# Patient Record
Sex: Male | Born: 1970 | Race: White | Hispanic: No | Smoking: Current every day smoker
Health system: Southern US, Community
[De-identification: ages and names within clinical notes are randomized; demographics above are authoritative.]

## PROBLEM LIST (undated history)

## (undated) DIAGNOSIS — F329 Major depressive disorder, single episode, unspecified: Secondary | ICD-10-CM

## (undated) DIAGNOSIS — I2510 Atherosclerotic heart disease of native coronary artery without angina pectoris: Secondary | ICD-10-CM

## (undated) DIAGNOSIS — I509 Heart failure, unspecified: Secondary | ICD-10-CM

## (undated) DIAGNOSIS — R0600 Dyspnea, unspecified: Secondary | ICD-10-CM

## (undated) DIAGNOSIS — R079 Chest pain, unspecified: Secondary | ICD-10-CM

## (undated) DIAGNOSIS — R519 Headache, unspecified: Secondary | ICD-10-CM

## (undated) DIAGNOSIS — E039 Hypothyroidism, unspecified: Secondary | ICD-10-CM

## (undated) DIAGNOSIS — I639 Cerebral infarction, unspecified: Secondary | ICD-10-CM

## (undated) DIAGNOSIS — G4730 Sleep apnea, unspecified: Secondary | ICD-10-CM

## (undated) DIAGNOSIS — I255 Ischemic cardiomyopathy: Secondary | ICD-10-CM

## (undated) DIAGNOSIS — Z720 Tobacco use: Secondary | ICD-10-CM

## (undated) DIAGNOSIS — R112 Nausea with vomiting, unspecified: Secondary | ICD-10-CM

## (undated) DIAGNOSIS — I3139 Other pericardial effusion (noninflammatory): Secondary | ICD-10-CM

## (undated) DIAGNOSIS — I10 Essential (primary) hypertension: Secondary | ICD-10-CM

## (undated) DIAGNOSIS — Z9889 Other specified postprocedural states: Secondary | ICD-10-CM

## (undated) DIAGNOSIS — K219 Gastro-esophageal reflux disease without esophagitis: Secondary | ICD-10-CM

## (undated) DIAGNOSIS — F32A Depression, unspecified: Secondary | ICD-10-CM

## (undated) DIAGNOSIS — E785 Hyperlipidemia, unspecified: Secondary | ICD-10-CM

## (undated) DIAGNOSIS — E559 Vitamin D deficiency, unspecified: Secondary | ICD-10-CM

## (undated) DIAGNOSIS — J45909 Unspecified asthma, uncomplicated: Secondary | ICD-10-CM

## (undated) DIAGNOSIS — I313 Pericardial effusion (noninflammatory): Secondary | ICD-10-CM

## (undated) DIAGNOSIS — Z8739 Personal history of other diseases of the musculoskeletal system and connective tissue: Secondary | ICD-10-CM

## (undated) DIAGNOSIS — Z87442 Personal history of urinary calculi: Secondary | ICD-10-CM

## (undated) DIAGNOSIS — E119 Type 2 diabetes mellitus without complications: Secondary | ICD-10-CM

## (undated) DIAGNOSIS — N200 Calculus of kidney: Secondary | ICD-10-CM

## (undated) DIAGNOSIS — R51 Headache: Secondary | ICD-10-CM

## (undated) HISTORY — DX: Vitamin D deficiency, unspecified: E55.9

## (undated) HISTORY — DX: Personal history of urinary calculi: Z87.442

## (undated) HISTORY — PX: KIDNEY STONE SURGERY: SHX686

## (undated) HISTORY — DX: Hyperlipidemia, unspecified: E78.5

## (undated) HISTORY — DX: Chest pain, unspecified: R07.9

## (undated) HISTORY — DX: Pericardial effusion (noninflammatory): I31.3

## (undated) HISTORY — PX: HERNIA REPAIR: SHX51

## (undated) HISTORY — DX: Gastro-esophageal reflux disease without esophagitis: K21.9

## (undated) HISTORY — PX: PERCUTANEOUS NEPHROLITHOTRIPSY: SHX2206

## (undated) HISTORY — DX: Hypothyroidism, unspecified: E03.9

## (undated) HISTORY — PX: APPENDECTOMY: SHX54

## (undated) HISTORY — DX: Personal history of other diseases of the musculoskeletal system and connective tissue: Z87.39

## (undated) HISTORY — DX: Other pericardial effusion (noninflammatory): I31.39

## (undated) HISTORY — DX: Cerebral infarction, unspecified: I63.9

## (undated) HISTORY — DX: Essential (primary) hypertension: I10

## (undated) HISTORY — PX: VARICOCELECTOMY: SHX1084

## (undated) HISTORY — DX: Ischemic cardiomyopathy: I25.5

## (undated) HISTORY — DX: Tobacco use: Z72.0

---

## 2000-06-20 ENCOUNTER — Encounter: Payer: Self-pay | Admitting: Internal Medicine

## 2000-06-20 ENCOUNTER — Ambulatory Visit (HOSPITAL_COMMUNITY): Admission: RE | Admit: 2000-06-20 | Discharge: 2000-06-20 | Payer: Self-pay | Admitting: Internal Medicine

## 2000-07-18 ENCOUNTER — Emergency Department (HOSPITAL_COMMUNITY): Admission: EM | Admit: 2000-07-18 | Discharge: 2000-07-18 | Payer: Self-pay | Admitting: Emergency Medicine

## 2006-05-11 ENCOUNTER — Ambulatory Visit: Payer: Self-pay | Admitting: Cardiology

## 2006-05-14 ENCOUNTER — Ambulatory Visit: Payer: Self-pay | Admitting: Cardiology

## 2012-01-15 DIAGNOSIS — K219 Gastro-esophageal reflux disease without esophagitis: Secondary | ICD-10-CM | POA: Insufficient documentation

## 2012-01-15 DIAGNOSIS — N179 Acute kidney failure, unspecified: Secondary | ICD-10-CM | POA: Diagnosis present

## 2012-01-15 DIAGNOSIS — M6282 Rhabdomyolysis: Secondary | ICD-10-CM | POA: Insufficient documentation

## 2012-01-15 DIAGNOSIS — E039 Hypothyroidism, unspecified: Secondary | ICD-10-CM | POA: Insufficient documentation

## 2012-03-13 DIAGNOSIS — N200 Calculus of kidney: Secondary | ICD-10-CM

## 2012-03-13 HISTORY — DX: Calculus of kidney: N20.0

## 2012-08-18 ENCOUNTER — Encounter: Payer: Self-pay | Admitting: Physician Assistant

## 2012-08-18 DIAGNOSIS — R079 Chest pain, unspecified: Secondary | ICD-10-CM

## 2012-08-19 ENCOUNTER — Encounter: Payer: Self-pay | Admitting: Physician Assistant

## 2012-08-19 ENCOUNTER — Encounter: Payer: Self-pay | Admitting: Cardiology

## 2012-08-19 DIAGNOSIS — R079 Chest pain, unspecified: Secondary | ICD-10-CM

## 2012-08-23 ENCOUNTER — Encounter: Payer: Self-pay | Admitting: Physician Assistant

## 2012-08-23 DIAGNOSIS — R079 Chest pain, unspecified: Secondary | ICD-10-CM

## 2012-08-27 ENCOUNTER — Telehealth: Payer: Self-pay | Admitting: Physician Assistant

## 2012-08-27 NOTE — Telephone Encounter (Signed)
Needs release to go to back to work.  Was told by [facility] nurse that he can not return to work until we release him

## 2012-08-28 ENCOUNTER — Encounter: Payer: Self-pay | Admitting: *Deleted

## 2012-08-28 NOTE — Telephone Encounter (Signed)
Notes Recorded by Lesle Chris, LPN on 1/61/0960 at 10:00 AM Patient notified. Rescheduled eph from 6/27 to 6/19 as patient is anxious to get back to work. Cancellation on PA schedule so able to move appt up. ------  Notes Recorded by Lesle Chris, LPN on 4/54/0981 at 2:17 PM Attempted to notify patient, unable to leave message. Has follow up OV scheduled for 6/27 with Gene Serpe, PA. ------  Notes Recorded by Prescott Parma, PA-C on 08/26/2012 at 12:06 PM Stress test intermediate risk. Needs to be reviewed at post hospital f/u OV

## 2012-08-29 ENCOUNTER — Encounter: Payer: Self-pay | Admitting: *Deleted

## 2012-08-29 ENCOUNTER — Encounter: Payer: Self-pay | Admitting: Physician Assistant

## 2012-08-29 ENCOUNTER — Ambulatory Visit (INDEPENDENT_AMBULATORY_CARE_PROVIDER_SITE_OTHER): Payer: BC Managed Care – PPO | Admitting: Physician Assistant

## 2012-08-29 VITALS — BP 121/81 | HR 72 | Ht 78.0 in | Wt 291.0 lb

## 2012-08-29 DIAGNOSIS — Z9189 Other specified personal risk factors, not elsewhere classified: Secondary | ICD-10-CM

## 2012-08-29 DIAGNOSIS — R9439 Abnormal result of other cardiovascular function study: Secondary | ICD-10-CM

## 2012-08-29 DIAGNOSIS — E782 Mixed hyperlipidemia: Secondary | ICD-10-CM | POA: Insufficient documentation

## 2012-08-29 DIAGNOSIS — I10 Essential (primary) hypertension: Secondary | ICD-10-CM | POA: Insufficient documentation

## 2012-08-29 DIAGNOSIS — F17210 Nicotine dependence, cigarettes, uncomplicated: Secondary | ICD-10-CM | POA: Insufficient documentation

## 2012-08-29 DIAGNOSIS — Z720 Tobacco use: Secondary | ICD-10-CM

## 2012-08-29 DIAGNOSIS — I3139 Other pericardial effusion (noninflammatory): Secondary | ICD-10-CM

## 2012-08-29 DIAGNOSIS — E785 Hyperlipidemia, unspecified: Secondary | ICD-10-CM

## 2012-08-29 DIAGNOSIS — I313 Pericardial effusion (noninflammatory): Secondary | ICD-10-CM | POA: Insufficient documentation

## 2012-08-29 DIAGNOSIS — R931 Abnormal findings on diagnostic imaging of heart and coronary circulation: Secondary | ICD-10-CM

## 2012-08-29 DIAGNOSIS — F17200 Nicotine dependence, unspecified, uncomplicated: Secondary | ICD-10-CM

## 2012-08-29 DIAGNOSIS — R079 Chest pain, unspecified: Secondary | ICD-10-CM

## 2012-08-29 DIAGNOSIS — Z8739 Personal history of other diseases of the musculoskeletal system and connective tissue: Secondary | ICD-10-CM | POA: Insufficient documentation

## 2012-08-29 DIAGNOSIS — I319 Disease of pericardium, unspecified: Secondary | ICD-10-CM

## 2012-08-29 NOTE — Assessment & Plan Note (Signed)
Following review of the recent, equivocal pharmacologic nuclear imaging result with Dr. Shirlee Latch, recommendation is as follows: patient will be referred for a cardiac CT angiogram with calcium score, at [facility]. We feel that this would be a better followup study to definitively exclude significant CAD, rather than subject the patient to a cardiac catheterization. This is in light of his atypical CP symptoms, but in the context of multiple CRFs, including ongoing tobacco smoking. If this study is negative, then no further workup is indicated and patient can return to our clinic on an as-needed basis. If, however, there is suggestion of significant CAD, then we will have him return to discuss proceeding with a cardiac catheterization. In the meanwhile, patient is cleared to return to work full-time, without restriction.

## 2012-08-29 NOTE — Patient Instructions (Signed)
   Coronary CTA - to be scheduled at [facility] will contact with results via phone or letter.   Continue all current medications. Patient may return to work.   Follow up as needed

## 2012-08-29 NOTE — Assessment & Plan Note (Signed)
Resolved, by recent echocardiogram

## 2012-08-29 NOTE — Progress Notes (Signed)
Primary Cardiologist: Rollene Rotunda, MD (new)   HPI: Post hospital followup from [facility], following recent presentation with atypical CP. Troponins NL. He presented with no known CAD, but with multiple CRFs, and had a negative stress echocardiogram in 2008.    - Echocardiogram, June 9: EF 50-55%; question inferobasal/inferoseptal HK; mild MR  Following this study, recommendation was to proceed with an outpatient stress test.   Marlane Hatcher, June 13: Intermediate risk study, namely based on calculated EF (EF 37%); physiologically inconsistent reversible basal anterior defect, also fixed inferoseptal defect at apex   He presents today reporting no recurrent severe CP, which led to his recent hospitalization. He has had, however, one singular episode of CP, but this was while riding on his tractor mower. It was minimally intense and persisted for about one hour. He did not use NTG. Of note, although he is on ranitidine, he states that he has heartburn "all the time". And as recently documented, he continues to deny any history of exertional CP.  Allergies  Allergen Reactions  . Crestor (Rosuvastatin) Other (See Comments)    rhabdomyolosis    Current Outpatient Prescriptions  Medication Sig Dispense Refill  . aspirin EC 81 MG tablet Take 81 mg by mouth daily.      . cholecalciferol (VITAMIN D) 1000 UNITS tablet Take 1,000 Units by mouth daily.      Marland Kitchen levothyroxine (SYNTHROID, LEVOTHROID) 200 MCG tablet Take 200 mcg by mouth daily before breakfast.      . ranitidine (ZANTAC) 75 MG tablet Take 150 mg by mouth at bedtime.       No current facility-administered medications for this visit.    Past Medical History  Diagnosis Date  . Hypertension   . Hyperlipidemia   . GERD (gastroesophageal reflux disease)   . Hypothyroidism   . History of rhabdomyolysis   . History of kidney stones   . Vitamin D deficiency   . Chest pain   . Tobacco abuse   . Pericardial effusion     Small, by  dobutamine echocardiogram, 04/2006    Past Surgical History  Procedure Laterality Date  . Varicocelectomy    . Percutaneous nephrolithotripsy      History   Social History  . Marital Status: Divorced    Spouse Name: N/A    Number of Children: N/A  . Years of Education: N/A   Occupational History  . MAINTENANCE TECHNICIAN     [employer] [place]   Social History Main Topics  . Smoking status: Current Every Day Smoker -- 0.50 packs/day for 26 years    Types: Cigarettes    Start date: 03/13/1985  . Smokeless tobacco: Former Neurosurgeon    Types: Snuff, Dorna Bloom    Quit date: 03/14/1987     Comment: started smoking at age 66. chewed and dipped for 2 years  . Alcohol Use: Not on file  . Drug Use: Not on file  . Sexually Active: Not on file   Other Topics Concern  . Not on file   Social History Narrative   Eats fast food daily   No exercise outside of work   Social History Narrative   Eats fast food daily   No exercise outside of work    Problem Relation Age of Onset  . Coronary artery disease Father   . Coronary artery disease Mother   . Coronary artery disease Maternal Grandfather     both grandfathers and several uncles  . Coronary artery disease    .  Hypertension Father   . Hyperlipidemia Father   . Diabetes Father   . Diabetes Mother   . Diabetes Sister     both sisters  . Hypertension Mother   . Hyperlipidemia Mother     ROS: no nausea, vomiting; no fever, chills; no melena, hematochezia; no claudication  PHYSICAL EXAM: BP 121/81  Pulse 72  Ht 6\' 6"  (1.981 m)  Wt 291 lb (131.997 kg)  BMI 33.64 kg/m2 GENERAL: 42 year-old male, obese; NAD HEENT: NCAT, PERRLA, EOMI; sclera clear; no xanthelasma NECK: palpable bilateral carotid pulses, no bruits; no JVD; no TM LUNGS: CTA bilaterally CARDIAC: RRR (S1, S2); no significant murmurs; no rubs or gallops ABDOMEN: soft, protuberant EXTREMETIES: no significant peripheral edema SKIN: warm/dry; no obvious  rash/lesions MUSCULOSKELETAL: no joint deformity NEURO: no focal deficit; NL affect   EKG:    ASSESSMENT & PLAN:  Chest pain Following review of the recent, equivocal pharmacologic nuclear imaging result with Dr. Shirlee Latch, recommendation is as follows: patient will be referred for a cardiac CT angiogram with calcium score, at [facility]. We feel that this would be a better followup study to definitively exclude significant CAD, rather than subject the patient to a cardiac catheterization. This is in light of his atypical CP symptoms, but in the context of multiple CRFs, including ongoing tobacco smoking. If this study is negative, then no further workup is indicated and patient can return to our clinic on an as-needed basis. If, however, there is suggestion of significant CAD, then we will have him return to discuss proceeding with a cardiac catheterization. In the meanwhile, patient is cleared to return to work full-time, without restriction.  Pericardial effusion Resolved, by recent echocardiogram    Gene Roza Creamer, PAC

## 2012-09-02 ENCOUNTER — Telehealth: Payer: Self-pay | Admitting: *Deleted

## 2012-09-02 NOTE — Telephone Encounter (Signed)
Left message for patient to call and schedule Cardiac CT.

## 2012-09-03 ENCOUNTER — Encounter: Payer: Self-pay | Admitting: *Deleted

## 2012-09-03 NOTE — Telephone Encounter (Signed)
Cardiac CT scheduled for 09/27/12 at 1 pm.

## 2012-09-06 ENCOUNTER — Ambulatory Visit: Payer: Self-pay | Admitting: Physician Assistant

## 2012-09-10 ENCOUNTER — Emergency Department (HOSPITAL_COMMUNITY): Payer: Worker's Compensation

## 2012-09-10 ENCOUNTER — Encounter (HOSPITAL_COMMUNITY): Payer: Self-pay | Admitting: *Deleted

## 2012-09-10 ENCOUNTER — Emergency Department (HOSPITAL_COMMUNITY)
Admission: EM | Admit: 2012-09-10 | Discharge: 2012-09-11 | Disposition: A | Discharge status: Home / Self Care | Payer: Worker's Compensation | Attending: Emergency Medicine | Admitting: Emergency Medicine

## 2012-09-10 DIAGNOSIS — Z8639 Personal history of other endocrine, nutritional and metabolic disease: Secondary | ICD-10-CM | POA: Insufficient documentation

## 2012-09-10 DIAGNOSIS — Z9189 Other specified personal risk factors, not elsewhere classified: Secondary | ICD-10-CM | POA: Insufficient documentation

## 2012-09-10 DIAGNOSIS — Z862 Personal history of diseases of the blood and blood-forming organs and certain disorders involving the immune mechanism: Secondary | ICD-10-CM | POA: Insufficient documentation

## 2012-09-10 DIAGNOSIS — E039 Hypothyroidism, unspecified: Secondary | ICD-10-CM | POA: Insufficient documentation

## 2012-09-10 DIAGNOSIS — K429 Umbilical hernia without obstruction or gangrene: Secondary | ICD-10-CM | POA: Insufficient documentation

## 2012-09-10 DIAGNOSIS — K219 Gastro-esophageal reflux disease without esophagitis: Secondary | ICD-10-CM | POA: Insufficient documentation

## 2012-09-10 DIAGNOSIS — Z79899 Other long term (current) drug therapy: Secondary | ICD-10-CM | POA: Insufficient documentation

## 2012-09-10 DIAGNOSIS — Z8679 Personal history of other diseases of the circulatory system: Secondary | ICD-10-CM | POA: Insufficient documentation

## 2012-09-10 DIAGNOSIS — Z87448 Personal history of other diseases of urinary system: Secondary | ICD-10-CM | POA: Insufficient documentation

## 2012-09-10 DIAGNOSIS — Z7982 Long term (current) use of aspirin: Secondary | ICD-10-CM | POA: Insufficient documentation

## 2012-09-10 DIAGNOSIS — I10 Essential (primary) hypertension: Secondary | ICD-10-CM | POA: Insufficient documentation

## 2012-09-10 DIAGNOSIS — F17200 Nicotine dependence, unspecified, uncomplicated: Secondary | ICD-10-CM | POA: Insufficient documentation

## 2012-09-10 HISTORY — PX: CARDIAC CATHETERIZATION: SHX172

## 2012-09-10 LAB — CBC WITH DIFFERENTIAL/PLATELET
Basophils Relative: 0 % (ref 0–1)
Eosinophils Absolute: 0.2 10*3/uL (ref 0.0–0.7)
Eosinophils Relative: 2 % (ref 0–5)
HCT: 45.3 % (ref 39.0–52.0)
Lymphocytes Relative: 28 % (ref 12–46)
Lymphs Abs: 3 10*3/uL (ref 0.7–4.0)
MCV: 88.3 fL (ref 78.0–100.0)
Monocytes Relative: 6 % (ref 3–12)
Neutro Abs: 6.6 10*3/uL (ref 1.7–7.7)
Platelets: 258 10*3/uL (ref 150–400)
RBC: 5.13 MIL/uL (ref 4.22–5.81)
RDW: 15.3 % (ref 11.5–15.5)
WBC: 10.5 10*3/uL (ref 4.0–10.5)

## 2012-09-10 LAB — COMPREHENSIVE METABOLIC PANEL
Albumin: 4 g/dL (ref 3.5–5.2)
Alkaline Phosphatase: 103 U/L (ref 39–117)
BUN: 16 mg/dL (ref 6–23)
Calcium: 10.1 mg/dL (ref 8.4–10.5)
Creatinine, Ser: 1.26 mg/dL (ref 0.50–1.35)
Potassium: 3.6 mEq/L (ref 3.5–5.1)
Total Protein: 7.7 g/dL (ref 6.0–8.3)

## 2012-09-10 MED ORDER — IOHEXOL 300 MG/ML  SOLN
100.0000 mL | Freq: Once | INTRAMUSCULAR | Status: AC | PRN
  Administered 2012-09-10: 100 mL via INTRAVENOUS

## 2012-09-10 MED ORDER — HYDROCODONE-ACETAMINOPHEN 5-325 MG PO TABS: 1.0000 | ORAL_TABLET | Freq: Four times a day (QID) | ORAL | Status: DC | PRN

## 2012-09-10 MED ORDER — HYDROMORPHONE HCL PF 1 MG/ML IJ SOLN
1.0000 mg | Freq: Once | INTRAMUSCULAR | Status: AC
  Administered 2012-09-10: 1 mg via INTRAVENOUS
  Filled 2012-09-10: qty 1

## 2012-09-10 MED ORDER — IOHEXOL 300 MG/ML  SOLN
50.0000 mL | Freq: Once | INTRAMUSCULAR | Status: AC | PRN
  Administered 2012-09-10: 50 mL via ORAL

## 2012-09-10 MED ORDER — ONDANSETRON HCL 4 MG/2ML IJ SOLN
4.0000 mg | Freq: Once | INTRAMUSCULAR | Status: AC
  Administered 2012-09-10: 4 mg via INTRAVENOUS
  Filled 2012-09-10: qty 2

## 2012-09-10 NOTE — ED Notes (Signed)
Pt reports lifting and feeling a pull in abdomen.  Reporting burning pain around umbilicus, also reporting a knot has developed.

## 2012-09-10 NOTE — ED Provider Notes (Signed)
History     This chart was scribed for Devin Lennert, MD, MD by Smitty Pluck, ED Scribe. The patient was seen in room APA10/APA10 and the patient's care was started at 8:17 PM.  CSN: [id number] Arrival date & time 09/10/12  1914    Chief Complaint  Patient presents with  . Abdominal Pain   Patient is a 42 y.o. male presenting with abdominal pain. The history is provided by the patient and medical records. No language interpreter was used.  Abdominal Pain This is a new problem. The current episode started 1 to 2 hours ago. The problem occurs constantly. The problem has not changed since onset.Associated symptoms include abdominal pain. Pertinent negatives include no chest pain and no headaches. The symptoms are aggravated by bending and twisting. Nothing relieves the symptoms. He has tried nothing for the symptoms.   HPI Comments: [name]. is a 42 y.o. male who presents to the Emergency Department complaining of constant, moderate, burning, lower abdominal pain onset today. Pt reports that he was working and lifted a heavy object and felt pulling sensation in abdomen. He states he feels there is a a knot on his abdomen. Pt denies fever, chills, nausea, vomiting, diarrhea, weakness, cough, SOB and any other pain.   Past Medical History  Diagnosis Date  . Hypertension   . Hyperlipidemia   . GERD (gastroesophageal reflux disease)   . Hypothyroidism   . History of rhabdomyolysis   . History of kidney stones   . Vitamin D deficiency   . Chest pain   . Tobacco abuse   . Pericardial effusion     Small, by dobutamine echocardiogram, 04/2006   Past Surgical History  Procedure Laterality Date  . Varicocelectomy    . Percutaneous nephrolithotripsy     Family History  Problem Relation Age of Onset  . Coronary artery disease Father   . Coronary artery disease Mother   . Coronary artery disease Maternal Grandfather     both grandfathers and several uncles  . Coronary artery  disease    . Hypertension Father   . Hyperlipidemia Father   . Diabetes Father   . Diabetes Mother   . Diabetes Sister     both sisters  . Hypertension Mother   . Hyperlipidemia Mother    History  Substance Use Topics  . Smoking status: Current Every Day Smoker -- 0.50 packs/day for 26 years    Types: Cigarettes    Start date: 03/13/1985  . Smokeless tobacco: Former Neurosurgeon    Types: Snuff, Dorna Bloom    Quit date: 03/14/1987     Comment: started smoking at age 41. chewed and dipped for 2 years  . Alcohol Use: No    Review of Systems  Constitutional: Negative for appetite change and fatigue.  HENT: Negative for congestion, sinus pressure and ear discharge.   Eyes: Negative for discharge.  Respiratory: Negative for cough.   Cardiovascular: Negative for chest pain.  Gastrointestinal: Positive for abdominal pain. Negative for diarrhea.  Genitourinary: Negative for frequency and hematuria.  Musculoskeletal: Negative for back pain.  Skin: Negative for rash.  Neurological: Negative for seizures and headaches.  Psychiatric/Behavioral: Negative for hallucinations.    Allergies  Crestor  Home Medications   Current Outpatient Rx  Name  Route  Sig  Dispense  Refill  . aspirin EC 81 MG tablet   Oral   Take 81 mg by mouth daily.         . cholecalciferol (VITAMIN  D) 1000 UNITS tablet   Oral   Take 1,000 Units by mouth daily.         Marland Kitchen levothyroxine (SYNTHROID, LEVOTHROID) 200 MCG tablet   Oral   Take 200 mcg by mouth daily before breakfast.         . ranitidine (ZANTAC) 75 MG tablet   Oral   Take 150 mg by mouth at bedtime.          BP 154/109  Pulse 91  Temp(Src) 98.7 F (37.1 C) (Oral)  Resp 20  Ht 6\' 6"  (1.981 m)  Wt 300 lb (136.079 kg)  BMI 34.68 kg/m2  SpO2 98% Physical Exam  Nursing note and vitals reviewed. Constitutional: He is oriented to person, place, and time. He appears well-developed.  HENT:  Head: Normocephalic.  Eyes: Conjunctivae and EOM  are normal. No scleral icterus.  Neck: Neck supple. No thyromegaly present.  Cardiovascular: Normal rate and regular rhythm.  Exam reveals no gallop and no friction rub.   No murmur heard. Pulmonary/Chest: No stridor. He has no wheezes. He has no rales. He exhibits no tenderness.  Abdominal: He exhibits no distension. There is tenderness. There is no rebound.  Musculoskeletal: Normal range of motion. He exhibits no edema.  Lymphadenopathy:    He has no cervical adenopathy.  Neurological: He is oriented to person, place, and time. Coordination normal.  Skin: No rash noted. No erythema.  Psychiatric: He has a normal mood and affect. His behavior is normal.    ED Course  Procedures (including critical care time) DIAGNOSTIC STUDIES: Oxygen Saturation is 98% on room air, normal by my interpretation.    COORDINATION OF CARE: 8:20 PM Discussed ED treatment with pt and pt agrees.  Medications  HYDROmorphone (DILAUDID) injection 1 mg (1 mg Intravenous Given 09/10/12 2142)  ondansetron (ZOFRAN) injection 4 mg (4 mg Intravenous Given 09/10/12 2142)  iohexol (OMNIPAQUE) 300 MG/ML solution 50 mL (50 mLs Oral Contrast Given 09/10/12 2053)  iohexol (OMNIPAQUE) 300 MG/ML solution 100 mL (100 mLs Intravenous Contrast Given 09/10/12 2153)    10:45 PM Recheck: Discussed lab results and treatment course with pt. Pt is ready for discharge.    Labs Reviewed  COMPREHENSIVE METABOLIC PANEL - Abnormal; Notable for the following:    Glucose, Bld 113 (*)    GFR calc non Af Amer 70 (*)    GFR calc Af Amer 81 (*)    All other components within normal limits  CBC WITH DIFFERENTIAL   Ct Abdomen Pelvis W Contrast  09/10/2012   *RADIOLOGY REPORT*  Clinical Data: Periumbilical pain after lifting.  CT ABDOMEN AND PELVIS WITH CONTRAST  Technique:  Multidetector CT imaging of the abdomen and pelvis was performed following the standard protocol during bolus administration of intravenous contrast.  Contrast: 50mL OMNIPAQUE  IOHEXOL 300 MG/ML  SOLN, OMNIPAQUE IOHEXOL 300 MG/ML  SOLN  Comparison: None.  Findings: Bibasilar atelectasis is noted.  There is no pleural or pericardial effusion.  The liver is diffusely low attenuating consistent with fatty infiltration.  No focal liver lesion is identified.  Stones and sludge are seen within the gallbladder but there is no evidence of acute cholecystitis by CT scan.  The liver, adrenal glands, pancreas and kidneys appear normal.  The patient has a very small fat containing umbilical hernia.  The stomach, small and large bowel and appendix all appear normal.  No focal bony abnormality is identified.  IMPRESSION:  1.  No acute finding. 2.  Fatty infiltration of  the liver. 3.  Gallstones and gallbladder sludge. 4.  Very small fat containing umbilical hernia.   Original Report Authenticated By: Holley Dexter, M.D.   No diagnosis found.  MDM  The chart was scribed for me under my direct supervision.  I personally performed the history, physical, and medical decision making and all procedures in the evaluation of this patient.Devin Lennert, MD 09/10/12 2249

## 2012-09-19 ENCOUNTER — Encounter (HOSPITAL_COMMUNITY): Payer: Self-pay | Admitting: Emergency Medicine

## 2012-09-19 ENCOUNTER — Emergency Department (HOSPITAL_COMMUNITY)
Admission: EM | Admit: 2012-09-19 | Discharge: 2012-09-19 | Disposition: A | Discharge status: Home / Self Care | Payer: BC Managed Care – PPO | Attending: Emergency Medicine | Admitting: Emergency Medicine

## 2012-09-19 DIAGNOSIS — Z87891 Personal history of nicotine dependence: Secondary | ICD-10-CM | POA: Insufficient documentation

## 2012-09-19 DIAGNOSIS — Z8679 Personal history of other diseases of the circulatory system: Secondary | ICD-10-CM | POA: Insufficient documentation

## 2012-09-19 DIAGNOSIS — IMO0001 Reserved for inherently not codable concepts without codable children: Secondary | ICD-10-CM | POA: Insufficient documentation

## 2012-09-19 DIAGNOSIS — K429 Umbilical hernia without obstruction or gangrene: Secondary | ICD-10-CM

## 2012-09-19 DIAGNOSIS — Z79899 Other long term (current) drug therapy: Secondary | ICD-10-CM | POA: Insufficient documentation

## 2012-09-19 DIAGNOSIS — R110 Nausea: Secondary | ICD-10-CM | POA: Insufficient documentation

## 2012-09-19 DIAGNOSIS — I10 Essential (primary) hypertension: Secondary | ICD-10-CM | POA: Insufficient documentation

## 2012-09-19 DIAGNOSIS — K219 Gastro-esophageal reflux disease without esophagitis: Secondary | ICD-10-CM | POA: Insufficient documentation

## 2012-09-19 DIAGNOSIS — E039 Hypothyroidism, unspecified: Secondary | ICD-10-CM | POA: Insufficient documentation

## 2012-09-19 DIAGNOSIS — E559 Vitamin D deficiency, unspecified: Secondary | ICD-10-CM | POA: Insufficient documentation

## 2012-09-19 DIAGNOSIS — E785 Hyperlipidemia, unspecified: Secondary | ICD-10-CM | POA: Insufficient documentation

## 2012-09-19 DIAGNOSIS — R109 Unspecified abdominal pain: Secondary | ICD-10-CM | POA: Insufficient documentation

## 2012-09-19 DIAGNOSIS — Z87442 Personal history of urinary calculi: Secondary | ICD-10-CM | POA: Insufficient documentation

## 2012-09-19 MED ORDER — ONDANSETRON HCL 4 MG PO TABS: 4.0000 mg | ORAL_TABLET | Freq: Four times a day (QID) | ORAL | Status: DC

## 2012-09-19 MED ORDER — METHOCARBAMOL 500 MG PO TABS: 500.0000 mg | ORAL_TABLET | Freq: Three times a day (TID) | ORAL | Status: DC

## 2012-09-19 MED ORDER — HYDROCODONE-ACETAMINOPHEN 7.5-325 MG PO TABS: 1.0000 | ORAL_TABLET | ORAL | Status: DC | PRN

## 2012-09-19 NOTE — ED Notes (Signed)
Pt is alert, and NAD, has already been examined by Loney Laurence, PA.  Given gingerale to drink.

## 2012-09-19 NOTE — ED Provider Notes (Signed)
History    CSN: [id number] Arrival date & time 09/19/12  4098  First MD Initiated Contact with Patient 09/19/12 1951     Chief Complaint  Patient presents with  . Hernia   (Consider location/radiation/quality/duration/timing/severity/associated sxs/prior Treatment) HPI Comments: Devin [name] is a 42 year old patient who presents to the emergency department with complaint of abdominal hernia. The patient states he does a lot of heavy lifting, and on or about July 1 he felt a pulling sensation followed by a knot in his abdomen and nausea. The patient was evaluated with a CT scan in the emergency department there were no acute findings appreciated. The patient was referred to surgery and has an appointment to see the surgeon on July 15. The patient states he presents tonight because he is having pain particularly with standing and with working. He states that he is nauseated at times. There's been no fever. There's been no passing of blood in the stool or vomiting blood. The patient is able to even drink, but states that it" is rough going down".  The history is provided by the patient.   Past Medical History  Diagnosis Date  . Hypertension   . Hyperlipidemia   . GERD (gastroesophageal reflux disease)   . Hypothyroidism   . History of rhabdomyolysis   . History of kidney stones   . Vitamin D deficiency   . Chest pain   . Tobacco abuse   . Pericardial effusion     Small, by dobutamine echocardiogram, 04/2006   Past Surgical History  Procedure Laterality Date  . Varicocelectomy    . Percutaneous nephrolithotripsy     Family History  Problem Relation Age of Onset  . Coronary artery disease Father   . Coronary artery disease Mother   . Coronary artery disease Maternal Grandfather     both grandfathers and several uncles  . Coronary artery disease    . Hypertension Father   . Hyperlipidemia Father   . Diabetes Father   . Diabetes Mother   . Diabetes Sister     both sisters  .  Hypertension Mother   . Hyperlipidemia Mother    History  Substance Use Topics  . Smoking status: Current Every Day Smoker -- 0.50 packs/day for 26 years    Types: Cigarettes    Start date: 03/13/1985  . Smokeless tobacco: Former Neurosurgeon    Types: Snuff, Dorna Bloom    Quit date: 03/14/1987     Comment: started smoking at age 75. chewed and dipped for 2 years  . Alcohol Use: No    Review of Systems  Constitutional: Negative for activity change.       All ROS Neg except as noted in HPI  HENT: Negative for nosebleeds and neck pain.   Eyes: Negative for photophobia and discharge.  Respiratory: Negative for cough, shortness of breath and wheezing.   Cardiovascular: Negative for chest pain and palpitations.  Gastrointestinal: Positive for abdominal pain. Negative for blood in stool.  Genitourinary: Negative for dysuria, frequency and hematuria.  Musculoskeletal: Positive for myalgias. Negative for back pain and arthralgias.  Skin: Negative.   Neurological: Negative for dizziness, seizures and speech difficulty.  Psychiatric/Behavioral: Negative for hallucinations and confusion.    Allergies  Crestor  Home Medications   Current Outpatient Rx  Name  Route  Sig  Dispense  Refill  . Cholecalciferol (VITAMIN D) 2000 UNITS CAPS   Oral   Take 1 capsule by mouth every morning.         Marland Kitchen  levothyroxine (SYNTHROID, LEVOTHROID) 200 MCG tablet   Oral   Take 200 mcg by mouth daily before breakfast.         . ranitidine (ZANTAC) 150 MG tablet   Oral   Take 150-300 mg by mouth daily.         Marland Kitchen HYDROcodone-acetaminophen (NORCO) 7.5-325 MG per tablet   Oral   Take 1 tablet by mouth every 4 (four) hours as needed for pain.   20 tablet   0   . methocarbamol (ROBAXIN) 500 MG tablet   Oral   Take 1 tablet (500 mg total) by mouth 3 (three) times daily.   21 tablet   0   . ondansetron (ZOFRAN) 4 MG tablet   Oral   Take 1 tablet (4 mg total) by mouth every 6 (six) hours.   12 tablet    0    BP 145/80  Pulse 89  Temp(Src) 98.7 F (37.1 C) (Oral)  Resp 20  Ht 6\' 6"  (1.981 m)  Wt 300 lb (136.079 kg)  BMI 34.68 kg/m2  SpO2 98% Physical Exam  Nursing note and vitals reviewed. Constitutional: He is oriented to person, place, and time. He appears well-developed and well-nourished.  Non-toxic appearance.  HENT:  Head: Normocephalic.  Right Ear: Tympanic membrane and external ear normal.  Left Ear: Tympanic membrane and external ear normal.  Eyes: EOM and lids are normal. Pupils are equal, round, and reactive to light.  Neck: Normal range of motion. Neck supple. Carotid bruit is not present.  Cardiovascular: Normal rate, regular rhythm, normal heart sounds, intact distal pulses and normal pulses.   Pulmonary/Chest: Breath sounds normal. No respiratory distress.  Abdominal: Soft. Bowel sounds are normal. There is no tenderness. There is no guarding.  There is a small palpable umbilical hernia with the patient standing or when he is straining. The hernia easily falls back into place the patient is lying supine. Bowel sounds are present and active. The abdomen is soft and flat on.  Musculoskeletal: Normal range of motion.  Lymphadenopathy:       Head (right side): No submandibular adenopathy present.       Head (left side): No submandibular adenopathy present.    He has no cervical adenopathy.  Neurological: He is alert and oriented to person, place, and time. He has normal strength. No cranial nerve deficit or sensory deficit.  Skin: Skin is warm and dry.  Psychiatric: He has a normal mood and affect. His speech is normal.    ED Course  Procedures (including critical care time) Labs Reviewed - No data to display No results found. 1. Umbilical hernia     MDM  I have reviewed nursing notes, vital signs, and all appropriate lab and imaging results for this patient. Patient was given a fluid challenge orally here in the emergency department and did not have any  vomiting. The hernia falls back in place with the patient is supine.  The patient has been advised to refrain from doing any heavy lifting or straining. He is encouraged to keep his surgical appointment on July 15. He is given a prescription for Zofran, Robaxin, and Norco 7.5 mg #20 tablets. Patient given instructions on knee to return for any emergent changes in his status or condition.  Kathie Dike, PA-C 09/19/12 2035

## 2012-09-19 NOTE — ED Notes (Signed)
Patient states he was injured at work about 2 weeks ago and was seen here and diagnosed with umbilical hernia.  States has appointment scheduled with surgeon for 09/24/12.  Patient states pain has gotten worse today and he has been nauseated.

## 2012-09-20 NOTE — ED Provider Notes (Signed)
Medical screening examination/treatment/procedure(s) were performed by non-physician practitioner and as supervising physician I was immediately available for consultation/collaboration.   Hava Massingale M Latysha Thackston, DO 09/20/12 1403 

## 2012-09-27 ENCOUNTER — Ambulatory Visit (HOSPITAL_COMMUNITY): Admission: RE | Admit: 2012-09-27 | Payer: BC Managed Care – PPO | Source: Ambulatory Visit

## 2012-10-03 ENCOUNTER — Encounter (HOSPITAL_COMMUNITY): Payer: Self-pay

## 2012-10-03 ENCOUNTER — Ambulatory Visit (HOSPITAL_COMMUNITY)
Admission: RE | Admit: 2012-10-03 | Discharge: 2012-10-03 | Disposition: A | Discharge status: Home / Self Care | Payer: BC Managed Care – PPO | Source: Ambulatory Visit | Attending: Physician Assistant | Admitting: Physician Assistant

## 2012-10-03 DIAGNOSIS — R931 Abnormal findings on diagnostic imaging of heart and coronary circulation: Secondary | ICD-10-CM

## 2012-10-03 DIAGNOSIS — I2510 Atherosclerotic heart disease of native coronary artery without angina pectoris: Secondary | ICD-10-CM | POA: Insufficient documentation

## 2012-10-03 DIAGNOSIS — R079 Chest pain, unspecified: Secondary | ICD-10-CM

## 2012-10-03 DIAGNOSIS — R0789 Other chest pain: Secondary | ICD-10-CM | POA: Insufficient documentation

## 2012-10-03 HISTORY — DX: Unspecified asthma, uncomplicated: J45.909

## 2012-10-03 MED ORDER — NITROGLYCERIN 0.4 MG SL SUBL
0.4000 mg | SUBLINGUAL_TABLET | SUBLINGUAL | Status: DC | PRN
  Administered 2012-10-03: 0.4 mg via SUBLINGUAL
  Filled 2012-10-03: qty 25

## 2012-10-03 MED ORDER — NITROGLYCERIN 0.4 MG SL SUBL
SUBLINGUAL_TABLET | SUBLINGUAL | Status: AC
  Filled 2012-10-03: qty 25

## 2012-10-03 MED ORDER — METOPROLOL TARTRATE 1 MG/ML IV SOLN
5.0000 mg | INTRAVENOUS | Status: DC | PRN
  Filled 2012-10-03: qty 5

## 2012-10-03 MED ORDER — DEXTROSE 5 % IV SOLN: INTRAVENOUS | Status: DC

## 2012-10-03 MED ORDER — METOPROLOL TARTRATE 1 MG/ML IV SOLN
INTRAVENOUS | Status: AC
  Administered 2012-10-03: 5 mg
  Filled 2012-10-03: qty 10

## 2012-10-03 MED ORDER — SODIUM BICARBONATE BOLUS VIA INFUSION: INTRAVENOUS | Status: DC

## 2012-10-03 MED ORDER — IOHEXOL 350 MG/ML SOLN
80.0000 mL | Freq: Once | INTRAVENOUS | Status: AC | PRN
  Administered 2012-10-03: 80 mL via INTRAVENOUS

## 2012-10-07 ENCOUNTER — Encounter: Payer: Self-pay | Admitting: *Deleted

## 2012-10-07 ENCOUNTER — Telehealth: Payer: Self-pay | Admitting: *Deleted

## 2012-10-07 NOTE — Telephone Encounter (Signed)
Order faxed to [facility] @ [phone] for labs to be drawn. Spoke with pt, he will go by there today. Dr Eden Emms made aware so orders can be completed.

## 2012-10-07 NOTE — Telephone Encounter (Signed)
Message copied by Freddi Starr on Mon Oct 07, 2012 10:02 AM ------      Message from: Wendall Stade      Created: Sun Oct 06, 2012  5:05 PM       Needs to be set up for cath this week hopefully with me if possible            ----- Message -----         From: Rollene Rotunda, MD         Sent: 10/04/2012   4:37 PM           To: Wendall Stade, MD            Cindee Lame,  I spoke to this patient.  Will you be setting up the cath?  Thanks.         ------

## 2012-10-07 NOTE — Telephone Encounter (Signed)
Left message for pt to call to discuss cath.

## 2012-10-07 NOTE — Telephone Encounter (Signed)
Spoke with pt, he is scheduled for cardiac cath Wednesday 10-09-12 with dr Eden Emms. Instruction sheet placed in medical records to be faxed to him at his job, [phone].

## 2012-10-07 NOTE — Telephone Encounter (Signed)
Follow up  Pt returning your call, pt said when you call back, his job will have to page him to come off the floor it could take about 5  Minutes.

## 2012-10-08 ENCOUNTER — Other Ambulatory Visit: Payer: Self-pay | Admitting: Cardiovascular Disease

## 2012-10-08 ENCOUNTER — Other Ambulatory Visit: Payer: Self-pay | Admitting: *Deleted

## 2012-10-08 ENCOUNTER — Telehealth: Payer: Self-pay | Admitting: Cardiovascular Disease

## 2012-10-08 DIAGNOSIS — I3139 Other pericardial effusion (noninflammatory): Secondary | ICD-10-CM

## 2012-10-08 DIAGNOSIS — R079 Chest pain, unspecified: Secondary | ICD-10-CM

## 2012-10-08 DIAGNOSIS — I313 Pericardial effusion (noninflammatory): Secondary | ICD-10-CM

## 2012-10-08 DIAGNOSIS — Z01810 Encounter for preprocedural cardiovascular examination: Secondary | ICD-10-CM

## 2012-10-08 NOTE — Telephone Encounter (Signed)
No answer at work phone. Left message on private cell phone that medical records refaxed the order to [facility] at [phone] and [phone] and that he needs to go back today for lab work since his heart cath is scheduled for tomorrow.

## 2012-10-08 NOTE — Telephone Encounter (Signed)
New problem     Pt was informed to go more head on yesterday per Victorio Palm. To have lab drawn.  Patient went was told that the order was not there.

## 2012-10-09 ENCOUNTER — Encounter (HOSPITAL_BASED_OUTPATIENT_CLINIC_OR_DEPARTMENT_OTHER): Payer: Self-pay

## 2012-10-09 ENCOUNTER — Encounter (HOSPITAL_BASED_OUTPATIENT_CLINIC_OR_DEPARTMENT_OTHER)
Admission: RE | Disposition: A | Discharge status: Home / Self Care | Payer: Self-pay | Source: Ambulatory Visit | Attending: Cardiovascular Disease

## 2012-10-09 ENCOUNTER — Inpatient Hospital Stay (HOSPITAL_BASED_OUTPATIENT_CLINIC_OR_DEPARTMENT_OTHER)
Admission: RE | Admit: 2012-10-09 | Discharge: 2012-10-09 | Disposition: A | Discharge status: Home / Self Care | Payer: BC Managed Care – PPO | Source: Ambulatory Visit | Attending: Cardiovascular Disease | Admitting: Cardiovascular Disease

## 2012-10-09 DIAGNOSIS — I2510 Atherosclerotic heart disease of native coronary artery without angina pectoris: Secondary | ICD-10-CM

## 2012-10-09 DIAGNOSIS — R9439 Abnormal result of other cardiovascular function study: Secondary | ICD-10-CM | POA: Insufficient documentation

## 2012-10-09 DIAGNOSIS — R079 Chest pain, unspecified: Secondary | ICD-10-CM | POA: Insufficient documentation

## 2012-10-09 DIAGNOSIS — I10 Essential (primary) hypertension: Secondary | ICD-10-CM | POA: Insufficient documentation

## 2012-10-09 SURGERY — JV LEFT HEART CATHETERIZATION WITH CORONARY ANGIOGRAM

## 2012-10-09 MED ORDER — SODIUM CHLORIDE 0.45 % IV SOLN: INTRAVENOUS | Status: DC

## 2012-10-09 MED ORDER — ACETAMINOPHEN 325 MG PO TABS: 650.0000 mg | ORAL_TABLET | ORAL | Status: DC | PRN

## 2012-10-09 MED ORDER — SODIUM CHLORIDE 0.9 % IV SOLN: 250.0000 mL | INTRAVENOUS | Status: DC | PRN

## 2012-10-09 MED ORDER — SODIUM CHLORIDE 0.9 % IJ SOLN: 3.0000 mL | Freq: Two times a day (BID) | INTRAMUSCULAR | Status: DC

## 2012-10-09 MED ORDER — SODIUM CHLORIDE 0.9 % IJ SOLN: 3.0000 mL | INTRAMUSCULAR | Status: DC | PRN

## 2012-10-09 MED ORDER — DIAZEPAM 5 MG PO TABS: 5.0000 mg | ORAL_TABLET | ORAL | Status: DC

## 2012-10-09 MED ORDER — ASPIRIN EC 325 MG PO TBEC: 325.0000 mg | DELAYED_RELEASE_TABLET | Freq: Every day | ORAL | Status: DC

## 2012-10-09 MED ORDER — ONDANSETRON HCL 4 MG/2ML IJ SOLN: 4.0000 mg | Freq: Four times a day (QID) | INTRAMUSCULAR | Status: DC | PRN

## 2012-10-09 MED ORDER — OXYCODONE-ACETAMINOPHEN 5-325 MG PO TABS: 1.0000 | ORAL_TABLET | ORAL | Status: DC | PRN

## 2012-10-09 MED ORDER — ASPIRIN 81 MG PO CHEW: 324.0000 mg | CHEWABLE_TABLET | ORAL | Status: DC

## 2012-10-09 NOTE — OR Nursing (Signed)
Dr Nishan at bedside to discuss results and treatment plan with pt and family 

## 2012-10-09 NOTE — OR Nursing (Signed)
Discharge instructions reviewed and signed, pt stated understanding, ambulated in hall without difficulty, site level 0, transported to sister's car via wheelchair

## 2012-10-09 NOTE — OR Nursing (Signed)
+  Allen's test right hand 

## 2012-10-09 NOTE — H&P (Signed)
Physician History and Physical  Patient ID: Devin [name]. MRN: [id number] DOB/AGE: 1970/08/15 41 y.o. Admit date: 10/09/2012  Primary Care Physician: Ernestine Conrad, MD Primary Cardiologist:  Hochrein  Active Problems:   * No active hospital problems. *   HPI:   42 yo with multiple CRF; s including smoking  Had SSCP and evaluated by Dr Antoine Poche.    Marlane Hatcher, June 13: Intermediate risk study, namely based on calculated EF (EF 37%); physiologically inconsistent reversible basal anterior defect, also fixed inferoseptal defect at apex  Subsequently referred by [facility] for cardiac CT  This showed extensive LAD calcification with 50% or greater proximal and mid vessel disease.    Given pain , ER visit and abnormal studies with risk factors cath recommened  . Of note, although he is on ranitidine, he states that he has heartburn "all the time". And as recently documented, he continues to deny any history of exertional CP.   Review of systems complete and found to be negative unless listed above   Past Medical History  Diagnosis Date  . Hypertension   . Hyperlipidemia   . GERD (gastroesophageal reflux disease)   . Hypothyroidism   . History of rhabdomyolysis   . History of kidney stones   . Vitamin D deficiency   . Chest pain   . Tobacco abuse   . Pericardial effusion     Small, by dobutamine echocardiogram, 04/2006  . Asthma     Family History  Problem Relation Age of Onset  . Coronary artery disease Father   . Coronary artery disease Mother   . Coronary artery disease Maternal Grandfather     both grandfathers and several uncles  . Coronary artery disease    . Hypertension Father   . Hyperlipidemia Father   . Diabetes Father   . Diabetes Mother   . Diabetes Sister     both sisters  . Hypertension Mother   . Hyperlipidemia Mother     History   Social History  . Marital Status: Divorced    Spouse Name: N/A    Number of Children: N/A  . Years of  Education: N/A   Occupational History  . 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 Smoking status: Current Every Day Smoker -- 0.50 packs/day for 26 years    Types: Cigarettes    Start date: 03/13/1985  . Smokeless tobacco: Former Neurosurgeon    Types: Snuff, Dorna Bloom    Quit date: 03/14/1987     Comment: started smoking at age 57. chewed and dipped for 2 years  . Alcohol Use: No  . Drug Use: No  . Sexually Active: Not on file   Other Topics Concern  . Not on file   Social History Narrative   Eats fast food daily   No exercise outside of work    Past Surgical History  Procedure Laterality Date  . Varicocelectomy    . Percutaneous nephrolithotripsy       Prescriptions prior to admission  Medication Sig Dispense Refill  . Cholecalciferol (VITAMIN D) 2000 UNITS CAPS Take 1 capsule by mouth every morning.      Marland Kitchen levothyroxine (SYNTHROID, LEVOTHROID) 200 MCG tablet Take 200 mcg by mouth daily before breakfast.      . HYDROcodone-acetaminophen (NORCO) 7.5-325 MG per tablet Take 1 tablet by mouth every 4 (four) hours as needed for pain.  20 tablet  0  . methocarbamol (  ROBAXIN) 500 MG tablet Take 1 tablet (500 mg total) by mouth 3 (three) times daily.  21 tablet  0  . ondansetron (ZOFRAN) 4 MG tablet Take 1 tablet (4 mg total) by mouth every 6 (six) hours.  12 tablet  0  . ranitidine (ZANTAC) 150 MG tablet Take 150-300 mg by mouth daily.        Physical Exam: Blood pressure 154/96, height 6\' 6"  (1.981 m), weight 300 lb (136.079 kg), SpO2 91.00%. Affect appropriate Healthy:  appears stated age HEENT: normal Neck supple with no adenopathy JVP normal no bruits no thyromegaly Lungs clear with no wheezing and good diaphragmatic motion Heart:  S1/S2 no murmur, no rub, gallop or click PMI normal Abdomen: benighn, BS positve, no tenderness, no AAA no bruit.  No HSM or HJR Distal pulses intact with no bruits No edema Neuro non-focal Skin  warm and dry No muscular weakness   Labs:   Lab Results  Component Value Date   WBC 10.5 09/10/2012   HGB 15.3 09/10/2012   HCT 45.3 09/10/2012   MCV 88.3 09/10/2012   PLT 258 09/10/2012   No results found for this basename: NA, K, CL, CO2, BUN, CREATININE, CALCIUM, LABALBU, PROT, BILITOT, ALKPHOS, ALT, AST, GLUCOSE,  in the last 168 hours No results found for this basename: CKTOTAL, CKMB, CKMBINDEX, TROPONINI    No results found for this basename: CHOL   No results found for this basename: HDL   No results found for this basename: LDLCALC   No results found for this basename: TRIG   No results found for this basename: CHOLHDL   No results found for this basename: LDLDIRECT      Radiology: Ct Abdomen Pelvis W Contrast  09/10/2012   *RADIOLOGY REPORT*  Clinical Data: Periumbilical pain after lifting.  CT ABDOMEN AND PELVIS WITH CONTRAST  Technique:  Multidetector CT imaging of the abdomen and pelvis was performed following the standard protocol during bolus administration of intravenous contrast.  Contrast: 50mL OMNIPAQUE IOHEXOL 300 MG/ML  SOLN, OMNIPAQUE IOHEXOL 300 MG/ML  SOLN  Comparison: None.  Findings: Bibasilar atelectasis is noted.  There is no pleural or pericardial effusion.  The liver is diffusely low attenuating consistent with fatty infiltration.  No focal liver lesion is identified.  Stones and sludge are seen within the gallbladder but there is no evidence of acute cholecystitis by CT scan.  The liver, adrenal glands, pancreas and kidneys appear normal.  The patient has a very small fat containing umbilical hernia.  The stomach, small and large bowel and appendix all appear normal.  No focal bony abnormality is identified.  IMPRESSION:  1.  No acute finding. 2.  Fatty infiltration of the liver. 3.  Gallstones and gallbladder sludge. 4.  Very small fat containing umbilical hernia.   Original Report Authenticated By: Holley Dexter, M.D.   Ct Coronary Morp W/cta Cor W/score  W/ca W/cm &/or Wo/cm  10/03/2012   OVER-READ INTERPRETATION - CT CHEST  The following report is an over-read performed by radiologist Dr. Florencia Reasons, M.D. of [facility] on 10/03/2012 14:57:34.  This over-read does not include interpretation of cardiac or coronary anatomy or pathology.  The CTA interpretation by the cardiologist is attached.  Comparison:  No priors.  Findings: There are some patchy linear opacities throughout the lungs bilaterally, which may reflect areas of mild subsegmental atelectasis and/or scarring.  Within the visualized thorax there is no acute consolidative airspace disease, no suspicious-appearing pulmonary nodules or masses,  and no pleural effusions.  No pneumothorax.  No mediastinal or hilar lymphadenopathy within the visualized thorax.  Visualized esophagus is appearance.  Visualized portions of the upper abdomen are unremarkable. There are no aggressive appearing lytic or blastic lesions noted in the visualized portions of the skeleton.  IMPRESSION: 1.  No significant incidental noncardiac findings noted.  Cardiac CT:  Indication: Atypical chest pain with abnormal myovue  Protocol: The patient was scanned on a Philips 256 scanner. Gantry rotation speed was 270 msec.  Collimation was .9mm  the patient received 5 mg of iv lopressor and SL nitro Average HR during the scan was 57 bpm.  A 120 kV prospective scan with 5% padding was triggered in the descending thoracic aorta at 111 HU's  A 3D data set at 73,78, and 83 % of the R-R interval were analyzed on a dedicated [employer] work station using MIP, MPR and VRT modes. The patient received 80cc of contrast  Findings:  No significant findings on lung and soft tissue windows  Calcium Score: 65 with multiple foci in the proximal and mid LAD  Coronary Arteries: Left dominant with no anomalies  LM- normal  LAD- Less than 50% soft plaque in the ostial segment. 50% or less mixed plaque in the proximal and mid LAD        D1:  small without significant disease  Circumflex: Large and left dominan no significant disease        OM1: small and normal        OM2: large and normal        PDA: normal        PLA: two large branches and normal  RCA: small non dominant and normal  Impression:     1)    Calcium score 65 all in the LAD 92nd percentile for age and       sex matched controls        2)    Possibly hemodynamically significant soft and mixed plaque in the proximal and mid LAD. Initiation of statins and smoking cessation important Will discuss the possible need for heart cath with Dr Antoine Poche given abnormal myovue study already  Charlton Haws MD [facility]   Original Report Authenticated By: Trudie Reed, M.D.    EKG: NSR no acute ST/ Twave changes   ASSESSMENT AND PLAN:   Chest pain: with abnormal myovue and CT suggesting moderate or more LAD disease. CT interpretation limited by dense calcification.  Cath to assess luminal diameter and r/o hemodynamically significant lesion  Signed: Theron Arista Nishan7/30/2014, 10:34 AM

## 2012-10-09 NOTE — CV Procedure (Signed)
   Cardiac Catheterization Procedure Note  Name: Devin [name]. MRN: [id number] DOB: 12-Jul-1970  Procedure: Left Heart Cath, Selective Coronary Angiography, LV angiography  Indication:  Chest pain abnormal cardiac CT   Procedural Details: The right wrist was prepped, draped, and anesthetized with 1% lidocaine. Using the modified Seldinger technique, a 5 French sheath was introduced into the right radial artery. 3 mg of verapamil was administered through the sheath, weight-based unfractionated heparin was administered intravenously. Standard Judkins catheters were used for selective coronary angiography and left ventriculography. Catheter exchanges were performed over an exchange length guidewire. There were no immediate procedural complications. A TR band was used for radial hemostasis at the completion of the procedure.  The patient was transferred to the post catheterization recovery area for further monitoring.  Procedural Findings: Hemodynamics: AO  128 63 LV  128 10  Coronary angiography: Coronary dominance: right  Left mainstem: normal  Left anterior descending (LAD): Calcified in proximal and mid section.  30% proximal stenosis 40% tubular mid stenosis after first septal  D1: small and diffusely diseased  D2: small and normal  D3: small and normal  Left circumflex (LCx): Large left dominant and normal  OM1:  30% proximal  OM2: normal  PDA/PLA normal  Right coronary artery (RCA):  Small non dominant and diffusely diseased   Left ventriculography: Left ventricular systolic function is normal, LVEF is estimated at 55-65%, there is no significant mitral regurgitation   Final Conclusions:   LAD disease is not flow limiting by angiogram  Recommendations:  CAD  Risk factor modification ASA statin and stop smoking  F/U Dr Antoine Poche in [place] 10/09/2012, 11:53 AM

## 2012-10-30 ENCOUNTER — Ambulatory Visit (INDEPENDENT_AMBULATORY_CARE_PROVIDER_SITE_OTHER): Payer: BC Managed Care – PPO | Admitting: Physician Assistant

## 2012-10-30 ENCOUNTER — Encounter: Payer: Self-pay | Admitting: Physician Assistant

## 2012-10-30 VITALS — BP 131/84 | HR 74 | Ht 78.0 in | Wt 292.0 lb

## 2012-10-30 DIAGNOSIS — R079 Chest pain, unspecified: Secondary | ICD-10-CM

## 2012-10-30 NOTE — Patient Instructions (Signed)
Continue all current medications. Follow up as needed  

## 2012-10-30 NOTE — Progress Notes (Signed)
Primary Cardiologist: Rollene Rotunda, MD   HPI: Scheduled followup, following recent elective cardiac catheterization.   Patient was recently referred for evaluation of atypical CP, and had an intermediate risk Lexiscan Cardiolite on June 13. An echocardiogram indicated preserved LVF (EF 50-55%), but with question of inferobasal/inferoseptal HK. This was followed by an abnormal cardiac CT scan, indicating possibly hemodynamically significant proximal and mid LAD disease.   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 He describes several recent episodes of musculoskeletal CP, which occurred upon standing and after having been supine for an extended period of time.  Allergies  Allergen Reactions  . Crestor [Rosuvastatin] Other (See Comments)    rhabdomyolosis    Current Outpatient Prescriptions  Medication Sig Dispense Refill  . Cholecalciferol (VITAMIN D) 2000 UNITS CAPS Take 1 capsule by mouth every morning.      Marland Kitchen levothyroxine (SYNTHROID, LEVOTHROID) 200 MCG tablet Take 200 mcg by mouth daily before breakfast.      . ondansetron (ZOFRAN) 4 MG tablet Take 4 mg by mouth as needed.      . ranitidine (ZANTAC) 150 MG tablet Take 150-300 mg by mouth daily.      Marland Kitchen aspirin 81 MG tablet Take 81 mg by mouth daily.      . nitroGLYCERIN (NITROSTAT) 0.4 MG SL tablet Place 0.4 mg under the tongue every 5 (five) minutes as needed for chest pain.       No current facility-administered medications for this visit.    Past Medical History  Diagnosis Date  . Hypertension   . Hyperlipidemia   . GERD (gastroesophageal reflux disease)   . Hypothyroidism   . History of rhabdomyolysis   . History of kidney stones   . Vitamin D deficiency   . Chest pain   . Tobacco abuse   . Pericardial effusion     Small, by dobutamine echocardiogram, 04/2006  . Asthma      Past Surgical History  Procedure Laterality Date  . Varicocelectomy    . Percutaneous nephrolithotripsy      History   Social History  . Marital Status: Divorced    Spouse Name: N/A    Number of Children: N/A  . Years of Education: N/A   Occupational History  . 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 Smoking status: Current Every Day Smoker -- 0.50 packs/day for 26 years    Types: Cigarettes    Start date: 03/13/1985  . Smokeless tobacco: Former Neurosurgeon    Types: Snuff, Dorna Bloom    Quit date: 03/14/1987     Comment: started smoking at age 48. chewed and dipped for 2 years  . Alcohol Use: No  . Drug Use: No  . Sexual Activity: Not on file   Other Topics Concern  . Not on file   Social History Narrative   Eats fast food daily   No exercise outside of work   Social History Narrative   Eats fast food daily   No exercise outside of work    Problem Relation Age of Onset  . Coronary artery disease Father   . Coronary artery disease Mother   . Coronary artery disease Maternal Grandfather     both grandfathers and several uncles  . Coronary artery disease    . Hypertension Father   . Hyperlipidemia  Father   . Diabetes Father   . Diabetes Mother   . Diabetes Sister     both sisters  . Hypertension Mother   . Hyperlipidemia Mother     ROS: no nausea, vomiting; no fever, chills; no melena, hematochezia; no claudication  PHYSICAL EXAM: BP 131/84  Pulse 74  Ht 6\' 6"  (1.981 m)  Wt 292 lb (132.45 kg)  BMI 33.75 kg/m2  SpO2 97% GENERAL: 42 year-old male, obese; NAD  HEENT: NCAT, PERRLA, EOMI; sclera clear; no xanthelasma  NECK: palpable bilateral carotid pulses, no bruits; no JVD; no TM  LUNGS: CTA bilaterally  CARDIAC: RRR (S1, S2); no significant murmurs; no rubs or gallops  ABDOMEN: soft, protuberant  EXTREMETIES: R wrist incision site stable, no hematoma; no significant peripheral edema SKIN: warm/dry; no  obvious rash/lesions  MUSCULOSKELETAL: no joint deformity  NEURO: no focal deficit; NL affect    EKG:    ASSESSMENT & PLAN:  Chest pain No further workup indicated. Patient has undergone extensive recent evaluation, starting with an abnormal pharmacologic nuclear imaging study. This was then followed by cardiac CT scan, reviewed by Dr. Eden Emms, which indicated possibly hemodynamically significant proximal and mid LAD disease. Subsequent coronary angiography, however, also performed by Dr. Eden Emms, indicated non-flow-limiting LAD disease with NL LVF. Dr. Eden Emms recommended risk factor modification, ASA, smoking cessation, and a statin. Regarding the latter, however, patient has documented history of rhabdomyolysis resulting from Crestor, on 2 successive occasions, per his report. He had not been previously treated with a statin. Given this, I advised him to never be placed on any type of statin therapy, but rather to try to maintain a low saturated fat diet. He was also strongly advised to stop smoking altogether, and he appeared motivated to do so. We will otherwise plan on having him return to Dr. Antoine Poche on an as-needed basis. Of note, patient is also cleared to proceed with recommended hiatal hernia surgery in the next several weeks, at [facility], with Dr. Franky Macho.    Gene Fiza Nation, PAC

## 2012-10-30 NOTE — Assessment & Plan Note (Addendum)
No further workup indicated. Patient has undergone extensive recent evaluation, starting with an abnormal pharmacologic nuclear imaging study. This was then followed by cardiac CT scan, reviewed by Dr. Eden Emms, which indicated possibly hemodynamically significant proximal and mid LAD disease. Subsequent coronary angiography, however, also performed by Dr. Eden Emms, indicated non-flow-limiting LAD disease with NL LVF. Dr. Eden Emms recommended risk factor modification, ASA, smoking cessation, and a statin. Regarding the latter, however, patient has documented history of rhabdomyolysis resulting from Crestor, on 2 successive occasions per his report. He had not been previously treated with a statin. Given this, I advised him to never be placed on any type of statin therapy, but rather to try to maintain a low saturated fat diet. He was also strongly advised to stop smoking altogether, and he appeared motivated to do so. We will otherwise plan on having him return to Dr. Antoine Poche on an as-needed basis. Of note, patient is also cleared to proceed with recommended hiatal hernia surgery in the next several weeks, at [facility], with Dr. Franky Macho.

## 2012-11-07 ENCOUNTER — Encounter (HOSPITAL_COMMUNITY): Payer: Self-pay | Admitting: Pharmacy Technician

## 2012-11-07 NOTE — H&P (Signed)
  NTS SOAP Note  Vital Signs:  Vitals as of: 11/07/2012: Systolic 159: Diastolic 101: Heart Rate 85: Temp 98.66F: Height 82ft 6in: Weight 295Lbs 0 Ounces: Pain Level 5: BMI 34.09  BMI : 34.09 kg/m2  Subjective: This 42 Years old Male presents for an umbilical hernia.  Is a workman's comp case.  States he started to have swelling recently.  Occ. abdominal discomfort noted.  Made worse with straining.   Review of Symptoms:  Constitutional:  fatigue Head:unremarkable    Eyes:unremarkable   sinus problems     chest pain Respiratory:  dyspnea,cough Gastrointestin    abdominal pain,nausea,heartburn Genitourinary:unremarkable     Musculoskeletal:unremarkable   Skin:unremarkable Hematolgic/Lymphatic:unremarkable       hay fever   Past Medical History:    Reviewed   Past Medical History  Surgical History: kidney stone removal Medical Problems: hypothyroidism, h/o rhabdomyolysis from crestor Allergies: crestor Medications: levothyroixine, ranitidine    Social History:Reviewed  Social History  Preferred Language: English Race:  White Ethnicity: Not Hispanic / Latino Age: 42 Years 7 Months Marital Status:  S Alcohol:  No Recreational drug(s):  No   Smoking Status: Current every day smoker reviewed on 09/24/2012 Started Date: 03/13/1989 Packs per day: 0.50 Functional Status reviewed on mm/dd/yyyy ------------------------------------------------ Bathing: Normal Cooking: Normal Dressing: Normal Driving: Normal Eating: Normal Managing Meds: Normal Oral Care: Normal Shopping: Normal Toileting: Normal Transferring: Normal Walking: Normal Cognitive Status reviewed on mm/dd/yyyy ------------------------------------------------ Attention: Normal Decision Making: Normal Language: Normal Memory: Normal Motor: Normal Perception: Normal Problem Solving: Normal Visual and Spatial: Normal   Family History:  Reviewed  Family  Health History Mother, Deceased; Heart disease;  Father, Deceased; History Unknown    Objective Information: General:  Well appearing, well nourished in no distress. Heart:  RRR, no murmur Lungs:    CTA bilaterally, no wheezes, rhonchi, rales.  Breathing unlabored. Abdomen:Soft, NT/ND, no HSM, no masses.  Reducible umbilical hernia present.  Assessment:Umbilical hernia  Diagnosis &amp; Procedure Smart Code   Plan:Umbilical herniorrhaphy with mesh.  Patient has been cleared by Cardiology for surgery. Patient Education:Alternative treatments to surgery were discussed with patient (and family).  Risks and benefits  of procedure including bleeding, infection, and recurrence of the hernia were fully explained to the patient (and family) who gave informed consent. Patient/family questions were addressed.  Follow-up:Pending Surgery

## 2012-11-08 ENCOUNTER — Encounter (HOSPITAL_COMMUNITY)
Admission: RE | Admit: 2012-11-08 | Discharge: 2012-11-08 | Disposition: A | Discharge status: Home / Self Care | Payer: Worker's Compensation | Source: Ambulatory Visit | Attending: General Surgery | Admitting: General Surgery

## 2012-11-08 ENCOUNTER — Encounter (HOSPITAL_COMMUNITY): Payer: Self-pay

## 2012-11-08 DIAGNOSIS — Z01812 Encounter for preprocedural laboratory examination: Secondary | ICD-10-CM | POA: Insufficient documentation

## 2012-11-08 HISTORY — DX: Calculus of kidney: N20.0

## 2012-11-08 LAB — BASIC METABOLIC PANEL
CO2: 27 mEq/L (ref 19–32)
Chloride: 101 mEq/L (ref 96–112)
Potassium: 4.2 mEq/L (ref 3.5–5.1)
Sodium: 139 mEq/L (ref 135–145)

## 2012-11-08 LAB — CBC WITH DIFFERENTIAL/PLATELET
Eosinophils Relative: 3 % (ref 0–5)
HCT: 45.9 % (ref 39.0–52.0)
Lymphocytes Relative: 39 % (ref 12–46)
Lymphs Abs: 3.4 10*3/uL (ref 0.7–4.0)
MCV: 88.8 fL (ref 78.0–100.0)
Monocytes Absolute: 0.6 10*3/uL (ref 0.1–1.0)
Neutro Abs: 4.4 10*3/uL (ref 1.7–7.7)
RBC: 5.17 MIL/uL (ref 4.22–5.81)
WBC: 8.7 10*3/uL (ref 4.0–10.5)

## 2012-11-08 NOTE — Patient Instructions (Addendum)
Devin [name].  11/08/2012   Your procedure is scheduled on: Wednesday, September 3  Report to [facility] at [place] AM.  Call this number if you have problems the morning of surgery: [phone]   Remember:   Do not eat food or drink liquids after midnight.   Take these medicines the morning of surgery with A SIP OF WATER: Synthroid, Ranitidine, Zofran   Do not wear jewelry, make-up or nail polish.  Do not wear lotions, powders, or perfumes. You may wear deodorant.  Do not shave 48 hours prior to surgery. Men may shave face and neck.  Do not bring valuables to the hospital.  [facility] is not responsible                   for any belongings or valuables.  Contacts, dentures or bridgework may not be worn into surgery.  Leave suitcase in the car. After surgery it may be brought to your room.  For patients admitted to the hospital, checkout time is 11:00 AM the day of  discharge.   Patients discharged the day of surgery will not be allowed to drive  home.  Name and phone number of your driver: Family or friends  Special Instructions: Shower using CHG 2 nights before surgery and the night before surgery.  If you shower the day of surgery use CHG.  Use special wash - you have one bottle of CHG for all showers.  You should use approximately 1/3 of the bottle for each shower.   Please read over the following fact sheets that you were given: Pain Booklet, Coughing and Deep Breathing, MRSA Information, Surgical Site Infection Prevention, Anesthesia Post-op Instructions and Care and Recovery After Surgery  PATIENT INSTRUCTIONS POST-ANESTHESIA  IMMEDIATELY FOLLOWING SURGERY:  Do not drive or operate machinery for the first twenty four hours after surgery.  Do not make any important decisions for twenty four hours after surgery or while taking narcotic pain medications or sedatives.  If you develop intractable nausea and vomiting or a severe headache please notify your doctor  immediately.  FOLLOW-UP:  Please make an appointment with your surgeon as instructed. You do not need to follow up with anesthesia unless specifically instructed to do so.  WOUND CARE INSTRUCTIONS (if applicable):  Keep a dry clean dressing on the anesthesia/puncture wound site if there is drainage.  Once the wound has quit draining you may leave it open to air.  Generally you should leave the bandage intact for twenty four hours unless there is drainage.  If the epidural site drains for more than 36-48 hours please call the anesthesia department.  QUESTIONS?:  Please feel free to call your physician or the hospital operator if you have any questions, and they will be happy to assist you.     Hernia A hernia occurs when an internal organ pushes out through a weak spot in the abdominal wall. Hernias most commonly occur in the groin and around the navel. Hernias often can be pushed back into place (reduced). Most hernias tend to get worse over time. Some abdominal hernias can get stuck in the opening (irreducible or incarcerated hernia) and cannot be reduced. An irreducible abdominal hernia which is tightly squeezed into the opening is at risk for impaired blood supply (strangulated hernia). A strangulated hernia is a medical emergency. Because of the risk for an irreducible or strangulated hernia, surgery may be recommended to repair a hernia. CAUSES   Heavy lifting.  Prolonged coughing.  Straining to have a bowel movement.  A cut (incision) made during an abdominal surgery. HOME CARE INSTRUCTIONS   Bed rest is not required. You may continue your normal activities.  Avoid lifting more than 10 pounds (4.5 kg) or straining.  Cough gently. If you are a smoker it is best to stop. Even the best hernia repair can break down with the continual strain of coughing. Even if you do not have your hernia repaired, a cough will continue to aggravate the problem.  Do not wear anything tight over your  hernia. Do not try to keep it in with an outside bandage or truss. These can damage abdominal contents if they are trapped within the hernia sac.  Eat a normal diet.  Avoid constipation. Straining over long periods of time will increase hernia size and encourage breakdown of repairs. If you cannot do this with diet alone, stool softeners may be used. SEEK IMMEDIATE MEDICAL CARE IF:   You have a fever.  You develop increasing abdominal pain.  You feel nauseous or vomit.  Your hernia is stuck outside the abdomen, looks discolored, feels hard, or is tender.  You have any changes in your bowel habits or in the hernia that are unusual for you.  You have increased pain or swelling around the hernia.  You cannot push the hernia back in place by applying gentle pressure while lying down. MAKE SURE YOU:   Understand these instructions.  Will watch your condition.  Will get help right away if you are not doing well or get worse. Document Released: 02/27/2005 Document Revised: 05/22/2011 Document Reviewed: 10/17/2007 [facility] Patient Information 2014 [place], [place].

## 2012-11-13 ENCOUNTER — Encounter (HOSPITAL_COMMUNITY)
Admission: RE | Disposition: A | Discharge status: Home / Self Care | Payer: Self-pay | Source: Ambulatory Visit | Attending: General Surgery

## 2012-11-13 ENCOUNTER — Encounter (HOSPITAL_COMMUNITY): Payer: Self-pay | Admitting: Anesthesiology

## 2012-11-13 ENCOUNTER — Encounter (HOSPITAL_COMMUNITY): Payer: Self-pay

## 2012-11-13 ENCOUNTER — Ambulatory Visit (HOSPITAL_COMMUNITY)
Admission: RE | Admit: 2012-11-13 | Discharge: 2012-11-13 | Disposition: A | Discharge status: Home / Self Care | Payer: Worker's Compensation | Source: Ambulatory Visit | Attending: General Surgery | Admitting: General Surgery

## 2012-11-13 ENCOUNTER — Ambulatory Visit (HOSPITAL_COMMUNITY): Payer: Worker's Compensation | Admitting: Anesthesiology

## 2012-11-13 DIAGNOSIS — K429 Umbilical hernia without obstruction or gangrene: Secondary | ICD-10-CM | POA: Insufficient documentation

## 2012-11-13 HISTORY — PX: UMBILICAL HERNIA REPAIR: SHX196

## 2012-11-13 HISTORY — PX: INSERTION OF MESH: SHX5868

## 2012-11-13 SURGERY — REPAIR, HERNIA, UMBILICAL, ADULT
Anesthesia: General | Site: Abdomen | Wound class: Clean

## 2012-11-13 MED ORDER — NEOSTIGMINE METHYLSULFATE 1 MG/ML IJ SOLN
INTRAMUSCULAR | Status: AC
  Filled 2012-11-13: qty 1

## 2012-11-13 MED ORDER — CEFAZOLIN SODIUM 1-5 GM-% IV SOLN: 1.0000 g | Freq: Once | INTRAVENOUS | Status: DC

## 2012-11-13 MED ORDER — POVIDONE-IODINE 10 % EX OINT
TOPICAL_OINTMENT | CUTANEOUS | Status: AC
  Filled 2012-11-13: qty 1

## 2012-11-13 MED ORDER — PROPOFOL 10 MG/ML IV BOLUS
INTRAVENOUS | Status: DC | PRN
  Administered 2012-11-13: 50 mg via INTRAVENOUS
  Administered 2012-11-13: 150 mg via INTRAVENOUS

## 2012-11-13 MED ORDER — BUPIVACAINE HCL (PF) 0.5 % IJ SOLN
INTRAMUSCULAR | Status: AC
  Filled 2012-11-13: qty 30

## 2012-11-13 MED ORDER — ROCURONIUM BROMIDE 50 MG/5ML IV SOLN
INTRAVENOUS | Status: AC
  Filled 2012-11-13: qty 1

## 2012-11-13 MED ORDER — FENTANYL CITRATE 0.05 MG/ML IJ SOLN
INTRAMUSCULAR | Status: AC
  Filled 2012-11-13: qty 2

## 2012-11-13 MED ORDER — LIDOCAINE HCL (PF) 1 % IJ SOLN
INTRAMUSCULAR | Status: AC
  Filled 2012-11-13: qty 5

## 2012-11-13 MED ORDER — SUCCINYLCHOLINE CHLORIDE 20 MG/ML IJ SOLN
INTRAMUSCULAR | Status: DC | PRN
  Administered 2012-11-13: 140 mg via INTRAVENOUS

## 2012-11-13 MED ORDER — CEFAZOLIN SODIUM-DEXTROSE 2-3 GM-% IV SOLR
2.0000 g | INTRAVENOUS | Status: DC
  Administered 2012-11-13: 2 g via INTRAVENOUS

## 2012-11-13 MED ORDER — BUPIVACAINE HCL (PF) 0.5 % IJ SOLN
INTRAMUSCULAR | Status: DC | PRN
  Administered 2012-11-13: 10 mL

## 2012-11-13 MED ORDER — LACTATED RINGERS IV SOLN
INTRAVENOUS | Status: DC
  Administered 2012-11-13: 07:00:00 via INTRAVENOUS

## 2012-11-13 MED ORDER — ROCURONIUM BROMIDE 100 MG/10ML IV SOLN
INTRAVENOUS | Status: DC | PRN
  Administered 2012-11-13: 5 mg via INTRAVENOUS
  Administered 2012-11-13: 25 mg via INTRAVENOUS

## 2012-11-13 MED ORDER — PROPOFOL 10 MG/ML IV EMUL
INTRAVENOUS | Status: AC
  Filled 2012-11-13: qty 20

## 2012-11-13 MED ORDER — KETOROLAC TROMETHAMINE 30 MG/ML IJ SOLN
INTRAMUSCULAR | Status: AC
  Filled 2012-11-13: qty 1

## 2012-11-13 MED ORDER — MIDAZOLAM HCL 2 MG/2ML IJ SOLN
INTRAMUSCULAR | Status: AC
  Filled 2012-11-13: qty 2

## 2012-11-13 MED ORDER — ONDANSETRON HCL 4 MG/2ML IJ SOLN
4.0000 mg | Freq: Once | INTRAMUSCULAR | Status: AC
  Administered 2012-11-13: 4 mg via INTRAVENOUS

## 2012-11-13 MED ORDER — SUCCINYLCHOLINE CHLORIDE 20 MG/ML IJ SOLN
INTRAMUSCULAR | Status: AC
  Filled 2012-11-13: qty 1

## 2012-11-13 MED ORDER — FENTANYL CITRATE 0.05 MG/ML IJ SOLN
25.0000 ug | INTRAMUSCULAR | Status: AC
  Administered 2012-11-13: 25 ug via INTRAVENOUS

## 2012-11-13 MED ORDER — FENTANYL CITRATE 0.05 MG/ML IJ SOLN
25.0000 ug | INTRAMUSCULAR | Status: DC | PRN
  Administered 2012-11-13: 25 ug via INTRAVENOUS

## 2012-11-13 MED ORDER — FENTANYL CITRATE 0.05 MG/ML IJ SOLN
INTRAMUSCULAR | Status: DC | PRN
  Administered 2012-11-13: 50 ug via INTRAVENOUS
  Administered 2012-11-13: 25 ug via INTRAVENOUS

## 2012-11-13 MED ORDER — CEFAZOLIN SODIUM-DEXTROSE 2-3 GM-% IV SOLR
INTRAVENOUS | Status: AC
  Filled 2012-11-13: qty 50

## 2012-11-13 MED ORDER — GLYCOPYRROLATE 0.2 MG/ML IJ SOLN
INTRAMUSCULAR | Status: DC | PRN
  Administered 2012-11-13: 0.4 mg via INTRAVENOUS
  Administered 2012-11-13: 0.2 mg via INTRAVENOUS

## 2012-11-13 MED ORDER — POVIDONE-IODINE 10 % OINT PACKET
TOPICAL_OINTMENT | CUTANEOUS | Status: DC | PRN
  Administered 2012-11-13: 1 via TOPICAL

## 2012-11-13 MED ORDER — ENOXAPARIN SODIUM 40 MG/0.4ML ~~LOC~~ SOLN
40.0000 mg | Freq: Once | SUBCUTANEOUS | Status: AC
  Administered 2012-11-13: 40 mg via SUBCUTANEOUS

## 2012-11-13 MED ORDER — NEOSTIGMINE METHYLSULFATE 1 MG/ML IJ SOLN
INTRAMUSCULAR | Status: DC | PRN
  Administered 2012-11-13: 2 mg via INTRAVENOUS

## 2012-11-13 MED ORDER — ENOXAPARIN SODIUM 40 MG/0.4ML ~~LOC~~ SOLN
SUBCUTANEOUS | Status: AC
Start: 2012-11-13 — End: 2012-11-13
  Filled 2012-11-13: qty 0.4

## 2012-11-13 MED ORDER — CHLORHEXIDINE GLUCONATE 4 % EX LIQD: 1.0000 "application " | Freq: Once | CUTANEOUS | Status: DC

## 2012-11-13 MED ORDER — MIDAZOLAM HCL 2 MG/2ML IJ SOLN
1.0000 mg | INTRAMUSCULAR | Status: DC | PRN
  Administered 2012-11-13 (×2): 2 mg via INTRAVENOUS

## 2012-11-13 MED ORDER — ONDANSETRON HCL 4 MG/2ML IJ SOLN
INTRAMUSCULAR | Status: AC
  Filled 2012-11-13: qty 2

## 2012-11-13 MED ORDER — ONDANSETRON HCL 4 MG/2ML IJ SOLN: 4.0000 mg | Freq: Once | INTRAMUSCULAR | Status: DC | PRN

## 2012-11-13 MED ORDER — KETOROLAC TROMETHAMINE 30 MG/ML IJ SOLN
30.0000 mg | Freq: Once | INTRAMUSCULAR | Status: AC
  Administered 2012-11-13: 30 mg via INTRAVENOUS

## 2012-11-13 MED ORDER — OXYCODONE-ACETAMINOPHEN 7.5-325 MG PO TABS: 1.0000 | ORAL_TABLET | ORAL | Status: DC | PRN

## 2012-11-13 MED ORDER — LIDOCAINE HCL (CARDIAC) 10 MG/ML IV SOLN
INTRAVENOUS | Status: DC | PRN
  Administered 2012-11-13: 20 mg via INTRAVENOUS

## 2012-11-13 MED ORDER — CEFAZOLIN SODIUM-DEXTROSE 2-3 GM-% IV SOLR: 2.0000 g | Freq: Once | INTRAVENOUS | Status: DC

## 2012-11-13 MED ORDER — GLYCOPYRROLATE 0.2 MG/ML IJ SOLN
INTRAMUSCULAR | Status: AC
  Filled 2012-11-13: qty 2

## 2012-11-13 MED ORDER — FENTANYL CITRATE 0.05 MG/ML IJ SOLN
25.0000 ug | INTRAMUSCULAR | Status: DC | PRN
  Administered 2012-11-13 (×4): 50 ug via INTRAVENOUS

## 2012-11-13 MED ORDER — 0.9 % SODIUM CHLORIDE (POUR BTL) OPTIME
TOPICAL | Status: DC | PRN
  Administered 2012-11-13: 1000 mL

## 2012-11-13 SURGICAL SUPPLY — 34 items
BAG HAMPER (MISCELLANEOUS) ×2 IMPLANT
BLADE SURG SZ11 CARB STEEL (BLADE) ×2 IMPLANT
CLOTH BEACON ORANGE TIMEOUT ST (SAFETY) ×2 IMPLANT
COVER LIGHT HANDLE STERIS (MISCELLANEOUS) ×4 IMPLANT
DECANTER SPIKE VIAL GLASS SM (MISCELLANEOUS) ×2 IMPLANT
DURAPREP 26ML APPLICATOR (WOUND CARE) ×2 IMPLANT
ELECT REM PT RETURN 9FT ADLT (ELECTROSURGICAL) ×2
ELECTRODE REM PT RTRN 9FT ADLT (ELECTROSURGICAL) ×1 IMPLANT
GLOVE BIO SURGEON STRL SZ7.5 (GLOVE) ×2 IMPLANT
GLOVE BIOGEL PI IND STRL 7.0 (GLOVE) ×2 IMPLANT
GLOVE BIOGEL PI INDICATOR 7.0 (GLOVE) ×2
GLOVE ECLIPSE 6.5 STRL STRAW (GLOVE) ×2 IMPLANT
GLOVE EXAM NITRILE MD LF STRL (GLOVE) ×2 IMPLANT
GLOVE SS BIOGEL STRL SZ 6.5 (GLOVE) ×1 IMPLANT
GLOVE SUPERSENSE BIOGEL SZ 6.5 (GLOVE) ×1
GOWN STRL REIN XL XLG (GOWN DISPOSABLE) ×6 IMPLANT
INST SET MINOR GENERAL (KITS) ×2 IMPLANT
KIT ROOM TURNOVER APOR (KITS) ×2 IMPLANT
MANIFOLD NEPTUNE II (INSTRUMENTS) ×2 IMPLANT
NEEDLE HYPO 25X1 1.5 SAFETY (NEEDLE) ×2 IMPLANT
NS IRRIG 1000ML POUR BTL (IV SOLUTION) ×2 IMPLANT
PACK MINOR (CUSTOM PROCEDURE TRAY) ×2 IMPLANT
PAD ARMBOARD 7.5X6 YLW CONV (MISCELLANEOUS) ×2 IMPLANT
PATCH VENTRAL SMALL 4.3 (Mesh Specialty) ×2 IMPLANT
SET BASIN LINEN APH (SET/KITS/TRAYS/PACK) ×2 IMPLANT
SPONGE GAUZE 2X2 8PLY STRL LF (GAUZE/BANDAGES/DRESSINGS) ×2 IMPLANT
STAPLER VISISTAT (STAPLE) ×2 IMPLANT
SUT ETHIBOND NAB MO 7 #0 18IN (SUTURE) ×2 IMPLANT
SUT VIC AB 2-0 CT2 27 (SUTURE) ×2 IMPLANT
SUT VIC AB 3-0 SH 27 (SUTURE) ×2
SUT VIC AB 3-0 SH 27X BRD (SUTURE) ×1 IMPLANT
SUT VICRYL AB 3 0 TIES (SUTURE) IMPLANT
SYR CONTROL 10ML LL (SYRINGE) ×2 IMPLANT
TAPE CLOTH SURG 4X10 WHT LF (GAUZE/BANDAGES/DRESSINGS) ×2 IMPLANT

## 2012-11-13 NOTE — Anesthesia Postprocedure Evaluation (Signed)
Anesthesia Post Note  Patient: Devin [name].  Procedure(s) Performed: Procedure(s) (LRB): UMBILICAL HERNIORRHAPHY (N/A) INSERTION OF MESH (N/A)  Anesthesia type: General  Patient location: PACU  Post pain: Pain level controlled  Post assessment: Post-op Vital signs reviewed, Patient's Cardiovascular Status Stable, Respiratory Function Stable, Patent Airway, No signs of Nausea or vomiting and Pain level controlled  Last Vitals:  Filed Vitals:   11/13/12 0832  BP: 160/96  Pulse: 84  Temp: 36.5 C  Resp: 24    Post vital signs: Reviewed and stable  Level of consciousness: awake and alert   Complications: No apparent anesthesia complications

## 2012-11-13 NOTE — Anesthesia Procedure Notes (Signed)
Procedure Name: Intubation Date/Time: 11/13/2012 7:45 AM Performed by: Franco Nones Pre-anesthesia Checklist: Patient identified, Patient being monitored, Timeout performed, Emergency Drugs available and Suction available Patient Re-evaluated:Patient Re-evaluated prior to inductionOxygen Delivery Method: Circle System Utilized Preoxygenation: Pre-oxygenation with 100% oxygen Intubation Type: IV induction, Rapid sequence and Cricoid Pressure applied Ventilation: Mask ventilation without difficulty Laryngoscope Size: Miller and 3 Grade View: Grade I Tube type: Oral Tube size: 8.0 mm Number of attempts: 1 Airway Equipment and Method: stylet Placement Confirmation: ETT inserted through vocal cords under direct vision,  positive ETCO2 and breath sounds checked- equal and bilateral Secured at: 22 cm Tube secured with: Tape Dental Injury: Teeth and Oropharynx as per pre-operative assessment

## 2012-11-13 NOTE — Op Note (Signed)
Patient:  Devin [name].  DOB:  May 11, 1970  MRN:  [id number]   Preop Diagnosis:  Umbilical hernia  Postop Diagnosis:  Same  Procedure:  Umbilical herniorrhaphy with mesh  Surgeon:  Franky Macho, M.D.  Anes:  General endotracheal  Indications:  Patient is a 42 year old white male who presents with a symptomatic umbilical hernia. The risks and benefits of the procedure including bleeding, infection, and recurrence of the hernia were fully explained to the patient, who gave informed consent.  Procedure note:  The patient is placed the supine position. After induction of general endotracheal anesthesia, the abdomen was prepped and draped using usual sterile technique with DuraPrep. Surgical site confirmation was performed.  An infraumbilical incision was made down to the fascia. The umbilicus was freed away from the underlying hernia sac. The hernia sac was excised. A small amount of omentum was present and was reduced into the abdominal cavity. The abdominal wall was then expected with a finger and no other adhesions were noted. The defect measured approximately 1.5 cm in its greatest diameter. A proceed ventral patch, 4.2 cm, was then inserted and secured to the fascia with the tabs using an 0 Ethibond interrupted suture. The fascia overlying the patch was then reapproximated transversely using 0 Ethibond interrupted sutures. The umbilicus was secured back to the fascia using a 2-0 Vicryl interrupted suture. The subcutaneous layer was reapproximated using 3-0 Vicryl interrupted suture. The skin was closed using staples. 0.5% Sensorcaine was instilled the surrounding wound. Betadine ointment and dressed a dressings were applied.  All tape and needle counts were correct the end of the procedure. Patient was extubated in the operating room and transferred to PACU in stable condition.  Complications:  None  EBL:  Minimal  Specimen:  None

## 2012-11-13 NOTE — Transfer of Care (Signed)
Immediate Anesthesia Transfer of Care Note  Patient: Devin [name].  Procedure(s) Performed: Procedure(s) (LRB): UMBILICAL HERNIORRHAPHY (N/A) INSERTION OF MESH (N/A)  Patient Location: PACU  Anesthesia Type: General  Level of Consciousness: awake  Airway & Oxygen Therapy: Patient Spontanous Breathing and non-rebreather face mask  Post-op Assessment: Report given to PACU RN, Post -op Vital signs reviewed and stable and Patient moving all extremities  Post vital signs: Reviewed and stable  Complications: No apparent anesthesia complications

## 2012-11-13 NOTE — Anesthesia Preprocedure Evaluation (Signed)
Anesthesia Evaluation  Patient identified by MRN, date of birth, ID band Patient awake    Reviewed: Allergy & Precautions, H&P , NPO status , Patient's Chart, lab work & pertinent test results  Airway Mallampati: III TM Distance: >3 FB Neck ROM: Full    Dental  (+) Partial Upper   Pulmonary asthma , Current Smoker,  breath sounds clear to auscultation        Cardiovascular hypertension, Pt. on medications Rhythm:Regular Rate:Normal     Neuro/Psych    GI/Hepatic GERD-  Medicated,  Endo/Other  Hypothyroidism   Renal/GU Renal disease     Musculoskeletal   Abdominal   Peds  Hematology   Anesthesia Other Findings   Reproductive/Obstetrics                           Anesthesia Physical Anesthesia Plan  ASA: III  Anesthesia Plan: General   Post-op Pain Management:    Induction: Intravenous, Rapid sequence and Cricoid pressure planned  Airway Management Planned: Oral ETT  Additional Equipment:   Intra-op Plan:   Post-operative Plan: Extubation in OR  Informed Consent: I have reviewed the patients History and Physical, chart, labs and discussed the procedure including the risks, benefits and alternatives for the proposed anesthesia with the patient or authorized representative who has indicated his/her understanding and acceptance.     Plan Discussed with:   Anesthesia Plan Comments:         Anesthesia Quick Evaluation

## 2012-11-13 NOTE — Interval H&P Note (Signed)
History and Physical Interval Note:  11/13/2012 7:31 AM  Devin [name].  has presented today for surgery, with the diagnosis of umbilical hernia   The various methods of treatment have been discussed with the patient and family. After consideration of risks, benefits and other options for treatment, the patient has consented to  Procedure(s): HERNIA REPAIR UMBILICAL ADULT WITH MESH (N/A) as a surgical intervention .  The patient's history has been reviewed, patient examined, no change in status, stable for surgery.  I have reviewed the patient's chart and labs.  Questions were answered to the patient's satisfaction.     Franky Macho A

## 2012-11-14 ENCOUNTER — Encounter (HOSPITAL_COMMUNITY): Payer: Self-pay | Admitting: General Surgery

## 2013-01-25 ENCOUNTER — Emergency Department (HOSPITAL_COMMUNITY): Payer: BC Managed Care – PPO

## 2013-01-25 ENCOUNTER — Observation Stay (HOSPITAL_COMMUNITY)
Admission: EM | Admit: 2013-01-25 | Discharge: 2013-01-27 | Disposition: A | Discharge status: Home / Self Care | Payer: BC Managed Care – PPO | Attending: General Surgery | Admitting: General Surgery

## 2013-01-25 ENCOUNTER — Encounter (HOSPITAL_COMMUNITY): Payer: Self-pay | Admitting: Emergency Medicine

## 2013-01-25 DIAGNOSIS — Z01812 Encounter for preprocedural laboratory examination: Secondary | ICD-10-CM | POA: Insufficient documentation

## 2013-01-25 DIAGNOSIS — K8000 Calculus of gallbladder with acute cholecystitis without obstruction: Principal | ICD-10-CM | POA: Insufficient documentation

## 2013-01-25 DIAGNOSIS — K8020 Calculus of gallbladder without cholecystitis without obstruction: Secondary | ICD-10-CM

## 2013-01-25 LAB — CBC WITH DIFFERENTIAL/PLATELET
Eosinophils Absolute: 0.1 10*3/uL (ref 0.0–0.7)
Eosinophils Relative: 1 % (ref 0–5)
HCT: 44.9 % (ref 39.0–52.0)
Hemoglobin: 15.1 g/dL (ref 13.0–17.0)
Lymphs Abs: 2.5 10*3/uL (ref 0.7–4.0)
MCH: 30 pg (ref 26.0–34.0)
MCV: 89.3 fL (ref 78.0–100.0)
Monocytes Relative: 6 % (ref 3–12)
Neutrophils Relative %: 71 % (ref 43–77)
RBC: 5.03 MIL/uL (ref 4.22–5.81)

## 2013-01-25 LAB — COMPREHENSIVE METABOLIC PANEL
Alkaline Phosphatase: 107 U/L (ref 39–117)
BUN: 18 mg/dL (ref 6–23)
GFR calc Af Amer: >90 mL/min (ref >90)
Glucose, Bld: 155 mg/dL — ABNORMAL HIGH (ref 70–99)
Potassium: 3.9 mEq/L (ref 3.5–5.1)
Total Protein: 7.9 g/dL (ref 6.0–8.3)

## 2013-01-25 LAB — LIPASE, BLOOD: Lipase: 34 U/L (ref 11–59)

## 2013-01-25 MED ORDER — IOHEXOL 300 MG/ML  SOLN
100.0000 mL | Freq: Once | INTRAMUSCULAR | Status: AC | PRN
  Administered 2013-01-25: 100 mL via INTRAVENOUS

## 2013-01-25 MED ORDER — SODIUM CHLORIDE 0.9 % IV SOLN
INTRAVENOUS | Status: DC
  Administered 2013-01-25 (×2): via INTRAVENOUS

## 2013-01-25 MED ORDER — MORPHINE SULFATE 4 MG/ML IJ SOLN
4.0000 mg | INTRAMUSCULAR | Status: AC | PRN
  Administered 2013-01-25 – 2013-01-26 (×2): 4 mg via INTRAVENOUS
  Filled 2013-01-25 (×2): qty 1

## 2013-01-25 MED ORDER — IOHEXOL 300 MG/ML  SOLN
50.0000 mL | Freq: Once | INTRAMUSCULAR | Status: AC | PRN
  Administered 2013-01-25: 50 mL via ORAL

## 2013-01-25 MED ORDER — ONDANSETRON HCL 4 MG/2ML IJ SOLN
4.0000 mg | INTRAMUSCULAR | Status: AC | PRN
  Administered 2013-01-25 – 2013-01-26 (×2): 4 mg via INTRAVENOUS
  Filled 2013-01-25 (×2): qty 2

## 2013-01-25 NOTE — ED Notes (Addendum)
Pt states he had a hernia operation by Dr.Jenkins about a 2.5 months ago and the last 3-4 days pt states for a couple of days, pt says there was like a tennis ball size knot near his belly button. He complains of that area hurting after he eats and feels nauseated.

## 2013-01-25 NOTE — ED Provider Notes (Signed)
CSN: [id number]     Arrival date & time 01/25/13  1948 History   First MD Initiated Contact with Patient 01/25/13 2058     Chief Complaint  Patient presents with  . Abdominal Pain    HPI Pt was seen at 2120.  Per pt, c/o gradual onset and persistence of constant periumbilical abd "pain" for the past 3 to 4 days.  Has been associated with nausea.  Describes the abd pain as "it feels like someone kicked me in the gut." Symptoms worsen after he eats.  Denies vomiting/diarrhea, no fevers, no back pain, no rash, no CP/SOB, no black or blood in stools, no injury.      Past Medical History  Diagnosis Date  . Hypertension   . Hyperlipidemia   . GERD (gastroesophageal reflux disease)   . Hypothyroidism   . History of rhabdomyolysis   . History of kidney stones   . Vitamin D deficiency   . Chest pain   . Tobacco abuse   . Pericardial effusion     Small, by dobutamine echocardiogram, 04/2006  . Asthma   . Kidney calculus 2014   Past Surgical History  Procedure Laterality Date  . Varicocelectomy    . Percutaneous nephrolithotripsy    . Hernia repair      As a child  . Kidney stone surgery    . Umbilical hernia repair N/A 11/13/2012    Procedure: UMBILICAL HERNIORRHAPHY;  Surgeon: Dalia Heading, MD;  Location: [facility];  Service: General;  Laterality: N/A;  . Insertion of mesh N/A 11/13/2012    Procedure: INSERTION OF MESH;  Surgeon: Dalia Heading, MD;  Location: [facility];  Service: General;  Laterality: N/A;   Family History  Problem Relation Age of Onset  . Coronary artery disease Father   . Coronary artery disease Mother   . Coronary artery disease Maternal Grandfather     both grandfathers and several uncles  . Coronary artery disease    . Hypertension Father   . Hyperlipidemia Father   . Diabetes Father   . Diabetes Mother   . Diabetes Sister     both sisters  . Hypertension Mother   . Hyperlipidemia Mother    History  Substance Use Topics  . Smoking status: Current Every  Day Smoker -- 0.50 packs/day for 26 years    Types: Cigarettes    Start date: 03/13/1985  . Smokeless tobacco: Former Neurosurgeon    Types: Snuff, Dorna Bloom    Quit date: 03/14/1987     Comment: started smoking at age 48. chewed and dipped for 2 years  . Alcohol Use: No    Review of Systems ROS: Statement: All systems negative except as marked or noted in the HPI; Constitutional: Negative for fever and chills. ; ; Eyes: Negative for eye pain, redness and discharge. ; ; ENMT: Negative for ear pain, hoarseness, nasal congestion, sinus pressure and sore throat. ; ; Cardiovascular: Negative for chest pain, palpitations, diaphoresis, dyspnea and peripheral edema. ; ; Respiratory: Negative for cough, wheezing and stridor. ; ; Gastrointestinal: +abd pain, nausea. Negative for vomiting, diarrhea, blood in stool, hematemesis, jaundice and rectal bleeding. . ; ; Genitourinary: Negative for dysuria, flank pain and hematuria. ; ; Musculoskeletal: Negative for back pain and neck pain. Negative for swelling and trauma.; ; Skin: Negative for pruritus, rash, abrasions, blisters, bruising and skin lesion.; ; Neuro: Negative for headache, lightheadedness and neck stiffness. Negative for weakness, altered level of consciousness , altered mental status, extremity  weakness, paresthesias, involuntary movement, seizure and syncope.       Allergies  Bee venom and Crestor  Home Medications   Current Outpatient Rx  Name  Route  Sig  Dispense  Refill  . levothyroxine (SYNTHROID, LEVOTHROID) 200 MCG tablet   Oral   Take 200 mcg by mouth daily before breakfast.         . ranitidine (ZANTAC) 150 MG tablet   Oral   Take 150-300 mg by mouth daily.         . nitroGLYCERIN (NITROSTAT) 0.4 MG SL tablet   Sublingual   Place 0.4 mg under the tongue every 5 (five) minutes as needed for chest pain.          BP 169/103  Pulse 90  Temp(Src) 97.5 F (36.4 C)  Resp 20  Ht 6\' 6"  (1.981 m)  Wt 300 lb (136.079 kg)  BMI 34.68  kg/m2  SpO2 97% Physical Exam 2025: Physical examination:  Nursing notes reviewed; Vital signs and O2 SAT reviewed;  Constitutional: Well developed, Well nourished, Well hydrated, Uncomfortable appearing.; Head:  Normocephalic, atraumatic; Eyes: EOMI, PERRL, No scleral icterus; ENMT: Mouth and pharynx normal, Mucous membranes moist; Neck: Supple, Full range of motion, No lymphadenopathy; Cardiovascular: Regular rate and rhythm, No gallop; Respiratory: Breath sounds clear & equal bilaterally, No wheezes.  Speaking full sentences with ease, Normal respiratory effort/excursion; Chest: Nontender, Movement normal; Abdomen: Soft, +periumbilical tenderness to palp. No rebound or guarding. No palp hernia. Well healed surgical scar distal to umbilicus. Nondistended, Normal bowel sounds; Genitourinary: No CVA tenderness; Extremities: Pulses normal, No tenderness, No edema, No calf edema or asymmetry.; Neuro: AA&Ox3, Major CN grossly intact.  Speech clear. No gross focal motor or sensory deficits in extremities.; Skin: Color normal, Warm, Dry.   ED Course  Procedures    EKG Interpretation   None       MDM  MDM Reviewed: previous chart, nursing note and vitals Reviewed previous: labs and CT scan Interpretation: CT scan, x-ray and labs   Results for orders placed during the hospital encounter of 01/25/13  CBC WITH DIFFERENTIAL      Result Value Range   WBC 11.2 (*) 4.0 - 10.5 K/uL   RBC 5.03  4.22 - 5.81 MIL/uL   Hemoglobin 15.1  13.0 - 17.0 g/dL   HCT 16.1  09.6 - 04.5 %   MCV 89.3  78.0 - 100.0 fL   MCH 30.0  26.0 - 34.0 pg   MCHC 33.6  30.0 - 36.0 g/dL   RDW 40.9  81.1 - 91.4 %   Platelets 245  150 - 400 K/uL   Neutrophils Relative % 71  43 - 77 %   Neutro Abs 7.9 (*) 1.7 - 7.7 K/uL   Lymphocytes Relative 22  12 - 46 %   Lymphs Abs 2.5  0.7 - 4.0 K/uL   Monocytes Relative 6  3 - 12 %   Monocytes Absolute 0.7  0.1 - 1.0 K/uL   Eosinophils Relative 1  0 - 5 %   Eosinophils Absolute 0.1   0.0 - 0.7 K/uL   Basophils Relative 0  0 - 1 %   Basophils Absolute 0.0  0.0 - 0.1 K/uL  COMPREHENSIVE METABOLIC PANEL      Result Value Range   Sodium 137  135 - 145 mEq/L   Potassium 3.9  3.5 - 5.1 mEq/L   Chloride 100  96 - 112 mEq/L   CO2 28  19 - 32  mEq/L   Glucose, Bld 155 (*) 70 - 99 mg/dL   BUN 18  6 - 23 mg/dL   Creatinine, Ser 7.82  0.50 - 1.35 mg/dL   Calcium 95.6  8.4 - 21.3 mg/dL   Total Protein 7.9  6.0 - 8.3 g/dL   Albumin 4.1  3.5 - 5.2 g/dL   AST 31  0 - 37 U/L   ALT 69 (*) 0 - 53 U/L   Alkaline Phosphatase 107  39 - 117 U/L   Total Bilirubin 0.3  0.3 - 1.2 mg/dL   GFR calc non Af Amer >90  >90 mL/min   GFR calc Af Amer >90  >90 mL/min  LIPASE, BLOOD      Result Value Range   Lipase 34  11 - 59 U/L   Dg Chest 2 View 01/25/2013   CLINICAL DATA:  Abdominal pain; status post hernia repair.  EXAM: CHEST  2 VIEW  COMPARISON:  CTA of the chest performed 10/03/2012  FINDINGS: The lungs are well-aerated. Pulmonary vascularity is at the upper limits of normal. Mild bibasilar opacities likely reflect atelectasis. No pleural effusion or pneumothorax is seen.  The heart is normal in size; the mediastinal contour is within normal limits. No acute osseous abnormalities are seen.  IMPRESSION: Mild bibasilar airspace opacities likely reflect atelectasis; lungs otherwise clear.   Electronically Signed   By: Roanna Raider M.D.   On: 01/25/2013 23:32   Ct Abdomen Pelvis W Contrast 01/25/2013   CLINICAL DATA:  Abdominal pain.  EXAM: CT ABDOMEN AND PELVIS WITH CONTRAST  TECHNIQUE: Multidetector CT imaging of the abdomen and pelvis was performed using the standard protocol following bolus administration of intravenous contrast.  CONTRAST:  50mL OMNIPAQUE IOHEXOL 300 MG/ML SOLN, OMNIPAQUE IOHEXOL 300 MG/ML SOLN  COMPARISON:  CT of the abdomen and pelvis performed 09/10/2012  FINDINGS: Minimal bibasilar atelectasis is noted.  There is diffuse fatty infiltration within the liver, with  mild sparing about the gallbladder fossa. The spleen is unremarkable in appearance. A 2.4 cm stone is noted lodged at the neck of the gallbladder. Additional tiny stones are noted at the fundus of the gallbladder. There is question of trace pericholecystic fluid, but the gallbladder is otherwise unremarkable, without definite evidence of cholecystitis. The pancreas and adrenal glands are within normal limits.  The kidneys are unremarkable in appearance. There is no evidence of hydronephrosis. No renal or ureteral stones are seen. No perinephric stranding is appreciated.  No free fluid is identified. The small bowel is unremarkable in appearance. The stomach is within normal limits. No acute vascular abnormalities are seen. Minimal calcification is noted along the abdominal aorta.  The appendix is normal in caliber, without evidence for appendicitis. The colon is unremarkable in appearance.  The bladder is mildly distended and grossly unremarkable. The prostate is mildly enlarged, measuring 5.1 cm in transverse dimension. No inguinal lymphadenopathy is seen.  No acute osseous abnormalities are identified.  IMPRESSION: 1. No acute abnormality seen within the abdomen or pelvis. 2. 2.4 cm stone lodged at the neck of the gallbladder, with additional tiny stones at the fundus of the gallbladder. Question of trace pericholecystic fluid, but the gallbladder is otherwise unremarkable, without definite evidence for cholecystitis. 3. Diffuse fatty infiltration within the liver. 4. Mildly enlarged prostate.   Electronically Signed   By: Roanna Raider M.D.   On: 01/25/2013 23:55    0005:  ALT mildly elevated. Pt continues to c/o pain and nausea despite multiple doses of  IV meds. Dx and testing d/w pt and family.  Questions answered.  Verb understanding, agreeable to admit. T/C to General Surgery Dr. Lovell Sheehan, case discussed, including:  HPI, pertinent PM/SHx, VS/PE, dx testing, ED course and treatment:  Agreeable to admit,  requests to write temporary orders, OK for pt to eat, no need to start IV abx at this time.    Laray Anger, DO 01/26/13 1410

## 2013-01-26 ENCOUNTER — Encounter (HOSPITAL_COMMUNITY): Payer: Self-pay | Admitting: *Deleted

## 2013-01-26 MED ORDER — SODIUM CHLORIDE 0.9 % IV SOLN: INTRAVENOUS | Status: DC

## 2013-01-26 MED ORDER — ACETAMINOPHEN 325 MG PO TABS: 650.0000 mg | ORAL_TABLET | Freq: Four times a day (QID) | ORAL | Status: DC | PRN

## 2013-01-26 MED ORDER — ONDANSETRON HCL 4 MG/2ML IJ SOLN: 4.0000 mg | Freq: Three times a day (TID) | INTRAMUSCULAR | Status: DC | PRN

## 2013-01-26 MED ORDER — CHLORHEXIDINE GLUCONATE 4 % EX LIQD
1.0000 "application " | Freq: Once | CUTANEOUS | Status: AC
  Administered 2013-01-27: 1 via TOPICAL
  Filled 2013-01-26: qty 15

## 2013-01-26 MED ORDER — ENOXAPARIN SODIUM 40 MG/0.4ML ~~LOC~~ SOLN
40.0000 mg | SUBCUTANEOUS | Status: DC
  Administered 2013-01-26 – 2013-01-27 (×2): 40 mg via SUBCUTANEOUS
  Filled 2013-01-26: qty 0.4

## 2013-01-26 MED ORDER — DIPHENHYDRAMINE HCL 50 MG/ML IJ SOLN: 12.5000 mg | Freq: Four times a day (QID) | INTRAMUSCULAR | Status: DC | PRN

## 2013-01-26 MED ORDER — LEVOTHYROXINE SODIUM 100 MCG PO TABS: 200.0000 ug | ORAL_TABLET | Freq: Every day | ORAL | Status: DC

## 2013-01-26 MED ORDER — HYDROMORPHONE HCL PF 1 MG/ML IJ SOLN: 1.0000 mg | INTRAMUSCULAR | Status: DC | PRN

## 2013-01-26 MED ORDER — NICOTINE 21 MG/24HR TD PT24
21.0000 mg | MEDICATED_PATCH | Freq: Every day | TRANSDERMAL | Status: DC
  Administered 2013-01-26: 21 mg via TRANSDERMAL
  Filled 2013-01-26: qty 1

## 2013-01-26 MED ORDER — PANTOPRAZOLE SODIUM 40 MG IV SOLR
40.0000 mg | Freq: Every day | INTRAVENOUS | Status: DC
  Administered 2013-01-26: 40 mg via INTRAVENOUS
  Filled 2013-01-26: qty 40

## 2013-01-26 MED ORDER — DIPHENHYDRAMINE HCL 12.5 MG/5ML PO ELIX
12.5000 mg | ORAL_SOLUTION | Freq: Four times a day (QID) | ORAL | Status: DC | PRN
Start: 2013-01-26 — End: 2013-01-27

## 2013-01-26 MED ORDER — OXYCODONE HCL 5 MG PO TABS: 5.0000 mg | ORAL_TABLET | ORAL | Status: DC | PRN

## 2013-01-26 MED ORDER — KCL IN DEXTROSE-NACL 20-5-0.45 MEQ/L-%-% IV SOLN
INTRAVENOUS | Status: DC
  Administered 2013-01-26 (×2): via INTRAVENOUS

## 2013-01-26 MED ORDER — ONDANSETRON HCL 4 MG/2ML IJ SOLN: 4.0000 mg | Freq: Four times a day (QID) | INTRAMUSCULAR | Status: DC | PRN

## 2013-01-26 MED ORDER — SODIUM CHLORIDE 0.9 % IV SOLN
3.0000 g | Freq: Four times a day (QID) | INTRAVENOUS | Status: DC
  Administered 2013-01-26 – 2013-01-27 (×4): 3 g via INTRAVENOUS
  Filled 2013-01-26 (×9): qty 3

## 2013-01-26 MED ORDER — ACETAMINOPHEN 650 MG RE SUPP: 650.0000 mg | Freq: Four times a day (QID) | RECTAL | Status: DC | PRN

## 2013-01-26 MED ORDER — MORPHINE SULFATE 2 MG/ML IJ SOLN
1.0000 mg | INTRAMUSCULAR | Status: DC | PRN
  Administered 2013-01-26 (×2): 1 mg via INTRAVENOUS
  Filled 2013-01-26 (×2): qty 1

## 2013-01-26 NOTE — Plan of Care (Signed)
Medical Staff: Please disregard BS of 261 taken 11/16 1127 by Owens Loffler, NT.  BS was taken for room 325, scanned and confirmed, but when machine was docked - it uploaded to wrong pt?  BS for this pt was not checked at all!

## 2013-01-26 NOTE — H&P (Signed)
Devin [name]. is an 42 y.o. male.   Chief Complaint: Right upper quadrant abdominal pain HPI: Patient is a 42 year old white male status post umbilical herniorrhaphy with mesh in September of this year who presents with a several day history of worsening right upper quadrant abdominal pain. He states he has had this pain on and off over the past year. He has not had any emesis. He denies any fever, chills, or jaundice. CT scan of the abdomen revealed cholelithiasis with a 2.5 cm gallstone at the neck of the gallbladder. No biliary dilatation noted.  Past Medical History  Diagnosis Date  . Hypertension   . Hyperlipidemia   . GERD (gastroesophageal reflux disease)   . Hypothyroidism   . History of rhabdomyolysis   . History of kidney stones   . Vitamin D deficiency   . Chest pain   . Tobacco abuse   . Pericardial effusion     Small, by dobutamine echocardiogram, 04/2006  . Asthma   . Kidney calculus 2014    Past Surgical History  Procedure Laterality Date  . Varicocelectomy    . Percutaneous nephrolithotripsy    . Hernia repair      As a child  . Kidney stone surgery    . Umbilical hernia repair N/A 11/13/2012    Procedure: UMBILICAL HERNIORRHAPHY;  Surgeon: Dalia Heading, MD;  Location: [facility];  Service: General;  Laterality: N/A;  . Insertion of mesh N/A 11/13/2012    Procedure: INSERTION OF MESH;  Surgeon: Dalia Heading, MD;  Location: [facility];  Service: General;  Laterality: N/A;    Family History  Problem Relation Age of Onset  . Coronary artery disease Father   . Coronary artery disease Mother   . Coronary artery disease Maternal Grandfather     both grandfathers and several uncles  . Coronary artery disease    . Hypertension Father   . Hyperlipidemia Father   . Diabetes Father   . Diabetes Mother   . Diabetes Sister     both sisters  . Hypertension Mother   . Hyperlipidemia Mother    Social History:  reports that he has been smoking Cigarettes.  He started  smoking about 27 years ago. He has a 13 pack-year smoking history. He quit smokeless tobacco use about 25 years ago. His smokeless tobacco use included Snuff and Chew. He reports that he does not drink alcohol or use illicit drugs.  Allergies:  Allergies  Allergen Reactions  . Bee Venom Swelling  . Crestor [Rosuvastatin] Other (See Comments)    rhabdomyolosis    Medications Prior to Admission  Medication Sig Dispense Refill  . levothyroxine (SYNTHROID, LEVOTHROID) 200 MCG tablet Take 200 mcg by mouth daily before breakfast.      . ranitidine (ZANTAC) 150 MG tablet Take 150-300 mg by mouth daily.      . nitroGLYCERIN (NITROSTAT) 0.4 MG SL tablet Place 0.4 mg under the tongue every 5 (five) minutes as needed for chest pain.        Results for orders placed during the hospital encounter of 01/25/13 (from the past 48 hour(s))  CBC WITH DIFFERENTIAL     Status: Abnormal   Collection Time    01/25/13 10:06 PM      Result Value Range   WBC 11.2 (*) 4.0 - 10.5 K/uL   RBC 5.03  4.22 - 5.81 MIL/uL   Hemoglobin 15.1  13.0 - 17.0 g/dL   HCT 19.1  47.8 - 29.5 %  MCV 89.3  78.0 - 100.0 fL   MCH 30.0  26.0 - 34.0 pg   MCHC 33.6  30.0 - 36.0 g/dL   RDW 78.2  95.6 - 21.3 %   Platelets 245  150 - 400 K/uL   Neutrophils Relative % 71  43 - 77 %   Neutro Abs 7.9 (*) 1.7 - 7.7 K/uL   Lymphocytes Relative 22  12 - 46 %   Lymphs Abs 2.5  0.7 - 4.0 K/uL   Monocytes Relative 6  3 - 12 %   Monocytes Absolute 0.7  0.1 - 1.0 K/uL   Eosinophils Relative 1  0 - 5 %   Eosinophils Absolute 0.1  0.0 - 0.7 K/uL   Basophils Relative 0  0 - 1 %   Basophils Absolute 0.0  0.0 - 0.1 K/uL  COMPREHENSIVE METABOLIC PANEL     Status: Abnormal   Collection Time    01/25/13 10:06 PM      Result Value Range   Sodium 137  135 - 145 mEq/L   Potassium 3.9  3.5 - 5.1 mEq/L   Chloride 100  96 - 112 mEq/L   CO2 28  19 - 32 mEq/L   Glucose, Bld 155 (*) 70 - 99 mg/dL   BUN 18  6 - 23 mg/dL   Creatinine, Ser 0.86  0.50  - 1.35 mg/dL   Calcium 57.8  8.4 - 46.9 mg/dL   Total Protein 7.9  6.0 - 8.3 g/dL   Albumin 4.1  3.5 - 5.2 g/dL   AST 31  0 - 37 U/L   ALT 69 (*) 0 - 53 U/L   Alkaline Phosphatase 107  39 - 117 U/L   Total Bilirubin 0.3  0.3 - 1.2 mg/dL   GFR calc non Af Amer >90  >90 mL/min   GFR calc Af Amer >90  >90 mL/min   Comment: (NOTE)     The eGFR has been calculated using the CKD EPI equation.     This calculation has not been validated in all clinical situations.     eGFR's persistently <90 mL/min signify possible Chronic Kidney     Disease.  LIPASE, BLOOD     Status: None   Collection Time    01/25/13 10:06 PM      Result Value Range   Lipase 34  11 - 59 U/L   Dg Chest 2 View  01/25/2013   CLINICAL DATA:  Abdominal pain; status post hernia repair.  EXAM: CHEST  2 VIEW  COMPARISON:  CTA of the chest performed 10/03/2012  FINDINGS: The lungs are well-aerated. Pulmonary vascularity is at the upper limits of normal. Mild bibasilar opacities likely reflect atelectasis. No pleural effusion or pneumothorax is seen.  The heart is normal in size; the mediastinal contour is within normal limits. No acute osseous abnormalities are seen.  IMPRESSION: Mild bibasilar airspace opacities likely reflect atelectasis; lungs otherwise clear.   Electronically Signed   By: Roanna Raider M.D.   On: 01/25/2013 23:32   Ct Abdomen Pelvis W Contrast  01/25/2013   CLINICAL DATA:  Abdominal pain.  EXAM: CT ABDOMEN AND PELVIS WITH CONTRAST  TECHNIQUE: Multidetector CT imaging of the abdomen and pelvis was performed using the standard protocol following bolus administration of intravenous contrast.  CONTRAST:  50mL OMNIPAQUE IOHEXOL 300 MG/ML SOLN, OMNIPAQUE IOHEXOL 300 MG/ML SOLN  COMPARISON:  CT of the abdomen and pelvis performed 09/10/2012  FINDINGS: Minimal bibasilar atelectasis is noted.  There is diffuse fatty infiltration within the liver, with mild sparing about the gallbladder fossa. The spleen is  unremarkable in appearance. A 2.4 cm stone is noted lodged at the neck of the gallbladder. Additional tiny stones are noted at the fundus of the gallbladder. There is question of trace pericholecystic fluid, but the gallbladder is otherwise unremarkable, without definite evidence of cholecystitis. The pancreas and adrenal glands are within normal limits.  The kidneys are unremarkable in appearance. There is no evidence of hydronephrosis. No renal or ureteral stones are seen. No perinephric stranding is appreciated.  No free fluid is identified. The small bowel is unremarkable in appearance. The stomach is within normal limits. No acute vascular abnormalities are seen. Minimal calcification is noted along the abdominal aorta.  The appendix is normal in caliber, without evidence for appendicitis. The colon is unremarkable in appearance.  The bladder is mildly distended and grossly unremarkable. The prostate is mildly enlarged, measuring 5.1 cm in transverse dimension. No inguinal lymphadenopathy is seen.  No acute osseous abnormalities are identified.  IMPRESSION: 1. No acute abnormality seen within the abdomen or pelvis. 2. 2.4 cm stone lodged at the neck of the gallbladder, with additional tiny stones at the fundus of the gallbladder. Question of trace pericholecystic fluid, but the gallbladder is otherwise unremarkable, without definite evidence for cholecystitis. 3. Diffuse fatty infiltration within the liver. 4. Mildly enlarged prostate.   Electronically Signed   By: Roanna Raider M.D.   On: 01/25/2013 23:55    Review of Systems  Constitutional: Positive for malaise/fatigue.  HENT: Negative.   Eyes: Negative.   Respiratory: Negative.   Cardiovascular: Negative.   Gastrointestinal: Positive for heartburn, nausea and abdominal pain.  Genitourinary: Negative.   Musculoskeletal: Negative.   Skin: Negative.   Neurological: Negative.   Endo/Heme/Allergies: Negative.     Blood pressure 154/84, pulse  72, temperature 98.1 F (36.7 C), temperature source Oral, resp. rate 18, height 6\' 6"  (1.981 m), weight 132.632 kg (292 lb 6.4 oz), SpO2 98.00%. Physical Exam  Constitutional: He is oriented to person, place, and time. He appears well-developed and well-nourished.  HENT:  Head: Normocephalic and atraumatic.  Eyes: No scleral icterus.  Neck: Normal range of motion. Neck supple.  Cardiovascular: Normal rate, regular rhythm and normal heart sounds.   Respiratory: Effort normal and breath sounds normal.  GI: Soft. He exhibits no distension. There is tenderness. There is no rebound.  Tender in the right upper quadrant to deep palpation. No rigidity noted. Previous umbilical hernia repair intact.  Neurological: He is alert and oriented to person, place, and time.  Skin: Skin is warm and dry.     Assessment/Plan Impression: Cholecystitis, cholelithiasis Plan: Patient be brought in to the hospital for IV antibiotics, pain control, and nausea control. He was up to undergo a laparoscopic cholecystectomy. The risks and benefits of the procedure including bleeding, infection, hepatobiliary, and the possibility of an open procedure were fully explained to the patient, who gave informed consent.  Yana Schorr A 01/26/2013, 8:48 AM

## 2013-01-27 ENCOUNTER — Encounter (HOSPITAL_COMMUNITY): Payer: BC Managed Care – PPO | Admitting: Anesthesiology

## 2013-01-27 ENCOUNTER — Encounter (HOSPITAL_COMMUNITY): Payer: Self-pay | Admitting: Anesthesiology

## 2013-01-27 ENCOUNTER — Encounter (HOSPITAL_COMMUNITY)
Admission: EM | Disposition: A | Discharge status: Home / Self Care | Payer: Self-pay | Source: Home / Self Care | Attending: General Surgery

## 2013-01-27 ENCOUNTER — Observation Stay (HOSPITAL_COMMUNITY): Payer: BC Managed Care – PPO | Admitting: Anesthesiology

## 2013-01-27 HISTORY — PX: CHOLECYSTECTOMY: SHX55

## 2013-01-27 LAB — GLUCOSE, CAPILLARY: Glucose-Capillary: 125 mg/dL — ABNORMAL HIGH (ref 70–99)

## 2013-01-27 SURGERY — LAPAROSCOPIC CHOLECYSTECTOMY
Anesthesia: General | Site: Abdomen | Wound class: Contaminated

## 2013-01-27 MED ORDER — MUPIROCIN 2 % EX OINT
1.0000 "application " | TOPICAL_OINTMENT | Freq: Two times a day (BID) | CUTANEOUS | Status: DC
  Administered 2013-01-27: 1 via NASAL
  Filled 2013-01-27: qty 22

## 2013-01-27 MED ORDER — ONDANSETRON HCL 4 MG PO TABS: 4.0000 mg | ORAL_TABLET | Freq: Four times a day (QID) | ORAL | Status: DC | PRN

## 2013-01-27 MED ORDER — BUPIVACAINE HCL (PF) 0.5 % IJ SOLN
INTRAMUSCULAR | Status: AC
  Filled 2013-01-27: qty 30

## 2013-01-27 MED ORDER — MORPHINE SULFATE 2 MG/ML IJ SOLN: 2.0000 mg | INTRAMUSCULAR | Status: DC | PRN

## 2013-01-27 MED ORDER — DEXAMETHASONE SODIUM PHOSPHATE 4 MG/ML IJ SOLN
INTRAMUSCULAR | Status: AC
  Filled 2013-01-27: qty 1

## 2013-01-27 MED ORDER — ROCURONIUM BROMIDE 50 MG/5ML IV SOLN
INTRAVENOUS | Status: AC
  Filled 2013-01-27: qty 1

## 2013-01-27 MED ORDER — MIDAZOLAM HCL 2 MG/2ML IJ SOLN
INTRAMUSCULAR | Status: AC
  Filled 2013-01-27: qty 2

## 2013-01-27 MED ORDER — PROPOFOL 10 MG/ML IV BOLUS
INTRAVENOUS | Status: AC
  Filled 2013-01-27: qty 20

## 2013-01-27 MED ORDER — KETOROLAC TROMETHAMINE 30 MG/ML IJ SOLN
30.0000 mg | Freq: Once | INTRAMUSCULAR | Status: AC
  Administered 2013-01-27: 30 mg via INTRAVENOUS
  Filled 2013-01-27: qty 1

## 2013-01-27 MED ORDER — SUCCINYLCHOLINE CHLORIDE 20 MG/ML IJ SOLN
INTRAMUSCULAR | Status: AC
  Filled 2013-01-27: qty 1

## 2013-01-27 MED ORDER — PROPOFOL 10 MG/ML IV BOLUS
INTRAVENOUS | Status: DC | PRN
  Administered 2013-01-27: 160 mg via INTRAVENOUS

## 2013-01-27 MED ORDER — ONDANSETRON HCL 4 MG/2ML IJ SOLN
4.0000 mg | Freq: Once | INTRAMUSCULAR | Status: AC
  Administered 2013-01-27: 4 mg via INTRAVENOUS

## 2013-01-27 MED ORDER — MIDAZOLAM HCL 2 MG/2ML IJ SOLN
1.0000 mg | INTRAMUSCULAR | Status: DC | PRN
  Administered 2013-01-27: 2 mg via INTRAVENOUS

## 2013-01-27 MED ORDER — NEOSTIGMINE METHYLSULFATE 1 MG/ML IJ SOLN
INTRAMUSCULAR | Status: AC
  Filled 2013-01-27: qty 1

## 2013-01-27 MED ORDER — BUPIVACAINE HCL (PF) 0.5 % IJ SOLN
INTRAMUSCULAR | Status: DC | PRN
  Administered 2013-01-27: 10 mL

## 2013-01-27 MED ORDER — LACTATED RINGERS IV SOLN: INTRAVENOUS | Status: DC

## 2013-01-27 MED ORDER — GLYCOPYRROLATE 0.2 MG/ML IJ SOLN
INTRAMUSCULAR | Status: AC
  Filled 2013-01-27: qty 1

## 2013-01-27 MED ORDER — 0.9 % SODIUM CHLORIDE (POUR BTL) OPTIME
TOPICAL | Status: DC | PRN
  Administered 2013-01-27: 1000 mL

## 2013-01-27 MED ORDER — ONDANSETRON HCL 4 MG/2ML IJ SOLN: 4.0000 mg | Freq: Four times a day (QID) | INTRAMUSCULAR | Status: DC | PRN

## 2013-01-27 MED ORDER — GLYCOPYRROLATE 0.2 MG/ML IJ SOLN
INTRAMUSCULAR | Status: AC
  Filled 2013-01-27: qty 3

## 2013-01-27 MED ORDER — OXYCODONE-ACETAMINOPHEN 7.5-325 MG PO TABS: 1.0000 | ORAL_TABLET | ORAL | Status: DC | PRN

## 2013-01-27 MED ORDER — ROCURONIUM BROMIDE 100 MG/10ML IV SOLN
INTRAVENOUS | Status: DC | PRN
  Administered 2013-01-27: 10 mg via INTRAVENOUS
  Administered 2013-01-27: 20 mg via INTRAVENOUS
  Administered 2013-01-27: 30 mg via INTRAVENOUS

## 2013-01-27 MED ORDER — FENTANYL CITRATE 0.05 MG/ML IJ SOLN
INTRAMUSCULAR | Status: DC | PRN
  Administered 2013-01-27: 50 ug via INTRAVENOUS
  Administered 2013-01-27: 100 ug via INTRAVENOUS
  Administered 2013-01-27 (×7): 50 ug via INTRAVENOUS

## 2013-01-27 MED ORDER — FENTANYL CITRATE 0.05 MG/ML IJ SOLN
INTRAMUSCULAR | Status: AC
  Filled 2013-01-27: qty 5

## 2013-01-27 MED ORDER — LACTATED RINGERS IV SOLN
INTRAVENOUS | Status: DC
  Administered 2013-01-27: 09:00:00 via INTRAVENOUS

## 2013-01-27 MED ORDER — SODIUM CHLORIDE 0.9 % IR SOLN
Status: DC | PRN
  Administered 2013-01-27: 3000 mL

## 2013-01-27 MED ORDER — GLYCOPYRROLATE 0.2 MG/ML IJ SOLN
INTRAMUSCULAR | Status: DC | PRN
  Administered 2013-01-27: 0.6 mg via INTRAVENOUS

## 2013-01-27 MED ORDER — GLYCOPYRROLATE 0.2 MG/ML IJ SOLN
0.2000 mg | Freq: Once | INTRAMUSCULAR | Status: AC
  Administered 2013-01-27: 0.2 mg via INTRAVENOUS

## 2013-01-27 MED ORDER — LIDOCAINE HCL (PF) 1 % IJ SOLN
INTRAMUSCULAR | Status: AC
  Filled 2013-01-27: qty 5

## 2013-01-27 MED ORDER — ENOXAPARIN SODIUM 40 MG/0.4ML ~~LOC~~ SOLN: 40.0000 mg | SUBCUTANEOUS | Status: DC

## 2013-01-27 MED ORDER — LIDOCAINE HCL (CARDIAC) 20 MG/ML IV SOLN
INTRAVENOUS | Status: DC | PRN
  Administered 2013-01-27: 50 mg via INTRAVENOUS

## 2013-01-27 MED ORDER — SUCCINYLCHOLINE CHLORIDE 20 MG/ML IJ SOLN
INTRAMUSCULAR | Status: DC | PRN
  Administered 2013-01-27: 140 mg via INTRAVENOUS

## 2013-01-27 MED ORDER — ARTIFICIAL TEARS OP OINT
TOPICAL_OINTMENT | OPHTHALMIC | Status: AC
  Filled 2013-01-27: qty 3.5

## 2013-01-27 MED ORDER — ONDANSETRON HCL 4 MG/2ML IJ SOLN: 4.0000 mg | Freq: Once | INTRAMUSCULAR | Status: DC | PRN

## 2013-01-27 MED ORDER — DEXAMETHASONE SODIUM PHOSPHATE 4 MG/ML IJ SOLN
4.0000 mg | Freq: Once | INTRAMUSCULAR | Status: AC
  Administered 2013-01-27: 4 mg via INTRAVENOUS

## 2013-01-27 MED ORDER — HEMOSTATIC AGENTS (NO CHARGE) OPTIME
TOPICAL | Status: DC | PRN
  Administered 2013-01-27: 1 via TOPICAL

## 2013-01-27 MED ORDER — FENTANYL CITRATE 0.05 MG/ML IJ SOLN
25.0000 ug | INTRAMUSCULAR | Status: DC | PRN
  Administered 2013-01-27: 50 ug via INTRAVENOUS
  Filled 2013-01-27: qty 2

## 2013-01-27 MED ORDER — CHLORHEXIDINE GLUCONATE CLOTH 2 % EX PADS: 6.0000 | MEDICATED_PAD | Freq: Every day | CUTANEOUS | Status: DC

## 2013-01-27 MED ORDER — ONDANSETRON HCL 4 MG/2ML IJ SOLN
INTRAMUSCULAR | Status: AC
  Filled 2013-01-27: qty 2

## 2013-01-27 MED ORDER — LACTATED RINGERS IV SOLN
INTRAVENOUS | Status: DC | PRN
  Administered 2013-01-27 (×2): via INTRAVENOUS

## 2013-01-27 MED ORDER — NEOSTIGMINE METHYLSULFATE 1 MG/ML IJ SOLN
INTRAMUSCULAR | Status: DC | PRN
  Administered 2013-01-27: 4 mg via INTRAVENOUS

## 2013-01-27 MED ORDER — ENOXAPARIN SODIUM 40 MG/0.4ML ~~LOC~~ SOLN
SUBCUTANEOUS | Status: AC
  Filled 2013-01-27: qty 0.4

## 2013-01-27 SURGICAL SUPPLY — 41 items
APPLIER CLIP LAPSCP 10X32 DD (CLIP) ×2 IMPLANT
BAG HAMPER (MISCELLANEOUS) ×2 IMPLANT
CLOTH BEACON ORANGE TIMEOUT ST (SAFETY) ×2 IMPLANT
COVER LIGHT HANDLE STERIS (MISCELLANEOUS) ×4 IMPLANT
DECANTER SPIKE VIAL GLASS SM (MISCELLANEOUS) ×2 IMPLANT
DURAPREP 26ML APPLICATOR (WOUND CARE) ×2 IMPLANT
ELECT REM PT RETURN 9FT ADLT (ELECTROSURGICAL) ×2
ELECTRODE REM PT RTRN 9FT ADLT (ELECTROSURGICAL) ×1 IMPLANT
FILTER SMOKE EVAC LAPAROSHD (FILTER) ×2 IMPLANT
FORMALIN 10 PREFIL 120ML (MISCELLANEOUS) ×2 IMPLANT
GLOVE BIOGEL PI IND STRL 7.0 (GLOVE) ×2 IMPLANT
GLOVE BIOGEL PI IND STRL 8 (GLOVE) ×1 IMPLANT
GLOVE BIOGEL PI INDICATOR 7.0 (GLOVE) ×2
GLOVE BIOGEL PI INDICATOR 8 (GLOVE) ×1
GLOVE ECLIPSE 6.5 STRL STRAW (GLOVE) ×2 IMPLANT
GLOVE ECLIPSE 7.0 STRL STRAW (GLOVE) ×2 IMPLANT
GLOVE ECLIPSE 7.5 STRL STRAW (GLOVE) ×2 IMPLANT
GOWN STRL REIN XL XLG (GOWN DISPOSABLE) ×6 IMPLANT
HEMOSTAT SNOW SURGICEL 2X4 (HEMOSTASIS) ×2 IMPLANT
INST SET LAPROSCOPIC AP (KITS) ×2 IMPLANT
IV NS IRRIG 3000ML ARTHROMATIC (IV SOLUTION) ×2 IMPLANT
KIT ROOM TURNOVER APOR (KITS) ×2 IMPLANT
MANIFOLD NEPTUNE II (INSTRUMENTS) ×2 IMPLANT
NEEDLE INSUFFLATION 14GA 120MM (NEEDLE) ×2 IMPLANT
NS IRRIG 1000ML POUR BTL (IV SOLUTION) ×2 IMPLANT
PACK LAP CHOLE LZT030E (CUSTOM PROCEDURE TRAY) ×2 IMPLANT
PAD ARMBOARD 7.5X6 YLW CONV (MISCELLANEOUS) ×2 IMPLANT
POUCH SPECIMEN RETRIEVAL 10MM (ENDOMECHANICALS) ×2 IMPLANT
SET BASIN LINEN APH (SET/KITS/TRAYS/PACK) ×2 IMPLANT
SET TUBE IRRIG SUCTION NO TIP (IRRIGATION / IRRIGATOR) ×2 IMPLANT
SLEEVE ENDOPATH XCEL 5M (ENDOMECHANICALS) ×2 IMPLANT
SPONGE GAUZE 2X2 8PLY STRL LF (GAUZE/BANDAGES/DRESSINGS) ×8 IMPLANT
STAPLER VISISTAT (STAPLE) ×2 IMPLANT
SUT VICRYL 0 UR6 27IN ABS (SUTURE) ×2 IMPLANT
TAPE CLOTH SURG 4X10 WHT LF (GAUZE/BANDAGES/DRESSINGS) ×2 IMPLANT
TROCAR ENDO BLADELESS 11MM (ENDOMECHANICALS) ×2 IMPLANT
TROCAR XCEL NON-BLD 5MMX100MML (ENDOMECHANICALS) ×2 IMPLANT
TROCAR XCEL UNIV SLVE 11M 100M (ENDOMECHANICALS) ×2 IMPLANT
TUBING INSUFFLATION (TUBING) ×2 IMPLANT
WARMER LAPAROSCOPE (MISCELLANEOUS) ×2 IMPLANT
YANKAUER SUCT 12FT TUBE ARGYLE (SUCTIONS) ×2 IMPLANT

## 2013-01-27 NOTE — Anesthesia Postprocedure Evaluation (Signed)
  Anesthesia Post-op Note  Patient: Devin [name].  Procedure(s) Performed: Procedure(s): LAPAROSCOPIC CHOLECYSTECTOMY (N/A)  Patient Location: PACU  Anesthesia Type:General  Level of Consciousness: sedated and patient cooperative  Airway and Oxygen Therapy: Patient Spontanous Breathing and Patient connected to face mask oxygen  Post-op Pain: mild  Post-op Assessment: Post-op Vital signs reviewed, Patient's Cardiovascular Status Stable, Respiratory Function Stable, Patent Airway, No signs of Nausea or vomiting and Pain level controlled  Post-op Vital Signs: Reviewed and stable  Complications: No apparent anesthesia complications

## 2013-01-27 NOTE — Op Note (Signed)
Patient:  Devin [name].  DOB:  1970-12-05  MRN:  [id number]   Preop Diagnosis:  Acute cholecystitis, cholelithiasis  Postop Diagnosis:  Same  Procedure:  Laparoscopic cholecystectomy  Surgeon:  Franky Macho, M.D.  Anes:  General endotracheal  Indications:  Patient is a 42 year old white male who presents with a several day history of worsening right upper quadrant abdominal pain. CT scan of the abdomen reveals acute cholecystitis with cholelithiasis. The risks and benefits of the procedure including bleeding, infection, hepatobiliary, and the possibility of an open procedure were fully explained to the patient, who gave informed consent.  Procedure note:  The patient is placed the supine position. After induction of general endotracheal anesthesia, the abdomen was prepped and draped using usual sterile technique with DuraPrep. Surgical site confirmation was performed.  A supraumbilical incision was made down the fascia. A Veress needle was introduced into the abdominal cavity and confirmation of placement was done using the saline drop test. The abdomen was then insufflated to 16 mm mercury pressure. An 11 mm trocar was introduced into the abdominal cavity under direct visualization without difficulty. The patient was placed in reverse Trendelenburg position and additional 11 mm trocar was placed the epigastric region and 5 mm trochars were placed the right upper quadrant and right flank regions. Liver was inspected and noted to be within normal limits. The gallbladder was noted to be tense with a thickened gallbladder wall. The gallbladder wall was inflamed. An incision was made into the fundus of the gallbladder in order to decompress it. This allowed adequate retraction in a dynamic fashion in order to expose the triangle of Calot. The cystic duct was first identified. Its juncture to the infundibulum was fully identified. Endoclips were placed proximally and distally on the cystic duct,  and the cystic duct was divided. This was likewise done cystic artery. The gallbladder was then freed away from the gallbladder fossa using Bovie electrocautery. The gallbladder was delivered through the epigastric trocar site using an Endo Catch bag. The gallbladder fossa was inspected and no abnormal bleeding or bile leakage was noted. Surgicel is placed the gallbladder fossa. All fluid and air were then evacuated from the abdominal cavity prior to removal of the trochars.  All wounds were irrigated with normal saline. All wounds were checked with 0.5% Sensorcaine. The epigastric fascia were reapproximated using 0 Vicryl interrupted sutures. All skin incisions were closed using staples. Betadine ointment and dressed a dressings were applied.  All tape and needle counts were correct at the end of the procedure. Patient was extubated in the operating room and transferred to PACU in stable condition.  Complications:  None  EBL:  Minimal  Specimen:  Gallbladder

## 2013-01-27 NOTE — Anesthesia Procedure Notes (Signed)
Procedure Name: Intubation Date/Time: 01/27/2013 8:55 AM Performed by: Pernell Dupre, AMY A Pre-anesthesia Checklist: Patient identified, Patient being monitored, Timeout performed, Emergency Drugs available and Suction available Patient Re-evaluated:Patient Re-evaluated prior to inductionOxygen Delivery Method: Circle System Utilized Preoxygenation: Pre-oxygenation with 100% oxygen Intubation Type: IV induction, Rapid sequence and Cricoid Pressure applied Grade View: Grade I Tube type: Oral Tube size: 8.0 mm Number of attempts: 1 Airway Equipment and Method: stylet and Video-laryngoscopy Placement Confirmation: ETT inserted through vocal cords under direct vision,  positive ETCO2 and breath sounds checked- equal and bilateral Secured at: 21 cm Tube secured with: Tape Dental Injury: Teeth and Oropharynx as per pre-operative assessment  Difficulty Due To: Difficulty was anticipated

## 2013-01-27 NOTE — Transfer of Care (Signed)
Immediate Anesthesia Transfer of Care Note  Patient: Devin [name].  Procedure(s) Performed: Procedure(s): LAPAROSCOPIC CHOLECYSTECTOMY (N/A)  Patient Location: PACU  Anesthesia Type:General  Level of Consciousness: sedated and patient cooperative  Airway & Oxygen Therapy: Patient Spontanous Breathing and Patient connected to face mask oxygen  Post-op Assessment: Report given to PACU RN and Post -op Vital signs reviewed and stable  Post vital signs: Reviewed and stable  Complications: No apparent anesthesia complications

## 2013-01-27 NOTE — Preoperative (Signed)
Beta Blockers   Reason not to administer Beta Blockers:Not Applicable 

## 2013-01-27 NOTE — Anesthesia Preprocedure Evaluation (Addendum)
Anesthesia Evaluation  Patient identified by MRN, date of birth, ID band Patient awake    Reviewed: Allergy & Precautions, H&P , NPO status , Patient's Chart, lab work & pertinent test results  Airway Mallampati: III TM Distance: >3 FB Neck ROM: Full    Dental  (+) Partial Upper   Pulmonary asthma , Current Smoker,  breath sounds clear to auscultation        Cardiovascular hypertension, Pt. on medications Rhythm:Regular Rate:Normal     Neuro/Psych    GI/Hepatic GERD-  Medicated,  Endo/Other  diabetes (new onset), Type 2Hypothyroidism   Renal/GU Renal disease     Musculoskeletal   Abdominal   Peds  Hematology   Anesthesia Other Findings   Reproductive/Obstetrics                           Anesthesia Physical Anesthesia Plan  ASA: III  Anesthesia Plan: General   Post-op Pain Management:    Induction: Intravenous, Rapid sequence and Cricoid pressure planned  Airway Management Planned: Oral ETT and Video Laryngoscope Planned  Additional Equipment:   Intra-op Plan:   Post-operative Plan: Extubation in OR  Informed Consent: I have reviewed the patients History and Physical, chart, labs and discussed the procedure including the risks, benefits and alternatives for the proposed anesthesia with the patient or authorized representative who has indicated his/her understanding and acceptance.     Plan Discussed with:   Anesthesia Plan Comments:        Anesthesia Quick Evaluation

## 2013-01-29 ENCOUNTER — Encounter (HOSPITAL_COMMUNITY): Payer: Self-pay | Admitting: General Surgery

## 2013-01-31 NOTE — Discharge Summary (Signed)
Physician Discharge Summary  Patient ID: Devin [name]. MRN: [id number] DOB/AGE: [birth year] 41 y.o.  Admit date: 01/25/2013 Discharge date: 01/27/2013 Admission Diagnoses: Cholecystitis, cholelithiasis  Discharge Diagnoses: Same Active Problems:   * No active hospital problems. *   Discharged Condition: good  Hospital Course: Patient is a 42 year old white male who is recently undergone an umbilical herniorrhaphy with mesh who presented emergency room with worsening right upper quadrant abdominal pain. CT scan of the abdomen revealed acute cholecystitis with cholelithiasis. The patient was admitted to the hospital for IV antibiotics and control of his pain and nausea. He subsequently underwent laparoscopic cholecystectomy on 01/27/2013. Tolerated the procedure well. His diet was advanced without difficulty. He was discharged home later that day in good improving condition.  Treatments: surgery: Laparoscopic cholecystectomy on 01/27/2013  Discharge Exam: Blood pressure 111/56, pulse 63, temperature 97.7 F (36.5 C), temperature source Oral, resp. rate 17, height 6\' 6"  (1.981 m), weight 136.079 kg (300 lb), SpO2 92.00%. General appearance: alert and cooperative Resp: clear to auscultation bilaterally Cardio: regular rate and rhythm, S1, S2 normal, no murmur, click, rub or gallop GI: Soft. Dressings dry and intact.  Disposition: 01-Home or Self Care     Medication List         levothyroxine 200 MCG tablet  Commonly known as:  SYNTHROID, LEVOTHROID  Take 200 mcg by mouth daily before breakfast.     nitroGLYCERIN 0.4 MG SL tablet  Commonly known as:  NITROSTAT  Place 0.4 mg under the tongue every 5 (five) minutes as needed for chest pain.     oxyCODONE-acetaminophen 7.5-325 MG per tablet  Commonly known as:  PERCOCET  Take 1-2 tablets by mouth every 4 (four) hours as needed.     ranitidine 150 MG tablet  Commonly known as:  ZANTAC  Take 150-300 mg by mouth daily.           Follow-up Information   Follow up with Dalia Heading, MD On 02/04/2013. (at 11:30 am)    Specialty:  General Surgery   Contact information:   [address] [place] [address] [phone]       Signed: Franky Macho A 01/31/2013, 9:45 AM

## 2013-10-06 ENCOUNTER — Inpatient Hospital Stay (HOSPITAL_COMMUNITY)
Admission: EM | Admit: 2013-10-06 | Discharge: 2013-10-08 | DRG: 683 | Disposition: A | Discharge status: Home / Self Care | Payer: Self-pay | Attending: Internal Medicine | Admitting: Internal Medicine

## 2013-10-06 ENCOUNTER — Encounter (HOSPITAL_COMMUNITY): Payer: Self-pay | Admitting: Emergency Medicine

## 2013-10-06 ENCOUNTER — Emergency Department (HOSPITAL_COMMUNITY): Payer: Self-pay

## 2013-10-06 DIAGNOSIS — T380X5A Adverse effect of glucocorticoids and synthetic analogues, initial encounter: Secondary | ICD-10-CM | POA: Diagnosis present

## 2013-10-06 DIAGNOSIS — Z833 Family history of diabetes mellitus: Secondary | ICD-10-CM

## 2013-10-06 DIAGNOSIS — R7401 Elevation of levels of liver transaminase levels: Secondary | ICD-10-CM | POA: Diagnosis present

## 2013-10-06 DIAGNOSIS — Z87442 Personal history of urinary calculi: Secondary | ICD-10-CM

## 2013-10-06 DIAGNOSIS — Z720 Tobacco use: Secondary | ICD-10-CM | POA: Diagnosis present

## 2013-10-06 DIAGNOSIS — E1159 Type 2 diabetes mellitus with other circulatory complications: Secondary | ICD-10-CM | POA: Diagnosis present

## 2013-10-06 DIAGNOSIS — Z91199 Patient's noncompliance with other medical treatment and regimen due to unspecified reason: Secondary | ICD-10-CM

## 2013-10-06 DIAGNOSIS — Z8739 Personal history of other diseases of the musculoskeletal system and connective tissue: Secondary | ICD-10-CM

## 2013-10-06 DIAGNOSIS — E871 Hypo-osmolality and hyponatremia: Secondary | ICD-10-CM | POA: Diagnosis not present

## 2013-10-06 DIAGNOSIS — M6282 Rhabdomyolysis: Secondary | ICD-10-CM

## 2013-10-06 DIAGNOSIS — Z9119 Patient's noncompliance with other medical treatment and regimen: Secondary | ICD-10-CM

## 2013-10-06 DIAGNOSIS — I10 Essential (primary) hypertension: Secondary | ICD-10-CM | POA: Diagnosis present

## 2013-10-06 DIAGNOSIS — R748 Abnormal levels of other serum enzymes: Secondary | ICD-10-CM

## 2013-10-06 DIAGNOSIS — E119 Type 2 diabetes mellitus without complications: Secondary | ICD-10-CM

## 2013-10-06 DIAGNOSIS — E782 Mixed hyperlipidemia: Secondary | ICD-10-CM | POA: Diagnosis present

## 2013-10-06 DIAGNOSIS — E039 Hypothyroidism, unspecified: Secondary | ICD-10-CM

## 2013-10-06 DIAGNOSIS — D72829 Elevated white blood cell count, unspecified: Secondary | ICD-10-CM | POA: Diagnosis present

## 2013-10-06 DIAGNOSIS — E785 Hyperlipidemia, unspecified: Secondary | ICD-10-CM

## 2013-10-06 DIAGNOSIS — E860 Dehydration: Secondary | ICD-10-CM

## 2013-10-06 DIAGNOSIS — F17200 Nicotine dependence, unspecified, uncomplicated: Secondary | ICD-10-CM | POA: Diagnosis present

## 2013-10-06 DIAGNOSIS — K219 Gastro-esophageal reflux disease without esophagitis: Secondary | ICD-10-CM | POA: Diagnosis present

## 2013-10-06 DIAGNOSIS — N179 Acute kidney failure, unspecified: Principal | ICD-10-CM

## 2013-10-06 DIAGNOSIS — Z8249 Family history of ischemic heart disease and other diseases of the circulatory system: Secondary | ICD-10-CM

## 2013-10-06 DIAGNOSIS — J45909 Unspecified asthma, uncomplicated: Secondary | ICD-10-CM | POA: Diagnosis present

## 2013-10-06 DIAGNOSIS — R740 Nonspecific elevation of levels of transaminase and lactic acid dehydrogenase [LDH]: Secondary | ICD-10-CM

## 2013-10-06 DIAGNOSIS — F17210 Nicotine dependence, cigarettes, uncomplicated: Secondary | ICD-10-CM | POA: Diagnosis present

## 2013-10-06 DIAGNOSIS — IMO0001 Reserved for inherently not codable concepts without codable children: Secondary | ICD-10-CM

## 2013-10-06 HISTORY — DX: Type 2 diabetes mellitus without complications: E11.9

## 2013-10-06 LAB — URINALYSIS, ROUTINE W REFLEX MICROSCOPIC
Bilirubin Urine: NEGATIVE
Glucose, UA: NEGATIVE mg/dL
Ketones, ur: NEGATIVE mg/dL
LEUKOCYTES UA: NEGATIVE
Nitrite: NEGATIVE
PROTEIN: 30 mg/dL — AB
Specific Gravity, Urine: 1.02 (ref 1.005–1.030)
UROBILINOGEN UA: 0.2 mg/dL (ref 0.0–1.0)
pH: 6.5 (ref 5.0–8.0)

## 2013-10-06 LAB — CBC
HCT: 43.7 % (ref 39.0–52.0)
Hemoglobin: 14.8 g/dL (ref 13.0–17.0)
MCH: 31.2 pg (ref 26.0–34.0)
MCHC: 33.9 g/dL (ref 30.0–36.0)
MCV: 92 fL (ref 78.0–100.0)
Platelets: 200 10*3/uL (ref 150–400)
RBC: 4.75 MIL/uL (ref 4.22–5.81)
RDW: 16.6 % — ABNORMAL HIGH (ref 11.5–15.5)
WBC: 12.2 10*3/uL — ABNORMAL HIGH (ref 4.0–10.5)

## 2013-10-06 LAB — BASIC METABOLIC PANEL
ANION GAP: 16 — AB (ref 5–15)
BUN: 25 mg/dL — ABNORMAL HIGH (ref 6–23)
CHLORIDE: 96 meq/L (ref 96–112)
CO2: 26 meq/L (ref 19–32)
CREATININE: 1.77 mg/dL — AB (ref 0.50–1.35)
Calcium: 10.5 mg/dL (ref 8.4–10.5)
GFR calc Af Amer: 53 mL/min — ABNORMAL LOW (ref >90)
GFR calc non Af Amer: 46 mL/min — ABNORMAL LOW (ref >90)
Glucose, Bld: 113 mg/dL — ABNORMAL HIGH (ref 70–99)
Potassium: 4.2 mEq/L (ref 3.7–5.3)
Sodium: 138 mEq/L (ref 137–147)

## 2013-10-06 LAB — T4, FREE: Free T4: 0.24 ng/dL — ABNORMAL LOW (ref 0.80–1.80)

## 2013-10-06 LAB — URINE MICROSCOPIC-ADD ON

## 2013-10-06 LAB — CK
CK TOTAL: 9196 U/L — AB (ref 7–232)
Total CK: 9831 U/L — ABNORMAL HIGH (ref 7–232)

## 2013-10-06 LAB — CBC WITH DIFFERENTIAL/PLATELET
Basophils Absolute: 0.1 10*3/uL (ref 0.0–0.1)
Basophils Relative: 0 % (ref 0–1)
Eosinophils Absolute: 0.3 10*3/uL (ref 0.0–0.7)
Eosinophils Relative: 2 % (ref 0–5)
HEMATOCRIT: 43.1 % (ref 39.0–52.0)
Hemoglobin: 14.8 g/dL (ref 13.0–17.0)
LYMPHS PCT: 37 % (ref 12–46)
Lymphs Abs: 4.3 10*3/uL — ABNORMAL HIGH (ref 0.7–4.0)
MCH: 31.6 pg (ref 26.0–34.0)
MCHC: 34.3 g/dL (ref 30.0–36.0)
MCV: 92.1 fL (ref 78.0–100.0)
MONOS PCT: 4 % (ref 3–12)
Monocytes Absolute: 0.5 10*3/uL (ref 0.1–1.0)
NEUTROS ABS: 6.5 10*3/uL (ref 1.7–7.7)
Neutrophils Relative %: 57 % (ref 43–77)
Platelets: 201 10*3/uL (ref 150–400)
RBC: 4.68 MIL/uL (ref 4.22–5.81)
RDW: 16.6 % — ABNORMAL HIGH (ref 11.5–15.5)
WBC: 11.6 10*3/uL — AB (ref 4.0–10.5)

## 2013-10-06 LAB — CREATININE, SERUM
Creatinine, Ser: 1.63 mg/dL — ABNORMAL HIGH (ref 0.50–1.35)
GFR calc Af Amer: 59 mL/min — ABNORMAL LOW (ref >90)
GFR calc non Af Amer: 51 mL/min — ABNORMAL LOW (ref >90)

## 2013-10-06 LAB — HEPATIC FUNCTION PANEL
ALBUMIN: 4.7 g/dL (ref 3.5–5.2)
ALT: 72 U/L — ABNORMAL HIGH (ref 0–53)
AST: 112 U/L — AB (ref 0–37)
Alkaline Phosphatase: 85 U/L (ref 39–117)
Bilirubin, Direct: <0.2 mg/dL (ref 0.0–0.3)
Total Bilirubin: 0.3 mg/dL (ref 0.3–1.2)
Total Protein: 8.3 g/dL (ref 6.0–8.3)

## 2013-10-06 LAB — RAPID URINE DRUG SCREEN, HOSP PERFORMED
AMPHETAMINES: NOT DETECTED
BENZODIAZEPINES: NOT DETECTED
Barbiturates: NOT DETECTED
Cocaine: NOT DETECTED
Opiates: NOT DETECTED
Tetrahydrocannabinol: NOT DETECTED

## 2013-10-06 LAB — HEMOGLOBIN A1C
HEMOGLOBIN A1C: 7 % — AB (ref <5.7)
Mean Plasma Glucose: 154 mg/dL — ABNORMAL HIGH (ref <117)

## 2013-10-06 LAB — TROPONIN I: Troponin I: <0.3 ng/mL (ref <0.30)

## 2013-10-06 LAB — TSH

## 2013-10-06 LAB — I-STAT CG4 LACTIC ACID, ED: Lactic Acid, Venous: 1.31 mmol/L (ref 0.5–2.2)

## 2013-10-06 LAB — PRO B NATRIURETIC PEPTIDE: Pro B Natriuretic peptide (BNP): 13.2 pg/mL (ref 0–125)

## 2013-10-06 LAB — SEDIMENTATION RATE: Sed Rate: 12 mm/hr (ref 0–16)

## 2013-10-06 LAB — RHEUMATOID FACTOR: Rhuematoid fact SerPl-aCnc: <10 IU/mL (ref <=14)

## 2013-10-06 MED ORDER — ENOXAPARIN SODIUM 80 MG/0.8ML ~~LOC~~ SOLN
80.0000 mg | SUBCUTANEOUS | Status: DC
  Administered 2013-10-07 – 2013-10-08 (×2): 80 mg via SUBCUTANEOUS
  Filled 2013-10-06 (×2): qty 0.8

## 2013-10-06 MED ORDER — ENOXAPARIN SODIUM 80 MG/0.8ML ~~LOC~~ SOLN
70.0000 mg | SUBCUTANEOUS | Status: DC
  Administered 2013-10-06: 70 mg via SUBCUTANEOUS
  Filled 2013-10-06: qty 0.8

## 2013-10-06 MED ORDER — METHYLPREDNISOLONE SODIUM SUCC 125 MG IJ SOLR
60.0000 mg | Freq: Once | INTRAMUSCULAR | Status: AC
  Administered 2013-10-06: 60 mg via INTRAVENOUS
  Filled 2013-10-06: qty 2

## 2013-10-06 MED ORDER — SODIUM CHLORIDE 0.9 % IV BOLUS (SEPSIS)
500.0000 mL | Freq: Once | INTRAVENOUS | Status: AC
  Administered 2013-10-06: 500 mL via INTRAVENOUS

## 2013-10-06 MED ORDER — SODIUM CHLORIDE 0.9 % IV SOLN
INTRAVENOUS | Status: AC
  Administered 2013-10-06: 07:00:00 via INTRAVENOUS
  Administered 2013-10-06: 175 mL via INTRAVENOUS

## 2013-10-06 MED ORDER — SODIUM CHLORIDE 0.9 % IV BOLUS (SEPSIS)
1000.0000 mL | Freq: Once | INTRAVENOUS | Status: AC
Start: 2013-10-06 — End: 2013-10-06
  Administered 2013-10-06: 1000 mL via INTRAVENOUS

## 2013-10-06 MED ORDER — ONDANSETRON HCL 4 MG/2ML IJ SOLN: 4.0000 mg | Freq: Three times a day (TID) | INTRAMUSCULAR | Status: DC | PRN

## 2013-10-06 NOTE — Clinical Social Work Note (Signed)
CSW received consult for advance directives. Chaplain notified and will try to meet with pt to complete. CSW signing off, but can be reconsulted if needed.  Navy Belay, LCSW [phone] 

## 2013-10-06 NOTE — ED Provider Notes (Signed)
CSN: [id number]     Arrival date & time 10/06/13  0230 History   First MD Initiated Contact with Patient 10/06/13 0248     Chief Complaint  Patient presents with  . Shortness of Breath    all over body pain     (Consider location/radiation/quality/duration/timing/severity/associated sxs/prior Treatment) HPI Comments: And 43 year old male with tobacco abuse, lipids, hypertension, rhabdomyolysis secondary to cholesterol medication presents with diffuse body pain and aches and mild swelling sensation diffuse gradually worsening for 3 weeks. Feels similar to his rhabdomyolysis history. Patient denies any new medications, new foods or other changes. Patient is not on cholesterol medication currently. Nothing is improved his symptoms. Patient has had mild gradual onset shortness of breath for 3 weeks as well worse with exertion. Patient had a recent cardiac cath for which she was told it was normal. No known heart problems or blood clot history. No recent surgery, long travel, active cancer history, unilateral leg pain or swelling or hemoptysis. No heart failure history known.  Patient is a 43 y.o. male presenting with shortness of breath. The history is provided by the patient.  Shortness of Breath Associated symptoms: no abdominal pain, no chest pain, no fever, no headaches, no neck pain, no rash and no vomiting     Past Medical History  Diagnosis Date  . Hypertension   . Hyperlipidemia   . GERD (gastroesophageal reflux disease)   . Hypothyroidism   . History of rhabdomyolysis   . History of kidney stones   . Vitamin D deficiency   . Chest pain   . Tobacco abuse   . Pericardial effusion     Small, by dobutamine echocardiogram, 04/2006  . Asthma   . Kidney calculus 2014   Past Surgical History  Procedure Laterality Date  . Varicocelectomy    . Percutaneous nephrolithotripsy    . Hernia repair      As a child  . Kidney stone surgery    . Umbilical hernia repair N/A 11/13/2012   Procedure: UMBILICAL HERNIORRHAPHY;  Surgeon: Dalia HeadingMark A Jenkins, MD;  Location: [facility];  Service: General;  Laterality: N/A;  . Insertion of mesh N/A 11/13/2012    Procedure: INSERTION OF MESH;  Surgeon: Dalia HeadingMark A Jenkins, MD;  Location: [facility];  Service: General;  Laterality: N/A;  . Cholecystectomy N/A 01/27/2013    Procedure: LAPAROSCOPIC CHOLECYSTECTOMY;  Surgeon: Dalia HeadingMark A Jenkins, MD;  Location: [facility];  Service: General;  Laterality: N/A;   Family History  Problem Relation Age of Onset  . Coronary artery disease Father   . Coronary artery disease Mother   . Coronary artery disease Maternal Grandfather     both grandfathers and several uncles  . Coronary artery disease    . Hypertension Father   . Hyperlipidemia Father   . Diabetes Father   . Diabetes Mother   . Diabetes Sister     both sisters  . Hypertension Mother   . Hyperlipidemia Mother    History  Substance Use Topics  . Smoking status: Current Every Day Smoker -- 0.50 packs/day for 26 years    Types: Cigarettes    Start date: 03/13/1985  . Smokeless tobacco: Former NeurosurgeonUser    Types: Snuff, Dorna BloomChew    Quit date: 03/14/1987     Comment: started smoking at age 43. chewed and dipped for 2 years  . Alcohol Use: No    Review of Systems  Constitutional: Negative for fever and chills.  HENT: Negative for congestion.   Eyes: Negative  for visual disturbance.  Respiratory: Positive for shortness of breath.   Cardiovascular: Negative for chest pain.  Gastrointestinal: Negative for vomiting and abdominal pain.  Genitourinary: Negative for dysuria and flank pain.  Musculoskeletal: Positive for arthralgias. Negative for back pain, neck pain and neck stiffness.  Skin: Negative for rash.  Neurological: Negative for light-headedness and headaches.      Allergies  Bee venom and Crestor  Home Medications   Prior to Admission medications   Medication Sig Start Date End Date Taking? Authorizing Provider  levothyroxine (SYNTHROID,  LEVOTHROID) 200 MCG tablet Take 200 mcg by mouth daily before breakfast.   Yes Historical Provider, MD  nitroGLYCERIN (NITROSTAT) 0.4 MG SL tablet Place 0.4 mg under the tongue every 5 (five) minutes as needed for chest pain.   Yes Historical Provider, MD  ranitidine (ZANTAC) 150 MG tablet Take 150-300 mg by mouth daily.   Yes Historical Provider, MD  oxyCODONE-acetaminophen (PERCOCET) 7.5-325 MG per tablet Take 1-2 tablets by mouth every 4 (four) hours as needed. 01/27/13   Dalia Heading, MD   BP 154/112  Pulse 82  Temp(Src) 98.9 F (37.2 C) (Oral)  Resp 18  Ht 6\' 6"  (1.981 m)  Wt 320 lb (145.151 kg)  BMI 36.99 kg/m2  SpO2 93% Physical Exam  Nursing note and vitals reviewed. Constitutional: He is oriented to person, place, and time. He appears well-developed and well-nourished.  HENT:  Head: Normocephalic and atraumatic.  Eyes: Conjunctivae are normal. Right eye exhibits no discharge. Left eye exhibits no discharge.  Neck: Normal range of motion. Neck supple. No tracheal deviation present.  Cardiovascular: Normal rate, regular rhythm and intact distal pulses.   No murmur heard. Pulmonary/Chest: Effort normal and breath sounds normal.  Abdominal: Soft. He exhibits no distension. There is no tenderness. There is no guarding.  Musculoskeletal: He exhibits no edema.  Neurological: He is alert and oriented to person, place, and time.  Skin: Skin is warm. No rash noted.  Psychiatric: He has a normal mood and affect.    ED Course  Procedures (including critical care time) Labs Review Labs Reviewed  BASIC METABOLIC PANEL - Abnormal; Notable for the following:    Glucose, Bld 113 (*)    BUN 25 (*)    Creatinine, Ser 1.77 (*)    GFR calc non Af Amer 46 (*)    GFR calc Af Amer 53 (*)    Anion gap 16 (*)    All other components within normal limits  CBC WITH DIFFERENTIAL - Abnormal; Notable for the following:    WBC 11.6 (*)    RDW 16.6 (*)    Lymphs Abs 4.3 (*)    All other  components within normal limits  CK - Abnormal; Notable for the following:    Total CK 9196 (*)    All other components within normal limits  URINALYSIS, ROUTINE W REFLEX MICROSCOPIC - Abnormal; Notable for the following:    Hgb urine dipstick MODERATE (*)    Protein, ur 30 (*)    All other components within normal limits  TROPONIN I  PRO B NATRIURETIC PEPTIDE  URINE MICROSCOPIC-ADD ON  TSH  URINE RAPID DRUG SCREEN (HOSP PERFORMED)  I-STAT CG4 LACTIC ACID, ED    Imaging Review Dg Chest 2 View  10/06/2013   CLINICAL DATA:  Shortness of breath and generalized body swelling for 3 weeks. Body aches.  EXAM: CHEST  2 VIEW  COMPARISON:  01/25/2013  FINDINGS: The heart size and mediastinal contours are  within normal limits. Both lungs are clear. The visualized skeletal structures are unremarkable.  IMPRESSION: No active cardiopulmonary disease.   Electronically Signed   By: Burman Nieves M.D.   On: 10/06/2013 03:39     EKG Interpretation   Date/Time:  Monday October 06 2013 03:05:35 EDT Ventricular Rate:  78 PR Interval:  156 QRS Duration: 108 QT Interval:  383 QTC Calculation: 436 R Axis:   87 Text Interpretation:  Sinus rhythm Borderline T abnormalities,lateral and   inferior leads Confirmed by Keeva Reisen  MD, Kainan Patty ([id number]) on 10/06/2013 3:08:29  AM      MDM   Final diagnoses:  Non-traumatic rhabdomyolysis  Dehydration  Elevated CK  Acute renal failure, unspecified acute renal failure type   Patient with history of rhabdo presents of similar symptoms despite no new medications. Plan for blood work, CK, troponin, cardiac eval for shortness of breath and IV fluids and no signs of heart failure.  CK elevated with ARF, discussed with Dr. Onalee Hua who agrees with general medicine admission for IV fluids and further assessment.  The patients results and plan were reviewed and discussed.   Any x-rays performed were personally reviewed by myself.   Differential diagnosis were  considered with the presenting HPI.  Medications  ondansetron (ZOFRAN) injection 4 mg (not administered)  sodium chloride 0.9 % bolus 500 mL (0 mLs Intravenous Stopped 10/06/13 0359)  sodium chloride 0.9 % bolus 1,000 mL (1,000 mLs Intravenous New Bag/Given 10/06/13 0501)     Filed Vitals:   10/06/13 0239 10/06/13 0400 10/06/13 0430 10/06/13 0501  BP: 154/112 133/85 133/86 133/97  Pulse: 82 72 78 71  Temp: 98.9 F (37.2 C)   98.1 F (36.7 C)  TempSrc: Oral   Oral  Resp: 18 14 11 18   Height: 6\' 6"  (1.981 m)     Weight: 320 lb (145.151 kg)     SpO2: 93% 92% 93% 94%    Admission/ observation were discussed with the admitting physician, patient and/or family and they are comfortable with the plan.      Enid Skeens, MD 10/06/13 0530

## 2013-10-06 NOTE — ED Notes (Signed)
Patient states symptoms started about three weeks ago. Patient states he has "all over body pain" "shortness of breath" and generalized body swelling. Patient is A&OX4

## 2013-10-06 NOTE — Progress Notes (Signed)
Gave patient Advance Directives, discussed briefly. He stated he didn't feel well enough to complete at this time but was interested in the information. Plan to check with him about whether he will be able to complete while hospitalized.

## 2013-10-06 NOTE — H&P (Signed)
PCP:   Ernestine ConradBLUTH, KIRK, MD   Chief Complaint:  Body aches  HPI: 43 yo male h/o htn, hld, rhabdomyolysis several years ago thought to be due to statin comes in with 3 week of muscle aches, myalgia and diffuse joint pain.  No fevers.  No rashes.  No changes in medications.  No otc meds /herbal meds new.  He had an episode over 5 years ago that required hospitalization and was thought to be due to statin that was recently started.  He has a maternal grandfather that was on dialysis by age of 42 for unknown reason.  Pt does not know of any muscle, or rheumatological disorders in his family.  He has noticed his urine is darker.  Denies any abd pain.  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  Review of Systems:  Positive and negative as per HPI otherwise all other systems are negative  Past Medical History: Past Medical History  Diagnosis Date  . Hypertension   . Hyperlipidemia   . GERD (gastroesophageal reflux disease)   . Hypothyroidism   . History of rhabdomyolysis   . History of kidney stones   . Vitamin D deficiency   . Chest pain   . Tobacco abuse   . Pericardial effusion     Small, by dobutamine echocardiogram, 04/2006  . Asthma   . Kidney calculus 2014   Past Surgical History  Procedure Laterality Date  . Varicocelectomy    . Percutaneous nephrolithotripsy    . Hernia repair      As a child  . Kidney stone surgery    . Umbilical hernia repair N/A 11/13/2012    Procedure: UMBILICAL HERNIORRHAPHY;  Surgeon: Dalia HeadingMark A Jenkins, MD;  Location: [facility];  Service: General;  Laterality: N/A;  . Insertion of mesh N/A 11/13/2012    Procedure: INSERTION OF MESH;  Surgeon: Dalia HeadingMark A Jenkins, MD;  Location: [facility];  Service: General;  Laterality: N/A;  . Cholecystectomy N/A 01/27/2013    Procedure: LAPAROSCOPIC CHOLECYSTECTOMY;  Surgeon: Dalia HeadingMark A Jenkins, MD;  Location: [facility];  Service: General;  Laterality: N/A;    Medications: Prior to Admission medications    Medication Sig Start Date End Date Taking? Authorizing Provider  levothyroxine (SYNTHROID, LEVOTHROID) 200 MCG tablet Take 200 mcg by mouth daily before breakfast.   Yes Historical Provider, MD  nitroGLYCERIN (NITROSTAT) 0.4 MG SL tablet Place 0.4 mg under the tongue every 5 (five) minutes as needed for chest pain.   Yes Historical Provider, MD  ranitidine (ZANTAC) 150 MG tablet Take 150-300 mg by mouth daily.   Yes Historical Provider, MD  oxyCODONE-acetaminophen (PERCOCET) 7.5-325 MG per tablet Take 1-2 tablets by mouth every 4 (four) hours as needed. 01/27/13   Dalia HeadingMark A Jenkins, MD    Allergies:   Allergies  Allergen Reactions  . Bee Venom Swelling  . Crestor [Rosuvastatin] Other (See Comments)    rhabdomyolosis    Social History:  reports that he has been smoking Cigarettes.  He started smoking about 28 years ago. He has a 13 pack-year smoking history. He quit smokeless tobacco use about 26 years ago. His smokeless tobacco use included Snuff and Chew. He reports that he does not drink alcohol or use illicit drugs.  Family History: Family History  Problem Relation Age of Onset  . Coronary artery disease Father   . Coronary artery disease Mother   . Coronary artery disease Maternal Grandfather     both grandfathers  and several uncles  . Coronary artery disease    . Hypertension Father   . Hyperlipidemia Father   . Diabetes Father   . Diabetes Mother   . Diabetes Sister     both sisters  . Hypertension Mother   . Hyperlipidemia Mother   maternal grandfather was on dialysis lates 42s for unknown reason  Physical Exam: Filed Vitals:   10/06/13 0430 10/06/13 0500 10/06/13 0501 10/06/13 0530  BP: 133/86 133/97 133/97 141/84  Pulse: 78 79 71 73  Temp:   98.1 F (36.7 C)   TempSrc:   Oral   Resp: 11 18 18 25   Height:      Weight:      SpO2: 93% 91% 94% 91%   General appearance: alert, cooperative and no distress Head: Normocephalic, without obvious abnormality,  atraumatic Eyes: negative Nose: Nares normal. Septum midline. Mucosa normal. No drainage or sinus tenderness. Neck: no JVD and supple, symmetrical, trachea midline Lungs: clear to auscultation bilaterally Heart: regular rate and rhythm, S1, S2 normal, no murmur, click, rub or gallop Abdomen: soft, non-tender; bowel sounds normal; no masses,  no organomegaly Extremities: extremities normal, atraumatic, no cyanosis or edema Pulses: 2+ and symmetric Skin: Skin color, texture, turgor normal. No rashes or lesions Neurologic: Grossly normal   Labs on Admission:   Recent Labs  10/06/13 0250  NA 138  K 4.2  CL 96  CO2 26  GLUCOSE 113*  BUN 25*  CREATININE 1.77*  CALCIUM 10.5    Recent Labs  10/06/13 0250  WBC 11.6*  NEUTROABS 6.5  HGB 14.8  HCT 43.1  MCV 92.1  PLT 201    Recent Labs  10/06/13 0250  CKTOTAL 9196*  TROPONINI <0.30    Radiological Exams on Admission: Dg Chest 2 View  10/06/2013   CLINICAL DATA:  Shortness of breath and generalized body swelling for 3 weeks. Body aches.  EXAM: CHEST  2 VIEW  COMPARISON:  01/25/2013  FINDINGS: The heart size and mediastinal contours are within normal limits. Both lungs are clear. The visualized skeletal structures are unremarkable.  IMPRESSION: No active cardiopulmonary disease.   Electronically Signed   By: Burman Nieves M.D.   On: 10/06/2013 03:39    Assessment/Plan  43 yo male with second episode of rhabdomyolysis with renal failure  Principal Problem:   Rhabdomyolysis-  Story is concerning for underlying rheumatological disorder.  Place on ivf and serial his cpk.  Will need full rheum eval at some point, will order a few basic rheum blood tests now.  Follow cr closely.  Active Problems:  Stable unless o/w noted   Hypertension   Hyperlipidemia   History of rhabdomyolysis   Acute renal failure see above  Full code.  Admit to med surg.  Alvera Tourigny A 10/06/2013, 5:39 AM

## 2013-10-06 NOTE — Progress Notes (Addendum)
The patient is a 43 year old man with a history of rhabdomyolysis, hypertension, hyperlipidemia, and GERD. He was admitted this morning by Dr. Onalee Huaavid for body aches/myalgias. He was found to have a total CK of 9196. The patient was briefly seen and examined. He was discussed with nurse practitioner, Ms. Vedia CofferBlack. Agree with her assessment and plan with exceptions below. In my conversation with the patient, he denies strenuous muscle activity, any recent falls, or working out in the heat. I am concerned that he may have an underlying myositis, query dermatomyositis or polymyositis rather than rhabdomyolysis. He denies any unusual rash. Will order ANA, rheumatoid factor, sedimentation rate, TSH, and serum myoglobin. We'll start steroids empirically. Agree that the patient will need an rheumatology consultation in the outpatient setting. We'll continue IV fluids and supportive treatment, but will increase the rate of IV fluids to 175 cc an hour. He has acute renal failure and his renal function will need to be followed closely. The patient was advised to stop smoking. We'll order tobacco cessation counseling. He declines a nicotine patch.

## 2013-10-06 NOTE — Progress Notes (Signed)
Chart reviewed and patient examined.  Objective VSS afebrile non-toxic appearing.   Sedimentation rate within the limits of normal Rheumatoid factor, ANA and urine myoglobin and serum myoglobin in process   PE Gen: obese comfortable appearing CV: RRR No MGR No LE edema Resp: normal effort BS clear bilaterally no wheeze Abd: soft +BS non-tender to palpation   Rhabdomyolysis- no recent falls. No extended physical work outdoors. Does fish routinely at night. Story is concerning for underlying rheumatological disorder. Continue ivf and  cpk. Steroids.  Will need OP Rheumatoid consult.    Acute renal failure: related to #1. Improving this am with IV fluids. monitor Active Problems:  Hypertension: controlled. Not on anti-hypertensive medications  Hyperlipidemia: not on statin.   History of rhabdomyolysis: attributed to statin in past. Currently not on statin  Leukocytosis: likely related to above. He is afebrile and non-toxic appearing.   Toya SmothersKaren Wael Maestas NP

## 2013-10-07 ENCOUNTER — Encounter (HOSPITAL_COMMUNITY): Payer: Self-pay | Admitting: Internal Medicine

## 2013-10-07 DIAGNOSIS — E1159 Type 2 diabetes mellitus with other circulatory complications: Secondary | ICD-10-CM | POA: Diagnosis present

## 2013-10-07 DIAGNOSIS — E871 Hypo-osmolality and hyponatremia: Secondary | ICD-10-CM | POA: Diagnosis not present

## 2013-10-07 DIAGNOSIS — E039 Hypothyroidism, unspecified: Secondary | ICD-10-CM | POA: Diagnosis present

## 2013-10-07 DIAGNOSIS — E119 Type 2 diabetes mellitus without complications: Secondary | ICD-10-CM

## 2013-10-07 HISTORY — DX: Type 2 diabetes mellitus without complications: E11.9

## 2013-10-07 LAB — COMPREHENSIVE METABOLIC PANEL
ALK PHOS: 78 U/L (ref 39–117)
ALT: 62 U/L — AB (ref 0–53)
AST: 80 U/L — AB (ref 0–37)
Albumin: 4.4 g/dL (ref 3.5–5.2)
Anion gap: 13 (ref 5–15)
BILIRUBIN TOTAL: 0.4 mg/dL (ref 0.3–1.2)
BUN: 20 mg/dL (ref 6–23)
CHLORIDE: 96 meq/L (ref 96–112)
CO2: 27 meq/L (ref 19–32)
Calcium: 10.1 mg/dL (ref 8.4–10.5)
Creatinine, Ser: 1.49 mg/dL — ABNORMAL HIGH (ref 0.50–1.35)
GFR calc Af Amer: 66 mL/min — ABNORMAL LOW (ref >90)
GFR calc non Af Amer: 57 mL/min — ABNORMAL LOW (ref >90)
Glucose, Bld: 164 mg/dL — ABNORMAL HIGH (ref 70–99)
Potassium: 4.8 mEq/L (ref 3.7–5.3)
Sodium: 136 mEq/L — ABNORMAL LOW (ref 137–147)
Total Protein: 8.3 g/dL (ref 6.0–8.3)

## 2013-10-07 LAB — CBC
HCT: 45.6 % (ref 39.0–52.0)
HEMOGLOBIN: 15.4 g/dL (ref 13.0–17.0)
MCH: 31.1 pg (ref 26.0–34.0)
MCHC: 33.8 g/dL (ref 30.0–36.0)
MCV: 92.1 fL (ref 78.0–100.0)
Platelets: 203 10*3/uL (ref 150–400)
RBC: 4.95 MIL/uL (ref 4.22–5.81)
RDW: 16.6 % — ABNORMAL HIGH (ref 11.5–15.5)
WBC: 13.5 10*3/uL — ABNORMAL HIGH (ref 4.0–10.5)

## 2013-10-07 LAB — GLUCOSE, CAPILLARY
GLUCOSE-CAPILLARY: 266 mg/dL — AB (ref 70–99)
Glucose-Capillary: 203 mg/dL — ABNORMAL HIGH (ref 70–99)

## 2013-10-07 LAB — ANA: ANA: NEGATIVE

## 2013-10-07 LAB — CK: CK TOTAL: 6143 U/L — AB (ref 7–232)

## 2013-10-07 MED ORDER — PREDNISONE 10 MG PO TABS
10.0000 mg | ORAL_TABLET | Freq: Every day | ORAL | Status: DC
  Administered 2013-10-08: 10 mg via ORAL
  Filled 2013-10-07: qty 1

## 2013-10-07 MED ORDER — INSULIN ASPART 100 UNIT/ML ~~LOC~~ SOLN
0.0000 [IU] | Freq: Every day | SUBCUTANEOUS | Status: DC
  Administered 2013-10-07: 3 [IU] via SUBCUTANEOUS

## 2013-10-07 MED ORDER — PREDNISONE 20 MG PO TABS
60.0000 mg | ORAL_TABLET | Freq: Every day | ORAL | Status: DC
  Administered 2013-10-07: 60 mg via ORAL
  Filled 2013-10-07: qty 6

## 2013-10-07 MED ORDER — SODIUM CHLORIDE 0.9 % IV SOLN
INTRAVENOUS | Status: DC
  Administered 2013-10-07 – 2013-10-08 (×4): via INTRAVENOUS

## 2013-10-07 MED ORDER — LEVOTHYROXINE SODIUM 100 MCG PO TABS
200.0000 ug | ORAL_TABLET | Freq: Every day | ORAL | Status: DC
  Administered 2013-10-07 – 2013-10-08 (×2): 200 ug via ORAL
  Filled 2013-10-07 (×2): qty 2

## 2013-10-07 MED ORDER — LEVOTHYROXINE SODIUM 100 MCG IV SOLR
50.0000 ug | Freq: Once | INTRAVENOUS | Status: AC
  Administered 2013-10-07: 50 ug via INTRAVENOUS
  Filled 2013-10-07: qty 5

## 2013-10-07 MED ORDER — INSULIN ASPART 100 UNIT/ML ~~LOC~~ SOLN
0.0000 [IU] | Freq: Three times a day (TID) | SUBCUTANEOUS | Status: DC
  Administered 2013-10-07: 3 [IU] via SUBCUTANEOUS
  Administered 2013-10-08 (×3): 1 [IU] via SUBCUTANEOUS

## 2013-10-07 NOTE — Progress Notes (Signed)
The patient was seen and examined. His chart, vitals signs, and laboratory studies were reviewed. He was discussed with nurse practitioner, Ms. Vedia CofferBlack. Agree with her assessment and plan. The patient is a 43 year old man with a history of statin-induced rhabdomyolysis, hypertension, and hypothyroidism, who was admitted for generalized myalgias. His total CK was greater than 9000 on admission. He was started on treatment with aggressive IV fluids for presumed rhabdomyolysis. However, myositis was also considered although he had no prior diagnosis of either polymyositis or dermatomyositis. Steroids were started empirically. A few rheumatological labs were ordered and have been negative so far including a negative rheumatoid factor, negative sed rate, and negative ANA. Urine myoglobin is pending. However, the patient's TSH was found to be greater than 100 and his free T4 was low. Per history gathered, the patient may have been taking only 25 mcg of Synthroid rather than 225 mg. See Ms. Black's note. His myositis or elevated total CK may be the consequence of profound hypothyroidism. The dose has been increased to 225 mcg daily. We will continue IV fluid hydration and monitoring his total CK. He may be discharged in the next day or 2.  Of note, his blood glucose is elevated, which was initially thought to be secondary to steroid therapy. However, his hemoglobin A1c is 7.0. We'll start sliding scale NovoLog and change his diet to a carbohydrate modified. We'll consult the diabetes coordinator. We'll consider starting metformin tomorrow if his renal function improves. We'll taper down prednisone to 10 mg tomorrow morning.

## 2013-10-07 NOTE — Progress Notes (Signed)
[facility] PROGRESS NOTE  Annell Greeningarl L Lightcap Jr. ZOX:[id number] DOB: Oct 24, 1970 DOA: 10/06/2013 PCP: Ernestine ConradBLUTH, KIRK, MD  Assessment/Plan: Rhabdomyolysis- no recent falls. No extended physical work outdoors. CK trending downward. Dr. Sherrie MustacheFisher concern for underlying myositis or polymyositis vs rhabdomyolysis. Sed rate and rheumatoid factor within the limits of normal. Await urine myoglobin and serum myoglobin in process.  Continue ivf and cpk. Continue steroids. Will need OP Rheumatoid consult.   Hypothyroidism: hx of same. TSH >100. Patient reports taking home meds as ordered. Discussed with  PCP who reports last visit 03/14/12 at which time TSH was 8.9 and synthroid dose increased. Spoke with pharmacy [facility] and no refil of synthroid since 11/08/12. Compliance may be issue.  He reports feeling very fatigued of late. Denies constipation, sensitivity to temperature, unusual hair loss, difficulty sleeping.  Free T4 0.24.  Provided with IV synthroid and home dose resumed. Have requested endocrinology consultation.   Acute renal failure: related to #1. Creatinine continues to trend downward. Urine output good.  Will continue IV fluids. monitor   Active Problems:  Hypertension: fair control. Diastolic range 87-90. Will monitor.   Hyperlipidemia: not on statin.   History of rhabdomyolysis: attributed to statin in past. Currently not on statin   Leukocytosis: white count trending up in setting of #1 and steroids. He he remains afebrile and non-toxic appearing.    Code Status: full Family Communication: none present Disposition Plan: home hopefully tomorrow   Consultants:  Dr Fransico HimNida endocrinology  Procedures:  none  Antibiotics:  none  HPI/Subjective: Awake alert. Reports "achiness" better.   Objective: Filed Vitals:   10/07/13 0858  BP:   Pulse: 60  Temp: 97.8 F (36.6 C)  Resp:     Intake/Output Summary (Last 24 hours) at 10/07/13 0957 Last data filed at 10/06/13 2129  Gross  per 24 hour  Intake    480 ml  Output   1600 ml  Net  -1120 ml   Filed Weights   10/06/13 0239 10/06/13 0607 10/07/13 0502  Weight: 145.151 kg (320 lb) 154.949 kg (341 lb 9.6 oz) 150.821 kg (332 lb 8 oz)    Exam:   General:  Obese appears comfortable  Cardiovascular: RRR No MGR No LE edema  Respiratory: normal effort BS clear bilaterally. No wheeze no rhonchi  Abdomen: soft +BS non-tender to palpation  Musculoskeletal: joints without swelling/erythema   Data Reviewed: Basic Metabolic Panel:  Recent Labs Lab 10/06/13 0250 10/06/13 0644 10/07/13 0604  NA 138  --  136*  K 4.2  --  4.8  CL 96  --  96  CO2 26  --  27  GLUCOSE 113*  --  164*  BUN 25*  --  20  CREATININE 1.77* 1.63* 1.49*  CALCIUM 10.5  --  10.1   Liver Function Tests:  Recent Labs Lab 10/06/13 0644 10/07/13 0604  AST 112* 80*  ALT 72* 62*  ALKPHOS 85 78  BILITOT 0.3 0.4  PROT 8.3 8.3  ALBUMIN 4.7 4.4   No results found for this basename: LIPASE, AMYLASE,  in the last 168 hours No results found for this basename: AMMONIA,  in the last 168 hours CBC:  Recent Labs Lab 10/06/13 0250 10/06/13 0644 10/07/13 0604  WBC 11.6* 12.2* 13.5*  NEUTROABS 6.5  --   --   HGB 14.8 14.8 15.4  HCT 43.1 43.7 45.6  MCV 92.1 92.0 92.1  PLT 201 200 203   Cardiac Enzymes:  Recent Labs Lab 10/06/13 0250 10/06/13 0644 10/07/13  0604  CKTOTAL 9196* 9831* 6143*  TROPONINI <0.30  --   --    BNP (last 3 results)  Recent Labs  10/06/13 0250  PROBNP 13.2   CBG: No results found for this basename: GLUCAP,  in the last 168 hours  No results found for this or any previous visit (from the past 240 hour(s)).   Studies: Dg Chest 2 View  10/06/2013   CLINICAL DATA:  Shortness of breath and generalized body swelling for 3 weeks. Body aches.  EXAM: CHEST  2 VIEW  COMPARISON:  01/25/2013  FINDINGS: The heart size and mediastinal contours are within normal limits. Both lungs are clear. The visualized  skeletal structures are unremarkable.  IMPRESSION: No active cardiopulmonary disease.   Electronically Signed   By: Burman Nieves M.D.   On: 10/06/2013 03:39    Scheduled Meds: . enoxaparin (LOVENOX) injection  80 mg Subcutaneous Q24H  . levothyroxine  50 mcg Intravenous Once  . levothyroxine  200 mcg Oral QAC breakfast  . predniSONE  60 mg Oral Q breakfast   Continuous Infusions: . sodium chloride      Principal Problem:   Myalgia and myositis, unspecified Active Problems:   Tobacco abuse   Hypertension   Hyperlipidemia   Rhabdomyolysis   Acute renal failure   Elevated AST (SGOT)   Elevated ALT measurement   Hypothyroidism   Hyponatremia    Time spent: 35 mintues    [facility] M  [facility] Pager [phone]. If 7PM-7AM, please contact night-coverage at [email or web address], password [email or web address] 10/07/2013, 9:57 AM  LOS: 1 day

## 2013-10-08 DIAGNOSIS — IMO0001 Reserved for inherently not codable concepts without codable children: Secondary | ICD-10-CM

## 2013-10-08 LAB — CBC
HEMATOCRIT: 43.2 % (ref 39.0–52.0)
Hemoglobin: 14.4 g/dL (ref 13.0–17.0)
MCH: 30.5 pg (ref 26.0–34.0)
MCHC: 33.3 g/dL (ref 30.0–36.0)
MCV: 91.5 fL (ref 78.0–100.0)
Platelets: 198 10*3/uL (ref 150–400)
RBC: 4.72 MIL/uL (ref 4.22–5.81)
RDW: 16.5 % — ABNORMAL HIGH (ref 11.5–15.5)
WBC: 13.8 10*3/uL — AB (ref 4.0–10.5)

## 2013-10-08 LAB — BASIC METABOLIC PANEL
Anion gap: 14 (ref 5–15)
BUN: 16 mg/dL (ref 6–23)
CALCIUM: 9.5 mg/dL (ref 8.4–10.5)
CO2: 25 meq/L (ref 19–32)
CREATININE: 1.25 mg/dL (ref 0.50–1.35)
Chloride: 100 mEq/L (ref 96–112)
GFR calc Af Amer: 81 mL/min — ABNORMAL LOW (ref >90)
GFR calc non Af Amer: 70 mL/min — ABNORMAL LOW (ref >90)
GLUCOSE: 126 mg/dL — AB (ref 70–99)
Potassium: 4.2 mEq/L (ref 3.7–5.3)
Sodium: 139 mEq/L (ref 137–147)

## 2013-10-08 LAB — GLUCOSE, CAPILLARY
GLUCOSE-CAPILLARY: 131 mg/dL — AB (ref 70–99)
Glucose-Capillary: 128 mg/dL — ABNORMAL HIGH (ref 70–99)
Glucose-Capillary: 122 mg/dL — ABNORMAL HIGH (ref 70–99)

## 2013-10-08 LAB — CK: Total CK: 3957 U/L — ABNORMAL HIGH (ref 7–232)

## 2013-10-08 MED ORDER — LIVING WELL WITH DIABETES BOOK
Freq: Once | Status: AC
  Administered 2013-10-08: 11:00:00
  Filled 2013-10-08: qty 1

## 2013-10-08 MED ORDER — BLOOD GLUCOSE MONITORING SUPPL W/DEVICE KIT: PACK | Status: DC

## 2013-10-08 MED ORDER — LEVOTHYROXINE SODIUM 75 MCG PO TABS: 225.0000 ug | ORAL_TABLET | Freq: Every day | ORAL | Status: DC

## 2013-10-08 MED ORDER — METFORMIN HCL 500 MG PO TABS: 500.0000 mg | ORAL_TABLET | Freq: Every day | ORAL | Status: DC

## 2013-10-08 MED ORDER — LISINOPRIL 5 MG PO TABS
2.5000 mg | ORAL_TABLET | Freq: Every day | ORAL | Status: DC
  Administered 2013-10-08: 2.5 mg via ORAL
  Filled 2013-10-08: qty 1

## 2013-10-08 MED ORDER — LISINOPRIL 2.5 MG PO TABS
2.5000 mg | ORAL_TABLET | Freq: Every day | ORAL | Status: DC
Start: 2013-10-08 — End: 2017-04-29

## 2013-10-08 NOTE — Discharge Summary (Signed)
Physician Discharge Summary  Devin Greeningarl L Belay Jr. RUE:[id number]:[id number] DOB: 01/07/1971 DOA: 10/06/2013  PCP: Ernestine ConradBLUTH, KIRK, MD  Admit date: 10/06/2013 Discharge date: 10/08/2013  Time spent: 40 minutes  Recommendations for Outpatient Follow-up:  1. [facility] for tracking of TSH and glycemic control as well as blood pressure 2. Home health RN to assist with evaluation of medication administration  Discharge Diagnoses:  Principal Problem:   Myalgia and myositis, unspecified Active Problems:   Tobacco abuse   Hypertension   Hyperlipidemia   Rhabdomyolysis   Acute renal failure   Elevated AST (SGOT)   Elevated ALT measurement   Hypothyroidism   Hyponatremia   Type 2 diabetes mellitus without complications   Discharge Condition: stable  Diet recommendation: carb modified  Filed Weights   10/06/13 0607 10/07/13 0502 10/08/13 0630  Weight: 154.949 kg (341 lb 9.6 oz) 150.821 kg (332 lb 8 oz) 156.31 kg (344 lb 9.6 oz)    History of present illness:  43 yo male h/o htn, hld, rhabdomyolysis several years ago thought to be due to statin came to ED on 10/06/13 with 3 week hx of muscle aches, myalgia and diffuse joint pain. No fevers. No rashes. No changes in medications. No otc meds /herbal meds new. He had an episode over 5 years ago that required hospitalization and was thought to be due to statin that was recently started. He has a maternal grandfather that was on dialysis by age of 42 for unknown reason. Pt did not know of any muscle, or rheumatological disorders in his family. He had noticed his urine was darker. Denied any abd pain. He was currently laid off and not employed .   Hospital Course:  Rhabdomyolysis-  no recent falls. No extended physical work outdoors. Likely as a result of TSH >100 due to medical non-compliance. CK on admission >9000. Some concern for underlying myositis or polymyositis vs rhabdomyolysis. Sed rate and rheumatoid factor  within the limits of normal. Urine myoglobin and serum myoglobin in process. Provided with aggressive IV fluids and steroids. Quickly improved. CK at discharge 3000. Has appointment for follow up lab work 10/29/13   Hypothyroidism: hx of same. TSH >100 free T4 low. Related to medicine non-compliance. Synthroid resumed at last prescribed dose by PCP.  Has follow up appointment  10/29/13.  Acute renal failure: related to #1. Related to #1. Resolved at discharge.  Active Problems:  Hypertension: fair control. Started low dose lisinopril  Hyperlipidemia: not on statin due to hx rhabdo.   History of rhabdomyolysis: attributed to statin in past. Currently not on statin   Leukocytosis: white count stable at discharge. Likely related to #1 and steroids. He remained afebrile and non-toxic appearing.   Hyperglycemia: likely related to steroids but A1c 7.o. Will discontinue steroids at discharge. Educated to carb modified diet. Recommend close follow up with PCP for optimal glycemic control   Procedures:  none  Consultations:  none  Discharge Exam: Filed Vitals:   10/08/13 0630  BP: 149/90  Pulse: 43  Temp: 97.8 F (36.6 C)  Resp: 20    General: obese, appears comfortable  Cardiovascular: RRR No MGR No LE edema Respiratory: normal effort BS clear bilaterally no wheeze   Discharge Instructions You were cared for by a hospitalist during your hospital stay. If you have any questions about your discharge medications or the care you received while you were in the hospital after you are discharged, you can call the unit and asked to speak with  the hospitalist on call if the hospitalist that took care of you is not available. Once you are discharged, your primary care physician will handle any further medical issues. Please note that NO REFILLS for any discharge medications will be authorized once you are discharged, as it is imperative that you return to your primary care physician (or  establish a relationship with a primary care physician if you do not have one) for your aftercare needs so that they can reassess your need for medications and monitor your lab values.     Medication List         levothyroxine 75 MCG tablet  Commonly known as:  SYNTHROID, LEVOTHROID  Take 3 tablets (225 mcg total) by mouth daily before breakfast.  Start taking on:  10/09/2013     lisinopril 2.5 MG tablet  Commonly known as:  PRINIVIL,ZESTRIL  Take 1 tablet (2.5 mg total) by mouth daily.     nitroGLYCERIN 0.4 MG SL tablet  Commonly known as:  NITROSTAT  Place 0.4 mg under the tongue every 5 (five) minutes as needed for chest pain.     ranitidine 150 MG tablet  Commonly known as:  ZANTAC  Take 150-300 mg by mouth daily.       Allergies  Allergen Reactions  . Bee Venom Swelling  . Crestor [Rosuvastatin] Other (See Comments)    rhabdomyolosis   Follow-up Information   Follow up with Ernestine Conrad, MD.   Specialty:  Family Medicine   Contact information:   [address] [place] [address] [phone]       Follow up with [facility] On 10/29/2013. (10/29/13 at 8:30 am, then 11/21/13 at 10:30 am for lab work and follow-up. needs TSH monitored and followed)    Specialty:  Occupational Therapy   Contact information:   [address] [place] [address] [phone]        The results of significant diagnostics from this hospitalization (including imaging, microbiology, ancillary and laboratory) are listed below for reference.    Significant Diagnostic Studies: Dg Chest 2 View  10/06/2013   CLINICAL DATA:  Shortness of breath and generalized body swelling for 3 weeks. Body aches.  EXAM: CHEST  2 VIEW  COMPARISON:  01/25/2013  FINDINGS: The heart size and mediastinal contours are within normal limits. Both lungs are clear. The visualized skeletal structures are unremarkable.  IMPRESSION: No active cardiopulmonary disease.   Electronically  Signed   By: Burman Nieves M.D.   On: 10/06/2013 03:39    Microbiology: No results found for this or any previous visit (from the past 240 hour(s)).   Labs: Basic Metabolic Panel:  Recent Labs Lab 10/06/13 0250 10/06/13 0644 10/07/13 0604 10/08/13 0704  NA 138  --  136* 139  K 4.2  --  4.8 4.2  CL 96  --  96 100  CO2 26  --  27 25  GLUCOSE 113*  --  164* 126*  BUN 25*  --  20 16  CREATININE 1.77* 1.63* 1.49* 1.25  CALCIUM 10.5  --  10.1 9.5   Liver Function Tests:  Recent Labs Lab 10/06/13 0644 10/07/13 0604  AST 112* 80*  ALT 72* 62*  ALKPHOS 85 78  BILITOT 0.3 0.4  PROT 8.3 8.3  ALBUMIN 4.7 4.4   No results found for this basename: LIPASE, AMYLASE,  in the last 168 hours No results found for this basename: AMMONIA,  in the last 168  hours CBC:  Recent Labs Lab 10/06/13 0250 10/06/13 0644 10/07/13 0604 10/08/13 0704  WBC 11.6* 12.2* 13.5* 13.8*  NEUTROABS 6.5  --   --   --   HGB 14.8 14.8 15.4 14.4  HCT 43.1 43.7 45.6 43.2  MCV 92.1 92.0 92.1 91.5  PLT 201 200 203 198   Cardiac Enzymes:  Recent Labs Lab 10/06/13 0250 10/06/13 0644 10/07/13 0604 10/08/13 0704  CKTOTAL 9196* 9831* 6143* 3957*  TROPONINI <0.30  --   --   --    BNP: BNP (last 3 results)  Recent Labs  10/06/13 0250  PROBNP 13.2   CBG:  Recent Labs Lab 10/07/13 1702 10/07/13 2103 10/08/13 0743 10/08/13 1111  GLUCAP 203* 266* 131* 128*       Signed:  Toya Smothers M  [facility] 10/08/2013, 11:47 AM

## 2013-10-08 NOTE — Discharge Summary (Addendum)
Patient seen and examined.  Note reviewed.  This patient was with diffuse myalgias, generalized weakness. He was found to have rhabdomyolysis and acute renal failure. Patient was started on intravenous fluids with improvement of his serum CK levels and resolution of renal failure. The patient has known hypothyroidism and is chronically on 200 mcg of levothyroxine daily. His primary care physician increase this to 225 mcg daily. Patient likely misunderstood and was only taking 25 mcg of levothyroxine daily. His TSH was checked during this hospital stay and found to be greater than 100. Clinically, the patient has classic signs of severe hypothyroidism including thickening/dry skin, generalized weakness, mild bradycardia, weight gain. We have restarted his home dose of 225 mcg of levothyroxine. Patient has been educated on how to take his medicine. He will need a repeat TSH in 6 weeks. It is very likely that his diffuse myalgias on admission were related to a hypothyroid-induced myopathy. He as I was found to be normal range. Doubtful that this is a connective tissue disorder/polymyositis. He has been set up with the health department or primary care. He's been advised to continue with aggressive hydration while at home. His blood sugars were noted to be elevated during this hospital course. A1C was 7.0.  He has been started on metformin 500mg  daily. He is feeling significantly improved and is ready for discharge home.  MEMON,JEHANZEB

## 2013-10-08 NOTE — Care Management Utilization Note (Signed)
UR completed 

## 2013-10-08 NOTE — Care Management Note (Signed)
    Page 1 of 1   10/08/2013     12:03:59 PM CARE MANAGEMENT NOTE 10/08/2013  Patient:  Devin [name],Devin [name]   Account Number:  [id number]  Date Initiated:  10/08/2013  Documentation initiated by:  Anibal HendersonBOLDEN,Porschea Borys  Subjective/Objective Assessment:   Pt admitted with rabdomyolysis. He is from home, and is independent. He will return home at D/C.     Action/Plan:   Pt has a PCP but is unable to return there because he owes money there, and they will not see himuntil he pays acct, and he cannot afford to pay. [facility]   Anticipated DC Date:  10/08/2013   Anticipated DC Plan:  HOME/SELF CARE      DC Planning Services  CM consult  Follow-up appt scheduled  PCP issues      Choice offered to / List presented to:             Status of service:  Completed, signed off Medicare Important Message given?   (If response is "NO", the following Medicare IM given date fields will be blank) Date Medicare IM given:   Medicare IM given by:   Date Additional Medicare IM given:   Additional Medicare IM given by:    Discharge Disposition:  HOME/SELF CARE  Per UR Regulation:  Reviewed for med. necessity/level of care/duration of stay  If discussed at Long Length of Stay Meetings, dates discussed:    Comments:  10/08/13 1115 Anibal HendersonGeneva Elsy Chiang RN/CM   NP ordered HH/RN but pt has no PCP, so HH will not take patient. Notified NP that HH cannot be set up

## 2013-10-08 NOTE — Care Management Note (Deleted)
Page 1 of 2   10/08/2013     11:59:09 AM   Patient Information    Patient Name Sex DOB SSN    Devin [name], Devin L Jr. Male 1970/04/27 [id number]       H&P by Haydee Monicaachal A David, MD at 10/06/2013  5:39 AM    Author: Haydee Monicaachal A David, MD Service: Family Medicine Author Type: Physician    Filed: 10/06/2013  6:10 AM Note Time: 10/06/2013  5:39 AM Status: Signed    Editor: Haydee Monicaachal A David, MD (Physician)          PCP:   Ernestine ConradBLUTH, KIRK, MD     Chief Complaint:   Body aches   HPI: 43 yo male h/o htn, hld, rhabdomyolysis several years ago thought to be due to statin comes in with 3 week of muscle aches, myalgia and diffuse joint pain.  No fevers.  No rashes.  No changes in medications.  No otc meds /herbal meds new.  He had an episode over 5 years ago that required hospitalization and was thought to be due to statin that was recently started.  He has a maternal grandfather that was on dialysis by age of 850 for unknown reason.  Pt does not know of any muscle, or rheumatological disorders in his family.  He has noticed his urine is darker.  Denies any abd pain.  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   Review of Systems:  Positive and negative as per HPI otherwise all other systems are negative   Past Medical History: Past Medical History   Diagnosis  Date     Hypertension       Hyperlipidemia       GERD (gastroesophageal reflux disease)       Hypothyroidism       History of rhabdomyolysis       History of kidney stones       Vitamin D deficiency       Chest pain       Tobacco abuse       Pericardial effusion         Small, by dobutamine echocardiogram, 04/2006     Asthma       Kidney calculus  2014       Past Surgical History   Procedure  Laterality  Date     Varicocelectomy         Percutaneous nephrolithotripsy         Hernia repair           As a child     Kidney stone surgery         Umbilical hernia repair  N/A  11/13/2012       Procedure: UMBILICAL  HERNIORRHAPHY;  Surgeon: Dalia HeadingMark A Jenkins, MD;  Location: [facility];  Service: General;  Laterality: N/A;     Insertion of mesh  N/A  11/13/2012       Procedure: INSERTION OF MESH;  Surgeon: Dalia HeadingMark A Jenkins, MD;  Location: [facility];  Service: General;  Laterality: N/A;     Cholecystectomy  N/A  01/27/2013       Procedure: LAPAROSCOPIC CHOLECYSTECTOMY;  Surgeon: Dalia HeadingMark A Jenkins, MD;  Location: [facility];  Service: General;  Laterality: N/A;        Medications: Prior to Admission medications    Medication  Sig  Start Date  End Date  Taking?  Authorizing Provider   levothyroxine (SYNTHROID, LEVOTHROID) 200 MCG  tablet  Take 200 mcg by mouth daily before breakfast.      Yes  Historical Provider, MD   nitroGLYCERIN (NITROSTAT) 0.4 MG SL tablet  Place 0.4 mg under the tongue every 5 (five) minutes as needed for chest pain.      Yes  Historical Provider, MD   ranitidine (ZANTAC) 150 MG tablet  Take 150-300 mg by mouth daily.      Yes  Historical Provider, MD   oxyCODONE-acetaminophen (PERCOCET) 7.5-325 MG per tablet  Take 1-2 tablets by mouth every 4 (four) hours as needed.  01/27/13      Dalia Heading, MD        Allergies:    Allergies   Allergen  Reactions     Bee Venom  Swelling     Crestor [Rosuvastatin]  Other (See Comments)       rhabdomyolosis        Social History:  reports that he has been smoking Cigarettes.  He started smoking about 28 years ago. He has a 13 pack-year smoking history. He quit smokeless tobacco use about 26 years ago. His smokeless tobacco use included Snuff and Chew. He reports that he does not drink alcohol or use illicit drugs.   Family History: Family History   Problem  Relation  Age of Onset     Coronary artery disease  Father       Coronary artery disease  Mother       Coronary artery disease  Maternal Grandfather         both grandfathers and several uncles     Coronary artery disease         Hypertension  Father       Hyperlipidemia  Father       Diabetes   Father       Diabetes  Mother       Diabetes  Sister         both sisters     Hypertension  Mother       Hyperlipidemia  Mother      maternal grandfather was on dialysis lates 19s for unknown reason   Physical Exam: Filed Vitals:     10/06/13 0430  10/06/13 0500  10/06/13 0501  10/06/13 0530   BP:  133/86  133/97  133/97  141/84   Pulse:  78  79  71  73   Temp:      98.1 F (36.7 C)     TempSrc:      Oral     Resp:  11  18  18  25    Height:           Weight:           SpO2:  93%  91%  94%  91%      General appearance: alert, cooperative and no distress Head: Normocephalic, without obvious abnormality, atraumatic Eyes: negative Nose: Nares normal. Septum midline. Mucosa normal. No drainage or sinus tenderness. Neck: no JVD and supple, symmetrical, trachea midline Lungs: clear to auscultation bilaterally Heart: regular rate and rhythm, S1, S2 normal, no murmur, click, rub or gallop Abdomen: soft, non-tender; bowel sounds normal; no masses,  no organomegaly Extremities: extremities normal, atraumatic, no cyanosis or edema Pulses: 2+ and symmetric Skin: Skin color, texture, turgor normal. No rashes or lesions Neurologic: Grossly normal     Labs on Admission:    Recent Labs   10/06/13 0250   NA  138   K  4.2   CL  96   CO2  26   GLUCOSE  113*   BUN  25*   CREATININE  1.77*   CALCIUM  10.5        Recent Labs   10/06/13 0250   WBC  11.6*   NEUTROABS  6.5   HGB  14.8   HCT  43.1   MCV  92.1   PLT  201        Recent Labs   10/06/13 0250   CKTOTAL  9196*   TROPONINI  <0.30        Radiological Exams on Admission: Dg Chest 2 View   10/06/2013   CLINICAL DATA:  Shortness of breath and generalized body swelling for 3 weeks. Body aches.  EXAM: CHEST  2 VIEW  COMPARISON:  01/25/2013  FINDINGS: The heart size and mediastinal contours are within normal limits. Both lungs are clear. The visualized skeletal structures are unremarkable.  IMPRESSION:  No active cardiopulmonary disease.   Electronically Signed   By: Burman Nieves M.D.   On: 10/06/2013 03:39      Assessment/Plan   43 yo male with second episode of rhabdomyolysis with renal failure   Principal Problem:   Rhabdomyolysis-  Story is concerning for underlying rheumatological disorder.  Place on ivf and serial his cpk.  Will need full rheum eval at some point, will order a few basic rheum blood tests now.  Follow cr closely.   Active Problems:  Stable unless o/w noted   Hypertension   Hyperlipidemia   History of rhabdomyolysis   Acute renal failure see above   Full code.  Admit to med surg.   DAVID,RACHAL A 10/06/2013, 5:39 AM       CARE MANAGEMENT NOTE 10/08/2013  Patient:  SHAYMUS, EVELETH   Account Number:  [id number]  Date Initiated:  10/08/2013  Documentation initiated by:  Anibal Henderson  Subjective/Objective Assessment:   Pt admitted with rabdomyolysis. He is from home, and is independent. He will return home at D/C.     Action/Plan:   Pt has a PCP but is unable to return there because he owes money there, and they will not see himuntil he pays acct, and he cannot afford to pay. Appointments made at [facility] for follow-up   Anticipated DC Date:  10/08/2013   Anticipated DC Plan:  HOME/SELF CARE      DC Planning Services  CM consult  Follow-up appt scheduled  PCP issues      Choice offered to / List presented to:             Status of service:  Completed, signed off Medicare Important Message given?   (If response is "NO", the following Medicare IM given date fields will be blank) Date Medicare IM given:   Medicare IM given by:   Date Additional Medicare IM given:   Additional Medicare IM given by:    Discharge Disposition:  HOME/SELF CARE  Per UR Regulation:  Reviewed for med. necessity/level of care/duration of stay  If discussed at Long Length of Stay Meetings, dates discussed:    Comments:  10/08/13 1115 Anibal Henderson RN/CM   NP ordered  HH/RN but pt has no PCP, so HH will not take patient. Notified NP that HH cannot be set up

## 2013-10-08 NOTE — Progress Notes (Addendum)
Inpatient Diabetes Program Recommendations  AACE/ADA: New Consensus Statement on Inpatient Glycemic Control (2013)  Target Ranges:  Prepandial:   less than 140 mg/dL      Peak postprandial:   less than 180 mg/dL (1-2 hours)      Critically ill patients:  140 - 180 mg/dL   Reason for Assessment:  Results for Devin [name], Devin L JR. (MRN [id number]) as of 10/08/2013 09:56  Ref. Range 10/07/2013 17:02 10/07/2013 21:03 10/08/2013 07:43  Glucose-Capillary Latest Range: 70-99 mg/dL 629203 (H) 528266 (H) 413131 (H)  Results for Devin [name], Devin L JR. (MRN [id number]) as of 10/08/2013 09:56  Ref. Range 10/06/2013 06:44  Hemoglobin A1C Latest Range: <5.7 % 7.0 (H)   Diabetes history: New onset of diabetes  Current orders for Inpatient glycemic control:  Novolog sensitive tid with meals and HS  Note elevated A1C.  Placed order for bedside diabetes education by bedside RN including "Living Well with Diabetes" and Diabetes videos via patient education network.    Awaiting endocrinology consult also. Will follow.  Thanks, Beryl MeagerJenny Jeanette Moffatt, RN, BC-ADM Inpatient Diabetes Coordinator Pager [phone]     Addendum:  1000-  Spoke to patient by phone regarding new diagnosis.  He states that "everyone" in his family has diabetes too. He cannot f/u with PCP due to owing money.  He plans to F/U at health department.  States that RN just gave him the booklet.  Discussed briefly the results of his A1C.  He states he is watching the diabetes movies.  No further questions at this time.

## 2013-10-08 NOTE — Progress Notes (Signed)
M Rody viewed videos per order He denies any questions States his sister is an Charity fundraiserN and will help him

## 2013-10-09 LAB — MYOGLOBIN, SERUM: Myoglobin: 1160 mcg/L — ABNORMAL HIGH (ref <=50)

## 2013-10-10 LAB — MYOGLOBIN, URINE: MYOGLOBIN UR: UNDETERMINED

## 2014-02-23 ENCOUNTER — Emergency Department (HOSPITAL_COMMUNITY)
Admission: EM | Admit: 2014-02-23 | Discharge: 2014-02-23 | Disposition: A | Discharge status: Home / Self Care | Payer: Self-pay | Attending: Emergency Medicine | Admitting: Emergency Medicine

## 2014-02-23 ENCOUNTER — Encounter (HOSPITAL_COMMUNITY): Payer: Self-pay | Admitting: Emergency Medicine

## 2014-02-23 ENCOUNTER — Emergency Department (HOSPITAL_COMMUNITY): Payer: Self-pay

## 2014-02-23 DIAGNOSIS — Z87442 Personal history of urinary calculi: Secondary | ICD-10-CM | POA: Insufficient documentation

## 2014-02-23 DIAGNOSIS — I10 Essential (primary) hypertension: Secondary | ICD-10-CM | POA: Insufficient documentation

## 2014-02-23 DIAGNOSIS — J069 Acute upper respiratory infection, unspecified: Secondary | ICD-10-CM | POA: Insufficient documentation

## 2014-02-23 DIAGNOSIS — R1110 Vomiting, unspecified: Secondary | ICD-10-CM | POA: Insufficient documentation

## 2014-02-23 DIAGNOSIS — E785 Hyperlipidemia, unspecified: Secondary | ICD-10-CM | POA: Insufficient documentation

## 2014-02-23 DIAGNOSIS — J45909 Unspecified asthma, uncomplicated: Secondary | ICD-10-CM | POA: Insufficient documentation

## 2014-02-23 DIAGNOSIS — E119 Type 2 diabetes mellitus without complications: Secondary | ICD-10-CM | POA: Insufficient documentation

## 2014-02-23 DIAGNOSIS — Z8719 Personal history of other diseases of the digestive system: Secondary | ICD-10-CM | POA: Insufficient documentation

## 2014-02-23 DIAGNOSIS — R059 Cough, unspecified: Secondary | ICD-10-CM

## 2014-02-23 DIAGNOSIS — Z720 Tobacco use: Secondary | ICD-10-CM | POA: Insufficient documentation

## 2014-02-23 DIAGNOSIS — R05 Cough: Secondary | ICD-10-CM

## 2014-02-23 DIAGNOSIS — M791 Myalgia: Secondary | ICD-10-CM | POA: Insufficient documentation

## 2014-02-23 MED ORDER — PREDNISONE 50 MG PO TABS
60.0000 mg | ORAL_TABLET | Freq: Once | ORAL | Status: AC
  Administered 2014-02-23: 60 mg via ORAL
  Filled 2014-02-23 (×2): qty 1

## 2014-02-23 MED ORDER — PROMETHAZINE-PHENYLEPHRINE 6.25-5 MG/5ML PO SYRP
5.0000 mL | ORAL_SOLUTION | ORAL | Status: DC | PRN
Start: 2014-02-23 — End: 2015-12-12

## 2014-02-23 MED ORDER — CIPROFLOXACIN HCL 500 MG PO TABS: 500.0000 mg | ORAL_TABLET | Freq: Two times a day (BID) | ORAL | Status: DC

## 2014-02-23 MED ORDER — PSEUDOEPHEDRINE HCL 60 MG PO TABS
60.0000 mg | ORAL_TABLET | Freq: Once | ORAL | Status: AC
  Administered 2014-02-23: 60 mg via ORAL
  Filled 2014-02-23: qty 1

## 2014-02-23 MED ORDER — PREDNISONE 20 MG PO TABS: 20.0000 mg | ORAL_TABLET | Freq: Every day | ORAL | Status: DC

## 2014-02-23 NOTE — Discharge Instructions (Signed)
Upper Respiratory Infection, Adult An upper respiratory infection (URI) is also known as the common cold. It is often caused by a type of germ (virus). Colds are easily spread (contagious). You can pass it to others by kissing, coughing, sneezing, or drinking out of the same glass. Usually, you get better in 1 or 2 weeks.  HOME CARE   Only take medicine as told by your doctor.  Use a warm mist humidifier or breathe in steam from a hot shower.  Drink enough water and fluids to keep your pee (urine) clear or pale yellow.  Get plenty of rest.  Return to work when your temperature is back to normal or as told by your doctor. You may use a face mask and wash your hands to stop your cold from spreading. GET HELP RIGHT AWAY IF:   After the first few days, you feel you are getting worse.  You have questions about your medicine.  You have chills, shortness of breath, or brown or red spit (mucus).  You have yellow or brown snot (nasal discharge) or pain in the face, especially when you bend forward.  You have a fever, puffy (swollen) neck, pain when you swallow, or white spots in the back of your throat.  You have a bad headache, ear pain, sinus pain, or chest pain.  You have a high-pitched whistling sound when you breathe in and out (wheezing).  You have a lasting cough or cough up blood.  You have sore muscles or a stiff neck. MAKE SURE YOU:   Understand these instructions.  Will watch your condition.  Will get help right away if you are not doing well or get worse. Document Released: 08/16/2007 Document Revised: 05/22/2011 Document Reviewed: 06/04/2013 Exit[employer] Patient Information 2015 [employer]. This information is not intended to replace advice given to you by your health care provider. Make sure you discuss any questions you have with your health care provider.  

## 2014-02-23 NOTE — ED Notes (Signed)
Pt reports cough, congestion and body aches x 3 weeks. Pt reports intermittent fever. Pt reports emesis every morning when he gets up.

## 2014-02-23 NOTE — ED Provider Notes (Signed)
CSN: [id number]     Arrival date & time 02/23/14  1433 History  This chart was scribed for non-physician practitioner working with No att. providers found by Mercy Moore, ED Scribe. This patient was seen in room APFT22/APFT22 and the patient's care was started at 5:52 PM.   Chief Complaint  Patient presents with  . Cough   The history is provided by the patient. No language interpreter was used.   HPI Comments: Devin [name] is a 43 y.o. male who presents to the Emergency Department complaining of flu like symptoms including cough, congestion, myalgias, intermittent fever, and daily episodes of emesis upon waking each morning. Patient reports persistence of his symptoms for three weeks now; this is his first evaluation. Patient does not have PCP. Patient reports vomiting just after he wakes and experiencing nausea for the remainder of the day. Patient reports that his violent coughing throughout the day is causing him chest pain/tightness. Patient denies hemoptysis but reports production of thick, white sputum. Patient reports remote history of asthma and bronchitis in childhood, but not as an adult. Patient is an admitted smoker: .5 pack a day. Patient denies recent sick contacts.   Past Medical History  Diagnosis Date  . Hypertension   . Hyperlipidemia   . GERD (gastroesophageal reflux disease)   . Hypothyroidism   . History of rhabdomyolysis   . History of kidney stones   . Vitamin D deficiency   . Chest pain   . Tobacco abuse   . Pericardial effusion     Small, by dobutamine echocardiogram, 04/2006  . Asthma   . Kidney calculus 2014  . Type 2 diabetes mellitus without complications 03/21/3233   Past Surgical History  Procedure Laterality Date  . Varicocelectomy    . Percutaneous nephrolithotripsy    . Hernia repair      As a child  . Kidney stone surgery    . Umbilical hernia repair N/A 11/13/2012    Procedure: UMBILICAL HERNIORRHAPHY;  Surgeon: Jamesetta So, MD;  Location:  [facility];  Service: General;  Laterality: N/A;  . Insertion of mesh N/A 11/13/2012    Procedure: INSERTION OF MESH;  Surgeon: Jamesetta So, MD;  Location: [facility];  Service: General;  Laterality: N/A;  . Cholecystectomy N/A 01/27/2013    Procedure: LAPAROSCOPIC CHOLECYSTECTOMY;  Surgeon: Jamesetta So, MD;  Location: [facility];  Service: General;  Laterality: N/A;   Family History  Problem Relation Age of Onset  . Coronary artery disease Father   . Coronary artery disease Mother   . Coronary artery disease Maternal Grandfather     both grandfathers and several uncles  . Coronary artery disease    . Hypertension Father   . Hyperlipidemia Father   . Diabetes Father   . Diabetes Mother   . Diabetes Sister     both sisters  . Hypertension Mother   . Hyperlipidemia Mother    History  Substance Use Topics  . Smoking status: Current Every Day Smoker -- 0.50 packs/day for 26 years    Types: Cigarettes    Start date: 03/13/1985  . Smokeless tobacco: Former Systems developer    Types: Snuff, Sarina Ser    Quit date: 03/14/1987     Comment: started smoking at age 29. chewed and dipped for 2 years  . Alcohol Use: No    Review of Systems  Constitutional: Positive for fever. Negative for chills.  HENT: Positive for congestion.   Respiratory: Positive for cough. Negative for shortness  of breath.   Cardiovascular: Negative for chest pain.  Gastrointestinal: Positive for vomiting.  Musculoskeletal: Positive for myalgias.  All other systems reviewed and are negative.   Allergies  Bee venom and Crestor  Home Medications   Prior to Admission medications   Medication Sig Start Date End Date Taking? Authorizing Provider  Blood Glucose Monitoring Suppl W/DEVICE KIT Dispense based on patient and insurance preference. Check blood sugar daily before breakfast (ICD9 250.0) 10/08/13   Kathie Dike, MD  levothyroxine (SYNTHROID, LEVOTHROID) 75 MCG tablet Take 3 tablets (225 mcg total) by mouth daily before  breakfast. 10/09/13   Kathie Dike, MD  lisinopril (PRINIVIL,ZESTRIL) 2.5 MG tablet Take 1 tablet (2.5 mg total) by mouth daily. 10/08/13   Radene Gunning, NP  metFORMIN (GLUCOPHAGE) 500 MG tablet Take 1 tablet (500 mg total) by mouth daily with breakfast. 10/09/13   Kathie Dike, MD  nitroGLYCERIN (NITROSTAT) 0.4 MG SL tablet Place 0.4 mg under the tongue every 5 (five) minutes as needed for chest pain.    Historical Provider, MD  ranitidine (ZANTAC) 150 MG tablet Take 150-300 mg by mouth daily.    Historical Provider, MD   Triage Vitals: BP 156/92 mmHg  Pulse 78  Temp(Src) 98.2 F (36.8 C) (Oral)  Resp 16  Ht _0  (1.981 m)  Wt 344 lb (156.037 kg)  BMI 39.76 kg/m2  SpO2 98% Physical Exam  Constitutional: He is oriented to person, place, and time. He appears well-developed and well-nourished. No distress.  HENT:  Head: Normocephalic and atraumatic.  Mouth/Throat: Oropharynx is clear and moist.  Nasal congestion present  Eyes: EOM are normal.  Neck: Neck supple. No tracheal deviation present.  Cardiovascular: Normal rate.   Pulmonary/Chest: Effort normal. No respiratory distress.  Few scattered rhonchi, no wheezes.  Musculoskeletal: Normal range of motion.  Cap refill < 2 seconds. No edema.   Neurological: He is alert and oriented to person, place, and time.  Skin: Skin is warm and dry.  Psychiatric: He has a normal mood and affect. His behavior is normal.  Nursing note and vitals reviewed.   ED Course  Procedures (including critical care time)  COORDINATION OF CARE: 5:58 PM- Plans to treat symptomatically. Discussed treatment plan with patient at bedside and patient agreed to plan.   Labs Review Labs Reviewed - No data to display  Imaging Review Dg Chest 2 View  02/23/2014   CLINICAL DATA:  Cough  EXAM: CHEST  2 VIEW  COMPARISON:  10/06/2013  FINDINGS: The heart size and mediastinal contours are within normal limits. Both lungs are clear. The visualized skeletal  structures are unremarkable.  IMPRESSION: No active cardiopulmonary disease.   Electronically Signed   By: Franchot Gallo M.D.   On: 02/23/2014 15:28     EKG Interpretation None      MDM  Chest xray reveals both lungs clear. Vital signs are within normal limits.  Exam suggest URI with bronchitis.Marland Kitchen  Rx for cipro and prednisone, and promethazine VC cough med given to the patient.    Final diagnoses:  URI (upper respiratory infection)    **I have reviewed nursing notes, vital signs, and all appropriate lab and imaging results for this patient.*  I personally performed the services described in this documentation, which was scribed in my presence. The recorded information has been reviewed and is accurate.    Lenox Ahr, PA-C 02/24/14 [place], DO 02/25/14 [phone]

## 2015-12-12 ENCOUNTER — Emergency Department (HOSPITAL_COMMUNITY): Payer: Managed Care, Other (non HMO)

## 2015-12-12 ENCOUNTER — Emergency Department (HOSPITAL_COMMUNITY)
Admission: EM | Admit: 2015-12-12 | Discharge: 2015-12-12 | Disposition: A | Discharge status: Home / Self Care | Payer: Managed Care, Other (non HMO) | Attending: Emergency Medicine | Admitting: Emergency Medicine

## 2015-12-12 ENCOUNTER — Encounter (HOSPITAL_COMMUNITY): Payer: Self-pay | Admitting: Emergency Medicine

## 2015-12-12 DIAGNOSIS — E039 Hypothyroidism, unspecified: Secondary | ICD-10-CM | POA: Diagnosis not present

## 2015-12-12 DIAGNOSIS — I10 Essential (primary) hypertension: Secondary | ICD-10-CM | POA: Insufficient documentation

## 2015-12-12 DIAGNOSIS — S61219A Laceration without foreign body of unspecified finger without damage to nail, initial encounter: Secondary | ICD-10-CM

## 2015-12-12 DIAGNOSIS — Y9389 Activity, other specified: Secondary | ICD-10-CM | POA: Diagnosis not present

## 2015-12-12 DIAGNOSIS — Z23 Encounter for immunization: Secondary | ICD-10-CM | POA: Diagnosis not present

## 2015-12-12 DIAGNOSIS — F17210 Nicotine dependence, cigarettes, uncomplicated: Secondary | ICD-10-CM | POA: Diagnosis not present

## 2015-12-12 DIAGNOSIS — E119 Type 2 diabetes mellitus without complications: Secondary | ICD-10-CM | POA: Insufficient documentation

## 2015-12-12 DIAGNOSIS — Z79899 Other long term (current) drug therapy: Secondary | ICD-10-CM | POA: Diagnosis not present

## 2015-12-12 DIAGNOSIS — Y929 Unspecified place or not applicable: Secondary | ICD-10-CM | POA: Insufficient documentation

## 2015-12-12 DIAGNOSIS — W278XXA Contact with other nonpowered hand tool, initial encounter: Secondary | ICD-10-CM | POA: Insufficient documentation

## 2015-12-12 DIAGNOSIS — J45909 Unspecified asthma, uncomplicated: Secondary | ICD-10-CM | POA: Diagnosis not present

## 2015-12-12 DIAGNOSIS — S61212A Laceration without foreign body of right middle finger without damage to nail, initial encounter: Secondary | ICD-10-CM | POA: Diagnosis not present

## 2015-12-12 DIAGNOSIS — Z7984 Long term (current) use of oral hypoglycemic drugs: Secondary | ICD-10-CM | POA: Insufficient documentation

## 2015-12-12 DIAGNOSIS — S6991XA Unspecified injury of right wrist, hand and finger(s), initial encounter: Secondary | ICD-10-CM | POA: Diagnosis present

## 2015-12-12 DIAGNOSIS — Y999 Unspecified external cause status: Secondary | ICD-10-CM | POA: Insufficient documentation

## 2015-12-12 MED ORDER — TETANUS-DIPHTH-ACELL PERTUSSIS 5-2.5-18.5 LF-MCG/0.5 IM SUSP
0.5000 mL | Freq: Once | INTRAMUSCULAR | Status: AC
  Administered 2015-12-12: 0.5 mL via INTRAMUSCULAR
  Filled 2015-12-12: qty 0.5

## 2015-12-12 MED ORDER — CEPHALEXIN 500 MG PO CAPS
500.0000 mg | ORAL_CAPSULE | Freq: Once | ORAL | Status: AC
  Administered 2015-12-12: 500 mg via ORAL
  Filled 2015-12-12: qty 1

## 2015-12-12 MED ORDER — CEPHALEXIN 500 MG PO CAPS: 500.0000 mg | ORAL_CAPSULE | Freq: Four times a day (QID) | ORAL | 0 refills | Status: DC

## 2015-12-12 MED ORDER — HYDROCODONE-ACETAMINOPHEN 7.5-325 MG PO TABS: 1.0000 | ORAL_TABLET | Freq: Four times a day (QID) | ORAL | 0 refills | Status: DC | PRN

## 2015-12-12 MED ORDER — LIDOCAINE HCL (PF) 2 % IJ SOLN
10.0000 mL | Freq: Once | INTRAMUSCULAR | Status: AC
  Administered 2015-12-12: 10 mL via INTRADERMAL
  Filled 2015-12-12: qty 10
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
## 2015-12-12 NOTE — Discharge Instructions (Signed)
Keep the finger clean and splinted.  Call Dr. Glenna Durandrtmann's office to arrange a follow-up appt.
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
## 2017-04-28 ENCOUNTER — Other Ambulatory Visit: Payer: Self-pay

## 2017-04-28 ENCOUNTER — Emergency Department (HOSPITAL_COMMUNITY)
Admission: EM | Admit: 2017-04-28 | Discharge: 2017-04-29 | Disposition: A | Discharge status: Home / Self Care | Payer: Self-pay | Attending: Emergency Medicine | Admitting: Emergency Medicine

## 2017-04-28 ENCOUNTER — Emergency Department (HOSPITAL_COMMUNITY): Payer: Self-pay

## 2017-04-28 ENCOUNTER — Encounter (HOSPITAL_COMMUNITY): Payer: Self-pay | Admitting: *Deleted

## 2017-04-28 DIAGNOSIS — F17210 Nicotine dependence, cigarettes, uncomplicated: Secondary | ICD-10-CM | POA: Insufficient documentation

## 2017-04-28 DIAGNOSIS — E039 Hypothyroidism, unspecified: Secondary | ICD-10-CM | POA: Insufficient documentation

## 2017-04-28 DIAGNOSIS — Z7984 Long term (current) use of oral hypoglycemic drugs: Secondary | ICD-10-CM | POA: Insufficient documentation

## 2017-04-28 DIAGNOSIS — Z79899 Other long term (current) drug therapy: Secondary | ICD-10-CM | POA: Insufficient documentation

## 2017-04-28 DIAGNOSIS — J45909 Unspecified asthma, uncomplicated: Secondary | ICD-10-CM | POA: Insufficient documentation

## 2017-04-28 DIAGNOSIS — M6282 Rhabdomyolysis: Secondary | ICD-10-CM | POA: Insufficient documentation

## 2017-04-28 DIAGNOSIS — E119 Type 2 diabetes mellitus without complications: Secondary | ICD-10-CM | POA: Insufficient documentation

## 2017-04-28 DIAGNOSIS — R0789 Other chest pain: Secondary | ICD-10-CM | POA: Insufficient documentation

## 2017-04-28 DIAGNOSIS — I10 Essential (primary) hypertension: Secondary | ICD-10-CM | POA: Insufficient documentation

## 2017-04-28 DIAGNOSIS — E785 Hyperlipidemia, unspecified: Secondary | ICD-10-CM | POA: Insufficient documentation

## 2017-04-28 NOTE — ED Triage Notes (Addendum)
Pt reports chest prssure and left arm numbness and tingling x 3 days.  Pt also reports right flank pain.

## 2017-04-29 LAB — TROPONIN I: Troponin I: <0.03 ng/mL (ref <0.03)

## 2017-04-29 LAB — CK
Total CK: 1950 U/L — ABNORMAL HIGH (ref 49–397)
Total CK: 2115 U/L — ABNORMAL HIGH (ref 49–397)

## 2017-04-29 LAB — BASIC METABOLIC PANEL
Anion gap: 13 (ref 5–15)
BUN: 14 mg/dL (ref 6–20)
CHLORIDE: 98 mmol/L — AB (ref 101–111)
CO2: 26 mmol/L (ref 22–32)
Calcium: 10 mg/dL (ref 8.9–10.3)
Creatinine, Ser: 1.45 mg/dL — ABNORMAL HIGH (ref 0.61–1.24)
GFR calc Af Amer: >60 mL/min (ref >60)
GFR, EST NON AFRICAN AMERICAN: 57 mL/min — AB (ref >60)
GLUCOSE: 372 mg/dL — AB (ref 65–99)
POTASSIUM: 4.1 mmol/L (ref 3.5–5.1)
Sodium: 137 mmol/L (ref 135–145)

## 2017-04-29 LAB — CBC
HEMATOCRIT: 44.4 % (ref 39.0–52.0)
Hemoglobin: 14.7 g/dL (ref 13.0–17.0)
MCH: 30.2 pg (ref 26.0–34.0)
MCHC: 33.1 g/dL (ref 30.0–36.0)
MCV: 91.4 fL (ref 78.0–100.0)
Platelets: 234 10*3/uL (ref 150–400)
RBC: 4.86 MIL/uL (ref 4.22–5.81)
RDW: 14.6 % (ref 11.5–15.5)
WBC: 11.6 10*3/uL — ABNORMAL HIGH (ref 4.0–10.5)

## 2017-04-29 LAB — CBG MONITORING, ED: Glucose-Capillary: 258 mg/dL — ABNORMAL HIGH (ref 65–99)

## 2017-04-29 MED ORDER — BLOOD GLUCOSE MONITORING SUPPL W/DEVICE KIT: PACK | 0 refills | Status: DC

## 2017-04-29 MED ORDER — SODIUM CHLORIDE 0.9 % IV BOLUS (SEPSIS)
1000.0000 mL | Freq: Once | INTRAVENOUS | Status: AC
  Administered 2017-04-29: 1000 mL via INTRAVENOUS

## 2017-04-29 MED ORDER — LISINOPRIL 2.5 MG PO TABS: 2.5000 mg | ORAL_TABLET | Freq: Every day | ORAL | 3 refills | Status: DC

## 2017-04-29 MED ORDER — GEMFIBROZIL 600 MG PO TABS: 600.0000 mg | ORAL_TABLET | Freq: Two times a day (BID) | ORAL | 3 refills | Status: DC

## 2017-04-29 MED ORDER — TRAMADOL HCL 50 MG PO TABS: 50.0000 mg | ORAL_TABLET | Freq: Four times a day (QID) | ORAL | 0 refills | Status: DC | PRN

## 2017-04-29 MED ORDER — METFORMIN HCL 1000 MG PO TABS: 1000.0000 mg | ORAL_TABLET | Freq: Two times a day (BID) | ORAL | 3 refills | Status: DC

## 2017-04-29 MED ORDER — GLIPIZIDE 10 MG PO TABS: 10.0000 mg | ORAL_TABLET | Freq: Two times a day (BID) | ORAL | 3 refills | Status: DC

## 2017-04-29 MED ORDER — LEVOTHYROXINE SODIUM 125 MCG PO TABS: 125.0000 ug | ORAL_TABLET | Freq: Every day | ORAL | 3 refills | Status: DC

## 2017-04-29 NOTE — ED Notes (Signed)
Pt ambulatory to waiting room. Pt verbalized understanding of discharge instructions.   

## 2017-04-29 NOTE — ED Provider Notes (Signed)
[facility] EMERGENCY DEPARTMENT Provider Note   CSN: [id number] Arrival date & time: 04/28/17  2157     History   Chief Complaint Chief Complaint  Patient presents with  . Chest Pain    HPI Devin [name] is a 47 y.o. male.  Patient presents to the emergency department for evaluation of chest pain.  Symptoms have been ongoing for several days.  He has had pain in the center of his chest as well as the left side of his chest.  He does report that he at times has felt slightly short of breath.  He also has a history of severe GERD.  He took 2 Zantac tonight for the symptoms and has had some improvement.      Past Medical History:  Diagnosis Date  . Asthma   . Chest pain   . GERD (gastroesophageal reflux disease)   . History of kidney stones   . History of rhabdomyolysis   . Hyperlipidemia   . Hypertension   . Hypothyroidism   . Kidney calculus 2014  . Pericardial effusion    Small, by dobutamine echocardiogram, 04/2006  . Tobacco abuse   . Type 2 diabetes mellitus without complications ([place]) 0/12/710  . Vitamin D deficiency     Patient Active Problem List   Diagnosis Date Noted  . Hypothyroidism 10/07/2013  . Hyponatremia 10/07/2013  . Type 2 diabetes mellitus without complications ([place]) 19/75/8832  . Rhabdomyolysis 10/06/2013  . Acute renal failure ([place]) 10/06/2013  . Elevated AST (SGOT) 10/06/2013  . Elevated ALT measurement 10/06/2013  . Myalgia and myositis, unspecified 10/06/2013  . Chest pain   . Tobacco abuse   . Hypertension   . Hyperlipidemia   . Pericardial effusion   . History of rhabdomyolysis     Past Surgical History:  Procedure Laterality Date  . CHOLECYSTECTOMY N/A 01/27/2013   Procedure: LAPAROSCOPIC CHOLECYSTECTOMY;  Surgeon: Jamesetta So, MD;  Location: [facility];  Service: General;  Laterality: N/A;  . HERNIA REPAIR     As a child  . INSERTION OF MESH N/A 11/13/2012   Procedure: INSERTION OF MESH;  Surgeon: Jamesetta So, MD;   Location: [facility];  Service: General;  Laterality: N/A;  . KIDNEY STONE SURGERY    . PERCUTANEOUS NEPHROLITHOTRIPSY    . UMBILICAL HERNIA REPAIR N/A 11/13/2012   Procedure: UMBILICAL HERNIORRHAPHY;  Surgeon: Jamesetta So, MD;  Location: [facility];  Service: General;  Laterality: N/A;  . VARICOCELECTOMY         Home Medications    Prior to Admission medications   Medication Sig Start Date End Date Taking? Authorizing Provider  Blood Glucose Monitoring Suppl w/Device KIT Dispense based on patient and insurance preference. Check blood sugar daily before breakfast (ICD9 250.0) 04/29/17   Julyssa Kyer, Gwenyth Allegra, MD  cephALEXin (KEFLEX) 500 MG capsule Take 1 capsule (500 mg total) by mouth 4 (four) times daily. 12/12/15   Triplett, Tammy, PA-C  gemfibrozil (LOPID) 600 MG tablet Take 1 tablet (600 mg total) by mouth 2 (two) times daily. 04/29/17   Orpah Greek, MD  glipiZIDE (GLUCOTROL) 10 MG tablet Take 1 tablet (10 mg total) by mouth 2 (two) times daily. 04/29/17   Orpah Greek, MD  HYDROcodone-acetaminophen (NORCO) 7.5-325 MG tablet Take 1 tablet by mouth every 6 (six) hours as needed for moderate pain. 12/12/15   Triplett, Tammy, PA-C  levothyroxine (SYNTHROID, LEVOTHROID) 125 MCG tablet Take 1 tablet (125 mcg total) by mouth daily before breakfast.  04/29/17   Orpah Greek, MD  lisinopril (PRINIVIL,ZESTRIL) 2.5 MG tablet Take 1 tablet (2.5 mg total) by mouth daily. 04/29/17   Orpah Greek, MD  metFORMIN (GLUCOPHAGE) 1000 MG tablet Take 1 tablet (1,000 mg total) by mouth 2 (two) times daily. 04/29/17   Orpah Greek, MD  nitroGLYCERIN (NITROSTAT) 0.4 MG SL tablet Place 0.4 mg under the tongue every 5 (five) minutes as needed for chest pain.    [provider]  ranitidine (ZANTAC) 150 MG tablet Take 150-300 mg by mouth daily.    [provider]  traMADol (ULTRAM) 50 MG tablet Take 1 tablet (50 mg total) by mouth every 6 (six) hours as needed.  04/29/17   Orpah Greek, MD  UNKNOWN TO PATIENT Inject 15 Units into the skin 2 (two) times daily as needed (for high blood sugar levels).    [provider]    Family History Family History  Problem Relation Age of Onset  . Coronary artery disease Father   . Hypertension Father   . Hyperlipidemia Father   . Diabetes Father   . Coronary artery disease Mother   . Diabetes Mother   . Hypertension Mother   . Hyperlipidemia Mother   . Coronary artery disease Maternal Grandfather        both grandfathers and several uncles  . Coronary artery disease Unknown   . Diabetes Sister        both sisters    Social History Social History   Tobacco Use  . Smoking status: Current Every Day Smoker    Packs/day: 0.50    Years: 26.00    Pack years: 13.00    Types: Cigarettes    Start date: 03/13/1985  . Smokeless tobacco: Former Systems developer    Types: Snuff, Chew    Quit date: 03/14/1987  . Tobacco comment: started smoking at age 17. chewed and dipped for 2 years  Substance Use Topics  . Alcohol use: No  . Drug use: No     Allergies   Bee venom and Crestor [rosuvastatin]   Review of Systems Review of Systems  Cardiovascular: Positive for chest pain.  All other systems reviewed and are negative.    Physical Exam Updated Vital Signs BP (!) 142/88   Pulse 70   Temp 98.4 F (36.9 C)   Resp 15   Ht _0  (1.981 m)   Wt 136.1 kg (300 lb)   SpO2 94%   BMI 34.67 kg/m   Physical Exam  Constitutional: He is oriented to person, place, and time. He appears well-developed and well-nourished. No distress.  HENT:  Head: Normocephalic and atraumatic.  Right Ear: Hearing normal.  Left Ear: Hearing normal.  Nose: Nose normal.  Mouth/Throat: Oropharynx is clear and moist and mucous membranes are normal.  Eyes: Conjunctivae and EOM are normal. Pupils are equal, round, and reactive to light.  Neck: Normal range of motion. Neck supple.  Cardiovascular: Regular rhythm, S1  normal and S2 normal. Exam reveals no gallop and no friction rub.  No murmur heard. Pulmonary/Chest: Effort normal and breath sounds normal. No respiratory distress. He exhibits no tenderness.  Abdominal: Soft. Normal appearance and bowel sounds are normal. There is no hepatosplenomegaly. There is no tenderness. There is no rebound, no guarding, no tenderness at McBurney's point and negative Murphy's sign. No hernia.  Musculoskeletal: Normal range of motion.  Neurological: He is alert and oriented to person, place, and time. He has normal strength. No cranial nerve  deficit or sensory deficit. Coordination normal. GCS eye subscore is 4. GCS verbal subscore is 5. GCS motor subscore is 6.  Skin: Skin is warm, dry and intact. No rash noted. No cyanosis.  Psychiatric: He has a normal mood and affect. His speech is normal and behavior is normal. Thought content normal.  Nursing note and vitals reviewed.    ED Treatments / Results  Labs (all labs ordered are listed, but only abnormal results are displayed) Labs Reviewed  BASIC METABOLIC PANEL - Abnormal; Notable for the following components:      Result Value   Chloride 98 (*)    Glucose, Bld 372 (*)    Creatinine, Ser 1.45 (*)    GFR calc non Af Amer 57 (*)    All other components within normal limits  CBC - Abnormal; Notable for the following components:   WBC 11.6 (*)    All other components within normal limits  CK - Abnormal; Notable for the following components:   Total CK 2,115 (*)    All other components within normal limits  CK - Abnormal; Notable for the following components:   Total CK 1,950 (*)    All other components within normal limits  CBG MONITORING, ED - Abnormal; Notable for the following components:   Glucose-Capillary 258 (*)    All other components within normal limits  TROPONIN I    EKG  EKG Interpretation None       Radiology Dg Chest 2 View  Result Date: 04/28/2017 CLINICAL DATA:  Chest pressure and  left arm numbness with tingling x3 days. Right flank pain. EXAM: CHEST  2 VIEW COMPARISON:  02/23/2014 FINDINGS: Heart is normal in size. No aortic aneurysm. There is atelectasis and/or scarring at the lung bases. No pulmonary consolidation nor overt pulmonary edema. No effusion or pneumothorax. No acute nor suspicious osseous abnormalities. Degenerative changes are stable along the dorsal spine. IMPRESSION: No active cardiopulmonary disease. Electronically Signed   By: Ashley Royalty M.D.   On: 04/28/2017 22:48    Procedures Procedures (including critical care time)  Medications Ordered in ED Medications  sodium chloride 0.9 % bolus 1,000 mL (0 mLs Intravenous Stopped 04/29/17 0151)  sodium chloride 0.9 % bolus 1,000 mL (0 mLs Intravenous Stopped 04/29/17 0400)     Initial Impression / Assessment and Plan / ED Course  I have reviewed the triage vital signs and the nursing notes.  Pertinent labs & imaging results that were available during my care of the patient were reviewed by me and considered in my medical decision making (see chart for details).     Patient presents to the ER for evaluation of chest discomfort.  He does have multiple cardiac risk factors, however, he has been evaluated by cardiology.  He had a heart catheterization in 2014 that showed minimal, nonobstructive coronary disease.  Symptoms today are not felt to be cardiac in nature.  He has had pain for 3 days.  EKG is unchanged, troponin negative.  Patient does, however, report that this feels similar to when he had rhabdomyolysis in the past.  He had nontraumatic, unexplained rhabdo.  CPK today was over 2000.  He was administered IV fluids and his CPKs are dropping.  Reviewing records he was admitted for CPK above 9000 last time, discharged with a CPK of 3000.  Etiology is unclear, but patient will not require admission at this time as his CKs are dropping.  We will have him rest, hydrate and return for any  worsening pain.  Final  Clinical Impressions(s) / ED Diagnoses   Final diagnoses:  Atypical chest pain  Non-traumatic rhabdomyolysis    ED Discharge Orders        Ordered    gemfibrozil (LOPID) 600 MG tablet  2 times daily     04/29/17 0356    glipiZIDE (GLUCOTROL) 10 MG tablet  2 times daily     04/29/17 0356    Blood Glucose Monitoring Suppl w/Device KIT     04/29/17 0356    metFORMIN (GLUCOPHAGE) 1000 MG tablet  2 times daily     04/29/17 0356    lisinopril (PRINIVIL,ZESTRIL) 2.5 MG tablet  Daily     04/29/17 0356    levothyroxine (SYNTHROID, LEVOTHROID) 125 MCG tablet  Daily before breakfast     04/29/17 0356    traMADol (ULTRAM) 50 MG tablet  Every 6 hours PRN     04/29/17 0356       Orpah Greek, MD 04/29/17 [id number]

## 2017-07-05 ENCOUNTER — Emergency Department (HOSPITAL_COMMUNITY)
Admission: EM | Admit: 2017-07-05 | Discharge: 2017-07-05 | Disposition: A | Discharge status: Home / Self Care | Payer: Self-pay | Attending: Emergency Medicine | Admitting: Emergency Medicine

## 2017-07-05 ENCOUNTER — Other Ambulatory Visit: Payer: Self-pay

## 2017-07-05 ENCOUNTER — Encounter (HOSPITAL_COMMUNITY): Payer: Self-pay | Admitting: *Deleted

## 2017-07-05 DIAGNOSIS — J45909 Unspecified asthma, uncomplicated: Secondary | ICD-10-CM | POA: Insufficient documentation

## 2017-07-05 DIAGNOSIS — R51 Headache: Secondary | ICD-10-CM | POA: Insufficient documentation

## 2017-07-05 DIAGNOSIS — Z79899 Other long term (current) drug therapy: Secondary | ICD-10-CM | POA: Insufficient documentation

## 2017-07-05 DIAGNOSIS — E039 Hypothyroidism, unspecified: Secondary | ICD-10-CM | POA: Insufficient documentation

## 2017-07-05 DIAGNOSIS — I10 Essential (primary) hypertension: Secondary | ICD-10-CM | POA: Insufficient documentation

## 2017-07-05 DIAGNOSIS — Z7984 Long term (current) use of oral hypoglycemic drugs: Secondary | ICD-10-CM | POA: Insufficient documentation

## 2017-07-05 DIAGNOSIS — R42 Dizziness and giddiness: Secondary | ICD-10-CM

## 2017-07-05 DIAGNOSIS — E119 Type 2 diabetes mellitus without complications: Secondary | ICD-10-CM | POA: Insufficient documentation

## 2017-07-05 DIAGNOSIS — F17210 Nicotine dependence, cigarettes, uncomplicated: Secondary | ICD-10-CM | POA: Insufficient documentation

## 2017-07-05 MED ORDER — MECLIZINE HCL 25 MG PO TABS: 25.0000 mg | ORAL_TABLET | Freq: Two times a day (BID) | ORAL | 0 refills | Status: DC | PRN

## 2017-07-05 NOTE — ED Triage Notes (Signed)
STATES HE HAS HAD A HEADACHE SINCE 9/18. STATES HE ALSO NOW HAS DIZZINESS FOR 6 WEEKS, [facility]

## 2017-07-05 NOTE — ED Notes (Signed)
Pt reports HA since September 2018, has been evaluated multiple times at another facility. States he was told he had an ear infection, despite medication it did not get better. [facility] and had an MRI that showed nothing per pt. States dizziness and HA gets worse with movement from side to side, this subsides while sitting. Denies hx of vertigo.

## 2017-07-05 NOTE — Discharge Instructions (Signed)
Medication for dizziness or lightheadedness.  Follow-up with primary care or neurologist.  Phone numbers given.

## 2017-07-05 NOTE — ED Provider Notes (Signed)
[facility] EMERGENCY DEPARTMENT Provider Note   CSN: [id number] Arrival date & time: 07/05/17  1701     History   Chief Complaint Chief Complaint  Patient presents with  . Headache    HPI Devin [name] is a 47 y.o. male.  Patient has concerns about persistent headache, dizzy spells, visual changes, disequilibrium.  Symptoms first started in September 2018 when he had a alleged earache requiring antibiotic pills and drops.  MRI of his brain on 06/27/2017 was normal.  He is able to work.  Past medical history includes diabetes, hypercholesterolemia, rhabdomyolysis, smoking, hypothyroidism.  Symptoms are worse with standing and movements from side to side.  Improved with sitting or lying down.     Past Medical History:  Diagnosis Date  . Asthma   . Chest pain   . GERD (gastroesophageal reflux disease)   . History of kidney stones   . History of rhabdomyolysis   . Hyperlipidemia   . Hypertension   . Hypothyroidism   . Kidney calculus 2014  . Pericardial effusion    Small, by dobutamine echocardiogram, 04/2006  . Tobacco abuse   . Type 2 diabetes mellitus without complications ([place]) 5/85/9292  . Vitamin D deficiency     Patient Active Problem List   Diagnosis Date Noted  . Hypothyroidism 10/07/2013  . Hyponatremia 10/07/2013  . Type 2 diabetes mellitus without complications ([place]) 44/62/8638  . Rhabdomyolysis 10/06/2013  . Acute renal failure ([place]) 10/06/2013  . Elevated AST (SGOT) 10/06/2013  . Elevated ALT measurement 10/06/2013  . Myalgia and myositis, unspecified 10/06/2013  . Chest pain   . Tobacco abuse   . Hypertension   . Hyperlipidemia   . Pericardial effusion   . History of rhabdomyolysis     Past Surgical History:  Procedure Laterality Date  . CHOLECYSTECTOMY N/A 01/27/2013   Procedure: LAPAROSCOPIC CHOLECYSTECTOMY;  Surgeon: Jamesetta So, MD;  Location: [facility];  Service: General;  Laterality: N/A;  . HERNIA REPAIR     As a child  . INSERTION  OF MESH N/A 11/13/2012   Procedure: INSERTION OF MESH;  Surgeon: Jamesetta So, MD;  Location: [facility];  Service: General;  Laterality: N/A;  . KIDNEY STONE SURGERY    . PERCUTANEOUS NEPHROLITHOTRIPSY    . UMBILICAL HERNIA REPAIR N/A 11/13/2012   Procedure: UMBILICAL HERNIORRHAPHY;  Surgeon: Jamesetta So, MD;  Location: [facility];  Service: General;  Laterality: N/A;  . VARICOCELECTOMY          Home Medications    Prior to Admission medications   Medication Sig Start Date End Date Taking? Authorizing Provider  amoxicillin (AMOXIL) 500 MG capsule Take 500 mg by mouth 3 (three) times daily. 7 day course starting on 06/27/2017 06/27/17  Yes [provider]  gemfibrozil (LOPID) 600 MG tablet Take 1 tablet (600 mg total) by mouth 2 (two) times daily. 04/29/17  Yes Pollina, Gwenyth Allegra, MD  glipiZIDE (GLUCOTROL) 10 MG tablet Take 1 tablet (10 mg total) by mouth 2 (two) times daily. 04/29/17  Yes Pollina, Gwenyth Allegra, MD  insulin NPH-regular Human (NOVOLIN 70/30) (70-30) 100 UNIT/ML injection Inject 15 Units into the skin 2 (two) times daily as needed.   Yes [provider]  levothyroxine (SYNTHROID, LEVOTHROID) 125 MCG tablet Take 1 tablet (125 mcg total) by mouth daily before breakfast. 04/29/17  Yes Pollina, Gwenyth Allegra, MD  lisinopril (PRINIVIL,ZESTRIL) 2.5 MG tablet Take 1 tablet (2.5 mg total) by mouth daily. 04/29/17  Yes Pollina, Gwenyth Allegra,  MD  metFORMIN (GLUCOPHAGE) 1000 MG tablet Take 1 tablet (1,000 mg total) by mouth 2 (two) times daily. 04/29/17  Yes Pollina, Gwenyth Allegra, MD  nitroGLYCERIN (NITROSTAT) 0.4 MG SL tablet Place 0.4 mg under the tongue every 5 (five) minutes as needed for chest pain.   Yes [provider]  ranitidine (ZANTAC) 150 MG tablet Take 150-300 mg by mouth daily.   Yes [provider]  Blood Glucose Monitoring Suppl w/Device KIT Dispense based on patient and insurance preference. Check blood sugar daily before breakfast (ICD9  250.0) 04/29/17   Pollina, Gwenyth Allegra, MD  meclizine (ANTIVERT) 25 MG tablet Take 1 tablet (25 mg total) by mouth 2 (two) times daily as needed for dizziness. 07/05/17   Nat Christen, MD    Family History Family History  Problem Relation Age of Onset  . Coronary artery disease Father   . Hypertension Father   . Hyperlipidemia Father   . Diabetes Father   . Coronary artery disease Mother   . Diabetes Mother   . Hypertension Mother   . Hyperlipidemia Mother   . Coronary artery disease Maternal Grandfather        both grandfathers and several uncles  . Coronary artery disease Unknown   . Diabetes Sister        both sisters    Social History Social History   Tobacco Use  . Smoking status: Current Every Day Smoker    Packs/day: 0.50    Years: 26.00    Pack years: 13.00    Types: Cigarettes    Start date: 03/13/1985  . Smokeless tobacco: Former Systems developer    Types: Snuff, Chew    Quit date: 03/14/1987  . Tobacco comment: started smoking at age 6. chewed and dipped for 2 years  Substance Use Topics  . Alcohol use: No  . Drug use: No     Allergies   Bee venom and Crestor [rosuvastatin]   Review of Systems Review of Systems  All other systems reviewed and are negative.    Physical Exam Updated Vital Signs BP 123/90   Pulse 78   Temp (!) 96.6 F (35.9 C) (Oral)   Resp 18   Ht 6' 6" (1.981 m)   Wt 136.1 kg (300 lb)   SpO2 96%   BMI 34.67 kg/m   Physical Exam  Constitutional: He is oriented to person, place, and time. He appears well-developed and well-nourished.  HENT:  Head: Normocephalic and atraumatic.  Eyes: Conjunctivae are normal.  Neck: Neck supple.  Cardiovascular: Normal rate and regular rhythm.  Pulmonary/Chest: Effort normal and breath sounds normal.  Abdominal: Soft. Bowel sounds are normal.  Musculoskeletal: Normal range of motion.  Neurological: He is alert and oriented to person, place, and time.  Skin: Skin is warm and dry.  Psychiatric: He has  a normal mood and affect. His behavior is normal.  Nursing note and vitals reviewed.    ED Treatments / Results  Labs (all labs ordered are listed, but only abnormal results are displayed) Labs Reviewed - No data to display  EKG EKG Interpretation  Date/Time:  Thursday July 05 2017 17:18:28 EDT Ventricular Rate:  89 PR Interval:  144 QRS Duration: 104 QT Interval:  364 QTC Calculation: 442 R Axis:   117 Text Interpretation:  Normal sinus rhythm Left posterior fascicular block T wave abnormality, consider inferior ischemia Abnormal ECG Confirmed by Nat Christen [phone]) on 07/05/2017 7:01:27 PM   Radiology No results found.  Procedures Procedures (including critical  care time)  Medications Ordered in ED Medications - No data to display   Initial Impression / Assessment and Plan / ED Course  I have reviewed the triage vital signs and the nursing notes.  Pertinent labs & imaging results that were available during my care of the patient were reviewed by me and considered in my medical decision making (see chart for details).     Patient exhibits no gross neurological deficits.  An MRI on 06/27/2017 was normal.  Normal physical exam here.  Rx meclizine 25 mg.  Referral to primary care and neurology.  Final Clinical Impressions(s) / ED Diagnoses   Final diagnoses:  Lightheadedness    ED Discharge Orders        Ordered    meclizine (ANTIVERT) 25 MG tablet  2 times daily PRN     07/05/17 2006       Nat Christen, MD 07/05/17 2010

## 2017-11-13 ENCOUNTER — Emergency Department (HOSPITAL_COMMUNITY): Payer: Self-pay

## 2017-11-13 ENCOUNTER — Encounter (HOSPITAL_COMMUNITY): Payer: Self-pay | Admitting: Emergency Medicine

## 2017-11-13 ENCOUNTER — Other Ambulatory Visit: Payer: Self-pay

## 2017-11-13 ENCOUNTER — Inpatient Hospital Stay (HOSPITAL_COMMUNITY)
Admission: EM | Admit: 2017-11-13 | Discharge: 2017-11-16 | DRG: 247 | Disposition: A | Discharge status: Home / Self Care | Payer: Self-pay | Attending: Family Medicine | Admitting: Family Medicine

## 2017-11-13 DIAGNOSIS — E782 Mixed hyperlipidemia: Secondary | ICD-10-CM | POA: Diagnosis present

## 2017-11-13 DIAGNOSIS — F17210 Nicotine dependence, cigarettes, uncomplicated: Secondary | ICD-10-CM | POA: Diagnosis present

## 2017-11-13 DIAGNOSIS — F17200 Nicotine dependence, unspecified, uncomplicated: Secondary | ICD-10-CM | POA: Diagnosis present

## 2017-11-13 DIAGNOSIS — Z955 Presence of coronary angioplasty implant and graft: Secondary | ICD-10-CM

## 2017-11-13 DIAGNOSIS — Z833 Family history of diabetes mellitus: Secondary | ICD-10-CM

## 2017-11-13 DIAGNOSIS — I25118 Atherosclerotic heart disease of native coronary artery with other forms of angina pectoris: Principal | ICD-10-CM | POA: Diagnosis present

## 2017-11-13 DIAGNOSIS — K219 Gastro-esophageal reflux disease without esophagitis: Secondary | ICD-10-CM | POA: Diagnosis present

## 2017-11-13 DIAGNOSIS — J45909 Unspecified asthma, uncomplicated: Secondary | ICD-10-CM | POA: Diagnosis present

## 2017-11-13 DIAGNOSIS — Z9049 Acquired absence of other specified parts of digestive tract: Secondary | ICD-10-CM

## 2017-11-13 DIAGNOSIS — R931 Abnormal findings on diagnostic imaging of heart and coronary circulation: Secondary | ICD-10-CM

## 2017-11-13 DIAGNOSIS — Z8249 Family history of ischemic heart disease and other diseases of the circulatory system: Secondary | ICD-10-CM

## 2017-11-13 DIAGNOSIS — N189 Chronic kidney disease, unspecified: Secondary | ICD-10-CM

## 2017-11-13 DIAGNOSIS — E119 Type 2 diabetes mellitus without complications: Secondary | ICD-10-CM

## 2017-11-13 DIAGNOSIS — Z87442 Personal history of urinary calculi: Secondary | ICD-10-CM

## 2017-11-13 DIAGNOSIS — I25119 Atherosclerotic heart disease of native coronary artery with unspecified angina pectoris: Secondary | ICD-10-CM | POA: Diagnosis present

## 2017-11-13 DIAGNOSIS — Z6833 Body mass index (BMI) 33.0-33.9, adult: Secondary | ICD-10-CM

## 2017-11-13 DIAGNOSIS — I2510 Atherosclerotic heart disease of native coronary artery without angina pectoris: Secondary | ICD-10-CM

## 2017-11-13 DIAGNOSIS — E669 Obesity, unspecified: Secondary | ICD-10-CM | POA: Diagnosis present

## 2017-11-13 DIAGNOSIS — Z9114 Patient's other noncompliance with medication regimen: Secondary | ICD-10-CM

## 2017-11-13 DIAGNOSIS — Z720 Tobacco use: Secondary | ICD-10-CM | POA: Diagnosis present

## 2017-11-13 DIAGNOSIS — E785 Hyperlipidemia, unspecified: Secondary | ICD-10-CM | POA: Diagnosis present

## 2017-11-13 DIAGNOSIS — R079 Chest pain, unspecified: Secondary | ICD-10-CM

## 2017-11-13 DIAGNOSIS — E1122 Type 2 diabetes mellitus with diabetic chronic kidney disease: Secondary | ICD-10-CM | POA: Diagnosis present

## 2017-11-13 DIAGNOSIS — Z9119 Patient's noncompliance with other medical treatment and regimen: Secondary | ICD-10-CM

## 2017-11-13 DIAGNOSIS — E1165 Type 2 diabetes mellitus with hyperglycemia: Secondary | ICD-10-CM | POA: Diagnosis present

## 2017-11-13 DIAGNOSIS — Z8349 Family history of other endocrine, nutritional and metabolic diseases: Secondary | ICD-10-CM

## 2017-11-13 DIAGNOSIS — I10 Essential (primary) hypertension: Secondary | ICD-10-CM | POA: Diagnosis present

## 2017-11-13 DIAGNOSIS — R072 Precordial pain: Secondary | ICD-10-CM

## 2017-11-13 DIAGNOSIS — E559 Vitamin D deficiency, unspecified: Secondary | ICD-10-CM | POA: Diagnosis present

## 2017-11-13 DIAGNOSIS — E039 Hypothyroidism, unspecified: Secondary | ICD-10-CM | POA: Diagnosis present

## 2017-11-13 DIAGNOSIS — E1159 Type 2 diabetes mellitus with other circulatory complications: Secondary | ICD-10-CM

## 2017-11-13 HISTORY — DX: Atherosclerotic heart disease of native coronary artery without angina pectoris: I25.10

## 2017-11-13 HISTORY — DX: Dyspnea, unspecified: R06.00

## 2017-11-13 HISTORY — DX: Chronic kidney disease, unspecified: N18.9

## 2017-11-13 LAB — RAPID URINE DRUG SCREEN, HOSP PERFORMED
Amphetamines: NOT DETECTED
Barbiturates: NOT DETECTED
Benzodiazepines: NOT DETECTED
COCAINE: NOT DETECTED
Opiates: NOT DETECTED
TETRAHYDROCANNABINOL: NOT DETECTED

## 2017-11-13 LAB — D-DIMER, QUANTITATIVE (NOT AT ARMC): D DIMER QUANT: 0.35 ug{FEU}/mL (ref 0.00–0.50)

## 2017-11-13 LAB — BASIC METABOLIC PANEL
ANION GAP: 8 (ref 5–15)
BUN: 18 mg/dL (ref 6–20)
CO2: 27 mmol/L (ref 22–32)
CREATININE: 1.55 mg/dL — AB (ref 0.61–1.24)
Calcium: 9.4 mg/dL (ref 8.9–10.3)
Chloride: 101 mmol/L (ref 98–111)
GFR, EST NON AFRICAN AMERICAN: 52 mL/min — AB (ref >60)
Glucose, Bld: 241 mg/dL — ABNORMAL HIGH (ref 70–99)
Potassium: 3.5 mmol/L (ref 3.5–5.1)
SODIUM: 136 mmol/L (ref 135–145)

## 2017-11-13 LAB — POCT I-STAT TROPONIN I: TROPONIN I, POC: 0 ng/mL (ref 0.00–0.08)

## 2017-11-13 LAB — GLUCOSE, CAPILLARY
GLUCOSE-CAPILLARY: 139 mg/dL — AB (ref 70–99)
Glucose-Capillary: 146 mg/dL — ABNORMAL HIGH (ref 70–99)

## 2017-11-13 LAB — CBC
HCT: 42.5 % (ref 39.0–52.0)
HEMOGLOBIN: 14.1 g/dL (ref 13.0–17.0)
MCH: 30.4 pg (ref 26.0–34.0)
MCHC: 33.2 g/dL (ref 30.0–36.0)
MCV: 91.6 fL (ref 78.0–100.0)
PLATELETS: 238 10*3/uL (ref 150–400)
RBC: 4.64 MIL/uL (ref 4.22–5.81)
RDW: 16.5 % — ABNORMAL HIGH (ref 11.5–15.5)
WBC: 12.8 10*3/uL — AB (ref 4.0–10.5)

## 2017-11-13 LAB — TSH: TSH: 84.91 u[IU]/mL — AB (ref 0.350–4.500)

## 2017-11-13 MED ORDER — NITROGLYCERIN 2 % TD OINT
0.5000 [in_us] | TOPICAL_OINTMENT | Freq: Four times a day (QID) | TRANSDERMAL | Status: DC
  Administered 2017-11-13 – 2017-11-16 (×9): 0.5 [in_us] via TOPICAL
  Filled 2017-11-13 (×2): qty 30
  Filled 2017-11-13: qty 1

## 2017-11-13 MED ORDER — INSULIN ASPART 100 UNIT/ML ~~LOC~~ SOLN
0.0000 [IU] | Freq: Every day | SUBCUTANEOUS | Status: DC
  Administered 2017-11-14: 2 [IU] via SUBCUTANEOUS

## 2017-11-13 MED ORDER — ASPIRIN 81 MG PO CHEW
324.0000 mg | CHEWABLE_TABLET | Freq: Once | ORAL | Status: AC
  Administered 2017-11-13: 324 mg via ORAL
  Filled 2017-11-13: qty 4

## 2017-11-13 MED ORDER — "NITROGLYCERIN NICU 2% OINTMENT ": TOPICAL_OINTMENT | Freq: Two times a day (BID) | TRANSDERMAL | Status: DC

## 2017-11-13 MED ORDER — INSULIN ASPART 100 UNIT/ML ~~LOC~~ SOLN
0.0000 [IU] | Freq: Three times a day (TID) | SUBCUTANEOUS | Status: DC
  Administered 2017-11-14: 3 [IU] via SUBCUTANEOUS
  Administered 2017-11-14: 5 [IU] via SUBCUTANEOUS
  Administered 2017-11-14 – 2017-11-15 (×2): 3 [IU] via SUBCUTANEOUS
  Administered 2017-11-15: 2 [IU] via SUBCUTANEOUS
  Administered 2017-11-15 – 2017-11-16 (×2): 3 [IU] via SUBCUTANEOUS

## 2017-11-13 MED ORDER — NITROGLYCERIN 0.4 MG SL SUBL
0.4000 mg | SUBLINGUAL_TABLET | Freq: Once | SUBLINGUAL | Status: AC
  Administered 2017-11-13: 0.4 mg via SUBLINGUAL
  Filled 2017-11-13: qty 1

## 2017-11-13 NOTE — ED Triage Notes (Signed)
Patient complains of chest pain that started 45 minutes when he went to work. States shortness of breath and heaviness in chest.

## 2017-11-13 NOTE — H&P (Signed)
History and Physical    Devin [name]:[id number] DOB: 1970-10-02 DOA: 11/13/2017  PCP: Patient, No Pcp Per , was previously followed by health dept  Patient coming from: work meeting  I have personally briefly reviewed patient's old medical records in [facility]  Chief Complaint: CP  HPI: Devin [name] is a 47 y.o. male with medical history significant of Htn, DM, HLP, tobacco abuse presents with CP. He was at a work meeting when he suddenly felt substernal Chest pressure. He had associated tingling in Left arm. After the meeting he walked a short distance to his car and he was diaphoretic and very SOB.  It did not take a very long before coming to the ED.  He was given aspirin and nitro with eventual resolution of his chest discomfort.  Patient stopped taking all of his medications many months ago.  He stated that all meds were making him nauseous.  Since stopping his meds than resolved. .  Also stopped taking his thyroid medication. Continues to  smoke cigarettes but has cut down the past 2 months. patient's chest x-ray, d-dimer and troponin were all negative.Ekg was non acute .  Patient had a coronary CT done in 2014 that showed LAD plaque.  He also left heart cath in [place]  Few yrs ago that showed a similar result.   ED Course: Case discussed with Dr long. He received ASA and SL nitro which relieved his pain mostly   Review of Systems: Positive for chest pain diaphoresis shortness of breath All others reviewed with patient  and are  negative unless otherwise stated  Past Medical History:  Diagnosis Date  . Asthma   . Chest pain   . GERD (gastroesophageal reflux disease)   . History of kidney stones   . History of rhabdomyolysis   . Hyperlipidemia   . Hypertension   . Hypothyroidism   . Kidney calculus 2014  . Pericardial effusion    Small, by dobutamine echocardiogram, 04/2006  . Tobacco abuse   . Type 2 diabetes mellitus without complications (HCC) 10/07/2013  .  Vitamin D deficiency     Past Surgical History:  Procedure Laterality Date  . 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 CHOLECYSTECTOMY N/A 01/27/2013   Procedure: LAPAROSCOPIC CHOLECYSTECTOMY;  Surgeon: Dalia Heading, MD;  Location: [facility];  Service: General;  Laterality: N/A;  . HERNIA REPAIR     As a child  . INSERTION OF MESH N/A 11/13/2012   Procedure: INSERTION OF MESH;  Surgeon: Dalia Heading, MD;  Location: [facility];  Service: General;  Laterality: N/A;  . KIDNEY STONE SURGERY    . PERCUTANEOUS NEPHROLITHOTRIPSY    . UMBILICAL HERNIA REPAIR N/A 11/13/2012   Procedure: UMBILICAL HERNIORRHAPHY;  Surgeon: Dalia Heading, MD;  Location: [facility];  Service: General;  Laterality: N/A;  . VARICOCELECTOMY       reports that he has been smoking cigarettes. He started smoking about 32 years ago. He has a 13.00 pack-year smoking history. He quit smokeless tobacco use about 30 years ago.  His smokeless tobacco use included snuff and chew. He reports that he does not drink alcohol or use drugs. works as Chartered certified accountant   Allergies  Allergen Reactions  . Bee Venom Swelling  . Crestor [Rosuvastatin] Other (See Comments)    rhabdomyolosis    Family History  Problem Relation Age of Onset  .  Coronary artery disease Father   . Hypertension Father   . Hyperlipidemia Father   . Diabetes Father   . Coronary artery disease Mother   . Diabetes Mother   . Hypertension Mother   . Hyperlipidemia Mother   . Coronary artery disease Maternal Grandfather        both grandfathers and several uncles  . Coronary artery disease Unknown   . Diabetes Sister        both sisters     Prior to Admission medications   Medication Sig Start Date End Date Taking? Authorizing Provider       [provider]       Gilda Crease, MD       Gilda Crease, MD       Gilda Crease, MD       [provider]       Gilda Crease, MD       Gilda Crease, MD  Physical Exam: Vitals:   11/13/17 1800 11/13/17 1830 11/13/17 1900 11/13/17 1930  BP: 124/66 133/85 (!) 140/95 140/88  Pulse: 67 63 70 71  Resp: 20 20 20 19   Temp:      TempSrc:      SpO2: 97% 100% 99% 97%  Weight:      Height:        Constitutional: NAD, calm, comfortable, very tall, obese guy  Vitals:   11/13/17 1800 11/13/17 1830 11/13/17 1900 11/13/17 1930  BP: 124/66 133/85 (!) 140/95 140/88  Pulse: 67 63 70 71  Resp: 20 20 20 19   Temp:      TempSrc:      SpO2: 97% 100% 99% 97%  Weight:      Height:       Eyes: PERRL, lids and conjunctivae normal ENMT: Mucous membranes are moist. Posterior pharynx clear of any exudate or lesions.Normal dentition.  Neck: normal, supple, no masses, no thyromegaly Respiratory: clear to auscultation bilaterally, no wheezing, no crackles. Normal respiratory effort. No accessory muscle use.  Cardiovascular: Regular rate and rhythm, no murmurs / rubs / gallops. No extremity edema. 2+ pedal pulses.  Abdomen: no tenderness, no masses palpated. No hepatosplenomegaly. Bowel sounds positive. obese Musculoskeletal: no clubbing / cyanosis. No joint deformity upper and lower extremities. Good ROM, no contractures. Normal muscle tone.  Skin: no rashes, lesions, ulcers. No induration Neurologic: CN 2-12 grossly intact. . Strength 5/5 in all 4.  Psychiatric: Normal judgment and insight. Alert and oriented x 3. Normal mood.    Labs on Admission: I have personally reviewed following labs and imaging studies  CBC: Recent Labs  Lab 11/13/17 1729  WBC 12.8*  HGB 14.1  HCT 42.5  MCV 91.6  PLT 238   Basic Metabolic Panel: Recent Labs  Lab 11/13/17 1729  NA 136  K 3.5  CL 101  CO2 27  GLUCOSE 241*  BUN 18  CREATININE 1.55*  CALCIUM 9.4   GFR: Estimated Creatinine Clearance: 93 mL/min (A) (by C-G formula based on SCr of 1.55 mg/dL (H)). Liver Function  Tests: No results for input(s): AST, ALT, ALKPHOS, BILITOT, PROT, ALBUMIN in the last 168 hours. No results for input(s): LIPASE, AMYLASE in the last 168 hours. No results for input(s): AMMONIA in the last 168 hours. Coagulation Profile: No results for input(s): INR, PROTIME in the last 168 hours. Cardiac Enzymes: No results for input(s): CKTOTAL, CKMB, CKMBINDEX, TROPONINI in the last 168 hours. BNP (last 3 results) No results for input(s):  PROBNP in the last 8760 hours. HbA1C: No results for input(s): HGBA1C in the last 72 hours. CBG: Recent Labs  Lab 11/13/17 1927  GLUCAP 139*   Lipid Profile: No results for input(s): CHOL, HDL, LDLCALC, TRIG, CHOLHDL, LDLDIRECT in the last 72 hours. Thyroid Function Tests: No results for input(s): TSH, T4TOTAL, FREET4, T3FREE, THYROIDAB in the last 72 hours. Anemia Panel: No results for input(s): VITAMINB12, FOLATE, FERRITIN, TIBC, IRON, RETICCTPCT in the last 72 hours. Urine analysis:    Component Value Date/Time   COLORURINE YELLOW 10/06/2013 0305   APPEARANCEUR CLEAR 10/06/2013 0305   LABSPEC 1.020 10/06/2013 0305   PHURINE 6.5 10/06/2013 0305   GLUCOSEU NEGATIVE 10/06/2013 0305   HGBUR MODERATE (A) 10/06/2013 0305   BILIRUBINUR NEGATIVE 10/06/2013 0305   KETONESUR NEGATIVE 10/06/2013 0305   PROTEINUR 30 (A) 10/06/2013 0305   UROBILINOGEN 0.2 10/06/2013 0305   NITRITE NEGATIVE 10/06/2013 0305   LEUKOCYTESUR NEGATIVE 10/06/2013 0305    Radiological Exams on Admission: Dg Chest 2 View  Result Date: 11/13/2017 CLINICAL DATA:  Chest pain, shortness of breath, chest heaviness EXAM: CHEST - 2 VIEW COMPARISON:  04/28/2017 FINDINGS: Normal heart size, mediastinal contours, and pulmonary vascularity. Lungs clear. No pleural effusion or pneumothorax. Bones unremarkable. IMPRESSION: Normal exam. Electronically Signed   By: Ulyses Southward M.D.   On: 11/13/2017 16:12    EKG: Independently reviewed. NSR, poor r wave progression    Assessment/Plan Principal Problem:   Chest pain Active Problems:   Tobacco abuse   Hypertension   Hyperlipidemia   Hypothyroidism   Type 2 diabetes mellitus without complications (HCC)  CKD  -Transfer to [facility] for cardiology eval. I spoke with Dr Gery Pray, cardiology.  Admit to telemetry for observation, cycle cardiac enzymes, aspirin, no stain for now since Hx myositis,  Nitropaste,  beta-blocker as tolerated, check fasting lipid panel. -Advised tobacco cessation. He has cut down the past 2 mo -Pt stopped all of his meds months ago. Check TSH restart appropriate dose in am  -Carb consistent diet, sliding scale insulin , check hemoglobin A1c Suspect chronic kidney disease creatinine 1.45 in the past currently 1.55. Check UA     DVT prophylaxis: lovenox  Code Status: Full Disposition Plan: dc home 2 days   Consults called: Cardiology on call Dr Gery Pray  Admission status: Obs tele     Synetta Fail MD [facility] Pager [phone]  If 7PM-7AM, please contact night-coverage [email or web address] Password [facility]  11/13/2017, 9:23 PM

## 2017-11-13 NOTE — ED Provider Notes (Signed)
Emergency Department Provider Note   I have reviewed the triage vital signs and the nursing notes.   HISTORY  Chief Complaint Chest Pain   HPI Devin [name] is a 47 y.o. male with PMH of asthma, GERD, HTN, HLD, and DM resents to the emergency department for evaluation of sudden onset central chest pressure radiating to the left arm with some subjective numbness and shortness of breath.  The patient states that approximately 45 minutes prior to ED presentation the symptoms began suddenly.  He states he was in a meeting and left but was feeling very short of breath and diaphoretic.  His similar chest pain in the past.  He states he did have a left heart catheterization in 2014 but no stents were placed at that time.  He does not follow with a cardiologist.  He states the pain is improved significantly but he still has some residual pressure.  Denies any fevers or chills.  No productive cough. No history of DVT/PE.   Past Medical History:  Diagnosis Date  . Asthma   . Chest pain   . GERD (gastroesophageal reflux disease)   . History of kidney stones   . History of rhabdomyolysis   . Hyperlipidemia   . Hypertension   . Hypothyroidism   . Kidney calculus 2014  . Pericardial effusion    Small, by dobutamine echocardiogram, 04/2006  . Tobacco abuse   . Type 2 diabetes mellitus without complications (HCC) 10/07/2013  . Vitamin D deficiency     Patient Active Problem List   Diagnosis Date Noted  . Hypothyroidism 10/07/2013  . Hyponatremia 10/07/2013  . Type 2 diabetes mellitus without complications (HCC) 10/07/2013  . Rhabdomyolysis 10/06/2013  . Acute renal failure (HCC) 10/06/2013  . Elevated AST (SGOT) 10/06/2013  . Elevated ALT measurement 10/06/2013  . Myalgia and myositis, unspecified 10/06/2013  . Chest pain   . Tobacco abuse   . Hypertension   . Hyperlipidemia   . Pericardial effusion   . History of rhabdomyolysis     Past Surgical History:  Procedure Laterality  Date  . 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 CHOLECYSTECTOMY N/A 01/27/2013   Procedure: LAPAROSCOPIC CHOLECYSTECTOMY;  Surgeon: Dalia Heading, MD;  Location: [facility];  Service: General;  Laterality: N/A;  . HERNIA REPAIR     As a child  . INSERTION OF MESH N/A 11/13/2012   Procedure: INSERTION OF MESH;  Surgeon: Dalia Heading, MD;  Location: [facility];  Service: General;  Laterality: N/A;  . KIDNEY STONE SURGERY    . PERCUTANEOUS NEPHROLITHOTRIPSY    . UMBILICAL HERNIA REPAIR N/A 11/13/2012   Procedure: UMBILICAL HERNIORRHAPHY;  Surgeon: Dalia Heading, MD;  Location: [facility];  Service: General;  Laterality: N/A;  . VARICOCELECTOMY     Allergies Bee venom and Crestor [rosuvastatin]  Family History  Problem Relation Age of Onset  . Coronary artery disease Father   . Hypertension Father   . Hyperlipidemia Father   . Diabetes Father   . Coronary artery disease Mother   . Diabetes Mother   . Hypertension Mother   . Hyperlipidemia Mother   . Coronary artery disease Maternal Grandfather        both grandfathers and several uncles  . Coronary artery disease Unknown   . Diabetes Sister        both sisters    Social History Social History  Tobacco Use  . Smoking status: Current Every Day Smoker    Packs/day: 0.50    Years: 26.00    Pack years: 13.00    Types: Cigarettes    Start date: 03/13/1985  . Smokeless tobacco: Former Neurosurgeon    Types: Snuff, Chew    Quit date: 03/14/1987  . Tobacco comment: started smoking at age 21. chewed and dipped for 2 years  Substance Use Topics  . Alcohol use: No  . Drug use: No    Review of Systems  Constitutional: No fever/chills Eyes: No visual changes. ENT: No sore throat. Cardiovascular: Positive chest pain. Respiratory: Positive shortness of breath. Gastrointestinal: No abdominal pain.  No nausea, no vomiting.  No diarrhea.  No  constipation. Genitourinary: Negative for dysuria. Musculoskeletal: Negative for back pain. Skin: Negative for rash. Neurological: Negative for headaches, focal weakness or numbness.  10-point ROS otherwise negative.  ____________________________________________   PHYSICAL EXAM:  VITAL SIGNS: ED Triage Vitals  Enc Vitals Group     BP 11/13/17 1537 (!) 137/94     Pulse Rate 11/13/17 1537 (!) 104     Resp 11/13/17 1537 20     Temp 11/13/17 1537 98.2 F (36.8 C)     Temp Source 11/13/17 1537 Oral     SpO2 11/13/17 1537 98 %     Weight 11/13/17 1536 300 lb (136.1 kg)     Height 11/13/17 1536 6\' 6"  (1.981 m)     Pain Score 11/13/17 1538 9   Constitutional: Alert and oriented. Well appearing and in no acute distress. Eyes: Conjunctivae are normal.  Head: Atraumatic. Nose: No congestion/rhinnorhea. Mouth/Throat: Mucous membranes are moist.   Neck: No stridor.   Cardiovascular: Tachycardia. Good peripheral circulation. Grossly normal heart sounds.   Respiratory: Normal respiratory effort.  No retractions. Lungs CTAB. Gastrointestinal: Soft and nontender. No distention.  Musculoskeletal: No lower extremity tenderness nor edema. No gross deformities of extremities. Neurologic:  Normal speech and language. No gross focal neurologic deficits are appreciated.  Skin:  Skin is warm, dry and intact. No rash noted.  ____________________________________________   LABS (all labs ordered are listed, but only abnormal results are displayed)  Labs Reviewed  BASIC METABOLIC PANEL - Abnormal; Notable for the following components:      Result Value   Glucose, Bld 241 (*)    Creatinine, Ser 1.55 (*)    GFR calc non Af Amer 52 (*)    All other components within normal limits  CBC - Abnormal; Notable for the following components:   WBC 12.8 (*)    RDW 16.5 (*)    All other components within normal limits  GLUCOSE, CAPILLARY - Abnormal; Notable for the following components:    Glucose-Capillary 139 (*)    All other components within normal limits  D-DIMER, QUANTITATIVE (NOT AT [facility])  RAPID URINE DRUG SCREEN, HOSP PERFORMED  HEMOGLOBIN A1C  I-STAT TROPONIN, ED  POCT I-STAT TROPONIN I   ____________________________________________  EKG   EKG Interpretation  Date/Time:  Tuesday November 13 2017 15:40:44 EDT Ventricular Rate:  94 PR Interval:  148 QRS Duration: 102 QT Interval:  348 QTC Calculation: 435 R Axis:   90 Text Interpretation:  Normal sinus rhythm Rightward axis Possible Anterior infarct , age undetermined Abnormal ECG No STEMI.  Confirmed by Alona Bene [phone]) on 11/13/2017 3:45:18 PM       ____________________________________________  RADIOLOGY  Dg Chest 2 View  Result Date: 11/13/2017 CLINICAL DATA:  Chest pain, shortness of breath, chest heaviness  EXAM: CHEST - 2 VIEW COMPARISON:  04/28/2017 FINDINGS: Normal heart size, mediastinal contours, and pulmonary vascularity. Lungs clear. No pleural effusion or pneumothorax. Bones unremarkable. IMPRESSION: Normal exam. Electronically Signed   By: Ulyses Southward M.D.   On: 11/13/2017 16:12    ____________________________________________   PROCEDURES  Procedure(s) performed:   Procedures  None ____________________________________________   INITIAL IMPRESSION / ASSESSMENT AND PLAN / ED COURSE  Pertinent labs & imaging results that were available during my care of the patient were reviewed by me and considered in my medical decision making (see chart for details).  Patient presents to the emergency department for evaluation of sudden onset chest pressure with diaphoresis, left arm symptoms.  Patient with history of coronary artery disease with left heart cath in 2014 which showed 30 to 40% stenosis in 2 areas of the LAD.  These were not stented.  No other areas of concern during that study.  Patient has several risk factors for acute coronary syndrome. Also plan to consider PE with D-dimer.  ASA and Nitro given.   Labs and imaging reviewed. Patient CP resolved. Plan for admit for ACS evaluation.   Discussed patient's case with Hospitalist, Dr. Robb Matar to request admission. Patient and family (if present) updated with plan. Care transferred to Hospitalist service.  I reviewed all nursing notes, vitals, pertinent old records, EKGs, labs, imaging (as available).  ____________________________________________  FINAL CLINICAL IMPRESSION(S) / ED DIAGNOSES  Final diagnoses:  Precordial chest pain     MEDICATIONS GIVEN DURING THIS VISIT:  Medications  insulin aspart (novoLOG) injection 0-15 Units (has no administration in time range)  insulin aspart (novoLOG) injection 0-5 Units (0 Units Subcutaneous Not Given 11/13/17 1944)  aspirin chewable tablet 324 mg (324 mg Oral Given 11/13/17 1734)  nitroGLYCERIN (NITROSTAT) SL tablet 0.4 mg (0.4 mg Sublingual Given 11/13/17 1735)    Note:  This document was prepared using Dragon voice recognition software and may include unintentional dictation errors.  Alona Bene, MD Emergency Medicine    Long, Arlyss Repress, MD 11/13/17 2013

## 2017-11-14 ENCOUNTER — Other Ambulatory Visit: Payer: Self-pay

## 2017-11-14 ENCOUNTER — Inpatient Hospital Stay (HOSPITAL_COMMUNITY): Payer: Self-pay

## 2017-11-14 ENCOUNTER — Encounter (HOSPITAL_COMMUNITY): Payer: Self-pay | Admitting: Physician Assistant

## 2017-11-14 ENCOUNTER — Observation Stay (HOSPITAL_COMMUNITY): Payer: Self-pay

## 2017-11-14 DIAGNOSIS — I2510 Atherosclerotic heart disease of native coronary artery without angina pectoris: Secondary | ICD-10-CM

## 2017-11-14 DIAGNOSIS — R079 Chest pain, unspecified: Secondary | ICD-10-CM

## 2017-11-14 DIAGNOSIS — E7800 Pure hypercholesterolemia, unspecified: Secondary | ICD-10-CM

## 2017-11-14 DIAGNOSIS — I25119 Atherosclerotic heart disease of native coronary artery with unspecified angina pectoris: Secondary | ICD-10-CM | POA: Diagnosis present

## 2017-11-14 DIAGNOSIS — I10 Essential (primary) hypertension: Secondary | ICD-10-CM

## 2017-11-14 LAB — TROPONIN I: Troponin I: <0.03 ng/mL (ref <0.03)

## 2017-11-14 LAB — URINALYSIS, ROUTINE W REFLEX MICROSCOPIC
Bilirubin Urine: NEGATIVE
GLUCOSE, UA: 50 mg/dL — AB
HGB URINE DIPSTICK: NEGATIVE
KETONES UR: NEGATIVE mg/dL
LEUKOCYTES UA: NEGATIVE
NITRITE: NEGATIVE
PROTEIN: 30 mg/dL — AB
Specific Gravity, Urine: 1.026 (ref 1.005–1.030)
WBC, UA: >50 WBC/hpf — ABNORMAL HIGH (ref 0–5)
pH: 6 (ref 5.0–8.0)

## 2017-11-14 LAB — CBC
HCT: 42.4 % (ref 39.0–52.0)
Hemoglobin: 13.8 g/dL (ref 13.0–17.0)
MCH: 29.8 pg (ref 26.0–34.0)
MCHC: 32.5 g/dL (ref 30.0–36.0)
MCV: 91.6 fL (ref 78.0–100.0)
PLATELETS: 219 10*3/uL (ref 150–400)
RBC: 4.63 MIL/uL (ref 4.22–5.81)
RDW: 16.2 % — AB (ref 11.5–15.5)
WBC: 13.1 10*3/uL — ABNORMAL HIGH (ref 4.0–10.5)

## 2017-11-14 LAB — GLUCOSE, CAPILLARY
GLUCOSE-CAPILLARY: 233 mg/dL — AB (ref 70–99)
GLUCOSE-CAPILLARY: 204 mg/dL — AB (ref 70–99)
GLUCOSE-CAPILLARY: 189 mg/dL — AB (ref 70–99)
Glucose-Capillary: 195 mg/dL — ABNORMAL HIGH (ref 70–99)
Glucose-Capillary: 178 mg/dL — ABNORMAL HIGH (ref 70–99)

## 2017-11-14 LAB — CREATININE, SERUM
Creatinine, Ser: 1.41 mg/dL — ABNORMAL HIGH (ref 0.61–1.24)
GFR calc Af Amer: >60 mL/min (ref >60)
GFR calc non Af Amer: 59 mL/min — ABNORMAL LOW (ref >60)

## 2017-11-14 LAB — HIV ANTIBODY (ROUTINE TESTING W REFLEX): HIV Screen 4th Generation wRfx: NONREACTIVE

## 2017-11-14 LAB — HEMOGLOBIN A1C
Hgb A1c MFr Bld: 9.1 % — ABNORMAL HIGH (ref 4.8–5.6)
Mean Plasma Glucose: 214.47 mg/dL

## 2017-11-14 MED ORDER — ACETAMINOPHEN 325 MG PO TABS: 650.0000 mg | ORAL_TABLET | ORAL | Status: DC | PRN

## 2017-11-14 MED ORDER — ASPIRIN EC 81 MG PO TBEC
81.0000 mg | DELAYED_RELEASE_TABLET | Freq: Every day | ORAL | Status: DC
  Administered 2017-11-15 – 2017-11-16 (×2): 81 mg via ORAL
  Filled 2017-11-14 (×2): qty 1

## 2017-11-14 MED ORDER — ONDANSETRON HCL 4 MG/2ML IJ SOLN: 4.0000 mg | Freq: Four times a day (QID) | INTRAMUSCULAR | Status: DC | PRN

## 2017-11-14 MED ORDER — NITROGLYCERIN 0.4 MG SL SUBL
SUBLINGUAL_TABLET | SUBLINGUAL | Status: AC
  Filled 2017-11-14: qty 1

## 2017-11-14 MED ORDER — AMLODIPINE BESYLATE 5 MG PO TABS
2.5000 mg | ORAL_TABLET | Freq: Every day | ORAL | Status: DC
  Administered 2017-11-14 – 2017-11-16 (×3): 2.5 mg via ORAL
  Filled 2017-11-14 (×3): qty 1

## 2017-11-14 MED ORDER — MORPHINE SULFATE (PF) 2 MG/ML IV SOLN: 2.0000 mg | INTRAVENOUS | Status: DC | PRN

## 2017-11-14 MED ORDER — FAMOTIDINE 20 MG PO TABS
20.0000 mg | ORAL_TABLET | Freq: Two times a day (BID) | ORAL | Status: DC
  Administered 2017-11-14 – 2017-11-15 (×5): 20 mg via ORAL
  Filled 2017-11-14 (×5): qty 1

## 2017-11-14 MED ORDER — NITROGLYCERIN 0.4 MG SL SUBL: 0.4000 mg | SUBLINGUAL_TABLET | SUBLINGUAL | Status: DC | PRN

## 2017-11-14 MED ORDER — IOPAMIDOL (ISOVUE-370) INJECTION 76%
100.0000 mL | Freq: Once | INTRAVENOUS | Status: AC | PRN
  Administered 2017-11-14: 100 mL via INTRAVENOUS

## 2017-11-14 MED ORDER — ENOXAPARIN SODIUM 40 MG/0.4ML ~~LOC~~ SOLN
40.0000 mg | SUBCUTANEOUS | Status: DC
  Administered 2017-11-14 – 2017-11-16 (×3): 40 mg via SUBCUTANEOUS
  Filled 2017-11-14 (×3): qty 0.4

## 2017-11-14 MED ORDER — GI COCKTAIL ~~LOC~~: 30.0000 mL | Freq: Four times a day (QID) | ORAL | Status: DC | PRN

## 2017-11-14 MED ORDER — ASPIRIN EC 325 MG PO TBEC
325.0000 mg | DELAYED_RELEASE_TABLET | Freq: Every day | ORAL | Status: DC
  Administered 2017-11-14: 325 mg via ORAL
  Filled 2017-11-14: qty 1

## 2017-11-14 MED ORDER — AMLODIPINE 1 MG/ML ORAL SUSPENSION: 2.5000 mg | Freq: Every day | ORAL | Status: DC

## 2017-11-14 MED ORDER — METOPROLOL TARTRATE 25 MG PO TABS
25.0000 mg | ORAL_TABLET | Freq: Two times a day (BID) | ORAL | Status: DC
  Administered 2017-11-14: 25 mg via ORAL
  Filled 2017-11-14 (×2): qty 1

## 2017-11-14 MED ORDER — LEVOTHYROXINE SODIUM 25 MCG PO TABS
125.0000 ug | ORAL_TABLET | Freq: Every day | ORAL | Status: DC
  Administered 2017-11-14 – 2017-11-16 (×3): 125 ug via ORAL
  Filled 2017-11-14 (×3): qty 1

## 2017-11-14 NOTE — Plan of Care (Signed)
  Problem: Activity: Goal: Risk for activity intolerance will decrease Outcome: Progressing   Problem: Pain Managment: Goal: General experience of comfort will improve Outcome: Completed/Met   Problem: Safety: Goal: Ability to remain free from injury will improve Outcome: Completed/Met

## 2017-11-14 NOTE — Consult Note (Signed)
WOC Nurse wound consult note Reason for Consult: DFU.  No wound noted. Wound type: N/A Consult order discontinued.   WOC nursing team will not follow, but will remain available to this patient, the nursing and medical teams.  Please re-consult if needed. Thanks, Ladona Mow, MSN, RN, GNP, [place]  Pager# [phone]

## 2017-11-14 NOTE — Consult Note (Addendum)
Cardiology Consultation:   Patient ID: Devin [name]; [id number]; 08-Jul-1970   Admit date: 11/13/2017 Date of Consult: 11/14/2017  Primary Care Provider: Patient, No Pcp Per Primary Cardiologist: Dr Percival Spanish, 2014 Primary Electrophysiologist:  n/a   Patient Profile:   Devin [name] is a 47 y.o. male with a hx of minimal CAD at cath 2014, HTN, HLD, DM, tob use, GERD, hypothyroid, rhabdo, who is being seen today for the evaluation of chest pain at the request of Dr Bonner Puna.  History of Present Illness:   Mr. Ben was evaluated for CP in 2014. Due to recurrent episodes, abnl MV and abnl cardiac CT>>cath w/ non-obs dz. No cards f/u since 2014.   He has no insurance, was getting general care at the health dept, but rx made him sick, so quit going. Has been trying to eat healthy, eats deer meat. He takes OTC ranitidine x 2 qhs prn, GERD has not been bothering him. Has not taken Synthroid or other meds in at least 6 months.   He has been to [facility] 7-8 times for chest pain in the last couple of years. Same as 2014 pain. No clear diagnosis, he was treated for GERD, but did not feel that helped.   He gets chest pain 3-4 x a year. All episodes are similar. Chest pressure in the middle of his chest, +SOB, sometime nausea, occasional vomiting, + diaphoresis and chills. Feels weak and gets orthostatic light-headed feeling. No association w/ exertion. No aggravating sx. Has tried antacids, milk or water. Laying down, sx will ease off gradually but worsen when he stands up. Has had L arm numbness 2-3 x, not every time. Gets HA almost every time.   The CP comes first, but he will notice his heart race with exertion during the episodes. Does not notice that except during the episodes.   Has never checked his BP or ECG during the worst of the episodes. Always has to wait before being seen, sx ease off before he gets care. Has never been told that his BP or HR was abnormal during the episodes. However, he  has never been seen quickly when he went to [place].  Episode yesterday was worse than usual, 9/10. Had L arm numbness. He got very weak walking to his truck. Went to [facility], got ASA and SL NTG x1. Sx gradually resolved and have not returned. This episode was worse than usual, otherwise, fits the pattern.    Past Medical History:  Diagnosis Date  . Asthma   . Chest pain   . GERD (gastroesophageal reflux disease)   . History of kidney stones   . History of rhabdomyolysis   . Hyperlipidemia   . Hypertension   . Hypothyroidism   . Kidney calculus 2014  . Pericardial effusion    Small, by dobutamine echocardiogram, 04/2006  . Tobacco abuse   . Type 2 diabetes mellitus without complications ([place]) 3/81/7711  . Vitamin D deficiency     Past Surgical History:  Procedure Laterality Date  . 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 CHOLECYSTECTOMY N/A 01/27/2013   Procedure: LAPAROSCOPIC CHOLECYSTECTOMY;  Surgeon: Jamesetta So, MD;  Location: [facility];  Service: General;  Laterality: N/A;  . HERNIA REPAIR     As a child  . INSERTION OF MESH N/A 11/13/2012   Procedure: INSERTION OF MESH;  Surgeon: Jamesetta So, MD;  Location: [facility];  Service: General;  Laterality: N/A;  . KIDNEY STONE SURGERY    . PERCUTANEOUS NEPHROLITHOTRIPSY    . UMBILICAL HERNIA REPAIR N/A 11/13/2012   Procedure: UMBILICAL HERNIORRHAPHY;  Surgeon: Jamesetta So, MD;  Location: [facility];  Service: General;  Laterality: N/A;  . VARICOCELECTOMY       Prior to Admission medications: has not been on rx for at least 6 months.  Takes ranitidine prn.   Medication Sig Start Date End Date Taking? Authorizing Provider  amoxicillin (AMOXIL) 500 MG capsule Take 500 mg by mouth 3 (three) times daily. 7 day course starting on 06/27/2017 06/27/17   [provider]  Blood Glucose Monitoring Suppl w/Device KIT Dispense based on patient and  insurance preference. Check blood sugar daily before breakfast (ICD9 250.0) 04/29/17   Pollina, Gwenyth Allegra, MD  gemfibrozil (LOPID) 600 MG tablet Take 1 tablet (600 mg total) by mouth 2 (two) times daily. Patient not taking: Reported on 11/13/2017 04/29/17   Orpah Greek, MD  glipiZIDE (GLUCOTROL) 10 MG tablet Take 1 tablet (10 mg total) by mouth 2 (two) times daily. Patient not taking: Reported on 11/13/2017 04/29/17   Orpah Greek, MD  insulin NPH-regular Human (NOVOLIN 70/30) (70-30) 100 UNIT/ML injection Inject 15 Units into the skin 2 (two) times daily as needed.    [provider]  levothyroxine (SYNTHROID, LEVOTHROID) 125 MCG tablet Take 1 tablet (125 mcg total) by mouth daily before breakfast. Patient not taking: Reported on 11/13/2017 04/29/17   Orpah Greek, MD  metFORMIN (GLUCOPHAGE) 1000 MG tablet Take 1 tablet (1,000 mg total) by mouth 2 (two) times daily. Patient not taking: Reported on 11/13/2017 04/29/17   Orpah Greek, MD  nitroGLYCERIN (NITROSTAT) 0.4 MG SL tablet Place 0.4 mg under the tongue every 5 (five) minutes as needed for chest pain.    [provider]  ranitidine (ZANTAC) 150 MG tablet Take 150-300 mg by mouth daily.    [provider]    Inpatient Medications: Scheduled Meds: . aspirin EC  325 mg Oral Daily  . enoxaparin (LOVENOX) injection  40 mg Subcutaneous Q24H  . famotidine  20 mg Oral BID  . insulin aspart  0-15 Units Subcutaneous TID WC  . insulin aspart  0-5 Units Subcutaneous QHS  . levothyroxine  125 mcg Oral QAC breakfast  . metoprolol tartrate  25 mg Oral BID  . nitroGLYCERIN  0.5 inch Topical Q6H   Continuous Infusions:  PRN Meds: acetaminophen, gi cocktail, morphine injection, nitroGLYCERIN, ondansetron (ZOFRAN) IV  Allergies:    Allergies  Allergen Reactions  . Bee Venom Swelling  . Crestor [Rosuvastatin] Other (See Comments)    rhabdomyolosis    Social History:   Social History     Socioeconomic History  . Marital status: Divorced    Spouse name: Not on file  . Number of children: Not on file  . Years of education: Not on file  . Highest education level: Not on file  Occupational History  . Occupation: MAINTENANCE TECHNICIAN    Comment: [employer] IN [employer] [place]  Social Needs  . Financial resource strain: Not on file  . Food insecurity:    Worry: Not on file    Inability: Not on file  . Transportation needs:    Medical: Not on file    Non-medical: Not on file  Tobacco Use  . Smoking status: Current Every Day Smoker    Packs/day: 0.50    Years: 26.00    Pack years:  13.00    Types: Cigarettes    Start date: 03/13/1985  . Smokeless tobacco: Former Systems developer    Types: Snuff, Chew    Quit date: 03/14/1987  . Tobacco comment: started smoking at age 55. chewed and dipped for 2 years  Substance and Sexual Activity  . Alcohol use: No  . Drug use: No  . Sexual activity: Not on file  Lifestyle  . Physical activity:    Days per week: Not on file    Minutes per session: Not on file  . Stress: Not on file  Relationships  . Social connections:    Talks on phone: Not on file    Gets together: Not on file    Attends religious service: Not on file    Active member of club or organization: Not on file    Attends meetings of clubs or organizations: Not on file    Relationship status: Not on file  . Intimate partner violence:    Fear of current or ex partner: Not on file    Emotionally abused: Not on file    Physically abused: Not on file    Forced sexual activity: Not on file  Other Topics Concern  . Not on file  Social History Narrative   Eats fast food daily   No exercise outside of work    Family History:   Family History  Problem Relation Age of Onset  . Coronary artery disease Father   . Hypertension Father   . Hyperlipidemia Father   . Diabetes Father   . Coronary artery disease Mother   . Diabetes Mother   . Hypertension Mother   .  Hyperlipidemia Mother   . Coronary artery disease Maternal Grandfather        both grandfathers and several uncles  . Coronary artery disease Unknown   . Diabetes Sister        both sisters   Family Status:  Family Status  Relation Name Status  . Father  Deceased at age 82       after 22rd heart attack  . Mother  Deceased  . Sister  Alive       Has 2 sisters  . MGF  (Not Specified)  . Unknown  (Not Specified)  . Sister  (Not Specified)    ROS:  Please see the history of present illness.  All other ROS reviewed and negative.     Physical Exam/Data:   Vitals:   11/14/17 0638 11/14/17 0708 11/14/17 0749 11/14/17 1015  BP: (!) 91/56  113/68 106/63  Pulse: (!) 49  (!) 55 (!) 53  Resp: 18  20   Temp: 97.7 F (36.5 C)  97.9 F (36.6 C)   TempSrc: Oral  Oral   SpO2: 100%  98% 95%  Weight:  131 kg    Height:        Intake/Output Summary (Last 24 hours) at 11/14/2017 1139 Last data filed at 11/14/2017 1018 Gross per 24 hour  Intake 700 ml  Output -  Net 700 ml   Filed Weights   11/14/17 0103 11/14/17 0317 11/14/17 0708  Weight: 133.5 kg (!) 289.5 kg 131 kg   Body mass index is 33.39 kg/m.  General:  Well nourished, well developed, in no acute distress HEENT: normal Lymph: no adenopathy Neck: no JVD seen, difficult to assess 2nd body habitus Endocrine:  No thryomegaly Vascular: No carotid bruits; 4/4 extremity pulses 2+, without bruits  Cardiac:  normal S1, S2; RRR; no  murmur  Lungs:  clear to auscultation bilaterally, no wheezing, rhonchi, few scattered rales  Abd: soft, nontender, no hepatomegaly  Ext: no edema Musculoskeletal:  No deformities, BUE and BLE strength normal and equal Skin: warm and dry  Neuro:  CNs 2-12 intact, no focal abnormalities noted Psych:  Normal affect   EKG:  The EKG was personally reviewed and demonstrates:  Sinus brady, HR 51, no acute ischemic changes Telemetry:  Telemetry was personally reviewed and demonstrates:  SR, sinus brady  overnight  Relevant CV Studies:  ECHO: 08/19/2012 June 9: EF 50-55%; question inferobasal/inferoseptal HK; mild MR  LEXISCAN CARDIOLITE: 08/23/2012 - Lexiscan Cardiolite, June 13: Intermediate risk study, namely based on calculated EF (EF 37%); physiologically inconsistent reversible basal anterior defect, also fixed inferoseptal defect at apex  Subsequently referred by [facility]  This showed extensive LAD calcification with 50% or greater proximal and mid vessel disease.    CARDIAC CT: 10/03/2012 Ct Coronary Morp W/cta Cor W/score W/ca W/cm &/or Wo/cm  10/03/2012   OVER-READ INTERPRETATION - CT CHEST  The following report is an over-read performed by radiologist Dr. Etheleen Mayhew, M.D. of [facility], [place] on 10/03/2012 14:57:34.  This over-read does not include interpretation of cardiac or coronary anatomy or pathology.  The CTA interpretation by the cardiologist is attached.  Comparison:  No priors.  Findings: There are some patchy linear opacities throughout the lungs bilaterally, which may reflect areas of mild subsegmental atelectasis and/or scarring.  Within the visualized thorax there is no acute consolidative airspace disease, no suspicious-appearing pulmonary nodules or masses, and no pleural effusions.  No pneumothorax.  No mediastinal or hilar lymphadenopathy within the visualized thorax.  Visualized esophagus is appearance.  Visualized portions of the upper abdomen are unremarkable. There are no aggressive appearing lytic or blastic lesions noted in the visualized portions of the skeleton.  IMPRESSION: 1.  No significant incidental noncardiac findings noted.  Cardiac CT:  Indication: Atypical chest pain with abnormal myovue  Protocol: The patient was scanned on a Philips 256 scanner. Gantry rotation speed was 270 msec.  Collimation was .69m  the patient received 5 mg of iv lopressor and SL nitro Average HR during the scan was 57 bpm.  A 120 kV prospective scan with 5%  padding was triggered in the descending thoracic aorta at 111 HU's  A 3D data set at 73,78, and 83 % of the R-R interval were analyzed on a dedicated T[employer] work station using MIP, MPR and VRT modes. The patient received 80cc of contrast  Findings:  No significant findings on lung and soft tissue windows  Calcium Score: 65 with multiple foci in the proximal and mid LAD  Coronary Arteries: Left dominant with no anomalies  LM- normal  LAD- Less than 50% soft plaque in the ostial segment. 50% or less mixed plaque in the proximal and mid LAD        D1: small without significant disease  Circumflex: Large and left dominan no significant disease        OM1: small and normal        OM2: large and normal        PDA: normal        PLA: two large branches and normal  RCA: small non dominant and normal  Impression:     1)    Calcium score 65 all in the LAD 92nd percentile for age and       sex matched controls  2)    Possibly hemodynamically significant soft and mixed plaque in the proximal and mid LAD. Initiation of statins and smoking cessation important Will discuss the possible need for heart cath with Dr Percival Spanish given abnormal myovue study already  Jenkins Rouge MD [place] Report Authenticated By: Vinnie Langton, M.D.   CATH: 10/09/2012 Coronary angiography: Coronary dominance: right  Left mainstem: normal  Left anterior descending (LAD): Calcified in proximal and mid section.  30% proximal stenosis 40% tubular mid stenosis after first septal             D1: small and diffusely diseased             D2: small and normal             D3: small and normal  Left circumflex (LCx): Large left dominant and normal             OM1:  30% proximal             OM2: normal  PDA/PLA normal  Right coronary artery (RCA):  Small non dominant and diffusely diseased   Left ventriculography: Left ventricular systolic function is normal, LVEF is estimated at 55-65%, there is no significant mitral  regurgitation   Final Conclusions:   LAD disease is not flow limiting by angiogram  Recommendations:  CAD  Risk factor modification ASA statin and stop smoking  F/U Dr Percival Spanish in [place]  Laboratory Data:  Chemistry Recent Labs  Lab 11/13/17 1729 11/14/17 0107  NA 136  --   K 3.5  --   CL 101  --   CO2 27  --   GLUCOSE 241*  --   BUN 18  --   CREATININE 1.55* 1.41*  CALCIUM 9.4  --   GFRNONAA 52* 59*  GFRAA >60 >60  ANIONGAP 8  --     Lab Results  Component Value Date   ALT 62 (H) 10/07/2013   AST 80 (H) 10/07/2013   ALKPHOS 78 10/07/2013   BILITOT 0.4 10/07/2013   Hematology Recent Labs  Lab 11/13/17 1729 11/14/17 0107  WBC 12.8* 13.1*  RBC 4.64 4.63  HGB 14.1 13.8  HCT 42.5 42.4  MCV 91.6 91.6  MCH 30.4 29.8  MCHC 33.2 32.5  RDW 16.5* 16.2*  PLT 238 219   Cardiac Enzymes Recent Labs  Lab 11/14/17 0107  TROPONINI <0.03    Recent Labs  Lab 11/13/17 1726  TROPIPOC 0.00    DDimer  Recent Labs  Lab 11/13/17 1729  DDIMER 0.35   TSH:  Lab Results  Component Value Date   TSH 84.910 (H) 11/13/2017   Lipids:No results found for: CHOL, HDL, LDLCALC, LDLDIRECT, TRIG, CHOLHDL HgbA1c: Lab Results  Component Value Date   HGBA1C 7.0 (H) 10/06/2013    Radiology/Studies:  Dg Chest 2 View  Result Date: 11/13/2017 CLINICAL DATA:  Chest pain, shortness of breath, chest heaviness EXAM: CHEST - 2 VIEW COMPARISON:  04/28/2017 FINDINGS: Normal heart size, mediastinal contours, and pulmonary vascularity. Lungs clear. No pleural effusion or pneumothorax. Bones unremarkable. IMPRESSION: Normal exam. Electronically Signed   By: Lavonia Dana M.D.   On: 11/13/2017 16:12    Assessment and Plan:   Principal Problem: 1.  Chest pain -Although he does not exercise, his job is strenuous at times. -He has no history of consistent exertional symptoms. -The symptoms have been occurring 3 or 4 times a year for at least the last 5 years. -However, the episode yesterday  was at a higher intensity than usual.  Also, the left arm numbness does not occur with every episode.   - His initial ECG was sinus rhythm, heart rate 94, no acute ischemic changes.  His initial blood pressure was 137/94, heart rate 104. - The symptoms occur so rarely, I wonder about coronary spasm, but have no way to prove this.  - He was bradycardic overnight, he got metoprolol 25 mg p.o. X 1, it was held this a.m.  Would discontinue.     - Consider arrhythmia as a cause also, consider loop recorder      - add amlodipine for blood pressure control and to help with possible spasm.     -Repeat cardiac CT to evaluate for CAD, Myoview is problematic with a TSH that high      Otherwise, per IM Active Problems:   Tobacco abuse   Hypertension   Hyperlipidemia   Hypothyroidism   Type 2 diabetes mellitus without complications (HCC)   CKD (chronic kidney disease)     For questions or updates, please contact [facility] Please consult [email or web address] for contact info under Cardiology/STEMI.   SignedRosaria Ferries, PA-C  11/14/2017 11:39 AM

## 2017-11-14 NOTE — Progress Notes (Signed)
To the best of my knowledge, documentation by I Eldridge Dace nursing student, is correct.

## 2017-11-14 NOTE — Progress Notes (Signed)
Inpatient Diabetes Program Recommendations  AACE/ADA: New Consensus Statement on Inpatient Glycemic Control (2015)  Target Ranges:  Prepandial:   less than 140 mg/dL      Peak postprandial:   less than 180 mg/dL (1-2 hours)      Critically ill patients:  140 - 180 mg/dL   Lab Results  Component Value Date   GLUCAP 204 (H) 11/14/2017   HGBA1C 9.1 (H) 11/13/2017    Review of Glycemic Control Results for QAMAR, BRAGA (MRN [id number]) as of 11/14/2017 13:29  Ref. Range 11/13/2017 22:06 11/14/2017 01:32 11/14/2017 07:55 11/14/2017 11:24  Glucose-Capillary Latest Ref Range: 70 - 99 mg/dL 563 (H) 149 (H) 702 (H) 204 (H)   Diabetes history: Type 2 DM Outpatient Diabetes medications: Novolin 70/30 15 units, Glipizide 10 mg BID, Metformin 1000 mg BID Current orders for Inpatient glycemic control: Novolog 0-15 units TID, Novolog 0-5 units QHS  Inpatient Diabetes Program Recommendations:    Consider adding back a portion of basal insulin: Lantus 14 units QD.  Thanks, Lujean Rave, MSN, RNC-OB Diabetes Coordinator [phone] (8a-5p)

## 2017-11-14 NOTE — Progress Notes (Signed)
Discussed results of coronary CTA with Dr. Delton See.  Patient has high risk scan with high grade lesion in LAD.  Will set up for cardiac cath in am.

## 2017-11-14 NOTE — Care Management Note (Signed)
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   Expected Discharge Date:   possibly 11/14/2017               Expected Discharge Plan:  Home/Self Care  In-House Referral:   Financial Counselor  Discharge planning Services  CM Consult  Status of Service:  In process, will continue to follow  Cherrie Distance, RN 11/14/2017, 10:55 AM

## 2017-11-14 NOTE — Progress Notes (Signed)
Inpatient Diabetes Program Recommendations  AACE/ADA: New Consensus Statement on Inpatient Glycemic Control (2015)  Target Ranges:  Prepandial:   less than 140 mg/dL      Peak postprandial:   less than 180 mg/dL (1-2 hours)      Critically ill patients:  140 - 180 mg/dL   Lab Results  Component Value Date   GLUCAP 204 (H) 11/14/2017   HGBA1C 9.1 (H) 11/13/2017    Review of Glycemic Control Results for Devin [name], Devin [name] (MRN [id number]) as of 11/14/2017 15:46  Ref. Range 11/13/2017 22:06 11/14/2017 01:32 11/14/2017 07:55 11/14/2017 11:24  Glucose-Capillary Latest Ref Range: 70 - 99 mg/dL 162 (H) 446 (H) 950 (H) 204 (H)   Diabetes history: Type 2 DM Outpatient Diabetes medications: Glipizide 10 mg BID, Novolin 70/30 15 units BID, Metformin 1000 mg BID Current orders for Inpatient glycemic control: Novolog 0-15 units TID, Novolog 0-5 units QHS  Inpatient Diabetes Program Recommendations:    Spoke with patient regarding outpatient management of diabetes. Patient states, "I haven't been taking my medications because every time I would, it would make me sick. I even told my doctor about the symptoms and they would make changes and I felt worse." When inquired about symptoms they included: dizziness, nausea and diarrhea. Patient denies ever taking Novolin 70/30 and has never self injected.  Reviewed patient's current A1c of 9.1%. Explained what a A1c is and what it measures. Also reviewed goal A1c with patient, importance of good glucose control @ home, and blood sugar goals. Reviewed patho of DM, need for insulin, vascular changes associated from poor control, and comorbidites. Reviewed varying side effects from oral antidiabetic agents. Discussed the role of Novolin 70/30 and the differences between short vs long acting insulin as well as reviewed when to take, ensure it's with a meal, survival skills and interventions.   Patient was only occasionally checking blood sugars and they would range from  120-220 mg/dL. Encouraged patient to begin checking blood sugars 2-3 times per day and follow up with PCP. Will place case management consult for PCP. Patient denies drinking a lot of sugary beverages and works to drink water. Briefly counseled on the importance of watching carb intake and looking at food labels.   At this time, patient tearful and worried about going back on antidiabetic agents. However, he is open to resuming them. He wants to think about his plan moving forward. Novolin 70/30 maybe reasonable, however, based off of current inpatient needs along with life style change he may have hypoglycemia. If he is willing may could start with Amaryl 2 mg QD and follow up with PCP. Will continue to follow and plan to see in AM.   Thanks, Lujean Rave, MSN, RNC-OB Diabetes Coordinator [phone] (8a-5p)

## 2017-11-14 NOTE — Progress Notes (Signed)
PROGRESS NOTE  Devin [name]  VOZ:[id number] DOB: Feb 02, 1971 DOA: 11/13/2017 PCP: Patient, No Pcp Per   Brief Narrative: Devin [name] is a 47 y.o. male with a history of tobacco use, HTN, IDT2DM, HLD, +FH CAD, known LAD plaque, and hypothyroidism who stopped taking all medications many months ago who presented to [facility] ED with central chest pain associated with dyspnea, diaphoresis, and sensation of left arm numbness. ASA, NTG given with improvement in symptoms. CXR, d-dimer, initial troponin were negative and ECG did not show ischemic changes. TSH found to be 84.9. On review of systems, pt confirms feeling weak, fatigued with dry skin. He was brought in for ACS evaluation, cardiology consultation. ECG showed sinus bradycardia and troponins remained negative. Coronary CT showed high risk LAD lesion for which catheterization is planned.  Assessment & Plan: Principal Problem:   Chest pain Active Problems:   Tobacco abuse   Hypertension   Hyperlipidemia   Hypothyroidism   Type 2 diabetes mellitus without complications (HCC)   CKD (chronic kidney disease)   Chest pain with moderate risk for cardiac etiology   Chest pain due to CAD  Chest pain, known LAD plaque: Though troponins negative, ECG not acutely ischemic, history is concerning in light of his risk factors. Coronary CT showed high risk lesion - LHC 9/5. I've made the pt NPO p MN, was discussed with cardiology.  - Continue ASA - Bradycardic, so will not start beta blocker - No statin due to history of rhabdomyolysis with crestor  Hypothyroidism: Untreated. TSH 84 (was >100 in the past). This is clearly symptomatically bothering the patient.  - Restart synthroid at modest dose for his size of daily. Recheck TFTs in 6 weeks and modify dose accordingly if (and only if) the patient was taking the medication.  Tobacco use:  - Cessation counseling provided.   T2DM: Formerly on insulin, HbA1c 9.1%. Suspect he does not require the  doses that he was prescribed as he was not taking them and CBGs are more easily controlled here.  - Continue SSI - Diabetes coordinator follow up appreciated.   Pyuria, leukocytosis:  - Check urine culture  Medical nonadherence: States he was taking all his pills which made him sick. Instead of taking fewer pills he stopped them all. Pt was getting more side effects, so stopped taking meds, then provider increased doses due to worsening lab abnormalities, so side effects worsened, etc.  - This was discussed at length as I believe the patient would likely take medications if he were able to negotiate with providers regarding which are essential and minimizing less high-yield medications.   Note, I don't believe the patient has CKD, but elevated creatinine due to body habitus. CG equation suggests his current CrCl is >159ml/min.   DVT prophylaxis: Lovenox Code Status: Full Family Communication: Heart healthy, carb-modified Disposition Plan: Home once work up completed  Consultants:   Cardiology  Diabetes Coordinator  Procedures:   None  Antimicrobials:  None   Subjective: Chest pain currently resolved. No dyspnea or focal weakness/numbness.   Objective: Vitals:   11/14/17 0749 11/14/17 1015 11/14/17 1147 11/14/17 1602  BP: 113/68 106/63 (!) 108/59 (!) 131/93  Pulse: (!) 55 (!) 53 (!) 59 (!) 55  Resp: 20  18 18   Temp: 97.9 F (36.6 C)  97.8 F (36.6 C) 98 F (36.7 C)  TempSrc: Oral  Oral Oral  SpO2: 98% 95% 96% 93%  Weight:      Height:  Intake/Output Summary (Last 24 hours) at 11/14/2017 1754 Last data filed at 11/14/2017 1655 Gross per 24 hour  Intake 940 ml  Output 500 ml  Net 440 ml   Filed Weights   11/14/17 0103 11/14/17 0317 11/14/17 0708  Weight: 133.5 kg (!) 289.5 kg 131 kg    Gen: Heavily built 47 y.o. male in no distress HEENT: Nasal-sounding voice, large tongue.  Pulm: Non-labored breathing room air. Clear to auscultation bilaterally.  CV:  Regular rate and rhythm. No murmur, rub, or gallop. No JVD, no pedal edema. GI: Abdomen soft, non-tender, non-distended, with normoactive bowel sounds. No organomegaly or masses felt. Ext: Warm, no deformities Skin: No rashes, lesions no ulcers Neuro: Alert and oriented. No focal neurological deficits. Psych: Judgement and insight appear normal. Mood & affect appropriate.   Data Reviewed: I have personally reviewed following labs and imaging studies  CBC: Recent Labs  Lab 11/13/17 1729 11/14/17 0107  WBC 12.8* 13.1*  HGB 14.1 13.8  HCT 42.5 42.4  MCV 91.6 91.6  PLT 238 219   Basic Metabolic Panel: Recent Labs  Lab 11/13/17 1729 11/14/17 0107  NA 136  --   K 3.5  --   CL 101  --   CO2 27  --   GLUCOSE 241*  --   BUN 18  --   CREATININE 1.55* 1.41*  CALCIUM 9.4  --    GFR: Estimated Creatinine Clearance: 100.3 mL/min (A) (by C-G formula based on SCr of 1.41 mg/dL (H)). Liver Function Tests: No results for input(s): AST, ALT, ALKPHOS, BILITOT, PROT, ALBUMIN in the last 168 hours. No results for input(s): LIPASE, AMYLASE in the last 168 hours. No results for input(s): AMMONIA in the last 168 hours. Coagulation Profile: No results for input(s): INR, PROTIME in the last 168 hours. Cardiac Enzymes: Recent Labs  Lab 11/14/17 0107 11/14/17 1221  TROPONINI <0.03 <0.03   BNP (last 3 results) No results for input(s): PROBNP in the last 8760 hours. HbA1C: Recent Labs    11/13/17 1729  HGBA1C 9.1*   CBG: Recent Labs  Lab 11/13/17 2206 11/14/17 0132 11/14/17 0755 11/14/17 1124 11/14/17 1639  GLUCAP 146* 233* 195* 204* 178*   Lipid Profile: No results for input(s): CHOL, HDL, LDLCALC, TRIG, CHOLHDL, LDLDIRECT in the last 72 hours. Thyroid Function Tests: Recent Labs    11/13/17 1729  TSH 84.910*   Anemia Panel: No results for input(s): VITAMINB12, FOLATE, FERRITIN, TIBC, IRON, RETICCTPCT in the last 72 hours. Urine analysis:    Component Value Date/Time     COLORURINE AMBER (A) 11/13/2017 2006   APPEARANCEUR TURBID (A) 11/13/2017 2006   LABSPEC 1.026 11/13/2017 2006   PHURINE 6.0 11/13/2017 2006   GLUCOSEU 50 (A) 11/13/2017 2006   HGBUR NEGATIVE 11/13/2017 2006   BILIRUBINUR NEGATIVE 11/13/2017 2006   KETONESUR NEGATIVE 11/13/2017 2006   PROTEINUR 30 (A) 11/13/2017 2006   UROBILINOGEN 0.2 10/06/2013 0305   NITRITE NEGATIVE 11/13/2017 2006   LEUKOCYTESUR NEGATIVE 11/13/2017 2006   No results found for this or any previous visit (from the past 240 hour(s)).    Radiology Studies: Dg Chest 2 View  Result Date: 11/13/2017 CLINICAL DATA:  Chest pain, shortness of breath, chest heaviness EXAM: CHEST - 2 VIEW COMPARISON:  04/28/2017 FINDINGS: Normal heart size, mediastinal contours, and pulmonary vascularity. Lungs clear. No pleural effusion or pneumothorax. Bones unremarkable. IMPRESSION: Normal exam. Electronically Signed   By: Ulyses Southward M.D.   On: 11/13/2017 16:12    Scheduled Meds: .  amLODipine  2.5 mg Oral Daily  . [START ON 11/15/2017] aspirin EC  81 mg Oral Daily  . enoxaparin (LOVENOX) injection  40 mg Subcutaneous Q24H  . famotidine  20 mg Oral BID  . insulin aspart  0-15 Units Subcutaneous TID WC  . insulin aspart  0-5 Units Subcutaneous QHS  . levothyroxine  125 mcg Oral QAC breakfast  . nitroGLYCERIN  0.5 inch Topical Q6H  . nitroGLYCERIN       Continuous Infusions:   LOS: 0 days   Time spent: 25 minutes.  Tyrone Nine, MD [facility] [email or web address] Password [facility] 11/14/2017, 5:54 PM

## 2017-11-14 NOTE — Progress Notes (Signed)
Patient has order for cardiology consult.  Wound nurse canceled the order by mistake.  Attending MD notified via amion pager. Elnita Maxwell, RN

## 2017-11-14 NOTE — Plan of Care (Signed)
  Problem: Activity: Goal: Risk for activity intolerance will decrease Outcome: Progressing   Problem: Elimination: Goal: Will not experience complications related to bowel motility Outcome: Progressing   Problem: Pain Managment: Goal: General experience of comfort will improve Outcome: Progressing   Problem: Safety: Goal: Ability to remain free from injury will improve Outcome: Progressing   

## 2017-11-15 ENCOUNTER — Encounter (HOSPITAL_COMMUNITY): Payer: Self-pay | Admitting: General Practice

## 2017-11-15 ENCOUNTER — Inpatient Hospital Stay (HOSPITAL_COMMUNITY)
Admission: EM | Disposition: A | Discharge status: Home / Self Care | Payer: Self-pay | Source: Home / Self Care | Attending: Family Medicine

## 2017-11-15 ENCOUNTER — Other Ambulatory Visit: Payer: Self-pay

## 2017-11-15 DIAGNOSIS — I25118 Atherosclerotic heart disease of native coronary artery with other forms of angina pectoris: Principal | ICD-10-CM

## 2017-11-15 DIAGNOSIS — R931 Abnormal findings on diagnostic imaging of heart and coronary circulation: Secondary | ICD-10-CM

## 2017-11-15 DIAGNOSIS — I2510 Atherosclerotic heart disease of native coronary artery without angina pectoris: Secondary | ICD-10-CM

## 2017-11-15 HISTORY — PX: CORONARY STENT INTERVENTION: CATH118234

## 2017-11-15 HISTORY — PX: LEFT HEART CATH AND CORONARY ANGIOGRAPHY: CATH118249

## 2017-11-15 LAB — GLUCOSE, CAPILLARY
GLUCOSE-CAPILLARY: 161 mg/dL — AB (ref 70–99)
Glucose-Capillary: 150 mg/dL — ABNORMAL HIGH (ref 70–99)
Glucose-Capillary: 116 mg/dL — ABNORMAL HIGH (ref 70–99)
Glucose-Capillary: 180 mg/dL — ABNORMAL HIGH (ref 70–99)

## 2017-11-15 LAB — CBC
HEMATOCRIT: 39.1 % (ref 39.0–52.0)
Hemoglobin: 12.8 g/dL — ABNORMAL LOW (ref 13.0–17.0)
MCH: 29.9 pg (ref 26.0–34.0)
MCHC: 32.7 g/dL (ref 30.0–36.0)
MCV: 91.4 fL (ref 78.0–100.0)
PLATELETS: 222 10*3/uL (ref 150–400)
RBC: 4.28 MIL/uL (ref 4.22–5.81)
RDW: 16.3 % — AB (ref 11.5–15.5)
WBC: 12.4 10*3/uL — AB (ref 4.0–10.5)

## 2017-11-15 LAB — BASIC METABOLIC PANEL
Anion gap: 9 (ref 5–15)
BUN: 18 mg/dL (ref 6–20)
CHLORIDE: 104 mmol/L (ref 98–111)
CO2: 25 mmol/L (ref 22–32)
CREATININE: 1.41 mg/dL — AB (ref 0.61–1.24)
Calcium: 9.2 mg/dL (ref 8.9–10.3)
GFR calc Af Amer: >60 mL/min (ref >60)
GFR calc non Af Amer: 59 mL/min — ABNORMAL LOW (ref >60)
GLUCOSE: 168 mg/dL — AB (ref 70–99)
Potassium: 4 mmol/L (ref 3.5–5.1)
SODIUM: 138 mmol/L (ref 135–145)

## 2017-11-15 LAB — POCT ACTIVATED CLOTTING TIME: ACTIVATED CLOTTING TIME: 637 s

## 2017-11-15 SURGERY — LEFT HEART CATH AND CORONARY ANGIOGRAPHY
Anesthesia: LOCAL

## 2017-11-15 MED ORDER — IOPAMIDOL (ISOVUE-370) INJECTION 76%
INTRAVENOUS | Status: AC
  Filled 2017-11-15: qty 50

## 2017-11-15 MED ORDER — SODIUM CHLORIDE 0.9 % WEIGHT BASED INFUSION
1.0000 mL/kg/h | INTRAVENOUS | Status: DC
  Administered 2017-11-15 (×2): 1 mL/kg/h via INTRAVENOUS

## 2017-11-15 MED ORDER — TICAGRELOR 90 MG PO TABS
90.0000 mg | ORAL_TABLET | Freq: Two times a day (BID) | ORAL | Status: DC
  Administered 2017-11-16: 06:00:00 90 mg via ORAL
  Filled 2017-11-15 (×2): qty 1

## 2017-11-15 MED ORDER — SODIUM CHLORIDE 0.9% FLUSH: 3.0000 mL | Freq: Two times a day (BID) | INTRAVENOUS | Status: DC

## 2017-11-15 MED ORDER — TICAGRELOR 90 MG PO TABS
ORAL_TABLET | ORAL | Status: DC | PRN
  Administered 2017-11-15: 180 mg via ORAL

## 2017-11-15 MED ORDER — HEPARIN SODIUM (PORCINE) 1000 UNIT/ML IJ SOLN
INTRAMUSCULAR | Status: AC
  Filled 2017-11-15: qty 1

## 2017-11-15 MED ORDER — MIDAZOLAM HCL 2 MG/2ML IJ SOLN
INTRAMUSCULAR | Status: DC | PRN
  Administered 2017-11-15: 1 mg via INTRAVENOUS

## 2017-11-15 MED ORDER — SODIUM CHLORIDE 0.9 % WEIGHT BASED INFUSION
3.0000 mL/kg/h | INTRAVENOUS | Status: DC
  Administered 2017-11-15: 3 mL/kg/h via INTRAVENOUS

## 2017-11-15 MED ORDER — HEPARIN SODIUM (PORCINE) 1000 UNIT/ML IJ SOLN
INTRAMUSCULAR | Status: DC | PRN
  Administered 2017-11-15: 8000 [IU] via INTRAVENOUS

## 2017-11-15 MED ORDER — ASPIRIN 81 MG PO CHEW: 81.0000 mg | CHEWABLE_TABLET | ORAL | Status: DC

## 2017-11-15 MED ORDER — FENTANYL CITRATE (PF) 100 MCG/2ML IJ SOLN
INTRAMUSCULAR | Status: DC | PRN
  Administered 2017-11-15: 25 ug via INTRAVENOUS

## 2017-11-15 MED ORDER — HYDRALAZINE HCL 20 MG/ML IJ SOLN: 5.0000 mg | INTRAMUSCULAR | Status: AC | PRN

## 2017-11-15 MED ORDER — ANGIOPLASTY BOOK
Freq: Once | Status: AC
  Administered 2017-11-15: 21:00:00 1
  Filled 2017-11-15: qty 1

## 2017-11-15 MED ORDER — LABETALOL HCL 5 MG/ML IV SOLN: 10.0000 mg | INTRAVENOUS | Status: AC | PRN

## 2017-11-15 MED ORDER — IOPAMIDOL (ISOVUE-370) INJECTION 76%
INTRAVENOUS | Status: AC
  Filled 2017-11-15: qty 100

## 2017-11-15 MED ORDER — SODIUM CHLORIDE 0.9 % IV SOLN: INTRAVENOUS | Status: AC

## 2017-11-15 MED ORDER — SODIUM CHLORIDE 0.9 % IV SOLN
INTRAVENOUS | Status: DC | PRN
  Administered 2017-11-15: 1.75 mg/kg/h via INTRAVENOUS

## 2017-11-15 MED ORDER — VERAPAMIL HCL 2.5 MG/ML IV SOLN
INTRAVENOUS | Status: AC
  Filled 2017-11-15: qty 2

## 2017-11-15 MED ORDER — IOPAMIDOL (ISOVUE-370) INJECTION 76%
INTRAVENOUS | Status: DC | PRN
  Administered 2017-11-15: 280 mL via INTRA_ARTERIAL

## 2017-11-15 MED ORDER — VERAPAMIL HCL 2.5 MG/ML IV SOLN
INTRAVENOUS | Status: DC | PRN
  Administered 2017-11-15: 10 mL via INTRA_ARTERIAL

## 2017-11-15 MED ORDER — HEPARIN (PORCINE) IN NACL 1000-0.9 UT/500ML-% IV SOLN
INTRAVENOUS | Status: DC | PRN
  Administered 2017-11-15 (×2): 500 mL

## 2017-11-15 MED ORDER — SODIUM CHLORIDE 0.9 % IV SOLN: 250.0000 mL | INTRAVENOUS | Status: DC | PRN

## 2017-11-15 MED ORDER — SODIUM CHLORIDE 0.9% FLUSH: 3.0000 mL | INTRAVENOUS | Status: DC | PRN

## 2017-11-15 MED ORDER — FENTANYL CITRATE (PF) 100 MCG/2ML IJ SOLN
INTRAMUSCULAR | Status: AC
  Filled 2017-11-15: qty 2

## 2017-11-15 MED ORDER — HEPARIN (PORCINE) IN NACL 1000-0.9 UT/500ML-% IV SOLN
INTRAVENOUS | Status: AC
  Filled 2017-11-15: qty 1000

## 2017-11-15 MED ORDER — BIVALIRUDIN BOLUS VIA INFUSION - CUPID
INTRAVENOUS | Status: DC | PRN
  Administered 2017-11-15: 99 mg via INTRAVENOUS

## 2017-11-15 MED ORDER — SODIUM CHLORIDE 0.9 % IV SOLN
INTRAVENOUS | Status: AC | PRN
  Administered 2017-11-15: 1.75 mg/kg/h via INTRAVENOUS

## 2017-11-15 MED ORDER — BIVALIRUDIN TRIFLUOROACETATE 250 MG IV SOLR
INTRAVENOUS | Status: AC
  Filled 2017-11-15: qty 250

## 2017-11-15 MED ORDER — TICAGRELOR 90 MG PO TABS
ORAL_TABLET | ORAL | Status: AC
  Filled 2017-11-15: qty 2

## 2017-11-15 MED ORDER — NITROGLYCERIN 1 MG/10 ML FOR IR/CATH LAB
INTRA_ARTERIAL | Status: AC
  Filled 2017-11-15: qty 10

## 2017-11-15 MED ORDER — MIDAZOLAM HCL 2 MG/2ML IJ SOLN
INTRAMUSCULAR | Status: AC
  Filled 2017-11-15: qty 2

## 2017-11-15 MED ORDER — LIDOCAINE HCL (PF) 1 % IJ SOLN
INTRAMUSCULAR | Status: AC
  Filled 2017-11-15: qty 30

## 2017-11-15 SURGICAL SUPPLY — 18 items
BALLN SAPPHIRE 2.0X12 (BALLOONS) ×2
BALLOON SAPPHIRE 2.0X12 (BALLOONS) ×1 IMPLANT
CATH INFINITI 5FR ANG PIGTAIL (CATHETERS) ×2 IMPLANT
CATH OPTITORQUE TIG 4.0 5F (CATHETERS) ×2 IMPLANT
CATH VISTA GUIDE 6FR XBLAD3.5 (CATHETERS) ×2 IMPLANT
DEVICE RAD COMP TR BAND LRG (VASCULAR PRODUCTS) ×2 IMPLANT
GLIDESHEATH SLEND A-KIT 6F 22G (SHEATH) ×2 IMPLANT
GUIDEWIRE INQWIRE 1.5J.035X260 (WIRE) ×1 IMPLANT
INQWIRE 1.5J .035X260CM (WIRE) ×2
KIT ENCORE 26 ADVANTAGE (KITS) ×2 IMPLANT
KIT HEART LEFT (KITS) ×2 IMPLANT
PACK CARDIAC CATHETERIZATION (CUSTOM PROCEDURE TRAY) ×2 IMPLANT
STENT SYNERGY DES 2.25X12 (Permanent Stent) ×2 IMPLANT
STENT SYNERGY DES 2.25X16 (Permanent Stent) ×4 IMPLANT
SYR MEDRAD MARK V 150ML (SYRINGE) ×2 IMPLANT
TRANSDUCER W/STOPCOCK (MISCELLANEOUS) ×2 IMPLANT
TUBING CIL FLEX 10 FLL-RA (TUBING) ×2 IMPLANT
WIRE ASAHI PROWATER 180CM (WIRE) ×2 IMPLANT

## 2017-11-15 NOTE — Interval H&P Note (Signed)
History and Physical Interval Note:  11/15/2017 3:37 PM  Devin [name]  has presented today for surgery, with the diagnosis of cp/Angina & Abnormal Coronary CTA The various methods of treatment have been discussed with the patient and family. After consideration of risks, benefits and other options for treatment, the patient has consented to  Procedure(s): LEFT HEART CATH AND CORONARY ANGIOGRAPHY (N/A) as a surgical intervention .  The patient's history has been reviewed, patient examined, no change in status, stable for surgery.  I have reviewed the patient's chart and labs.  Questions were answered to the patient's satisfaction.    Cath Lab Visit (complete for each Cath Lab visit)  Clinical Evaluation Leading to the Procedure:   ACS: No.  Non-ACS:    Anginal Classification: CCS III  Anti-ischemic medical therapy: Minimal Therapy (1 class of medications)  Non-Invasive Test Results: High-risk stress test findings: cardiac mortality >3%/year - High Risk Cor CTA  Prior CABG: No previous CABG   Bryan Lemma

## 2017-11-15 NOTE — Progress Notes (Addendum)
PROGRESS NOTE  Devin [name]  JYN:[id number] DOB: 11/01/1970 DOA: 11/13/2017 PCP: Patient, No Pcp Per   Brief Narrative: Devin [name] is a 46 y.o. male with a history of tobacco use, HTN, IDT2DM, HLD, +FH CAD, known LAD plaque, and hypothyroidism who stopped taking all medications many months ago who presented to [facility] ED with central chest pain associated with dyspnea, diaphoresis, and sensation of left arm numbness. ASA, NTG given with improvement in symptoms. CXR, d-dimer, initial troponin were negative and ECG did not show ischemic changes. TSH found to be 84.9. On review of systems, pt confirms feeling weak, fatigued with dry skin. He was brought in for ACS evaluation, cardiology consultation. ECG showed sinus bradycardia and troponins remained negative. Coronary CT showed high risk LAD lesion for which catheterization is planned.  Assessment & Plan: Principal Problem:   Chest pain Active Problems:   Tobacco abuse   Hypertension   Hyperlipidemia   Hypothyroidism   Type 2 diabetes mellitus without complications (HCC)   CKD (chronic kidney disease)   Chest pain with moderate risk for cardiac etiology   Chest pain due to CAD  Chest pain, known LAD plaque: Though troponins negative, ECG not acutely ischemic, history is concerning in light of his risk factors. Coronary CT showed high risk lesion - LHC today.  - Continued ASA - Bradycardic, so will not start beta blocker - No statin due to history of rhabdomyolysis with crestor  Hypothyroidism: Untreated. TSH 84 (was >100 in the past). This is clearly symptomatically bothering the patient.  - Restarted synthroid at modest dose for his size of daily. Recheck TFTs in 6 weeks and modify dose accordingly if (and only if) the patient was taking the medication.  Tobacco use:  - Cessation counseling provided.   T2DM: Formerly on insulin, HbA1c 9.1%, poorly controlled with hyperglycemia. Suspect he does not require the doses that he  was prescribed as he was not taking them and CBGs are more easily controlled here.  - Continue SSI - Diabetes coordinator follow up appreciated. Will prescribe based on needs as inpatient. Pt amenable.   Pyuria, leukocytosis:  - Check urine culture (pending)  Medical nonadherence: States he was taking all his pills which made him sick. Instead of taking fewer pills he stopped them all. Pt was getting more side effects, so stopped taking meds, then provider increased doses due to worsening lab abnormalities, so side effects worsened, etc.  - This was discussed at length as I believe the patient would likely take medications if he were able to negotiate with providers regarding which are essential and minimizing less high-yield medications.   Note, I don't believe the patient has CKD, but elevated creatinine due to body habitus. CG equation suggests his current CrCl is >128ml/min.   DVT prophylaxis: Lovenox Code Status: Full Family Communication: Sister at bedside Disposition Plan: Home once work up completed  Consultants:   Cardiology  Diabetes Coordinator  Procedures:   Left heart catheterization 11/15/2017  Antimicrobials:  None   Subjective: Didn't sleep well. Has no illness related to medications that he can tell. No chest pain, numbness, weakness, tingling.   Objective: Vitals:   11/15/17 0549 11/15/17 0803 11/15/17 0806 11/15/17 1202  BP: 110/66 124/80  (!) 148/91  Pulse: (!) 57 (!) 53 (!) 56 61  Resp: 20  13 14   Temp: 98.2 F (36.8 C) 99 F (37.2 C)    TempSrc: Oral Oral  Oral  SpO2: 94% 90%  95%  Weight:  132 kg     Height:        Intake/Output Summary (Last 24 hours) at 11/15/2017 1358 Last data filed at 11/15/2017 0832 Gross per 24 hour  Intake 642.6 ml  Output 1300 ml  Net -657.4 ml   Filed Weights   11/14/17 0317 11/14/17 0708 11/15/17 0549  Weight: (!) 289.5 kg 131 kg 132 kg   Gen: Large 46yo male in no distress Pulm: Nonlabored breathing room air.  Clear. CV: Regular rate and rhythm. No murmur, rub, or gallop. No JVD, no dependent edema. GI: Abdomen soft, non-tender, non-distended, with normoactive bowel sounds.  Ext: Warm, no deformities Skin: No rashes, lesions or ulcers on visualized skin.  Neuro: Alert and oriented. No focal neurological deficits. Psych: Judgement and insight appear fair. Mood euthymic & affect congruent. Behavior is appropriate.    Data Reviewed: I have personally reviewed following labs and imaging studies  CBC: Recent Labs  Lab 11/13/17 1729 11/14/17 0107 11/15/17 0415  WBC 12.8* 13.1* 12.4*  HGB 14.1 13.8 12.8*  HCT 42.5 42.4 39.1  MCV 91.6 91.6 91.4  PLT 238 219 222   Basic Metabolic Panel: Recent Labs  Lab 11/13/17 1729 11/14/17 0107 11/15/17 0415  NA 136  --  138  K 3.5  --  4.0  CL 101  --  104  CO2 27  --  25  GLUCOSE 241*  --  168*  BUN 18  --  18  CREATININE 1.55* 1.41* 1.41*  CALCIUM 9.4  --  9.2   GFR: Estimated Creatinine Clearance: 100.7 mL/min (A) (by C-G formula based on SCr of 1.41 mg/dL (H)). Liver Function Tests: No results for input(s): AST, ALT, ALKPHOS, BILITOT, PROT, ALBUMIN in the last 168 hours. No results for input(s): LIPASE, AMYLASE in the last 168 hours. No results for input(s): AMMONIA in the last 168 hours. Coagulation Profile: No results for input(s): INR, PROTIME in the last 168 hours. Cardiac Enzymes: Recent Labs  Lab 11/14/17 0107 11/14/17 1221  TROPONINI <0.03 <0.03   BNP (last 3 results) No results for input(s): PROBNP in the last 8760 hours. HbA1C: Recent Labs    11/13/17 1729  HGBA1C 9.1*   CBG: Recent Labs  Lab 11/14/17 1124 11/14/17 1639 11/14/17 2146 11/15/17 0808 11/15/17 1141  GLUCAP 204* 178* 189* 161* 150*   Lipid Profile: No results for input(s): CHOL, HDL, LDLCALC, TRIG, CHOLHDL, LDLDIRECT in the last 72 hours. Thyroid Function Tests: Recent Labs    11/13/17 1729  TSH 84.910*   Anemia Panel: No results for  input(s): VITAMINB12, FOLATE, FERRITIN, TIBC, IRON, RETICCTPCT in the last 72 hours. Urine analysis:    Component Value Date/Time   COLORURINE AMBER (A) 11/13/2017 2006   APPEARANCEUR TURBID (A) 11/13/2017 2006   LABSPEC 1.026 11/13/2017 2006   PHURINE 6.0 11/13/2017 2006   GLUCOSEU 50 (A) 11/13/2017 2006   HGBUR NEGATIVE 11/13/2017 2006   BILIRUBINUR NEGATIVE 11/13/2017 2006   KETONESUR NEGATIVE 11/13/2017 2006   PROTEINUR 30 (A) 11/13/2017 2006   UROBILINOGEN 0.2 10/06/2013 0305   NITRITE NEGATIVE 11/13/2017 2006   LEUKOCYTESUR NEGATIVE 11/13/2017 2006   No results found for this or any previous visit (from the past 240 hour(s)).    Radiology Studies: Dg Chest 2 View  Result Date: 11/13/2017 CLINICAL DATA:  Chest pain, shortness of breath, chest heaviness EXAM: CHEST - 2 VIEW COMPARISON:  04/28/2017 FINDINGS: Normal heart size, mediastinal contours, and pulmonary vascularity. Lungs clear. No pleural effusion or pneumothorax. Bones unremarkable. IMPRESSION:  Normal exam. Electronically Signed   By: Ulyses Southward M.D.   On: 11/13/2017 16:12   Ct Coronary Morph W/cta Cor W/score W/ca W/cm &/or Wo/cm  Addendum Date: 11/15/2017   ADDENDUM REPORT: 11/15/2017 08:14 EXAM: OVER-READ INTERPRETATION  CT CHEST The following report is an over-read performed by radiologist Dr. Deberah Pelton [facility] on 11/15/2017. This over-read does not include interpretation of cardiac or coronary anatomy or pathology. The cardiac/coronary CT interpretation by the cardiologist is attached. COMPARISON:  None. FINDINGS: The visualized portions of the lower lung fields show no suspicious nodules, masses, or infiltrates. The visualized portions of the mediastinum and chest wall are unremarkable. IMPRESSION: No significant non-cardiac abnormality in visualized portion of the thorax. Electronically Signed   By: Myles Rosenthal M.D.   On: 11/15/2017 08:14   Result Date: 11/15/2017 CLINICAL DATA:  47 year old-male with  known non-obstructive CAD on a cardiac catheterization in 2014, calcium score at the time 65. EXAM: Cardiac/Coronary  CT TECHNIQUE: The patient was scanned on a [employer]. FINDINGS: A 120 kV prospective scan was triggered in the descending thoracic aorta at 111 HU's. Axial non-contrast 3 mm slices were carried out through the heart. The data set was analyzed on a dedicated work station and scored using the Agatson method. Gantry rotation speed was 250 msecs and collimation was .6 mm. No beta blockade and 0.4 mg of sl NTG was given. The 3D data set was reconstructed in 5% intervals of the 67-82 % of the R-R cycle. Diastolic phases were analyzed on a dedicated work station using MPR, MIP and VRT modes. The patient received 80 cc of contrast. Aorta:  Normal size.  No calcifications.  No dissection. Aortic Valve:  Trileaflet.  No calcifications. Coronary Arteries:  Normal coronary origin.  Left dominance. Left main is a large artery that gives rise to LAD and LCX arteries. Left main has no significant stenosis. LAD is a large vessel that is severely diffusely diseased. There is a complex, long severe, predominantly non-calcified plaque in the proximal LAD with stenoses > 70%, this lesion has high risks features. Mid LAD has severe diffuse mostly calcified plaque with a focal stenosis of at least 50-69% but possibly > 70%, distal LAD has small lumen and diffuse plaque. D1 is severely diffusely diseased. LCX is large dominant artery that gives rise to two OM branches, PDA and PLA. There is mild non-calcified plaque in the proximal portion with stenosis 25-50%. OM1 has no significant stenosis. OM2 has ostial 50-69%. PDA is poorly visualized and has possible severe stenosis in the ostial portion. RCA is a small non-dominant artery that is diffusely diseased and occluded in the proximal portion. Other findings: Normal pulmonary vein drainage into the left atrium. Normal let atrial appendage without a thrombus.  IMPRESSION: 1. Coronary calcium score of 259. This was 32 percentile for age and sex matched control. 2. Normal coronary origin with left dominance. 3. Study quality affected by patient's size and suboptimal vasodilation. However, there is a severe high risk plaque in the proximal LAD with stenosis > 70% and possible severe stenosis in the mid LAD and first diagonal artery. A cardiac catheterization is recommended. 4. Dilated pulmonary artery measuring 34 mm suggestive of pulmonary hypertension. Electronically Signed: By: Tobias Alexander On: 11/14/2017 17:59    Scheduled Meds: . amLODipine  2.5 mg Oral Daily  . aspirin  81 mg Oral Pre-Cath  . aspirin EC  81 mg Oral Daily  . enoxaparin (LOVENOX) injection  40  mg Subcutaneous Q24H  . famotidine  20 mg Oral BID  . insulin aspart  0-15 Units Subcutaneous TID WC  . insulin aspart  0-5 Units Subcutaneous QHS  . levothyroxine  125 mcg Oral QAC breakfast  . nitroGLYCERIN  0.5 inch Topical Q6H  . sodium chloride flush  3 mL Intravenous Q12H   Continuous Infusions: . sodium chloride    . sodium chloride 1 mL/kg/hr (11/15/17 1350)     LOS: 1 day   Time spent: 25 minutes.  Tyrone Nine, MD [facility] [email or web address] Password [email or web address] 11/15/2017, 1:58 PM

## 2017-11-15 NOTE — Progress Notes (Signed)
Inpatient Diabetes Program Recommendations  AACE/ADA: New Consensus Statement on Inpatient Glycemic Control (2015)  Target Ranges:  Prepandial:   less than 140 mg/dL      Peak postprandial:   less than 180 mg/dL (1-2 hours)      Critically ill patients:  140 - 180 mg/dL   Lab Results  Component Value Date   GLUCAP 150 (H) 11/15/2017   HGBA1C 9.1 (H) 11/13/2017    Review of Glycemic Control Results for Devin [name], Devin [name] (MRN [id number]) as of 11/15/2017 15:02  Ref. Range 11/14/2017 11:24 11/14/2017 16:39 11/14/2017 21:46 11/15/2017 08:08 11/15/2017 11:41  Glucose-Capillary Latest Ref Range: 70 - 99 mg/dL 830 (H) 940 (H) 768 (H) 161 (H) 150 (H)   Diabetes history: Type 2 DM Outpatient Diabetes medications: Glipizide 10 mg BID, Novolin 70/30 15 units BID, Metformin 1000 mg BID Current orders for Inpatient glycemic control: Novolog 0-15 units TID, Novolog 0-5 units QHS  Inpatient Diabetes Program Recommendations:    Checked back in on patient regarding plan for outpatient diabetes management. He states, "I willing to do what we got to and would prefer to start back on pills."  We reviewed current inpatient insulin needs, which has been minimal compared to outpatient dosing. Since patient is willing, oral antidiabetic agents seem most reasonable.  Handout given for outpatient education classes and continue to reinforce checking blood sugars. Patient has meter and supplies. Reiterated need for continued follow up with a primary care MD. He states he will call to make the appointment. In regards to discharge, I would recommend Amaryl 2 mg QD and follow up with PCP.  Will place consult for dietitian.  Thanks, Lujean Rave, MSN, RNC-OB Diabetes Coordinator [phone] (8a-5p)

## 2017-11-15 NOTE — Progress Notes (Signed)
TR BAND REMOVAL  LOCATION:    right radial  DEFLATED PER PROTOCOL:    Yes.    TIME BAND OFF / DRESSING APPLIED:    21:45   SITE UPON ARRIVAL:    Level 0  SITE AFTER BAND REMOVAL:    Level 0  CIRCULATION SENSATION AND MOVEMENT:    Within Normal Limits   Yes.    COMMENTS:   Post TR band instructions given. Pt tolerated well. 

## 2017-11-15 NOTE — H&P (View-Only) (Signed)
Progress Note  Patient Name: Devin [name] Date of Encounter: 11/15/2017  Primary Cardiologist: No primary care provider on file.   Subjective   Denies any chest pain or SOB  Inpatient Medications    Scheduled Meds: . amLODipine  2.5 mg Oral Daily  . aspirin  81 mg Oral Pre-Cath  . aspirin EC  81 mg Oral Daily  . enoxaparin (LOVENOX) injection  40 mg Subcutaneous Q24H  . famotidine  20 mg Oral BID  . insulin aspart  0-15 Units Subcutaneous TID WC  . insulin aspart  0-5 Units Subcutaneous QHS  . levothyroxine  125 mcg Oral QAC breakfast  . nitroGLYCERIN  0.5 inch Topical Q6H  . sodium chloride flush  3 mL Intravenous Q12H   Continuous Infusions: . sodium chloride    . sodium chloride 1 mL/kg/hr (11/15/17 0832)   PRN Meds: sodium chloride, acetaminophen, gi cocktail, morphine injection, nitroGLYCERIN, ondansetron (ZOFRAN) IV, sodium chloride flush   Vital Signs    Vitals:   11/15/17 0549 11/15/17 0803 11/15/17 0806 11/15/17 1202  BP: 110/66 124/80  (!) 148/91  Pulse: (!) 57 (!) 53 (!) 56 61  Resp: 20  13 14   Temp: 98.2 F (36.8 C) 99 F (37.2 C)    TempSrc: Oral Oral  Oral  SpO2: 94% 90%  95%  Weight: 132 kg     Height:        Intake/Output Summary (Last 24 hours) at 11/15/2017 1323 Last data filed at 11/15/2017 1610 Gross per 24 hour  Intake 882.6 ml  Output 1300 ml  Net -417.4 ml   Filed Weights   11/14/17 0317 11/14/17 0708 11/15/17 0549  Weight: (!) 289.5 kg 131 kg 132 kg    Telemetry    NSR - Personally Reviewed  ECG    No new EKG to review - Personally Reviewed  Physical Exam   GEN: No acute distress.   Neck: No JVD Cardiac: RRR, no murmurs, rubs, or gallops.  Respiratory: Clear to auscultation bilaterally. GI: Soft, nontender, non-distended  MS: No edema; No deformity. Neuro:  Nonfocal  Psych: Normal affect   Labs    Chemistry Recent Labs  Lab 11/13/17 1729 11/14/17 0107 11/15/17 0415  NA 136  --  138  K 3.5  --  4.0  CL 101   --  104  CO2 27  --  25  GLUCOSE 241*  --  168*  BUN 18  --  18  CREATININE 1.55* 1.41* 1.41*  CALCIUM 9.4  --  9.2  GFRNONAA 52* 59* 59*  GFRAA >60 >60 >60  ANIONGAP 8  --  9     Hematology Recent Labs  Lab 11/13/17 1729 11/14/17 0107 11/15/17 0415  WBC 12.8* 13.1* 12.4*  RBC 4.64 4.63 4.28  HGB 14.1 13.8 12.8*  HCT 42.5 42.4 39.1  MCV 91.6 91.6 91.4  MCH 30.4 29.8 29.9  MCHC 33.2 32.5 32.7  RDW 16.5* 16.2* 16.3*  PLT 238 219 222    Cardiac Enzymes Recent Labs  Lab 11/14/17 0107 11/14/17 1221  TROPONINI <0.03 <0.03    Recent Labs  Lab 11/13/17 1726  TROPIPOC 0.00     BNPNo results for input(s): BNP, PROBNP in the last 168 hours.   DDimer  Recent Labs  Lab 11/13/17 1729  DDIMER 0.35     Radiology    Dg Chest 2 View  Result Date: 11/13/2017 CLINICAL DATA:  Chest pain, shortness of breath, chest heaviness EXAM: CHEST - 2 VIEW  COMPARISON:  04/28/2017 FINDINGS: Normal heart size, mediastinal contours, and pulmonary vascularity. Lungs clear. No pleural effusion or pneumothorax. Bones unremarkable. IMPRESSION: Normal exam. Electronically Signed   By: Ulyses Southward M.D.   On: 11/13/2017 16:12   Ct Coronary Morph W/cta Cor W/score W/ca W/cm &/or Wo/cm  Addendum Date: 11/15/2017   ADDENDUM REPORT: 11/15/2017 08:14 EXAM: OVER-READ INTERPRETATION  CT CHEST The following report is an over-read performed by radiologist Dr. Deberah Pelton [facility] on 11/15/2017. This over-read does not include interpretation of cardiac or coronary anatomy or pathology. The cardiac/coronary CT interpretation by the cardiologist is attached. COMPARISON:  None. FINDINGS: The visualized portions of the lower lung fields show no suspicious nodules, masses, or infiltrates. The visualized portions of the mediastinum and chest wall are unremarkable. IMPRESSION: No significant non-cardiac abnormality in visualized portion of the thorax. Electronically Signed   By: Myles Rosenthal M.D.   On:  11/15/2017 08:14   Result Date: 11/15/2017 CLINICAL DATA:  47 year old-male with known non-obstructive CAD on a cardiac catheterization in 2014, calcium score at the time 65. EXAM: Cardiac/Coronary  CT TECHNIQUE: The patient was scanned on a [employer]. FINDINGS: A 120 kV prospective scan was triggered in the descending thoracic aorta at 111 HU's. Axial non-contrast 3 mm slices were carried out through the heart. The data set was analyzed on a dedicated work station and scored using the Agatson method. Gantry rotation speed was 250 msecs and collimation was .6 mm. No beta blockade and 0.4 mg of sl NTG was given. The 3D data set was reconstructed in 5% intervals of the 67-82 % of the R-R cycle. Diastolic phases were analyzed on a dedicated work station using MPR, MIP and VRT modes. The patient received 80 cc of contrast. Aorta:  Normal size.  No calcifications.  No dissection. Aortic Valve:  Trileaflet.  No calcifications. Coronary Arteries:  Normal coronary origin.  Left dominance. Left main is a large artery that gives rise to LAD and LCX arteries. Left main has no significant stenosis. LAD is a large vessel that is severely diffusely diseased. There is a complex, long severe, predominantly non-calcified plaque in the proximal LAD with stenoses > 70%, this lesion has high risks features. Mid LAD has severe diffuse mostly calcified plaque with a focal stenosis of at least 50-69% but possibly > 70%, distal LAD has small lumen and diffuse plaque. D1 is severely diffusely diseased. LCX is large dominant artery that gives rise to two OM branches, PDA and PLA. There is mild non-calcified plaque in the proximal portion with stenosis 25-50%. OM1 has no significant stenosis. OM2 has ostial 50-69%. PDA is poorly visualized and has possible severe stenosis in the ostial portion. RCA is a small non-dominant artery that is diffusely diseased and occluded in the proximal portion. Other findings: Normal pulmonary  vein drainage into the left atrium. Normal let atrial appendage without a thrombus. IMPRESSION: 1. Coronary calcium score of 259. This was 32 percentile for age and sex matched control. 2. Normal coronary origin with left dominance. 3. Study quality affected by patient's size and suboptimal vasodilation. However, there is a severe high risk plaque in the proximal LAD with stenosis > 70% and possible severe stenosis in the mid LAD and first diagonal artery. A cardiac catheterization is recommended. 4. Dilated pulmonary artery measuring 34 mm suggestive of pulmonary hypertension. Electronically Signed: By: Tobias Alexander On: 11/14/2017 17:59    Cardiac Studies   Coronary CT morph 11/14/2017 IMPRESSION: 1. Coronary  calcium score of 259. This was 64 percentile for age and sex matched control.  2. Normal coronary origin with left dominance.  3. Study quality affected by patient's size and suboptimal vasodilation. However, there is a severe high risk plaque in the proximal LAD with stenosis > 70% and possible severe stenosis in the mid LAD and first diagonal artery. A cardiac catheterization is recommended.  4. Dilated pulmonary artery measuring 34 mm suggestive of pulmonary hypertension.  Patient Profile     47 y.o. male obese male with a remote history of minimal CAD by cath in 2014, hypertension, hyperlipidemia, diabetes, ongoing tobacco abuse, GERD admitted with CP   Assessment & Plan    1.  Chest pain/ASCAD --Although he does not exercise, his job is strenuous at times. -He has no history of consistent exertional symptoms. --The symptoms have been occurring 3 or 4 times a year for at least the last 5 years. --However, the episode PTA was at a higher intensity than usual.  Also, the left arm numbness does not occur with every episode.   -His initial ECG was sinus rhythm, heart rate 94, no acute ischemic changes.  His initial blood pressure was 137/94, heart rate 104.     -Coronary  CTA with high grade LAD lesion --plan for cardiac cath today to define coronary anatomy further   --Continue ASA 81mg  daily,  NTPaste --no statin due to h/o rhabdo with Crestor --no BB due to bradycardia  2.  HTN - BP fairly well controlled today from 110/66 to 148/109mmHg - amlodipine 2.5mg  daily and NTPaste  3.  Hyperlipidemia --check FLP in am --LDL goal < 70 --no statin due to Rhabo with crestor --may need referral to lipid clinic to consider PCSK 9i  4.  ASACAD --cardiac catheterization 2014  revealed 30% proximal and 40% mid stenosis of the LAD, 30% proximal OM1 and small nondominant diffusely diseased RCA with normal LV function EF 55 to 65%.  Management was recommended. --coronary CTA now with high grade LAD lesion --plan cath today --continue ASA  --no statin due to h/o rhabdo with Crestor  5.  Hypothyroidism --TSH 153 --now on synthroid  6.  Bradycardia --likely related to hypothyroidism --asymptomatic      For questions or updates, please contact [facility] Please consult [email or web address] for contact info under Cardiology/STEMI.      Signed, Armanda Magic, MD  11/15/2017, 1:23 PM

## 2017-11-15 NOTE — Progress Notes (Addendum)
 Progress Note  Patient Name: Devin [name] Date of Encounter: 11/15/2017  Primary Cardiologist: No primary care provider on file.   Subjective   Denies any chest pain or SOB  Inpatient Medications    Scheduled Meds: . amLODipine  2.5 mg Oral Daily  . aspirin  81 mg Oral Pre-Cath  . aspirin EC  81 mg Oral Daily  . enoxaparin (LOVENOX) injection  40 mg Subcutaneous Q24H  . famotidine  20 mg Oral BID  . insulin aspart  0-15 Units Subcutaneous TID WC  . insulin aspart  0-5 Units Subcutaneous QHS  . levothyroxine  125 mcg Oral QAC breakfast  . nitroGLYCERIN  0.5 inch Topical Q6H  . sodium chloride flush  3 mL Intravenous Q12H   Continuous Infusions: . sodium chloride    . sodium chloride 1 mL/kg/hr (11/15/17 0832)   PRN Meds: sodium chloride, acetaminophen, gi cocktail, morphine injection, nitroGLYCERIN, ondansetron (ZOFRAN) IV, sodium chloride flush   Vital Signs    Vitals:   11/15/17 0549 11/15/17 0803 11/15/17 0806 11/15/17 1202  BP: 110/66 124/80  (!) 148/91  Pulse: (!) 57 (!) 53 (!) 56 61  Resp: 20  13 14  Temp: 98.2 F (36.8 C) 99 F (37.2 C)    TempSrc: Oral Oral  Oral  SpO2: 94% 90%  95%  Weight: 132 kg     Height:        Intake/Output Summary (Last 24 hours) at 11/15/2017 1323 Last data filed at 11/15/2017 0832 Gross per 24 hour  Intake 882.6 ml  Output 1300 ml  Net -417.4 ml   Filed Weights   11/14/17 0317 11/14/17 0708 11/15/17 0549  Weight: (!) 289.5 kg 131 kg 132 kg    Telemetry    NSR - Personally Reviewed  ECG    No new EKG to review - Personally Reviewed  Physical Exam   GEN: No acute distress.   Neck: No JVD Cardiac: RRR, no murmurs, rubs, or gallops.  Respiratory: Clear to auscultation bilaterally. GI: Soft, nontender, non-distended  MS: No edema; No deformity. Neuro:  Nonfocal  Psych: Normal affect   Labs    Chemistry Recent Labs  Lab 11/13/17 1729 11/14/17 0107 11/15/17 0415  NA 136  --  138  K 3.5  --  4.0  CL 101   --  104  CO2 27  --  25  GLUCOSE 241*  --  168*  BUN 18  --  18  CREATININE 1.55* 1.41* 1.41*  CALCIUM 9.4  --  9.2  GFRNONAA 52* 59* 59*  GFRAA >60 >60 >60  ANIONGAP 8  --  9     Hematology Recent Labs  Lab 11/13/17 1729 11/14/17 0107 11/15/17 0415  WBC 12.8* 13.1* 12.4*  RBC 4.64 4.63 4.28  HGB 14.1 13.8 12.8*  HCT 42.5 42.4 39.1  MCV 91.6 91.6 91.4  MCH 30.4 29.8 29.9  MCHC 33.2 32.5 32.7  RDW 16.5* 16.2* 16.3*  PLT 238 219 222    Cardiac Enzymes Recent Labs  Lab 11/14/17 0107 11/14/17 1221  TROPONINI <0.03 <0.03    Recent Labs  Lab 11/13/17 1726  TROPIPOC 0.00     BNPNo results for input(s): BNP, PROBNP in the last 168 hours.   DDimer  Recent Labs  Lab 11/13/17 1729  DDIMER 0.35     Radiology    Dg Chest 2 View  Result Date: 11/13/2017 CLINICAL DATA:  Chest pain, shortness of breath, chest heaviness EXAM: CHEST - 2 VIEW   COMPARISON:  04/28/2017 FINDINGS: Normal heart size, mediastinal contours, and pulmonary vascularity. Lungs clear. No pleural effusion or pneumothorax. Bones unremarkable. IMPRESSION: Normal exam. Electronically Signed   By: Mark  Boles M.D.   On: 11/13/2017 16:12   Ct Coronary Morph W/cta Cor W/score W/ca W/cm &/or Wo/cm  Addendum Date: 11/15/2017   ADDENDUM REPORT: 11/15/2017 08:14 EXAM: OVER-READ INTERPRETATION  CT CHEST The following report is an over-read performed by radiologist Dr. John [facility] on 11/15/2017. This over-read does not include interpretation of cardiac or coronary anatomy or pathology. The cardiac/coronary CT interpretation by the cardiologist is attached. COMPARISON:  None. FINDINGS: The visualized portions of the lower lung fields show no suspicious nodules, masses, or infiltrates. The visualized portions of the mediastinum and chest wall are unremarkable. IMPRESSION: No significant non-cardiac abnormality in visualized portion of the thorax. Electronically Signed   By: John  Stahl M.D.   On:  11/15/2017 08:14   Result Date: 11/15/2017 CLINICAL DATA:  46-year-old-male with known non-obstructive CAD on a cardiac catheterization in 2014, calcium score at the time 65. EXAM: Cardiac/Coronary  CT TECHNIQUE: The patient was scanned on a [employer] scanner. FINDINGS: A 120 kV prospective scan was triggered in the descending thoracic aorta at 111 HU's. Axial non-contrast 3 mm slices were carried out through the heart. The data set was analyzed on a dedicated work station and scored using the Agatson method. Gantry rotation speed was 250 msecs and collimation was .6 mm. No beta blockade and 0.4 mg of sl NTG was given. The 3D data set was reconstructed in 5% intervals of the 67-82 % of the R-R cycle. Diastolic phases were analyzed on a dedicated work station using MPR, MIP and VRT modes. The patient received 80 cc of contrast. Aorta:  Normal size.  No calcifications.  No dissection. Aortic Valve:  Trileaflet.  No calcifications. Coronary Arteries:  Normal coronary origin.  Left dominance. Left main is a large artery that gives rise to LAD and LCX arteries. Left main has no significant stenosis. LAD is a large vessel that is severely diffusely diseased. There is a complex, long severe, predominantly non-calcified plaque in the proximal LAD with stenoses > 70%, this lesion has high risks features. Mid LAD has severe diffuse mostly calcified plaque with a focal stenosis of at least 50-69% but possibly > 70%, distal LAD has small lumen and diffuse plaque. D1 is severely diffusely diseased. LCX is large dominant artery that gives rise to two OM branches, PDA and PLA. There is mild non-calcified plaque in the proximal portion with stenosis 25-50%. OM1 has no significant stenosis. OM2 has ostial 50-69%. PDA is poorly visualized and has possible severe stenosis in the ostial portion. RCA is a small non-dominant artery that is diffusely diseased and occluded in the proximal portion. Other findings: Normal pulmonary  vein drainage into the left atrium. Normal let atrial appendage without a thrombus. IMPRESSION: 1. Coronary calcium score of 259. This was 98 percentile for age and sex matched control. 2. Normal coronary origin with left dominance. 3. Study quality affected by patient's size and suboptimal vasodilation. However, there is a severe high risk plaque in the proximal LAD with stenosis > 70% and possible severe stenosis in the mid LAD and first diagonal artery. A cardiac catheterization is recommended. 4. Dilated pulmonary artery measuring 34 mm suggestive of pulmonary hypertension. Electronically Signed: By: Katarina  Nelson On: 11/14/2017 17:59    Cardiac Studies   Coronary CT morph 11/14/2017 IMPRESSION: 1. Coronary   calcium score of 259. This was 98 percentile for age and sex matched control.  2. Normal coronary origin with left dominance.  3. Study quality affected by patient's size and suboptimal vasodilation. However, there is a severe high risk plaque in the proximal LAD with stenosis > 70% and possible severe stenosis in the mid LAD and first diagonal artery. A cardiac catheterization is recommended.  4. Dilated pulmonary artery measuring 34 mm suggestive of pulmonary hypertension.  Patient Profile     46 y.o. male obese male with a remote history of minimal CAD by cath in 2014, hypertension, hyperlipidemia, diabetes, ongoing tobacco abuse, GERD admitted with CP   Assessment & Plan    1.  Chest pain/ASCAD --Although he does not exercise, his job is strenuous at times. -He has no history of consistent exertional symptoms. --The symptoms have been occurring 3 or 4 times a year for at least the last 5 years. --However, the episode PTA was at a higher intensity than usual.  Also, the left arm numbness does not occur with every episode.   -His initial ECG was sinus rhythm, heart rate 94, no acute ischemic changes.  His initial blood pressure was 137/94, heart rate 104.     -Coronary  CTA with high grade LAD lesion --plan for cardiac cath today to define coronary anatomy further   --Continue ASA 81mg daily,  NTPaste --no statin due to h/o rhabdo with Crestor --no BB due to bradycardia  2.  HTN - BP fairly well controlled today from 110/66 to 148/91mmHg - amlodipine 2.5mg daily and NTPaste  3.  Hyperlipidemia --check FLP in am --LDL goal < 70 --no statin due to Rhabo with crestor --may need referral to lipid clinic to consider PCSK 9i  4.  ASACAD --cardiac catheterization 2014  revealed 30% proximal and 40% mid stenosis of the LAD, 30% proximal OM1 and small nondominant diffusely diseased RCA with normal LV function EF 55 to 65%.  Management was recommended. --coronary CTA now with high grade LAD lesion --plan cath today --continue ASA  --no statin due to h/o rhabdo with Crestor  5.  Hypothyroidism --TSH 153 --now on synthroid  6.  Bradycardia --likely related to hypothyroidism --asymptomatic      For questions or updates, please contact [facility] Please consult [email or web address] for contact info under Cardiology/STEMI.      Signed, Traci Turner, MD  11/15/2017, 1:23 PM   

## 2017-11-16 ENCOUNTER — Encounter (HOSPITAL_COMMUNITY): Payer: Self-pay | Admitting: Cardiology

## 2017-11-16 ENCOUNTER — Telehealth: Payer: Self-pay | Admitting: Cardiology

## 2017-11-16 DIAGNOSIS — R931 Abnormal findings on diagnostic imaging of heart and coronary circulation: Secondary | ICD-10-CM

## 2017-11-16 DIAGNOSIS — N189 Chronic kidney disease, unspecified: Secondary | ICD-10-CM

## 2017-11-16 DIAGNOSIS — I208 Other forms of angina pectoris: Secondary | ICD-10-CM

## 2017-11-16 DIAGNOSIS — E039 Hypothyroidism, unspecified: Secondary | ICD-10-CM

## 2017-11-16 DIAGNOSIS — Z720 Tobacco use: Secondary | ICD-10-CM

## 2017-11-16 DIAGNOSIS — I25119 Atherosclerotic heart disease of native coronary artery with unspecified angina pectoris: Secondary | ICD-10-CM

## 2017-11-16 DIAGNOSIS — I2510 Atherosclerotic heart disease of native coronary artery without angina pectoris: Secondary | ICD-10-CM

## 2017-11-16 DIAGNOSIS — E119 Type 2 diabetes mellitus without complications: Secondary | ICD-10-CM

## 2017-11-16 LAB — CBC
HCT: 40.5 % (ref 39.0–52.0)
Hemoglobin: 13.4 g/dL (ref 13.0–17.0)
MCH: 29.8 pg (ref 26.0–34.0)
MCHC: 33.1 g/dL (ref 30.0–36.0)
MCV: 90 fL (ref 78.0–100.0)
PLATELETS: 236 10*3/uL (ref 150–400)
RBC: 4.5 MIL/uL (ref 4.22–5.81)
RDW: 16.1 % — AB (ref 11.5–15.5)
WBC: 13.1 10*3/uL — ABNORMAL HIGH (ref 4.0–10.5)

## 2017-11-16 LAB — BASIC METABOLIC PANEL
Anion gap: 9 (ref 5–15)
BUN: 13 mg/dL (ref 6–20)
CALCIUM: 9.3 mg/dL (ref 8.9–10.3)
CO2: 25 mmol/L (ref 22–32)
CREATININE: 1.27 mg/dL — AB (ref 0.61–1.24)
Chloride: 101 mmol/L (ref 98–111)
GFR calc Af Amer: >60 mL/min (ref >60)
GFR calc non Af Amer: >60 mL/min (ref >60)
Glucose, Bld: 176 mg/dL — ABNORMAL HIGH (ref 70–99)
Potassium: 3.8 mmol/L (ref 3.5–5.1)
Sodium: 135 mmol/L (ref 135–145)

## 2017-11-16 LAB — GLUCOSE, CAPILLARY: Glucose-Capillary: 182 mg/dL — ABNORMAL HIGH (ref 70–99)

## 2017-11-16 MED ORDER — ISOSORBIDE MONONITRATE ER 30 MG PO TB24: 30.0000 mg | ORAL_TABLET | Freq: Every day | ORAL | 0 refills | Status: DC

## 2017-11-16 MED ORDER — GLIMEPIRIDE 2 MG PO TABS: 2.0000 mg | ORAL_TABLET | Freq: Every day | ORAL | 0 refills | Status: DC

## 2017-11-16 MED ORDER — ISOSORBIDE MONONITRATE ER 30 MG PO TB24
30.0000 mg | ORAL_TABLET | Freq: Every day | ORAL | Status: DC
  Administered 2017-11-16: 10:00:00 30 mg via ORAL
  Filled 2017-11-16: qty 1

## 2017-11-16 MED ORDER — AMLODIPINE BESYLATE 2.5 MG PO TABS: 2.5000 mg | ORAL_TABLET | Freq: Every day | ORAL | 0 refills | Status: DC

## 2017-11-16 MED ORDER — LEVOTHYROXINE SODIUM 125 MCG PO TABS: 125.0000 ug | ORAL_TABLET | Freq: Every day | ORAL | 0 refills | Status: DC

## 2017-11-16 MED ORDER — TICAGRELOR 90 MG PO TABS: 90.0000 mg | ORAL_TABLET | Freq: Two times a day (BID) | ORAL | 0 refills | Status: DC

## 2017-11-16 MED ORDER — PANTOPRAZOLE SODIUM 20 MG PO TBEC: 20.0000 mg | DELAYED_RELEASE_TABLET | Freq: Every day | ORAL | 0 refills | Status: DC

## 2017-11-16 MED ORDER — ASPIRIN 81 MG PO TBEC: 81.0000 mg | DELAYED_RELEASE_TABLET | Freq: Every day | ORAL | 0 refills | Status: DC

## 2017-11-16 MED FILL — Lidocaine HCl Local Preservative Free (PF) Inj 1%: INTRAMUSCULAR | Qty: 30 | Status: AC

## 2017-11-16 NOTE — Telephone Encounter (Signed)
No answer. Left message for pt to return call. Will forward to front office to schedule.

## 2017-11-16 NOTE — Telephone Encounter (Signed)
TOC per Noreene Larsson [facility]) had stent placed -needs to have fu 7-10 days. D/C [facility] 11/16/17

## 2017-11-16 NOTE — Progress Notes (Signed)
Progress Note  Patient Name: Devin [name] Date of Encounter: 11/16/2017  Primary Cardiologist: No primary care provider on file.   Subjective   Denies any chest pain or shortness of breath  Inpatient Medications    Scheduled Meds: . amLODipine  2.5 mg Oral Daily  . aspirin EC  81 mg Oral Daily  . enoxaparin (LOVENOX) injection  40 mg Subcutaneous Q24H  . famotidine  20 mg Oral BID  . insulin aspart  0-15 Units Subcutaneous TID WC  . insulin aspart  0-5 Units Subcutaneous QHS  . levothyroxine  125 mcg Oral QAC breakfast  . nitroGLYCERIN  0.5 inch Topical Q6H  . sodium chloride flush  3 mL Intravenous Q12H  . ticagrelor  90 mg Oral BID   Continuous Infusions: . sodium chloride     PRN Meds: sodium chloride, acetaminophen, gi cocktail, morphine injection, nitroGLYCERIN, ondansetron (ZOFRAN) IV, sodium chloride flush   Vital Signs    Vitals:   11/15/17 1959 11/15/17 2000 11/15/17 2100 11/16/17 0249  BP: (!) 171/88 (!) 163/86 131/83 127/69  Pulse: 67 68 75 71  Resp: 13 14 12 15   Temp: (!) 97.4 F (36.3 C)   98.4 F (36.9 C)  TempSrc: Oral   Oral  SpO2: 96% 100% 99% 95%  Weight:    133 kg  Height:        Intake/Output Summary (Last 24 hours) at 11/16/2017 0730 Last data filed at 11/15/2017 2300 Gross per 24 hour  Intake 1382.6 ml  Output 1100 ml  Net 282.6 ml   Filed Weights   11/14/17 0708 11/15/17 0549 11/16/17 0249  Weight: 131 kg 132 kg 133 kg    Telemetry    Sinus rhythm- Personally Reviewed  ECG    Sinus bradycardia 58 bpm with incomplete right branch block and nonspecific ST abnormality- Personally Reviewed  Physical Exam   GEN: No acute distress.   Neck: No JVD Cardiac: RRR, no murmurs, rubs, or gallops.  Respiratory: Clear to auscultation bilaterally. GI: Soft, nontender, non-distended  MS: No edema; No deformity. Neuro:  Nonfocal  Psych: Normal affect   Labs    Chemistry Recent Labs  Lab 11/13/17 1729 11/14/17 0107 11/15/17 0415  11/16/17 0239  NA 136  --  138 135  K 3.5  --  4.0 3.8  CL 101  --  104 101  CO2 27  --  25 25  GLUCOSE 241*  --  168* 176*  BUN 18  --  18 13  CREATININE 1.55* 1.41* 1.41* 1.27*  CALCIUM 9.4  --  9.2 9.3  GFRNONAA 52* 59* 59* >60  GFRAA >60 >60 >60 >60  ANIONGAP 8  --  9 9     Hematology Recent Labs  Lab 11/14/17 0107 11/15/17 0415 11/16/17 0239  WBC 13.1* 12.4* 13.1*  RBC 4.63 4.28 4.50  HGB 13.8 12.8* 13.4  HCT 42.4 39.1 40.5  MCV 91.6 91.4 90.0  MCH 29.8 29.9 29.8  MCHC 32.5 32.7 33.1  RDW 16.2* 16.3* 16.1*  PLT 219 222 236    Cardiac Enzymes Recent Labs  Lab 11/14/17 0107 11/14/17 1221  TROPONINI <0.03 <0.03    Recent Labs  Lab 11/13/17 1726  TROPIPOC 0.00     BNPNo results for input(s): BNP, PROBNP in the last 168 hours.   DDimer  Recent Labs  Lab 11/13/17 1729  DDIMER 0.35     Radiology    Ct Coronary Morph W/cta Cor W/score W/ca W/cm &/or Wo/cm  Addendum Date: 11/15/2017   ADDENDUM REPORT: 11/15/2017 08:14 EXAM: OVER-READ INTERPRETATION  CT CHEST The following report is an over-read performed by radiologist Dr. Deberah Pelton [facility] on 11/15/2017. This over-read does not include interpretation of cardiac or coronary anatomy or pathology. The cardiac/coronary CT interpretation by the cardiologist is attached. COMPARISON:  None. FINDINGS: The visualized portions of the lower lung fields show no suspicious nodules, masses, or infiltrates. The visualized portions of the mediastinum and chest wall are unremarkable. IMPRESSION: No significant non-cardiac abnormality in visualized portion of the thorax. Electronically Signed   By: Myles Rosenthal M.D.   On: 11/15/2017 08:14   Result Date: 11/15/2017 CLINICAL DATA:  47 year old-male with known non-obstructive CAD on a cardiac catheterization in 2014, calcium score at the time 65. EXAM: Cardiac/Coronary  CT TECHNIQUE: The patient was scanned on a [employer]. FINDINGS: A 120 kV prospective  scan was triggered in the descending thoracic aorta at 111 HU's. Axial non-contrast 3 mm slices were carried out through the heart. The data set was analyzed on a dedicated work station and scored using the Agatson method. Gantry rotation speed was 250 msecs and collimation was .6 mm. No beta blockade and 0.4 mg of sl NTG was given. The 3D data set was reconstructed in 5% intervals of the 67-82 % of the R-R cycle. Diastolic phases were analyzed on a dedicated work station using MPR, MIP and VRT modes. The patient received 80 cc of contrast. Aorta:  Normal size.  No calcifications.  No dissection. Aortic Valve:  Trileaflet.  No calcifications. Coronary Arteries:  Normal coronary origin.  Left dominance. Left main is a large artery that gives rise to LAD and LCX arteries. Left main has no significant stenosis. LAD is a large vessel that is severely diffusely diseased. There is a complex, long severe, predominantly non-calcified plaque in the proximal LAD with stenoses > 70%, this lesion has high risks features. Mid LAD has severe diffuse mostly calcified plaque with a focal stenosis of at least 50-69% but possibly > 70%, distal LAD has small lumen and diffuse plaque. D1 is severely diffusely diseased. LCX is large dominant artery that gives rise to two OM branches, PDA and PLA. There is mild non-calcified plaque in the proximal portion with stenosis 25-50%. OM1 has no significant stenosis. OM2 has ostial 50-69%. PDA is poorly visualized and has possible severe stenosis in the ostial portion. RCA is a small non-dominant artery that is diffusely diseased and occluded in the proximal portion. Other findings: Normal pulmonary vein drainage into the left atrium. Normal let atrial appendage without a thrombus. IMPRESSION: 1. Coronary calcium score of 259. This was 60 percentile for age and sex matched control. 2. Normal coronary origin with left dominance. 3. Study quality affected by patient's size and suboptimal  vasodilation. However, there is a severe high risk plaque in the proximal LAD with stenosis > 70% and possible severe stenosis in the mid LAD and first diagonal artery. A cardiac catheterization is recommended. 4. Dilated pulmonary artery measuring 34 mm suggestive of pulmonary hypertension. Electronically Signed: By: Tobias Alexander On: 11/14/2017 17:59    Cardiac Studies   Coronary CT morph 11/14/2017 IMPRESSION: 1. Coronary calcium score of 259. This was 28 percentile for age and sex matched control.  2. Normal coronary origin with left dominance.  3. Study quality affected by patient's size and suboptimal vasodilation. However, there is a severe high risk plaque in the proximal LAD with stenosis > 70% and possible  severe stenosis in the mid LAD and first diagonal artery. A cardiac catheterization is recommended.  4. Dilated pulmonary artery measuring 34 mm suggestive of pulmonary Hypertension.  Cardiac cath 11/15/2017 Conclusion     There is moderate left ventricular systolic dysfunction. The left ventricular ejection fraction is 35-45% by visual estimate.  LV end diastolic pressure is mildly elevated.  -----------------------------------------  ANGIOGRAPHY- PCI  LESION #1: Mid LAD lesion is 80% stenosed.  A drug-eluting stent was successfully placed using a STENT SYNERGY DES 2.25X16. -Postdilated to 2.4 mm  Post intervention, there is a 0% residual stenosis.  LESION #2: mid LAD to Dist LAD lesion is 90% stenosed.  A drug-eluting stent was successfully placed using a STENT SYNERGY DES 2.25X12. -Postdilated to 2.4 mm  Post intervention, there is a 0% residual stenosis.  Prox LAD lesion is 40% stenosed -prior to lesion 1; apical, after lesion 2  A drug-eluting stent was successfully placed using a STENT SYNERGY DES 2.25X12. -Postdilated to 2.4 mm  -----------------------------------------  Post intervention, there is a 0% residual stenosis.  Trifurcation 90%  lesion at the distal circumflex into LPL and PDA -small caliber distal vessels. Best treated medically as this is not a very good PCI target  Prox small caliber nondominant RCA lesion is 99% stenosed.   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   Subtotally occluded nondominant RCA Moderately reduced LVEF of roughly 40% with mildly elevated LVEDP.  Plan: Transfer to postprocedure unit for post PCI monitoring and TR removal Continue aggressive risk factor modification with statin and beta-blocker etc.  Recommend uninterrupted dual antiplatelet therapy with Aspirin 81mg  daily and Ticagrelor 90mg  twice daily for a minimum of 12 months (ACS - Class I recommendation). -Would strongly consider reducing to 60 mg dose ticagrelor at 9-12 months and continue for an additional year.   Would be okay to stop aspirin after 3 months if necessary.  Okay to stop Brilinta after 6 months for procedures if necessary.     Patient Profile     47 y.o. male with a remote history of minimal CAD by cath in 2014, hypertension, hyperlipidemia, diabetes, ongoing tobacco abuse, GERD admitted with CP  Assessment & Plan    1.Chest pain/ASCAD -Coronary CTA with high grade LAD lesion --Cath yesterday showed 80% mid LAD and 90% mid to distal LAD status post drug-eluting stents, trifurcation 90% lesion at the distal left circumflex and PDA small-caliber distal vessels not good target for PCI.  Proximal small caliber nondominant RCA lesion 99% stenosis again not adequate for PCI and 80% distal OM 2 lesion status post DES. --Medical management of nondominant proximal RCA stenosis and distal left circumflex/LPL/L PDA disease --Continue ASA 81mg  daily and Brilinta 90 mg twice daily --Add Imdur 30 mg daily --no statin due to h/o rhabdo with Crestor --no BB due to bradycardia --Patient  should not be on Pepcid while on Brilinta so if patient needs PPI with switch to Protonix  2.  HTN - BP stable at 127/69 mmHg -Continue amlodipine 2.5mg  daily   3.  Hyperlipidemia --Lipid panel pending --LDL goal < 70 --no statin due to Rhabo with crestor --We will refer to lipid clinic to consider PCSK 9i  4.  ASACAD --cardiac catheterization 2014  revealed 30% proximal and 40% mid stenosis of the LAD, 30% proximal OM1 and small nondominant diffusely diseased RCA  with normal LV function EF 55 to 65%. Management was recommended. --coronary CTA now with high grade LAD lesion --Yesterday as above status post PCI of the 80% mid LAD and 90% mid to distal LAD lesions, 80% distal OM 2 lesion now on DAPT with aspirin and Brilinta --no statin due to h/o rhabdo with Crestor  5.  Hypothyroidism --TSH 153 --now on synthroid  6.  Bradycardia --likely related to hypothyroidism --asymptomatic  [facility] will sign off.   Medication Recommendations: Aspirin 81 mg daily, Brilinta 90 mg twice daily, amlodipine 2.5 mg daily, Imdur 30 mg daily Other recommendations (labs, testing, etc): None Follow up as an outpatient: Follow-up in our office in 7 to 10 days for transitional care appointment with extension.  Set up for lipid clinic for PCSK 9 inhibitor  For questions or updates, please contact [facility] Please consult [email or web address] for contact info under Cardiology/STEMI.      Signed, Armanda Magic, MD  11/16/2017, 7:30 AM

## 2017-11-16 NOTE — Progress Notes (Signed)
CARDIAC REHAB PHASE I   PRE:  Rate/Rhythm: 70 SR    BP: sitting 122/73    SaO2:   MODE:  Ambulation: 420 ft   POST:  Rate/Rhythm: 86 SR    BP: sitting 138/84     SaO2:   Tolerated well, feels much better. Sts he hasn't been able to walk far without SOB for a year. Also has had no energy for a year. Pt very receptive to education. He is motivated to make changes. Understands need for Brilinta/ASA and other meds. Was smoking 1/2 ppd PTA and now is planning to quit cold [place]. Declined fake cigarette, accepted resources and counseling. Has been working on his DM, his A1C is lower. Gave him tips for continued work and decreasing sodium. Discussed exercise, NTG, and CRPII. Will send referral to [facility] CRP[facility].   [id number] Harriet Masson CES, ACSM 11/16/2017 9:11 AM

## 2017-11-16 NOTE — Discharge Summary (Signed)
Physician Discharge Summary  THEADOR JEZEWSKI RKY:[id number] DOB: 22-Jul-1970 DOA: 11/13/2017  PCP: Patient, No Pcp Per  Admit date: 11/13/2017 Discharge date: 11/16/2017  Admitted From: Home Disposition: Home   Recommendations for Outpatient Follow-up:  1. Follow up with PCP in 1-2 weeks 2. Restarted synthroid at modest dose for his size of 175mg daily. Recheck TFTs in 6 weeks and modify dose accordingly if (and only if) the patient was taking the medication. 3. Follow up DM management, started amaryl due to pt preference to avoid insulin. 4. Follow up with cardiology and lipid clinic (h/o rhabdomyolysis related at statin)  [facility] Equipment/Devices: None Discharge Condition: Stable CODE STATUS: Full Diet recommendation: Heart healthy - carb modified  Brief/Interim Summary: CPATSY VARMAis a 47y.o. male with a history of tobacco use, HTN, IDT2DM, HLD, +FH CAD, known LAD plaque, and hypothyroidism who stopped taking all medications many months ago [facility] central chest pain associated with dyspnea, diaphoresis, and sensation of left arm numbness. ASA, NTG given with improvement in symptoms. CXR, d-dimer, initial troponin were negative and ECG did not show ischemic changes. TSH found to be 84.9. On review of systems, pt confirms feeling weak, fatigued with dry skin. He was brought in for ACS evaluation, cardiology consultation. ECG showed sinus bradycardia and troponins remained negative. Coronary CT showed high risk LAD lesion for which catheterization with DES placement (see below) was performed 9/5.  Discharge Diagnoses:  Principal Problem:   Chest pain Active Problems:   Tobacco abuse   Hypertension   Hyperlipidemia   Hypothyroidism   Type 2 diabetes mellitus without complications (HCC)   CKD (chronic kidney disease)   Chest pain with moderate risk for cardiac etiology   Chest pain due to CAD   Abnormal cardiac CT angiography    Coronary artery disease  Chest pain, CAD s/p PCI 11/15/2017:  - Bradycardic, so will not start beta blocker - No statin due to history of rhabdomyolysis with crestor. Lipid clinic referral.  LESION #1: Mid LAD lesion is 80% stenosed.  A drug-eluting stent was successfully placed using a STENT SYNERGY DES 2.25X16. -Postdilated to 2.4 mm  Post intervention, there is a 0% residual stenosis.  LESION #2: mid LAD to Dist LAD lesion is 90% stenosed.  A drug-eluting stent was successfully placed using a STENT SYNERGY DES 2.25X12. -Postdilated to 2.4 mm  Post intervention, there is a 0% residual stenosis.  Prox LAD lesion is 40% stenosed -prior to lesion 1; apical, after lesion 2  A drug-eluting stent was successfully placed using a STENT SYNERGY DES 2.25X12. -Postdilated to 2.4 mm  -----------------------------------------  Post intervention, there is a 0% residual stenosis.  Trifurcation 90% lesion at the distal circumflex into LPL and PDA -small caliber distal vessels. Best treated medically as this is not a very good PCI target  Prox small caliber nondominant RCA lesion is 99% stenosed.  - Recommend uninterrupted dual antiplatelet therapy with Aspirin 833mdaily and Ticagrelor 9080mwice daily for a minimum of 12 months (ACS - Class I recommendation).  - Would strongly consider reducing to 60 mg dose ticagrelor at 9-12 months and continue for an additional year.   - Would be okay to stop aspirin after 3 months if necessary.  Okay to stop Brilinta after 6 months for procedures if necessary.  Hypothyroidism: Untreated. TSH 84 (was >100 in the past). This is clearly symptomatically bothering the patient.  - Restarted synthroid at  modest dose for his size of 128mg daily. Recheck TFTs in 6 weeks and modify dose accordingly if (and only if) the patient was taking the medication.  HTN:  - Norvasc, imdur  Tobacco use: ~1/2 ppd PTA. Planning on quitting - Cessation counseling provided.  Declines resources at this time.   T2DM: Formerly on insulin, HbA1c 9.1%, poorly controlled with hyperglycemia. Suspect he does not require the doses that he was prescribed as he was not taking them and CBGs are more easily controlled here.  - Continue SSI - Diabetes coordinator follow up appreciated. Will prescribe [facility] (glimepiride to reduce frequency of dosing from previously prescribed glipizide) based on needs as inpatient. Pt amenable to po med. Not starting metformin due to h/o GI SE's from medications. Consider restarting at low dose in isolation.  Pyuria, leukocytosis: Urine culture has not returned results and pt has no symptoms. Will not start empiric treatment.  Medical nonadherence: States he was taking all his pills which made him sick. Instead of taking fewer pills he stopped them all. Pt was getting more side effects, so stopped taking meds, then provider increased doses due to worsening lab abnormalities, so side effects worsened, etc.  - This was discussed at length as I believe the patient would likely take medications if he were able to negotiate with providers regarding which are essential and minimizing less high-yield medications.   Note, I don't believe the patient has CKD, but elevated creatinine due to body habitus. CG equation suggests his current CrCl is >1057mmin.   Discharge Instructions Discharge Instructions    Amb Referral to Cardiac Rehabilitation   Complete by:  As directed    Diagnosis:   Coronary Stents PTCA     Diet - low sodium heart healthy   Complete by:  As directed    Diet Carb Modified   Complete by:  As directed    Discharge instructions   Complete by:  As directed    You were admitted for chest pain that was likely due to coronary artery disease (blockages in the arteries around the heart). This was treated with stents and you are stable for discharge with the following recommendations:  - Take aspirin and brilinta as directed to reduce the  risk of those stents getting clots in them.  - Take norvasc and imdur to reduce your blood pressure.  - Take synthroid everyday to treat low thyroid. - Take glimepiride for diabetes  - Because you are taking brilinta, you should only take protonix for reflux symptoms (not zantac of pepcid). A new prescription was sent to your pharmacy for you to take if you need to. - You have taken all these medications while in the hospital without evidence that they make you sick, but you will need to follow up with your doctor closely and let them know any effects you are feeling from the medications.  - Follow up with cardiology as scheduled - Return for care if you have chest pain or trouble breathing.   Increase activity slowly   Complete by:  As directed      Allergies as of 11/16/2017      Reactions   Bee Venom Swelling   Crestor [rosuvastatin] Other (See Comments)   rhabdomyolosis      Medication List    STOP taking these medications   amoxicillin 500 MG capsule Commonly known as:  AMOXIL   gemfibrozil 600 MG tablet Commonly known as:  LOPID   glipiZIDE 10 MG tablet Commonly known  as:  GLUCOTROL   insulin NPH-regular Human (70-30) 100 UNIT/ML injection Commonly known as:  NOVOLIN 70/30   metFORMIN 1000 MG tablet Commonly known as:  GLUCOPHAGE   ranitidine 150 MG tablet Commonly known as:  ZANTAC     TAKE these medications   amLODipine 2.5 MG tablet Commonly known as:  NORVASC Take 1 tablet (2.5 mg total) by mouth daily. Start taking on:  11/17/2017   aspirin 81 MG EC tablet Take 1 tablet (81 mg total) by mouth daily. Start taking on:  11/17/2017   Blood Glucose Monitoring Suppl w/Device Kit Dispense based on patient and insurance preference. Check blood sugar daily before breakfast (ICD9 250.0)   glimepiride 2 MG tablet Commonly known as:  AMARYL Take 1 tablet (2 mg total) by mouth daily with breakfast.   isosorbide mononitrate 30 MG 24 hr tablet Commonly known as:   IMDUR Take 1 tablet (30 mg total) by mouth daily. Start taking on:  11/17/2017   levothyroxine 125 MCG tablet Commonly known as:  SYNTHROID, LEVOTHROID Take 1 tablet (125 mcg total) by mouth daily before breakfast.   nitroGLYCERIN 0.4 MG SL tablet Commonly known as:  NITROSTAT Place 0.4 mg under the tongue every 5 (five) minutes as needed for chest pain.   pantoprazole 20 MG tablet Commonly known as:  PROTONIX Take 1 tablet (20 mg total) by mouth daily.   ticagrelor 90 MG Tabs tablet Commonly known as:  BRILINTA Take 1 tablet (90 mg total) by mouth 2 (two) times daily.      Follow-up [place] Follow up on 11/26/2017.   Why:  4 pm for hospital follow up and medication ast with brilinta, please bring proof of income, your discharge papers,  photo id, and arrive 15 minutes early Contact information: [place] [address] [phone]        Arnoldo Lenis, MD Follow up on 11/21/2017.   Specialty:  Cardiology Why:  Your follow up appointment will be on 11/21/17 at 1pm.  Contact information: [place] [address] [phone]          Allergies  Allergen Reactions  . Bee Venom Swelling  . Crestor [Rosuvastatin] Other (See Comments)    rhabdomyolosis    Consultations:  Cardiology  Procedures/Studies: Dg Chest 2 View  Result Date: 11/13/2017 CLINICAL DATA:  Chest pain, shortness of breath, chest heaviness EXAM: CHEST - 2 VIEW COMPARISON:  04/28/2017 FINDINGS: Normal heart size, mediastinal contours, and pulmonary vascularity. Lungs clear. No pleural effusion or pneumothorax. Bones unremarkable. IMPRESSION: Normal exam. Electronically Signed   By: Lavonia Dana M.D.   On: 11/13/2017 16:12   Ct Coronary Morph W/cta Cor W/score W/ca W/cm &/or Wo/cm  Addendum Date: 11/15/2017   ADDENDUM REPORT: 11/15/2017 08:14 EXAM: OVER-READ INTERPRETATION  CT CHEST The following report is an over-read performed  by radiologist Dr. Eben Burow [facility] on 11/15/2017. This over-read does not include interpretation of cardiac or coronary anatomy or pathology. The cardiac/coronary CT interpretation by the cardiologist is attached. COMPARISON:  None. FINDINGS: The visualized portions of the lower lung fields show no suspicious nodules, masses, or infiltrates. The visualized portions of the mediastinum and chest wall are unremarkable. IMPRESSION: No significant non-cardiac abnormality in visualized portion of the thorax. Electronically Signed   By: Earle Gell M.D.   On: 11/15/2017 08:14   Result Date: 11/15/2017 CLINICAL DATA:  47 year old-male with known non-obstructive CAD on a cardiac  catheterization in 2014, calcium score at the time 65. EXAM: Cardiac/Coronary  CT TECHNIQUE: The patient was scanned on a [employer]. FINDINGS: A 120 kV prospective scan was triggered in the descending thoracic aorta at 111 HU's. Axial non-contrast 3 mm slices were carried out through the heart. The data set was analyzed on a dedicated work station and scored using the [place]. Gantry rotation speed was 250 msecs and collimation was .6 mm. No beta blockade and 0.4 mg of sl NTG was given. The 3D data set was reconstructed in 5% intervals of the 67-82 % of the R-R cycle. Diastolic phases were analyzed on a dedicated work station using MPR, MIP and VRT modes. The patient received 80 cc of contrast. Aorta:  Normal size.  No calcifications.  No dissection. Aortic Valve:  Trileaflet.  No calcifications. Coronary Arteries:  Normal coronary origin.  Left dominance. Left main is a large artery that gives rise to LAD and LCX arteries. Left main has no significant stenosis. LAD is a large vessel that is severely diffusely diseased. There is a complex, long severe, predominantly non-calcified plaque in the proximal LAD with stenoses > 70%, this lesion has high risks features. Mid LAD has severe diffuse mostly calcified  plaque with a focal stenosis of at least 50-69% but possibly > 70%, distal LAD has small lumen and diffuse plaque. D1 is severely diffusely diseased. LCX is large dominant artery that gives rise to two OM branches, PDA and PLA. There is mild non-calcified plaque in the proximal portion with stenosis 25-50%. OM1 has no significant stenosis. OM2 has ostial 50-69%. PDA is poorly visualized and has possible severe stenosis in the ostial portion. RCA is a small non-dominant artery that is diffusely diseased and occluded in the proximal portion. Other findings: Normal pulmonary vein drainage into the left atrium. Normal let atrial appendage without a thrombus. IMPRESSION: 1. Coronary calcium score of 259. This was 31 percentile for age and sex matched control. 2. Normal coronary origin with left dominance. 3. Study quality affected by patient's size and suboptimal vasodilation. However, there is a severe high risk plaque in the proximal LAD with stenosis > 70% and possible severe stenosis in the mid LAD and first diagonal artery. A cardiac catheterization is recommended. 4. Dilated pulmonary artery measuring 34 mm suggestive of pulmonary hypertension. Electronically Signed: By: Ena Dawley On: 11/14/2017 17:59     There is moderate left ventricular systolic dysfunction. The left ventricular ejection fraction is 35-45% by visual estimate.  LV end diastolic pressure is mildly elevated.  -----------------------------------------  ANGIOGRAPHY- PCI  LESION #1: Mid LAD lesion is 80% stenosed.  A drug-eluting stent was successfully placed using a STENT SYNERGY DES 2.25X16. -Postdilated to 2.4 mm  Post intervention, there is a 0% residual stenosis.  LESION #2: mid LAD to Dist LAD lesion is 90% stenosed.  A drug-eluting stent was successfully placed using a STENT SYNERGY DES 2.25X12. -Postdilated to 2.4 mm  Post intervention, there is a 0% residual stenosis.  Prox LAD lesion is 40% stenosed -prior to  lesion 1; apical, after lesion 2  A drug-eluting stent was successfully placed using a STENT SYNERGY DES 2.25X12. -Postdilated to 2.4 mm  -----------------------------------------  Post intervention, there is a 0% residual stenosis.  Trifurcation 90% lesion at the distal circumflex into LPL and PDA -small caliber distal vessels. Best treated medically as this is not a very good PCI target  Prox small caliber nondominant RCA lesion is 99% stenosed.  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   Subtotally occluded nondominant RCA Moderately reduced LVEF of roughly 40% with mildly elevated LVEDP.  Plan: Transfer to postprocedure unit for post PCI monitoring and TR removal Continue aggressive risk factor modification with statin and beta-blocker etc.  Recommend uninterrupted dual antiplatelet therapy with Aspirin 32m daily and Ticagrelor 961mtwice daily for a minimum of 12 months (ACS - Class I recommendation). -Would strongly consider reducing to 60 mg dose ticagrelor at 9-12 months and continue for an additional year.   Would be okay to stop aspirin after 3 months if necessary.  Okay to stop Brilinta after 6 months for procedures if necessary.  Subjective: Feels he can already walk farther without dyspnea. No chest pain. Does have an abnormal sensation in the left lower chest described as intermittent feeling of a finger touching the inside of his chest wall from the inside where his heart is. It is not pain, goes away on its own and when he rubs his chest wall, and is not particularly bothersome, but wonders what it is. No bleeding.  Discharge Exam: Vitals:   11/16/17 0249 11/16/17 0808  BP: 127/69 122/73  Pulse: 71 65  Resp: 15 19  Temp: 98.4 F (36.9 C) 98.1 F (36.7 C)  SpO2: 95% 91%   General: Pt is alert, awake, not in acute  distress Cardiovascular: RRR, S1/S2 +, no rubs, no gallops Respiratory: CTA bilaterally, no wheezing, no rhonchi Abdominal: Soft, NT, ND, bowel sounds + Extremities: No edema, no cyanosis  Labs: BNP (last 3 results) No results for input(s): BNP in the last 8760 hours. Basic Metabolic Panel: Recent Labs  Lab 11/13/17 1729 11/14/17 0107 11/15/17 0415 11/16/17 0239  NA 136  --  138 135  K 3.5  --  4.0 3.8  CL 101  --  104 101  CO2 27  --  25 25  GLUCOSE 241*  --  168* 176*  BUN 18  --  18 13  CREATININE 1.55* 1.41* 1.41* 1.27*  CALCIUM 9.4  --  9.2 9.3   Liver Function Tests: No results for input(s): AST, ALT, ALKPHOS, BILITOT, PROT, ALBUMIN in the last 168 hours. No results for input(s): LIPASE, AMYLASE in the last 168 hours. No results for input(s): AMMONIA in the last 168 hours. CBC: Recent Labs  Lab 11/13/17 1729 11/14/17 0107 11/15/17 0415 11/16/17 0239  WBC 12.8* 13.1* 12.4* 13.1*  HGB 14.1 13.8 12.8* 13.4  HCT 42.5 42.4 39.1 40.5  MCV 91.6 91.6 91.4 90.0  PLT 238 219 222 236   Cardiac Enzymes: Recent Labs  Lab 11/14/17 0107 11/14/17 1221  TROPONINI <0.03 <0.03   BNP: Invalid input(s): POCBNP CBG: Recent Labs  Lab 11/15/17 0808 11/15/17 1141 11/15/17 1745 11/15/17 2013 11/16/17 0610  GLUCAP 161* 150* 116* 180* 182*   D-Dimer Recent Labs    11/13/17 1729  DDIMER 0.35   Hgb A1c Recent Labs    11/13/17 1729  HGBA1C 9.1*   Lipid Profile No results for input(s): CHOL, HDL, LDLCALC, TRIG, CHOLHDL, LDLDIRECT in the last 72 hours. Thyroid function studies Recent Labs    11/13/17 1729  TSH 84.910*   Anemia work up No results for input(s): VITAMINB12, FOLATE, FERRITIN, TIBC, IRON, RETICCTPCT in the last 72 hours. Urinalysis    Component Value Date/Time   COLORURINE AMBER (A) 11/13/2017  2006   APPEARANCEUR TURBID (A) 11/13/2017 2006   LABSPEC 1.026 11/13/2017 2006   PHURINE 6.0 11/13/2017 2006   GLUCOSEU 50 (A) 11/13/2017 2006   HGBUR  NEGATIVE 11/13/2017 2006   [place] NEGATIVE 11/13/2017 2006   [place] NEGATIVE 11/13/2017 2006   PROTEINUR 30 (A) 11/13/2017 2006   UROBILINOGEN 0.2 10/06/2013 0305   NITRITE NEGATIVE 11/13/2017 2006   LEUKOCYTESUR NEGATIVE 11/13/2017 2006    Microbiology No results found for this or any previous visit (from the past 240 hour(s)).  Time coordinating discharge: Approximately 40 minutes  Patrecia Pour, MD  [facility] 11/16/2017, 9:58 AM Pager [phone]

## 2017-11-16 NOTE — Care Management Note (Signed)
Case Management Note  Patient Details  Name: Devin [name] MRN: [id number] Date of Birth: Jun 30, 1970  Subjective/Objective:    From home, pta indep, s/p stent intervention, will be on brilinta, he has no insurance , no PCP,  NCM scheduled a follow up apt for patient at the [facility] for 9/16 at 4 pm,  He will need to take the patient ast application to this apt with him , NCM gave patient a Match letter to assist with his other medications.  Nere RN will give him a 30 day free coupon for his first 30 days free of the brilinta.  NCM asked patient is he understood information, he said yes.                  Action/Plan: DC home when ready.  Expected Discharge Date:                  Expected Discharge Plan:  Home/Self Care  In-House Referral:     Discharge planning Services  CM Consult, MATCH Program, [facility], Follow-up appt scheduled, Medication Assistance  Post Acute Care Choice:    Choice offered to:     DME Arranged:    DME Agency:     HH Arranged:    HH Agency:     Status of Service:  Completed, signed off  If discussed at [employer], dates discussed:    Additional Comments:  Leone Haven, RN 11/16/2017, 8:32 AM

## 2017-11-17 LAB — URINE CULTURE: Culture: NO GROWTH

## 2017-11-21 ENCOUNTER — Ambulatory Visit (INDEPENDENT_AMBULATORY_CARE_PROVIDER_SITE_OTHER): Payer: Self-pay | Admitting: Cardiology

## 2017-11-21 ENCOUNTER — Encounter: Payer: Self-pay | Admitting: Cardiology

## 2017-11-21 VITALS — BP 154/88 | HR 87 | Ht 78.0 in | Wt 294.0 lb

## 2017-11-21 DIAGNOSIS — E785 Hyperlipidemia, unspecified: Secondary | ICD-10-CM

## 2017-11-21 DIAGNOSIS — I2510 Atherosclerotic heart disease of native coronary artery without angina pectoris: Secondary | ICD-10-CM

## 2017-11-21 MED ORDER — AMLODIPINE BESYLATE 5 MG PO TABS: 5.0000 mg | ORAL_TABLET | Freq: Every day | ORAL | 3 refills | Status: DC

## 2017-11-21 NOTE — Patient Instructions (Addendum)
Medication Instructions:  Stop IMDUR   INCREASE NORVASC 5 MG DAILY   Labwork: FASTING LIPID   Testing/Procedures: Your physician has requested that you have an echocardiogram. Echocardiography is a painless test that uses sound waves to create images of your heart. It provides your doctor with information about the size and shape of your heart and how well your heart's chambers and valves are working. This procedure takes approximately one hour. There are no restrictions for this procedure.   Follow-Up: Your physician recommends that you schedule a follow-up appointment in: 3 MONTHS    Any Other Special Instructions Will Be Listed Below (If Applicable).   [phone] FREE CLINIC (CLOSED ON FRIDAYS)   [phone] HEALTH DEPT.   If you need a refill on your cardiac medications before your next appointment, please call your pharmacy.

## 2017-11-21 NOTE — Progress Notes (Signed)
Clinical Summary Mr. Auman is a 47 y.o.male seen today for follow up of the following medical problems.    1. CAD/Chest pain - admitted 11/2017 with chest pain concerning for unstable angina.  11/2017 coronary CTA with severe prox LAD disease and possible mid LAD disease and D1. 11/2017 cath LM patent, LAD mid 80 to 90%, LCX distal 90%, OM2 90%, LPDA 90%, RCA small with prox 99% LVgram 35-45% (no echo on file). DES x 2 to LAD, DES to OM,  trifurcation lesion distal LCX, LPL and PDA recs for medical therapy, poor pci target. RCA small vessel treated medically.  - bradycardia on beta blocketers. History of rhabdo on crestor.   - mild chest pain since discharge first day after discharge but has since resolved - ongoing headache on imdur.  - compliant with meds. Occasional SOB. Can have some palpitations. Less frequent.   2. Hyperlipidemia - history of rhabdo on crestor - hospital noted indicate he was to be referred to lipid clinc - dont' see recent lipid panel in system   3. Hypothyroidism - TSH 153 on admission, on synthroid.   Past Medical History:  Diagnosis Date  . Asthma   . Chest pain   . Coronary artery disease   . Dyspnea   . GERD (gastroesophageal reflux disease)   . History of kidney stones   . History of rhabdomyolysis   . Hyperlipidemia   . Hypertension   . Hypothyroidism   . Kidney calculus 2014  . Pericardial effusion    Small, by dobutamine echocardiogram, 04/2006  . Tobacco abuse   . Type 2 diabetes mellitus without complications ([place]) 2/37/6283  . Vitamin D deficiency      Allergies  Allergen Reactions  . Bee Venom Swelling  . Crestor [Rosuvastatin] Other (See Comments)    rhabdomyolosis     Current Outpatient Medications  Medication Sig Dispense Refill  . amLODipine (NORVASC) 2.5 MG tablet Take 1 tablet (2.5 mg total) by mouth daily. 30 tablet 0  . aspirin EC 81 MG EC tablet Take 1 tablet (81 mg total) by mouth daily. 30 tablet 0  . Blood  Glucose Monitoring Suppl w/Device KIT Dispense based on patient and insurance preference. Check blood sugar daily before breakfast (ICD9 250.0) 1 each 0  . glimepiride (AMARYL) 2 MG tablet Take 1 tablet (2 mg total) by mouth daily with breakfast. 30 tablet 0  . isosorbide mononitrate (IMDUR) 30 MG 24 hr tablet Take 1 tablet (30 mg total) by mouth daily. 30 tablet 0  . levothyroxine (SYNTHROID, LEVOTHROID) 125 MCG tablet Take 1 tablet (125 mcg total) by mouth daily before breakfast. 30 tablet 0  . nitroGLYCERIN (NITROSTAT) 0.4 MG SL tablet Place 0.4 mg under the tongue every 5 (five) minutes as needed for chest pain.    . pantoprazole (PROTONIX) 20 MG tablet Take 1 tablet (20 mg total) by mouth daily. 30 tablet 0  . ticagrelor (BRILINTA) 90 MG TABS tablet Take 1 tablet (90 mg total) by mouth 2 (two) times daily. 60 tablet 0   No current facility-administered medications for this visit.      Past Surgical History:  Procedure Laterality Date  . 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 CHOLECYSTECTOMY N/A 01/27/2013   Procedure: LAPAROSCOPIC CHOLECYSTECTOMY;  Surgeon: Jamesetta So, MD;  Location: [facility];  Service: General;  Laterality: N/A;  . CORONARY STENT INTERVENTION N/A  11/15/2017   Procedure: CORONARY STENT INTERVENTION;  Surgeon: Leonie Man, MD;  Location: [facility];  Service: Cardiovascular;  Laterality: N/A;  . HERNIA REPAIR     As a child  . INSERTION OF MESH N/A 11/13/2012   Procedure: INSERTION OF MESH;  Surgeon: Jamesetta So, MD;  Location: [facility];  Service: General;  Laterality: N/A;  . KIDNEY STONE SURGERY    . LEFT HEART CATH AND CORONARY ANGIOGRAPHY N/A 11/15/2017   Procedure: LEFT HEART CATH AND CORONARY ANGIOGRAPHY;  Surgeon: Leonie Man, MD;  Location: [facility];  Service: Cardiovascular;  Laterality: N/A;  . PERCUTANEOUS NEPHROLITHOTRIPSY    . UMBILICAL HERNIA  REPAIR N/A 11/13/2012   Procedure: UMBILICAL HERNIORRHAPHY;  Surgeon: Jamesetta So, MD;  Location: [facility];  Service: General;  Laterality: N/A;  . VARICOCELECTOMY       Allergies  Allergen Reactions  . Bee Venom Swelling  . Crestor [Rosuvastatin] Other (See Comments)    rhabdomyolosis      Family History  Problem Relation Age of Onset  . Coronary artery disease Father   . Hypertension Father   . Hyperlipidemia Father   . Diabetes Father   . Congestive Heart Failure Father 37  . Arrhythmia Father        had an ICD  . Diabetes Mother   . Hypertension Mother   . Hyperlipidemia Mother   . Obesity Mother 50       died after bariatric surgery, liver failure and infection  . Coronary artery disease Maternal Grandfather        both grandfathers and several uncles  . Coronary artery disease Unknown   . Diabetes Sister        both sisters     Social History Mr. Frankie reports that he has been smoking cigarettes. He started smoking about 32 years ago. He has a 13.00 pack-year smoking history. He quit smokeless tobacco use about 30 years ago.  His smokeless tobacco use included snuff and chew. Mr. Grajales reports that he does not drink alcohol.   Review of Systems CONSTITUTIONAL: No weight loss, fever, chills, weakness or fatigue.  HEENT: Eyes: No visual loss, blurred vision, double vision or yellow sclerae.No hearing loss, sneezing, congestion, runny nose or sore throat.  SKIN: No rash or itching.  CARDIOVASCULAR: per hpi RESPIRATORY: per hpi GASTROINTESTINAL: No anorexia, nausea, vomiting or diarrhea. No abdominal pain or blood.  GENITOURINARY: No burning on urination, no polyuria NEUROLOGICAL: No headache, dizziness, syncope, paralysis, ataxia, numbness or tingling in the extremities. No change in bowel or bladder control.  MUSCULOSKELETAL: No muscle, back pain, joint pain or stiffness.  LYMPHATICS: No enlarged nodes. No history of splenectomy.  PSYCHIATRIC: No history of  depression or anxiety.  ENDOCRINOLOGIC: No reports of sweating, cold or heat intolerance. No polyuria or polydipsia.  Marland Kitchen   Physical Examination Vitals:   11/21/17 1258  BP: (!) 154/88  Pulse: 87   Vitals:   11/21/17 1258  Weight: 294 lb (133.4 kg)  Height: 6' 6"  (1.981 m)    Gen: resting comfortably, no acute distress HEENT: no scleral icterus, pupils equal round and reactive, no palptable cervical adenopathy,  CV: RRR, no m/r/g, no jvd Resp: Clear to auscultation bilaterally GI: abdomen is soft, non-tender, non-distended, normal bowel sounds, no hepatosplenomegaly MSK: extremities are warm, no edema.  Skin: warm, no rash Neuro:  no focal deficits Psych: appropriate affect   Diagnostic Studies  11/2017 cath  There is  moderate left ventricular systolic dysfunction. The left ventricular ejection fraction is 35-45% by visual estimate.  LV end diastolic pressure is mildly elevated.  -----------------------------------------  ANGIOGRAPHY- PCI  LESION #1: Mid LAD lesion is 80% stenosed.  A drug-eluting stent was successfully placed using a STENT SYNERGY DES 2.25X16. -Postdilated to 2.4 mm  Post intervention, there is a 0% residual stenosis.  LESION #2: mid LAD to Dist LAD lesion is 90% stenosed.  A drug-eluting stent was successfully placed using a STENT SYNERGY DES 2.25X12. -Postdilated to 2.4 mm  Post intervention, there is a 0% residual stenosis.  Prox LAD lesion is 40% stenosed -prior to lesion 1; apical, after lesion 2  A drug-eluting stent was successfully placed using a STENT SYNERGY DES 2.25X12. -Postdilated to 2.4 mm  -----------------------------------------  Post intervention, there is a 0% residual stenosis.  Trifurcation 90% lesion at the distal circumflex into LPL and PDA -small caliber distal vessels. Best treated medically as this is not a very good PCI target  Prox small caliber nondominant RCA lesion is 99% stenosed.   Severe distal OM 2 -DES  PCI with Synergy 2.25 mm x16 mm (2.4 mm); severe trifurcation disease with a very distal AV groove circumflex prior to PDA and PL Jaqualin Serpa -medical management.   Subtotally occluded nondominant RCA Moderately reduced LVEF of roughly 40% with mildly elevated LVEDP.  Plan: Transfer to postprocedure unit for post PCI monitoring and TR removal Continue aggressive risk factor modification with statin and beta-blocker etc.  Recommend uninterrupted dual antiplatelet therapy with Aspirin 60m daily and Ticagrelor 934mtwice daily for a minimum of 12 months (ACS - Class I recommendation). -Would strongly consider reducing to 60 mg dose ticagrelor at 9-12 months and continue for an additional year.   Would be okay to stop aspirin after 3 months if necessary.  Okay to stop Brilinta after 6 months for procedures if necessary.   Assessment and Plan   1. CAD - recent stenting as reported above - overall doing well. Initially some episodes of SOB that may have been triggered by brillinta. These are resolving, monitor for now but if ongoing issue would change to plavix - headache on imdur, will d/c and increase norvasc to 21m46maily  2. Hyperlipidemia - rhabdo on statins - needs repeat panel, may consider zetia vs pcsk9 inhibitor pending results. Very low thryoid may also affect lipids    F/u 3 months.     JonArnoldo Lenis.D.

## 2017-11-23 ENCOUNTER — Ambulatory Visit (HOSPITAL_COMMUNITY)
Admission: RE | Admit: 2017-11-23 | Discharge: 2017-11-23 | Disposition: A | Discharge status: Home / Self Care | Payer: Self-pay | Source: Ambulatory Visit | Attending: Cardiology | Admitting: Cardiology

## 2017-11-23 ENCOUNTER — Other Ambulatory Visit (HOSPITAL_COMMUNITY)
Admission: RE | Admit: 2017-11-23 | Discharge: 2017-11-23 | Disposition: A | Discharge status: Home / Self Care | Payer: Self-pay | Source: Ambulatory Visit | Attending: Cardiology | Admitting: Cardiology

## 2017-11-23 DIAGNOSIS — E785 Hyperlipidemia, unspecified: Secondary | ICD-10-CM | POA: Insufficient documentation

## 2017-11-23 DIAGNOSIS — I2510 Atherosclerotic heart disease of native coronary artery without angina pectoris: Secondary | ICD-10-CM | POA: Insufficient documentation

## 2017-11-23 LAB — LIPID PANEL
Cholesterol: 339 mg/dL — ABNORMAL HIGH (ref 0–200)
HDL: 29 mg/dL — AB (ref >40)
LDL CALC: UNDETERMINED mg/dL (ref 0–99)
TRIGLYCERIDES: 475 mg/dL — AB (ref <150)
Total CHOL/HDL Ratio: 11.7 RATIO
VLDL: UNDETERMINED mg/dL (ref 0–40)

## 2017-11-23 NOTE — Progress Notes (Signed)
*  PRELIMINARY RESULTS* Echocardiogram 2D Echocardiogram has been performed.  Stacey DrainWhite, Connelly [place] J 11/23/2017, 9:12 AM

## 2017-11-26 ENCOUNTER — Telehealth: Payer: Self-pay

## 2017-11-26 MED ORDER — SACUBITRIL-VALSARTAN 24-26 MG PO TABS: 1.0000 | ORAL_TABLET | Freq: Two times a day (BID) | ORAL | 3 refills | Status: DC

## 2017-11-26 NOTE — Telephone Encounter (Signed)
Called pt., no answer. Left message for pt to return call.  

## 2017-11-26 NOTE — Telephone Encounter (Signed)
-----   Message from Antoine PocheJonathan F Branch, MD sent at 11/26/2017 10:39 AM EDT ----- Echo shows heart muscle is weak,puming at 35-40% (normal is 50-60%). Hopefully with his stents and medications this will improve. Now that we know the pumping function is weak he would do better with stopping norvasc and starting entresto 24/26mg  bid, check BMET in 2 weeks.   Dominga FerryJ Branch MD

## 2017-11-26 NOTE — Telephone Encounter (Signed)
Spoke with pt's sister. Let her know results. Sent in entresto 24/26 bid to [facility] in [place] with 30 day free trial card. Will start pt assistance. Have samples for pt. Waiting in front office.

## 2017-11-30 ENCOUNTER — Other Ambulatory Visit: Payer: Self-pay

## 2017-11-30 ENCOUNTER — Emergency Department (HOSPITAL_COMMUNITY): Payer: Self-pay

## 2017-11-30 ENCOUNTER — Other Ambulatory Visit (HOSPITAL_COMMUNITY): Payer: Self-pay

## 2017-11-30 ENCOUNTER — Encounter (HOSPITAL_COMMUNITY): Payer: Self-pay

## 2017-11-30 ENCOUNTER — Inpatient Hospital Stay (HOSPITAL_COMMUNITY)
Admission: EM | Admit: 2017-11-30 | Discharge: 2017-12-05 | DRG: 038 | Disposition: A | Discharge status: Home / Self Care | Payer: Self-pay | Attending: Family Medicine | Admitting: Family Medicine

## 2017-11-30 DIAGNOSIS — F17200 Nicotine dependence, unspecified, uncomplicated: Secondary | ICD-10-CM | POA: Diagnosis present

## 2017-11-30 DIAGNOSIS — I651 Occlusion and stenosis of basilar artery: Principal | ICD-10-CM | POA: Diagnosis present

## 2017-11-30 DIAGNOSIS — G459 Transient cerebral ischemic attack, unspecified: Secondary | ICD-10-CM

## 2017-11-30 DIAGNOSIS — E039 Hypothyroidism, unspecified: Secondary | ICD-10-CM | POA: Diagnosis present

## 2017-11-30 DIAGNOSIS — I2510 Atherosclerotic heart disease of native coronary artery without angina pectoris: Secondary | ICD-10-CM | POA: Diagnosis present

## 2017-11-30 DIAGNOSIS — E669 Obesity, unspecified: Secondary | ICD-10-CM | POA: Diagnosis present

## 2017-11-30 DIAGNOSIS — Z833 Family history of diabetes mellitus: Secondary | ICD-10-CM

## 2017-11-30 DIAGNOSIS — E1165 Type 2 diabetes mellitus with hyperglycemia: Secondary | ICD-10-CM | POA: Diagnosis present

## 2017-11-30 DIAGNOSIS — I110 Hypertensive heart disease with heart failure: Secondary | ICD-10-CM | POA: Diagnosis present

## 2017-11-30 DIAGNOSIS — I6503 Occlusion and stenosis of bilateral vertebral arteries: Secondary | ICD-10-CM | POA: Diagnosis present

## 2017-11-30 DIAGNOSIS — Z7902 Long term (current) use of antithrombotics/antiplatelets: Secondary | ICD-10-CM

## 2017-11-30 DIAGNOSIS — Z955 Presence of coronary angioplasty implant and graft: Secondary | ICD-10-CM

## 2017-11-30 DIAGNOSIS — I10 Essential (primary) hypertension: Secondary | ICD-10-CM | POA: Diagnosis present

## 2017-11-30 DIAGNOSIS — I639 Cerebral infarction, unspecified: Secondary | ICD-10-CM

## 2017-11-30 DIAGNOSIS — F17210 Nicotine dependence, cigarettes, uncomplicated: Secondary | ICD-10-CM | POA: Diagnosis present

## 2017-11-30 DIAGNOSIS — Z6832 Body mass index (BMI) 32.0-32.9, adult: Secondary | ICD-10-CM

## 2017-11-30 DIAGNOSIS — E782 Mixed hyperlipidemia: Secondary | ICD-10-CM | POA: Diagnosis present

## 2017-11-30 DIAGNOSIS — I252 Old myocardial infarction: Secondary | ICD-10-CM

## 2017-11-30 DIAGNOSIS — E119 Type 2 diabetes mellitus without complications: Secondary | ICD-10-CM

## 2017-11-30 DIAGNOSIS — Z8349 Family history of other endocrine, nutritional and metabolic diseases: Secondary | ICD-10-CM

## 2017-11-30 DIAGNOSIS — E785 Hyperlipidemia, unspecified: Secondary | ICD-10-CM | POA: Diagnosis present

## 2017-11-30 DIAGNOSIS — Z87442 Personal history of urinary calculi: Secondary | ICD-10-CM

## 2017-11-30 DIAGNOSIS — Z7984 Long term (current) use of oral hypoglycemic drugs: Secondary | ICD-10-CM

## 2017-11-30 DIAGNOSIS — I6522 Occlusion and stenosis of left carotid artery: Secondary | ICD-10-CM | POA: Diagnosis present

## 2017-11-30 DIAGNOSIS — R079 Chest pain, unspecified: Secondary | ICD-10-CM

## 2017-11-30 DIAGNOSIS — Z7982 Long term (current) use of aspirin: Secondary | ICD-10-CM

## 2017-11-30 DIAGNOSIS — K219 Gastro-esophageal reflux disease without esophagitis: Secondary | ICD-10-CM | POA: Diagnosis present

## 2017-11-30 DIAGNOSIS — I6322 Cerebral infarction due to unspecified occlusion or stenosis of basilar arteries: Secondary | ICD-10-CM

## 2017-11-30 DIAGNOSIS — E1159 Type 2 diabetes mellitus with other circulatory complications: Secondary | ICD-10-CM

## 2017-11-30 DIAGNOSIS — R29700 NIHSS score 0: Secondary | ICD-10-CM | POA: Diagnosis present

## 2017-11-30 DIAGNOSIS — R42 Dizziness and giddiness: Secondary | ICD-10-CM

## 2017-11-30 DIAGNOSIS — Z720 Tobacco use: Secondary | ICD-10-CM | POA: Diagnosis present

## 2017-11-30 DIAGNOSIS — Z66 Do not resuscitate: Secondary | ICD-10-CM | POA: Diagnosis present

## 2017-11-30 DIAGNOSIS — R531 Weakness: Secondary | ICD-10-CM | POA: Diagnosis present

## 2017-11-30 DIAGNOSIS — I5022 Chronic systolic (congestive) heart failure: Secondary | ICD-10-CM | POA: Diagnosis present

## 2017-11-30 DIAGNOSIS — Z79890 Hormone replacement therapy (postmenopausal): Secondary | ICD-10-CM

## 2017-11-30 DIAGNOSIS — Z8249 Family history of ischemic heart disease and other diseases of the circulatory system: Secondary | ICD-10-CM

## 2017-11-30 DIAGNOSIS — R0789 Other chest pain: Secondary | ICD-10-CM | POA: Diagnosis present

## 2017-11-30 LAB — CBC
HCT: 40.8 % (ref 39.0–52.0)
Hemoglobin: 13.5 g/dL (ref 13.0–17.0)
MCH: 30.1 pg (ref 26.0–34.0)
MCHC: 33.1 g/dL (ref 30.0–36.0)
MCV: 90.9 fL (ref 78.0–100.0)
PLATELETS: 296 10*3/uL (ref 150–400)
RBC: 4.49 MIL/uL (ref 4.22–5.81)
RDW: 15.5 % (ref 11.5–15.5)
WBC: 10.7 10*3/uL — ABNORMAL HIGH (ref 4.0–10.5)

## 2017-11-30 LAB — POCT I-STAT TROPONIN I
TROPONIN I, POC: 0 ng/mL (ref 0.00–0.08)
Troponin i, poc: 0.01 ng/mL (ref 0.00–0.08)

## 2017-11-30 LAB — POCT I-STAT, CHEM 8
BUN: 24 mg/dL — AB (ref 6–20)
CALCIUM ION: 1.2 mmol/L (ref 1.15–1.40)
Chloride: 106 mmol/L (ref 98–111)
Creatinine, Ser: 1.3 mg/dL — ABNORMAL HIGH (ref 0.61–1.24)
GLUCOSE: 117 mg/dL — AB (ref 70–99)
HEMATOCRIT: 48 % (ref 39.0–52.0)
Hemoglobin: 16.3 g/dL (ref 13.0–17.0)
Potassium: 3.8 mmol/L (ref 3.5–5.1)
SODIUM: 140 mmol/L (ref 135–145)
TCO2: 23 mmol/L (ref 22–32)

## 2017-11-30 LAB — BASIC METABOLIC PANEL
Anion gap: 11 (ref 5–15)
BUN: 22 mg/dL — ABNORMAL HIGH (ref 6–20)
CALCIUM: 9.9 mg/dL (ref 8.9–10.3)
CO2: 23 mmol/L (ref 22–32)
CREATININE: 1.36 mg/dL — AB (ref 0.61–1.24)
Chloride: 106 mmol/L (ref 98–111)
Glucose, Bld: 127 mg/dL — ABNORMAL HIGH (ref 70–99)
Potassium: 3.9 mmol/L (ref 3.5–5.1)
SODIUM: 140 mmol/L (ref 135–145)

## 2017-11-30 LAB — DIFFERENTIAL
BASOS PCT: 0 %
Basophils Absolute: 0 10*3/uL (ref 0.0–0.1)
EOS PCT: 1 %
Eosinophils Absolute: 0.1 10*3/uL (ref 0.0–0.7)
LYMPHS PCT: 36 %
Lymphs Abs: 3.8 10*3/uL (ref 0.7–4.0)
MONO ABS: 0.6 10*3/uL (ref 0.1–1.0)
Monocytes Relative: 6 %
NEUTROS ABS: 6 10*3/uL (ref 1.7–7.7)
NEUTROS PCT: 57 %

## 2017-11-30 LAB — PROTIME-INR
INR: 1.02
Prothrombin Time: 13.3 seconds (ref 11.4–15.2)

## 2017-11-30 LAB — ETHANOL: Alcohol, Ethyl (B): <10 mg/dL (ref <10)

## 2017-11-30 LAB — APTT: APTT: 35 s (ref 24–36)

## 2017-11-30 MED ORDER — IOPAMIDOL (ISOVUE-370) INJECTION 76%: 150.0000 mL | Freq: Once | INTRAVENOUS | Status: DC | PRN

## 2017-11-30 MED ORDER — NITROGLYCERIN 0.4 MG SL SUBL
0.4000 mg | SUBLINGUAL_TABLET | Freq: Once | SUBLINGUAL | Status: AC
  Administered 2017-11-30: 0.4 mg via SUBLINGUAL
  Filled 2017-11-30: qty 1

## 2017-11-30 MED ORDER — IOPAMIDOL (ISOVUE-370) INJECTION 76%
150.0000 mL | Freq: Once | INTRAVENOUS | Status: AC | PRN
  Administered 2017-11-30: 150 mL via INTRAVENOUS

## 2017-11-30 MED ORDER — HYDROMORPHONE HCL 1 MG/ML IJ SOLN
1.0000 mg | Freq: Once | INTRAMUSCULAR | Status: AC
  Administered 2017-11-30: 1 mg via INTRAVENOUS
  Filled 2017-11-30: qty 1

## 2017-11-30 NOTE — ED Triage Notes (Signed)
Pt was work when chest pain and lt arm numbness coocured about an hour ago. Pt took 325x3 Aspirin. Pt states that he had stents placed about 3wks ago.

## 2017-11-30 NOTE — ED Notes (Signed)
Patient is being transported to CT. 

## 2017-11-30 NOTE — ED Provider Notes (Signed)
RN asked this provider to do a quick look on this patient in triage. Patient complains of crushing chest pain x2 hours with left arm paresthesias and pain. Patient reports taking 2 ASA prior to arrival unable to state how much. He continues to endorse chest pain and SOB. Objectively, patient is diaphoretic and is ill appearing. Cardiac and pulmonary exam unremarkable. At high risk for acute cardiac event, given his history of CAD and recent stent placement on 11/15/17. Per medical history, patient's current complaints are similar to complaints he had earlier this month prior to the stents.  Physical Exam: Constitutional: Alert. Patient is diaphoretic and ill appearing.  Cardio: RRR. S1 and S2 distinct. No murmurs appreciated. Pulm: Appears labored when speaking. Clear to auscultation bilaterally. No extraneous pulmonary sounds. Neuro: Able to follow commands. Decreased strength in left extremity.  Plan: Orders placed for Nitro 0.4mg  x1 for chest pain. Ordered chest pain work-up. RN transported patient to acute portion of ED.  Dagoberto LigasGabrielle Eusevio Schriver, PA-C 11/30/17 @ 7:38 PM   Lorice Lafave, Sharyon MedicusGabrielle I, PA-C 11/30/17 1938    Samuel JesterMcManus, Kathleen, DO 12/01/17 1557

## 2017-11-30 NOTE — Consult Note (Addendum)
[facility] Tele[facility]urology Consult Services   Date of Service:   11/30/2017 21:21:55  Impression:     .  Possible TIA. Sxs have resolved with NIH SS 0. Sxs don't seem to localize to one vascular territory so LVO seems clinically unlikely. TPA is not indicated given NIH SS 0. TPA window expires at 22:00. CTA head/neck are pending for LVO. ED physician has also ordered CTA to eval for aortic dissection  Metrics: [facility] Notification Time: 11/30/2017 21:20:51 Arrival Time: 12/01/2017 18:56:00 Stamp Time: 11/30/2017 21:21:55 Time First Login Attempt: 11/30/2017 21:31:29 Video Start Time: 11/30/2017 21:31:29  Symptoms: left arm paresthesia NIHSS Start Assessment Time: 11/30/2017 21:35:29 Patient is not a candidate for tPA. Patient was not deemed candidate for tPA thrombolytics because of sxs resolved .   ER physician notified of the decision on thrombolytics management.  Our recommendations are outlined below.  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 I called transfer center myself to discuss case with neuro IR.  Transfer center states ED already on phone for this transfer and usually transfers are initiated/facilitated by ED. Therefore, I will defer arrangements to  ED. ------------------------------------------------------------------------------  History of Present Illness: Patient is a 47 years old Male.  Patient was brought by EMS for symptoms of left arm paresthesia  47year old left handed man with CAD, CHF, DM, HLD, tobacco use presents for left UE paresthesia, chest pain/SOB since 17:30. He reports similar  sx in past right before his cardiac stent placement on earlier this month, for which he is on Brilinta. He denies associated sensory change in left face/UE, weakness of left UE. He feels "I couldn't get my thoughts together" but is vague on the nature of this, eventually stating that his "words weren't coming out right." He reports "dizzy spell with double vision" at work today with headache, he believes at the same the time. But these sxs had not ocurred in the past. He feels his sxs have resolved presently.  CT head was negative by my review.  Last seen normal was within 4.5 hours. There is no history of hemorrhagic complications or intracranial hemorrhage. There is no history of Recent Anticoagulants. There is no history of recent major surgery. There is no history of recent stroke.  Examination: 1A: Level of Consciousness - Alert; keenly responsive + 0 1B: Ask Month and Age - Both Questions Right + 0 1C: Blink Eyes & Squeeze Hands - Performs Both Tasks + 0 2: Test Horizontal Extraocular Movements - Normal + 0 3: Test Visual Fields - No Visual Loss + 0 4: Test Facial Palsy (Use Grimace if Obtunded) - Normal symmetry + 0 5A: Test Left Arm Motor Drift - No Drift for 10 Seconds + 0 5B: Test Right Arm Motor Drift - No Drift for 10 Seconds + 0 6A: Test Left Leg Motor Drift - No Drift for 5 Seconds + 0 6B: Test Right Leg Motor Drift - No Drift for 5 Seconds + 0 7: Test Limb Ataxia (FNF/Heel-Shin) - No Ataxia + 0 8: Test Sensation - Normal; No sensory loss + 0 9: Test Language/Aphasia - Normal; No aphasia + 0 10: Test Dysarthria - Normal + 0 11: Test Extinction/Inattention -  No abnormality + 0  NIHSS Score: 0  Patient was informed the Neurology Consult would happen via TeleHealth consult by way of interactive audio and video telecommunications and consented to receiving care in this manner.  Due to the immediate potential for life-threatening deterioration due to underlying acute  neurologic illness, I spent 35 minutes providing critical care. This time includes time for face to face visit via telemedicine, review of medical records, imaging studies and discussion of findings with providers, the patient and/or family.   Dr Bertell MariaMai Harolyn Cocker   [facility] [phone]

## 2017-11-30 NOTE — ED Notes (Signed)
Patient back from CT.

## 2017-11-30 NOTE — ED Provider Notes (Signed)
[facility] Emergency Department Provider Note MRN:  [id number]  Arrival date & time: 11/30/17     Chief Complaint   Chest Pain   History of Present Illness   DEMARKO ZEIMET is a 47 y.o. year-old male with a history of CAD status post stent placement presenting to the ED with chief complaint of chest pain.  Patient had 3 stents placed in this hospital 2 weeks ago.  Has been recovering well but 2 hours prior to arrival, experienced sudden onset left-sided chest pain rating down the left arm.  Pain similar to prior heart attacks.  Has been compliant with his medications.  Pain associated with dizziness, diaphoresis.  Pain is constant, 6 out of 10 in severity, described as a pressure.  Review of Systems  A complete 10 system review of systems was obtained and all systems are negative except as noted in the HPI and PMH.   Patient's Health History    Past Medical History:  Diagnosis Date  . Asthma   . Chest pain   . Coronary artery disease   . Dyspnea   . GERD (gastroesophageal reflux disease)   . History of kidney stones   . History of rhabdomyolysis   . Hyperlipidemia   . Hypertension   . Hypothyroidism   . Kidney calculus 2014  . Pericardial effusion    Small, by dobutamine echocardiogram, 04/2006  . Tobacco abuse   . Type 2 diabetes mellitus without complications (HCC) 10/07/2013  . Vitamin D deficiency     Past Surgical History:  Procedure Laterality Date  . 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 CHOLECYSTECTOMY N/A 01/27/2013   Procedure: LAPAROSCOPIC CHOLECYSTECTOMY;  Surgeon: Dalia Heading, MD;  Location: [facility];  Service: General;  Laterality: N/A;  . CORONARY STENT INTERVENTION N/A 11/15/2017   Procedure: CORONARY STENT INTERVENTION;  Surgeon: Marykay Lex, MD;  Location: [facility] INVASIVE CV LAB;  Service: Cardiovascular;  Laterality: N/A;  . HERNIA REPAIR     As  a child  . INSERTION OF MESH N/A 11/13/2012   Procedure: INSERTION OF MESH;  Surgeon: Dalia Heading, MD;  Location: [facility];  Service: General;  Laterality: N/A;  . KIDNEY STONE SURGERY    . LEFT HEART CATH AND CORONARY ANGIOGRAPHY N/A 11/15/2017   Procedure: LEFT HEART CATH AND CORONARY ANGIOGRAPHY;  Surgeon: Marykay Lex, MD;  Location: [facility] INVASIVE CV LAB;  Service: Cardiovascular;  Laterality: N/A;  . PERCUTANEOUS NEPHROLITHOTRIPSY    . UMBILICAL HERNIA REPAIR N/A 11/13/2012   Procedure: UMBILICAL HERNIORRHAPHY;  Surgeon: Dalia Heading, MD;  Location: [facility];  Service: General;  Laterality: N/A;  . VARICOCELECTOMY      Family History  Problem Relation Age of Onset  . Coronary artery disease Father   . Hypertension Father   . Hyperlipidemia Father   . Diabetes Father   . Congestive Heart Failure Father 61  . Arrhythmia Father        had an ICD  . Diabetes Mother   . Hypertension Mother   . Hyperlipidemia Mother   . Obesity Mother 35       died after bariatric surgery, liver failure and infection  . Coronary artery disease Maternal Grandfather        both grandfathers and several uncles  . Coronary artery disease Unknown   . Diabetes Sister        both  sisters    Social History   Socioeconomic History  . Marital status: Divorced    Spouse name: Not on file  . Number of children: Not on file  . Years of education: Not on file  . Highest education level: Not on file  Occupational History    Comment:      Employer: [employer]  Social Needs  . Financial resource strain: Not on file  . Food insecurity:    Worry: Not on file    Inability: Not on file  . Transportation needs:    Medical: Not on file    Non-medical: Not on file  Tobacco Use  . Smoking status: Current Some Day Smoker    Packs/day: 0.50    Years: 26.00    Pack years: 13.00    Types: Cigarettes    Start date: 03/13/1985  . Smokeless tobacco: Former NeurosurgeonUser    Types: Snuff, Chew    Quit date: 03/14/1987  .  Tobacco comment: started smoking at age 47. chewed and dipped for 2 years  Substance and Sexual Activity  . Alcohol use: No  . Drug use: No  . Sexual activity: Not on file  Lifestyle  . Physical activity:    Days per week: Not on file    Minutes per session: Not on file  . Stress: Not on file  Relationships  . Social connections:    Talks on phone: Not on file    Gets together: Not on file    Attends religious service: Not on file    Active member of club or organization: Not on file    Attends meetings of clubs or organizations: Not on file    Relationship status: Not on file  . Intimate partner violence:    Fear of current or ex partner: Not on file    Emotionally abused: Not on file    Physically abused: Not on file    Forced sexual activity: Not on file  Other Topics Concern  . Not on file  Social History Narrative   Eats fast food daily   No exercise outside of work   L[place] in [place] with his dog   MAINTENANCE TECHNICIAN     Physical Exam  Vital Signs and Nursing Notes reviewed Vitals:   11/30/17 2130 11/30/17 2200  BP: 124/85 (!) 148/87  Pulse: 61 (!) 59  Resp: 13 15  Temp:    SpO2: 100% 100%    CONSTITUTIONAL: Ill-appearing, NAD NEURO:  Alert and oriented x 3, mild right arm and right leg weakness EYES:  eyes equal and reactive ENT/NECK:  no LAD, no JVD CARDIO: Regular rate, well-perfused, normal S1 and S2 PULM:  CTAB no wheezing or rhonchi GI/GU:  normal bowel sounds, non-distended, non-tender MSK/SPINE:  No gross deformities, no edema SKIN:  no rash, atraumatic PSYCH:  Appropriate speech and behavior  Diagnostic and Interventional Summary    EKG Interpretation  Date/Time:    Ventricular Rate:    PR Interval:    QRS Duration:   QT Interval:    QTC Calculation:   R Axis:     Text Interpretation:        Labs Reviewed  BASIC METABOLIC PANEL - Abnormal; Notable for the following components:      Result Value   Glucose, Bld 127 (*)    BUN 22  (*)    Creatinine, Ser 1.36 (*)    All other components within normal limits  CBC - Abnormal; Notable for the following  components:   WBC 10.7 (*)    All other components within normal limits  POCT I-STAT, CHEM 8 - Abnormal; Notable for the following components:   BUN 24 (*)    Creatinine, Ser 1.30 (*)    Glucose, Bld 117 (*)    All other components within normal limits  ETHANOL  PROTIME-INR  APTT  DIFFERENTIAL  RAPID URINE DRUG SCREEN, HOSP PERFORMED  URINALYSIS, ROUTINE W REFLEX MICROSCOPIC  I-STAT TROPONIN, ED  POCT I-STAT TROPONIN I  I-STAT CHEM 8, ED  I-STAT TROPONIN, ED  POCT I-STAT TROPONIN I    CT Angio Head W or Wo Contrast  Final Result    CT Angio Neck W and/or Wo Contrast  Final Result    CT Angio Chest/Abd/Pel for Dissection W and/or Wo Contrast  Final Result    DG Chest 2 View  Final Result      Medications  iopamidol (ISOVUE-370) 76 % injection 150 mL ( Intravenous Canceled Entry 11/30/17 2049)  nitroGLYCERIN (NITROSTAT) SL tablet 0.4 mg (0.4 mg Sublingual Given 11/30/17 1932)  HYDROmorphone (DILAUDID) injection 1 mg (1 mg Intravenous Given 11/30/17 2032)  iopamidol (ISOVUE-370) 76 % injection 150 mL (150 mLs Intravenous Contrast Given 11/30/17 2049)     Procedures Critical Care Critical Care Documentation Critical care time provided by me (excluding procedures): 35 minutes  Condition necessitating critical care: Concern for acute stroke  Components of critical care management: reviewing of prior records, laboratory and imaging interpretation, frequent re-examination and reassessment of vital signs, discussion with consulting services    ED Course and Medical Decision Making  I have reviewed the triage vital signs and the nursing notes.  Pertinent labs & imaging results that were available during my care of the patient were reviewed by me and considered in my medical decision making (see below for details).  Concern for cardiac chest pain in this  47 year old male only 2 weeks removed from multiple stent placement.  EKG on concerning, first troponin negative.  On exam there is subtle but objective weakness to the right arm and right leg, patient thinks that this is been present for over a month.  Also endorsing right-sided neck pain for several weeks.  Concern for completed stroke and/or carotid dissection, also considering aortic dissection given chest pain and neurological deficit, will CT.  Clinical Course as of Nov 30 2256  Fri Nov 30, 2017  2041 Further discussion with patient regarding timing of his weakness.  When pressed, he admits that this weakness could have possibly started within the past 2 hours when his chest pain began.  Further raising concern for dissection versus acute stroke.  Code stroke alert initiated.  Will obtain immediate imaging.   [MB]  2238 Per tele-neurology, NIH stroke scale is 0, not a TPA candidate, imaging pending.   [MB]    Clinical Course User Index [MB] Sabas Sous, MD    Poor posterior circulation per neuroradiology.  Discussed with post telemetry neurology and Dr. Amada Jupiter of [facility] neurology, not interventional candidate based on imaging.  Admitted to hospitalist service for further care, to be transferred to [facility].  Elmer Sow. Pilar Plate, MD [facility] Emergency Medicine [facility] [email or web address]  Final Clinical Impressions(s) / ED Diagnoses     ICD-10-CM   1. Chest pain, unspecified type R07.9   2. 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  

## 2017-12-01 ENCOUNTER — Inpatient Hospital Stay (HOSPITAL_COMMUNITY): Payer: Self-pay

## 2017-12-01 DIAGNOSIS — G459 Transient cerebral ischemic attack, unspecified: Secondary | ICD-10-CM

## 2017-12-01 DIAGNOSIS — I639 Cerebral infarction, unspecified: Secondary | ICD-10-CM | POA: Diagnosis present

## 2017-12-01 DIAGNOSIS — R079 Chest pain, unspecified: Secondary | ICD-10-CM

## 2017-12-01 HISTORY — PX: IR ANGIO INTRA EXTRACRAN SEL COM CAROTID INNOMINATE BILAT MOD SED: IMG5360

## 2017-12-01 HISTORY — PX: IR ANGIO VERTEBRAL SEL VERTEBRAL BILAT MOD SED: IMG5369

## 2017-12-01 LAB — URINALYSIS, ROUTINE W REFLEX MICROSCOPIC
BILIRUBIN URINE: NEGATIVE
Glucose, UA: NEGATIVE mg/dL
Ketones, ur: NEGATIVE mg/dL
LEUKOCYTES UA: NEGATIVE
NITRITE: NEGATIVE
PROTEIN: 30 mg/dL — AB
pH: 5 (ref 5.0–8.0)

## 2017-12-01 LAB — HEMOGLOBIN A1C
HEMOGLOBIN A1C: 8.5 % — AB (ref 4.8–5.6)
Mean Plasma Glucose: 197.25 mg/dL

## 2017-12-01 LAB — LIPID PANEL
CHOLESTEROL: 287 mg/dL — AB (ref 0–200)
HDL: 22 mg/dL — ABNORMAL LOW (ref >40)
LDL Cholesterol: 205 mg/dL — ABNORMAL HIGH (ref 0–99)
Total CHOL/HDL Ratio: 13 RATIO
Triglycerides: 300 mg/dL — ABNORMAL HIGH (ref <150)
VLDL: 60 mg/dL — ABNORMAL HIGH (ref 0–40)

## 2017-12-01 LAB — RAPID URINE DRUG SCREEN, HOSP PERFORMED
AMPHETAMINES: NOT DETECTED
Barbiturates: NOT DETECTED
Benzodiazepines: NOT DETECTED
COCAINE: NOT DETECTED
OPIATES: NOT DETECTED
TETRAHYDROCANNABINOL: NOT DETECTED

## 2017-12-01 LAB — GLUCOSE, CAPILLARY
GLUCOSE-CAPILLARY: 117 mg/dL — AB (ref 70–99)
Glucose-Capillary: 141 mg/dL — ABNORMAL HIGH (ref 70–99)
Glucose-Capillary: 154 mg/dL — ABNORMAL HIGH (ref 70–99)

## 2017-12-01 LAB — CBC
HEMATOCRIT: 39 % (ref 39.0–52.0)
Hemoglobin: 12.5 g/dL — ABNORMAL LOW (ref 13.0–17.0)
MCH: 29.6 pg (ref 26.0–34.0)
MCHC: 32.1 g/dL (ref 30.0–36.0)
MCV: 92.4 fL (ref 78.0–100.0)
Platelets: 284 10*3/uL (ref 150–400)
RBC: 4.22 MIL/uL (ref 4.22–5.81)
RDW: 15.6 % — ABNORMAL HIGH (ref 11.5–15.5)
WBC: 10.3 10*3/uL (ref 4.0–10.5)

## 2017-12-01 LAB — CREATININE, SERUM
Creatinine, Ser: 1.18 mg/dL (ref 0.61–1.24)
GFR calc Af Amer: >60 mL/min (ref >60)
GFR calc non Af Amer: >60 mL/min (ref >60)

## 2017-12-01 LAB — TROPONIN I: Troponin I: <0.03 ng/mL (ref <0.03)

## 2017-12-01 MED ORDER — INSULIN ASPART 100 UNIT/ML ~~LOC~~ SOLN
0.0000 [IU] | Freq: Three times a day (TID) | SUBCUTANEOUS | Status: DC
  Administered 2017-12-01: 1 [IU] via SUBCUTANEOUS
  Administered 2017-12-02: 2 [IU] via SUBCUTANEOUS
  Administered 2017-12-02: 1 [IU] via SUBCUTANEOUS
  Administered 2017-12-03: 2 [IU] via SUBCUTANEOUS
  Administered 2017-12-04 (×3): 1 [IU] via SUBCUTANEOUS

## 2017-12-01 MED ORDER — LIDOCAINE HCL 1 % IJ SOLN
INTRAMUSCULAR | Status: AC
  Filled 2017-12-01: qty 20

## 2017-12-01 MED ORDER — FENTANYL CITRATE (PF) 100 MCG/2ML IJ SOLN
INTRAMUSCULAR | Status: AC
  Filled 2017-12-01: qty 2

## 2017-12-01 MED ORDER — PANTOPRAZOLE SODIUM 20 MG PO TBEC
20.0000 mg | DELAYED_RELEASE_TABLET | Freq: Every day | ORAL | Status: DC
  Administered 2017-12-01 – 2017-12-05 (×3): 20 mg via ORAL
  Filled 2017-12-01 (×4): qty 1

## 2017-12-01 MED ORDER — STROKE: EARLY STAGES OF RECOVERY BOOK: Freq: Once | Status: DC

## 2017-12-01 MED ORDER — IOHEXOL 300 MG/ML  SOLN
150.0000 mL | Freq: Once | INTRAMUSCULAR | Status: AC | PRN
  Administered 2017-12-01: 75 mL via INTRA_ARTERIAL

## 2017-12-01 MED ORDER — ACETAMINOPHEN 325 MG PO TABS
650.0000 mg | ORAL_TABLET | ORAL | Status: DC | PRN
  Administered 2017-12-03 – 2017-12-05 (×2): 650 mg via ORAL
  Filled 2017-12-01 (×2): qty 2

## 2017-12-01 MED ORDER — TICAGRELOR 90 MG PO TABS
90.0000 mg | ORAL_TABLET | Freq: Two times a day (BID) | ORAL | Status: DC
  Administered 2017-12-01 – 2017-12-02 (×4): 90 mg via ORAL
  Filled 2017-12-01 (×3): qty 1

## 2017-12-01 MED ORDER — LEVOTHYROXINE SODIUM 25 MCG PO TABS
125.0000 ug | ORAL_TABLET | Freq: Every day | ORAL | Status: DC
  Administered 2017-12-02 – 2017-12-05 (×2): 125 ug via ORAL
  Filled 2017-12-01 (×4): qty 1

## 2017-12-01 MED ORDER — LIDOCAINE HCL 1 % IJ SOLN
INTRAMUSCULAR | Status: AC | PRN
  Administered 2017-12-01: 10 mL

## 2017-12-01 MED ORDER — SODIUM CHLORIDE 0.9 % IV SOLN
INTRAVENOUS | Status: DC
  Administered 2017-12-01 – 2017-12-02 (×4): via INTRAVENOUS

## 2017-12-01 MED ORDER — IOPAMIDOL (ISOVUE-300) INJECTION 61%
INTRAVENOUS | Status: AC
  Administered 2017-12-01: 30 mL via INTRA_ARTERIAL
  Filled 2017-12-01: qty 50

## 2017-12-01 MED ORDER — SODIUM CHLORIDE 0.9 % IV BOLUS: 250.0000 mL | Freq: Once | INTRAVENOUS | Status: DC

## 2017-12-01 MED ORDER — HEPARIN SODIUM (PORCINE) 1000 UNIT/ML IJ SOLN
INTRAMUSCULAR | Status: AC | PRN
  Administered 2017-12-01: 500 [IU] via INTRAVENOUS

## 2017-12-01 MED ORDER — SODIUM CHLORIDE 0.9 % IV SOLN
INTRAVENOUS | Status: DC
  Administered 2017-12-01: 03:00:00 via INTRAVENOUS

## 2017-12-01 MED ORDER — ACETAMINOPHEN 160 MG/5ML PO SOLN: 650.0000 mg | ORAL | Status: DC | PRN

## 2017-12-01 MED ORDER — EZETIMIBE 10 MG PO TABS
10.0000 mg | ORAL_TABLET | Freq: Every day | ORAL | Status: DC
  Administered 2017-12-01 – 2017-12-05 (×3): 10 mg via ORAL
  Filled 2017-12-01 (×3): qty 1

## 2017-12-01 MED ORDER — ASPIRIN 81 MG PO CHEW
81.0000 mg | CHEWABLE_TABLET | Freq: Every day | ORAL | Status: DC
  Administered 2017-12-01 – 2017-12-02 (×2): 81 mg via ORAL
  Filled 2017-12-01 (×2): qty 1

## 2017-12-01 MED ORDER — FLUDROCORTISONE ACETATE 0.1 MG PO TABS
0.1000 mg | ORAL_TABLET | Freq: Every day | ORAL | Status: DC
  Administered 2017-12-01 – 2017-12-02 (×2): 0.1 mg via ORAL
  Filled 2017-12-01 (×4): qty 1

## 2017-12-01 MED ORDER — FENTANYL CITRATE (PF) 100 MCG/2ML IJ SOLN
INTRAMUSCULAR | Status: AC | PRN
  Administered 2017-12-01: 25 ug via INTRAVENOUS

## 2017-12-01 MED ORDER — MIDAZOLAM HCL 2 MG/2ML IJ SOLN
INTRAMUSCULAR | Status: AC | PRN
  Administered 2017-12-01: 1 mg via INTRAVENOUS

## 2017-12-01 MED ORDER — ACETAMINOPHEN 650 MG RE SUPP: 650.0000 mg | RECTAL | Status: DC | PRN

## 2017-12-01 MED ORDER — MIDAZOLAM HCL 2 MG/2ML IJ SOLN
INTRAMUSCULAR | Status: AC
  Filled 2017-12-01: qty 4

## 2017-12-01 MED ORDER — ENOXAPARIN SODIUM 40 MG/0.4ML ~~LOC~~ SOLN
40.0000 mg | SUBCUTANEOUS | Status: DC
  Administered 2017-12-01 – 2017-12-03 (×3): 40 mg via SUBCUTANEOUS
  Filled 2017-12-01 (×3): qty 0.4

## 2017-12-01 MED ORDER — HEPARIN SODIUM (PORCINE) 1000 UNIT/ML IJ SOLN
INTRAMUSCULAR | Status: AC
  Filled 2017-12-01: qty 1

## 2017-12-01 MED ORDER — SENNOSIDES-DOCUSATE SODIUM 8.6-50 MG PO TABS: 1.0000 | ORAL_TABLET | Freq: Every evening | ORAL | Status: DC | PRN

## 2017-12-01 NOTE — H&P (Addendum)
TRH H&P    Patient Demographics:    Devin [name], is a 47 y.o. male  MRN: [id number]  DOB - [birth year]  Admit Date - 11/30/2017  Referring MD/NP/PA: Dr. Sedonia Small  Outpatient Primary MD for the patient is Patient, No Pcp Per  Patient coming from: Home  Chief complaint-chest pain   HPI:    Devin [name]  is a 47 y.o. male, with history of CAD status post stent placement x3, two weeks ago, came to hospital with sudden onset of left-sided chest pain radiating down the left arm.  He had dizziness and diaphoresis.  During the examination by the ED physician patient was found to have mild weakness of left side including left arm and leg.  Also had numbness.  Code stroke was called.  He was evaluated by tele neurology.  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  Neurology was consulted at [facility].  And recommended to transfer to [facility] for further evaluation.  Patient denies chest pain at this time. Denies shortness of breath. Denies nausea vomiting or diarrhea. Denies fever or dysuria.    Review of systems:    In addition to the HPI above,   All other systems reviewed and are negative.   With Past History of the following :    Past Medical History:  Diagnosis Date  . Asthma   . Chest pain   . Coronary artery disease   . Dyspnea   . GERD (gastroesophageal reflux disease)   . History of kidney stones   . History of rhabdomyolysis   . Hyperlipidemia   . Hypertension   . Hypothyroidism   . Kidney calculus 2014  . Pericardial effusion    Small, by dobutamine echocardiogram, 04/2006  . Tobacco abuse   . Type 2 diabetes mellitus without complications ([place]) 4/62/8638  . Vitamin D deficiency       Past Surgical History:  Procedure Laterality Date  . CARDIAC CATHETERIZATION  09/2012   "Nonobstructive CAD with 30%  proximal, 40% mid LAD disease; 30% proximal CFX; EF 55-65%"  . CHOLECYSTECTOMY N/A 01/27/2013   Procedure: LAPAROSCOPIC CHOLECYSTECTOMY;  Surgeon: Jamesetta So, MD;  Location: [facility];  Service: General;  Laterality: N/A;  . CORONARY STENT INTERVENTION N/A 11/15/2017   Procedure: CORONARY STENT INTERVENTION;  Surgeon: Leonie Man, MD;  Location: [facility];  Service: Cardiovascular;  Laterality: N/A;  . HERNIA REPAIR     As a child  . INSERTION OF MESH N/A 11/13/2012   Procedure: INSERTION OF MESH;  Surgeon: Jamesetta So, MD;  Location: [facility];  Service: General;  Laterality: N/A;  . KIDNEY STONE SURGERY    . LEFT HEART CATH AND CORONARY ANGIOGRAPHY N/A 11/15/2017   Procedure: LEFT HEART CATH AND CORONARY ANGIOGRAPHY;  Surgeon: Leonie Man, MD;  Location: [place] [facility];  Service: Cardiovascular;  Laterality: N/A;  . PERCUTANEOUS NEPHROLITHOTRIPSY    . UMBILICAL HERNIA REPAIR N/A 11/13/2012  Procedure: UMBILICAL HERNIORRHAPHY;  Surgeon: Jamesetta So, MD;  Location: [facility];  Service: General;  Laterality: N/A;  . VARICOCELECTOMY        Social History:      Social History   Tobacco Use  . Smoking status: Current Some Day Smoker    Packs/day: 0.50    Years: 26.00    Pack years: 13.00    Types: Cigarettes    Start date: 03/13/1985  . Smokeless tobacco: Former Systems developer    Types: Snuff, Chew    Quit date: 03/14/1987  . Tobacco comment: started smoking at age 79. chewed and dipped for 2 years  Substance Use Topics  . Alcohol use: No       Family History :     Family History  Problem Relation Age of Onset  . Coronary artery disease Father   . Hypertension Father   . Hyperlipidemia Father   . Diabetes Father   . Congestive Heart Failure Father 59  . Arrhythmia Father        had an ICD  . Diabetes Mother   . Hypertension Mother   . Hyperlipidemia Mother   . Obesity Mother 27       died after bariatric surgery, liver failure and infection  . Coronary artery  disease Maternal Grandfather        both grandfathers and several uncles  . Coronary artery disease Unknown   . Diabetes Sister        both sisters      Home Medications:   Prior to Admission medications   Medication Sig Start Date End Date Taking? Authorizing Provider  aspirin EC 325 MG tablet Take 650 mg by mouth See admin instructions. Took two doses today prior to admission   Yes [provider]  aspirin EC 81 MG EC tablet Take 1 tablet (81 mg total) by mouth daily. 11/17/17  Yes Patrecia Pour, MD  glimepiride (AMARYL) 2 MG tablet Take 1 tablet (2 mg total) by mouth daily with breakfast. 11/16/17  Yes Patrecia Pour, MD  levothyroxine (SYNTHROID, LEVOTHROID) 125 MCG tablet Take 1 tablet (125 mcg total) by mouth daily before breakfast. 11/16/17  Yes Patrecia Pour, MD  pantoprazole (PROTONIX) 20 MG tablet Take 1 tablet (20 mg total) by mouth daily. 11/16/17  Yes Patrecia Pour, MD  sacubitril-valsartan (ENTRESTO) 24-26 MG Take 1 tablet by mouth 2 (two) times daily. 11/26/17  Yes BranchAlphonse Guild, MD  ticagrelor (BRILINTA) 90 MG TABS tablet Take 1 tablet (90 mg total) by mouth 2 (two) times daily. 11/16/17  Yes Patrecia Pour, MD  Blood Glucose Monitoring Suppl w/Device KIT Dispense based on patient and insurance preference. Check blood sugar daily before breakfast (ICD9 250.0) 04/29/17   Pollina, Gwenyth Allegra, MD  nitroGLYCERIN (NITROSTAT) 0.4 MG SL tablet Place 0.4 mg under the tongue every 5 (five) minutes as needed for chest pain.    [provider]     Allergies:     Allergies  Allergen Reactions  . Bee Venom Swelling  . Crestor [Rosuvastatin] Other (See Comments)    rhabdomyolosis     Physical Exam:   Vitals  Blood pressure (!) 148/87, pulse (!) 59, temperature 98.2 F (36.8 C), temperature source Oral, resp. rate 15, SpO2 100 %.  1.  General: Appears in no acute distress  2. Psychiatric:  Intact judgement and  insight, awake alert, oriented x 3.  3.  Neurologic: Motor 4/5 in both left upper and  lower extremity, sensation is intact, cranial nerves II through XII intact bilaterally.  4. Eyes :  anicteric sclerae, moist conjunctivae with no lid lag. PERRLA.  5. ENMT:  Oropharynx clear with moist mucous membranes and good dentition  6. Neck:  supple, no cervical lymphadenopathy appriciated, No thyromegaly  7. Respiratory : Normal respiratory effort, good air movement bilaterally,clear to  auscultation bilaterally  8. Cardiovascular : RRR, no gallops, rubs or murmurs, no leg edema  9. Gastrointestinal:  Positive bowel sounds, abdomen soft, non-tender to palpation,no hepatosplenomegaly, no rigidity or guarding       10. Skin:  No cyanosis, normal texture and turgor, no rash, lesions or ulcers  11.Musculoskeletal:  Good muscle tone,  joints appear normal , no effusions,  normal range of motion    Data Review:    CBC Recent Labs  Lab 11/30/17 1928 11/30/17 2043  WBC 10.7*  --   HGB 13.5 16.3  HCT 40.8 48.0  PLT 296  --   MCV 90.9  --   MCH 30.1  --   MCHC 33.1  --   RDW 15.5  --   LYMPHSABS 3.8  --   MONOABS 0.6  --   EOSABS 0.1  --   BASOSABS 0.0  --    ------------------------------------------------------------------------------------------------------------------  Results for orders placed or performed during the hospital encounter of 11/30/17 (from the past 48 hour(s))  Basic metabolic panel     Status: Abnormal   Collection Time: 11/30/17  7:28 PM  Result Value Ref Range   Sodium 140 135 - 145 mmol/L   Potassium 3.9 3.5 - 5.1 mmol/L   Chloride 106 98 - 111 mmol/L   CO2 23 22 - 32 mmol/L   Glucose, Bld 127 (H) 70 - 99 mg/dL   BUN 22 (H) 6 - 20 mg/dL   Creatinine, Ser 1.36 (H) 0.61 - 1.24 mg/dL   Calcium 9.9 8.9 - 10.3 mg/dL   GFR calc non Af Amer >60 >60 mL/min   GFR calc Af Amer >60 >60 mL/min    Comment: (NOTE) The eGFR has been calculated using the CKD EPI equation. This calculation has not been  validated in all clinical situations. eGFR's persistently <60 mL/min signify possible Chronic Kidney Disease.    Anion gap 11 5 - 15    Comment: Performed at [facility], [address] [place], [place] [address]  CBC     Status: Abnormal   Collection Time: 11/30/17  7:28 PM  Result Value Ref Range   WBC 10.7 (H) 4.0 - 10.5 K/uL   RBC 4.49 4.22 - 5.81 MIL/uL   Hemoglobin 13.5 13.0 - 17.0 g/dL   HCT 40.8 39.0 - 52.0 %   MCV 90.9 78.0 - 100.0 fL   MCH 30.1 26.0 - 34.0 pg   MCHC 33.1 30.0 - 36.0 g/dL   RDW 15.5 11.5 - 15.5 %   Platelets 296 150 - 400 K/uL    Comment: Performed at [facility], [address], [place], [place] [address]  Ethanol     Status: None   Collection Time: 11/30/17  7:28 PM  Result Value Ref Range   Alcohol, Ethyl (B) <10 <10 mg/dL    Comment: (NOTE) Lowest detectable limit for serum alcohol is 10 mg/dL. For medical purposes only. Performed at  [facility], [address] [place], [place] [address]   Protime-INR     Status: None   Collection Time: 11/30/17  7:28 PM  Result Value Ref Range  Prothrombin Time 13.3 11.4 - 15.2 seconds   INR 1.02     Comment: Performed at [facility], [address], [place], [place] [address]  APTT     Status: None   Collection Time: 11/30/17  7:28 PM  Result Value Ref Range   aPTT 35 24 - 36 seconds    Comment: Performed at [facility], [address] [place], [place] [address]  Differential     Status: None   Collection Time: 11/30/17  7:28 PM  Result Value Ref Range   Neutrophils Relative % 57 %   Neutro Abs 6.0 1.7 - 7.7 K/uL   Lymphocytes Relative 36 %   Lymphs Abs 3.8 0.7 - 4.0 K/uL   Monocytes Relative 6 %   Monocytes Absolute 0.6 0.1 - 1.0 K/uL   Eosinophils Relative 1 %   Eosinophils Absolute 0.1 0.0 - 0.7 K/uL   Basophils Relative 0 %   Basophils Absolute 0.0 0.0 - 0.1 K/uL    Comment: Performed at [facility], [address], [place], [place] [address]  POCT i-Stat troponin I     Status: None    Collection Time: 11/30/17  7:40 PM  Result Value Ref Range   Troponin i, poc 0.00 0.00 - 0.08 ng/mL   Comment 3            Comment: Due to the release kinetics of cTnI, a negative result within the first hours of the onset of symptoms does not rule out myocardial infarction with certainty. If myocardial infarction is still suspected, repeat the test at appropriate intervals.   I-STAT, chem 8     Status: Abnormal   Collection Time: 11/30/17  8:43 PM  Result Value Ref Range   Sodium 140 135 - 145 mmol/L   Potassium 3.8 3.5 - 5.1 mmol/L   Chloride 106 98 - 111 mmol/L   BUN 24 (H) 6 - 20 mg/dL   Creatinine, Ser 1.30 (H) 0.61 - 1.24 mg/dL   Glucose, Bld 117 (H) 70 - 99 mg/dL   Calcium, Ion 1.20 1.15 - 1.40 mmol/L   TCO2 23 22 - 32 mmol/L   Hemoglobin 16.3 13.0 - 17.0 g/dL   HCT 48.0 39.0 - 52.0 %  POCT i-Stat troponin I     Status: None   Collection Time: 11/30/17 10:08 PM  Result Value Ref Range   Troponin i, poc 0.01 0.00 - 0.08 ng/mL   Comment 3            Comment: Due to the release kinetics of cTnI, a negative result within the first hours of the onset of symptoms does not rule out myocardial infarction with certainty. If myocardial infarction is still suspected, repeat the test at appropriate intervals.     Chemistries  Recent Labs  Lab 11/30/17 1928 11/30/17 2043  NA 140 140  K 3.9 3.8  CL 106 106  CO2 23  --   GLUCOSE 127* 117*  BUN 22* 24*  CREATININE 1.36* 1.30*  CALCIUM 9.9  --    ------------------------------------------------------------------------------------------------------------------  ------------------------------------------------------------------------------------------------------------------ GFR: Estimated Creatinine Clearance: 109.8 mL/min (A) (by C-G formula based on SCr of 1.3 mg/dL (H)). Liver Function Tests: No results for input(s): AST, ALT, ALKPHOS, BILITOT, PROT, ALBUMIN in the last 168 hours. No results for input(s): LIPASE,  AMYLASE in the last 168 hours. No results for input(s): AMMONIA in the last 168 hours. Coagulation Profile: Recent Labs  Lab 11/30/17 1928  INR 1.02   Cardiac Enzymes: No results  for input(s): CKTOTAL, CKMB, CKMBINDEX, TROPONINI in the last 168 hours. BNP (last 3 results) No results for input(s): PROBNP in the last 8760 hours. HbA1C: No results for input(s): HGBA1C in the last 72 hours. CBG: No results for input(s): GLUCAP in the last 168 hours. Lipid Profile: No results for input(s): CHOL, HDL, LDLCALC, TRIG, CHOLHDL, LDLDIRECT in the last 72 hours. Thyroid Function Tests: No results for input(s): TSH, T4TOTAL, FREET4, T3FREE, THYROIDAB in the last 72 hours. Anemia Panel: No results for input(s): VITAMINB12, FOLATE, FERRITIN, TIBC, IRON, RETICCTPCT in the last 72 hours.  --------------------------------------------------------------------------------------------------------------- Urine analysis:    Component Value Date/Time   COLORURINE AMBER (A) 11/13/2017 2006   APPEARANCEUR TURBID (A) 11/13/2017 2006   LABSPEC 1.026 11/13/2017 2006   PHURINE 6.0 11/13/2017 2006   GLUCOSEU 50 (A) 11/13/2017 2006   HGBUR NEGATIVE 11/13/2017 2006   [place] NEGATIVE 11/13/2017 2006   KETONESUR NEGATIVE 11/13/2017 2006   PROTEINUR 30 (A) 11/13/2017 2006   UROBILINOGEN 0.2 10/06/2013 0305   NITRITE NEGATIVE 11/13/2017 2006   LEUKOCYTESUR NEGATIVE 11/13/2017 2006      Imaging Results:    Ct Angio Head W Or Wo Contrast  Result Date: 11/30/2017 CLINICAL DATA:  Chest pain, LEFT arm paresthesias. History of diabetes, hypertension, hyperlipidemia. EXAM: CT ANGIOGRAPHY HEAD AND NECK TECHNIQUE: Multidetector CT imaging of the head and neck was performed using the standard protocol during bolus administration of intravenous contrast. Multiplanar CT image reconstructions and MIPs were obtained to evaluate the vascular anatomy. Carotid stenosis measurements (when applicable) are obtained  utilizing NASCET criteria, using the distal internal carotid diameter as the denominator. CONTRAST:  75 cc ISOVUE-370 IOPAMIDOL (ISOVUE-370) INJECTION 76% COMPARISON:  None. FINDINGS: CT HEAD FINDINGS BRAIN: No intraparenchymal hemorrhage, mass effect nor midline shift. The ventricles and sulci are normal. No acute large vascular territory infarcts. No abnormal extra-axial fluid collections. 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 Possible small RIGHT cerebellar infarct. Basal cisterns are patent. VASCULAR: Unremarkable. SKULL/SOFT TISSUES: No skull fracture. Small RIGHT parietal convexity hematoma versus scarring. ORBITS/SINUSES: The included ocular globes and orbital contents are normal.Mild lobulated ethmoid mucosal thickening. Mastoid air cells are well aerated. OTHER: None. CTA NECK FINDINGS: AORTIC ARCH: Normal appearance of the thoracic arch, normal branch pattern. The origins of the innominate, left Common carotid artery and subclavian artery are patent, mild intimal thickening calcific atherosclerosis. RIGHT CAROTID SYSTEM: Common carotid artery is patent, mild intimal thickening. Mild calcific atherosclerosis of the carotid bifurcation without hemodynamically significant stenosis by NASCET criteria. Normal appearance of the internal carotid artery. LEFT CAROTID SYSTEM: Common carotid artery is patent. Intimal thickening calcific atherosclerosis resultant 50% stenosis LEFT internal carotid artery origin by NASCET criteria. Patent LEFT internal carotid artery. VERTEBRAL ARTERIES:Codominant patent vertebral arteries without luminal irregularity. And SKELETON: No acute osseous process though bone windows have not been submitted. Multiple absent teeth. No advanced cervical spondylosis. OTHER NECK: Soft tissues of the neck are nonacute though, not tailored for evaluation. UPPER CHEST: Please see CT of chest from same day, reported separately. CTA HEAD FINDINGS (venous contamination): ANTERIOR CIRCULATION: Patent  cervical internal carotid arteries, petrous, cavernous and supra clinoid internal carotid arteries. Patent anterior communicating artery. Patent anterior and middle cerebral arteries. No large vessel occlusion, significant stenosis, contrast extravasation or aneurysm. POSTERIOR CIRCULATION: Severe stenosis LEFT proximal V4 segment occluded distally. Distal RIGHT V4 occlusion. Occluded proximal basilar artery with immediate though thready reconstitution. Patent PICA. Poorly demonstrated remaining main branch vessels. Tenuous, thready posterior cerebral arteries, diminutive RIGHT P com. No contrast  extravasation or aneurysm. VENOUS SINUSES: Major dural venous sinuses are patent though not tailored for evaluation on this angiographic examination. ANATOMIC VARIANTS: None. DELAYED PHASE: No abnormal intracranial enhancement. MIP images reviewed. IMPRESSION: CT HEAD: 1. 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 Suspected small RIGHT cerebellar infarct. 2. Otherwise negative CT HEAD with and without contrast. CTA NECK: 1. No acute vascular process. 2. 50% stenosis LEFT ICA.  Patent vertebral arteries. CTA HEAD: 1. Limited by venous phase. 2. 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 Attenuated bilateral PCA (potentially obscured by venous structures). Recommend neuro interventional consultation. Critical Value/emergent results were called by telephone at the time of interpretation on 11/30/2017 at 10:15 pm to Dr. Gerlene Fee , who verbally acknowledged these results. Electronically Signed   By: Elon Alas M.D.   On: 11/30/2017 22:20   Dg Chest 2 View  Result Date: 11/30/2017 CLINICAL DATA:  Pt with left side, left arm pain, SOB starting today. Pt passed out during exam. Hx of CAD, DM. Smoker. EXAM: CHEST - 2 VIEW COMPARISON:  11/13/2017 FINDINGS: Cardiac silhouette is normal in size and configuration. Normal mediastinal and hilar  contours. Clear lungs.  No pleural effusion or pneumothorax. Skeletal structures are intact. IMPRESSION: No active cardiopulmonary disease. Electronically Signed   By: Lajean Manes M.D.   On: 11/30/2017 19:34   Ct Angio Neck W And/or Wo Contrast  Result Date: 11/30/2017 CLINICAL DATA:  Chest pain, LEFT arm paresthesias. History of diabetes, hypertension, hyperlipidemia. EXAM: CT ANGIOGRAPHY HEAD AND NECK TECHNIQUE: Multidetector CT imaging of the head and neck was performed using the standard protocol during bolus administration of intravenous contrast. Multiplanar CT image reconstructions and MIPs were obtained to evaluate the vascular anatomy. Carotid stenosis measurements (when applicable) are obtained utilizing NASCET criteria, using the distal internal carotid diameter as the denominator. CONTRAST:  75 cc ISOVUE-370 IOPAMIDOL (ISOVUE-370) INJECTION 76% COMPARISON:  None. FINDINGS: CT HEAD FINDINGS BRAIN: No intraparenchymal hemorrhage, mass effect nor midline shift. The ventricles and sulci are normal. No acute large vascular territory infarcts. No abnormal extra-axial fluid collections. 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 Possible small RIGHT cerebellar infarct. Basal cisterns are patent. VASCULAR: Unremarkable. SKULL/SOFT TISSUES: No skull fracture. Small RIGHT parietal convexity hematoma versus scarring. ORBITS/SINUSES: The included ocular globes and orbital contents are normal.Mild lobulated ethmoid mucosal thickening. Mastoid air cells are well aerated. OTHER: None. CTA NECK FINDINGS: AORTIC ARCH: Normal appearance of the thoracic arch, normal branch pattern. The origins of the innominate, left Common carotid artery and subclavian artery are patent, mild intimal thickening calcific atherosclerosis. RIGHT CAROTID SYSTEM: Common carotid artery is patent, mild intimal thickening. Mild calcific atherosclerosis of the carotid bifurcation without hemodynamically significant stenosis by NASCET criteria.  Normal appearance of the internal carotid artery. LEFT CAROTID SYSTEM: Common carotid artery is patent. Intimal thickening calcific atherosclerosis resultant 50% stenosis LEFT internal carotid artery origin by NASCET criteria. Patent LEFT internal carotid artery. VERTEBRAL ARTERIES:Codominant patent vertebral arteries without luminal irregularity. And SKELETON: No acute osseous process though bone windows have not been submitted. Multiple absent teeth. No advanced cervical spondylosis. OTHER NECK: Soft tissues of the neck are nonacute though, not tailored for evaluation. UPPER CHEST: Please see CT of chest from same day, reported separately. CTA HEAD FINDINGS (venous contamination): ANTERIOR CIRCULATION: Patent cervical internal carotid arteries, petrous, cavernous and supra clinoid internal carotid arteries. Patent anterior communicating artery. Patent anterior and middle cerebral arteries. No large vessel occlusion, significant stenosis, contrast extravasation or aneurysm. POSTERIOR CIRCULATION: Severe  stenosis LEFT proximal V4 segment occluded distally. Distal RIGHT V4 occlusion. Occluded proximal basilar artery with immediate though thready reconstitution. Patent PICA. Poorly demonstrated remaining main branch vessels. Tenuous, thready posterior cerebral arteries, diminutive RIGHT P com. No contrast extravasation or aneurysm. VENOUS SINUSES: Major dural venous sinuses are patent though not tailored for evaluation on this angiographic examination. ANATOMIC VARIANTS: None. DELAYED PHASE: No abnormal intracranial enhancement. MIP images reviewed. IMPRESSION: CT HEAD: 1. 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 Suspected small RIGHT cerebellar infarct. 2. Otherwise negative CT HEAD with and without contrast. CTA NECK: 1. No acute vascular process. 2. 50% stenosis LEFT ICA.  Patent vertebral arteries. CTA HEAD: 1. Limited by venous phase. 2. Segmental occlusion basilar artery and bilateral vertebral arteries (suspected  thromboembolism, less likely dissection). Attenuated bilateral PCA (potentially obscured by venous structures). Recommend neuro interventional consultation. Critical Value/emergent results were called by telephone at the time of interpretation on 11/30/2017 at 10:15 pm to Dr. Gerlene Fee , who verbally acknowledged these results. Electronically Signed   By: Elon Alas M.D.   On: 11/30/2017 22:20   Ct Angio Chest/abd/pel For Dissection W And/or Wo Contrast  Result Date: 11/30/2017 CLINICAL DATA:  Patient complains of crushing chest pain x2 hours with left arm paresthesias and pain. Patient reports taking 2 ASA prior to arrival unable to state how much. He continues to endorse chest pain and SOB. Objectively, patient is diaphoretic and is ill appearing. Cardiac and pulmonary exam unremarkable EXAM: CT ANGIOGRAPHY CHEST, ABDOMEN AND PELVIS TECHNIQUE: Multidetector CT imaging through the chest, abdomen and pelvis was performed using the standard protocol during bolus administration of intravenous contrast. Multiplanar reconstructed images and MIPs were obtained and reviewed to evaluate the vascular anatomy. CONTRAST:  176m ISOVUE-370 IOPAMIDOL (ISOVUE-370) INJECTION 76%, <See Chart> ISOVUE-370 IOPAMIDOL (ISOVUE-370) INJECTION 76% COMPARISON:  Current chest radiograph. Abdomen pelvis CT, 01/25/2013. FINDINGS: CTA CHEST FINDINGS Cardiovascular: Heart normal in size and configuration. Left coronary artery calcifications. No pericardial effusion. Aorta is normal in caliber. No dissection. Minor atherosclerotic plaque noted along the distal arch and descending thoracic aorta. Mild noncalcified plaque in the left subclavian artery with no significant stenosis. Subclavian and carotid arteries are widely patent. Vertebral arteries are widely patent. Mediastinum/Nodes: No neck base or axillary masses or adenopathy. No mediastinal or hilar masses or pathologically enlarged lymph nodes. Trachea and esophagus are  unremarkable. Lungs/Pleura: Minor dependent subsegmental atelectasis. Posterior upper lobe paraseptal emphysema. No lung mass or suspicious nodule. No evidence of pneumonia or pulmonary edema. No pleural effusion or pneumothorax. Musculoskeletal: No fracture or acute finding. No osteoblastic or osteolytic lesions. Review of the MIP images confirms the above findings. CTA ABDOMEN AND PELVIS FINDINGS VASCULAR Aorta: Normal in caliber. Mild atherosclerotic plaque. No significant stenosis. No dissection. Celiac: Patent without evidence of aneurysm, dissection, vasculitis or significant stenosis. SMA: Patent without evidence of aneurysm, dissection, vasculitis or significant stenosis. Renals: Both renal arteries are patent without evidence of aneurysm, dissection, vasculitis, fibromuscular dysplasia or significant stenosis. Single right renal artery. Two left renal arteries. IMA: Patent without evidence of aneurysm, dissection, vasculitis or significant stenosis. Inflow: Patent without evidence of aneurysm, dissection, vasculitis or significant stenosis. Veins: No obvious venous abnormality within the limitations of this arterial phase study. Review of the MIP images confirms the above findings. NON-VASCULAR Hepatobiliary: No focal liver abnormality is seen. Status post cholecystectomy. No biliary dilatation. Pancreas: Unremarkable. No pancreatic ductal dilatation or surrounding inflammatory changes. Spleen: Normal in size without focal abnormality. Adrenals/Urinary Tract: No adrenal masses. Kidneys are normal  in size, orientation and position. 4 mm low-density lesion anterior left kidney midpole, consistent with a cyst. No other renal masses or lesions. No stones. No hydronephrosis. Normal ureters. Bladder is unremarkable. Stomach/Bowel: Stomach is within normal limits. Appendix appears normal. No evidence of bowel wall thickening, distention, or inflammatory changes. Lymphatic: Prominent gastrohepatic ligament lymph  nodes, largest measuring 12 mm in short axis. Scattered subcentimeter retroperitoneal lymph nodes. Reproductive: Unremarkable. Other: No abdominal wall hernia or abnormality. No abdominopelvic ascites. Musculoskeletal: No fracture or acute finding. No osteoblastic or osteolytic lesions. Review of the MIP images confirms the above findings. IMPRESSION: CTA FINDINGS 1. No dissection of the thoracoabdominal aorta.  No aneurysm. 2. Minor atherosclerosis of the thoracic aorta. Mild atherosclerosis of the abdominal aorta. No significant stenosis any of the aortic branch vessels. 3. Left coronary artery calcifications. NON CTA FINDINGS 1. No acute findings within the chest, abdomen or pelvis. 2. Mild upper lobe paraseptal emphysema. Electronically Signed   By: Lajean Manes M.D.   On: 11/30/2017 21:57    My personal review of EKG: Rhythm NSR, nonspecific T wave abnormalities   Assessment & Plan:    Active Problems:   TIA (transient ischemic attack)   1. TIA versus stroke-plan to admit to [facility], will initiate stroke work-up.  Obtain MRI brain in a.m.  Discussed with neurologist on-call Dr. Leonel Ramsay, who states that patient will be evaluated by stroke team in a.m.  Patient just had echocardiogram on 12/10/2017, will not order echo at this time.  Continue aspirin, Brilinta  2. Chest pain-resolved, troponin x2 is negative in the ED.  Will obtain serial troponin every 6 hours.  Patient recently had stents placed 2 weeks ago.  If chest pain recurs consider cardiology consultation in a.m.  3. Hypertension-we will hold Entresto for permissive hypertension.  4. Hyperlipidemia-patient is not on statins due to history of rhabdomyolysis.  5. Hypothyroidism-continue Synthroid   6. Coronary artery disease status post stents x3-stable, continue aspirin, Brilinta.  7. Diabetes mellitus-hold glimepiride, will initiate sliding scale insulin with NovoLog.   Patient be transferred to [facility], discussed with my colleague Dr. Myna Hidalgo who accepted  the patient in transfer  DVT Prophylaxis-   Lovenox   AM Labs Ordered, also please review Full Orders  Family Communication: Admission, patients condition and plan of care including tests being ordered have been discussed with the patient and his sister is at bedside* who indicate understanding and agree with the plan and Code Status.  Code Status: DNR, confirmed with patient in front of his sisters  Admission status: Inpatient  Time spent in minutes : 60 minutes   Oswald Hillock M.D on 12/01/2017 at 12:02 AM  Between 7am to 7pm - Pager - [phone]. After 7pm go to [email or web address] - password [facility]  [facility] - Office  [phone]

## 2017-12-01 NOTE — Sedation Documentation (Signed)
R fem art puncture dressed with gauze/tegaderm. Groin level 0. RDP 4+, RPT +D.

## 2017-12-01 NOTE — Sedation Documentation (Signed)
R Groin level 0, drsg CDI, 4+RDP, RPT D+.

## 2017-12-01 NOTE — Consult Note (Addendum)
Stroke neurology consult note  Referring Physician:  Dr Vista Lawman    Reason for Consult: Stroke  HPI: Devin [name] is an 47 y.o. male with a history of coronary artery disease, EF 35 to 40% with CHF, hyperlipidemia, hypertension, hypothyroidism, tobacco abuse, diabetes mellitus, rhabdomyolysis in the past well on Crestor, dyspnea and asthma who was brought to the emergency department at [facility] on 11/30/2017 with chest pain and left arm numbness. He was seen in consultation by tele-neurology.  When seen his NIH score was a 0.  The patient has had similar symptoms in the past. The patient had undergone cardiac stent placement approximately 3 weeks ago and was on Brilinta PTA.  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  Neuro interventional radiology evaluation was recommended. No family history of strokes.  The patient was transferred to [facility] for further evaluation and treatment.  Date last known well: Friday 11/30/2017 Time last known well: Approximately 1800  tPA Given: No - deficits resolved NIHSS - 0  Past Medical History Past Medical History:  Diagnosis Date  . Asthma   . Chest pain   . Coronary artery disease   . Dyspnea   . GERD (gastroesophageal reflux disease)   . History of kidney stones   . History of rhabdomyolysis   . Hyperlipidemia   . Hypertension   . Hypothyroidism   . Kidney calculus 2014  . Pericardial effusion    Small, by dobutamine echocardiogram, 04/2006  . Tobacco abuse   . Type 2 diabetes mellitus without complications ([place]) 1/44/8185  . Vitamin D deficiency     Surgical History Past Surgical History:  Procedure Laterality Date  . 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 CHOLECYSTECTOMY N/A 01/27/2013   Procedure: LAPAROSCOPIC CHOLECYSTECTOMY;  Surgeon: Jamesetta So, MD;  Location:  [facility];  Service: General;  Laterality: N/A;  . CORONARY STENT INTERVENTION N/A 11/15/2017   Procedure: CORONARY STENT INTERVENTION;  Surgeon: Leonie Man, MD;  Location: [place] CV LAB;  Service: Cardiovascular;  Laterality: N/A;  . HERNIA REPAIR     As a child  . INSERTION OF MESH N/A 11/13/2012   Procedure: INSERTION OF MESH;  Surgeon: Jamesetta So, MD;  Location: [facility];  Service: General;  Laterality: N/A;  . KIDNEY STONE SURGERY    . LEFT HEART CATH AND CORONARY ANGIOGRAPHY N/A 11/15/2017   Procedure: LEFT HEART CATH AND CORONARY ANGIOGRAPHY;  Surgeon: Leonie Man, MD;  Location: [place] CV LAB;  Service: Cardiovascular;  Laterality: N/A;  . PERCUTANEOUS NEPHROLITHOTRIPSY    . UMBILICAL HERNIA REPAIR N/A 11/13/2012   Procedure: UMBILICAL HERNIORRHAPHY;  Surgeon: Jamesetta So, MD;  Location: [facility];  Service: General;  Laterality: N/A;  . VARICOCELECTOMY       Social History:   reports that he has been smoking cigarettes. He started smoking about 32 years ago. He has a 13.00 pack-year smoking history. He quit smokeless tobacco use about 30 years ago.  His smokeless tobacco use included snuff and chew. He reports that he does not drink alcohol or use drugs.  Allergies:  Allergies  Allergen Reactions  . Bee Venom Swelling  . Crestor [Rosuvastatin] Other (See Comments)    rhabdomyolosis    Home Medications:  Medications Prior to Admission  Medication Sig Dispense Refill  . aspirin EC  325 MG tablet Take 650 mg by mouth See admin instructions. Took two doses today prior to admission    . aspirin EC 81 MG EC tablet Take 1 tablet (81 mg total) by mouth daily. 30 tablet 0  . glimepiride (AMARYL) 2 MG tablet Take 1 tablet (2 mg total) by mouth daily with breakfast. 30 tablet 0  . levothyroxine (SYNTHROID, LEVOTHROID) 125 MCG tablet Take 1 tablet (125 mcg total) by mouth daily before breakfast. 30 tablet 0  . pantoprazole (PROTONIX) 20 MG tablet Take 1 tablet (20 mg total)  by mouth daily. 30 tablet 0  . sacubitril-valsartan (ENTRESTO) 24-26 MG Take 1 tablet by mouth 2 (two) times daily. 60 tablet 3  . ticagrelor (BRILINTA) 90 MG TABS tablet Take 1 tablet (90 mg total) by mouth 2 (two) times daily. 60 tablet 0  . Blood Glucose Monitoring Suppl w/Device KIT Dispense based on patient and insurance preference. Check blood sugar daily before breakfast (ICD9 250.0) 1 each 0  . nitroGLYCERIN (NITROSTAT) 0.4 MG SL tablet Place 0.4 mg under the tongue every 5 (five) minutes as needed for chest pain.      Hospital Medications .  stroke: mapping our early stages of recovery book   Does not apply Once  . aspirin  81 mg Oral Daily  . enoxaparin (LOVENOX) injection  40 mg Subcutaneous Q24H  . ezetimibe  10 mg Oral Daily  . insulin aspart  0-9 Units Subcutaneous TID WC  . levothyroxine  125 mcg Oral QAC breakfast  . pantoprazole  20 mg Oral Daily  . ticagrelor  90 mg Oral BID    ROS:  History obtained from the patient   General ROS: Positive for headaches and posterior neck pain Psychological ROS: negative for - behavioral disorder, hallucinations, memory difficulties, mood swings or suicidal ideation Ophthalmic ROS: negative for - blurry vision, double vision, eye pain or loss of vision ENT ROS: Positive for epistaxis, pain and decreased hearing in the right ear Allergy and Immunology ROS: negative for - hives or itchy/watery eyes Hematological and Lymphatic ROS: negative for - bleeding problems, bruising or swollen lymph nodes Endocrine ROS: negative for - galactorrhea, hair pattern changes, polydipsia/polyuria or temperature intolerance Respiratory ROS: negative for - cough, hemoptysis, shortness of breath or wheezing Cardiovascular ROS: negative for - chest pain, dyspnea on exertion, edema or irregular heartbeat Gastrointestinal ROS: negative for - abdominal pain, diarrhea, hematemesis, nausea/vomiting or stool incontinence Genito-Urinary ROS: negative for -  dysuria, hematuria, incontinence or urinary frequency/urgency Musculoskeletal ROS: negative for - joint swelling or muscular weakness Neurological ROS: as noted in HPI Dermatological ROS: negative for rash and skin lesion changes   Physical Examination: Vitals:   12/01/17 0421 12/01/17 0608 12/01/17 0808 12/01/17 0829  BP: 116/80 (!) 104/57 118/73 120/77  Pulse: 61 66  61  Resp: _0 Temp:   (!) 97.5 F (36.4 C) 98.3 F (36.8 C)  TempSrc:   Oral Oral  SpO2:  97% 97% 99%  Weight:      Height:        General -large pleasant male in bed in no acute distress. Heart - Regular rate and rhythm -distant heart sounds Lungs -decreased breath sounds throughout Abdomen - Soft - non tender Extremities - Distal pulses intact - no edema Skin - Warm and dry   Neurologic Examination:  Mental Status: Alert, oriented, thought content appropriate.  Speech fluent without evidence of aphasia. Able to follow 3 step commands without difficulty. Cranial  Nerves: II: Discs not visualized; Visual fields grossly normal, pupils equal, round, reactive to light III,IV, VI: ptosis not present, extra-ocular motions intact bilaterally V,VII: smile symmetric, facial light touch sensation normal bilaterally VIII: hearing normal bilaterally IX,X: gag reflex present XI: bilateral shoulder shrug XII: midline tongue extension Motor: RUE - 5/5    LUE - 5/5, mild give away at hand grip  RLE - 5/5    LLE - 5/5, mild give away at [facility] Tone and bulk:normal tone throughout; no atrophy noted Sensory: Slightly decreased sensation to light touch on the left upper and lower extremities Deep Tendon Reflexes: 2+ and symmetric throughout Plantars: Right: downgoing   Left: downgoing Cerebellar: normal finger-to-nose and normal heel-to-shin test Gait: deferred at this time.     LABORATORY STUDIES:  Basic Metabolic Panel: Recent Labs  Lab 11/30/17 1928 11/30/17 2043 12/01/17 0248  NA 140 140  --   K 3.9  3.8  --   CL 106 106  --   CO2 23  --   --   GLUCOSE 127* 117*  --   BUN 22* 24*  --   CREATININE 1.36* 1.30* 1.18  CALCIUM 9.9  --   --     Liver Function Tests: No results for input(s): AST, ALT, ALKPHOS, BILITOT, PROT, ALBUMIN in the last 168 hours. No results for input(s): LIPASE, AMYLASE in the last 168 hours. No results for input(s): AMMONIA in the last 168 hours.  CBC: Recent Labs  Lab 11/30/17 1928 11/30/17 2043 12/01/17 0248  WBC 10.7*  --  10.3  NEUTROABS 6.0  --   --   HGB 13.5 16.3 12.5*  HCT 40.8 48.0 39.0  MCV 90.9  --  92.4  PLT 296  --  284    Cardiac Enzymes: Recent Labs  Lab 12/01/17 0248 12/01/17 0742  TROPONINI <0.03 <0.03    BNP: Invalid input(s): POCBNP  CBG: Recent Labs  Lab 12/01/17 0605  GLUCAP 117*    Microbiology:   Coagulation Studies: Recent Labs    11/30/17 1928  LABPROT 13.3  INR 1.02    Urinalysis:  Recent Labs  Lab 11/30/17 2350  COLORURINE YELLOW  LABSPEC >1.046*  PHURINE 5.0  GLUCOSEU NEGATIVE  HGBUR MODERATE*  BILIRUBINUR NEGATIVE  KETONESUR NEGATIVE  PROTEINUR 30*  NITRITE NEGATIVE  LEUKOCYTESUR NEGATIVE    Lipid Panel:     Component Value Date/Time   CHOL 287 (H) 12/01/2017 0248   TRIG 300 (H) 12/01/2017 0248   HDL 22 (L) 12/01/2017 0248   CHOLHDL 13.0 12/01/2017 0248   VLDL 60 (H) 12/01/2017 0248   LDLCALC 205 (H) 12/01/2017 0248    HgbA1C:  Lab Results  Component Value Date   HGBA1C 8.5 (H) 12/01/2017    Urine Drug Screen:      Component Value Date/Time   LABOPIA NONE DETECTED 11/30/2017 2350   COCAINSCRNUR NONE DETECTED 11/30/2017 2350   LABBENZ NONE DETECTED 11/30/2017 2350   AMPHETMU NONE DETECTED 11/30/2017 2350   THCU NONE DETECTED 11/30/2017 2350   LABBARB NONE DETECTED 11/30/2017 2350     Alcohol Level:  Recent Labs  Lab 11/30/17 1928  ETH <10    Miscellaneous labs:  EKG  EKG - SR rate 70 BPM - See cardiology reading for full details.   IMAGING:  Ct Angio  Head W Or Wo Contrast Ct Angio Neck W And/or Wo Contrast 11/30/2017 IMPRESSION:   CT HEAD:  1. 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 Suspected small RIGHT cerebellar infarct.  2.  Otherwise negative  CTA NECK:  1. No acute vascular process.  2. 50% stenosis LEFT ICA.  Patent vertebral arteries.   CTA HEAD:  1. Limited by venous phase.  2. 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 Attenuated bilateral PCA (potentially obscured by venous structures).  Recommend neuro interventional consultation.    Mr Brain Wo Contrast 12/01/2017 IMPRESSION:  1. No acute intracranial abnormality. Small chronic left cerebellar infarct.  2. Evidence of substantial basilar artery atherosclerosis, concordant with the high-grade vertebrobasilar stenosis demonstrated by CTA yesterday.     Ct Angio Chest/abd/pel For Dissection W And/or Wo Contrast 11/30/2017 IMPRESSION:   CTA FINDINGS  1. No dissection of the thoracoabdominal aorta.  No aneurysm.  2. Minor atherosclerosis of the thoracic aorta. Mild atherosclerosis of the abdominal aorta. No significant stenosis any of the aortic branch vessels.  3. Left coronary artery calcifications.  NON CTA FINDINGS  1. No acute findings within the chest, abdomen or pelvis.  2. Mild upper lobe paraseptal emphysema.    Dg Chest 2 View 11/30/2017 IMPRESSION:  No active cardiopulmonary disease.    Assessment: 47 y.o. male with PMH of CAD s/p stent 2 wks ago on ASA and brilinta, HTN, HLD, DM, smoker and obese presented with left arm and leg numbness, weakness, dizziness, double vision. Symptoms resolved on presentation. NIHSS = 0. Not tPA candidate. CTA head and neck showed segmental proximal BA and b/l V4 and VBJ high grade stenosis. CTA CAP showed no dissection. EF 35-40%. UDS neg. MRI no acute infarct. LDL 205, statin allergy due to rhabdo. A1C 8.5, uncontrolled DM.   Pt symptoms  likely TIA due to BA and VA stenosis given several uncontrolled risk factors including DM, HLD, obesity, smoker, CAD, possible OSA.  Plan:  Consult to IR for diagnostic angio  Will decide further intervention after diagnostic angio  Prophylactic therapy - continue ASA and brilinta as per cardiology  Put on zetia for HLD  Pt needs to follow up with lipid clinic to consider PCSK9 inhibitors  Pt no insurance, needs social worker consult for disability.   Allow for permissive hypertension for the first 24-48h - and then BP goal 130-160 due to BA and VA high grade stenosis  SSI and CBG monitoring, avoid hypoglycemia  Continue risk factor modification.  Telemetry monitoring  Frequent neuro checks  Fall Precautions  DVT prophelaxis  Keep NPO for now for angio procedure.    Rosalin Hawking, MD PhD Stroke Neurology 12/01/2017 12:47 PM

## 2017-12-01 NOTE — Sedation Documentation (Signed)
250cc NS bolus compete.

## 2017-12-01 NOTE — ED Notes (Signed)
[facility] arrived [facility]

## 2017-12-01 NOTE — Sedation Documentation (Addendum)
250cc NS Bolus started per V.O. Dr. Corliss Skainseveshwar for BUN 22 Cr 1.36 GFR >60.

## 2017-12-01 NOTE — Progress Notes (Signed)
PT Cancellation Note  Patient Details Name: Devin [name] MRN: [id number] DOB: 1971-02-28   Cancelled Treatment:    Reason Eval/Treat Not Completed: Medical issues which prohibited therapy. Pt on bedrest until 1800. Will check back tomorrow.    Mylea Roarty L Parker Wherley 12/01/2017, 3:35 PM

## 2017-12-01 NOTE — Sedation Documentation (Signed)
Attempted to call report. Nurse off floor.

## 2017-12-01 NOTE — Progress Notes (Signed)
[facility]                                                                              Patient Demographics  Devin [name], is a 47 y.o. male, DOB - November 13, 1970, ZOX:[id number]  Admit date - 11/30/2017   Admitting Physician Briscoe Deutscherimothy S Opyd, MD  Outpatient Primary MD for the patient is Patient, No Pcp Per  Outpatient specialists:   LOS - 1  days    Chief Complaint  Patient presents with  . Chest Pain       Brief summary  Devin [name]  is a 47 y.o. male, with history of CAD status post stent placement x3, two weeks ago, presenting with sudden onset of left-sided chest pain [facility]. Symptoms resolving the ED, and [facility] evaluation and management. 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  Patient was found this morning, he is asymptomatic and would like to eat. Case is discussed with neurologist Dr. Deno Etiennehu and interventional radiology consult for diagnostic angiogram and further workup based on results.      Objective:   Vitals:   12/01/17 0230 12/01/17 0421 12/01/17 0608 12/01/17 0829  BP: 123/82 116/80 (!) 104/57 120/77  Pulse: (!) 56 61 66 61  Resp: 20 17 18 20   Temp:    98.3 F (36.8 C)  TempSrc: Oral   Oral  SpO2: 98%  97% 99%  Weight: 125.8 kg     Height: 6\' 6"  (1.981 m)      No intake or output data in the 24 hours ending 12/01/17 1026   Wt Readings from Last 3 Encounters:  12/01/17 125.8 kg  11/21/17 133.4 kg  11/16/17 133 kg     Data Reviewed:  I have personally reviewed following labs and imaging studies  Micro Results No results found for this or any previous visit (from the past 240 hour(s)).  Radiology Reports Ct Angio Head W Or Wo Contrast  Result Date: 11/30/2017 CLINICAL DATA:  Chest pain, LEFT arm paresthesias. History of diabetes,  hypertension, hyperlipidemia. EXAM: CT ANGIOGRAPHY HEAD AND NECK TECHNIQUE: Multidetector CT imaging of the head and neck was performed using the standard protocol during bolus administration of intravenous contrast. Multiplanar CT image reconstructions and MIPs were obtained to evaluate the vascular anatomy. Carotid stenosis measurements (when applicable) are obtained utilizing NASCET criteria, using the distal internal carotid diameter as the denominator. CONTRAST:  75 cc ISOVUE-370 IOPAMIDOL (ISOVUE-370) INJECTION 76% COMPARISON:  None. FINDINGS: CT HEAD FINDINGS BRAIN: No intraparenchymal hemorrhage, mass effect nor midline shift. The ventricles and sulci are normal. No acute large vascular territory infarcts. No abnormal extra-axial fluid collections. 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 Possible small RIGHT cerebellar infarct. Basal cisterns are patent. VASCULAR: Unremarkable. SKULL/SOFT TISSUES: No skull fracture. Small RIGHT parietal convexity hematoma versus scarring. ORBITS/SINUSES: The included ocular globes and orbital contents are normal.Mild lobulated ethmoid mucosal thickening. Mastoid air cells are well aerated. OTHER: None. CTA NECK FINDINGS: AORTIC ARCH:  Normal appearance of the thoracic arch, normal branch pattern. The origins of the innominate, left Common carotid artery and subclavian artery are patent, mild intimal thickening calcific atherosclerosis. RIGHT CAROTID SYSTEM: Common carotid artery is patent, mild intimal thickening. Mild calcific atherosclerosis of the carotid bifurcation without hemodynamically significant stenosis by NASCET criteria. Normal appearance of the internal carotid artery. LEFT CAROTID SYSTEM: Common carotid artery is patent. Intimal thickening calcific atherosclerosis resultant 50% stenosis LEFT internal carotid artery origin by NASCET criteria. Patent LEFT internal carotid artery. VERTEBRAL ARTERIES:Codominant patent vertebral arteries without luminal irregularity.  And SKELETON: No acute osseous process though bone windows have not been submitted. Multiple absent teeth. No advanced cervical spondylosis. OTHER NECK: Soft tissues of the neck are nonacute though, not tailored for evaluation. UPPER CHEST: Please see CT of chest from same day, reported separately. CTA HEAD FINDINGS (venous contamination): ANTERIOR CIRCULATION: Patent cervical internal carotid arteries, petrous, cavernous and supra clinoid internal carotid arteries. Patent anterior communicating artery. Patent anterior and middle cerebral arteries. No large vessel occlusion, significant stenosis, contrast extravasation or aneurysm. POSTERIOR CIRCULATION: Severe stenosis LEFT proximal V4 segment occluded distally. Distal RIGHT V4 occlusion. Occluded proximal basilar artery with immediate though thready reconstitution. Patent PICA. Poorly demonstrated remaining main branch vessels. Tenuous, thready posterior cerebral arteries, diminutive RIGHT P com. No contrast extravasation or aneurysm. VENOUS SINUSES: Major dural venous sinuses are patent though not tailored for evaluation on this angiographic examination. ANATOMIC VARIANTS: None. DELAYED PHASE: No abnormal intracranial enhancement. MIP images reviewed. IMPRESSION: CT HEAD: 1. 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 Suspected small RIGHT cerebellar infarct. 2. Otherwise negative CT HEAD with and without contrast. CTA NECK: 1. No acute vascular process. 2. 50% stenosis LEFT ICA.  Patent vertebral arteries. CTA HEAD: 1. Limited by venous phase. 2. 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 Attenuated bilateral PCA (potentially obscured by venous structures). Recommend neuro interventional consultation. Critical Value/emergent results were called by telephone at the time of interpretation on 11/30/2017 at 10:15 pm to Dr. Kennis Carina , who verbally acknowledged these results. Electronically  Signed   By: Awilda Metro M.D.   On: 11/30/2017 22:20   Dg Chest 2 View  Result Date: 11/30/2017 CLINICAL DATA:  Pt with left side, left arm pain, SOB starting today. Pt passed out during exam. Hx of CAD, DM. Smoker. EXAM: CHEST - 2 VIEW COMPARISON:  11/13/2017 FINDINGS: Cardiac silhouette is normal in size and configuration. Normal mediastinal and hilar contours. Clear lungs.  No pleural effusion or pneumothorax. Skeletal structures are intact. IMPRESSION: No active cardiopulmonary disease. Electronically Signed   By: Amie Portland M.D.   On: 11/30/2017 19:34   Dg Chest 2 View  Result Date: 11/13/2017 CLINICAL DATA:  Chest pain, shortness of breath, chest heaviness EXAM: CHEST - 2 VIEW COMPARISON:  04/28/2017 FINDINGS: Normal heart size, mediastinal contours, and pulmonary vascularity. Lungs clear. No pleural effusion or pneumothorax. Bones unremarkable. IMPRESSION: Normal exam. Electronically Signed   By: Ulyses Southward M.D.   On: 11/13/2017 16:12   Ct Angio Neck W And/or Wo Contrast  Result Date: 11/30/2017 CLINICAL DATA:  Chest pain, LEFT arm paresthesias. History of diabetes, hypertension, hyperlipidemia. EXAM: CT ANGIOGRAPHY HEAD AND NECK TECHNIQUE: Multidetector CT imaging of the head and neck was performed using the standard protocol during bolus administration of intravenous contrast. Multiplanar CT image reconstructions and MIPs were obtained to evaluate the vascular anatomy. Carotid stenosis measurements (when applicable) are obtained utilizing NASCET criteria, using the distal internal  carotid diameter as the denominator. CONTRAST:  75 cc ISOVUE-370 IOPAMIDOL (ISOVUE-370) INJECTION 76% COMPARISON:  None. FINDINGS: CT HEAD FINDINGS BRAIN: No intraparenchymal hemorrhage, mass effect nor midline shift. The ventricles and sulci are normal. No acute large vascular territory infarcts. No abnormal extra-axial fluid collections. 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 Possible small RIGHT cerebellar  infarct. Basal cisterns are patent. VASCULAR: Unremarkable. SKULL/SOFT TISSUES: No skull fracture. Small RIGHT parietal convexity hematoma versus scarring. ORBITS/SINUSES: The included ocular globes and orbital contents are normal.Mild lobulated ethmoid mucosal thickening. Mastoid air cells are well aerated. OTHER: None. CTA NECK FINDINGS: AORTIC ARCH: Normal appearance of the thoracic arch, normal branch pattern. The origins of the innominate, left Common carotid artery and subclavian artery are patent, mild intimal thickening calcific atherosclerosis. RIGHT CAROTID SYSTEM: Common carotid artery is patent, mild intimal thickening. Mild calcific atherosclerosis of the carotid bifurcation without hemodynamically significant stenosis by NASCET criteria. Normal appearance of the internal carotid artery. LEFT CAROTID SYSTEM: Common carotid artery is patent. Intimal thickening calcific atherosclerosis resultant 50% stenosis LEFT internal carotid artery origin by NASCET criteria. Patent LEFT internal carotid artery. VERTEBRAL ARTERIES:Codominant patent vertebral arteries without luminal irregularity. And SKELETON: No acute osseous process though bone windows have not been submitted. Multiple absent teeth. No advanced cervical spondylosis. OTHER NECK: Soft tissues of the neck are nonacute though, not tailored for evaluation. UPPER CHEST: Please see CT of chest from same day, reported separately. CTA HEAD FINDINGS (venous contamination): ANTERIOR CIRCULATION: Patent cervical internal carotid arteries, petrous, cavernous and supra clinoid internal carotid arteries. Patent anterior communicating artery. Patent anterior and middle cerebral arteries. No large vessel occlusion, significant stenosis, contrast extravasation or aneurysm. POSTERIOR CIRCULATION: Severe stenosis LEFT proximal V4 segment occluded distally. Distal RIGHT V4 occlusion. Occluded proximal basilar artery with immediate though thready reconstitution. Patent  PICA. Poorly demonstrated remaining main branch vessels. Tenuous, thready posterior cerebral arteries, diminutive RIGHT P com. No contrast extravasation or aneurysm. VENOUS SINUSES: Major dural venous sinuses are patent though not tailored for evaluation on this angiographic examination. ANATOMIC VARIANTS: None. DELAYED PHASE: No abnormal intracranial enhancement. MIP images reviewed. IMPRESSION: CT HEAD: 1. 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 Suspected small RIGHT cerebellar infarct. 2. Otherwise negative CT HEAD with and without contrast. CTA NECK: 1. No acute vascular process. 2. 50% stenosis LEFT ICA.  Patent vertebral arteries. CTA HEAD: 1. Limited by venous phase. 2. 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 Attenuated bilateral PCA (potentially obscured by venous structures). Recommend neuro interventional consultation. Critical Value/emergent results were called by telephone at the time of interpretation on 11/30/2017 at 10:15 pm to Dr. Kennis Carina , who verbally acknowledged these results. Electronically Signed   By: Awilda Metro M.D.   On: 11/30/2017 22:20   Mr Brain Wo Contrast  Result Date: 12/01/2017 CLINICAL DATA:  47 year old male with left arm paresthesias. 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 EXAM: MRI HEAD WITHOUT CONTRAST TECHNIQUE: Multiplanar, multiecho pulse sequences of the brain and surrounding structures were obtained without intravenous contrast. COMPARISON:  CTA head and neck, head CT 11/30/2017. FINDINGS: Brain: No restricted diffusion or evidence of acute infarction. Small chronic left cerebellar infarct with hemosiderin. No other cerebellar or brainstem signal abnormality. Minimal for age nonspecific cerebral white matter T2 and FLAIR hyperintensity. No cerebral cortical encephalomalacia. No other chronic  cerebral blood products. Deep gray matter nuclei are within normal limits. No midline shift, mass effect, evidence  of mass lesion, ventriculomegaly, extra-axial collection or acute intracranial hemorrhage. Cervicomedullary junction within normal limits. Pituitary at the upper limits of normal. Vascular: Major intracranial vascular flow voids are preserved, although there is evidence of basilar artery atherosclerosis and stenosis (series 15, image 16). Skull and upper cervical spine: Negative visible cervical spine. Visualized bone marrow signal is within normal limits. Sinuses/Orbits: Normal orbits soft tissues. Mild ethmoid sinus mucosal thickening. Other: Mastoids are well pneumatized. Visible internal auditory structures appear normal. Scalp and face soft tissues appear negative. IMPRESSION: 1. No acute intracranial abnormality. Small chronic left cerebellar infarct. 2. Evidence of substantial basilar artery atherosclerosis, concordant with the high-grade vertebrobasilar stenosis demonstrated by CTA yesterday. Neuro-Interventional Radiology consultation is suggested to evaluate the appropriateness of potential treatment. Non-emergent evaluation can be arranged by calling [phone] during usual hours. Emergency evaluation can be requested by paging [phone]. Electronically Signed   By: Odessa Fleming M.D.   On: 12/01/2017 09:52   Ct Coronary Morph W/cta Cor W/score W/ca W/cm &/or Wo/cm  Addendum Date: 11/15/2017   ADDENDUM REPORT: 11/15/2017 08:14 EXAM: OVER-READ INTERPRETATION  CT CHEST The following report is an over-read performed by radiologist Dr. Deberah Pelton [facility] on 11/15/2017. This over-read does not include interpretation of cardiac or coronary anatomy or pathology. The cardiac/coronary CT interpretation by the cardiologist is attached. COMPARISON:  None. FINDINGS: The visualized portions of the lower lung fields show no suspicious nodules, masses, or infiltrates. The visualized  portions of the mediastinum and chest wall are unremarkable. IMPRESSION: No significant non-cardiac abnormality in visualized portion of the thorax. Electronically Signed   By: Myles Rosenthal M.D.   On: 11/15/2017 08:14   Result Date: 11/15/2017 CLINICAL DATA:  47 year old-male with known non-obstructive CAD on a cardiac catheterization in 2014, calcium score at the time 65. EXAM: Cardiac/Coronary  CT TECHNIQUE: The patient was scanned on a [employer]. FINDINGS: A 120 kV prospective scan was triggered in the descending thoracic aorta at 111 HU's. Axial non-contrast 3 mm slices were carried out through the heart. The data set was analyzed on a dedicated work station and scored using the Agatson method. Gantry rotation speed was 250 msecs and collimation was .6 mm. No beta blockade and 0.4 mg of sl NTG was given. The 3D data set was reconstructed in 5% intervals of the 67-82 % of the R-R cycle. Diastolic phases were analyzed on a dedicated work station using MPR, MIP and VRT modes. The patient received 80 cc of contrast. Aorta:  Normal size.  No calcifications.  No dissection. Aortic Valve:  [place]leaf[place].  No calcifications. Coronary Arteries:  Normal coronary origin.  Left dominance. Left main is a large artery that gives rise to LAD and LCX arteries. Left main has no significant stenosis. LAD is a large vessel that is severely diffusely diseased. There is a complex, long severe, predominantly non-calcified plaque in the proximal LAD with stenoses > 70%, this lesion has high risks features. Mid LAD has severe diffuse mostly calcified plaque with a focal stenosis of at least 50-69% but possibly > 70%, distal LAD has small lumen and diffuse plaque. D1 is severely diffusely diseased. LCX is large dominant artery that gives rise to two OM branches, PDA and PLA. There is mild non-calcified plaque in the proximal portion with stenosis 25-50%. OM1 has no significant stenosis. OM2 has ostial 50-69%. PDA is poorly  visualized and has possible severe stenosis in the ostial portion. RCA is a small non-dominant artery that is diffusely diseased  and occluded in the proximal portion. Other findings: Normal pulmonary vein drainage into the left atrium. Normal let atrial appendage without a thrombus. IMPRESSION: 1. Coronary calcium score of 259. This was 45 percentile for age and sex matched control. 2. Normal coronary origin with left dominance. 3. Study quality affected by patient's size and suboptimal vasodilation. However, there is a severe high risk plaque in the proximal LAD with stenosis > 70% and possible severe stenosis in the mid LAD and first diagonal artery. A cardiac catheterization is recommended. 4. Dilated pulmonary artery measuring 34 mm suggestive of pulmonary hypertension. Electronically Signed: By: Tobias Alexander On: 11/14/2017 17:59   Ct Angio Chest/abd/pel For Dissection W And/or Wo Contrast  Result Date: 11/30/2017 CLINICAL DATA:  Patient complains of crushing chest pain x2 hours with left arm paresthesias and pain. Patient reports taking 2 ASA prior to arrival unable to state how much. He continues to endorse chest pain and SOB. Objectively, patient is diaphoretic and is ill appearing. Cardiac and pulmonary exam unremarkable EXAM: CT ANGIOGRAPHY CHEST, ABDOMEN AND PELVIS TECHNIQUE: Multidetector CT imaging through the chest, abdomen and pelvis was performed using the standard protocol during bolus administration of intravenous contrast. Multiplanar reconstructed images and MIPs were obtained and reviewed to evaluate the vascular anatomy. CONTRAST:  ISOVUE-370 IOPAMIDOL (ISOVUE-370) INJECTION 76%, <See Chart> ISOVUE-370 IOPAMIDOL (ISOVUE-370) INJECTION 76% COMPARISON:  Current chest radiograph. Abdomen pelvis CT, 01/25/2013. FINDINGS: CTA CHEST FINDINGS Cardiovascular: Heart normal in size and configuration. Left coronary artery calcifications. No pericardial effusion. Aorta is normal in caliber. No  dissection. Minor atherosclerotic plaque noted along the distal arch and descending thoracic aorta. Mild noncalcified plaque in the left subclavian artery with no significant stenosis. Subclavian and carotid arteries are widely patent. Vertebral arteries are widely patent. Mediastinum/Nodes: No neck base or axillary masses or adenopathy. No mediastinal or hilar masses or pathologically enlarged lymph nodes. Trachea and esophagus are unremarkable. Lungs/Pleura: Minor dependent subsegmental atelectasis. Posterior upper lobe paraseptal emphysema. No lung mass or suspicious nodule. No evidence of pneumonia or pulmonary edema. No pleural effusion or pneumothorax. Musculoskeletal: No fracture or acute finding. No osteoblastic or osteolytic lesions. Review of the MIP images confirms the above findings. CTA ABDOMEN AND PELVIS FINDINGS VASCULAR Aorta: Normal in caliber. Mild atherosclerotic plaque. No significant stenosis. No dissection. Celiac: Patent without evidence of aneurysm, dissection, vasculitis or significant stenosis. SMA: Patent without evidence of aneurysm, dissection, vasculitis or significant stenosis. Renals: Both renal arteries are patent without evidence of aneurysm, dissection, vasculitis, fibromuscular dysplasia or significant stenosis. Single right renal artery. Two left renal arteries. IMA: Patent without evidence of aneurysm, dissection, vasculitis or significant stenosis. Inflow: Patent without evidence of aneurysm, dissection, vasculitis or significant stenosis. Veins: No obvious venous abnormality within the limitations of this arterial phase study. Review of the MIP images confirms the above findings. NON-VASCULAR Hepatobiliary: No focal liver abnormality is seen. Status post cholecystectomy. No biliary dilatation. Pancreas: Unremarkable. No pancreatic ductal dilatation or surrounding inflammatory changes. Spleen: Normal in size without focal abnormality. Adrenals/Urinary Tract: No adrenal masses.  Kidneys are normal in size, orientation and position. 4 mm low-density lesion anterior left kidney midpole, consistent with a cyst. No other renal masses or lesions. No stones. No hydronephrosis. Normal ureters. Bladder is unremarkable. Stomach/Bowel: Stomach is within normal limits. Appendix appears normal. No evidence of bowel wall thickening, distention, or inflammatory changes. Lymphatic: Prominent gastrohepatic ligament lymph nodes, largest measuring 12 mm in short axis. Scattered subcentimeter retroperitoneal lymph nodes. Reproductive: Unremarkable. Other: No abdominal  wall hernia or abnormality. No abdominopelvic ascites. Musculoskeletal: No fracture or acute finding. No osteoblastic or osteolytic lesions. Review of the MIP images confirms the above findings. IMPRESSION: CTA FINDINGS 1. No dissection of the thoracoabdominal aorta.  No aneurysm. 2. Minor atherosclerosis of the thoracic aorta. Mild atherosclerosis of the abdominal aorta. No significant stenosis any of the aortic branch vessels. 3. Left coronary artery calcifications. NON CTA FINDINGS 1. No acute findings within the chest, abdomen or pelvis. 2. Mild upper lobe paraseptal emphysema. Electronically Signed   By: Amie Portland M.D.   On: 11/30/2017 21:57    Lab Data:  CBC: Recent Labs  Lab 11/30/17 1928 11/30/17 2043 12/01/17 0248  WBC 10.7*  --  10.3  NEUTROABS 6.0  --   --   HGB 13.5 16.3 12.5*  HCT 40.8 48.0 39.0  MCV 90.9  --  92.4  PLT 296  --  284   Basic Metabolic Panel: Recent Labs  Lab 11/30/17 1928 11/30/17 2043 12/01/17 0248  NA 140 140  --   K 3.9 3.8  --   CL 106 106  --   CO2 23  --   --   GLUCOSE 127* 117*  --   BUN 22* 24*  --   CREATININE 1.36* 1.30* 1.18  CALCIUM 9.9  --   --    GFR: Estimated Creatinine Clearance: 117.6 mL/min (by C-G formula based on SCr of 1.18 mg/dL). Liver Function Tests: No results for input(s): AST, ALT, ALKPHOS, BILITOT, PROT, ALBUMIN in the last 168 hours. No results  for input(s): LIPASE, AMYLASE in the last 168 hours. No results for input(s): AMMONIA in the last 168 hours. Coagulation Profile: Recent Labs  Lab 11/30/17 1928  INR 1.02   Cardiac Enzymes: Recent Labs  Lab 12/01/17 0248 12/01/17 0742  TROPONINI <0.03 <0.03   BNP (last 3 results) No results for input(s): PROBNP in the last 8760 hours. HbA1C: Recent Labs    12/01/17 0248  HGBA1C 8.5*   CBG: Recent Labs  Lab 12/01/17 0605  GLUCAP 117*   Lipid Profile: Recent Labs    12/01/17 0248  CHOL 287*  HDL 22*  LDLCALC 205*  TRIG 300*  CHOLHDL 13.0   Thyroid Function Tests: No results for input(s): TSH, T4TOTAL, FREET4, T3FREE, THYROIDAB in the last 72 hours. Anemia Panel: No results for input(s): VITAMINB12, FOLATE, FERRITIN, TIBC, IRON, RETICCTPCT in the last 72 hours. Urine analysis:    Component Value Date/Time   COLORURINE YELLOW 11/30/2017 2350   APPEARANCEUR CLEAR 11/30/2017 2350   LABSPEC >1.046 (H) 11/30/2017 2350   PHURINE 5.0 11/30/2017 2350   GLUCOSEU NEGATIVE 11/30/2017 2350   HGBUR MODERATE (A) 11/30/2017 2350   BILIRUBINUR NEGATIVE 11/30/2017 2350   KETONESUR NEGATIVE 11/30/2017 2350   PROTEINUR 30 (A) 11/30/2017 2350   UROBILINOGEN 0.2 10/06/2013 0305   NITRITE NEGATIVE 11/30/2017 2350   LEUKOCYTESUR NEGATIVE 11/30/2017 2350     Jackie Plum M.D. [facility] 12/01/2017, 10:26 AM  Pager: [phone] Between 7am to 7pm - call Pager - [phone]  After 7pm go to [email or web address] - password [email or web address]  Call night coverage person covering after 7pm

## 2017-12-01 NOTE — Consult Note (Signed)
Chief Complaint: Patient was seen in consultation today for diagnostic cerebral arteriogram Chief Complaint  Patient presents with  . Chest Pain    Referring Physician(s): Lama,G/Won,M  Supervising Physician: Luanne Bras  Patient Status: [facility] - [facility]-pt  History of Present Illness: Devin [name] is a 47 y.o. male with history of coronary artery disease, GERD, hypertension, hypothyroidism, tobacco abuse, diabetes who recently presented to [facility] with left sided chest pain radiating down the left arm along with left-sided arm and leg weakness,?syncopal spell.  Initial troponins negative.  He is status post coronary stent x3 approximately 2 weeks ago (on Brilinta/asa).  CT angio head /neck revealed 50% left ICA stenosis along with segmental occlusion of basilar artery and bilateral  vertebral arteries.  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  Patient has since been transferred to [facility] for diagnostic cerebral arteriogram for further evaluation.  Past Medical History:  Diagnosis Date  . Asthma   . Chest pain   . Coronary artery disease   . Dyspnea   . GERD (gastroesophageal reflux disease)   . History of kidney stones   . History of rhabdomyolysis   . Hyperlipidemia   . Hypertension   . Hypothyroidism   . Kidney calculus 2014  . Pericardial effusion    Small, by dobutamine echocardiogram, 04/2006  . Tobacco abuse   . Type 2 diabetes mellitus without complications ([place]) 7/00/1749  . Vitamin D deficiency     Past Surgical History:  Procedure Laterality Date  . 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 CHOLECYSTECTOMY N/A 01/27/2013   Procedure: LAPAROSCOPIC CHOLECYSTECTOMY;  Surgeon: Jamesetta So, MD;  Location: [facility];  Service: General;  Laterality: N/A;  . CORONARY  STENT INTERVENTION N/A 11/15/2017   Procedure: CORONARY STENT INTERVENTION;  Surgeon: Leonie Man, MD;  Location: [place] [facility];  Service: Cardiovascular;  Laterality: N/A;  . HERNIA REPAIR     As a child  . INSERTION OF MESH N/A 11/13/2012   Procedure: INSERTION OF MESH;  Surgeon: Jamesetta So, MD;  Location: [facility];  Service: General;  Laterality: N/A;  . KIDNEY STONE SURGERY    . LEFT HEART CATH AND CORONARY ANGIOGRAPHY N/A 11/15/2017   Procedure: LEFT HEART CATH AND CORONARY ANGIOGRAPHY;  Surgeon: Leonie Man, MD;  Location: [place] [facility];  Service: Cardiovascular;  Laterality: N/A;  . PERCUTANEOUS NEPHROLITHOTRIPSY    . UMBILICAL HERNIA REPAIR N/A 11/13/2012   Procedure: UMBILICAL HERNIORRHAPHY;  Surgeon: Jamesetta So, MD;  Location: [facility];  Service: General;  Laterality: N/A;  . VARICOCELECTOMY      Allergies: Bee venom and Crestor [rosuvastatin]  Medications: Prior to Admission medications   Medication Sig Start Date End Date Taking? Authorizing Provider  aspirin EC 325 MG tablet Take 650 mg by mouth See admin instructions. Took two doses today prior to admission   Yes [provider]  aspirin EC 81 MG EC tablet Take 1 tablet (81 mg total) by mouth daily. 11/17/17  Yes Patrecia Pour, MD  glimepiride (AMARYL) 2 MG tablet Take 1 tablet (2 mg total) by mouth daily with breakfast. 11/16/17  Yes Patrecia Pour, MD  levothyroxine (SYNTHROID, LEVOTHROID) 125 MCG tablet Take 1 tablet (125 mcg total) by mouth daily before breakfast. 11/16/17  Yes Patrecia Pour, MD  pantoprazole ([place]) 20  MG tablet Take 1 tablet (20 mg total) by mouth daily. 11/16/17  Yes Patrecia Pour, MD  sacubitril-valsartan (ENTRESTO) 24-26 MG Take 1 tablet by mouth 2 (two) times daily. 11/26/17  Yes BranchAlphonse Guild, MD  ticagrelor (BRILINTA) 90 MG TABS tablet Take 1 tablet (90 mg total) by mouth 2 (two) times daily. 11/16/17  Yes Patrecia Pour, MD  Blood Glucose Monitoring Suppl w/Device KIT  Dispense based on patient and insurance preference. Check blood sugar daily before breakfast (ICD9 250.0) 04/29/17   Pollina, Gwenyth Allegra, MD  nitroGLYCERIN (NITROSTAT) 0.4 MG SL tablet Place 0.4 mg under the tongue every 5 (five) minutes as needed for chest pain.    [provider]     Family History  Problem Relation Age of Onset  . Coronary artery disease Father   . Hypertension Father   . Hyperlipidemia Father   . Diabetes Father   . Congestive Heart Failure Father 6  . Arrhythmia Father        had an ICD  . Diabetes Mother   . Hypertension Mother   . Hyperlipidemia Mother   . Obesity Mother 64       died after bariatric surgery, liver failure and infection  . Coronary artery disease Maternal Grandfather        both grandfathers and several uncles  . Coronary artery disease Unknown   . Diabetes Sister        both sisters    Social History   Socioeconomic History  . Marital status: Divorced    Spouse name: Not on file  . Number of children: Not on file  . Years of education: Not on file  . Highest education level: Not on file  Occupational History    Comment:      Employer: [employer]  Social Needs  . Financial resource strain: Not on file  . Food insecurity:    Worry: Not on file    Inability: Not on file  . Transportation needs:    Medical: Not on file    Non-medical: Not on file  Tobacco Use  . Smoking status: Current Some Day Smoker    Packs/day: 0.50    Years: 26.00    Pack years: 13.00    Types: Cigarettes    Start date: 03/13/1985  . Smokeless tobacco: Former Systems developer    Types: Snuff, Chew    Quit date: 03/14/1987  . Tobacco comment: started smoking at age 54. chewed and dipped for 2 years  Substance and Sexual Activity  . Alcohol use: No  . Drug use: No  . Sexual activity: Not on file  Lifestyle  . Physical activity:    Days per week: Not on file    Minutes per session: Not on file  . Stress: Not on file  Relationships  . Social  connections:    Talks on phone: Not on file    Gets together: Not on file    Attends religious service: Not on file    Active member of club or organization: Not on file    Attends meetings of clubs or organizations: Not on file    Relationship status: Not on file  Other Topics Concern  . Not on file  Social History Narrative   Eats fast food daily   No exercise outside of work   Lives in [place] with his dog   MAINTENANCE TECHNICIAN      Review of Systems currently denies fever, chest pain, dyspnea, cough, abdominal/back  pain, nausea, vomiting or bleeding.  He does have a mild right posterior headache  Vital Signs: BP 121/70 (BP Location: Left Arm)   Pulse 60   Temp 97.7 F (36.5 C) (Oral)   Resp 19   Ht 6' 6"  (1.981 m)   Wt 277 lb 5.4 oz (125.8 kg)   SpO2 98%   BMI 32.05 kg/m   Physical Exam awake, alert.  Chest clear to ausc bilaterally.  Heart with regular rate and rhythm.  Abdomen obese, soft, positive bowel sounds,NT;  No significant lower extremity edema; face symmetrical, tongue midline, motor function 4/5 in both left upper and lower extremities. 5/5 on right  Imaging: Ct Angio Head W Or Wo Contrast  Result Date: 11/30/2017 CLINICAL DATA:  Chest pain, LEFT arm paresthesias. History of diabetes, hypertension, hyperlipidemia. EXAM: CT ANGIOGRAPHY HEAD AND NECK TECHNIQUE: Multidetector CT imaging of the head and neck was performed using the standard protocol during bolus administration of intravenous contrast. Multiplanar CT image reconstructions and MIPs were obtained to evaluate the vascular anatomy. Carotid stenosis measurements (when applicable) are obtained utilizing NASCET criteria, using the distal internal carotid diameter as the denominator. CONTRAST:  75 cc ISOVUE-370 IOPAMIDOL (ISOVUE-370) INJECTION 76% COMPARISON:  None. FINDINGS: CT HEAD FINDINGS BRAIN: No intraparenchymal hemorrhage, mass effect nor midline shift. The ventricles and sulci are normal. No acute  large vascular territory infarcts. No abnormal extra-axial fluid collections. 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 Possible small RIGHT cerebellar infarct. Basal cisterns are patent. VASCULAR: Unremarkable. SKULL/SOFT TISSUES: No skull fracture. Small RIGHT parietal convexity hematoma versus scarring. ORBITS/SINUSES: The included ocular globes and orbital contents are normal.Mild lobulated ethmoid mucosal thickening. Mastoid air cells are well aerated. OTHER: None. CTA NECK FINDINGS: AORTIC ARCH: Normal appearance of the thoracic arch, normal branch pattern. The origins of the innominate, left Common carotid artery and subclavian artery are patent, mild intimal thickening calcific atherosclerosis. RIGHT CAROTID SYSTEM: Common carotid artery is patent, mild intimal thickening. Mild calcific atherosclerosis of the carotid bifurcation without hemodynamically significant stenosis by NASCET criteria. Normal appearance of the internal carotid artery. LEFT CAROTID SYSTEM: Common carotid artery is patent. Intimal thickening calcific atherosclerosis resultant 50% stenosis LEFT internal carotid artery origin by NASCET criteria. Patent LEFT internal carotid artery. VERTEBRAL ARTERIES:Codominant patent vertebral arteries without luminal irregularity. And SKELETON: No acute osseous process though bone windows have not been submitted. Multiple absent teeth. No advanced cervical spondylosis. OTHER NECK: Soft tissues of the neck are nonacute though, not tailored for evaluation. UPPER CHEST: Please see CT of chest from same day, reported separately. CTA HEAD FINDINGS (venous contamination): ANTERIOR CIRCULATION: Patent cervical internal carotid arteries, petrous, cavernous and supra clinoid internal carotid arteries. Patent anterior communicating artery. Patent anterior and middle cerebral arteries. No large vessel occlusion, significant stenosis, contrast extravasation or aneurysm. POSTERIOR CIRCULATION: Severe stenosis LEFT  proximal V4 segment occluded distally. Distal RIGHT V4 occlusion. Occluded proximal basilar artery with immediate though thready reconstitution. Patent PICA. Poorly demonstrated remaining main branch vessels. Tenuous, thready posterior cerebral arteries, diminutive RIGHT P com. No contrast extravasation or aneurysm. VENOUS SINUSES: Major dural venous sinuses are patent though not tailored for evaluation on this angiographic examination. ANATOMIC VARIANTS: None. DELAYED PHASE: No abnormal intracranial enhancement. MIP images reviewed. IMPRESSION: CT HEAD: 1. 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 Suspected small RIGHT cerebellar infarct. 2. Otherwise negative CT HEAD with and without contrast. CTA NECK: 1. No acute vascular process. 2. 50% stenosis LEFT ICA.  Patent vertebral arteries. CTA HEAD: 1. Limited by venous phase. 2.  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 Attenuated bilateral PCA (potentially obscured by venous structures). Recommend neuro interventional consultation. Critical Value/emergent results were called by telephone at the time of interpretation on 11/30/2017 at 10:15 pm to Dr. Gerlene Fee , who verbally acknowledged these results. Electronically Signed   By: Elon Alas M.D.   On: 11/30/2017 22:20   Dg Chest 2 View  Result Date: 11/30/2017 CLINICAL DATA:  Pt with left side, left arm pain, SOB starting today. Pt passed out during exam. Hx of CAD, DM. Smoker. EXAM: CHEST - 2 VIEW COMPARISON:  11/13/2017 FINDINGS: Cardiac silhouette is normal in size and configuration. Normal mediastinal and hilar contours. Clear lungs.  No pleural effusion or pneumothorax. Skeletal structures are intact. IMPRESSION: No active cardiopulmonary disease. Electronically Signed   By: Lajean Manes M.D.   On: 11/30/2017 19:34   Dg Chest 2 View  Result Date: 11/13/2017 CLINICAL DATA:  Chest pain, shortness of breath, chest heaviness  EXAM: CHEST - 2 VIEW COMPARISON:  04/28/2017 FINDINGS: Normal heart size, mediastinal contours, and pulmonary vascularity. Lungs clear. No pleural effusion or pneumothorax. Bones unremarkable. IMPRESSION: Normal exam. Electronically Signed   By: Lavonia Dana M.D.   On: 11/13/2017 16:12   Ct Angio Neck W And/or Wo Contrast  Result Date: 11/30/2017 CLINICAL DATA:  Chest pain, LEFT arm paresthesias. History of diabetes, hypertension, hyperlipidemia. EXAM: CT ANGIOGRAPHY HEAD AND NECK TECHNIQUE: Multidetector CT imaging of the head and neck was performed using the standard protocol during bolus administration of intravenous contrast. Multiplanar CT image reconstructions and MIPs were obtained to evaluate the vascular anatomy. Carotid stenosis measurements (when applicable) are obtained utilizing NASCET criteria, using the distal internal carotid diameter as the denominator. CONTRAST:  75 cc ISOVUE-370 IOPAMIDOL (ISOVUE-370) INJECTION 76% COMPARISON:  None. FINDINGS: CT HEAD FINDINGS BRAIN: No intraparenchymal hemorrhage, mass effect nor midline shift. The ventricles and sulci are normal. No acute large vascular territory infarcts. No abnormal extra-axial fluid collections. 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 Possible small RIGHT cerebellar infarct. Basal cisterns are patent. VASCULAR: Unremarkable. SKULL/SOFT TISSUES: No skull fracture. Small RIGHT parietal convexity hematoma versus scarring. ORBITS/SINUSES: The included ocular globes and orbital contents are normal.Mild lobulated ethmoid mucosal thickening. Mastoid air cells are well aerated. OTHER: None. CTA NECK FINDINGS: AORTIC ARCH: Normal appearance of the thoracic arch, normal branch pattern. The origins of the innominate, left Common carotid artery and subclavian artery are patent, mild intimal thickening calcific atherosclerosis. RIGHT CAROTID SYSTEM: Common carotid artery is patent, mild intimal thickening. Mild calcific atherosclerosis of the carotid  bifurcation without hemodynamically significant stenosis by NASCET criteria. Normal appearance of the internal carotid artery. LEFT CAROTID SYSTEM: Common carotid artery is patent. Intimal thickening calcific atherosclerosis resultant 50% stenosis LEFT internal carotid artery origin by NASCET criteria. Patent LEFT internal carotid artery. VERTEBRAL ARTERIES:Codominant patent vertebral arteries without luminal irregularity. And SKELETON: No acute osseous process though bone windows have not been submitted. Multiple absent teeth. No advanced cervical spondylosis. OTHER NECK: Soft tissues of the neck are nonacute though, not tailored for evaluation. UPPER CHEST: Please see CT of chest from same day, reported separately. CTA HEAD FINDINGS (venous contamination): ANTERIOR CIRCULATION: Patent cervical internal carotid arteries, petrous, cavernous and supra clinoid internal carotid arteries. Patent anterior communicating artery. Patent anterior and middle cerebral arteries. No large vessel occlusion, significant stenosis, contrast extravasation or aneurysm. POSTERIOR CIRCULATION: Severe stenosis LEFT proximal V4 segment occluded distally. Distal RIGHT V4 occlusion. Occluded proximal basilar artery with  immediate though thready reconstitution. Patent PICA. Poorly demonstrated remaining main branch vessels. Tenuous, thready posterior cerebral arteries, diminutive RIGHT P com. No contrast extravasation or aneurysm. VENOUS SINUSES: Major dural venous sinuses are patent though not tailored for evaluation on this angiographic examination. ANATOMIC VARIANTS: None. DELAYED PHASE: No abnormal intracranial enhancement. MIP images reviewed. IMPRESSION: CT HEAD: 1. 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 Suspected small RIGHT cerebellar infarct. 2. Otherwise negative CT HEAD with and without contrast. CTA NECK: 1. No acute vascular process. 2. 50% stenosis LEFT ICA.  Patent vertebral arteries. CTA HEAD: 1. Limited by venous phase. 2.  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 Attenuated bilateral PCA (potentially obscured by venous structures). Recommend neuro interventional consultation. Critical Value/emergent results were called by telephone at the time of interpretation on 11/30/2017 at 10:15 pm to Dr. Gerlene Fee , who verbally acknowledged these results. Electronically Signed   By: Elon Alas M.D.   On: 11/30/2017 22:20   Mr Brain Wo Contrast  Result Date: 12/01/2017 CLINICAL DATA:  47 year old male with left arm paresthesias. 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 EXAM: MRI HEAD WITHOUT CONTRAST TECHNIQUE: Multiplanar, multiecho pulse sequences of the brain and surrounding structures were obtained without intravenous contrast. COMPARISON:  CTA head and neck, head CT 11/30/2017. FINDINGS: Brain: No restricted diffusion or evidence of acute infarction. Small chronic left cerebellar infarct with hemosiderin. No other cerebellar or brainstem signal abnormality. Minimal for age nonspecific cerebral white matter T2 and FLAIR hyperintensity. No cerebral cortical encephalomalacia. No other chronic cerebral blood products. Deep gray matter nuclei are within normal limits. No midline shift, mass effect, evidence of mass lesion, ventriculomegaly, extra-axial collection or acute intracranial hemorrhage. Cervicomedullary junction within normal limits. Pituitary at the upper limits of normal. Vascular: Major intracranial vascular flow voids are preserved, although there is evidence of basilar artery atherosclerosis and stenosis (series 15, image 16). Skull and upper cervical spine: Negative visible cervical spine. Visualized bone marrow signal is within normal limits. Sinuses/Orbits: Normal orbits soft tissues. Mild ethmoid sinus mucosal thickening. Other: Mastoids are well  pneumatized. Visible internal auditory structures appear normal. Scalp and face soft tissues appear negative. IMPRESSION: 1. No acute intracranial abnormality. Small chronic left cerebellar infarct. 2. Evidence of substantial basilar artery atherosclerosis, concordant with the high-grade vertebrobasilar stenosis demonstrated by CTA yesterday. Neuro-Interventional Radiology consultation is suggested to evaluate the appropriateness of potential treatment. Non-emergent evaluation can be arranged by calling [phone] during usual hours. Emergency evaluation can be requested by paging [phone]. Electronically Signed   By: Genevie Ann M.D.   On: 12/01/2017 09:52   Ct Coronary Morph W/cta Cor W/score W/ca W/cm &/or Wo/cm  Addendum Date: 11/15/2017   ADDENDUM REPORT: 11/15/2017 08:14 EXAM: OVER-READ INTERPRETATION  CT CHEST The following report is an over-read performed by radiologist Dr. Eben Burow [facility] on 11/15/2017. This over-read does not include interpretation of cardiac or coronary anatomy or pathology. The cardiac/coronary CT interpretation by the cardiologist is attached. COMPARISON:  None. FINDINGS: The visualized portions of the lower lung fields show no suspicious nodules, masses, or infiltrates. The visualized portions of the mediastinum and chest wall are unremarkable. IMPRESSION: No significant non-cardiac abnormality in visualized portion of the thorax. Electronically Signed   By: Earle Gell M.D.   On: 11/15/2017 08:14   Result Date: 11/15/2017 CLINICAL DATA:  47 year old-male with known non-obstructive CAD on a cardiac catheterization in 2014, calcium  score at the time 65. EXAM: Cardiac/Coronary  CT TECHNIQUE: The patient was scanned on a [employer]. FINDINGS: A 120 kV prospective scan was triggered in the descending thoracic aorta at 111 HU's. Axial non-contrast 3 mm slices were carried out through the heart. The data set was analyzed on a dedicated work station and  scored using the [place]. Gantry rotation speed was 250 msecs and collimation was .6 mm. No beta blockade and 0.4 mg of sl NTG was given. The 3D data set was reconstructed in 5% intervals of the 67-82 % of the R-R cycle. Diastolic phases were analyzed on a dedicated work station using MPR, MIP and VRT modes. The patient received 80 cc of contrast. Aorta:  Normal size.  No calcifications.  No dissection. Aortic Valve:  Trileaflet.  No calcifications. Coronary Arteries:  Normal coronary origin.  Left dominance. Left main is a large artery that gives rise to LAD and LCX arteries. Left main has no significant stenosis. LAD is a large vessel that is severely diffusely diseased. There is a complex, long severe, predominantly non-calcified plaque in the proximal LAD with stenoses > 70%, this lesion has high risks features. Mid LAD has severe diffuse mostly calcified plaque with a focal stenosis of at least 50-69% but possibly > 70%, distal LAD has small lumen and diffuse plaque. D1 is severely diffusely diseased. LCX is large dominant artery that gives rise to two OM branches, PDA and PLA. There is mild non-calcified plaque in the proximal portion with stenosis 25-50%. OM1 has no significant stenosis. OM2 has ostial 50-69%. PDA is poorly visualized and has possible severe stenosis in the ostial portion. RCA is a small non-dominant artery that is diffusely diseased and occluded in the proximal portion. Other findings: Normal pulmonary vein drainage into the left atrium. Normal let atrial appendage without a thrombus. IMPRESSION: 1. Coronary calcium score of 259. This was 59 percentile for age and sex matched control. 2. Normal coronary origin with left dominance. 3. Study quality affected by patient's size and suboptimal vasodilation. However, there is a severe high risk plaque in the proximal LAD with stenosis > 70% and possible severe stenosis in the mid LAD and first diagonal artery. A cardiac catheterization is  recommended. 4. Dilated pulmonary artery measuring 34 mm suggestive of pulmonary hypertension. Electronically Signed: By: Ena Dawley On: 11/14/2017 17:59   Ct Angio Chest/abd/pel For Dissection W And/or Wo Contrast  Result Date: 11/30/2017 CLINICAL DATA:  Patient complains of crushing chest pain x2 hours with left arm paresthesias and pain. Patient reports taking 2 ASA prior to arrival unable to state how much. He continues to endorse chest pain and SOB. Objectively, patient is diaphoretic and is ill appearing. Cardiac and pulmonary exam unremarkable EXAM: CT ANGIOGRAPHY CHEST, ABDOMEN AND PELVIS TECHNIQUE: Multidetector CT imaging through the chest, abdomen and pelvis was performed using the standard protocol during bolus administration of intravenous contrast. Multiplanar reconstructed images and MIPs were obtained and reviewed to evaluate the vascular anatomy. CONTRAST:  130m ISOVUE-370 IOPAMIDOL (ISOVUE-370) INJECTION 76%, <See Chart> ISOVUE-370 IOPAMIDOL (ISOVUE-370) INJECTION 76% COMPARISON:  Current chest radiograph. Abdomen pelvis CT, 01/25/2013. FINDINGS: CTA CHEST FINDINGS Cardiovascular: Heart normal in size and configuration. Left coronary artery calcifications. No pericardial effusion. Aorta is normal in caliber. No dissection. Minor atherosclerotic plaque noted along the distal arch and descending thoracic aorta. Mild noncalcified plaque in the left subclavian artery with no significant stenosis. Subclavian and carotid arteries are widely patent. Vertebral arteries are widely patent.  Mediastinum/Nodes: No neck base or axillary masses or adenopathy. No mediastinal or hilar masses or pathologically enlarged lymph nodes. Trachea and esophagus are unremarkable. Lungs/Pleura: Minor dependent subsegmental atelectasis. Posterior upper lobe paraseptal emphysema. No lung mass or suspicious nodule. No evidence of pneumonia or pulmonary edema. No pleural effusion or pneumothorax. Musculoskeletal: No  fracture or acute finding. No osteoblastic or osteolytic lesions. Review of the MIP images confirms the above findings. CTA ABDOMEN AND PELVIS FINDINGS VASCULAR Aorta: Normal in caliber. Mild atherosclerotic plaque. No significant stenosis. No dissection. Celiac: Patent without evidence of aneurysm, dissection, vasculitis or significant stenosis. SMA: Patent without evidence of aneurysm, dissection, vasculitis or significant stenosis. Renals: Both renal arteries are patent without evidence of aneurysm, dissection, vasculitis, fibromuscular dysplasia or significant stenosis. Single right renal artery. Two left renal arteries. IMA: Patent without evidence of aneurysm, dissection, vasculitis or significant stenosis. Inflow: Patent without evidence of aneurysm, dissection, vasculitis or significant stenosis. Veins: No obvious venous abnormality within the limitations of this arterial phase study. Review of the MIP images confirms the above findings. NON-VASCULAR Hepatobiliary: No focal liver abnormality is seen. Status post cholecystectomy. No biliary dilatation. Pancreas: Unremarkable. No pancreatic ductal dilatation or surrounding inflammatory changes. Spleen: Normal in size without focal abnormality. Adrenals/Urinary Tract: No adrenal masses. Kidneys are normal in size, orientation and position. 4 mm low-density lesion anterior left kidney midpole, consistent with a cyst. No other renal masses or lesions. No stones. No hydronephrosis. Normal ureters. Bladder is unremarkable. Stomach/Bowel: Stomach is within normal limits. Appendix appears normal. No evidence of bowel wall thickening, distention, or inflammatory changes. Lymphatic: Prominent gastrohepatic ligament lymph nodes, largest measuring 12 mm in short axis. Scattered subcentimeter retroperitoneal lymph nodes. Reproductive: Unremarkable. Other: No abdominal wall hernia or abnormality. No abdominopelvic ascites. Musculoskeletal: No fracture or acute finding. No  osteoblastic or osteolytic lesions. Review of the MIP images confirms the above findings. IMPRESSION: CTA FINDINGS 1. No dissection of the thoracoabdominal aorta.  No aneurysm. 2. Minor atherosclerosis of the thoracic aorta. Mild atherosclerosis of the abdominal aorta. No significant stenosis any of the aortic branch vessels. 3. Left coronary artery calcifications. NON CTA FINDINGS 1. No acute findings within the chest, abdomen or pelvis. 2. Mild upper lobe paraseptal emphysema. Electronically Signed   By: Lajean Manes M.D.   On: 11/30/2017 21:57    Labs:  CBC: Recent Labs    11/15/17 0415 11/16/17 0239 11/30/17 1928 11/30/17 2043 12/01/17 0248  WBC 12.4* 13.1* 10.7*  --  10.3  HGB 12.8* 13.4 13.5 16.3 12.5*  HCT 39.1 40.5 40.8 48.0 39.0  PLT 222 236 296  --  284    COAGS: Recent Labs    11/30/17 1928  INR 1.02  APTT 35    BMP: Recent Labs    11/13/17 1729  11/15/17 0415 11/16/17 0239 11/30/17 1928 11/30/17 2043 12/01/17 0248  NA 136  --  138 135 140 140  --   K 3.5  --  4.0 3.8 3.9 3.8  --   CL 101  --  104 101 106 106  --   CO2 27  --  25 25 23   --   --   GLUCOSE 241*  --  168* 176* 127* 117*  --   BUN 18  --  18 13 22* 24*  --   CALCIUM 9.4  --  9.2 9.3 9.9  --   --   CREATININE 1.55*   < > 1.41* 1.27* 1.36* 1.30* 1.18  GFRNONAA 52*   < >  59* >60 >60  --  >60  GFRAA >60   < > >60 >60 >60  --  >60   < > = values in this interval not displayed.    LIVER FUNCTION TESTS: No results for input(s): BILITOT, AST, ALT, ALKPHOS, PROT, ALBUMIN in the last 8760 hours.  TUMOR MARKERS: No results for input(s): AFPTM, CEA, CA199, CHROMGRNA in the last 8760 hours.  Assessment and Plan: 47 y.o. male with history of coronary artery disease, GERD, hypertension, hypothyroidism, tobacco abuse, diabetes who recently presented to [facility] with left sided chest pain radiating down the left arm along with left-sided arm and leg weakness.  Initial troponins negative.  He  is status post coronary stent x3 approximately 2 weeks ago.  CT angio head /neck revealed 50% left ICA stenosis along with segmental occlusion of basilar artery and bilateral  vertebral arteries.  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  Patient has since been transferred to [facility] for diagnostic cerebral arteriogram for further evaluation.Risks and benefits of procedure were discussed with the patient/family including, but not limited to bleeding, infection, vascular injury or contrast induced renal failure.  This interventional procedure involves the use of X-rays and because of the nature of the planned procedure, it is possible that we will have prolonged use of X-ray fluoroscopy.  Potential radiation risks to you include (but are not limited to) the following: - A slightly elevated risk for cancer  several years later in life. This risk is typically less than 0.5% percent. This risk is low in comparison to the normal incidence of human cancer, which is 33% for women and 50% for men according to the [employer]. - Radiation induced injury can include skin redness, resembling a rash, tissue breakdown / ulcers and hair loss (which can be temporary or permanent).   The likelihood of either of these occurring depends on the difficulty of the procedure and whether you are sensitive to radiation due to previous procedures, disease, or genetic conditions.   IF your procedure requires a prolonged use of radiation, you will be notified and given written instructions for further action.  It is your responsibility to monitor the irradiated area for the 2 weeks following the procedure and to notify your physician if you are concerned that you have suffered a radiation induced injury.    All of the patient's questions were answered, patient is agreeable to proceed.  Consent signed and in chart.     Procedure scheduled for today.    Thank you for this interesting consult.  I greatly enjoyed meeting GARIK DIAMANT and look forward to participating in their care.  A copy of this report was sent to the requesting provider on this date.  Electronically Signed: D. Rowe Robert, PA-C 12/01/2017, 12:14 PM   I spent a total of 25 minutes  in face to face in clinical consultation, greater than 50% of which was counseling/coordinating care for diagnostic cerebral arteriogram

## 2017-12-01 NOTE — Sedation Documentation (Signed)
Pt transported to 3W38 on bed.

## 2017-12-01 NOTE — Sedation Documentation (Addendum)
5Fr sheath removed from R fem artery by Gaspar Skeetersory Sandling, RTR. Hemostasis achieved using manual pressure and VPad. Groin level 0, 4+RDP, RPT + Doppler.

## 2017-12-01 NOTE — Sedation Documentation (Signed)
RIGHT fem artery puncture.  

## 2017-12-01 NOTE — Progress Notes (Signed)
OT Cancellation Note  Patient Details Name: Devin [name] MRN: [id number] DOB: Mar 24, 1970   Cancelled Treatment:    Reason Eval/Treat Not Completed: Medical issues which prohibited therapy. Pt on bedrest until 1800. Will check back tomorrow to initiate OT evaluation.   Chancy Milroyhristie S Kelda Azad, OT Acute Rehabilitation Services Pager [phone] Office [phone]   Chancy MilroyChristie S Mana Morison 12/01/2017, 3:50 PM

## 2017-12-01 NOTE — Procedures (Signed)
S/P $ vessel cerebral arteriogram RT CFA approach. Findings. 1.Occluded RT VBJ just distal to RT PICA. 2.Severe high grade stenosi of RT VA origin. 3.Severe preocclusive tandem stenosis of Lt VBJ, and of prox basilar artery. 4.40 % stenosis of LICA prox.

## 2017-12-01 NOTE — Sedation Documentation (Signed)
Family at bedside to review images with Dr. Corliss Skainseveshwar.

## 2017-12-01 NOTE — Evaluation (Signed)
Speech Language Pathology Evaluation Patient Details Name: Varney DailyCarl L Fait MRN: [id number] DOB: 05/05/1970 Today's Date: 12/01/2017 Time: [id number] SLP Time Calculation (min) (ACUTE ONLY): 27 min  Problem List:  Patient Active Problem List   Diagnosis Date Noted  . Cerebellar stroke (HCC)   . TIA (transient ischemic attack) 11/30/2017  . Abnormal cardiac CT angiography   . Coronary artery disease   . Chest pain due to CAD 11/14/2017  . Chest pain with moderate risk for cardiac etiology   . CKD (chronic kidney disease) 11/13/2017  . Hypothyroidism 10/07/2013  . Hyponatremia 10/07/2013  . Type 2 diabetes mellitus without complications (HCC) 10/07/2013  . Rhabdomyolysis 10/06/2013  . Acute renal failure (HCC) 10/06/2013  . Elevated AST (SGOT) 10/06/2013  . Elevated ALT measurement 10/06/2013  . Myalgia and myositis, unspecified 10/06/2013  . Chest pain   . Tobacco abuse   . Hypertension   . Hyperlipidemia   . Pericardial effusion   . History of rhabdomyolysis    Past Medical History:  Past Medical History:  Diagnosis Date  . Asthma   . Chest pain   . Coronary artery disease   . Dyspnea   . GERD (gastroesophageal reflux disease)   . History of kidney stones   . History of rhabdomyolysis   . Hyperlipidemia   . Hypertension   . Hypothyroidism   . Kidney calculus 2014  . Pericardial effusion    Small, by dobutamine echocardiogram, 04/2006  . Tobacco abuse   . Type 2 diabetes mellitus without complications (HCC) 10/07/2013  . Vitamin D deficiency    Past Surgical History:  Past Surgical History:  Procedure Laterality Date  . 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 CHOLECYSTECTOMY N/A 01/27/2013   Procedure: LAPAROSCOPIC CHOLECYSTECTOMY;  Surgeon: Dalia HeadingMark A Jenkins, MD;  Location: [facility];  Service: General;  Laterality: N/A;  . CORONARY STENT INTERVENTION N/A 11/15/2017   Procedure:  CORONARY STENT INTERVENTION;  Surgeon: Marykay LexHarding, David W, MD;  [facility];  Service: Cardiovascular;  Laterality: N/A;  . HERNIA REPAIR     As a child  . INSERTION OF MESH N/A 11/13/2012   Procedure: INSERTION OF MESH;  Surgeon: Dalia HeadingMark A Jenkins, MD;  Location: [facility];  Service: General;  Laterality: N/A;  . KIDNEY STONE SURGERY    . LEFT HEART CATH AND CORONARY ANGIOGRAPHY N/A 11/15/2017   Procedure: LEFT HEART CATH AND CORONARY ANGIOGRAPHY;  Surgeon: Marykay LexHarding, David W, MD;  [facility];  Service: Cardiovascular;  Laterality: N/A;  . PERCUTANEOUS NEPHROLITHOTRIPSY    . UMBILICAL HERNIA REPAIR N/A 11/13/2012   Procedure: UMBILICAL HERNIORRHAPHY;  Surgeon: Dalia HeadingMark A Jenkins, MD;  Location: [facility];  Service: General;  Laterality: N/A;  . VARICOCELECTOMY     HPI:  47 y.o. male, with history of CAD status post stent placement x3, two weeks ago, came to hospital with sudden onset of left-sided chest pain radiating down the left arm.  He had dizziness and diaphoresis.  During the examination by the ED physician patient was found to have mild weakness of left side including left arm and leg.  Also had numbness.  Code stroke was called.  He was evaluated by tele neurology.  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  [facility].  And recommended to transfer  to [facility] for further evaluation. MRI head 12/01/17 indicated No acute intracranial abnormality. Small chronic left cerebellar infarct.Evidence of substantial basilar artery atherosclerosis, concordant with the high-grade vertebrobasilar stenosis demonstrated by CTA yesterday.  Assessment / Plan / Recommendation Clinical Impression   Pt administered MOCA (Montreal Cognitive Assessment) yielding a score of 26/30 which falls within normal range for this assessment; only deficit noted was during retrieval of delayed recall  for words as pt was able to recall 1/5 correctly, but with min categorization cues, he recalled 3/5 and stated "I have been forgetting things lately." Other areas of the assessment were [facility]; pt's speech was intelligible within complex conversation and he was aware of all functional deficits.  ST will s/o at this time and no treatment is recommended.    SLP Assessment  SLP Recommendation/Assessment: Patient does not need any further Speech Language Pathology Services SLP Visit Diagnosis: Cognitive communication deficit (R41.841)    Follow Up Recommendations  None    Frequency and Duration   Evaluation only        SLP Evaluation Cognition  Overall Cognitive Status: Within Functional Limits for tasks assessed Arousal/Alertness: Awake/alert Orientation Level: Oriented X4 Memory: Impaired Memory Impairment: Retrieval deficit;Decreased short term memory Decreased Short Term Memory: Verbal complex;Functional complex Awareness: Appears intact Problem Solving: Appears intact Safety/Judgment: Appears intact       Comprehension  Auditory Comprehension Overall Auditory Comprehension: Appears within functional limits for tasks assessed Conversation: Complex Visual Recognition/Discrimination Discrimination: Within Function Limits Reading Comprehension Reading Status: Within funtional limits    Expression Expression Primary Mode of Expression: Verbal Verbal Expression Overall Verbal Expression: Appears within functional limits for tasks assessed Initiation: No impairment Level of Generative/Spontaneous Verbalization: Conversation Repetition: No impairment Naming: No impairment Pragmatics: No impairment Non-Verbal Means of Communication: Not applicable Written Expression Dominant Hand: Left Written Expression: Within Functional Limits   Oral / Motor  Oral Motor/Sensory Function Overall Oral Motor/Sensory Function: Within functional limits Motor Speech Overall Motor Speech: Appears  within functional limits for tasks assessed Respiration: Within functional limits Phonation: Normal Resonance: Within functional limits Articulation: Within functional limitis Intelligibility: Intelligible Motor Planning: Witnin functional limits Motor Speech Errors: Not applicable                       Tressie Stalker, M.S., CCC-SLP 12/01/2017, 5:05 PM

## 2017-12-02 LAB — GLUCOSE, CAPILLARY
GLUCOSE-CAPILLARY: 193 mg/dL — AB (ref 70–99)
Glucose-Capillary: 140 mg/dL — ABNORMAL HIGH (ref 70–99)
Glucose-Capillary: 110 mg/dL — ABNORMAL HIGH (ref 70–99)
Glucose-Capillary: 166 mg/dL — ABNORMAL HIGH (ref 70–99)

## 2017-12-02 NOTE — Progress Notes (Signed)
STROKE TEAM PROGRESS NOTE   SUBJECTIVE (INTERVAL HISTORY) His sons are at the bedside.  Patient lying in bed, no complaints, denies any neurological deficit.  BP fluctuate, currently 130s, could be as low as 110s.  On Florinef for BP management, continue on IV fluid at 100 cc/h.  Plan for basilar artery stent in a.m.    OBJECTIVE Vitals:   12/01/17 2004 12/01/17 2344 12/02/17 0350 12/02/17 0806  BP: 131/80 119/67 (!) 105/59 124/88  Pulse: 63 60 63 74  Resp: 18 18 18 20   Temp: 98.2 F (36.8 C) 98.6 F (37 C) 98.4 F (36.9 C) 98.2 F (36.8 C)  TempSrc: Oral Oral Oral Oral  SpO2: 96% 96% 98% 98%  Weight:      Height:        CBC:  Recent Labs  Lab 11/30/17 1928 11/30/17 2043 12/01/17 0248  WBC 10.7*  --  10.3  NEUTROABS 6.0  --   --   HGB 13.5 16.3 12.5*  HCT 40.8 48.0 39.0  MCV 90.9  --  92.4  PLT 296  --  284    Basic Metabolic Panel:  Recent Labs  Lab 11/30/17 1928 11/30/17 2043 12/01/17 0248  NA 140 140  --   K 3.9 3.8  --   CL 106 106  --   CO2 23  --   --   GLUCOSE 127* 117*  --   BUN 22* 24*  --   CREATININE 1.36* 1.30* 1.18  CALCIUM 9.9  --   --     Lipid Panel:     Component Value Date/Time   CHOL 287 (H) 12/01/2017 0248   TRIG 300 (H) 12/01/2017 0248   HDL 22 (L) 12/01/2017 0248   CHOLHDL 13.0 12/01/2017 0248   VLDL 60 (H) 12/01/2017 0248   LDLCALC 205 (H) 12/01/2017 0248   HgbA1c:  Lab Results  Component Value Date   HGBA1C 8.5 (H) 12/01/2017   Urine Drug Screen:     Component Value Date/Time   LABOPIA NONE DETECTED 11/30/2017 2350   COCAINSCRNUR NONE DETECTED 11/30/2017 2350   LABBENZ NONE DETECTED 11/30/2017 2350   AMPHETMU NONE DETECTED 11/30/2017 2350   THCU NONE DETECTED 11/30/2017 2350   LABBARB NONE DETECTED 11/30/2017 2350    Alcohol Level     Component Value Date/Time   ETH <10 11/30/2017 1928    IMAGING  Ct Angio Head W Or Wo Contrast Ct Angio Neck W And/or Wo Contrast 11/30/2017 IMPRESSION:  CT HEAD:  1. 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 Suspected small RIGHT cerebellar infarct.  2. Otherwise negative  CTA NECK:  1. No acute vascular process.  2. 50% stenosis LEFT ICA.  Patent vertebral arteries.  CTA HEAD:  1. Limited by venous phase.  2. 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 Attenuated bilateral PCA (potentially obscured by venous structures). Recommend neuro interventional consultation.    Mr Brain Wo Contrast 12/01/2017 IMPRESSION:  1. No acute intracranial abnormality. Small chronic left cerebellar infarct.  2. Evidence of substantial basilar artery atherosclerosis, concordant with the high-grade vertebrobasilar stenosis demonstrated by CTA yesterday.    Ct Angio Chest/abd/pel For Dissection W And/or Wo Contrast 11/30/2017 IMPRESSION:  CTA FINDINGS  1. No dissection of the thoracoabdominal aorta.  No aneurysm.  2. Minor atherosclerosis of the thoracic aorta. Mild atherosclerosis of the abdominal aorta. No significant stenosis any of the aortic branch vessels.  3. Left coronary artery calcifications.  NON CTA FINDINGS  1. No acute findings within the chest, abdomen or pelvis.  2. Mild upper lobe paraseptal emphysema.   Cerebral Angiogram - Dr Corliss Skains 12/01/2017 Findings. 1.Occluded RT VBJ just distal to RT PICA. 2.Severe high grade stenosi of RT VA origin. 3.Severe preocclusive tandem stenosis of Lt VBJ, and of prox basilar artery. 4.40 % stenosis of LICA prox.  Transthoracic Echocardiogram - Left ventricle: The cavity size was mildly dilated. There was   mild concentric hypertrophy. Systolic function was moderately   reduced. The estimated ejection fraction was in the range of 35%   to 40%. Diffuse hypokinesis. Features are consistent with a   pseudonormal left ventricular filling pattern, with concomitant   abnormal relaxation and increased filling pressure (grade 2   diastolic  dysfunction). Doppler parameters are consistent with   high ventricular filling pressure. - Regional wall motion abnormality: Moderate hypokinesis of the mid   anterior, basal inferior, mid inferolateral, and mid   anterolateral myocardium. - Mitral valve: Mildly calcified annulus. - Atrial septum: No defect or patent foramen ovale was identified.   PHYSICAL EXAM Temp:  [98.1 F (36.7 C)-98.6 F (37 C)] 98.1 F (36.7 C) (09/22 1517) Pulse Rate:  [53-74] 53 (09/22 1517) Resp:  [16-20] 20 (09/22 1517) BP: (105-139)/(59-90) 139/83 (09/22 1517) SpO2:  [96 %-99 %] 99 % (09/22 1517)  General - Well nourished, well developed, in no apparent distress.  Ophthalmologic - fundi not visualized due to noncooperation.  Cardiovascular - Regular rate and rhythm.  Mental Status -  Level of arousal and orientation to time, place, and person were intact. Language including expression, naming, repetition, comprehension was assessed and found intact. Attention span and concentration were normal. Fund of Knowledge was assessed and was intact.  Cranial Nerves II - XII - II - Visual field intact OU. III, IV, VI - Extraocular movements intact. V - Facial sensation intact bilaterally. VII - Facial movement intact bilaterally. VIII - Hearing & vestibular intact bilaterally. X - Palate elevates symmetrically. XI - Chin turning & shoulder shrug intact bilaterally. XII - Tongue protrusion intact.  Motor Strength - The patient's strength was normal in all extremities and pronator drift was absent.  Bulk was normal and fasciculations were absent.   Motor Tone - Muscle tone was assessed at the neck and appendages and was normal.  Reflexes - The patient's reflexes were symmetrical in all extremities and he had no pathological reflexes.  Sensory - Light touch, temperature/pinprick were assessed and were symmetrical.    Coordination - The patient had normal movements in the hands and feet with no ataxia  or dysmetria.  Tremor was absent.  Gait and Station - deferred.    ASSESSMENT/PLAN Devin [name] is a 47 y.o. male with history of coronary artery disease (recent stent), EF 35 to 40% with CHF, hyperlipidemia, hypertension, hypothyroidism, tobacco abuse, diabetes mellitus, rhabdomyolysis in the past well on Crestor, dyspnea and asthma  presenting with Lt arm numbness and chest pain. He did not receive IV t-PA due to resolution of deficits.  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 Suspected small RIGHT cerebellar infarct.   MRI head - Small chronic left cerebellar infarct.   CTA H&N - Segmental occlusion basilar artery and bilateral vertebral arteries   2D Echo EF 35 to 40%  LDL - 205  HgbA1c - 8.5  VTE prophylaxis - Lovenox  Diet  - Carb modified with thin  liquids  aspirin 81 mg daily and Brilinta prior to admission, now on ASA 81 mg daily and Brilinta 90 mg BID  Patient counseled to be compliant with his antithrombotic medications  Ongoing aggressive stroke risk factor management  Therapy recommendations:  Outpt PT recommended  Disposition:  Pending  VA and BA stenosis - symptomatic  DSA showed occluded RT VBJ just distal to RT PICA, severe high grade stenosi of RT VA origin, severe preocclusive tandem stenosis of Lt VBJ, and of prox basilar artery.  Plan for VA and BA stent/angioplasty in a.m.  Check P2Y12 tomorrow  CAD  Status post stent 2 weeks ago  EF 35 to 40%  On aspirin and Brilinta  Close cardiology follow-up  Hypertension  Stable on the low side  Put on Florinef  Continue IV fluid at 100 cc/h . Current BP goal 130-160 given PA/VA high-grade stenosis  Hyperlipidemia  Lipid lowering medication PTA:  None - statin intolerant with rhabdo  LDL 201, goal < 70  Current lipid lowering medication: Zetia 10 mg daily  Continue Zetia at  discharge  Follow with cardiology lipid clinic to consider PCSK9 inhibitors  Diabetes  HgbA1c 8.5, goal < 7.0  Uncontrolled  Hyperglycemia  SSI  CBG monitoring  Patient educated on DM management  Close PCP follow-up for better DM control  Tobacco abuse  Current smoker  Smoking cessation counseling provided  Pt is willing to quit  Other Stroke Risk Factors  Obesity, Body mass index is 32.05 kg/m., recommend weight loss, diet and exercise as appropriate   Hx stroke/TIA - by imaging  Other Active Problems  Elevated creatinine 1.3 - repeat BMP in a.m.   Hospital day # 2  Gazelle Towe, MD PhD [facility] 12/02/2017 5:05 PM    To contact Stroke Continuity provider, please refer to [email or web address]. After hours, contact General Neurology

## 2017-12-02 NOTE — Progress Notes (Signed)
PROGRESS NOTE  NILS THOR  ZOX:[id number] DOB: March 25, 1970  DOA: 11/30/2017 PCP: Patient, No Pcp Per   Brief Narrative:  Jessi Jessop  is a 47 y.o. male, with history of CAD status post stent placement x3, two weeks ago, presenting with sudden onset of left-sided chest pain radiating down the left arm to [facility] ED. Symptoms resolving the ED, and was transferred to [facility] for further evaluation and management. 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   Assessment & Plan:   Active Problems:   TIA (transient ischemic attack)   Cerebellar stroke (HCC)   TIA / embolic basilar artery stenosis :   Head CT - old cerebral infarction MRI brain - small chronic left cerebral infarction CT angiogram head and neck- segmental basilar artery and bilateral vertebral artery occlusion - s/p  Diagnostic angio-  Plan for stent placement tomorrow R PICA 2D echo- EF 35-40% LDL 2 5  A1c 8.5% Neurology/stroke team consult appreciated -  Aggressive stroke risk factor modification.  Continue aspirin with Brilinta.  Hyperlipidemia: Statin intolerant with prior rhabdomyolysis LDL 201, goal < 70 Current lipid lowering medication: Zetia 10 mg daily Follow with cardiology lipid clinic to consider PCSK9 inhibitors  Diabetes  Mellitus type 2: HgbA1c 8.5, goal < 7.0 Uncontrolled Glimepiride on hold SSI Metformin post procedure   CAD: Status post stent 2 weeks ago EF 35 to 40% On aspirin and Brilinta Close cardiology follow-up   DVT prophylaxis: LOVENOX Code Status: DNT Family Communication: PT AND WIFE Disposition Plan: HOME   Consultants:   NEURO  Procedures:   DIAG ANGIO 12/01/2017  Antimicrobials:      Subjective: NO DIZZINESS, NO HEADACHES  Objective:  Vitals:   12/01/17 2004 12/01/17 2344 12/02/17 0350 12/02/17 0806  BP: 131/80 119/67 (!)  105/59 124/88  Pulse: 63 60 63 74  Resp: 18 18 18 20   Temp: 98.2 F (36.8 C) 98.6 F (37 C) 98.4 F (36.9 C) 98.2 F (36.8 C)  TempSrc: Oral Oral Oral Oral  SpO2: 96% 96% 98% 98%  Weight:      Height:        Intake/Output Summary (Last 24 hours) at 12/02/2017 1026 Last data filed at 12/02/2017 0350 Gross per 24 hour  Intake -  Output 1160 ml  Net -1160 ml   Filed Weights   12/01/17 0230  Weight: 125.8 kg    Examination:  General exam: NAD Respiratory system: Clear to auscultation. Respiratory effort normal. Cardiovascular system: S1 & S2 heard, RRR. No JVD, murmurs, rubs, gallops or clicks. No pedal edema. Gastrointestinal system: Abdomen is nondistended, soft and nontender. No organomegaly or masses felt. Normal bowel sounds heard. Central nervous system: Alert and oriented. No focal neurological deficits. Extremities: Symmetric 5 x 5 power. Skin: No rashes, lesions or ulcers Psychiatry: Judgement and insight appear normal. Mood & affect appropriate.     Data Reviewed: I have personally reviewed following labs and imaging studies  CBC: Recent Labs  Lab 11/30/17 1928 11/30/17 2043 12/01/17 0248  WBC 10.7*  --  10.3  NEUTROABS 6.0  --   --   HGB 13.5 16.3 12.5*  HCT 40.8 48.0 39.0  MCV 90.9  --  92.4  PLT 296  --  284   Basic Metabolic Panel: Recent Labs  Lab 11/30/17 1928 11/30/17 2043 12/01/17 0248  NA 140 140  --  K 3.9 3.8  --   CL 106 106  --   CO2 23  --   --   GLUCOSE 127* 117*  --   BUN 22* 24*  --   CREATININE 1.36* 1.30* 1.18  CALCIUM 9.9  --   --    GFR: Estimated Creatinine Clearance: 117.6 mL/min (by C-G formula based on SCr of 1.18 mg/dL). Liver Function Tests: No results for input(s): AST, ALT, ALKPHOS, BILITOT, PROT, ALBUMIN in the last 168 hours. No results for input(s): LIPASE, AMYLASE in the last 168 hours. No results for input(s): AMMONIA in the last 168 hours. Coagulation Profile: Recent Labs  Lab 11/30/17 1928  INR  1.02   Cardiac Enzymes: Recent Labs  Lab 12/01/17 0248 12/01/17 0742 12/01/17 1407  TROPONINI <0.03 <0.03 <0.03   BNP (last 3 results) No results for input(s): PROBNP in the last 8760 hours. HbA1C: Recent Labs    12/01/17 0248  HGBA1C 8.5*   CBG: Recent Labs  Lab 12/01/17 0605 12/01/17 1612 12/01/17 2140 12/02/17 0645  GLUCAP 117* 141* 154* 140*   Lipid Profile: Recent Labs    12/01/17 0248  CHOL 287*  HDL 22*  LDLCALC 205*  TRIG 300*  CHOLHDL 13.0   Thyroid Function Tests: No results for input(s): TSH, T4TOTAL, FREET4, T3FREE, THYROIDAB in the last 72 hours. Anemia Panel: No results for input(s): VITAMINB12, FOLATE, FERRITIN, TIBC, IRON, RETICCTPCT in the last 72 hours.  Sepsis Labs: Recent Labs  Lab 11/30/17 1928 12/01/17 0248  WBC 10.7* 10.3    No results found for this or any previous visit (from the past 240 hour(s)).       Radiology Studies: Ct Angio Head W Or Wo Contrast  Result Date: 11/30/2017 CLINICAL DATA:  Chest pain, LEFT arm paresthesias. History of diabetes, hypertension, hyperlipidemia. EXAM: CT ANGIOGRAPHY HEAD AND NECK TECHNIQUE: Multidetector CT imaging of the head and neck was performed using the standard protocol during bolus administration of intravenous contrast. Multiplanar CT image reconstructions and MIPs were obtained to evaluate the vascular anatomy. Carotid stenosis measurements (when applicable) are obtained utilizing NASCET criteria, using the distal internal carotid diameter as the denominator. CONTRAST:  75 cc ISOVUE-370 IOPAMIDOL (ISOVUE-370) INJECTION 76% COMPARISON:  None. FINDINGS: CT HEAD FINDINGS BRAIN: No intraparenchymal hemorrhage, mass effect nor midline shift. The ventricles and sulci are normal. No acute large vascular territory infarcts. No abnormal extra-axial fluid collections. 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 Possible small RIGHT cerebellar infarct. Basal cisterns are patent. VASCULAR: Unremarkable.  SKULL/SOFT TISSUES: No skull fracture. Small RIGHT parietal convexity hematoma versus scarring. ORBITS/SINUSES: The included ocular globes and orbital contents are normal.Mild lobulated ethmoid mucosal thickening. Mastoid air cells are well aerated. OTHER: None. CTA NECK FINDINGS: AORTIC ARCH: Normal appearance of the thoracic arch, normal branch pattern. The origins of the innominate, left Common carotid artery and subclavian artery are patent, mild intimal thickening calcific atherosclerosis. RIGHT CAROTID SYSTEM: Common carotid artery is patent, mild intimal thickening. Mild calcific atherosclerosis of the carotid bifurcation without hemodynamically significant stenosis by NASCET criteria. Normal appearance of the internal carotid artery. LEFT CAROTID SYSTEM: Common carotid artery is patent. Intimal thickening calcific atherosclerosis resultant 50% stenosis LEFT internal carotid artery origin by NASCET criteria. Patent LEFT internal carotid artery. VERTEBRAL ARTERIES:Codominant patent vertebral arteries without luminal irregularity. And SKELETON: No acute osseous process though bone windows have not been submitted. Multiple absent teeth. No advanced cervical spondylosis. OTHER NECK: Soft tissues of the neck are nonacute though, not tailored for evaluation. UPPER CHEST:  Please see CT of chest from same day, reported separately. CTA HEAD FINDINGS (venous contamination): ANTERIOR CIRCULATION: Patent cervical internal carotid arteries, petrous, cavernous and supra clinoid internal carotid arteries. Patent anterior communicating artery. Patent anterior and middle cerebral arteries. No large vessel occlusion, significant stenosis, contrast extravasation or aneurysm. POSTERIOR CIRCULATION: Severe stenosis LEFT proximal V4 segment occluded distally. Distal RIGHT V4 occlusion. Occluded proximal basilar artery with immediate though thready reconstitution. Patent PICA. Poorly demonstrated remaining main branch vessels.  Tenuous, thready posterior cerebral arteries, diminutive RIGHT P com. No contrast extravasation or aneurysm. VENOUS SINUSES: Major dural venous sinuses are patent though not tailored for evaluation on this angiographic examination. ANATOMIC VARIANTS: None. DELAYED PHASE: No abnormal intracranial enhancement. MIP images reviewed. IMPRESSION: CT HEAD: 1. 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 Suspected small RIGHT cerebellar infarct. 2. Otherwise negative CT HEAD with and without contrast. CTA NECK: 1. No acute vascular process. 2. 50% stenosis LEFT ICA.  Patent vertebral arteries. CTA HEAD: 1. Limited by venous phase. 2. 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 Attenuated bilateral PCA (potentially obscured by venous structures). Recommend neuro interventional consultation. Critical Value/emergent results were called by telephone at the time of interpretation on 11/30/2017 at 10:15 pm to Dr. Kennis CarinaMICHAEL BERO , who verbally acknowledged these results. Electronically Signed   By: Awilda Metroourtnay  Bloomer M.D.   On: 11/30/2017 22:20   Dg Chest 2 View  Result Date: 11/30/2017 CLINICAL DATA:  Pt with left side, left arm pain, SOB starting today. Pt passed out during exam. Hx of CAD, DM. Smoker. EXAM: CHEST - 2 VIEW COMPARISON:  11/13/2017 FINDINGS: Cardiac silhouette is normal in size and configuration. Normal mediastinal and hilar contours. Clear lungs.  No pleural effusion or pneumothorax. Skeletal structures are intact. IMPRESSION: No active cardiopulmonary disease. Electronically Signed   By: Amie Portlandavid  Ormond M.D.   On: 11/30/2017 19:34   Ct Angio Neck W And/or Wo Contrast  Result Date: 11/30/2017 CLINICAL DATA:  Chest pain, LEFT arm paresthesias. History of diabetes, hypertension, hyperlipidemia. EXAM: CT ANGIOGRAPHY HEAD AND NECK TECHNIQUE: Multidetector CT imaging of the head and neck was performed using the standard protocol during bolus  administration of intravenous contrast. Multiplanar CT image reconstructions and MIPs were obtained to evaluate the vascular anatomy. Carotid stenosis measurements (when applicable) are obtained utilizing NASCET criteria, using the distal internal carotid diameter as the denominator. CONTRAST:  75 cc ISOVUE-370 IOPAMIDOL (ISOVUE-370) INJECTION 76% COMPARISON:  None. FINDINGS: CT HEAD FINDINGS BRAIN: No intraparenchymal hemorrhage, mass effect nor midline shift. The ventricles and sulci are normal. No acute large vascular territory infarcts. No abnormal extra-axial fluid collections. 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 Possible small RIGHT cerebellar infarct. Basal cisterns are patent. VASCULAR: Unremarkable. SKULL/SOFT TISSUES: No skull fracture. Small RIGHT parietal convexity hematoma versus scarring. ORBITS/SINUSES: The included ocular globes and orbital contents are normal.Mild lobulated ethmoid mucosal thickening. Mastoid air cells are well aerated. OTHER: None. CTA NECK FINDINGS: AORTIC ARCH: Normal appearance of the thoracic arch, normal branch pattern. The origins of the innominate, left Common carotid artery and subclavian artery are patent, mild intimal thickening calcific atherosclerosis. RIGHT CAROTID SYSTEM: Common carotid artery is patent, mild intimal thickening. Mild calcific atherosclerosis of the carotid bifurcation without hemodynamically significant stenosis by NASCET criteria. Normal appearance of the internal carotid artery. LEFT CAROTID SYSTEM: Common carotid artery is patent. Intimal thickening calcific atherosclerosis resultant 50% stenosis LEFT internal carotid artery origin by NASCET criteria. Patent LEFT internal carotid artery. VERTEBRAL ARTERIES:Codominant patent vertebral arteries  without luminal irregularity. And SKELETON: No acute osseous process though bone windows have not been submitted. Multiple absent teeth. No advanced cervical spondylosis. OTHER NECK: Soft tissues of the  neck are nonacute though, not tailored for evaluation. UPPER CHEST: Please see CT of chest from same day, reported separately. CTA HEAD FINDINGS (venous contamination): ANTERIOR CIRCULATION: Patent cervical internal carotid arteries, petrous, cavernous and supra clinoid internal carotid arteries. Patent anterior communicating artery. Patent anterior and middle cerebral arteries. No large vessel occlusion, significant stenosis, contrast extravasation or aneurysm. POSTERIOR CIRCULATION: Severe stenosis LEFT proximal V4 segment occluded distally. Distal RIGHT V4 occlusion. Occluded proximal basilar artery with immediate though thready reconstitution. Patent PICA. Poorly demonstrated remaining main branch vessels. Tenuous, thready posterior cerebral arteries, diminutive RIGHT P com. No contrast extravasation or aneurysm. VENOUS SINUSES: Major dural venous sinuses are patent though not tailored for evaluation on this angiographic examination. ANATOMIC VARIANTS: None. DELAYED PHASE: No abnormal intracranial enhancement. MIP images reviewed. IMPRESSION: CT HEAD: 1. 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 Suspected small RIGHT cerebellar infarct. 2. Otherwise negative CT HEAD with and without contrast. CTA NECK: 1. No acute vascular process. 2. 50% stenosis LEFT ICA.  Patent vertebral arteries. CTA HEAD: 1. Limited by venous phase. 2. 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 Attenuated bilateral PCA (potentially obscured by venous structures). Recommend neuro interventional consultation. Critical Value/emergent results were called by telephone at the time of interpretation on 11/30/2017 at 10:15 pm to Dr. Kennis Carina , who verbally acknowledged these results. Electronically Signed   By: Awilda Metro M.D.   On: 11/30/2017 22:20   Mr Brain Wo Contrast  Result Date: 12/01/2017 CLINICAL DATA:  47 year old male with left arm paresthesias.  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 EXAM: MRI HEAD WITHOUT CONTRAST TECHNIQUE: Multiplanar, multiecho pulse sequences of the brain and surrounding structures were obtained without intravenous contrast. COMPARISON:  CTA head and neck, head CT 11/30/2017. FINDINGS: Brain: No restricted diffusion or evidence of acute infarction. Small chronic left cerebellar infarct with hemosiderin. No other cerebellar or brainstem signal abnormality. Minimal for age nonspecific cerebral white matter T2 and FLAIR hyperintensity. No cerebral cortical encephalomalacia. No other chronic cerebral blood products. Deep gray matter nuclei are within normal limits. No midline shift, mass effect, evidence of mass lesion, ventriculomegaly, extra-axial collection or acute intracranial hemorrhage. Cervicomedullary junction within normal limits. Pituitary at the upper limits of normal. Vascular: Major intracranial vascular flow voids are preserved, although there is evidence of basilar artery atherosclerosis and stenosis (series 15, image 16). Skull and upper cervical spine: Negative visible cervical spine. Visualized bone marrow signal is within normal limits. Sinuses/Orbits: Normal orbits soft tissues. Mild ethmoid sinus mucosal thickening. Other: Mastoids are well pneumatized. Visible internal auditory structures appear normal. Scalp and face soft tissues appear negative. IMPRESSION: 1. No acute intracranial abnormality. Small chronic left cerebellar infarct. 2. Evidence of substantial basilar artery atherosclerosis, concordant with the high-grade vertebrobasilar stenosis demonstrated by CTA yesterday. Neuro-Interventional Radiology consultation is suggested to evaluate the appropriateness of potential treatment. Non-emergent evaluation can be arranged by calling [phone] during usual hours. Emergency evaluation can be requested by paging  [phone]. Electronically Signed   By: Odessa Fleming M.D.   On: 12/01/2017 09:52   Ct Angio Chest/abd/pel For Dissection W And/or Wo Contrast  Result Date: 11/30/2017 CLINICAL DATA:  Patient complains of crushing chest pain x2 hours with left arm paresthesias and pain. Patient  reports taking 2 ASA prior to arrival unable to state how much. He continues to endorse chest pain and SOB. Objectively, patient is diaphoretic and is ill appearing. Cardiac and pulmonary exam unremarkable EXAM: CT ANGIOGRAPHY CHEST, ABDOMEN AND PELVIS TECHNIQUE: Multidetector CT imaging through the chest, abdomen and pelvis was performed using the standard protocol during bolus administration of intravenous contrast. Multiplanar reconstructed images and MIPs were obtained and reviewed to evaluate the vascular anatomy. CONTRAST:  ISOVUE-370 IOPAMIDOL (ISOVUE-370) INJECTION 76%, <See Chart> ISOVUE-370 IOPAMIDOL (ISOVUE-370) INJECTION 76% COMPARISON:  Current chest radiograph. Abdomen pelvis CT, 01/25/2013. FINDINGS: CTA CHEST FINDINGS Cardiovascular: Heart normal in size and configuration. Left coronary artery calcifications. No pericardial effusion. Aorta is normal in caliber. No dissection. Minor atherosclerotic plaque noted along the distal arch and descending thoracic aorta. Mild noncalcified plaque in the left subclavian artery with no significant stenosis. Subclavian and carotid arteries are widely patent. Vertebral arteries are widely patent. Mediastinum/Nodes: No neck base or axillary masses or adenopathy. No mediastinal or hilar masses or pathologically enlarged lymph nodes. Trachea and esophagus are unremarkable. Lungs/Pleura: Minor dependent subsegmental atelectasis. Posterior upper lobe paraseptal emphysema. No lung mass or suspicious nodule. No evidence of pneumonia or pulmonary edema. No pleural effusion or pneumothorax. Musculoskeletal: No fracture or acute finding. No osteoblastic or osteolytic lesions. Review of the MIP  images confirms the above findings. CTA ABDOMEN AND PELVIS FINDINGS VASCULAR Aorta: Normal in caliber. Mild atherosclerotic plaque. No significant stenosis. No dissection. Celiac: Patent without evidence of aneurysm, dissection, vasculitis or significant stenosis. SMA: Patent without evidence of aneurysm, dissection, vasculitis or significant stenosis. Renals: Both renal arteries are patent without evidence of aneurysm, dissection, vasculitis, fibromuscular dysplasia or significant stenosis. Single right renal artery. Two left renal arteries. IMA: Patent without evidence of aneurysm, dissection, vasculitis or significant stenosis. Inflow: Patent without evidence of aneurysm, dissection, vasculitis or significant stenosis. Veins: No obvious venous abnormality within the limitations of this arterial phase study. Review of the MIP images confirms the above findings. NON-VASCULAR Hepatobiliary: No focal liver abnormality is seen. Status post cholecystectomy. No biliary dilatation. Pancreas: Unremarkable. No pancreatic ductal dilatation or surrounding inflammatory changes. Spleen: Normal in size without focal abnormality. Adrenals/Urinary Tract: No adrenal masses. Kidneys are normal in size, orientation and position. 4 mm low-density lesion anterior left kidney midpole, consistent with a cyst. No other renal masses or lesions. No stones. No hydronephrosis. Normal ureters. Bladder is unremarkable. Stomach/Bowel: Stomach is within normal limits. Appendix appears normal. No evidence of bowel wall thickening, distention, or inflammatory changes. Lymphatic: Prominent gastrohepatic ligament lymph nodes, largest measuring 12 mm in short axis. Scattered subcentimeter retroperitoneal lymph nodes. Reproductive: Unremarkable. Other: No abdominal wall hernia or abnormality. No abdominopelvic ascites. Musculoskeletal: No fracture or acute finding. No osteoblastic or osteolytic lesions. Review of the MIP images confirms the above  findings. IMPRESSION: CTA FINDINGS 1. No dissection of the thoracoabdominal aorta.  No aneurysm. 2. Minor atherosclerosis of the thoracic aorta. Mild atherosclerosis of the abdominal aorta. No significant stenosis any of the aortic branch vessels. 3. Left coronary artery calcifications. NON CTA FINDINGS 1. No acute findings within the chest, abdomen or pelvis. 2. Mild upper lobe paraseptal emphysema. Electronically Signed   By: Amie Portland M.D.   On: 11/30/2017 21:57        Scheduled Meds: .  stroke: mapping our early stages of recovery book   Does not apply Once  . aspirin  81 mg Oral Daily  . enoxaparin (LOVENOX) injection  40 mg Subcutaneous Q24H  .  ezetimibe  10 mg Oral Daily  . fludrocortisone  0.1 mg Oral Daily  . insulin aspart  0-9 Units Subcutaneous TID WC  . levothyroxine  125 mcg Oral QAC breakfast  . pantoprazole  20 mg Oral Daily  . ticagrelor  90 mg Oral BID   Continuous Infusions: . sodium chloride 100 mL/hr at 12/02/17 0151  . sodium chloride       LOS: 2 days    Time spent:35 MINS    Jackie Plum, MD [facility] Pager [phone]  If 7PM-7AM, please contact night-coverage [email or web address] Password [facility] 12/02/2017, 10:26 AM

## 2017-12-02 NOTE — Evaluation (Addendum)
Occupational Therapy Evaluation Patient Details Name: Devin [name] MRN: [id number] DOB: May 23, 1970 Today's Date: 12/02/2017    History of Present Illness Devin [name]  is a 47 y.o. male, with history of CAD status post stent placement x3, two weeks ago, came to hospital with sudden onset of left-sided chest pain radiating down the left arm.  He had dizziness and diaphoresis.  During the examination by the ED physician patient was found to have mild numbness and weakness of left side. 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     Clinical Impression   PTA patient independent and working.  Currently admitted for above and limited by L dominant UE weakness and decreased activity tolerance. Patient requires superversion for bed mobility, functional mobility (pushing IV pole), and transfers, setup assist for UB ADL and min assist for LB ADL.  Patient will benefit from continued OT services while admitted in order to maximize independence and safety with ADLs/IADLs and to provide HEP for L UE strengthening, but anticipate no further OT [name] after dc.  Will continue to follow.     Follow Up Recommendations  No OT follow up    Equipment Recommendations  None recommended by OT    Recommendations for Other Services       Precautions / Restrictions Precautions Precautions: None Restrictions Weight Bearing Restrictions: No      Mobility Bed Mobility Overal bed mobility: [name] Assistance Bed Mobility: Supine to Sit     Supine to sit: Supervision     General bed mobility comments: supervision for safety  Transfers Overall transfer level: [name] assistance Equipment used: None Transfers: Sit to/from Stand Sit to Stand: Supervision         General transfer comment: supervision for safety     Balance Overall balance assessment: No apparent balance deficits (not formally assessed)                                          ADL either performed or assessed with clinical judgement   ADL Overall ADL's : [name] assistance/impaired     Grooming: Supervision/safety;Standing   Upper Body Bathing: Set up;Sitting   Lower Body Bathing: Supervison/ safety;Sit to/from stand   Upper Body Dressing : Supervision/safety;Sitting   Lower Body Dressing: Minimal assistance;Sit to/from stand Lower Body Dressing Details (indicate cue type and reason): discomfort in R groin area Toilet Transfer: Supervision/safety;Ambulation(simulated in room)   Toileting- Clothing Manipulation and Hygiene: Supervision/safety;Sit to/from stand       Functional mobility during ADLs: Supervision/safety(pushing IV pole) General ADL Comments: Completed bed mobility, transfers, func mobility and ADLs.      Vision Baseline Vision/History: No visual deficits Patient Visual Report: No change from baseline(reports intermittent tunnel vision/blurry during "episodes") Vision Assessment?: No apparent visual deficits Additional Comments: able to read near and far, scan during mobility without difficutly     Perception     Praxis      Pertinent Vitals/Pain Pain Assessment: Faces Faces Pain Scale: Hurts a little bit Pain Location: Discomfort at groin site Pain Descriptors / Indicators: Discomfort Pain Intervention(s): Monitored during session;Repositioned     Hand Dominance Left   Extremity/Trunk Assessment Upper Extremity Assessment Upper Extremity Assessment: LUE deficits/detail LUE Deficits / Details: grossly 4/5 MMT, compared to 5/5 on R side  LUE Sensation: WNL LUE Coordination: WNL  Lower Extremity Assessment Lower Extremity Assessment: Defer to PT evaluation       Communication Communication Communication: No difficulties   Cognition Arousal/Alertness: Awake/alert Behavior During Therapy: WFL for tasks assessed/performed Overall Cognitive Status: Within Functional  Limits for tasks assessed                                     General Comments       Exercises     Shoulder Instructions      Home Living Family/patient expects to be discharged to:: Private residence Living Arrangements: Alone Available Help at Discharge: Family;Friend(s);Available PRN/intermittently Type of Home: Mobile home Home Access: Stairs to enter Entrance Stairs-Number of Steps: 5 Entrance Stairs-Rails: Right Home Layout: One level     Bathroom Shower/Tub: Chief Strategy Officer: Standard     Home Equipment: Environmental consultant - 2 wheels;Cane - single point;Crutches;Bedside commode          Prior Functioning/Environment Level of Independence: Independent                 OT Problem List: Decreased strength;Decreased activity tolerance;Decreased knowledge of use of DME or AE;Decreased knowledge of precautions      OT Treatment/Interventions: Self-care/ADL training;Therapeutic exercise;Neuromuscular education;Patient/family education;Therapeutic activities    OT Goals(Current goals can be found in the care plan section) Acute Rehab OT Goals Patient Stated Goal: Hopes to get back to work, and back to hunting and fishing OT Goal Formulation: With patient Time For Goal Achievement: 12/16/17 Potential to Achieve Goals: Good  OT Frequency: Min 2X/week   Barriers to D/C:            Co-evaluation              AM-PAC PT "6 Clicks" [name] Activity     Outcome Measure Help from another person eating meals?: None Help from another person taking care of personal grooming?: None Help from another person toileting, which includes using toliet, bedpan, or urinal?: None Help from another person bathing (including washing, rinsing, drying)?: None Help from another person to put on and taking off regular upper body clothing?: None Help from another person to put on and taking off regular lower body clothing?: A Little 6 Click Score: 23    End of Session Nurse Communication: Mobility status  Activity Tolerance: Patient tolerated treatment well Patient left: with call bell/phone within reach;Other (comment)(seated EOB )  OT Visit Diagnosis: Muscle weakness (generalized) (M62.81);Other symptoms and signs involving the nervous system (R29.898)                Time: 1610-9604 OT Time Calculation (min): 14 min Charges:  OT General Charges $OT Visit: 1 Visit OT Evaluation $OT Eval Low Complexity: 1 Low  Chancy Milroy, OT Acute Rehabilitation Services Pager [phone] Office [phone]   Chancy Milroy 12/02/2017, 3:25 PM

## 2017-12-02 NOTE — Clinical Social Work Note (Signed)
CSW consulted for no insurance for medication and test. Please consult RNCM for assistance with medications.   Clinical Social Worker will sign off for now as social work intervention is no longer needed. Please consult us again if new need arises.   MarkleevilleBridget Antonis Lor, Connecticut[place]CSWA [phone]

## 2017-12-02 NOTE — Evaluation (Signed)
Physical Therapy Evaluation Patient Details Name: Devin [name] MRN: [id number] DOB: September 22, 1970 Today's Date: 12/02/2017   History of Present Illness  Devin [name]  is a 47 y.o. male, with history of CAD status post stent placement x3, two weeks ago, came to hospital with sudden onset of left-sided chest pain radiating down the left arm.  He had dizziness and diaphoresis.  During the examination by the ED physician patient was found to have mild numbness and weakness of left side. 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    Clinical Impression   Pt admitted with above diagnosis. Pt currently with functional limitations due to the deficits listed below (see PT Problem List). Independent prior to admission; Presents with L sided weakness, mildly decr gait speed; For possible stenting of basilar arteries on 9/23;  Pt will benefit from skilled PT to increase their independence and safety with mobility to allow discharge to the venue listed below.       Follow Up Recommendations Outpatient PT    Equipment Recommendations  None recommended by PT    Recommendations for Other Services       Precautions / Restrictions Precautions Precautions: None Restrictions Weight Bearing Restrictions: No      Mobility  Bed Mobility                  Transfers Overall transfer level: Needs assistance   Transfers: Sit to/from Stand Sit to Stand: Supervision         General transfer comment: Supervision for safety  Ambulation/Gait Ambulation/Gait assistance: Min guard(without physical contact) Gait Distance (Feet): 200 Feet Assistive device: None(and occasionally pushing IV pole) Gait Pattern/deviations: Step-through pattern     General Gait Details: Decr cadence and speed; R groin discomfort effecting gait   Stairs            Wheelchair Mobility    Modified Rankin (Stroke  Patients Only) Modified Rankin (Stroke Patients Only) Pre-Morbid Rankin Score: No symptoms Modified Rankin: Slight disability     Balance Overall balance assessment: No apparent balance deficits (not formally assessed)                                           Pertinent Vitals/Pain Pain Assessment: Faces Faces Pain Scale: Hurts a little bit Pain Location: Discomfort at groin site Pain Descriptors / Indicators: Discomfort(uncomfortable with tape and gauze) Pain Intervention(s): Monitored during session    Home Living Family/patient expects to be discharged to:: Private residence Living Arrangements: Alone Available Help at Discharge: Family;Friend(s);Available PRN/intermittently Type of Home: Mobile home Home Access: Stairs to enter Entrance Stairs-Rails: Right Entrance Stairs-Number of Steps: 5 Home Layout: One level        Prior Function Level of Independence: Independent               Hand Dominance   Dominant Hand: Left    Extremity/Trunk Assessment   Upper Extremity Assessment Upper Extremity Assessment: Defer to OT evaluation    Lower Extremity Assessment Lower Extremity Assessment: LLE deficits/detail LLE Deficits / Details: Grossly 4/5 throughout in comparison to 5/5 throughout on R side       Communication   Communication: No difficulties  Cognition Arousal/Alertness: Awake/alert Behavior During Therapy: WFL for tasks assessed/performed Overall Cognitive Status: Within Functional Limits for tasks assessed  General Comments      Exercises     Assessment/Plan    PT Assessment Patient needs continued PT services  PT Problem List Decreased strength;Decreased range of motion;Decreased activity tolerance;Decreased balance;Decreased mobility;Decreased coordination;Decreased knowledge of use of DME       PT Treatment Interventions DME instruction;Gait training;Stair  training;Functional mobility training;Therapeutic activities;Therapeutic exercise;Balance training;Neuromuscular re-education;Patient/family education    PT Goals (Current goals can be found in the Care Plan section)  Acute Rehab PT Goals Patient Stated Goal: Hopes to get back to work, and back to hunting and fishing PT Goal Formulation: With patient Time For Goal Achievement: 12/09/17 Potential to Achieve Goals: Good    Frequency Min 4X/week   Barriers to discharge        Co-evaluation               AM-PAC PT "6 Clicks" Daily Activity  Outcome Measure Difficulty turning over in bed (including adjusting bedclothes, sheets and blankets)?: None Difficulty moving from lying on back to sitting on the side of the bed? : None Difficulty sitting down on and standing up from a chair with arms (e.g., wheelchair, bedside commode, etc,.)?: A Little Help needed moving to and from a bed to chair (including a wheelchair)?: None Help needed walking in hospital room?: None Help needed climbing 3-5 steps with a railing? : A Little 6 Click Score: 22    End of Session Equipment Utilized During Treatment: Gait belt Activity Tolerance: Patient tolerated treatment well Patient left: in chair;with call bell/phone within reach Nurse Communication: Mobility status PT Visit Diagnosis: Hemiplegia and hemiparesis Hemiplegia - Right/Left: Left Hemiplegia - dominant/non-dominant: Dominant Hemiplegia - caused by: Cerebral infarction;Other cerebrovascular disease    Time: 1010-1030 PT Time Calculation (min) (ACUTE ONLY): 20 min   Charges:   PT Evaluation $PT Eval Low Complexity: 1 Low          Van ClinesHolly Arisbel Maione, PT  Acute Rehabilitation Services Pager [phone] Office [phone]   Levi AlandHolly H Stephinie Battisti 12/02/2017, 11:12 AM

## 2017-12-03 ENCOUNTER — Encounter (HOSPITAL_COMMUNITY)
Admission: EM | Disposition: A | Discharge status: Home / Self Care | Payer: Self-pay | Source: Home / Self Care | Attending: Internal Medicine

## 2017-12-03 ENCOUNTER — Encounter (HOSPITAL_COMMUNITY): Payer: Self-pay | Admitting: *Deleted

## 2017-12-03 ENCOUNTER — Inpatient Hospital Stay (HOSPITAL_COMMUNITY): Payer: Self-pay | Admitting: Certified Registered"

## 2017-12-03 ENCOUNTER — Inpatient Hospital Stay (HOSPITAL_COMMUNITY): Payer: Self-pay

## 2017-12-03 DIAGNOSIS — I651 Occlusion and stenosis of basilar artery: Secondary | ICD-10-CM | POA: Diagnosis present

## 2017-12-03 DIAGNOSIS — I639 Cerebral infarction, unspecified: Secondary | ICD-10-CM

## 2017-12-03 DIAGNOSIS — I6322 Cerebral infarction due to unspecified occlusion or stenosis of basilar arteries: Secondary | ICD-10-CM

## 2017-12-03 HISTORY — PX: IR INTRA CRAN STENT: IMG2345

## 2017-12-03 HISTORY — PX: RADIOLOGY WITH ANESTHESIA: SHX6223

## 2017-12-03 LAB — GLUCOSE, CAPILLARY
GLUCOSE-CAPILLARY: 152 mg/dL — AB (ref 70–99)
GLUCOSE-CAPILLARY: 132 mg/dL — AB (ref 70–99)
Glucose-Capillary: 150 mg/dL — ABNORMAL HIGH (ref 70–99)
Glucose-Capillary: 149 mg/dL — ABNORMAL HIGH (ref 70–99)

## 2017-12-03 LAB — CBC
HEMATOCRIT: 37.1 % — AB (ref 39.0–52.0)
HEMOGLOBIN: 11.8 g/dL — AB (ref 13.0–17.0)
MCH: 29.3 pg (ref 26.0–34.0)
MCHC: 31.8 g/dL (ref 30.0–36.0)
MCV: 92.1 fL (ref 78.0–100.0)
Platelets: 250 10*3/uL (ref 150–400)
RBC: 4.03 MIL/uL — AB (ref 4.22–5.81)
RDW: 15.2 % (ref 11.5–15.5)
WBC: 8.4 10*3/uL (ref 4.0–10.5)

## 2017-12-03 LAB — MRSA PCR SCREENING: MRSA by PCR: NEGATIVE

## 2017-12-03 LAB — BASIC METABOLIC PANEL
ANION GAP: 6 (ref 5–15)
BUN: 13 mg/dL (ref 6–20)
CHLORIDE: 109 mmol/L (ref 98–111)
CO2: 24 mmol/L (ref 22–32)
Calcium: 8.7 mg/dL — ABNORMAL LOW (ref 8.9–10.3)
Creatinine, Ser: 1.02 mg/dL (ref 0.61–1.24)
GFR calc Af Amer: >60 mL/min (ref >60)
GFR calc non Af Amer: >60 mL/min (ref >60)
GLUCOSE: 153 mg/dL — AB (ref 70–99)
POTASSIUM: 3.7 mmol/L (ref 3.5–5.1)
Sodium: 139 mmol/L (ref 135–145)

## 2017-12-03 LAB — POCT ACTIVATED CLOTTING TIME
ACTIVATED CLOTTING TIME: 186 s
Activated Clotting Time: 175 seconds

## 2017-12-03 LAB — PLATELET INHIBITION P2Y12: Platelet Function  P2Y12: 45 [PRU] — ABNORMAL LOW (ref 194–418)

## 2017-12-03 SURGERY — IR WITH ANESTHESIA
Anesthesia: General | Laterality: Left

## 2017-12-03 MED ORDER — ONDANSETRON HCL 4 MG/2ML IJ SOLN
INTRAMUSCULAR | Status: AC
  Administered 2017-12-03: 4 mg via INTRAVENOUS
  Filled 2017-12-03: qty 2

## 2017-12-03 MED ORDER — FENTANYL CITRATE (PF) 100 MCG/2ML IJ SOLN
INTRAMUSCULAR | Status: DC | PRN
  Administered 2017-12-03: 50 ug via INTRAVENOUS
  Administered 2017-12-03: 100 ug via INTRAVENOUS
  Administered 2017-12-03: 50 ug via INTRAVENOUS

## 2017-12-03 MED ORDER — ROCURONIUM BROMIDE 50 MG/5ML IV SOSY
PREFILLED_SYRINGE | INTRAVENOUS | Status: DC | PRN
  Administered 2017-12-03: 50 mg via INTRAVENOUS
  Administered 2017-12-03: 20 mg via INTRAVENOUS
  Administered 2017-12-03: 50 mg via INTRAVENOUS

## 2017-12-03 MED ORDER — HYDROMORPHONE HCL 1 MG/ML IJ SOLN
0.2500 mg | INTRAMUSCULAR | Status: DC | PRN
  Administered 2017-12-03: 0.5 mg via INTRAVENOUS

## 2017-12-03 MED ORDER — CEFAZOLIN SODIUM-DEXTROSE 2-3 GM-%(50ML) IV SOLR
INTRAVENOUS | Status: DC | PRN
  Administered 2017-12-03: 2 g via INTRAVENOUS

## 2017-12-03 MED ORDER — ONDANSETRON HCL 4 MG/2ML IJ SOLN
4.0000 mg | Freq: Once | INTRAMUSCULAR | Status: AC | PRN
  Administered 2017-12-03: 4 mg via INTRAVENOUS

## 2017-12-03 MED ORDER — SODIUM CHLORIDE 0.9 % IV SOLN
INTRAVENOUS | Status: DC | PRN
  Administered 2017-12-03: 30 ug/min via INTRAVENOUS
  Administered 2017-12-03: 12:00:00 via INTRAVENOUS

## 2017-12-03 MED ORDER — SUCCINYLCHOLINE CHLORIDE 20 MG/ML IJ SOLN
INTRAMUSCULAR | Status: DC | PRN
  Administered 2017-12-03: 160 mg via INTRAVENOUS

## 2017-12-03 MED ORDER — ACETAMINOPHEN 160 MG/5ML PO SOLN: 650.0000 mg | ORAL | Status: DC | PRN

## 2017-12-03 MED ORDER — SODIUM CHLORIDE 0.9 % IV SOLN
INTRAVENOUS | Status: DC
  Administered 2017-12-03: 75 mL/h via INTRAVENOUS
  Administered 2017-12-04 (×2): via INTRAVENOUS

## 2017-12-03 MED ORDER — MIDAZOLAM HCL 5 MG/5ML IJ SOLN
INTRAMUSCULAR | Status: DC | PRN
  Administered 2017-12-03: 2 mg via INTRAVENOUS

## 2017-12-03 MED ORDER — LACTATED RINGERS IV SOLN
INTRAVENOUS | Status: DC | PRN
  Administered 2017-12-03: 10:00:00 via INTRAVENOUS

## 2017-12-03 MED ORDER — IOHEXOL 300 MG/ML  SOLN
150.0000 mL | Freq: Once | INTRAMUSCULAR | Status: AC | PRN
  Administered 2017-12-03: 80 mL via INTRA_ARTERIAL

## 2017-12-03 MED ORDER — ONDANSETRON HCL 4 MG/2ML IJ SOLN
INTRAMUSCULAR | Status: DC | PRN
  Administered 2017-12-03: 4 mg via INTRAVENOUS

## 2017-12-03 MED ORDER — SODIUM CHLORIDE 0.9 % IJ SOLN
INTRAVENOUS | Status: AC | PRN
  Administered 2017-12-03: 25 ug via INTRA_ARTERIAL

## 2017-12-03 MED ORDER — PROPOFOL 10 MG/ML IV BOLUS
INTRAVENOUS | Status: DC | PRN
  Administered 2017-12-03: 100 mg via INTRAVENOUS

## 2017-12-03 MED ORDER — MEPERIDINE HCL 50 MG/ML IJ SOLN: 6.2500 mg | INTRAMUSCULAR | Status: DC | PRN

## 2017-12-03 MED ORDER — EPTIFIBATIDE 20 MG/10ML IV SOLN
INTRAVENOUS | Status: AC
  Filled 2017-12-03: qty 10

## 2017-12-03 MED ORDER — EPTIFIBATIDE 20 MG/10ML IV SOLN
INTRAVENOUS | Status: AC | PRN
  Administered 2017-12-03 (×3): 1.5 mg via INTRAVENOUS

## 2017-12-03 MED ORDER — LIDOCAINE HCL (CARDIAC) PF 100 MG/5ML IV SOSY
PREFILLED_SYRINGE | INTRAVENOUS | Status: DC | PRN
  Administered 2017-12-03: 100 mg via INTRAVENOUS

## 2017-12-03 MED ORDER — HEPARIN (PORCINE) IN NACL 100-0.45 UNIT/ML-% IJ SOLN
INTRAMUSCULAR | Status: AC
  Administered 2017-12-03: 500 [IU]/h via INTRAVENOUS
  Filled 2017-12-03: qty 250

## 2017-12-03 MED ORDER — NITROGLYCERIN 1 MG/10 ML FOR IR/CATH LAB
INTRA_ARTERIAL | Status: AC
  Filled 2017-12-03: qty 10

## 2017-12-03 MED ORDER — HEPARIN (PORCINE) IN NACL 100-0.45 UNIT/ML-% IJ SOLN
1000.0000 [IU]/h | INTRAMUSCULAR | Status: DC
  Filled 2017-12-03: qty 250

## 2017-12-03 MED ORDER — TICAGRELOR 90 MG PO TABS
90.0000 mg | ORAL_TABLET | Freq: Two times a day (BID) | ORAL | Status: DC
  Administered 2017-12-03 – 2017-12-05 (×4): 90 mg via ORAL
  Filled 2017-12-03 (×4): qty 1

## 2017-12-03 MED ORDER — IOPAMIDOL (ISOVUE-300) INJECTION 61%
INTRAVENOUS | Status: AC
  Administered 2017-12-03: 10 mL
  Filled 2017-12-03: qty 50

## 2017-12-03 MED ORDER — ONDANSETRON HCL 4 MG/2ML IJ SOLN
4.0000 mg | Freq: Four times a day (QID) | INTRAMUSCULAR | Status: DC | PRN
  Administered 2017-12-03 – 2017-12-04 (×2): 4 mg via INTRAVENOUS
  Filled 2017-12-03 (×2): qty 2

## 2017-12-03 MED ORDER — HEPARIN (PORCINE) IN NACL 100-0.45 UNIT/ML-% IJ SOLN
500.0000 [IU]/h | INTRAMUSCULAR | Status: DC
  Administered 2017-12-03: 500 [IU]/h via INTRAVENOUS

## 2017-12-03 MED ORDER — ACETAMINOPHEN 325 MG PO TABS: 650.0000 mg | ORAL_TABLET | ORAL | Status: DC | PRN

## 2017-12-03 MED ORDER — TICAGRELOR 90 MG PO TABS: 90.0000 mg | ORAL_TABLET | Freq: Two times a day (BID) | ORAL | Status: DC

## 2017-12-03 MED ORDER — ACETAMINOPHEN 650 MG RE SUPP: 650.0000 mg | RECTAL | Status: DC | PRN

## 2017-12-03 MED ORDER — CLEVIDIPINE BUTYRATE 0.5 MG/ML IV EMUL
0.0000 mg/h | INTRAVENOUS | Status: DC
  Administered 2017-12-03: 1 mg/h via INTRAVENOUS
  Administered 2017-12-03: 2 mg/h via INTRAVENOUS
  Administered 2017-12-03 – 2017-12-04 (×2): 4 mg/h via INTRAVENOUS
  Filled 2017-12-03 (×4): qty 50

## 2017-12-03 MED ORDER — CEFAZOLIN SODIUM-DEXTROSE 2-4 GM/100ML-% IV SOLN
INTRAVENOUS | Status: AC
  Filled 2017-12-03: qty 100

## 2017-12-03 MED ORDER — HYDROMORPHONE HCL 1 MG/ML IJ SOLN
INTRAMUSCULAR | Status: AC
  Filled 2017-12-03: qty 1

## 2017-12-03 MED ORDER — ASPIRIN 81 MG PO CHEW: 81.0000 mg | CHEWABLE_TABLET | Freq: Every day | ORAL | Status: DC

## 2017-12-03 MED ORDER — PHENYLEPHRINE HCL 10 MG/ML IJ SOLN
INTRAMUSCULAR | Status: DC | PRN
  Administered 2017-12-03: 200 ug via INTRAVENOUS

## 2017-12-03 MED ORDER — HEPARIN (PORCINE) IN NACL 100-0.45 UNIT/ML-% IJ SOLN
1000.0000 [IU]/h | INTRAMUSCULAR | Status: DC
  Administered 2017-12-03 (×2): 1000 [IU]/h via INTRAVENOUS
  Filled 2017-12-03: qty 250

## 2017-12-03 MED ORDER — GLYCOPYRROLATE 0.2 MG/ML IJ SOLN
INTRAMUSCULAR | Status: DC | PRN
  Administered 2017-12-03: .2 mg via INTRAVENOUS
  Administered 2017-12-03: 0.1 mg via INTRAVENOUS

## 2017-12-03 MED ORDER — ASPIRIN 81 MG PO CHEW
81.0000 mg | CHEWABLE_TABLET | Freq: Every day | ORAL | Status: DC
  Administered 2017-12-04 – 2017-12-05 (×2): 81 mg via ORAL
  Filled 2017-12-03 (×2): qty 1

## 2017-12-03 MED ORDER — HEPARIN SODIUM (PORCINE) 1000 UNIT/ML IJ SOLN
INTRAMUSCULAR | Status: DC | PRN
  Administered 2017-12-03: 4000 [IU] via INTRAVENOUS
  Administered 2017-12-03: 1000 [IU] via INTRAVENOUS
  Administered 2017-12-03 (×2): 500 [IU] via INTRAVENOUS

## 2017-12-03 NOTE — Progress Notes (Signed)
Patient ID: Devin DailyCarl L [name], male   DOB: 05/26/1970, 47 y.o.   MRN: [id number] INR. Post procedure extubated.  Slow recovery of sensorium C/f 9/10 bifrontal H/A.No N/V Denies any visual symptoms. O/E BP 130?90s HR 80 SR PAO2  > 95 %  Pupils 3mm Rt =LT Briskly responsive..Move all 4s proportional to effort. RT groin soft .Pulses Dopplerable DPs and PTs bilaterally. CT Brain No ICH or gross hypodensities.  Plan to maintain BP 120 too 140 systolic .  Rest orders as previously.  S.Rett Stehlik

## 2017-12-03 NOTE — Progress Notes (Signed)
Devin [name] TEAM 1 - Stepdown/ICU TEAM  Devin [name]  WUJ:[id number] DOB: [birth year] DOA: 11/30/2017 PCP: Patient, No Pcp Per    Brief Narrative:  47 y.o.male w/ a history of CAD status post stent placement x3 August 2019 who presented to [facility] with sudden onset of left-sided chest pain and numbness of the left arm. A CTa of the head and neck noted an occluded basilar artery with significant vertebral artery stenosis, with negative MRI for acute CVA. Symptoms resolved in the ED, and was transferred to [facility] for further evaluation and management.   Subjective: Pt has been under the care of the Interventional Neuroradiology department all day today, and is to be transferred to the Neuro ICU under the Neurology service of overnight observation s/p basilar artery angioplasty.    Assessment & Plan:  Basilar artery stenosis  CAD  HTN  HLD  DM  Tobacco abuse  DVT prophylaxis: IV heparin  Code Status: FULL CODE Family Communication: no family present at time of exam  Disposition Plan:   Consultants:  Neurology  IR   Antimicrobials:  none  Objective: Blood pressure 122/90, pulse 61, temperature (!) 97.5 F (36.4 C), resp. rate 11, height 6\' 6"  (1.981 m), weight 125.8 kg, SpO2 100 %.  Intake/Output Summary (Last 24 hours) at 12/03/2017 1738 Last data filed at 12/03/2017 1500 Gross per 24 hour  Intake 2728.34 ml  Output 640 ml  Net 2088.34 ml   Filed Weights   12/01/17 0230  Weight: 125.8 kg    Examination: No exam per [facility] today   CBC: Recent Labs  Lab 11/30/17 1928 11/30/17 2043 12/01/17 0248 12/03/17 0610  WBC 10.7*  --  10.3 8.4  NEUTROABS 6.0  --   --   --   HGB 13.5 16.3 12.5* 11.8*  HCT 40.8 48.0 39.0 37.1*  MCV 90.9  --  92.4 92.1  PLT 296  --  284 250   Basic Metabolic Panel: Recent Labs  Lab 11/30/17 1928 11/30/17 2043 12/01/17 0248 12/03/17 0610  NA 140 140  --  139  K 3.9 3.8  --  3.7  CL 106 106  --  109  CO2 23  --   --  24   GLUCOSE 127* 117*  --  153*  BUN 22* 24*  --  13  CREATININE 1.36* 1.30* 1.18 1.02  CALCIUM 9.9  --   --  8.7*   GFR: Estimated Creatinine Clearance: 136.1 mL/min (by C-G formula based on SCr of 1.02 mg/dL).  Liver Function Tests: No results for input(s): AST, ALT, ALKPHOS, BILITOT, PROT, ALBUMIN in the last 168 hours. No results for input(s): LIPASE, AMYLASE in the last 168 hours. No results for input(s): AMMONIA in the last 168 hours.  Coagulation Profile: Recent Labs  Lab 11/30/17 1928  INR 1.02    Cardiac Enzymes: Recent Labs  Lab 12/01/17 0248 12/01/17 0742 12/01/17 1407  TROPONINI <0.03 <0.03 <0.03    HbA1C: Hgb A1c MFr Bld  Date/Time Value Ref Range Status  12/01/2017 02:48 AM 8.5 (H) 4.8 - 5.6 % Final    Comment:    (NOTE) Pre diabetes:          5.7%-6.4% Diabetes:              >6.4% Glycemic control for   <7.0% adults with diabetes   11/13/2017 05:29 PM 9.1 (H) 4.8 - 5.6 % Final    Comment:    (NOTE) Pre diabetes:  5.7%-6.4% Diabetes:              >6.4% Glycemic control for   <7.0% adults with diabetes     CBG: Recent Labs  Lab 12/02/17 1633 12/02/17 2229 12/03/17 0619 12/03/17 0805 12/03/17 1333  GLUCAP 110* 166* 150* 152* 149*     Scheduled Meds: . [MAR Hold]  stroke: mapping our early stages of recovery book   Does not apply Once  . [MAR Hold] aspirin  81 mg Oral Daily  . eptifibatide      . [MAR Hold] ezetimibe  10 mg Oral Daily  . [MAR Hold] fludrocortisone  0.1 mg Oral Daily  . HYDROmorphone      . [MAR Hold] insulin aspart  0-9 Units Subcutaneous TID WC  . [MAR Hold] levothyroxine  125 mcg Oral QAC breakfast  . nitroGLYCERIN      . ondansetron      . [MAR Hold] pantoprazole  20 mg Oral Daily  . [MAR Hold] ticagrelor  90 mg Oral BID   Continuous Infusions: . sodium chloride 100 mL/hr at 12/02/17 2133  . sodium chloride 75 mL/hr (12/03/17 1352)  . ceFAZolin    . clevidipine    . heparin    . [MAR Hold] sodium  chloride       LOS: 3 days   Lonia BloodJeffrey T. Rangel Echeverri, MD [facility] Office  [phone] Pager - Text Page per Amion  If 7PM-7AM, please contact night-coverage per Amion 12/03/2017, 5:38 PM

## 2017-12-03 NOTE — Anesthesia Procedure Notes (Signed)
Procedure Name: Intubation Date/Time: 12/03/2017 9:47 AM Performed by: Rosiland OzMeyers, Jasmyne Lodato, CRNA Pre-anesthesia Checklist: Patient identified, Emergency Drugs available, Suction available, Patient being monitored and Timeout performed Patient Re-evaluated:Patient Re-evaluated prior to induction Oxygen Delivery Method: Circle system utilized Preoxygenation: Pre-oxygenation with 100% oxygen Induction Type: IV induction Laryngoscope Size: Miller and 3 Grade View: Grade I Tube type: Oral Tube size: 7.5 mm Number of attempts: 1 Airway Equipment and Method: Stylet Placement Confirmation: ETT inserted through vocal cords under direct vision,  positive ETCO2 and breath sounds checked- equal and bilateral Secured at: 23 cm Tube secured with: Tape Dental Injury: Teeth and Oropharynx as per pre-operative assessment

## 2017-12-03 NOTE — Progress Notes (Signed)
STROKE TEAM PROGRESS NOTE   SUBJECTIVE (INTERVAL HISTORY) Patient was seen at PACU.  Complaining of pressure behind the left eye, but no headache.  Had stent placement today, complained some headache postprocedure, stat CT showed no bleeding.  Currently neuro stable, no focal deficit.  P2Y12 was 45 this morning.    OBJECTIVE Vitals:   12/03/17 1700 12/03/17 1715 12/03/17 1730 12/03/17 1745  BP: 125/82 122/90 140/82 (!) 145/77  Pulse: (!) 56 61 (!) 56 (!) 59  Resp: (!) 0 11 11 (!) 7  Temp:   (!) 97.5 F (36.4 C)   TempSrc:      SpO2: 99% 100% 100% 98%  Weight:      Height:        CBC:  Recent Labs  Lab 11/30/17 1928  12/01/17 0248 12/03/17 0610  WBC 10.7*  --  10.3 8.4  NEUTROABS 6.0  --   --   --   HGB 13.5   < > 12.5* 11.8*  HCT 40.8   < > 39.0 37.1*  MCV 90.9  --  92.4 92.1  PLT 296  --  284 250   < > = values in this interval not displayed.    Basic Metabolic Panel:  Recent Labs  Lab 11/30/17 1928 11/30/17 2043 12/01/17 0248 12/03/17 0610  NA 140 140  --  139  K 3.9 3.8  --  3.7  CL 106 106  --  109  CO2 23  --   --  24  GLUCOSE 127* 117*  --  153*  BUN 22* 24*  --  13  CREATININE 1.36* 1.30* 1.18 1.02  CALCIUM 9.9  --   --  8.7*    Lipid Panel:     Component Value Date/Time   CHOL 287 (H) 12/01/2017 0248   TRIG 300 (H) 12/01/2017 0248   HDL 22 (L) 12/01/2017 0248   CHOLHDL 13.0 12/01/2017 0248   VLDL 60 (H) 12/01/2017 0248   LDLCALC 205 (H) 12/01/2017 0248   HgbA1c:  Lab Results  Component Value Date   HGBA1C 8.5 (H) 12/01/2017   Urine Drug Screen:     Component Value Date/Time   LABOPIA NONE DETECTED 11/30/2017 2350   COCAINSCRNUR NONE DETECTED 11/30/2017 2350   LABBENZ NONE DETECTED 11/30/2017 2350   AMPHETMU NONE DETECTED 11/30/2017 2350   THCU NONE DETECTED 11/30/2017 2350   LABBARB NONE DETECTED 11/30/2017 2350    Alcohol Level     Component Value Date/Time   ETH <10 11/30/2017 1928    IMAGING  Ct Angio Head W Or Wo  Contrast Ct Angio Neck W And/or Wo Contrast 11/30/2017 IMPRESSION:  CT HEAD:  1. 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 Suspected small RIGHT cerebellar infarct.  2. Otherwise negative  CTA NECK:  1. No acute vascular process.  2. 50% stenosis LEFT ICA.  Patent vertebral arteries.  CTA HEAD:  1. Limited by venous phase.  2. 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 Attenuated bilateral PCA (potentially obscured by venous structures). Recommend neuro interventional consultation.    Mr Brain Wo Contrast 12/01/2017 IMPRESSION:  1. No acute intracranial abnormality. Small chronic left cerebellar infarct.  2. Evidence of substantial basilar artery atherosclerosis, concordant with the high-grade vertebrobasilar stenosis demonstrated by CTA yesterday.    Ct Angio Chest/abd/pel For Dissection W And/or Wo Contrast 11/30/2017 IMPRESSION:  CTA FINDINGS  1. No dissection of the thoracoabdominal aorta.  No aneurysm.  2. Minor atherosclerosis of the  thoracic aorta. Mild atherosclerosis of the abdominal aorta. No significant stenosis any of the aortic branch vessels.  3. Left coronary artery calcifications.  NON CTA FINDINGS  1. No acute findings within the chest, abdomen or pelvis.  2. Mild upper lobe paraseptal emphysema.   Cerebral Angiogram - Dr Corliss Skains 12/01/2017 Findings. 1.Occluded RT VBJ just distal to RT PICA. 2.Severe high grade stenosi of RT VA origin. 3.Severe preocclusive tandem stenosis of Lt VBJ, and of prox basilar artery. 4.40 % stenosis of LICA prox.  Transthoracic Echocardiogram - Left ventricle: The cavity size was mildly dilated. There was   mild concentric hypertrophy. Systolic function was moderately   reduced. The estimated ejection fraction was in the range of 35%   to 40%. Diffuse hypokinesis. Features are consistent with a   pseudonormal left ventricular filling pattern, with  concomitant   abnormal relaxation and increased filling pressure (grade 2   diastolic dysfunction). Doppler parameters are consistent with   high ventricular filling pressure. - Regional wall motion abnormality: Moderate hypokinesis of the mid   anterior, basal inferior, mid inferolateral, and mid   anterolateral myocardium. - Mitral valve: Mildly calcified annulus. - Atrial septum: No defect or patent foramen ovale was identified.   PHYSICAL EXAM Temp:  [97.2 F (36.2 C)-98.3 F (36.8 C)] 97.5 F (36.4 C) (09/23 1730) Pulse Rate:  [54-75] 59 (09/23 1745) Resp:  [0-23] 7 (09/23 1745) BP: (114-151)/(70-93) 145/77 (09/23 1745) SpO2:  [92 %-100 %] 98 % (09/23 1745) Arterial Line BP: (120-153)/(65-83) 151/80 (09/23 1745)  General - Well nourished, well developed, in no apparent distress.  Ophthalmologic - fundi not visualized due to noncooperation.  Cardiovascular - Regular rate and rhythm.  Mental Status -  Level of arousal and orientation to time, place, and person were intact. Language including expression, naming, repetition, comprehension was assessed and found intact. Attention span and concentration were normal. Fund of Knowledge was assessed and was intact.  Cranial Nerves II - XII - II - Visual field intact OU. III, IV, VI - Extraocular movements intact. V - Facial sensation intact bilaterally. VII - Facial movement intact bilaterally. VIII - Hearing & vestibular intact bilaterally. X - Palate elevates symmetrically. XI - Chin turning & shoulder shrug intact bilaterally. XII - Tongue protrusion intact.  Motor Strength - The patient's strength was normal in all extremities and pronator drift was absent. Right leg straight due to post procedure.  Bulk was normal and fasciculations were absent.   Motor Tone - Muscle tone was assessed at the neck and appendages and was normal.  Reflexes - The patient's reflexes were symmetrical in all extremities and he had no  pathological reflexes.  Sensory - Light touch, temperature/pinprick were assessed and were symmetrical.    Coordination - The patient had normal movements in the hands and feet with no ataxia or dysmetria.  Tremor was absent.  Gait and Station - deferred.    ASSESSMENT/PLAN Devin [name] is a 47 y.o. male with history of coronary artery disease (recent stent), EF 35 to 40% with CHF, hyperlipidemia, hypertension, hypothyroidism, tobacco abuse, diabetes mellitus, rhabdomyolysis in the past well on Crestor, dyspnea and asthma  presenting with Lt arm numbness and chest pain. He did not receive IV t-PA due to resolution of deficits.  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 Suspected small RIGHT cerebellar infarct.   MRI head - Small chronic left cerebellar infarct.  CTA H&N - Segmental occlusion basilar artery and bilateral vertebral arteries   2D Echo EF 35 to 40%  LDL - 205  HgbA1c - 8.5  VTE prophylaxis - Lovenox  Diet  - Carb modified with thin liquids  aspirin 81 mg daily and Brilinta prior to admission, now on ASA 81 mg daily and Brilinta 90 mg BID  Patient counseled to be compliant with his antithrombotic medications  Ongoing aggressive stroke risk factor management  Therapy recommendations:  Outpt PT recommended  Disposition:  Pending  VA and BA stenosis - symptomatic  DSA showed occluded RT VBJ just distal to RT PICA, severe high grade stenosi of RT VA origin, severe preocclusive tandem stenosis of Lt VBJ, and of prox basilar artery.  Post VBJ and BA stent 12/03/2017  P2Y12 was 45   New aspirin and Brilinta  On heparin IV overnight  CAD  Status post stent 2 weeks ago  EF 35 to 40%  On aspirin and Brilinta  Close cardiology follow-up  Hypertension  Stable  Florinef continued  Continue IV fluid at 75 cc/h  EP goal postprocedure 120 -  140  Hyperlipidemia  Lipid lowering medication PTA:  None - statin intolerant with rhabdo  LDL 201, goal < 70  Current lipid lowering medication: Zetia 10 mg daily  Continue Zetia at discharge  Follow with cardiology lipid clinic to consider PCSK9 inhibitors  Diabetes  HgbA1c 8.5, goal < 7.0  Uncontrolled  Hyperglycemia much improved  SSI  CBG monitoring  Patient educated on DM management  Close PCP follow-up for better DM control  Tobacco abuse  Current smoker  Smoking cessation counseling provided  Pt is willing to quit  Other Stroke Risk Factors  Obesity, Body mass index is 32.05 kg/m., recommend weight loss, diet and exercise as appropriate   Hx stroke/TIA - by imaging  Other Active Problems  Elevated creatinine 1.3 -1.02    Hospital day # 3  Marvel PlanJindong Janautica Netzley, MD PhD Stroke Neurology 12/03/2017 6:52 PM    To contact Stroke Continuity provider, please refer to [email or web address]. After hours, contact General Neurology

## 2017-12-03 NOTE — Procedures (Signed)
INR . S/P LT VBJ and prox basilar artery angioplasty with stent assistence for symptomatic severe stenosis. RT CFA approach.

## 2017-12-03 NOTE — Sedation Documentation (Signed)
7 Fr. Exoseal to right groin 

## 2017-12-03 NOTE — Progress Notes (Signed)
PT Cancellation Note  Patient Details Name: Devin [name] MRN: [id number] DOB: September 26, 1970   Cancelled Treatment:    Reason Eval/Treat Not Completed: Patient at procedure or test/unavailable attempted to see patient however he was in procedure- plan to attempt to return next day of service    Nedra HaiKristen Unger PT, DPT, CBIS  Supplemental Physical Therapist [facility]    Pager [phone] Acute Rehab Office [phone]

## 2017-12-03 NOTE — Progress Notes (Signed)
ANTICOAGULATION CONSULT NOTE - Initial Consult  Pharmacy Consult for Heparin Indication: Post-IR   Allergies  Allergen Reactions  . Bee Venom Swelling  . Crestor [Rosuvastatin] Other (See Comments)    rhabdomyolosis    Patient Measurements: Height: 6\' 6"  (198.1 cm) Weight: 277 lb 5.4 oz (125.8 kg) IBW/kg (Calculated) : 91.4 Heparin Dosing Weight: 117.8 kg  Vital Signs: Temp: 97.2 F (36.2 C) (09/23 1325) Temp Source: Oral (09/23 0756) BP: 119/76 (09/23 1425) Pulse Rate: 63 (09/23 1425)  Labs: Recent Labs    11/30/17 1928 11/30/17 2043 12/01/17 0248 12/01/17 0742 12/01/17 1407 12/03/17 0610  HGB 13.5 16.3 12.5*  --   --  11.8*  HCT 40.8 48.0 39.0  --   --  37.1*  PLT 296  --  284  --   --  250  APTT 35  --   --   --   --   --   LABPROT 13.3  --   --   --   --   --   INR 1.02  --   --   --   --   --   CREATININE 1.36* 1.30* 1.18  --   --  1.02  TROPONINI  --   --  <0.03 <0.03 <0.03  --     Estimated Creatinine Clearance: 136.1 mL/min (by C-G formula based on SCr of 1.02 mg/dL).   Medical History: Past Medical History:  Diagnosis Date  . Asthma   . Chest pain   . Coronary artery disease   . Dyspnea   . GERD (gastroesophageal reflux disease)   . History of kidney stones   . History of rhabdomyolysis   . Hyperlipidemia   . Hypertension   . Hypothyroidism   . Kidney calculus 2014  . Pericardial effusion    Small, by dobutamine echocardiogram, 04/2006  . Tobacco abuse   . Type 2 diabetes mellitus without complications (HCC) 10/07/2013  . Vitamin D deficiency     Assessment: [age] YOM who presented on 9/21 with L-sided weakness and found to have a suspected new R-CVA. Work-up also showed L-vertebrobasilar junction stenosis, now s/p angioplasty and stent placement. The patient was started on Heparin at a low rate of 500 units/hr in IR - pharmacy consulted to further assist with Heparin dosing.   Heparin Wt: 118 kg - will increase the Heparin drip rate to 1000  units/hr and follow-up with a heparin level in 6 hours.   Goal of Therapy:  Heparin level 0.1-0.25 units/ml Monitor platelets by anticoagulation protocol: Yes   Plan:  - D/c Enox for VTE prophylaxis - Increase Heparin to 1000 units/hr (10 ml/hr) - stop time at 0700 on 9/24 AM - Will continue to monitor for any signs/symptoms of bleeding and will follow up with heparin level in 6 hours   Thank you for allowing pharmacy to be a part of this patient's care.  Georgina PillionElizabeth Quetzally Callas, PharmD, BCPS Clinical Pharmacist Pager: [phone] Please check AMION for all [facility] numbers 12/03/2017 3:17 PM

## 2017-12-03 NOTE — Anesthesia Postprocedure Evaluation (Signed)
Anesthesia Post Note  Patient: Devin [name]  Procedure(s) Performed: Angioplasty with possible stenting of left VBJ (Left )     Patient location during evaluation: PACU Anesthesia Type: General Level of consciousness: awake and alert Pain management: pain level controlled Vital Signs Assessment: post-procedure vital signs reviewed and stable Respiratory status: spontaneous breathing, nonlabored ventilation, respiratory function stable and patient connected to nasal cannula oxygen Cardiovascular status: blood pressure returned to baseline and stable Postop Assessment: no apparent nausea or vomiting Anesthetic complications: no    Last Vitals:  Vitals:   12/03/17 1510 12/03/17 1525  BP: 125/75 125/81  Pulse: 64 63  Resp: (!) 0 (!) 22  Temp:    SpO2: 97% 97%    Last Pain:  Vitals:   12/03/17 1525  TempSrc:   PainSc: Asleep                 Jamina Macbeth DAVID

## 2017-12-03 NOTE — Transfer of Care (Signed)
Immediate Anesthesia Transfer of Care Note  Patient: Devin [name]  Procedure(s) Performed: Angioplasty with possible stenting of left VBJ (Left )  Patient Location: PACU  Anesthesia Type:General  Level of Consciousness: awake and patient cooperative  Airway & Oxygen Therapy: Patient Spontanous Breathing and Patient connected to face mask oxygen  Post-op Assessment: Report given to RN and Post -op Vital signs reviewed and stable  Post vital signs: Reviewed and stable  Last Vitals:  Vitals Value Taken Time  BP 130/90 12/03/2017  1:25 PM  Temp    Pulse 73 12/03/2017  1:34 PM  Resp 23 12/03/2017  1:34 PM  SpO2 97 % 12/03/2017  1:34 PM  Vitals shown include unvalidated device data.  Last Pain:  Vitals:   12/03/17 0756  TempSrc: Oral  PainSc:          Complications: No apparent anesthesia complications

## 2017-12-03 NOTE — Progress Notes (Addendum)
Saw and evaulated patient with Dr. Corliss Skainseveshwar in PACU post-procedure. Patient underwent an image-guided cerebral angiogram with stent assisted angioplasty of his left vertebrobasilar junction/proximal basilar artery stenosis this AM with Dr. Corliss Skainseveshwar.  Patient awake and alert laying in bed. Complains of mild headache, states it has improved since Dilaudid. Denies weakness, numbness/tingling, dizziness, vision changes, hearing changes, tinnitus, or speech difficulty.  Alert, awake, and oriented x3. Speech and comprehension intact. PERRL bilaterally. EOMs intact bilaterally without nystagmus or subjective diplopia. Visual fields not assessed. No facial asymmetry. Tongue midline. Can spontaneously move all extremities, mildly weaker left hand grip compared to right- this was present prior to procedure this AM. No pronator drift. Fine motor and coordination not assessed. Gait not assessed. Romberg not assessed. Heel to toe not assessed. Distal pulses 2+ bilaterally. Right groin incision soft without active bleeding or hematoma.  Plan to transfer to neuro ICU for overnight observation. Appreciate and agree with neurology management. IR to follow.  Devin Bogalexandra M Marla Pouliot, PA-C 12/03/2017, 4:02 PM

## 2017-12-03 NOTE — Anesthesia Preprocedure Evaluation (Signed)
Anesthesia Evaluation  Patient identified by MRN, date of birth, ID band Patient awake    Reviewed: Allergy & Precautions, NPO status , Patient's Chart, lab work & pertinent test results  Airway Mallampati: II  TM Distance: >3 FB Neck ROM: Full    Dental   Pulmonary shortness of breath, Current Smoker,    Pulmonary exam normal        Cardiovascular hypertension, Pt. on medications + CAD and + Cardiac Stents  Normal cardiovascular exam     Neuro/Psych CVA    GI/Hepatic GERD  Medicated and Controlled,  Endo/Other  diabetes, Type 2, Insulin Dependent  Renal/GU Renal InsufficiencyRenal disease     Musculoskeletal   Abdominal   Peds  Hematology   Anesthesia Other Findings   Reproductive/Obstetrics                             Anesthesia Physical Anesthesia Plan  ASA: III  Anesthesia Plan: General   Post-op Pain Management:    Induction: Intravenous  PONV Risk Score and Plan: 1 and Ondansetron and Treatment may vary due to age or medical condition  Airway Management Planned: Oral ETT  Additional Equipment:   Intra-op Plan:   Post-operative Plan: Extubation in OR  Informed Consent: I have reviewed the patients History and Physical, chart, labs and discussed the procedure including the risks, benefits and alternatives for the proposed anesthesia with the patient or authorized representative who has indicated his/her understanding and acceptance.     Plan Discussed with: CRNA and Surgeon  Anesthesia Plan Comments:         Anesthesia Quick Evaluation

## 2017-12-03 NOTE — Anesthesia Procedure Notes (Signed)
Arterial Line Insertion Start/End9/23/2019 9:05 AM, 12/03/2017 9:20 AM Performed by: Rosiland OzMeyers, Fantasia Jinkins, CRNA, CRNA  Preanesthetic checklist: patient identified, IV checked, site marked, risks and benefits discussed, surgical consent, monitors and equipment checked, pre-op evaluation, timeout performed and anesthesia consent Lidocaine 1% used for infiltration Right, radial was placed Catheter size: 20 G Hand hygiene performed , maximum sterile barriers used  and Seldinger technique used Allen's test indicative of satisfactory collateral circulation Attempts: 1 Procedure performed without using ultrasound guided technique. Following insertion, dressing applied and Biopatch. Post procedure assessment: normal  Patient tolerated the procedure well with no immediate complications.

## 2017-12-03 NOTE — Progress Notes (Signed)
Referring Physician(s): Arta Silence  Supervising Physician: Luanne Bras  Patient Status:  [facility] - In-pt  Chief Complaint: Left vertebrobasilar junction stenosis  Subjective:  S/P diagnostic cerebral angiogram 12/01/2017 with Dr. [name]. Patient awake and alert laying in bed. Denies stroke-like symptoms including dizziness, headache, or vision changes since diagnostic cerebral angiogram 11/30/2017. Right groin incision c/d/i.   Allergies: Bee venom and Crestor [rosuvastatin]  Medications: Prior to Admission medications   Medication Sig Start Date End Date Taking? Authorizing Provider  aspirin EC 325 MG tablet Take 650 mg by mouth See admin instructions. Took two doses today prior to admission   Yes [provider]  aspirin EC 81 MG EC tablet Take 1 tablet (81 mg total) by mouth daily. 11/17/17  Yes Patrecia Pour, MD  glimepiride (AMARYL) 2 MG tablet Take 1 tablet (2 mg total) by mouth daily with breakfast. 11/16/17  Yes Patrecia Pour, MD  levothyroxine (SYNTHROID, LEVOTHROID) 125 MCG tablet Take 1 tablet (125 mcg total) by mouth daily before breakfast. 11/16/17  Yes Patrecia Pour, MD  pantoprazole (PROTONIX) 20 MG tablet Take 1 tablet (20 mg total) by mouth daily. 11/16/17  Yes Patrecia Pour, MD  sacubitril-valsartan (ENTRESTO) 24-26 MG Take 1 tablet by mouth 2 (two) times daily. 11/26/17  Yes BranchAlphonse Guild, MD  ticagrelor (BRILINTA) 90 MG TABS tablet Take 1 tablet (90 mg total) by mouth 2 (two) times daily. 11/16/17  Yes Patrecia Pour, MD  Blood Glucose Monitoring Suppl w/Device KIT Dispense based on patient and insurance preference. Check blood sugar daily before breakfast (ICD9 250.0) 04/29/17   Pollina, Gwenyth Allegra, MD  nitroGLYCERIN (NITROSTAT) 0.4 MG SL tablet Place 0.4 mg under the tongue every 5 (five) minutes as needed for chest pain.    [provider]     Vital Signs: BP (!) 132/91 (BP Location: Left Arm)   Pulse (!) 56   Temp 98.3  F (36.8 C) (Oral)   Resp 18   Ht 6' 6"  (1.981 m)   Wt 277 lb 5.4 oz (125.8 kg)   SpO2 96%   BMI 32.05 kg/m   Physical Exam  Constitutional: He is oriented to person, place, and time. He appears well-developed and well-nourished. No distress.  Cardiovascular: Normal rate, regular rhythm and normal heart sounds.  No murmur heard. Pulmonary/Chest: Effort normal and breath sounds normal. No respiratory distress. He has no wheezes.  Neurological: He is alert and oriented to person, place, and time.  Skin: Skin is warm and dry.  Right groin incision soft without active bleeding or hematoma.  Psychiatric: He has a normal mood and affect. His behavior is normal. Judgment and thought content normal.  Nursing note and vitals reviewed.   Imaging: Ct Angio Head W Or Wo Contrast  Result Date: 11/30/2017 CLINICAL DATA:  Chest pain, LEFT arm paresthesias. History of diabetes, hypertension, hyperlipidemia. EXAM: CT ANGIOGRAPHY HEAD AND NECK TECHNIQUE: Multidetector CT imaging of the head and neck was performed using the standard protocol during bolus administration of intravenous contrast. Multiplanar CT image reconstructions and MIPs were obtained to evaluate the vascular anatomy. Carotid stenosis measurements (when applicable) are obtained utilizing NASCET criteria, using the distal internal carotid diameter as the denominator. CONTRAST:  75 cc ISOVUE-370 IOPAMIDOL (ISOVUE-370) INJECTION 76% COMPARISON:  None. FINDINGS: CT HEAD FINDINGS BRAIN: No intraparenchymal hemorrhage, mass effect nor midline shift. The ventricles and sulci are normal. No acute large vascular territory infarcts. No abnormal extra-axial fluid collections. 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 Possible small RIGHT cerebellar infarct. Basal cisterns are patent. VASCULAR: Unremarkable. SKULL/SOFT TISSUES: No skull fracture. Small RIGHT parietal convexity hematoma versus scarring. ORBITS/SINUSES: The included ocular globes and orbital  contents are normal.Mild lobulated ethmoid mucosal thickening. Mastoid air cells are well aerated. OTHER: None. CTA NECK FINDINGS: AORTIC ARCH: Normal appearance of the thoracic arch, normal branch pattern. The origins of the innominate, left Common carotid artery and subclavian artery are patent, mild intimal thickening calcific atherosclerosis. RIGHT CAROTID SYSTEM: Common carotid artery is patent, mild intimal thickening. Mild calcific atherosclerosis of the carotid bifurcation without hemodynamically significant stenosis by NASCET criteria. Normal appearance of the internal carotid artery. LEFT CAROTID SYSTEM: Common carotid artery is patent. Intimal thickening calcific atherosclerosis resultant 50% stenosis LEFT internal carotid artery origin by NASCET criteria. Patent LEFT internal carotid artery. VERTEBRAL ARTERIES:Codominant patent vertebral arteries without luminal irregularity. And SKELETON: No acute osseous process though bone windows have not been submitted. Multiple absent teeth. No advanced cervical spondylosis. OTHER NECK: Soft tissues of the neck are nonacute though, not tailored for evaluation. UPPER CHEST: Please see CT of chest from same day, reported separately. CTA HEAD FINDINGS (venous contamination): ANTERIOR CIRCULATION: Patent cervical internal carotid arteries, petrous, cavernous and supra clinoid internal carotid arteries. Patent anterior communicating artery. Patent anterior and middle cerebral arteries. No large vessel occlusion, significant stenosis, contrast extravasation or aneurysm. POSTERIOR CIRCULATION: Severe stenosis LEFT proximal V4 segment occluded distally. Distal RIGHT V4 occlusion. Occluded proximal basilar artery with immediate though thready reconstitution. Patent PICA. Poorly demonstrated remaining main branch vessels. Tenuous, thready posterior cerebral arteries, diminutive RIGHT P com. No contrast extravasation or aneurysm. VENOUS SINUSES: Major dural venous sinuses are  patent though not tailored for evaluation on this angiographic examination. ANATOMIC VARIANTS: None. DELAYED PHASE: No abnormal intracranial enhancement. MIP images reviewed. IMPRESSION: CT HEAD: 1. 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 Suspected small RIGHT cerebellar infarct. 2. Otherwise negative CT HEAD with and without contrast. CTA NECK: 1. No acute vascular process. 2. 50% stenosis LEFT ICA.  Patent vertebral arteries. CTA HEAD: 1. Limited by venous phase. 2. 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 Attenuated bilateral PCA (potentially obscured by venous structures). Recommend neuro interventional consultation. Critical Value/emergent results were called by telephone at the time of interpretation on 11/30/2017 at 10:15 pm to Dr. Gerlene Fee , who verbally acknowledged these results. Electronically Signed   By: Elon Alas M.D.   On: 11/30/2017 22:20   Dg Chest 2 View  Result Date: 11/30/2017 CLINICAL DATA:  Pt with left side, left arm pain, SOB starting today. Pt passed out during exam. Hx of CAD, DM. Smoker. EXAM: CHEST - 2 VIEW COMPARISON:  11/13/2017 FINDINGS: Cardiac silhouette is normal in size and configuration. Normal mediastinal and hilar contours. Clear lungs.  No pleural effusion or pneumothorax. Skeletal structures are intact. IMPRESSION: No active cardiopulmonary disease. Electronically Signed   By: Lajean Manes M.D.   On: 11/30/2017 19:34   Ct Angio Neck W And/or Wo Contrast  Result Date: 11/30/2017 CLINICAL DATA:  Chest pain, LEFT arm paresthesias. History of diabetes, hypertension, hyperlipidemia. EXAM: CT ANGIOGRAPHY HEAD AND NECK TECHNIQUE: Multidetector CT imaging of the head and neck was performed using the standard protocol during bolus administration of intravenous contrast. Multiplanar CT image reconstructions and MIPs were obtained to evaluate the vascular anatomy. Carotid stenosis  measurements (when applicable) are obtained utilizing NASCET criteria, using the distal internal carotid diameter as the denominator. CONTRAST:  75  cc ISOVUE-370 IOPAMIDOL (ISOVUE-370) INJECTION 76% COMPARISON:  None. FINDINGS: CT HEAD FINDINGS BRAIN: No intraparenchymal hemorrhage, mass effect nor midline shift. The ventricles and sulci are normal. No acute large vascular territory infarcts. No abnormal extra-axial fluid collections. 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 Possible small RIGHT cerebellar infarct. Basal cisterns are patent. VASCULAR: Unremarkable. SKULL/SOFT TISSUES: No skull fracture. Small RIGHT parietal convexity hematoma versus scarring. ORBITS/SINUSES: The included ocular globes and orbital contents are normal.Mild lobulated ethmoid mucosal thickening. Mastoid air cells are well aerated. OTHER: None. CTA NECK FINDINGS: AORTIC ARCH: Normal appearance of the thoracic arch, normal branch pattern. The origins of the innominate, left Common carotid artery and subclavian artery are patent, mild intimal thickening calcific atherosclerosis. RIGHT CAROTID SYSTEM: Common carotid artery is patent, mild intimal thickening. Mild calcific atherosclerosis of the carotid bifurcation without hemodynamically significant stenosis by NASCET criteria. Normal appearance of the internal carotid artery. LEFT CAROTID SYSTEM: Common carotid artery is patent. Intimal thickening calcific atherosclerosis resultant 50% stenosis LEFT internal carotid artery origin by NASCET criteria. Patent LEFT internal carotid artery. VERTEBRAL ARTERIES:Codominant patent vertebral arteries without luminal irregularity. And SKELETON: No acute osseous process though bone windows have not been submitted. Multiple absent teeth. No advanced cervical spondylosis. OTHER NECK: Soft tissues of the neck are nonacute though, not tailored for evaluation. UPPER CHEST: Please see CT of chest from same day, reported separately. CTA HEAD FINDINGS (venous  contamination): ANTERIOR CIRCULATION: Patent cervical internal carotid arteries, petrous, cavernous and supra clinoid internal carotid arteries. Patent anterior communicating artery. Patent anterior and middle cerebral arteries. No large vessel occlusion, significant stenosis, contrast extravasation or aneurysm. POSTERIOR CIRCULATION: Severe stenosis LEFT proximal V4 segment occluded distally. Distal RIGHT V4 occlusion. Occluded proximal basilar artery with immediate though thready reconstitution. Patent PICA. Poorly demonstrated remaining main branch vessels. Tenuous, thready posterior cerebral arteries, diminutive RIGHT P com. No contrast extravasation or aneurysm. VENOUS SINUSES: Major dural venous sinuses are patent though not tailored for evaluation on this angiographic examination. ANATOMIC VARIANTS: None. DELAYED PHASE: No abnormal intracranial enhancement. MIP images reviewed. IMPRESSION: CT HEAD: 1. 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 Suspected small RIGHT cerebellar infarct. 2. Otherwise negative CT HEAD with and without contrast. CTA NECK: 1. No acute vascular process. 2. 50% stenosis LEFT ICA.  Patent vertebral arteries. CTA HEAD: 1. Limited by venous phase. 2. 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 Attenuated bilateral PCA (potentially obscured by venous structures). Recommend neuro interventional consultation. Critical Value/emergent results were called by telephone at the time of interpretation on 11/30/2017 at 10:15 pm to Dr. Gerlene Fee , who verbally acknowledged these results. Electronically Signed   By: Elon Alas M.D.   On: 11/30/2017 22:20   Mr Brain Wo Contrast  Result Date: 12/01/2017 CLINICAL DATA:  47 year old male with left arm paresthesias. 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 EXAM: MRI HEAD WITHOUT  CONTRAST TECHNIQUE: Multiplanar, multiecho pulse sequences of the brain and surrounding structures were obtained without intravenous contrast. COMPARISON:  CTA head and neck, head CT 11/30/2017. FINDINGS: Brain: No restricted diffusion or evidence of acute infarction. Small chronic left cerebellar infarct with hemosiderin. No other cerebellar or brainstem signal abnormality. Minimal for age nonspecific cerebral white matter T2 and FLAIR hyperintensity. No cerebral cortical encephalomalacia. No other chronic cerebral blood products. Deep gray matter nuclei are within normal limits. No midline shift, mass effect, evidence of mass lesion, ventriculomegaly, extra-axial collection or acute intracranial  hemorrhage. Cervicomedullary junction within normal limits. Pituitary at the upper limits of normal. Vascular: Major intracranial vascular flow voids are preserved, although there is evidence of basilar artery atherosclerosis and stenosis (series 15, image 16). Skull and upper cervical spine: Negative visible cervical spine. Visualized bone marrow signal is within normal limits. Sinuses/Orbits: Normal orbits soft tissues. Mild ethmoid sinus mucosal thickening. Other: Mastoids are well pneumatized. Visible internal auditory structures appear normal. Scalp and face soft tissues appear negative. IMPRESSION: 1. No acute intracranial abnormality. Small chronic left cerebellar infarct. 2. Evidence of substantial basilar artery atherosclerosis, concordant with the high-grade vertebrobasilar stenosis demonstrated by CTA yesterday. Neuro-Interventional Radiology consultation is suggested to evaluate the appropriateness of potential treatment. Non-emergent evaluation can be arranged by calling [phone] during usual hours. Emergency evaluation can be requested by paging [phone]. Electronically Signed   By: Genevie Ann M.D.   On: 12/01/2017 09:52   Ct Angio Chest/abd/pel For Dissection W And/or Wo Contrast  Result Date:  11/30/2017 CLINICAL DATA:  Patient complains of crushing chest pain x2 hours with left arm paresthesias and pain. Patient reports taking 2 ASA prior to arrival unable to state how much. He continues to endorse chest pain and SOB. Objectively, patient is diaphoretic and is ill appearing. Cardiac and pulmonary exam unremarkable EXAM: CT ANGIOGRAPHY CHEST, ABDOMEN AND PELVIS TECHNIQUE: Multidetector CT imaging through the chest, abdomen and pelvis was performed using the standard protocol during bolus administration of intravenous contrast. Multiplanar reconstructed images and MIPs were obtained and reviewed to evaluate the vascular anatomy. CONTRAST:  133m ISOVUE-370 IOPAMIDOL (ISOVUE-370) INJECTION 76%, <See Chart> ISOVUE-370 IOPAMIDOL (ISOVUE-370) INJECTION 76% COMPARISON:  Current chest radiograph. Abdomen pelvis CT, 01/25/2013. FINDINGS: CTA CHEST FINDINGS Cardiovascular: Heart normal in size and configuration. Left coronary artery calcifications. No pericardial effusion. Aorta is normal in caliber. No dissection. Minor atherosclerotic plaque noted along the distal arch and descending thoracic aorta. Mild noncalcified plaque in the left subclavian artery with no significant stenosis. Subclavian and carotid arteries are widely patent. Vertebral arteries are widely patent. Mediastinum/Nodes: No neck base or axillary masses or adenopathy. No mediastinal or hilar masses or pathologically enlarged lymph nodes. Trachea and esophagus are unremarkable. Lungs/Pleura: Minor dependent subsegmental atelectasis. Posterior upper lobe paraseptal emphysema. No lung mass or suspicious nodule. No evidence of pneumonia or pulmonary edema. No pleural effusion or pneumothorax. Musculoskeletal: No fracture or acute finding. No osteoblastic or osteolytic lesions. Review of the MIP images confirms the above findings. CTA ABDOMEN AND PELVIS FINDINGS VASCULAR Aorta: Normal in caliber. Mild atherosclerotic plaque. No significant stenosis.  No dissection. Celiac: Patent without evidence of aneurysm, dissection, vasculitis or significant stenosis. SMA: Patent without evidence of aneurysm, dissection, vasculitis or significant stenosis. Renals: Both renal arteries are patent without evidence of aneurysm, dissection, vasculitis, fibromuscular dysplasia or significant stenosis. Single right renal artery. Two left renal arteries. IMA: Patent without evidence of aneurysm, dissection, vasculitis or significant stenosis. Inflow: Patent without evidence of aneurysm, dissection, vasculitis or significant stenosis. Veins: No obvious venous abnormality within the limitations of this arterial phase study. Review of the MIP images confirms the above findings. NON-VASCULAR Hepatobiliary: No focal liver abnormality is seen. Status post cholecystectomy. No biliary dilatation. Pancreas: Unremarkable. No pancreatic ductal dilatation or surrounding inflammatory changes. Spleen: Normal in size without focal abnormality. Adrenals/Urinary Tract: No adrenal masses. Kidneys are normal in size, orientation and position. 4 mm low-density lesion anterior left kidney midpole, consistent with a cyst. No other renal masses or lesions. No stones. No hydronephrosis. Normal ureters. Bladder is  unremarkable. Stomach/Bowel: Stomach is within normal limits. Appendix appears normal. No evidence of bowel wall thickening, distention, or inflammatory changes. Lymphatic: Prominent gastrohepatic ligament lymph nodes, largest measuring 12 mm in short axis. Scattered subcentimeter retroperitoneal lymph nodes. Reproductive: Unremarkable. Other: No abdominal wall hernia or abnormality. No abdominopelvic ascites. Musculoskeletal: No fracture or acute finding. No osteoblastic or osteolytic lesions. Review of the MIP images confirms the above findings. IMPRESSION: CTA FINDINGS 1. No dissection of the thoracoabdominal aorta.  No aneurysm. 2. Minor atherosclerosis of the thoracic aorta. Mild  atherosclerosis of the abdominal aorta. No significant stenosis any of the aortic branch vessels. 3. Left coronary artery calcifications. NON CTA FINDINGS 1. No acute findings within the chest, abdomen or pelvis. 2. Mild upper lobe paraseptal emphysema. Electronically Signed   By: Lajean Manes M.D.   On: 11/30/2017 21:57    Labs:  CBC: Recent Labs    11/16/17 0239 11/30/17 1928 11/30/17 2043 12/01/17 0248 12/03/17 0610  WBC 13.1* 10.7*  --  10.3 8.4  HGB 13.4 13.5 16.3 12.5* 11.8*  HCT 40.5 40.8 48.0 39.0 37.1*  PLT 236 296  --  284 250    COAGS: Recent Labs    11/30/17 1928  INR 1.02  APTT 35    BMP: Recent Labs    11/15/17 0415 11/16/17 0239 11/30/17 1928 11/30/17 2043 12/01/17 0248 12/03/17 0610  NA 138 135 140 140  --  139  K 4.0 3.8 3.9 3.8  --  3.7  CL 104 101 106 106  --  109  CO2 25 25 23   --   --  24  GLUCOSE 168* 176* 127* 117*  --  153*  BUN 18 13 22* 24*  --  13  CALCIUM 9.2 9.3 9.9  --   --  8.7*  CREATININE 1.41* 1.27* 1.36* 1.30* 1.18 1.02  GFRNONAA 59* >60 >60  --  >60 >60  GFRAA >60 >60 >60  --  >60 >60    LIVER FUNCTION TESTS: No results for input(s): BILITOT, AST, ALT, ALKPHOS, PROT, ALBUMIN in the last 8760 hours.  Assessment and Plan:  Left vertebrobasilar junction stenosis. Plan for image-guided cerebral angiogram with possible left vertebrobasilar junction stenosis angioplasty/stent placement today with Dr. [name]. Patient is NPO. Denies fever and WBCs WNL. INR 1.02 seconds 11/30/2017. [id number] 45 PRU this AM.  Risks and benefits of cerebral angiogram with intervention were discussed with the patient including, but not limited to bleeding, infection, vascular injury, contrast induced renal failure, stroke or even death. This interventional procedure involves the use of X-rays and because of the nature of the planned procedure, it is possible that we will have prolonged use of X-ray fluoroscopy. Potential radiation risks to you  include (but are not limited to) the following: - A slightly elevated risk for cancer  several years later in life. This risk is typically less than 0.5% percent. This risk is low in comparison to the normal incidence of human cancer, which is 33% for women and 50% for men according to the [employer]. - Radiation induced injury can include skin redness, resembling a rash, tissue breakdown / ulcers and hair loss (which can be temporary or permanent).  The likelihood of either of these occurring depends on the difficulty of the procedure and whether you are sensitive to radiation due to previous procedures, disease, or genetic conditions.  IF your procedure requires a prolonged use of radiation, you will be notified and given written instructions for further action.  It is your responsibility to monitor the irradiated area for the 2 weeks following the procedure and to notify your physician if you are concerned that you have suffered a radiation induced injury.   All of the patient's questions were answered, patient is agreeable to proceed. Consent signed and in chart.   Electronically Signed: Earley Abide, PA-C 12/03/2017, 10:01 AM   I spent a total of 15 Minutes at the the patient's bedside AND on the patient's hospital floor or unit, greater than 50% of which was counseling/coordinating care for left vertebrobasilar junction stenosis.

## 2017-12-04 ENCOUNTER — Encounter (HOSPITAL_COMMUNITY): Payer: Self-pay | Admitting: Interventional Radiology

## 2017-12-04 DIAGNOSIS — I651 Occlusion and stenosis of basilar artery: Principal | ICD-10-CM

## 2017-12-04 DIAGNOSIS — R112 Nausea with vomiting, unspecified: Secondary | ICD-10-CM

## 2017-12-04 LAB — CBC WITH DIFFERENTIAL/PLATELET
Abs Immature Granulocytes: 0 10*3/uL (ref 0.0–0.1)
BASOS ABS: 0.1 10*3/uL (ref 0.0–0.1)
Basophils Relative: 1 %
Eosinophils Absolute: 0.1 10*3/uL (ref 0.0–0.7)
Eosinophils Relative: 1 %
HCT: 37 % — ABNORMAL LOW (ref 39.0–52.0)
HEMOGLOBIN: 11.8 g/dL — AB (ref 13.0–17.0)
Immature Granulocytes: 0 %
LYMPHS PCT: 25 %
Lymphs Abs: 2.5 10*3/uL (ref 0.7–4.0)
MCH: 29.4 pg (ref 26.0–34.0)
MCHC: 31.9 g/dL (ref 30.0–36.0)
MCV: 92.3 fL (ref 78.0–100.0)
Monocytes Absolute: 0.5 10*3/uL (ref 0.1–1.0)
Monocytes Relative: 5 %
NEUTROS ABS: 6.8 10*3/uL (ref 1.7–7.7)
Neutrophils Relative %: 68 %
PLATELETS: 269 10*3/uL (ref 150–400)
RBC: 4.01 MIL/uL — AB (ref 4.22–5.81)
RDW: 15.5 % (ref 11.5–15.5)
WBC: 9.9 10*3/uL (ref 4.0–10.5)

## 2017-12-04 LAB — BASIC METABOLIC PANEL
Anion gap: 12 (ref 5–15)
BUN: 9 mg/dL (ref 6–20)
CALCIUM: 9.1 mg/dL (ref 8.9–10.3)
CHLORIDE: 102 mmol/L (ref 98–111)
CO2: 24 mmol/L (ref 22–32)
CREATININE: 0.95 mg/dL (ref 0.61–1.24)
Glucose, Bld: 147 mg/dL — ABNORMAL HIGH (ref 70–99)
Potassium: 3.7 mmol/L (ref 3.5–5.1)
SODIUM: 138 mmol/L (ref 135–145)

## 2017-12-04 LAB — GLUCOSE, CAPILLARY
GLUCOSE-CAPILLARY: 137 mg/dL — AB (ref 70–99)
GLUCOSE-CAPILLARY: 134 mg/dL — AB (ref 70–99)
Glucose-Capillary: 122 mg/dL — ABNORMAL HIGH (ref 70–99)

## 2017-12-04 LAB — HEPARIN LEVEL (UNFRACTIONATED): HEPARIN UNFRACTIONATED: 0.18 [IU]/mL — AB (ref 0.30–0.70)

## 2017-12-04 MED ORDER — METOCLOPRAMIDE HCL 5 MG/ML IJ SOLN
10.0000 mg | Freq: Three times a day (TID) | INTRAMUSCULAR | Status: AC
  Administered 2017-12-04 – 2017-12-05 (×3): 10 mg via INTRAVENOUS
  Filled 2017-12-04 (×3): qty 2

## 2017-12-04 MED ORDER — PROCHLORPERAZINE EDISYLATE 10 MG/2ML IJ SOLN: 10.0000 mg | INTRAMUSCULAR | Status: DC | PRN

## 2017-12-04 MED ORDER — HYDRALAZINE HCL 20 MG/ML IJ SOLN: 5.0000 mg | INTRAMUSCULAR | Status: DC | PRN

## 2017-12-04 NOTE — Progress Notes (Signed)
[place] TEAM 1 - Stepdown/ICU TEAM  CLELL TRAHAN  JWJ:[id number] DOB: [birth year] DOA: 11/30/2017 PCP: Patient, No Pcp Per    Brief Narrative:  47 y.o.male w/ a history of CAD status post stent placement x3 August 2019 who presented to [facility] with sudden onset of left-sided chest pain and numbness of the left arm. A CTa of the head and neck noted an occluded basilar artery with significant vertebral artery stenosis, with negative MRI for acute CVA. Symptoms resolved in the ED, and he was transferred to [facility] for further evaluation and management.   Subjective: He is feeling better in general today, but intractable nausea persists.  He has no appetite as a result.  He continues to report pain behind his L eye.  He denies cp, sob, vomiting, or HA.    Assessment & Plan:  Basilar artery stenosis w/ TIA Now s/p left VBJ and BA stent 12/03/2017 - post-procedure care per Neuro and NeuroIR - to continue ASA + Brilinta   Persisting Nausea  Relate to stenting of BA/VA - adjust meds to attempt sx control - may simply be a matter of needing more time    CAD s/p stenting 2 weeks ago  Cont ASA + Brilinta - no chest sx  Chronic Systolic CHF  EF 62-13% via TTE - no signif volume overload on exam - avoid diuretic for now w/ very poor intake   Filed Weights   12/01/17 0230  Weight: 125.8 kg    HTN BP goal postprocedure 120 - 160 per Neuro - currently stable within that range   HLD Intolerant of statins due to rhabdo - cont Zetia   DM2 - uncontrolled A1c 8.5 - CBG currently well controlled   Tobacco abuse  DVT prophylaxis: IV heparin  Code Status: FULL CODE Family Communication: spoke w/ sister at bedside   Disposition Plan: stable for transfer to neuro tele bed   Consultants:  Neurology  IR   Antimicrobials:  none  Objective: Blood pressure (!) 159/94, pulse (!) 58, temperature 98.1 F (36.7 C), temperature source Oral, resp. rate (!) 22, height 6\' 6"  (1.981 m),  weight 125.8 kg, SpO2 98 %.  Intake/Output Summary (Last 24 hours) at 12/04/2017 1600 Last data filed at 12/04/2017 1400 Gross per 24 hour  Intake 2311.77 ml  Output 3550 ml  Net -1238.23 ml   Filed Weights   12/01/17 0230  Weight: 125.8 kg    Examination: General: No acute respiratory distress Lungs: Clear to auscultation bilaterally without wheezes or crackles Cardiovascular: Regular rate and rhythm without murmur gallop or rub normal S1 and S2 Abdomen: Nontender, nondistended, soft, bowel sounds positive, no rebound, no ascites, no appreciable mass Extremities: No significant cyanosis, clubbing, or edema bilateral lower extremities   CBC: Recent Labs  Lab 11/30/17 1928  12/01/17 0248 12/03/17 0610 12/04/17 0650  WBC 10.7*  --  10.3 8.4 9.9  NEUTROABS 6.0  --   --   --  6.8  HGB 13.5   < > 12.5* 11.8* 11.8*  HCT 40.8   < > 39.0 37.1* 37.0*  MCV 90.9  --  92.4 92.1 92.3  PLT 296  --  284 250 269   < > = values in this interval not displayed.   Basic Metabolic Panel: Recent Labs  Lab 11/30/17 1928 11/30/17 2043 12/01/17 0248 12/03/17 0610 12/04/17 0650  NA 140 140  --  139 138  K 3.9 3.8  --  3.7 3.7  CL 106  106  --  109 102  CO2 23  --   --  24 24  GLUCOSE 127* 117*  --  153* 147*  BUN 22* 24*  --  13 9  CREATININE 1.36* 1.30* 1.18 1.02 0.95  CALCIUM 9.9  --   --  8.7* 9.1   GFR: Estimated Creatinine Clearance: 146.1 mL/min (by C-G formula based on SCr of 0.95 mg/dL).  Liver Function Tests: No results for input(s): AST, ALT, ALKPHOS, BILITOT, PROT, ALBUMIN in the last 168 hours. No results for input(s): LIPASE, AMYLASE in the last 168 hours. No results for input(s): AMMONIA in the last 168 hours.  Coagulation Profile: Recent Labs  Lab 11/30/17 1928  INR 1.02    Cardiac Enzymes: Recent Labs  Lab 12/01/17 0248 12/01/17 0742 12/01/17 1407  TROPONINI <0.03 <0.03 <0.03    HbA1C: Hgb A1c MFr Bld  Date/Time Value Ref Range Status  12/01/2017  02:48 AM 8.5 (H) 4.8 - 5.6 % Final    Comment:    (NOTE) Pre diabetes:          5.7%-6.4% Diabetes:              >6.4% Glycemic control for   <7.0% adults with diabetes   11/13/2017 05:29 PM 9.1 (H) 4.8 - 5.6 % Final    Comment:    (NOTE) Pre diabetes:          5.7%-6.4% Diabetes:              >6.4% Glycemic control for   <7.0% adults with diabetes     CBG: Recent Labs  Lab 12/03/17 0805 12/03/17 1333 12/03/17 1955 12/04/17 0755 12/04/17 1201  GLUCAP 152* 149* 132* 137* 122*     Scheduled Meds: .  stroke: mapping our early stages of recovery book   Does not apply Once  . aspirin  81 mg Oral Daily   Or  . aspirin  81 mg Per Tube Daily  . ezetimibe  10 mg Oral Daily  . insulin aspart  0-9 Units Subcutaneous TID WC  . levothyroxine  125 mcg Oral QAC breakfast  . metoCLOPramide (REGLAN) injection  10 mg Intravenous Q8H  . pantoprazole  20 mg Oral Daily  . ticagrelor  90 mg Oral BID   Or  . ticagrelor  90 mg Per Tube BID     LOS: 4 days   Lonia BloodJeffrey T. Dareen Gutzwiller, MD [facility] Office  [phone] Pager - Text Page per Amion  If 7PM-7AM, please contact night-coverage per Amion 12/04/2017, 4:00 PM

## 2017-12-04 NOTE — Progress Notes (Signed)
ANTICOAGULATION CONSULT NOTE - Follow Up Consult  Pharmacy Consult for heparin Indication: s/p angioplasty and stent placement  Labs: Recent Labs    12/01/17 0248 12/01/17 0742 12/01/17 1407 12/03/17 0610 12/03/17 2300  HGB 12.5*  --   --  11.8*  --   HCT 39.0  --   --  37.1*  --   PLT 284  --   --  250  --   HEPARINUNFRC  --   --   --   --  0.18*  CREATININE 1.18  --   --  1.02  --   TROPONINI <0.03 <0.03 <0.03  --   --     Assessment/Plan:  47yo male therapeutic on heparin with initial dosing s/p angioplasty and stent placement. Will continue gtt at current rate until off in am.   Vernard GamblesVeronda Reneta Niehaus, PharmD, BCPS  12/04/2017,12:26 AM

## 2017-12-04 NOTE — Progress Notes (Signed)
Physical Therapy Treatment Patient Details Name: Devin [name] MRN: [id number] DOB: [birth year] Today's Date: 12/04/2017    History of Present Illness Devin [name]  is a 47 y.o. male, with history of CAD status post stent placement x3, two weeks ago, came to hospital with sudden onset of left-sided chest pain radiating down the left arm.  He had dizziness and diaphoresis.  During the examination by the ED physician patient was found to have mild numbness and weakness of left side. 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      PT Comments    Pt presents with increased pain at R groin site post gait training. Therapeutic exercises deferred at this time.  Plan next session for continued functional mobility and standing exercises to challenge his balance.  Pt remains guarded and using IV pole, will progress away from pushing device.   Plan for Outpatient remains appropriate to address higher level balance deficits.      Follow Up Recommendations  Outpatient PT     Equipment Recommendations  None recommended by PT    Recommendations for Other Services       Precautions / Restrictions Precautions Precautions: None Restrictions Weight Bearing Restrictions: No RUE Weight Bearing: Non weight bearing    Mobility  Bed Mobility               General bed mobility comments: Pt in recliner chair on arrival.    Transfers Overall transfer level: Needs assistance Equipment used: None Transfers: Sit to/from Stand Sit to Stand: Supervision         General transfer comment: Cues for hand placement and assistance for line lead management.    Ambulation/Gait Ambulation/Gait assistance: Min guard Gait Distance (Feet): 140 Feet Assistive device: IV Pole(w/ right hand) Gait Pattern/deviations: Step-through pattern;Trunk flexed;Decreased stride length     General Gait Details: Decr cadence  and speed; R groin discomfort effecting gait    Stairs             Wheelchair Mobility    Modified Rankin (Stroke Patients Only)       Balance Overall balance assessment: No apparent balance deficits (not formally assessed)                                          Cognition Arousal/Alertness: Awake/alert Behavior During Therapy: WFL for tasks assessed/performed Overall Cognitive Status: Within Functional Limits for tasks assessed                                        Exercises      General Comments        Pertinent Vitals/Pain Pain Assessment: 0-10 Pain Score: 5  Pain Location: Discomfort at groin site, reports he feels pulling from bandage, informed nurse to assess.   Pain Descriptors / Indicators: Discomfort Pain Intervention(s): Monitored during session;Repositioned    Home Living                      Prior Function            PT Goals (current goals can now be found in the care plan section) Acute Rehab PT Goals Patient Stated Goal: Hopes to get back to work, and  back to hunting and fishing Potential to Achieve Goals: Good Progress towards PT goals: Progressing toward goals    Frequency    Min 4X/week      PT Plan Current plan remains appropriate    Co-evaluation              AM-PAC PT "6 Clicks" Daily Activity  Outcome Measure  Difficulty turning over in bed (including adjusting bedclothes, sheets and blankets)?: None Difficulty moving from lying on back to sitting on the side of the bed? : None Difficulty sitting down on and standing up from a chair with arms (e.g., wheelchair, bedside commode, etc,.)?: A Little Help needed moving to and from a bed to chair (including a wheelchair)?: None Help needed walking in hospital room?: None Help needed climbing 3-5 steps with a railing? : A Little 6 Click Score: 22    End of Session Equipment Utilized During Treatment: Gait belt Activity  Tolerance: Patient tolerated treatment well Patient left: in chair;with call bell/phone within reach Nurse Communication: Mobility status PT Visit Diagnosis: Hemiplegia and hemiparesis Hemiplegia - Right/Left: Left Hemiplegia - dominant/non-dominant: Dominant Hemiplegia - caused by: Cerebral infarction;Other cerebrovascular disease     Time: [id number] PT Time Calculation (min) (ACUTE ONLY): 14 min  Charges:  $Gait Training: 8-22 mins                     Joycelyn RuaAimee Sachi Boulay, PTA Acute Rehabilitation Services Pager [phone] Office [phone]     Shareta Fishbaugh Artis DelayJ Chiana Wamser 12/04/2017, 2:07 PM

## 2017-12-04 NOTE — Progress Notes (Signed)
STROKE TEAM PROGRESS NOTE   SUBJECTIVE (INTERVAL HISTORY) Patient sitting in chair, no family at bedside. He complains of nausea and has vomited 6 times overnight. Not able to eat due to nausea and vomiting. He still has some pressure behind the left eye, but no headache.  Neuro exam intact. Add Reglan and continue zofran.       OBJECTIVE Vitals:   12/04/17 0700 12/04/17 0800 12/04/17 0900 12/04/17 1000  BP: 124/80 (!) 134/91  140/81  Pulse: 67 63 73 74  Resp: (!) 24 18 20 15   Temp:  98.2 F (36.8 C)    TempSrc:  Oral    SpO2: 96% 99% 97% 97%  Weight:      Height:        CBC:  Recent Labs  Lab 11/30/17 1928  12/03/17 0610 12/04/17 0650  WBC 10.7*   < > 8.4 9.9  NEUTROABS 6.0  --   --  6.8  HGB 13.5   < > 11.8* 11.8*  HCT 40.8   < > 37.1* 37.0*  MCV 90.9   < > 92.1 92.3  PLT 296   < > 250 269   < > = values in this interval not displayed.    Basic Metabolic Panel:  Recent Labs  Lab 12/03/17 0610 12/04/17 0650  NA 139 138  K 3.7 3.7  CL 109 102  CO2 24 24  GLUCOSE 153* 147*  BUN 13 9  CREATININE 1.02 0.95  CALCIUM 8.7* 9.1    Lipid Panel:     Component Value Date/Time   CHOL 287 (H) 12/01/2017 0248   TRIG 300 (H) 12/01/2017 0248   HDL 22 (L) 12/01/2017 0248   CHOLHDL 13.0 12/01/2017 0248   VLDL 60 (H) 12/01/2017 0248   LDLCALC 205 (H) 12/01/2017 0248   HgbA1c:  Lab Results  Component Value Date   HGBA1C 8.5 (H) 12/01/2017   Urine Drug Screen:     Component Value Date/Time   LABOPIA NONE DETECTED 11/30/2017 2350   COCAINSCRNUR NONE DETECTED 11/30/2017 2350   LABBENZ NONE DETECTED 11/30/2017 2350   AMPHETMU NONE DETECTED 11/30/2017 2350   THCU NONE DETECTED 11/30/2017 2350   LABBARB NONE DETECTED 11/30/2017 2350    Alcohol Level     Component Value Date/Time   ETH <10 11/30/2017 1928    IMAGING  Ct Angio Head W Or [name] Contrast Ct Angio Neck W And/or [name] Contrast 11/30/2017 IMPRESSION:  CT HEAD:  1. Old appearing LEFT cerebellar  infarct. Suspected small RIGHT cerebellar infarct.  2. Otherwise negative  CTA NECK:  1. No acute vascular process.  2. 50% stenosis LEFT ICA.  Patent vertebral arteries.  CTA HEAD:  1. Limited by venous phase.  2. 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 Attenuated bilateral PCA (potentially obscured by venous structures). Recommend neuro interventional consultation.    Devin Brain [name] Contrast 12/01/2017 IMPRESSION:  1. No acute intracranial abnormality. Small chronic left cerebellar infarct.  2. Evidence of substantial basilar artery atherosclerosis, concordant with the high-grade vertebrobasilar stenosis demonstrated by CTA yesterday.    Ct Angio Chest/abd/pel For Dissection W And/or [name] Contrast 11/30/2017 IMPRESSION:  CTA FINDINGS  1. No dissection of the thoracoabdominal aorta.  No aneurysm.  2. Minor atherosclerosis of the thoracic aorta. Mild atherosclerosis of the abdominal aorta. No significant stenosis any of the aortic branch vessels.  3. Left coronary artery calcifications.  NON CTA FINDINGS  1. No acute findings within the  chest, abdomen or pelvis.  2. Mild upper lobe paraseptal emphysema.   Cerebral Angiogram - Dr Corliss Skains 12/01/2017 Findings. 1.Occluded RT VBJ just distal to RT PICA. 2.Severe high grade stenosi of RT VA origin. 3.Severe preocclusive tandem stenosis of Lt VBJ, and of prox basilar artery. 4.40 % stenosis of LICA prox.  Transthoracic Echocardiogram - Left ventricle: The cavity size was mildly dilated. There was   mild concentric hypertrophy. Systolic function was moderately   reduced. The estimated ejection fraction was in the range of 35%   to 40%. Diffuse hypokinesis. Features are consistent with a   pseudonormal left ventricular filling pattern, with concomitant   abnormal relaxation and increased filling pressure (grade 2   diastolic dysfunction). Doppler parameters  are consistent with   high ventricular filling pressure. - Regional wall motion abnormality: Moderate hypokinesis of the mid   anterior, basal inferior, mid inferolateral, and mid   anterolateral myocardium. - Mitral valve: Mildly calcified annulus. - Atrial septum: No defect or patent foramen ovale was identified.  DSA - S/P LT VBJ and prox basilar artery angioplasty with stent assistence for symptomatic severe stenosis.   PHYSICAL EXAM Temp:  [97.2 F (36.2 C)-98.3 F (36.8 C)] 98.2 F (36.8 C) (09/24 0800) Pulse Rate:  [54-75] 74 (09/24 1000) Resp:  [0-25] 15 (09/24 1000) BP: (113-145)/(69-91) 140/81 (09/24 1000) SpO2:  [92 %-100 %] 97 % (09/24 1000) Arterial Line BP: (107-153)/(57-109) 122/109 (09/24 1000)  General - Well nourished, well developed, in no apparent distress.  Ophthalmologic - fundi not visualized due to noncooperation.  Cardiovascular - Regular rate and rhythm.  Mental Status -  Level of arousal and orientation to time, place, and person were intact. Language including expression, naming, repetition, comprehension was assessed and found intact. Attention span and concentration were normal. Fund of Knowledge was assessed and was intact.  Cranial Nerves II - XII - II - Visual field intact OU. III, IV, VI - Extraocular movements intact. V - Facial sensation intact bilaterally. VII - Facial movement intact bilaterally. VIII - Hearing & vestibular intact bilaterally. X - Palate elevates symmetrically. XI - Chin turning & shoulder shrug intact bilaterally. XII - Tongue protrusion intact.  Motor Strength - The patient's strength was normal in all extremities and pronator drift was absent. Bulk was normal and fasciculations were absent.   Motor Tone - Muscle tone was assessed at the neck and appendages and was normal.  Reflexes - The patient's reflexes were symmetrical in all extremities and he had no pathological reflexes.  Sensory - Light touch,  temperature/pinprick were assessed and were symmetrical.    Coordination - The patient had normal movements in the hands and feet with no ataxia or dysmetria.  Tremor was absent.  Gait and Station - deferred.    ASSESSMENT/PLAN Devin. Devin [name] is a 47 y.o. male with history of coronary artery disease (recent stent), EF 35 to 40% with CHF, hyperlipidemia, hypertension, hypothyroidism, tobacco abuse, diabetes mellitus, rhabdomyolysis in the past well on Crestor, dyspnea and asthma  presenting with Lt arm numbness and chest pain. He did not receive IV t-PA due to resolution of deficits.  TIA likely due to large vessel stenosis of BA and VA  Resultant back to baseline  CT head - Old appearing LEFT cerebellar infarct. Suspected small RIGHT cerebellar infarct.   MRI head - Small chronic left cerebellar infarct.   CTA H&N - Segmental occlusion basilar artery and bilateral vertebral arteries   2D Echo EF 35 to 40%  LDL - 205  HgbA1c - 8.5  VTE prophylaxis - Lovenox  Diet  - Carb modified with thin liquids  aspirin 81 mg daily and Brilinta prior to admission, now on ASA 81 mg daily and Brilinta 90 mg BID  Patient counseled to be compliant with his antithrombotic medications  Ongoing aggressive stroke risk factor management  Therapy recommendations:  Outpt PT recommended  Disposition:  Pending  VA and BA stenosis - symptomatic  DSA showed occluded RT VBJ just distal to RT PICA, severe high grade stenosi of RT VA origin, severe preocclusive tandem stenosis of Lt VBJ, and of prox basilar artery.  Post left VBJ and BA stent 12/03/2017  P2Y12 was 45   Continue aspirin and Brilinta  CAD  Status post stent 2 weeks ago  EF 35 to 40%  On aspirin and Brilinta  Close cardiology follow-up  N/V and pressure behind eye  Neuro intact  Likely due to reaction to BA/VA stent  Continue zofran and add reglan  Hypertension  Stable  Off cleviprex  Continue IV fluid at 75  cc/h  BP goal postprocedure now 120 - 160  Hyperlipidemia  Lipid lowering medication PTA:  None - statin intolerant with rhabdo  LDL 201, goal < 70  Current lipid lowering medication: Zetia 10 mg daily  Continue Zetia at discharge  Follow with cardiology lipid clinic to consider PCSK9 inhibitors  Diabetes  HgbA1c 8.5, goal < 7.0  Uncontrolled  Hyperglycemia much improved  SSI  CBG monitoring  Patient educated on DM management  Close PCP follow-up for better DM control  Tobacco abuse  Current smoker  Smoking cessation counseling provided  Pt is willing to quit  Other Stroke Risk Factors  Obesity, Body mass index is 32.05 kg/m., recommend weight loss, diet and exercise as appropriate   Hx stroke/TIA - by imaging  Other Active Problems  Elevated creatinine 1.3 -1.02 - 0.95   Hospital day # 4  This patient is critically ill due to BA stenosis s/p BA stenting, CAD, DM and at significant risk of neurological worsening, death form recurrent stroke, stent restenosis, heart failure, DKA. This patient's care requires constant monitoring of vital signs, hemodynamics, respiratory and cardiac monitoring, review of multiple databases, neurological assessment, discussion with family, other specialists and medical decision making of high complexity. I spent 35 minutes of neurocritical care time in the care of this patient.   Marvel Plan, MD PhD Stroke Neurology 12/04/2017 10:41 AM    To contact Stroke Continuity provider, please refer to [email or web address]. After hours, contact General Neurology

## 2017-12-04 NOTE — Progress Notes (Signed)
Pt has been experiencing some nausea and vomiting. MD ordered zofran. Will continue to monitor

## 2017-12-04 NOTE — Progress Notes (Signed)
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 [name]. Patient awake and alert sitting in bed. Complains of nausea all night/morning, with occasional episodes of vomiting throughout the night. States that when he vomits, it "feels like my head is going to explode". States that headache and vomiting has subsided at this time, but he is still nauseous. Mild left-sided weakness that was present prior to procedure is stable. Denies dizziness, numbness/tingling, vision changes, hearing changes, tinnitus, or speech difficulty.   Allergies: Bee venom and Crestor [rosuvastatin]  Medications: Prior to Admission medications   Medication Sig Start Date End Date Taking? Authorizing Provider  aspirin EC 325 MG tablet Take 650 mg by mouth See admin instructions. Took two doses today prior to admission   Yes [provider]  aspirin EC 81 MG EC tablet Take 1 tablet (81 mg total) by mouth daily. 11/17/17  Yes Patrecia Pour, MD  glimepiride (AMARYL) 2 MG tablet Take 1 tablet (2 mg total) by mouth daily with breakfast. 11/16/17  Yes Patrecia Pour, MD  levothyroxine (SYNTHROID, LEVOTHROID) 125 MCG tablet Take 1 tablet (125 mcg total) by mouth daily before breakfast. 11/16/17  Yes Patrecia Pour, MD  pantoprazole (PROTONIX) 20 MG tablet Take 1 tablet (20 mg total) by mouth daily. 11/16/17  Yes Patrecia Pour, MD  sacubitril-valsartan (ENTRESTO) 24-26 MG Take 1 tablet by mouth 2 (two) times daily. 11/26/17  Yes BranchAlphonse Guild, MD  ticagrelor (BRILINTA) 90 MG TABS tablet Take 1 tablet (90 mg total) by mouth 2 (two) times daily. 11/16/17  Yes Patrecia Pour, MD  Blood Glucose Monitoring Suppl w/Device KIT Dispense based on patient and insurance preference.  Check blood sugar daily before breakfast (ICD9 250.0) 04/29/17   Pollina, Gwenyth Allegra, MD  nitroGLYCERIN (NITROSTAT) 0.4 MG SL tablet Place 0.4 mg under the tongue every 5 (five) minutes as needed for chest pain.    [provider]     Vital Signs: BP (!) 134/91   Pulse 63   Temp 98.2 F (36.8 C) (Oral)   Resp 18   Ht 6' 6"  (1.981 m)   Wt 277 lb 5.4 oz (125.8 kg)   SpO2 99%   BMI 32.05 kg/m   Physical Exam  Constitutional: He appears well-developed and well-nourished. No distress.  Pulmonary/Chest: Effort normal. No respiratory distress.  Neurological:  Alert, awake, and oriented x3. Speech and comprehension intact. PERRL bilaterally. EOMs intact bilaterally without nystagmus or subjective diplopia. Visual fields intact. No facial asymmetry. Tongue midline. Can spontaneously move all extremities, mildly weaker left hand grip compared to right- this was present prior to procedure, still stable at this time. No pronator drift. Fine motor and coordination intact and symmetric. Gait not assessed. Romberg not assessed. Heel to toe not assessed. Distal pulses 2+ bilaterally.  Skin: Skin is warm and dry.  Right groin incision soft without active bleeding or hematoma.  Nursing note and vitals reviewed.   Imaging: Ct Angio Head W Or Wo Contrast  Result Date: 11/30/2017 CLINICAL DATA:  Chest pain, LEFT arm paresthesias. History of diabetes, hypertension, hyperlipidemia. EXAM: CT ANGIOGRAPHY HEAD AND NECK TECHNIQUE: Multidetector CT imaging of the head and neck was performed using the standard protocol during bolus administration of intravenous contrast. Multiplanar CT image reconstructions and MIPs were obtained to  evaluate the vascular anatomy. Carotid stenosis measurements (when applicable) are obtained utilizing NASCET criteria, using the distal internal carotid diameter as the denominator. CONTRAST:  75 cc ISOVUE-370 IOPAMIDOL (ISOVUE-370) INJECTION 76% COMPARISON:   None. FINDINGS: CT HEAD FINDINGS BRAIN: No intraparenchymal hemorrhage, mass effect nor midline shift. The ventricles and sulci are normal. No acute large vascular territory infarcts. No abnormal extra-axial fluid collections. 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 Possible small RIGHT cerebellar infarct. Basal cisterns are patent. VASCULAR: Unremarkable. SKULL/SOFT TISSUES: No skull fracture. Small RIGHT parietal convexity hematoma versus scarring. ORBITS/SINUSES: The included ocular globes and orbital contents are normal.Mild lobulated ethmoid mucosal thickening. Mastoid air cells are well aerated. OTHER: None. CTA NECK FINDINGS: AORTIC ARCH: Normal appearance of the thoracic arch, normal branch pattern. The origins of the innominate, left Common carotid artery and subclavian artery are patent, mild intimal thickening calcific atherosclerosis. RIGHT CAROTID SYSTEM: Common carotid artery is patent, mild intimal thickening. Mild calcific atherosclerosis of the carotid bifurcation without hemodynamically significant stenosis by NASCET criteria. Normal appearance of the internal carotid artery. LEFT CAROTID SYSTEM: Common carotid artery is patent. Intimal thickening calcific atherosclerosis resultant 50% stenosis LEFT internal carotid artery origin by NASCET criteria. Patent LEFT internal carotid artery. VERTEBRAL ARTERIES:Codominant patent vertebral arteries without luminal irregularity. And SKELETON: No acute osseous process though bone windows have not been submitted. Multiple absent teeth. No advanced cervical spondylosis. OTHER NECK: Soft tissues of the neck are nonacute though, not tailored for evaluation. UPPER CHEST: Please see CT of chest from same day, reported separately. CTA HEAD FINDINGS (venous contamination): ANTERIOR CIRCULATION: Patent cervical internal carotid arteries, petrous, cavernous and supra clinoid internal carotid arteries. Patent anterior communicating artery. Patent anterior and middle  cerebral arteries. No large vessel occlusion, significant stenosis, contrast extravasation or aneurysm. POSTERIOR CIRCULATION: Severe stenosis LEFT proximal V4 segment occluded distally. Distal RIGHT V4 occlusion. Occluded proximal basilar artery with immediate though thready reconstitution. Patent PICA. Poorly demonstrated remaining main branch vessels. Tenuous, thready posterior cerebral arteries, diminutive RIGHT P com. No contrast extravasation or aneurysm. VENOUS SINUSES: Major dural venous sinuses are patent though not tailored for evaluation on this angiographic examination. ANATOMIC VARIANTS: None. DELAYED PHASE: No abnormal intracranial enhancement. MIP images reviewed. IMPRESSION: CT HEAD: 1. 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 Suspected small RIGHT cerebellar infarct. 2. Otherwise negative CT HEAD with and without contrast. CTA NECK: 1. No acute vascular process. 2. 50% stenosis LEFT ICA.  Patent vertebral arteries. CTA HEAD: 1. Limited by venous phase. 2. 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 Attenuated bilateral PCA (potentially obscured by venous structures). Recommend neuro interventional consultation. Critical Value/emergent results were called by telephone at the time of interpretation on 11/30/2017 at 10:15 pm to Dr. Gerlene Fee , who verbally acknowledged these results. Electronically Signed   By: Elon Alas M.D.   On: 11/30/2017 22:20   Dg Chest 2 View  Result Date: 11/30/2017 CLINICAL DATA:  Pt with left side, left arm pain, SOB starting today. Pt passed out during exam. Hx of CAD, DM. Smoker. EXAM: CHEST - 2 VIEW COMPARISON:  11/13/2017 FINDINGS: Cardiac silhouette is normal in size and configuration. Normal mediastinal and hilar contours. Clear lungs.  No pleural effusion or pneumothorax. Skeletal structures are intact. IMPRESSION: No active cardiopulmonary disease. Electronically Signed   By:  Lajean Manes M.D.   On: 11/30/2017 19:34   Ct Head Wo Contrast  Result Date: 12/03/2017 CLINICAL DATA:  47 year old male with a history vertebrobasilar atherosclerosis, status post  treatment of left vertebral and basilar disease EXAM: CT HEAD WITHOUT CONTRAST TECHNIQUE: Contiguous axial images were obtained from the base of the skull through the vertex without intravenous contrast. COMPARISON:  MR 12/01/2017, CT 11/30/2017 FINDINGS: Brain: No acute intracranial hemorrhage. No midline shift or mass effect. Gray-white differentiation maintained. Focal hypodensity in left cerebellum, unchanged from comparison studies. Vascular: New stent of the distal left vertebral artery bridging into the basilar artery. Skull: No acute fracture.  No aggressive bony lesions. Sinuses/Orbits: Partial opacification of ethmoid air cells. Other: None IMPRESSION: No acute intracranial abnormality. Changes of vertebrobasilar treatment with interval stent placement. Electronically Signed   By: Corrie Mckusick D.O.   On: 12/03/2017 13:34   Ct Angio Neck W And/or Wo Contrast  Result Date: 11/30/2017 CLINICAL DATA:  Chest pain, LEFT arm paresthesias. History of diabetes, hypertension, hyperlipidemia. EXAM: CT ANGIOGRAPHY HEAD AND NECK TECHNIQUE: Multidetector CT imaging of the head and neck was performed using the standard protocol during bolus administration of intravenous contrast. Multiplanar CT image reconstructions and MIPs were obtained to evaluate the vascular anatomy. Carotid stenosis measurements (when applicable) are obtained utilizing NASCET criteria, using the distal internal carotid diameter as the denominator. CONTRAST:  75 cc ISOVUE-370 IOPAMIDOL (ISOVUE-370) INJECTION 76% COMPARISON:  None. FINDINGS: CT HEAD FINDINGS BRAIN: No intraparenchymal hemorrhage, mass effect nor midline shift. The ventricles and sulci are normal. No acute large vascular territory infarcts. No abnormal extra-axial fluid collections. Old appearing  LEFT cerebellar infarct. Possible small RIGHT cerebellar infarct. Basal cisterns are patent. VASCULAR: Unremarkable. SKULL/SOFT TISSUES: No skull fracture. Small RIGHT parietal convexity hematoma versus scarring. ORBITS/SINUSES: The included ocular globes and orbital contents are normal.Mild lobulated ethmoid mucosal thickening. Mastoid air cells are well aerated. OTHER: None. CTA NECK FINDINGS: AORTIC ARCH: Normal appearance of the thoracic arch, normal branch pattern. The origins of the innominate, left Common carotid artery and subclavian artery are patent, mild intimal thickening calcific atherosclerosis. RIGHT CAROTID SYSTEM: Common carotid artery is patent, mild intimal thickening. Mild calcific atherosclerosis of the carotid bifurcation without hemodynamically significant stenosis by NASCET criteria. Normal appearance of the internal carotid artery. LEFT CAROTID SYSTEM: Common carotid artery is patent. Intimal thickening calcific atherosclerosis resultant 50% stenosis LEFT internal carotid artery origin by NASCET criteria. Patent LEFT internal carotid artery. VERTEBRAL ARTERIES:Codominant patent vertebral arteries without luminal irregularity. And SKELETON: No acute osseous process though bone windows have not been submitted. Multiple absent teeth. No advanced cervical spondylosis. OTHER NECK: Soft tissues of the neck are nonacute though, not tailored for evaluation. UPPER CHEST: Please see CT of chest from same day, reported separately. CTA HEAD FINDINGS (venous contamination): ANTERIOR CIRCULATION: Patent cervical internal carotid arteries, petrous, cavernous and supra clinoid internal carotid arteries. Patent anterior communicating artery. Patent anterior and middle cerebral arteries. No large vessel occlusion, significant stenosis, contrast extravasation or aneurysm. POSTERIOR CIRCULATION: Severe stenosis LEFT proximal V4 segment occluded distally. Distal RIGHT V4 occlusion. Occluded proximal basilar  artery with immediate though thready reconstitution. Patent PICA. Poorly demonstrated remaining main branch vessels. Tenuous, thready posterior cerebral arteries, diminutive RIGHT P com. No contrast extravasation or aneurysm. VENOUS SINUSES: Major dural venous sinuses are patent though not tailored for evaluation on this angiographic examination. ANATOMIC VARIANTS: None. DELAYED PHASE: No abnormal intracranial enhancement. MIP images reviewed. IMPRESSION: CT HEAD: 1. 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 Suspected small RIGHT cerebellar infarct. 2. Otherwise negative CT HEAD with and without contrast. CTA NECK: 1. No acute vascular process. 2. 50% stenosis LEFT ICA.  Patent vertebral arteries. CTA HEAD:  1. Limited by venous phase. 2. 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 Attenuated bilateral PCA (potentially obscured by venous structures). Recommend neuro interventional consultation. Critical Value/emergent results were called by telephone at the time of interpretation on 11/30/2017 at 10:15 pm to Dr. Gerlene Fee , who verbally acknowledged these results. Electronically Signed   By: Elon Alas M.D.   On: 11/30/2017 22:20   Mr Brain Wo Contrast  Result Date: 12/01/2017 CLINICAL DATA:  47 year old male with left arm paresthesias. 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 EXAM: MRI HEAD WITHOUT CONTRAST TECHNIQUE: Multiplanar, multiecho pulse sequences of the brain and surrounding structures were obtained without intravenous contrast. COMPARISON:  CTA head and neck, head CT 11/30/2017. FINDINGS: Brain: No restricted diffusion or evidence of acute infarction. Small chronic left cerebellar infarct with hemosiderin. No other cerebellar or brainstem signal abnormality. Minimal for age nonspecific cerebral white matter T2 and FLAIR hyperintensity.  No cerebral cortical encephalomalacia. No other chronic cerebral blood products. Deep gray matter nuclei are within normal limits. No midline shift, mass effect, evidence of mass lesion, ventriculomegaly, extra-axial collection or acute intracranial hemorrhage. Cervicomedullary junction within normal limits. Pituitary at the upper limits of normal. Vascular: Major intracranial vascular flow voids are preserved, although there is evidence of basilar artery atherosclerosis and stenosis (series 15, image 16). Skull and upper cervical spine: Negative visible cervical spine. Visualized bone marrow signal is within normal limits. Sinuses/Orbits: Normal orbits soft tissues. Mild ethmoid sinus mucosal thickening. Other: Mastoids are well pneumatized. Visible internal auditory structures appear normal. Scalp and face soft tissues appear negative. IMPRESSION: 1. No acute intracranial abnormality. Small chronic left cerebellar infarct. 2. Evidence of substantial basilar artery atherosclerosis, concordant with the high-grade vertebrobasilar stenosis demonstrated by CTA yesterday. Neuro-Interventional Radiology consultation is suggested to evaluate the appropriateness of potential treatment. Non-emergent evaluation can be arranged by calling [phone] during usual hours. Emergency evaluation can be requested by paging [phone]. Electronically Signed   By: Genevie Ann M.D.   On: 12/01/2017 09:52   Ct Angio Chest/abd/pel For Dissection W And/or Wo Contrast  Result Date: 11/30/2017 CLINICAL DATA:  Patient complains of crushing chest pain x2 hours with left arm paresthesias and pain. Patient reports taking 2 ASA prior to arrival unable to state how much. He continues to endorse chest pain and SOB. Objectively, patient is diaphoretic and is ill appearing. Cardiac and pulmonary exam unremarkable EXAM: CT ANGIOGRAPHY CHEST, ABDOMEN AND PELVIS TECHNIQUE: Multidetector CT imaging through the chest, abdomen and pelvis was performed  using the standard protocol during bolus administration of intravenous contrast. Multiplanar reconstructed images and MIPs were obtained and reviewed to evaluate the vascular anatomy. CONTRAST:  149m ISOVUE-370 IOPAMIDOL (ISOVUE-370) INJECTION 76%, <See Chart> ISOVUE-370 IOPAMIDOL (ISOVUE-370) INJECTION 76% COMPARISON:  Current chest radiograph. Abdomen pelvis CT, 01/25/2013. FINDINGS: CTA CHEST FINDINGS Cardiovascular: Heart normal in size and configuration. Left coronary artery calcifications. No pericardial effusion. Aorta is normal in caliber. No dissection. Minor atherosclerotic plaque noted along the distal arch and descending thoracic aorta. Mild noncalcified plaque in the left subclavian artery with no significant stenosis. Subclavian and carotid arteries are widely patent. Vertebral arteries are widely patent. Mediastinum/Nodes: No neck base or axillary masses or adenopathy. No mediastinal or hilar masses or pathologically enlarged lymph nodes. Trachea and esophagus are unremarkable. Lungs/Pleura: Minor dependent subsegmental atelectasis. Posterior upper lobe paraseptal emphysema. No lung mass or suspicious nodule. No evidence of pneumonia  or pulmonary edema. No pleural effusion or pneumothorax. Musculoskeletal: No fracture or acute finding. No osteoblastic or osteolytic lesions. Review of the MIP images confirms the above findings. CTA ABDOMEN AND PELVIS FINDINGS VASCULAR Aorta: Normal in caliber. Mild atherosclerotic plaque. No significant stenosis. No dissection. Celiac: Patent without evidence of aneurysm, dissection, vasculitis or significant stenosis. SMA: Patent without evidence of aneurysm, dissection, vasculitis or significant stenosis. Renals: Both renal arteries are patent without evidence of aneurysm, dissection, vasculitis, fibromuscular dysplasia or significant stenosis. Single right renal artery. Two left renal arteries. IMA: Patent without evidence of aneurysm, dissection, vasculitis or  significant stenosis. Inflow: Patent without evidence of aneurysm, dissection, vasculitis or significant stenosis. Veins: No obvious venous abnormality within the limitations of this arterial phase study. Review of the MIP images confirms the above findings. NON-VASCULAR Hepatobiliary: No focal liver abnormality is seen. Status post cholecystectomy. No biliary dilatation. Pancreas: Unremarkable. No pancreatic ductal dilatation or surrounding inflammatory changes. Spleen: Normal in size without focal abnormality. Adrenals/Urinary Tract: No adrenal masses. Kidneys are normal in size, orientation and position. 4 mm low-density lesion anterior left kidney midpole, consistent with a cyst. No other renal masses or lesions. No stones. No hydronephrosis. Normal ureters. Bladder is unremarkable. Stomach/Bowel: Stomach is within normal limits. Appendix appears normal. No evidence of bowel wall thickening, distention, or inflammatory changes. Lymphatic: Prominent gastrohepatic ligament lymph nodes, largest measuring 12 mm in short axis. Scattered subcentimeter retroperitoneal lymph nodes. Reproductive: Unremarkable. Other: No abdominal wall hernia or abnormality. No abdominopelvic ascites. Musculoskeletal: No fracture or acute finding. No osteoblastic or osteolytic lesions. Review of the MIP images confirms the above findings. IMPRESSION: CTA FINDINGS 1. No dissection of the thoracoabdominal aorta.  No aneurysm. 2. Minor atherosclerosis of the thoracic aorta. Mild atherosclerosis of the abdominal aorta. No significant stenosis any of the aortic branch vessels. 3. Left coronary artery calcifications. NON CTA FINDINGS 1. No acute findings within the chest, abdomen or pelvis. 2. Mild upper lobe paraseptal emphysema. Electronically Signed   By: Lajean Manes M.D.   On: 11/30/2017 21:57    Labs:  CBC: Recent Labs    11/30/17 1928 11/30/17 2043 12/01/17 0248 12/03/17 0610 12/04/17 0650  WBC 10.7*  --  10.3 8.4 9.9  HGB  13.5 16.3 12.5* 11.8* 11.8*  HCT 40.8 48.0 39.0 37.1* 37.0*  PLT 296  --  284 250 269    COAGS: Recent Labs    11/30/17 1928  INR 1.02  APTT 35    BMP: Recent Labs    11/16/17 0239 11/30/17 1928 11/30/17 2043 12/01/17 0248 12/03/17 0610 12/04/17 0650  NA 135 140 140  --  139 138  K 3.8 3.9 3.8  --  3.7 3.7  CL 101 106 106  --  109 102  CO2 25 23  --   --  24 24  GLUCOSE 176* 127* 117*  --  153* 147*  BUN 13 22* 24*  --  13 9  CALCIUM 9.3 9.9  --   --  8.7* 9.1  CREATININE 1.27* 1.36* 1.30* 1.18 1.02 0.95  GFRNONAA >60 >60  --  >60 >60 >60  GFRAA >60 >60  --  >60 >60 >60    LIVER FUNCTION TESTS: No results for input(s): BILITOT, AST, ALT, ALKPHOS, PROT, ALBUMIN in the last 8760 hours.  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 [name]. Patient's condition stable- mild left-sided weakness that was present prior to procedure is  stable. Right groin incision stable. Continue taking Brilinta 90 mg twice daily and Aspirin 81 mg once daily. Appreciate and agree with neurology management. IR to follow.   Electronically Signed: Earley Abide, PA-C 12/04/2017, 9:18 AM   I spent a total of 15 Minutes at the the patient's bedside AND on the patient's hospital floor or unit, greater than 50% of which was counseling/coordinating care for left vertebrobasilar junction/proximal basilar artery stenosis s/p revascularization.

## 2017-12-04 NOTE — Plan of Care (Signed)
Pt resp e/u, SpO2 96-100% on RA. Able to ambulate in room without difficulty, tolerates sitting in chair x 2 hours.

## 2017-12-04 NOTE — Progress Notes (Signed)
Occupational Therapy Treatment Patient Details Name: Devin [name] MRN: [id number] DOB: 10-16-1970 Today's Date: 12/04/2017    History of present illness Devin [name]  is a 47 y.o. male, with history of CAD status post stent placement x3, two weeks ago, came to hospital with sudden onset of left-sided chest pain radiating down the left arm.  He had dizziness and diaphoresis.  During the examination by the ED physician patient was found to have mild numbness and weakness of left side. 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     OT comments  Pt making good progress. Soreness in groin from procedure which causes difficulty with LB ADL. Pt with mild LUE weakness. Will follow up next visit to educate on HEP. Sister asking about use of shower chair to reduce risk of falls. Will further assess next visit as groin heals.   Follow Up Recommendations  No OT follow up    Equipment Recommendations  None recommended by OT    Recommendations for Other Services      Precautions / Restrictions Precautions Precautions: None Restrictions Weight Bearing Restrictions: No        Mobility Bed Mobility               General bed mobility comments: Pt in recliner chair on arrival.    Transfers Overall transfer level: Needs assistance Equipment used: None Transfers: Sit to/from Stand Sit to Stand: Supervision         General transfer comment: Cues for hand placement and assistance for line lead management.      Balance Overall balance assessment: No apparent balance deficits (not formally assessed)                                         ADL either performed or assessed with clinical judgement   ADL                       Lower Body Dressing: Minimal assistance;Sit to/from stand Lower Body Dressing Details (indicate cue type and reason): discomfort in R groin  area             Functional mobility during ADLs: Supervision/safety       Vision   Vision Assessment?: No apparent visual deficits   Perception     Praxis      Cognition Arousal/Alertness: Awake/alert Behavior During Therapy: WFL for tasks assessed/performed Overall Cognitive Status: Within Functional Limits for tasks assessed                                          Exercises     Shoulder Instructions       General Comments      Pertinent Vitals/ Pain       Pain Assessment: 0-10 Pain Score: 3  Pain Location: Discomfort at groin site, reports he feels pulling from bandage, informed nurse to assess.   Pain Descriptors / Indicators: Discomfort Pain Intervention(s): Limited activity within patient's tolerance  Home Living  Prior Functioning/Environment              Frequency  Min 2X/week        Progress Toward Goals  OT Goals(current goals can now be found in the care plan section)  Progress towards OT goals: Progressing toward goals  Acute Rehab OT Goals Patient Stated Goal: Hopes to get back to work, and back to hunting and fishing OT Goal Formulation: With patient Time For Goal Achievement: 12/16/17 Potential to Achieve Goals: Good ADL Goals Pt Will Perform Lower Body Dressing: with modified independence;sit to/from stand Pt Will Perform Tub/Shower Transfer: Tub transfer;with modified independence;3 in 1;ambulating Pt/caregiver will Perform Home Exercise Program: Increased strength;Left upper extremity;With written HEP provided;With theraband  Plan Discharge plan remains appropriate    Co-evaluation                 AM-PAC PT "6 Clicks" Daily Activity     Outcome Measure   Help from another person eating meals?: None Help from another person taking care of personal grooming?: None Help from another person toileting, which includes using toliet, bedpan, or  urinal?: A Little Help from another person bathing (including washing, rinsing, drying)?: A Little Help from another person to put on and taking off regular upper body clothing?: None Help from another person to put on and taking off regular lower body clothing?: A Little 6 Click Score: 21    End of Session Equipment Utilized During Treatment: Gait belt  OT Visit Diagnosis: Muscle weakness (generalized) (M62.81);Other symptoms and signs involving the nervous system (R29.898)   Activity Tolerance Patient tolerated treatment well   Patient Left in chair;with call bell/phone within reach;with family/visitor present   Nurse Communication Mobility status        Time: 1700-1718 OT Time Calculation (min): 18 min  Charges: OT General Charges $OT Visit: 1 Visit OT Treatments $Self Care/Home Management : 8-22 mins  Luisa DagoHilary Effrey Davidow, OT/L   Acute OT Clinical Specialist Acute Rehabilitation Services Pager [phone] Office [phone]    [facility]ARY 12/04/2017, 5:27 PM

## 2017-12-05 LAB — GLUCOSE, CAPILLARY
GLUCOSE-CAPILLARY: 137 mg/dL — AB (ref 70–99)
Glucose-Capillary: 120 mg/dL — ABNORMAL HIGH (ref 70–99)
Glucose-Capillary: 154 mg/dL — ABNORMAL HIGH (ref 70–99)

## 2017-12-05 MED ORDER — ASPIRIN 81 MG PO TBEC: 81.0000 mg | DELAYED_RELEASE_TABLET | Freq: Every day | ORAL | 0 refills | Status: AC

## 2017-12-05 MED ORDER — EZETIMIBE 10 MG PO TABS: 10.0000 mg | ORAL_TABLET | Freq: Every day | ORAL | 3 refills | Status: DC

## 2017-12-05 NOTE — Progress Notes (Signed)
Occupational Therapy Treatment Patient Details Name: Devin [name] MRN: [id number] DOB: 03/21/1970 Today's Date: 12/05/2017    History of present illness Devin [name]  is a 47 y.o. male, with history of CAD status post stent placement x3, two weeks ago, came to hospital with sudden onset of left-sided chest pain radiating down the left arm.  He had dizziness and diaphoresis.  During the examination by the ED physician patient was found to have mild numbness and weakness of left side. 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     OT comments  Completed all education regarding ADL and LUE HEP. Not further OT recommended at this time.   Follow Up Recommendations  No OT follow up    Equipment Recommendations  None recommended by OT    Recommendations for Other Services      Precautions / Restrictions Precautions Precautions: None       Mobility Bed Mobility Overal bed mobility: Modified Independent                Transfers Overall transfer level: Modified independent                    Balance Overall balance assessment: No apparent balance deficits (not formally assessed)                                         ADL either performed or assessed with clinical judgement   ADL Overall ADL's : Modified independent                                             Vision   Vision Assessment?: No apparent visual deficits   Perception     Praxis      Cognition Arousal/Alertness: Awake/alert Behavior During Therapy: WFL for tasks assessed/performed Overall Cognitive Status: Within Functional Limits for tasks assessed                                          Exercises Exercises: Other exercises Other Exercises Other Exercises: level 2 theraband strengthening; D1 D2 diagonals Other Exercises: Educated on using  squeeze ball Other Exercises: using nuts bolts to work on pinch strength   Shoulder Instructions       General Comments      Pertinent Vitals/ Pain       Pain Assessment: 0-10 Pain Score: 2  Pain Location: R groin Pain Descriptors / Indicators: Discomfort Pain Intervention(s): Limited activity within patient's tolerance  Home Living                                          Prior Functioning/Environment              Frequency           Progress Toward Goals  OT Goals(current goals can now be found in the care plan section)  Progress towards OT goals: Goals met/education completed, patient discharged from OT  Acute Rehab OT Goals Patient Stated Goal: Hopes to  get back to work, and back to hunting and fishing OT Goal Formulation: With patient Potential to Achieve Goals: Good ADL Goals Pt Will Perform Lower Body Dressing: with modified independence;sit to/from stand Pt Will Perform Tub/Shower Transfer: Tub transfer;with modified independence;3 in 1;ambulating Pt/caregiver will Perform Home Exercise Program: Increased strength;Left upper extremity;With written HEP provided;With theraband  Plan All goals met and education completed, patient discharged from OT services    Co-evaluation                 AM-PAC PT "6 Clicks" Daily Activity     Outcome Measure   Help from another person eating meals?: None Help from another person taking care of personal grooming?: None Help from another person toileting, which includes using toliet, bedpan, or urinal?: None Help from another person bathing (including washing, rinsing, drying)?: None Help from another person to put on and taking off regular upper body clothing?: None Help from another person to put on and taking off regular lower body clothing?: None 6 Click Score: 24    End of Session Equipment Utilized During Treatment: Gait belt  OT Visit Diagnosis: Muscle weakness (generalized)  (M62.81);Other symptoms and signs involving the nervous system (R29.898)   Activity Tolerance Patient tolerated treatment well   Patient Left in chair;with call bell/phone within reach   Nurse Communication Mobility status        Time: 0911-0930 OT Time Calculation (min): 19 min  Charges: OT General Charges $OT Visit: 1 Visit OT Treatments $Therapeutic Activity: 8-22 mins  Maurie Boettcher, OT/L   Acute OT Clinical Specialist [place] Pager [phone] Office [phone]    [facility] 12/05/2017, 11:39 AM

## 2017-12-05 NOTE — Discharge Instructions (Signed)
1)Quit Smoking !! 2)You are Taking Aspirin and Brillanta so Avoid ibuprofen/Advil/Aleve/Motrin/Goody Powders/Naproxen/BC powders/Meloxicam/Diclofenac/Indomethacin and other Nonsteroidal anti-inflammatory medications as these will make you more likely to bleed and can cause stomach ulcers, can also cause Kidney problems.  3)Follow up with Neurologist as advised

## 2017-12-05 NOTE — Progress Notes (Addendum)
STROKE TEAM PROGRESS NOTE   SUBJECTIVE (INTERVAL HISTORY) Patient is walking with PT/OT in the hallway. His nausea much improved and resolved now. His pressure behind left eye is improved but not totally gone. No HA. On ASA and brilinta.    OBJECTIVE Vitals:   12/05/17 0900 12/05/17 1055 12/05/17 1115 12/05/17 1141  BP: 127/78  (!) 147/102   Pulse: 63 68    Resp: (!) 8 10 17 19   Temp:      TempSrc:      SpO2: 94% 97%    Weight:      Height:        CBC:  Recent Labs  Lab 11/30/17 1928  12/03/17 0610 12/04/17 0650  WBC 10.7*   < > 8.4 9.9  NEUTROABS 6.0  --   --  6.8  HGB 13.5   < > 11.8* 11.8*  HCT 40.8   < > 37.1* 37.0*  MCV 90.9   < > 92.1 92.3  PLT 296   < > 250 269   < > = values in this interval not displayed.    Basic Metabolic Panel:  Recent Labs  Lab 12/03/17 0610 12/04/17 0650  NA 139 138  K 3.7 3.7  CL 109 102  CO2 24 24  GLUCOSE 153* 147*  BUN 13 9  CREATININE 1.02 0.95  CALCIUM 8.7* 9.1    Lipid Panel:     Component Value Date/Time   CHOL 287 (H) 12/01/2017 0248   TRIG 300 (H) 12/01/2017 0248   HDL 22 (L) 12/01/2017 0248   CHOLHDL 13.0 12/01/2017 0248   VLDL 60 (H) 12/01/2017 0248   LDLCALC 205 (H) 12/01/2017 0248   HgbA1c:  Lab Results  Component Value Date   HGBA1C 8.5 (H) 12/01/2017   Urine Drug Screen:     Component Value Date/Time   LABOPIA NONE DETECTED 11/30/2017 2350   COCAINSCRNUR NONE DETECTED 11/30/2017 2350   LABBENZ NONE DETECTED 11/30/2017 2350   AMPHETMU NONE DETECTED 11/30/2017 2350   THCU NONE DETECTED 11/30/2017 2350   LABBARB NONE DETECTED 11/30/2017 2350    Alcohol Level     Component Value Date/Time   ETH <10 11/30/2017 1928    IMAGING  Ct Angio Head W Or Wo Contrast Ct Angio Neck W And/or Wo Contrast 11/30/2017 IMPRESSION:  CT HEAD:  1. 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 Suspected small RIGHT cerebellar infarct.  2. Otherwise negative  CTA NECK:  1. No acute vascular process.  2. 50%  stenosis LEFT ICA.  Patent vertebral arteries.  CTA HEAD:  1. Limited by venous phase.  2. 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 Attenuated bilateral PCA (potentially obscured by venous structures). Recommend neuro interventional consultation.    Mr Brain Wo Contrast 12/01/2017 IMPRESSION:  1. No acute intracranial abnormality. Small chronic left cerebellar infarct.  2. Evidence of substantial basilar artery atherosclerosis, concordant with the high-grade vertebrobasilar stenosis demonstrated by CTA yesterday.    Ct Angio Chest/abd/pel For Dissection W And/or Wo Contrast 11/30/2017 IMPRESSION:  CTA FINDINGS  1. No dissection of the thoracoabdominal aorta.  No aneurysm.  2. Minor atherosclerosis of the thoracic aorta. Mild atherosclerosis of the abdominal aorta. No significant stenosis any of the aortic branch vessels.  3. Left coronary artery calcifications.  NON CTA FINDINGS  1. No acute findings within the chest, abdomen or pelvis.  2. Mild upper lobe paraseptal emphysema.   Cerebral Angiogram - Dr Corliss Skains 12/01/2017 Findings. 1.Occluded RT  VBJ just distal to RT PICA. 2.Severe high grade stenosi of RT VA origin. 3.Severe preocclusive tandem stenosis of Lt VBJ, and of prox basilar artery. 4.40 % stenosis of LICA prox.  Transthoracic Echocardiogram - Left ventricle: The cavity size was mildly dilated. There was   mild concentric hypertrophy. Systolic function was moderately   reduced. The estimated ejection fraction was in the range of 35%   to 40%. Diffuse hypokinesis. Features are consistent with a   pseudonormal left ventricular filling pattern, with concomitant   abnormal relaxation and increased filling pressure (grade 2   diastolic dysfunction). Doppler parameters are consistent with   high ventricular filling pressure. - Regional wall motion abnormality: Moderate hypokinesis of the mid    anterior, basal inferior, mid inferolateral, and mid   anterolateral myocardium. - Mitral valve: Mildly calcified annulus. - Atrial septum: No defect or patent foramen ovale was identified.  DSA - S/P LT VBJ and prox basilar artery angioplasty with stent assistence for symptomatic severe stenosis.   PHYSICAL EXAM Temp:  [98 F (36.7 C)-98.6 F (37 C)] 98.1 F (36.7 C) (09/25 0800) Pulse Rate:  [57-76] 68 (09/25 1055) Resp:  [0-23] 19 (09/25 1141) BP: (127-159)/(68-102) 147/102 (09/25 1115) SpO2:  [93 %-99 %] 97 % (09/25 1055)  General - Well nourished, well developed, in no apparent distress.  Ophthalmologic - fundi not visualized due to noncooperation.  Cardiovascular - Regular rate and rhythm.  Mental Status -  Level of arousal and orientation to time, place, and person were intact. Language including expression, naming, repetition, comprehension was assessed and found intact. Attention span and concentration were normal. Fund of Knowledge was assessed and was intact.  Cranial Nerves II - XII - II - Visual field intact OU. III, IV, VI - Extraocular movements intact. V - Facial sensation intact bilaterally. VII - Facial movement intact bilaterally. VIII - Hearing & vestibular intact bilaterally. X - Palate elevates symmetrically. XI - Chin turning & shoulder shrug intact bilaterally. XII - Tongue protrusion intact.  Motor Strength - The patient's strength was normal in all extremities and pronator drift was absent. Bulk was normal and fasciculations were absent.   Motor Tone - Muscle tone was assessed at the neck and appendages and was normal.  Reflexes - The patient's reflexes were symmetrical in all extremities and he had no pathological reflexes.  Sensory - Light touch, temperature/pinprick were assessed and were symmetrical.    Coordination - The patient had normal movements in the hands and feet with no ataxia or dysmetria.  Tremor was absent.  Gait and Station  - deferred.    ASSESSMENT/PLAN [name] is a 47 y.o. male with history of coronary artery disease (recent stent), EF 35 to 40% with CHF, hyperlipidemia, hypertension, hypothyroidism, tobacco abuse, diabetes mellitus, rhabdomyolysis in the past well on Crestor, dyspnea and asthma  presenting with Lt arm numbness and chest pain. He did not receive IV t-PA due to resolution of deficits.  TIA likely due to large vessel stenosis of BA and VA s/p stenting  Resultant back to baseline  CT head - Old appearing LEFT cerebellar infarct. Suspected small RIGHT cerebellar infarct.   MRI head - Small chronic left cerebellar infarct.   CTA H&N - Segmental occlusion basilar artery and bilateral vertebral arteries   2D Echo EF 35 to 40%  LDL - 205  HgbA1c - 8.5  VTE prophylaxis - Lovenox  Diet  - Carb modified with thin liquids  aspirin 81 mg daily  and Brilinta prior to admission, now on ASA 81 mg daily and Brilinta 90 mg BID. Continue DAPT due to cerebral and cardiac stents.    Patient counseled to be compliant with his antithrombotic medications  Ongoing aggressive stroke risk factor management  Therapy recommendations:  Outpt PT recommended  Disposition:  Pending  VA and BA stenosis - symptomatic  DSA showed occluded RT VBJ just distal to RT PICA, severe high grade stenosi of RT VA origin, severe preocclusive tandem stenosis of Lt VBJ, and of prox basilar artery.  Post left VBJ and BA stent 12/03/2017  P2Y12 was 45   Continue aspirin and Brilinta  CAD  Status post stent 2 weeks ago  EF 35 to 40%  On aspirin and Brilinta  Close cardiology follow-up  N/V and pressure behind eye  Neuro intact  Likely due to reaction to BA/VA stent  Continue zofran and add reglan  Hypertension  Stable  Off cleviprex  Continue IV fluid at 75 cc/h  BP goal postprocedure now 120 - 160  Hyperlipidemia  Lipid lowering medication PTA:  None - statin intolerant with  rhabdo  LDL 201, goal < 70  Current lipid lowering medication: Zetia 10 mg daily  Continue Zetia at discharge  Follow with cardiology lipid clinic to consider PCSK9 inhibitors  Diabetes  HgbA1c 8.5, goal < 7.0  Uncontrolled  Hyperglycemia much improved  SSI  CBG monitoring  Patient educated on DM management  Close PCP follow-up for better DM control  Tobacco abuse  Current smoker  Smoking cessation counseling provided  Pt is willing to quit  Other Stroke Risk Factors  Obesity, Body mass index is 32.05 kg/m., recommend weight loss, diet and exercise as appropriate   Hx stroke/TIA - by imaging  Other Active Problems  Elevated creatinine 1.3 -1.02 - 0.95   Hospital day # 5  Neurology will sign off. Please call with questions. Pt will follow up with stroke clinic NP at [facility] in about 4 weeks and follow up with Dr. Corliss Skains in 2 weeks. Thanks for the consult.   Marvel Plan, MD PhD Stroke Neurology 12/05/2017 11:47 AM    To contact Stroke Continuity provider, please refer to [email or web address]. After hours, contact General Neurology

## 2017-12-05 NOTE — Care Management Note (Signed)
Case Management Note  Patient Details  Name: Devin [name] MRN: [id number] Date of Birth: [birth year]  Subjective/Objective:    [name]  is a 47 y.o. male, with history of CAD status post stent placement x3, two weeks ago, came to hospital with sudden onset of left-sided chest pain radiating down the left arm.  He had dizziness and diaphoresis.  During the examination by the ED physician patient was found to have mild numbness and weakness of left side. 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   PTA, pt independent, lives alone.               Action/Plan: PT recommending OP rehab, but pt declines. Pt is uninsured, but states he has been receiving samples for Brilinta from his cardiologist's office.  He states that they have also assisted him with enrolling him in [place] assistance program.  He used the [facility] program with last hospital admission.  Pt has one Rx for generic Zetia; his pharmacy quotes price will be approximately $262.00.  Pt states he cannot afford this.  Spoke with attending MD; he will check on other available options and follow up with bedside nurse.    Expected Discharge Date:  12/05/17               Expected Discharge Plan:  Home/Self Care  In-House Referral:     Discharge planning Services  CM Consult, Medication Assistance  Post Acute Care Choice:    Choice offered to:     DME Arranged:    DME Agency:     HH Arranged:    HH Agency:     Status of Service:  Completed, signed off  If discussed at [employer] of Stay Meetings, dates discussed:    Additional Comments:  Quintella Baton, RN, BSN  Trauma/Neuro ICU Case Manager [phone]

## 2017-12-05 NOTE — Progress Notes (Addendum)
Physical Therapy Treatment Patient Details Name: Devin [name] MRN: [id number] DOB: 1971/01/10 Today's Date: 12/05/2017    History of Present Illness Devin [name]  is a 47 y.o. male, with history of CAD status post stent placement x3, two weeks ago, came to hospital with sudden onset of left-sided chest pain radiating down the left arm.  He had dizziness and diaphoresis.  During the examination by the ED physician patient was found to have mild numbness and weakness of left side. 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      PT Comments    Pt presenting with improved balance and function from previous session no pain noted during tx.  Pt performed gait and stair training with functional independence.  Will defer need for OP services at this time. He does continue to complain of minor tingling in his hand.   Educated patient on continuing walking program at home to improve endurance.  Will inform supervising PT of progress and change in recommendations at this time.      Follow Up Recommendations  No PT follow up     Equipment Recommendations  None recommended by PT    Recommendations for Other Services       Precautions / Restrictions Precautions Precautions: None Restrictions Weight Bearing Restrictions: No RUE Weight Bearing: Non weight bearing    Mobility  Bed Mobility Overal bed mobility: Modified Independent             General bed mobility comments: Pt in recliner chair on arrival.    Transfers Overall transfer level: Modified independent Equipment used: None Transfers: Sit to/from Stand Sit to Stand: Modified independent (Device/Increase time)            Ambulation/Gait Ambulation/Gait assistance: Independent Gait Distance (Feet): 120 Feet Assistive device: None Gait Pattern/deviations: Step-through pattern;WFL(Within Functional Limits)     General Gait  Details: No complaints of pain.  Improved gait and reciprocal armswing.  No device used for gait training.     Stairs Stairs: Yes Stairs assistance: Modified independent (Device/Increase time) Stair Management: One rail Left;Alternating pattern Number of Stairs: 12 General stair comments: No assistance needed performed well.     Wheelchair Mobility    Modified Rankin (Stroke Patients Only) Modified Rankin (Stroke Patients Only) Pre-Morbid Rankin Score: No symptoms Modified Rankin: No significant disability     Balance Overall balance assessment: No apparent balance deficits (not formally assessed)                                          Cognition Arousal/Alertness: Awake/alert Behavior During Therapy: WFL for tasks assessed/performed Overall Cognitive Status: Within Functional Limits for tasks assessed                                        Exercises Other Exercises Other Exercises: level 2 theraband strengthening; D1 D2 diagonals Other Exercises: Educated on using squeeze ball Other Exercises: using nuts bolts to work on pinch strength    General Comments        Pertinent Vitals/Pain Pain Assessment: 0-10 Pain Score: 2  Pain Location: R groin Pain Descriptors / Indicators: Discomfort Pain Intervention(s): Monitored during session;Repositioned    Home Living  Prior Function            PT Goals (current goals can now be found in the care plan section) Acute Rehab PT Goals Patient Stated Goal: Hopes to get back to work, and back to hunting and fishing Potential to Achieve Goals: Good Progress towards PT goals: (Goals met patient to d/c home today.  )    Frequency    Min 4X/week      PT Plan Discharge plan needs to be updated    Co-evaluation              AM-PAC PT "6 Clicks" Daily Activity  Outcome Measure  Difficulty turning over in bed (including adjusting bedclothes,  sheets and blankets)?: None Difficulty moving from lying on back to sitting on the side of the bed? : None Difficulty sitting down on and standing up from a chair with arms (e.g., wheelchair, bedside commode, etc,.)?: None Help needed moving to and from a bed to chair (including a wheelchair)?: None Help needed walking in hospital room?: None Help needed climbing 3-5 steps with a railing? : None 6 Click Score: 24    End of Session   Activity Tolerance: Patient tolerated treatment well Patient left: in chair;with call bell/phone within reach Nurse Communication: Mobility status PT Visit Diagnosis: Hemiplegia and hemiparesis Hemiplegia - Right/Left: Left Hemiplegia - dominant/non-dominant: Dominant Hemiplegia - caused by: Cerebral infarction;Other cerebrovascular disease     Time: 1228-1237 PT Time Calculation (min) (ACUTE ONLY): 9 min  Charges:  $Gait Training: 8-22 mins                    Devin [name], PTA Acute Rehabilitation Services Pager [phone] Office [phone]     Devin [name] 12/05/2017, 1:44 PM

## 2017-12-05 NOTE — Progress Notes (Signed)
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 [name]. Patient awake and alert sitting in bed. States that he is doing "much better" than yesterday. Complains of "mild headache", stable at this time. Demonstrates mild left-sided weakness that was present prior to procedure is stable. Right groin incision c/d/i. Denies dizziness, numbness/tingling, vision changes, hearing changes, tinnitus, or speech difficulty.   Allergies: Bee venom and Crestor [rosuvastatin]  Medications: Prior to Admission medications   Medication Sig Start Date End Date Taking? Authorizing Provider  aspirin EC 325 MG tablet Take 650 mg by mouth See admin instructions. Took two doses today prior to admission   Yes [provider]  aspirin EC 81 MG EC tablet Take 1 tablet (81 mg total) by mouth daily. 11/17/17  Yes Patrecia Pour, MD  glimepiride (AMARYL) 2 MG tablet Take 1 tablet (2 mg total) by mouth daily with breakfast. 11/16/17  Yes Patrecia Pour, MD  levothyroxine (SYNTHROID, LEVOTHROID) 125 MCG tablet Take 1 tablet (125 mcg total) by mouth daily before breakfast. 11/16/17  Yes Patrecia Pour, MD  pantoprazole (PROTONIX) 20 MG tablet Take 1 tablet (20 mg total) by mouth daily. 11/16/17  Yes Patrecia Pour, MD  sacubitril-valsartan (ENTRESTO) 24-26 MG Take 1 tablet by mouth 2 (two) times daily. 11/26/17  Yes BranchAlphonse Guild, MD  ticagrelor (BRILINTA) 90 MG TABS tablet Take 1 tablet (90 mg total) by mouth 2 (two) times daily. 11/16/17  Yes Patrecia Pour, MD  Blood Glucose Monitoring Suppl w/Device KIT Dispense based on patient and insurance preference. Check blood sugar daily before breakfast (ICD9 250.0) 04/29/17   Pollina, Gwenyth Allegra, MD  nitroGLYCERIN (NITROSTAT)  0.4 MG SL tablet Place 0.4 mg under the tongue every 5 (five) minutes as needed for chest pain.    [provider]     Vital Signs: BP 135/88   Pulse 65   Temp 98.1 F (36.7 C) (Oral)   Resp 20   Ht _0  (1.981 m)   Wt 277 lb 5.4 oz (125.8 kg)   SpO2 93%   BMI 32.05 kg/m   Physical Exam  Constitutional: He appears well-developed and well-nourished. No distress.  Pulmonary/Chest: Effort normal. No respiratory distress.  Neurological:  Alert, awake, and oriented x3. Speech and comprehension intact. PERRL bilaterally. EOMs intact bilaterally without nystagmus or subjective diplopia. Visual fields intact. No facial asymmetry. Tongue midline. Can spontaneously move all extremities, mildly weaker left hand grip compared to right- this was present prior to procedure, still stable at this time. No pronator drift. Fine motor and coordination intact and symmetric. Gait not assessed. Romberg not assessed. Heel to toe not assessed. Distal pulses 2+ bilaterally.  Skin: Skin is warm and dry.  Right groin incision soft without active bleeding or hematoma.  Psychiatric: He has a normal mood and affect. His behavior is normal. Judgment and thought content normal.  Nursing note and vitals reviewed.   Imaging: Ct Head Wo Contrast  Result Date: 12/03/2017 CLINICAL DATA:  47 year old male with a history vertebrobasilar atherosclerosis, status post treatment of left vertebral and basilar disease EXAM: CT HEAD WITHOUT CONTRAST TECHNIQUE: Contiguous axial images were obtained from the base of the skull through the vertex without intravenous contrast. COMPARISON:  MR 12/01/2017, CT 11/30/2017 FINDINGS: Brain: No acute intracranial hemorrhage. No midline shift  or mass effect. Gray-white differentiation maintained. Focal hypodensity in left cerebellum, unchanged from comparison studies. Vascular: New stent of the distal left vertebral artery bridging into the basilar artery. Skull: No acute  fracture.  No aggressive bony lesions. Sinuses/Orbits: Partial opacification of ethmoid air cells. Other: None IMPRESSION: No acute intracranial abnormality. Changes of vertebrobasilar treatment with interval stent placement. Electronically Signed   By: Corrie Mckusick D.O.   On: 12/03/2017 13:34   Mr Brain Wo Contrast  Result Date: 12/01/2017 CLINICAL DATA:  47 year old male with left arm paresthesias. 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 EXAM: MRI HEAD WITHOUT CONTRAST TECHNIQUE: Multiplanar, multiecho pulse sequences of the brain and surrounding structures were obtained without intravenous contrast. COMPARISON:  CTA head and neck, head CT 11/30/2017. FINDINGS: Brain: No restricted diffusion or evidence of acute infarction. Small chronic left cerebellar infarct with hemosiderin. No other cerebellar or brainstem signal abnormality. Minimal for age nonspecific cerebral white matter T2 and FLAIR hyperintensity. No cerebral cortical encephalomalacia. No other chronic cerebral blood products. Deep gray matter nuclei are within normal limits. No midline shift, mass effect, evidence of mass lesion, ventriculomegaly, extra-axial collection or acute intracranial hemorrhage. Cervicomedullary junction within normal limits. Pituitary at the upper limits of normal. Vascular: Major intracranial vascular flow voids are preserved, although there is evidence of basilar artery atherosclerosis and stenosis (series 15, image 16). Skull and upper cervical spine: Negative visible cervical spine. Visualized bone marrow signal is within normal limits. Sinuses/Orbits: Normal orbits soft tissues. Mild ethmoid sinus mucosal thickening. Other: Mastoids are well pneumatized. Visible internal auditory structures appear normal. Scalp and face soft tissues appear negative. IMPRESSION: 1. No acute intracranial abnormality. Small chronic left cerebellar  infarct. 2. Evidence of substantial basilar artery atherosclerosis, concordant with the high-grade vertebrobasilar stenosis demonstrated by CTA yesterday. Neuro-Interventional Radiology consultation is suggested to evaluate the appropriateness of potential treatment. Non-emergent evaluation can be arranged by calling [phone] during usual hours. Emergency evaluation can be requested by paging [phone]. Electronically Signed   By: Genevie Ann M.D.   On: 12/01/2017 09:52   Ir Angio Intra Extracran Sel Com Carotid Innominate Bilat Mod Sed  Result Date: 12/04/2017 CLINICAL DATA:  Patient with severe vertebrobasilar ischemic disease. Occluded right vertebral artery at the vertebrobasilar junction, with severe proximal basilar artery stenosis on CT angiogram of the head and neck. EXAM: IR ANGIO VERTEBRAL SEL VERTEBRAL BILAT MOD SED; BILATERAL COMMON CAROTID AND INNOMINATE ANGIOGRAPHY COMPARISON:  CT angiogram of the head and neck of 11/30/2017. MEDICATIONS: Heparin 1000 units IV; no antibiotic was administered within 1 hour of the procedure. ANESTHESIA/SEDATION: Versed 1 mg IV; Fentanyl 25 mcg IV Moderate Sedation Time:  31 minutes The patient was continuously monitored during the procedure by the interventional radiology nurse under my direct supervision. CONTRAST:  Isovue 300 approximately 60 mL FLUOROSCOPY TIME:  Fluoroscopy Time: 9 minutes 0 seconds (1631 mGy). COMPLICATIONS: None immediate. TECHNIQUE: Informed written consent was obtained from the patient after a thorough discussion of the procedural risks, benefits and alternatives. All questions were addressed. Maximal Sterile Barrier Technique was utilized including caps, mask, sterile gowns, sterile gloves, sterile drape, hand hygiene and skin antiseptic. A timeout was performed prior to the initiation of the procedure. The right groin was prepped and draped in the usual sterile fashion. Thereafter using modified Seldinger technique, transfemoral access  into the right common femoral artery was obtained without difficulty. Over a 0.035 inch guidewire, a Leupp  sheath was inserted. Through this, and also over 0.035 inch guidewire, a 5 [place] JB 1 catheter was advanced to the aortic arch region and selectively positioned in the right common carotid artery, the right vertebral artery, the left common carotid artery and the left vertebral artery. FINDINGS: The right vertebral artery origin demonstrates approximately 60-70% stenosis at its origin. The vessel is seen to opacify to the cranial skull base where there is complete occlusion of the artery just distal to the origin of the right posterior-inferior cerebellar artery. The right common carotid arteriogram demonstrates the right external carotid artery and its major branches to be widely patent. The right internal carotid artery at the bulb to the cranial skull base is widely patent. The petrous, the cavernous and the supraclinoid segments are widely patent. The right middle cerebral artery and the right anterior cerebral artery opacify into the capillary and venous phases. A diminutive right posterior communicating artery is seen opacifying transiently the distal basilar artery tip. The left common carotid arteriogram demonstrates the left external carotid artery and its major branches to be normal. The left internal carotid artery at the bulb demonstrates approximately 40-50% stenosis by the NASCET criteria. No acute ulcerations are seen. The left internal carotid artery more distally is seen to opacify normally. The petrous, the cavernous and the supraclinoid segments are widely patent. The left middle cerebral artery and the left anterior cerebral artery opacify into the capillary and venous phases. The left vertebral artery origin demonstrates a mild stenosis at its origin. The vessel is seen to show slow ascent of contrast through the left vertebrobasilar junction. There is slow ascent of contrast  through two areas of severe pre occlusive stenosis of the left vertebrobasilar junction distal to the origin of the left posterior-inferior cerebellar artery. There is an anastomosis between the left posterior-inferior cerebellar artery proximally, and the basilar artery across these 2 areas of severe pre occlusive stenosis. Also demonstrated is approximately 70% stenosis of the proximal basilar artery just distal to the origin of the anterior-inferior cerebellar artery on the right side. Patency of the superior cerebellar artery is noted. No discrete visualization of either of the posterior cerebral arteries is seen. 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 Nonvisualization of the posterior cerebral arteries bilaterally from the left vertebral artery injection. Approximately 60-70% stenosis of the origin of the right vertebral artery. 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 PLAN: Findings reviewed with the patient and the stroke neurologist. Plan endovascular revascularization of the severely pre occlusive stenosis of the left vertebrobasilar junction and the proximal basilar artery. Electronically Signed   By: Luanne Bras M.D.   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 EXAM: IR ANGIO VERTEBRAL SEL VERTEBRAL BILAT MOD SED; BILATERAL COMMON CAROTID AND INNOMINATE ANGIOGRAPHY COMPARISON:  CT angiogram of the head and neck of  11/30/2017. MEDICATIONS: Heparin 1000 units IV; no antibiotic was administered within 1 hour of the procedure. ANESTHESIA/SEDATION: Versed 1 mg IV; Fentanyl 25 mcg IV Moderate Sedation Time:  31 minutes  The patient was continuously monitored during the procedure by the interventional radiology nurse under my direct supervision. CONTRAST:  Isovue 300 approximately 60 mL FLUOROSCOPY TIME:  Fluoroscopy Time: 9 minutes 0 seconds (1631 mGy). COMPLICATIONS: None immediate. TECHNIQUE: Informed written consent was obtained from the patient after a thorough discussion of the procedural risks, benefits and alternatives. All questions were addressed. Maximal Sterile Barrier Technique was utilized including caps, mask, sterile gowns, sterile gloves, sterile drape, hand hygiene and skin antiseptic. A timeout was performed prior to the initiation of the procedure. The right groin was prepped and draped in the usual sterile fashion. Thereafter using modified Seldinger technique, transfemoral access into the right common femoral artery was obtained without difficulty. Over a 0.035 inch guidewire, a 5 French Pinnacle sheath was inserted. Through this, and also over 0.035 inch guidewire, a 5 [place] JB 1 catheter was advanced to the aortic arch region and selectively positioned in the right common carotid artery, the right vertebral artery, the left common carotid artery and the left vertebral artery. FINDINGS: The right vertebral artery origin demonstrates approximately 60-70% stenosis at its origin. The vessel is seen to opacify to the cranial skull base where there is complete occlusion of the artery just distal to the origin of the right posterior-inferior cerebellar artery. The right common carotid arteriogram demonstrates the right external carotid artery and its major branches to be widely patent. The right internal carotid artery at the bulb to the cranial skull base is widely patent. The petrous, the cavernous and the  supraclinoid segments are widely patent. The right middle cerebral artery and the right anterior cerebral artery opacify into the capillary and venous phases. A diminutive right posterior communicating artery is seen opacifying transiently the distal basilar artery tip. The left common carotid arteriogram demonstrates the left external carotid artery and its major branches to be normal. The left internal carotid artery at the bulb demonstrates approximately 40-50% stenosis by the NASCET criteria. No acute ulcerations are seen. The left internal carotid artery more distally is seen to opacify normally. The petrous, the cavernous and the supraclinoid segments are widely patent. The left middle cerebral artery and the left anterior cerebral artery opacify into the capillary and venous phases. The left vertebral artery origin demonstrates a mild stenosis at its origin. The vessel is seen to show slow ascent of contrast through the left vertebrobasilar junction. There is slow ascent of contrast through two areas of severe pre occlusive stenosis of the left vertebrobasilar junction distal to the origin of the left posterior-inferior cerebellar artery. There is an anastomosis between the left posterior-inferior cerebellar artery proximally, and the basilar artery across these 2 areas of severe pre occlusive stenosis. Also demonstrated is approximately 70% stenosis of the proximal basilar artery just distal to the origin of the anterior-inferior cerebellar artery on the right side. Patency of the superior cerebellar artery is noted. No discrete visualization of either of the posterior cerebral arteries is seen. 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 Nonvisualization of the posterior cerebral arteries bilaterally  from the left vertebral artery injection. Approximately 60-70% stenosis of the origin of the right vertebral artery. 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 PLAN: Findings reviewed with the patient and the stroke neurologist. Plan endovascular revascularization  of the severely pre occlusive stenosis of the left vertebrobasilar junction and the proximal basilar artery. Electronically Signed   By: Luanne Bras M.D.   On: 12/02/2017 11:25    Labs:  CBC: Recent Labs    11/30/17 1928 11/30/17 2043 12/01/17 0248 12/03/17 0610 12/04/17 0650  WBC 10.7*  --  10.3 8.4 9.9  HGB 13.5 16.3 12.5* 11.8* 11.8*  HCT 40.8 48.0 39.0 37.1* 37.0*  PLT 296  --  284 250 269    COAGS: Recent Labs    11/30/17 1928  INR 1.02  APTT 35    BMP: Recent Labs    11/16/17 0239 11/30/17 1928 11/30/17 2043 12/01/17 0248 12/03/17 0610 12/04/17 0650  NA 135 140 140  --  139 138  K 3.8 3.9 3.8  --  3.7 3.7  CL 101 106 106  --  109 102  CO2 25 23  --   --  24 24  GLUCOSE 176* 127* 117*  --  153* 147*  BUN 13 22* 24*  --  13 9  CALCIUM 9.3 9.9  --   --  8.7* 9.1  CREATININE 1.27* 1.36* 1.30* 1.18 1.02 0.95  GFRNONAA >60 >60  --  >60 >60 >60  GFRAA >60 >60  --  >60 >60 >60    LIVER FUNCTION TESTS: No results for input(s): BILITOT, AST, ALT, ALKPHOS, PROT, ALBUMIN in the last 8760 hours.  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 [name]. Patient's condition stable- no focal neurologic symptoms besides mild left-sided weakness that was present prior to procedure is stable. Right groin incision stable. Plan to follow-up with Dr. [name] in clinic 2 weeks after discharge. Continue taking Brilinta 90 mg twice daily and Aspirin 81 mg once daily. Appreciate and agree with neurology  management. Please call IR with questions/concerns.   Electronically Signed: Earley Abide, PA-C 12/05/2017, 9:17 AM   I spent a total of 15 Minutes at the the patient's bedside AND on the patient's hospital floor or unit, greater than 50% of which was counseling/coordinating care for left vertebrobasilar junction/proximal basilar artery stenosis s/p revascularization.

## 2017-12-05 NOTE — Progress Notes (Signed)
Pt discharged to home with his sister. Wheeled to car by staff. All education materials given to pt.

## 2017-12-05 NOTE — Discharge Summary (Signed)
Devin [name], is a 47 y.o. male  DOB [birth year]  MRN [id number].  Admission date:  11/30/2017  Admitting Physician  Vianne Bulls, MD  Discharge Date:  12/05/2017   Primary MD  Patient, No Pcp Per  Recommendations for primary care physician for things to follow:   1)Quit Smoking !! 2)You are Taking Aspirin and Brillanta so Avoid ibuprofen/Advil/Aleve/Motrin/Goody Powders/Naproxen/BC powders/Meloxicam/Diclofenac/Indomethacin and other Nonsteroidal anti-inflammatory medications as these will make you more likely to bleed and can cause stomach ulcers, can also cause Kidney problems.  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 Asthma   . Chest pain   . Coronary artery disease   . Dyspnea   . GERD (gastroesophageal reflux disease)   . History of kidney stones   . History of rhabdomyolysis   . Hyperlipidemia   . Hypertension   . Hypothyroidism   . Kidney calculus 2014  . Pericardial effusion    Small, by dobutamine echocardiogram, 04/2006  . Tobacco abuse   . Type 2 diabetes mellitus without complications ([place]) 06/22/8784  . Vitamin D deficiency     Past Surgical History:  Procedure Laterality Date  . 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 CHOLECYSTECTOMY N/A 01/27/2013   Procedure: LAPAROSCOPIC CHOLECYSTECTOMY;  Surgeon:  Jamesetta So, MD;  Location: [facility];  Service: General;  Laterality: N/A;  . CORONARY STENT INTERVENTION N/A 11/15/2017   Procedure: CORONARY STENT INTERVENTION;  Surgeon: Leonie Man, MD;  Location: [place] [facility];  Service: Cardiovascular;  Laterality: N/A;  . HERNIA REPAIR     As a child  . INSERTION OF MESH N/A 11/13/2012   Procedure: INSERTION OF MESH;  Surgeon: Jamesetta So, MD;  Location: [facility];  Service: General;  Laterality: N/A;  . IR ANGIO INTRA EXTRACRAN SEL COM CAROTID INNOMINATE BILAT MOD SED  12/01/2017  . IR ANGIO VERTEBRAL SEL VERTEBRAL BILAT MOD SED  12/01/2017  . KIDNEY STONE SURGERY    . LEFT HEART CATH AND CORONARY ANGIOGRAPHY N/A 11/15/2017   Procedure: LEFT HEART CATH AND CORONARY ANGIOGRAPHY;  Surgeon: Leonie Man, MD;  Location: [place] [facility];  Service: Cardiovascular;  Laterality: N/A;  . PERCUTANEOUS NEPHROLITHOTRIPSY    . RADIOLOGY WITH ANESTHESIA Left 12/03/2017   Procedure: Angioplasty with possible stenting of left VBJ;  Surgeon: Luanne Bras, MD;  Location: [place] [facility];  Service: Radiology;  Laterality: Left;  . UMBILICAL HERNIA REPAIR  N/A 11/13/2012   Procedure: UMBILICAL HERNIORRHAPHY;  Surgeon: Jamesetta So, MD;  Location: [facility];  Service: General;  Laterality: N/A;  . VARICOCELECTOMY         HPI  from the history and physical done on the day of admission:    Devin [name]  is a 47 y.o. male, with history of CAD status post stent placement x3, two weeks ago, came to hospital with sudden onset of left-sided chest pain radiating down the left arm.  He had dizziness and diaphoresis.  During the examination by the ED physician patient was found to have mild weakness of left side including left arm and leg.  Also had numbness.  Code stroke was called.  He was evaluated by tele neurology.  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  Neurology was consulted at  [facility].  And recommended to transfer to [facility] for further evaluation.  Patient denies chest pain at this time. Denies shortness of breath. Denies nausea vomiting or diarrhea. Denies fever or dysuria.     Hospital Course:   Brief Narrative:  47 y.o.male w/ a history of CAD status post stent placement x3 August 2019 who presented to [facility] with sudden onset of left-sided chest pain and numbness of the left arm. A CTa of the head and neck noted an occluded basilar artery with significant vertebral artery stenosis, with negative MRI for acute CVA. Symptoms resolved in the ED, and he was transferred to [facility] for further evaluation and management.   Subjective: Ambulating in hallway with therapist, denies further nausea, no emesis, pressure behind left eye appears to have resolved,   Assessment & Plan:  Basilar artery stenosis w/ TIA- Now s/p leftVBJ and BA stent 12/03/2017 - post-procedure care per Neuro and NeuroIR -d/w neurologist Dr. Erlinda Hong ok to discharge on ASA + Brilinta   CAD s/p stenting 2 weeks ago  Cont ASA + Brilinta - no ACS sxs   Chronic Systolic CHF -stable, appears compensated, EF 35-40% via TTE -   HTN- BP goal postprocedure120 - 160 per Neuro - currently stable within that range   HLD- Intolerant of statins due to rhabdo - cont Zetia   DM2 - uncontrolled-  A1c 8.5 - CBG currently well controlled   Tobacco abuse--smoking cessation encourage  Discharge Condition: stable  Follow UP  Follow-up Information    Deveshwar, Willaim Rayas, MD Follow up in 2 week(s).   Specialties:  Interventional Radiology, Radiology Why:  Please follow-up with Dr. [name] in clinic 2 weeks after discharge. Contact information: [place] [address] [phone]            Consults obtained - NeuroIR and Neurology  Diet and Activity recommendation:  As advised  Discharge Instructions    Discharge Instructions    Call MD for:  difficulty  breathing, headache or visual disturbances   Complete by:  As directed    Call MD for:  persistant dizziness or light-headedness   Complete by:  As directed    Call MD for:  persistant nausea and vomiting   Complete by:  As directed    Call MD for:  severe uncontrolled pain   Complete by:  As directed    Call MD for:  temperature >100.4   Complete by:  As directed    Diet - low sodium heart healthy   Complete by:  As directed  Diet Carb Modified   Complete by:  As directed    Discharge instructions   Complete by:  As directed    1)Quit Smoking !! 2)You are Taking Aspirin and Brillanta so Avoid ibuprofen/Advil/Aleve/Motrin/Goody Powders/Naproxen/BC powders/Meloxicam/Diclofenac/Indomethacin and other Nonsteroidal anti-inflammatory medications as these will make you more likely to bleed and can cause stomach ulcers, can also cause Kidney problems.  3)Follow up with Neurologist as advised   Increase activity slowly   Complete by:  As directed         Discharge Medications     Allergies as of 12/05/2017      Reactions   Bee Venom Swelling   Crestor [rosuvastatin] Other (See Comments)   rhabdomyolosis      Medication List    TAKE these medications   aspirin 81 MG EC tablet Take 1 tablet (81 mg total) by mouth daily. With food What changed:    additional instructions  Another medication with the same name was removed. Continue taking this medication, and follow the directions you see here.   Blood Glucose Monitoring Suppl w/Device Kit Dispense based on patient and insurance preference. Check blood sugar daily before breakfast (ICD9 250.0)   ezetimibe 10 MG tablet Commonly known as:  ZETIA Take 1 tablet (10 mg total) by mouth daily. Start taking on:  12/06/2017   glimepiride 2 MG tablet Commonly known as:  AMARYL Take 1 tablet (2 mg total) by mouth daily with breakfast.   levothyroxine 125 MCG tablet Commonly known as:  SYNTHROID, LEVOTHROID Take 1 tablet (125 mcg  total) by mouth daily before breakfast.   nitroGLYCERIN 0.4 MG SL tablet Commonly known as:  NITROSTAT Place 0.4 mg under the tongue every 5 (five) minutes as needed for chest pain.   pantoprazole 20 MG tablet Commonly known as:  PROTONIX Take 1 tablet (20 mg total) by mouth daily.   sacubitril-valsartan 24-26 MG Commonly known as:  ENTRESTO Take 1 tablet by mouth 2 (two) times daily.   ticagrelor 90 MG Tabs tablet Commonly known as:  BRILINTA Take 1 tablet (90 mg total) by mouth 2 (two) times daily.       Major procedures and Radiology Reports - PLEASE review detailed and final reports for all details, in brief -   Ct Angio Head W Or Wo Contrast  Result Date: 11/30/2017 CLINICAL DATA:  Chest pain, LEFT arm paresthesias. History of diabetes, hypertension, hyperlipidemia. EXAM: CT ANGIOGRAPHY HEAD AND NECK TECHNIQUE: Multidetector CT imaging of the head and neck was performed using the standard protocol during bolus administration of intravenous contrast. Multiplanar CT image reconstructions and MIPs were obtained to evaluate the vascular anatomy. Carotid stenosis measurements (when applicable) are obtained utilizing NASCET criteria, using the distal internal carotid diameter as the denominator. CONTRAST:  75 cc ISOVUE-370 IOPAMIDOL (ISOVUE-370) INJECTION 76% COMPARISON:  None. FINDINGS: CT HEAD FINDINGS BRAIN: No intraparenchymal hemorrhage, mass effect nor midline shift. The ventricles and sulci are normal. No acute large vascular territory infarcts. No abnormal extra-axial fluid collections. 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 Possible small RIGHT cerebellar infarct. Basal cisterns are patent. VASCULAR: Unremarkable. SKULL/SOFT TISSUES: No skull fracture. Small RIGHT parietal convexity hematoma versus scarring. ORBITS/SINUSES: The included ocular globes and orbital contents are normal.Mild lobulated ethmoid mucosal thickening. Mastoid air cells are well aerated. OTHER: None. CTA NECK  FINDINGS: AORTIC ARCH: Normal appearance of the thoracic arch, normal branch pattern. The origins of the innominate, left Common carotid artery and subclavian artery are patent, mild intimal thickening calcific atherosclerosis. RIGHT CAROTID  SYSTEM: Common carotid artery is patent, mild intimal thickening. Mild calcific atherosclerosis of the carotid bifurcation without hemodynamically significant stenosis by NASCET criteria. Normal appearance of the internal carotid artery. LEFT CAROTID SYSTEM: Common carotid artery is patent. Intimal thickening calcific atherosclerosis resultant 50% stenosis LEFT internal carotid artery origin by NASCET criteria. Patent LEFT internal carotid artery. VERTEBRAL ARTERIES:Codominant patent vertebral arteries without luminal irregularity. And SKELETON: No acute osseous process though bone windows have not been submitted. Multiple absent teeth. No advanced cervical spondylosis. OTHER NECK: Soft tissues of the neck are nonacute though, not tailored for evaluation. UPPER CHEST: Please see CT of chest from same day, reported separately. CTA HEAD FINDINGS (venous contamination): ANTERIOR CIRCULATION: Patent cervical internal carotid arteries, petrous, cavernous and supra clinoid internal carotid arteries. Patent anterior communicating artery. Patent anterior and middle cerebral arteries. No large vessel occlusion, significant stenosis, contrast extravasation or aneurysm. POSTERIOR CIRCULATION: Severe stenosis LEFT proximal V4 segment occluded distally. Distal RIGHT V4 occlusion. Occluded proximal basilar artery with immediate though thready reconstitution. Patent PICA. Poorly demonstrated remaining main branch vessels. Tenuous, thready posterior cerebral arteries, diminutive RIGHT P com. No contrast extravasation or aneurysm. VENOUS SINUSES: Major dural venous sinuses are patent though not tailored for evaluation on this angiographic examination. ANATOMIC VARIANTS: None. DELAYED PHASE: No  abnormal intracranial enhancement. MIP images reviewed. IMPRESSION: CT HEAD: 1. 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 Suspected small RIGHT cerebellar infarct. 2. Otherwise negative CT HEAD with and without contrast. CTA NECK: 1. No acute vascular process. 2. 50% stenosis LEFT ICA.  Patent vertebral arteries. CTA HEAD: 1. Limited by venous phase. 2. 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 Attenuated bilateral PCA (potentially obscured by venous structures). Recommend neuro interventional consultation. Critical Value/emergent results were called by telephone at the time of interpretation on 11/30/2017 at 10:15 pm to Dr. Gerlene Fee , who verbally acknowledged these results. Electronically Signed   By: Elon Alas M.D.   On: 11/30/2017 22:20   Dg Chest 2 View  Result Date: 11/30/2017 CLINICAL DATA:  Pt with left side, left arm pain, SOB starting today. Pt passed out during exam. Hx of CAD, DM. Smoker. EXAM: CHEST - 2 VIEW COMPARISON:  11/13/2017 FINDINGS: Cardiac silhouette is normal in size and configuration. Normal mediastinal and hilar contours. Clear lungs.  No pleural effusion or pneumothorax. Skeletal structures are intact. IMPRESSION: No active cardiopulmonary disease. Electronically Signed   By: Lajean Manes M.D.   On: 11/30/2017 19:34   Dg Chest 2 View  Result Date: 11/13/2017 CLINICAL DATA:  Chest pain, shortness of breath, chest heaviness EXAM: CHEST - 2 VIEW COMPARISON:  04/28/2017 FINDINGS: Normal heart size, mediastinal contours, and pulmonary vascularity. Lungs clear. No pleural effusion or pneumothorax. Bones unremarkable. IMPRESSION: Normal exam. Electronically Signed   By: Lavonia Dana M.D.   On: 11/13/2017 16:12   Ct Head Wo Contrast  Result Date: 12/03/2017 CLINICAL DATA:  47 year old male with a history vertebrobasilar atherosclerosis, status post treatment of left vertebral and basilar disease EXAM:  CT HEAD WITHOUT CONTRAST TECHNIQUE: Contiguous axial images were obtained from the base of the skull through the vertex without intravenous contrast. COMPARISON:  MR 12/01/2017, CT 11/30/2017 FINDINGS: Brain: No acute intracranial hemorrhage. No midline shift or mass effect. Gray-white differentiation maintained. Focal hypodensity in left cerebellum, unchanged from comparison studies. Vascular: New stent of the distal left vertebral artery bridging into the basilar artery. Skull: No acute fracture.  No aggressive bony lesions. Sinuses/Orbits: Partial opacification of ethmoid  air cells. Other: None IMPRESSION: No acute intracranial abnormality. Changes of vertebrobasilar treatment with interval stent placement. Electronically Signed   By: Corrie Mckusick D.O.   On: 12/03/2017 13:34   Ct Angio Neck W And/or Wo Contrast  Result Date: 11/30/2017 CLINICAL DATA:  Chest pain, LEFT arm paresthesias. History of diabetes, hypertension, hyperlipidemia. EXAM: CT ANGIOGRAPHY HEAD AND NECK TECHNIQUE: Multidetector CT imaging of the head and neck was performed using the standard protocol during bolus administration of intravenous contrast. Multiplanar CT image reconstructions and MIPs were obtained to evaluate the vascular anatomy. Carotid stenosis measurements (when applicable) are obtained utilizing NASCET criteria, using the distal internal carotid diameter as the denominator. CONTRAST:  75 cc ISOVUE-370 IOPAMIDOL (ISOVUE-370) INJECTION 76% COMPARISON:  None. FINDINGS: CT HEAD FINDINGS BRAIN: No intraparenchymal hemorrhage, mass effect nor midline shift. The ventricles and sulci are normal. No acute large vascular territory infarcts. No abnormal extra-axial fluid collections. 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 Possible small RIGHT cerebellar infarct. Basal cisterns are patent. VASCULAR: Unremarkable. SKULL/SOFT TISSUES: No skull fracture. Small RIGHT parietal convexity hematoma versus scarring. ORBITS/SINUSES: The  included ocular globes and orbital contents are normal.Mild lobulated ethmoid mucosal thickening. Mastoid air cells are well aerated. OTHER: None. CTA NECK FINDINGS: AORTIC ARCH: Normal appearance of the thoracic arch, normal branch pattern. The origins of the innominate, left Common carotid artery and subclavian artery are patent, mild intimal thickening calcific atherosclerosis. RIGHT CAROTID SYSTEM: Common carotid artery is patent, mild intimal thickening. Mild calcific atherosclerosis of the carotid bifurcation without hemodynamically significant stenosis by NASCET criteria. Normal appearance of the internal carotid artery. LEFT CAROTID SYSTEM: Common carotid artery is patent. Intimal thickening calcific atherosclerosis resultant 50% stenosis LEFT internal carotid artery origin by NASCET criteria. Patent LEFT internal carotid artery. VERTEBRAL ARTERIES:Codominant patent vertebral arteries without luminal irregularity. And SKELETON: No acute osseous process though bone windows have not been submitted. Multiple absent teeth. No advanced cervical spondylosis. OTHER NECK: Soft tissues of the neck are nonacute though, not tailored for evaluation. UPPER CHEST: Please see CT of chest from same day, reported separately. CTA HEAD FINDINGS (venous contamination): ANTERIOR CIRCULATION: Patent cervical internal carotid arteries, petrous, cavernous and supra clinoid internal carotid arteries. Patent anterior communicating artery. Patent anterior and middle cerebral arteries. No large vessel occlusion, significant stenosis, contrast extravasation or aneurysm. POSTERIOR CIRCULATION: Severe stenosis LEFT proximal V4 segment occluded distally. Distal RIGHT V4 occlusion. Occluded proximal basilar artery with immediate though thready reconstitution. Patent PICA. Poorly demonstrated remaining main branch vessels. Tenuous, thready posterior cerebral arteries, diminutive RIGHT P com. No contrast extravasation or aneurysm. VENOUS  SINUSES: Major dural venous sinuses are patent though not tailored for evaluation on this angiographic examination. ANATOMIC VARIANTS: None. DELAYED PHASE: No abnormal intracranial enhancement. MIP images reviewed. IMPRESSION: CT HEAD: 1. 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 Suspected small RIGHT cerebellar infarct. 2. Otherwise negative CT HEAD with and without contrast. CTA NECK: 1. No acute vascular process. 2. 50% stenosis LEFT ICA.  Patent vertebral arteries. CTA HEAD: 1. Limited by venous phase. 2. 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 Attenuated bilateral PCA (potentially obscured by venous structures). Recommend neuro interventional consultation. Critical Value/emergent results were called by telephone at the time of interpretation on 11/30/2017 at 10:15 pm to Dr. Gerlene Fee , who verbally acknowledged these results. Electronically Signed   By: Elon Alas M.D.   On: 11/30/2017 22:20   Mr Brain Wo Contrast  Result Date: 12/01/2017 CLINICAL DATA:  47 year old male with  left arm paresthesias. 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 EXAM: MRI HEAD WITHOUT CONTRAST TECHNIQUE: Multiplanar, multiecho pulse sequences of the brain and surrounding structures were obtained without intravenous contrast. COMPARISON:  CTA head and neck, head CT 11/30/2017. FINDINGS: Brain: No restricted diffusion or evidence of acute infarction. Small chronic left cerebellar infarct with hemosiderin. No other cerebellar or brainstem signal abnormality. Minimal for age nonspecific cerebral white matter T2 and FLAIR hyperintensity. No cerebral cortical encephalomalacia. No other chronic cerebral blood products. Deep gray matter nuclei are within normal limits. No midline shift, mass effect, evidence of mass lesion, ventriculomegaly, extra-axial collection or  acute intracranial hemorrhage. Cervicomedullary junction within normal limits. Pituitary at the upper limits of normal. Vascular: Major intracranial vascular flow voids are preserved, although there is evidence of basilar artery atherosclerosis and stenosis (series 15, image 16). Skull and upper cervical spine: Negative visible cervical spine. Visualized bone marrow signal is within normal limits. Sinuses/Orbits: Normal orbits soft tissues. Mild ethmoid sinus mucosal thickening. Other: Mastoids are well pneumatized. Visible internal auditory structures appear normal. Scalp and face soft tissues appear negative. IMPRESSION: 1. No acute intracranial abnormality. Small chronic left cerebellar infarct. 2. Evidence of substantial basilar artery atherosclerosis, concordant with the high-grade vertebrobasilar stenosis demonstrated by CTA yesterday. Neuro-Interventional Radiology consultation is suggested to evaluate the appropriateness of potential treatment. Non-emergent evaluation can be arranged by calling [phone] during usual hours. Emergency evaluation can be requested by paging [phone]. Electronically Signed   By: Genevie Ann M.D.   On: 12/01/2017 09:52   Ct Coronary Morph W/cta Cor W/score W/ca W/cm &/or Wo/cm  Addendum Date: 11/15/2017   ADDENDUM REPORT: 11/15/2017 08:14 EXAM: OVER-READ INTERPRETATION  CT CHEST The following report is an over-read performed by radiologist Dr. Eben Burow [facility] on 11/15/2017. This over-read does not include interpretation of cardiac or coronary anatomy or pathology. The cardiac/coronary CT interpretation by the cardiologist is attached. COMPARISON:  None. FINDINGS: The visualized portions of the lower lung fields show no suspicious nodules, masses, or infiltrates. The visualized portions of the mediastinum and chest wall are unremarkable. IMPRESSION: No significant non-cardiac abnormality in visualized portion of the thorax. Electronically Signed   By:  Earle Gell M.D.   On: 11/15/2017 08:14   Result Date: 11/15/2017 CLINICAL DATA:  47 year old-male with known non-obstructive CAD on a cardiac catheterization in 2014, calcium score at the time 65. EXAM: Cardiac/Coronary  CT TECHNIQUE: The patient was scanned on a [employer]. FINDINGS: A 120 kV prospective scan was triggered in the descending thoracic aorta at 111 HU's. Axial non-contrast 3 mm slices were carried out through the heart. The data set was analyzed on a dedicated work station and scored using the [place]. Gantry rotation speed was 250 msecs and collimation was .6 mm. No beta blockade and 0.4 mg of sl NTG was given. The 3D data set was reconstructed in 5% intervals of the 67-82 % of the R-R cycle. Diastolic phases were analyzed on a dedicated work station using MPR, MIP and VRT modes. The patient received 80 cc of contrast. Aorta:  Normal size.  No calcifications.  No dissection. Aortic Valve:  Trileaflet.  No calcifications. Coronary Arteries:  Normal coronary origin.  Left dominance. Left main is a large artery that gives rise to LAD and LCX arteries. Left main has no significant stenosis. LAD is a large vessel that is severely diffusely diseased. There is a complex, long  severe, predominantly non-calcified plaque in the proximal LAD with stenoses > 70%, this lesion has high risks features. Mid LAD has severe diffuse mostly calcified plaque with a focal stenosis of at least 50-69% but possibly > 70%, distal LAD has small lumen and diffuse plaque. D1 is severely diffusely diseased. LCX is large dominant artery that gives rise to two OM branches, PDA and PLA. There is mild non-calcified plaque in the proximal portion with stenosis 25-50%. OM1 has no significant stenosis. OM2 has ostial 50-69%. PDA is poorly visualized and has possible severe stenosis in the ostial portion. RCA is a small non-dominant artery that is diffusely diseased and occluded in the proximal portion. Other  findings: Normal pulmonary vein drainage into the left atrium. Normal let atrial appendage without a thrombus. IMPRESSION: 1. Coronary calcium score of 259. This was 51 percentile for age and sex matched control. 2. Normal coronary origin with left dominance. 3. Study quality affected by patient's size and suboptimal vasodilation. However, there is a severe high risk plaque in the proximal LAD with stenosis > 70% and possible severe stenosis in the mid LAD and first diagonal artery. A cardiac catheterization is recommended. 4. Dilated pulmonary artery measuring 34 mm suggestive of pulmonary hypertension. Electronically Signed: By: Ena Dawley On: 11/14/2017 17:59   Ct Angio Chest/abd/pel For Dissection W And/or Wo Contrast  Result Date: 11/30/2017 CLINICAL DATA:  Patient complains of crushing chest pain x2 hours with left arm paresthesias and pain. Patient reports taking 2 ASA prior to arrival unable to state how much. He continues to endorse chest pain and SOB. Objectively, patient is diaphoretic and is ill appearing. Cardiac and pulmonary exam unremarkable EXAM: CT ANGIOGRAPHY CHEST, ABDOMEN AND PELVIS TECHNIQUE: Multidetector CT imaging through the chest, abdomen and pelvis was performed using the standard protocol during bolus administration of intravenous contrast. Multiplanar reconstructed images and MIPs were obtained and reviewed to evaluate the vascular anatomy. CONTRAST:  156m ISOVUE-370 IOPAMIDOL (ISOVUE-370) INJECTION 76%, <See Chart> ISOVUE-370 IOPAMIDOL (ISOVUE-370) INJECTION 76% COMPARISON:  Current chest radiograph. Abdomen pelvis CT, 01/25/2013. FINDINGS: CTA CHEST FINDINGS Cardiovascular: Heart normal in size and configuration. Left coronary artery calcifications. No pericardial effusion. Aorta is normal in caliber. No dissection. Minor atherosclerotic plaque noted along the distal arch and descending thoracic aorta. Mild noncalcified plaque in the left subclavian artery with no  significant stenosis. Subclavian and carotid arteries are widely patent. Vertebral arteries are widely patent. Mediastinum/Nodes: No neck base or axillary masses or adenopathy. No mediastinal or hilar masses or pathologically enlarged lymph nodes. Trachea and esophagus are unremarkable. Lungs/Pleura: Minor dependent subsegmental atelectasis. Posterior upper lobe paraseptal emphysema. No lung mass or suspicious nodule. No evidence of pneumonia or pulmonary edema. No pleural effusion or pneumothorax. Musculoskeletal: No fracture or acute finding. No osteoblastic or osteolytic lesions. Review of the MIP images confirms the above findings. CTA ABDOMEN AND PELVIS FINDINGS VASCULAR Aorta: Normal in caliber. Mild atherosclerotic plaque. No significant stenosis. No dissection. Celiac: Patent without evidence of aneurysm, dissection, vasculitis or significant stenosis. SMA: Patent without evidence of aneurysm, dissection, vasculitis or significant stenosis. Renals: Both renal arteries are patent without evidence of aneurysm, dissection, vasculitis, fibromuscular dysplasia or significant stenosis. Single right renal artery. Two left renal arteries. IMA: Patent without evidence of aneurysm, dissection, vasculitis or significant stenosis. Inflow: Patent without evidence of aneurysm, dissection, vasculitis or significant stenosis. Veins: No obvious venous abnormality within the limitations of this arterial phase study. Review of the MIP images confirms the above findings. NON-VASCULAR Hepatobiliary: No  focal liver abnormality is seen. Status post cholecystectomy. No biliary dilatation. Pancreas: Unremarkable. No pancreatic ductal dilatation or surrounding inflammatory changes. Spleen: Normal in size without focal abnormality. Adrenals/Urinary Tract: No adrenal masses. Kidneys are normal in size, orientation and position. 4 mm low-density lesion anterior left kidney midpole, consistent with a cyst. No other renal masses or  lesions. No stones. No hydronephrosis. Normal ureters. Bladder is unremarkable. Stomach/Bowel: Stomach is within normal limits. Appendix appears normal. No evidence of bowel wall thickening, distention, or inflammatory changes. Lymphatic: Prominent gastrohepatic ligament lymph nodes, largest measuring 12 mm in short axis. Scattered subcentimeter retroperitoneal lymph nodes. Reproductive: Unremarkable. Other: No abdominal wall hernia or abnormality. No abdominopelvic ascites. Musculoskeletal: No fracture or acute finding. No osteoblastic or osteolytic lesions. Review of the MIP images confirms the above findings. IMPRESSION: CTA FINDINGS 1. No dissection of the thoracoabdominal aorta.  No aneurysm. 2. Minor atherosclerosis of the thoracic aorta. Mild atherosclerosis of the abdominal aorta. No significant stenosis any of the aortic branch vessels. 3. Left coronary artery calcifications. NON CTA FINDINGS 1. No acute findings within the chest, abdomen or pelvis. 2. Mild upper lobe paraseptal emphysema. Electronically Signed   By: Lajean Manes M.D.   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 EXAM: IR ANGIO VERTEBRAL SEL VERTEBRAL BILAT MOD SED; BILATERAL COMMON CAROTID AND INNOMINATE ANGIOGRAPHY COMPARISON:  CT angiogram of the head and neck of 11/30/2017. MEDICATIONS: Heparin 1000 units IV; no antibiotic was administered within 1 hour of the procedure. ANESTHESIA/SEDATION: Versed 1 mg IV; Fentanyl 25 mcg IV Moderate Sedation Time:  31 minutes The patient was continuously monitored during the procedure by the interventional radiology nurse under my direct supervision. CONTRAST:  Isovue 300 approximately 60 mL FLUOROSCOPY TIME:   Fluoroscopy Time: 9 minutes 0 seconds (1631 mGy). COMPLICATIONS: None immediate. TECHNIQUE: Informed written consent was obtained from the patient after a thorough discussion of the procedural risks, benefits and alternatives. All questions were addressed. Maximal Sterile Barrier Technique was utilized including caps, mask, sterile gowns, sterile gloves, sterile drape, hand hygiene and skin antiseptic. A timeout was performed prior to the initiation of the procedure. The right groin was prepped and draped in the usual sterile fashion. Thereafter using modified Seldinger technique, transfemoral access into the right common femoral artery was obtained without difficulty. Over a 0.035 inch guidewire, a 5 French Pinnacle sheath was inserted. Through this, and also over 0.035 inch guidewire, a 5 [place] JB 1 catheter was advanced to the aortic arch region and selectively positioned in the right common carotid artery, the right vertebral artery, the left common carotid artery and the left vertebral artery. FINDINGS: The right vertebral artery origin demonstrates approximately 60-70% stenosis at its origin. The vessel is seen to opacify to the cranial skull base where there is complete occlusion of the artery just distal to the origin of the right posterior-inferior cerebellar artery. The right common carotid arteriogram demonstrates the right external carotid artery and its major branches to be widely patent. The right internal carotid artery at the bulb to the cranial skull base is widely patent. The petrous, the cavernous and the supraclinoid segments are widely patent. The right middle cerebral artery and the right anterior cerebral artery opacify into the capillary and venous  phases. A diminutive right posterior communicating artery is seen opacifying transiently the distal basilar artery tip. The left common carotid arteriogram demonstrates the left external carotid artery and its major branches to be normal. The left  internal carotid artery at the bulb demonstrates approximately 40-50% stenosis by the NASCET criteria. No acute ulcerations are seen. The left internal carotid artery more distally is seen to opacify normally. The petrous, the cavernous and the supraclinoid segments are widely patent. The left middle cerebral artery and the left anterior cerebral artery opacify into the capillary and venous phases. The left vertebral artery origin demonstrates a mild stenosis at its origin. The vessel is seen to show slow ascent of contrast through the left vertebrobasilar junction. There is slow ascent of contrast through two areas of severe pre occlusive stenosis of the left vertebrobasilar junction distal to the origin of the left posterior-inferior cerebellar artery. There is an anastomosis between the left posterior-inferior cerebellar artery proximally, and the basilar artery across these 2 areas of severe pre occlusive stenosis. Also demonstrated is approximately 70% stenosis of the proximal basilar artery just distal to the origin of the anterior-inferior cerebellar artery on the right side. Patency of the superior cerebellar artery is noted. No discrete visualization of either of the posterior cerebral arteries is seen. 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 Nonvisualization of the posterior cerebral arteries bilaterally from the left vertebral artery injection. Approximately 60-70% stenosis of the origin of the right vertebral artery. 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 PLAN: Findings reviewed with the patient and the stroke neurologist. Plan endovascular revascularization of the severely pre occlusive stenosis  of the left vertebrobasilar junction and the proximal basilar artery. Electronically Signed   By: Luanne Bras M.D.   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 EXAM: IR ANGIO VERTEBRAL SEL VERTEBRAL BILAT MOD SED; BILATERAL COMMON CAROTID AND INNOMINATE ANGIOGRAPHY COMPARISON:  CT angiogram of the head and neck of 11/30/2017. MEDICATIONS: Heparin 1000 units IV; no antibiotic was administered within 1 hour of the procedure. ANESTHESIA/SEDATION: Versed 1 mg IV; Fentanyl 25 mcg IV Moderate Sedation Time:  31 minutes The patient was continuously monitored during the procedure by the interventional radiology nurse under my direct supervision. CONTRAST:  Isovue 300 approximately 60 mL FLUOROSCOPY TIME:  Fluoroscopy Time: 9 minutes 0 seconds (1631 mGy). COMPLICATIONS: None immediate. TECHNIQUE: Informed written consent was obtained from the patient after a thorough discussion of the procedural risks, benefits and alternatives. All questions were addressed. Maximal Sterile Barrier Technique was utilized including caps, mask, sterile gowns, sterile gloves, sterile drape, hand hygiene and skin antiseptic. A timeout was performed prior to the initiation of the procedure. The right groin was prepped and draped in the usual sterile fashion. Thereafter using modified Seldinger technique, transfemoral access into the right common femoral artery was obtained without difficulty. Over a 0.035 inch guidewire, a 5 French Pinnacle sheath was inserted. Through this, and also over 0.035 inch guidewire, a 5 [place] JB 1 catheter was advanced to the aortic  arch region and selectively positioned in the right common carotid artery, the right vertebral artery, the left  common carotid artery and the left vertebral artery. FINDINGS: The right vertebral artery origin demonstrates approximately 60-70% stenosis at its origin. The vessel is seen to opacify to the cranial skull base where there is complete occlusion of the artery just distal to the origin of the right posterior-inferior cerebellar artery. The right common carotid arteriogram demonstrates the right external carotid artery and its major branches to be widely patent. The right internal carotid artery at the bulb to the cranial skull base is widely patent. The petrous, the cavernous and the supraclinoid segments are widely patent. The right middle cerebral artery and the right anterior cerebral artery opacify into the capillary and venous phases. A diminutive right posterior communicating artery is seen opacifying transiently the distal basilar artery tip. The left common carotid arteriogram demonstrates the left external carotid artery and its major branches to be normal. The left internal carotid artery at the bulb demonstrates approximately 40-50% stenosis by the NASCET criteria. No acute ulcerations are seen. The left internal carotid artery more distally is seen to opacify normally. The petrous, the cavernous and the supraclinoid segments are widely patent. The left middle cerebral artery and the left anterior cerebral artery opacify into the capillary and venous phases. The left vertebral artery origin demonstrates a mild stenosis at its origin. The vessel is seen to show slow ascent of contrast through the left vertebrobasilar junction. There is slow ascent of contrast through two areas of severe pre occlusive stenosis of the left vertebrobasilar junction distal to the origin of the left posterior-inferior cerebellar artery. There is an anastomosis between the left posterior-inferior cerebellar artery proximally, and the basilar artery across these 2 areas of severe pre occlusive stenosis. Also demonstrated is  approximately 70% stenosis of the proximal basilar artery just distal to the origin of the anterior-inferior cerebellar artery on the right side. Patency of the superior cerebellar artery is noted. No discrete visualization of either of the posterior cerebral arteries is seen. 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 Nonvisualization of the posterior cerebral arteries bilaterally from the left vertebral artery injection. Approximately 60-70% stenosis of the origin of the right vertebral artery. 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 PLAN: Findings reviewed with the patient and the stroke neurologist. Plan endovascular revascularization of the severely pre occlusive stenosis of the left vertebrobasilar junction and the proximal basilar artery. Electronically Signed   By: Luanne Bras M.D.   On: 12/02/2017 11:25    Micro Results    Recent Results (from the past 240 hour(s))  MRSA PCR Screening     Status: None   Collection Time: 12/03/17  7:30 PM  Result Value Ref Range Status   MRSA by PCR NEGATIVE NEGATIVE Final    Comment:        The GeneXpert MRSA Assay (FDA approved for NASAL specimens only), is one component of a comprehensive MRSA colonization surveillance program. It is not intended to diagnose MRSA infection nor to guide or monitor treatment for MRSA infections. Performed at [facility], [place] [address] [place], [place] [address]    Today   Subjective    Devin [name] today has no  new complaints, ambulating with therapist in hallways, eager to go home          Patient has been seen and examined prior to discharge   Objective   Blood pressure 127/78, pulse  63, temperature 98.1 F (36.7 C), temperature source Oral, resp. rate (!) 8, height 6' 6" (1.981 m), weight 125.8 kg, SpO2 94 %.   Intake/Output Summary (Last 24 hours) at 12/05/2017 1009 Last data filed at 12/05/2017 0800 Gross per 24 hour  Intake 1458.21 ml  Output 2400 ml  Net -941.79 ml    Exam Gen:- Awake Alert,  In no apparent distress  HEENT:- [place].AT, No sclera icterus Neck-Supple Neck,No JVD,.  Lungs-  CTAB , fair air movement CV- S1, S2 normal,, regular Abd-  +ve B.Sounds, Abd Soft, No tenderness,    Extremity/Skin:- No  edema,   good pulses Psych-affect is appropriate, oriented x3 Neuro-no new focal deficits, no tremors   Data Review   CBC w Diff:  Lab Results  Component Value Date   WBC 9.9 12/04/2017   HGB 11.8 (L) 12/04/2017   HCT 37.0 (L) 12/04/2017   PLT 269 12/04/2017   LYMPHOPCT 25 12/04/2017   MONOPCT 5 12/04/2017   EOSPCT 1 12/04/2017   BASOPCT 1 12/04/2017    CMP:  Lab Results  Component Value Date   NA 138 12/04/2017   K 3.7 12/04/2017   CL 102 12/04/2017   CO2 24 12/04/2017   BUN 9 12/04/2017   CREATININE 0.95 12/04/2017   PROT 8.3 10/07/2013   ALBUMIN 4.4 10/07/2013   BILITOT 0.4 10/07/2013   ALKPHOS 78 10/07/2013   AST 80 (H) 10/07/2013   ALT 62 (H) 10/07/2013  .   Total Discharge time is about 33 minutes  Roxan Hockey M.D on 12/05/2017 at 10:09 AM  Pager---[phone]  Go to [email or web address] - password [email or web address] for contact info  [facility] - Office  [phone]

## 2017-12-07 ENCOUNTER — Encounter (HOSPITAL_COMMUNITY): Payer: Self-pay | Admitting: Interventional Radiology

## 2017-12-10 ENCOUNTER — Other Ambulatory Visit (HOSPITAL_COMMUNITY): Payer: Self-pay | Admitting: Interventional Radiology

## 2017-12-10 ENCOUNTER — Other Ambulatory Visit: Payer: Self-pay

## 2017-12-10 DIAGNOSIS — I771 Stricture of artery: Secondary | ICD-10-CM

## 2017-12-10 NOTE — Patient Outreach (Signed)
[facility]) Care Management  12/10/2017  Devin [name] 1970-08-17 [id number]     EMMI-STROKE-NOT ON APL RED ON EMMI ALERT Day # 3 Date: 12/08/17 Red Alert Reason: " Questions/problems with meds? Yes"   Outreach attempt # 1 to patient. No answer at present.       Plan:RN CM will make outreach attempt to patient within 3-4 business days.    Antionette Fairy, RN,BSN,CCM [facility] Care Management Telephonic Care Management Coordinator Direct Phone: [phone] Toll Free: [phone] Fax: [phone]

## 2017-12-11 ENCOUNTER — Other Ambulatory Visit: Payer: Self-pay

## 2017-12-11 NOTE — Patient Outreach (Signed)
[facility]) Care Management  12/11/2017  Devin [name] 1970-11-11 [id number]   EMMI-STROKE-NOT ON APL RED ON EMMI ALERT Day # 3 Date: 12/08/17 Red Alert Reason: " Questions/problems with meds? Yes"    Outreach attempt #2 to patient. RN CM reviewed and addressed red alert. Error in patient's response. No issues or questions regarding his meds at this time. No further RN CM needs or concerns voiced at this time. Advised patient that they would continue to get automated EMMI-Stroke post discharge calls to assess how they are doing following recent hospitalization and will receive a call from a nurse if any of their responses were abnormal. Patient voiced understanding and was appreciative of f/u call.   Plan: RN CM will close case at this time.   Antionette Fairy, RN,BSN,CCM [facility] Care Management Telephonic Care Management Coordinator Direct Phone: [phone] Toll Free: [phone] Fax: [phone]

## 2017-12-12 ENCOUNTER — Other Ambulatory Visit: Payer: Self-pay

## 2017-12-12 NOTE — Patient Outreach (Signed)
[facility]) Care Management  12/12/2017  Devin [name] 1970/06/23 [id number]     EMMI-STROKE RED ON EMMI ALERT Day #6 Date: 12/11/17 Red Alert Reason:"Smoked or been around smoke? Yes"    Outreach attempt #1 to patient. No answer at present. RN CM left HIPAA compliant voicemail message along with contact info.         Plan: RN CM will make outreach attempt to patient within 3-4 business days.   Antionette Fairy, RN,BSN,CCM [facility] Care Management Telephonic Care Management Coordinator Direct Phone: [phone] Toll Free: [phone] Fax: [phone]

## 2017-12-13 ENCOUNTER — Other Ambulatory Visit: Payer: Self-pay

## 2017-12-13 NOTE — Patient Outreach (Signed)
[facility]) Care Management  12/13/2017  Devin [name] 1970-07-14 [id number]   EMMI-STROKE RED ON EMMI ALERT Day #6 Date: 12/11/17 Red Alert Reason:"Smoked or been around smoke? Yes"    Outreach attempt # 2 to patient. No answer at present and unable to leave message.      Plan: RN CM will make outreach attempt to patient within 3-4 business days.   Antionette Fairy, RN,BSN,CCM [facility] Care Management Telephonic Care Management Coordinator Direct Phone: [phone] Toll Free: [phone] Fax: [phone]

## 2017-12-14 ENCOUNTER — Other Ambulatory Visit: Payer: Self-pay

## 2017-12-14 NOTE — Patient Outreach (Signed)
[facility]) Care Management  12/14/2017  ZHANE BLUITT Jul 13, 1970 [id number]   EMMI-STROKE RED ON EMMI ALERT Day #6 Date:12/11/17 Red Alert Reason:"Smoked or been around smoke? Yes"   Outreach attempt #3 to patient. No answer at present.     Plan: RN CM will close case at this time due to unable to reach pt.  Antionette Fairy, RN,BSN,CCM [facility] Care Management Telephonic Care Management Coordinator Direct Phone: [phone] Toll Free: [phone] Fax: [phone]

## 2018-01-01 ENCOUNTER — Telehealth: Payer: Self-pay | Admitting: Cardiology

## 2018-01-01 NOTE — Telephone Encounter (Signed)
Patient assistance forms for Brilinta and Entresto placed at front desk with 2 sample of 90 mg bottles of Brilinta. Sister notified. 30 day free trial card for [facility] placed with forms.

## 2018-01-01 NOTE — Telephone Encounter (Signed)
Requesting samples of Brilinta and Entresto / tg

## 2018-01-02 ENCOUNTER — Ambulatory Visit (HOSPITAL_COMMUNITY)
Admission: RE | Admit: 2018-01-02 | Discharge: 2018-01-02 | Disposition: A | Discharge status: Home / Self Care | Payer: Self-pay | Source: Ambulatory Visit | Attending: Interventional Radiology | Admitting: Interventional Radiology

## 2018-01-02 DIAGNOSIS — I771 Stricture of artery: Secondary | ICD-10-CM

## 2018-01-02 NOTE — Progress Notes (Signed)
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  Referring Physician(s): Iraq, Marge Duncans; Won, Mai V  Supervising Physician: Luanne Bras  Patient Status: [name]  History of Present Illness: Devin [name] is a 47 y.o. male with a past medical history of hypertension, hyperlipidemia, collagen vascular disease, pericardial effusion 04/2006, CAD, CVA 11/2017, asthma, GERD, nephrolithiasis, renal insufficiency, hypothyroidism, diabetes mellitus type II, vitamin D deficiency, sickle cell anemia, multiple myeloma, rhabdomyolysis, and tobacco abuse. He is known to [facility] and has been followed by Dr. [name] since 11/2017. He first presented as a code stroke in 11/2017. He underwent an image-guided diagnostic cerebral angiogram 12/01/2017 with Dr. 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 He then underwent an image-guided cerebral angiogram with revascularization of his vertebrobasilar junction/proximal basilar artery stenosis using stent assisted angioplasty 12/03/2017 by Dr. [name]. He was discharged home 12/05/2017 in stable condition.  Patient presents today for follow-up to discuss his recent procedure 12/03/2017. Patient awake and alert sitting in chair. Accompanied by sister and nephew. Complains of generalized fatigue since procedure 12/03/2017. Complains of worsening memory/depression since procedure 12/03/2017. Complains of constant headaches. States he wakes up with headaches that begin in his right posterior head and migrate to his right side/frontal head. Rates headaches as 10/10 and states severity decreases as the day progresses. Denies N/V associated with headaches. Complains of intermittent dizziness. States this occurs when he is walking. States it lasts a few minutes and subsides. Denies syncope associated with  dizziness. Complains of transient bilateral arm numbness. States which side his numbness is on varies. Complains of transient bilateral hand weakness. Complains of intermittent tunneled vision. States that it only occurs when he is using both eyes (as in when he covers left or right eye his vision is normal). Complains of blurred and double vision in AM. States which eye has vision disturbances varies. Complains of daily right-sided pulsatile tinnitus. States he wakes up with this and it subsides in a few hours. Complains of bilateral hearing loss. Complains of unilateral sweating. States that one side of his body will be saturated in sweat, while the other side is "freezing". States which side of his body is sweating/"freezing" varies. Denies speech difficulty.  Patient is currently taking Brilinta 90 mg twice daily and Aspirin 81 mg once daily.   Past Medical History:  Diagnosis Date  . Asthma   . Chest pain   . Coronary artery disease   . Dyspnea   . GERD (gastroesophageal reflux disease)   . History of kidney stones   . History of rhabdomyolysis   . Hyperlipidemia   . Hypertension   . Hypothyroidism   . Kidney calculus 2014  . Pericardial effusion    Small, by dobutamine echocardiogram, 04/2006  . Tobacco abuse   . Type 2 diabetes mellitus without complications ([place]) 04/14/5425  . Vitamin D deficiency     Past Surgical History:  Procedure Laterality Date  . 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 CHOLECYSTECTOMY N/A 01/27/2013   Procedure: LAPAROSCOPIC CHOLECYSTECTOMY;  Surgeon: Jamesetta So, MD;  Location: [facility];  Service: General;  Laterality: N/A;  . CORONARY STENT INTERVENTION N/A 11/15/2017   Procedure: CORONARY STENT INTERVENTION;  Surgeon: Leonie Man, MD;  Location: [place] [facility];  Service: Cardiovascular;  Laterality: N/A;  . HERNIA REPAIR  As a child  . INSERTION  OF MESH N/A 11/13/2012   Procedure: INSERTION OF MESH;  Surgeon: Jamesetta So, MD;  Location: [facility];  Service: General;  Laterality: N/A;  . IR ANGIO INTRA EXTRACRAN SEL COM CAROTID INNOMINATE BILAT MOD SED  12/01/2017  . IR ANGIO VERTEBRAL SEL VERTEBRAL BILAT MOD SED  12/01/2017  . IR INTRA CRAN STENT  12/03/2017  . KIDNEY STONE SURGERY    . LEFT HEART CATH AND CORONARY ANGIOGRAPHY N/A 11/15/2017   Procedure: LEFT HEART CATH AND CORONARY ANGIOGRAPHY;  Surgeon: Leonie Man, MD;  Location: [place] [facility];  Service: Cardiovascular;  Laterality: N/A;  . PERCUTANEOUS NEPHROLITHOTRIPSY    . RADIOLOGY WITH ANESTHESIA Left 12/03/2017   Procedure: Angioplasty with possible stenting of left VBJ;  Surgeon: Luanne Bras, MD;  Location: [place];  Service: Radiology;  Laterality: Left;  . UMBILICAL HERNIA REPAIR N/A 11/13/2012   Procedure: UMBILICAL HERNIORRHAPHY;  Surgeon: Jamesetta So, MD;  Location: [facility];  Service: General;  Laterality: N/A;  . VARICOCELECTOMY      Allergies: Bee venom and Crestor [rosuvastatin]  Medications: Prior to Admission medications   Medication Sig Start Date End Date Taking? Authorizing Provider  aspirin 81 MG EC tablet Take 1 tablet (81 mg total) by mouth daily. With food 12/05/17   Roxan Hockey, MD  Blood Glucose Monitoring Suppl w/Device KIT Dispense based on patient and insurance preference. Check blood sugar daily before breakfast (ICD9 250.0) 04/29/17   Pollina, Gwenyth Allegra, MD  ezetimibe (ZETIA) 10 MG tablet Take 1 tablet (10 mg total) by mouth daily. 12/06/17   Roxan Hockey, MD  glimepiride (AMARYL) 2 MG tablet Take 1 tablet (2 mg total) by mouth daily with breakfast. 11/16/17   Patrecia Pour, MD  levothyroxine (SYNTHROID, LEVOTHROID) 125 MCG tablet Take 1 tablet (125 mcg total) by mouth daily before breakfast. 11/16/17   Patrecia Pour, MD  nitroGLYCERIN (NITROSTAT) 0.4 MG SL tablet Place 0.4 mg under the tongue every 5 (five) minutes as needed for  chest pain.    [provider]  pantoprazole (PROTONIX) 20 MG tablet Take 1 tablet (20 mg total) by mouth daily. 11/16/17   Patrecia Pour, MD  sacubitril-valsartan (ENTRESTO) 24-26 MG Take 1 tablet by mouth 2 (two) times daily. 11/26/17   Arnoldo Lenis, MD  ticagrelor (BRILINTA) 90 MG TABS tablet Take 1 tablet (90 mg total) by mouth 2 (two) times daily. 11/16/17   Patrecia Pour, MD     Family History  Problem Relation Age of Onset  . Coronary artery disease Father   . Hypertension Father   . Hyperlipidemia Father   . Diabetes Father   . Congestive Heart Failure Father 48  . Arrhythmia Father        had an ICD  . Diabetes Mother   . Hypertension Mother   . Hyperlipidemia Mother   . Obesity Mother 70       died after bariatric surgery, liver failure and infection  . Coronary artery disease Maternal Grandfather        both grandfathers and several uncles  . Coronary artery disease Unknown   . Diabetes Sister        both sisters    Social History   Socioeconomic History  . Marital status: Divorced    Spouse name: Not on file  . Number of children: Not on file  . Years of education: Not on file  . Highest education level:  Not on file  Occupational History    Comment:      Employer: [employer]  Social Needs  . Financial resource strain: Not on file  . Food insecurity:    Worry: Not on file    Inability: Not on file  . Transportation needs:    Medical: Not on file    Non-medical: Not on file  Tobacco Use  . Smoking status: Current Some Day Smoker    Packs/day: 0.50    Years: 26.00    Pack years: 13.00    Types: Cigarettes    Start date: 03/13/1985  . Smokeless tobacco: Former Systems developer    Types: Snuff, Chew    Quit date: 03/14/1987  . Tobacco comment: started smoking at age 30. chewed and dipped for 2 years  Substance and Sexual Activity  . Alcohol use: No  . Drug use: No  . Sexual activity: Not on file  Lifestyle  . Physical activity:    Days per week: Not on  file    Minutes per session: Not on file  . Stress: Not on file  Relationships  . Social connections:    Talks on phone: Not on file    Gets together: Not on file    Attends religious service: Not on file    Active member of club or organization: Not on file    Attends meetings of clubs or organizations: Not on file    Relationship status: Not on file  Other Topics Concern  . Not on file  Social History Narrative   Eats fast food daily   No exercise outside of work   Lives in [place] with his dog   MAINTENANCE TECHNICIAN     Review of Systems: A 12 point ROS discussed and pertinent positives are indicated in the HPI above.  All other systems are negative.  Review of Systems  Constitutional: Positive for diaphoresis and fatigue. Negative for chills and fever.  HENT: Positive for hearing loss and tinnitus.   Eyes: Positive for visual disturbance.  Respiratory: Negative for shortness of breath and wheezing.   Cardiovascular: Negative for chest pain and palpitations.  Neurological: Positive for dizziness, weakness, numbness and headaches. Negative for syncope and speech difficulty.  Psychiatric/Behavioral: Negative for behavioral problems and confusion.       Positive for depression.    Vital Signs: There were no vitals taken for this visit.  Physical Exam  Constitutional: He is oriented to person, place, and time. He appears well-developed and well-nourished. No distress.  Pulmonary/Chest: Effort normal. No respiratory distress.  Neurological: He is alert and oriented to person, place, and time.  Skin: Skin is warm and dry.  Psychiatric: He has a normal mood and affect. His behavior is normal. Judgment and thought content normal.     Imaging: Ct Head Wo Contrast  Result Date: 12/03/2017 CLINICAL DATA:  47 year old male with a history vertebrobasilar atherosclerosis, status post treatment of left vertebral and basilar disease EXAM: CT HEAD WITHOUT CONTRAST TECHNIQUE:  Contiguous axial images were obtained from the base of the skull through the vertex without intravenous contrast. COMPARISON:  MR 12/01/2017, CT 11/30/2017 FINDINGS: Brain: No acute intracranial hemorrhage. No midline shift or mass effect. Gray-white differentiation maintained. Focal hypodensity in left cerebellum, unchanged from comparison studies. Vascular: New stent of the distal left vertebral artery bridging into the basilar artery. Skull: No acute fracture.  No aggressive bony lesions. Sinuses/Orbits: Partial opacification of ethmoid air cells. Other: None IMPRESSION: No acute intracranial abnormality. Changes of  vertebrobasilar treatment with interval stent placement. Electronically Signed   By: Corrie Mckusick D.O.   On: 12/03/2017 13:34   Ir Intra Cran Stent  Result Date: 12/07/2017 CLINICAL DATA:  Symptomatic severe vertebrobasilar atherosclerotic disease. Abnormal diagnostic catheter arteriogram of 12/01/2017. EXAM: INTRACRANIAL STENT (INCL PTA) COMPARISON:  Diagnostic catheter arteriogram of 12/01/2017, and CT angiogram of the head and neck of 11/30/2017. MEDICATIONS: Heparin 5,000 units IV; Ancef 2 g IV antibiotic was administered within 1 hour of the procedure. ANESTHESIA/SEDATION: General anesthesia CONTRAST:  Isovue 300 approximately 70 cc FLUOROSCOPY TIME:  Fluoroscopy Time: 50 minutes 18 seconds (3336 mGy). COMPLICATIONS: None immediate. TECHNIQUE: Informed written consent was obtained from the patient after a thorough discussion of the procedural risks, benefits and alternatives. All questions were addressed. Maximal Sterile Barrier Technique was utilized including caps, mask, sterile gowns, sterile gloves, sterile drape, hand hygiene and skin antiseptic. A timeout was performed prior to the initiation of the procedure. The right groin was prepped and draped in the usual sterile fashion. Thereafter using modified Seldinger technique, transfemoral access into the right common femoral artery was  obtained without difficulty. Over a 0.035 inch guidewire, a 5 French Pinnacle sheath was inserted. Through this, and also over 0.035 inch guidewire, a 5 [place] JB 1 catheter was advanced to the aortic arch region and selectively positioned left subclavian artery just proximal to the origin of the left vertebral artery. FINDINGS: The left subclavian arteriogram demonstrates mild narrowing of the origin of the left vertebral artery. The vessel is seen to opacify to the cranial skull base. There is slow ascent of contrast of C1-C2. Just proximal to the origin of the left posterior-inferior cerebellar artery there is approximately 50% stenosis. Just distal to this, the vessel tapers into two severe focal areas of narrowing which are pre occlusive. String sign contrast angiographic appearance is noted extending just proximal to bilateral anterior-inferior cerebellar arteries. There is also an approximately 60-70% stenosis of the mid basilar artery just distal to the origin of the anterior-inferior cerebellar arteries. More distally, the basilar artery assumes normal caliber. There is transient opacification of the superior cerebellar arteries. ENDOVASCULAR REVASCULARIZATION OF PRE OCCLUSIVE TANDEM SEVERE STENOSIS OF THE LEFT VERTEBROBASILAR JUNCTION, AND THE PROXIMAL BASILAR ARTERY WITH STENT ASSISTED ANGIOPLASTY. The diagnostic JB 1 catheter in the distal left subclavian artery was changed over a 0.035 inch 300 cm Rosen exchange guidewire for an 80 cm 6 [place] Neuron Max sheath using biplane roadmap technique and constant fluoroscopic guidance. Good aspiration obtained from the hub of the Neuron Max sheath. A gentle control arteriogram performed through the sheath demonstrated no evidence of spasms, dissections or of intraluminal filling defects. The tip of the sheath was then positioned at the mouth of the left vertebral artery. At this time, in a coaxial manner and with constant heparinized saline infusion using  biplane roadmap technique and constant fluoroscopic guidance, a Navien 115 cm guide catheter was advanced over a 0.035 inch Roadrunner guidewire to the distal end of the tip of the Neuron Max guide catheter. The wire was then gently advanced without any difficulty through the vertebral artery to the cranial skull base followed by the Navien guide catheter. The guidewire was removed. Good aspiration obtained from the hub of the Navien guide catheter. A control arteriogram performed centered intracranially defined more clearly the severe pathology in the left vertebrobasilar junction and the proximal basilar artery. Also noted at this time was retrograde opacification of the right vertebrobasilar junction to the right posterior-inferior cerebellar  artery. There was severe stenosis noted at the junction of the right posterior-inferior cerebellar artery with the contralateral vertebrobasilar junction. Measurements were then performed of the vertebrobasilar junction proximal to the proximal tandem severe stenosis and the distal tandem stenosis, and also of the proximal to mid basilar artery. Given the severe degree of vertebrobasilar disease, it was decided to use an under sized balloon angioplasty catheter for percutaneous angioplasty. A 2 mm x 15 mm Gateway balloon angioplasty catheter was then prepped and purged with 50% contrast and 50% heparinized saline infusion. Thereafter, using biplane roadmap technique and constant fluoroscopic guidance in a coaxial manner and with constant heparinized saline infusion, the Gateway 2 x 15 balloon angioplasty microcatheter was then advanced over a 0.014 inch Softip Synchro micro guidewire to the distal end of the guide catheter. With the micro guidewire leading with a J configuration to avoid dissections or inducing spasm, the combination was navigated to the proximal severe stenosis. Using a torque device, and a working micro guidewire gingerly, access was obtained through the  proximal and then the distal severe stenosis and passed the 60-70% stenosis into the distal basilar artery. This was then followed by the balloon angioplasty catheter. The proximal and the distal markers on the balloon were then position appropriately as defined by the angiographic findings. Thereafter, using a micro inflation syringe device via micro tubing, slow control angioplasty was performed of the proximal and of the distal severe pre occlusive stenosis. At the proximal aspect, the balloon was inflated to approximately 4 atmospheres achieving a diameter of approximately 1.95 mm. Here the balloon was maintained for approximately a minute and a half and then retrieved slightly proximally. A control arteriogram performed through the Navien guide catheter demonstrates significantly improved caliber and flow through the proximal angioplastied segment. The balloon was then advanced over the micro guidewire to the more distal severe stenosis. Here a control angioplasty was performed using a micro inflation syringe device via micro tubing with a balloon now being inflated to approximately 4 atmospheres achieving a diameter of approximately 1.97 mm. Once there it was maintained for approximately 1 minute. The balloon was then deflated and retrieved proximally. A control arteriogram performed through the 5 [place] Navien guide catheter demonstrates significantly improved caliber and flow through to the two angioplastied segments and distally. Seen at this time was the second angioplasty. This also covered the 60-70% stenosis of the mid to proximal basilar artery. This now also demonstrates significant improved caliber and flow. There was now copious visualization of the posterior cerebral arteries, the superior cerebellar arteries and the anterior-inferior cerebellar arteries into the capillary and venous phases. At this time, using biplane roadmap technique and constant fluoroscopic guidance, the micro guidewire was  then advanced more distally into the proximal left posterior cerebral artery followed by the balloon. The micro guidewire was removed. The balloon microcatheter was then exchanged for a 300 cm 0.014 inch Softip exchange micro guidewire. The exchange micro guidewire had a J configuration to avoid dissections or inducing spasm. At this time, a Prowler Plus 5 cm microcatheter was then advanced over the exchange micro guidewire to the distal end of the basilar artery. The guidewire was removed. Good aspiration obtained from the hub of the microcatheter. A gentle contrast injection demonstrates safe position of the tip of the microcatheter. This was then connected to continuous heparinized saline infusion. At this time, a 4 mm x 39 mm Enterprise endovascular vascular reconstruct device was advanced to the distal end of the microcatheter. The  entire system was then straightened. The proximal and the distal landing zones of the stent were then defined. The O ring on the delivery microcatheter was loosened. With slight traction with the right hand on the delivery micro guidewire, with the left hand the distal and then the proximal stent were then deployed without difficulty. A control arteriogram performed through the Navien guide catheter in the proximal left vertebrobasilar junction demonstrates significantly improved caliber and flow through the entire vertebrobasilar system and the proximal left vertebrobasilar angioplasty sites. Copious opacification was noted of the posterior cerebral, superior cerebral and the anterior-inferior cerebellar arteries. A tight focal stenosis related to atherosclerotic disease was noted in the proximal right anterior-inferior cerebellar artery. A control arteriogram was then performed at 15, 30 and 40 minutes post placement of the stent. These continued to demonstrate excellent flow through the angioplastied segments of the proximal basilar artery, and the left vertebrobasilar junction.  The patient was given approximately 4.5 mg of super selective intra-arterial Integrilin to ensure no development of intra stent platelet aggregation. None was noted. A final control arteriogram through the left vertebrobasilar junction demonstrated significantly improved caliber and flow through the treated segments of the left vertebrobasilar junction and basilar artery. The degree of patency was close to 60% at the proximal angioplasty stent site, approximately 60-70% at the distal angioplasty stent site, and patency of 70-80% in the mid basilar segment. A final control arteriogram performed through the Navien guide catheter in the left vertebrobasilar junction demonstrated no new evidence of intraluminal filling defects or dissections or extravasation or occlusions. The patient's blood pressure and neurological status remained stable throughout the procedure. His ACT was maintained in the region of approximately 200-220 seconds. The Navien guide catheter was then retrieved and removed as was the Neuron Max 6 [place] sheath which was retrieved into the abdominal aorta and exchanged over a J-tip guidewire for a 6 [place] Pinnacle sheath. This was then connected to continuous heparinized saline infusion. The Pinnacle sheath was then removed with the successful application of a 7 [place] ExoSeal closure device. The right groin appeared soft without evidence of a hematoma or hemorrhage. Distal pulses remained palpable 1+ in the dorsalis pedis, and posterior regions in both feet unchanged from prior to the beginning of the procedure. The patient's general anesthesia was then reversed. The patient was then extubated without difficulty. Upon recovery, neurologically, the patient remained intact without lateralizing features. He, however, did complain of a 9 out of 10 headache. This prompted a CT scan of the brain which revealed no evidence of a hemorrhage. The patient was then transferred to the PACU to continue on IV  heparin and close neurologic observations. He was transferred to the neuro ICU to continue with the above management. IMPRESSION: Status post balloon angioplasty followed by stent assistance of pre occlusive severe tandem stenosis of the left vertebrobasilar junction distal to the left posterior-inferior cerebellar artery, and the proximal basilar artery as described above. Patency post angioplasty and stenting 50-60% proximally to 60-70% distally. PLAN: The patient continued to improve in terms of his headaches over the next 24 hours. His IV heparin was stopped. He was switched to aspirin 81 mg a day, and Brilinta b.i.d. The patient remained neurologically intact. He will be seen in follow-up in the clinic approximately 4 weeks post discharge. Electronically Signed   By: Luanne Bras M.D.   On: 12/06/2017 10:41    Labs:  CBC: Recent Labs    11/30/17 1928 11/30/17 2043 12/01/17 0248 12/03/17  2458 12/04/17 0650  WBC 10.7*  --  10.3 8.4 9.9  HGB 13.5 16.3 12.5* 11.8* 11.8*  HCT 40.8 48.0 39.0 37.1* 37.0*  PLT 296  --  284 250 269    COAGS: Recent Labs    11/30/17 1928  INR 1.02  APTT 35    BMP: Recent Labs    11/16/17 0239 11/30/17 1928 11/30/17 2043 12/01/17 0248 12/03/17 0610 12/04/17 0650  NA 135 140 140  --  139 138  K 3.8 3.9 3.8  --  3.7 3.7  CL 101 106 106  --  109 102  CO2 25 23  --   --  24 24  GLUCOSE 176* 127* 117*  --  153* 147*  BUN 13 22* 24*  --  13 9  CALCIUM 9.3 9.9  --   --  8.7* 9.1  CREATININE 1.27* 1.36* 1.30* 1.18 1.02 0.95  GFRNONAA >60 >60  --  >60 >60 >60  GFRAA >60 >60  --  >60 >60 >60    LIVER FUNCTION TESTS: No results for input(s): BILITOT, AST, ALT, ALKPHOS, PROT, ALBUMIN in the last 8760 hours.  TUMOR MARKERS: No results for input(s): AFPTM, CEA, CA199, CHROMGRNA in the last 8760 hours.  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 [name]. Reviewed imaging with patient and family. Brought to their attention was patient's left VBJ/promial basilar artery stenosis, both prior to and following endovascular intervention. Explained that the best course of management for patient at this time is monitoring for changes with routine imaging scans.  Discussed patient's symptoms. Explained that fatigue is common following a stroke, and informed patient it might take up to 6 months for his fatigue to subside. From the standpoint of his stent, he is doing much better, as he is now able to walk/talk. Strongly advised patient to follow-up with his neurologist, Dr. Erlinda Hong, to discuss his new symptoms.  Discussed patient's diabetes mellitus. Patient's sister states that his blood sugars are running in the 120s-130s every day. Strongly advised patient to check his blood sugars regularly.  Discussed patient's tobacco abuse. Patient states that he is trying to quit. States that he was smoking 2 PPD, and is now down to 6-8 cigarettes per day. Strongly advised patient to completely quit smoking immediately.  Discussed patient's Brilinta use. Patient states that he has been having trouble getting his medication because he cannot afford it. States he missed 3 days of his medicine last week because he ran out, but luckily his cardiologist gave him a months sample. Informed patient that if he cannot afford his Brilinta that we can switch him to Plavix (as it is cheaper), however it is less effective. Patient states he would like to continue on Brilinta. One months sample of Brilinta given to patient.  Plan for follow-up CTA head/neck (with contrast) 4 months from procedure 12/03/2017. Informed patient that our schedulers will call him to set up this imaging scan. Instructed patient to continue taking Brilinta 90 mg twice daily and Aspirin 81 mg once daily. Instructed patient to stay hydrated by drinking plenty of water.  All questions  answered and concerns addressed. Patient conveys understanding and agrees with plan.  Thank you for this interesting consult.  I greatly enjoyed meeting Devin [name] and look forward to participating in their care.  A copy of this report was sent to the requesting provider on this date.  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

## 2018-01-03 NOTE — Progress Notes (Signed)
[facility] [address] [place] [address] [phone]       OFFICE FOLLOW UP NOTE  Mr. HANSFORD HIRT Date of Birth:  04-Apr-1970 Medical Record Number:  [id number]   Reason for Referral:  hospital stroke follow up  CHIEF COMPLAINT:  Chief Complaint  Patient presents with  . Follow-up    Follow up for Stroke hospital room in back hallway pt with Tye [place] sister    HPI: KENZEL RUESCH is being seen today for initial visit in the office for TIA likely due to large vessel stenosis of BA and VA s/p stenting on 11/30/2017. History obtained from patient, sister and chart review. Reviewed all radiology images and labs personally.  Mr. ABRIEL GEESEY is a 47 y.o. male with history of coronary artery disease (recent stent),EF 22 to 40% withCHF,hyperlipidemia, hypertension, hypothyroidism, tobacco abuse, diabetes mellitus,rhabdomyolysis in the past well on Crestor,dyspnea and asthma  who presented to [facility], ED with Lt arm numbness and chest pain. He did not receive IV t-PA due to resolution of deficits.  CT head reviewed and showed old appearing left cerebellar infarct with suspected small right cerebellar infarct.  MRI brain reviewed and showed small chronic left cerebellar infarct.  CTA head and neck showed segmental occlusion basilar artery of bilateral vertebral arteries.  DSA showed occluded right VBJ just distal to right PICA, severe high-grade stenosis of right VA origin, severe preocclusive tendon stenosis of left VBJ and proximal basilar artery.  He was then transferred to [facility] for further evaluation and management.  Patient underwent stent placement of left VBJ and BA on 12/03/2017.  2D echo showed an EF of 35 to 40%.  It was recommended to continue DAPT with aspirin 81 mg and Brilinta 90 mg twice daily due to cerebral and cardiac stents.  LDL 201 and recommended starting Zetia 10 mg daily as he has a statin intolerance with rhabdo and recommended consideration  of PCSK9 inhibitors during cardiology follow-up appointment.  A1c 8.5 and recommended tight glycemic control with close PCP follow-up for DM management.  HTN stable during admission and recommended long-term BP goal 1 20-1 60.  Current tobacco use and smoking cessation counseling was provided.  Patient was discharged home in stable condition with recommendations of outpatient PT.  Patient is being seen today for hospital follow-up and is accompanied by his sister.  He has multiple complaints today with the greatest being daily headaches and dizzy spells.  He also continues to have residual deficit of right-sided numbness and weakness which she states was present since inpatient.  He has not been able to participate in any outpatient therapies due to lack of insurance but he is currently Medicaid pending.  He states he has been having headaches daily for the past 2 years.  He was evaluated at [facility] where he did not get any answers.  He states these headaches are daily and will typically awaken with them and sometimes can last all day.  He rates pain level 5/10 on a daily basis that typically will start on the right side of his neck and radiate up through his ear along with radiating up to the top of his head and into his frontal area and right eye.  He describes this as a throbbing/stabbing sensation and is associated with photophobia and phonophobia.  At times, he can have worsening of his headaches where he needs to lay down in a dark, quiet room where he can experience tunnel vision and  will increase dizziness sensation.  He has tried to take Advil in the past but this did not help along with being told during hospital admission to avoid use of Advil.  He denies any type of other OTC use.  He denies any other type of medical treatment for his headaches in the past.  He also continues to have dizzy spells that occur every other day and has been present since his hospital admission.  His dizziness  sensation can last from 1 to 1.5 minutes and are not worsened with position changes or rapid head movement.  He describes this as room spinning and difficulty with his balance where he will have to hold onto the counter or some type of object to study him.  He denies any improvement.  He does have a history of bilateral lower extremity numbness/tingling which is most likely due to diabetic neuropathy which started approximately 2 years ago but denies any past treatment or evaluation.  He does endorse burning sensation in his bilateral lower extremities that does worsen at night or with increased ambulation.  He does have symptoms of excessive daytime fatigue and snoring but has never been worked up for OSA in the past.  He does have a family history of OSA.  He has also been experiencing depression and anxiety symptoms since his hospitalization.  He has been having a difficult time adjusting as he has been unable to work and is currently not having any type of income to assist with pain monthly bills, medical bills, and prescribed medications.  He is frustrated with his continued headaches along with his continued dizziness sensation is making it difficult for him to perform activities and stay active during the day.  Denies any suicidal ideation or plan.  He denies any depression or anxiety symptoms in the past.  He is not currently working and is currently applying for disability.  He continues to smoke cigarettes but has been able to decrease from 2 packs/day to currently 8 cigarettes/day and continues to decrease amount until he completely quits.  He was followed by Dr. [name] and was recommended to repeat CTA in January as recent imagings showed patent stent. He does endorse compliance with current medication regimen such as continuing aspirin 81 mg and Brilinta without side effects of bleeding or bruising along with continuation of Zetia.  Blood pressure today satisfactory 142/98.  He does not monitor this  at home as he does not have a cuff at home but sister states that he is able to use hers for the time being. No further concerns at this time.  Denies new or worsening stroke/TIA symptoms.     ROS:   14 system review of systems performed and negative with exception of fatigue, hearing loss, ringing in ears, spinning sensation, moles, double vision, eye pain, shortness of breath, feeling hot, feeling cold, runny nose, memory loss, confusion, headache, numbness, weakness, dizziness, depression, anxiety, decreased energy, sleepiness and snoring  PMH:  Past Medical History:  Diagnosis Date  . Asthma   . Chest pain   . Coronary artery disease   . Dyspnea   . GERD (gastroesophageal reflux disease)   . History of kidney stones   . History of rhabdomyolysis   . Hyperlipidemia   . Hypertension   . Hypothyroidism   . Kidney calculus 2014  . Pericardial effusion    Small, by dobutamine echocardiogram, 04/2006  . Stroke ([place])   . Tobacco abuse   . Type 2 diabetes mellitus without  complications ([place]) 5/92/9244  . Vitamin D deficiency     PSH:  Past Surgical History:  Procedure Laterality Date  . 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 CHOLECYSTECTOMY N/A 01/27/2013   Procedure: LAPAROSCOPIC CHOLECYSTECTOMY;  Surgeon: Jamesetta So, MD;  Location: [facility];  Service: General;  Laterality: N/A;  . CORONARY STENT INTERVENTION N/A 11/15/2017   Procedure: CORONARY STENT INTERVENTION;  Surgeon: Leonie Man, MD;  Location: [place] [facility];  Service: Cardiovascular;  Laterality: N/A;  . HERNIA REPAIR     As a child  . INSERTION OF MESH N/A 11/13/2012   Procedure: INSERTION OF MESH;  Surgeon: Jamesetta So, MD;  Location: [facility];  Service: General;  Laterality: N/A;  . IR ANGIO INTRA EXTRACRAN SEL COM CAROTID INNOMINATE BILAT MOD SED  12/01/2017  . IR ANGIO VERTEBRAL SEL VERTEBRAL BILAT MOD SED  12/01/2017    . IR INTRA CRAN STENT  12/03/2017  . KIDNEY STONE SURGERY    . LEFT HEART CATH AND CORONARY ANGIOGRAPHY N/A 11/15/2017   Procedure: LEFT HEART CATH AND CORONARY ANGIOGRAPHY;  Surgeon: Leonie Man, MD;  Location: [place];  Service: Cardiovascular;  Laterality: N/A;  . PERCUTANEOUS NEPHROLITHOTRIPSY    . RADIOLOGY WITH ANESTHESIA Left 12/03/2017   Procedure: Angioplasty with possible stenting of left VBJ;  Surgeon: Luanne Bras, MD;  Location: [facility];  Service: Radiology;  Laterality: Left;  . UMBILICAL HERNIA REPAIR N/A 11/13/2012   Procedure: UMBILICAL HERNIORRHAPHY;  Surgeon: Jamesetta So, MD;  Location: [facility];  Service: General;  Laterality: N/A;  . VARICOCELECTOMY      Social History:  Social History   Socioeconomic History  . Marital status: Divorced    Spouse name: Not on file  . Number of children: Not on file  . Years of education: Not on file  . Highest education level: Not on file  Occupational History    Comment:      Employer: [employer]  Social Needs  . Financial resource strain: Not on file  . Food insecurity:    Worry: Not on file    Inability: Not on file  . Transportation needs:    Medical: Not on file    Non-medical: Not on file  Tobacco Use  . Smoking status: Current Some Day Smoker    Packs/day: 0.25    Years: 26.00    Pack years: 6.50    Types: Cigarettes    Start date: 03/13/1985  . Smokeless tobacco: Former Systems developer    Types: Snuff, Chew    Quit date: 03/14/1987  . Tobacco comment: 8 PER DAY  Substance and Sexual Activity  . Alcohol use: No  . Drug use: No  . Sexual activity: Not on file  Lifestyle  . Physical activity:    Days per week: Not on file    Minutes per session: Not on file  . Stress: Not on file  Relationships  . Social connections:    Talks on phone: Not on file    Gets together: Not on file    Attends religious service: Not on file    Active member of club or organization: Not on file    Attends meetings of  clubs or organizations: Not on file    Relationship status: Not on file  . Intimate partner violence:    Fear of current or ex partner: Not on file  Emotionally abused: Not on file    Physically abused: Not on file    Forced sexual activity: Not on file  Other Topics Concern  . Not on file  Social History Narrative   Eats fast food daily   No exercise outside of work   Lives in [place] with his dog   MAINTENANCE TECHNICIAN    Family History:  Family History  Problem Relation Age of Onset  . Coronary artery disease Father   . Hypertension Father   . Hyperlipidemia Father   . Diabetes Father   . Congestive Heart Failure Father 17  . Arrhythmia Father        had an ICD  . Diabetes Mother   . Hypertension Mother   . Hyperlipidemia Mother   . Obesity Mother 6       died after bariatric surgery, liver failure and infection  . Coronary artery disease Maternal Grandfather        both grandfathers and several uncles  . Coronary artery disease Unknown   . Diabetes Sister        both sisters  . Stroke Maternal Aunt   . Stroke Maternal Uncle     Medications:   Current Outpatient Medications on File Prior to Visit  Medication Sig Dispense Refill  . aspirin 81 MG EC tablet Take 1 tablet (81 mg total) by mouth daily. With food 30 tablet 0  . Blood Glucose Monitoring Suppl w/Device KIT Dispense based on patient and insurance preference. Check blood sugar daily before breakfast (ICD9 250.0) 1 each 0  . ezetimibe (ZETIA) 10 MG tablet Take 1 tablet (10 mg total) by mouth daily. 30 tablet 3  . glimepiride (AMARYL) 2 MG tablet Take 1 tablet (2 mg total) by mouth daily with breakfast. 30 tablet 0  . levothyroxine (SYNTHROID, LEVOTHROID) 125 MCG tablet Take 1 tablet (125 mcg total) by mouth daily before breakfast. 30 tablet 0  . nitroGLYCERIN (NITROSTAT) 0.4 MG SL tablet Place 0.4 mg under the tongue every 5 (five) minutes as needed for chest pain.    . pantoprazole (PROTONIX) 20 MG tablet  Take 1 tablet (20 mg total) by mouth daily. 30 tablet 0  . ticagrelor (BRILINTA) 90 MG TABS tablet Take 1 tablet (90 mg total) by mouth 2 (two) times daily. 60 tablet 0  . sacubitril-valsartan (ENTRESTO) 24-26 MG Take 1 tablet by mouth 2 (two) times daily. (Patient not taking: Reported on 01/04/2018) 60 tablet 3   No current facility-administered medications on file prior to visit.     Allergies:   Allergies  Allergen Reactions  . Bee Venom Swelling  . Crestor [Rosuvastatin] Other (See Comments)    rhabdomyolosis     Physical Exam  Vitals:   01/04/18 0828  BP: (!) 142/98  Pulse: 79  Weight: 279 lb 6.4 oz (126.7 kg)  Height: _0  (1.981 m)   Body mass index is 32.29 kg/m. No exam data present  General: Obese, pleasant middle-aged Caucasian male, seated, in no evident distress Head: head normocephalic and atraumatic.   Neck: supple with no carotid or supraclavicular bruits Cardiovascular: regular rate and rhythm, no murmurs Musculoskeletal: no deformity Skin:  no rash/petichiae Vascular:  Normal pulses all extremities  Neurologic Exam Mental Status: Awake and fully alert. Oriented to place and time. Recent and remote memory intact. Attention span, concentration and fund of knowledge appropriate. Mood and affect appropriate.  Cranial Nerves: Fundoscopic exam reveals sharp disc margins. Pupils equal, briskly reactive to light. Extraocular movements  full without nystagmus. Visual fields full to confrontation. Hearing intact. Facial sensation intact. Face, tongue, palate moves normally and symmetrically.  Motor: Normal bulk and tone. Normal strength in all tested extremity muscles except for mild left hemiparesis but difficult to examine due to giveaway weakness during exam Sensory.:  Decreased sensation in left upper and lower extremity along with decreased sensation bilateral lower extremity.  Coordination: Rapid alternating movements normal in all extremities. Finger-to-nose  and heel-to-shin performed accurately bilaterally.  No evidence of orbiting upper extremities. Gait and Station: Arises from chair without difficulty. Stance is normal. Gait demonstrates normal stride length and balance without need of assistive device. Able to heel, toe and tandem walk with mild difficulty.  Romberg positive. Reflexes: 1+ and symmetric. Toes downgoing.    NIHSS  0 Modified Rankin  2    Diagnostic Data (Labs, Imaging, Testing)  Ct Angio Head W Or Wo Contrast Ct Angio Neck W And/or Wo Contrast 11/30/2017 IMPRESSION:  CT HEAD:  1. 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 Suspected small RIGHT cerebellar infarct.  2. Otherwise negative  CTA NECK:  1. No acute vascular process.  2. 50% stenosis LEFT ICA.  Patent vertebral arteries.  CTA HEAD:  1. Limited by venous phase.  2. 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 Attenuated bilateral PCA (potentially obscured by venous structures). Recommend neuro interventional consultation.    Mr Brain Wo Contrast 12/01/2017 IMPRESSION:  1. No acute intracranial abnormality. Small chronic left cerebellar infarct.  2. Evidence of substantial basilar artery atherosclerosis, concordant with the high-grade vertebrobasilar stenosis demonstrated by CTA yesterday.    Ct Angio Chest/abd/pel For Dissection W And/or Wo Contrast 11/30/2017 IMPRESSION:  CTA FINDINGS  1. No dissection of the thoracoabdominal aorta.  No aneurysm.  2. Minor atherosclerosis of the thoracic aorta. Mild atherosclerosis of the abdominal aorta. No significant stenosis any of the aortic branch vessels.  3. Left coronary artery calcifications.  NON CTA FINDINGS  1. No acute findings within the chest, abdomen or pelvis.  2. Mild upper lobe paraseptal emphysema.   Cerebral Angiogram - Dr [name] 12/01/2017 Findings. 1.Occluded RT VBJ just distal to RT PICA. 2.Severe high grade  stenosi of RT VA origin. 3.Severe preocclusive tandem stenosis of Lt VBJ, and of prox basilar artery. 7.25 % stenosis of LICA prox.  Transthoracic Echocardiogram - Left ventricle: The cavity size was mildly dilated. There was mild concentric hypertrophy. Systolic function was moderately reduced. The estimated ejection fraction was in the range of 35% to 40%. Diffuse hypokinesis. Features are consistent with a pseudonormal left ventricular filling pattern, with concomitant abnormal relaxation and increased filling pressure (grade 2 diastolic dysfunction). Doppler parameters are consistent with high ventricular filling pressure. - Regional wall motion abnormality: Moderate hypokinesis of the mid anterior, basal inferior, mid inferolateral, and mid anterolateral myocardium. - Mitral valve: Mildly calcified annulus. - Atrial septum: No defect or patent foramen ovale was identified.  DSA - S/P LT VBJ and prox basilar artery angioplasty with stent assistence for symptomatic severe stenosis.    ASSESSMENT: Devin [name] is a 47 y.o. year old male here with TIA on 11/30/17 secondary to large vessel stenosis of BA and VA s/p stenting. Vascular risk factors include CAD (recent stent), EF 35-40% with CHF, HLD, HTN, DM and tobacco use.  Patient is being seen today for hospital follow-up and does have continued complaints of left-sided weakness, numbness, dizzy sensation and daily headaches.  Headaches can be contributed to migraines versus OSA  versus stress versus occipital neuralgia.    PLAN: -Continue aspirin 81 mg daily and Brilinta  and Zetia 10 mg for secondary stroke prevention -F/u with PCP regarding your HLD, HTN and DM management -Headaches -recommended to undergo OSA work-up and referral was placed with a [facility] sleep clinic (will have to wait until Medicaid approved to undergo sleep study).  Advised patient if he is diagnosed with OSA with CPAP management and he  continues to experience daily headaches, he can be referred at that time to Dr. Jaynee Eagles for migraine management versus possible nerve blocks for occipital neuralgia.  -Diabetic neuropathy -we will start gabapentin 300 mg twice daily and advised patient to call if tolerating well he continues to experience neuropathy pains and we will consider increasing.  Patient was also advised that this may help relieve some of his headache pain but is not a guarantee.  No reason to undergo EMG/NCV at this time as his neuropathy is most likely due to diabetes and has been stable -Start Celexa 10 mg daily for situational depression along with stress relaxation techniques as this could be contributing to some of his complaints -f/u with Dr. [name] in January for repeat imaging -Highly encouraged continuation of decreasing amount of cigarettes per day until he completely quits -Recommended to undergo outpatient PT but due to Medicaid pending, this will be on hold until he has insurance approval.  Patient was provided with dizziness exercises that he can do at home -Recommended to start monitoring BP at home especially with worsening of headaches and to ensure adequate management of BP -advised to continue to stay active and maintain a healthy diet -Maintain strict control of hypertension with blood pressure goal below 130/90, diabetes with hemoglobin A1c goal below 6.5% and cholesterol with LDL cholesterol (bad cholesterol) goal below 70 mg/dL. I also advised the patient to eat a healthy diet with plenty of whole grains, cereals, fruits and vegetables, exercise regularly and maintain ideal body weight.  Follow up in 6 months or call earlier if needed   Greater than 50% of time during this 25 minute visit was spent on counseling,explanation of diagnosis of TIA with vertebral stenosis with stent placement, reviewing risk factor management of vertebral stent placement, HLD, HTN, DM, diabetic neuropathy, daily headaches  related to migraine vs occipital neuralgia, reactive depression/anxiety and possible OSA, planning of further management, discussion with patient and family and coordination of care    Venancio Poisson, AGNP-BC  [facility] [address] [place], [place] [address]  Phone [phone] Fax [phone] Note: This document was prepared with digital dictation and possible smart phrase technology. Any transcriptional errors that result from this process are unintentional.

## 2018-01-04 ENCOUNTER — Ambulatory Visit: Payer: Self-pay | Admitting: Adult Health

## 2018-01-04 ENCOUNTER — Ambulatory Visit: Payer: MEDICAID | Admitting: Adult Health

## 2018-01-04 ENCOUNTER — Encounter: Payer: Self-pay | Admitting: Adult Health

## 2018-01-04 ENCOUNTER — Telehealth: Payer: Self-pay | Admitting: Cardiology

## 2018-01-04 VITALS — BP 142/98 | HR 79 | Ht 78.0 in | Wt 279.4 lb

## 2018-01-04 DIAGNOSIS — I651 Occlusion and stenosis of basilar artery: Secondary | ICD-10-CM

## 2018-01-04 DIAGNOSIS — I10 Essential (primary) hypertension: Secondary | ICD-10-CM

## 2018-01-04 DIAGNOSIS — E785 Hyperlipidemia, unspecified: Secondary | ICD-10-CM

## 2018-01-04 DIAGNOSIS — G459 Transient cerebral ischemic attack, unspecified: Secondary | ICD-10-CM

## 2018-01-04 DIAGNOSIS — G4733 Obstructive sleep apnea (adult) (pediatric): Secondary | ICD-10-CM

## 2018-01-04 DIAGNOSIS — R42 Dizziness and giddiness: Secondary | ICD-10-CM

## 2018-01-04 MED ORDER — CITALOPRAM HYDROBROMIDE 10 MG PO TABS: 10.0000 mg | ORAL_TABLET | Freq: Every day | ORAL | 3 refills | Status: DC

## 2018-01-04 MED ORDER — GABAPENTIN 300 MG PO CAPS: 300.0000 mg | ORAL_CAPSULE | Freq: Two times a day (BID) | ORAL | 4 refills | Status: DC

## 2018-01-04 NOTE — Patient Instructions (Addendum)
Continue aspirin 81 mg daily and Brilinta  and Zetia  for secondary stroke prevention  Continue to follow up with PCP regarding cholesterol, blood pressure and diabetes management   Start gabapentin 300mg  twice a day for headaches. If you are found to have sleep apnea, we will see if treatment helps with headaches. If you do not have sleep apnea, I will refer you to see Dr. Lucia Gaskins who is our headache specialist. \  Start Celexa 10mg  daily for depression - we can consider increasing after 4-6 weeks  Follow up with Dr. Corliss Skains in January for repeat imaging   Do dizziness exercises and once you have insurance approved, start therapy for continued dizziness - I will send you exercises via MyChart   You will be called to schedule sleep apnea testing to rule out sleep apnea  Continue to stay active as tolerated and maintain a healthy diet  Continue to monitor blood pressure at home  Maintain strict control of hypertension with blood pressure goal below 130/90, diabetes with hemoglobin A1c goal below 6.5% and cholesterol with LDL cholesterol (bad cholesterol) goal below 70 mg/dL. I also advised the patient to eat a healthy diet with plenty of whole grains, cereals, fruits and vegetables, exercise regularly and maintain ideal body weight.  Followup in the future with me in 6 months or call earlier if needed       Thank you for coming to see [name] at [facility]. I hope we have been able to provide you high quality care today.  You may receive a patient satisfaction survey over the next few weeks. We would appreciate your feedback and comments so that we may continue to improve ourselves and the health of our patients.

## 2018-01-04 NOTE — Telephone Encounter (Signed)
Please give pt's sister Lynden Ang a call @ [phone] concerning the pt's Sherryll Burger - he was given a card and he's already used one at his pharmacy. He does not have any ins and cannot afford it.

## 2018-01-04 NOTE — Telephone Encounter (Signed)
Sister cathy will pick up [employer] assistance form on Monday 10/28 and I have one bottle, 14 day supply entresto 24/26 mg lot [id number], exp 2021

## 2018-01-04 NOTE — Progress Notes (Signed)
I agree with the above plan 

## 2018-01-05 ENCOUNTER — Emergency Department (HOSPITAL_COMMUNITY): Payer: Self-pay

## 2018-01-05 ENCOUNTER — Other Ambulatory Visit: Payer: Self-pay

## 2018-01-05 ENCOUNTER — Observation Stay (HOSPITAL_COMMUNITY): Payer: Self-pay

## 2018-01-05 ENCOUNTER — Inpatient Hospital Stay (HOSPITAL_COMMUNITY)
Admission: EM | Admit: 2018-01-05 | Discharge: 2018-01-08 | DRG: 092 | Disposition: A | Discharge status: Home / Self Care | Payer: Self-pay | Attending: Internal Medicine | Admitting: Internal Medicine

## 2018-01-05 ENCOUNTER — Encounter (HOSPITAL_COMMUNITY): Payer: Self-pay | Admitting: Emergency Medicine

## 2018-01-05 DIAGNOSIS — I5042 Chronic combined systolic (congestive) and diastolic (congestive) heart failure: Secondary | ICD-10-CM

## 2018-01-05 DIAGNOSIS — E039 Hypothyroidism, unspecified: Secondary | ICD-10-CM | POA: Diagnosis present

## 2018-01-05 DIAGNOSIS — E1159 Type 2 diabetes mellitus with other circulatory complications: Secondary | ICD-10-CM

## 2018-01-05 DIAGNOSIS — R251 Tremor, unspecified: Principal | ICD-10-CM | POA: Diagnosis present

## 2018-01-05 DIAGNOSIS — E785 Hyperlipidemia, unspecified: Secondary | ICD-10-CM | POA: Diagnosis present

## 2018-01-05 DIAGNOSIS — E119 Type 2 diabetes mellitus without complications: Secondary | ICD-10-CM

## 2018-01-05 DIAGNOSIS — Z8673 Personal history of transient ischemic attack (TIA), and cerebral infarction without residual deficits: Secondary | ICD-10-CM | POA: Insufficient documentation

## 2018-01-05 DIAGNOSIS — Z888 Allergy status to other drugs, medicaments and biological substances status: Secondary | ICD-10-CM

## 2018-01-05 DIAGNOSIS — K219 Gastro-esophageal reflux disease without esophagitis: Secondary | ICD-10-CM | POA: Diagnosis present

## 2018-01-05 DIAGNOSIS — F329 Major depressive disorder, single episode, unspecified: Secondary | ICD-10-CM | POA: Diagnosis present

## 2018-01-05 DIAGNOSIS — E1142 Type 2 diabetes mellitus with diabetic polyneuropathy: Secondary | ICD-10-CM | POA: Diagnosis present

## 2018-01-05 DIAGNOSIS — F17200 Nicotine dependence, unspecified, uncomplicated: Secondary | ICD-10-CM | POA: Diagnosis present

## 2018-01-05 DIAGNOSIS — I2510 Atherosclerotic heart disease of native coronary artery without angina pectoris: Secondary | ICD-10-CM | POA: Diagnosis present

## 2018-01-05 DIAGNOSIS — F17210 Nicotine dependence, cigarettes, uncomplicated: Secondary | ICD-10-CM | POA: Diagnosis present

## 2018-01-05 DIAGNOSIS — R4182 Altered mental status, unspecified: Secondary | ICD-10-CM | POA: Diagnosis present

## 2018-01-05 DIAGNOSIS — E782 Mixed hyperlipidemia: Secondary | ICD-10-CM | POA: Diagnosis present

## 2018-01-05 DIAGNOSIS — Z7984 Long term (current) use of oral hypoglycemic drugs: Secondary | ICD-10-CM

## 2018-01-05 DIAGNOSIS — E781 Pure hyperglyceridemia: Secondary | ICD-10-CM | POA: Diagnosis present

## 2018-01-05 DIAGNOSIS — I10 Essential (primary) hypertension: Secondary | ICD-10-CM | POA: Diagnosis present

## 2018-01-05 DIAGNOSIS — Z7982 Long term (current) use of aspirin: Secondary | ICD-10-CM

## 2018-01-05 DIAGNOSIS — R569 Unspecified convulsions: Secondary | ICD-10-CM

## 2018-01-05 DIAGNOSIS — Z79899 Other long term (current) drug therapy: Secondary | ICD-10-CM

## 2018-01-05 DIAGNOSIS — Z720 Tobacco use: Secondary | ICD-10-CM | POA: Diagnosis present

## 2018-01-05 DIAGNOSIS — E1165 Type 2 diabetes mellitus with hyperglycemia: Secondary | ICD-10-CM

## 2018-01-05 DIAGNOSIS — Z955 Presence of coronary angioplasty implant and graft: Secondary | ICD-10-CM

## 2018-01-05 DIAGNOSIS — I110 Hypertensive heart disease with heart failure: Secondary | ICD-10-CM | POA: Diagnosis present

## 2018-01-05 LAB — DIFFERENTIAL
Abs Immature Granulocytes: 0.06 10*3/uL (ref 0.00–0.07)
BASOS ABS: 0.1 10*3/uL (ref 0.0–0.1)
BASOS PCT: 1 %
EOS ABS: 0.4 10*3/uL (ref 0.0–0.5)
Eosinophils Relative: 3 %
IMMATURE GRANULOCYTES: 0 %
LYMPHS PCT: 27 %
Lymphs Abs: 4.1 10*3/uL — ABNORMAL HIGH (ref 0.7–4.0)
Monocytes Absolute: 0.8 10*3/uL (ref 0.1–1.0)
Monocytes Relative: 5 %
NEUTROS PCT: 64 %
Neutro Abs: 10.1 10*3/uL — ABNORMAL HIGH (ref 1.7–7.7)

## 2018-01-05 LAB — RAPID URINE DRUG SCREEN, HOSP PERFORMED
AMPHETAMINES: NOT DETECTED
Barbiturates: NOT DETECTED
Benzodiazepines: NOT DETECTED
Cocaine: NOT DETECTED
OPIATES: NOT DETECTED
TETRAHYDROCANNABINOL: NOT DETECTED

## 2018-01-05 LAB — COMPREHENSIVE METABOLIC PANEL
ALBUMIN: 4.3 g/dL (ref 3.5–5.0)
ALK PHOS: 60 U/L (ref 38–126)
ALT: 19 U/L (ref 0–44)
ANION GAP: 11 (ref 5–15)
AST: 22 U/L (ref 15–41)
BUN: 16 mg/dL (ref 6–20)
CHLORIDE: 102 mmol/L (ref 98–111)
CO2: 24 mmol/L (ref 22–32)
Calcium: 9.5 mg/dL (ref 8.9–10.3)
Creatinine, Ser: 1.19 mg/dL (ref 0.61–1.24)
GFR calc non Af Amer: >60 mL/min (ref >60)
Glucose, Bld: 256 mg/dL — ABNORMAL HIGH (ref 70–99)
POTASSIUM: 4.1 mmol/L (ref 3.5–5.1)
SODIUM: 137 mmol/L (ref 135–145)
Total Bilirubin: 0.6 mg/dL (ref 0.3–1.2)
Total Protein: 7.6 g/dL (ref 6.5–8.1)

## 2018-01-05 LAB — CBC
HCT: 43.8 % (ref 39.0–52.0)
HEMOGLOBIN: 13.7 g/dL (ref 13.0–17.0)
MCH: 28.6 pg (ref 26.0–34.0)
MCHC: 31.3 g/dL (ref 30.0–36.0)
MCV: 91.4 fL (ref 80.0–100.0)
NRBC: 0 % (ref 0.0–0.2)
Platelets: 308 10*3/uL (ref 150–400)
RBC: 4.79 MIL/uL (ref 4.22–5.81)
RDW: 14.6 % (ref 11.5–15.5)
WBC: 15.5 10*3/uL — AB (ref 4.0–10.5)

## 2018-01-05 LAB — I-STAT CHEM 8, ED
BUN: 20 mg/dL (ref 6–20)
CALCIUM ION: 1.16 mmol/L (ref 1.15–1.40)
CHLORIDE: 103 mmol/L (ref 98–111)
Creatinine, Ser: 1.2 mg/dL (ref 0.61–1.24)
GLUCOSE: 259 mg/dL — AB (ref 70–99)
HCT: 42 % (ref 39.0–52.0)
Hemoglobin: 14.3 g/dL (ref 13.0–17.0)
POTASSIUM: 4.3 mmol/L (ref 3.5–5.1)
Sodium: 138 mmol/L (ref 135–145)
TCO2: 25 mmol/L (ref 22–32)

## 2018-01-05 LAB — URINALYSIS, ROUTINE W REFLEX MICROSCOPIC
BILIRUBIN URINE: NEGATIVE
Glucose, UA: NEGATIVE mg/dL
HGB URINE DIPSTICK: NEGATIVE
KETONES UR: NEGATIVE mg/dL
Leukocytes, UA: NEGATIVE
NITRITE: NEGATIVE
Protein, ur: NEGATIVE mg/dL
Specific Gravity, Urine: >1.046 — ABNORMAL HIGH (ref 1.005–1.030)
pH: 6 (ref 5.0–8.0)

## 2018-01-05 LAB — APTT: APTT: 28 s (ref 24–36)

## 2018-01-05 LAB — PROTIME-INR
INR: 0.93
PROTHROMBIN TIME: 12.4 s (ref 11.4–15.2)

## 2018-01-05 LAB — I-STAT TROPONIN, ED: Troponin i, poc: 0 ng/mL (ref 0.00–0.08)

## 2018-01-05 LAB — TSH: TSH: 60.657 u[IU]/mL — ABNORMAL HIGH (ref 0.350–4.500)

## 2018-01-05 LAB — ETHANOL: Alcohol, Ethyl (B): <10 mg/dL (ref <10)

## 2018-01-05 LAB — CBG MONITORING, ED
GLUCOSE-CAPILLARY: 271 mg/dL — AB (ref 70–99)
GLUCOSE-CAPILLARY: 151 mg/dL — AB (ref 70–99)

## 2018-01-05 MED ORDER — INSULIN ASPART 100 UNIT/ML ~~LOC~~ SOLN
0.0000 [IU] | Freq: Three times a day (TID) | SUBCUTANEOUS | Status: DC
  Administered 2018-01-06: 2 [IU] via SUBCUTANEOUS
  Administered 2018-01-06: 3 [IU] via SUBCUTANEOUS
  Administered 2018-01-06: 1 [IU] via SUBCUTANEOUS
  Administered 2018-01-07: 2 [IU] via SUBCUTANEOUS
  Administered 2018-01-07: 1 [IU] via SUBCUTANEOUS
  Administered 2018-01-08: 2 [IU] via SUBCUTANEOUS
  Administered 2018-01-08: 1 [IU] via SUBCUTANEOUS
  Filled 2018-01-05 (×2): qty 1

## 2018-01-05 MED ORDER — ASPIRIN 300 MG RE SUPP
300.0000 mg | Freq: Every day | RECTAL | Status: DC
  Filled 2018-01-05 (×2): qty 1

## 2018-01-05 MED ORDER — METOCLOPRAMIDE HCL 5 MG/ML IJ SOLN
10.0000 mg | Freq: Once | INTRAMUSCULAR | Status: AC
  Administered 2018-01-05: 10 mg via INTRAVENOUS
  Filled 2018-01-05: qty 2

## 2018-01-05 MED ORDER — ACETAMINOPHEN 650 MG RE SUPP: 650.0000 mg | RECTAL | Status: DC | PRN

## 2018-01-05 MED ORDER — LORAZEPAM 2 MG/ML IJ SOLN
INTRAMUSCULAR | Status: AC
  Filled 2018-01-05: qty 1

## 2018-01-05 MED ORDER — ACETAMINOPHEN 160 MG/5ML PO SOLN: 650.0000 mg | ORAL | Status: DC | PRN

## 2018-01-05 MED ORDER — SODIUM CHLORIDE 0.9 % IV BOLUS
500.0000 mL | Freq: Once | INTRAVENOUS | Status: AC
  Administered 2018-01-05: 500 mL via INTRAVENOUS

## 2018-01-05 MED ORDER — ACETAMINOPHEN 325 MG PO TABS
650.0000 mg | ORAL_TABLET | ORAL | Status: DC | PRN
  Administered 2018-01-07: 650 mg via ORAL
  Filled 2018-01-05: qty 2

## 2018-01-05 MED ORDER — INSULIN ASPART 100 UNIT/ML ~~LOC~~ SOLN: 0.0000 [IU] | Freq: Every day | SUBCUTANEOUS | Status: DC

## 2018-01-05 MED ORDER — LORAZEPAM 2 MG/ML IJ SOLN
2.0000 mg | Freq: Once | INTRAMUSCULAR | Status: AC
  Administered 2018-01-05: 2 mg via INTRAVENOUS

## 2018-01-05 MED ORDER — STROKE: EARLY STAGES OF RECOVERY BOOK
Freq: Once | Status: DC
  Filled 2018-01-05 (×2): qty 1

## 2018-01-05 MED ORDER — FENTANYL CITRATE (PF) 100 MCG/2ML IJ SOLN
25.0000 ug | Freq: Once | INTRAMUSCULAR | Status: AC
  Administered 2018-01-05: 25 ug via INTRAVENOUS
  Filled 2018-01-05: qty 2

## 2018-01-05 MED ORDER — KETOROLAC TROMETHAMINE 30 MG/ML IJ SOLN
30.0000 mg | Freq: Once | INTRAMUSCULAR | Status: AC
  Administered 2018-01-05: 30 mg via INTRAVENOUS
  Filled 2018-01-05: qty 1

## 2018-01-05 MED ORDER — ASPIRIN 325 MG PO TABS
325.0000 mg | ORAL_TABLET | Freq: Every day | ORAL | Status: DC
  Administered 2018-01-05 – 2018-01-08 (×4): 325 mg via ORAL
  Filled 2018-01-05 (×4): qty 1

## 2018-01-05 MED ORDER — LORAZEPAM 2 MG/ML IJ SOLN: 2.0000 mg | INTRAMUSCULAR | Status: AC

## 2018-01-05 MED ORDER — DIPHENHYDRAMINE HCL 50 MG/ML IJ SOLN
25.0000 mg | Freq: Once | INTRAMUSCULAR | Status: AC
  Administered 2018-01-05: 25 mg via INTRAVENOUS
  Filled 2018-01-05: qty 1

## 2018-01-05 MED ORDER — IOPAMIDOL (ISOVUE-370) INJECTION 76%
100.0000 mL | Freq: Once | INTRAVENOUS | Status: AC | PRN
  Administered 2018-01-05: 100 mL via INTRAVENOUS

## 2018-01-05 NOTE — Progress Notes (Signed)
Code Stroke Times   1644 Called, Beeped 1655 exam started  1657 Exam finished 1658 images sent , completed in epic  1659 GR called   Delay in getting Pt to ct

## 2018-01-05 NOTE — ED Triage Notes (Addendum)
Head all day today. Worse in last hour. 35 min ago had shaking episode. Chief complaint is headache to top right head. Pt has hx vascular stent. Hx of stroke x 2. Pt arrived. Lethargic but oriented to most. Left arm shaking upon arrival. edp at bedside. cbg 234

## 2018-01-05 NOTE — H&P (Signed)
TRH H&P   Patient Demographics:    Devin [name], is a 47 y.o. male  MRN: [id number]   DOB - [birth year]  Admit Date - 01/05/2018  Outpatient Primary MD for the patient is [facility]  Referring MD/NP/PA: Nat Christen Outpatient Specialists:     Patient coming from: home  No chief complaint on file.  shaking   HPI:    Devin [name]  is a 47 y.o. male, w hypertension, hyperlipidemia, dm2, CAD s/p stent, h/o recent CVA, w intracranial stent,  smoker apparently c/o shaking of the left side while at [facility] at about 4 pm.  He had just eaten prior to this.  Pt noted right sided headache, otherwise denies LOC, syncope, biting tongue, blurred vision, vision loss,  dysarthria, cp, palp, sob, n/v, diarrhea, brbpr, black stool.  Pt presented to ED, due to shaking   In ED,   CT brain IMPRESSION: 1. No acute hemorrhage. 2. ASPECTS is 10. 3. Stented basilar artery.   CTA head/ neck IMPRESSION: 1. No emergent large vessel occlusion or high grade stenosis. 2. Patent basilar artery stent. 3. 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  UDS negative Urinalysis negative ETOH <10  INR 0.93 PTT 28  Wbc 15.5, Hgb 13.7, Plt 308  Na 137, K 4.1, Bun 16, Creatinine 1.19  Ast 22, Alt 19  TSH 60.657  Pt will be admitted for shaking r/o CVA, seizure    Review of systems:    In addition to the HPI above,   No Fever-chills, No changes with Vision or hearing, No problems swallowing food or Liquids, No Chest pain, Cough or Shortness of Breath, No Abdominal pain, No Nausea or Vommitting, Bowel movements are regular, No Blood in stool or Urine, No dysuria, No new skin rashes or bruises, No new joints pains-aches,  No new weakness, tingling, numbness in  any extremity, No recent weight gain or loss, No polyuria, polydypsia or polyphagia, No significant Mental Stressors.  A full 10 point Review of Systems was done, except as stated above, all other Review of Systems were negative.   With Past History of the following :    Past Medical History:  Diagnosis Date  . Asthma   . Chest pain   . Coronary artery disease   . Dyspnea   . GERD (gastroesophageal reflux disease)   . History of kidney stones   . History of rhabdomyolysis   . Hyperlipidemia   . Hypertension   . Hypothyroidism   . Kidney calculus 2014  . Pericardial effusion    Small, by dobutamine echocardiogram, 04/2006  . Stroke ([place])   . Tobacco abuse   . Type 2 diabetes mellitus without complications ([place]) 1/84/8592  . Vitamin D deficiency       Past Surgical  History:  Procedure Laterality Date  . 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 CHOLECYSTECTOMY N/A 01/27/2013   Procedure: LAPAROSCOPIC CHOLECYSTECTOMY;  Surgeon: Jamesetta So, MD;  Location: [facility];  Service: General;  Laterality: N/A;  . CORONARY STENT INTERVENTION N/A 11/15/2017   Procedure: CORONARY STENT INTERVENTION;  Surgeon: Leonie Man, MD;  Location: [place] [facility];  Service: Cardiovascular;  Laterality: N/A;  . HERNIA REPAIR     As a child  . INSERTION OF MESH N/A 11/13/2012   Procedure: INSERTION OF MESH;  Surgeon: Jamesetta So, MD;  Location: [facility];  Service: General;  Laterality: N/A;  . IR ANGIO INTRA EXTRACRAN SEL COM CAROTID INNOMINATE BILAT MOD SED  12/01/2017  . IR ANGIO VERTEBRAL SEL VERTEBRAL BILAT MOD SED  12/01/2017  . IR INTRA CRAN STENT  12/03/2017  . KIDNEY STONE SURGERY    . LEFT HEART CATH AND CORONARY ANGIOGRAPHY N/A 11/15/2017   Procedure: LEFT HEART CATH AND CORONARY ANGIOGRAPHY;  Surgeon: Leonie Man, MD;  Location: [place] [facility];  Service: Cardiovascular;  Laterality: N/A;  .  PERCUTANEOUS NEPHROLITHOTRIPSY    . RADIOLOGY WITH ANESTHESIA Left 12/03/2017   Procedure: Angioplasty with possible stenting of left VBJ;  Surgeon: Luanne Bras, MD;  Location: [place];  Service: Radiology;  Laterality: Left;  . UMBILICAL HERNIA REPAIR N/A 11/13/2012   Procedure: UMBILICAL HERNIORRHAPHY;  Surgeon: Jamesetta So, MD;  Location: [facility];  Service: General;  Laterality: N/A;  . VARICOCELECTOMY        Social History:     Social History   Tobacco Use  . Smoking status: Current Some Day Smoker    Packs/day: 0.25    Years: 26.00    Pack years: 6.50    Types: Cigarettes    Start date: 03/13/1985  . Smokeless tobacco: Former Systems developer    Types: Snuff, Chew    Quit date: 03/14/1987  . Tobacco comment: 8 PER DAY  Substance Use Topics  . Alcohol use: No     Lives - lives at home  Mobility - walks by self   Family History :     Family History  Problem Relation Age of Onset  . Coronary artery disease Father   . Hypertension Father   . Hyperlipidemia Father   . Diabetes Father   . Congestive Heart Failure Father 34  . Arrhythmia Father        had an ICD  . Diabetes Mother   . Hypertension Mother   . Hyperlipidemia Mother   . Obesity Mother 74       died after bariatric surgery, liver failure and infection  . Coronary artery disease Maternal Grandfather        both grandfathers and several uncles  . Coronary artery disease Unknown   . Diabetes Sister        both sisters  . Stroke Maternal Aunt   . Stroke Maternal Uncle        Home Medications:   Prior to Admission medications   Medication Sig Start Date End Date Taking? Authorizing Provider  aspirin 81 MG EC tablet Take 1 tablet (81 mg total) by mouth daily. With food 12/05/17  Yes Emokpae, Courage, MD  citalopram (CELEXA) 10 MG tablet Take 1 tablet (10 mg total) by mouth daily. 01/04/18  Yes Venancio Poisson, NP  ezetimibe (ZETIA) 10 MG tablet Take 1 tablet (10  mg total) by mouth daily. 12/06/17  Yes  Emokpae, Courage, MD  gabapentin (NEURONTIN) 300 MG capsule Take 1 capsule (300 mg total) by mouth 2 (two) times daily. 01/04/18  Yes Venancio Poisson, NP  glimepiride (AMARYL) 2 MG tablet Take 1 tablet (2 mg total) by mouth daily with breakfast. 11/16/17  Yes Patrecia Pour, MD  levothyroxine (SYNTHROID, LEVOTHROID) 175 MCG tablet Take 175 mcg by mouth daily before breakfast.   Yes [provider]  nitroGLYCERIN (NITROSTAT) 0.4 MG SL tablet Place 0.4 mg under the tongue every 5 (five) minutes as needed for chest pain.   Yes [provider]  pantoprazole (PROTONIX) 40 MG tablet Take 40 mg by mouth every morning.   Yes [provider]  sacubitril-valsartan (ENTRESTO) 24-26 MG Take 1 tablet by mouth 2 (two) times daily. 11/26/17  Yes BranchAlphonse Guild, MD  ticagrelor (BRILINTA) 90 MG TABS tablet Take 1 tablet (90 mg total) by mouth 2 (two) times daily. 11/16/17  Yes Patrecia Pour, MD  Blood Glucose Monitoring Suppl w/Device KIT Dispense based on patient and insurance preference. Check blood sugar daily before breakfast (ICD9 250.0) 04/29/17   Pollina, Gwenyth Allegra, MD  levothyroxine (SYNTHROID, LEVOTHROID) 125 MCG tablet Take 1 tablet (125 mcg total) by mouth daily before breakfast. Patient not taking: Reported on 01/05/2018 11/16/17   Patrecia Pour, MD  pantoprazole (PROTONIX) 20 MG tablet Take 1 tablet (20 mg total) by mouth daily. Patient not taking: Reported on 01/05/2018 11/16/17   Patrecia Pour, MD     Allergies:     Allergies  Allergen Reactions  . Bee Venom Swelling  . Crestor [Rosuvastatin] Other (See Comments)    rhabdomyolosis     Physical Exam:   Vitals  Blood pressure (!) 150/116, pulse 71, resp. rate 18, SpO2 97 %.   1. General lying in bed in NAD,   2. Normal affect and insight, Not Suicidal or Homicidal, Awake Alert, Oriented X 3.  3. No F.N deficits, ALL C.Nerves Intact, Strength 5/5 all 4 extremities, Sensation intact all 4 extremities,  Plantars down going.  4. Ears and Eyes appear Normal, Conjunctivae clear, PERRLA. Moist Oral Mucosa.  5. Supple Neck, No JVD, No cervical lymphadenopathy appriciated, No Carotid Bruits.  6. Symmetrical Chest wall movement, Good air movement bilaterally, CTAB.  7. RRR, No Gallops, Rubs or Murmurs, No Parasternal Heave.  8. Positive Bowel Sounds, Abdomen Soft, No tenderness, No organomegaly appriciated,No rebound -guarding or rigidity.  9.  No Cyanosis, Normal Skin Turgor, No Skin Rash or Bruise.  10. Good muscle tone,  joints appear normal , no effusions, Normal ROM.  11. No Palpable Lymph Nodes in Neck or Axillae   No tongue laceration   Data Review:    CBC Recent Labs  Lab 01/05/18 1654 01/05/18 1701  WBC 15.5*  --   HGB 13.7 14.3  HCT 43.8 42.0  PLT 308  --   MCV 91.4  --   MCH 28.6  --   MCHC 31.3  --   RDW 14.6  --   LYMPHSABS 4.1*  --   MONOABS 0.8  --   EOSABS 0.4  --   BASOSABS 0.1  --    ------------------------------------------------------------------------------------------------------------------  Chemistries  Recent Labs  Lab 01/05/18 1654 01/05/18 1701  NA 137 138  K 4.1 4.3  CL 102 103  CO2 24  --   GLUCOSE 256* 259*  BUN 16 20  CREATININE 1.19 1.20  CALCIUM 9.5  --  AST 22  --   ALT 19  --   ALKPHOS 60  --   BILITOT 0.6  --    ------------------------------------------------------------------------------------------------------------------ estimated creatinine clearance is 116 mL/min (by C-G formula based on SCr of 1.2 mg/dL). ------------------------------------------------------------------------------------------------------------------ Recent Labs    01/05/18 1654  TSH 60.657*    Coagulation profile Recent Labs  Lab 01/05/18 1654  INR 0.93   ------------------------------------------------------------------------------------------------------------------- No results for input(s): DDIMER in the last 72  hours. -------------------------------------------------------------------------------------------------------------------  Cardiac Enzymes No results for input(s): CKMB, TROPONINI, MYOGLOBIN in the last 168 hours.  Invalid input(s): CK ------------------------------------------------------------------------------------------------------------------ No results found for: BNP   ---------------------------------------------------------------------------------------------------------------  Urinalysis    Component Value Date/Time   COLORURINE YELLOW 01/05/2018 [place] 01/05/2018 1652   LABSPEC >1.046 (H) 01/05/2018 1652   PHURINE 6.0 01/05/2018 1652   GLUCOSEU NEGATIVE 01/05/2018 1652   HGBUR NEGATIVE 01/05/2018 1652   BILIRUBINUR NEGATIVE 01/05/2018 1652   KETONESUR NEGATIVE 01/05/2018 1652   PROTEINUR NEGATIVE 01/05/2018 1652   UROBILINOGEN 0.2 10/06/2013 0305   NITRITE NEGATIVE 01/05/2018 1652   LEUKOCYTESUR NEGATIVE 01/05/2018 1652    ----------------------------------------------------------------------------------------------------------------   Imaging Results:    Ct Angio Head W Or Wo Contrast  Result Date: 01/05/2018 CLINICAL DATA:  Altered mental status EXAM: CT ANGIOGRAPHY HEAD AND NECK TECHNIQUE: Multidetector CT imaging of the head and neck was performed using the standard protocol during bolus administration of intravenous contrast. Multiplanar CT image reconstructions and MIPs were obtained to evaluate the vascular anatomy. Carotid stenosis measurements (when applicable) are obtained utilizing NASCET criteria, using the distal internal carotid diameter as the denominator. CONTRAST:  160m ISOVUE-370 IOPAMIDOL (ISOVUE-370) INJECTION 76% COMPARISON:  Head CT 01/05/2018 and 11/30/2017 FINDINGS: CTA NECK FINDINGS AORTIC ARCH: There is no calcific atherosclerosis of the aortic arch. There is no aneurysm, dissection or hemodynamically significant stenosis  of the visualized ascending aorta and aortic arch. Conventional 3 vessel aortic branching pattern. The visualized proximal subclavian arteries are widely patent. RIGHT CAROTID SYSTEM: --Common carotid artery: Widely patent origin without common carotid artery dissection or aneurysm. --Internal carotid artery: No dissection, occlusion or aneurysm. No hemodynamically significant stenosis. --External carotid artery: No acute abnormality. LEFT CAROTID SYSTEM: --Common carotid artery: Widely patent origin without common carotid artery dissection or aneurysm. --Internal carotid artery:No dissection, occlusion or aneurysm. Mild atherosclerotic calcification at the carotid bifurcation without hemodynamically significant stenosis. --External carotid artery: No acute abnormality. VERTEBRAL ARTERIES: Codominant configuration. Both origins are normal. The right vertebral artery is occluded distal to the origin of the right PICA but proximal to the vertebrobasilar confluence. Left vertebral artery remains patent. SKELETON: There is no bony spinal canal stenosis. No lytic or blastic lesion. OTHER NECK: Normal pharynx, larynx and major salivary glands. No cervical lymphadenopathy. Unremarkable thyroid gland. UPPER CHEST: No pneumothorax or pleural effusion. No nodules or masses. CTA HEAD FINDINGS ANTERIOR CIRCULATION: --Intracranial internal carotid arteries: Normal. --Anterior cerebral arteries: Normal. Both A1 segments are present. Patent anterior communicating artery. --Middle cerebral arteries: Normal. --Posterior communicating arteries: Absent bilaterally. POSTERIOR CIRCULATION: --Basilar artery: Patent stent within the basilar artery. --Posterior cerebral arteries: Normal. --Superior cerebellar arteries: Normal. --Inferior cerebellar arteries: Posterior inferior cerebellar arteries are normal. The right AICA is not clearly visualized. VENOUS SINUSES: As permitted by contrast timing, patent. ANATOMIC VARIANTS: None DELAYED  PHASE: No parenchymal contrast enhancement. Review of the MIP images confirms the above findings. IMPRESSION: 1. No emergent large vessel occlusion or high grade stenosis. 2. Patent basilar artery stent. 3. Occlusion of the distal V4  segment of the right vertebral artery, distal to the patent right PICA origin and proximal to the vertebrobasilar confluence. Electronically Signed   By: Ulyses Jarred M.D.   On: 01/05/2018 17:38   Dg Chest 2 View  Result Date: 01/05/2018 CLINICAL DATA:  Altered mental status EXAM: CHEST - 2 VIEW COMPARISON:  Chest CT 11/30/2017 FINDINGS: The heart size and mediastinal contours are within normal limits. Both lungs are clear. The visualized skeletal structures are unremarkable. IMPRESSION: No active cardiopulmonary disease. Electronically Signed   By: Ulyses Jarred M.D.   On: 01/05/2018 21:20   Ct Angio Neck W And/or Wo Contrast  Result Date: 01/05/2018 CLINICAL DATA:  Altered mental status EXAM: CT ANGIOGRAPHY HEAD AND NECK TECHNIQUE: Multidetector CT imaging of the head and neck was performed using the standard protocol during bolus administration of intravenous contrast. Multiplanar CT image reconstructions and MIPs were obtained to evaluate the vascular anatomy. Carotid stenosis measurements (when applicable) are obtained utilizing NASCET criteria, using the distal internal carotid diameter as the denominator. CONTRAST:  110m ISOVUE-370 IOPAMIDOL (ISOVUE-370) INJECTION 76% COMPARISON:  Head CT 01/05/2018 and 11/30/2017 FINDINGS: CTA NECK FINDINGS AORTIC ARCH: There is no calcific atherosclerosis of the aortic arch. There is no aneurysm, dissection or hemodynamically significant stenosis of the visualized ascending aorta and aortic arch. Conventional 3 vessel aortic branching pattern. The visualized proximal subclavian arteries are widely patent. RIGHT CAROTID SYSTEM: --Common carotid artery: Widely patent origin without common carotid artery dissection or aneurysm.  --Internal carotid artery: No dissection, occlusion or aneurysm. No hemodynamically significant stenosis. --External carotid artery: No acute abnormality. LEFT CAROTID SYSTEM: --Common carotid artery: Widely patent origin without common carotid artery dissection or aneurysm. --Internal carotid artery:No dissection, occlusion or aneurysm. Mild atherosclerotic calcification at the carotid bifurcation without hemodynamically significant stenosis. --External carotid artery: No acute abnormality. VERTEBRAL ARTERIES: Codominant configuration. Both origins are normal. The right vertebral artery is occluded distal to the origin of the right PICA but proximal to the vertebrobasilar confluence. Left vertebral artery remains patent. SKELETON: There is no bony spinal canal stenosis. No lytic or blastic lesion. OTHER NECK: Normal pharynx, larynx and major salivary glands. No cervical lymphadenopathy. Unremarkable thyroid gland. UPPER CHEST: No pneumothorax or pleural effusion. No nodules or masses. CTA HEAD FINDINGS ANTERIOR CIRCULATION: --Intracranial internal carotid arteries: Normal. --Anterior cerebral arteries: Normal. Both A1 segments are present. Patent anterior communicating artery. --Middle cerebral arteries: Normal. --Posterior communicating arteries: Absent bilaterally. POSTERIOR CIRCULATION: --Basilar artery: Patent stent within the basilar artery. --Posterior cerebral arteries: Normal. --Superior cerebellar arteries: Normal. --Inferior cerebellar arteries: Posterior inferior cerebellar arteries are normal. The right AICA is not clearly visualized. VENOUS SINUSES: As permitted by contrast timing, patent. ANATOMIC VARIANTS: None DELAYED PHASE: No parenchymal contrast enhancement. Review of the MIP images confirms the above findings. IMPRESSION: 1. No emergent large vessel occlusion or high grade stenosis. 2. Patent basilar artery stent. 3. 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 Electronically Signed   By: KUlyses JarredM.D.   On: 01/05/2018 17:38   Ct Head Code Stroke Wo Contrast  Result Date: 01/05/2018 CLINICAL DATA:  Code stroke. [place] headache. History of multiple strokes. EXAM: CT HEAD WITHOUT CONTRAST TECHNIQUE: Contiguous axial images were obtained from the base of the skull through the vertex without intravenous contrast. COMPARISON:  Head CT 12/03/2017 FINDINGS: Brain: There is no mass, hemorrhage or extra-axial collection. The size and configuration of the  ventricles and extra-axial CSF spaces are normal. There is no acute or chronic infarction. The brain parenchyma is normal. Vascular: Basilar artery stent. Skull: The visualized skull base, calvarium and extracranial soft tissues are normal. Sinuses/Orbits: No fluid levels or advanced mucosal thickening of the visualized paranasal sinuses. No mastoid or middle ear effusion. The orbits are normal. ASPECTS [facility] Stroke Program Early CT Score) - Ganglionic level infarction (caudate, lentiform nuclei, internal capsule, insula, M1-M3 cortex): 7 - Supraganglionic infarction (M4-M6 cortex): 3 Total score (0-10 with 10 being normal): 10 IMPRESSION: 1. No acute hemorrhage. 2. ASPECTS is 10. 3. Stented basilar artery. These results were called by telephone at the time of interpretation on 01/05/2018 at 5:09 pm to Dr. Nat Christen , who verbally acknowledged these results. Electronically Signed   By: Ulyses Jarred M.D.   On: 01/05/2018 17:09   ekg ST at 105   Assessment & Plan:    Principal Problem:   Altered mental status Active Problems:   Hypertension   Hyperlipidemia   Hypothyroidism   Type 2 diabetes mellitus without complications (HCC)   History of stroke    Shaking, ? TIA, ? Seizure ? Psychogenic Appreciate teleneurology input Check MRI brain Check EEG Transfer to [facility] since no neurology or MRI services at [facility] over weekend.   Please call neurology in AM for input regarding shaking  H/o CVA with intracranial stent Cont Aspirin Cont Brillinta Cont Zetia  Dm2 Cont Amaryl fsbs ac and qhs, ISS  Chronic headache Cont Gabapentin  Depression Cont celexa 40m po qday  Gerd Cont PPI  Hypothyroidism Cont Levothyroxine  Will need close f/u as outpatient  Hypertension    DVT Prophylaxis  - SCDs   AM Labs Ordered, also please review Full Orders  Family Communication: Admission, patients condition and plan of care including tests being ordered have been discussed with the patient  who indicate understanding and agree with the plan and Code Status.  Code Status  FULL CODE  Likely DC to  home  Condition GUARDED    Consults called: none, please call neurology in AM once MRI brain and EEG completed for input   Admission status:   Observation, pt requires evaluation of shaking r/o TIA vs CVA. Depending upon results of w/up might need to change to inpatient status  Time spent in minutes : 710  JJani GravelM.D on 01/05/2018 at 9:30 PM  Between 7am to 7pm - Pager - [phone]  After 7pm go to [email or web address] - password [facility] - Office  [phone]

## 2018-01-05 NOTE — ED Provider Notes (Signed)
[facility] EMERGENCY DEPARTMENT Provider Note   CSN: [id number] Arrival date & time: 01/05/18  1644   An emergency department physician performed an initial assessment on this suspected stroke patient at 1655.  History   Chief Complaint No chief complaint on file.   HPI Devin [name] is a 47 y.o. male.  Level 5 caveat for acuity of condition.  History obtained from EMS and sister.  Patient was acting normally at [facility] when suddenly at approximately 1600 he started shaking all over and appeared confused.  Glucose 234 at that time.  Patient has multiple serious health problems including CAD, hypertension, hypothyroidism, hyperlipidemia, stroke, tobacco abuse, diabetes type 2, many others.  He was brought to the ED emergently by EMS.     Past Medical History:  Diagnosis Date  . Asthma   . Chest pain   . Coronary artery disease   . Dyspnea   . GERD (gastroesophageal reflux disease)   . History of kidney stones   . History of rhabdomyolysis   . Hyperlipidemia   . Hypertension   . Hypothyroidism   . Kidney calculus 2014  . Pericardial effusion    Small, by dobutamine echocardiogram, 04/2006  . Stroke ([place])   . Tobacco abuse   . Type 2 diabetes mellitus without complications ([place]) 3/61/4431  . Vitamin D deficiency     Patient Active Problem List   Diagnosis Date Noted  . 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 Cerebellar stroke ([place])   . TIA (transient ischemic attack) 11/30/2017  . Abnormal cardiac CT angiography   . Coronary artery disease   . Chest pain due to CAD ([place]) 11/14/2017  . Chest pain with moderate risk for cardiac etiology   . CKD (chronic kidney disease) 11/13/2017  . Hypothyroidism 10/07/2013  . Hyponatremia 10/07/2013  . Type 2 diabetes mellitus without complications ([place]) 54/00/8676  . Rhabdomyolysis 10/06/2013  . Acute renal failure ([place]) 10/06/2013  . Elevated AST (SGOT) 10/06/2013  . Elevated ALT measurement 10/06/2013  . Myalgia and  myositis, unspecified 10/06/2013  . Chest pain   . Tobacco abuse   . Hypertension   . Hyperlipidemia   . Pericardial effusion   . History of rhabdomyolysis     Past Surgical History:  Procedure Laterality Date  . 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 CHOLECYSTECTOMY N/A 01/27/2013   Procedure: LAPAROSCOPIC CHOLECYSTECTOMY;  Surgeon: Jamesetta So, MD;  Location: [facility];  Service: General;  Laterality: N/A;  . CORONARY STENT INTERVENTION N/A 11/15/2017   Procedure: CORONARY STENT INTERVENTION;  Surgeon: Leonie Man, MD;  Location: [place] [facility];  Service: Cardiovascular;  Laterality: N/A;  . HERNIA REPAIR     As a child  . INSERTION OF MESH N/A 11/13/2012   Procedure: INSERTION OF MESH;  Surgeon: Jamesetta So, MD;  Location: [facility];  Service: General;  Laterality: N/A;  . IR ANGIO INTRA EXTRACRAN SEL COM CAROTID INNOMINATE BILAT MOD SED  12/01/2017  . IR ANGIO VERTEBRAL SEL VERTEBRAL BILAT MOD SED  12/01/2017  . IR INTRA CRAN STENT  12/03/2017  . KIDNEY STONE SURGERY    . LEFT HEART CATH AND CORONARY ANGIOGRAPHY N/A 11/15/2017   Procedure: LEFT HEART CATH AND CORONARY ANGIOGRAPHY;  Surgeon: Leonie Man, MD;  Location: [place] [facility];  Service: Cardiovascular;  Laterality: N/A;  . PERCUTANEOUS NEPHROLITHOTRIPSY    . RADIOLOGY WITH ANESTHESIA Left  12/03/2017   Procedure: Angioplasty with possible stenting of left VBJ;  Surgeon: Luanne Bras, MD;  Location: [place];  Service: Radiology;  Laterality: Left;  . UMBILICAL HERNIA REPAIR N/A 11/13/2012   Procedure: UMBILICAL HERNIORRHAPHY;  Surgeon: Jamesetta So, MD;  Location: [facility];  Service: General;  Laterality: N/A;  . VARICOCELECTOMY          Home Medications    Prior to Admission medications   Medication Sig Start Date End Date Taking? Authorizing Provider  aspirin 81 MG EC tablet Take 1 tablet (81 mg total) by mouth daily.  With food 12/05/17  Yes Emokpae, Courage, MD  citalopram (CELEXA) 10 MG tablet Take 1 tablet (10 mg total) by mouth daily. 01/04/18  Yes Venancio Poisson, NP  ezetimibe (ZETIA) 10 MG tablet Take 1 tablet (10 mg total) by mouth daily. 12/06/17  Yes Emokpae, Courage, MD  gabapentin (NEURONTIN) 300 MG capsule Take 1 capsule (300 mg total) by mouth 2 (two) times daily. 01/04/18  Yes Venancio Poisson, NP  glimepiride (AMARYL) 2 MG tablet Take 1 tablet (2 mg total) by mouth daily with breakfast. 11/16/17  Yes Patrecia Pour, MD  levothyroxine (SYNTHROID, LEVOTHROID) 175 MCG tablet Take 175 mcg by mouth daily before breakfast.   Yes [provider]  nitroGLYCERIN (NITROSTAT) 0.4 MG SL tablet Place 0.4 mg under the tongue every 5 (five) minutes as needed for chest pain.   Yes [provider]  pantoprazole (PROTONIX) 40 MG tablet Take 40 mg by mouth every morning.   Yes [provider]  sacubitril-valsartan (ENTRESTO) 24-26 MG Take 1 tablet by mouth 2 (two) times daily. 11/26/17  Yes BranchAlphonse Guild, MD  ticagrelor (BRILINTA) 90 MG TABS tablet Take 1 tablet (90 mg total) by mouth 2 (two) times daily. 11/16/17  Yes Patrecia Pour, MD  Blood Glucose Monitoring Suppl w/Device KIT Dispense based on patient and insurance preference. Check blood sugar daily before breakfast (ICD9 250.0) 04/29/17   Pollina, Gwenyth Allegra, MD  levothyroxine (SYNTHROID, LEVOTHROID) 125 MCG tablet Take 1 tablet (125 mcg total) by mouth daily before breakfast. Patient not taking: Reported on 01/05/2018 11/16/17   Patrecia Pour, MD  pantoprazole (PROTONIX) 20 MG tablet Take 1 tablet (20 mg total) by mouth daily. Patient not taking: Reported on 01/05/2018 11/16/17   Patrecia Pour, MD    Family History Family History  Problem Relation Age of Onset  . Coronary artery disease Father   . Hypertension Father   . Hyperlipidemia Father   . Diabetes Father   . Congestive Heart Failure Father 30  . Arrhythmia Father         had an ICD  . Diabetes Mother   . Hypertension Mother   . Hyperlipidemia Mother   . Obesity Mother 16       died after bariatric surgery, liver failure and infection  . Coronary artery disease Maternal Grandfather        both grandfathers and several uncles  . Coronary artery disease Unknown   . Diabetes Sister        both sisters  . Stroke Maternal Aunt   . Stroke Maternal Uncle     Social History Social History   Tobacco Use  . Smoking status: Current Some Day Smoker    Packs/day: 0.25    Years: 26.00    Pack years: 6.50    Types: Cigarettes    Start date: 03/13/1985  . Smokeless tobacco: Former Systems developer  Types: Snuff, Chew    Quit date: 03/14/1987  . Tobacco comment: 8 PER DAY  Substance Use Topics  . Alcohol use: No  . Drug use: No     Allergies   Bee venom and Crestor [rosuvastatin]   Review of Systems Review of Systems  Unable to perform ROS: Acuity of condition     Physical Exam Updated Vital Signs BP 115/84   Pulse 84   Resp (!) 24   SpO2 95%   Physical Exam  Constitutional:  Pale, not answering direct questions, left arm shaking.  HENT:  Head: Normocephalic and atraumatic.  Eyes: Conjunctivae are normal.  Neck: Neck supple.  Cardiovascular: Normal rate and regular rhythm.  Pulmonary/Chest: Effort normal and breath sounds normal.  Abdominal: Soft. Bowel sounds are normal.  Musculoskeletal: Normal range of motion.  Patient was able to move all 4 extremities to command  Neurological:  Unable  Skin: Skin is warm and dry.  Psychiatric:  Confused  Nursing note and vitals reviewed.    ED Treatments / Results  Labs (all labs ordered are listed, but only abnormal results are displayed) Labs Reviewed  CBC - Abnormal; Notable for the following components:      Result Value   WBC 15.5 (*)    All other components within normal limits  DIFFERENTIAL - Abnormal; Notable for the following components:   Neutro Abs 10.1 (*)    Lymphs Abs 4.1  (*)    All other components within normal limits  COMPREHENSIVE METABOLIC PANEL - Abnormal; Notable for the following components:   Glucose, Bld 256 (*)    All other components within normal limits  CBG MONITORING, ED - Abnormal; Notable for the following components:   Glucose-Capillary 271 (*)    All other components within normal limits  I-STAT CHEM 8, ED - Abnormal; Notable for the following components:   Glucose, Bld 259 (*)    All other components within normal limits  ETHANOL  PROTIME-INR  APTT  RAPID URINE DRUG SCREEN, HOSP PERFORMED  URINALYSIS, ROUTINE W REFLEX MICROSCOPIC  TSH  I-STAT TROPONIN, ED    EKG EKG Interpretation  Date/Time:  Saturday January 05 2018 16:51:37 EDT Ventricular Rate:  105 PR Interval:    QRS Duration: 105 QT Interval:  346 QTC Calculation: 458 R Axis:   124 Text Interpretation:  Sinus tachycardia Right axis deviation Borderline T abnormalities, inferior leads Baseline wander in lead(s) V4 Confirmed by Nat Christen [phone]) on 01/05/2018 7:20:15 PM   Radiology Ct Angio Head W Or Wo Contrast  Result Date: 01/05/2018 CLINICAL DATA:  Altered mental status EXAM: CT ANGIOGRAPHY HEAD AND NECK TECHNIQUE: Multidetector CT imaging of the head and neck was performed using the standard protocol during bolus administration of intravenous contrast. Multiplanar CT image reconstructions and MIPs were obtained to evaluate the vascular anatomy. Carotid stenosis measurements (when applicable) are obtained utilizing NASCET criteria, using the distal internal carotid diameter as the denominator. CONTRAST:  111m ISOVUE-370 IOPAMIDOL (ISOVUE-370) INJECTION 76% COMPARISON:  Head CT 01/05/2018 and 11/30/2017 FINDINGS: CTA NECK FINDINGS AORTIC ARCH: There is no calcific atherosclerosis of the aortic arch. There is no aneurysm, dissection or hemodynamically significant stenosis of the visualized ascending aorta and aortic arch. Conventional 3 vessel aortic branching  pattern. The visualized proximal subclavian arteries are widely patent. RIGHT CAROTID SYSTEM: --Common carotid artery: Widely patent origin without common carotid artery dissection or aneurysm. --Internal carotid artery: No dissection, occlusion or aneurysm. No hemodynamically significant stenosis. --External carotid artery: No acute  abnormality. LEFT CAROTID SYSTEM: --Common carotid artery: Widely patent origin without common carotid artery dissection or aneurysm. --Internal carotid artery:No dissection, occlusion or aneurysm. Mild atherosclerotic calcification at the carotid bifurcation without hemodynamically significant stenosis. --External carotid artery: No acute abnormality. VERTEBRAL ARTERIES: Codominant configuration. Both origins are normal. The right vertebral artery is occluded distal to the origin of the right PICA but proximal to the vertebrobasilar confluence. Left vertebral artery remains patent. SKELETON: There is no bony spinal canal stenosis. No lytic or blastic lesion. OTHER NECK: Normal pharynx, larynx and major salivary glands. No cervical lymphadenopathy. Unremarkable thyroid gland. UPPER CHEST: No pneumothorax or pleural effusion. No nodules or masses. CTA HEAD FINDINGS ANTERIOR CIRCULATION: --Intracranial internal carotid arteries: Normal. --Anterior cerebral arteries: Normal. Both A1 segments are present. Patent anterior communicating artery. --Middle cerebral arteries: Normal. --Posterior communicating arteries: Absent bilaterally. POSTERIOR CIRCULATION: --Basilar artery: Patent stent within the basilar artery. --Posterior cerebral arteries: Normal. --Superior cerebellar arteries: Normal. --Inferior cerebellar arteries: Posterior inferior cerebellar arteries are normal. The right AICA is not clearly visualized. VENOUS SINUSES: As permitted by contrast timing, patent. ANATOMIC VARIANTS: None DELAYED PHASE: No parenchymal contrast enhancement. Review of the MIP images confirms the above  findings. IMPRESSION: 1. No emergent large vessel occlusion or high grade stenosis. 2. Patent basilar artery stent. 3. 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 Electronically Signed   By: Ulyses Jarred M.D.   On: 01/05/2018 17:38   Ct Angio Neck W And/or Wo Contrast  Result Date: 01/05/2018 CLINICAL DATA:  Altered mental status EXAM: CT ANGIOGRAPHY HEAD AND NECK TECHNIQUE: Multidetector CT imaging of the head and neck was performed using the standard protocol during bolus administration of intravenous contrast. Multiplanar CT image reconstructions and MIPs were obtained to evaluate the vascular anatomy. Carotid stenosis measurements (when applicable) are obtained utilizing NASCET criteria, using the distal internal carotid diameter as the denominator. CONTRAST:  158m ISOVUE-370 IOPAMIDOL (ISOVUE-370) INJECTION 76% COMPARISON:  Head CT 01/05/2018 and 11/30/2017 FINDINGS: CTA NECK FINDINGS AORTIC ARCH: There is no calcific atherosclerosis of the aortic arch. There is no aneurysm, dissection or hemodynamically significant stenosis of the visualized ascending aorta and aortic arch. Conventional 3 vessel aortic branching pattern. The visualized proximal subclavian arteries are widely patent. RIGHT CAROTID SYSTEM: --Common carotid artery: Widely patent origin without common carotid artery dissection or aneurysm. --Internal carotid artery: No dissection, occlusion or aneurysm. No hemodynamically significant stenosis. --External carotid artery: No acute abnormality. LEFT CAROTID SYSTEM: --Common carotid artery: Widely patent origin without common carotid artery dissection or aneurysm. --Internal carotid artery:No dissection, occlusion or aneurysm. Mild atherosclerotic calcification at the carotid bifurcation without hemodynamically significant stenosis. --External carotid artery: No acute abnormality.  VERTEBRAL ARTERIES: Codominant configuration. Both origins are normal. The right vertebral artery is occluded distal to the origin of the right PICA but proximal to the vertebrobasilar confluence. Left vertebral artery remains patent. SKELETON: There is no bony spinal canal stenosis. No lytic or blastic lesion. OTHER NECK: Normal pharynx, larynx and major salivary glands. No cervical lymphadenopathy. Unremarkable thyroid gland. UPPER CHEST: No pneumothorax or pleural effusion. No nodules or masses. CTA HEAD FINDINGS ANTERIOR CIRCULATION: --Intracranial internal carotid arteries: Normal. --Anterior cerebral arteries: Normal. Both A1 segments are present. Patent anterior communicating artery. --Middle cerebral arteries: Normal. --Posterior communicating arteries: Absent bilaterally. POSTERIOR CIRCULATION: --Basilar artery: Patent stent within the basilar artery. --Posterior cerebral arteries: Normal. --Superior cerebellar arteries: Normal. --Inferior cerebellar arteries:  Posterior inferior cerebellar arteries are normal. The right AICA is not clearly visualized. VENOUS SINUSES: As permitted by contrast timing, patent. ANATOMIC VARIANTS: None DELAYED PHASE: No parenchymal contrast enhancement. Review of the MIP images confirms the above findings. IMPRESSION: 1. No emergent large vessel occlusion or high grade stenosis. 2. Patent basilar artery stent. 3. 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 Electronically Signed   By: Ulyses Jarred M.D.   On: 01/05/2018 17:38   Ct Head Code Stroke Wo Contrast  Result Date: 01/05/2018 CLINICAL DATA:  Code stroke. [place] headache. History of multiple strokes. EXAM: CT HEAD WITHOUT CONTRAST TECHNIQUE: Contiguous axial images were obtained from the base of the skull through the vertex without intravenous contrast. COMPARISON:  Head CT 12/03/2017 FINDINGS: Brain:  There is no mass, hemorrhage or extra-axial collection. The size and configuration of the ventricles and extra-axial CSF spaces are normal. There is no acute or chronic infarction. The brain parenchyma is normal. Vascular: Basilar artery stent. Skull: The visualized skull base, calvarium and extracranial soft tissues are normal. Sinuses/Orbits: No fluid levels or advanced mucosal thickening of the visualized paranasal sinuses. No mastoid or middle ear effusion. The orbits are normal. ASPECTS [facility] Stroke Program Early CT Score) - Ganglionic level infarction (caudate, lentiform nuclei, internal capsule, insula, M1-M3 cortex): 7 - Supraganglionic infarction (M4-M6 cortex): 3 Total score (0-10 with 10 being normal): 10 IMPRESSION: 1. No acute hemorrhage. 2. ASPECTS is 10. 3. Stented basilar artery. These results were called by telephone at the time of interpretation on 01/05/2018 at 5:09 pm to Dr. Nat Christen , who verbally acknowledged these results. Electronically Signed   By: Ulyses Jarred M.D.   On: 01/05/2018 17:09    Procedures Procedures (including critical care time)  Medications Ordered in ED Medications  LORazepam (ATIVAN) injection 2 mg (2 mg Intravenous Not Given 01/05/18 1757)  iopamidol (ISOVUE-370) 76 % injection 100 mL (100 mLs Intravenous Contrast Given 01/05/18 1707)  LORazepam (ATIVAN) injection 2 mg (2 mg Intravenous Given 01/05/18 1745)  fentaNYL (SUBLIMAZE) injection 25 mcg (25 mcg Intravenous Given 01/05/18 1905)     Initial Impression / Assessment and Plan / ED Course  I have reviewed the triage vital signs and the nursing notes.  Pertinent labs & imaging results that were available during my care of the patient were reviewed by me and considered in my medical decision making (see chart for details).     Patient presents with altered mental status and left arm shaking.  Code stroke was initiated.  CT head and CT angio head and neck showed no acute pathology.   Tele-neurologist suspected a psychogenic etiology.  Ativan IV administered.    1945: Recheck.  Patient is conversant.  Alert.  Moving all extremities.  General condition appears to have been improved.  Admit to general medicine.   CRITICAL CARE Performed by: Nat Christen Total critical care time: 45 minutes Critical care time was exclusive of separately billable procedures and treating other patients. Critical care was necessary to treat or prevent imminent or life-threatening deterioration. Critical care was time spent personally by me on the following activities: development of treatment plan with patient and/or surrogate as well as nursing, discussions with consultants, evaluation of patient's response to treatment, examination of patient, obtaining history from patient or surrogate, ordering and performing treatments and interventions, ordering and review of laboratory studies, ordering and review of radiographic studies, pulse  oximetry and re-evaluation of patient's condition.  Final Clinical Impressions(s) / ED Diagnoses   Final diagnoses:  Shaking  Altered mental status, unspecified altered mental status type    ED Discharge Orders    None       Nat Christen, MD 01/05/18 1952

## 2018-01-05 NOTE — Consult Note (Addendum)
[facility] Tele[facility]urology Consult Services   Date of Service:   01/05/2018 17:20:50  Impression:     .  RO Acute Ischemic Stroke     .  seizures should be evaluated as well.  Comments: presentation is likely psychogenic for reasons mentioned earlier, but due to risk factors both stroke and seizure should be evaluated.   Metrics: Last Known Well: 01/05/2018 16:15:00 [facility] Notification Time: 01/05/2018 17:19:53 Arrival Time: 01/05/2018 16:44:00 Stamp Time: 01/05/2018 17:20:50 Time First Login Attempt: 01/05/2018 17:25:30 Video Start Time: 01/05/2018 17:25:30  Symptoms: uncontrollable left arm shaking NIHSS Start Assessment Time: 01/05/2018 17:30:00 Patient is not a candidate for tPA.  Patient was not deemed candidate for tPA thrombolytics because of presentation appears to be volitional movements that is mostly left arm (repetitive, but both high and low amplitude, and irregular) but at times expands to both arms and legs, also episodes of eyes rolling up, during whole time he is aware and awake and can follow commands. this presentation is not consistent with a stroke and less likely to be epileptic as well. movements did not stop with ativan 2mg  IV stat until he fell asleep (and resumed right after he woke up) which is against the diagnosis of seizure.   Video End Time: 01/05/2018 17:52:12  CT head showed no acute hemorrhage or acute core infarct. CT head was reviewed.  Advanced imaging was reviewed, No Indication of Large Vessel Occlusive Thrombus. Advanced imaging CTA head and neck obtained.  ER physician notified of the decision on thrombolytics management.  Our recommendations are outlined below.  Recommendations:     .  Antiplatelet Therapy Recommended     .  consider noncontrast brain MRI.  consider EEG.  no Antiepileptic Drug yet . if studies are negative psychiatry consult can be considered.   can give IV benadryl, compazine and  toradol in the ER for headache management. avoid opioid medications.  also recommend UDS in the ER.  Routine Consultation with Inhouse Neurology for Follow up Care  Sign Out:     .  Discussed with Emergency Department Provider    ------------------------------------------------------------------------------  History of Present Illness: Patient is a 47 years old Male.  Patient was brought by EMS for symptoms of uncontrollable left arm shaking  _ Patient is a(n) 47 years old male , with history of TIA (12/01/17 MRI was negative for Acute Ischemic Stroke) with stent to basilar artery. Also cardiac stents early September. Hypothyroidism, renal insufficiency, Hyperlipidemia/Hypercholesterolemia , hypertension. started having a headache yesterday which worsened today and about 16:15 today started having uncontrollable left arm/hand shaking. In the ER has episodes of eyes rolling up and left arm shaking which he states cannot stop. Head CT: No acute Intracranial abnormality.  CT head showed no acute hemorrhage or acute core infarct. CT head was reviewed.  Last seen normal was within 4.5 hours. There is no history of hemorrhagic complications or intracranial hemorrhage. There is no history of Recent Anticoagulants. There is no history of recent major surgery. There is no history of recent stroke.  Examination: 1A: Level of Consciousness - Alert; keenly responsive + 0 1B: Ask Month and Age - Both Questions Right + 0 1C: Blink Eyes & Squeeze Hands - Performs Both Tasks + 0 2: Test Horizontal Extraocular Movements - Normal + 0 3: Test Visual Fields - No Visual Loss + 0 4: Test Facial Palsy (Use Grimace if Obtunded) - Normal symmetry + 0 5A: Test Left Arm Motor Drift - No Drift for 10  Seconds + 0 5B: Test Right Arm Motor Drift - No Drift for 10 Seconds + 0 6A: Test Left Leg Motor Drift - No Drift for 5 Seconds + 0 6B: Test Right Leg Motor Drift - No Drift for 5 Seconds + 0 7: Test Limb  Ataxia (FNF/Heel-Shin) - No Ataxia + 0 8: Test Sensation - Normal; No sensory loss + 0 9: Test Language/Aphasia - Normal; No aphasia + 0 10: Test Dysarthria - Mild-Moderate Dysarthria: Slurring but can be understood + 1 11: Test Extinction/Inattention - No abnormality + 0  NIHSS Score: 1  Patient was informed the Neurology Consult would happen via TeleHealth consult by way of interactive audio and video telecommunications and consented to receiving care in this manner.  Due to the immediate potential for life-threatening deterioration due to underlying acute neurologic illness, I spent 35 minutes providing critical care. This time includes time for face to face visit via telemedicine, review of medical records, imaging studies and discussion of findings with providers, the patient and/or family.   Dr Driscilla Grammes   [facility] [phone]

## 2018-01-05 NOTE — ED Notes (Addendum)
Patient returned from CT

## 2018-01-05 NOTE — ED Notes (Signed)
Pt being transported with RN now to CT

## 2018-01-05 NOTE — ED Notes (Signed)
Patient transported to X-ray 

## 2018-01-06 ENCOUNTER — Encounter (HOSPITAL_COMMUNITY): Payer: Self-pay | Admitting: Emergency Medicine

## 2018-01-06 ENCOUNTER — Observation Stay (HOSPITAL_COMMUNITY): Payer: Self-pay

## 2018-01-06 DIAGNOSIS — Z720 Tobacco use: Secondary | ICD-10-CM

## 2018-01-06 DIAGNOSIS — R569 Unspecified convulsions: Secondary | ICD-10-CM

## 2018-01-06 DIAGNOSIS — E782 Mixed hyperlipidemia: Secondary | ICD-10-CM

## 2018-01-06 DIAGNOSIS — E1165 Type 2 diabetes mellitus with hyperglycemia: Secondary | ICD-10-CM

## 2018-01-06 DIAGNOSIS — I5042 Chronic combined systolic (congestive) and diastolic (congestive) heart failure: Secondary | ICD-10-CM

## 2018-01-06 DIAGNOSIS — E038 Other specified hypothyroidism: Secondary | ICD-10-CM

## 2018-01-06 LAB — CBC
HEMATOCRIT: 41.4 % (ref 39.0–52.0)
Hemoglobin: 13.2 g/dL (ref 13.0–17.0)
MCH: 29.1 pg (ref 26.0–34.0)
MCHC: 31.9 g/dL (ref 30.0–36.0)
MCV: 91.2 fL (ref 80.0–100.0)
NRBC: 0 % (ref 0.0–0.2)
PLATELETS: 282 10*3/uL (ref 150–400)
RBC: 4.54 MIL/uL (ref 4.22–5.81)
RDW: 14.7 % (ref 11.5–15.5)
WBC: 10.7 10*3/uL — ABNORMAL HIGH (ref 4.0–10.5)

## 2018-01-06 LAB — COMPREHENSIVE METABOLIC PANEL
ALT: 16 U/L (ref 0–44)
ANION GAP: 10 (ref 5–15)
AST: 17 U/L (ref 15–41)
Albumin: 3.8 g/dL (ref 3.5–5.0)
Alkaline Phosphatase: 59 U/L (ref 38–126)
BILIRUBIN TOTAL: 0.5 mg/dL (ref 0.3–1.2)
BUN: 22 mg/dL — ABNORMAL HIGH (ref 6–20)
CHLORIDE: 105 mmol/L (ref 98–111)
CO2: 22 mmol/L (ref 22–32)
Calcium: 9 mg/dL (ref 8.9–10.3)
Creatinine, Ser: 1.04 mg/dL (ref 0.61–1.24)
Glucose, Bld: 176 mg/dL — ABNORMAL HIGH (ref 70–99)
POTASSIUM: 3.9 mmol/L (ref 3.5–5.1)
Sodium: 137 mmol/L (ref 135–145)
TOTAL PROTEIN: 6.8 g/dL (ref 6.5–8.1)

## 2018-01-06 LAB — GLUCOSE, CAPILLARY
GLUCOSE-CAPILLARY: 165 mg/dL — AB (ref 70–99)
GLUCOSE-CAPILLARY: 196 mg/dL — AB (ref 70–99)

## 2018-01-06 LAB — LIPID PANEL
Cholesterol: 260 mg/dL — ABNORMAL HIGH (ref 0–200)
HDL: 25 mg/dL — ABNORMAL LOW (ref >40)
LDL CALC: UNDETERMINED mg/dL (ref 0–99)
Total CHOL/HDL Ratio: 10.4 RATIO
Triglycerides: 793 mg/dL — ABNORMAL HIGH (ref <150)
VLDL: UNDETERMINED mg/dL (ref 0–40)

## 2018-01-06 LAB — CBG MONITORING, ED
GLUCOSE-CAPILLARY: 150 mg/dL — AB (ref 70–99)
GLUCOSE-CAPILLARY: 212 mg/dL — AB (ref 70–99)

## 2018-01-06 LAB — HEMOGLOBIN A1C
HEMOGLOBIN A1C: 7.9 % — AB (ref 4.8–5.6)
Mean Plasma Glucose: 180.03 mg/dL

## 2018-01-06 MED ORDER — SACUBITRIL-VALSARTAN 24-26 MG PO TABS
1.0000 | ORAL_TABLET | Freq: Two times a day (BID) | ORAL | Status: DC
  Administered 2018-01-06 – 2018-01-07 (×4): 1 via ORAL
  Filled 2018-01-06 (×6): qty 1

## 2018-01-06 MED ORDER — EZETIMIBE 10 MG PO TABS
10.0000 mg | ORAL_TABLET | Freq: Every day | ORAL | Status: DC
  Administered 2018-01-06 – 2018-01-08 (×3): 10 mg via ORAL
  Filled 2018-01-06 (×4): qty 1

## 2018-01-06 MED ORDER — LEVOTHYROXINE SODIUM 75 MCG PO TABS
175.0000 ug | ORAL_TABLET | Freq: Every day | ORAL | Status: DC
  Administered 2018-01-06 – 2018-01-08 (×3): 175 ug via ORAL
  Filled 2018-01-06: qty 4
  Filled 2018-01-06 (×2): qty 1

## 2018-01-06 MED ORDER — CITALOPRAM HYDROBROMIDE 20 MG PO TABS
10.0000 mg | ORAL_TABLET | Freq: Every day | ORAL | Status: DC
  Administered 2018-01-06 – 2018-01-08 (×3): 10 mg via ORAL
  Filled 2018-01-06 (×4): qty 1

## 2018-01-06 MED ORDER — ENOXAPARIN SODIUM 60 MG/0.6ML ~~LOC~~ SOLN
60.0000 mg | SUBCUTANEOUS | Status: DC
  Administered 2018-01-06 – 2018-01-08 (×3): 60 mg via SUBCUTANEOUS
  Filled 2018-01-06 (×3): qty 0.6

## 2018-01-06 MED ORDER — GABAPENTIN 300 MG PO CAPS
300.0000 mg | ORAL_CAPSULE | Freq: Two times a day (BID) | ORAL | Status: DC
  Administered 2018-01-06 – 2018-01-08 (×6): 300 mg via ORAL
  Filled 2018-01-06 (×6): qty 1

## 2018-01-06 MED ORDER — PANTOPRAZOLE SODIUM 40 MG PO TBEC
40.0000 mg | DELAYED_RELEASE_TABLET | Freq: Every morning | ORAL | Status: DC
  Administered 2018-01-06 – 2018-01-08 (×3): 40 mg via ORAL
  Filled 2018-01-06 (×3): qty 1

## 2018-01-06 MED ORDER — ENOXAPARIN SODIUM 40 MG/0.4ML ~~LOC~~ SOLN: 40.0000 mg | SUBCUTANEOUS | Status: DC

## 2018-01-06 MED ORDER — GLIMEPIRIDE 2 MG PO TABS
2.0000 mg | ORAL_TABLET | Freq: Every day | ORAL | Status: DC
  Administered 2018-01-06 – 2018-01-08 (×3): 2 mg via ORAL
  Filled 2018-01-06 (×4): qty 1

## 2018-01-06 MED ORDER — TICAGRELOR 90 MG PO TABS
90.0000 mg | ORAL_TABLET | Freq: Two times a day (BID) | ORAL | Status: DC
  Administered 2018-01-06 – 2018-01-08 (×6): 90 mg via ORAL
  Filled 2018-01-06 (×6): qty 1

## 2018-01-06 NOTE — ED Notes (Signed)
Spoke with [facility] about pt getting a bed assigned. No trucks available at this time. RN notified.

## 2018-01-06 NOTE — ED Notes (Signed)
Ival Bible notified for need of Entresto from main pharmacy

## 2018-01-06 NOTE — ED Notes (Signed)
Report given.  Will call RN at [facility] with update when truck available.

## 2018-01-06 NOTE — ED Notes (Signed)
Pt has removed himself from the monitor and put his regular clothes back on.  Is sitting on stool at bedside stating he going to wait for his transport to [facility] there.  Advised pt we needed to put him back on monitor and in gown and did not wish to do this.

## 2018-01-06 NOTE — ED Notes (Signed)
IV removed per pt request as it was becoming painful.

## 2018-01-06 NOTE — Progress Notes (Addendum)
PROGRESS NOTE  Devin [name]:[id number] DOB: 08/29/1970 DOA: 01/05/2018 PCP: [facility], [facility]  Brief History:  47 y/o male with history of CAD with DES x 2 placed on 11/15/17, recent TIA 11/2017, DM2, HTN, sCHF, hypothyroidism and Tobacco abuse presented by EMS from [facility] where the patient was noted to have shaking of his left upper extremity.  Apparently, the patient was acting confused at the time.  He denies any new or worsening focal extremity weakness, visual disturbance, speech difficulty or dysesthesia.  Pt has c/o daily headache since d/c from the hospital on 12/05/17.  He is unable to clarify how long his left hand shook.  He states that " I was still shaking when I got in the ambulance."  He thinks that things may have improved when he arrived in the emergency department.  The patient denies any over-the-counter NSAIDs.  He denies any loss of consciousness, bowel or bladder incontinence, or biting his tongue.  He states that he ran out of all his medications on 12/27/2017 including his antihypertensive and antiplatelet agents.  He went to the health department and [facility] on 12/28/2017.  Apparently, he states that they were able to help him ascertain all of his medications, and he restarted back on his medications on 01/01/2018.  In addition, the patient had a recent admission from 11/13/2017 through 11/16/2017 when he had unstable angina.  The patient under splint left heart catheterization on 11/15/2017 with placement of DES x2.  The patient was started on aspirin and Brilinta.  The patient was readmitted on 11/30/2017 through 12/05/2017 when he was treated for TIA.  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  He underwent stent placement of the basilar artery on 12/03/2017.  He was continued on aspirin and Brilinta.  MRI of the brain at that time showed chronic left cerebellar  infarct. During his stay in the emergency department, the patient has been afebrile hemodynamically stable saturating 99% room air.  Because of his capitate history, the patient is being transferred to [facility] neurology consultation and MRI of the brain.  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 -01/05/2018 CT angiogram head and neck--> no large vessel occlusion, or high-grade stenosis, patent basilar artery stent -10/26 CT brain negative for acute findings  Coronary artery disease -Continue aspirin and Brilinta -No chest pain presently -11/15/2017 heart catheterization--> LM patent, LAD mid 80 to 90%, LCX distal 90%, OM2 90%, LPDA 90%, RCA small with prox 99% LVgram 35-45%; DES x2 -Personally reviewed EKG--sinus rhythm, nonspecific T wave changes  Chronic systolic and diastolic CHF -11/23/2017 echo EF 35-40%, diffuse HK, grade 2 DD -Patient started on Entresto 11/26/17 by cardiology -Appears clinically euvolemic -bradycardia limited ability to use BB previously  Diabetes mellitus type 2, uncontrolled with hyperglycemia -Holding Amaryl -12/01/2017 hemoglobin A1c 8.5 -NovoLog sliding scale  Hypothyroidism -11/13/2017 TSH 84.91 -Patient started on Synthroid 125 mcg daily -01/05/18 TSH 60.65 -Increase Synthroid to 175 mcg daily  Diabetic polyneuropathy -Continue gabapentin  Hyperlipidemia -Patient developed rhabdomyolysis on Crestor -Continue Zetia  Tobacco abuse -still smoking 5 cigarette's /day I have discussed tobacco cessation with the patient.  I have counseled the patient regarding the negative impacts of continued tobacco use including but not limited  to lung cancer, COPD, and cardiovascular disease.  I have discussed alternatives to tobacco and modalities that may help facilitate tobacco cessation including but not limited to biofeedback, hypnosis, and medications.  Total time spent with tobacco counseling was 4 minutes.    Disposition Plan:   Transfer to [facility] Family Communication:   Family at bedside  Consultants:  neurology  Code Status:  FULL   DVT Prophylaxis:  [place] Lovenox   Procedures: As Listed in Progress Note Above  Antibiotics: None    Subjective: Patient denies fevers, chills, headache, chest pain, dyspnea, nausea, vomiting, diarrhea, abdominal pain, dysuria, hematuria, hematochezia, and melena.   Objective: Vitals:   01/06/18 0300 01/06/18 0430 01/06/18 0500 01/06/18 0600  BP: (!) 133/99 (!) 129/94 (!) 149/89 135/88  Pulse: 78 73 74 77  Resp: 19 18 20 20   SpO2: 94% 97% 99% 100%    Intake/Output Summary (Last 24 hours) at 01/06/2018 0710 Last data filed at 01/05/2018 2354 Gross per 24 hour  Intake 500 ml  Output -  Net 500 ml   Weight change:  Exam:   General:  Pt is alert, follows commands appropriately, not in acute distress  HEENT: No icterus, No thrush, No neck mass, [place]/AT  Cardiovascular: RRR, S1/S2, no rubs, no gallops  Respiratory: CTA bilaterally, no wheezing, no crackles, no rhonchi  Abdomen: Soft/+BS, non tender, non distended, no guarding  Extremities: No edema, No lymphangitis, No petechiae, No rashes, no synovitis  -Neuro:  CN II-XII intact, strength 4/5 in RUE, RLE, strength 4/5 LUE, LLE; sensation intact bilateral; no dysmetria; babinski equivocal     Data Reviewed: I have personally reviewed following labs and imaging studies Basic Metabolic Panel: Recent Labs  Lab 01/05/18 1654 01/05/18 1701 01/06/18 0556  NA 137 138 137  K 4.1 4.3 3.9  CL 102 103 105  CO2 24  --  22  GLUCOSE 256* 259* 176*  BUN 16 20 22*  CREATININE 1.19 1.20 1.04  CALCIUM 9.5  --  9.0     Liver Function Tests: Recent Labs  Lab 01/05/18 1654 01/06/18 0556  AST 22 17  ALT 19 16  ALKPHOS 60 59  BILITOT 0.6 0.5  PROT 7.6 6.8  ALBUMIN 4.3 3.8   No results for input(s): LIPASE, AMYLASE in the last 168 hours. No results for input(s): AMMONIA in the last 168 hours. Coagulation Profile: Recent Labs  Lab 01/05/18 1654  INR 0.93   CBC: Recent Labs  Lab 01/05/18 1654 01/05/18 1701 01/06/18 0556  WBC 15.5*  --  10.7*  NEUTROABS 10.1*  --   --   HGB 13.7 14.3 13.2  HCT 43.8 42.0 41.4  MCV 91.4  --  91.2  PLT 308  --  282   Cardiac Enzymes: No results for input(s): CKTOTAL, CKMB, CKMBINDEX, TROPONINI in the last 168 hours. BNP: Invalid input(s): POCBNP CBG: Recent Labs  Lab 01/05/18 1651 01/05/18 2135  GLUCAP 271* 151*   HbA1C: No results for input(s): HGBA1C in the last 72 hours. Urine analysis:    Component Value Date/Time   COLORURINE YELLOW 01/05/2018 1652   APPEARANCEUR CLEAR 01/05/2018 1652   LABSPEC >1.046 (H) 01/05/2018 1652   PHURINE 6.0 01/05/2018 1652   GLUCOSEU NEGATIVE 01/05/2018 1652   HGBUR NEGATIVE 01/05/2018 1652   BILIRUBINUR NEGATIVE 01/05/2018 1652   KETONESUR NEGATIVE 01/05/2018 1652   PROTEINUR NEGATIVE 01/05/2018 1652  UROBILINOGEN 0.2 10/06/2013 0305   NITRITE NEGATIVE 01/05/2018 1652   LEUKOCYTESUR NEGATIVE 01/05/2018 1652   Sepsis Labs: @LABRCNTIP (procalcitonin:4,lacticidven:4) )No results found for this or any previous visit (from the past 240 hour(s)).   Scheduled Meds: .  stroke: mapping our early stages of recovery book   Does not apply Once  . aspirin  300 mg Rectal Daily   Or  . aspirin  325 mg Oral Daily  . citalopram  10 mg Oral Daily  . ezetimibe  10 mg Oral Daily  . gabapentin  300 mg Oral BID  . glimepiride  2 mg Oral Q breakfast  . insulin aspart  0-5 Units Subcutaneous QHS  . insulin aspart  0-9 Units Subcutaneous TID WC  . levothyroxine  175 mcg Oral QAC breakfast  . LORazepam  2 mg Intravenous  STAT  . pantoprazole  40 mg Oral q morning - 10a  . sacubitril-valsartan  1 tablet Oral BID  . ticagrelor  90 mg Oral BID   Continuous Infusions:  Procedures/Studies: Ct Angio Head W Or Wo Contrast  Result Date: 01/05/2018 CLINICAL DATA:  Altered mental status EXAM: CT ANGIOGRAPHY HEAD AND NECK TECHNIQUE: Multidetector CT imaging of the head and neck was performed using the standard protocol during bolus administration of intravenous contrast. Multiplanar CT image reconstructions and MIPs were obtained to evaluate the vascular anatomy. Carotid stenosis measurements (when applicable) are obtained utilizing NASCET criteria, using the distal internal carotid diameter as the denominator. CONTRAST:  ISOVUE-370 IOPAMIDOL (ISOVUE-370) INJECTION 76% COMPARISON:  Head CT 01/05/2018 and 11/30/2017 FINDINGS: CTA NECK FINDINGS AORTIC ARCH: There is no calcific atherosclerosis of the aortic arch. There is no aneurysm, dissection or hemodynamically significant stenosis of the visualized ascending aorta and aortic arch. Conventional 3 vessel aortic branching pattern. The visualized proximal subclavian arteries are widely patent. RIGHT CAROTID SYSTEM: --Common carotid artery: Widely patent origin without common carotid artery dissection or aneurysm. --Internal carotid artery: No dissection, occlusion or aneurysm. No hemodynamically significant stenosis. --External carotid artery: No acute abnormality. LEFT CAROTID SYSTEM: --Common carotid artery: Widely patent origin without common carotid artery dissection or aneurysm. --Internal carotid artery:No dissection, occlusion or aneurysm. Mild atherosclerotic calcification at the carotid bifurcation without hemodynamically significant stenosis. --External carotid artery: No acute abnormality. VERTEBRAL ARTERIES: Codominant configuration. Both origins are normal. The right vertebral artery is occluded distal to the origin of the right PICA but proximal to the  vertebrobasilar confluence. Left vertebral artery remains patent. SKELETON: There is no bony spinal canal stenosis. No lytic or blastic lesion. OTHER NECK: Normal pharynx, larynx and major salivary glands. No cervical lymphadenopathy. Unremarkable thyroid gland. UPPER CHEST: No pneumothorax or pleural effusion. No nodules or masses. CTA HEAD FINDINGS ANTERIOR CIRCULATION: --Intracranial internal carotid arteries: Normal. --Anterior cerebral arteries: Normal. Both A1 segments are present. Patent anterior communicating artery. --Middle cerebral arteries: Normal. --Posterior communicating arteries: Absent bilaterally. POSTERIOR CIRCULATION: --Basilar artery: Patent stent within the basilar artery. --Posterior cerebral arteries: Normal. --Superior cerebellar arteries: Normal. --Inferior cerebellar arteries: Posterior inferior cerebellar arteries are normal. The right AICA is not clearly visualized. VENOUS SINUSES: As permitted by contrast timing, patent. ANATOMIC VARIANTS: None DELAYED PHASE: No parenchymal contrast enhancement. Review of the MIP images confirms the above findings. IMPRESSION: 1. No emergent large vessel occlusion or high grade stenosis. 2. Patent basilar artery stent. 3. 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 Electronically Signed   By: Caryn Bee  Chase Picket M.D.   On: 01/05/2018 17:38   Dg Chest 2 View  Result Date: 01/05/2018 CLINICAL DATA:  Altered mental status EXAM: CHEST - 2 VIEW COMPARISON:  Chest CT 11/30/2017 FINDINGS: The heart size and mediastinal contours are within normal limits. Both lungs are clear. The visualized skeletal structures are unremarkable. IMPRESSION: No active cardiopulmonary disease. Electronically Signed   By: Deatra Robinson M.D.   On: 01/05/2018 21:20   Ct Angio Neck W And/or Wo Contrast  Result Date: 01/05/2018 CLINICAL DATA:  Altered mental status EXAM: CT  ANGIOGRAPHY HEAD AND NECK TECHNIQUE: Multidetector CT imaging of the head and neck was performed using the standard protocol during bolus administration of intravenous contrast. Multiplanar CT image reconstructions and MIPs were obtained to evaluate the vascular anatomy. Carotid stenosis measurements (when applicable) are obtained utilizing NASCET criteria, using the distal internal carotid diameter as the denominator. CONTRAST:  ISOVUE-370 IOPAMIDOL (ISOVUE-370) INJECTION 76% COMPARISON:  Head CT 01/05/2018 and 11/30/2017 FINDINGS: CTA NECK FINDINGS AORTIC ARCH: There is no calcific atherosclerosis of the aortic arch. There is no aneurysm, dissection or hemodynamically significant stenosis of the visualized ascending aorta and aortic arch. Conventional 3 vessel aortic branching pattern. The visualized proximal subclavian arteries are widely patent. RIGHT CAROTID SYSTEM: --Common carotid artery: Widely patent origin without common carotid artery dissection or aneurysm. --Internal carotid artery: No dissection, occlusion or aneurysm. No hemodynamically significant stenosis. --External carotid artery: No acute abnormality. LEFT CAROTID SYSTEM: --Common carotid artery: Widely patent origin without common carotid artery dissection or aneurysm. --Internal carotid artery:No dissection, occlusion or aneurysm. Mild atherosclerotic calcification at the carotid bifurcation without hemodynamically significant stenosis. --External carotid artery: No acute abnormality. VERTEBRAL ARTERIES: Codominant configuration. Both origins are normal. The right vertebral artery is occluded distal to the origin of the right PICA but proximal to the vertebrobasilar confluence. Left vertebral artery remains patent. SKELETON: There is no bony spinal canal stenosis. No lytic or blastic lesion. OTHER NECK: Normal pharynx, larynx and major salivary glands. No cervical lymphadenopathy. Unremarkable thyroid gland. UPPER CHEST: No pneumothorax  or pleural effusion. No nodules or masses. CTA HEAD FINDINGS ANTERIOR CIRCULATION: --Intracranial internal carotid arteries: Normal. --Anterior cerebral arteries: Normal. Both A1 segments are present. Patent anterior communicating artery. --Middle cerebral arteries: Normal. --Posterior communicating arteries: Absent bilaterally. POSTERIOR CIRCULATION: --Basilar artery: Patent stent within the basilar artery. --Posterior cerebral arteries: Normal. --Superior cerebellar arteries: Normal. --Inferior cerebellar arteries: Posterior inferior cerebellar arteries are normal. The right AICA is not clearly visualized. VENOUS SINUSES: As permitted by contrast timing, patent. ANATOMIC VARIANTS: None DELAYED PHASE: No parenchymal contrast enhancement. Review of the MIP images confirms the above findings. IMPRESSION: 1. No emergent large vessel occlusion or high grade stenosis. 2. Patent basilar artery stent. 3. 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 Electronically Signed   By: Deatra Robinson M.D.   On: 01/05/2018 17:38   Ct Head Code Stroke Wo Contrast  Result Date: 01/05/2018 CLINICAL DATA:  Code stroke. [place] headache. History of multiple strokes. EXAM: CT HEAD WITHOUT CONTRAST TECHNIQUE: Contiguous axial images were obtained from the base of the skull through the vertex without intravenous contrast. COMPARISON:  Head CT 12/03/2017 FINDINGS: Brain: There is no mass, hemorrhage or extra-axial collection. The size and configuration of the ventricles and extra-axial CSF spaces are normal. There is no acute or chronic infarction. The brain parenchyma is normal. Vascular: Basilar artery stent. Skull: The visualized  skull base, calvarium and extracranial soft tissues are normal. Sinuses/Orbits: No fluid levels or advanced mucosal thickening of the visualized paranasal sinuses. No mastoid or middle ear effusion.  The orbits are normal. ASPECTS [facility] Stroke Program Early CT Score) - Ganglionic level infarction (caudate, lentiform nuclei, internal capsule, insula, M1-M3 cortex): 7 - Supraganglionic infarction (M4-M6 cortex): 3 Total score (0-10 with 10 being normal): 10 IMPRESSION: 1. No acute hemorrhage. 2. ASPECTS is 10. 3. Stented basilar artery. These results were called by telephone at the time of interpretation on 01/05/2018 at 5:09 pm to Dr. Donnetta Hutching , who verbally acknowledged these results. Electronically Signed   By: Deatra Robinson M.D.   On: 01/05/2018 17:09    Catarina Hartshorn, DO  [facility] Pager [phone]  If 7PM-7AM, please contact night-coverage [email or web address] Password [email or web address] 01/06/2018, 7:10 AM   LOS: 0 days

## 2018-01-06 NOTE — Progress Notes (Signed)
Received patient from [facility] into 5w bed 15. Patient complaining of 4/10 aching/dull right sided headache. Otherwise, patient with no complaints. Patient placed on telemetry monitor. Will continue to monitor.

## 2018-01-06 NOTE — ED Notes (Signed)
Pt's glucose is elevated as he has a bag of candy in his room and states he has been eating it all night.  Advised to not do this, but pt just laughed.  States his sister is bringing him breakfast from [place] and he does not want hospital food.

## 2018-01-07 ENCOUNTER — Telehealth: Payer: Self-pay | Admitting: Adult Health

## 2018-01-07 ENCOUNTER — Telehealth: Payer: Self-pay | Admitting: Cardiology

## 2018-01-07 ENCOUNTER — Other Ambulatory Visit (HOSPITAL_COMMUNITY): Payer: Self-pay

## 2018-01-07 ENCOUNTER — Telehealth: Payer: Self-pay

## 2018-01-07 ENCOUNTER — Inpatient Hospital Stay (HOSPITAL_COMMUNITY): Payer: Self-pay

## 2018-01-07 DIAGNOSIS — R410 Disorientation, unspecified: Secondary | ICD-10-CM

## 2018-01-07 DIAGNOSIS — R4182 Altered mental status, unspecified: Secondary | ICD-10-CM

## 2018-01-07 DIAGNOSIS — Z8673 Personal history of transient ischemic attack (TIA), and cerebral infarction without residual deficits: Secondary | ICD-10-CM

## 2018-01-07 LAB — CBC
HCT: 42.6 % (ref 39.0–52.0)
Hemoglobin: 13.6 g/dL (ref 13.0–17.0)
MCH: 28.7 pg (ref 26.0–34.0)
MCHC: 31.9 g/dL (ref 30.0–36.0)
MCV: 89.9 fL (ref 80.0–100.0)
NRBC: 0 % (ref 0.0–0.2)
PLATELETS: 325 10*3/uL (ref 150–400)
RBC: 4.74 MIL/uL (ref 4.22–5.81)
RDW: 14.3 % (ref 11.5–15.5)
WBC: 13.1 10*3/uL — ABNORMAL HIGH (ref 4.0–10.5)

## 2018-01-07 LAB — BASIC METABOLIC PANEL
ANION GAP: 7 (ref 5–15)
BUN: 14 mg/dL (ref 6–20)
CO2: 25 mmol/L (ref 22–32)
CREATININE: 1.23 mg/dL (ref 0.61–1.24)
Calcium: 9.5 mg/dL (ref 8.9–10.3)
Chloride: 105 mmol/L (ref 98–111)
GFR calc Af Amer: >60 mL/min (ref >60)
GLUCOSE: 199 mg/dL — AB (ref 70–99)
Potassium: 4.2 mmol/L (ref 3.5–5.1)
Sodium: 137 mmol/L (ref 135–145)

## 2018-01-07 LAB — GLUCOSE, CAPILLARY
GLUCOSE-CAPILLARY: 148 mg/dL — AB (ref 70–99)
GLUCOSE-CAPILLARY: 141 mg/dL — AB (ref 70–99)
Glucose-Capillary: 165 mg/dL — ABNORMAL HIGH (ref 70–99)
Glucose-Capillary: 137 mg/dL — ABNORMAL HIGH (ref 70–99)

## 2018-01-07 LAB — PLATELET INHIBITION P2Y12: Platelet Function  P2Y12: 46 [PRU] — ABNORMAL LOW (ref 194–418)

## 2018-01-07 MED ORDER — GEMFIBROZIL 600 MG PO TABS
600.0000 mg | ORAL_TABLET | Freq: Two times a day (BID) | ORAL | Status: DC
  Administered 2018-01-07 – 2018-01-08 (×2): 600 mg via ORAL
  Filled 2018-01-07 (×3): qty 1

## 2018-01-07 MED ORDER — HYDROCODONE-ACETAMINOPHEN 5-325 MG PO TABS: 1.0000 | ORAL_TABLET | Freq: Four times a day (QID) | ORAL | Status: DC | PRN

## 2018-01-07 MED ORDER — ACETAMINOPHEN-CODEINE #3 300-30 MG PO TABS
2.0000 | ORAL_TABLET | ORAL | Status: DC | PRN
  Administered 2018-01-07: 2 via ORAL
  Filled 2018-01-07 (×2): qty 2

## 2018-01-07 NOTE — Procedures (Signed)
ELECTROENCEPHALOGRAM REPORT   Patient: Devin [name]       Room #: 8B01B EEG No. ID: [id number] Age: 47 y.o.        Sex: male Referring Physician: Regalado Report Date:  01/07/2018        Interpreting Physician: Thana Farr  History: KAYDENCE BABA is an 47 y.o. male with episode of LUE shaking  Medications:  ASA, Celexa, Zetia, Neurontin, Lopid, Amaryl, Insulin, Synthroid, Protonix, Brilinta  Conditions of Recording:  This is a 21 channel routine scalp EEG performed with bipolar and monopolar montages arranged in accordance to the international 10/20 system of electrode placement. One channel was dedicated to EKG recording.  The patient is in the awake and drowsy states.  Description:  The waking background activity consists of a low voltage, symmetrical, fairly well organized, 9 Hz alpha activity, seen from the parieto-occipital and posterior temporal regions.  Low voltage fast activity, poorly organized, is seen anteriorly and is at times superimposed on more posterior regions.  A mixture of theta and alpha rhythms are seen from the central and temporal regions. The patient drowses with slowing to irregular, low voltage theta and beta activity.   Stage II sleep is not obtained. No epileptiform activity is noted.   Hyperventilation and intermittent photic stimulation were not performed.   IMPRESSION: Normal electroencephalogram, awake and drowsy . There are no focal lateralizing or epileptiform features.   Comment:  An EEG with the patient sleep deprived to elicit drowse and light sleep may be desirable to further elicit a possible seizure disorder.    Thana Farr, MD Neurology [phone] 01/07/2018, 6:27 PM

## 2018-01-07 NOTE — Telephone Encounter (Signed)
Pt was already approved 10/25 for entresto free through pt assistance, Pt ID: [id number]. Apparently two nurses aplied for pt, he has no insurance and was approved. The call from [place] at [employer] wanted medicare d info (pt has no insurance)

## 2018-01-07 NOTE — Evaluation (Signed)
Occupational Therapy Evaluation and Discharge Patient Details Name: Devin [name] MRN: [id number] DOB: 1970/06/08 Today's Date: 01/07/2018    History of Present Illness 47 y/o male with history of CAD with DES x 2 placed on 11/15/17, recent TIA 11/2017, DM2, HTN, sCHF, hypothyroidism and Tobacco abuse presented by EMS from [facility] where the patient was noted to have shaking of his left upper extremity.  Apparently, the patient was acting confused at the time.  He denies any new or worsening focal extremity weakness, visual disturbance, speech difficulty or dysesthesia.  Pt has c/o daily headache since d/c from the hospital on 12/05/17.  He is unable to clarify how long his left hand shook..  CT negative for acute event.   Clinical Impression   Pt currently at basline with mod I for functional tasks and mobility. Per pt and staff report, pt bathing while standing in shower and dressing without assistance prior to OT arrival. Pt able to obtain needed items to make bed and demonstrated task without LOB. Pt with no further concerns. Pt does not need OT intervention at this time. OT SIGNING OFF. Thank you for referral.     Follow Up Recommendations  No OT follow up    Equipment Recommendations  None recommended by OT    Recommendations for Other Services (none)     Precautions / Restrictions Precautions Precautions: None      Mobility Bed Mobility Overal bed mobility: Independent       Transfers Overall transfer level: Modified independent     General transfer comment: use UE    Balance Overall balance assessment: No apparent balance deficits (not formally assessed)       Standardized Balance Assessment Standardized Balance Assessment : Dynamic Gait Index   Dynamic Gait Index Level Surface: Normal Change in Gait Speed: Normal Gait with Horizontal Head Turns: Normal Gait with Vertical Head Turns: Normal Gait and Pivot Turn: Normal Step Over Obstacle: Mild Impairment Step  Around Obstacles: Mild Impairment Steps: Normal Total Score: 22     ADL either performed or assessed with clinical judgement   ADL Overall ADL's : At baseline;Independent         Vision Baseline Vision/History: No visual deficits              Pertinent Vitals/Pain Pain Assessment: No/denies pain Faces Pain Scale: Hurts a little bit Pain Location: head Pain Descriptors / Indicators: Aching Pain Intervention(s): Monitored during session     Hand Dominance Right   Extremity/Trunk Assessment Upper Extremity Assessment Upper Extremity Assessment: Overall WFL for tasks assessed   Lower Extremity Assessment Lower Extremity Assessment: Overall WFL for tasks assessed   Cervical / Trunk Assessment Cervical / Trunk Assessment: Normal   Communication Communication Communication: No difficulties   Cognition Arousal/Alertness: Awake/alert Behavior During Therapy: WFL for tasks assessed/performed Overall Cognitive Status: Within Functional Limits for tasks assessed                      Home Living Family/patient expects to be discharged to:: Private residence Living Arrangements: Alone Available Help at Discharge: Family;Friend(s);Available PRN/intermittently Type of Home: Mobile home Home Access: Stairs to enter Entrance Stairs-Number of Steps: 5 Entrance Stairs-Rails: Right Home Layout: One level     Bathroom Shower/Tub: Producer, television/film/video: Standard     Home Equipment: Environmental consultant - 2 wheels;Cane - single point;Crutches;Bedside commode          Prior Functioning/Environment Level of Independence: Independent  OT Goals(Current goals can be found in the care plan section) Acute Rehab OT Goals Patient Stated Goal: Ready to get home OT Goal Formulation: With patient Time For Goal Achievement: 01/21/18 Potential to Achieve Goals: Good          AM-PAC PT "6 Clicks" Daily Activity     Outcome Measure Help from  another person eating meals?: None Help from another person taking care of personal grooming?: None Help from another person toileting, which includes using toliet, bedpan, or urinal?: None Help from another person bathing (including washing, rinsing, drying)?: None Help from another person to put on and taking off regular upper body clothing?: None Help from another person to put on and taking off regular lower body clothing?: None 6 Click Score: 24   End of Session    Activity Tolerance: Patient tolerated treatment well Patient left: in chair  OT Visit Diagnosis: Muscle weakness (generalized) (M62.81)                Time: 1105-1120 OT Time Calculation (min): 15 min Charges:  OT General Charges $OT Visit: 1 Visit OT Evaluation $OT Eval Low Complexity: 1 Low   Cigi Bega P, MS, OTR/L 01/07/2018, 11:49 AM

## 2018-01-07 NOTE — Telephone Encounter (Signed)
Sent referral to [facility] Telephone [phone]- fax [phone] . Called and left patient a message.

## 2018-01-07 NOTE — Progress Notes (Signed)
PROGRESS NOTE    Devin [name]  ZOX:[id number] DOB: 1970-08-03 DOA: 01/05/2018 PCP: [facility]    Brief Narrative: 47 y/o male with history of CAD with DES x 2 placed on 11/15/17, recent TIA 11/2017, DM2, HTN, sCHF, hypothyroidism and Tobacco abuse presented by EMS from [facility] where the patient was noted to have shaking of his left upper extremity.  Apparently, the patient was acting confused at the time.  He denies any new or worsening focal extremity weakness, visual disturbance, speech difficulty or dysesthesia.  Pt has c/o daily headache since d/c from the hospital on 12/05/17.  He is unable to clarify how long his left hand shook.  He states that " I was still shaking when I got in the ambulance."  He thinks that things may have improved when he arrived in the emergency department.  The patient denies any over-the-counter NSAIDs.  He denies any loss of consciousness, bowel or bladder incontinence, or biting his tongue.  He states that he ran out of all his medications on 12/27/2017 including his antihypertensive and antiplatelet agents.  He went to the health department and [facility] on 12/28/2017.  Apparently, he states that they were able to help him ascertain all of his medications, and he restarted back on his medications on 01/01/2018.  In addition, the patient had a recent admission from 11/13/2017 through 11/16/2017 when he had unstable angina.  The patient under splint left heart catheterization on 11/15/2017 with placement of DES x2.  The patient was started on aspirin and Brilinta.  The patient was readmitted on 11/30/2017 through 12/05/2017 when he was treated for TIA.  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  He underwent stent placement of the basilar artery on 12/03/2017.  He was continued on aspirin and Brilinta.  MRI of the brain at that time showed chronic left cerebellar  infarct. During his stay in the emergency department, the patient has been afebrile hemodynamically stable saturating 99% room air.  Because of his capitate history, the patient is being transferred to [facility] neurology consultation and MRI of the brain.   Assessment & Plan:   Principal Problem:   Altered mental status Active Problems:   Tobacco abuse   Hypertension   Hyperlipidemia   Hypothyroidism   Type 2 diabetes mellitus without complications (HCC)   History of stroke   Uncontrolled type 2 diabetes mellitus with hyperglycemia (HCC)   Seizure-like activity (HCC)   Chronic combined systolic and diastolic CHF (congestive heart failure) (HCC)  1-Acute stroke.  Presents with balance problem and  abnormal movement of left arm.  MRI positive for multiples stroke posterior circulation.  Will order ECHO On brillinta and aspirin.  Will follow neurology recommendation.  Hold entresto.  Triglyceride 793 start lopid. Does not tolerates statins history of rhabdomyolysis.  EEG ordered.  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  Compensated.  Hold entresto due to orthostatic vital positive and dizziness.  Unable to use BB due to bradycardia.   3-CAD;  On aspirin and brilinta.  -11/15/2017 heart catheterization--> LM patent, LAD mid 80 to 90%, LCX distal 90%, OM2 90%, LPDA 90%, RCA small with prox 99% LVgram 35-45%; DES x2  Hypothyroidism;  TSH at 60 Synthroid increase  to 175 this admission  Diabetic polyneuropathy -Continue gabapentin  Hyperlipidemia -Patient developed rhabdomyolysis on Crestor -Continue Zetia. -add lopid due to hypertriglyceridemia.      DVT prophylaxis: lovenox Code Status: full code.  Family Communication: no family at bedside.    Disposition Plan: remain in the hospital for management and work up of stroke   Consultants:   Neurology    Procedures:   ECHO pending.    Antimicrobials:   none   Subjective: He is feeling ok.  Report balance problems.  No further left Upper extremity involuntary movement.   Objective: Vitals:   01/06/18 1500 01/06/18 1654 01/06/18 2129 01/07/18 0409  BP: (!) 122/92 132/90 (!) 154/93 124/81  Pulse: 71 75 62 76  Resp: 16  (!) 24 18  Temp:  98.4 F (36.9 C) 98.3 F (36.8 C) 98.4 F (36.9 C)  TempSrc:  Oral Oral Oral  SpO2: 97% 97% 99% 99%  Weight:    124.3 kg   No intake or output data in the 24 hours ending 01/07/18 1014 Filed Weights   01/07/18 0409  Weight: 124.3 kg    Examination:  General exam: Appears calm and comfortable  Respiratory system: Clear to auscultation. Respiratory effort normal. Cardiovascular system: S1 & S2 heard, RRR. No JVD, murmurs, rubs, gallops or clicks. No pedal edema. Gastrointestinal system: Abdomen is nondistended, soft and nontender. No organomegaly or masses felt. Normal bowel sounds heard. Central nervous system: Alert and oriented. No focal neurological deficits. Extremities: Symmetric 5 x 5 power. Skin: No rashes, lesions or ulcers Psychiatry: Judgement and insight appear normal. Mood & affect appropriate.     Data Reviewed: I have personally reviewed following labs and imaging studies  CBC: Recent Labs  Lab 01/05/18 1654 01/05/18 1701 01/06/18 0556  WBC 15.5*  --  10.7*  NEUTROABS 10.1*  --   --   HGB 13.7 14.3 13.2  HCT 43.8 42.0 41.4  MCV 91.4  --  91.2  PLT 308  --  282   Basic Metabolic Panel: Recent Labs  Lab 01/05/18 1654 01/05/18 1701 01/06/18 0556  NA 137 138 137  K 4.1 4.3 3.9  CL 102 103 105  CO2 24  --  22  GLUCOSE 256* 259* 176*  BUN 16 20 22*  CREATININE 1.19 1.20 1.04  CALCIUM 9.5  --  9.0   GFR: Estimated Creatinine Clearance: 132.7 mL/min (by C-G formula based on SCr of 1.04  mg/dL). Liver Function Tests: Recent Labs  Lab 01/05/18 1654 01/06/18 0556  AST 22 17  ALT 19 16  ALKPHOS 60 59  BILITOT 0.6 0.5  PROT 7.6 6.8  ALBUMIN 4.3 3.8   No results for input(s): LIPASE, AMYLASE in the last 168 hours. No results for input(s): AMMONIA in the last 168 hours. Coagulation Profile: Recent Labs  Lab 01/05/18 1654  INR 0.93   Cardiac Enzymes: No results for input(s): CKTOTAL, CKMB, CKMBINDEX, TROPONINI in the last 168 hours. BNP (last 3 results) No results for input(s): PROBNP in the last 8760 hours. HbA1C: Recent Labs    01/06/18 0556  HGBA1C 7.9*   CBG: Recent Labs  Lab 01/06/18 0756 01/06/18 1225 01/06/18 1814 01/06/18 2337 01/07/18 0807  GLUCAP 150* 212* 165* 196* 148*   Lipid Profile: Recent Labs    01/06/18 0556  CHOL 260*  HDL 25*  LDLCALC UNABLE TO CALCULATE IF TRIGLYCERIDE OVER 400 mg/dL  TRIG 161*  CHOLHDL 09.6   Thyroid Function Tests: Recent Labs    01/05/18  1654  TSH 60.657*   Anemia Panel: No results for input(s): VITAMINB12, FOLATE, FERRITIN, TIBC, IRON, RETICCTPCT in the last 72 hours. Sepsis Labs: No results for input(s): PROCALCITON, LATICACIDVEN in the last 168 hours.  No results found for this or any previous visit (from the past 240 hour(s)).       Radiology Studies: Ct Angio Head W Or Wo Contrast  Result Date: 01/05/2018 CLINICAL DATA:  Altered mental status EXAM: CT ANGIOGRAPHY HEAD AND NECK TECHNIQUE: Multidetector CT imaging of the head and neck was performed using the standard protocol during bolus administration of intravenous contrast. Multiplanar CT image reconstructions and MIPs were obtained to evaluate the vascular anatomy. Carotid stenosis measurements (when applicable) are obtained utilizing NASCET criteria, using the distal internal carotid diameter as the denominator. CONTRAST:  ISOVUE-370 IOPAMIDOL (ISOVUE-370) INJECTION 76% COMPARISON:  Head CT 01/05/2018 and 11/30/2017 FINDINGS: CTA  NECK FINDINGS AORTIC ARCH: There is no calcific atherosclerosis of the aortic arch. There is no aneurysm, dissection or hemodynamically significant stenosis of the visualized ascending aorta and aortic arch. Conventional 3 vessel aortic branching pattern. The visualized proximal subclavian arteries are widely patent. RIGHT CAROTID SYSTEM: --Common carotid artery: Widely patent origin without common carotid artery dissection or aneurysm. --Internal carotid artery: No dissection, occlusion or aneurysm. No hemodynamically significant stenosis. --External carotid artery: No acute abnormality. LEFT CAROTID SYSTEM: --Common carotid artery: Widely patent origin without common carotid artery dissection or aneurysm. --Internal carotid artery:No dissection, occlusion or aneurysm. Mild atherosclerotic calcification at the carotid bifurcation without hemodynamically significant stenosis. --External carotid artery: No acute abnormality. VERTEBRAL ARTERIES: Codominant configuration. Both origins are normal. The right vertebral artery is occluded distal to the origin of the right PICA but proximal to the vertebrobasilar confluence. Left vertebral artery remains patent. SKELETON: There is no bony spinal canal stenosis. No lytic or blastic lesion. OTHER NECK: Normal pharynx, larynx and major salivary glands. No cervical lymphadenopathy. Unremarkable thyroid gland. UPPER CHEST: No pneumothorax or pleural effusion. No nodules or masses. CTA HEAD FINDINGS ANTERIOR CIRCULATION: --Intracranial internal carotid arteries: Normal. --Anterior cerebral arteries: Normal. Both A1 segments are present. Patent anterior communicating artery. --Middle cerebral arteries: Normal. --Posterior communicating arteries: Absent bilaterally. POSTERIOR CIRCULATION: --Basilar artery: Patent stent within the basilar artery. --Posterior cerebral arteries: Normal. --Superior cerebellar arteries: Normal. --Inferior cerebellar arteries: Posterior inferior  cerebellar arteries are normal. The right AICA is not clearly visualized. VENOUS SINUSES: As permitted by contrast timing, patent. ANATOMIC VARIANTS: None DELAYED PHASE: No parenchymal contrast enhancement. Review of the MIP images confirms the above findings. IMPRESSION: 1. No emergent large vessel occlusion or high grade stenosis. 2. Patent basilar artery stent. 3. 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 Electronically Signed   By: Deatra Robinson M.D.   On: 01/05/2018 17:38   Dg Chest 2 View  Result Date: 01/05/2018 CLINICAL DATA:  Altered mental status EXAM: CHEST - 2 VIEW COMPARISON:  Chest CT 11/30/2017 FINDINGS: The heart size and mediastinal contours are within normal limits. Both lungs are clear. The visualized skeletal structures are unremarkable. IMPRESSION: No active cardiopulmonary disease. Electronically Signed   By: Deatra Robinson M.D.   On: 01/05/2018 21:20   Ct Angio Neck W And/or Wo Contrast  Result Date: 01/05/2018 CLINICAL DATA:  Altered mental status EXAM: CT ANGIOGRAPHY HEAD AND NECK TECHNIQUE: Multidetector CT imaging of the head and neck was performed using the standard protocol during bolus administration  of intravenous contrast. Multiplanar CT image reconstructions and MIPs were obtained to evaluate the vascular anatomy. Carotid stenosis measurements (when applicable) are obtained utilizing NASCET criteria, using the distal internal carotid diameter as the denominator. CONTRAST:  ISOVUE-370 IOPAMIDOL (ISOVUE-370) INJECTION 76% COMPARISON:  Head CT 01/05/2018 and 11/30/2017 FINDINGS: CTA NECK FINDINGS AORTIC ARCH: There is no calcific atherosclerosis of the aortic arch. There is no aneurysm, dissection or hemodynamically significant stenosis of the visualized ascending aorta and aortic arch. Conventional 3 vessel aortic branching pattern. The visualized proximal  subclavian arteries are widely patent. RIGHT CAROTID SYSTEM: --Common carotid artery: Widely patent origin without common carotid artery dissection or aneurysm. --Internal carotid artery: No dissection, occlusion or aneurysm. No hemodynamically significant stenosis. --External carotid artery: No acute abnormality. LEFT CAROTID SYSTEM: --Common carotid artery: Widely patent origin without common carotid artery dissection or aneurysm. --Internal carotid artery:No dissection, occlusion or aneurysm. Mild atherosclerotic calcification at the carotid bifurcation without hemodynamically significant stenosis. --External carotid artery: No acute abnormality. VERTEBRAL ARTERIES: Codominant configuration. Both origins are normal. The right vertebral artery is occluded distal to the origin of the right PICA but proximal to the vertebrobasilar confluence. Left vertebral artery remains patent. SKELETON: There is no bony spinal canal stenosis. No lytic or blastic lesion. OTHER NECK: Normal pharynx, larynx and major salivary glands. No cervical lymphadenopathy. Unremarkable thyroid gland. UPPER CHEST: No pneumothorax or pleural effusion. No nodules or masses. CTA HEAD FINDINGS ANTERIOR CIRCULATION: --Intracranial internal carotid arteries: Normal. --Anterior cerebral arteries: Normal. Both A1 segments are present. Patent anterior communicating artery. --Middle cerebral arteries: Normal. --Posterior communicating arteries: Absent bilaterally. POSTERIOR CIRCULATION: --Basilar artery: Patent stent within the basilar artery. --Posterior cerebral arteries: Normal. --Superior cerebellar arteries: Normal. --Inferior cerebellar arteries: Posterior inferior cerebellar arteries are normal. The right AICA is not clearly visualized. VENOUS SINUSES: As permitted by contrast timing, patent. ANATOMIC VARIANTS: None DELAYED PHASE: No parenchymal contrast enhancement. Review of the MIP images confirms the above findings. IMPRESSION: 1. No emergent  large vessel occlusion or high grade stenosis. 2. Patent basilar artery stent. 3. 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 Electronically Signed   By: Deatra Robinson M.D.   On: 01/05/2018 17:38   Mr Brain Wo Contrast  Result Date: 01/06/2018 CLINICAL DATA:  Thunderclap headache.  History of multiple strokes. EXAM: MRI HEAD WITHOUT CONTRAST TECHNIQUE: Multiplanar, multiecho pulse sequences of the brain and surrounding structures were obtained without intravenous contrast. COMPARISON:  CTA head neck 01/05/2018 FINDINGS: BRAIN: Small focus of acute ischemia in the left occipital lobe and 2 small foci within the left pons. There is a focus of hyperintensity on diffusion-weighted imaging within the superior medial cerebellum without a clear ADC correlate. The midline structures are normal. No midline shift or other mass effect. There are no old infarcts. Hyperintense T2-weighted signal within the superior medial left cerebellum. The cerebral and cerebellar volume are age-appropriate. Focus of chronic microhemorrhage in the left cerebellum. VASCULAR: Major intracranial arterial and venous sinus flow voids are normal. SKULL AND UPPER CERVICAL SPINE: Calvarial bone marrow signal is normal. There is no skull base mass. Visualized upper cervical spine and soft tissues are normal. SINUSES/ORBITS: No fluid levels or advanced mucosal thickening. No mastoid or middle ear effusion. The orbits are normal. IMPRESSION: 1. Multiple small foci of ischemia within the posterior circulation, including acute ischemia of the left pons and left occipital lobe and acute or early subacute  ischemia of the superior left cerebellum 2. No hemorrhage or mass effect. Electronically Signed   By: Deatra Robinson M.D.   On: 01/06/2018 21:39   Ct Head Code Stroke Wo Contrast  Result Date: 01/05/2018 CLINICAL DATA:  Code stroke.  Thunderclap headache. History of multiple strokes. EXAM: CT HEAD WITHOUT CONTRAST TECHNIQUE: Contiguous axial images were obtained from the base of the skull through the vertex without intravenous contrast. COMPARISON:  Head CT 12/03/2017 FINDINGS: Brain: There is no mass, hemorrhage or extra-axial collection. The size and configuration of the ventricles and extra-axial CSF spaces are normal. There is no acute or chronic infarction. The brain parenchyma is normal. Vascular: Basilar artery stent. Skull: The visualized skull base, calvarium and extracranial soft tissues are normal. Sinuses/Orbits: No fluid levels or advanced mucosal thickening of the visualized paranasal sinuses. No mastoid or middle ear effusion. The orbits are normal. ASPECTS [facility] Stroke Program Early CT Score) - Ganglionic level infarction (caudate, lentiform nuclei, internal capsule, insula, M1-M3 cortex): 7 - Supraganglionic infarction (M4-M6 cortex): 3 Total score (0-10 with 10 being normal): 10 IMPRESSION: 1. No acute hemorrhage. 2. ASPECTS is 10. 3. Stented basilar artery. These results were called by telephone at the time of interpretation on 01/05/2018 at 5:09 pm to Dr. Donnetta Hutching , who verbally acknowledged these results. Electronically Signed   By: Deatra Robinson M.D.   On: 01/05/2018 17:09        Scheduled Meds: .  stroke: mapping our early stages of recovery book   Does not apply Once  . aspirin  300 mg Rectal Daily   Or  . aspirin  325 mg Oral Daily  . citalopram  10 mg Oral Daily  . enoxaparin (LOVENOX) injection  60 mg Subcutaneous Q24H  . ezetimibe  10 mg Oral Daily  . gabapentin  300 mg Oral BID  . glimepiride  2 mg Oral Q breakfast  . insulin aspart  0-5 Units Subcutaneous QHS  . insulin aspart  0-9 Units Subcutaneous TID WC  . levothyroxine  175 mcg Oral QAC breakfast  . pantoprazole  40 mg Oral q morning - 10a  . sacubitril-valsartan  1 tablet Oral BID  . ticagrelor  90 mg Oral BID   Continuous  Infusions:   LOS: 0 days    Time spent: 35 minutes.     Alba Cory, MD [facility] Pager [phone]  If 7PM-7AM, please contact night-coverage [email or web address] Password [email or web address] 01/07/2018, 10:14 AM

## 2018-01-07 NOTE — Progress Notes (Addendum)
STROKE TEAM PROGRESS NOTE   INTERVAL HISTORY No family is at the bedside.  Right now he is having a "dizzy spell" in the setting of severe HA. No relief from HA. Recent BA stent. He states he developed sudden onset of involuntary movements in the left upper extremity as well as dizziness. He was seen by attending neurologist who felt his symptoms are likely nonorganic and seizures are felt less likely. He denied any double vision or vertigo but states he is having headaches and dizziness today.CT angiogram shows patent dominant left vertebral and basilar artery stent with good flow.  Vitals:   01/07/18 0409 01/07/18 1201 01/07/18 1202 01/07/18 1204  BP: 124/81 120/83 117/77 109/77  Pulse: 76 74 84 (!) 59  Resp: 18 (!) 22 (!) 22 (!) 22  Temp: 98.4 F (36.9 C) 98.3 F (36.8 C) 98.3 F (36.8 C) 98.3 F (36.8 C)  TempSrc: Oral Oral Oral Oral  SpO2: 99% 92% 95% 99%  Weight: 124.3 kg       CBC:  Recent Labs  Lab 01/05/18 1654  01/06/18 0556 01/07/18 1148  WBC 15.5*  --  10.7* 13.1*  NEUTROABS 10.1*  --   --   --   HGB 13.7   < > 13.2 13.6  HCT 43.8   < > 41.4 42.6  MCV 91.4  --  91.2 89.9  PLT 308  --  282 325   < > = values in this interval not displayed.    Basic Metabolic Panel:  Recent Labs  Lab 01/06/18 0556 01/07/18 1148  NA 137 137  K 3.9 4.2  CL 105 105  CO2 22 25  GLUCOSE 176* 199*  BUN 22* 14  CREATININE 1.04 1.23  CALCIUM 9.0 9.5   Lipid Panel:     Component Value Date/Time   CHOL 260 (H) 01/06/2018 0556   TRIG 793 (H) 01/06/2018 0556   HDL 25 (L) 01/06/2018 0556   CHOLHDL 10.4 01/06/2018 0556   VLDL UNABLE TO CALCULATE IF TRIGLYCERIDE OVER 400 mg/dL 09/81/1914 7829   LDLCALC UNABLE TO CALCULATE IF TRIGLYCERIDE OVER 400 mg/dL 56/21/3086 5784   ONGE9B:  Lab Results  Component Value Date   HGBA1C 7.9 (H) 01/06/2018   Urine Drug Screen:     Component Value Date/Time   LABOPIA NONE DETECTED 01/05/2018 1652   COCAINSCRNUR NONE DETECTED 01/05/2018  1652   LABBENZ NONE DETECTED 01/05/2018 1652   AMPHETMU NONE DETECTED 01/05/2018 1652   THCU NONE DETECTED 01/05/2018 1652   LABBARB NONE DETECTED 01/05/2018 1652    Alcohol Level     Component Value Date/Time   ETH <10 01/05/2018 1654    IMAGING Ct Angio Head W Or Wo Contrast  Result Date: 01/05/2018 CLINICAL DATA:  Altered mental status EXAM: CT ANGIOGRAPHY HEAD AND NECK TECHNIQUE: Multidetector CT imaging of the head and neck was performed using the standard protocol during bolus administration of intravenous contrast. Multiplanar CT image reconstructions and MIPs were obtained to evaluate the vascular anatomy. Carotid stenosis measurements (when applicable) are obtained utilizing NASCET criteria, using the distal internal carotid diameter as the denominator. CONTRAST:  ISOVUE-370 IOPAMIDOL (ISOVUE-370) INJECTION 76% COMPARISON:  Head CT 01/05/2018 and 11/30/2017 FINDINGS: CTA NECK FINDINGS AORTIC ARCH: There is no calcific atherosclerosis of the aortic arch. There is no aneurysm, dissection or hemodynamically significant stenosis of the visualized ascending aorta and aortic arch. Conventional 3 vessel aortic branching pattern. The visualized proximal subclavian arteries are widely patent. RIGHT CAROTID SYSTEM: --Common carotid  artery: Widely patent origin without common carotid artery dissection or aneurysm. --Internal carotid artery: No dissection, occlusion or aneurysm. No hemodynamically significant stenosis. --External carotid artery: No acute abnormality. LEFT CAROTID SYSTEM: --Common carotid artery: Widely patent origin without common carotid artery dissection or aneurysm. --Internal carotid artery:No dissection, occlusion or aneurysm. Mild atherosclerotic calcification at the carotid bifurcation without hemodynamically significant stenosis. --External carotid artery: No acute abnormality. VERTEBRAL ARTERIES: Codominant configuration. Both origins are normal. The right vertebral  artery is occluded distal to the origin of the right PICA but proximal to the vertebrobasilar confluence. Left vertebral artery remains patent. SKELETON: There is no bony spinal canal stenosis. No lytic or blastic lesion. OTHER NECK: Normal pharynx, larynx and major salivary glands. No cervical lymphadenopathy. Unremarkable thyroid gland. UPPER CHEST: No pneumothorax or pleural effusion. No nodules or masses. CTA HEAD FINDINGS ANTERIOR CIRCULATION: --Intracranial internal carotid arteries: Normal. --Anterior cerebral arteries: Normal. Both A1 segments are present. Patent anterior communicating artery. --Middle cerebral arteries: Normal. --Posterior communicating arteries: Absent bilaterally. POSTERIOR CIRCULATION: --Basilar artery: Patent stent within the basilar artery. --Posterior cerebral arteries: Normal. --Superior cerebellar arteries: Normal. --Inferior cerebellar arteries: Posterior inferior cerebellar arteries are normal. The right AICA is not clearly visualized. VENOUS SINUSES: As permitted by contrast timing, patent. ANATOMIC VARIANTS: None DELAYED PHASE: No parenchymal contrast enhancement. Review of the MIP images confirms the above findings. IMPRESSION: 1. No emergent large vessel occlusion or high grade stenosis. 2. Patent basilar artery stent. 3. 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 Electronically Signed   By: Deatra Robinson M.D.   On: 01/05/2018 17:38   Dg Chest 2 View  Result Date: 01/05/2018 CLINICAL DATA:  Altered mental status EXAM: CHEST - 2 VIEW COMPARISON:  Chest CT 11/30/2017 FINDINGS: The heart size and mediastinal contours are within normal limits. Both lungs are clear. The visualized skeletal structures are unremarkable. IMPRESSION: No active cardiopulmonary disease. Electronically Signed   By: Deatra Robinson M.D.   On: 01/05/2018 21:20   Ct Angio Neck W And/or Wo  Contrast  Result Date: 01/05/2018 CLINICAL DATA:  Altered mental status EXAM: CT ANGIOGRAPHY HEAD AND NECK TECHNIQUE: Multidetector CT imaging of the head and neck was performed using the standard protocol during bolus administration of intravenous contrast. Multiplanar CT image reconstructions and MIPs were obtained to evaluate the vascular anatomy. Carotid stenosis measurements (when applicable) are obtained utilizing NASCET criteria, using the distal internal carotid diameter as the denominator. CONTRAST:  ISOVUE-370 IOPAMIDOL (ISOVUE-370) INJECTION 76% COMPARISON:  Head CT 01/05/2018 and 11/30/2017 FINDINGS: CTA NECK FINDINGS AORTIC ARCH: There is no calcific atherosclerosis of the aortic arch. There is no aneurysm, dissection or hemodynamically significant stenosis of the visualized ascending aorta and aortic arch. Conventional 3 vessel aortic branching pattern. The visualized proximal subclavian arteries are widely patent. RIGHT CAROTID SYSTEM: --Common carotid artery: Widely patent origin without common carotid artery dissection or aneurysm. --Internal carotid artery: No dissection, occlusion or aneurysm. No hemodynamically significant stenosis. --External carotid artery: No acute abnormality. LEFT CAROTID SYSTEM: --Common carotid artery: Widely patent origin without common carotid artery dissection or aneurysm. --Internal carotid artery:No dissection, occlusion or aneurysm. Mild atherosclerotic calcification at the carotid bifurcation without hemodynamically significant stenosis. --External carotid artery: No acute abnormality. VERTEBRAL ARTERIES: Codominant configuration. Both origins are normal. The right vertebral artery is occluded distal to the origin of the right PICA but proximal to the vertebrobasilar confluence. Left vertebral artery  remains patent. SKELETON: There is no bony spinal canal stenosis. No lytic or blastic lesion. OTHER NECK: Normal pharynx, larynx and major salivary glands. No  cervical lymphadenopathy. Unremarkable thyroid gland. UPPER CHEST: No pneumothorax or pleural effusion. No nodules or masses. CTA HEAD FINDINGS ANTERIOR CIRCULATION: --Intracranial internal carotid arteries: Normal. --Anterior cerebral arteries: Normal. Both A1 segments are present. Patent anterior communicating artery. --Middle cerebral arteries: Normal. --Posterior communicating arteries: Absent bilaterally. POSTERIOR CIRCULATION: --Basilar artery: Patent stent within the basilar artery. --Posterior cerebral arteries: Normal. --Superior cerebellar arteries: Normal. --Inferior cerebellar arteries: Posterior inferior cerebellar arteries are normal. The right AICA is not clearly visualized. VENOUS SINUSES: As permitted by contrast timing, patent. ANATOMIC VARIANTS: None DELAYED PHASE: No parenchymal contrast enhancement. Review of the MIP images confirms the above findings. IMPRESSION: 1. No emergent large vessel occlusion or high grade stenosis. 2. Patent basilar artery stent. 3. 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 Electronically Signed   By: Deatra Robinson M.D.   On: 01/05/2018 17:38   Mr Brain Wo Contrast  Result Date: 01/06/2018 CLINICAL DATA:  Thunderclap headache.  History of multiple strokes. EXAM: MRI HEAD WITHOUT CONTRAST TECHNIQUE: Multiplanar, multiecho pulse sequences of the brain and surrounding structures were obtained without intravenous contrast. COMPARISON:  CTA head neck 01/05/2018 FINDINGS: BRAIN: Small focus of acute ischemia in the left occipital lobe and 2 small foci within the left pons. There is a focus of hyperintensity on diffusion-weighted imaging within the superior medial cerebellum without a clear ADC correlate. The midline structures are normal. No midline shift or other mass effect. There are no old infarcts. Hyperintense T2-weighted signal within the superior medial  left cerebellum. The cerebral and cerebellar volume are age-appropriate. Focus of chronic microhemorrhage in the left cerebellum. VASCULAR: Major intracranial arterial and venous sinus flow voids are normal. SKULL AND UPPER CERVICAL SPINE: Calvarial bone marrow signal is normal. There is no skull base mass. Visualized upper cervical spine and soft tissues are normal. SINUSES/ORBITS: No fluid levels or advanced mucosal thickening. No mastoid or middle ear effusion. The orbits are normal. IMPRESSION: 1. Multiple small foci of ischemia within the posterior circulation, including acute ischemia of the left pons and left occipital lobe and acute or early subacute ischemia of the superior left cerebellum 2. No hemorrhage or mass effect. Electronically Signed   By: Deatra Robinson M.D.   On: 01/06/2018 21:39   Ct Head Code Stroke Wo Contrast  Result Date: 01/05/2018 CLINICAL DATA:  Code stroke. Thunderclap headache. History of multiple strokes. EXAM: CT HEAD WITHOUT CONTRAST TECHNIQUE: Contiguous axial images were obtained from the base of the skull through the vertex without intravenous contrast. COMPARISON:  Head CT 12/03/2017 FINDINGS: Brain: There is no mass, hemorrhage or extra-axial collection. The size and configuration of the ventricles and extra-axial CSF spaces are normal. There is no acute or chronic infarction. The brain parenchyma is normal. Vascular: Basilar artery stent. Skull: The visualized skull base, calvarium and extracranial soft tissues are normal. Sinuses/Orbits: No fluid levels or advanced mucosal thickening of the visualized paranasal sinuses. No mastoid or middle ear effusion. The orbits are normal. ASPECTS [facility] Stroke Program Early CT Score) - Ganglionic level infarction (caudate, lentiform nuclei, internal capsule, insula, M1-M3 cortex): 7 - Supraganglionic infarction (M4-M6 cortex): 3 Total score (0-10 with 10 being normal): 10 IMPRESSION: 1. No acute hemorrhage. 2. ASPECTS is 10. 3.  Stented basilar artery. These results  were called by telephone at the time of interpretation on 01/05/2018 at 5:09 pm to Dr. Donnetta Hutching , who verbally acknowledged these results. Electronically Signed   By: Deatra Robinson M.D.   On: 01/05/2018 17:09    PHYSICAL EXAM Pleasant middle-age Caucasian male currently not in distress. . Afebrile. Head is nontraumatic. Neck is supple without bruit.    Cardiac exam no murmur or gallop. Lungs are clear to auscultation. Distal pulses are well felt. Neurological Exam :  Awake alert oriented 3 with normal speech and language function. Extraocular movements are full range but he has saccadic dysmetria on lateral gaze left greater than right. Blinks to threat bilaterally. Fundi is not visualized. Face is symmetric. Tongue midline. Motor system exam reveals symmetric upper and lower extremity strength without focal weakness or drift. Deep tendon fibula symmetric. Plantars are downgoing. Sensation is intact. Finger-to-nose and needle coordination accurate. Gait not tested.  ASSESSMENT/PLAN Mr. BAKER MORONTA is a 47 y.o. male with history of HTN, HLD, DB, CAD w/ stent, EF 35-40%, CVA, w/ BA intracranial stent presenting with severe HA, dizziness and arm shaking.   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 Reocclusion and current strokes would not explain presenting symptoms.   Code Stroke CT head No acute stroke/hmg. stented BA. ASPECTS 10.     CTA head & neck no ELVO. Patent BA stent. Occlusion R V4  MRI  Mult small posterior circulation infarcts in L pons and L occipital lobe. No hgm  2D Echo  In Sept 2019  EF 35-40%. No source of embolus   No need for Cerebral angiogram , based on review of films by Pearlean Brownie and Deveshwar, stent appears open  Check EEG pending   Check P2Y12 pending   LDL UTC, was 205 in Sept  [id number]  7.9  Lovenox 60 mg sq daily for VTE prophylaxis  aspirin 81 mg daily and Brilinta 90 bid prior to admission, now on aspirin 81 mg daily and Brilinta 90 bid. Continue DAPT  Therapy recommendations:  No OT, no PT  Disposition:  pending   Severe HA  Try Tylenol #3  VA and BA stenosis - symptomatic  S/p BA stent/angioplasty 12/03/2017 by Dr. Corliss Skains  CAD  Status post cardiac stent early Sept   EF 35 to 40%  On aspirin and Brilinta  Close cardiology follow-up  Hypertension  Stable . SBP goal 120-180 . Long-term BP goal normotensive  Hyperlipidemia  Home meds:  zetia 10, resumed in hospital  LDL UTC, was 205 in Sept, goal < 70  Statin intolerant with rhabdo in the past   Follow with cardiology lipid clinic to consider PCSK9 inhibitors  Diabetes type II  HgbA1c 7.9, down from 8.5 in Sept, goal < 7.0  Uncontrolled  Needs close PCP followup at d/c  Other Stroke Risk Factors  Cigarette smoker, advised to stop smoking  Obesity, Body mass index is 31.68 kg/m., recommend weight loss, diet and exercise as appropriate   Hx stroke/TIA  11/2017 possible TIA, embolic BA stenosis  Stroke prior to 11/2017 by imaging only  Other Active Problems  Leukocytosis 10.7-> 13.1  Hospital day # 0  Annie Main, MSN, APRN, ANVP-BC, AGPCNP-BC Advanced Practice Stroke Nurse [facility] See Amion for Schedule & Pager information 01/07/2018 12:41 PM  I have personally examined this patient, reviewed notes, independently viewed imaging studies,  participated in medical decision making and plan of care.ROS completed by me personally and pertinent positives fully documented  I have made any additions or clarifications directly to the above note. Agree with note above. He has presented with bizarre symptoms of involuntary left upper expanded moment and dizziness and MRI that showed tiny punctate infarcts in the pons and left parietal-occipital occipital cortex as well  as left superior cerebellum. The recent left vertebrobasilar artery stent appears to be patent. Recommend check a 2) levels. Continue dual antiplatelet therapy of aspirin and Brilinta. Discuss imaging findings with Dr. Corliss Skains who agrees with present treatment and feels there is no role for diagnostic cerebral catheter angiogram at the present time  . Physical occupational therapy consults. Some discussion the patient and answered questions. Greater than 50% time during this 35 minutes visit was spent on counseling and coordination of care about his presentation, strokes discussion about imaging studies and answering questions  Delia Heady, MD Medical Director [facility] Pager: [phone] 01/07/2018 6:33 PM  To contact Stroke Continuity provider, please refer to [email or web address]. After hours, contact General Neurology

## 2018-01-07 NOTE — Telephone Encounter (Signed)
Rcvd enrollment form   Case # [id number]

## 2018-01-07 NOTE — Telephone Encounter (Signed)
Pt approved for Free Entresto for 1 year until 01/05/2019 through [employer] Pt assistance,LM on home phone and LM with sister Lynden Ang

## 2018-01-07 NOTE — Progress Notes (Signed)
Physical Therapy Evaluation Patient Details Name: Devin [name] MRN: [id number] DOB: 25-Nov-1970 Today's Date: 01/07/2018   History of Present Illness  47 y/o male with history of CAD with DES x 2 placed on 11/15/17, recent TIA 11/2017, DM2, HTN, sCHF, hypothyroidism and Tobacco abuse presented by EMS from [facility] where the patient was noted to have shaking of his left upper extremity.  Apparently, the patient was acting confused at the time.  He denies any new or worsening focal extremity weakness, visual disturbance, speech difficulty or dysesthesia.  Pt has c/o daily headache since d/c from the hospital on 12/05/17.  He is unable to clarify how long his left hand shook..  CT negative for acute event.  Clinical Impression  Pt is at or close to baseline functioning and should be safe at home alone. There are no further acute PT needs.  Will sign off at this time.     Follow Up Recommendations No PT follow up    Equipment Recommendations  None recommended by PT    Recommendations for Other Services       Precautions / Restrictions        Mobility  Bed Mobility Overal bed mobility: Independent                Transfers Overall transfer level: Modified independent               General transfer comment: use UE  Ambulation/Gait Ambulation/Gait assistance: Independent Gait Distance (Feet): 700 Feet Assistive device: None Gait Pattern/deviations: Step-through pattern Gait velocity: able to speed up significantly Gait velocity interpretation: <1.8 ft/sec, indicate of risk for recurrent falls General Gait Details: wide BOS, mildly flexed posture, "rolling gait pattern"  Stairs            Wheelchair Mobility    Modified Rankin (Stroke Patients Only)       Balance Overall balance assessment: No apparent balance deficits (not formally assessed)                               Standardized Balance Assessment Standardized Balance Assessment :  Dynamic Gait Index   Dynamic Gait Index Level Surface: Normal Change in Gait Speed: Normal Gait with Horizontal Head Turns: Normal Gait with Vertical Head Turns: Normal Gait and Pivot Turn: Normal Step Over Obstacle: Mild Impairment Step Around Obstacles: Mild Impairment Steps: Normal Total Score: 22       Pertinent Vitals/Pain Pain Assessment: Faces Faces Pain Scale: Hurts a little bit Pain Location: head Pain Descriptors / Indicators: Aching Pain Intervention(s): Monitored during session    Home Living                        Prior Function                 Hand Dominance        Extremity/Trunk Assessment                Communication      Cognition Arousal/Alertness: Awake/alert Behavior During Therapy: WFL for tasks assessed/performed Overall Cognitive Status: Within Functional Limits for tasks assessed                                        General Comments      Exercises     Assessment/Plan  PT Assessment    PT Problem List         PT Treatment Interventions      PT Goals (Current goals can be found in the Care Plan section)  Acute Rehab PT Goals Patient Stated Goal: Ready to get home    Frequency     Barriers to discharge        Co-evaluation               AM-PAC PT "6 Clicks" Daily Activity  Outcome Measure Difficulty turning over in bed (including adjusting bedclothes, sheets and blankets)?: None Difficulty moving from lying on back to sitting on the side of the bed? : None Difficulty sitting down on and standing up from a chair with arms (e.g., wheelchair, bedside commode, etc,.)?: None Help needed moving to and from a bed to chair (including a wheelchair)?: None Help needed walking in hospital room?: None Help needed climbing 3-5 steps with a railing? : None 6 Click Score: 24    End of Session   Activity Tolerance: Patient tolerated treatment well Patient left: in bed Nurse  Communication: Mobility status      Time: 1610-9604 PT Time Calculation (min) (ACUTE ONLY): 21 min   Charges:   PT Evaluation $PT Eval Low Complexity: 1 Low          01/07/2018  Lakeshire Bing, PT Acute Rehabilitation Services [phone]  (pager) [phone]  (office)  Devin [name] 01/07/2018, 11:15 AM

## 2018-01-07 NOTE — Progress Notes (Signed)
Bedside EEG completed; results pending. 

## 2018-01-07 NOTE — Progress Notes (Signed)
Pt c/o severe headache and dizziness; prn tylenol given; orthostatic BP taken; MD notified and new orders received. Neuro MD on unit notified. Will continue to closely monitor. Dionne Bucy RN

## 2018-01-08 ENCOUNTER — Telehealth: Payer: Self-pay | Admitting: Cardiology

## 2018-01-08 LAB — GLUCOSE, CAPILLARY
GLUCOSE-CAPILLARY: 140 mg/dL — AB (ref 70–99)
Glucose-Capillary: 159 mg/dL — ABNORMAL HIGH (ref 70–99)

## 2018-01-08 MED ORDER — GEMFIBROZIL 600 MG PO TABS: 600.0000 mg | ORAL_TABLET | Freq: Two times a day (BID) | ORAL | 0 refills | Status: DC

## 2018-01-08 NOTE — Discharge Summary (Signed)
Physician Discharge Summary  Devin [name] PHK:[id number] DOB: 1970/10/12 DOA: 01/05/2018  PCP: Sandria Manly Verdigre date: 29/57/4734 Discharge date: 01/08/2018  Admitted From: Home  Disposition:  Home   Recommendations for Outpatient Follow-up:  1. Follow up with PCP in 1-2 weeks 2. Please obtain BMP/CBC in one week 3. Needs to follow up with Primary cardiology, notice entresto was discontinue due to soft BP, orthostatic vitals positive and dizziness.     Discharge Condition: Stable.  CODE STATUS; full code.  Diet recommendation: Heart Healthy    Brief/Interim Summary: Brief Narrative: 47 y/o male with history of CAD with DES x 2 placed on 11/15/17, recent TIA 11/2017, DM2, HTN, sCHF, hypothyroidism and Tobacco abuse presented by EMS from [facility] where the patient was noted to have shaking of his left upper extremity.Apparently, the patient was acting confused at the time. He denies any new or worsening focal extremity weakness, visual disturbance, speech difficulty or dysesthesia. Pt has c/o daily headache since d/c from the hospital on 12/05/17. He is unable to clarifyhow long his left hand shook. He states that "I was still shaking when I got in the ambulance."He thinks that things may have improved when he arrived in the emergency department. The patient denies any over-the-counter NSAIDs. He denies any loss of consciousness, bowel or bladder incontinence, or biting his tongue. He states that he ran out of all his medications on 12/27/2017 including his antihypertensive and antiplatelet agents. He went to the health department and [place] on 12[place]28[place]2017. Apparently, he states that they were able to help him ascertain all of his medications, and he restarted back on his medications on 01/01/2018. In addition, the patient had a recent admission from 11/13/2017 through 11/16/2017 when he had unstable angina. The patient under splint left heart  catheterization on 11/15/2017 with placement of DES x2. The patient was started on aspirin and Brilinta. The patient was readmitted on 11/30/2017 through 12/05/2017 when he was treated for TIA. 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 He underwent stent placement of the basilar artery on 12/03/2017. He was continued on aspirin and Brilinta. MRI of the brain at that time showed chronic left cerebellar infarct. During his stay in the emergency department, the patient has been afebrile hemodynamically stable saturating 99% room air. Because of his capitate history, the patient is being [facility] and MRI of the brain.   Assessment & Plan:   Principal Problem:   Altered mental status Active Problems:   Tobacco abuse   Hypertension   Hyperlipidemia   Hypothyroidism   Type 2 diabetes mellitus without complications (HCC)   History of stroke   Uncontrolled type 2 diabetes mellitus with hyperglycemia (HCC)   Seizure-like activity (HCC)   Chronic combined systolic and diastolic CHF (congestive heart failure) (HCC)  1-Acute stroke.  Presents with balance problem and  abnormal movement of left arm.  MRI positive for multiples stroke posterior circulation.  On brillinta and aspirin.  Will follow neurology recommendation.  Hold entresto.  Triglyceride 793 start lopid. Does not tolerates statins history of rhabdomyolysis.  EEG ordered.  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   2-Chronic systolic Heart failure.  Compensated.  Hold entresto due to orthostatic vital  positive and dizziness. Close follow up with  cardiologist  Unable to use BB due to bradycardia.   3-CAD;  On aspirin and brilinta.  -11/15/2017 heart catheterization-->LM patent, LAD mid 80 to 90%, LCX distal 90%, OM2 90%, LPDA 90%, RCA small with prox 99% LVgram 35-45%;DES x2  Hypothyroidism;  TSH at 60 Synthroid increase to 175 this admission  Diabetic polyneuropathy -Continue gabapentin  Hyperlipidemia -Patient developed rhabdomyolysis on Crestor -Continue Zetia. -add lopid due to hypertriglyceridemia.     Discharge Diagnoses:  Principal Problem:   Altered mental status Active Problems:   Tobacco abuse   Hypertension   Hyperlipidemia   Hypothyroidism   Type 2 diabetes mellitus without complications (HCC)   History of stroke   Uncontrolled type 2 diabetes mellitus with hyperglycemia (HCC)   Seizure-like activity (HCC)   Chronic combined systolic and diastolic CHF (congestive heart failure) [facility])    Discharge Instructions  Discharge Instructions    Diet - low sodium heart healthy   Complete by:  As directed    Increase activity slowly   Complete by:  As directed      Allergies as of 01/08/2018      Reactions   Bee Venom Swelling   Crestor [rosuvastatin] Other (See Comments)   rhabdomyolosis      Medication List    STOP taking these medications   sacubitril-valsartan 24-26 MG Commonly known as:  ENTRESTO     TAKE these medications   aspirin 81 MG EC tablet Take 1 tablet (81 mg total) by mouth daily. With food   Blood Glucose Monitoring Suppl w/Device Kit Dispense based on patient and insurance preference. Check blood sugar daily before breakfast (ICD9 250.0)   citalopram 10 MG tablet Commonly known as:  CELEXA Take 1 tablet (10 mg total) by mouth daily.   ezetimibe 10 MG tablet Commonly known as:  ZETIA Take 1 tablet (10 mg total) by mouth daily.   gabapentin 300 MG capsule Commonly known as:  NEURONTIN Take 1 capsule (300 mg total) by mouth 2 (two) times daily.   gemfibrozil  600 MG tablet Commonly known as:  LOPID Take 1 tablet (600 mg total) by mouth 2 (two) times daily before a meal.   glimepiride 2 MG tablet Commonly known as:  AMARYL Take 1 tablet (2 mg total) by mouth daily with breakfast.   levothyroxine 175 MCG tablet Commonly known as:  SYNTHROID, LEVOTHROID Take 175 mcg by mouth daily before breakfast. What changed:  Another medication with the same name was removed. Continue taking this medication, and follow the directions you see here.   nitroGLYCERIN 0.4 MG SL tablet Commonly known as:  NITROSTAT Place 0.4 mg under the tongue every 5 (five) minutes as needed for chest pain.   pantoprazole 40 MG tablet Commonly known as:  PROTONIX Take 40 mg by mouth every morning. What changed:  Another medication with the same name was removed. Continue taking this medication, and follow the directions you see here.   ticagrelor 90 MG Tabs tablet Commonly known as:  BRILINTA Take 1 tablet (90 mg total) by mouth 2 (two) times daily.      Follow-up Information    Health, [facility] Follow up.   Contact information: [address] [place] [address] [phone]          Allergies  Allergen Reactions  . Bee Venom Swelling  . Crestor [Rosuvastatin] Other (See Comments)    rhabdomyolosis    Consultations:  Neurology  Procedures/Studies: Ct Angio Head W Or Wo Contrast  Result Date: 01/05/2018 CLINICAL DATA:  Altered mental status EXAM: CT ANGIOGRAPHY HEAD AND NECK TECHNIQUE: Multidetector CT imaging of the head and neck was performed using the standard protocol during bolus administration of intravenous contrast. Multiplanar CT image reconstructions and MIPs were obtained to evaluate the vascular anatomy. Carotid stenosis measurements (when applicable) are obtained utilizing NASCET criteria, using the distal internal carotid diameter as the denominator. CONTRAST:  132m ISOVUE-370 IOPAMIDOL (ISOVUE-370) INJECTION 76%  COMPARISON:  Head CT 01/05/2018 and 11/30/2017 FINDINGS: CTA NECK FINDINGS AORTIC ARCH: There is no calcific atherosclerosis of the aortic arch. There is no aneurysm, dissection or hemodynamically significant stenosis of the visualized ascending aorta and aortic arch. Conventional 3 vessel aortic branching pattern. The visualized proximal subclavian arteries are widely patent. RIGHT CAROTID SYSTEM: --Common carotid artery: Widely patent origin without common carotid artery dissection or aneurysm. --Internal carotid artery: No dissection, occlusion or aneurysm. No hemodynamically significant stenosis. --External carotid artery: No acute abnormality. LEFT CAROTID SYSTEM: --Common carotid artery: Widely patent origin without common carotid artery dissection or aneurysm. --Internal carotid artery:No dissection, occlusion or aneurysm. Mild atherosclerotic calcification at the carotid bifurcation without hemodynamically significant stenosis. --External carotid artery: No acute abnormality. VERTEBRAL ARTERIES: Codominant configuration. Both origins are normal. The right vertebral artery is occluded distal to the origin of the right PICA but proximal to the vertebrobasilar confluence. Left vertebral artery remains patent. SKELETON: There is no bony spinal canal stenosis. No lytic or blastic lesion. OTHER NECK: Normal pharynx, larynx and major salivary glands. No cervical lymphadenopathy. Unremarkable thyroid gland. UPPER CHEST: No pneumothorax or pleural effusion. No nodules or masses. CTA HEAD FINDINGS ANTERIOR CIRCULATION: --Intracranial internal carotid arteries: Normal. --Anterior cerebral arteries: Normal. Both A1 segments are present. Patent anterior communicating artery. --Middle cerebral arteries: Normal. --Posterior communicating arteries: Absent bilaterally. POSTERIOR CIRCULATION: --Basilar artery: Patent stent within the basilar artery. --Posterior cerebral arteries: Normal. --Superior cerebellar arteries:  Normal. --Inferior cerebellar arteries: Posterior inferior cerebellar arteries are normal. The right AICA is not clearly visualized. VENOUS SINUSES: As permitted by contrast timing, patent. ANATOMIC VARIANTS: None DELAYED PHASE: No parenchymal contrast enhancement. Review of the MIP images confirms the above findings. IMPRESSION: 1. No emergent large vessel occlusion or high grade stenosis. 2. Patent basilar artery stent. 3. 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 Electronically Signed   By: KUlyses JarredM.D.   On: 01/05/2018 17:38   Dg Chest 2 View  Result Date: 01/05/2018 CLINICAL DATA:  Altered mental status EXAM: CHEST - 2 VIEW COMPARISON:  Chest CT 11/30/2017 FINDINGS: The heart size and mediastinal contours are within normal limits. Both lungs are clear. The visualized skeletal structures are unremarkable. IMPRESSION: No active cardiopulmonary disease. Electronically Signed   By: KUlyses JarredM.D.   On: 01/05/2018 21:20   Ct Angio Neck W And/or Wo Contrast  Result Date: 01/05/2018 CLINICAL DATA:  Altered mental status EXAM: CT ANGIOGRAPHY HEAD AND NECK TECHNIQUE: Multidetector CT imaging of the head and neck was performed using the standard protocol during bolus administration of intravenous contrast. Multiplanar CT image reconstructions and MIPs were obtained to evaluate the vascular anatomy. Carotid stenosis measurements (when applicable) are obtained utilizing NASCET criteria, using the distal internal carotid diameter as the denominator. CONTRAST:  1061mISOVUE-370 IOPAMIDOL (ISOVUE-370) INJECTION 76% COMPARISON:  Head CT 01/05/2018 and 11/30/2017 FINDINGS: CTA NECK FINDINGS AORTIC ARCH: There is  no calcific atherosclerosis of the aortic arch. There is no aneurysm, dissection or hemodynamically significant stenosis of the visualized ascending aorta and aortic arch. Conventional 3  vessel aortic branching pattern. The visualized proximal subclavian arteries are widely patent. RIGHT CAROTID SYSTEM: --Common carotid artery: Widely patent origin without common carotid artery dissection or aneurysm. --Internal carotid artery: No dissection, occlusion or aneurysm. No hemodynamically significant stenosis. --External carotid artery: No acute abnormality. LEFT CAROTID SYSTEM: --Common carotid artery: Widely patent origin without common carotid artery dissection or aneurysm. --Internal carotid artery:No dissection, occlusion or aneurysm. Mild atherosclerotic calcification at the carotid bifurcation without hemodynamically significant stenosis. --External carotid artery: No acute abnormality. VERTEBRAL ARTERIES: Codominant configuration. Both origins are normal. The right vertebral artery is occluded distal to the origin of the right PICA but proximal to the vertebrobasilar confluence. Left vertebral artery remains patent. SKELETON: There is no bony spinal canal stenosis. No lytic or blastic lesion. OTHER NECK: Normal pharynx, larynx and major salivary glands. No cervical lymphadenopathy. Unremarkable thyroid gland. UPPER CHEST: No pneumothorax or pleural effusion. No nodules or masses. CTA HEAD FINDINGS ANTERIOR CIRCULATION: --Intracranial internal carotid arteries: Normal. --Anterior cerebral arteries: Normal. Both A1 segments are present. Patent anterior communicating artery. --Middle cerebral arteries: Normal. --Posterior communicating arteries: Absent bilaterally. POSTERIOR CIRCULATION: --Basilar artery: Patent stent within the basilar artery. --Posterior cerebral arteries: Normal. --Superior cerebellar arteries: Normal. --Inferior cerebellar arteries: Posterior inferior cerebellar arteries are normal. The right AICA is not clearly visualized. VENOUS SINUSES: As permitted by contrast timing, patent. ANATOMIC VARIANTS: None DELAYED PHASE: No parenchymal contrast enhancement. Review of the MIP images  confirms the above findings. IMPRESSION: 1. No emergent large vessel occlusion or high grade stenosis. 2. Patent basilar artery stent. 3. 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 Electronically Signed   By: Ulyses Jarred M.D.   On: 01/05/2018 17:38   Mr Brain Wo Contrast  Result Date: 01/06/2018 CLINICAL DATA:  Thunderclap headache.  History of multiple strokes. EXAM: MRI HEAD WITHOUT CONTRAST TECHNIQUE: Multiplanar, multiecho pulse sequences of the brain and surrounding structures were obtained without intravenous contrast. COMPARISON:  CTA head neck 01/05/2018 FINDINGS: BRAIN: Small focus of acute ischemia in the left occipital lobe and 2 small foci within the left pons. There is a focus of hyperintensity on diffusion-weighted imaging within the superior medial cerebellum without a clear ADC correlate. The midline structures are normal. No midline shift or other mass effect. There are no old infarcts. Hyperintense T2-weighted signal within the superior medial left cerebellum. The cerebral and cerebellar volume are age-appropriate. Focus of chronic microhemorrhage in the left cerebellum. VASCULAR: Major intracranial arterial and venous sinus flow voids are normal. SKULL AND UPPER CERVICAL SPINE: Calvarial bone marrow signal is normal. There is no skull base mass. Visualized upper cervical spine and soft tissues are normal. SINUSES/ORBITS: No fluid levels or advanced mucosal thickening. No mastoid or middle ear effusion. The orbits are normal. IMPRESSION: 1. Multiple small foci of ischemia within the posterior circulation, including acute ischemia of the left pons and left occipital lobe and acute or early subacute ischemia of the superior left cerebellum 2. No hemorrhage or mass effect. Electronically Signed   By: Ulyses Jarred M.D.   On: 01/06/2018 21:39   Ct Head Code Stroke Wo Contrast  Result  Date: 01/05/2018 CLINICAL DATA:  Code stroke. Thunderclap headache. History of multiple strokes. EXAM: CT HEAD WITHOUT CONTRAST TECHNIQUE: Contiguous axial  images were obtained from the base of the skull through the vertex without intravenous contrast. COMPARISON:  Head CT 12/03/2017 FINDINGS: Brain: There is no mass, hemorrhage or extra-axial collection. The size and configuration of the ventricles and extra-axial CSF spaces are normal. There is no acute or chronic infarction. The brain parenchyma is normal. Vascular: Basilar artery stent. Skull: The visualized skull base, calvarium and extracranial soft tissues are normal. Sinuses/Orbits: No fluid levels or advanced mucosal thickening of the visualized paranasal sinuses. No mastoid or middle ear effusion. The orbits are normal. ASPECTS [facility] Stroke Program Early CT Score) - Ganglionic level infarction (caudate, lentiform nuclei, internal capsule, insula, M1-M3 cortex): 7 - Supraganglionic infarction (M4-M6 cortex): 3 Total score (0-10 with 10 being normal): 10 IMPRESSION: 1. No acute hemorrhage. 2. ASPECTS is 10. 3. Stented basilar artery. These results were called by telephone at the time of interpretation on 01/05/2018 at 5:09 pm to Dr. Nat Christen , who verbally acknowledged these results. Electronically Signed   By: Ulyses Jarred M.D.   On: 01/05/2018 17:09     Subjective: He is feeling better. No headaches.    Discharge Exam: Vitals:   01/07/18 2118 01/08/18 0529  BP: 113/78 115/77  Pulse: (!) 57 (!) 55  Resp: 16 16  Temp: 98.4 F (36.9 C) 97.6 F (36.4 C)  SpO2: 97% 95%   Vitals:   01/07/18 1349 01/07/18 2118 01/08/18 0529 01/08/18 1013  BP: 117/79 113/78 115/77   Pulse: 72 (!) 57 (!) 55   Resp: 18 16 16    Temp: 98.6 F (37 C) 98.4 F (36.9 C) 97.6 F (36.4 C)   TempSrc: Oral Oral Oral   SpO2: 99% 97% 95%   Weight:    126.1 kg    General: Pt is alert, awake, not in acute distress Cardiovascular: RRR, S1/S2 +, no rubs, no  gallops Respiratory: CTA bilaterally, no wheezing, no rhonchi Abdominal: Soft, NT, ND, bowel sounds + Extremities: no edema, no cyanosis    The results of significant diagnostics from this hospitalization (including imaging, microbiology, ancillary and laboratory) are listed below for reference.     Microbiology: No results found for this or any previous visit (from the past 240 hour(s)).   Labs: BNP (last 3 results) No results for input(s): BNP in the last 8760 hours. Basic Metabolic Panel: Recent Labs  Lab 01/05/18 1654 01/05/18 1701 01/06/18 0556 01/07/18 1148  NA 137 138 137 137  K 4.1 4.3 3.9 4.2  CL 102 103 105 105  CO2 24  --  22 25  GLUCOSE 256* 259* 176* 199*  BUN 16 20 22* 14  CREATININE 1.19 1.20 1.04 1.23  CALCIUM 9.5  --  9.0 9.5   Liver Function Tests: Recent Labs  Lab 01/05/18 1654 01/06/18 0556  AST 22 17  ALT 19 16  ALKPHOS 60 59  BILITOT 0.6 0.5  PROT 7.6 6.8  ALBUMIN 4.3 3.8   No results for input(s): LIPASE, AMYLASE in the last 168 hours. No results for input(s): AMMONIA in the last 168 hours. CBC: Recent Labs  Lab 01/05/18 1654 01/05/18 1701 01/06/18 0556 01/07/18 1148  WBC 15.5*  --  10.7* 13.1*  NEUTROABS 10.1*  --   --   --   HGB 13.7 14.3 13.2 13.6  HCT 43.8 42.0 41.4 42.6  MCV 91.4  --  91.2 89.9  PLT 308  --  282 325   Cardiac Enzymes: No results for input(s): CKTOTAL, CKMB, CKMBINDEX, TROPONINI in the last  168 hours. BNP: Invalid input(s): POCBNP CBG: Recent Labs  Lab 01/07/18 1209 01/07/18 1747 01/07/18 2118 01/08/18 0748 01/08/18 1240  GLUCAP 165* 137* 141* 140* 159*   D-Dimer No results for input(s): DDIMER in the last 72 hours. Hgb A1c Recent Labs    01/06/18 0556  HGBA1C 7.9*   Lipid Profile Recent Labs    01/06/18 0556  CHOL 260*  HDL 25*  LDLCALC UNABLE TO CALCULATE IF TRIGLYCERIDE OVER 400 mg/dL  TRIG 793*  CHOLHDL 10.4   Thyroid function studies Recent Labs    01/05/18 1654  TSH  60.657*   Anemia work up No results for input(s): VITAMINB12, FOLATE, FERRITIN, TIBC, IRON, RETICCTPCT in the last 72 hours. Urinalysis    Component Value Date/Time   COLORURINE YELLOW 01/05/2018 [place] 01/05/2018 1652   LABSPEC >1.046 (H) 01/05/2018 1652   PHURINE 6.0 01/05/2018 1652   GLUCOSEU NEGATIVE 01/05/2018 1652   HGBUR NEGATIVE 01/05/2018 1652   BILIRUBINUR NEGATIVE 01/05/2018 1652   KETONESUR NEGATIVE 01/05/2018 1652   PROTEINUR NEGATIVE 01/05/2018 1652   UROBILINOGEN 0.2 10/06/2013 0305   NITRITE NEGATIVE 01/05/2018 1652   LEUKOCYTESUR NEGATIVE 01/05/2018 1652   Sepsis Labs Invalid input(s): PROCALCITONIN,  WBC,  LACTICIDVEN Microbiology No results found for this or any previous visit (from the past 240 hour(s)).   Time coordinating discharge: 35 minutes.   SIGNED:   Elmarie Shiley, MD  [facility] 01/08/2018, 12:46 PM Pager [phone]  If 7PM-7AM, please contact night-coverage [email or web address] Password [email or web address]

## 2018-01-08 NOTE — Progress Notes (Signed)
STROKE TEAM PROGRESS NOTE   INTERVAL HISTORY No family is at the bedside.   Patient states his headaches have gone and his dizziness is also almost resolved. He is being transferred to inpatient rehabilitation His P2Y12 levels were satisfactory Vitals:   01/07/18 1349 01/07/18 2118 01/08/18 0529 01/08/18 1013  BP: 117/79 113/78 115/77   Pulse: 72 (!) 57 (!) 55   Resp: 18 16 16    Temp: 98.6 F (37 C) 98.4 F (36.9 C) 97.6 F (36.4 C)   TempSrc: Oral Oral Oral   SpO2: 99% 97% 95%   Weight:    126.1 kg    CBC:  Recent Labs  Lab 01/05/18 1654  01/06/18 0556 01/07/18 1148  WBC 15.5*  --  10.7* 13.1*  NEUTROABS 10.1*  --   --   --   HGB 13.7   < > 13.2 13.6  HCT 43.8   < > 41.4 42.6  MCV 91.4  --  91.2 89.9  PLT 308  --  282 325   < > = values in this interval not displayed.    Basic Metabolic Panel:  Recent Labs  Lab 01/06/18 0556 01/07/18 1148  NA 137 137  K 3.9 4.2  CL 105 105  CO2 22 25  GLUCOSE 176* 199*  BUN 22* 14  CREATININE 1.04 1.23  CALCIUM 9.0 9.5   Lipid Panel:     Component Value Date/Time   CHOL 260 (H) 01/06/2018 0556   TRIG 793 (H) 01/06/2018 0556   HDL 25 (L) 01/06/2018 0556   CHOLHDL 10.4 01/06/2018 0556   VLDL UNABLE TO CALCULATE IF TRIGLYCERIDE OVER 400 mg/dL 16/12/9602 5409   LDLCALC UNABLE TO CALCULATE IF TRIGLYCERIDE OVER 400 mg/dL 81/19/1478 2956   OZHY8M:  Lab Results  Component Value Date   HGBA1C 7.9 (H) 01/06/2018   Urine Drug Screen:     Component Value Date/Time   LABOPIA NONE DETECTED 01/05/2018 1652   COCAINSCRNUR NONE DETECTED 01/05/2018 1652   LABBENZ NONE DETECTED 01/05/2018 1652   AMPHETMU NONE DETECTED 01/05/2018 1652   THCU NONE DETECTED 01/05/2018 1652   LABBARB NONE DETECTED 01/05/2018 1652    Alcohol Level     Component Value Date/Time   ETH <10 01/05/2018 1654    IMAGING Mr Brain Wo Contrast  Result Date: 01/06/2018 CLINICAL DATA:  Thunderclap headache.  History of multiple strokes. EXAM: MRI HEAD  WITHOUT CONTRAST TECHNIQUE: Multiplanar, multiecho pulse sequences of the brain and surrounding structures were obtained without intravenous contrast. COMPARISON:  CTA head neck 01/05/2018 FINDINGS: BRAIN: Small focus of acute ischemia in the left occipital lobe and 2 small foci within the left pons. There is a focus of hyperintensity on diffusion-weighted imaging within the superior medial cerebellum without a clear ADC correlate. The midline structures are normal. No midline shift or other mass effect. There are no old infarcts. Hyperintense T2-weighted signal within the superior medial left cerebellum. The cerebral and cerebellar volume are age-appropriate. Focus of chronic microhemorrhage in the left cerebellum. VASCULAR: Major intracranial arterial and venous sinus flow voids are normal. SKULL AND UPPER CERVICAL SPINE: Calvarial bone marrow signal is normal. There is no skull base mass. Visualized upper cervical spine and soft tissues are normal. SINUSES/ORBITS: No fluid levels or advanced mucosal thickening. No mastoid or middle ear effusion. The orbits are normal. IMPRESSION: 1. Multiple small foci of ischemia within the posterior circulation, including acute ischemia of the left pons and left occipital lobe and acute or early subacute ischemia of  the superior left cerebellum 2. No hemorrhage or mass effect. Electronically Signed   By: Deatra Robinson M.D.   On: 01/06/2018 21:39    PHYSICAL EXAM Pleasant middle-age Caucasian male currently not in distress. . Afebrile. Head is nontraumatic. Neck is supple without bruit.    Cardiac exam no murmur or gallop. Lungs are clear to auscultation. Distal pulses are well felt. Neurological Exam :  Awake alert oriented 3 with normal speech and language function. Extraocular movements are full range but he has saccadic dysmetria on lateral gaze left greater than right. Blinks to threat bilaterally. Fundi is not visualized. Face is symmetric. Tongue midline. Motor  system exam reveals symmetric upper and lower extremity strength without focal weakness or drift. Deep tendon fibula symmetric. Plantars are downgoing. Sensation is intact. Finger-to-nose and needle coordination accurate. Gait not tested.  ASSESSMENT/PLAN [name] is a 47 y.o. male with history of HTN, HLD, DB, CAD w/ stent, EF 35-40%, CVA, w/ BA intracranial stent presenting with severe HA, dizziness and arm shaking.   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 Reocclusion and current strokes would not explain presenting symptoms.   Code Stroke CT head No acute stroke/hmg. stented BA. ASPECTS 10.     CTA head & neck no ELVO. Patent BA stent. Occlusion R V4  MRI  Mult small posterior circulation infarcts in L pons and L occipital lobe. No hgm  2D Echo  In Sept 2019  EF 35-40%. No source of embolus   No need for Cerebral angiogram , based on review of films by Pearlean Brownie and Deveshwar, stent appears open  Check EEG pending   Check P2Y12 pending   LDL UTC, was 205 in Sept  [id number] 7.9  Lovenox 60 mg sq daily for VTE prophylaxis  aspirin 81 mg daily and Brilinta 90 bid prior to admission, now on aspirin 81 mg daily and Brilinta 90 bid. Continue DAPT  Therapy recommendations:  No OT, no PT  Disposition:  pending   Severe HA  Try Tylenol #3  VA and BA stenosis - symptomatic  S/p BA stent/angioplasty 12/03/2017 by Dr. Corliss Skains  CAD  Status post cardiac stent early Sept   EF 35 to 40%  On aspirin and Brilinta  Close cardiology follow-up  Hypertension  Stable . SBP goal 120-180 . Long-term BP goal normotensive  Hyperlipidemia  Home meds:  zetia 10, resumed in hospital  LDL UTC, was 205 in Sept, goal < 70  Statin intolerant with rhabdo in the past   Follow with cardiology lipid clinic to consider PCSK9 inhibitors  Diabetes type  II  HgbA1c 7.9, down from 8.5 in Sept, goal < 7.0  Uncontrolled  Needs close PCP followup at d/c  Other Stroke Risk Factors  Cigarette smoker, advised to stop smoking  Obesity, Body mass index is 32.13 kg/m., recommend weight loss, diet and exercise as appropriate   Hx stroke/TIA  11/2017 possible TIA, embolic BA stenosis  Stroke prior to 11/2017 by imaging only  Other Active Problems  Leukocytosis 10.7-> 13.1  Hospital day # 1     . Continue dual antiplatelet therapy of aspirin and Brilinta. Discussed imaging findings with Dr. Corliss Skains who agrees with present treatment and feels there is no role for diagnostic cerebral catheter angiogram at the present time  .transfer to inpatient rehabilitation. Follow-up as an outpatient in stroke clinic .  Stroke team will sign off Delia Heady, MD Medical Director [facility] Pager: [phone] 01/08/2018 5:47 PM  To contact Stroke Continuity provider, please refer to [email or web address]. After hours, contact General Neurology

## 2018-01-08 NOTE — Discharge Instructions (Signed)
Please follow-up with your cardiologist.

## 2018-01-08 NOTE — Progress Notes (Signed)
Pt given discharge instructions, prescriptions, and care notes. Pt verbalized understanding AEB no further questions or concerns at this time. IV was discontinued, no redness, pain, or swelling noted at this time. Telemetry discontinued and Centralized Telemetry was notified. Pt left the floor via wheelchair with staff in stable condition. 

## 2018-01-08 NOTE — Telephone Encounter (Signed)
[employer] was closing pt case out, they will call patient at home to get more info regarding [employer] program

## 2018-01-08 NOTE — Telephone Encounter (Signed)
Devin [name] w/ [employer] [facility] needing the approval dates the pt was approved for.   [phone] ext [phone]

## 2018-01-15 ENCOUNTER — Encounter: Payer: Self-pay | Admitting: Student

## 2018-01-15 ENCOUNTER — Ambulatory Visit (INDEPENDENT_AMBULATORY_CARE_PROVIDER_SITE_OTHER): Payer: Self-pay | Admitting: Student

## 2018-01-15 VITALS — BP 146/86 | HR 69 | Ht 78.0 in | Wt 269.6 lb

## 2018-01-15 DIAGNOSIS — I255 Ischemic cardiomyopathy: Secondary | ICD-10-CM

## 2018-01-15 DIAGNOSIS — Z720 Tobacco use: Secondary | ICD-10-CM

## 2018-01-15 DIAGNOSIS — E785 Hyperlipidemia, unspecified: Secondary | ICD-10-CM

## 2018-01-15 DIAGNOSIS — I2510 Atherosclerotic heart disease of native coronary artery without angina pectoris: Secondary | ICD-10-CM

## 2018-01-15 DIAGNOSIS — E1169 Type 2 diabetes mellitus with other specified complication: Secondary | ICD-10-CM

## 2018-01-15 DIAGNOSIS — I639 Cerebral infarction, unspecified: Secondary | ICD-10-CM

## 2018-01-15 MED ORDER — NITROGLYCERIN 0.4 MG SL SUBL: 0.4000 mg | SUBLINGUAL_TABLET | SUBLINGUAL | 3 refills | Status: DC | PRN

## 2018-01-15 MED ORDER — LOSARTAN POTASSIUM 25 MG PO TABS: 12.5000 mg | ORAL_TABLET | Freq: Every day | ORAL | 2 refills | Status: DC

## 2018-01-15 NOTE — Patient Instructions (Signed)
Medication Instructions:  Your physician has recommended you make the following change in your medication:  Losartan 12.5 mg Daily   If you need a refill on your cardiac medications before your next appointment, please call your pharmacy.   Lab work: Your physician recommends that you return for lab work in: 2 Months ( Just before your next visit)   If you have labs (blood work) drawn today and your tests are completely normal, you will receive your results only by: Marland Kitchen MyChart Message (if you have MyChart) OR . A paper copy in the mail If you have any lab test that is abnormal or we need to change your treatment, we will call you to review the results.  Testing/Procedures: NONE   Follow-Up: At [facility], you and your health needs are our priority.  As part of our continuing mission to provide you with exceptional heart care, we have created designated Provider Care Teams.  These Care Teams include your primary Cardiologist (physician) and Advanced Practice Providers (APPs -  Physician Assistants and Nurse Practitioners) who all work together to provide you with the care you need, when you need it. You will need a follow up appointment in 2 months.  Please call our office 2 months in advance to schedule this appointment.  You may see Dina Rich, MD or one of the following Advanced Practice Providers on your designated Care Team:   Randall An, PA-C [facility]) . Jacolyn Reedy, PA-C [facility] Office)  Any Other Special Instructions Will Be Listed Below (If Applicable). Thank you for choosing [facility]!

## 2018-01-15 NOTE — Progress Notes (Addendum)
Cardiology Office Note    Date:  01/15/2018   ID:  Devin [name], DOB 1970-06-15, MRN [id number]  PCP:  [facility]  Cardiologist: Carlyle Dolly, MD    Chief Complaint  Patient presents with  . Hospitalization Follow-up    History of Present Illness:    Devin [name] is a 47 y.o. male with past medical history of CAD (s/p DES x2 to LAD and DES to OM in 11/2017), ischemic cardiomyopathy (EF 35-40%), Type 2 DM, HTN, HLD, and Hypothyroidism who presents to the office today for hospital follow-up.  He was last evaluated by Dr. Harl Bowie in 11/2017 following a recent admission for unstable angina during which he underwent PCI as outlined above. He denied any recurrent episodes of chest pain at the time of his office visit but was experiencing occasional shortness of breath. Had a history of rhabdomyolysis on Crestor in the past, therefore [facility] had [facility]. Was continued on ASA and Brilinta with Imdur being changed to Amlodipine 5 mg daily due to frequent headaches.  In the interim, he was admitted to [facility] from 11/30/2017 and 12/05/2017 for evaluation of chest pain and dizziness. He was also found to have mild weakness of his left side upon admission and Code Stroke was activated. 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 [name].   He was again admitted from 01/05/2018 to 01/08/2018 for an acute CVA with MRI on admission showed multiple small foci of ischemia within the posterior circulation.  He was followed closely by the stroke team and it was recommended he continue on dual antiplatelet therapy as there was thought to be no role for diagnostic cerebral catheter angiogram during admission. Delene Loll was discontinued at the time of hospital discharge due to hypotension and he was not started on beta-blocker therapy given baseline bradycardia.  In talking  with the patient and his brother-in-law today, he denies any recent episodes of chest pain or palpitations. Does have baseline dyspnea on exertion but feels like this has improved over the past few months. He continues to experience issues with dizziness which can occur with changing positions or at random times. This was occurring prior to his CVA and he is unsure if symptoms improved with discontinuation of Entresto.   He does not follow weights at home as he does not have a scale but reports having lost 10 pounds over the past few months.  Denies any specific orthopnea, PND, or lower extremity edema.  Remains on ASA and Brilinta. No evidence of active bleeding reported.    Past Medical History:  Diagnosis Date  . Asthma   . Chest pain   . Coronary artery disease    a. s/p DES x2 to LAD and DES to OM in 11/2017  . Dyspnea   . GERD (gastroesophageal reflux disease)   . History of kidney stones   . History of rhabdomyolysis   . Hyperlipidemia   . Hypertension   . Hypothyroidism   . Ischemic cardiomyopathy    a. 11/2017: echo showing EF of 35-40%, diffuse HK, and Grade 2 DD  . Kidney calculus 2014  . Pericardial effusion    Small, by dobutamine echocardiogram, 04/2006  . Stroke ([place])   . Tobacco abuse   . Type 2 diabetes mellitus without complications ([place]) 12/05/4626  . Vitamin D deficiency     Past Surgical History:  Procedure Laterality  Date  . 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 CHOLECYSTECTOMY N/A 01/27/2013   Procedure: LAPAROSCOPIC CHOLECYSTECTOMY;  Surgeon: Jamesetta So, MD;  Location: [facility];  Service: General;  Laterality: N/A;  . CORONARY STENT INTERVENTION N/A 11/15/2017   Procedure: CORONARY STENT INTERVENTION;  Surgeon: Leonie Man, MD;  Location: [facility];  Service: Cardiovascular;  Laterality: N/A;  . HERNIA REPAIR     As a child  . INSERTION OF MESH N/A  11/13/2012   Procedure: INSERTION OF MESH;  Surgeon: Jamesetta So, MD;  Location: [facility];  Service: General;  Laterality: N/A;  . IR ANGIO INTRA EXTRACRAN SEL COM CAROTID INNOMINATE BILAT MOD SED  12/01/2017  . IR ANGIO VERTEBRAL SEL VERTEBRAL BILAT MOD SED  12/01/2017  . IR INTRA CRAN STENT  12/03/2017  . KIDNEY STONE SURGERY    . LEFT HEART CATH AND CORONARY ANGIOGRAPHY N/A 11/15/2017   Procedure: LEFT HEART CATH AND CORONARY ANGIOGRAPHY;  Surgeon: Leonie Man, MD;  Location: [place] [facility];  Service: Cardiovascular;  Laterality: N/A;  . PERCUTANEOUS NEPHROLITHOTRIPSY    . RADIOLOGY WITH ANESTHESIA Left 12/03/2017   Procedure: Angioplasty with possible stenting of left VBJ;  Surgeon: Luanne Bras, MD;  Location: [facility];  Service: Radiology;  Laterality: Left;  . UMBILICAL HERNIA REPAIR N/A 11/13/2012   Procedure: UMBILICAL HERNIORRHAPHY;  Surgeon: Jamesetta So, MD;  Location: [facility];  Service: General;  Laterality: N/A;  . VARICOCELECTOMY      Current Medications: Outpatient Medications Prior to Visit  Medication Sig Dispense Refill  . aspirin 81 MG EC tablet Take 1 tablet (81 mg total) by mouth daily. With food 30 tablet 0  . Blood Glucose Monitoring Suppl w/Device KIT Dispense based on patient and insurance preference. Check blood sugar daily before breakfast (ICD9 250.0) 1 each 0  . citalopram (CELEXA) 10 MG tablet Take 1 tablet (10 mg total) by mouth daily. 30 tablet 3  . ezetimibe (ZETIA) 10 MG tablet Take 1 tablet (10 mg total) by mouth daily. 30 tablet 3  . gabapentin (NEURONTIN) 300 MG capsule Take 1 capsule (300 mg total) by mouth 2 (two) times daily. 60 capsule 4  . glimepiride (AMARYL) 2 MG tablet Take 1 tablet (2 mg total) by mouth daily with breakfast. 30 tablet 0  . levothyroxine (SYNTHROID, LEVOTHROID) 175 MCG tablet Take 175 mcg by mouth daily before breakfast.    . pantoprazole (PROTONIX) 40 MG tablet Take 40 mg by mouth every morning.    . ticagrelor (BRILINTA)  90 MG TABS tablet Take 1 tablet (90 mg total) by mouth 2 (two) times daily. 60 tablet 0  . gemfibrozil (LOPID) 600 MG tablet Take 1 tablet (600 mg total) by mouth 2 (two) times daily before a meal. (Patient not taking: Reported on 01/15/2018) 30 tablet 0  . nitroGLYCERIN (NITROSTAT) 0.4 MG SL tablet Place 0.4 mg under the tongue every 5 (five) minutes as needed for chest pain.     No facility-administered medications prior to visit.      Allergies:   Bee venom and Crestor [rosuvastatin]   Social History   Socioeconomic History  . Marital status: Divorced    Spouse name: Not on file  . Number of children: Not on file  . Years of education: Not on file  . Highest education level: Not on file  Occupational History  Comment:      Employer: [employer]  Social Needs  . Financial resource strain: Not on file  . Food insecurity:    Worry: Not on file    Inability: Not on file  . Transportation needs:    Medical: Not on file    Non-medical: Not on file  Tobacco Use  . Smoking status: Current Some Day Smoker    Packs/day: 0.50    Years: 26.00    Pack years: 13.00    Types: Cigarettes    Start date: 03/13/1985  . Smokeless tobacco: Former Systems developer    Types: Snuff, Chew    Quit date: 03/14/1987  . Tobacco comment: 8 PER DAY  Substance and Sexual Activity  . Alcohol use: No  . Drug use: No  . Sexual activity: Not on file  Lifestyle  . Physical activity:    Days per week: Not on file    Minutes per session: Not on file  . Stress: Not on file  Relationships  . Social connections:    Talks on phone: Not on file    Gets together: Not on file    Attends religious service: Not on file    Active member of club or organization: Not on file    Attends meetings of clubs or organizations: Not on file    Relationship status: Not on file  Other Topics Concern  . Not on file  Social History Narrative   Eats fast food daily   No exercise outside of work   Lives in [place] with his dog    MAINTENANCE TECHNICIAN     Family History:  The patient's family history includes Arrhythmia in his father; Congestive Heart Failure (age of onset: 74) in his father; Coronary artery disease in his father, maternal grandfather, and unknown relative; Diabetes in his father, mother, and sister; Hyperlipidemia in his father and mother; Hypertension in his father and mother; Obesity (age of onset: 56) in his mother; Stroke in his maternal aunt and maternal uncle.   Review of Systems:   Please see the history of present illness.     General:  No chills, fever, night sweats or weight changes.  Cardiovascular:  No chest pain, edema, orthopnea, palpitations, paroxysmal nocturnal dyspnea. Positive for dyspnea on exertion.  Dermatological: No rash, lesions/masses Respiratory: No cough, dyspnea Urologic: No hematuria, dysuria Abdominal:   No nausea, vomiting, diarrhea, bright red blood per rectum, melena, or hematemesis Neurologic:  No visual changes, wkns, changes in mental status. Positive for headaches and dizziness.   All other systems reviewed and are otherwise negative except as noted above.   Physical Exam:    VS:  BP (!) 146/86   Pulse 69   Ht 6' 6"  (1.981 m)   Wt 269 lb 9.6 oz (122.3 kg)   SpO2 99%   BMI 31.16 kg/m    General: Well developed, well nourished Caucasian male appearing in no acute distress. Head: Normocephalic, atraumatic, sclera non-icteric, no xanthomas, nares are without discharge.  Neck: No carotid bruits. JVD not elevated.  Lungs: Respirations regular and unlabored, without wheezes or rales.  Heart: Regular rate and rhythm. No S3 or S4.  No murmur, no rubs, or gallops appreciated. Abdomen: Soft, non-tender, non-distended with normoactive bowel sounds. No hepatomegaly. No rebound/guarding. No obvious abdominal masses. Msk:  Strength and tone appear normal for age. No joint deformities or effusions. Extremities: No clubbing or cyanosis. No lower extremity edema.   Distal pedal pulses are 2+ bilaterally. Neuro: Alert and  oriented X 3. Moves all extremities spontaneously. No focal deficits noted. Psych:  Responds to questions appropriately with a normal affect. Skin: No rashes or lesions noted  Wt Readings from Last 3 Encounters:  01/15/18 269 lb 9.6 oz (122.3 kg)  01/08/18 278 lb (126.1 kg)  01/04/18 279 lb 6.4 oz (126.7 kg)     Studies/Labs Reviewed:   EKG:  EKG is not ordered today.   Recent Labs: 01/05/2018: TSH 60.657 01/06/2018: ALT 16 01/07/2018: BUN 14; Creatinine, Ser 1.23; Hemoglobin 13.6; Platelets 325; Potassium 4.2; Sodium 137   Lipid Panel    Component Value Date/Time   CHOL 260 (H) 01/06/2018 0556   TRIG 793 (H) 01/06/2018 0556   HDL 25 (L) 01/06/2018 0556   CHOLHDL 10.4 01/06/2018 0556   VLDL UNABLE TO CALCULATE IF TRIGLYCERIDE OVER 400 mg/dL 01/06/2018 0556   LDLCALC UNABLE TO CALCULATE IF TRIGLYCERIDE OVER 400 mg/dL 01/06/2018 0556    Additional studies/ records that were reviewed today include:   Cardiac Catheterization: 11/15/2017  There is moderate left ventricular systolic dysfunction. The left ventricular ejection fraction is 35-45% by visual estimate.  LV end diastolic pressure is mildly elevated.  -----------------------------------------  ANGIOGRAPHY- PCI  LESION #1: Mid LAD lesion is 80% stenosed.  A drug-eluting stent was successfully placed using a STENT SYNERGY DES 2.25X16. -Postdilated to 2.4 mm  Post intervention, there is a 0% residual stenosis.  LESION #2: mid LAD to Dist LAD lesion is 90% stenosed.  A drug-eluting stent was successfully placed using a STENT SYNERGY DES 2.25X12. -Postdilated to 2.4 mm  Post intervention, there is a 0% residual stenosis.  Prox LAD lesion is 40% stenosed -prior to lesion 1; apical, after lesion 2  A drug-eluting stent was successfully placed using a STENT SYNERGY DES 2.25X12. -Postdilated to 2.4 mm  -----------------------------------------  Post  intervention, there is a 0% residual stenosis.  Trifurcation 90% lesion at the distal circumflex into LPL and PDA -small caliber distal vessels. Best treated medically as this is not a very good PCI target  Prox small caliber nondominant RCA lesion is 99% stenosed.   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   Subtotally occluded nondominant RCA Moderately reduced LVEF of roughly 40% with mildly elevated LVEDP.  Plan: Transfer to postprocedure unit for post PCI monitoring and TR removal Continue aggressive risk factor modification with statin and beta-blocker etc.  Recommend uninterrupted dual antiplatelet therapy with Aspirin 47m daily and Ticagrelor 96mtwice daily for a minimum of 12 months (ACS - Class I recommendation). -Would strongly consider reducing to 60 mg dose ticagrelor at 9-12 months and continue for an additional year.   Would be okay to stop aspirin after 3 months if necessary.  Okay to stop Brilinta after 6 months for procedures if necessary.  Echocardiogram: 11/23/2017 Study Conclusions  - Left ventricle: The cavity size was mildly dilated. There was   mild concentric hypertrophy. Systolic function was moderately   reduced. The estimated ejection fraction was in the range of 35%   to 40%. Diffuse hypokinesis. Features are consistent with a   pseudonormal left ventricular filling pattern, with concomitant   abnormal relaxation and increased filling pressure (grade 2   diastolic dysfunction). Doppler parameters are consistent with   high ventricular filling pressure. - Regional wall motion abnormality: Moderate hypokinesis of the mid   anterior, basal  inferior, mid inferolateral, and mid   anterolateral myocardium. - Mitral valve: Mildly calcified annulus. - Atrial septum: No defect or patent foramen ovale was  identified.  Assessment:    1. Coronary artery disease involving native coronary artery of native heart without angina pectoris   2. Ischemic cardiomyopathy   3. Hyperlipidemia LDL goal <70   4. Type 2 diabetes mellitus with other specified complication, without long-term current use of insulin ([place])   5. Cerebellar stroke ([place])   6. Tobacco use      Plan:   In order of problems listed above:  1. CAD - s/p DES x2 to LAD and DES to OM in 11/2017 as outlined above. Has baseline dyspnea on exertion but denies any recent changes in this. No recurrent chest pain.  - continue ASA and Brilinta given recent stent placement (patient reports he was approved for patient assistance with Brilinta). Not on BB therapy given baseline bradycardia. Continue Zetia and Gemfibrozil as outlined below due to statin intolerance.   2. Ischemic Cardiomyopathy - The patient has a known reduced EF of 35 to 40% by recent echocardiogram in 11/2017. He has baseline dyspnea on exertion but denies any recent orthopnea, PND, or lower extremity edema. Weight has actually declined on his home scales over the past several weeks. He appears euvolemic by examination today. - He has not been on beta-blocker therapy given baseline bradycardia (HR typically in the low-to-mid 50's). Entresto recently discontinued due to dizziness and hypotension in the setting of acute CVA. BP improved to 146/86 during today's visit. Will plan to try the addition of Losartan 12.5 mg daily. Has repeat labs with his PCP in the coming weeks and a repeat BMET can be obtained at that time (creatinine 1.1 - 1.2 during recent admission). I have asked him to continue to follow BP in the ambulatory setting for would prefer to switch back to [facility] in the future but want to make sure BP will tolerate given his recent medical issues. Would plan for a repeat echocardiogram in 2-3 months once medical therapy has been optimized.   3. HLD - FLP during recent  admission showed total cholesterol 260, triglycerides 793, HDL 25, and LDL was unable to be calculated due to his significant triglyceride elevation. LDL was elevated to 205 in 11/2017. Intolerant to statins in the past due to rhabdomyolysis and recently started on Zetia and Gemfibrozil during admission. Plan for repeat FLP and LFT's in 6-8 weeks. If not at goal of < 70, will refer to the [facility] Clinic. He currently does not have insurance coverage so unless able to obtain with patient assistance programs, PCSK-9 inhibitor options may be limited.   4. Type 2 DM - Hgb A1c elevated to 7.9 in 12/2017, improved from prior labs in 11/2017 as elevated to 9.1 at that time. [facility]   5. Recent CVA/ Basilar Artery Stenosis - s/p revascularization with stent angioplasty on 12/03/2017 by Dr. [name]. Admitted again in 12/2017 for an acute CVA as MRI showed multiple small foci of ischemia within the posterior circulation. He reports having a constant headache since admission but says this had been occurring for the past year. No associated symptoms at this time.  - In reviewing Neurology notes during admission, a 30-day cardiac event monitor was not mentioned. Will message the Stroke Team today to see if they wish for him to have a 30-day cardiac event monitor as he is at risk for arrhythmias in the  setting of his CAD and cardiomyopathy.  ADDENDUM: Reviewed with Dr. Leonie Man. Monitor not felt to be necessary given both strokes were due to occlusive posterior circulation disease.   6. Tobacco Use - he was previously smoking 2-3 ppd prior to stent placement but has reduced his use to less than 6-7 cigarettes per day. Congratulated on his reduction with full cessation advised.     Medication Adjustments/Labs and Tests Ordered: Current medicines are reviewed at length with the patient today.  Concerns regarding medicines are outlined above.  Medication changes, Labs and Tests ordered  today are listed in the Patient Instructions below. Patient Instructions  Medication Instructions:  Your physician has recommended you make the following change in your medication:  Losartan 12.5 mg Daily   If you need a refill on your cardiac medications before your next appointment, please call your pharmacy.   Lab work: Your physician recommends that you return for lab work in: 2 Months ( Just before your next visit)   If you have labs (blood work) drawn today and your tests are completely normal, you will receive your results only by: Marland Kitchen MyChart Message (if you have MyChart) OR . A paper copy in the mail If you have any lab test that is abnormal or we need to change your treatment, we will call you to review the results.  Testing/Procedures: NONE   Follow-Up: At [facility], you and your health needs are our priority.  As part of our continuing mission to provide you with exceptional heart care, we have created designated Provider Care Teams.  These Care Teams include your primary Cardiologist (physician) and Advanced Practice Providers (APPs -  Physician Assistants and Nurse Practitioners) who all work together to provide you with the care you need, when you need it. You will need a follow up appointment in 2 months.  Please call our office 2 months in advance to schedule this appointment.  You may see Carlyle Dolly, MD or one of the following Advanced Practice Providers on your designated Care Team:   Bernerd Pho, PA-C [facility]) . Ermalinda Barrios, PA-C ([place])  Any Other Special Instructions Will Be Listed Below (If Applicable). Thank you for choosing [facility]!     Signed, Erma Heritage, PA-C  01/15/2018 1:02 PM    [place] [address] [place], [place] [address] Phone: [phone]

## 2018-02-08 ENCOUNTER — Emergency Department (HOSPITAL_COMMUNITY): Payer: Self-pay

## 2018-02-08 ENCOUNTER — Inpatient Hospital Stay (HOSPITAL_COMMUNITY)
Admission: EM | Admit: 2018-02-08 | Discharge: 2018-02-13 | DRG: 065 | Disposition: A | Discharge status: Home / Self Care | Payer: Self-pay | Attending: Internal Medicine | Admitting: Internal Medicine

## 2018-02-08 ENCOUNTER — Encounter (HOSPITAL_COMMUNITY): Payer: Self-pay

## 2018-02-08 DIAGNOSIS — Z6831 Body mass index (BMI) 31.0-31.9, adult: Secondary | ICD-10-CM

## 2018-02-08 DIAGNOSIS — Z955 Presence of coronary angioplasty implant and graft: Secondary | ICD-10-CM

## 2018-02-08 DIAGNOSIS — I10 Essential (primary) hypertension: Secondary | ICD-10-CM | POA: Diagnosis present

## 2018-02-08 DIAGNOSIS — I651 Occlusion and stenosis of basilar artery: Secondary | ICD-10-CM | POA: Diagnosis present

## 2018-02-08 DIAGNOSIS — I5042 Chronic combined systolic (congestive) and diastolic (congestive) heart failure: Secondary | ICD-10-CM | POA: Diagnosis present

## 2018-02-08 DIAGNOSIS — Z95828 Presence of other vascular implants and grafts: Secondary | ICD-10-CM

## 2018-02-08 DIAGNOSIS — Z91030 Bee allergy status: Secondary | ICD-10-CM

## 2018-02-08 DIAGNOSIS — R200 Anesthesia of skin: Secondary | ICD-10-CM

## 2018-02-08 DIAGNOSIS — G4733 Obstructive sleep apnea (adult) (pediatric): Secondary | ICD-10-CM | POA: Diagnosis present

## 2018-02-08 DIAGNOSIS — E1140 Type 2 diabetes mellitus with diabetic neuropathy, unspecified: Secondary | ICD-10-CM | POA: Diagnosis present

## 2018-02-08 DIAGNOSIS — F17210 Nicotine dependence, cigarettes, uncomplicated: Secondary | ICD-10-CM | POA: Diagnosis present

## 2018-02-08 DIAGNOSIS — Z888 Allergy status to other drugs, medicaments and biological substances status: Secondary | ICD-10-CM

## 2018-02-08 DIAGNOSIS — E119 Type 2 diabetes mellitus without complications: Secondary | ICD-10-CM

## 2018-02-08 DIAGNOSIS — I2510 Atherosclerotic heart disease of native coronary artery without angina pectoris: Secondary | ICD-10-CM | POA: Diagnosis present

## 2018-02-08 DIAGNOSIS — I639 Cerebral infarction, unspecified: Secondary | ICD-10-CM

## 2018-02-08 DIAGNOSIS — E039 Hypothyroidism, unspecified: Secondary | ICD-10-CM | POA: Diagnosis present

## 2018-02-08 DIAGNOSIS — D571 Sickle-cell disease without crisis: Secondary | ICD-10-CM | POA: Diagnosis present

## 2018-02-08 DIAGNOSIS — I110 Hypertensive heart disease with heart failure: Secondary | ICD-10-CM | POA: Diagnosis present

## 2018-02-08 DIAGNOSIS — E038 Other specified hypothyroidism: Secondary | ICD-10-CM

## 2018-02-08 DIAGNOSIS — G8194 Hemiplegia, unspecified affecting left nondominant side: Secondary | ICD-10-CM | POA: Diagnosis present

## 2018-02-08 DIAGNOSIS — Z7984 Long term (current) use of oral hypoglycemic drugs: Secondary | ICD-10-CM

## 2018-02-08 DIAGNOSIS — Z7982 Long term (current) use of aspirin: Secondary | ICD-10-CM

## 2018-02-08 DIAGNOSIS — J45909 Unspecified asthma, uncomplicated: Secondary | ICD-10-CM | POA: Diagnosis present

## 2018-02-08 DIAGNOSIS — R29702 NIHSS score 2: Secondary | ICD-10-CM | POA: Diagnosis present

## 2018-02-08 DIAGNOSIS — I255 Ischemic cardiomyopathy: Secondary | ICD-10-CM | POA: Diagnosis present

## 2018-02-08 DIAGNOSIS — E669 Obesity, unspecified: Secondary | ICD-10-CM | POA: Diagnosis present

## 2018-02-08 DIAGNOSIS — E1159 Type 2 diabetes mellitus with other circulatory complications: Secondary | ICD-10-CM

## 2018-02-08 DIAGNOSIS — Z79899 Other long term (current) drug therapy: Secondary | ICD-10-CM

## 2018-02-08 DIAGNOSIS — I63219 Cerebral infarction due to unspecified occlusion or stenosis of unspecified vertebral arteries: Principal | ICD-10-CM | POA: Diagnosis present

## 2018-02-08 DIAGNOSIS — R29818 Other symptoms and signs involving the nervous system: Secondary | ICD-10-CM | POA: Diagnosis present

## 2018-02-08 DIAGNOSIS — Z7902 Long term (current) use of antithrombotics/antiplatelets: Secondary | ICD-10-CM

## 2018-02-08 DIAGNOSIS — Z8673 Personal history of transient ischemic attack (TIA), and cerebral infarction without residual deficits: Secondary | ICD-10-CM

## 2018-02-08 DIAGNOSIS — R202 Paresthesia of skin: Secondary | ICD-10-CM | POA: Diagnosis present

## 2018-02-08 LAB — DIFFERENTIAL
Abs Immature Granulocytes: 0.02 10*3/uL (ref 0.00–0.07)
Basophils Absolute: 0.1 10*3/uL (ref 0.0–0.1)
Basophils Relative: 1 %
EOS ABS: 0.4 10*3/uL (ref 0.0–0.5)
EOS PCT: 4 %
Immature Granulocytes: 0 %
Lymphocytes Relative: 43 %
Lymphs Abs: 4.2 10*3/uL — ABNORMAL HIGH (ref 0.7–4.0)
MONO ABS: 0.6 10*3/uL (ref 0.1–1.0)
Monocytes Relative: 6 %
Neutro Abs: 4.6 10*3/uL (ref 1.7–7.7)
Neutrophils Relative %: 46 %

## 2018-02-08 LAB — CBC
HCT: 42.9 % (ref 39.0–52.0)
Hemoglobin: 13.6 g/dL (ref 13.0–17.0)
MCH: 28.4 pg (ref 26.0–34.0)
MCHC: 31.7 g/dL (ref 30.0–36.0)
MCV: 89.6 fL (ref 80.0–100.0)
PLATELETS: 352 10*3/uL (ref 150–400)
RBC: 4.79 MIL/uL (ref 4.22–5.81)
RDW: 13.7 % (ref 11.5–15.5)
WBC: 9.9 10*3/uL (ref 4.0–10.5)
nRBC: 0 % (ref 0.0–0.2)

## 2018-02-08 LAB — PROTIME-INR
INR: 0.98
Prothrombin Time: 12.9 seconds (ref 11.4–15.2)

## 2018-02-08 LAB — COMPREHENSIVE METABOLIC PANEL
ALT: 15 U/L (ref 0–44)
ANION GAP: 11 (ref 5–15)
AST: 16 U/L (ref 15–41)
Albumin: 4.5 g/dL (ref 3.5–5.0)
Alkaline Phosphatase: 63 U/L (ref 38–126)
BUN: 18 mg/dL (ref 6–20)
CO2: 24 mmol/L (ref 22–32)
Calcium: 9.6 mg/dL (ref 8.9–10.3)
Chloride: 104 mmol/L (ref 98–111)
Creatinine, Ser: 1.21 mg/dL (ref 0.61–1.24)
GFR calc Af Amer: >60 mL/min (ref >60)
GFR calc non Af Amer: >60 mL/min (ref >60)
Glucose, Bld: 114 mg/dL — ABNORMAL HIGH (ref 70–99)
POTASSIUM: 3.8 mmol/L (ref 3.5–5.1)
Sodium: 139 mmol/L (ref 135–145)
Total Bilirubin: 0.4 mg/dL (ref 0.3–1.2)
Total Protein: 8.6 g/dL — ABNORMAL HIGH (ref 6.5–8.1)

## 2018-02-08 LAB — I-STAT CHEM 8, ED
BUN: 20 mg/dL (ref 6–20)
CHLORIDE: 105 mmol/L (ref 98–111)
CREATININE: 1.3 mg/dL — AB (ref 0.61–1.24)
Calcium, Ion: 1.22 mmol/L (ref 1.15–1.40)
GLUCOSE: 109 mg/dL — AB (ref 70–99)
HEMATOCRIT: 42 % (ref 39.0–52.0)
HEMOGLOBIN: 14.3 g/dL (ref 13.0–17.0)
Potassium: 3.9 mmol/L (ref 3.5–5.1)
Sodium: 140 mmol/L (ref 135–145)
TCO2: 28 mmol/L (ref 22–32)

## 2018-02-08 LAB — CBG MONITORING, ED: Glucose-Capillary: 116 mg/dL — ABNORMAL HIGH (ref 70–99)

## 2018-02-08 LAB — APTT: APTT: 34 s (ref 24–36)

## 2018-02-08 LAB — ETHANOL

## 2018-02-08 LAB — I-STAT TROPONIN, ED: Troponin i, poc: 0 ng/mL (ref 0.00–0.08)

## 2018-02-08 MED ORDER — HYDROCODONE-ACETAMINOPHEN 5-325 MG PO TABS: 1.0000 | ORAL_TABLET | Freq: Four times a day (QID) | ORAL | Status: DC | PRN

## 2018-02-08 MED ORDER — SODIUM CHLORIDE 0.9 % IV SOLN: 250.0000 mL | INTRAVENOUS | Status: DC | PRN

## 2018-02-08 MED ORDER — ACETAMINOPHEN 160 MG/5ML PO SOLN: 650.0000 mg | ORAL | Status: DC | PRN

## 2018-02-08 MED ORDER — GABAPENTIN 300 MG PO CAPS
300.0000 mg | ORAL_CAPSULE | Freq: Two times a day (BID) | ORAL | Status: DC
  Administered 2018-02-09 – 2018-02-12 (×9): 300 mg via ORAL
  Filled 2018-02-08 (×9): qty 1

## 2018-02-08 MED ORDER — ACETAMINOPHEN 650 MG RE SUPP: 650.0000 mg | RECTAL | Status: DC | PRN

## 2018-02-08 MED ORDER — IOPAMIDOL (ISOVUE-370) INJECTION 76%
125.0000 mL | Freq: Once | INTRAVENOUS | Status: AC | PRN
  Administered 2018-02-08: 125 mL via INTRAVENOUS

## 2018-02-08 MED ORDER — ACETAMINOPHEN 325 MG PO TABS
650.0000 mg | ORAL_TABLET | ORAL | Status: DC | PRN
  Administered 2018-02-09 (×2): 650 mg via ORAL
  Filled 2018-02-08 (×2): qty 2

## 2018-02-08 MED ORDER — LEVOTHYROXINE SODIUM 50 MCG PO TABS
175.0000 ug | ORAL_TABLET | Freq: Every day | ORAL | Status: DC
  Administered 2018-02-09 – 2018-02-13 (×5): 175 ug via ORAL
  Filled 2018-02-08 (×5): qty 4

## 2018-02-08 MED ORDER — TICAGRELOR 90 MG PO TABS
90.0000 mg | ORAL_TABLET | Freq: Two times a day (BID) | ORAL | Status: DC
  Administered 2018-02-09 – 2018-02-12 (×8): 90 mg via ORAL
  Filled 2018-02-08 (×8): qty 1

## 2018-02-08 MED ORDER — SENNOSIDES-DOCUSATE SODIUM 8.6-50 MG PO TABS: 1.0000 | ORAL_TABLET | Freq: Every evening | ORAL | Status: DC | PRN

## 2018-02-08 MED ORDER — ASPIRIN EC 81 MG PO TBEC
81.0000 mg | DELAYED_RELEASE_TABLET | Freq: Every day | ORAL | Status: DC
  Administered 2018-02-09 – 2018-02-11 (×3): 81 mg via ORAL
  Filled 2018-02-08 (×3): qty 1

## 2018-02-08 MED ORDER — PANTOPRAZOLE SODIUM 40 MG PO TBEC
40.0000 mg | DELAYED_RELEASE_TABLET | Freq: Every morning | ORAL | Status: DC
  Administered 2018-02-09 – 2018-02-12 (×4): 40 mg via ORAL
  Filled 2018-02-08 (×4): qty 1

## 2018-02-08 MED ORDER — SODIUM CHLORIDE 0.9% FLUSH: 3.0000 mL | INTRAVENOUS | Status: DC | PRN

## 2018-02-08 MED ORDER — SODIUM CHLORIDE 0.9% FLUSH
3.0000 mL | Freq: Two times a day (BID) | INTRAVENOUS | Status: DC
  Administered 2018-02-09 – 2018-02-12 (×6): 3 mL via INTRAVENOUS

## 2018-02-08 MED ORDER — CITALOPRAM HYDROBROMIDE 10 MG PO TABS
10.0000 mg | ORAL_TABLET | Freq: Every day | ORAL | Status: DC
  Administered 2018-02-09 – 2018-02-12 (×4): 10 mg via ORAL
  Filled 2018-02-08 (×4): qty 1

## 2018-02-08 MED ORDER — EZETIMIBE 10 MG PO TABS
10.0000 mg | ORAL_TABLET | Freq: Every day | ORAL | Status: DC
  Administered 2018-02-09 – 2018-02-11 (×3): 10 mg via ORAL
  Filled 2018-02-08 (×3): qty 1

## 2018-02-08 MED ORDER — INSULIN ASPART 100 UNIT/ML ~~LOC~~ SOLN
0.0000 [IU] | Freq: Three times a day (TID) | SUBCUTANEOUS | Status: DC
  Administered 2018-02-10 (×2): 1 [IU] via SUBCUTANEOUS
  Administered 2018-02-10 – 2018-02-11 (×2): 2 [IU] via SUBCUTANEOUS
  Administered 2018-02-13: 3 [IU] via SUBCUTANEOUS

## 2018-02-08 MED ORDER — INSULIN ASPART 100 UNIT/ML ~~LOC~~ SOLN
0.0000 [IU] | Freq: Every day | SUBCUTANEOUS | Status: DC
  Administered 2018-02-09 – 2018-02-11 (×2): 2 [IU] via SUBCUTANEOUS

## 2018-02-08 MED ORDER — ONDANSETRON HCL 4 MG/2ML IJ SOLN: 4.0000 mg | Freq: Four times a day (QID) | INTRAMUSCULAR | Status: DC | PRN

## 2018-02-08 MED ORDER — ENOXAPARIN SODIUM 40 MG/0.4ML ~~LOC~~ SOLN
40.0000 mg | SUBCUTANEOUS | Status: AC
  Administered 2018-02-09 – 2018-02-11 (×3): 40 mg via SUBCUTANEOUS
  Filled 2018-02-08 (×3): qty 0.4

## 2018-02-08 MED ORDER — STROKE: EARLY STAGES OF RECOVERY BOOK
Freq: Once | Status: AC
  Administered 2018-02-09: 02:00:00
  Filled 2018-02-08 (×2): qty 1

## 2018-02-08 NOTE — H&P (Signed)
History and Physical    Devin [name] UDJ:[id number] DOB: [birth year] DOA: 02/08/2018  PCP: [facility]   Patient coming from: Home   Chief Complaint: Right facial numbness, headache   HPI: Devin [name] is a 48 y.o. 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  Patient reports that he was in his usual state of health, having an uneventful day, and developed acute onset of numbness involving his right face.  He also reports a mild generalized headache associated with this.  He denies any change in vision or hearing, and denies any numbness or weakness in the extremities.  Family has not appreciated any change in his speech.  Symptoms developed approximately 30 minutes prior to arrival in the ED.  Patient denies chest pain or palpitations.  Reports adherence with his medications.  ED Course: Upon arrival to the ED, patient is found to be afebrile, saturating well on room air, and mildly hypertensive.  EKG features a sinus rhythm, chemistry panel is unremarkable, and CBC is unremarkable.  Noncontrast head CT is negative for acute intracranial abnormality.  CTA of the head and neck is negative for large vessel occlusion, notable for 50% narrowing in the distal stent.  Neurology was consulted by the ED physician and recommended medical admission to [facility] for further evaluation and management.  Review of Systems:  All other systems reviewed and apart from HPI, are negative.  Past Medical History:  Diagnosis Date  . Asthma   . Chest pain   . Coronary artery disease    a. s/p DES x2 to LAD and DES to OM in 11/2017  . Dyspnea   . GERD (gastroesophageal  reflux disease)   . History of kidney stones   . History of rhabdomyolysis   . Hyperlipidemia   . Hypertension   . Hypothyroidism   . Ischemic cardiomyopathy    a. 11/2017: echo showing EF of 35-40%, diffuse HK, and Grade 2 DD  . Kidney calculus 2014  . Pericardial effusion    Small, by dobutamine echocardiogram, 04/2006  . Stroke ([place])   . Tobacco abuse   . Type 2 diabetes mellitus without complications ([place]) 5/88/5027  . Vitamin D deficiency     Past Surgical History:  Procedure Laterality Date  . 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 CHOLECYSTECTOMY N/A 01/27/2013   Procedure: LAPAROSCOPIC CHOLECYSTECTOMY;  Surgeon: Jamesetta So, MD;  Location: [facility];  Service: General;  Laterality: N/A;  . CORONARY STENT INTERVENTION N/A 11/15/2017   Procedure: CORONARY STENT INTERVENTION;  Surgeon: Leonie Man, MD;  Location: [place] [facility];  Service: Cardiovascular;  Laterality: N/A;  . HERNIA REPAIR     As a child  . INSERTION OF MESH N/A 11/13/2012   Procedure: INSERTION OF MESH;  Surgeon: Jamesetta So, MD;  Location: [facility];  Service: General;  Laterality: N/A;  . IR ANGIO INTRA EXTRACRAN SEL COM CAROTID INNOMINATE BILAT MOD SED  12/01/2017  . IR ANGIO VERTEBRAL SEL VERTEBRAL BILAT MOD SED  12/01/2017  . IR INTRA CRAN STENT  12/03/2017  . KIDNEY STONE SURGERY    . LEFT HEART CATH AND CORONARY ANGIOGRAPHY N/A  11/15/2017   Procedure: LEFT HEART CATH AND CORONARY ANGIOGRAPHY;  Surgeon: Leonie Man, MD;  Location: [place];  Service: Cardiovascular;  Laterality: N/A;  . PERCUTANEOUS NEPHROLITHOTRIPSY    . RADIOLOGY WITH ANESTHESIA Left 12/03/2017   Procedure: Angioplasty with possible stenting of left VBJ;  Surgeon: Luanne Bras, MD;  Location: [facility];  Service: Radiology;  Laterality: Left;  . UMBILICAL HERNIA REPAIR N/A 11/13/2012   Procedure: UMBILICAL HERNIORRHAPHY;  Surgeon:  Jamesetta So, MD;  Location: [facility];  Service: General;  Laterality: N/A;  . VARICOCELECTOMY       reports that he has been smoking cigarettes. He started smoking about 32 years ago. He has a 13.00 pack-year smoking history. He quit smokeless tobacco use about 30 years ago.  His smokeless tobacco use included snuff and chew. He reports that he does not drink alcohol or use drugs.  Allergies  Allergen Reactions  . Bee Venom Swelling  . Crestor [Rosuvastatin] Other (See Comments)    rhabdomyolosis    Family History  Problem Relation Age of Onset  . Coronary artery disease Father   . Hypertension Father   . Hyperlipidemia Father   . Diabetes Father   . Congestive Heart Failure Father 59  . Arrhythmia Father        had an ICD  . Diabetes Mother   . Hypertension Mother   . Hyperlipidemia Mother   . Obesity Mother 38       died after bariatric surgery, liver failure and infection  . Coronary artery disease Maternal Grandfather        both grandfathers and several uncles  . Coronary artery disease Unknown   . Diabetes Sister        both sisters  . Stroke Maternal Aunt   . Stroke Maternal Uncle      Prior to Admission medications   Medication Sig Start Date End Date Taking? Authorizing Provider  aspirin 81 MG EC tablet Take 1 tablet (81 mg total) by mouth daily. With food 12/05/17  Yes Roxan Hockey, MD  Blood Glucose Monitoring Suppl w/Device KIT Dispense based on patient and insurance preference. Check blood sugar daily before breakfast (ICD9 250.0) 04/29/17  Yes Pollina, Gwenyth Allegra, MD  citalopram (CELEXA) 10 MG tablet Take 1 tablet (10 mg total) by mouth daily. 01/04/18  Yes Venancio Poisson, NP  ezetimibe (ZETIA) 10 MG tablet Take 1 tablet (10 mg total) by mouth daily. 12/06/17  Yes Emokpae, Courage, MD  gabapentin (NEURONTIN) 300 MG capsule Take 1 capsule (300 mg total) by mouth 2 (two) times daily. 01/04/18  Yes Venancio Poisson, NP  glimepiride (AMARYL) 2 MG  tablet Take 1 tablet (2 mg total) by mouth daily with breakfast. 11/16/17  Yes Patrecia Pour, MD  levothyroxine (SYNTHROID, LEVOTHROID) 175 MCG tablet Take 175 mcg by mouth daily before breakfast.   Yes [provider]  losartan (COZAAR) 25 MG tablet Take 0.5 tablets (12.5 mg total) by mouth daily. 01/15/18  Yes Strader, [place] M, PA-C  nitroGLYCERIN (NITROSTAT) 0.4 MG SL tablet Place 1 tablet (0.4 mg total) under the tongue every 5 (five) minutes as needed for chest pain. 01/15/18  Yes Strader, [place], PA-C  pantoprazole (PROTONIX) 40 MG tablet Take 40 mg by mouth every morning.   Yes [provider]  sitaGLIPtin-metformin (JANUMET) 50-1000 MG tablet Take 1 tablet by mouth 2 (two) times daily with a meal.   Yes [provider]  ticagrelor (BRILINTA) 90 MG TABS  tablet Take 1 tablet (90 mg total) by mouth 2 (two) times daily. 11/16/17  Yes Patrecia Pour, MD  gemfibrozil (LOPID) 600 MG tablet Take 1 tablet (600 mg total) by mouth 2 (two) times daily before a meal. Patient not taking: Reported on 01/15/2018 01/08/18   Elmarie Shiley, MD    Physical Exam: Vitals:   02/08/18 2136 02/08/18 2240 02/08/18 2300 02/08/18 2341  BP: (!) 140/93 136/84 118/82 124/67  Pulse: 69 74 76 76  Resp: 14 18 20    Temp:      TempSrc:      SpO2: 100% 98% 96% 95%    Constitutional: NAD, calm  Eyes: PERTLA, lids and conjunctivae normal ENMT: Mucous membranes are moist. Posterior pharynx clear of any exudate or lesions.   Neck: normal, supple, no masses, no thyromegaly Respiratory: clear to auscultation bilaterally, no wheezing, no crackles. Normal respiratory effort.   Cardiovascular: S1 & S2 heard, regular rate and rhythm. No extremity edema.  Abdomen: No distension, no tenderness, soft. Bowel sounds normal.  Musculoskeletal: no clubbing / cyanosis. No joint deformity upper and lower extremities.    Skin: hyperkeratosis about the extremities and back. Warm, dry,  well-perfused. Neurologic: No facial asymmetry. Sensation to light touch diminished in right face. Patellar DTRs intact. Subtle left hand and left leg weakness.  Psychiatric: Alert and oriented x 3. Pleasant and cooperative.    Labs on Admission: I have personally reviewed following labs and imaging studies  CBC: Recent Labs  Lab 02/08/18 2104 02/08/18 2107  WBC 9.9  --   NEUTROABS 4.6  --   HGB 13.6 14.3  HCT 42.9 42.0  MCV 89.6  --   PLT 352  --    Basic Metabolic Panel: Recent Labs  Lab 02/08/18 2104 02/08/18 2107  NA 139 140  K 3.8 3.9  CL 104 105  CO2 24  --   GLUCOSE 114* 109*  BUN 18 20  CREATININE 1.21 1.30*  CALCIUM 9.6  --    GFR: CrCl cannot be calculated (Unknown ideal weight.). Liver Function Tests: Recent Labs  Lab 02/08/18 2104  AST 16  ALT 15  ALKPHOS 63  BILITOT 0.4  PROT 8.6*  ALBUMIN 4.5   No results for input(s): LIPASE, AMYLASE in the last 168 hours. No results for input(s): AMMONIA in the last 168 hours. Coagulation Profile: Recent Labs  Lab 02/08/18 2104  INR 0.98   Cardiac Enzymes: No results for input(s): CKTOTAL, CKMB, CKMBINDEX, TROPONINI in the last 168 hours. BNP (last 3 results) No results for input(s): PROBNP in the last 8760 hours. HbA1C: No results for input(s): HGBA1C in the last 72 hours. CBG: Recent Labs  Lab 02/08/18 2057  GLUCAP 116*   Lipid Profile: No results for input(s): CHOL, HDL, LDLCALC, TRIG, CHOLHDL, LDLDIRECT in the last 72 hours. Thyroid Function Tests: No results for input(s): TSH, T4TOTAL, FREET4, T3FREE, THYROIDAB in the last 72 hours. Anemia Panel: No results for input(s): VITAMINB12, FOLATE, FERRITIN, TIBC, IRON, RETICCTPCT in the last 72 hours. Urine analysis:    Component Value Date/Time   COLORURINE YELLOW 01/05/2018 [place] 01/05/2018 1652   LABSPEC >1.046 (H) 01/05/2018 1652   PHURINE 6.0 01/05/2018 1652   GLUCOSEU NEGATIVE 01/05/2018 1652   HGBUR NEGATIVE  01/05/2018 1652   BILIRUBINUR NEGATIVE 01/05/2018 1652   KETONESUR NEGATIVE 01/05/2018 1652   PROTEINUR NEGATIVE 01/05/2018 1652   UROBILINOGEN 0.2 10/06/2013 0305   NITRITE NEGATIVE 01/05/2018 1652   LEUKOCYTESUR NEGATIVE 01/05/2018  1652   Sepsis Labs: @LABRCNTIP (procalcitonin:4,lacticidven:4) )No results found for this or any previous visit (from the past 240 hour(s)).   Radiological Exams on Admission: Ct Angio Head W Or Wo Contrast  Result Date: 02/08/2018 CLINICAL DATA:  Initial evaluation for acute right facial numbness. EXAM: CT ANGIOGRAPHY HEAD AND NECK CT PERFUSION BRAIN TECHNIQUE: Multidetector CT imaging of the head and neck was performed using the standard protocol during bolus administration of intravenous contrast. Multiplanar CT image reconstructions and MIPs were obtained to evaluate the vascular anatomy. Carotid stenosis measurements (when applicable) are obtained utilizing NASCET criteria, using the distal internal carotid diameter as the denominator. Multiphase CT imaging of the brain was performed following IV bolus contrast injection. Subsequent parametric perfusion maps were calculated using RAPID software. CONTRAST:  169m ISOVUE-370 IOPAMIDOL (ISOVUE-370) INJECTION 76% COMPARISON:  Prior CT from earlier the same day. Comparison also made with prior CTA from 11/30/2017 and arteriogram from 12/03/2017 FINDINGS: CTA NECK FINDINGS Aortic arch: Visualized aortic arch of normal caliber with normal 3 vessel morphology. No flow-limiting stenosis about the origin of the great vessels. Visualized subclavian arteries widely patent. Right carotid system: Right common carotid artery patent from its origin to the bifurcation without hemodynamically significant stenosis. Concentric mixed plaque about the right bifurcation with associated mild stenosis of up to 35% by NASCET criteria. Right ICA otherwise widely patent to the skull base without stenosis, dissection, or occlusion. Left carotid  system: Left common carotid artery patent from its origin to the bifurcation without stenosis. Mixed but predominantly soft plaque at the left bifurcation/proximal left ICA with associated stenosis of up to 60% by NASCET criteria (series 8, image 148). Left ICA widely patent distally to the skull base without additional stenosis, dissection, or occlusion. Vertebral arteries: Both of the vertebral arteries arise from the subclavian arteries. Mild narrowing at the origin of the left vertebral artery. Vertebral arteries otherwise widely patent within the neck without stenosis, dissection, or occlusion. Skeleton: No acute osseous abnormality. No discrete lytic or blastic osseous lesions. 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 Poor dentition with prominent dental carie at the left first mandibular molar. Other neck: No other acute soft tissue abnormality within the neck. Upper chest: Visualized upper chest demonstrates no acute finding. Partially visualized lungs are grossly clear. Mild upper lobe predominant paraseptal emphysema. Review of the MIP images confirms the above findings CTA HEAD FINDINGS Anterior circulation: Internal carotid arteries widely patent to the termini without hemodynamically significant stenosis. ICA termini well perfused and symmetric. A1 segments widely patent bilaterally. Normal anterior communicating artery. Anterior cerebral arteries widely patent to their distal aspects without stenosis. No M1 stenosis or occlusion. Normal MCA bifurcations. Distal MCA branches well perfused and symmetric. Posterior circulation: Vertebral arteries are co dominant and widely patent as they course into the cranial vault. Severe near occlusive stenosis at the distal left V4 segment just prior to the vertebrobasilar junction (series 7, image 151). Right PICA patent proximally. Moderate multifocal atheromatous irregularity within the proximal left V4 segment with up to moderate approximate 50%  stenosis. Vascular stent in place distally within the left V4 segment, extending distally to the proximal-mid basilar artery (series 7, image 149-proximal aspect, series 7 image 128-distal aspect). Possible stenoses at the stent margins difficult to evaluate given adjacent stent markers. There is wide patency of the stent through the distal left vertebral artery and proximal basilar artery, markedly improved relative to prior CTA. Attenuated but patent flow within the proximal-mid basilar artery distal to the takeoff of  the anterior inferior cerebellar arteries, with approximate moderate diffuse narrowing (series 8, image 123 coronal, series 7, image 133 axial). Basilar moderately narrowed just distal to the stent by approximately 50-60%, likely similar to previous CTA (series 7, image 124). Superior cerebellar arteries irregular but patent at their origins. Both of the PCAs appear to be primarily supplied via the basilar. Extensive atheromatous irregularity with short-segment moderate to severe proximal P1 stenoses (series 8, image 115 on the left, image 116 on the right). PCAs are patent distally but attenuated with extensive atheromatous irregularity. Venous sinuses: Patent. Anatomic variants: None significant. Delayed phase: No pathologic enhancement. Chronic left cerebellar infarcts noted. Review of the MIP images confirms the above findings CT Brain Perfusion Findings: CBF (<30%) Volume: 30m Perfusion (Tmax>6.0s) volume: 198mMismatch Volume: 1161mnfarction Location:Negative CT perfusion for core infarct. Patchy perfusion deficit at the mid cerebellar region, likely related to right V4 stenosis and possibly delayed flow through the stent. IMPRESSION: 1. Negative CTA for large vessel occlusion. 2. No evidence for core infarct by CT perfusion. Mild perfusion deficit within the posterior fossa likely related to bilateral V4 stenoses. 3. Vertebrobasilar stent in place extending from the left V4 segment through  the mid basilar artery. Widely patent flow through the proximal and mid aspect of the stent. Attenuated flow with up to approximate 50% narrowing through the distal aspect of the stent, distal to the anterior inferior cerebral arteries. Basilar artery is patent distally, with additional moderate to advanced atheromatous irregularity involving the SCAs and PCAs bilaterally. 4. 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 5. Mild 35% atheromatous stenosis at the origin of the right ICA. 6. Widely patent anterior circulation within the head. Critical Value/emergent results were called by telephone at the time of interpretation on 02/08/2018 at 11:00 pm to Dr. MICGerlene Feewho verbally acknowledged these results. Electronically Signed   By: BenJeannine BogaD.   On: 02/08/2018 23:33   Ct Angio Neck W And/or Wo Contrast  Result Date: 02/08/2018 CLINICAL DATA:  Initial evaluation for acute right facial numbness. EXAM: CT ANGIOGRAPHY HEAD AND NECK CT PERFUSION BRAIN TECHNIQUE: Multidetector CT imaging of the head and neck was performed using the standard protocol during bolus administration of intravenous contrast. Multiplanar CT image reconstructions and MIPs were obtained to evaluate the vascular anatomy. Carotid stenosis measurements (when applicable) are obtained utilizing NASCET criteria, using the distal internal carotid diameter as the denominator. Multiphase CT imaging of the brain was performed following IV bolus contrast injection. Subsequent parametric perfusion maps were calculated using RAPID software. CONTRAST:  125m41mOVUE-370 IOPAMIDOL (ISOVUE-370) INJECTION 76% COMPARISON:  Prior CT from earlier the same day. Comparison also made with prior CTA from 11/30/2017 and arteriogram from 12/03/2017 FINDINGS: CTA NECK FINDINGS Aortic arch: Visualized aortic arch of normal caliber with normal 3 vessel morphology. No flow-limiting stenosis about the origin of the great vessels. Visualized  subclavian arteries widely patent. Right carotid system: Right common carotid artery patent from its origin to the bifurcation without hemodynamically significant stenosis. Concentric mixed plaque about the right bifurcation with associated mild stenosis of up to 35% by NASCET criteria. Right ICA otherwise widely patent to the skull base without stenosis, dissection, or occlusion. Left carotid system: Left common carotid artery patent from its origin to the bifurcation without stenosis. Mixed but predominantly soft plaque at the left bifurcation/proximal left ICA with associated stenosis of up to 60% by NASCET criteria (series 8, image 148). Left ICA widely patent distally to  the skull base without additional stenosis, dissection, or occlusion. Vertebral arteries: Both of the vertebral arteries arise from the subclavian arteries. Mild narrowing at the origin of the left vertebral artery. Vertebral arteries otherwise widely patent within the neck without stenosis, dissection, or occlusion. Skeleton: No acute osseous abnormality. No discrete lytic or blastic osseous lesions. 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 Poor dentition with prominent dental carie at the left first mandibular molar. Other neck: No other acute soft tissue abnormality within the neck. Upper chest: Visualized upper chest demonstrates no acute finding. Partially visualized lungs are grossly clear. Mild upper lobe predominant paraseptal emphysema. Review of the MIP images confirms the above findings CTA HEAD FINDINGS Anterior circulation: Internal carotid arteries widely patent to the termini without hemodynamically significant stenosis. ICA termini well perfused and symmetric. A1 segments widely patent bilaterally. Normal anterior communicating artery. Anterior cerebral arteries widely patent to their distal aspects without stenosis. No M1 stenosis or occlusion. Normal MCA bifurcations. Distal MCA branches well perfused and  symmetric. Posterior circulation: Vertebral arteries are co dominant and widely patent as they course into the cranial vault. Severe near occlusive stenosis at the distal left V4 segment just prior to the vertebrobasilar junction (series 7, image 151). Right PICA patent proximally. Moderate multifocal atheromatous irregularity within the proximal left V4 segment with up to moderate approximate 50% stenosis. Vascular stent in place distally within the left V4 segment, extending distally to the proximal-mid basilar artery (series 7, image 149-proximal aspect, series 7 image 128-distal aspect). Possible stenoses at the stent margins difficult to evaluate given adjacent stent markers. There is wide patency of the stent through the distal left vertebral artery and proximal basilar artery, markedly improved relative to prior CTA. Attenuated but patent flow within the proximal-mid basilar artery distal to the takeoff of the anterior inferior cerebellar arteries, with approximate moderate diffuse narrowing (series 8, image 123 coronal, series 7, image 133 axial). Basilar moderately narrowed just distal to the stent by approximately 50-60%, likely similar to previous CTA (series 7, image 124). Superior cerebellar arteries irregular but patent at their origins. Both of the PCAs appear to be primarily supplied via the basilar. Extensive atheromatous irregularity with short-segment moderate to severe proximal P1 stenoses (series 8, image 115 on the left, image 116 on the right). PCAs are patent distally but attenuated with extensive atheromatous irregularity. Venous sinuses: Patent. Anatomic variants: None significant. Delayed phase: No pathologic enhancement. Chronic left cerebellar infarcts noted. Review of the MIP images confirms the above findings CT Brain Perfusion Findings: CBF (<30%) Volume: 58m Perfusion (Tmax>6.0s) volume: 137mMismatch Volume: 1118mnfarction Location:Negative CT perfusion for core infarct. Patchy  perfusion deficit at the mid cerebellar region, likely related to right V4 stenosis and possibly delayed flow through the stent. IMPRESSION: 1. Negative CTA for large vessel occlusion. 2. No evidence for core infarct by CT perfusion. Mild perfusion deficit within the posterior fossa likely related to bilateral V4 stenoses. 3. 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 Widely patent flow through the proximal and mid aspect of the stent. Attenuated flow with up to approximate 50% narrowing through the distal aspect of the stent, distal to the anterior inferior cerebral arteries. Basilar artery is patent distally, with additional moderate to advanced atheromatous irregularity involving the SCAs and PCAs bilaterally. 4. 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 5. Mild 35% atheromatous stenosis at the origin of the right ICA. 6. Widely patent anterior circulation  within the head. Critical Value/emergent results were called by telephone at the time of interpretation on 02/08/2018 at 11:00 pm to Dr. Gerlene Fee , who verbally acknowledged these results. Electronically Signed   By: Jeannine Boga M.D.   On: 02/08/2018 23:33   Ct Cerebral Perfusion W Contrast  Result Date: 02/08/2018 CLINICAL DATA:  Initial evaluation for acute right facial numbness. EXAM: CT ANGIOGRAPHY HEAD AND NECK CT PERFUSION BRAIN TECHNIQUE: Multidetector CT imaging of the head and neck was performed using the standard protocol during bolus administration of intravenous contrast. Multiplanar CT image reconstructions and MIPs were obtained to evaluate the vascular anatomy. Carotid stenosis measurements (when applicable) are obtained utilizing NASCET criteria, using the distal internal carotid diameter as the denominator. Multiphase CT imaging of the brain was performed following IV bolus contrast injection. Subsequent parametric perfusion maps were calculated using RAPID  software. CONTRAST:  121m ISOVUE-370 IOPAMIDOL (ISOVUE-370) INJECTION 76% COMPARISON:  Prior CT from earlier the same day. Comparison also made with prior CTA from 11/30/2017 and arteriogram from 12/03/2017 FINDINGS: CTA NECK FINDINGS Aortic arch: Visualized aortic arch of normal caliber with normal 3 vessel morphology. No flow-limiting stenosis about the origin of the great vessels. Visualized subclavian arteries widely patent. Right carotid system: Right common carotid artery patent from its origin to the bifurcation without hemodynamically significant stenosis. Concentric mixed plaque about the right bifurcation with associated mild stenosis of up to 35% by NASCET criteria. Right ICA otherwise widely patent to the skull base without stenosis, dissection, or occlusion. Left carotid system: Left common carotid artery patent from its origin to the bifurcation without stenosis. Mixed but predominantly soft plaque at the left bifurcation/proximal left ICA with associated stenosis of up to 60% by NASCET criteria (series 8, image 148). Left ICA widely patent distally to the skull base without additional stenosis, dissection, or occlusion. Vertebral arteries: Both of the vertebral arteries arise from the subclavian arteries. Mild narrowing at the origin of the left vertebral artery. Vertebral arteries otherwise widely patent within the neck without stenosis, dissection, or occlusion. Skeleton: No acute osseous abnormality. No discrete lytic or blastic osseous lesions. 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 Poor dentition with prominent dental carie at the left first mandibular molar. Other neck: No other acute soft tissue abnormality within the neck. Upper chest: Visualized upper chest demonstrates no acute finding. Partially visualized lungs are grossly clear. Mild upper lobe predominant paraseptal emphysema. Review of the MIP images confirms the above findings CTA HEAD FINDINGS Anterior circulation:  Internal carotid arteries widely patent to the termini without hemodynamically significant stenosis. ICA termini well perfused and symmetric. A1 segments widely patent bilaterally. Normal anterior communicating artery. Anterior cerebral arteries widely patent to their distal aspects without stenosis. No M1 stenosis or occlusion. Normal MCA bifurcations. Distal MCA branches well perfused and symmetric. Posterior circulation: Vertebral arteries are co dominant and widely patent as they course into the cranial vault. Severe near occlusive stenosis at the distal left V4 segment just prior to the vertebrobasilar junction (series 7, image 151). Right PICA patent proximally. Moderate multifocal atheromatous irregularity within the proximal left V4 segment with up to moderate approximate 50% stenosis. Vascular stent in place distally within the left V4 segment, extending distally to the proximal-mid basilar artery (series 7, image 149-proximal aspect, series 7 image 128-distal aspect). Possible stenoses at the stent margins difficult to evaluate given adjacent stent markers. There is wide patency of the stent through the distal left vertebral artery and proximal basilar artery, markedly improved  relative to prior CTA. Attenuated but patent flow within the proximal-mid basilar artery distal to the takeoff of the anterior inferior cerebellar arteries, with approximate moderate diffuse narrowing (series 8, image 123 coronal, series 7, image 133 axial). Basilar moderately narrowed just distal to the stent by approximately 50-60%, likely similar to previous CTA (series 7, image 124). Superior cerebellar arteries irregular but patent at their origins. Both of the PCAs appear to be primarily supplied via the basilar. Extensive atheromatous irregularity with short-segment moderate to severe proximal P1 stenoses (series 8, image 115 on the left, image 116 on the right). PCAs are patent distally but attenuated with extensive  atheromatous irregularity. Venous sinuses: Patent. Anatomic variants: None significant. Delayed phase: No pathologic enhancement. Chronic left cerebellar infarcts noted. Review of the MIP images confirms the above findings CT Brain Perfusion Findings: CBF (<30%) Volume: 68m Perfusion (Tmax>6.0s) volume: 140mMismatch Volume: 1158mnfarction Location:Negative CT perfusion for core infarct. Patchy perfusion deficit at the mid cerebellar region, likely related to right V4 stenosis and possibly delayed flow through the stent. IMPRESSION: 1. Negative CTA for large vessel occlusion. 2. No evidence for core infarct by CT perfusion. Mild perfusion deficit within the posterior fossa likely related to bilateral V4 stenoses. 3. 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 Widely patent flow through the proximal and mid aspect of the stent. Attenuated flow with up to approximate 50% narrowing through the distal aspect of the stent, distal to the anterior inferior cerebral arteries. Basilar artery is patent distally, with additional moderate to advanced atheromatous irregularity involving the SCAs and PCAs bilaterally. 4. 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 5. Mild 35% atheromatous stenosis at the origin of the right ICA. 6. Widely patent anterior circulation within the head. Critical Value/emergent results were called by telephone at the time of interpretation on 02/08/2018 at 11:00 pm to Dr. MICGerlene Feewho verbally acknowledged these results. Electronically Signed   By: BenJeannine BogaD.   On: 02/08/2018 23:33   Ct Head Code Stroke Wo Contrast  Result Date: 02/08/2018 CLINICAL DATA:  Code stroke.  Right-sided facial numbness EXAM: CT HEAD WITHOUT CONTRAST TECHNIQUE: Contiguous axial images were obtained from the base of the skull through the vertex without intravenous contrast. COMPARISON:  None. FINDINGS: Brain: There is no mass,  hemorrhage or extra-axial collection. The size and configuration of the ventricles and extra-axial CSF spaces are normal. There is hypoattenuation of the periventricular white matter, most commonly indicating chronic ischemic microangiopathy. Vascular: No abnormal hyperdensity of the major intracranial arteries or dural venous sinuses. No intracranial atherosclerosis. Skull: Right parietal scalp hematoma.  No skull fracture. Sinuses/Orbits: No fluid levels or advanced mucosal thickening of the visualized paranasal sinuses. No mastoid or middle ear effusion. The orbits are normal. ASPECTS ([facility] Program Early CT Score) - Ganglionic level infarction (caudate, lentiform nuclei, internal capsule, insula, M1-M3 cortex): 7 - Supraganglionic infarction (M4-M6 cortex): 3 Total score (0-10 with 10 being normal): 10 IMPRESSION: 1. No acute intracranial abnormality. 2. ASPECTS is 10. 3. Right parietal scalp hematoma without skull fracture. * These results were called by telephone at the time of interpretation on 02/08/2018 at 8:58 pm to Dr. MICGerlene Feewho verbally acknowledged these results. Electronically Signed   By: KevUlyses JarredD.   On: 02/08/2018 20:59    EKG: Independently reviewed. Sinus rhythm.   Assessment/Plan   1. Acute right facial numbness; history of CVA  -  Patient has hx of multiple posterior circulation strokes, had basilar artery stented in September, and no presents with acute-onset of right facial numbness   - No acute findings on CT head and no large vessel occlusion on CTA head & neck  - Neurology is consulting and much appreciated  - Continue cardiac monitoring, with frequent neuro checks and PT/OT/SLP evals  - Check MRI brain, echocardiogram, A1c, and fasting lipids  - Continue ASA 81 and Brilinta    2. CAD  - No anginal complaints  - Continue antiplatelets, hold ARB while evaluating for possible acute ischemic CVA, not on beta-blocker d/t bradycardia, had rhabdo on  statin, and had headaches on Imdur   3. Chronic combined systolic & diastolic CHF  - Appears compensated  - Follows with cardiology, had been taken off of Entresto d/t hypotension and not on beta-blocker d/t bradycardia  - Hold ARB while evaluating for possible acute ischemic CVA, monitor daily weights    4. Type II DM  - A1c was 7.9% in October  - Managed at home with glimepiride and Janumet, held on admission  - Check CBG's and use a SSI with Novolog while in the hospital    5. Hypertension  - BP slightly elevated in ED  - Losartan held on admission while evaluating for possible acute ischemic CVA   6. Hypothyroidism  - Continue Synthroid    DVT prophylaxis: Lovenox  Code Status: Full  Family Communication: Discussed with patient  Consults called: Neurology Admission status: observation     Vianne Bulls, MD [facility] Pager [phone]  If 7PM-7AM, please contact night-coverage [email or web address] Password [facility]  02/08/2018, 11:53 PM

## 2018-02-08 NOTE — ED Provider Notes (Signed)
[facility] Emergency Department Provider Note MRN:  [id number]  Arrival date & time: 02/08/18     Chief Complaint   Code Stroke   History of Present Illness   Devin [name] is a 47 y.o. year-old male with a history of CAD, multiple strokes presenting to the ED with chief complaint of facial numbness.  At 815, 30 minutes prior to arrival, patient experienced sudden onset numbness to the right side of his face.  Has had multiple strokes in the past with mild residual left-sided weakness.  Patient denies any headache or vision change, no chest pain or shortness of breath, no abdominal pain, no trauma.  Denies any new significant weakness.  Review of Systems  A complete 10 system review of systems was obtained and all systems are negative except as noted in the HPI and PMH.   Patient's Health History    Past Medical History:  Diagnosis Date  . Asthma   . Chest pain   . Coronary artery disease    a. s/p DES x2 to LAD and DES to OM in 11/2017  . Dyspnea   . GERD (gastroesophageal reflux disease)   . History of kidney stones   . History of rhabdomyolysis   . Hyperlipidemia   . Hypertension   . Hypothyroidism   . Ischemic cardiomyopathy    a. 11/2017: echo showing EF of 35-40%, diffuse HK, and Grade 2 DD  . Kidney calculus 2014  . Pericardial effusion    Small, by dobutamine echocardiogram, 04/2006  . Stroke (HCC)   . Tobacco abuse   . Type 2 diabetes mellitus without complications (HCC) 10/07/2013  . Vitamin D deficiency     Past Surgical History:  Procedure Laterality Date  . 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 CHOLECYSTECTOMY N/A 01/27/2013   Procedure: LAPAROSCOPIC CHOLECYSTECTOMY;  Surgeon: Dalia Heading, MD;  Location: [facility] ORS;  Service: General;  Laterality: N/A;  . CORONARY STENT INTERVENTION N/A 11/15/2017   Procedure: CORONARY STENT INTERVENTION;  Surgeon:  Marykay Lex, MD;  Location: [facility] INVASIVE CV LAB;  Service: Cardiovascular;  Laterality: N/A;  . HERNIA REPAIR     As a child  . INSERTION OF MESH N/A 11/13/2012   Procedure: INSERTION OF MESH;  Surgeon: Dalia Heading, MD;  Location: [facility] ORS;  Service: General;  Laterality: N/A;  . IR ANGIO INTRA EXTRACRAN SEL COM CAROTID INNOMINATE BILAT MOD SED  12/01/2017  . IR ANGIO VERTEBRAL SEL VERTEBRAL BILAT MOD SED  12/01/2017  . IR INTRA CRAN STENT  12/03/2017  . KIDNEY STONE SURGERY    . LEFT HEART CATH AND CORONARY ANGIOGRAPHY N/A 11/15/2017   Procedure: LEFT HEART CATH AND CORONARY ANGIOGRAPHY;  Surgeon: Marykay Lex, MD;  Location: [facility] INVASIVE CV LAB;  Service: Cardiovascular;  Laterality: N/A;  . PERCUTANEOUS NEPHROLITHOTRIPSY    . RADIOLOGY WITH ANESTHESIA Left 12/03/2017   Procedure: Angioplasty with possible stenting of left VBJ;  Surgeon: Julieanne Cotton, MD;  Location: [facility] OR;  Service: Radiology;  Laterality: Left;  . UMBILICAL HERNIA REPAIR N/A 11/13/2012   Procedure: UMBILICAL HERNIORRHAPHY;  Surgeon: Dalia Heading, MD;  Location: [facility] ORS;  Service: General;  Laterality: N/A;  . VARICOCELECTOMY      Family History  Problem Relation Age of Onset  . Coronary artery disease Father   . Hypertension Father   . Hyperlipidemia Father   . Diabetes  Father   . Congestive Heart Failure Father [id number]  . Arrhythmia Father        had an ICD  . Diabetes Mother   . Hypertension Mother   . Hyperlipidemia Mother   . Obesity Mother [id number]       died after bariatric surgery, liver failure and infection  . Coronary artery disease Maternal Grandfather        both grandfathers and several uncles  . Coronary artery disease Unknown   . Diabetes Sister        both sisters  . Stroke Maternal Aunt   . Stroke Maternal Uncle     Social History   Socioeconomic History  . Marital status: Divorced    Spouse name: Not on file  . Number of children: Not on file  . Years of education: Not on file  . Highest  education level: Not on file  Occupational History    Comment:      Employer: [employer]  Social Needs  . Financial resource strain: Not on file  . Food insecurity:    Worry: Not on file    Inability: Not on file  . Transportation needs:    Medical: Not on file    Non-medical: Not on file  Tobacco Use  . Smoking status: Current Some Day Smoker    Packs/day: 0.50    Years: 26.00    Pack years: 13.00    Types: Cigarettes    Start date: 03/13/1985  . Smokeless tobacco: Former NeurosurgeonUser    Types: Snuff, Chew    Quit date: 03/14/1987  . Tobacco comment: 8 PER DAY  Substance and Sexual Activity  . Alcohol use: No  . Drug use: No  . Sexual activity: Not on file  Lifestyle  . Physical activity:    Days per week: Not on file    Minutes per session: Not on file  . Stress: Not on file  Relationships  . Social connections:    Talks on phone: Not on file    Gets together: Not on file    Attends religious service: Not on file    Active member of club or organization: Not on file    Attends meetings of clubs or organizations: Not on file    Relationship status: Not on file  . Intimate partner violence:    Fear of current or ex partner: Not on file    Emotionally abused: Not on file    Physically abused: Not on file    Forced sexual activity: Not on file  Other Topics Concern  . Not on file  Social History Narrative   Eats fast food daily   No exercise outside of work   Lives in [place] with his dog   MAINTENANCE TECHNICIAN     Physical Exam  Vital Signs and Nursing Notes reviewed Vitals:   02/08/18 2136 02/08/18 2240  BP: (!) 140/93 136/84  Pulse: 69 74  Resp: 14 18  Temp:    SpO2: 100% 98%    CONSTITUTIONAL: Well-appearing, NAD NEURO:  Alert and oriented x 3, loss of sensation to the right side of the face, no appreciable facial droop, subtle right arm pronator drift, no aphasia, no neglect EYES:  eyes equal and reactive, no nystagmus, normal extraocular movements, no field  cuts ENT/NECK:  no LAD, no JVD CARDIO: Regular rate, well-perfused, normal S1 and S2 PULM:  CTAB no wheezing or rhonchi GI/GU:  normal bowel sounds, non-distended, non-tender MSK/SPINE:  No gross deformities,  no edema SKIN:  no rash, atraumatic PSYCH:  Appropriate speech and behavior  Diagnostic and Interventional Summary    EKG Interpretation  Date/Time:  Friday February 08 2018 20:59:05 EST Ventricular Rate:  79 PR Interval:    QRS Duration: 106 QT Interval:  368 QTC Calculation: 422 R Axis:   96 Text Interpretation:  Sinus rhythm Borderline right axis deviation Confirmed by Kennis Carina [phone]) on 02/08/2018 10:54:40 PM      Labs Reviewed  DIFFERENTIAL - Abnormal; Notable for the following components:      Result Value   Lymphs Abs 4.2 (*)    All other components within normal limits  COMPREHENSIVE METABOLIC PANEL - Abnormal; Notable for the following components:   Glucose, Bld 114 (*)    Total Protein 8.6 (*)    All other components within normal limits  I-STAT CHEM 8, ED - Abnormal; Notable for the following components:   Creatinine, Ser 1.30 (*)    Glucose, Bld 109 (*)    All other components within normal limits  CBG MONITORING, ED - Abnormal; Notable for the following components:   Glucose-Capillary 116 (*)    All other components within normal limits  ETHANOL  PROTIME-INR  APTT  CBC  RAPID URINE DRUG SCREEN, HOSP PERFORMED  URINALYSIS, ROUTINE W REFLEX MICROSCOPIC  I-STAT TROPONIN, ED    CT HEAD CODE STROKE WO CONTRAST  Final Result    CT Angio Head W or Wo Contrast    (Results Pending)  CT Angio Neck W and/or Wo Contrast    (Results Pending)  CT CEREBRAL PERFUSION W CONTRAST    (Results Pending)    Medications  iopamidol (ISOVUE-370) 76 % injection 125 mL (125 mLs Intravenous Contrast Given 02/08/18 2215)     Procedures Critical Care Critical Care Documentation Critical care time provided by me (excluding procedures): 39 minutes  Condition  necessitating critical care: Concern for acute ischemic stroke  Components of critical care management: reviewing of prior records, laboratory and imaging interpretation, frequent re-examination and reassessment of vital signs, initiation of code sepsis protocol, discussion with telemetry neurologist as well as [facility] eurologist, facilitation of transfer    ED Course and Medical Decision Making  I have reviewed the triage vital signs and the nursing notes.  Pertinent labs & imaging results that were available during my care of the patient were reviewed by me and considered in my medical decision making (see below for details).  Concern for acute ischemic stroke in this 47 year old male with history of the same, code stroke initiated, telemetry neurologist at bedside shortly, CT head pending.  Clinical Course as of Feb 09 2304  Caleen Essex Feb 08, 2018  2140 Not a TPA candidate given low stroke scale per telemetry neurologist.  Recommended transfer to facility with neuro interventional capabilities.   [MB]  2140 Discussed case with Dr. Jerrell Belfast of neurology, very low concern for involvement of patient's basilar artery stent given mild nature of symptoms.  Will obtain CTA imaging here, await results, and admit to hospitalist service if unremarkable.   [MB]    Clinical Course User Index [MB] Sabas Sous, MD    CTA with no acute findings, significant vertebrobasilar chronic disease.  Basilar artery stent is patent.  Hospitalist service called for admission and transfer to [facility] for further care.  Elmer Sow. Pilar Plate, MD [facility] Emergency Medicine [facility] [email or web address]  Final Clinical Impressions(s) / ED Diagnoses     ICD-10-CM   1. Acute  ischemic stroke (HCC) I63.9   2. Numbness and tingling of right face R20.0    R20.2     ED Discharge Orders    None         Sabas Sous, MD 02/08/18 2307

## 2018-02-08 NOTE — Consult Note (Signed)
[facility] Tele[facility]urology Consult Services   Date of Service:   02/08/2018 20:56:55  Impression:     .  Posterior Circulation Infarct  Comments: Has chronic left leg weakness from prior cva otherwise NIH is a 1 for new right-sided numbness. The patient has an extremely complex history with a recent basilar artery stent placed in September of this year. For this reason with multiple strokes in the posterior circulation and unusual clinical scenario recommend the patient be transferred given that he appears to be having a new ischemic stroke to a facility that has the capacity to evaluate the stent adequately and to intervene if necessary. He is not a TPA candidate due to a stroke within the past 3 months  Metrics: Last Known Well: 02/08/2018 20:15:00 [facility] Notification Time: 02/08/2018 20:56:18 Arrival Time: 02/08/2018 20:35:00 Stamp Time: 02/08/2018 20:56:55 Time First Login Attempt: 02/08/2018 21:00:47 Video Start Time: 02/08/2018 21:00:47  Symptoms: right sided numbness and HA NIHSS Start Assessment Time: 02/08/2018 21:04:50 Patient is not a candidate for tPA. Patient was not deemed candidate for tPA thrombolytics because of the patient had a stroke within the past 3 months just recently in late October not a TPA candidate. Video End Time: 02/08/2018 21:16:11  CT head showed no acute hemorrhage or acute core infarct.  Advanced imaging was not obtained as the presentation was not suggestive of Large Vessel Occlusive Disease.   Not discussed with neurointerventionalist because it is not clear that the patient is a neuro-interventional candidate but he had a basilar artery stent placed in September and would be best served at a facility that has neuro-interventional available to ensure stent does not need any additional intervention or management ER Physician notified of the decision on thrombolytics management on 02/08/2018 21:16:14  Our recommendations are outlined  below.  Recommendations:     .  Activate Stroke Protocol Admission/Order Set     .  Stroke/Telemetry Floor     .  Neuro Checks     .  Bedside Swallow Eval     .  DVT Prophylaxis     .  IV Fluids, Normal Saline     .  Head of Bed Below 30 Degrees     .  Euglycemia and Avoid Hyperthermia (PRN Acetaminophen)     .  patient can continue aspirin and brillenta for now  Routine Consultation with Inhouse Neurology for Follow up Care  Sign Out:     .  Discussed with Emergency Department Provider    ------------------------------------------------------------------------------  History of Present Illness: Patient is a 47 year old Male.  Patient was brought by private transportation with symptoms of right sided numbness and HA  THree prior cva last one in OCtober of this year on 01/05/2018. of this year. Takes asa daily. He says in September was different with HA with dizziness no numbness. MRI with mx strokes in posterior circulation. the patient is extremely complicated has a history of coronary artery disease status post stenting, ischemic cardiomyopathy, diabetes, hypertension, hyperlipidemia, also had a basilar artery stent placed in September. Takes asa and Brillenta  CT head showed no acute hemorrhage or acute core infarct.   Examination: BP(156/93), Blood Glucose(116) 1A: Level of Consciousness - Alert; keenly responsive + 0 1B: Ask Month and Age - Both Questions Right + 0 1C: Blink Eyes & Squeeze Hands - Performs Both Tasks + 0 2: Test Horizontal Extraocular Movements - Normal + 0 3: Test Visual Fields - No Visual Loss + 0 4: Test Facial Palsy (  Use Grimace if Obtunded) - Normal symmetry + 0 5A: Test Left Arm Motor Drift - No Drift for 10 Seconds + 0 5B: Test Right Arm Motor Drift - No Drift for 10 Seconds + 0 6A: Test Left Leg Motor Drift - Drift, but doesn't hit bed + 1 6B: Test Right Leg Motor Drift - No Drift for 5 Seconds + 0 7: Test Limb Ataxia (FNF/Heel-Shin) - No  Ataxia + 0 8: Test Sensation - Mild-Moderate Loss: Less Sharp/More Dull + 1 9: Test Language/Aphasia - Normal; No aphasia + 0 10: Test Dysarthria - Normal + 0 11: Test Extinction/Inattention - No abnormality + 0  NIHSS Score: 2  Patient was informed the Neurology Consult would happen via TeleHealth consult by way of interactive audio and video telecommunications and consented to receiving care in this manner.  Due to the immediate potential for life-threatening deterioration due to underlying acute neurologic illness, I spent 35 minutes providing critical care. This time includes time for face to face visit via telemedicine, review of medical records, imaging studies and discussion of findings with providers, the patient and/or family.   Dr Jossie Ng   [facility] [phone]   Case [id number]

## 2018-02-08 NOTE — ED Triage Notes (Signed)
Pt reports approx 30 minutes ago the right side of his face went numb into his right ear.  Pt also reports headache.

## 2018-02-08 NOTE — Progress Notes (Signed)
CODE STROKE PROTOCOL  [facility] [phone]  Call time 8:48 Beeper8:58 Exam started 8:51 Exam finished,[facility] called: 8:54  RIGHT SIDE FACIAL DROOP STARTED TODAY PRIOR TO ARRIVAL

## 2018-02-09 ENCOUNTER — Other Ambulatory Visit: Payer: Self-pay

## 2018-02-09 ENCOUNTER — Observation Stay (HOSPITAL_BASED_OUTPATIENT_CLINIC_OR_DEPARTMENT_OTHER): Payer: Self-pay

## 2018-02-09 ENCOUNTER — Observation Stay (HOSPITAL_COMMUNITY): Payer: Self-pay

## 2018-02-09 DIAGNOSIS — R29818 Other symptoms and signs involving the nervous system: Secondary | ICD-10-CM

## 2018-02-09 DIAGNOSIS — I340 Nonrheumatic mitral (valve) insufficiency: Secondary | ICD-10-CM

## 2018-02-09 LAB — GLUCOSE, CAPILLARY
Glucose-Capillary: 135 mg/dL — ABNORMAL HIGH (ref 70–99)
Glucose-Capillary: 126 mg/dL — ABNORMAL HIGH (ref 70–99)
Glucose-Capillary: 117 mg/dL — ABNORMAL HIGH (ref 70–99)
Glucose-Capillary: 240 mg/dL — ABNORMAL HIGH (ref 70–99)

## 2018-02-09 LAB — ECHOCARDIOGRAM COMPLETE
Height: 78 in
Weight: 4313.96 oz

## 2018-02-09 NOTE — Evaluation (Signed)
Physical Therapy Evaluation Patient Details Name: Devin DailyCarl L Fujimoto MRN: [id number] DOB: 01/30/1971 Today's Date: 02/09/2018   History of Present Illness  Pt is a 47 y.o. 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  MRI positive: Punctate 5 mm focus of diffusion abnormality involving the right paramedian pons, suspicious for a small acute to early subacute  Clinical Impression  Patient seen for therapy assessment.  Mobilizing well with no noted focal deficits at this time. Educated patient on BEFAST signs. No further acute PT needs. Will sign off.     Follow Up Recommendations No PT follow up    Equipment Recommendations  None recommended by PT    Recommendations for Other Services       Precautions / Restrictions Restrictions Weight Bearing Restrictions: No      Mobility  Bed Mobility Overal bed mobility: Modified Independent             General bed mobility comments: increased time, but independent  Transfers Overall transfer level: Modified independent Equipment used: None             General transfer comment: no assist required, good safety awareness  Ambulation/Gait Ambulation/Gait assistance: Modified independent (Device/Increase time) Gait Distance (Feet): 310 Feet Assistive device: Straight cane Gait Pattern/deviations: WFL(Within Functional Limits) Gait velocity: decreased   General Gait Details: modest instability with decreased stride LLE but overall functional with no overt LOB  Stairs Stairs: Yes Stairs assistance: Supervision Stair Management: One rail Right Number of Stairs: 5 General stair comments: no difficulty  Wheelchair  Mobility    Modified Rankin (Stroke Patients Only) Modified Rankin (Stroke Patients Only) Pre-Morbid Rankin Score: No significant disability Modified Rankin: No significant disability     Balance Overall balance assessment: Mild deficits observed, not formally tested                           High level balance activites: Direction changes;Turns;Sudden stops;Head turns High Level Balance Comments: no physical assist of noted LOB             Pertinent Vitals/Pain Pain Assessment: 0-10 Pain Score: 2  Pain Location: headache Pain Descriptors / Indicators: Discomfort;Headache Pain Intervention(s): Monitored during session    Home Living Family/patient expects to be discharged to:: Private residence Living Arrangements: Alone Available Help at Discharge: Family;Friend(s);Available PRN/intermittently Type of Home: Mobile home Home Access: Stairs to enter Entrance Stairs-Rails: Right Entrance Stairs-Number of Steps: 5 Home Layout: One level Home Equipment: Walker - 2 wheels;Cane - single point;Crutches;Bedside commode      Prior Function Level of Independence: Independent with assistive device(s)         Comments: uses cane for mobility, +driving, - workingg      Hand Dominance   Dominant Hand: Left    Extremity/Trunk Assessment   Upper Extremity Assessment Upper Extremity Assessment: Overall WFL for tasks assessed(reports L sided weakness at baseline, WFL )    Lower Extremity Assessment Lower Extremity Assessment: LLE deficits/detail LLE Deficits / Details: residual left sided weakness from previous CVA LLE Coordination: decreased fine motor    Cervical / Trunk Assessment Cervical / Trunk Assessment: Normal  Communication   Communication: No difficulties  Cognition Arousal/Alertness: Awake/alert Behavior During Therapy: WFL for tasks assessed/performed  Overall Cognitive Status: Within Functional Limits for tasks assessed                                         General Comments      Exercises     Assessment/Plan    PT Assessment Patent does not need any further PT services  PT Problem List         PT Treatment Interventions      PT Goals (Current goals can be found in the Care Plan section)  Acute Rehab PT Goals Patient Stated Goal: to go home PT Goal Formulation: All assessment and education complete, DC therapy    Frequency     Barriers to discharge        Co-evaluation               AM-PAC PT "6 Clicks" Mobility  Outcome Measure Help needed turning from your back to your side while in a flat bed without using bedrails?: None Help needed moving from lying on your back to sitting on the side of a flat bed without using bedrails?: None Help needed moving to and from a bed to a chair (including a wheelchair)?: None Help needed standing up from a chair using your arms (e.g., wheelchair or bedside chair)?: None Help needed to walk in hospital room?: None Help needed climbing 3-5 steps with a railing? : A Little 6 Click Score: 23    End of Session Equipment Utilized During Treatment: Gait belt Activity Tolerance: Patient tolerated treatment well Patient left: in bed;with call bell/phone within reach Nurse Communication: Mobility status PT Visit Diagnosis: Other symptoms and signs involving the nervous system (R29.898)    Time: 4098-1191 PT Time Calculation (min) (ACUTE ONLY): 14 min   Charges:   PT Evaluation $PT Eval Low Complexity: 1 Low          Charlotte Crumb, PT DPT  Board Certified Neurologic Specialist Acute Rehabilitation Services Pager [phone] Office [phone]   Fabio Asa 02/09/2018, 10:41 AM

## 2018-02-09 NOTE — Consult Note (Addendum)
NEURO HOSPITALIST  CONSULT  Requesting Physician: Dr. Sherryll Burger    Chief Complaint: Right facial numbness, HA  History obtained from:  Patient    HPI:                                                                                                                                         Devin [name] is an 47 y.o. 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   Per patient: 11/29 patient presented to [facility] for sudden onset of right facial numbness. Patient seen by teleneurology and was not a candidate for TPA d/t recent stroke ( in past 3 months). Denies any vision changes, hearing changes, CP, SOB, or any weakness or numbness in extremities. Endorses smoking 5-7 cigarettes per day which is decreased from 2 packs per day. Denies missing any doses of medication. Patient on  ASA and brilinta.   Hospital course:  02/08/18: CT head:no hemorrhage, CTA: no LVO 11/30: punctate 5 mm right paramedian pons, small chronic left cerebellar infarct  Chart review of past stroke:  10/28: Multiple small posterior circulation embolic infarcts in L pons and L occipital lobe 11/2017: left cerebellar infarct   Date last known well: Date: 02/08/2018 Time last known well: Time: 15:00 tPA Given: No: outside of window  Modified Rankin: Rankin Score=0 NIHSS:0   Past Medical History:  Diagnosis Date  . Asthma   . Chest pain   . Coronary artery disease    a. s/p DES x2 to LAD and DES to OM in 11/2017  . Dyspnea   . GERD (gastroesophageal reflux disease)   . History of kidney stones   . History of rhabdomyolysis   . Hyperlipidemia   . Hypertension   . Hypothyroidism   . Ischemic cardiomyopathy    a. 11/2017: echo showing EF of 35-40%, diffuse HK, and Grade 2 DD  . Kidney calculus 2014  . Pericardial effusion    Small,  by dobutamine echocardiogram, 04/2006  . Stroke (HCC)   . Tobacco abuse   . Type 2 diabetes mellitus without complications (HCC) 10/07/2013  . Vitamin D deficiency     Past Surgical History:  Procedure Laterality Date  . 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 CHOLECYSTECTOMY N/A 01/27/2013   Procedure: LAPAROSCOPIC CHOLECYSTECTOMY;  Surgeon: Dalia Heading, MD;  Location: [facility];  Service: General;  Laterality: N/A;  . CORONARY STENT INTERVENTION N/A 11/15/2017  Procedure: CORONARY STENT INTERVENTION;  Surgeon: Marykay LexHarding, David W, MD;  [facility];  Service: Cardiovascular;  Laterality: N/A;  . HERNIA REPAIR     As a child  . INSERTION OF MESH N/A 11/13/2012   Procedure: INSERTION OF MESH;  Surgeon: Dalia HeadingMark A Jenkins, MD;  Location: [facility];  Service: General;  Laterality: N/A;  . IR ANGIO INTRA EXTRACRAN SEL COM CAROTID INNOMINATE BILAT MOD SED  12/01/2017  . IR ANGIO VERTEBRAL SEL VERTEBRAL BILAT MOD SED  12/01/2017  . IR INTRA CRAN STENT  12/03/2017  . KIDNEY STONE SURGERY    . LEFT HEART CATH AND CORONARY ANGIOGRAPHY N/A 11/15/2017   Procedure: LEFT HEART CATH AND CORONARY ANGIOGRAPHY;  Surgeon: Marykay LexHarding, David W, MD;  [facility];  Service: Cardiovascular;  Laterality: N/A;  . PERCUTANEOUS NEPHROLITHOTRIPSY    . RADIOLOGY WITH ANESTHESIA Left 12/03/2017   Procedure: Angioplasty with possible stenting of left VBJ;  Surgeon: Julieanne Cottoneveshwar, Sanjeev, MD;  Location: [facility];  Service: Radiology;  Laterality: Left;  . UMBILICAL HERNIA REPAIR N/A 11/13/2012   Procedure: UMBILICAL HERNIORRHAPHY;  Surgeon: Dalia HeadingMark A Jenkins, MD;  Location: [facility];  Service: General;  Laterality: N/A;  . VARICOCELECTOMY      Family History  Problem Relation Age of Onset  . Coronary artery disease Father   . Hypertension Father   . Hyperlipidemia Father   . Diabetes Father   . Congestive Heart Failure Father  [id number]  . Arrhythmia Father        had an ICD  . Diabetes Mother   . Hypertension Mother   . Hyperlipidemia Mother   . Obesity Mother [id number]       died after bariatric surgery, liver failure and infection  . Coronary artery disease Maternal Grandfather        both grandfathers and several uncles  . Coronary artery disease Unknown   . Diabetes Sister        both sisters  . Stroke Maternal Aunt   . Stroke Maternal Uncle      Social History:  reports that he has been smoking cigarettes. He started smoking about 32 years ago. He has a 13.00 pack-year smoking history. He quit smokeless tobacco use about 30 years ago.  His smokeless tobacco use included snuff and chew. He reports that he does not drink alcohol or use drugs.  Allergies:  Allergies  Allergen Reactions  . Bee Venom Swelling  . Crestor [Rosuvastatin] Other (See Comments)    rhabdomyolosis    Medications:                                                                                                                           Scheduled: . aspirin EC  81 mg Oral Daily  . citalopram  10 mg Oral Daily  . enoxaparin (LOVENOX) injection  40 mg Subcutaneous Q24H  . ezetimibe  10 mg Oral Daily  . gabapentin  300 mg Oral  BID  . insulin aspart  0-5 Units Subcutaneous QHS  . insulin aspart  0-9 Units Subcutaneous TID WC  . levothyroxine  175 mcg Oral QAC breakfast  . pantoprazole  40 mg Oral q morning - 10a  . sodium chloride flush  3 mL Intravenous Q12H  . ticagrelor  90 mg Oral BID   Continuous: . sodium chloride     ZOX:WRUEAV chloride, acetaminophen **OR** acetaminophen (TYLENOL) oral liquid 160 mg/5 mL **OR** acetaminophen, HYDROcodone-acetaminophen, ondansetron (ZOFRAN) IV, senna-docusate, sodium chloride flush   ROS:                                                                                                                                       ROS was performed and is negative except as noted in HPI    General  Examination:                                                                                                      Blood pressure 124/81, pulse 75, temperature 98.4 F (36.9 C), temperature source Oral, resp. rate 18, height 6\' 6"  (1.981 m), weight 122.3 kg, SpO2 96 %.  HEENT-  Normocephalic, no lesions, without obvious abnormality.  Normal external eye and conjunctiva.  Cardiovascular- S1-S2 audible, pulses palpable throughout   Lungs-no rhonchi or wheezing noted, no excessive working breathing.  Saturations within normal limits on RA Abdomen- All 4 quadrants palpated and nontender Extremities- Warm, dry and intact Musculoskeletal-no joint tenderness, deformity or swelling Skin-warm and dry, no hyperpigmentation, vitiligo, or suspicious lesions  Neurological Examination Mental Status: Alert, oriented, thought content appropriate.  Speech fluent without evidence of aphasia.  Able to follow 3 step commands without difficulty. Cranial Nerves: II: Discs flat bilaterally; Visual fields grossly normal,  III,IV, VI: ptosis not present, extra-ocular motions intact bilaterally, pupils equal, round, reactive to light and accommodation V,VII: smile symmetric, facial light touch sensation normal bilaterally VIII: hearing normal bilaterally IX,X: uvula rises symmetrically XI: bilateral shoulder shrug XII: midline tongue extension Motor: Right : Upper extremity   5/5  Left:     Upper extremity   5/5  Lower extremity   5/5   Lower extremity   5/5 Tone and bulk:normal tone throughout; no atrophy noted Sensory:  light touch intact throughout, bilaterally Deep Tendon Reflexes: 2+ and symmetric biceps and patella Plantars: Right: downgoing   Left: downgoing Cerebellar: normal finger-to-nose, normal rapid alternating movements and normal heel-to-shin test Gait: deferred   Lab Results: Basic Metabolic Panel:  Recent Labs  Lab 02/08/18 2104 02/08/18 2107  NA 139 140  K 3.8 3.9  CL 104 105  CO2  24  --   GLUCOSE 114* 109*  BUN 18 20  CREATININE 1.21 1.30*  CALCIUM 9.6  --     CBC: Recent Labs  Lab 02/08/18 2104 02/08/18 2107  WBC 9.9  --   NEUTROABS 4.6  --   HGB 13.6 14.3  HCT 42.9 42.0  MCV 89.6  --   PLT 352  --     CBG: Recent Labs  Lab 02/08/18 2057 02/09/18 0221  GLUCAP 116* 135*    Imaging: Ct Angio Head W Or Wo Contrast  Result Date: 02/08/2018 CLINICAL DATA:  Initial evaluation for acute right facial numbness. EXAM: CT ANGIOGRAPHY HEAD AND NECK CT PERFUSION BRAIN TECHNIQUE: Multidetector CT imaging of the head and neck was performed using the standard protocol during bolus administration of intravenous contrast. Multiplanar CT image reconstructions and MIPs were obtained to evaluate the vascular anatomy. Carotid stenosis measurements (when applicable) are obtained utilizing NASCET criteria, using the distal internal carotid diameter as the denominator. Multiphase CT imaging of the brain was performed following IV bolus contrast injection. Subsequent parametric perfusion maps were calculated using RAPID software. CONTRAST:  ISOVUE-370 IOPAMIDOL (ISOVUE-370) INJECTION 76% COMPARISON:  Prior CT from earlier the same day. Comparison also made with prior CTA from 11/30/2017 and arteriogram from 12/03/2017 FINDINGS: CTA NECK FINDINGS Aortic arch: Visualized aortic arch of normal caliber with normal 3 vessel morphology. No flow-limiting stenosis about the origin of the great vessels. Visualized subclavian arteries widely patent. Right carotid system: Right common carotid artery patent from its origin to the bifurcation without hemodynamically significant stenosis. Concentric mixed plaque about the right bifurcation with associated mild stenosis of up to 35% by NASCET criteria. Right ICA otherwise widely patent to the skull base without stenosis, dissection, or occlusion. Left carotid system: Left common carotid artery patent from its origin to the bifurcation without  stenosis. Mixed but predominantly soft plaque at the left bifurcation/proximal left ICA with associated stenosis of up to 60% by NASCET criteria (series 8, image 148). Left ICA widely patent distally to the skull base without additional stenosis, dissection, or occlusion. Vertebral arteries: Both of the vertebral arteries arise from the subclavian arteries. Mild narrowing at the origin of the left vertebral artery. Vertebral arteries otherwise widely patent within the neck without stenosis, dissection, or occlusion. Skeleton: No acute osseous abnormality. No discrete lytic or blastic osseous lesions. 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 Poor dentition with prominent dental carie at the left first mandibular molar. Other neck: No other acute soft tissue abnormality within the neck. Upper chest: Visualized upper chest demonstrates no acute finding. Partially visualized lungs are grossly clear. Mild upper lobe predominant paraseptal emphysema. Review of the MIP images confirms the above findings CTA HEAD FINDINGS Anterior circulation: Internal carotid arteries widely patent to the termini without hemodynamically significant stenosis. ICA termini well perfused and symmetric. A1 segments widely patent bilaterally. Normal anterior communicating artery. Anterior cerebral arteries widely patent to their distal aspects without stenosis. No M1 stenosis or occlusion. Normal MCA bifurcations. Distal MCA branches well perfused and symmetric. Posterior circulation: Vertebral arteries are co dominant and widely patent as they course into the cranial vault. Severe near occlusive stenosis at the distal left V4 segment just prior to the vertebrobasilar junction (series 7, image 151). Right PICA patent proximally. Moderate multifocal atheromatous irregularity within the proximal left V4 segment with up  to moderate approximate 50% stenosis. Vascular stent in place distally within the left V4 segment, extending  distally to the proximal-mid basilar artery (series 7, image 149-proximal aspect, series 7 image 128-distal aspect). Possible stenoses at the stent margins difficult to evaluate given adjacent stent markers. There is wide patency of the stent through the distal left vertebral artery and proximal basilar artery, markedly improved relative to prior CTA. Attenuated but patent flow within the proximal-mid basilar artery distal to the takeoff of the anterior inferior cerebellar arteries, with approximate moderate diffuse narrowing (series 8, image 123 coronal, series 7, image 133 axial). Basilar moderately narrowed just distal to the stent by approximately 50-60%, likely similar to previous CTA (series 7, image 124). Superior cerebellar arteries irregular but patent at their origins. Both of the PCAs appear to be primarily supplied via the basilar. Extensive atheromatous irregularity with short-segment moderate to severe proximal P1 stenoses (series 8, image 115 on the left, image 116 on the right). PCAs are patent distally but attenuated with extensive atheromatous irregularity. Venous sinuses: Patent. Anatomic variants: None significant. Delayed phase: No pathologic enhancement. Chronic left cerebellar infarcts noted. Review of the MIP images confirms the above findings CT Brain Perfusion Findings: CBF (<30%) Volume: 0mL Perfusion (Tmax>6.0s) volume: 11mL Mismatch Volume: 11mL Infarction Location:Negative CT perfusion for core infarct. Patchy perfusion deficit at the mid cerebellar region, likely related to right V4 stenosis and possibly delayed flow through the stent. IMPRESSION: 1. Negative CTA for large vessel occlusion. 2. No evidence for core infarct by CT perfusion. Mild perfusion deficit within the posterior fossa likely related to bilateral V4 stenoses. 3. 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 Widely patent flow through the proximal and mid aspect of  the stent. Attenuated flow with up to approximate 50% narrowing through the distal aspect of the stent, distal to the anterior inferior cerebral arteries. Basilar artery is patent distally, with additional moderate to advanced atheromatous irregularity involving the SCAs and PCAs bilaterally. 4. 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 5. Mild 35% atheromatous stenosis at the origin of the right ICA. 6. Widely patent anterior circulation within the head. Critical Value/emergent results were called by telephone at the time of interpretation on 02/08/2018 at 11:00 pm to Dr. Kennis Carina , who verbally acknowledged these results. Electronically Signed   By: Rise Mu M.D.   On: 02/08/2018 23:33   Ct Angio Neck W And/or Wo Contrast  Result Date: 02/08/2018 CLINICAL DATA:  Initial evaluation for acute right facial numbness. EXAM: CT ANGIOGRAPHY HEAD AND NECK CT PERFUSION BRAIN TECHNIQUE: Multidetector CT imaging of the head and neck was performed using the standard protocol during bolus administration of intravenous contrast. Multiplanar CT image reconstructions and MIPs were obtained to evaluate the vascular anatomy. Carotid stenosis measurements (when applicable) are obtained utilizing NASCET criteria, using the distal internal carotid diameter as the denominator. Multiphase CT imaging of the brain was performed following IV bolus contrast injection. Subsequent parametric perfusion maps were calculated using RAPID software. CONTRAST:  ISOVUE-370 IOPAMIDOL (ISOVUE-370) INJECTION 76% COMPARISON:  Prior CT from earlier the same day. Comparison also made with prior CTA from 11/30/2017 and arteriogram from 12/03/2017 FINDINGS: CTA NECK FINDINGS Aortic arch: Visualized aortic arch of normal caliber with normal 3 vessel morphology. No flow-limiting stenosis about the origin of the great vessels. Visualized subclavian arteries widely patent. Right carotid system: Right common carotid  artery patent from its origin to the bifurcation  without hemodynamically significant stenosis. Concentric mixed plaque about the right bifurcation with associated mild stenosis of up to 35% by NASCET criteria. Right ICA otherwise widely patent to the skull base without stenosis, dissection, or occlusion. Left carotid system: Left common carotid artery patent from its origin to the bifurcation without stenosis. Mixed but predominantly soft plaque at the left bifurcation/proximal left ICA with associated stenosis of up to 60% by NASCET criteria (series 8, image 148). Left ICA widely patent distally to the skull base without additional stenosis, dissection, or occlusion. Vertebral arteries: Both of the vertebral arteries arise from the subclavian arteries. Mild narrowing at the origin of the left vertebral artery. Vertebral arteries otherwise widely patent within the neck without stenosis, dissection, or occlusion. Skeleton: No acute osseous abnormality. No discrete lytic or blastic osseous lesions. 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 Poor dentition with prominent dental carie at the left first mandibular molar. Other neck: No other acute soft tissue abnormality within the neck. Upper chest: Visualized upper chest demonstrates no acute finding. Partially visualized lungs are grossly clear. Mild upper lobe predominant paraseptal emphysema. Review of the MIP images confirms the above findings CTA HEAD FINDINGS Anterior circulation: Internal carotid arteries widely patent to the termini without hemodynamically significant stenosis. ICA termini well perfused and symmetric. A1 segments widely patent bilaterally. Normal anterior communicating artery. Anterior cerebral arteries widely patent to their distal aspects without stenosis. No M1 stenosis or occlusion. Normal MCA bifurcations. Distal MCA branches well perfused and symmetric. Posterior circulation: Vertebral arteries are co dominant and widely  patent as they course into the cranial vault. Severe near occlusive stenosis at the distal left V4 segment just prior to the vertebrobasilar junction (series 7, image 151). Right PICA patent proximally. Moderate multifocal atheromatous irregularity within the proximal left V4 segment with up to moderate approximate 50% stenosis. Vascular stent in place distally within the left V4 segment, extending distally to the proximal-mid basilar artery (series 7, image 149-proximal aspect, series 7 image 128-distal aspect). Possible stenoses at the stent margins difficult to evaluate given adjacent stent markers. There is wide patency of the stent through the distal left vertebral artery and proximal basilar artery, markedly improved relative to prior CTA. Attenuated but patent flow within the proximal-mid basilar artery distal to the takeoff of the anterior inferior cerebellar arteries, with approximate moderate diffuse narrowing (series 8, image 123 coronal, series 7, image 133 axial). Basilar moderately narrowed just distal to the stent by approximately 50-60%, likely similar to previous CTA (series 7, image 124). Superior cerebellar arteries irregular but patent at their origins. Both of the PCAs appear to be primarily supplied via the basilar. Extensive atheromatous irregularity with short-segment moderate to severe proximal P1 stenoses (series 8, image 115 on the left, image 116 on the right). PCAs are patent distally but attenuated with extensive atheromatous irregularity. Venous sinuses: Patent. Anatomic variants: None significant. Delayed phase: No pathologic enhancement. Chronic left cerebellar infarcts noted. Review of the MIP images confirms the above findings CT Brain Perfusion Findings: CBF (<30%) Volume: 0mL Perfusion (Tmax>6.0s) volume: 11mL Mismatch Volume: 11mL Infarction Location:Negative CT perfusion for core infarct. Patchy perfusion deficit at the mid cerebellar region, likely related to right V4 stenosis  and possibly delayed flow through the stent. IMPRESSION: 1. Negative CTA for large vessel occlusion. 2. No evidence for core infarct by CT perfusion. Mild perfusion deficit within the posterior fossa likely related to bilateral V4 stenoses. 3. Vertebrobasilar stent in place extending from the left V4 segment through the  mid basilar artery. Widely patent flow through the proximal and mid aspect of the stent. Attenuated flow with up to approximate 50% narrowing through the distal aspect of the stent, distal to the anterior inferior cerebral arteries. Basilar artery is patent distally, with additional moderate to advanced atheromatous irregularity involving the SCAs and PCAs bilaterally. 4. 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 5. Mild 35% atheromatous stenosis at the origin of the right ICA. 6. Widely patent anterior circulation within the head. Critical Value/emergent results were called by telephone at the time of interpretation on 02/08/2018 at 11:00 pm to Dr. Kennis Carina , who verbally acknowledged these results. Electronically Signed   By: Rise Mu M.D.   On: 02/08/2018 23:33   Mr Brain Wo Contrast  Result Date: 02/09/2018 CLINICAL DATA:  Initial evaluation for acute headache with right-sided facial numbness. EXAM: MRI HEAD WITHOUT CONTRAST TECHNIQUE: Multiplanar, multiecho pulse sequences of the brain and surrounding structures were obtained without intravenous contrast. COMPARISON:  Prior CT and CTA from 02/08/2018 FINDINGS: Brain: Generalized age-related cerebral atrophy. Mild chronic microvascular ischemic disease noted involving the pons. Chronic left cerebellar infarct. Single 5 mm focus of diffusion abnormality seen within the right paramedian pons (series 5, image 66), suspicious for a small acute to early subacute ischemic infarct. No associated hemorrhage. No other evidence for acute or subacute ischemia. Gray-white matter differentiation otherwise maintained. No  other areas of chronic cortical infarction. No acute intracranial hemorrhage. Small amount of chronic hemosiderin staining noted about the chronic left cerebellar infarct. No mass lesion, midline shift or mass effect. No hydrocephalus. No extra-axial fluid collection. Pituitary gland within normal limits. 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 Major intracranial vascular flow voids otherwise maintained. Skull and upper cervical spine: Craniocervical junction within normal limits. Bone marrow signal intensity normal. No scalp soft tissue abnormality. Sinuses/Orbits: Globes and orbital soft tissues within normal limits. Scattered mucosal thickening and opacity within the ethmoidal air cells. Paranasal sinuses are otherwise clear. No significant mastoid effusion. Inner ear structures grossly normal. Other: None. IMPRESSION: 1. Punctate 5 mm focus of diffusion abnormality involving the right paramedian pons, suspicious for a small acute to early subacute small vessel type infarct. No associated hemorrhage. 2. No other acute intracranial abnormality. 3. Small chronic left cerebellar infarct. Electronically Signed   By: Rise Mu M.D.   On: 02/09/2018 05:44   Ct Cerebral Perfusion W Contrast  Result Date: 02/08/2018 CLINICAL DATA:  Initial evaluation for acute right facial numbness. EXAM: CT ANGIOGRAPHY HEAD AND NECK CT PERFUSION BRAIN TECHNIQUE: Multidetector CT imaging of the head and neck was performed using the standard protocol during bolus administration of intravenous contrast. Multiplanar CT image reconstructions and MIPs were obtained to evaluate the vascular anatomy. Carotid stenosis measurements (when applicable) are obtained utilizing NASCET criteria, using the distal internal carotid diameter as the denominator. Multiphase CT imaging of the brain was performed following IV bolus  contrast injection. Subsequent parametric perfusion maps were calculated using RAPID software. CONTRAST:  ISOVUE-370 IOPAMIDOL (ISOVUE-370) INJECTION 76% COMPARISON:  Prior CT from earlier the same day. Comparison also made with prior CTA from 11/30/2017 and arteriogram from 12/03/2017 FINDINGS: CTA NECK FINDINGS Aortic arch: Visualized aortic arch of normal caliber with normal 3 vessel morphology. No flow-limiting stenosis about the origin of the great vessels. Visualized subclavian arteries widely patent. Right carotid system: Right common carotid artery patent from its  origin to the bifurcation without hemodynamically significant stenosis. Concentric mixed plaque about the right bifurcation with associated mild stenosis of up to 35% by NASCET criteria. Right ICA otherwise widely patent to the skull base without stenosis, dissection, or occlusion. Left carotid system: Left common carotid artery patent from its origin to the bifurcation without stenosis. Mixed but predominantly soft plaque at the left bifurcation/proximal left ICA with associated stenosis of up to 60% by NASCET criteria (series 8, image 148). Left ICA widely patent distally to the skull base without additional stenosis, dissection, or occlusion. Vertebral arteries: Both of the vertebral arteries arise from the subclavian arteries. Mild narrowing at the origin of the left vertebral artery. Vertebral arteries otherwise widely patent within the neck without stenosis, dissection, or occlusion. Skeleton: No acute osseous abnormality. No discrete lytic or blastic osseous lesions. 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 Poor dentition with prominent dental carie at the left first mandibular molar. Other neck: No other acute soft tissue abnormality within the neck. Upper chest: Visualized upper chest demonstrates no acute finding. Partially visualized lungs are grossly clear. Mild upper lobe predominant paraseptal emphysema. Review  of the MIP images confirms the above findings CTA HEAD FINDINGS Anterior circulation: Internal carotid arteries widely patent to the termini without hemodynamically significant stenosis. ICA termini well perfused and symmetric. A1 segments widely patent bilaterally. Normal anterior communicating artery. Anterior cerebral arteries widely patent to their distal aspects without stenosis. No M1 stenosis or occlusion. Normal MCA bifurcations. Distal MCA branches well perfused and symmetric. Posterior circulation: Vertebral arteries are co dominant and widely patent as they course into the cranial vault. Severe near occlusive stenosis at the distal left V4 segment just prior to the vertebrobasilar junction (series 7, image 151). Right PICA patent proximally. Moderate multifocal atheromatous irregularity within the proximal left V4 segment with up to moderate approximate 50% stenosis. Vascular stent in place distally within the left V4 segment, extending distally to the proximal-mid basilar artery (series 7, image 149-proximal aspect, series 7 image 128-distal aspect). Possible stenoses at the stent margins difficult to evaluate given adjacent stent markers. There is wide patency of the stent through the distal left vertebral artery and proximal basilar artery, markedly improved relative to prior CTA. Attenuated but patent flow within the proximal-mid basilar artery distal to the takeoff of the anterior inferior cerebellar arteries, with approximate moderate diffuse narrowing (series 8, image 123 coronal, series 7, image 133 axial). Basilar moderately narrowed just distal to the stent by approximately 50-60%, likely similar to previous CTA (series 7, image 124). Superior cerebellar arteries irregular but patent at their origins. Both of the PCAs appear to be primarily supplied via the basilar. Extensive atheromatous irregularity with short-segment moderate to severe proximal P1 stenoses (series 8, image 115 on the left,  image 116 on the right). PCAs are patent distally but attenuated with extensive atheromatous irregularity. Venous sinuses: Patent. Anatomic variants: None significant. Delayed phase: No pathologic enhancement. Chronic left cerebellar infarcts noted. Review of the MIP images confirms the above findings CT Brain Perfusion Findings: CBF (<30%) Volume: 0mL Perfusion (Tmax>6.0s) volume: 11mL Mismatch Volume: 11mL Infarction Location:Negative CT perfusion for core infarct. Patchy perfusion deficit at the mid cerebellar region, likely related to right V4 stenosis and possibly delayed flow through the stent. IMPRESSION: 1. Negative CTA for large vessel occlusion. 2. No evidence for core infarct by CT perfusion. Mild perfusion deficit within the posterior fossa likely related to bilateral V4 stenoses. 3. Vertebrobasilar stent in place extending from the left  V4 segment through the mid basilar artery. Widely patent flow through the proximal and mid aspect of the stent. Attenuated flow with up to approximate 50% narrowing through the distal aspect of the stent, distal to the anterior inferior cerebral arteries. Basilar artery is patent distally, with additional moderate to advanced atheromatous irregularity involving the SCAs and PCAs bilaterally. 4. 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 5. Mild 35% atheromatous stenosis at the origin of the right ICA. 6. Widely patent anterior circulation within the head. Critical Value/emergent results were called by telephone at the time of interpretation on 02/08/2018 at 11:00 pm to Dr. Kennis Carina , who verbally acknowledged these results. Electronically Signed   By: Rise Mu M.D.   On: 02/08/2018 23:33   Ct Head Code Stroke Wo Contrast  Result Date: 02/08/2018 CLINICAL DATA:  Code stroke.  Right-sided facial numbness EXAM: CT HEAD WITHOUT CONTRAST TECHNIQUE: Contiguous axial images were obtained from the base of the skull through the vertex without  intravenous contrast. COMPARISON:  None. FINDINGS: Brain: There is no mass, hemorrhage or extra-axial collection. The size and configuration of the ventricles and extra-axial CSF spaces are normal. There is hypoattenuation of the periventricular white matter, most commonly indicating chronic ischemic microangiopathy. Vascular: No abnormal hyperdensity of the major intracranial arteries or dural venous sinuses. No intracranial atherosclerosis. Skull: Right parietal scalp hematoma.  No skull fracture. Sinuses/Orbits: No fluid levels or advanced mucosal thickening of the visualized paranasal sinuses. No mastoid or middle ear effusion. The orbits are normal. ASPECTS [facility] Stroke Program Early CT Score) - Ganglionic level infarction (caudate, lentiform nuclei, internal capsule, insula, M1-M3 cortex): 7 - Supraganglionic infarction (M4-M6 cortex): 3 Total score (0-10 with 10 being normal): 10 IMPRESSION: 1. No acute intracranial abnormality. 2. ASPECTS is 10. 3. Right parietal scalp hematoma without skull fracture. * These results were called by telephone at the time of interpretation on 02/08/2018 at 8:58 pm to Dr. Kennis Carina , who verbally acknowledged these results. Electronically Signed   By: Deatra Robinson M.D.   On: 02/08/2018 20:59       Valentina Lucks, MSN, NP-C [facility] [phone]  02/09/2018, 9:16 AM   Attending physician note to follow with Assessment and plan .  I have seen the patient and reviewed the above note.  Assessment: 47 y.o. 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 CT head: no hemorrhage, CTA: no LVO. MRI: 5 mm punctate infarct right paramedian pons.   Unfortunately he is already fairly aggressively managed.  By far his biggest risk factor at this point that his modifiable is smoking cessation.  I impressed on him the  importance of stopping smoking.  He is also having trouble with insurance and sleep apnea screening, I think that he be a good candidate for the sleep SMART study.  Stroke Risk Factors - hyperlipidemia, hypertension and smoking    Recommendations: -- BP goal : Permissive HTN upto 220/110 mmHg  --MRI Brain ( done) --CTA ( done)  --Echocardiogram ( pending) -- ASA, brilinta -- High intensity Statin if LDL > 70 -- HgbA1c, fasting lipid panel -- PT consult, OT consult, Speech consult --Telemetry monitoring --Frequent neuro checks --Stroke swallow screen -smoking cessation  --please page stroke NP  Or  PA  Or MD from 8am -4 pm  as this patient from this time will be  followed by the  stroke.   You can look them up on [email or web address]  Password [email or web address]  Ritta Slot, MD [facility] [phone]  If 7pm- 7am, please page neurology on call as listed in AMION.

## 2018-02-09 NOTE — Progress Notes (Signed)
  Echocardiogram 2D Echocardiogram has been performed.  Devin [name] 02/09/2018, 8:54 AM

## 2018-02-09 NOTE — Progress Notes (Signed)
[facility].... Pt has not been transported at this time

## 2018-02-09 NOTE — Progress Notes (Signed)
PROGRESS NOTE    KAVEON BLATZ  ONG:[id number] DOB: 1970/12/16 DOA: 02/08/2018 PCP: [facility], [facility]   Brief Narrative:  Per HPI from Dr. Antionette Char: Devin [name] is a 47 y.o. 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  Patient reports that he was in his usual state of health, having an uneventful day, and developed acute onset of numbness involving his right face.  He also reports a mild generalized headache associated with this.  He denies any change in vision or hearing, and denies any numbness or weakness in the extremities.  Family has not appreciated any change in his speech.  Symptoms developed approximately 30 minutes prior to arrival in the ED.  Patient denies chest pain or palpitations.  Reports adherence with his medications.  Patient was admitted for CVA evaluation and was noted to have a small CVA on Brain MRI. His R facial numbness appears to have cleared this am. Neurology would like to keep him overnight as he is a candidate for a particular clinical trial (sleep SMART study).  Assessment & Plan:   Principal Problem:   Acute focal neurological deficit Active Problems:   Hypertension   Hypothyroidism   Type 2 diabetes mellitus without complications (HCC)   Coronary artery disease   Basilar artery occlusion   History of stroke   Chronic combined systolic and diastolic CHF (congestive heart failure) (HCC)   1. Acute R facial numbness-resolved; with prior hx of CVA. Management per Neurology; appreciate recommendations. Pt to stay overnight for sleep SMART study. 2. CAD. No chest pain currently noted. Continue on current medications. 3. Chronic combined  systolic and diastolic CHF. Currently compensated. 4. DMII. Continue on SSI while in hospital and resume glimiperide and Janumet on DC. 5. HTN. Continue to monitor. Losartan currently held. 6. Hypothyroidism. Continue Synthroid. 7. Tobacco abuse. Counseled on cessation.   DVT prophylaxis:Lovenox Code Status: Full Family Communication: None at bedside Disposition Plan: Staying overnight as part of Neurology research study per Dr. Pearlean Brownie; likely DC in am   Consultants:   Neurology  Procedures:   None  Antimicrobials:   None   Subjective: Patient seen and evaluated today with no new acute complaints or concerns. No acute concerns or events noted overnight. His facial numbness appears to have resolved.  Objective: Vitals:   02/09/18 0753 02/09/18 0830 02/09/18 1030 02/09/18 1230  BP: 103/65 124/81 (!) 123/91 133/86  Pulse: 75     Resp: 18     Temp: 98.2 F (36.8 C) 98.4 F (36.9 C) (!) 97.2 F (36.2 C) (!) 97.3 F (36.3 C)  TempSrc: Axillary Oral Oral Oral  SpO2:      Weight:      Height:       No intake or output data in the 24 hours ending 02/09/18 1652 Filed Weights   02/09/18 0000  Weight: 122.3 kg    Examination:  General exam: Appears calm and comfortable  Respiratory system: Clear to auscultation. Respiratory effort normal. Cardiovascular system: S1 & S2 heard, RRR. No JVD, murmurs, rubs, gallops or clicks. No pedal edema. Gastrointestinal system: Abdomen is nondistended, soft and nontender. No organomegaly or masses felt. Normal bowel sounds heard. Central nervous system: Alert and oriented. No focal neurological deficits. Extremities: Symmetric 5 x  5 power. Skin: No rashes, lesions or ulcers Psychiatry: Judgement and insight appear normal. Mood & affect appropriate.     Data Reviewed: I have personally reviewed following labs and imaging studies  CBC: Recent Labs  Lab 02/08/18 2104 02/08/18 2107  WBC 9.9  --   NEUTROABS 4.6  --   HGB 13.6  14.3  HCT 42.9 42.0  MCV 89.6  --   PLT 352  --    Basic Metabolic Panel: Recent Labs  Lab 02/08/18 2104 02/08/18 2107  NA 139 140  K 3.8 3.9  CL 104 105  CO2 24  --   GLUCOSE 114* 109*  BUN 18 20  CREATININE 1.21 1.30*  CALCIUM 9.6  --    GFR: Estimated Creatinine Clearance: 104.2 mL/min (A) (by C-G formula based on SCr of 1.3 mg/dL (H)). Liver Function Tests: Recent Labs  Lab 02/08/18 2104  AST 16  ALT 15  ALKPHOS 63  BILITOT 0.4  PROT 8.6*  ALBUMIN 4.5   No results for input(s): LIPASE, AMYLASE in the last 168 hours. No results for input(s): AMMONIA in the last 168 hours. Coagulation Profile: Recent Labs  Lab 02/08/18 2104  INR 0.98   Cardiac Enzymes: No results for input(s): CKTOTAL, CKMB, CKMBINDEX, TROPONINI in the last 168 hours. BNP (last 3 results) No results for input(s): PROBNP in the last 8760 hours. HbA1C: No results for input(s): HGBA1C in the last 72 hours. CBG: Recent Labs  Lab 02/08/18 2057 02/09/18 0221 02/09/18 1149 02/09/18 1605  GLUCAP 116* 135* 126* 117*   Lipid Profile: No results for input(s): CHOL, HDL, LDLCALC, TRIG, CHOLHDL, LDLDIRECT in the last 72 hours. Thyroid Function Tests: No results for input(s): TSH, T4TOTAL, FREET4, T3FREE, THYROIDAB in the last 72 hours. Anemia Panel: No results for input(s): VITAMINB12, FOLATE, FERRITIN, TIBC, IRON, RETICCTPCT in the last 72 hours. Sepsis Labs: No results for input(s): PROCALCITON, LATICACIDVEN in the last 168 hours.  No results found for this or any previous visit (from the past 240 hour(s)).       Radiology Studies: Ct Angio Head W Or Wo Contrast  Result Date: 02/08/2018 CLINICAL DATA:  Initial evaluation for acute right facial numbness. EXAM: CT ANGIOGRAPHY HEAD AND NECK CT PERFUSION BRAIN TECHNIQUE: Multidetector CT imaging of the head and neck was performed using the standard protocol during bolus administration of intravenous contrast. Multiplanar CT image  reconstructions and MIPs were obtained to evaluate the vascular anatomy. Carotid stenosis measurements (when applicable) are obtained utilizing NASCET criteria, using the distal internal carotid diameter as the denominator. Multiphase CT imaging of the brain was performed following IV bolus contrast injection. Subsequent parametric perfusion maps were calculated using RAPID software. CONTRAST:  ISOVUE-370 IOPAMIDOL (ISOVUE-370) INJECTION 76% COMPARISON:  Prior CT from earlier the same day. Comparison also made with prior CTA from 11/30/2017 and arteriogram from 12/03/2017 FINDINGS: CTA NECK FINDINGS Aortic arch: Visualized aortic arch of normal caliber with normal 3 vessel morphology. No flow-limiting stenosis about the origin of the great vessels. Visualized subclavian arteries widely patent. Right carotid system: Right common carotid artery patent from its origin to the bifurcation without hemodynamically significant stenosis. Concentric mixed plaque about the right bifurcation with associated mild stenosis of up to 35% by NASCET criteria. Right ICA otherwise widely patent to the skull base without stenosis, dissection, or occlusion. Left carotid system: Left common carotid artery patent from its origin to the bifurcation without stenosis. Mixed but predominantly soft plaque at the left bifurcation/proximal left ICA with  associated stenosis of up to 60% by NASCET criteria (series 8, image 148). Left ICA widely patent distally to the skull base without additional stenosis, dissection, or occlusion. Vertebral arteries: Both of the vertebral arteries arise from the subclavian arteries. Mild narrowing at the origin of the left vertebral artery. Vertebral arteries otherwise widely patent within the neck without stenosis, dissection, or occlusion. Skeleton: No acute osseous abnormality. No discrete lytic or blastic osseous lesions. 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 Poor dentition with  prominent dental carie at the left first mandibular molar. Other neck: No other acute soft tissue abnormality within the neck. Upper chest: Visualized upper chest demonstrates no acute finding. Partially visualized lungs are grossly clear. Mild upper lobe predominant paraseptal emphysema. Review of the MIP images confirms the above findings CTA HEAD FINDINGS Anterior circulation: Internal carotid arteries widely patent to the termini without hemodynamically significant stenosis. ICA termini well perfused and symmetric. A1 segments widely patent bilaterally. Normal anterior communicating artery. Anterior cerebral arteries widely patent to their distal aspects without stenosis. No M1 stenosis or occlusion. Normal MCA bifurcations. Distal MCA branches well perfused and symmetric. Posterior circulation: Vertebral arteries are co dominant and widely patent as they course into the cranial vault. Severe near occlusive stenosis at the distal left V4 segment just prior to the vertebrobasilar junction (series 7, image 151). Right PICA patent proximally. Moderate multifocal atheromatous irregularity within the proximal left V4 segment with up to moderate approximate 50% stenosis. Vascular stent in place distally within the left V4 segment, extending distally to the proximal-mid basilar artery (series 7, image 149-proximal aspect, series 7 image 128-distal aspect). Possible stenoses at the stent margins difficult to evaluate given adjacent stent markers. There is wide patency of the stent through the distal left vertebral artery and proximal basilar artery, markedly improved relative to prior CTA. Attenuated but patent flow within the proximal-mid basilar artery distal to the takeoff of the anterior inferior cerebellar arteries, with approximate moderate diffuse narrowing (series 8, image 123 coronal, series 7, image 133 axial). Basilar moderately narrowed just distal to the stent by approximately 50-60%, likely similar to  previous CTA (series 7, image 124). Superior cerebellar arteries irregular but patent at their origins. Both of the PCAs appear to be primarily supplied via the basilar. Extensive atheromatous irregularity with short-segment moderate to severe proximal P1 stenoses (series 8, image 115 on the left, image 116 on the right). PCAs are patent distally but attenuated with extensive atheromatous irregularity. Venous sinuses: Patent. Anatomic variants: None significant. Delayed phase: No pathologic enhancement. Chronic left cerebellar infarcts noted. Review of the MIP images confirms the above findings CT Brain Perfusion Findings: CBF (<30%) Volume: 0mL Perfusion (Tmax>6.0s) volume: 11mL Mismatch Volume: 11mL Infarction Location:Negative CT perfusion for core infarct. Patchy perfusion deficit at the mid cerebellar region, likely related to right V4 stenosis and possibly delayed flow through the stent. IMPRESSION: 1. Negative CTA for large vessel occlusion. 2. No evidence for core infarct by CT perfusion. Mild perfusion deficit within the posterior fossa likely related to bilateral V4 stenoses. 3. 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 Widely patent flow through the proximal and mid aspect of the stent. Attenuated flow with up to approximate 50% narrowing through the distal aspect of the stent, distal to the anterior inferior cerebral arteries. Basilar artery is patent distally, with additional moderate to advanced atheromatous irregularity involving the SCAs and PCAs bilaterally. 4. 60% atheromatous stenosis at the origin of the  left ICA. 5. Mild 35% atheromatous stenosis at the origin of the right ICA. 6. Widely patent anterior circulation within the head. Critical Value/emergent results were called by telephone at the time of interpretation on 02/08/2018 at 11:00 pm to Dr. Kennis Carina , who verbally acknowledged these results. Electronically Signed   By: Rise Mu M.D.   On: 02/08/2018 23:33   Ct Angio Neck W And/or Wo Contrast  Result Date: 02/08/2018 CLINICAL DATA:  Initial evaluation for acute right facial numbness. EXAM: CT ANGIOGRAPHY HEAD AND NECK CT PERFUSION BRAIN TECHNIQUE: Multidetector CT imaging of the head and neck was performed using the standard protocol during bolus administration of intravenous contrast. Multiplanar CT image reconstructions and MIPs were obtained to evaluate the vascular anatomy. Carotid stenosis measurements (when applicable) are obtained utilizing NASCET criteria, using the distal internal carotid diameter as the denominator. Multiphase CT imaging of the brain was performed following IV bolus contrast injection. Subsequent parametric perfusion maps were calculated using RAPID software. CONTRAST:  ISOVUE-370 IOPAMIDOL (ISOVUE-370) INJECTION 76% COMPARISON:  Prior CT from earlier the same day. Comparison also made with prior CTA from 11/30/2017 and arteriogram from 12/03/2017 FINDINGS: CTA NECK FINDINGS Aortic arch: Visualized aortic arch of normal caliber with normal 3 vessel morphology. No flow-limiting stenosis about the origin of the great vessels. Visualized subclavian arteries widely patent. Right carotid system: Right common carotid artery patent from its origin to the bifurcation without hemodynamically significant stenosis. Concentric mixed plaque about the right bifurcation with associated mild stenosis of up to 35% by NASCET criteria. Right ICA otherwise widely patent to the skull base without stenosis, dissection, or occlusion. Left carotid system: Left common carotid artery patent from its origin to the bifurcation without stenosis. Mixed but predominantly soft plaque at the left bifurcation/proximal left ICA with associated stenosis of up to 60% by NASCET criteria (series 8, image 148). Left ICA widely patent distally to the skull base without additional stenosis, dissection, or occlusion. Vertebral  arteries: Both of the vertebral arteries arise from the subclavian arteries. Mild narrowing at the origin of the left vertebral artery. Vertebral arteries otherwise widely patent within the neck without stenosis, dissection, or occlusion. Skeleton: No acute osseous abnormality. No discrete lytic or blastic osseous lesions. 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 Poor dentition with prominent dental carie at the left first mandibular molar. Other neck: No other acute soft tissue abnormality within the neck. Upper chest: Visualized upper chest demonstrates no acute finding. Partially visualized lungs are grossly clear. Mild upper lobe predominant paraseptal emphysema. Review of the MIP images confirms the above findings CTA HEAD FINDINGS Anterior circulation: Internal carotid arteries widely patent to the termini without hemodynamically significant stenosis. ICA termini well perfused and symmetric. A1 segments widely patent bilaterally. Normal anterior communicating artery. Anterior cerebral arteries widely patent to their distal aspects without stenosis. No M1 stenosis or occlusion. Normal MCA bifurcations. Distal MCA branches well perfused and symmetric. Posterior circulation: Vertebral arteries are co dominant and widely patent as they course into the cranial vault. Severe near occlusive stenosis at the distal left V4 segment just prior to the vertebrobasilar junction (series 7, image 151). Right PICA patent proximally. Moderate multifocal atheromatous irregularity within the proximal left V4 segment with up to moderate approximate 50% stenosis. Vascular stent in place distally within the left V4 segment, extending distally to the proximal-mid basilar artery (series 7, image 149-proximal aspect, series 7 image 128-distal aspect). Possible stenoses at the stent margins difficult to evaluate given adjacent  stent markers. There is wide patency of the stent through the distal left vertebral artery and  proximal basilar artery, markedly improved relative to prior CTA. Attenuated but patent flow within the proximal-mid basilar artery distal to the takeoff of the anterior inferior cerebellar arteries, with approximate moderate diffuse narrowing (series 8, image 123 coronal, series 7, image 133 axial). Basilar moderately narrowed just distal to the stent by approximately 50-60%, likely similar to previous CTA (series 7, image 124). Superior cerebellar arteries irregular but patent at their origins. Both of the PCAs appear to be primarily supplied via the basilar. Extensive atheromatous irregularity with short-segment moderate to severe proximal P1 stenoses (series 8, image 115 on the left, image 116 on the right). PCAs are patent distally but attenuated with extensive atheromatous irregularity. Venous sinuses: Patent. Anatomic variants: None significant. Delayed phase: No pathologic enhancement. Chronic left cerebellar infarcts noted. Review of the MIP images confirms the above findings CT Brain Perfusion Findings: CBF (<30%) Volume: 0mL Perfusion (Tmax>6.0s) volume: 11mL Mismatch Volume: 11mL Infarction Location:Negative CT perfusion for core infarct. Patchy perfusion deficit at the mid cerebellar region, likely related to right V4 stenosis and possibly delayed flow through the stent. IMPRESSION: 1. Negative CTA for large vessel occlusion. 2. No evidence for core infarct by CT perfusion. Mild perfusion deficit within the posterior fossa likely related to bilateral V4 stenoses. 3. 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 Widely patent flow through the proximal and mid aspect of the stent. Attenuated flow with up to approximate 50% narrowing through the distal aspect of the stent, distal to the anterior inferior cerebral arteries. Basilar artery is patent distally, with additional moderate to advanced atheromatous irregularity involving the SCAs and PCAs  bilaterally. 4. 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 5. Mild 35% atheromatous stenosis at the origin of the right ICA. 6. Widely patent anterior circulation within the head. Critical Value/emergent results were called by telephone at the time of interpretation on 02/08/2018 at 11:00 pm to Dr. Kennis Carina , who verbally acknowledged these results. Electronically Signed   By: Rise Mu M.D.   On: 02/08/2018 23:33   Mr Brain Wo Contrast  Result Date: 02/09/2018 CLINICAL DATA:  Initial evaluation for acute headache with right-sided facial numbness. EXAM: MRI HEAD WITHOUT CONTRAST TECHNIQUE: Multiplanar, multiecho pulse sequences of the brain and surrounding structures were obtained without intravenous contrast. COMPARISON:  Prior CT and CTA from 02/08/2018 FINDINGS: Brain: Generalized age-related cerebral atrophy. Mild chronic microvascular ischemic disease noted involving the pons. Chronic left cerebellar infarct. Single 5 mm focus of diffusion abnormality seen within the right paramedian pons (series 5, image 66), suspicious for a small acute to early subacute ischemic infarct. No associated hemorrhage. No other evidence for acute or subacute ischemia. Gray-white matter differentiation otherwise maintained. No other areas of chronic cortical infarction. No acute intracranial hemorrhage. Small amount of chronic hemosiderin staining noted about the chronic left cerebellar infarct. No mass lesion, midline shift or mass effect. No hydrocephalus. No extra-axial fluid collection. Pituitary gland within normal limits. 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 Major intracranial vascular flow voids otherwise maintained. Skull and upper cervical spine: Craniocervical junction within normal limits. Bone marrow signal intensity normal. No scalp soft tissue abnormality. Sinuses/Orbits:  Globes and orbital soft tissues within normal limits. Scattered mucosal thickening and opacity within the ethmoidal air cells. Paranasal  sinuses are otherwise clear. No significant mastoid effusion. Inner ear structures grossly normal. Other: None. IMPRESSION: 1. Punctate 5 mm focus of diffusion abnormality involving the right paramedian pons, suspicious for a small acute to early subacute small vessel type infarct. No associated hemorrhage. 2. No other acute intracranial abnormality. 3. Small chronic left cerebellar infarct. Electronically Signed   By: Rise Mu M.D.   On: 02/09/2018 05:44   Ct Cerebral Perfusion W Contrast  Result Date: 02/08/2018 CLINICAL DATA:  Initial evaluation for acute right facial numbness. EXAM: CT ANGIOGRAPHY HEAD AND NECK CT PERFUSION BRAIN TECHNIQUE: Multidetector CT imaging of the head and neck was performed using the standard protocol during bolus administration of intravenous contrast. Multiplanar CT image reconstructions and MIPs were obtained to evaluate the vascular anatomy. Carotid stenosis measurements (when applicable) are obtained utilizing NASCET criteria, using the distal internal carotid diameter as the denominator. Multiphase CT imaging of the brain was performed following IV bolus contrast injection. Subsequent parametric perfusion maps were calculated using RAPID software. CONTRAST:  ISOVUE-370 IOPAMIDOL (ISOVUE-370) INJECTION 76% COMPARISON:  Prior CT from earlier the same day. Comparison also made with prior CTA from 11/30/2017 and arteriogram from 12/03/2017 FINDINGS: CTA NECK FINDINGS Aortic arch: Visualized aortic arch of normal caliber with normal 3 vessel morphology. No flow-limiting stenosis about the origin of the great vessels. Visualized subclavian arteries widely patent. Right carotid system: Right common carotid artery patent from its origin to the bifurcation without hemodynamically significant stenosis. Concentric mixed plaque about  the right bifurcation with associated mild stenosis of up to 35% by NASCET criteria. Right ICA otherwise widely patent to the skull base without stenosis, dissection, or occlusion. Left carotid system: Left common carotid artery patent from its origin to the bifurcation without stenosis. Mixed but predominantly soft plaque at the left bifurcation/proximal left ICA with associated stenosis of up to 60% by NASCET criteria (series 8, image 148). Left ICA widely patent distally to the skull base without additional stenosis, dissection, or occlusion. Vertebral arteries: Both of the vertebral arteries arise from the subclavian arteries. Mild narrowing at the origin of the left vertebral artery. Vertebral arteries otherwise widely patent within the neck without stenosis, dissection, or occlusion. Skeleton: No acute osseous abnormality. No discrete lytic or blastic osseous lesions. 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 Poor dentition with prominent dental carie at the left first mandibular molar. Other neck: No other acute soft tissue abnormality within the neck. Upper chest: Visualized upper chest demonstrates no acute finding. Partially visualized lungs are grossly clear. Mild upper lobe predominant paraseptal emphysema. Review of the MIP images confirms the above findings CTA HEAD FINDINGS Anterior circulation: Internal carotid arteries widely patent to the termini without hemodynamically significant stenosis. ICA termini well perfused and symmetric. A1 segments widely patent bilaterally. Normal anterior communicating artery. Anterior cerebral arteries widely patent to their distal aspects without stenosis. No M1 stenosis or occlusion. Normal MCA bifurcations. Distal MCA branches well perfused and symmetric. Posterior circulation: Vertebral arteries are co dominant and widely patent as they course into the cranial vault. Severe near occlusive stenosis at the distal left V4 segment just prior to the  vertebrobasilar junction (series 7, image 151). Right PICA patent proximally. Moderate multifocal atheromatous irregularity within the proximal left V4 segment with up to moderate approximate 50% stenosis. Vascular stent in place distally within the left V4 segment, extending distally to the proximal-mid basilar artery (series 7, image 149-proximal aspect, series 7 image 128-distal aspect). Possible stenoses at the stent margins  difficult to evaluate given adjacent stent markers. There is wide patency of the stent through the distal left vertebral artery and proximal basilar artery, markedly improved relative to prior CTA. Attenuated but patent flow within the proximal-mid basilar artery distal to the takeoff of the anterior inferior cerebellar arteries, with approximate moderate diffuse narrowing (series 8, image 123 coronal, series 7, image 133 axial). Basilar moderately narrowed just distal to the stent by approximately 50-60%, likely similar to previous CTA (series 7, image 124). Superior cerebellar arteries irregular but patent at their origins. Both of the PCAs appear to be primarily supplied via the basilar. Extensive atheromatous irregularity with short-segment moderate to severe proximal P1 stenoses (series 8, image 115 on the left, image 116 on the right). PCAs are patent distally but attenuated with extensive atheromatous irregularity. Venous sinuses: Patent. Anatomic variants: None significant. Delayed phase: No pathologic enhancement. Chronic left cerebellar infarcts noted. Review of the MIP images confirms the above findings CT Brain Perfusion Findings: CBF (<30%) Volume: 0mL Perfusion (Tmax>6.0s) volume: 11mL Mismatch Volume: 11mL Infarction Location:Negative CT perfusion for core infarct. Patchy perfusion deficit at the mid cerebellar region, likely related to right V4 stenosis and possibly delayed flow through the stent. IMPRESSION: 1. Negative CTA for large vessel occlusion. 2. No evidence for core  infarct by CT perfusion. Mild perfusion deficit within the posterior fossa likely related to bilateral V4 stenoses. 3. 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 Widely patent flow through the proximal and mid aspect of the stent. Attenuated flow with up to approximate 50% narrowing through the distal aspect of the stent, distal to the anterior inferior cerebral arteries. Basilar artery is patent distally, with additional moderate to advanced atheromatous irregularity involving the SCAs and PCAs bilaterally. 4. 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 5. Mild 35% atheromatous stenosis at the origin of the right ICA. 6. Widely patent anterior circulation within the head. Critical Value/emergent results were called by telephone at the time of interpretation on 02/08/2018 at 11:00 pm to Dr. Kennis CarinaMICHAEL BERO , who verbally acknowledged these results. Electronically Signed   By: Rise MuBenjamin  McClintock M.D.   On: 02/08/2018 23:33   Ct Head Code Stroke Wo Contrast  Result Date: 02/08/2018 CLINICAL DATA:  Code stroke.  Right-sided facial numbness EXAM: CT HEAD WITHOUT CONTRAST TECHNIQUE: Contiguous axial images were obtained from the base of the skull through the vertex without intravenous contrast. COMPARISON:  None. FINDINGS: Brain: There is no mass, hemorrhage or extra-axial collection. The size and configuration of the ventricles and extra-axial CSF spaces are normal. There is hypoattenuation of the periventricular white matter, most commonly indicating chronic ischemic microangiopathy. Vascular: No abnormal hyperdensity of the major intracranial arteries or dural venous sinuses. No intracranial atherosclerosis. Skull: Right parietal scalp hematoma.  No skull fracture. Sinuses/Orbits: No fluid levels or advanced mucosal thickening of the visualized paranasal sinuses. No mastoid or middle ear effusion. The orbits are normal. ASPECTS [facility]([place] Stroke  Program Early CT Score) - Ganglionic level infarction (caudate, lentiform nuclei, internal capsule, insula, M1-M3 cortex): 7 - Supraganglionic infarction (M4-M6 cortex): 3 Total score (0-10 with 10 being normal): 10 IMPRESSION: 1. No acute intracranial abnormality. 2. ASPECTS is 10. 3. Right parietal scalp hematoma without skull fracture. * These results were called by telephone at the time of interpretation on 02/08/2018 at 8:58 pm to Dr. Kennis CarinaMICHAEL BERO , who verbally acknowledged these results. Electronically Signed   By: Chrisandra NettersKevin  Herman M.D.  On: 02/08/2018 20:59        Scheduled Meds: . aspirin EC  81 mg Oral Daily  . citalopram  10 mg Oral Daily  . enoxaparin (LOVENOX) injection  40 mg Subcutaneous Q24H  . ezetimibe  10 mg Oral Daily  . gabapentin  300 mg Oral BID  . insulin aspart  0-5 Units Subcutaneous QHS  . insulin aspart  0-9 Units Subcutaneous TID WC  . levothyroxine  175 mcg Oral QAC breakfast  . pantoprazole  40 mg Oral q morning - 10a  . sodium chloride flush  3 mL Intravenous Q12H  . ticagrelor  90 mg Oral BID   Continuous Infusions: . sodium chloride       LOS: 0 days    Time spent: 30 minutes    Nancylee Gaines Hoover Brunette, DO [facility] Pager [phone]  If 7PM-7AM, please contact night-coverage [email or web address] Password [facility] 02/09/2018, 4:52 PM

## 2018-02-09 NOTE — Progress Notes (Signed)
SLP Cancellation Note  Patient Details Name: Varney DailyCarl L Jesson MRN: [id number] DOB: 1970-04-22   Cancelled treatment:       Reason Eval/Treat Not Completed: Other (comment)(scheduling)   Tressie StalkerPat Xoie Kreuser, M.S., CCC-SLP 02/09/2018, 4:34 PM

## 2018-02-09 NOTE — Evaluation (Addendum)
Occupational Therapy Evaluation and Discharge Patient Details Name: Devin [name] MRN: [id number] DOB: [birth year] Today's Date: 02/09/2018    History of Present Illness Pt is a 47 y.o. 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  MRI positive: Punctate 5 mm focus of diffusion abnormality involving the right paramedian pons, suspicious for a small acute to early subacute   Clinical Impression   PTA patient independent and driving.  Currently admitted for above, presenting with R facial numbness but resolved.  Pt reports history of L sided weakness from prior CVA, but not changed from baseline.  Patient currently at baseline for self care and mobility, demonstrating ability to complete grooming, toileting, transfers and mobility and modified independent level.  Based on performance today, no further OT needs identified.  Thank you for this referral.  OT signing off.     Follow Up Recommendations  No OT follow up    Equipment Recommendations  None recommended by OT    Recommendations for Other Services PT consult     Precautions / Restrictions Restrictions Weight Bearing Restrictions: No      Mobility Bed Mobility Overal bed mobility: Modified Independent             General bed mobility comments: increased time, but independent  Transfers Overall transfer level: Modified independent Equipment used: None             General transfer comment: no assist required, good safety awareness    Balance Overall balance assessment: Mild deficits observed, not formally tested                                         ADL either performed or assessed with  clinical judgement   ADL Overall ADL's : Modified independent;At baseline                                       General ADL Comments: modified independent with dressing, grooming, and toileting/transfers      Vision Baseline Vision/History: No visual deficits Patient Visual Report: No change from baseline Vision Assessment?: Yes Eye Alignment: Within Functional Limits Alignment/Gaze Preference: Within Defined Limits Tracking/Visual Pursuits: Able to track stimulus in all quads without difficulty Visual Fields: No apparent deficits     Perception     Praxis      Pertinent Vitals/Pain Pain Assessment: 0-10 Pain Score: 2  Pain Location: headache Pain Descriptors / Indicators: Discomfort;Headache Pain Intervention(s): Monitored during session     Hand Dominance Left   Extremity/Trunk Assessment Upper Extremity Assessment Upper Extremity Assessment: Overall WFL for tasks assessed(reports L sided weakness at baseline, grossly 4/5 WFL )   Lower Extremity Assessment Lower Extremity Assessment: Defer to PT evaluation   Cervical / Trunk Assessment Cervical / Trunk Assessment: Normal   Communication Communication Communication: No difficulties   Cognition Arousal/Alertness: Awake/alert Behavior During Therapy: WFL for tasks assessed/performed Overall Cognitive Status: Within Functional Limits for tasks assessed  General Comments       Exercises     Shoulder Instructions      Home Living Family/patient expects to be discharged to:: Private residence Living Arrangements: Alone Available Help at Discharge: Family;Friend(s);Available PRN/intermittently Type of Home: Mobile home Home Access: Stairs to enter Entrance Stairs-Number of Steps: 5 Entrance Stairs-Rails: Right Home Layout: One level     Bathroom Shower/Tub: Producer, television/film/videoWalk-in shower   Bathroom Toilet: Standard     Home Equipment: Environmental consultantWalker - 2  wheels;Cane - single point;Crutches;Bedside commode          Prior Functioning/Environment Level of Independence: Independent with assistive device(s)        Comments: uses cane for mobility, +driving, - workingg         OT Problem List: Decreased strength      OT Treatment/Interventions:      OT Goals(Current goals can be found in the care plan section) Acute Rehab OT Goals Patient Stated Goal: home  OT Goal Formulation: With patient  OT Frequency:     Barriers to D/C:            Co-evaluation              AM-PAC OT "6 Clicks" Daily Activity     Outcome Measure Help from another person eating meals?: None Help from another person taking care of personal grooming?: None Help from another person toileting, which includes using toliet, bedpan, or urinal?: None Help from another person bathing (including washing, rinsing, drying)?: None Help from another person to put on and taking off regular upper body clothing?: None Help from another person to put on and taking off regular lower body clothing?: None 6 Click Score: 24   End of Session Nurse Communication: Mobility status  Activity Tolerance: Patient tolerated treatment well Patient left: Other (comment)(with PT )  OT Visit Diagnosis: Unsteadiness on feet (R26.81)                Time: 1005-1017 OT Time Calculation (min): 12 min Charges:  OT General Charges $OT Visit: 1 Visit OT Evaluation $OT Eval Low Complexity: 1 Low  Chancy Milroyhristie S Haden Suder, OT Acute Rehabilitation Services Pager [phone] Office [phone]   Chancy MilroyChristie S Kiauna Zywicki 02/09/2018, 10:23 AM

## 2018-02-10 DIAGNOSIS — E1142 Type 2 diabetes mellitus with diabetic polyneuropathy: Secondary | ICD-10-CM

## 2018-02-10 DIAGNOSIS — I639 Cerebral infarction, unspecified: Secondary | ICD-10-CM

## 2018-02-10 LAB — CBC
HCT: 42.4 % (ref 39.0–52.0)
Hemoglobin: 13.1 g/dL (ref 13.0–17.0)
MCH: 27.9 pg (ref 26.0–34.0)
MCHC: 30.9 g/dL (ref 30.0–36.0)
MCV: 90.2 fL (ref 80.0–100.0)
Platelets: 323 10*3/uL (ref 150–400)
RBC: 4.7 MIL/uL (ref 4.22–5.81)
RDW: 13.7 % (ref 11.5–15.5)
WBC: 8.8 10*3/uL (ref 4.0–10.5)
nRBC: 0 % (ref 0.0–0.2)

## 2018-02-10 LAB — GLUCOSE, CAPILLARY
GLUCOSE-CAPILLARY: 186 mg/dL — AB (ref 70–99)
Glucose-Capillary: 146 mg/dL — ABNORMAL HIGH (ref 70–99)
Glucose-Capillary: 143 mg/dL — ABNORMAL HIGH (ref 70–99)
Glucose-Capillary: 170 mg/dL — ABNORMAL HIGH (ref 70–99)

## 2018-02-10 LAB — BASIC METABOLIC PANEL
Anion gap: 10 (ref 5–15)
BUN: 16 mg/dL (ref 6–20)
CO2: 25 mmol/L (ref 22–32)
Calcium: 9.2 mg/dL (ref 8.9–10.3)
Chloride: 104 mmol/L (ref 98–111)
Creatinine, Ser: 1.13 mg/dL (ref 0.61–1.24)
GFR calc Af Amer: >60 mL/min (ref >60)
GFR calc non Af Amer: >60 mL/min (ref >60)
Glucose, Bld: 142 mg/dL — ABNORMAL HIGH (ref 70–99)
Potassium: 4.1 mmol/L (ref 3.5–5.1)
Sodium: 139 mmol/L (ref 135–145)

## 2018-02-10 LAB — RAPID URINE DRUG SCREEN, HOSP PERFORMED
Amphetamines: NOT DETECTED
Barbiturates: NOT DETECTED
Benzodiazepines: NOT DETECTED
Cocaine: NOT DETECTED
Opiates: NOT DETECTED
Tetrahydrocannabinol: NOT DETECTED

## 2018-02-10 LAB — URINALYSIS, ROUTINE W REFLEX MICROSCOPIC
Bilirubin Urine: NEGATIVE
Glucose, UA: NEGATIVE mg/dL
Hgb urine dipstick: NEGATIVE
Ketones, ur: NEGATIVE mg/dL
Leukocytes, UA: NEGATIVE
NITRITE: NEGATIVE
Protein, ur: NEGATIVE mg/dL
Specific Gravity, Urine: 1.02 (ref 1.005–1.030)
pH: 5 (ref 5.0–8.0)

## 2018-02-10 NOTE — Progress Notes (Signed)
PROGRESS NOTE    Devin [name]   UJW:[id number]  DOB: 06-18-1970  DOA: 02/08/2018 PCP: [facility], [facility]   Brief Narrative:  Devin [name] is a 47 y.o. 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    Symptoms developed approximately 30 minutes prior to arrival in the ED. Not a candidate for TPA d/t recent stroke ( in past 3 months)  MRI brain> 5 mm punctate infarct right paramedian pons.   Subjective: No complaints today.     Assessment & Plan:   Principal Problem:   Acute cerebrovascular accident (CVA) with prior infarcts and Vertebrobasilar stent  - appreciate neuro management - 2D Echo - EF 35 - 40%. No cardiac source of emboli identified - plan> ASA 81 mg daily and Brillinta 90 mg BID which he has been on as outpt - on Zetia and Lopid as well - encourage to stop smoking, lose weight and maintain compliance with treatment  OSA? - maybe causing above CVAs - checking sleep study - he has screened + for OSA in SMART trial last night- neuro recommends one more night to complete the trial and assess CPAP mask tolerance     Hypothyroidism - Synthroid    Type 2 diabetes mellitus with neuropathy - SSI - Amaryl, Janumet on hold - cont Gabapentin    Chronic combined systolic and diastolic CHF (congestive heart failure)  - compensated  Tobacco abuse - needs cessation  Obesity Body mass index is 31.16 kg/m.    DVT prophylaxis: Lovenox Code Status: Full code Family Communication:  Disposition Plan: home tomorrow Consultants:   neuro Procedures:   ECHO Antimicrobials:  Anti-infectives (From admission, onward)   None        Objective: Vitals:   02/09/18 2325 02/10/18 0325 02/10/18 0724 02/10/18 1624  BP: 115/67 112/67 (!) 115/59 106/64  Pulse: 78 64 (!) 58 64  Resp: (!) 22 18 20 18   Temp: 98.2 F (36.8 C) 98.7 F (37.1 C) 98.4 F (36.9 C) 98 F (36.7 C)  TempSrc: Oral Oral Oral Oral  SpO2: 95% 96% 92% 94%  Weight:      Height:       No intake or output data in the 24 hours ending 02/10/18 1659 Filed Weights   02/09/18 0000  Weight: 122.3 kg    Examination: General exam: Appears comfortable  HEENT: PERRLA, oral mucosa moist, no sclera icterus or thrush Respiratory system: Clear to auscultation. Respiratory effort normal. Cardiovascular system: S1 & S2 heard, RRR.   Gastrointestinal system: Abdomen soft, non-tender, nondistended. Normal bowel sounds. Central nervous system: Alert and oriented. No focal neurological deficits. Extremities: No cyanosis, clubbing or edema Skin: No rashes or ulcers Psychiatry:  Mood & affect appropriate.     Data Reviewed: I have personally reviewed following labs and imaging studies  CBC: Recent Labs  Lab 02/08/18 2104 02/08/18 2107 02/10/18 0555  WBC 9.9  --  8.8  NEUTROABS 4.6  --   --   HGB 13.6 14.3 13.1  HCT 42.9 42.0 42.4  MCV 89.6  --  90.2  PLT 352  --  323   Basic Metabolic Panel: Recent Labs  Lab 02/08/18 2104 02/08/18 2107 02/10/18 0555  NA 139 140 139  K 3.8 3.9  4.1  CL 104 105 104  CO2 24  --  25  GLUCOSE 114* 109* 142*  BUN 18 20 16   CREATININE 1.21 1.30* 1.13  CALCIUM 9.6  --  9.2   GFR: Estimated Creatinine Clearance: 119.9 mL/min (by C-G formula based on SCr of 1.13 mg/dL). Liver Function Tests: Recent Labs  Lab 02/08/18 2104  AST 16  ALT 15  ALKPHOS 63  BILITOT 0.4  PROT 8.6*  ALBUMIN 4.5   No results for input(s): LIPASE, AMYLASE in the last 168 hours. No results for input(s): AMMONIA in the last 168 hours. Coagulation Profile: Recent Labs  Lab 02/08/18 2104  INR 0.98   Cardiac Enzymes: No  results for input(s): CKTOTAL, CKMB, CKMBINDEX, TROPONINI in the last 168 hours. BNP (last 3 results) No results for input(s): PROBNP in the last 8760 hours. HbA1C: No results for input(s): HGBA1C in the last 72 hours. CBG: Recent Labs  Lab 02/09/18 1605 02/09/18 2117 02/10/18 0558 02/10/18 1141 02/10/18 1615  GLUCAP 117* 240* 146* 143* 170*   Lipid Profile: No results for input(s): CHOL, HDL, LDLCALC, TRIG, CHOLHDL, LDLDIRECT in the last 72 hours. Thyroid Function Tests: No results for input(s): TSH, T4TOTAL, FREET4, T3FREE, THYROIDAB in the last 72 hours. Anemia Panel: No results for input(s): VITAMINB12, FOLATE, FERRITIN, TIBC, IRON, RETICCTPCT in the last 72 hours. Urine analysis:    Component Value Date/Time   COLORURINE YELLOW 01/05/2018 1652   APPEARANCEUR CLEAR 01/05/2018 1652   LABSPEC >1.046 (H) 01/05/2018 1652   PHURINE 6.0 01/05/2018 1652   GLUCOSEU NEGATIVE 01/05/2018 1652   HGBUR NEGATIVE 01/05/2018 1652   BILIRUBINUR NEGATIVE 01/05/2018 1652   KETONESUR NEGATIVE 01/05/2018 1652   PROTEINUR NEGATIVE 01/05/2018 1652   UROBILINOGEN 0.2 10/06/2013 0305   NITRITE NEGATIVE 01/05/2018 1652   LEUKOCYTESUR NEGATIVE 01/05/2018 1652   Sepsis Labs: @LABRCNTIP (procalcitonin:4,lacticidven:4) )No results found for this or any previous visit (from the past 240 hour(s)).       Radiology Studies: Ct Angio Head W Or Wo Contrast  Result Date: 02/08/2018 CLINICAL DATA:  Initial evaluation for acute right facial numbness. EXAM: CT ANGIOGRAPHY HEAD AND NECK CT PERFUSION BRAIN TECHNIQUE: Multidetector CT imaging of the head and neck was performed using the standard protocol during bolus administration of intravenous contrast. Multiplanar CT image reconstructions and MIPs were obtained to evaluate the vascular anatomy. Carotid stenosis measurements (when applicable) are obtained utilizing NASCET criteria, using the distal internal carotid diameter as the denominator. Multiphase  CT imaging of the brain was performed following IV bolus contrast injection. Subsequent parametric perfusion maps were calculated using RAPID software. CONTRAST:  ISOVUE-370 IOPAMIDOL (ISOVUE-370) INJECTION 76% COMPARISON:  Prior CT from earlier the same day. Comparison also made with prior CTA from 11/30/2017 and arteriogram from 12/03/2017 FINDINGS: CTA NECK FINDINGS Aortic arch: Visualized aortic arch of normal caliber with normal 3 vessel morphology. No flow-limiting stenosis about the origin of the great vessels. Visualized subclavian arteries widely patent. Right carotid system: Right common carotid artery patent from its origin to the bifurcation without hemodynamically significant stenosis. Concentric mixed plaque about the right bifurcation with associated mild stenosis of up to 35% by NASCET criteria. Right ICA otherwise widely patent to the skull base without stenosis, dissection, or occlusion. Left carotid system: Left common carotid artery patent from its origin to the bifurcation without stenosis. Mixed but predominantly soft plaque at the left bifurcation/proximal left ICA with associated stenosis of up to 60% by NASCET criteria (series 8, image 148). Left ICA widely  patent distally to the skull base without additional stenosis, dissection, or occlusion. Vertebral arteries: Both of the vertebral arteries arise from the subclavian arteries. Mild narrowing at the origin of the left vertebral artery. Vertebral arteries otherwise widely patent within the neck without stenosis, dissection, or occlusion. Skeleton: No acute osseous abnormality. No discrete lytic or blastic osseous lesions. 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 Poor dentition with prominent dental carie at the left first mandibular molar. Other neck: No other acute soft tissue abnormality within the neck. Upper chest: Visualized upper chest demonstrates no acute finding. Partially visualized lungs are grossly clear.  Mild upper lobe predominant paraseptal emphysema. Review of the MIP images confirms the above findings CTA HEAD FINDINGS Anterior circulation: Internal carotid arteries widely patent to the termini without hemodynamically significant stenosis. ICA termini well perfused and symmetric. A1 segments widely patent bilaterally. Normal anterior communicating artery. Anterior cerebral arteries widely patent to their distal aspects without stenosis. No M1 stenosis or occlusion. Normal MCA bifurcations. Distal MCA branches well perfused and symmetric. Posterior circulation: Vertebral arteries are co dominant and widely patent as they course into the cranial vault. Severe near occlusive stenosis at the distal left V4 segment just prior to the vertebrobasilar junction (series 7, image 151). Right PICA patent proximally. Moderate multifocal atheromatous irregularity within the proximal left V4 segment with up to moderate approximate 50% stenosis. Vascular stent in place distally within the left V4 segment, extending distally to the proximal-mid basilar artery (series 7, image 149-proximal aspect, series 7 image 128-distal aspect). Possible stenoses at the stent margins difficult to evaluate given adjacent stent markers. There is wide patency of the stent through the distal left vertebral artery and proximal basilar artery, markedly improved relative to prior CTA. Attenuated but patent flow within the proximal-mid basilar artery distal to the takeoff of the anterior inferior cerebellar arteries, with approximate moderate diffuse narrowing (series 8, image 123 coronal, series 7, image 133 axial). Basilar moderately narrowed just distal to the stent by approximately 50-60%, likely similar to previous CTA (series 7, image 124). Superior cerebellar arteries irregular but patent at their origins. Both of the PCAs appear to be primarily supplied via the basilar. Extensive atheromatous irregularity with short-segment moderate to severe  proximal P1 stenoses (series 8, image 115 on the left, image 116 on the right). PCAs are patent distally but attenuated with extensive atheromatous irregularity. Venous sinuses: Patent. Anatomic variants: None significant. Delayed phase: No pathologic enhancement. Chronic left cerebellar infarcts noted. Review of the MIP images confirms the above findings CT Brain Perfusion Findings: CBF (<30%) Volume: 0mL Perfusion (Tmax>6.0s) volume: 11mL Mismatch Volume: 11mL Infarction Location:Negative CT perfusion for core infarct. Patchy perfusion deficit at the mid cerebellar region, likely related to right V4 stenosis and possibly delayed flow through the stent. IMPRESSION: 1. Negative CTA for large vessel occlusion. 2. No evidence for core infarct by CT perfusion. Mild perfusion deficit within the posterior fossa likely related to bilateral V4 stenoses. 3. 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 Widely patent flow through the proximal and mid aspect of the stent. Attenuated flow with up to approximate 50% narrowing through the distal aspect of the stent, distal to the anterior inferior cerebral arteries. Basilar artery is patent distally, with additional moderate to advanced atheromatous irregularity involving the SCAs and PCAs bilaterally. 4. 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 5. Mild 35% atheromatous stenosis at the origin of the right ICA. 6. Widely  patent anterior circulation within the head. Critical Value/emergent results were called by telephone at the time of interpretation on 02/08/2018 at 11:00 pm to Dr. Kennis Carina , who verbally acknowledged these results. Electronically Signed   By: Rise Mu M.D.   On: 02/08/2018 23:33   Ct Angio Neck W And/or Wo Contrast  Result Date: 02/08/2018 CLINICAL DATA:  Initial evaluation for acute right facial numbness. EXAM: CT ANGIOGRAPHY HEAD AND NECK CT PERFUSION BRAIN TECHNIQUE:  Multidetector CT imaging of the head and neck was performed using the standard protocol during bolus administration of intravenous contrast. Multiplanar CT image reconstructions and MIPs were obtained to evaluate the vascular anatomy. Carotid stenosis measurements (when applicable) are obtained utilizing NASCET criteria, using the distal internal carotid diameter as the denominator. Multiphase CT imaging of the brain was performed following IV bolus contrast injection. Subsequent parametric perfusion maps were calculated using RAPID software. CONTRAST:  ISOVUE-370 IOPAMIDOL (ISOVUE-370) INJECTION 76% COMPARISON:  Prior CT from earlier the same day. Comparison also made with prior CTA from 11/30/2017 and arteriogram from 12/03/2017 FINDINGS: CTA NECK FINDINGS Aortic arch: Visualized aortic arch of normal caliber with normal 3 vessel morphology. No flow-limiting stenosis about the origin of the great vessels. Visualized subclavian arteries widely patent. Right carotid system: Right common carotid artery patent from its origin to the bifurcation without hemodynamically significant stenosis. Concentric mixed plaque about the right bifurcation with associated mild stenosis of up to 35% by NASCET criteria. Right ICA otherwise widely patent to the skull base without stenosis, dissection, or occlusion. Left carotid system: Left common carotid artery patent from its origin to the bifurcation without stenosis. Mixed but predominantly soft plaque at the left bifurcation/proximal left ICA with associated stenosis of up to 60% by NASCET criteria (series 8, image 148). Left ICA widely patent distally to the skull base without additional stenosis, dissection, or occlusion. Vertebral arteries: Both of the vertebral arteries arise from the subclavian arteries. Mild narrowing at the origin of the left vertebral artery. Vertebral arteries otherwise widely patent within the neck without stenosis, dissection, or occlusion.  Skeleton: No acute osseous abnormality. No discrete lytic or blastic osseous lesions. 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 Poor dentition with prominent dental carie at the left first mandibular molar. Other neck: No other acute soft tissue abnormality within the neck. Upper chest: Visualized upper chest demonstrates no acute finding. Partially visualized lungs are grossly clear. Mild upper lobe predominant paraseptal emphysema. Review of the MIP images confirms the above findings CTA HEAD FINDINGS Anterior circulation: Internal carotid arteries widely patent to the termini without hemodynamically significant stenosis. ICA termini well perfused and symmetric. A1 segments widely patent bilaterally. Normal anterior communicating artery. Anterior cerebral arteries widely patent to their distal aspects without stenosis. No M1 stenosis or occlusion. Normal MCA bifurcations. Distal MCA branches well perfused and symmetric. Posterior circulation: Vertebral arteries are co dominant and widely patent as they course into the cranial vault. Severe near occlusive stenosis at the distal left V4 segment just prior to the vertebrobasilar junction (series 7, image 151). Right PICA patent proximally. Moderate multifocal atheromatous irregularity within the proximal left V4 segment with up to moderate approximate 50% stenosis. Vascular stent in place distally within the left V4 segment, extending distally to the proximal-mid basilar artery (series 7, image 149-proximal aspect, series 7 image 128-distal aspect). Possible stenoses at the stent margins difficult to evaluate given adjacent stent markers. There is wide patency of the stent through the distal left vertebral artery and  proximal basilar artery, markedly improved relative to prior CTA. Attenuated but patent flow within the proximal-mid basilar artery distal to the takeoff of the anterior inferior cerebellar arteries, with approximate moderate diffuse  narrowing (series 8, image 123 coronal, series 7, image 133 axial). Basilar moderately narrowed just distal to the stent by approximately 50-60%, likely similar to previous CTA (series 7, image 124). Superior cerebellar arteries irregular but patent at their origins. Both of the PCAs appear to be primarily supplied via the basilar. Extensive atheromatous irregularity with short-segment moderate to severe proximal P1 stenoses (series 8, image 115 on the left, image 116 on the right). PCAs are patent distally but attenuated with extensive atheromatous irregularity. Venous sinuses: Patent. Anatomic variants: None significant. Delayed phase: No pathologic enhancement. Chronic left cerebellar infarcts noted. Review of the MIP images confirms the above findings CT Brain Perfusion Findings: CBF (<30%) Volume: 0mL Perfusion (Tmax>6.0s) volume: 11mL Mismatch Volume: 11mL Infarction Location:Negative CT perfusion for core infarct. Patchy perfusion deficit at the mid cerebellar region, likely related to right V4 stenosis and possibly delayed flow through the stent. IMPRESSION: 1. Negative CTA for large vessel occlusion. 2. No evidence for core infarct by CT perfusion. Mild perfusion deficit within the posterior fossa likely related to bilateral V4 stenoses. 3. 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 Widely patent flow through the proximal and mid aspect of the stent. Attenuated flow with up to approximate 50% narrowing through the distal aspect of the stent, distal to the anterior inferior cerebral arteries. Basilar artery is patent distally, with additional moderate to advanced atheromatous irregularity involving the SCAs and PCAs bilaterally. 4. 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 5. Mild 35% atheromatous stenosis at the origin of the right ICA. 6. Widely patent anterior circulation within the head. Critical Value/emergent results were called by  telephone at the time of interpretation on 02/08/2018 at 11:00 pm to Dr. Kennis Carina , who verbally acknowledged these results. Electronically Signed   By: Rise Mu M.D.   On: 02/08/2018 23:33   Mr Brain Wo Contrast  Result Date: 02/09/2018 CLINICAL DATA:  Initial evaluation for acute headache with right-sided facial numbness. EXAM: MRI HEAD WITHOUT CONTRAST TECHNIQUE: Multiplanar, multiecho pulse sequences of the brain and surrounding structures were obtained without intravenous contrast. COMPARISON:  Prior CT and CTA from 02/08/2018 FINDINGS: Brain: Generalized age-related cerebral atrophy. Mild chronic microvascular ischemic disease noted involving the pons. Chronic left cerebellar infarct. Single 5 mm focus of diffusion abnormality seen within the right paramedian pons (series 5, image 66), suspicious for a small acute to early subacute ischemic infarct. No associated hemorrhage. No other evidence for acute or subacute ischemia. Gray-white matter differentiation otherwise maintained. No other areas of chronic cortical infarction. No acute intracranial hemorrhage. Small amount of chronic hemosiderin staining noted about the chronic left cerebellar infarct. No mass lesion, midline shift or mass effect. No hydrocephalus. No extra-axial fluid collection. Pituitary gland within normal limits. 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 Major intracranial vascular flow voids otherwise maintained. Skull and upper cervical spine: Craniocervical junction within normal limits. Bone marrow signal intensity normal. No scalp soft tissue abnormality. Sinuses/Orbits: Globes and orbital soft tissues within normal limits. Scattered mucosal thickening and opacity within the ethmoidal air cells. Paranasal sinuses are otherwise clear. No significant mastoid effusion. Inner ear structures grossly normal.  Other: None. IMPRESSION:  1. Punctate 5 mm focus of diffusion abnormality involving the right paramedian pons, suspicious for a small acute to early subacute small vessel type infarct. No associated hemorrhage. 2. No other acute intracranial abnormality. 3. Small chronic left cerebellar infarct. Electronically Signed   By: Rise Mu M.D.   On: 02/09/2018 05:44   Ct Cerebral Perfusion W Contrast  Result Date: 02/08/2018 CLINICAL DATA:  Initial evaluation for acute right facial numbness. EXAM: CT ANGIOGRAPHY HEAD AND NECK CT PERFUSION BRAIN TECHNIQUE: Multidetector CT imaging of the head and neck was performed using the standard protocol during bolus administration of intravenous contrast. Multiplanar CT image reconstructions and MIPs were obtained to evaluate the vascular anatomy. Carotid stenosis measurements (when applicable) are obtained utilizing NASCET criteria, using the distal internal carotid diameter as the denominator. Multiphase CT imaging of the brain was performed following IV bolus contrast injection. Subsequent parametric perfusion maps were calculated using RAPID software. CONTRAST:  ISOVUE-370 IOPAMIDOL (ISOVUE-370) INJECTION 76% COMPARISON:  Prior CT from earlier the same day. Comparison also made with prior CTA from 11/30/2017 and arteriogram from 12/03/2017 FINDINGS: CTA NECK FINDINGS Aortic arch: Visualized aortic arch of normal caliber with normal 3 vessel morphology. No flow-limiting stenosis about the origin of the great vessels. Visualized subclavian arteries widely patent. Right carotid system: Right common carotid artery patent from its origin to the bifurcation without hemodynamically significant stenosis. Concentric mixed plaque about the right bifurcation with associated mild stenosis of up to 35% by NASCET criteria. Right ICA otherwise widely patent to the skull base without stenosis, dissection, or occlusion. Left carotid system: Left common carotid artery patent  from its origin to the bifurcation without stenosis. Mixed but predominantly soft plaque at the left bifurcation/proximal left ICA with associated stenosis of up to 60% by NASCET criteria (series 8, image 148). Left ICA widely patent distally to the skull base without additional stenosis, dissection, or occlusion. Vertebral arteries: Both of the vertebral arteries arise from the subclavian arteries. Mild narrowing at the origin of the left vertebral artery. Vertebral arteries otherwise widely patent within the neck without stenosis, dissection, or occlusion. Skeleton: No acute osseous abnormality. No discrete lytic or blastic osseous lesions. 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 Poor dentition with prominent dental carie at the left first mandibular molar. Other neck: No other acute soft tissue abnormality within the neck. Upper chest: Visualized upper chest demonstrates no acute finding. Partially visualized lungs are grossly clear. Mild upper lobe predominant paraseptal emphysema. Review of the MIP images confirms the above findings CTA HEAD FINDINGS Anterior circulation: Internal carotid arteries widely patent to the termini without hemodynamically significant stenosis. ICA termini well perfused and symmetric. A1 segments widely patent bilaterally. Normal anterior communicating artery. Anterior cerebral arteries widely patent to their distal aspects without stenosis. No M1 stenosis or occlusion. Normal MCA bifurcations. Distal MCA branches well perfused and symmetric. Posterior circulation: Vertebral arteries are co dominant and widely patent as they course into the cranial vault. Severe near occlusive stenosis at the distal left V4 segment just prior to the vertebrobasilar junction (series 7, image 151). Right PICA patent proximally. Moderate multifocal atheromatous irregularity within the proximal left V4 segment with up to moderate approximate 50% stenosis. Vascular stent in place distally  within the left V4 segment, extending distally to the proximal-mid basilar artery (series 7, image 149-proximal aspect, series 7 image 128-distal aspect). Possible stenoses at the stent margins difficult to evaluate given adjacent stent markers. There is wide patency of the stent through the  distal left vertebral artery and proximal basilar artery, markedly improved relative to prior CTA. Attenuated but patent flow within the proximal-mid basilar artery distal to the takeoff of the anterior inferior cerebellar arteries, with approximate moderate diffuse narrowing (series 8, image 123 coronal, series 7, image 133 axial). Basilar moderately narrowed just distal to the stent by approximately 50-60%, likely similar to previous CTA (series 7, image 124). Superior cerebellar arteries irregular but patent at their origins. Both of the PCAs appear to be primarily supplied via the basilar. Extensive atheromatous irregularity with short-segment moderate to severe proximal P1 stenoses (series 8, image 115 on the left, image 116 on the right). PCAs are patent distally but attenuated with extensive atheromatous irregularity. Venous sinuses: Patent. Anatomic variants: None significant. Delayed phase: No pathologic enhancement. Chronic left cerebellar infarcts noted. Review of the MIP images confirms the above findings CT Brain Perfusion Findings: CBF (<30%) Volume: 0mL Perfusion (Tmax>6.0s) volume: 11mL Mismatch Volume: 11mL Infarction Location:Negative CT perfusion for core infarct. Patchy perfusion deficit at the mid cerebellar region, likely related to right V4 stenosis and possibly delayed flow through the stent. IMPRESSION: 1. Negative CTA for large vessel occlusion. 2. No evidence for core infarct by CT perfusion. Mild perfusion deficit within the posterior fossa likely related to bilateral V4 stenoses. 3. 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 Widely patent flow  through the proximal and mid aspect of the stent. Attenuated flow with up to approximate 50% narrowing through the distal aspect of the stent, distal to the anterior inferior cerebral arteries. Basilar artery is patent distally, with additional moderate to advanced atheromatous irregularity involving the SCAs and PCAs bilaterally. 4. 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 5. Mild 35% atheromatous stenosis at the origin of the right ICA. 6. Widely patent anterior circulation within the head. Critical Value/emergent results were called by telephone at the time of interpretation on 02/08/2018 at 11:00 pm to Dr. Kennis CarinaMICHAEL BERO , who verbally acknowledged these results. Electronically Signed   By: Rise MuBenjamin  McClintock M.D.   On: 02/08/2018 23:33   Ct Head Code Stroke Wo Contrast  Result Date: 02/08/2018 CLINICAL DATA:  Code stroke.  Right-sided facial numbness EXAM: CT HEAD WITHOUT CONTRAST TECHNIQUE: Contiguous axial images were obtained from the base of the skull through the vertex without intravenous contrast. COMPARISON:  None. FINDINGS: Brain: There is no mass, hemorrhage or extra-axial collection. The size and configuration of the ventricles and extra-axial CSF spaces are normal. There is hypoattenuation of the periventricular white matter, most commonly indicating chronic ischemic microangiopathy. Vascular: No abnormal hyperdensity of the major intracranial arteries or dural venous sinuses. No intracranial atherosclerosis. Skull: Right parietal scalp hematoma.  No skull fracture. Sinuses/Orbits: No fluid levels or advanced mucosal thickening of the visualized paranasal sinuses. No mastoid or middle ear effusion. The orbits are normal. ASPECTS [facility]([place] Stroke Program Early CT Score) - Ganglionic level infarction (caudate, lentiform nuclei, internal capsule, insula, M1-M3 cortex): 7 - Supraganglionic infarction (M4-M6 cortex): 3 Total score (0-10 with 10 being normal): 10 IMPRESSION: 1. No acute  intracranial abnormality. 2. ASPECTS is 10. 3. Right parietal scalp hematoma without skull fracture. * These results were called by telephone at the time of interpretation on 02/08/2018 at 8:58 pm to Dr. Kennis CarinaMICHAEL BERO , who verbally acknowledged these results. Electronically Signed   By: Deatra RobinsonKevin  Herman M.D.   On: 02/08/2018 20:59      Scheduled Meds: . aspirin EC  81  mg Oral Daily  . citalopram  10 mg Oral Daily  . enoxaparin (LOVENOX) injection  40 mg Subcutaneous Q24H  . ezetimibe  10 mg Oral Daily  . gabapentin  300 mg Oral BID  . insulin aspart  0-5 Units Subcutaneous QHS  . insulin aspart  0-9 Units Subcutaneous TID WC  . levothyroxine  175 mcg Oral QAC breakfast  . pantoprazole  40 mg Oral q morning - 10a  . sodium chloride flush  3 mL Intravenous Q12H  . ticagrelor  90 mg Oral BID   Continuous Infusions: . sodium chloride       LOS: 1 day    Time spent in minutes: 35    Calvert Cantor, MD [facility] Pager: [email or web address] Password [email or web address] 02/10/2018, 4:59 PM

## 2018-02-10 NOTE — Evaluation (Signed)
Speech Language Pathology Evaluation Patient Details Name: Varney DailyCarl L Foglesong MRN: [id number] DOB: 1970/10/30 Today's Date: 02/10/2018 Time: [id number] SLP Time Calculation (min) (ACUTE ONLY): 15 min  Problem List:  Patient Active Problem List   Diagnosis Date Noted  . Acute focal neurological deficit 02/08/2018  . Chronic combined systolic and diastolic CHF (congestive heart failure) (HCC) 01/06/2018  . Altered mental status 01/05/2018  . History of stroke 01/05/2018  . 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 Cerebellar stroke (HCC)   . TIA (transient ischemic attack) 11/30/2017  . Abnormal cardiac CT angiography   . Coronary artery disease   . Chest pain due to CAD (HCC) 11/14/2017  . Chest pain with moderate risk for cardiac etiology   . CKD (chronic kidney disease) 11/13/2017  . Hypothyroidism 10/07/2013  . Type 2 diabetes mellitus without complications (HCC) 10/07/2013  . Acute renal failure (HCC) 10/06/2013  . Elevated AST (SGOT) 10/06/2013  . Elevated ALT measurement 10/06/2013  . Chest pain   . Tobacco abuse   . Hypertension   . Hyperlipidemia   . Pericardial effusion   . History of rhabdomyolysis    Past Medical History:  Past Medical History:  Diagnosis Date  . Asthma   . Chest pain   . Coronary artery disease    a. s/p DES x2 to LAD and DES to OM in 11/2017  . Dyspnea   . GERD (gastroesophageal reflux disease)   . History of kidney stones   . History of rhabdomyolysis   . Hyperlipidemia   . Hypertension   . Hypothyroidism   . Ischemic cardiomyopathy    a. 11/2017: echo showing EF of 35-40%, diffuse HK, and Grade 2 DD  . Kidney calculus 2014  . Pericardial effusion    Small, by dobutamine echocardiogram, 04/2006  . Stroke (HCC)   . Tobacco abuse   . Type 2 diabetes mellitus without complications (HCC) 10/07/2013  . Vitamin D deficiency    Past Surgical History:  Past Surgical History:  Procedure Laterality Date  . CARDIAC CATHETERIZATION  09/2012    "Nonobstructive CAD with 30% proximal, 40% mid LAD disease; 30% proximal CFX; EF 55-65%"  . CHOLECYSTECTOMY N/A 01/27/2013   Procedure: LAPAROSCOPIC CHOLECYSTECTOMY;  Surgeon: Dalia HeadingMark A Jenkins, MD;  Location: [facility];  Service: General;  Laterality: N/A;  . CORONARY STENT INTERVENTION N/A 11/15/2017   Procedure: CORONARY STENT INTERVENTION;  Surgeon: Marykay LexHarding, David W, MD;  [facility];  Service: Cardiovascular;  Laterality: N/A;  . HERNIA REPAIR     As a child  . INSERTION OF MESH N/A 11/13/2012   Procedure: INSERTION OF MESH;  Surgeon: Dalia HeadingMark A Jenkins, MD;  Location: [facility];  Service: General;  Laterality: N/A;  . IR ANGIO INTRA EXTRACRAN SEL COM CAROTID INNOMINATE BILAT MOD SED  12/01/2017  . IR ANGIO VERTEBRAL SEL VERTEBRAL BILAT MOD SED  12/01/2017  . IR INTRA CRAN STENT  12/03/2017  . KIDNEY STONE SURGERY    . LEFT HEART CATH AND CORONARY ANGIOGRAPHY N/A 11/15/2017   Procedure: LEFT HEART CATH AND CORONARY ANGIOGRAPHY;  Surgeon: Marykay LexHarding, David W, MD;  [facility];  Service: Cardiovascular;  Laterality: N/A;  . PERCUTANEOUS NEPHROLITHOTRIPSY    . RADIOLOGY WITH ANESTHESIA Left 12/03/2017   Procedure: Angioplasty with possible stenting of left VBJ;  Surgeon: Julieanne Cottoneveshwar, Sanjeev, MD;  Location: [facility] OR;  Service: Radiology;  Laterality: Left;  . UMBILICAL HERNIA REPAIR N/A 11/13/2012   Procedure: UMBILICAL HERNIORRHAPHY;  Surgeon: Dalia HeadingMark A Jenkins,  MD;  Location: [facility];  Service: General;  Laterality: N/A;  . VARICOCELECTOMY     HPI:  Pt is a 47 y.o. 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  MRI positive: Punctate 5 mm focus of diffusion abnormality involving the  right paramedian pons, suspicious for a small acute to early subacute   Assessment / Plan / Recommendation Clinical Impression  Pt presents with fluent speech; no dysarthria; expressive/receptive language are intact; cognition is [facility], as well as reading/writing.  Reviewed BE-FASt acronym with pt, who recalled some of the elements after review by PT.  No SLP f/u is needed.     SLP Assessment  SLP Recommendation/Assessment: Patient does not need any further Speech Lanaguage Pathology Services SLP Visit Diagnosis: Cognitive communication deficit (R41.841)    Follow Up Recommendations  None    Frequency and Duration           SLP Evaluation Cognition  Overall Cognitive Status: Within Functional Limits for tasks assessed Arousal/Alertness: Awake/alert Orientation Level: Oriented X4 Attention: Selective Selective Attention: Appears intact Awareness: Appears intact Problem Solving: Appears intact       Comprehension  Auditory Comprehension Overall Auditory Comprehension: Appears within functional limits for tasks assessed Visual Recognition/Discrimination Discrimination: Within Function Limits Reading Comprehension Reading Status: Within funtional limits    Expression Expression Primary Mode of Expression: Verbal Verbal Expression Overall Verbal Expression: Appears within functional limits for tasks assessed Written Expression Dominant Hand: Left Written Expression: Within Functional Limits   Oral / Motor  Oral Motor/Sensory Function Overall Oral Motor/Sensory Function: Within functional limits Motor Speech Overall Motor Speech: Appears within functional limits for tasks assessed   GO                    Blenda Mounts Laurice 02/10/2018, 11:34 AM

## 2018-02-10 NOTE — Progress Notes (Signed)
STROKE TEAM PROGRESS NOTE   HISTORY OF PRESENT ILLNESS (per record) Devin [name] is an 47 y.o. male  With PMH CAD ( stent), CHF, HTN, DM, hypothyroidism, multiple posterior circulation strokes ( vertebrobasilar artery stenting) presented to [place] for right facial numbness.   Per patient: 11/29 patient presented to [facility] for sudden onset of right facial numbness. Patient seen by teleneurology and was not a candidate for TPA d/t recent stroke ( in past 3 months). Denies any vision changes, hearing changes, CP, SOB, or any weakness or numbness in extremities. Endorses smoking 5-7 cigarettes per day which is decreased from 2 packs per day. Denies missing any doses of medication. Patient on  ASA and brilinta.   Hospital course:  02/08/18: CT head:no hemorrhage, CTA: no LVO 11/30: punctate 5 mm right paramedian pons, small chronic left cerebellar infarct  Chart review of past stroke:  10/28: Multiple small posterior circulation embolic infarcts in L pons and L occipital lobe 11/2017: left cerebellar infarct   Date last known well: Date: 02/08/2018 Time last known well: Time: 15:00 tPA Given: No: outside of window  Modified Rankin: Rankin Score=0 NIHSS:0   SUBJECTIVE (INTERVAL HISTORY) No family members present.  The patient feels he is back to baseline.  He plans to participate in the sleep smart stroke prevention study trial.    OBJECTIVE Vitals:   02/09/18 1600 02/09/18 1923 02/09/18 2325 02/10/18 0325  BP: 137/88 125/64 115/67 112/67  Pulse:  (!) 56 78 64  Resp:   (!) 22 18  Temp: (!) 97.4 F (36.3 C) 97.9 F (36.6 C) 98.2 F (36.8 C) 98.7 F (37.1 C)  TempSrc: Oral Oral Oral Oral  SpO2:   95% 96%  Weight:      Height:        CBC:  Recent Labs  Lab 02/08/18 2104 02/08/18 2107 02/10/18 0555  WBC 9.9  --  8.8  NEUTROABS 4.6  --   --   HGB 13.6 14.3 13.1  HCT 42.9 42.0 42.4  MCV 89.6  --  90.2  PLT 352  --  323    Basic Metabolic Panel:  Recent  Labs  Lab 02/08/18 2104 02/08/18 2107 02/10/18 0555  NA 139 140 139  K 3.8 3.9 4.1  CL 104 105 104  CO2 24  --  25  GLUCOSE 114* 109* 142*  BUN 18 20 16   CREATININE 1.21 1.30* 1.13  CALCIUM 9.6  --  9.2    Lipid Panel:     Component Value Date/Time   CHOL 260 (H) 01/06/2018 0556   TRIG 793 (H) 01/06/2018 0556   HDL 25 (L) 01/06/2018 0556   CHOLHDL 10.4 01/06/2018 0556   VLDL UNABLE TO CALCULATE IF TRIGLYCERIDE OVER 400 mg/dL 40/98/1191 4782   LDLCALC UNABLE TO CALCULATE IF TRIGLYCERIDE OVER 400 mg/dL 95/62/1308 6578   IONG2X:  Lab Results  Component Value Date   HGBA1C 7.9 (H) 01/06/2018   Urine Drug Screen:     Component Value Date/Time   LABOPIA NONE DETECTED 01/05/2018 1652   COCAINSCRNUR NONE DETECTED 01/05/2018 1652   LABBENZ NONE DETECTED 01/05/2018 1652   AMPHETMU NONE DETECTED 01/05/2018 1652   THCU NONE DETECTED 01/05/2018 1652   LABBARB NONE DETECTED 01/05/2018 1652    Alcohol Level     Component Value Date/Time   ETH <10 02/08/2018 2104    IMAGING  Ct Angio Head W Or Wo Contrast Ct Angio Neck W And/or Wo Contrast Ct Cerebral Perfusion W  Contrast 02/08/2018 IMPRESSION:  1. Negative CTA for large vessel occlusion.  2. No evidence for core infarct by CT perfusion. Mild perfusion deficit within the posterior fossa likely related to bilateral V4 stenoses.  3. 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 Widely patent flow through the proximal and mid aspect of the stent. Attenuated flow with up to approximate 50% narrowing through the distal aspect of the stent, distal to the anterior inferior cerebral arteries. Basilar artery is patent distally, with additional moderate to advanced atheromatous irregularity involving the SCAs and PCAs bilaterally.  4. 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  5. Mild 35% atheromatous stenosis at the origin of the right ICA.  6. Widely patent anterior  circulation within the head.    Mr Brain Wo Contrast 02/09/2018 IMPRESSION:  1. Punctate 5 mm focus of diffusion abnormality involving the right paramedian pons, suspicious for a small acute to early subacute small vessel type infarct. No associated hemorrhage.  2. No other acute intracranial abnormality.  3. Small chronic left cerebellar infarct.    Ct Head Code Stroke Wo Contrast 02/08/2018 IMPRESSION:  1. No acute intracranial abnormality.  2. ASPECTS is 10.  3. Right parietal scalp hematoma without skull fracture.     Transthoracic Echocardiogram  02/09/2018 Study Conclusions - Left ventricle: The cavity size was normal. There was mild   concentric hypertrophy. Systolic function was moderately reduced.   The estimated ejection fraction was in the range of 35% to 40%.   Diffuse hypokinesis. Doppler parameters are consistent with   abnormal left ventricular relaxation (grade 1 diastolic   dysfunction). - Mitral valve: There was mild regurgitation. - Pericardium, extracardiac: A trivial pericardial effusion was   identified along the right ventricular free wall. Impressions: - Compared to the prior study, there has been no significant   interval change.     PHYSICAL EXAM Blood pressure 112/67, pulse 64, temperature 98.7 F (37.1 C), temperature source Oral, resp. rate 18, height 6\' 6"  (1.981 m), weight 122.3 kg, SpO2 96 %. Pleasant middle-aged Caucasian obese male not in distress. . Afebrile. Head is nontraumatic. Neck is supple without bruit.    Cardiac exam no murmur or gallop. Lungs are clear to auscultation. Distal pulses are well felt.  Neurological Exam ;  Awake  Alert oriented x 3. Normal speech and language.eye movements full without nystagmus.fundi were not visualized. Vision acuity and fields appear normal. Hearing is normal. Palatal movements are normal. Face symmetric. Tongue midline. Normal strength, tone, reflexes and coordination. Normal sensation. Gait  deferred. NIHSS 0 Premorbid MRS 0    ASSESSMENT/PLAN Devin [name] is a 47 y.o. 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 He did not receive IV t-PA due to late presentation and recent stroke.  Stroke:  right paramedian pons infarct - small vessel disease  Resultant  Subjective paresthesias but no objective deficits  CT head - No acute intracranial abnormality.   MRI head - Punctate 5 mm focus of diffusion abnormality involving the right paramedian pons. Small chronic left cerebellar infarct.   MRA head - not performed  CTA H&N - Vertebrobasilar stent in place extending from the left V4 segment through the mid basilar artery. Widely patent flow through the proximal and mid aspect of the stent. Attenuated flow with up to approximate 50% narrowing through  the distal aspect of the stent. 60% Lt ICA stenosis.  Carotid Doppler - CTA neck performed - carotid dopplers not indicated.  2D Echo - EF 35 - 40%. No cardiac source of emboli identified.   LDL - not performed  HgbA1c - not performed  UDS - pending  VTE prophylaxis - Lovenox  Diet - Heart healthy / carb modified with thin liquids.  aspirin 81 mg daily and Brillinta 90 mg twice daily prior to admission, now on aspirin 81 mg daily and Brillinta 90 mg twice daily  Patient counseled to be compliant with his antithrombotic medications  Ongoing aggressive stroke risk factor management  Therapy recommendations:  No F/U recommended  Disposition:  Pending  Hypertension  Stable . Permissive hypertension (OK if < 220/120) but gradually normalize in 5-7 days . Long-term BP goal normotensive  Hyperlipidemia  Lipid lowering medication PTA:  Zetia 10 mg daily  LDL not performed, goal < 70  Current lipid lowering medication: Zetia 10 mg daily  Pt intolerant to  statins  Diabetes  HgbA1c not performed, goal < 7.0  Unc / Controlled  Other Stroke Risk Factors  Cigarette smoker - advised to stop smoking  Obesity, Body mass index is 31.16 kg/m., recommend weight loss, diet and exercise as appropriate   Hx stroke/TIA  Family hx stroke (aunt and uncle)  Coronary artery disease   Other Active Problems  R/O OSA -> sleep study   Devin NethDavid Rinehuls PA-C [facility] Pager [phone] 02/10/2018, 12:17 PM I have personally examined this patient, reviewed notes, independently viewed imaging studies, participated in medical decision making and plan of care.ROS completed by me personally and pertinent positives fully documented  I have made any additions or clarifications directly to the above note. Agree with note above.  He presented with sudden onset of facial paresthesias from   small brainstem infarct etiology indeterminate whether related to small vessel disease, atrial fibrillation not being on anticoagulation or terminal right vertebral artery occlusion.patient was counseled to quit smoking completely, eat healthy diet, lose weight and to be compliant with his antiplatelet therapy and maintain aggressive risk factor modification. He also appears to be at risk for sleep apnea and may benefit with possible participation in the sleep smart stroke prevention study. [facility] and tested positive. He will have CPAP mask tolerance study tonight. Discussed with patient and Dr. Butler Denmarkizwan. Greater than 50% time during this 35 minute visit was spent on counseling and coordination of care about his stroke, discussion about risk factors, stroke prevention and treatment and answering questions.  Delia HeadyPramod Caral Whan, MD Medical Director [facility] Pager: [phone] 02/10/2018 12:58 PM    Hospital day # 1     To contact Stroke Continuity provider, please refer to [email or web address]. After hours,  contact General Neurology

## 2018-02-10 NOTE — Progress Notes (Signed)
P.t. Has not voided this shift. Urinalysis still pending.

## 2018-02-11 ENCOUNTER — Encounter (HOSPITAL_COMMUNITY): Payer: Self-pay | Admitting: *Deleted

## 2018-02-11 LAB — LIPID PANEL
CHOL/HDL RATIO: 8.4 ratio
CHOLESTEROL: 211 mg/dL — AB (ref 0–200)
HDL: 25 mg/dL — ABNORMAL LOW (ref >40)
LDL Cholesterol: 108 mg/dL — ABNORMAL HIGH (ref 0–99)
Triglycerides: 391 mg/dL — ABNORMAL HIGH (ref <150)
VLDL: 78 mg/dL — ABNORMAL HIGH (ref 0–40)

## 2018-02-11 LAB — HEMOGLOBIN A1C
Hgb A1c MFr Bld: 7.2 % — ABNORMAL HIGH (ref 4.8–5.6)
Mean Plasma Glucose: 159.94 mg/dL

## 2018-02-11 LAB — GLUCOSE, CAPILLARY
Glucose-Capillary: 115 mg/dL — ABNORMAL HIGH (ref 70–99)
Glucose-Capillary: 124 mg/dL — ABNORMAL HIGH (ref 70–99)
Glucose-Capillary: 192 mg/dL — ABNORMAL HIGH (ref 70–99)
Glucose-Capillary: 139 mg/dL — ABNORMAL HIGH (ref 70–99)
Glucose-Capillary: 204 mg/dL — ABNORMAL HIGH (ref 70–99)

## 2018-02-11 LAB — PLATELET INHIBITION P2Y12: Platelet Function  P2Y12: 23 [PRU] — ABNORMAL LOW (ref 194–418)

## 2018-02-11 MED ORDER — SODIUM CHLORIDE 0.9 % IV SOLN: 250.0000 mL | INTRAVENOUS | Status: DC | PRN

## 2018-02-11 MED ORDER — FENOFIBRATE 160 MG PO TABS
160.0000 mg | ORAL_TABLET | Freq: Every day | ORAL | Status: DC
  Administered 2018-02-11 – 2018-02-12 (×2): 160 mg via ORAL
  Filled 2018-02-11 (×2): qty 1

## 2018-02-11 NOTE — Progress Notes (Signed)
Spoke with Dr Sharolyn DouglasEzenduka about pt wanting to remove tele to take a shower,  Dr Sharolyn DouglasEzenduka told me to d/c the tele and allow the pt to take a shower.  Pt made aware to be NPO after midnight for his procedure in the am.   Pt verbalizes understanding of the procedure and why NPO status will be implemented

## 2018-02-11 NOTE — Progress Notes (Signed)
STROKE TEAM PROGRESS NOTE   SUBJECTIVE (INTERVAL HISTORY) No family is at bedside.  The patient feels he is back to baseline.  He agrees to have cerebral angiogram in a.m. to evaluate basal artery stent.  He is still not quit smoking yet but greatly cut down from prior.  OBJECTIVE Vitals:   02/11/18 0007 02/11/18 0408 02/11/18 0808 02/11/18 1121  BP: 120/62 112/61 119/74 (!) 123/56  Pulse: 67 66 60 80  Resp: 17 19 19 18   Temp: 97.6 F (36.4 C) 98.1 F (36.7 C) 98.3 F (36.8 C) 98.2 F (36.8 C)  TempSrc: Oral Oral Oral Oral  SpO2: 95% 92% 94% 97%  Weight:      Height:        CBC:  Recent Labs  Lab 02/08/18 2104 02/08/18 2107 02/10/18 0555  WBC 9.9  --  8.8  NEUTROABS 4.6  --   --   HGB 13.6 14.3 13.1  HCT 42.9 42.0 42.4  MCV 89.6  --  90.2  PLT 352  --  323    Basic Metabolic Panel:  Recent Labs  Lab 02/08/18 2104 02/08/18 2107 02/10/18 0555  NA 139 140 139  K 3.8 3.9 4.1  CL 104 105 104  CO2 24  --  25  GLUCOSE 114* 109* 142*  BUN 18 20 16   CREATININE 1.21 1.30* 1.13  CALCIUM 9.6  --  9.2    Lipid Panel:     Component Value Date/Time   CHOL 211 (H) 02/11/2018 0615   TRIG 391 (H) 02/11/2018 0615   HDL 25 (L) 02/11/2018 0615   CHOLHDL 8.4 02/11/2018 0615   VLDL 78 (H) 02/11/2018 0615   LDLCALC 108 (H) 02/11/2018 0615   HgbA1c:  Lab Results  Component Value Date   HGBA1C 7.2 (H) 02/11/2018   Urine Drug Screen:     Component Value Date/Time   LABOPIA NONE DETECTED 02/10/2018 1929   COCAINSCRNUR NONE DETECTED 02/10/2018 1929   LABBENZ NONE DETECTED 02/10/2018 1929   AMPHETMU NONE DETECTED 02/10/2018 1929   THCU NONE DETECTED 02/10/2018 1929   LABBARB NONE DETECTED 02/10/2018 1929    Alcohol Level     Component Value Date/Time   ETH <10 02/08/2018 2104    IMAGING  Ct Angio Head W Or Wo Contrast Ct Angio Neck W And/or Wo Contrast Ct Cerebral Perfusion W Contrast 02/08/2018 IMPRESSION:  1. Negative CTA for large vessel occlusion.  2.  No evidence for core infarct by CT perfusion. Mild perfusion deficit within the posterior fossa likely related to bilateral V4 stenoses.  3. 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 Widely patent flow through the proximal and mid aspect of the stent. Attenuated flow with up to approximate 50% narrowing through the distal aspect of the stent, distal to the anterior inferior cerebral arteries. Basilar artery is patent distally, with additional moderate to advanced atheromatous irregularity involving the SCAs and PCAs bilaterally.  4. 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  5. Mild 35% atheromatous stenosis at the origin of the right ICA.  6. Widely patent anterior circulation within the head.    Mr Brain Wo Contrast 02/09/2018 IMPRESSION:  1. Punctate 5 mm focus of diffusion abnormality involving the right paramedian pons, suspicious for a small acute to early subacute small vessel type infarct. No associated hemorrhage.  2. No other acute intracranial abnormality.  3. Small chronic left cerebellar infarct.    Ct Head  Code Stroke Wo Contrast 02/08/2018 IMPRESSION:  1. No acute intracranial abnormality.  2. ASPECTS is 10.  3. Right parietal scalp hematoma without skull fracture.     Transthoracic Echocardiogram  02/09/2018 Study Conclusions - Left ventricle: The cavity size was normal. There was mild   concentric hypertrophy. Systolic function was moderately reduced.   The estimated ejection fraction was in the range of 35% to 40%.   Diffuse hypokinesis. Doppler parameters are consistent with   abnormal left ventricular relaxation (grade 1 diastolic   dysfunction). - Mitral valve: There was mild regurgitation. - Pericardium, extracardiac: A trivial pericardial effusion was   identified along the right ventricular free wall. Impressions: - Compared to the prior study, there has been no significant   interval  change.     PHYSICAL EXAM Blood pressure (!) 123/56, pulse 80, temperature 98.2 F (36.8 C), temperature source Oral, resp. rate 18, height 6\' 6"  (1.981 m), weight 122.3 kg, SpO2 97 %.   Pleasant middle-aged Caucasian obese male not in distress. . Afebrile. Head is nontraumatic. Neck is supple without bruit.    Cardiac exam no murmur or gallop. Lungs are clear to auscultation. Distal pulses are well felt.  Neurological Exam ;  Awake  Alert oriented x 3. Normal speech and language.eye movements full without nystagmus.fundi were not visualized. Vision acuity and fields appear normal. Hearing is normal. Palatal movements are normal. Face symmetric. Tongue midline. Normal strength, tone, reflexes and coordination. Normal sensation. Gait deferred.   ASSESSMENT/PLAN [name] is a 47 y.o. 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 He did not receive IV t-PA due to late presentation and recent stroke.  Stroke:  right paramedian pons infarct - small vessel disease, need to rule out basilar artery stent restenosis  Resultant back to baseline  CT head - No acute intracranial abnormality.   MRI head - Punctate 5 mm focus of diffusion abnormality involving the right paramedian pons. Small chronic left cerebellar infarct.   CTA H&N - Vertebrobasilar stent in place extending from the left V4 segment through the mid basilar artery. Widely patent flow through the proximal and mid aspect of the stent. Attenuated flow with up to approximate 50% narrowing through the distal aspect of the stent. 60% Lt ICA stenosis.  Cerebral angiogram pending  2D Echo - EF 35 - 40%. No cardiac source of emboli identified.   LDL - 108  HgbA1c - 7.2  P2Y12 = 23  UDS negative  VTE prophylaxis - Lovenox  Diet - Heart healthy / carb modified with thin  liquids.  aspirin 81 mg daily and Brillinta 90 mg twice daily prior to admission, now on aspirin 81 mg daily and Brillinta 90 mg twice daily.  Continue on discharge  Patient counseled to be compliant with his antithrombotic medications  Ongoing aggressive stroke risk factor management  Therapy recommendations:  None  Disposition:  Pending  History of stroke  11/2017 presented with left arm numbness and chest pain.  CT showed left cerebellum old infarct.  MRI showed no acute infarct.  CTA head and neck basilar artery and bilateral vertebral artery near occlusion.  EF 35 to 40%.  LDL 205 and A1c 8.5.  Status post basal artery and vertebral artery stenting 12/03/2017.  Discharged on aspirin Brilinta and Zetia.  12/2017 patient readmitted for headache, dizziness and arm shaking.  CTA head and neck  unchanged from prior, still showing right V4 occlusion, and bilateral stent patent.  EEG negative.  A1c 7.9, TG 793.  P2Y12 = 46.  Continued on aspirin Brilinta and Zetia on discharge.  Hypotension and a history of hypertension  Stable, on the low side  Not on BP meds  Encourage p.o. intake . Long-term BP goal 130-150 given posterior circulation stenosis.  Hyperlipidemia  Lipid lowering medication PTA:  Zetia 10 mg daily  LDL 108, goal < 70  TG 391  Current lipid lowering medication: Zetia 10 mg daily  Will add fenofibrate 160 mg daily  Pt intolerant to statins  Continue Zetia and fenofibrate on discharge  Diabetes  HgbA1c 7.2, goal < 7.0  Uncontrolled  Improved from prior  SSI  CBG monitoring  Close PCP follow-up  Tobacco abuse  Current smoker  Has cut down dramatically from prior  Smoking cessation counseling provided  Pt is willing to quit  Other Stroke Risk Factors  Obesity, Body mass index is 31.16 kg/m., recommend weight loss, diet and exercise as appropriate   Family hx stroke (aunt and uncle)  Coronary artery disease status post stent  11/2017   Other Active Problems    Hospital day # 2   Marvel Plan, MD PhD Stroke Neurology 02/11/2018 5:33 PM   To contact Stroke Continuity provider, please refer to [email or web address]. After hours, contact General Neurology

## 2018-02-11 NOTE — Progress Notes (Signed)
PROGRESS NOTE    Devin DailyCarl L [name]   BMW:[id number]  DOB: Jul 18, 1970  DOA: 02/08/2018 [facility]   Brief Narrative:  Devin [name] is a 47 y.o. 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 Symptoms developed approximately 30 minutes prior to arrival in the ED. Not a candidate for TPA d/t recent stroke ( in past 3 months). MRI brain> 5 mm punctate infarct right paramedian pons. Pt admitted for further management.  Subjective: Denies any new complaints, reports resolved facial numbness.  Requesting to have a shower.    Assessment & Plan: Acute cerebrovascular accident (CVA) with prior infarcts and Vertebrobasilar stent  2D Echo - EF 35 - 40%. No cardiac source of emboli identified Neurology on board: Continue home ASA 81 mg daily, Brillinta 90 mg BID, zetia. Plan for image-guided diagnostic cerebral angiogram by IR on 02/12/18 Monitor closely  ??OSA ?maybe causing above CVAs Checking sleep study - he has screened + for OSA in SMART trial last night- neuro on board, to assess for CPAP mask tolerance   Hypothyroidism Synthroid  Type 2 diabetes mellitus with neuropathy SSI - Amaryl, Janumet on hold Cont Gabapentin  Chronic combined systolic and diastolic CHF (congestive heart failure)  Appears compensated  Tobacco abuse Advised to quit  Obesity Body mass index is 31.16 kg/m.    DVT prophylaxis: Lovenox, on hold for procedure in am  Code Status: Full code  Family Communication: None at bedside   Disposition Plan: TBD pending workup  Consultants:   Neuro  IR  Procedures:   Sleep study   Antimicrobials:    Anti-infectives (From admission, onward)   None       Objective: Vitals:   02/11/18 0007 02/11/18 0408 02/11/18 0808 02/11/18 1121  BP: 120/62 112/61 119/74 (!) 123/56  Pulse: 67 66 60 80  Resp: 17 19 19 18   Temp: 97.6 F (36.4 C) 98.1 F (36.7 C) 98.3 F (36.8 C) 98.2 F (36.8 C)  TempSrc: Oral Oral Oral Oral  SpO2: 95% 92% 94% 97%  Weight:      Height:        Intake/Output Summary (Last 24 hours) at 02/11/2018 1656 Last data filed at 02/11/2018 1412 Gross per 24 hour  Intake 591 ml  Output 1125 ml  Net -534 ml   Filed Weights   02/09/18 0000  Weight: 122.3 kg    Examination:  General: NAD   Cardiovascular: S1, S2 present  Respiratory: CTAB  Abdomen: Soft, nontender, nondistended, bowel sounds present  Musculoskeletal: No bilateral pedal edema noted  Skin: Normal  Psychiatry: Normal mood  Neurology: No focal neurologic deficit noted   Data Reviewed: I have personally reviewed following labs and imaging studies  CBC: Recent Labs  Lab 02/08/18 2104 02/08/18 2107 02/10/18 0555  WBC 9.9  --  8.8  NEUTROABS 4.6  --   --   HGB 13.6 14.3 13.1  HCT 42.9 42.0 42.4  MCV 89.6  --  90.2  PLT 352  --  323   Basic Metabolic Panel: Recent Labs  Lab 02/08/18 2104 02/08/18 2107 02/10/18 0555  NA 139 140 139  K 3.8 3.9 4.1  CL 104 105 104  CO2 24  --  25  GLUCOSE 114* 109*  142*  BUN 18 20 16   CREATININE 1.21 1.30* 1.13  CALCIUM 9.6  --  9.2   GFR: Estimated Creatinine Clearance: 119.9 mL/min (by C-G formula based on SCr of 1.13 mg/dL). Liver Function Tests: Recent Labs  Lab 02/08/18 2104  AST 16  ALT 15  ALKPHOS 63  BILITOT 0.4  PROT 8.6*  ALBUMIN 4.5   No results for input(s): LIPASE, AMYLASE in the last 168 hours. No results for input(s): AMMONIA in the last 168 hours. Coagulation Profile: Recent Labs  Lab 02/08/18 2104  INR 0.98   Cardiac Enzymes: No results for input(s): CKTOTAL, CKMB, CKMBINDEX, TROPONINI in the last 168  hours. BNP (last 3 results) No results for input(s): PROBNP in the last 8760 hours. HbA1C: Recent Labs    02/11/18 0615  HGBA1C 7.2*   CBG: Recent Labs  Lab 02/10/18 1141 02/10/18 1615 02/10/18 2130 02/11/18 0618 02/11/18 1120  GLUCAP 143* 170* 186* 124* 192*   Lipid Profile: Recent Labs    02/11/18 0615  CHOL 211*  HDL 25*  LDLCALC 108*  TRIG 391*  CHOLHDL 8.4   Thyroid Function Tests: No results for input(s): TSH, T4TOTAL, FREET4, T3FREE, THYROIDAB in the last 72 hours. Anemia Panel: No results for input(s): VITAMINB12, FOLATE, FERRITIN, TIBC, IRON, RETICCTPCT in the last 72 hours. Urine analysis:    Component Value Date/Time   COLORURINE YELLOW 02/10/2018 1929   APPEARANCEUR CLEAR 02/10/2018 1929   LABSPEC 1.020 02/10/2018 1929   PHURINE 5.0 02/10/2018 1929   GLUCOSEU NEGATIVE 02/10/2018 1929   HGBUR NEGATIVE 02/10/2018 1929   BILIRUBINUR NEGATIVE 02/10/2018 1929   KETONESUR NEGATIVE 02/10/2018 1929   PROTEINUR NEGATIVE 02/10/2018 1929   UROBILINOGEN 0.2 10/06/2013 0305   NITRITE NEGATIVE 02/10/2018 1929   LEUKOCYTESUR NEGATIVE 02/10/2018 1929   Sepsis Labs: @LABRCNTIP (procalcitonin:4,lacticidven:4) )No results found for this or any previous visit (from the past 240 hour(s)).       Radiology Studies: No results found.    Scheduled Meds: . aspirin EC  81 mg Oral Daily  . citalopram  10 mg Oral Daily  . ezetimibe  10 mg Oral Daily  . gabapentin  300 mg Oral BID  . insulin aspart  0-5 Units Subcutaneous QHS  . insulin aspart  0-9 Units Subcutaneous TID WC  . levothyroxine  175 mcg Oral QAC breakfast  . pantoprazole  40 mg Oral q morning - 10a  . sodium chloride flush  3 mL Intravenous Q12H  . ticagrelor  90 mg Oral BID   Continuous Infusions: . [START ON 02/12/2018] sodium chloride       LOS: 2 days    Time spent in minutes: 35    Briant Cedar, MD [facility] 02/11/2018, 4:56 PM

## 2018-02-11 NOTE — Plan of Care (Signed)
  Problem: Education: Goal: Knowledge of General Education information will improve Description Including pain rating scale, medication(s)/side effects and non-pharmacologic comfort measures Outcome: Progressing   Problem: Education: Goal: Knowledge of General Education information will improve Description Including pain rating scale, medication(s)/side effects and non-pharmacologic comfort measures Outcome: Progressing   Problem: Clinical Measurements: Goal: Ability to maintain clinical measurements within normal limits will improve Outcome: Progressing   Problem: Coping: Goal: Level of anxiety will decrease Outcome: Progressing

## 2018-02-11 NOTE — Consult Note (Addendum)
Chief Complaint: Patient was seen in consultation today for pontine stroke.  Referring Physician(s): Rosalin Hawking  Supervising Physician: Luanne Bras  Patient Status: [facility] - In-pt  History of Present Illness: Devin [name] is a 47 y.o. male with a past medical history of past medical history of hypertension, hyperlipidemia, collagen vascular disease, pericardial effusion 04/2006, CAD, CVA 11/2017, asthma, GERD, nephrolithiasis, renal insufficiency, hypothyroidism, diabetes mellitus type II, vitamin D deficiency, sickle cell anemia, multiple myeloma, rhabdomyolysis, and tobacco abuse. He is known to [facility] and has been followed by Dr. [name] since 11/2017. He first presented as a code stroke in 11/2017. He underwent an image-guided diagnostic cerebral angiogram 12/01/2017 with Dr. 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 He then underwent an image-guided cerebral angiogram with revascularization of his vertebrobasilar junction/proximal basilar artery stenosis using stent assisted angioplasty 12/03/2017 by Dr. [name]. He was discharged home 12/05/2017 in stable condition. He was doing well post-procedure, until 02/08/2018 when he presented to [facility] ED with complaint of acute onset of right-sided facial numbness. He was found to have an acute ischemic pontine stroke, and was transferred/admitted to [facility] for management.  CT head 02/08/2018: 1. No acute intracranial abnormality. 2. ASPECTS is 10. 3. Right parietal scalp hematoma without skull fracture.  CTA head/neck and CT cerebral perfusion 02/08/2018: 1. Negative CTA for large vessel occlusion. 2. No evidence for core infarct by CT perfusion. Mild perfusion deficit within the posterior fossa likely related to bilateral V4 stenoses. 3. 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 Widely patent flow through the proximal and mid  aspect of the stent. Attenuated flow with up to approximate 50% narrowing through the distal aspect of the stent, distal to the anterior inferior cerebral arteries. Basilar artery is patent distally, with additional moderate to advanced atheromatous irregularity involving the SCAs and PCAs bilaterally. 4. 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 5. Mild 35% atheromatous stenosis at the origin of the right ICA. 6. Widely patent anterior circulation within the head.  MRI brain 02/09/2018: 1. Punctate 5 mm focus of diffusion abnormality involving the right paramedian pons, suspicious for a small acute to early subacute small vessel type infarct. No associated hemorrhage. 2. No other acute intracranial abnormality. 3. Small chronic left cerebellar infarct.  IR requested by Dr. Erlinda Hong for possible image-guided diagnostic cerebral angiogram. Patient awake and alert laying in bed with no complaints at this time. Denies fever, chills, chest pain, dyspnea, abdominal pain, dizziness, or headache.  Patient is currently taking Brilinta 90 mg twice daily and Aspirin 81 mg once daily.   Past Medical History:  Diagnosis Date  . Asthma   . Chest pain   . Coronary artery disease    a. s/p DES x2 to LAD and DES to OM in 11/2017  . Dyspnea   . GERD (gastroesophageal reflux disease)   . History of kidney stones   . History of rhabdomyolysis   . Hyperlipidemia   . Hypertension   . Hypothyroidism   . Ischemic cardiomyopathy    a. 11/2017: echo showing EF of 35-40%, diffuse HK, and Grade 2 DD  . Kidney calculus 2014  . Pericardial effusion    Small, by dobutamine echocardiogram, 04/2006  . Stroke ([place])   . Tobacco abuse   . Type 2 diabetes mellitus without complications ([place]) 3/47/4259  . Vitamin D deficiency     Past Surgical History:  Procedure Laterality Date  .  CARDIAC CATHETERIZATION  09/2012   "Nonobstructive CAD with 30% proximal, 40% mid LAD disease; 30% proximal CFX; EF  55-65%"  . CHOLECYSTECTOMY N/A 01/27/2013   Procedure: LAPAROSCOPIC CHOLECYSTECTOMY;  Surgeon: Jamesetta So, MD;  Location: [facility];  Service: General;  Laterality: N/A;  . CORONARY STENT INTERVENTION N/A 11/15/2017   Procedure: CORONARY STENT INTERVENTION;  Surgeon: Leonie Man, MD;  Location: [place] [facility];  Service: Cardiovascular;  Laterality: N/A;  . HERNIA REPAIR     As a child  . INSERTION OF MESH N/A 11/13/2012   Procedure: INSERTION OF MESH;  Surgeon: Jamesetta So, MD;  Location: [facility];  Service: General;  Laterality: N/A;  . IR ANGIO INTRA EXTRACRAN SEL COM CAROTID INNOMINATE BILAT MOD SED  12/01/2017  . IR ANGIO VERTEBRAL SEL VERTEBRAL BILAT MOD SED  12/01/2017  . IR INTRA CRAN STENT  12/03/2017  . KIDNEY STONE SURGERY    . LEFT HEART CATH AND CORONARY ANGIOGRAPHY N/A 11/15/2017   Procedure: LEFT HEART CATH AND CORONARY ANGIOGRAPHY;  Surgeon: Leonie Man, MD;  Location: [place] [facility];  Service: Cardiovascular;  Laterality: N/A;  . PERCUTANEOUS NEPHROLITHOTRIPSY    . RADIOLOGY WITH ANESTHESIA Left 12/03/2017   Procedure: Angioplasty with possible stenting of left VBJ;  Surgeon: Luanne Bras, MD;  Location: [place];  Service: Radiology;  Laterality: Left;  . UMBILICAL HERNIA REPAIR N/A 11/13/2012   Procedure: UMBILICAL HERNIORRHAPHY;  Surgeon: Jamesetta So, MD;  Location: [facility];  Service: General;  Laterality: N/A;  . VARICOCELECTOMY      Allergies: Bee venom and Crestor [rosuvastatin]  Medications: Prior to Admission medications   Medication Sig Start Date End Date Taking? Authorizing Provider  aspirin 81 MG EC tablet Take 1 tablet (81 mg total) by mouth daily. With food 12/05/17  Yes Roxan Hockey, MD  Blood Glucose Monitoring Suppl w/Device KIT Dispense based on patient and insurance preference. Check blood sugar daily before breakfast (ICD9 250.0) 04/29/17  Yes Pollina, Gwenyth Allegra, MD  citalopram (CELEXA) 10 MG tablet Take 1 tablet (10 mg total) by  mouth daily. 01/04/18  Yes Venancio Poisson, NP  ezetimibe (ZETIA) 10 MG tablet Take 1 tablet (10 mg total) by mouth daily. 12/06/17  Yes Emokpae, Courage, MD  gabapentin (NEURONTIN) 300 MG capsule Take 1 capsule (300 mg total) by mouth 2 (two) times daily. 01/04/18  Yes Venancio Poisson, NP  glimepiride (AMARYL) 2 MG tablet Take 1 tablet (2 mg total) by mouth daily with breakfast. 11/16/17  Yes Patrecia Pour, MD  levothyroxine (SYNTHROID, LEVOTHROID) 175 MCG tablet Take 175 mcg by mouth daily before breakfast.   Yes [provider]  losartan (COZAAR) 25 MG tablet Take 0.5 tablets (12.5 mg total) by mouth daily. 01/15/18  Yes Strader, [place] M, PA-C  nitroGLYCERIN (NITROSTAT) 0.4 MG SL tablet Place 1 tablet (0.4 mg total) under the tongue every 5 (five) minutes as needed for chest pain. 01/15/18  Yes Strader, Hardy, PA-C  pantoprazole (PROTONIX) 40 MG tablet Take 40 mg by mouth every morning.   Yes [provider]  sitaGLIPtin-metformin (JANUMET) 50-1000 MG tablet Take 1 tablet by mouth 2 (two) times daily with a meal.   Yes [provider]  ticagrelor (BRILINTA) 90 MG TABS tablet Take 1 tablet (90 mg total) by mouth 2 (two) times daily. 11/16/17  Yes Patrecia Pour, MD  gemfibrozil (LOPID) 600 MG tablet Take 1 tablet (600 mg total) by mouth 2 (two) times daily before  a meal. Patient not taking: Reported on 01/15/2018 01/08/18   Elmarie Shiley, MD     Family History  Problem Relation Age of Onset  . Coronary artery disease Father   . Hypertension Father   . Hyperlipidemia Father   . Diabetes Father   . Congestive Heart Failure Father 77  . Arrhythmia Father        had an ICD  . Diabetes Mother   . Hypertension Mother   . Hyperlipidemia Mother   . Obesity Mother 34       died after bariatric surgery, liver failure and infection  . Coronary artery disease Maternal Grandfather        both grandfathers and several uncles  . Coronary artery disease Unknown    . Diabetes Sister        both sisters  . Stroke Maternal Aunt   . Stroke Maternal Uncle     Social History   Socioeconomic History  . Marital status: Divorced    Spouse name: Not on file  . Number of children: Not on file  . Years of education: Not on file  . Highest education level: Not on file  Occupational History    Comment:      Employer: [employer]  Social Needs  . Financial resource strain: Not on file  . Food insecurity:    Worry: Not on file    Inability: Not on file  . Transportation needs:    Medical: Not on file    Non-medical: Not on file  Tobacco Use  . Smoking status: Current Some Day Smoker    Packs/day: 0.50    Years: 26.00    Pack years: 13.00    Types: Cigarettes    Start date: 03/13/1985  . Smokeless tobacco: Former Systems developer    Types: Snuff, Chew    Quit date: 03/14/1987  . Tobacco comment: 8 PER DAY  Substance and Sexual Activity  . Alcohol use: No  . Drug use: No  . Sexual activity: Not on file  Lifestyle  . Physical activity:    Days per week: Not on file    Minutes per session: Not on file  . Stress: Not on file  Relationships  . Social connections:    Talks on phone: Not on file    Gets together: Not on file    Attends religious service: Not on file    Active member of club or organization: Not on file    Attends meetings of clubs or organizations: Not on file    Relationship status: Not on file  Other Topics Concern  . Not on file  Social History Narrative   Eats fast food daily   No exercise outside of work   Lives in [place] with his dog   MAINTENANCE TECHNICIAN     Review of Systems: A 12 point ROS discussed and pertinent positives are indicated in the HPI above.  All other systems are negative.  Review of Systems  Constitutional: Negative for chills and fever.  Respiratory: Negative for shortness of breath and wheezing.   Cardiovascular: Negative for chest pain and palpitations.  Gastrointestinal: Negative for abdominal pain.    Neurological: Negative for dizziness and headaches.  Psychiatric/Behavioral: Negative for behavioral problems and confusion.    Vital Signs: BP 119/74 (BP Location: Left Arm)   Pulse 60   Temp 98.3 F (36.8 C) (Oral)   Resp 19   Ht _0  (1.981 m)   Wt 269 lb 10 oz (  122.3 kg)   SpO2 94%   BMI 31.16 kg/m   Physical Exam  Constitutional: He is oriented to person, place, and time. He appears well-developed and well-nourished. No distress.  Cardiovascular: Normal rate, regular rhythm and normal heart sounds.  No murmur heard. Pulmonary/Chest: Effort normal and breath sounds normal. No respiratory distress. He has no wheezes.  Neurological: He is alert and oriented to person, place, and time.  Skin: Skin is warm and dry.  Psychiatric: He has a normal mood and affect. His behavior is normal. Judgment and thought content normal.  Nursing note and vitals reviewed.    MD Evaluation Airway: WNL Heart: WNL Abdomen: WNL Chest/ Lungs: WNL ASA  Classification: 3 Mallampati/Airway Score: Two   Imaging: Ct Angio Head W Or Wo Contrast  Result Date: 02/08/2018 CLINICAL DATA:  Initial evaluation for acute right facial numbness. EXAM: CT ANGIOGRAPHY HEAD AND NECK CT PERFUSION BRAIN TECHNIQUE: Multidetector CT imaging of the head and neck was performed using the standard protocol during bolus administration of intravenous contrast. Multiplanar CT image reconstructions and MIPs were obtained to evaluate the vascular anatomy. Carotid stenosis measurements (when applicable) are obtained utilizing NASCET criteria, using the distal internal carotid diameter as the denominator. Multiphase CT imaging of the brain was performed following IV bolus contrast injection. Subsequent parametric perfusion maps were calculated using RAPID software. CONTRAST:  170m ISOVUE-370 IOPAMIDOL (ISOVUE-370) INJECTION 76% COMPARISON:  Prior CT from earlier the same day. Comparison also made with prior CTA from 11/30/2017  and arteriogram from 12/03/2017 FINDINGS: CTA NECK FINDINGS Aortic arch: Visualized aortic arch of normal caliber with normal 3 vessel morphology. No flow-limiting stenosis about the origin of the great vessels. Visualized subclavian arteries widely patent. Right carotid system: Right common carotid artery patent from its origin to the bifurcation without hemodynamically significant stenosis. Concentric mixed plaque about the right bifurcation with associated mild stenosis of up to 35% by NASCET criteria. Right ICA otherwise widely patent to the skull base without stenosis, dissection, or occlusion. Left carotid system: Left common carotid artery patent from its origin to the bifurcation without stenosis. Mixed but predominantly soft plaque at the left bifurcation/proximal left ICA with associated stenosis of up to 60% by NASCET criteria (series 8, image 148). Left ICA widely patent distally to the skull base without additional stenosis, dissection, or occlusion. Vertebral arteries: Both of the vertebral arteries arise from the subclavian arteries. Mild narrowing at the origin of the left vertebral artery. Vertebral arteries otherwise widely patent within the neck without stenosis, dissection, or occlusion. Skeleton: No acute osseous abnormality. No discrete lytic or blastic osseous lesions. 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 Poor dentition with prominent dental carie at the left first mandibular molar. Other neck: No other acute soft tissue abnormality within the neck. Upper chest: Visualized upper chest demonstrates no acute finding. Partially visualized lungs are grossly clear. Mild upper lobe predominant paraseptal emphysema. Review of the MIP images confirms the above findings CTA HEAD FINDINGS Anterior circulation: Internal carotid arteries widely patent to the termini without hemodynamically significant stenosis. ICA termini well perfused and symmetric. A1 segments widely patent  bilaterally. Normal anterior communicating artery. Anterior cerebral arteries widely patent to their distal aspects without stenosis. No M1 stenosis or occlusion. Normal MCA bifurcations. Distal MCA branches well perfused and symmetric. Posterior circulation: Vertebral arteries are co dominant and widely patent as they course into the cranial vault. Severe near occlusive stenosis at the distal left V4 segment just prior to the vertebrobasilar junction (series  7, image 151). Right PICA patent proximally. Moderate multifocal atheromatous irregularity within the proximal left V4 segment with up to moderate approximate 50% stenosis. Vascular stent in place distally within the left V4 segment, extending distally to the proximal-mid basilar artery (series 7, image 149-proximal aspect, series 7 image 128-distal aspect). Possible stenoses at the stent margins difficult to evaluate given adjacent stent markers. There is wide patency of the stent through the distal left vertebral artery and proximal basilar artery, markedly improved relative to prior CTA. Attenuated but patent flow within the proximal-mid basilar artery distal to the takeoff of the anterior inferior cerebellar arteries, with approximate moderate diffuse narrowing (series 8, image 123 coronal, series 7, image 133 axial). Basilar moderately narrowed just distal to the stent by approximately 50-60%, likely similar to previous CTA (series 7, image 124). Superior cerebellar arteries irregular but patent at their origins. Both of the PCAs appear to be primarily supplied via the basilar. Extensive atheromatous irregularity with short-segment moderate to severe proximal P1 stenoses (series 8, image 115 on the left, image 116 on the right). PCAs are patent distally but attenuated with extensive atheromatous irregularity. Venous sinuses: Patent. Anatomic variants: None significant. Delayed phase: No pathologic enhancement. Chronic left cerebellar infarcts noted. Review  of the MIP images confirms the above findings CT Brain Perfusion Findings: CBF (<30%) Volume: 60m Perfusion (Tmax>6.0s) volume: 129mMismatch Volume: 116mnfarction Location:Negative CT perfusion for core infarct. Patchy perfusion deficit at the mid cerebellar region, likely related to right V4 stenosis and possibly delayed flow through the stent. IMPRESSION: 1. Negative CTA for large vessel occlusion. 2. No evidence for core infarct by CT perfusion. Mild perfusion deficit within the posterior fossa likely related to bilateral V4 stenoses. 3. 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 Widely patent flow through the proximal and mid aspect of the stent. Attenuated flow with up to approximate 50% narrowing through the distal aspect of the stent, distal to the anterior inferior cerebral arteries. Basilar artery is patent distally, with additional moderate to advanced atheromatous irregularity involving the SCAs and PCAs bilaterally. 4. 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 5. Mild 35% atheromatous stenosis at the origin of the right ICA. 6. Widely patent anterior circulation within the head. Critical Value/emergent results were called by telephone at the time of interpretation on 02/08/2018 at 11:00 pm to Dr. MICGerlene Feewho verbally acknowledged these results. Electronically Signed   By: BenJeannine BogaD.   On: 02/08/2018 23:33   Ct Angio Neck W And/or Wo Contrast  Result Date: 02/08/2018 CLINICAL DATA:  Initial evaluation for acute right facial numbness. EXAM: CT ANGIOGRAPHY HEAD AND NECK CT PERFUSION BRAIN TECHNIQUE: Multidetector CT imaging of the head and neck was performed using the standard protocol during bolus administration of intravenous contrast. Multiplanar CT image reconstructions and MIPs were obtained to evaluate the vascular anatomy. Carotid stenosis measurements (when applicable) are obtained utilizing NASCET  criteria, using the distal internal carotid diameter as the denominator. Multiphase CT imaging of the brain was performed following IV bolus contrast injection. Subsequent parametric perfusion maps were calculated using RAPID software. CONTRAST:  125m77mOVUE-370 IOPAMIDOL (ISOVUE-370) INJECTION 76% COMPARISON:  Prior CT from earlier the same day. Comparison also made with prior CTA from 11/30/2017 and arteriogram from 12/03/2017 FINDINGS: CTA NECK FINDINGS Aortic arch: Visualized aortic arch of normal caliber with normal 3 vessel morphology. No flow-limiting stenosis about the origin of the great vessels. Visualized  subclavian arteries widely patent. Right carotid system: Right common carotid artery patent from its origin to the bifurcation without hemodynamically significant stenosis. Concentric mixed plaque about the right bifurcation with associated mild stenosis of up to 35% by NASCET criteria. Right ICA otherwise widely patent to the skull base without stenosis, dissection, or occlusion. Left carotid system: Left common carotid artery patent from its origin to the bifurcation without stenosis. Mixed but predominantly soft plaque at the left bifurcation/proximal left ICA with associated stenosis of up to 60% by NASCET criteria (series 8, image 148). Left ICA widely patent distally to the skull base without additional stenosis, dissection, or occlusion. Vertebral arteries: Both of the vertebral arteries arise from the subclavian arteries. Mild narrowing at the origin of the left vertebral artery. Vertebral arteries otherwise widely patent within the neck without stenosis, dissection, or occlusion. Skeleton: No acute osseous abnormality. No discrete lytic or blastic osseous lesions. 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 Poor dentition with prominent dental carie at the left first mandibular molar. Other neck: No other acute soft tissue abnormality within the neck. Upper chest: Visualized upper  chest demonstrates no acute finding. Partially visualized lungs are grossly clear. Mild upper lobe predominant paraseptal emphysema. Review of the MIP images confirms the above findings CTA HEAD FINDINGS Anterior circulation: Internal carotid arteries widely patent to the termini without hemodynamically significant stenosis. ICA termini well perfused and symmetric. A1 segments widely patent bilaterally. Normal anterior communicating artery. Anterior cerebral arteries widely patent to their distal aspects without stenosis. No M1 stenosis or occlusion. Normal MCA bifurcations. Distal MCA branches well perfused and symmetric. Posterior circulation: Vertebral arteries are co dominant and widely patent as they course into the cranial vault. Severe near occlusive stenosis at the distal left V4 segment just prior to the vertebrobasilar junction (series 7, image 151). Right PICA patent proximally. Moderate multifocal atheromatous irregularity within the proximal left V4 segment with up to moderate approximate 50% stenosis. Vascular stent in place distally within the left V4 segment, extending distally to the proximal-mid basilar artery (series 7, image 149-proximal aspect, series 7 image 128-distal aspect). Possible stenoses at the stent margins difficult to evaluate given adjacent stent markers. There is wide patency of the stent through the distal left vertebral artery and proximal basilar artery, markedly improved relative to prior CTA. Attenuated but patent flow within the proximal-mid basilar artery distal to the takeoff of the anterior inferior cerebellar arteries, with approximate moderate diffuse narrowing (series 8, image 123 coronal, series 7, image 133 axial). Basilar moderately narrowed just distal to the stent by approximately 50-60%, likely similar to previous CTA (series 7, image 124). Superior cerebellar arteries irregular but patent at their origins. Both of the PCAs appear to be primarily supplied via the  basilar. Extensive atheromatous irregularity with short-segment moderate to severe proximal P1 stenoses (series 8, image 115 on the left, image 116 on the right). PCAs are patent distally but attenuated with extensive atheromatous irregularity. Venous sinuses: Patent. Anatomic variants: None significant. Delayed phase: No pathologic enhancement. Chronic left cerebellar infarcts noted. Review of the MIP images confirms the above findings CT Brain Perfusion Findings: CBF (<30%) Volume: 79m Perfusion (Tmax>6.0s) volume: 140mMismatch Volume: 1151mnfarction Location:Negative CT perfusion for core infarct. Patchy perfusion deficit at the mid cerebellar region, likely related to right V4 stenosis and possibly delayed flow through the stent. IMPRESSION: 1. Negative CTA for large vessel occlusion. 2. No evidence for core infarct by CT perfusion. Mild perfusion deficit within the posterior fossa likely  related to bilateral V4 stenoses. 3. 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 Widely patent flow through the proximal and mid aspect of the stent. Attenuated flow with up to approximate 50% narrowing through the distal aspect of the stent, distal to the anterior inferior cerebral arteries. Basilar artery is patent distally, with additional moderate to advanced atheromatous irregularity involving the SCAs and PCAs bilaterally. 4. 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 5. Mild 35% atheromatous stenosis at the origin of the right ICA. 6. Widely patent anterior circulation within the head. Critical Value/emergent results were called by telephone at the time of interpretation on 02/08/2018 at 11:00 pm to Dr. Gerlene Fee , who verbally acknowledged these results. Electronically Signed   By: Jeannine Boga M.D.   On: 02/08/2018 23:33   Mr Brain Wo Contrast  Result Date: 02/09/2018 CLINICAL DATA:  Initial evaluation for acute headache with  right-sided facial numbness. EXAM: MRI HEAD WITHOUT CONTRAST TECHNIQUE: Multiplanar, multiecho pulse sequences of the brain and surrounding structures were obtained without intravenous contrast. COMPARISON:  Prior CT and CTA from 02/08/2018 FINDINGS: Brain: Generalized age-related cerebral atrophy. Mild chronic microvascular ischemic disease noted involving the pons. Chronic left cerebellar infarct. Single 5 mm focus of diffusion abnormality seen within the right paramedian pons (series 5, image 66), suspicious for a small acute to early subacute ischemic infarct. No associated hemorrhage. No other evidence for acute or subacute ischemia. Gray-white matter differentiation otherwise maintained. No other areas of chronic cortical infarction. No acute intracranial hemorrhage. Small amount of chronic hemosiderin staining noted about the chronic left cerebellar infarct. No mass lesion, midline shift or mass effect. No hydrocephalus. No extra-axial fluid collection. Pituitary gland within normal limits. 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 Major intracranial vascular flow voids otherwise maintained. Skull and upper cervical spine: Craniocervical junction within normal limits. Bone marrow signal intensity normal. No scalp soft tissue abnormality. Sinuses/Orbits: Globes and orbital soft tissues within normal limits. Scattered mucosal thickening and opacity within the ethmoidal air cells. Paranasal sinuses are otherwise clear. No significant mastoid effusion. Inner ear structures grossly normal. Other: None. IMPRESSION: 1. Punctate 5 mm focus of diffusion abnormality involving the right paramedian pons, suspicious for a small acute to early subacute small vessel type infarct. No associated hemorrhage. 2. No other acute intracranial abnormality. 3. Small chronic left cerebellar infarct. Electronically Signed    By: Jeannine Boga M.D.   On: 02/09/2018 05:44   Ct Cerebral Perfusion W Contrast  Result Date: 02/08/2018 CLINICAL DATA:  Initial evaluation for acute right facial numbness. EXAM: CT ANGIOGRAPHY HEAD AND NECK CT PERFUSION BRAIN TECHNIQUE: Multidetector CT imaging of the head and neck was performed using the standard protocol during bolus administration of intravenous contrast. Multiplanar CT image reconstructions and MIPs were obtained to evaluate the vascular anatomy. Carotid stenosis measurements (when applicable) are obtained utilizing NASCET criteria, using the distal internal carotid diameter as the denominator. Multiphase CT imaging of the brain was performed following IV bolus contrast injection. Subsequent parametric perfusion maps were calculated using RAPID software. CONTRAST:  115m ISOVUE-370 IOPAMIDOL (ISOVUE-370) INJECTION 76% COMPARISON:  Prior CT from earlier the same day. Comparison also made with prior CTA from 11/30/2017 and arteriogram from 12/03/2017 FINDINGS: CTA NECK FINDINGS Aortic arch: Visualized aortic arch of normal caliber with normal 3 vessel morphology. No flow-limiting stenosis about the origin of  the great vessels. Visualized subclavian arteries widely patent. Right carotid system: Right common carotid artery patent from its origin to the bifurcation without hemodynamically significant stenosis. Concentric mixed plaque about the right bifurcation with associated mild stenosis of up to 35% by NASCET criteria. Right ICA otherwise widely patent to the skull base without stenosis, dissection, or occlusion. Left carotid system: Left common carotid artery patent from its origin to the bifurcation without stenosis. Mixed but predominantly soft plaque at the left bifurcation/proximal left ICA with associated stenosis of up to 60% by NASCET criteria (series 8, image 148). Left ICA widely patent distally to the skull base without additional stenosis, dissection, or occlusion.  Vertebral arteries: Both of the vertebral arteries arise from the subclavian arteries. Mild narrowing at the origin of the left vertebral artery. Vertebral arteries otherwise widely patent within the neck without stenosis, dissection, or occlusion. Skeleton: No acute osseous abnormality. No discrete lytic or blastic osseous lesions. 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 Poor dentition with prominent dental carie at the left first mandibular molar. Other neck: No other acute soft tissue abnormality within the neck. Upper chest: Visualized upper chest demonstrates no acute finding. Partially visualized lungs are grossly clear. Mild upper lobe predominant paraseptal emphysema. Review of the MIP images confirms the above findings CTA HEAD FINDINGS Anterior circulation: Internal carotid arteries widely patent to the termini without hemodynamically significant stenosis. ICA termini well perfused and symmetric. A1 segments widely patent bilaterally. Normal anterior communicating artery. Anterior cerebral arteries widely patent to their distal aspects without stenosis. No M1 stenosis or occlusion. Normal MCA bifurcations. Distal MCA branches well perfused and symmetric. Posterior circulation: Vertebral arteries are co dominant and widely patent as they course into the cranial vault. Severe near occlusive stenosis at the distal left V4 segment just prior to the vertebrobasilar junction (series 7, image 151). Right PICA patent proximally. Moderate multifocal atheromatous irregularity within the proximal left V4 segment with up to moderate approximate 50% stenosis. Vascular stent in place distally within the left V4 segment, extending distally to the proximal-mid basilar artery (series 7, image 149-proximal aspect, series 7 image 128-distal aspect). Possible stenoses at the stent margins difficult to evaluate given adjacent stent markers. There is wide patency of the stent through the distal left vertebral  artery and proximal basilar artery, markedly improved relative to prior CTA. Attenuated but patent flow within the proximal-mid basilar artery distal to the takeoff of the anterior inferior cerebellar arteries, with approximate moderate diffuse narrowing (series 8, image 123 coronal, series 7, image 133 axial). Basilar moderately narrowed just distal to the stent by approximately 50-60%, likely similar to previous CTA (series 7, image 124). Superior cerebellar arteries irregular but patent at their origins. Both of the PCAs appear to be primarily supplied via the basilar. Extensive atheromatous irregularity with short-segment moderate to severe proximal P1 stenoses (series 8, image 115 on the left, image 116 on the right). PCAs are patent distally but attenuated with extensive atheromatous irregularity. Venous sinuses: Patent. Anatomic variants: None significant. Delayed phase: No pathologic enhancement. Chronic left cerebellar infarcts noted. Review of the MIP images confirms the above findings CT Brain Perfusion Findings: CBF (<30%) Volume: 7m Perfusion (Tmax>6.0s) volume: 128mMismatch Volume: 1159mnfarction Location:Negative CT perfusion for core infarct. Patchy perfusion deficit at the mid cerebellar region, likely related to right V4 stenosis and possibly delayed flow through the stent. IMPRESSION: 1. Negative CTA for large vessel occlusion. 2. No evidence for core infarct by CT perfusion. Mild perfusion deficit within  the posterior fossa likely related to bilateral V4 stenoses. 3. 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 Widely patent flow through the proximal and mid aspect of the stent. Attenuated flow with up to approximate 50% narrowing through the distal aspect of the stent, distal to the anterior inferior cerebral arteries. Basilar artery is patent distally, with additional moderate to advanced atheromatous irregularity involving the SCAs and  PCAs bilaterally. 4. 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 5. Mild 35% atheromatous stenosis at the origin of the right ICA. 6. Widely patent anterior circulation within the head. Critical Value/emergent results were called by telephone at the time of interpretation on 02/08/2018 at 11:00 pm to Dr. Gerlene Fee , who verbally acknowledged these results. Electronically Signed   By: Jeannine Boga M.D.   On: 02/08/2018 23:33   Ct Head Code Stroke Wo Contrast  Result Date: 02/08/2018 CLINICAL DATA:  Code stroke.  Right-sided facial numbness EXAM: CT HEAD WITHOUT CONTRAST TECHNIQUE: Contiguous axial images were obtained from the base of the skull through the vertex without intravenous contrast. COMPARISON:  None. FINDINGS: Brain: There is no mass, hemorrhage or extra-axial collection. The size and configuration of the ventricles and extra-axial CSF spaces are normal. There is hypoattenuation of the periventricular white matter, most commonly indicating chronic ischemic microangiopathy. Vascular: No abnormal hyperdensity of the major intracranial arteries or dural venous sinuses. No intracranial atherosclerosis. Skull: Right parietal scalp hematoma.  No skull fracture. Sinuses/Orbits: No fluid levels or advanced mucosal thickening of the visualized paranasal sinuses. No mastoid or middle ear effusion. The orbits are normal. ASPECTS [facility] Stroke Program Early CT Score) - Ganglionic level infarction (caudate, lentiform nuclei, internal capsule, insula, M1-M3 cortex): 7 - Supraganglionic infarction (M4-M6 cortex): 3 Total score (0-10 with 10 being normal): 10 IMPRESSION: 1. No acute intracranial abnormality. 2. ASPECTS is 10. 3. Right parietal scalp hematoma without skull fracture. * These results were called by telephone at the time of interpretation on 02/08/2018 at 8:58 pm to Dr. Gerlene Fee , who verbally acknowledged these results. Electronically Signed   By: Ulyses Jarred M.D.   On:  02/08/2018 20:59    Labs:  CBC: Recent Labs    01/06/18 0556 01/07/18 1148 02/08/18 2104 02/08/18 2107 02/10/18 0555  WBC 10.7* 13.1* 9.9  --  8.8  HGB 13.2 13.6 13.6 14.3 13.1  HCT 41.4 42.6 42.9 42.0 42.4  PLT 282 325 352  --  323    COAGS: Recent Labs    11/30/17 1928 01/05/18 1654 02/08/18 2104  INR 1.02 0.93 0.98  APTT 35 28 34    BMP: Recent Labs    01/06/18 0556 01/07/18 1148 02/08/18 2104 02/08/18 2107 02/10/18 0555  NA 137 137 139 140 139  K 3.9 4.2 3.8 3.9 4.1  CL 105 105 104 105 104  CO2 _0 --  25  GLUCOSE 176* 199* 114* 109* 142*  BUN 22* _1 CALCIUM 9.0 9.5 9.6  --  9.2  CREATININE 1.04 1.23 1.21 1.30* 1.13  GFRNONAA >60 >60 >60  --  >60  GFRAA >60 >60 >60  --  >60    LIVER FUNCTION TESTS: Recent Labs    01/05/18 1654 01/06/18 0556 02/08/18 2104  BILITOT 0.6 0.5 0.4  AST _2 ALT _3 ALKPHOS 60 59 63  PROT 7.6 6.8 8.6*  ALBUMIN 4.3 3.8 4.5  TUMOR MARKERS: No results for input(s): AFPTM, CEA, CA199, CHROMGRNA in the last 8760 hours.  Assessment and Plan:  Pontine stroke. Plan for image-guided diagnostic cerebral angiogram tentatively for 02/12/2018 with Dr. [name]. Patient will be NPO at midnight. Afebrile and WBCs WNL. Patient is currently taking Brilinta and Aspirin- ok to proceed per Dr. [name]. Lovanox will be held at midnight tonight. INR 0.98 seconds 02/08/2018.  Risks and benefits of cerebral angiogram were discussed with the patient including, but not limited to bleeding, infection, vascular injury or contrast induced renal failure. This interventional procedure involves the use of X-rays and because of the nature of the planned procedure, it is possible that we will have prolonged use of X-ray fluoroscopy. Potential radiation risks to you include (but are not limited to) the following: - A slightly elevated risk for cancer  several years later in life. This risk is typically less than  0.5% percent. This risk is low in comparison to the normal incidence of human cancer, which is 33% for women and 50% for men according to the [employer]. - Radiation induced injury can include skin redness, resembling a rash, tissue breakdown / ulcers and hair loss (which can be temporary or permanent).  The likelihood of either of these occurring depends on the difficulty of the procedure and whether you are sensitive to radiation due to previous procedures, disease, or genetic conditions.  IF your procedure requires a prolonged use of radiation, you will be notified and given written instructions for further action.  It is your responsibility to monitor the irradiated area for the 2 weeks following the procedure and to notify your physician if you are concerned that you have suffered a radiation induced injury.   All of the patient's questions were answered, patient is agreeable to proceed. Consent signed and in chart.   Thank you for this interesting consult.  I greatly enjoyed meeting Devin [name] and look forward to participating in their care.  A copy of this report was sent to the requesting provider on this date.  Electronically Signed: Earley Abide, PA-C 02/11/2018, 11:19 AM   I spent a total of 20 Minutes in face to face in clinical consultation, greater than 50% of which was counseling/coordinating care for pontine stroke.

## 2018-02-11 NOTE — Plan of Care (Signed)
  Problem: Education: Goal: Knowledge of General Education information will improve Description Including pain rating scale, medication(s)/side effects and non-pharmacologic comfort measures Outcome: Progressing   Problem: Health Behavior/Discharge Planning: Goal: Ability to manage health-related needs will improve Outcome: Progressing   Problem: Clinical Measurements: Goal: Ability to maintain clinical measurements within normal limits will improve Outcome: Progressing   Problem: Activity: Goal: Risk for activity intolerance will decrease Outcome: Progressing   Problem: Nutrition: Goal: Adequate nutrition will be maintained Outcome: Progressing   Problem: Coping: Goal: Level of anxiety will decrease Outcome: Progressing   Problem: Elimination: Goal: Will not experience complications related to bowel motility Outcome: Progressing   Problem: Pain Managment: Goal: General experience of comfort will improve Outcome: Progressing   Problem: Safety: Goal: Ability to remain free from injury will improve Outcome: Progressing   Problem: Skin Integrity: Goal: Risk for impaired skin integrity will decrease Outcome: Progressing   Problem: Education: Goal: Knowledge of disease or condition will improve Outcome: Progressing Goal: Knowledge of secondary prevention will improve Outcome: Progressing Goal: Knowledge of patient specific risk factors addressed and post discharge goals established will improve Outcome: Progressing   Problem: Coping: Goal: Will verbalize positive feelings about self Outcome: Progressing   Problem: Self-Care: Goal: Ability to participate in self-care as condition permits will improve Outcome: Progressing   Problem: Ischemic Stroke/TIA Tissue Perfusion: Goal: Complications of ischemic stroke/TIA will be minimized Outcome: Progressing   Problem: Education: Goal: Knowledge of disease or condition will improve Outcome: Progressing Goal:  Knowledge of secondary prevention will improve Outcome: Progressing Goal: Knowledge of patient specific risk factors addressed and post discharge goals established will improve Outcome: Progressing   Problem: Self-Care: Goal: Ability to participate in self-care as condition permits will improve Outcome: Progressing   Problem: Ischemic Stroke/TIA Tissue Perfusion: Goal: Complications of ischemic stroke/TIA will be minimized Outcome: Progressing

## 2018-02-12 ENCOUNTER — Inpatient Hospital Stay (HOSPITAL_COMMUNITY): Payer: Self-pay

## 2018-02-12 LAB — CBC WITH DIFFERENTIAL/PLATELET
Abs Immature Granulocytes: 0.04 10*3/uL (ref 0.00–0.07)
BASOS ABS: 0 10*3/uL (ref 0.0–0.1)
Basophils Relative: 0 %
Eosinophils Absolute: 0.4 10*3/uL (ref 0.0–0.5)
Eosinophils Relative: 4 %
HCT: 41.7 % (ref 39.0–52.0)
Hemoglobin: 13.1 g/dL (ref 13.0–17.0)
Immature Granulocytes: 0 %
LYMPHS ABS: 4 10*3/uL (ref 0.7–4.0)
Lymphocytes Relative: 41 %
MCH: 27.9 pg (ref 26.0–34.0)
MCHC: 31.4 g/dL (ref 30.0–36.0)
MCV: 88.9 fL (ref 80.0–100.0)
Monocytes Absolute: 0.6 10*3/uL (ref 0.1–1.0)
Monocytes Relative: 6 %
NRBC: 0 % (ref 0.0–0.2)
Neutro Abs: 4.8 10*3/uL (ref 1.7–7.7)
Neutrophils Relative %: 49 %
Platelets: 323 10*3/uL (ref 150–400)
RBC: 4.69 MIL/uL (ref 4.22–5.81)
RDW: 13.6 % (ref 11.5–15.5)
WBC: 9.8 10*3/uL (ref 4.0–10.5)

## 2018-02-12 LAB — GLUCOSE, CAPILLARY
Glucose-Capillary: 124 mg/dL — ABNORMAL HIGH (ref 70–99)
Glucose-Capillary: 100 mg/dL — ABNORMAL HIGH (ref 70–99)
Glucose-Capillary: 186 mg/dL — ABNORMAL HIGH (ref 70–99)

## 2018-02-12 LAB — BASIC METABOLIC PANEL
Anion gap: 11 (ref 5–15)
BUN: 14 mg/dL (ref 6–20)
CALCIUM: 9.3 mg/dL (ref 8.9–10.3)
CHLORIDE: 102 mmol/L (ref 98–111)
CO2: 25 mmol/L (ref 22–32)
CREATININE: 1.24 mg/dL (ref 0.61–1.24)
GFR calc Af Amer: >60 mL/min (ref >60)
GFR calc non Af Amer: >60 mL/min (ref >60)
Glucose, Bld: 119 mg/dL — ABNORMAL HIGH (ref 70–99)
Potassium: 3.8 mmol/L (ref 3.5–5.1)
Sodium: 138 mmol/L (ref 135–145)

## 2018-02-12 NOTE — Progress Notes (Signed)
[facility]                                                                              Patient Demographics  Devin [name], is a 47 y.o. male, DOB - Sep 12, 1970, ZOX:[id number]  Admit date - 02/08/2018   Admitting Physician Briscoe Deutscher, MD  Outpatient Primary MD for the patient is [facility]  Outpatient specialists:   LOS - 3  days   Medical records reviewed and are as summarized below:    Chief Complaint  Patient presents with  . 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 Symptoms developed approximately 30 minutes prior to arrival in the ED. Not a candidate for TPA d/t recent stroke( in past 3 months). MRI brain> 5 mm punctate infarct right paramedian pons. Pt admitted for further management.   Assessment & Plan    Principal Problem:   Acute cerebrovascular accident (CVA) (HCC) -Presented with acute onset of right facial numbness. -MRI of the brain showed punctate 5 mm focus of diffusion abnormality involving the right paramedian pons, small chronic left cerebellar infarct -CTA head and neck showed vertebrobasilar stent in place, 60% left ICA stenosis -2D echo showed EF of 35 to 40%, no cardiac source of emboli -LDL 108, hemoglobin A1c 7.2, -Neurology recommended cerebral angiogram -Aspirin 81 mg daily, Brilinta 90 mg twice daily to continue on discharge.  Active Problems:   Hypertension -Continue current management, permissive hypertension due to stroke  Hyperlipidemia LDL 108, continue Zetia, added fenofibrate  Diabetes mellitus type  2 Hemoglobin A1c 7.2, continue SSI  Tobacco abuse Counseling provided    Chronic combined systolic and diastolic CHF (congestive heart failure) (HCC) 2D echo showed EF of 35 to 40% with grade 1 diastolic dysfunction, currently euvolemic, no significant interval change from prior echo  Code Status: Full CODE STATUS DVT Prophylaxis:   prophylactic anticoagulation on hold for cerebral angiogram  Family Communication: Discussed in detail with the patient, all imaging results, lab results explained to the patient    Disposition Plan: Hopefully DC home in a.m.  Time Spent in minutes   35 minutes  Procedures:  MRI brain  Consultants:   Neurology  Antimicrobials:      Medications  Scheduled Meds: . aspirin EC  81 mg Oral Daily  . citalopram  10 mg Oral Daily  . ezetimibe  10 mg Oral Daily  . fenofibrate  160 mg Oral Daily  . gabapentin  300 mg Oral BID  . insulin aspart  0-5 Units Subcutaneous QHS  . insulin aspart  0-9 Units Subcutaneous TID WC  . levothyroxine  175 mcg Oral QAC breakfast  . pantoprazole  40 mg Oral q morning - 10a  . sodium chloride flush  3 mL Intravenous Q12H  . ticagrelor  90 mg Oral BID  Continuous Infusions: . sodium chloride     PRN Meds:.sodium chloride, acetaminophen **OR** acetaminophen (TYLENOL) oral liquid 160 mg/5 mL **OR** acetaminophen, HYDROcodone-acetaminophen, ondansetron (ZOFRAN) IV, senna-docusate, sodium chloride flush   Antibiotics   Anti-infectives (From admission, onward)   None        Subjective:   Devin [name] was seen and examined today.  Patient denies dizziness, chest pain, shortness of breath, abdominal pain, N/V/D/C, new weakness, numbess, tingling. No acute events overnight.  Awaiting cerebral angiogram.  Objective:   Vitals:   02/11/18 2349 02/12/18 0349 02/12/18 0831 02/12/18 1233  BP: 123/72 140/77 131/82 (!) 141/95  Pulse: 61 64 (!) 55 (!) 51  Resp: 18 18 16 20   Temp: 98.7 F (37.1 C) 98.6 F (37 C)  97.7 F (36.5 C) 97.7 F (36.5 C)  TempSrc: Oral Oral Oral Oral  SpO2: 96% 97% 96% 98%  Weight:      Height:        Intake/Output Summary (Last 24 hours) at 02/12/2018 1429 Last data filed at 02/11/2018 2200 Gross per 24 hour  Intake 539 ml  Output -  Net 539 ml     Wt Readings from Last 3 Encounters:  02/09/18 122.3 kg  01/15/18 122.3 kg  01/08/18 126.1 kg     Exam  General: Alert and oriented x 3, NAD  Eyes:   HEENT:  Atraumatic, normocephalic  Cardiovascular: S1 S2 auscultated, Regular rate and rhythm.  Respiratory: Clear to auscultation bilaterally, no wheezing, rales or rhonchi  Gastrointestinal: Soft, nontender, nondistended, + bowel sounds  Ext: no pedal edema bilaterally  Neuro: AAOx3, Cr N's II- XII. Strength 5/5 upper and lower extremities bilaterally,   Musculoskeletal: No digital cyanosis, clubbing  Skin: No rashes  Psych: Normal affect and demeanor, alert and oriented x3    Data Reviewed:  I have personally reviewed following labs and imaging studies  Micro Results No results found for this or any previous visit (from the past 240 hour(s)).  Radiology Reports Ct Angio Head W Or Wo Contrast  Result Date: 02/08/2018 CLINICAL DATA:  Initial evaluation for acute right facial numbness. EXAM: CT ANGIOGRAPHY HEAD AND NECK CT PERFUSION BRAIN TECHNIQUE: Multidetector CT imaging of the head and neck was performed using the standard protocol during bolus administration of intravenous contrast. Multiplanar CT image reconstructions and MIPs were obtained to evaluate the vascular anatomy. Carotid stenosis measurements (when applicable) are obtained utilizing NASCET criteria, using the distal internal carotid diameter as the denominator. Multiphase CT imaging of the brain was performed following IV bolus contrast injection. Subsequent parametric perfusion maps were calculated using RAPID software. CONTRAST:  ISOVUE-370 IOPAMIDOL (ISOVUE-370) INJECTION 76%  COMPARISON:  Prior CT from earlier the same day. Comparison also made with prior CTA from 11/30/2017 and arteriogram from 12/03/2017 FINDINGS: CTA NECK FINDINGS Aortic arch: Visualized aortic arch of normal caliber with normal 3 vessel morphology. No flow-limiting stenosis about the origin of the great vessels. Visualized subclavian arteries widely patent. Right carotid system: Right common carotid artery patent from its origin to the bifurcation without hemodynamically significant stenosis. Concentric mixed plaque about the right bifurcation with associated mild stenosis of up to 35% by NASCET criteria. Right ICA otherwise widely patent to the skull base without stenosis, dissection, or occlusion. Left carotid system: Left common carotid artery patent from its origin to the bifurcation without stenosis. Mixed but predominantly soft plaque at the left bifurcation/proximal left ICA with associated stenosis of up to 60% by NASCET criteria (series 8, image 148).  Left ICA widely patent distally to the skull base without additional stenosis, dissection, or occlusion. Vertebral arteries: Both of the vertebral arteries arise from the subclavian arteries. Mild narrowing at the origin of the left vertebral artery. Vertebral arteries otherwise widely patent within the neck without stenosis, dissection, or occlusion. Skeleton: No acute osseous abnormality. No discrete lytic or blastic osseous lesions. 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 Poor dentition with prominent dental carie at the left first mandibular molar. Other neck: No other acute soft tissue abnormality within the neck. Upper chest: Visualized upper chest demonstrates no acute finding. Partially visualized lungs are grossly clear. Mild upper lobe predominant paraseptal emphysema. Review of the MIP images confirms the above findings CTA HEAD FINDINGS Anterior circulation: Internal carotid arteries widely patent to the termini without  hemodynamically significant stenosis. ICA termini well perfused and symmetric. A1 segments widely patent bilaterally. Normal anterior communicating artery. Anterior cerebral arteries widely patent to their distal aspects without stenosis. No M1 stenosis or occlusion. Normal MCA bifurcations. Distal MCA branches well perfused and symmetric. Posterior circulation: Vertebral arteries are co dominant and widely patent as they course into the cranial vault. Severe near occlusive stenosis at the distal left V4 segment just prior to the vertebrobasilar junction (series 7, image 151). Right PICA patent proximally. Moderate multifocal atheromatous irregularity within the proximal left V4 segment with up to moderate approximate 50% stenosis. Vascular stent in place distally within the left V4 segment, extending distally to the proximal-mid basilar artery (series 7, image 149-proximal aspect, series 7 image 128-distal aspect). Possible stenoses at the stent margins difficult to evaluate given adjacent stent markers. There is wide patency of the stent through the distal left vertebral artery and proximal basilar artery, markedly improved relative to prior CTA. Attenuated but patent flow within the proximal-mid basilar artery distal to the takeoff of the anterior inferior cerebellar arteries, with approximate moderate diffuse narrowing (series 8, image 123 coronal, series 7, image 133 axial). Basilar moderately narrowed just distal to the stent by approximately 50-60%, likely similar to previous CTA (series 7, image 124). Superior cerebellar arteries irregular but patent at their origins. Both of the PCAs appear to be primarily supplied via the basilar. Extensive atheromatous irregularity with short-segment moderate to severe proximal P1 stenoses (series 8, image 115 on the left, image 116 on the right). PCAs are patent distally but attenuated with extensive atheromatous irregularity. Venous sinuses: Patent. Anatomic variants:  None significant. Delayed phase: No pathologic enhancement. Chronic left cerebellar infarcts noted. Review of the MIP images confirms the above findings CT Brain Perfusion Findings: CBF (<30%) Volume: 0mL Perfusion (Tmax>6.0s) volume: 11mL Mismatch Volume: 11mL Infarction Location:Negative CT perfusion for core infarct. Patchy perfusion deficit at the mid cerebellar region, likely related to right V4 stenosis and possibly delayed flow through the stent. IMPRESSION: 1. Negative CTA for large vessel occlusion. 2. No evidence for core infarct by CT perfusion. Mild perfusion deficit within the posterior fossa likely related to bilateral V4 stenoses. 3. 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 Widely patent flow through the proximal and mid aspect of the stent. Attenuated flow with up to approximate 50% narrowing through the distal aspect of the stent, distal to the anterior inferior cerebral arteries. Basilar artery is patent distally, with additional moderate to advanced atheromatous irregularity involving the SCAs and PCAs bilaterally. 4. 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 5. Mild 35% atheromatous stenosis at the origin of the right  ICA. 6. Widely patent anterior circulation within the head. Critical Value/emergent results were called by telephone at the time of interpretation on 02/08/2018 at 11:00 pm to Dr. Kennis Carina , who verbally acknowledged these results. Electronically Signed   By: Rise Mu M.D.   On: 02/08/2018 23:33   Ct Angio Neck W And/or Wo Contrast  Result Date: 02/08/2018 CLINICAL DATA:  Initial evaluation for acute right facial numbness. EXAM: CT ANGIOGRAPHY HEAD AND NECK CT PERFUSION BRAIN TECHNIQUE: Multidetector CT imaging of the head and neck was performed using the standard protocol during bolus administration of intravenous contrast. Multiplanar CT image reconstructions and MIPs were obtained to  evaluate the vascular anatomy. Carotid stenosis measurements (when applicable) are obtained utilizing NASCET criteria, using the distal internal carotid diameter as the denominator. Multiphase CT imaging of the brain was performed following IV bolus contrast injection. Subsequent parametric perfusion maps were calculated using RAPID software. CONTRAST:  ISOVUE-370 IOPAMIDOL (ISOVUE-370) INJECTION 76% COMPARISON:  Prior CT from earlier the same day. Comparison also made with prior CTA from 11/30/2017 and arteriogram from 12/03/2017 FINDINGS: CTA NECK FINDINGS Aortic arch: Visualized aortic arch of normal caliber with normal 3 vessel morphology. No flow-limiting stenosis about the origin of the great vessels. Visualized subclavian arteries widely patent. Right carotid system: Right common carotid artery patent from its origin to the bifurcation without hemodynamically significant stenosis. Concentric mixed plaque about the right bifurcation with associated mild stenosis of up to 35% by NASCET criteria. Right ICA otherwise widely patent to the skull base without stenosis, dissection, or occlusion. Left carotid system: Left common carotid artery patent from its origin to the bifurcation without stenosis. Mixed but predominantly soft plaque at the left bifurcation/proximal left ICA with associated stenosis of up to 60% by NASCET criteria (series 8, image 148). Left ICA widely patent distally to the skull base without additional stenosis, dissection, or occlusion. Vertebral arteries: Both of the vertebral arteries arise from the subclavian arteries. Mild narrowing at the origin of the left vertebral artery. Vertebral arteries otherwise widely patent within the neck without stenosis, dissection, or occlusion. Skeleton: No acute osseous abnormality. No discrete lytic or blastic osseous lesions. 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 Poor dentition with prominent dental carie at the left first  mandibular molar. Other neck: No other acute soft tissue abnormality within the neck. Upper chest: Visualized upper chest demonstrates no acute finding. Partially visualized lungs are grossly clear. Mild upper lobe predominant paraseptal emphysema. Review of the MIP images confirms the above findings CTA HEAD FINDINGS Anterior circulation: Internal carotid arteries widely patent to the termini without hemodynamically significant stenosis. ICA termini well perfused and symmetric. A1 segments widely patent bilaterally. Normal anterior communicating artery. Anterior cerebral arteries widely patent to their distal aspects without stenosis. No M1 stenosis or occlusion. Normal MCA bifurcations. Distal MCA branches well perfused and symmetric. Posterior circulation: Vertebral arteries are co dominant and widely patent as they course into the cranial vault. Severe near occlusive stenosis at the distal left V4 segment just prior to the vertebrobasilar junction (series 7, image 151). Right PICA patent proximally. Moderate multifocal atheromatous irregularity within the proximal left V4 segment with up to moderate approximate 50% stenosis. Vascular stent in place distally within the left V4 segment, extending distally to the proximal-mid basilar artery (series 7, image 149-proximal aspect, series 7 image 128-distal aspect). Possible stenoses at the stent margins difficult to evaluate given adjacent stent markers. There is wide patency of the stent through the distal left  vertebral artery and proximal basilar artery, markedly improved relative to prior CTA. Attenuated but patent flow within the proximal-mid basilar artery distal to the takeoff of the anterior inferior cerebellar arteries, with approximate moderate diffuse narrowing (series 8, image 123 coronal, series 7, image 133 axial). Basilar moderately narrowed just distal to the stent by approximately 50-60%, likely similar to previous CTA (series 7, image 124). Superior  cerebellar arteries irregular but patent at their origins. Both of the PCAs appear to be primarily supplied via the basilar. Extensive atheromatous irregularity with short-segment moderate to severe proximal P1 stenoses (series 8, image 115 on the left, image 116 on the right). PCAs are patent distally but attenuated with extensive atheromatous irregularity. Venous sinuses: Patent. Anatomic variants: None significant. Delayed phase: No pathologic enhancement. Chronic left cerebellar infarcts noted. Review of the MIP images confirms the above findings CT Brain Perfusion Findings: CBF (<30%) Volume: 0mL Perfusion (Tmax>6.0s) volume: 11mL Mismatch Volume: 11mL Infarction Location:Negative CT perfusion for core infarct. Patchy perfusion deficit at the mid cerebellar region, likely related to right V4 stenosis and possibly delayed flow through the stent. IMPRESSION: 1. Negative CTA for large vessel occlusion. 2. No evidence for core infarct by CT perfusion. Mild perfusion deficit within the posterior fossa likely related to bilateral V4 stenoses. 3. 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 Widely patent flow through the proximal and mid aspect of the stent. Attenuated flow with up to approximate 50% narrowing through the distal aspect of the stent, distal to the anterior inferior cerebral arteries. Basilar artery is patent distally, with additional moderate to advanced atheromatous irregularity involving the SCAs and PCAs bilaterally. 4. 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 5. Mild 35% atheromatous stenosis at the origin of the right ICA. 6. Widely patent anterior circulation within the head. Critical Value/emergent results were called by telephone at the time of interpretation on 02/08/2018 at 11:00 pm to Dr. Kennis Carina , who verbally acknowledged these results. Electronically Signed   By: Rise Mu M.D.   On: 02/08/2018 23:33    Mr Brain Wo Contrast  Result Date: 02/09/2018 CLINICAL DATA:  Initial evaluation for acute headache with right-sided facial numbness. EXAM: MRI HEAD WITHOUT CONTRAST TECHNIQUE: Multiplanar, multiecho pulse sequences of the brain and surrounding structures were obtained without intravenous contrast. COMPARISON:  Prior CT and CTA from 02/08/2018 FINDINGS: Brain: Generalized age-related cerebral atrophy. Mild chronic microvascular ischemic disease noted involving the pons. Chronic left cerebellar infarct. Single 5 mm focus of diffusion abnormality seen within the right paramedian pons (series 5, image 66), suspicious for a small acute to early subacute ischemic infarct. No associated hemorrhage. No other evidence for acute or subacute ischemia. Gray-white matter differentiation otherwise maintained. No other areas of chronic cortical infarction. No acute intracranial hemorrhage. Small amount of chronic hemosiderin staining noted about the chronic left cerebellar infarct. No mass lesion, midline shift or mass effect. No hydrocephalus. No extra-axial fluid collection. Pituitary gland within normal limits. 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 Major intracranial vascular flow voids otherwise maintained. Skull and upper cervical spine: Craniocervical junction within normal limits. Bone marrow signal intensity normal. No scalp soft tissue abnormality. Sinuses/Orbits: Globes and orbital soft tissues within normal limits. Scattered mucosal thickening and opacity within the ethmoidal air cells. Paranasal sinuses are otherwise clear. No significant mastoid effusion. Inner ear structures grossly normal. Other:  None. IMPRESSION: 1. Punctate 5 mm focus of diffusion abnormality involving the right paramedian pons, suspicious for a small acute to early subacute small vessel type infarct. No associated hemorrhage.  2. No other acute intracranial abnormality. 3. Small chronic left cerebellar infarct. Electronically Signed   By: Rise Mu M.D.   On: 02/09/2018 05:44   Ct Cerebral Perfusion W Contrast  Result Date: 02/08/2018 CLINICAL DATA:  Initial evaluation for acute right facial numbness. EXAM: CT ANGIOGRAPHY HEAD AND NECK CT PERFUSION BRAIN TECHNIQUE: Multidetector CT imaging of the head and neck was performed using the standard protocol during bolus administration of intravenous contrast. Multiplanar CT image reconstructions and MIPs were obtained to evaluate the vascular anatomy. Carotid stenosis measurements (when applicable) are obtained utilizing NASCET criteria, using the distal internal carotid diameter as the denominator. Multiphase CT imaging of the brain was performed following IV bolus contrast injection. Subsequent parametric perfusion maps were calculated using RAPID software. CONTRAST:  ISOVUE-370 IOPAMIDOL (ISOVUE-370) INJECTION 76% COMPARISON:  Prior CT from earlier the same day. Comparison also made with prior CTA from 11/30/2017 and arteriogram from 12/03/2017 FINDINGS: CTA NECK FINDINGS Aortic arch: Visualized aortic arch of normal caliber with normal 3 vessel morphology. No flow-limiting stenosis about the origin of the great vessels. Visualized subclavian arteries widely patent. Right carotid system: Right common carotid artery patent from its origin to the bifurcation without hemodynamically significant stenosis. Concentric mixed plaque about the right bifurcation with associated mild stenosis of up to 35% by NASCET criteria. Right ICA otherwise widely patent to the skull base without stenosis, dissection, or occlusion. Left carotid system: Left common carotid artery patent from its origin to the bifurcation without stenosis. Mixed but predominantly soft plaque at the left bifurcation/proximal left ICA with associated stenosis of up to 60% by NASCET criteria (series 8, image 148).  Left ICA widely patent distally to the skull base without additional stenosis, dissection, or occlusion. Vertebral arteries: Both of the vertebral arteries arise from the subclavian arteries. Mild narrowing at the origin of the left vertebral artery. Vertebral arteries otherwise widely patent within the neck without stenosis, dissection, or occlusion. Skeleton: No acute osseous abnormality. No discrete lytic or blastic osseous lesions. 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 Poor dentition with prominent dental carie at the left first mandibular molar. Other neck: No other acute soft tissue abnormality within the neck. Upper chest: Visualized upper chest demonstrates no acute finding. Partially visualized lungs are grossly clear. Mild upper lobe predominant paraseptal emphysema. Review of the MIP images confirms the above findings CTA HEAD FINDINGS Anterior circulation: Internal carotid arteries widely patent to the termini without hemodynamically significant stenosis. ICA termini well perfused and symmetric. A1 segments widely patent bilaterally. Normal anterior communicating artery. Anterior cerebral arteries widely patent to their distal aspects without stenosis. No M1 stenosis or occlusion. Normal MCA bifurcations. Distal MCA branches well perfused and symmetric. Posterior circulation: Vertebral arteries are co dominant and widely patent as they course into the cranial vault. Severe near occlusive stenosis at the distal left V4 segment just prior to the vertebrobasilar junction (series 7, image 151). Right PICA patent proximally. Moderate multifocal atheromatous irregularity within the proximal left V4 segment with up to moderate approximate 50% stenosis. Vascular stent in place distally within the left V4 segment, extending distally to the proximal-mid basilar artery (series 7, image 149-proximal aspect, series 7 image 128-distal aspect). Possible stenoses at the stent margins difficult to  evaluate given adjacent stent markers. There is wide patency of the  stent through the distal left vertebral artery and proximal basilar artery, markedly improved relative to prior CTA. Attenuated but patent flow within the proximal-mid basilar artery distal to the takeoff of the anterior inferior cerebellar arteries, with approximate moderate diffuse narrowing (series 8, image 123 coronal, series 7, image 133 axial). Basilar moderately narrowed just distal to the stent by approximately 50-60%, likely similar to previous CTA (series 7, image 124). Superior cerebellar arteries irregular but patent at their origins. Both of the PCAs appear to be primarily supplied via the basilar. Extensive atheromatous irregularity with short-segment moderate to severe proximal P1 stenoses (series 8, image 115 on the left, image 116 on the right). PCAs are patent distally but attenuated with extensive atheromatous irregularity. Venous sinuses: Patent. Anatomic variants: None significant. Delayed phase: No pathologic enhancement. Chronic left cerebellar infarcts noted. Review of the MIP images confirms the above findings CT Brain Perfusion Findings: CBF (<30%) Volume: 0mL Perfusion (Tmax>6.0s) volume: 11mL Mismatch Volume: 11mL Infarction Location:Negative CT perfusion for core infarct. Patchy perfusion deficit at the mid cerebellar region, likely related to right V4 stenosis and possibly delayed flow through the stent. IMPRESSION: 1. Negative CTA for large vessel occlusion. 2. No evidence for core infarct by CT perfusion. Mild perfusion deficit within the posterior fossa likely related to bilateral V4 stenoses. 3. 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 Widely patent flow through the proximal and mid aspect of the stent. Attenuated flow with up to approximate 50% narrowing through the distal aspect of the stent, distal to the anterior inferior cerebral arteries. Basilar artery  is patent distally, with additional moderate to advanced atheromatous irregularity involving the SCAs and PCAs bilaterally. 4. 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 5. Mild 35% atheromatous stenosis at the origin of the right ICA. 6. Widely patent anterior circulation within the head. Critical Value/emergent results were called by telephone at the time of interpretation on 02/08/2018 at 11:00 pm to Dr. Kennis Carina , who verbally acknowledged these results. Electronically Signed   By: Rise Mu M.D.   On: 02/08/2018 23:33   Ct Head Code Stroke Wo Contrast  Result Date: 02/08/2018 CLINICAL DATA:  Code stroke.  Right-sided facial numbness EXAM: CT HEAD WITHOUT CONTRAST TECHNIQUE: Contiguous axial images were obtained from the base of the skull through the vertex without intravenous contrast. COMPARISON:  None. FINDINGS: Brain: There is no mass, hemorrhage or extra-axial collection. The size and configuration of the ventricles and extra-axial CSF spaces are normal. There is hypoattenuation of the periventricular white matter, most commonly indicating chronic ischemic microangiopathy. Vascular: No abnormal hyperdensity of the major intracranial arteries or dural venous sinuses. No intracranial atherosclerosis. Skull: Right parietal scalp hematoma.  No skull fracture. Sinuses/Orbits: No fluid levels or advanced mucosal thickening of the visualized paranasal sinuses. No mastoid or middle ear effusion. The orbits are normal. ASPECTS [facility] Stroke Program Early CT Score) - Ganglionic level infarction (caudate, lentiform nuclei, internal capsule, insula, M1-M3 cortex): 7 - Supraganglionic infarction (M4-M6 cortex): 3 Total score (0-10 with 10 being normal): 10 IMPRESSION: 1. No acute intracranial abnormality. 2. ASPECTS is 10. 3. Right parietal scalp hematoma without skull fracture. * These results were called by telephone at the time of interpretation on 02/08/2018 at 8:58 pm to Dr.  Kennis Carina , who verbally acknowledged these results. Electronically Signed   By: Deatra Robinson M.D.   On: 02/08/2018 20:59    Lab Data:  CBC: Recent Labs  Lab 02/08/18 2104 02/08/18 2107 02/10/18 0555 02/12/18 0355  WBC 9.9  --  8.8 9.8  NEUTROABS 4.6  --   --  4.8  HGB 13.6 14.3 13.1 13.1  HCT 42.9 42.0 42.4 41.7  MCV 89.6  --  90.2 88.9  PLT 352  --  323 323   Basic Metabolic Panel: Recent Labs  Lab 02/08/18 2104 02/08/18 2107 02/10/18 0555 02/12/18 0355  NA 139 140 139 138  K 3.8 3.9 4.1 3.8  CL 104 105 104 102  CO2 24  --  25 25  GLUCOSE 114* 109* 142* 119*  BUN 18 20 16 14   CREATININE 1.21 1.30* 1.13 1.24  CALCIUM 9.6  --  9.2 9.3   GFR: Estimated Creatinine Clearance: 109.3 mL/min (by C-G formula based on SCr of 1.24 mg/dL). Liver Function Tests: Recent Labs  Lab 02/08/18 2104  AST 16  ALT 15  ALKPHOS 63  BILITOT 0.4  PROT 8.6*  ALBUMIN 4.5   No results for input(s): LIPASE, AMYLASE in the last 168 hours. No results for input(s): AMMONIA in the last 168 hours. Coagulation Profile: Recent Labs  Lab 02/08/18 2104  INR 0.98   Cardiac Enzymes: No results for input(s): CKTOTAL, CKMB, CKMBINDEX, TROPONINI in the last 168 hours. BNP (last 3 results) No results for input(s): PROBNP in the last 8760 hours. HbA1C: Recent Labs    02/11/18 0615  HGBA1C 7.2*   CBG: Recent Labs  Lab 02/11/18 0618 02/11/18 1120 02/11/18 1726 02/11/18 2051 02/12/18 0621  GLUCAP 124* 192* 139* 204* 124*   Lipid Profile: Recent Labs    02/11/18 0615  CHOL 211*  HDL 25*  LDLCALC 108*  TRIG 391*  CHOLHDL 8.4   Thyroid Function Tests: No results for input(s): TSH, T4TOTAL, FREET4, T3FREE, THYROIDAB in the last 72 hours. Anemia Panel: No results for input(s): VITAMINB12, FOLATE, FERRITIN, TIBC, IRON, RETICCTPCT in the last 72 hours. Urine analysis:    Component Value Date/Time   COLORURINE YELLOW 02/10/2018 1929   APPEARANCEUR CLEAR 02/10/2018 1929    LABSPEC 1.020 02/10/2018 1929   PHURINE 5.0 02/10/2018 1929   GLUCOSEU NEGATIVE 02/10/2018 1929   HGBUR NEGATIVE 02/10/2018 1929   BILIRUBINUR NEGATIVE 02/10/2018 1929   KETONESUR NEGATIVE 02/10/2018 1929   PROTEINUR NEGATIVE 02/10/2018 1929   UROBILINOGEN 0.2 10/06/2013 0305   NITRITE NEGATIVE 02/10/2018 1929   LEUKOCYTESUR NEGATIVE 02/10/2018 1929     Katilin Raynes M.D. [facility] 02/12/2018, 2:29 PM  Pager: [phone] Between 7am to 7pm - call Pager - [phone]  After 7pm go to [email or web address] - password [email or web address]  Call night coverage person covering after 7pm

## 2018-02-12 NOTE — Care Management Note (Signed)
Case Management Note  Patient Details  Name: Varney DailyCarl L [place] MRN: [id number] Date of Birth: [birth year]  Subjective/Objective:    Pt admitted with stroke. He is from home alone. Pt states his sister and cousin can check in on him and assist as needed.  DME: cane, walker, 3 in 1 [facility]. He uses [facility] in [place] for his medications. The HD assists with the cost.  No transportation issues. Pt drives or his brother in law will provide transport.                  Action/Plan: No f/u per PT/OT and no DME needs.  Pt has transportation home.     Expected Discharge Date:                  Expected Discharge Plan:  Home/Self Care  In-House Referral:     Discharge planning Services  CM Consult  Post Acute Care Choice:    Choice offered to:     DME Arranged:    DME Agency:     HH Arranged:    HH Agency:     Status of Service:  In process, will continue to follow  If discussed at Long Length of Stay Meetings, dates discussed:    Additional Comments:  Kermit BaloKelli F Christeen Lai, RN 02/12/2018, 12:22 PM

## 2018-02-12 NOTE — Progress Notes (Signed)
STROKE TEAM PROGRESS NOTE   SUBJECTIVE (INTERVAL HISTORY) Wife is at bedside.  The patient no acute event overnight. No complains. Pending DSA today but not able to perform, postponed to tomorrow. Discussed him about [facility] trial.   OBJECTIVE Vitals:   02/11/18 2349 02/12/18 0349 02/12/18 0831 02/12/18 1233  BP: 123/72 140/77 131/82 (!) 141/95  Pulse: 61 64 (!) 55 (!) 51  Resp: 18 18 16 20   Temp: 98.7 F (37.1 C) 98.6 F (37 C) 97.7 F (36.5 C) 97.7 F (36.5 C)  TempSrc: Oral Oral Oral Oral  SpO2: 96% 97% 96% 98%  Weight:      Height:        CBC:  Recent Labs  Lab 02/08/18 2104  02/10/18 0555 02/12/18 0355  WBC 9.9  --  8.8 9.8  NEUTROABS 4.6  --   --  4.8  HGB 13.6   < > 13.1 13.1  HCT 42.9   < > 42.4 41.7  MCV 89.6  --  90.2 88.9  PLT 352  --  323 323   < > = values in this interval not displayed.    Basic Metabolic Panel:  Recent Labs  Lab 02/10/18 0555 02/12/18 0355  NA 139 138  K 4.1 3.8  CL 104 102  CO2 25 25  GLUCOSE 142* 119*  BUN 16 14  CREATININE 1.13 1.24  CALCIUM 9.2 9.3    Lipid Panel:     Component Value Date/Time   CHOL 211 (H) 02/11/2018 0615   TRIG 391 (H) 02/11/2018 0615   HDL 25 (L) 02/11/2018 0615   CHOLHDL 8.4 02/11/2018 0615   VLDL 78 (H) 02/11/2018 0615   LDLCALC 108 (H) 02/11/2018 0615   HgbA1c:  Lab Results  Component Value Date   HGBA1C 7.2 (H) 02/11/2018   Urine Drug Screen:     Component Value Date/Time   LABOPIA NONE DETECTED 02/10/2018 1929   COCAINSCRNUR NONE DETECTED 02/10/2018 1929   LABBENZ NONE DETECTED 02/10/2018 1929   AMPHETMU NONE DETECTED 02/10/2018 1929   THCU NONE DETECTED 02/10/2018 1929   LABBARB NONE DETECTED 02/10/2018 1929    Alcohol Level     Component Value Date/Time   ETH <10 02/08/2018 2104    IMAGING  Ct Angio Head W Or Wo Contrast Ct Angio Neck W And/or Wo Contrast Ct Cerebral Perfusion W Contrast 02/08/2018 IMPRESSION:  1. Negative CTA for large vessel occlusion.  2. No  evidence for core infarct by CT perfusion. Mild perfusion deficit within the posterior fossa likely related to bilateral V4 stenoses.  3. 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 Widely patent flow through the proximal and mid aspect of the stent. Attenuated flow with up to approximate 50% narrowing through the distal aspect of the stent, distal to the anterior inferior cerebral arteries. Basilar artery is patent distally, with additional moderate to advanced atheromatous irregularity involving the SCAs and PCAs bilaterally.  4. 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  5. Mild 35% atheromatous stenosis at the origin of the right ICA.  6. Widely patent anterior circulation within the head.    Mr Brain Wo Contrast 02/09/2018 IMPRESSION:  1. Punctate 5 mm focus of diffusion abnormality involving the right paramedian pons, suspicious for a small acute to early subacute small vessel type infarct. No associated hemorrhage.  2. No other acute intracranial abnormality.  3. Small chronic left cerebellar infarct.    Ct Head  Code Stroke Wo Contrast 02/08/2018 IMPRESSION:  1. No acute intracranial abnormality.  2. ASPECTS is 10.  3. Right parietal scalp hematoma without skull fracture.     Transthoracic Echocardiogram  02/09/2018 Study Conclusions - Left ventricle: The cavity size was normal. There was mild   concentric hypertrophy. Systolic function was moderately reduced.   The estimated ejection fraction was in the range of 35% to 40%.   Diffuse hypokinesis. Doppler parameters are consistent with   abnormal left ventricular relaxation (grade 1 diastolic   dysfunction). - Mitral valve: There was mild regurgitation. - Pericardium, extracardiac: A trivial pericardial effusion was   identified along the right ventricular free wall. Impressions: - Compared to the prior study, there has been no significant   interval  change.     PHYSICAL EXAM Blood pressure (!) 141/95, pulse (!) 51, temperature 97.7 F (36.5 C), temperature source Oral, resp. rate 20, height 6\' 6"  (1.981 m), weight 122.3 kg, SpO2 98 %.   Pleasant middle-aged Caucasian obese male not in distress. . Afebrile. Head is nontraumatic. Neck is supple without bruit.    Cardiac exam no murmur or gallop. Lungs are clear to auscultation. Distal pulses are well felt.  Neurological Exam ;  Awake  Alert oriented x 3. Normal speech and language.eye movements full without nystagmus.fundi were not visualized. Vision acuity and fields appear normal. Hearing is normal. Palatal movements are normal. Face symmetric. Tongue midline. Normal strength, tone, reflexes and coordination. Normal sensation. Gait deferred.   ASSESSMENT/PLAN [name] is a 47 y.o. 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 He did not receive IV t-PA due to late presentation and recent stroke.  Stroke:  right paramedian pons infarct - small vessel disease, need to rule out basilar artery stent restenosis  Resultant back to baseline  CT head - No acute intracranial abnormality.   MRI head - Punctate 5 mm focus of diffusion abnormality involving the right paramedian pons. Small chronic left cerebellar infarct.   CTA H&N - Vertebrobasilar stent in place extending from the left V4 segment through the mid basilar artery. Widely patent flow through the proximal and mid aspect of the stent. Attenuated flow with up to approximate 50% narrowing through the distal aspect of the stent. 60% Lt ICA stenosis.  Cerebral angiogram pending  2D Echo - EF 35 - 40%. No cardiac source of emboli identified.   LDL - 108  HgbA1c - 7.2  P2Y12 = 23  UDS negative  VTE prophylaxis - Lovenox  Diet - Heart healthy / carb modified with thin  liquids.  aspirin 81 mg daily and Brillinta 90 mg twice daily prior to admission, now on aspirin 81 mg daily and Brillinta 90 mg twice daily.  Continue on discharge  Patient counseled to be compliant with his antithrombotic medications  Ongoing aggressive stroke risk factor management  Therapy recommendations:  None  Disposition:  Pending  History of stroke  11/2017 presented with left arm numbness and chest pain.  CT showed left cerebellum old infarct.  MRI showed no acute infarct.  CTA head and neck basilar artery and bilateral vertebral artery near occlusion.  EF 35 to 40%.  LDL 205 and A1c 8.5.  Status post basal artery and vertebral artery stenting 12/03/2017.  Discharged on aspirin Brilinta and Zetia.  12/2017 patient readmitted for headache, dizziness and arm shaking.  CTA head and  neck unchanged from prior, still showing right V4 occlusion, and bilateral stent patent.  EEG negative.  A1c 7.9, TG 793.  P2Y12 = 46.  Continued on aspirin Brilinta and Zetia on discharge.  Hypotension and a history of hypertension  Stable at the goal  Not on BP meds  Encourage p.o. intake . Long-term BP goal 130-150 given posterior circulation stenosis.  Hyperlipidemia  Lipid lowering medication PTA:  Zetia 10 mg daily  LDL 108, goal < 70  TG 391  Current lipid lowering medication: Zetia 10 mg daily  Will add fenofibrate 160 mg daily  Pt intolerant to statins  Continue Zetia and fenofibrate on discharge  Diabetes  HgbA1c 7.2, goal < 7.0  Uncontrolled  Improved from prior  SSI  CBG monitoring  Close PCP follow-up  Tobacco abuse  Current smoker  Has cut down dramatically from prior  Smoking cessation counseling provided  Pt is willing to quit  Other Stroke Risk Factors  Obesity, Body mass index is 31.16 kg/m., recommend weight loss, diet and exercise as appropriate   Family hx stroke (aunt and uncle)  Coronary artery disease status post stent 11/2017   Other  Active Problems    Hospital day # 3   Marvel Plan, MD PhD Stroke Neurology 02/12/2018 4:09 PM   To contact Stroke Continuity provider, please refer to [email or web address]. After hours, contact General Neurology

## 2018-02-12 NOTE — Progress Notes (Signed)
Patient was scheduled for an image-guided diagnostic cerebral angiogram tentatively for today with Dr. Corliss Skainseveshwar.  Unfortunately, there was an emergent code stroke that required Dr. Fatima Sangereveshwar's immediate attention at this time. Because of this, patient's procedure will be postponed and rescheduled tentatively for tomorrow 02/13/2018.  Dr. Corliss Skainseveshwar spoke with patient to make him aware. All questions answered and concerns addressed.  Restarted heart healthy/carb modified diet and patient to be NPO at midnight. Please call IR with questions/concerns.  Waylan Bogalexandra M Modesty Rudy, PA-C 02/12/2018, 3:52 PM

## 2018-02-12 NOTE — Progress Notes (Signed)
Pt remained NPO for procedure this am .

## 2018-02-13 ENCOUNTER — Inpatient Hospital Stay (HOSPITAL_COMMUNITY): Payer: Self-pay

## 2018-02-13 ENCOUNTER — Encounter (HOSPITAL_COMMUNITY): Payer: Self-pay

## 2018-02-13 DIAGNOSIS — F17200 Nicotine dependence, unspecified, uncomplicated: Secondary | ICD-10-CM

## 2018-02-13 DIAGNOSIS — E785 Hyperlipidemia, unspecified: Secondary | ICD-10-CM

## 2018-02-13 HISTORY — PX: IR US GUIDE VASC ACCESS RIGHT: IMG2390

## 2018-02-13 HISTORY — PX: IR ANGIO VERTEBRAL SEL SUBCLAVIAN INNOMINATE UNI L MOD SED: IMG5364

## 2018-02-13 HISTORY — PX: IR ANGIO INTRA EXTRACRAN SEL COM CAROTID INNOMINATE BILAT MOD SED: IMG5360

## 2018-02-13 HISTORY — PX: IR ANGIO VERTEBRAL SEL VERTEBRAL UNI R MOD SED: IMG5368

## 2018-02-13 LAB — GLUCOSE, CAPILLARY
GLUCOSE-CAPILLARY: 223 mg/dL — AB (ref 70–99)
Glucose-Capillary: 126 mg/dL — ABNORMAL HIGH (ref 70–99)

## 2018-02-13 MED ORDER — SODIUM CHLORIDE 0.9 % IV SOLN: INTRAVENOUS | Status: DC

## 2018-02-13 MED ORDER — HEPARIN SODIUM (PORCINE) 1000 UNIT/ML IJ SOLN
INTRAMUSCULAR | Status: AC
  Filled 2018-02-13: qty 1

## 2018-02-13 MED ORDER — MIDAZOLAM HCL 2 MG/2ML IJ SOLN
INTRAMUSCULAR | Status: AC
  Filled 2018-02-13: qty 2

## 2018-02-13 MED ORDER — VERAPAMIL HCL 2.5 MG/ML IV SOLN
INTRAVENOUS | Status: AC
  Filled 2018-02-13: qty 2

## 2018-02-13 MED ORDER — FENTANYL CITRATE (PF) 100 MCG/2ML IJ SOLN
INTRAMUSCULAR | Status: AC | PRN
  Administered 2018-02-13: 25 ug via INTRAVENOUS

## 2018-02-13 MED ORDER — MIDAZOLAM HCL 2 MG/2ML IJ SOLN
INTRAMUSCULAR | Status: AC | PRN
  Administered 2018-02-13: 1 mg via INTRAVENOUS

## 2018-02-13 MED ORDER — LIDOCAINE HCL (PF) 1 % IJ SOLN
INTRAMUSCULAR | Status: AC | PRN
  Administered 2018-02-13: 5 mL

## 2018-02-13 MED ORDER — NITROGLYCERIN 1 MG/10 ML FOR IR/CATH LAB
INTRA_ARTERIAL | Status: AC | PRN
  Administered 2018-02-13: 200 ug via INTRA_ARTERIAL
  Administered 2018-02-13 (×2): 100 ug via INTRA_ARTERIAL

## 2018-02-13 MED ORDER — FENOFIBRATE 160 MG PO TABS: 160.0000 mg | ORAL_TABLET | Freq: Every day | ORAL | 3 refills | Status: DC

## 2018-02-13 MED ORDER — NITROGLYCERIN 1 MG/10 ML FOR IR/CATH LAB
INTRA_ARTERIAL | Status: AC
  Filled 2018-02-13: qty 10

## 2018-02-13 MED ORDER — LIDOCAINE HCL 1 % IJ SOLN
INTRAMUSCULAR | Status: AC
  Filled 2018-02-13: qty 20

## 2018-02-13 MED ORDER — FENTANYL CITRATE (PF) 100 MCG/2ML IJ SOLN
INTRAMUSCULAR | Status: AC
  Filled 2018-02-13: qty 2

## 2018-02-13 MED ORDER — HEPARIN SODIUM (PORCINE) 1000 UNIT/ML IJ SOLN
INTRAMUSCULAR | Status: AC | PRN
  Administered 2018-02-13: 2000 [IU] via INTRAVENOUS

## 2018-02-13 MED ORDER — IOPAMIDOL (ISOVUE-300) INJECTION 61%
INTRAVENOUS | Status: AC
  Filled 2018-02-13: qty 50

## 2018-02-13 MED ORDER — TICAGRELOR 90 MG PO TABS: 90.0000 mg | ORAL_TABLET | Freq: Two times a day (BID) | ORAL | 3 refills | Status: DC

## 2018-02-13 MED ORDER — IOHEXOL 300 MG/ML  SOLN: 66.0000 mL | Freq: Once | INTRAMUSCULAR | Status: DC | PRN

## 2018-02-13 MED ORDER — VERAPAMIL HCL 2.5 MG/ML IV SOLN
INTRAVENOUS | Status: AC | PRN
  Administered 2018-02-13: 2.5 mg via INTRA_ARTERIAL

## 2018-02-13 NOTE — Procedures (Signed)
S/P 4 vessel cerebral arteriogram RT rad approach. Findings. 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

## 2018-02-13 NOTE — Progress Notes (Signed)
STROKE TEAM PROGRESS NOTE   SUBJECTIVE (INTERVAL HISTORY) Wife is at bedside.  The patient had DSA today showed still has distal in-stent stenosis. Pt tolerated procedure well.  Plan for basal artery in-stent angioplasty next week.  OBJECTIVE Vitals:   02/13/18 1350 02/13/18 1355 02/13/18 1500 02/13/18 1538  BP: 113/62 110/63 120/68 122/77  Pulse: (!) 48 (!) 52 (!) 58 (!) 59  Resp: 18 19 18 18   Temp:      TempSrc:      SpO2: 99% 95% 96% 97%  Weight:      Height:        CBC:  Recent Labs  Lab 02/08/18 2104  02/10/18 0555 02/12/18 0355  WBC 9.9  --  8.8 9.8  NEUTROABS 4.6  --   --  4.8  HGB 13.6   < > 13.1 13.1  HCT 42.9   < > 42.4 41.7  MCV 89.6  --  90.2 88.9  PLT 352  --  323 323   < > = values in this interval not displayed.    Basic Metabolic Panel:  Recent Labs  Lab 02/10/18 0555 02/12/18 0355  NA 139 138  K 4.1 3.8  CL 104 102  CO2 25 25  GLUCOSE 142* 119*  BUN 16 14  CREATININE 1.13 1.24  CALCIUM 9.2 9.3    Lipid Panel:     Component Value Date/Time   CHOL 211 (H) 02/11/2018 0615   TRIG 391 (H) 02/11/2018 0615   HDL 25 (L) 02/11/2018 0615   CHOLHDL 8.4 02/11/2018 0615   VLDL 78 (H) 02/11/2018 0615   LDLCALC 108 (H) 02/11/2018 0615   HgbA1c:  Lab Results  Component Value Date   HGBA1C 7.2 (H) 02/11/2018   Urine Drug Screen:     Component Value Date/Time   LABOPIA NONE DETECTED 02/10/2018 1929   COCAINSCRNUR NONE DETECTED 02/10/2018 1929   LABBENZ NONE DETECTED 02/10/2018 1929   AMPHETMU NONE DETECTED 02/10/2018 1929   THCU NONE DETECTED 02/10/2018 1929   LABBARB NONE DETECTED 02/10/2018 1929    Alcohol Level     Component Value Date/Time   ETH <10 02/08/2018 2104    IMAGING  Ct Angio Head W Or Wo Contrast Ct Angio Neck W And/or Wo Contrast Ct Cerebral Perfusion W Contrast 02/08/2018 IMPRESSION:  1. Negative CTA for large vessel occlusion.  2. No evidence for core infarct by CT perfusion. Mild perfusion deficit within the  posterior fossa likely related to bilateral V4 stenoses.  3. 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 Widely patent flow through the proximal and mid aspect of the stent. Attenuated flow with up to approximate 50% narrowing through the distal aspect of the stent, distal to the anterior inferior cerebral arteries. Basilar artery is patent distally, with additional moderate to advanced atheromatous irregularity involving the SCAs and PCAs bilaterally.  4. 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  5. Mild 35% atheromatous stenosis at the origin of the right ICA.  6. Widely patent anterior circulation within the head.    Mr Brain Wo Contrast 02/09/2018 IMPRESSION:  1. Punctate 5 mm focus of diffusion abnormality involving the right paramedian pons, suspicious for a small acute to early subacute small vessel type infarct. No associated hemorrhage.  2. No other acute intracranial abnormality.  3. Small chronic left cerebellar infarct.    Ct Head Code Stroke Wo Contrast 02/08/2018 IMPRESSION:  1. No acute intracranial abnormality.  2. ASPECTS is 10.  3. Right parietal scalp hematoma without skull fracture.     Transthoracic Echocardiogram  02/09/2018 Study Conclusions - Left ventricle: The cavity size was normal. There was mild   concentric hypertrophy. Systolic function was moderately reduced.   The estimated ejection fraction was in the range of 35% to 40%.   Diffuse hypokinesis. Doppler parameters are consistent with   abnormal left ventricular relaxation (grade 1 diastolic   dysfunction). - Mitral valve: There was mild regurgitation. - Pericardium, extracardiac: A trivial pericardial effusion was   identified along the right ventricular free wall. Impressions: - Compared to the prior study, there has been no significant   interval change.  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 rate 18, height 6\' 6"  (1.981 m), weight 122.3 kg, SpO2 97 %.   Pleasant middle-aged Caucasian obese male not in distress. . Afebrile. Head is nontraumatic. Neck is supple without bruit.    Cardiac exam no murmur or gallop. Lungs are clear to auscultation. Distal pulses are well felt.  Neurological Exam ;  Awake  Alert oriented x 3. Normal speech and language.eye movements full without nystagmus.fundi were not visualized. Vision acuity and fields appear normal. Hearing is normal. Palatal movements are normal. Face symmetric. Tongue midline. Normal strength, tone, reflexes and coordination. Normal sensation. Gait deferred.   ASSESSMENT/PLAN [name] is a 47 y.o. 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 He did not receive IV t-PA due to late presentation and recent stroke.  Stroke:  right paramedian pons infarct - likely due to BA in-stent severe stenosis   Resultant back to baseline  CT head - No acute intracranial abnormality.   MRI head - Punctate 5 mm focus of diffusion abnormality involving the right paramedian pons. Small chronic left cerebellar infarct.   CTA H&N - Vertebrobasilar stent in place extending from the left V4 segment through the mid basilar artery. Widely patent flow through the proximal and mid aspect of the stent. Attenuated flow with up to approximate 50% narrowing through the distal aspect of the stent. 60% Lt ICA stenosis.  Cerebral angiogram severe distal in-stent stenosis in the proximal BA  Plan for in-stent angioplasty next week with Dr. Corliss Skains  2D Echo - EF 35 - 40%. No cardiac source of emboli identified.   LDL - 108  HgbA1c - 7.2  P2Y12 = 23  UDS  negative  VTE prophylaxis - Lovenox  Diet - Heart healthy / carb modified with thin liquids.  aspirin 81 mg daily and Brillinta 90 mg twice daily prior to admission, now on aspirin 81 mg daily and Brillinta 90 mg twice daily.  Continue on discharge  Patient counseled to be compliant with his antithrombotic medications  Ongoing aggressive stroke risk factor management  Therapy recommendations:  None  Disposition: Home today  History of stroke  11/2017 presented with left arm numbness and chest pain.  CT showed left cerebellum old infarct.  MRI showed no acute infarct.  CTA head and neck basilar artery and bilateral vertebral artery near occlusion.  EF 35 to 40%.  LDL 205 and A1c 8.5.  Status post basal artery and vertebral artery stenting 12/03/2017.  Discharged on aspirin Brilinta and Zetia.  12/2017 patient readmitted for headache, dizziness and arm shaking.  CTA head and neck unchanged from prior, still showing right V4 occlusion, and bilateral stent patent.  EEG negative.  A1c 7.9, TG 793.  P2Y12 = 46.  Continued on aspirin Brilinta and Zetia on discharge.  Hypotension and a history of hypertension  Stable at the goal  Not on BP meds  Encourage p.o. intake . Long-term BP goal 130-150 given posterior circulation stenosis.  Hyperlipidemia  Lipid lowering medication PTA:  Zetia 10 mg daily  LDL 108, goal < 70  TG 391  Current lipid lowering medication: Zetia 10 mg daily  Added fenofibrate 160 mg daily  Pt intolerant to statins  Continue Zetia and fenofibrate on discharge  Diabetes  HgbA1c 7.2, goal < 7.0  Uncontrolled  Improved from prior  SSI  CBG monitoring  Close PCP follow-up  Tobacco abuse  Current smoker  Has cut down dramatically from prior  Smoking cessation counseling provided  Pt is willing to quit  Other Stroke Risk Factors  Obesity, Body mass index is 31.16 kg/m., recommend weight loss, diet and exercise as appropriate   Family hx  stroke (aunt and uncle)  Coronary artery disease status post stent 11/2017   Other Active Problems    Hospital day # 4   Neurology will sign off. Please call with questions. Pt will follow up with Dr. Frances Furbish at [facility] in about 4 weeks. Thanks for the consult.   Marvel Plan, MD PhD Stroke Neurology 02/13/2018 3:58 PM   To contact Stroke Continuity provider, please refer to [email or web address]. After hours, contact General Neurology

## 2018-02-13 NOTE — Progress Notes (Signed)
Air from TR band completely at 1530.  Vascular assessment within normal limits. Pulses strong. Skin slightly cold, patient states this is baseline as he has been told before he does not "have good circulation". Sheath insertion site dry with old blood noted around perimeter of the now removed band.  No neurological changes. Tegaderm dressing placed. Patient aware to elevate wrist above level of heart and leave dressing in place for the next 24 hours. Agreeable. Education provided on complications that could occur at home. Patient to apply pressure to site if area starts to bleed and will [facility]. Lawson RadarHeather M Masaichi Kracht

## 2018-02-13 NOTE — Discharge Summary (Signed)
Physician Discharge Summary   Patient ID: Devin [name] MRN: [id number] DOB/AGE: February 26, 1971 47 y.o.  Admit date: 02/08/2018 Discharge date: 02/13/2018  Primary Care Physician:  [facility]   Recommendations for Outpatient Follow-up:  1. Follow up with PCP in 1-2 weeks 2. Cerebral angiogram showed severe distal IntraStent stenosis in the proximal basilar artery, occluded old right VBG.  Plan for elective angioplasty  Home Health: None Equipment/Devices:   Discharge Condition: stable  CODE STATUS: FULL  Diet recommendation: Carb modified diet   Discharge Diagnoses:    . Acute right paramedian pons CVA . Coronary artery disease . Hypothyroidism . Hypertension . Basilar artery occlusion . Chronic combined systolic and diastolic CHF (congestive heart failure) ([place]) Diabetes mellitus type 2  Consults: Neurology Interventional radiology    Allergies:   Allergies  Allergen Reactions  . Bee Venom Swelling  . Crestor [Rosuvastatin] Other (See Comments)    rhabdomyolosis     DISCHARGE MEDICATIONS: Allergies as of 02/13/2018      Reactions   Bee Venom Swelling   Crestor [rosuvastatin] Other (See Comments)   rhabdomyolosis      Medication List    TAKE these medications   aspirin 81 MG EC tablet Take 1 tablet (81 mg total) by mouth daily. With food   Blood Glucose Monitoring Suppl w/Device Kit Dispense based on patient and insurance preference. Check blood sugar daily before breakfast (ICD9 250.0)   citalopram 10 MG tablet Commonly known as:  CELEXA Take 1 tablet (10 mg total) by mouth daily.   ezetimibe 10 MG tablet Commonly known as:  ZETIA Take 1 tablet (10 mg total) by mouth daily.   fenofibrate 160 MG tablet Take 1 tablet (160 mg total) by mouth daily.   gabapentin 300 MG capsule Commonly known as:  NEURONTIN Take 1 capsule (300 mg total) by mouth 2 (two) times daily.   glimepiride 2 MG tablet Commonly known as:   AMARYL Take 1 tablet (2 mg total) by mouth daily with breakfast.   levothyroxine 175 MCG tablet Commonly known as:  SYNTHROID, LEVOTHROID Take 175 mcg by mouth daily before breakfast.   losartan 25 MG tablet Commonly known as:  COZAAR Take 0.5 tablets (12.5 mg total) by mouth daily.   nitroGLYCERIN 0.4 MG SL tablet Commonly known as:  NITROSTAT Place 1 tablet (0.4 mg total) under the tongue every 5 (five) minutes as needed for chest pain.   pantoprazole 40 MG tablet Commonly known as:  PROTONIX Take 40 mg by mouth every morning.   sitaGLIPtin-metformin 50-1000 MG tablet Commonly known as:  JANUMET Take 1 tablet by mouth 2 (two) times daily with a meal.   ticagrelor 90 MG Tabs tablet Commonly known as:  BRILINTA Take 1 tablet (90 mg total) by mouth 2 (two) times daily.        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 Symptoms developed approximately 30 minutes prior to arrival in the ED. Not a candidate for TPA d/t recent stroke( in past 3 months).MRI brain> 5 mm punctate infarct right paramedian pons.Pt admitted for further management.  Hospital Course:    Acute cerebrovascular  accident (CVA) [facility]) -Presented with acute onset of right facial numbness. -MRI of the brain showed punctate 5 mm focus of diffusion abnormality involving the right paramedian pons, small chronic left cerebellar infarct -CTA head and neck showed vertebrobasilar stent in place, 60% left ICA stenosis -2D echo showed EF of 35 to 40%, no cardiac source of emboli -LDL  108, hemoglobin A1c 7.2, -Aspirin 81 mg daily, Brilinta 90 mg twice daily to continue on discharge. -Patient underwent cerebral angiogram which showed severe distal IntraStent stenosis in the proximal basilar artery, occluded right VBJ, plan for elective angioplasty in 1 week by Dr [name]     Hypertension -BP control recommended, continue losartan  Hyperlipidemia LDL 108, continue Zetia, added fenofibrate  Diabetes mellitus type 2 Hemoglobin A1c 7.2, patient was placed on sliding scale insulin while inpatient continue Janumet  Tobacco abuse Counseling provided    Chronic combined systolic and diastolic CHF (congestive heart failure) (HCC) 2D echo showed EF of 35 to 40% with grade 1 diastolic dysfunction, currently euvolemic, no significant interval change from prior echo   Day of Discharge S: No acute issues, hoping to go home after cerebral angiogram  BP 110/63   Pulse (!) 52   Temp 98.5 F (36.9 C) (Oral)   Resp 19   Ht 6' 6"  (1.981 m)   Wt 122.3 kg   SpO2 95%   BMI 31.16 kg/m   Physical Exam: General: Alert and awake oriented x3 not in any acute distress. HEENT: anicteric sclera, pupils reactive to light and accommodation CVS: S1-S2 clear no murmur rubs or gallops Chest: clear to auscultation bilaterally, no wheezing rales or rhonchi Abdomen: soft nontender, nondistended, normal bowel sounds Extremities: no cyanosis, clubbing or edema noted bilaterally Neuro: Cranial nerves II-XII intact, no focal neurological deficits   The results of significant diagnostics from this hospitalization (including imaging, microbiology, ancillary and laboratory) are listed below for reference.      Procedures/Studies:  Ct Angio Head W Or Wo Contrast  Result Date: 02/08/2018 CLINICAL DATA:  Initial evaluation for acute right facial numbness. EXAM: CT ANGIOGRAPHY HEAD AND NECK CT PERFUSION BRAIN TECHNIQUE: Multidetector CT imaging of the head and neck was performed using  the standard protocol during bolus administration of intravenous contrast. Multiplanar CT image reconstructions and MIPs were obtained to evaluate the vascular anatomy. Carotid stenosis measurements (when applicable) are obtained utilizing NASCET criteria, using the distal internal carotid diameter as the denominator. Multiphase CT imaging of the brain was performed following IV bolus contrast injection. Subsequent parametric perfusion maps were calculated using RAPID software. CONTRAST:  168m ISOVUE-370 IOPAMIDOL (ISOVUE-370) INJECTION 76% COMPARISON:  Prior CT from earlier the same day. Comparison also made with prior CTA from 11/30/2017 and arteriogram from 12/03/2017 FINDINGS: CTA NECK FINDINGS Aortic arch: Visualized aortic arch of normal caliber with normal 3 vessel morphology. No flow-limiting stenosis about the origin of the great vessels. Visualized subclavian arteries widely patent. Right carotid system: Right common carotid artery patent from its origin to the bifurcation without hemodynamically significant stenosis. Concentric mixed plaque about the right bifurcation with associated mild stenosis of up to 35% by NASCET criteria. Right ICA otherwise widely patent to the skull base without stenosis, dissection, or occlusion. Left carotid system: Left common carotid artery patent from its origin to the bifurcation without stenosis. Mixed but predominantly soft plaque at the left bifurcation/proximal left ICA with associated stenosis of up to 60% by NASCET criteria (series 8, image 148). Left ICA widely patent distally to the skull  base without additional stenosis, dissection, or occlusion. Vertebral arteries: Both of the vertebral arteries arise from the subclavian arteries. Mild narrowing at the origin of the left vertebral artery. Vertebral arteries otherwise widely patent within the neck without stenosis, dissection, or occlusion. Skeleton: No acute osseous abnormality. No discrete lytic or blastic  osseous lesions. 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 Poor dentition with prominent dental carie at the left first mandibular molar. Other neck: No other acute soft tissue abnormality within the neck. Upper chest: Visualized upper chest demonstrates no acute finding. Partially visualized lungs are grossly clear. Mild upper lobe predominant paraseptal emphysema. Review of the MIP images confirms the above findings CTA HEAD FINDINGS Anterior circulation: Internal carotid arteries widely patent to the termini without hemodynamically significant stenosis. ICA termini well perfused and symmetric. A1 segments widely patent bilaterally. Normal anterior communicating artery. Anterior cerebral arteries widely patent to their distal aspects without stenosis. No M1 stenosis or occlusion. Normal MCA bifurcations. Distal MCA branches well perfused and symmetric. Posterior circulation: Vertebral arteries are co dominant and widely patent as they course into the cranial vault. Severe near occlusive stenosis at the distal left V4 segment just prior to the vertebrobasilar junction (series 7, image 151). Right PICA patent proximally. Moderate multifocal atheromatous irregularity within the proximal left V4 segment with up to moderate approximate 50% stenosis. Vascular stent in place distally within the left V4 segment, extending distally to the proximal-mid basilar artery (series 7, image 149-proximal aspect, series 7 image 128-distal aspect). Possible stenoses at the stent margins difficult to evaluate given adjacent stent markers. There is wide patency of the stent through the distal left vertebral artery and proximal basilar artery, markedly improved relative to prior CTA. Attenuated but patent flow within the proximal-mid basilar artery distal to the takeoff of the anterior inferior cerebellar arteries, with approximate moderate diffuse narrowing (series 8, image 123 coronal, series 7, image 133 axial).  Basilar moderately narrowed just distal to the stent by approximately 50-60%, likely similar to previous CTA (series 7, image 124). Superior cerebellar arteries irregular but patent at their origins. Both of the PCAs appear to be primarily supplied via the basilar. Extensive atheromatous irregularity with short-segment moderate to severe proximal P1 stenoses (series 8, image 115 on the left, image 116 on the right). PCAs are patent distally but attenuated with extensive atheromatous irregularity. Venous sinuses: Patent. Anatomic variants: None significant. Delayed phase: No pathologic enhancement. Chronic left cerebellar infarcts noted. Review of the MIP images confirms the above findings CT Brain Perfusion Findings: CBF (<30%) Volume: 75m Perfusion (Tmax>6.0s) volume: 119mMismatch Volume: 1156mnfarction Location:Negative CT perfusion for core infarct. Patchy perfusion deficit at the mid cerebellar region, likely related to right V4 stenosis and possibly delayed flow through the stent. IMPRESSION: 1. Negative CTA for large vessel occlusion. 2. No evidence for core infarct by CT perfusion. Mild perfusion deficit within the posterior fossa likely related to bilateral V4 stenoses. 3. 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 Widely patent flow through the proximal and mid aspect of the stent. Attenuated flow with up to approximate 50% narrowing through the distal aspect of the stent, distal to the anterior inferior cerebral arteries. Basilar artery is patent distally, with additional moderate to advanced atheromatous irregularity involving the SCAs and PCAs bilaterally. 4. 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 5. Mild 35% atheromatous stenosis at the origin of the right ICA. 6. Widely patent anterior circulation within the  head. Critical Value/emergent results were called by telephone at the time of interpretation on 02/08/2018 at 11:00 pm to  Dr. Gerlene Fee , who verbally acknowledged these results. Electronically Signed   By: Jeannine Boga M.D.   On: 02/08/2018 23:33   Ct Angio Neck W And/or Wo Contrast  Result Date: 02/08/2018 CLINICAL DATA:  Initial evaluation for acute right facial numbness. EXAM: CT ANGIOGRAPHY HEAD AND NECK CT PERFUSION BRAIN TECHNIQUE: Multidetector CT imaging of the head and neck was performed using the standard protocol during bolus administration of intravenous contrast. Multiplanar CT image reconstructions and MIPs were obtained to evaluate the vascular anatomy. Carotid stenosis measurements (when applicable) are obtained utilizing NASCET criteria, using the distal internal carotid diameter as the denominator. Multiphase CT imaging of the brain was performed following IV bolus contrast injection. Subsequent parametric perfusion maps were calculated using RAPID software. CONTRAST:  1104m ISOVUE-370 IOPAMIDOL (ISOVUE-370) INJECTION 76% COMPARISON:  Prior CT from earlier the same day. Comparison also made with prior CTA from 11/30/2017 and arteriogram from 12/03/2017 FINDINGS: CTA NECK FINDINGS Aortic arch: Visualized aortic arch of normal caliber with normal 3 vessel morphology. No flow-limiting stenosis about the origin of the great vessels. Visualized subclavian arteries widely patent. Right carotid system: Right common carotid artery patent from its origin to the bifurcation without hemodynamically significant stenosis. Concentric mixed plaque about the right bifurcation with associated mild stenosis of up to 35% by NASCET criteria. Right ICA otherwise widely patent to the skull base without stenosis, dissection, or occlusion. Left carotid system: Left common carotid artery patent from its origin to the bifurcation without stenosis. Mixed but predominantly soft plaque at the left bifurcation/proximal left ICA with associated stenosis of up to 60% by NASCET criteria (series 8, image 148). Left ICA widely patent  distally to the skull base without additional stenosis, dissection, or occlusion. Vertebral arteries: Both of the vertebral arteries arise from the subclavian arteries. Mild narrowing at the origin of the left vertebral artery. Vertebral arteries otherwise widely patent within the neck without stenosis, dissection, or occlusion. Skeleton: No acute osseous abnormality. No discrete lytic or blastic osseous lesions. 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 Poor dentition with prominent dental carie at the left first mandibular molar. Other neck: No other acute soft tissue abnormality within the neck. Upper chest: Visualized upper chest demonstrates no acute finding. Partially visualized lungs are grossly clear. Mild upper lobe predominant paraseptal emphysema. Review of the MIP images confirms the above findings CTA HEAD FINDINGS Anterior circulation: Internal carotid arteries widely patent to the termini without hemodynamically significant stenosis. ICA termini well perfused and symmetric. A1 segments widely patent bilaterally. Normal anterior communicating artery. Anterior cerebral arteries widely patent to their distal aspects without stenosis. No M1 stenosis or occlusion. Normal MCA bifurcations. Distal MCA branches well perfused and symmetric. Posterior circulation: Vertebral arteries are co dominant and widely patent as they course into the cranial vault. Severe near occlusive stenosis at the distal left V4 segment just prior to the vertebrobasilar junction (series 7, image 151). Right PICA patent proximally. Moderate multifocal atheromatous irregularity within the proximal left V4 segment with up to moderate approximate 50% stenosis. Vascular stent in place distally within the left V4 segment, extending distally to the proximal-mid basilar artery (series 7, image 149-proximal aspect, series 7 image 128-distal aspect). Possible stenoses at the stent margins difficult to evaluate given adjacent  stent markers. There is wide patency of the stent through the distal left vertebral artery and proximal basilar artery, markedly  improved relative to prior CTA. Attenuated but patent flow within the proximal-mid basilar artery distal to the takeoff of the anterior inferior cerebellar arteries, with approximate moderate diffuse narrowing (series 8, image 123 coronal, series 7, image 133 axial). Basilar moderately narrowed just distal to the stent by approximately 50-60%, likely similar to previous CTA (series 7, image 124). Superior cerebellar arteries irregular but patent at their origins. Both of the PCAs appear to be primarily supplied via the basilar. Extensive atheromatous irregularity with short-segment moderate to severe proximal P1 stenoses (series 8, image 115 on the left, image 116 on the right). PCAs are patent distally but attenuated with extensive atheromatous irregularity. Venous sinuses: Patent. Anatomic variants: None significant. Delayed phase: No pathologic enhancement. Chronic left cerebellar infarcts noted. Review of the MIP images confirms the above findings CT Brain Perfusion Findings: CBF (<30%) Volume: 71m Perfusion (Tmax>6.0s) volume: 175mMismatch Volume: 117mnfarction Location:Negative CT perfusion for core infarct. Patchy perfusion deficit at the mid cerebellar region, likely related to right V4 stenosis and possibly delayed flow through the stent. IMPRESSION: 1. Negative CTA for large vessel occlusion. 2. No evidence for core infarct by CT perfusion. Mild perfusion deficit within the posterior fossa likely related to bilateral V4 stenoses. 3. 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 Widely patent flow through the proximal and mid aspect of the stent. Attenuated flow with up to approximate 50% narrowing through the distal aspect of the stent, distal to the anterior inferior cerebral arteries. Basilar artery is patent distally, with  additional moderate to advanced atheromatous irregularity involving the SCAs and PCAs bilaterally. 4. 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 5. Mild 35% atheromatous stenosis at the origin of the right ICA. 6. Widely patent anterior circulation within the head. Critical Value/emergent results were called by telephone at the time of interpretation on 02/08/2018 at 11:00 pm to Dr. MICGerlene Feewho verbally acknowledged these results. Electronically Signed   By: BenJeannine BogaD.   On: 02/08/2018 23:33   Mr Brain Wo Contrast  Result Date: 02/09/2018 CLINICAL DATA:  Initial evaluation for acute headache with right-sided facial numbness. EXAM: MRI HEAD WITHOUT CONTRAST TECHNIQUE: Multiplanar, multiecho pulse sequences of the brain and surrounding structures were obtained without intravenous contrast. COMPARISON:  Prior CT and CTA from 02/08/2018 FINDINGS: Brain: Generalized age-related cerebral atrophy. Mild chronic microvascular ischemic disease noted involving the pons. Chronic left cerebellar infarct. Single 5 mm focus of diffusion abnormality seen within the right paramedian pons (series 5, image 66), suspicious for a small acute to early subacute ischemic infarct. No associated hemorrhage. No other evidence for acute or subacute ischemia. Gray-white matter differentiation otherwise maintained. No other areas of chronic cortical infarction. No acute intracranial hemorrhage. Small amount of chronic hemosiderin staining noted about the chronic left cerebellar infarct. No mass lesion, midline shift or mass effect. No hydrocephalus. No extra-axial fluid collection. Pituitary gland within normal limits. 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 Major intracranial vascular flow voids otherwise maintained. Skull and upper cervical spine: Craniocervical junction within normal limits.  Bone marrow signal intensity normal. No scalp soft tissue abnormality. Sinuses/Orbits: Globes and orbital soft tissues within normal limits. Scattered mucosal thickening and opacity within the ethmoidal air cells. Paranasal sinuses are otherwise clear. No significant mastoid effusion. Inner ear structures grossly normal. Other: None. IMPRESSION: 1. Punctate 5 mm focus  of diffusion abnormality involving the right paramedian pons, suspicious for a small acute to early subacute small vessel type infarct. No associated hemorrhage. 2. No other acute intracranial abnormality. 3. Small chronic left cerebellar infarct. Electronically Signed   By: Jeannine Boga M.D.   On: 02/09/2018 05:44   Ct Cerebral Perfusion W Contrast  Result Date: 02/08/2018 CLINICAL DATA:  Initial evaluation for acute right facial numbness. EXAM: CT ANGIOGRAPHY HEAD AND NECK CT PERFUSION BRAIN TECHNIQUE: Multidetector CT imaging of the head and neck was performed using the standard protocol during bolus administration of intravenous contrast. Multiplanar CT image reconstructions and MIPs were obtained to evaluate the vascular anatomy. Carotid stenosis measurements (when applicable) are obtained utilizing NASCET criteria, using the distal internal carotid diameter as the denominator. Multiphase CT imaging of the brain was performed following IV bolus contrast injection. Subsequent parametric perfusion maps were calculated using RAPID software. CONTRAST:  139m ISOVUE-370 IOPAMIDOL (ISOVUE-370) INJECTION 76% COMPARISON:  Prior CT from earlier the same day. Comparison also made with prior CTA from 11/30/2017 and arteriogram from 12/03/2017 FINDINGS: CTA NECK FINDINGS Aortic arch: Visualized aortic arch of normal caliber with normal 3 vessel morphology. No flow-limiting stenosis about the origin of the great vessels. Visualized subclavian arteries widely patent. Right carotid system: Right common carotid artery patent from its origin to the  bifurcation without hemodynamically significant stenosis. Concentric mixed plaque about the right bifurcation with associated mild stenosis of up to 35% by NASCET criteria. Right ICA otherwise widely patent to the skull base without stenosis, dissection, or occlusion. Left carotid system: Left common carotid artery patent from its origin to the bifurcation without stenosis. Mixed but predominantly soft plaque at the left bifurcation/proximal left ICA with associated stenosis of up to 60% by NASCET criteria (series 8, image 148). Left ICA widely patent distally to the skull base without additional stenosis, dissection, or occlusion. Vertebral arteries: Both of the vertebral arteries arise from the subclavian arteries. Mild narrowing at the origin of the left vertebral artery. Vertebral arteries otherwise widely patent within the neck without stenosis, dissection, or occlusion. Skeleton: No acute osseous abnormality. No discrete lytic or blastic osseous lesions. 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 Poor dentition with prominent dental carie at the left first mandibular molar. Other neck: No other acute soft tissue abnormality within the neck. Upper chest: Visualized upper chest demonstrates no acute finding. Partially visualized lungs are grossly clear. Mild upper lobe predominant paraseptal emphysema. Review of the MIP images confirms the above findings CTA HEAD FINDINGS Anterior circulation: Internal carotid arteries widely patent to the termini without hemodynamically significant stenosis. ICA termini well perfused and symmetric. A1 segments widely patent bilaterally. Normal anterior communicating artery. Anterior cerebral arteries widely patent to their distal aspects without stenosis. No M1 stenosis or occlusion. Normal MCA bifurcations. Distal MCA branches well perfused and symmetric. Posterior circulation: Vertebral arteries are co dominant and widely patent as they course into the cranial  vault. Severe near occlusive stenosis at the distal left V4 segment just prior to the vertebrobasilar junction (series 7, image 151). Right PICA patent proximally. Moderate multifocal atheromatous irregularity within the proximal left V4 segment with up to moderate approximate 50% stenosis. Vascular stent in place distally within the left V4 segment, extending distally to the proximal-mid basilar artery (series 7, image 149-proximal aspect, series 7 image 128-distal aspect). Possible stenoses at the stent margins difficult to evaluate given adjacent stent markers. There is wide patency of the stent through the distal left vertebral artery and  proximal basilar artery, markedly improved relative to prior CTA. Attenuated but patent flow within the proximal-mid basilar artery distal to the takeoff of the anterior inferior cerebellar arteries, with approximate moderate diffuse narrowing (series 8, image 123 coronal, series 7, image 133 axial). Basilar moderately narrowed just distal to the stent by approximately 50-60%, likely similar to previous CTA (series 7, image 124). Superior cerebellar arteries irregular but patent at their origins. Both of the PCAs appear to be primarily supplied via the basilar. Extensive atheromatous irregularity with short-segment moderate to severe proximal P1 stenoses (series 8, image 115 on the left, image 116 on the right). PCAs are patent distally but attenuated with extensive atheromatous irregularity. Venous sinuses: Patent. Anatomic variants: None significant. Delayed phase: No pathologic enhancement. Chronic left cerebellar infarcts noted. Review of the MIP images confirms the above findings CT Brain Perfusion Findings: CBF (<30%) Volume: 53m Perfusion (Tmax>6.0s) volume: 144mMismatch Volume: 119mnfarction Location:Negative CT perfusion for core infarct. Patchy perfusion deficit at the mid cerebellar region, likely related to right V4 stenosis and possibly delayed flow through the  stent. IMPRESSION: 1. Negative CTA for large vessel occlusion. 2. No evidence for core infarct by CT perfusion. Mild perfusion deficit within the posterior fossa likely related to bilateral V4 stenoses. 3. 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 Widely patent flow through the proximal and mid aspect of the stent. Attenuated flow with up to approximate 50% narrowing through the distal aspect of the stent, distal to the anterior inferior cerebral arteries. Basilar artery is patent distally, with additional moderate to advanced atheromatous irregularity involving the SCAs and PCAs bilaterally. 4. 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 5. Mild 35% atheromatous stenosis at the origin of the right ICA. 6. Widely patent anterior circulation within the head. Critical Value/emergent results were called by telephone at the time of interpretation on 02/08/2018 at 11:00 pm to Dr. MICGerlene Feewho verbally acknowledged these results. Electronically Signed   By: BenJeannine BogaD.   On: 02/08/2018 23:33   Ct Head Code Stroke Wo Contrast  Result Date: 02/08/2018 CLINICAL DATA:  Code stroke.  Right-sided facial numbness EXAM: CT HEAD WITHOUT CONTRAST TECHNIQUE: Contiguous axial images were obtained from the base of the skull through the vertex without intravenous contrast. COMPARISON:  None. FINDINGS: Brain: There is no mass, hemorrhage or extra-axial collection. The size and configuration of the ventricles and extra-axial CSF spaces are normal. There is hypoattenuation of the periventricular white matter, most commonly indicating chronic ischemic microangiopathy. Vascular: No abnormal hyperdensity of the major intracranial arteries or dural venous sinuses. No intracranial atherosclerosis. Skull: Right parietal scalp hematoma.  No skull fracture. Sinuses/Orbits: No fluid levels or advanced mucosal thickening of the visualized paranasal  sinuses. No mastoid or middle ear effusion. The orbits are normal. ASPECTS ([facility] Early CT Score) - Ganglionic level infarction (caudate, lentiform nuclei, internal capsule, insula, M1-M3 cortex): 7 - Supraganglionic infarction (M4-M6 cortex): 3 Total score (0-10 with 10 being normal): 10 IMPRESSION: 1. No acute intracranial abnormality. 2. ASPECTS is 10. 3. Right parietal scalp hematoma without skull fracture. * These results were called by telephone at the time of interpretation on 02/08/2018 at 8:58 pm to Dr. MICGerlene Feewho verbally acknowledged these results. Electronically Signed   By: KevUlyses JarredD.   On: 02/08/2018 20:59      LAB RESULTS: Basic Metabolic Panel: Recent Labs  Lab 02/10/18 0555 02/12/18  0355  NA 139 138  K 4.1 3.8  CL 104 102  CO2 25 25  GLUCOSE 142* 119*  BUN 16 14  CREATININE 1.13 1.24  CALCIUM 9.2 9.3   Liver Function Tests: Recent Labs  Lab 02/08/18 2104  AST 16  ALT 15  ALKPHOS 63  BILITOT 0.4  PROT 8.6*  ALBUMIN 4.5   No results for input(s): LIPASE, AMYLASE in the last 168 hours. No results for input(s): AMMONIA in the last 168 hours. CBC: Recent Labs  Lab 02/10/18 0555 02/12/18 0355  WBC 8.8 9.8  NEUTROABS  --  4.8  HGB 13.1 13.1  HCT 42.4 41.7  MCV 90.2 88.9  PLT 323 323   Cardiac Enzymes: No results for input(s): CKTOTAL, CKMB, CKMBINDEX, TROPONINI in the last 168 hours. BNP: Invalid input(s): POCBNP CBG: Recent Labs  Lab 02/13/18 0631 02/13/18 1149  GLUCAP 223* 126*      Disposition and Follow-up: Discharge Instructions    Diet Carb Modified   Complete by:  As directed    Increase activity slowly   Complete by:  As directed        DISPOSITION: Home   DISCHARGE FOLLOW-UP Follow-up Information    Rosalin Hawking, MD. Schedule an appointment as soon as possible for a visit in 4 week(s).   Specialty:  Neurology Contact information: [address] [place]  [phone]        [facility]. Schedule an appointment as soon as possible for a visit in 2 week(s).   Contact information: [place] [address] [phone]        Luanne Bras, MD. Schedule an appointment as soon as possible for a visit in 1 week(s).   Specialties:  Interventional Radiology, Radiology Contact information: [place] [address] [phone]            Time coordinating discharge:  35-minutes  Signed:   Estill Cotta M.D. [facility] 02/13/2018, 3:06 PM Pager: [phone]

## 2018-02-13 NOTE — Care Management Note (Signed)
Case Management Note  Patient Details  Name: Devin [name] MRN: [id number] Date of Birth: 1970/03/20  Subjective/Objective:                    Action/Plan: Pt discharging home with self care. Pt asked that [facility]. CM faxed the prescriptions and called the HD.  Sister to provide support at home and transportation to home.   Expected Discharge Date:  02/13/18               Expected Discharge Plan:  Home/Self Care  In-House Referral:     Discharge planning Services  CM Consult  Post Acute Care Choice:    Choice offered to:     DME Arranged:    DME Agency:     HH Arranged:    HH Agency:     Status of Service:  Completed, signed off  If discussed at [employer] Length of Stay Meetings, dates discussed:    Additional Comments:  Kermit BaloKelli F Ramello Cordial, RN 02/13/2018, 3:40 PM

## 2018-02-13 NOTE — Progress Notes (Signed)
Patient discharged home with sister who is also an Charity fundraiserN. Patient removed IV himself. Site clean, dry and intact. Angio site remains unremarkable. Pulse strong.  Discharge information given to patient. Questions asked and answered.  Patient politely declined staff accompaniment to car, preferring to walk with sister who is his transportation home. Patient will take prescription for fenofibrate to [facility].  Lawson RadarHeather M Geanine Vandekamp

## 2018-02-15 ENCOUNTER — Other Ambulatory Visit (HOSPITAL_COMMUNITY): Payer: Self-pay | Admitting: Interventional Radiology

## 2018-02-15 ENCOUNTER — Encounter (HOSPITAL_COMMUNITY): Payer: Self-pay | Admitting: Interventional Radiology

## 2018-02-15 DIAGNOSIS — I771 Stricture of artery: Secondary | ICD-10-CM

## 2018-02-18 ENCOUNTER — Other Ambulatory Visit (HOSPITAL_COMMUNITY): Payer: Self-pay | Admitting: Neurology

## 2018-02-18 ENCOUNTER — Encounter (HOSPITAL_COMMUNITY): Payer: Self-pay | Admitting: Radiology

## 2018-02-18 DIAGNOSIS — I639 Cerebral infarction, unspecified: Secondary | ICD-10-CM

## 2018-02-19 ENCOUNTER — Telehealth: Payer: Self-pay | Admitting: *Deleted

## 2018-02-19 NOTE — Telephone Encounter (Signed)
Zetia and fenofibrate are fine in combiation. Fenofibrate is used less and less however with his severity of high triglyrecerides this is a case where it would be used   Dominga FerryJ Thoren Hosang MD

## 2018-02-19 NOTE — Telephone Encounter (Signed)
[facility] from [facility] requesting Dr Tyron RussellBranch's recs on pt being on both fenofibrate and Zetia - says pt was in ED 11/29 and was concerned with pt taking both medications and if pt should alternate meds

## 2018-02-19 NOTE — Telephone Encounter (Signed)
Left detailed message on nurse triage line - unable to speak to anyone

## 2018-02-21 ENCOUNTER — Encounter (HOSPITAL_COMMUNITY): Payer: Self-pay | Admitting: *Deleted

## 2018-02-21 ENCOUNTER — Other Ambulatory Visit: Payer: Self-pay | Admitting: *Deleted

## 2018-02-21 NOTE — Telephone Encounter (Signed)
This encounter was created in error - please disregard.

## 2018-02-21 NOTE — Patient Outreach (Signed)
  [facility] Care Management  02/21/2018  Varney DailyCarl L Marandola [birth year] [id number]   EMMI- stroke  RED ON EMMI ALERT Day # 6 Date: Wednesday 1211/19 1610  Red Alert Reason: Smoked or been around smoke? yes   Insurance: medicaid potential  [facility] admissions x 4 Last one on 02/08/18 ED visits in the last 6 months    Outreach attempt # 1 No answer at home number unable to leave a message related to his voice mail box is full  Called his DPR, sister , Rayetta PiggCathy [facility] RN CM left HIPAA compliant voicemail message along with CM's contact info.   Plan: [facility] RN CM sent an unsuccessful outreach letter and scheduled this patient for another call attempt within 4 business days  Kaevon Cotta L. Noelle PennerGibbs, RN, BSN, CCM [facility] Telephonic Care Management Care Coordinator Office number [phone] Mobile number [phone]  Main [facility] number [phone] Fax number [phone]

## 2018-02-22 ENCOUNTER — Other Ambulatory Visit: Payer: Self-pay | Admitting: *Deleted

## 2018-02-22 ENCOUNTER — Other Ambulatory Visit: Payer: Self-pay

## 2018-02-22 ENCOUNTER — Other Ambulatory Visit: Payer: Self-pay | Admitting: Radiology

## 2018-02-22 ENCOUNTER — Encounter (HOSPITAL_COMMUNITY): Payer: Self-pay | Admitting: *Deleted

## 2018-02-22 MED ORDER — DEXTROSE 5 % IV SOLN
3.0000 g | INTRAVENOUS | Status: AC
  Administered 2018-02-25: 3 g via INTRAVENOUS
  Filled 2018-02-22: qty 3

## 2018-02-22 NOTE — Anesthesia Preprocedure Evaluation (Addendum)
Anesthesia Evaluation  Patient identified by MRN, date of birth, ID band Patient awake    Reviewed: Allergy & Precautions, NPO status , Patient's Chart, lab work & pertinent test results  History of Anesthesia Complications (+) PONV  Airway Mallampati: II       Dental  (+) Poor Dentition   Pulmonary Current Smoker,    Pulmonary exam normal        Cardiovascular hypertension, Pt. on medications + CAD, + Cardiac Stents and +CHF  Normal cardiovascular exam Rhythm:Regular Rate:Normal     Neuro/Psych CVA, No Residual Symptoms    GI/Hepatic GERD  Medicated and Controlled,  Endo/Other  diabetes, Type 2Hypothyroidism   Renal/GU      Musculoskeletal   Abdominal (+) + obese,   Peds  Hematology   Anesthesia Other Findings Result status: Final result                             *Devin [name]*                         [address], [place] [address]   There is moderate left ventricular systolic dysfunction. The left ventricular ejection fraction is 35-45% by visual estimate.  LV end diastolic pressure is mildly elevated.  -----------------------------------------  ANGIOGRAPHY- PCI  LESION #1: Mid LAD lesion is 80% stenosed.  A drug-eluting stent was successfully placed using a STENT SYNERGY DES 2.25X16. -Postdilated to 2.4 mm  Post intervention, there is a 0% residual stenosis.  LESION #2: mid LAD to Dist LAD lesion is 90% stenosed.  A drug-eluting stent was successfully placed using a STENT SYNERGY DES 2.25X12. -Postdilated to 2.4 mm  Post intervention, there is a 0% residual stenosis.  Prox LAD lesion is 40% stenosed -prior to lesion 1; apical, after lesion 2  A drug-eluting stent was successfully placed using a STENT SYNERGY DES 2.25X12. -Postdilated to 2.4 mm  -----------------------------------------  Post intervention,  there is a 0% residual stenosis.  Trifurcation 90% lesion at the distal circumflex into LPL and PDA -small caliber distal vessels. Best treated medically as this is not a very good PCI target  Prox small caliber nondominant RCA lesion is 99% stenosed.   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   Subtotally occluded nondominant RCA Moderately reduced LVEF of roughly 40% with mildly elevated LVEDP.  Plan: Transfer to postprocedure unit for post PCI monitoring and TR removal Continue aggressive risk factor modification with statin and beta-blocker etc.  Recommend uninterrupted dual antiplatelet therapy with Aspirin 69m daily and Ticagrelor 98mtwice daily for a minimum of 12 months (ACS - Class I recommendation). -Would strongly consider reducing to 60 mg dose ticagrelor at 9-12 months and continue for an additional year.   Would be okay to stop aspirin after 3 months if necessary.  Okay to stop Brilinta after 6 months for procedures if necessary.   DaGlenetta HewM.D., M.S. Interventional Cardiologist   Pager # [phone] # [phone] [place] [address]  Indications   Abnormal cardiac CT angiography (R93.1 (ICD-10-CM)) Stable angina (HCC) (I20.8 (ICD-10-CM)) Procedural Details   Technical Details PCP: Patient, No Pcp Per CARDIOLOGIST: Dr. Radford Pax  47 y.o. male obese male with a remote history of minimal CAD by cath in 2014, hypertension, hyperlipidemia, diabetes, ongoing tobacco abuse, GERD admitted with CP. He was evaluated with a coronary CT angiogram which was read as high risk with LAD lesions. He now presents for invasive evaluation with cardiac catheterization/PCI.  Time Out: Verified patient identification, verified procedure, site/side was marked, verified correct patient position, special  equipment/implants available, medications/allergies/relevent history reviewed, required imaging and test results available. Performed. Consent Signed.   Access:  * RIGHT Radial Artery: 6 Fr sheath -- Seldinger technique using Angiocath Micropuncture Kit * 10 mL radial cocktail IA; 8000 Units IV Heparin  Left Heart Catheterization: 5Fr Catheters advanced or exchanged over a J-wire under direct fluoroscopic guidance into the ascending aorta; TIG 4.0 catheter advanced first.  * Left Coronary Artery Cineangiography: TIG 4.0 Catheter  * Right Coronary Artery Cineangiography: TIG 4.0 Catheter  * LV Hemodynamics (LV Gram): Angled Pigtail Catheter   Review of angiography revealed several lesions, but PCI targets include lesions in the LAD and OM 2. Preparations were made for PCI of these lesions. -Angiomax bolus and drip was used for PCI and 180 mg oral Brilinta was administered (the patient is diabetic with multivessel disease)  PCI of LAD lesions followed by distal OM 2 performed: See FINDINGS  - After completion of post PCI angiography, the catheter was removed completely out of the body over wire without complication.  Radial sheath(s) removed in the Cath Lab with TR band placed for hemostasis.   TR Band: 1720 Hours; 16 mL air  MEDICATIONS * SQ Lidocaine 34m * Radial Cocktail: 3 mg Verapamil in 10 mL NS * Isovue Contrast: 280 mL * Heparin: 8000 units * Brilinta 100 mg p.o. * Adjuvants bolus and drip. Drip discontinued following PCI * IC NTG 200 g x 1  Radiation: Fluoroscopy time 23.69   Estimated blood loss <50 mL.  During this procedure the patient was administered the following to achieve and maintain moderate conscious sedation: Versed 1 mg, Fentanyl 25 mcg, while the patient's heart rate, blood pressure, and oxygen saturation were continuously monitored. The period of conscious sedation was 95 minutes, of which I was present face-to-face 100% of this time. Medications  (Filter:  Administrations occurring from 11/15/17 1520 to 11/15/17 1729)  Medication Rate/Dose/Volume Action  Date Time  fentaNYL (SUBLIMAZE) injection (mcg) 25 mcg Given 11/15/17 1539  Total dose as of 02/24/18 1904       25 mcg       midazolam (VERSED) injection (mg) 1 mg Given 11/15/17 1539  Total dose as of 02/24/18 1904       1 mg       Heparin (Porcine) in NaCl 1000-0.9 UT/500ML-% SOLN (mL) 500 mL Given 11/15/17 1539  Total dose as of 02/24/18 1904 500 mL Given 1540  1,000 mL       heparin injection (Units) 8,000 Units Given 11/15/17 1550  Total dose as of 02/24/18 1904       8,000 Units       Radial Cocktail/Verapamil only (mL) 10 mL Given 11/15/17 1553  Total dose as of 02/24/18 1904       10 mL       bivalirudin (ANGIOMAX) BOLUS via infusion (mg/kg) 99 mg Given 11/15/17 1610  Dosing weight:  132 kg       Total dose as of 02/24/18 1904       99 mg       ticagrelor (BRILINTA) tablet (mg) 180 mg Given 11/15/17 1612  Total dose as of 02/24/18 1904       180 mg       bivalirudin (ANGIOMAX) 250 mg in sodium chloride 0.9 % 50 mL (5 mg/mL) infusion (mg/kg/hr) 1.75 mg/kg/hr - 46.2 mL/hr New Bag/Given 11/15/17 1613  Dosing weight:  132 kg  Stopped 1725  Total dose as of 02/24/18 1904       280.92 mg       bivalirudin (ANGIOMAX) 250 mg in sodium chloride 0.9 % 50 mL (5 mg/mL) infusion (mg/kg/hr) 1.75 mg/kg/hr - 46.2 mL/hr New Bag/Given 11/15/17 1630  Dosing weight:  132 kg       Total dose as of 02/24/18 1904       Cannot be calculated       iopamidol (ISOVUE-370) 76 % injection (mL) 280 mL Given 11/15/17 1630  Total dose as of 02/24/18 1904       280 mL       acetaminophen (TYLENOL) tablet 650 mg (mg) *Not included in total MAR Hold 11/15/17 1520  Dosing weight:  136.1 kg       Total dose as of 02/24/18 1904       Cannot be calculated       amLODipine (NORVASC) tablet 2.5 mg (mg) *Not included in total MAR Hold 11/15/17 1520  Dosing weight:  131 kg       Total dose as of 02/24/18  1904       Cannot be calculated       aspirin EC tablet 81 mg (mg) *Not included in total MAR Hold 11/15/17 1520  Dosing weight:  131 kg       Total dose as of 02/24/18 1904       Cannot be calculated       enoxaparin (LOVENOX) injection 40 mg (mg) *Not included in total MAR Hold 11/15/17 1520  Dosing weight:  136.1 kg       Total dose as of 02/24/18 1904       Cannot be calculated       famotidine (PEPCID) tablet 20 mg (mg) *Not included in total MAR Hold 11/15/17 1520  Dosing weight:  136.1 kg       Total dose as of 02/24/18 1904       Cannot be calculated       gi cocktail (Maalox,Lidocaine,Donnatal) (mL) *Not included in total MAR Hold 11/15/17 1520  Dosing weight:  136.1 kg       Total dose as of 02/24/18 1904       Cannot be calculated       insulin aspart (novoLOG) injection 0-15 Units (Units) *Not included in total MAR Hold 11/15/17 1520  Dosing weight:  136.1 kg *Not included in total Automatically Held 1700  Total dose as of 02/24/18 1904       Cannot be calculated       insulin aspart (novoLOG) injection 0-5 Units (Units) *Not included in total MAR Hold 11/15/17 1520  Dosing weight:  136.1 kg       Total dose as of 02/24/18 1904       Cannot be calculated       levothyroxine (SYNTHROID, LEVOTHROID) tablet 125 mcg (mcg) *Not included in total MAR Hold 11/15/17 1520  Dosing weight:  131  kg       Total dose as of 02/24/18 1904       Cannot be calculated       morphine 2 MG/ML injection 2 mg (mg) *Not included in total MAR Hold 11/15/17 1520  Dosing weight:  136.1 kg       Total dose as of 02/24/18 1904       Cannot be calculated       nitroGLYCERIN (NITROGLYN) 2 % ointment 0.5 inch (inch) *Not included in total MAR Hold 11/15/17 1520  Dosing weight:  136.1 kg       Total dose as of 02/24/18 1904       Cannot be calculated       nitroGLYCERIN (NITROSTAT) SL tablet 0.4 mg (mg) *Not included in total MAR Hold 11/15/17 1520  Dosing weight:  131 kg       Total dose as of  02/24/18 1904       Cannot be calculated       ondansetron (ZOFRAN) injection 4 mg (mg) *Not included in total MAR Hold 11/15/17 1520  Dosing weight:  136.1 kg       Total dose as of 02/24/18 1904       Cannot be calculated       Sedation Time   Sedation Time Physician-1: 1 hour 35 minutes 33 seconds Complications   Complications documented before study signed (11/15/2017 5:46 PM EDT)   No complications were associated with this study. Documented by Leonie Man, MD - 11/15/2017 5:28 PM EDT  Coronary Findings   Diagnostic  Dominance: Left  Left Main Vessel is normal in caliber. Left Anterior Descending Vessel is moderate in size. Prox LAD lesion 40% stenosed Prox LAD lesion is 40% stenosed. Mid LAD lesion 80% stenosed Mid LAD lesion is 80% stenosed. The lesion is segmental, eccentric and irregular. Mid LAD to Dist LAD lesion 90% stenosed Mid LAD to Dist LAD lesion is 90% stenosed. The lesion is distal to major branch, focal, eccentric and irregular. Not yet apical Dist LAD lesion 60% stenosed Dist LAD lesion is 60% stenosed. The lesion is located at the bend and focal. First Diagonal Branch Vessel is small in size. Second Diagonal Branch Vessel is small in size. Third Septal Branch Vessel is small in size. Left Circumflex Vessel is large. Dist Cx lesion 90% stenosed with side branch in 2nd LPL 90% stenosed Dist Cx lesion is 90% stenosed with 90% stenosed side branch in 2nd LPL. The lesion is located at the bifurcation and focal. First Obtuse Marginal Branch Vessel is small in size. Second Obtuse Marginal Branch Vessel is moderate in size. 2nd Mrg lesion 90% stenosed 2nd Mrg lesion is 90% stenosed. The lesion is segmental, eccentric and irregular. Left Posterior Descending Artery Vessel is small in size. Ost LPDA to LPDA lesion 90% stenosed Ost LPDA to LPDA lesion is 90% stenosed. First Left Posterolateral Branch Vessel is large in size. Second Left  Posterolateral Branch Vessel is small in size. Right Coronary Artery Vessel is small. There is moderate diffuse disease throughout the vessel. Prox RCA lesion 99% stenosed Prox RCA lesion is 99% stenosed. Intervention   Mid LAD lesion Stent Lesion length: 14 mm. CATH VISTA GUIDE 6FR XBLAD3.5 guide catheter was inserted. Lesion crossed with guidewire using a WIRE ASAHI PROWATER 180CM. Pre-stent angioplasty was performed using a BALLOON SAPPHIRE 2.0X12. Maximum pressure: 8 atm. Inflation time: 20 sec. A drug-eluting stent was successfully placed using a STENT SYNERGY DES 2.25X16. Maximum  pressure: 14 atm. Inflation time: 30 sec. Minimum lumen area: 2.4 mm. Post-stent angioplasty was performed. Maximum pressure: 16 atm. Inflation time: 20 sec. Stent Balloon - High ATM Post-Intervention Lesion Assessment The intervention was successful. Pre-interventional TIMI flow is 3. Post-intervention TIMI flow is 3. Treated lesion length: 16 mm. No complications occurred at this lesion. There is a 0% residual stenosis post intervention. Mid LAD to Dist LAD lesion Stent Lesion length: 10 mm. Pre-stent angioplasty was performed using a BALLOON SAPPHIRE 2.0X12. Maximum pressure: 8 atm. Inflation time: 20 sec. A drug-eluting stent was successfully placed using a STENT SYNERGY DES 2.25X12. Maximum pressure: 12 atm. Inflation time: 30 sec. Minimum lumen area: 2.4 mm. Stent strut is well apposed. Post-stent angioplasty was performed. Maximum pressure: 16 atm. Inflation time: 20 sec. Stent Balloon - High ATM Post-Intervention Lesion Assessment The intervention was successful. Treated lesion length: 12 mm. No complications occurred at this lesion. There is a 0% residual stenosis post intervention. 2nd Mrg lesion Stent Lesion length: 14 mm. CATH VISTA GUIDE 6FR XBLAD3.5 guide catheter was inserted. Lesion crossed with guidewire using a WIRE ASAHI PROWATER 180CM. Pre-stent angioplasty was performed using a BALLOON  SAPPHIRE 2.0X12. Maximum pressure: 10 atm. Inflation time: 20 sec. A drug-eluting stent was successfully placed using a STENT SYNERGY DES 2.25X12. Maximum pressure: 14 atm. Inflation time: 20 sec. Minimum lumen area: 2.4 mm. Stent strut is well apposed. Post-stent angioplasty was performed. Maximum pressure: 16 atm. Inflation time: 20 sec. Stent balloon, high ATM Post-Intervention Lesion Assessment The intervention was successful. Pre-interventional TIMI flow is 2. Post-intervention TIMI flow is 3. Treated lesion length: 16 mm. No complications occurred at this lesion. There is a 0% residual stenosis post intervention. Wall Motion   Resting     Anterolateral hypokinesis     Left Heart   Left Ventricle The left ventricular size is in the upper limits of normal. There is moderate left ventricular systolic dysfunction. LV end diastolic pressure is mildly elevated. The left ventricular ejection fraction is 35-45% by visual estimate. There are LV function abnormalities due to segmental dysfunction and global hypokinesis. There is trivial (1+) mitral regurgitation. Aortic Valve There is no aortic valve stenosis. There is normal aortic valve motion. Coronary Diagrams   Diagnostic  Dominance: Left    Intervention     Implants    Permanent Stent Stent Synergy Des 2.25x12 - [id number] - Implanted     Inventory item: Melissa Noon DES 2.25X12 Model/Cat number: [id number] Manufacturer: Wilkie Aye Lot number: [id number] Device identifier: [id number] Device identifier type: GS1 Area Of Implantation: Dist LAD   GUDID Information    Request status Successful   Brand name: SYNERGY Version/Model: [id number] Company name: [employer] MRI safety info as of 11/15/17: MR Conditional Contains dry or latex rubber: No   GMDN P.T. name: Drug-eluting coronary artery stent, bioabsorbable-polymer-coated   As of 11/15/2017    Status: Implanted    Stent Synergy Des  2.25x16 - [id number] - Implanted     Inventory item: Melissa Noon DES 0.93O67 Model/Cat number: [id number] Manufacturer: Wilkie Aye Lot number: [id number] Device identifier: [id number] Device identifier type: GS1 Area Of Implantation: Mid LAD   GUDID Information    Request status Successful   Brand name: SYNERGY Version/Model: [id number] Company name: [employer] MRI safety info as of 11/15/17: MR Conditional Contains dry or latex rubber: No   GMDN P.T. name: Drug-eluting coronary artery stent, bioabsorbable-polymer-coated   As of 11/15/2017    Status: Implanted  Stent Synergy Des 2.25x16 [id number] - Implanted     Inventory item: STENT SYNERGY DES 2.25X16 Model/Cat number: [id number] Manufacturer: Wilkie Aye Lot number: [id number] Device identifier: [id number] Device identifier type: GS1 Area Of Implantation: 2nd OM   GUDID Information    Request status Successful   Brand name: SYNERGY Version/Model: [id number] Company name: [employer] MRI safety info as of 11/15/17: MR Conditional Contains dry or latex rubber: No   GMDN P.T. name: Drug-eluting coronary artery stent, bioabsorbable-polymer-coated   As of 11/15/2017    Status: Implanted    MERGE Images    Show images for CARDIAC CATHETERIZATION  Link to Procedure Log    Procedure Log  Hemo Data    Most Recent Value AO Systolic Pressure 77 mmHg AO Diastolic Pressure 46 mmHg AO Mean 61 mmHg LV Systolic Pressure 373 mmHg LV Diastolic Pressure 12 mmHg LV EDP 16 mmHg AOp Systolic Pressure 428 mmHg AOp Diastolic Pressure 85 mmHg AOp Mean Pressure 768 mmHg LVp Systolic Pressure 115 mmHg LVp Diastolic Pressure 3 mmHg LVp EDP Pressure 15 mmHg Order-Level Documents - 11/13/2017:   Scan on 11/17/2017 11:41 AM by Default, Provider, MD    Encounter-Level Documents - 11/13/2017:   Scan on 12/28/2017 10:10 AM by Default, Provider, MD  Document on  11/18/2017 12:18 AM by Theador Hawthorne, RN: ED PB Billing Extract  Scan on 11/17/2017 11:12 AM by Default, Provider, MD  Document on 11/16/2017 10:40 AM by Haywood Lasso, RN: IP After Visit Summary  Scan on 11/15/2017 5:26 PM by Default, Provider, MD  Scan on 11/14/2017 9:55 AM by Default, Provider, MD  Scan on 11/15/2017 9:59 PM by Default, Provider, MD  Electronic signature on 11/13/2017 10:50 PM - Signed  Scan on 11/16/2017 9:05 AM by Default, Provider, MD  Electronic signature on 11/13/2017 5:53 PM - Signed    Signed   Electronically signed by Leonie Man, MD on 11/15/17 at 1746 EDT External Result Report   External Result Report                              [phone]  ------------------------------------------------------------------- Transthoracic Echocardiography  Patient:    Devin [name], Devin [name] MR #:       [id number] Study Date: 02/09/2018 Gender:     M Age:        62 Height:     198.1 cm Weight:     122.3 kg BSA:        2.62 m^2 Pt. Status: Room:       3W20C   PERFORMING   [facility], Inpatient  ADMITTING    Opyd, Ilene Qua  ORDERING     Opyd, Timothy S  REFERRING    Opyd, Timothy S  SONOGRAPHER  Cardell Peach, RDCS  ATTENDING    Maudie Flakes  cc:  ------------------------------------------------------------------- LV EF: 35% -   40%  ------------------------------------------------------------------- History:   PMH:   Coronary artery disease.  Stroke.  Transient ischemic attack.  Risk factors:  Hypertension. Diabetes mellitus. Dyslipidemia.  ------------------------------------------------------------------- Study Conclusions  - Left ventricle: The cavity size was normal. There was mild   concentric hypertrophy. Systolic function was moderately reduced.   The estimated ejection fraction was in the range of 35% to 40%.   Diffuse hypokinesis. Doppler parameters are consistent with   abnormal left ventricular relaxation (grade 1 diastolic   dysfunction). -  Mitral valve: There was mild regurgitation. - Pericardium, extracardiac: A  trivial pericardial effusion was   identified along the right ventricular free wall.  Impressions:  - Compared to the prior study, there has been no significant   interval change.  ------------------------------------------------------------------- Study data:  Comparison was made to the study of 11/23/2017.  Study status:  Routine.  Procedure:  The patient reported no pain pre or post test. Transthoracic echocardiography. Image quality was adequate.  Study completion:  There were no complications. Transthoracic echocardiography.  M-mode, complete 2D, spectral Doppler, and color Doppler.  Birthdate:  Patient birthdate: [birth year].  Age:  Patient is 47 yr old.  Sex:  Gender: male. BMI: 31.2 kg/m^2.  Blood pressure:     103/66  Patient status: Inpatient.  Study date:  Study date: 02/09/2018. Study time: 08:27 AM.  Location:  Bedside.  -------------------------------------------------------------------  ------------------------------------------------------------------- Left ventricle:  The cavity size was normal. There was mild concentric hypertrophy. Systolic function was moderately reduced. The estimated ejection fraction was in the range of 35% to 40%. Diffuse hypokinesis. Doppler parameters are consistent with abnormal left ventricular relaxation (grade 1 diastolic dysfunction).  ------------------------------------------------------------------- Aortic valve:   Trileaflet; normal thickness leaflets. Mobility was not restricted.  Doppler:  Transvalvular velocity was within the normal range. There was no stenosis. There was no regurgitation.   ------------------------------------------------------------------- Aorta:  Aortic root: The aortic root was normal in size.  ------------------------------------------------------------------- Mitral valve:   Structurally normal valve.   Mobility was  not restricted.  Doppler:  Transvalvular velocity was within the normal range. There was no evidence for stenosis. There was mild regurgitation.    Valve area by pressure half-time: 2.59 cm^2. Indexed valve area by pressure half-time: 0.99 cm^2/m^2.    Peak gradient (D): 3 mm Hg.  ------------------------------------------------------------------- Left atrium:  The atrium was normal in size.  ------------------------------------------------------------------- Right ventricle:  The cavity size was normal. Wall thickness was normal. Systolic function was normal.  ------------------------------------------------------------------- Pulmonic valve:    Structurally normal valve.   Cusp separation was normal.  Doppler:  Transvalvular velocity was within the normal range. There was no evidence for stenosis. There was no regurgitation.  ------------------------------------------------------------------- Tricuspid valve:   Structurally normal valve.    Doppler: Transvalvular velocity was within the normal range. There was no regurgitation.  ------------------------------------------------------------------- Pulmonary artery:   The main pulmonary artery was normal-sized. Systolic pressure was within the normal range.  ------------------------------------------------------------------- Right atrium:  The atrium was normal in size.  ------------------------------------------------------------------- Pericardium:  A trivial pericardial effusion was identified along the right ventricular free wall.  ------------------------------------------------------------------- Systemic veins: Inferior vena cava: The vessel was normal in size.  ------------------------------------------------------------------- Measurements   Left ventricle                         Value          Reference  LV ID, ED, PLAX chordal        (H)     56    mm       43 - 52  LV ID, ES, PLAX chordal        (H)      43    mm       23 - 38  LV fx shortening, PLAX chordal (L)     23    %        >=29  LV PW thickness, ED                    13  mm       ----------  IVS/LV PW ratio, ED                    1              <=1.3  Stroke volume, 2D                      76    ml       ----------  Stroke volume/bsa, 2D                  29    ml/m^2   ----------  LV e&', lateral                         9.36  cm/s     ----------  LV E/e&', lateral                       9.33           ----------  LV e&', medial                          5.66  cm/s     ----------  LV E/e&', medial                        15.42          ----------  LV e&', average                         7.51  cm/s     ----------  LV E/e&', average                       11.62          ----------    Ventricular septum                     Value          Reference  IVS thickness, ED                      13    mm       ----------    LVOT                                   Value          Reference  LVOT ID, S                             24    mm       ----------  LVOT area                              4.52  cm^2     ----------  LVOT peak velocity, S                  82    cm/s     ----------  LVOT mean velocity, S                  57.7  cm/s     ----------  LVOT VTI, S                            16.8  cm       ----------  LVOT peak gradient, S                  3     mm Hg    ----------    Aorta                                  Value          Reference  Ascending aorta ID, A-P, S             36    mm       ----------    Left atrium                            Value          Reference  LA ID, A-P, ES                         38    mm       ----------  LA ID/bsa, A-P                         1.45  cm/m^2   <=2.2  LA volume, S                           46.9  ml       ----------  LA volume/bsa, S                       17.9  ml/m^2   ----------  LA volume, ES, 1-p A4C                 35.2  ml       ----------  LA volume/bsa, ES, 1-p A4C             13.4   ml/m^2   ----------  LA volume, ES, 1-p A2C                 58.7  ml       ----------  LA volume/bsa, ES, 1-p A2C             22.4  ml/m^2   ----------    Mitral valve                           Value          Reference  Mitral E-wave peak velocity            87.3  cm/s     ----------  Mitral deceleration time       (H)     289   ms       150 - 230  Mitral pressure half-time              85    ms       ----------  Mitral peak gradient, D                3  mm Hg    ----------  Mitral valve area, PHT, DP             2.59  cm^2     ----------  Mitral valve area/bsa, PHT, DP         0.99  cm^2/m^2 ----------    Right atrium                           Value          Reference  RA ID, S-I, ES, A4C                    43.1  mm       34 - 49  RA area, ES, A4C                       11.2  cm^2     8.3 - 19.5  RA volume, ES, A/L                     22.7  ml       ----------  RA volume/bsa, ES, A/L                 8.7   ml/m^2   ----------    Systemic veins                         Value          Reference  Estimated CVP                          3     mm Hg    ----------    Right ventricle                        Value          Reference  TAPSE                                  17.5  mm       ----------  RV s&', lateral, S                      8.92  cm/s     ----------  Legend: (L)  and  (H)  mark values outside specified reference range.  ------------------------------------------------------------------- Prepared and Electronically Authenticated by  Candee Furbish, M.D. 2019-11-30T12:50:41 [employer] Images   Show images for ECHOCARDIOGRAM COMPLETE MERGE Images   Show images for ECHOCARDIOGRAM COMPLETE Performing Technologist/Nurse   Performing Technologist/Nurse: Delfin Edis Reason for Exam  Priority: Anticipated Discharge  Comments:  Patient Data   Height  78 in  BP  103/65 mmHg               Surgical History   Surgical History     Procedure Laterality Date Comment Source CARDIAC CATHETERIZATION  09/2012 "Nonobstructive CAD with 30% proximal, 40% mid LAD disease; 30% proximal CFX; EF 55-65%" Provider  Other Surgical History    Procedure Laterality Date Comment Source APPENDECTOMY    Provider CHOLECYSTECTOMY N/A 01/27/2013 Procedure: LAPAROSCOPIC CHOLECYSTECTOMY; Surgeon: Jamesetta So, MD; Location: [facility]; Service: General; Laterality: N/A; Provider CORONARY STENT INTERVENTION N/A 11/15/2017 Procedure: CORONARY STENT INTERVENTION; Surgeon: Leonie Man, MD;  Location: [place] [facility]; Service: Cardiovascular; Laterality: N/A; Provider HERNIA REPAIR   As a child Provider INSERTION OF MESH N/A 11/13/2012 Procedure: INSERTION OF MESH; Surgeon: Jamesetta So, MD; Location: [facility]; Service: General; Laterality: N/A; Provider IR ANGIO INTRA EXTRACRAN SEL COM CAROTID INNOMINATE BILAT MOD SED  12/01/2017  Provider IR ANGIO INTRA EXTRACRAN SEL COM CAROTID INNOMINATE BILAT MOD SED  02/13/2018  Provider IR ANGIO VERTEBRAL SEL SUBCLAVIAN INNOMINATE UNI L MOD SED  02/13/2018  Provider IR ANGIO VERTEBRAL SEL VERTEBRAL BILAT MOD SED  12/01/2017  Provider IR ANGIO VERTEBRAL SEL VERTEBRAL UNI R MOD SED  02/13/2018  Provider IR INTRA CRAN STENT  12/03/2017  Provider IR US GUIDE VASC ACCESS RIGHT  02/13/2018  Provider KIDNEY STONE SURGERY    Provider LEFT HEART CATH AND CORONARY ANGIOGRAPHY N/A 11/15/2017 Procedure: LEFT HEART CATH AND CORONARY ANGIOGRAPHY; Surgeon: Leonie Man, MD; Location: [place] [facility]; Service: Cardiovascular; Laterality: N/A; Provider PERCUTANEOUS NEPHROLITHOTRIPSY    Provider RADIOLOGY WITH ANESTHESIA Left 12/03/2017 Procedure: Angioplasty with possible stenting of left VBJ; Surgeon: Luanne Bras, MD; Location: [place]; Service: Radiology; Laterality: Left; Provider UMBILICAL HERNIA REPAIR N/A 11/19/2424 Procedure: UMBILICAL HERNIORRHAPHY; Surgeon: Jamesetta So, MD; Location: [facility]; Service: General; Laterality: N/A; Provider VARICOCELECTOMY    Provider  Implants    Permanent Stent Stent Synergy Des 2.25x12 - [id number] - Implanted  Dist LAD    Inventory item: STENT SYNERGY DES 2.25X12 Model/Cat number: [id number] Manufacturer: Wilkie Aye Lot number: [id number] Device identifier: [id number] Device identifier type: GS1 Area Of Implantation: Dist LAD   As of 11/15/2017     Status: Implanted    Stent Synergy Des 2.25x16 - [id number] - Implanted  Mid LAD    Inventory item: Melissa Noon DES 8.78M76 Model/Cat number: [id number] Manufacturer: Wilkie Aye Lot number: [id number] Device identifier: [id number] Device identifier type: GS1 Area Of Implantation: Mid LAD   As of 11/15/2017     Status: Implanted    Stent Synergy Des 2.25x16 - [id number] - Implanted  2nd OM    Inventory item: Melissa Noon DES 9.67R91 Model/Cat number: [id number] Manufacturer: Wilkie Aye Lot number: [id number] Device identifier: [id number] Device identifier type: GS1 Area Of Implantation: 2nd OM   As of 11/15/2017     Status: Implanted    Stent Enterprise 2 4x39 - [id number] - Implanted  Brain    Inventory item: STENT ENTERPRISE 2 4X39 Model/Cat number: [id number] Manufacturer: George Lot number: [id number] Device identifier: [id number] Device identifier type: GS1 Area Of Implantation: Brain   As of 12/03/2017     Status: Implanted    Order-Level Documents - 02/08/2018:   Scan on 02/13/2018 2:07 PM by Kathrin Penner    Encounter-Level Documents - 02/08/2018:   Scan on 02/14/2018 9:43 AM by Default, Provider, MD  Document on 02/13/2018 4:49 PM by Wendee Copp, RN: IP After Visit Summary  Document on 02/13/2018 1:01 AM by Arlis Porta, RN: ED PB Billing Extract  Scan on 02/09/2018 9:18 AM by Default, Provider, MD  Scan on 02/11/2018 9:05 AM by Default, Provider, MD  Scan on 02/10/2018 7:25 PM by Default, Provider,  MD  Electronic signature on 02/08/2018 9:46 PM - Signed  Electronic signature on 02/08/2018 9:17 PM - Signed    Resulted by:   Signed Date/Time  Phone Pager Candee Furbish C 02/09/2018 12:50 PM [phone]  External Result Report    External Result Report      Reproductive/Obstetrics  Anesthesia Evaluation  Patient identified by MRN, date of birth, ID band Patient awake    Reviewed: Allergy & Precautions, NPO status , Patient's Chart, lab work & pertinent test results  Airway Mallampati: II  TM Distance: >3 FB Neck ROM: Full    Dental   Pulmonary shortness of breath, Current Smoker,    Pulmonary exam normal        Cardiovascular hypertension, Pt. on medications + CAD and + Cardiac Stents  Normal cardiovascular exam     Neuro/Psych CVA    GI/Hepatic GERD  Medicated and Controlled,  Endo/Other  diabetes, Type 2, Insulin Dependent  Renal/GU Renal InsufficiencyRenal disease     Musculoskeletal   Abdominal   Peds  Hematology   Anesthesia Other Findings   Reproductive/Obstetrics                             Anesthesia Physical Anesthesia Plan  ASA: III  Anesthesia Plan: General   Post-op Pain Management:    Induction: Intravenous  PONV Risk Score and Plan: 1 and Ondansetron and Treatment may vary due to age or medical condition  Airway Management Planned: Oral ETT  Additional Equipment:   Intra-op Plan:   Post-operative Plan: Extubation in OR  Informed Consent: I have reviewed the patients History and Physical, chart, labs and discussed the procedure including the risks, benefits and alternatives for the proposed anesthesia with the patient or authorized representative who has indicated his/her understanding and acceptance.     Plan Discussed with: CRNA and Surgeon  Anesthesia Plan Comments:         Anesthesia Quick  Evaluation                                   Anesthesia Evaluation  Patient identified by MRN, date of birth, ID band Patient awake    Reviewed: Allergy & Precautions, NPO status , Patient's Chart, lab work & pertinent test results  Airway Mallampati: II  TM Distance: >3 FB Neck ROM: Full    Dental   Pulmonary shortness of breath, Current Smoker,    Pulmonary exam normal        Cardiovascular hypertension, Pt. on medications + CAD and + Cardiac Stents  Normal cardiovascular exam     Neuro/Psych CVA    GI/Hepatic GERD  Medicated and Controlled,  Endo/Other  diabetes, Type 2, Insulin Dependent  Renal/GU Renal InsufficiencyRenal disease     Musculoskeletal   Abdominal   Peds  Hematology   Anesthesia Other Findings   Reproductive/Obstetrics                             Anesthesia Physical Anesthesia Plan  ASA: III  Anesthesia Plan: General   Post-op Pain Management:    Induction: Intravenous  PONV Risk Score and Plan: 1 and Ondansetron and Treatment may vary due to age or medical condition  Airway Management Planned: Oral ETT  Additional Equipment:   Intra-op Plan:   Post-operative Plan: Extubation in OR  Informed Consent: I have reviewed the patients History and Physical, chart, labs and discussed the procedure including the risks, benefits and alternatives for the proposed anesthesia with the patient or authorized representative who has indicated his/her understanding and acceptance.     Plan Discussed with: CRNA and Surgeon  Anesthesia Plan Comments:  Anesthesia Quick Evaluation  Anesthesia Physical Anesthesia Plan  ASA: III  Anesthesia Plan: General   Post-op Pain Management:    Induction: Intravenous  PONV Risk Score and Plan:   Airway Management Planned: Oral ETT  Additional Equipment: Arterial line  Intra-op Plan:   Post-operative Plan: Extubation in OR  Informed Consent:  I have reviewed the patients History and Physical, chart, labs and discussed the procedure including the risks, benefits and alternatives for the proposed anesthesia with the patient or authorized representative who has indicated his/her understanding and acceptance.   Dental advisory given  Plan Discussed with: CRNA  Anesthesia Plan Comments: (See PAT note 02/22/2018 by Karoline Caldwell, PA-C )      Anesthesia Quick Evaluation                                  Anesthesia Evaluation  Patient identified by MRN, date of birth, ID band Patient awake    Reviewed: Allergy & Precautions, NPO status , Patient's Chart, lab work & pertinent test results  Airway Mallampati: II  TM Distance: >3 FB Neck ROM: Full    Dental   Pulmonary shortness of breath, Current Smoker,    Pulmonary exam normal        Cardiovascular hypertension, Pt. on medications + CAD and + Cardiac Stents  Normal cardiovascular exam     Neuro/Psych CVA    GI/Hepatic GERD  Medicated and Controlled,  Endo/Other  diabetes, Type 2, Insulin Dependent  Renal/GU Renal InsufficiencyRenal disease     Musculoskeletal   Abdominal   Peds  Hematology   Anesthesia Other Findings   Reproductive/Obstetrics                             Anesthesia Physical Anesthesia Plan  ASA: III  Anesthesia Plan: General   Post-op Pain Management:    Induction: Intravenous  PONV Risk Score and Plan: 1 and Ondansetron and Treatment may vary due to age or medical condition  Airway Management Planned: Oral ETT  Additional Equipment:   Intra-op Plan:   Post-operative Plan: Extubation in OR  Informed Consent: I have reviewed the patients History and Physical, chart, labs and discussed the procedure including the risks, benefits and alternatives for the proposed anesthesia with the patient or authorized representative who has indicated his/her understanding and acceptance.     Plan  Discussed with: CRNA and Surgeon  Anesthesia Plan Comments:         Anesthesia Quick Evaluation

## 2018-02-22 NOTE — Progress Notes (Signed)
Anesthesia Chart Review: SAME DAY WORKUP   Case:  [id number] Date/Time:  02/25/18 0715   Procedure:  STENT PLACEMENT (N/A )   Anesthesia type:  General   Pre-op diagnosis:  STENOSIS   Location:  [facility] RADIOLOGY ROOM / [place] [facility]   Surgeon:  Luanne Bras, MD      DISCUSSION: 47 yo male current smoker. Pertinent hx includes CAD (s/p DES x2 to LAD and DES to OM in 11/2017), ischemic cardiomyopathy (EF 35-40%), Type 2 DM, HTN, HLD, Hypothyroidism, Multiple CVAs. S/p basilar artery revascularization with stent angioplasty on 12/03/2017 by Dr. [name] 12/03/2017.   He was admitted to [facility] from 11/30/2017 and 12/05/2017 for evaluation of chest pain and dizziness. He was also found to have mild weakness of his left side upon admission and Code Stroke was activated. 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 [name].   He was again admitted from 01/05/2018 to 01/08/2018 for an acute CVA with MRI on admission showed multiple small foci of ischemia within the posterior circulation.  He was followed closely by the stroke team and it was recommended he continue on dual antiplatelet therapy as there was thought to be no role for diagnostic cerebral catheter angiogram during admission. Delene Loll was discontinued at the time of hospital discharge due to hypotension and he was not started on beta-blocker therapy given baseline bradycardia.  He was again admitted 11/29-12/4/19 for Acute CVA on MRI. CTA head and neck showed vertebrobasilar stent in place, 60% left ICA stenosis. Echo showed EF of 35 to 40%, no cardiac source of emboli. Patient underwent cerebral angiogram which showed severe distal IntraStent stenosis in the proximal basilar artery, occluded right VBJ, plan for elective angioplasty in 1 week by Dr [name].  Will need DOS labs per IR. Anticipate can proceed as planned if DOS labs acceptable.  VS: There were no  vitals taken for this visit.  PROVIDERS: Health, [facility] for primary care   LABS: Will need DOS labs per IR. CBC and BMP from 02/12/2018 are reviewed and WNL.  IMAGES: IR Angiogram 02/13/2018: IMPRESSION: Severe stenosis within the previously positioned stent in the vertebrobasilar junction extending into the basilar artery.  Interval significant improvement of the 2 tandem lesions in the more proximal basilar artery and the left vertebrobasilar junction.  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  Occluded right vertebrobasilar junction just distal to the right posterior-inferior cerebellar artery.   EKG: 02/08/18: Sinus rhythm. Rate 79. Borderline right axis deviation  CV: TTE 02/09/2018:  Study Conclusions  - Left ventricle: The cavity size was normal. There was mild   concentric hypertrophy. Systolic function was moderately reduced.   The estimated ejection fraction was in the range of 35% to 40%.   Diffuse hypokinesis. Doppler parameters are consistent with   abnormal left ventricular relaxation (grade 1 diastolic   dysfunction). - Mitral valve: There was mild regurgitation. - Pericardium, extracardiac: A trivial pericardial effusion was   identified along the right ventricular free wall.  Impressions:  - Compared to the prior study, there has been no significant   interval change.  Past Medical History:  Diagnosis Date  . Asthma   . Chest pain   . Coronary artery disease    a. s/p DES x2 to LAD and DES to OM in 11/2017  . Dyspnea   . GERD (gastroesophageal reflux disease)   .  History of kidney stones   . History of rhabdomyolysis   . Hyperlipidemia   . Hypertension   . Hypothyroidism   . Ischemic cardiomyopathy    a. 11/2017: echo showing EF of 35-40%, diffuse HK, and Grade 2 DD  . Kidney calculus 2014  . Pericardial effusion    Small, by dobutamine echocardiogram, 04/2006  . Stroke  ([place])   . Tobacco abuse   . Type 2 diabetes mellitus without complications ([place]) 11/27/6058  . Vitamin D deficiency     Past Surgical History:  Procedure Laterality Date  . 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 CHOLECYSTECTOMY N/A 01/27/2013   Procedure: LAPAROSCOPIC CHOLECYSTECTOMY;  Surgeon: Jamesetta So, MD;  Location: [facility];  Service: General;  Laterality: N/A;  . CORONARY STENT INTERVENTION N/A 11/15/2017   Procedure: CORONARY STENT INTERVENTION;  Surgeon: Leonie Man, MD;  Location: [place] [facility];  Service: Cardiovascular;  Laterality: N/A;  . HERNIA REPAIR     As a child  . INSERTION OF MESH N/A 11/13/2012   Procedure: INSERTION OF MESH;  Surgeon: Jamesetta So, MD;  Location: [facility];  Service: General;  Laterality: N/A;  . IR ANGIO INTRA EXTRACRAN SEL COM CAROTID INNOMINATE BILAT MOD SED  12/01/2017  . IR ANGIO INTRA EXTRACRAN SEL COM CAROTID INNOMINATE BILAT MOD SED  02/13/2018  . IR ANGIO VERTEBRAL SEL SUBCLAVIAN INNOMINATE UNI L MOD SED  02/13/2018  . IR ANGIO VERTEBRAL SEL VERTEBRAL BILAT MOD SED  12/01/2017  . IR ANGIO VERTEBRAL SEL VERTEBRAL UNI R MOD SED  02/13/2018  . IR INTRA CRAN STENT  12/03/2017  . IR US GUIDE VASC ACCESS RIGHT  02/13/2018  . KIDNEY STONE SURGERY    . LEFT HEART CATH AND CORONARY ANGIOGRAPHY N/A 11/15/2017   Procedure: LEFT HEART CATH AND CORONARY ANGIOGRAPHY;  Surgeon: Leonie Man, MD;  Location: [place] [facility];  Service: Cardiovascular;  Laterality: N/A;  . PERCUTANEOUS NEPHROLITHOTRIPSY    . RADIOLOGY WITH ANESTHESIA Left 12/03/2017   Procedure: Angioplasty with possible stenting of left VBJ;  Surgeon: Luanne Bras, MD;  Location: [place];  Service: Radiology;  Laterality: Left;  . UMBILICAL HERNIA REPAIR N/A 11/13/2012   Procedure: UMBILICAL HERNIORRHAPHY;  Surgeon: Jamesetta So, MD;  Location: [facility];  Service: General;  Laterality: N/A;  .  VARICOCELECTOMY      MEDICATIONS: No current facility-administered medications for this encounter.    Marland Kitchen aspirin 81 MG EC tablet  . Blood Glucose Monitoring Suppl w/Device KIT  . citalopram (CELEXA) 10 MG tablet  . ezetimibe (ZETIA) 10 MG tablet  . fenofibrate 160 MG tablet  . gabapentin (NEURONTIN) 300 MG capsule  . glimepiride (AMARYL) 2 MG tablet  . levothyroxine (SYNTHROID, LEVOTHROID) 175 MCG tablet  . losartan (COZAAR) 25 MG tablet  . nitroGLYCERIN (NITROSTAT) 0.4 MG SL tablet  . pantoprazole (PROTONIX) 40 MG tablet  . sitaGLIPtin-metformin (JANUMET) 50-1000 MG tablet  . ticagrelor (BRILINTA) 90 MG TABS tablet    Wynonia Musty [facility] Short Stay Center/Anesthesiology Phone [phone] 02/22/2018 11:53 AM

## 2018-02-22 NOTE — Progress Notes (Addendum)
Pt denies SOB and chest pain. Pt under the care of Dr. Wyline MoodBranch, Cardiology. Pt made aware to stop taking vitamins, fish oil and herbal medications. Do not take any NSAIDs ie: Ibuprofen, Advil, Naproxen (ALeve), Motrin, BC and Goody Powder. Pt made aware to not take Janumet and Glimepiride on DOS. Pt made aware to check BG every 2 hours prior to arrival to hospital on DOS. Pt made aware to treat a BG < 70 with 4 ounces of apple  juice, wait 15 minutes after intervention to recheck BG, if BG remains < 70, call Short Stay unit to speak with a nurse. Pt verbalized understanding of all pre-op instructions. PA, Anesthesiology, asked to review pt history; see note.

## 2018-02-22 NOTE — Patient Outreach (Signed)
[facility] Care Management  02/22/2018  Varney DailyCarl L Lutes 1970/10/21 [id number]   EMMI- stroke  RED ON EMMI ALERT Day # 6  Date: Wednesday 02/20/18 1610  Red Alert Reason: Smoked or been around smoke? yes   Insurance: medicaid potential  [facility] admissions x 4 last one on 02/08/18  ED visits in the last 6 months    Outreach attempt # 2  Mr Childers's phone's mail box is full and continues to not be able to allow voice messages as on 02/21/18  [facility] RN CM is unable to leave a voice message for him   Plan: [facility] scheduled this patient for another call attempt within 4 business days   Cain Fitzhenry L. Noelle PennerGibbs, RN, BSN, CCM [facility] Telephonic Care Management Care Coordinator Office number [phone] Mobile number [phone]  Main [facility] number [phone] Fax number [phone]

## 2018-02-24 ENCOUNTER — Encounter (HOSPITAL_COMMUNITY): Payer: Self-pay | Admitting: Anesthesiology

## 2018-02-25 ENCOUNTER — Ambulatory Visit (HOSPITAL_COMMUNITY): Payer: Self-pay | Admitting: Physician Assistant

## 2018-02-25 ENCOUNTER — Inpatient Hospital Stay (HOSPITAL_COMMUNITY)
Admission: RE | Admit: 2018-02-25 | Discharge: 2018-02-26 | DRG: 027 | Disposition: A | Discharge status: Home / Self Care | Payer: Self-pay | Attending: Interventional Radiology | Admitting: Interventional Radiology

## 2018-02-25 ENCOUNTER — Other Ambulatory Visit: Payer: Self-pay

## 2018-02-25 ENCOUNTER — Institutional Professional Consult (permissible substitution): Payer: Self-pay | Admitting: Neurology

## 2018-02-25 ENCOUNTER — Encounter (HOSPITAL_COMMUNITY)
Admission: RE | Disposition: A | Discharge status: Home / Self Care | Payer: Self-pay | Source: Home / Self Care | Attending: Interventional Radiology

## 2018-02-25 ENCOUNTER — Other Ambulatory Visit: Payer: Self-pay | Admitting: *Deleted

## 2018-02-25 ENCOUNTER — Encounter (HOSPITAL_COMMUNITY): Payer: Self-pay

## 2018-02-25 ENCOUNTER — Ambulatory Visit (HOSPITAL_COMMUNITY)
Admission: RE | Admit: 2018-02-25 | Discharge: 2018-02-25 | Disposition: A | Discharge status: Home / Self Care | Payer: Self-pay | Source: Ambulatory Visit | Attending: Interventional Radiology | Admitting: Interventional Radiology

## 2018-02-25 DIAGNOSIS — Z7902 Long term (current) use of antithrombotics/antiplatelets: Secondary | ICD-10-CM | POA: Insufficient documentation

## 2018-02-25 DIAGNOSIS — I651 Occlusion and stenosis of basilar artery: Principal | ICD-10-CM | POA: Diagnosis present

## 2018-02-25 DIAGNOSIS — Z8673 Personal history of transient ischemic attack (TIA), and cerebral infarction without residual deficits: Secondary | ICD-10-CM

## 2018-02-25 DIAGNOSIS — F17210 Nicotine dependence, cigarettes, uncomplicated: Secondary | ICD-10-CM

## 2018-02-25 DIAGNOSIS — I6502 Occlusion and stenosis of left vertebral artery: Secondary | ICD-10-CM

## 2018-02-25 DIAGNOSIS — Z888 Allergy status to other drugs, medicaments and biological substances status: Secondary | ICD-10-CM

## 2018-02-25 DIAGNOSIS — Z79899 Other long term (current) drug therapy: Secondary | ICD-10-CM

## 2018-02-25 DIAGNOSIS — Z7984 Long term (current) use of oral hypoglycemic drugs: Secondary | ICD-10-CM

## 2018-02-25 DIAGNOSIS — Z7982 Long term (current) use of aspirin: Secondary | ICD-10-CM | POA: Insufficient documentation

## 2018-02-25 DIAGNOSIS — Z955 Presence of coronary angioplasty implant and graft: Secondary | ICD-10-CM

## 2018-02-25 DIAGNOSIS — E785 Hyperlipidemia, unspecified: Secondary | ICD-10-CM | POA: Diagnosis present

## 2018-02-25 DIAGNOSIS — I2510 Atherosclerotic heart disease of native coronary artery without angina pectoris: Secondary | ICD-10-CM | POA: Diagnosis present

## 2018-02-25 DIAGNOSIS — D571 Sickle-cell disease without crisis: Secondary | ICD-10-CM | POA: Diagnosis present

## 2018-02-25 DIAGNOSIS — E039 Hypothyroidism, unspecified: Secondary | ICD-10-CM | POA: Diagnosis present

## 2018-02-25 DIAGNOSIS — E119 Type 2 diabetes mellitus without complications: Secondary | ICD-10-CM | POA: Diagnosis present

## 2018-02-25 DIAGNOSIS — K219 Gastro-esophageal reflux disease without esophagitis: Secondary | ICD-10-CM | POA: Diagnosis present

## 2018-02-25 DIAGNOSIS — G4730 Sleep apnea, unspecified: Secondary | ICD-10-CM | POA: Diagnosis present

## 2018-02-25 DIAGNOSIS — Z0289 Encounter for other administrative examinations: Secondary | ICD-10-CM

## 2018-02-25 DIAGNOSIS — I255 Ischemic cardiomyopathy: Secondary | ICD-10-CM | POA: Diagnosis present

## 2018-02-25 DIAGNOSIS — I771 Stricture of artery: Secondary | ICD-10-CM

## 2018-02-25 DIAGNOSIS — J45909 Unspecified asthma, uncomplicated: Secondary | ICD-10-CM | POA: Diagnosis present

## 2018-02-25 DIAGNOSIS — I10 Essential (primary) hypertension: Secondary | ICD-10-CM | POA: Diagnosis present

## 2018-02-25 HISTORY — DX: Nausea with vomiting, unspecified: R11.2

## 2018-02-25 HISTORY — DX: Sleep apnea, unspecified: G47.30

## 2018-02-25 HISTORY — DX: Depression, unspecified: F32.A

## 2018-02-25 HISTORY — DX: Headache, unspecified: R51.9

## 2018-02-25 HISTORY — DX: Major depressive disorder, single episode, unspecified: F32.9

## 2018-02-25 HISTORY — DX: Other specified postprocedural states: Z98.890

## 2018-02-25 HISTORY — PX: RADIOLOGY WITH ANESTHESIA: SHX6223

## 2018-02-25 HISTORY — DX: Headache: R51

## 2018-02-25 HISTORY — PX: IR PTA INTRACRANIAL: IMG2344

## 2018-02-25 LAB — PROTIME-INR
INR: 0.91
Prothrombin Time: 12.2 seconds (ref 11.4–15.2)

## 2018-02-25 LAB — GLUCOSE, CAPILLARY
GLUCOSE-CAPILLARY: 156 mg/dL — AB (ref 70–99)
GLUCOSE-CAPILLARY: 237 mg/dL — AB (ref 70–99)
GLUCOSE-CAPILLARY: 194 mg/dL — AB (ref 70–99)
Glucose-Capillary: 167 mg/dL — ABNORMAL HIGH (ref 70–99)

## 2018-02-25 LAB — PLATELET INHIBITION P2Y12: Platelet Function  P2Y12: 150 [PRU] — ABNORMAL LOW (ref 194–418)

## 2018-02-25 LAB — POCT ACTIVATED CLOTTING TIME
Activated Clotting Time: 169 seconds
Activated Clotting Time: 197 seconds

## 2018-02-25 LAB — MRSA PCR SCREENING: MRSA by PCR: NEGATIVE

## 2018-02-25 LAB — HEPARIN LEVEL (UNFRACTIONATED): Heparin Unfractionated: <0.1 IU/mL — ABNORMAL LOW (ref 0.30–0.70)

## 2018-02-25 SURGERY — IR WITH ANESTHESIA
Anesthesia: General

## 2018-02-25 MED ORDER — ONDANSETRON HCL 4 MG/2ML IJ SOLN
INTRAMUSCULAR | Status: DC | PRN
  Administered 2018-02-25: 4 mg via INTRAVENOUS

## 2018-02-25 MED ORDER — LABETALOL HCL 5 MG/ML IV SOLN
INTRAVENOUS | Status: DC | PRN
  Administered 2018-02-25: 10 mg via INTRAVENOUS
  Administered 2018-02-25: 5 mg via INTRAVENOUS

## 2018-02-25 MED ORDER — ONDANSETRON HCL 4 MG/2ML IJ SOLN: 4.0000 mg | Freq: Once | INTRAMUSCULAR | Status: DC | PRN

## 2018-02-25 MED ORDER — IOHEXOL 300 MG/ML  SOLN: 98.0000 mL | Freq: Once | INTRAMUSCULAR | Status: DC | PRN

## 2018-02-25 MED ORDER — NITROGLYCERIN 1 MG/10 ML FOR IR/CATH LAB
INTRA_ARTERIAL | Status: AC
  Filled 2018-02-25: qty 10

## 2018-02-25 MED ORDER — METFORMIN HCL 500 MG PO TABS: 1000.0000 mg | ORAL_TABLET | Freq: Two times a day (BID) | ORAL | Status: DC

## 2018-02-25 MED ORDER — TICAGRELOR 90 MG PO TABS
90.0000 mg | ORAL_TABLET | Freq: Two times a day (BID) | ORAL | Status: DC
  Administered 2018-02-26: 90 mg via ORAL
  Filled 2018-02-25: qty 1

## 2018-02-25 MED ORDER — LINAGLIPTIN 5 MG PO TABS: 5.0000 mg | ORAL_TABLET | Freq: Every day | ORAL | Status: DC

## 2018-02-25 MED ORDER — ASPIRIN 81 MG PO CHEW: 81.0000 mg | CHEWABLE_TABLET | Freq: Every day | ORAL | Status: DC

## 2018-02-25 MED ORDER — ACETAMINOPHEN 10 MG/ML IV SOLN: 1000.0000 mg | Freq: Once | INTRAVENOUS | Status: DC | PRN

## 2018-02-25 MED ORDER — TICAGRELOR 60 MG PO TABS
ORAL_TABLET | ORAL | Status: AC | PRN
  Administered 2018-02-25: 90 mg via NASOGASTRIC

## 2018-02-25 MED ORDER — SITAGLIPTIN PHOS-METFORMIN HCL 50-1000 MG PO TABS: 1.0000 | ORAL_TABLET | Freq: Two times a day (BID) | ORAL | Status: DC

## 2018-02-25 MED ORDER — ASPIRIN EC 325 MG PO TBEC
DELAYED_RELEASE_TABLET | ORAL | Status: AC
  Administered 2018-02-25: 325 mg via ORAL
  Filled 2018-02-25: qty 1

## 2018-02-25 MED ORDER — CLEVIDIPINE BUTYRATE 0.5 MG/ML IV EMUL
0.0000 mg/h | INTRAVENOUS | Status: AC
  Administered 2018-02-25: 2 mg/h via INTRAVENOUS
  Filled 2018-02-25: qty 50

## 2018-02-25 MED ORDER — ONDANSETRON HCL 4 MG/2ML IJ SOLN: 4.0000 mg | Freq: Four times a day (QID) | INTRAMUSCULAR | Status: DC | PRN

## 2018-02-25 MED ORDER — HEPARIN (PORCINE) 25000 UT/250ML-% IV SOLN
700.0000 [IU]/h | INTRAVENOUS | Status: DC
  Administered 2018-02-25: 700 [IU]/h via INTRAVENOUS
  Filled 2018-02-25: qty 250

## 2018-02-25 MED ORDER — GLIMEPIRIDE 2 MG PO TABS
2.0000 mg | ORAL_TABLET | Freq: Every day | ORAL | Status: DC
  Administered 2018-02-26: 2 mg via ORAL
  Filled 2018-02-25: qty 1

## 2018-02-25 MED ORDER — SUGAMMADEX SODIUM 200 MG/2ML IV SOLN
INTRAVENOUS | Status: DC | PRN
  Administered 2018-02-25: 300 mg via INTRAVENOUS

## 2018-02-25 MED ORDER — INSULIN ASPART 100 UNIT/ML ~~LOC~~ SOLN
0.0000 [IU] | Freq: Three times a day (TID) | SUBCUTANEOUS | Status: DC
  Administered 2018-02-25: 5 [IU] via SUBCUTANEOUS
  Administered 2018-02-26: 2 [IU] via SUBCUTANEOUS

## 2018-02-25 MED ORDER — TICAGRELOR 90 MG PO TABS: 90.0000 mg | ORAL_TABLET | Freq: Two times a day (BID) | ORAL | Status: DC

## 2018-02-25 MED ORDER — ACETAMINOPHEN 650 MG RE SUPP: 650.0000 mg | RECTAL | Status: DC | PRN

## 2018-02-25 MED ORDER — FENTANYL CITRATE (PF) 100 MCG/2ML IJ SOLN
INTRAMUSCULAR | Status: DC | PRN
  Administered 2018-02-25 (×2): 50 ug via INTRAVENOUS

## 2018-02-25 MED ORDER — EPTIFIBATIDE 20 MG/10ML IV SOLN
INTRAVENOUS | Status: AC
  Filled 2018-02-25: qty 10

## 2018-02-25 MED ORDER — HEPARIN SODIUM (PORCINE) 1000 UNIT/ML IJ SOLN
INTRAMUSCULAR | Status: DC | PRN
  Administered 2018-02-25: 3000 [IU] via INTRAVENOUS
  Administered 2018-02-25: 1000 [IU] via INTRAVENOUS

## 2018-02-25 MED ORDER — FENOFIBRATE 160 MG PO TABS
160.0000 mg | ORAL_TABLET | Freq: Every day | ORAL | Status: DC
  Administered 2018-02-26: 160 mg via ORAL
  Filled 2018-02-25: qty 1

## 2018-02-25 MED ORDER — NITROGLYCERIN 0.4 MG SL SUBL: 0.4000 mg | SUBLINGUAL_TABLET | SUBLINGUAL | Status: DC | PRN

## 2018-02-25 MED ORDER — LEVOTHYROXINE SODIUM 75 MCG PO TABS
175.0000 ug | ORAL_TABLET | Freq: Every day | ORAL | Status: DC
  Administered 2018-02-26: 175 ug via ORAL
  Filled 2018-02-25: qty 1

## 2018-02-25 MED ORDER — SODIUM CHLORIDE 0.9 % IV SOLN
INTRAVENOUS | Status: DC
  Administered 2018-02-25 (×2): via INTRAVENOUS

## 2018-02-25 MED ORDER — PANTOPRAZOLE SODIUM 40 MG PO TBEC
40.0000 mg | DELAYED_RELEASE_TABLET | Freq: Every morning | ORAL | Status: DC
  Administered 2018-02-26: 40 mg via ORAL
  Filled 2018-02-25: qty 1

## 2018-02-25 MED ORDER — PHENYLEPHRINE HCL 10 MG/ML IJ SOLN
INTRAMUSCULAR | Status: DC | PRN
  Administered 2018-02-25 (×2): 40 ug via INTRAVENOUS
  Administered 2018-02-25: 80 ug via INTRAVENOUS
  Administered 2018-02-25: 40 ug via INTRAVENOUS

## 2018-02-25 MED ORDER — MEPERIDINE HCL 50 MG/ML IJ SOLN: 6.2500 mg | INTRAMUSCULAR | Status: DC | PRN

## 2018-02-25 MED ORDER — FENTANYL CITRATE (PF) 100 MCG/2ML IJ SOLN: 25.0000 ug | INTRAMUSCULAR | Status: DC | PRN

## 2018-02-25 MED ORDER — ACETAMINOPHEN 325 MG PO TABS: 650.0000 mg | ORAL_TABLET | ORAL | Status: DC | PRN

## 2018-02-25 MED ORDER — HEPARIN (PORCINE) 25000 UT/250ML-% IV SOLN
1000.0000 [IU]/h | INTRAVENOUS | Status: AC
  Administered 2018-02-25: 1000 [IU]/h via INTRAVENOUS

## 2018-02-25 MED ORDER — PROPOFOL 10 MG/ML IV BOLUS
INTRAVENOUS | Status: DC | PRN
  Administered 2018-02-25: 200 mg via INTRAVENOUS

## 2018-02-25 MED ORDER — ACETAMINOPHEN 160 MG/5ML PO SOLN: 650.0000 mg | ORAL | Status: DC | PRN

## 2018-02-25 MED ORDER — TICAGRELOR 90 MG PO TABS
ORAL_TABLET | ORAL | Status: AC
  Filled 2018-02-25: qty 1

## 2018-02-25 MED ORDER — LOSARTAN POTASSIUM 25 MG PO TABS
12.5000 mg | ORAL_TABLET | Freq: Every day | ORAL | Status: DC
  Administered 2018-02-25 – 2018-02-26 (×2): 12.5 mg via ORAL
  Filled 2018-02-25 (×2): qty 0.5

## 2018-02-25 MED ORDER — EZETIMIBE 10 MG PO TABS
10.0000 mg | ORAL_TABLET | Freq: Every day | ORAL | Status: DC
  Administered 2018-02-26: 10 mg via ORAL
  Filled 2018-02-25: qty 1

## 2018-02-25 MED ORDER — SODIUM CHLORIDE 0.9 % IV SOLN
INTRAVENOUS | Status: DC
  Administered 2018-02-25: 15:00:00 via INTRAVENOUS

## 2018-02-25 MED ORDER — CITALOPRAM HYDROBROMIDE 10 MG PO TABS
10.0000 mg | ORAL_TABLET | Freq: Every day | ORAL | Status: DC
  Administered 2018-02-25 – 2018-02-26 (×2): 10 mg via ORAL
  Filled 2018-02-25 (×2): qty 1

## 2018-02-25 MED ORDER — ROCURONIUM BROMIDE 50 MG/5ML IV SOSY
PREFILLED_SYRINGE | INTRAVENOUS | Status: DC | PRN
  Administered 2018-02-25 (×3): 50 mg via INTRAVENOUS

## 2018-02-25 MED ORDER — GABAPENTIN 300 MG PO CAPS
300.0000 mg | ORAL_CAPSULE | Freq: Two times a day (BID) | ORAL | Status: DC
  Administered 2018-02-25 – 2018-02-26 (×2): 300 mg via ORAL
  Filled 2018-02-25 (×2): qty 1

## 2018-02-25 MED ORDER — ASPIRIN 81 MG PO CHEW
81.0000 mg | CHEWABLE_TABLET | Freq: Every day | ORAL | Status: DC
  Administered 2018-02-26: 81 mg via ORAL
  Filled 2018-02-25: qty 1

## 2018-02-25 MED ORDER — LIDOCAINE 2% (20 MG/ML) 5 ML SYRINGE
INTRAMUSCULAR | Status: DC | PRN
  Administered 2018-02-25: 100 mg via INTRAVENOUS

## 2018-02-25 MED ORDER — NIMODIPINE 30 MG PO CAPS
0.0000 mg | ORAL_CAPSULE | ORAL | Status: AC
  Administered 2018-02-25: 60 mg via ORAL

## 2018-02-25 MED ORDER — HEPARIN (PORCINE) 25000 UT/250ML-% IV SOLN
INTRAVENOUS | Status: AC
  Filled 2018-02-25: qty 250

## 2018-02-25 MED ORDER — ASPIRIN EC 325 MG PO TBEC
325.0000 mg | DELAYED_RELEASE_TABLET | ORAL | Status: AC
  Administered 2018-02-25: 325 mg via ORAL

## 2018-02-25 MED ORDER — DEXAMETHASONE SODIUM PHOSPHATE 10 MG/ML IJ SOLN
INTRAMUSCULAR | Status: DC | PRN
  Administered 2018-02-25: 4 mg via INTRAVENOUS

## 2018-02-25 MED ORDER — HEPARIN (PORCINE) 25000 UT/250ML-% IV SOLN
500.0000 [IU]/h | INTRAVENOUS | Status: DC
  Administered 2018-02-25: 500 [IU]/h via INTRAVENOUS
  Filled 2018-02-25: qty 250

## 2018-02-25 MED ORDER — NIMODIPINE 30 MG PO CAPS
ORAL_CAPSULE | ORAL | Status: AC
  Administered 2018-02-25: 60 mg via ORAL
  Filled 2018-02-25: qty 2

## 2018-02-25 MED ORDER — TICAGRELOR 90 MG PO TABS: 90.0000 mg | ORAL_TABLET | Freq: Once | ORAL | Status: DC

## 2018-02-25 MED ORDER — LINAGLIPTIN 5 MG PO TABS
5.0000 mg | ORAL_TABLET | Freq: Every day | ORAL | Status: DC
  Administered 2018-02-26: 5 mg via ORAL
  Filled 2018-02-25 (×2): qty 1

## 2018-02-25 MED ORDER — SODIUM CHLORIDE 0.9 % IV SOLN
INTRAVENOUS | Status: DC | PRN
  Administered 2018-02-25: 25 ug/min via INTRAVENOUS

## 2018-02-25 NOTE — Anesthesia Postprocedure Evaluation (Signed)
Anesthesia Post Note  Patient: Devin [name]  Procedure(s) Performed: STENT PLACEMENT (N/A )     Patient location during evaluation: PACU Anesthesia Type: General Level of consciousness: awake Pain management: pain level controlled Vital Signs Assessment: post-procedure vital signs reviewed and stable Respiratory status: spontaneous breathing Cardiovascular status: stable Postop Assessment: no apparent nausea or vomiting Anesthetic complications: no    Last Vitals:  Vitals:   02/25/18 1205 02/25/18 1335  BP:  126/88  Pulse:  60  Resp:  20  Temp: (!) 36.4 C   SpO2:  100%    Last Pain:  Vitals:   02/25/18 1305  TempSrc:   PainSc: 0-No pain   Pain Goal:                 Caren MacadamJohn F Jearline Hirschhorn Jr

## 2018-02-25 NOTE — Progress Notes (Signed)
Patient did not receive Brilinta prior to leaving the department for IR.   Lindley MagnusAli Louk, PA made aware.

## 2018-02-25 NOTE — Progress Notes (Signed)
Saw/evaluated patient in neuro ICU alongside Dr. Corliss Skainseveshwar this afternoon following procedure. Patient underwent an image-guided diagnostic cerebral angiogram with angioplasty of his basilar artery stenosis.  Patient awake and alert laying in bed with no complaints at this time.  Alert, awake, and oriented x3. Speech and comprehension intact. PERRL bilaterally. EOMs intact bilaterally without nystagmus or subjective diplopia. Visual fields not assessed. Demonstrates slight left facial droop. Tongue midline. Can spontaneously move all extremities. No pronator drift. Fine motor and coordination intact and symmetric. Gait not assessed. Romberg not assessed. Heel to toe not assessed. Distal pulses 2+ bilaterally. Right groin incision soft without active bleeding or hematoma.  Plan to stay in neuro ICU for overnight observation.  IR to follow.  Devin Bogalexandra M Louk, PA-C 02/25/2018, 4:24 PM

## 2018-02-25 NOTE — Progress Notes (Signed)
Patient's arterial line and cuff pressure are not correlating.  The wave form on the arterial line has a noticeable whip.  Discussed this with Dr Corliss Skainseveshwar when he rounded on the patient this afternoon.  Will continue to monitor  Devin ShaggySusie Riverlyn Kizziah RN

## 2018-02-25 NOTE — Progress Notes (Signed)
ANTICOAGULATION CONSULT NOTE - Follow Up Consult  Pharmacy Consult for Heparin Indication: s/p IR  Allergies  Allergen Reactions  . Crestor [Rosuvastatin] Other (See Comments)    rhabdomyolosis  . Bee Venom Swelling    SWELLING REACTION UNSPECIFIED     Patient Measurements: Height: 6\' 6"  (198.1 cm) Weight: 272 lb 0.8 oz (123.4 kg) IBW/kg (Calculated) : 91.4 Heparin Dosing Weight: 115  Vital Signs: Temp: 98.2 F (36.8 C) (12/16 2000) Temp Source: Oral (12/16 2000) BP: 125/70 (12/16 2200) Pulse Rate: 71 (12/16 2200)  Labs: Recent Labs    02/25/18 0629 02/25/18 2135  LABPROT 12.2  --   INR 0.91  --   HEPARINUNFRC  --  <0.10*    Estimated Creatinine Clearance: 109.7 mL/min (by C-G formula based on SCr of 1.24 mg/dL).  Assessment: Anticoag: s/p IR cerebral angiogram. HL <0.1   Goal of Therapy:  Heparin level 0.1-0.25 units/ml Monitor platelets by anticoagulation protocol: Yes   Plan:  -Increase IV heparin to 1000 units/hr. -IV heparin to stop at 0800 on 12/17 for sheath removal   Cato Liburd S. Merilynn Finlandobertson, PharmD, BCPS Clinical Staff Pharmacist Misty Stanleyobertson, Onyx Edgley Stillinger 02/25/2018,10:11 PM

## 2018-02-25 NOTE — H&P (Deleted)
  The note originally documented on this encounter has been moved the the encounter in which it belongs.  

## 2018-02-25 NOTE — Progress Notes (Signed)
Patient ID: Devin [name], male   DOB: 1971-02-09, 47 y.o.   MRN: [id number] INR. Post procedure  Extubated without difficulty. Maintaing PO2 sat. Awake . Responds appropriately to commands.Denies anu H/A,N/V Or visual difficulties. Pupils 3mm RT = Lt  No facial asymmetry. Tongue midline. Moves all 4 s proportional to effort.. Rt groin soft. Distal pulses DPs and PTs all palpable.  S.Eliakim Tendler MD

## 2018-02-25 NOTE — H&P (Signed)
Chief Complaint: Patient was seen in consultation today for basilar artery stenosis.  Referring Physician(s): Rosalin Hawking  Supervising Physician: Luanne Bras  Patient Status: [facility] - Out-pt  History of Present Illness: Devin [name] is a 47 y.o. male with a past medical history of hypertension, hyperlipidemia, collagen vascular disease, pericardial effusion 04/2006, CAD, CVA 11/2017, asthma, GERD, nephrolithiasis, renal insufficiency, hypothyroidism, diabetes mellitus type II, vitamin D deficiency, sickle cell anemia, multiple myeloma, rhabdomyolysis, and tobacco abuse. He is known to [facility] and has been followed by Dr. [name] since 11/2017. He first presented as a code stroke in 11/2017. He underwent an image-guided diagnostic cerebral angiogram 12/01/2017 with Dr. 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 He then underwent an image-guided cerebral angiogram with revascularization of his vertebrobasilar junction/proximal basilar artery stenosis using stent assisted angioplasty 12/03/2017 by Dr. [name]. He was discharged home 12/05/2017 in stable condition. He was doing well post-procedure, until 02/08/2018 when he presented to [facility] ED with complaint of acute onset of right-sided facial numbness. He was found to have an acute ischemic pontine stroke, and was transferred/admitted to [facility] for management. He underwent an image-guided diagnostic cerebral angiogram 02/13/2018 by Dr. [name]. He was discharged home in stable condition 02/13/2018.  Diagnostic cerebral angiogram 02/13/2018: 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.  Patient presents today for possible image-guided diagnostic cerebral angiogram with possible angioplasty/stent placement. Patient awake and alert laying in bed. Accompanied by brother-in-law. Complains of left-sided weakness, stable since discharge 02/13/2018. Denies  fever, chills, chest pain, dyspnea, abdominal pain, headache, dizziness, numbness/tingling, vision changes, hearing changes, or speech difficulty.  Patient is currently taking Brilinta 90 mg twice daily and Aspirin 81 mg once daily.   Past Medical History:  Diagnosis Date  . Asthma   . Chest pain   . Coronary artery disease    a. s/p DES x2 to LAD and DES to OM in 11/2017  . Depression   . Dyspnea   . GERD (gastroesophageal reflux disease)   . Headache   . History of kidney stones   . History of rhabdomyolysis   . Hyperlipidemia   . Hypertension   . Hypothyroidism   . Ischemic cardiomyopathy    a. 11/2017: echo showing EF of 35-40%, diffuse HK, and Grade 2 DD  . Kidney calculus 2014  . Pericardial effusion    Small, by dobutamine echocardiogram, 04/2006  . PONV (postoperative nausea and vomiting)   . Sleep apnea    does not wear cpap  . Stroke ([place])   . Tobacco abuse   . Type 2 diabetes mellitus without complications ([place]) 7/48/2707  . Vitamin D deficiency     Past Surgical History:  Procedure Laterality Date  . APPENDECTOMY    . 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 CHOLECYSTECTOMY N/A 01/27/2013   Procedure: LAPAROSCOPIC CHOLECYSTECTOMY;  Surgeon: Jamesetta So, MD;  Location: [facility];  Service: General;  Laterality: N/A;  . CORONARY STENT INTERVENTION N/A 11/15/2017   Procedure: CORONARY STENT INTERVENTION;  Surgeon: Leonie Man, MD;  Location: [place] [facility];  Service: Cardiovascular;  Laterality: N/A;  . HERNIA REPAIR     As a child  . INSERTION OF MESH N/A 11/13/2012   Procedure: INSERTION OF MESH;  Surgeon: Jamesetta So, MD;  Location: [facility];  Service: General;  Laterality: N/A;  .  IR ANGIO INTRA EXTRACRAN SEL COM CAROTID INNOMINATE BILAT MOD SED  12/01/2017  . IR ANGIO INTRA EXTRACRAN SEL COM CAROTID INNOMINATE BILAT MOD SED  02/13/2018  . IR ANGIO VERTEBRAL SEL  SUBCLAVIAN INNOMINATE UNI L MOD SED  02/13/2018  . IR ANGIO VERTEBRAL SEL VERTEBRAL BILAT MOD SED  12/01/2017  . IR ANGIO VERTEBRAL SEL VERTEBRAL UNI R MOD SED  02/13/2018  . IR INTRA CRAN STENT  12/03/2017  . IR US GUIDE VASC ACCESS RIGHT  02/13/2018  . KIDNEY STONE SURGERY    . LEFT HEART CATH AND CORONARY ANGIOGRAPHY N/A 11/15/2017   Procedure: LEFT HEART CATH AND CORONARY ANGIOGRAPHY;  Surgeon: Leonie Man, MD;  Location: [place] [facility];  Service: Cardiovascular;  Laterality: N/A;  . PERCUTANEOUS NEPHROLITHOTRIPSY    . RADIOLOGY WITH ANESTHESIA Left 12/03/2017   Procedure: Angioplasty with possible stenting of left VBJ;  Surgeon: Luanne Bras, MD;  Location: [place];  Service: Radiology;  Laterality: Left;  . UMBILICAL HERNIA REPAIR N/A 11/13/2012   Procedure: UMBILICAL HERNIORRHAPHY;  Surgeon: Jamesetta So, MD;  Location: [facility];  Service: General;  Laterality: N/A;  . VARICOCELECTOMY      Allergies: Crestor [rosuvastatin] and Bee venom  Medications: Prior to Admission medications   Medication Sig Start Date End Date Taking? Authorizing Provider  aspirin 81 MG EC tablet Take 1 tablet (81 mg total) by mouth daily. With food 12/05/17  Yes Emokpae, Courage, MD  citalopram (CELEXA) 10 MG tablet Take 1 tablet (10 mg total) by mouth daily. 01/04/18  Yes Venancio Poisson, NP  ezetimibe (ZETIA) 10 MG tablet Take 1 tablet (10 mg total) by mouth daily. 12/06/17  Yes Emokpae, Courage, MD  gabapentin (NEURONTIN) 300 MG capsule Take 1 capsule (300 mg total) by mouth 2 (two) times daily. 01/04/18  Yes Venancio Poisson, NP  glimepiride (AMARYL) 2 MG tablet Take 1 tablet (2 mg total) by mouth daily with breakfast. 11/16/17  Yes Patrecia Pour, MD  levothyroxine (SYNTHROID, LEVOTHROID) 175 MCG tablet Take 175 mcg by mouth daily before breakfast.   Yes [provider]  losartan (COZAAR) 25 MG tablet Take 0.5 tablets (12.5 mg total) by mouth daily. 01/15/18  Yes Strader, [place] M, PA-C    nitroGLYCERIN (NITROSTAT) 0.4 MG SL tablet Place 1 tablet (0.4 mg total) under the tongue every 5 (five) minutes as needed for chest pain. 01/15/18  Yes Strader, [place], PA-C  pantoprazole (PROTONIX) 40 MG tablet Take 40 mg by mouth every morning.   Yes [provider]  sitaGLIPtin-metformin (JANUMET) 50-1000 MG tablet Take 1 tablet by mouth 2 (two) times daily with a meal.   Yes [provider]  ticagrelor (BRILINTA) 90 MG TABS tablet Take 1 tablet (90 mg total) by mouth 2 (two) times daily. 02/13/18  Yes Rai, Ripudeep Raliegh Ip, MD  Blood Glucose Monitoring Suppl w/Device KIT Dispense based on patient and insurance preference. Check blood sugar daily before breakfast (ICD9 250.0) 04/29/17   Pollina, Gwenyth Allegra, MD  fenofibrate 160 MG tablet Take 1 tablet (160 mg total) by mouth daily. 02/13/18   Mendel Corning, MD     Family History  Problem Relation Age of Onset  . Coronary artery disease Father   . Hypertension Father   . Hyperlipidemia Father   . Diabetes Father   . Congestive Heart Failure Father 59  . Arrhythmia Father        had an ICD  . Diabetes Mother   . Hypertension Mother   .  Hyperlipidemia Mother   . Obesity Mother 13       died after bariatric surgery, liver failure and infection  . Coronary artery disease Maternal Grandfather        both grandfathers and several uncles  . Coronary artery disease Other   . Diabetes Sister        both sisters  . Stroke Maternal Aunt   . Stroke Maternal Uncle     Social History   Socioeconomic History  . Marital status: Divorced    Spouse name: Not on file  . Number of children: Not on file  . Years of education: Not on file  . Highest education level: Not on file  Occupational History    Comment:      Employer: [employer]  Social Needs  . Financial resource strain: Not on file  . Food insecurity:    Worry: Not on file    Inability: Not on file  . Transportation needs:    Medical: Not on file     Non-medical: Not on file  Tobacco Use  . Smoking status: Current Some Day Smoker    Packs/day: 0.50    Years: 26.00    Pack years: 13.00    Types: Cigarettes    Start date: 03/13/1985  . Smokeless tobacco: Former Systems developer    Types: Snuff, Sarina Ser    Quit date: 03/14/1987  Substance and Sexual Activity  . Alcohol use: No  . Drug use: No  . Sexual activity: Not on file  Lifestyle  . Physical activity:    Days per week: Not on file    Minutes per session: Not on file  . Stress: Not on file  Relationships  . Social connections:    Talks on phone: Not on file    Gets together: Not on file    Attends religious service: Not on file    Active member of club or organization: Not on file    Attends meetings of clubs or organizations: Not on file    Relationship status: Not on file  Other Topics Concern  . Not on file  Social History Narrative   Eats fast food daily   No exercise outside of work   Lives in [place] with his dog   MAINTENANCE TECHNICIAN     Review of Systems: A 12 point ROS discussed and pertinent positives are indicated in the HPI above.  All other systems are negative.  Review of Systems  Constitutional: Negative for chills and fever.  HENT: Negative for hearing loss and tinnitus.   Eyes: Negative for visual disturbance.  Respiratory: Negative for shortness of breath and wheezing.   Cardiovascular: Negative for chest pain and palpitations.  Gastrointestinal: Negative for abdominal pain.  Neurological: Positive for weakness. Negative for dizziness, speech difficulty, numbness and headaches.  Psychiatric/Behavioral: Negative for behavioral problems and confusion.    Vital Signs: There were no vitals taken for this visit.  Physical Exam Constitutional:      General: He is not in acute distress.    Appearance: Normal appearance.  Cardiovascular:     Rate and Rhythm: Normal rate and regular rhythm.     Heart sounds: Normal heart sounds. No murmur.  Pulmonary:      Effort: Pulmonary effort is normal. No respiratory distress.     Breath sounds: Normal breath sounds. No wheezing.  Skin:    General: Skin is warm and dry.  Neurological:     Mental Status: He is alert and oriented to  person, place, and time. Mental status is at baseline.  Psychiatric:        Mood and Affect: Mood normal.        Behavior: Behavior normal.        Thought Content: Thought content normal.        Judgment: Judgment normal.      MD Evaluation Airway: WNL Heart: WNL Abdomen: WNL Chest/ Lungs: WNL ASA  Classification: 3 Mallampati/Airway Score: Two   Imaging: Ct Angio Head W Or Wo Contrast  Result Date: 02/08/2018 CLINICAL DATA:  Initial evaluation for acute right facial numbness. EXAM: CT ANGIOGRAPHY HEAD AND NECK CT PERFUSION BRAIN TECHNIQUE: Multidetector CT imaging of the head and neck was performed using the standard protocol during bolus administration of intravenous contrast. Multiplanar CT image reconstructions and MIPs were obtained to evaluate the vascular anatomy. Carotid stenosis measurements (when applicable) are obtained utilizing NASCET criteria, using the distal internal carotid diameter as the denominator. Multiphase CT imaging of the brain was performed following IV bolus contrast injection. Subsequent parametric perfusion maps were calculated using RAPID software. CONTRAST:  185m ISOVUE-370 IOPAMIDOL (ISOVUE-370) INJECTION 76% COMPARISON:  Prior CT from earlier the same day. Comparison also made with prior CTA from 11/30/2017 and arteriogram from 12/03/2017 FINDINGS: CTA NECK FINDINGS Aortic arch: Visualized aortic arch of normal caliber with normal 3 vessel morphology. No flow-limiting stenosis about the origin of the great vessels. Visualized subclavian arteries widely patent. Right carotid system: Right common carotid artery patent from its origin to the bifurcation without hemodynamically significant stenosis. Concentric mixed plaque about the right  bifurcation with associated mild stenosis of up to 35% by NASCET criteria. Right ICA otherwise widely patent to the skull base without stenosis, dissection, or occlusion. Left carotid system: Left common carotid artery patent from its origin to the bifurcation without stenosis. Mixed but predominantly soft plaque at the left bifurcation/proximal left ICA with associated stenosis of up to 60% by NASCET criteria (series 8, image 148). Left ICA widely patent distally to the skull base without additional stenosis, dissection, or occlusion. Vertebral arteries: Both of the vertebral arteries arise from the subclavian arteries. Mild narrowing at the origin of the left vertebral artery. Vertebral arteries otherwise widely patent within the neck without stenosis, dissection, or occlusion. Skeleton: No acute osseous abnormality. No discrete lytic or blastic osseous lesions. 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 Poor dentition with prominent dental carie at the left first mandibular molar. Other neck: No other acute soft tissue abnormality within the neck. Upper chest: Visualized upper chest demonstrates no acute finding. Partially visualized lungs are grossly clear. Mild upper lobe predominant paraseptal emphysema. Review of the MIP images confirms the above findings CTA HEAD FINDINGS Anterior circulation: Internal carotid arteries widely patent to the termini without hemodynamically significant stenosis. ICA termini well perfused and symmetric. A1 segments widely patent bilaterally. Normal anterior communicating artery. Anterior cerebral arteries widely patent to their distal aspects without stenosis. No M1 stenosis or occlusion. Normal MCA bifurcations. Distal MCA branches well perfused and symmetric. Posterior circulation: Vertebral arteries are co dominant and widely patent as they course into the cranial vault. Severe near occlusive stenosis at the distal left V4 segment just prior to the  vertebrobasilar junction (series 7, image 151). Right PICA patent proximally. Moderate multifocal atheromatous irregularity within the proximal left V4 segment with up to moderate approximate 50% stenosis. Vascular stent in place distally within the left V4 segment, extending distally to the proximal-mid basilar artery (series 7, image 149-proximal aspect,  series 7 image 128-distal aspect). Possible stenoses at the stent margins difficult to evaluate given adjacent stent markers. There is wide patency of the stent through the distal left vertebral artery and proximal basilar artery, markedly improved relative to prior CTA. Attenuated but patent flow within the proximal-mid basilar artery distal to the takeoff of the anterior inferior cerebellar arteries, with approximate moderate diffuse narrowing (series 8, image 123 coronal, series 7, image 133 axial). Basilar moderately narrowed just distal to the stent by approximately 50-60%, likely similar to previous CTA (series 7, image 124). Superior cerebellar arteries irregular but patent at their origins. Both of the PCAs appear to be primarily supplied via the basilar. Extensive atheromatous irregularity with short-segment moderate to severe proximal P1 stenoses (series 8, image 115 on the left, image 116 on the right). PCAs are patent distally but attenuated with extensive atheromatous irregularity. Venous sinuses: Patent. Anatomic variants: None significant. Delayed phase: No pathologic enhancement. Chronic left cerebellar infarcts noted. Review of the MIP images confirms the above findings CT Brain Perfusion Findings: CBF (<30%) Volume: 79m Perfusion (Tmax>6.0s) volume: 119mMismatch Volume: 1147mnfarction Location:Negative CT perfusion for core infarct. Patchy perfusion deficit at the mid cerebellar region, likely related to right V4 stenosis and possibly delayed flow through the stent. IMPRESSION: 1. Negative CTA for large vessel occlusion. 2. No evidence for core  infarct by CT perfusion. Mild perfusion deficit within the posterior fossa likely related to bilateral V4 stenoses. 3. 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 Widely patent flow through the proximal and mid aspect of the stent. Attenuated flow with up to approximate 50% narrowing through the distal aspect of the stent, distal to the anterior inferior cerebral arteries. Basilar artery is patent distally, with additional moderate to advanced atheromatous irregularity involving the SCAs and PCAs bilaterally. 4. 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 5. Mild 35% atheromatous stenosis at the origin of the right ICA. 6. Widely patent anterior circulation within the head. Critical Value/emergent results were called by telephone at the time of interpretation on 02/08/2018 at 11:00 pm to Dr. MICGerlene Feewho verbally acknowledged these results. Electronically Signed   By: BenJeannine BogaD.   On: 02/08/2018 23:33   Ct Angio Neck W And/or Wo Contrast  Result Date: 02/08/2018 CLINICAL DATA:  Initial evaluation for acute right facial numbness. EXAM: CT ANGIOGRAPHY HEAD AND NECK CT PERFUSION BRAIN TECHNIQUE: Multidetector CT imaging of the head and neck was performed using the standard protocol during bolus administration of intravenous contrast. Multiplanar CT image reconstructions and MIPs were obtained to evaluate the vascular anatomy. Carotid stenosis measurements (when applicable) are obtained utilizing NASCET criteria, using the distal internal carotid diameter as the denominator. Multiphase CT imaging of the brain was performed following IV bolus contrast injection. Subsequent parametric perfusion maps were calculated using RAPID software. CONTRAST:  125m49mOVUE-370 IOPAMIDOL (ISOVUE-370) INJECTION 76% COMPARISON:  Prior CT from earlier the same day. Comparison also made with prior CTA from 11/30/2017 and arteriogram from 12/03/2017  FINDINGS: CTA NECK FINDINGS Aortic arch: Visualized aortic arch of normal caliber with normal 3 vessel morphology. No flow-limiting stenosis about the origin of the great vessels. Visualized subclavian arteries widely patent. Right carotid system: Right common carotid artery patent from its origin to the bifurcation without hemodynamically significant stenosis. Concentric mixed plaque about the right bifurcation with associated mild stenosis of up to 35% by NASCET criteria. Right ICA otherwise widely patent to  the skull base without stenosis, dissection, or occlusion. Left carotid system: Left common carotid artery patent from its origin to the bifurcation without stenosis. Mixed but predominantly soft plaque at the left bifurcation/proximal left ICA with associated stenosis of up to 60% by NASCET criteria (series 8, image 148). Left ICA widely patent distally to the skull base without additional stenosis, dissection, or occlusion. Vertebral arteries: Both of the vertebral arteries arise from the subclavian arteries. Mild narrowing at the origin of the left vertebral artery. Vertebral arteries otherwise widely patent within the neck without stenosis, dissection, or occlusion. Skeleton: No acute osseous abnormality. No discrete lytic or blastic osseous lesions. 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 Poor dentition with prominent dental carie at the left first mandibular molar. Other neck: No other acute soft tissue abnormality within the neck. Upper chest: Visualized upper chest demonstrates no acute finding. Partially visualized lungs are grossly clear. Mild upper lobe predominant paraseptal emphysema. Review of the MIP images confirms the above findings CTA HEAD FINDINGS Anterior circulation: Internal carotid arteries widely patent to the termini without hemodynamically significant stenosis. ICA termini well perfused and symmetric. A1 segments widely patent bilaterally. Normal anterior  communicating artery. Anterior cerebral arteries widely patent to their distal aspects without stenosis. No M1 stenosis or occlusion. Normal MCA bifurcations. Distal MCA branches well perfused and symmetric. Posterior circulation: Vertebral arteries are co dominant and widely patent as they course into the cranial vault. Severe near occlusive stenosis at the distal left V4 segment just prior to the vertebrobasilar junction (series 7, image 151). Right PICA patent proximally. Moderate multifocal atheromatous irregularity within the proximal left V4 segment with up to moderate approximate 50% stenosis. Vascular stent in place distally within the left V4 segment, extending distally to the proximal-mid basilar artery (series 7, image 149-proximal aspect, series 7 image 128-distal aspect). Possible stenoses at the stent margins difficult to evaluate given adjacent stent markers. There is wide patency of the stent through the distal left vertebral artery and proximal basilar artery, markedly improved relative to prior CTA. Attenuated but patent flow within the proximal-mid basilar artery distal to the takeoff of the anterior inferior cerebellar arteries, with approximate moderate diffuse narrowing (series 8, image 123 coronal, series 7, image 133 axial). Basilar moderately narrowed just distal to the stent by approximately 50-60%, likely similar to previous CTA (series 7, image 124). Superior cerebellar arteries irregular but patent at their origins. Both of the PCAs appear to be primarily supplied via the basilar. Extensive atheromatous irregularity with short-segment moderate to severe proximal P1 stenoses (series 8, image 115 on the left, image 116 on the right). PCAs are patent distally but attenuated with extensive atheromatous irregularity. Venous sinuses: Patent. Anatomic variants: None significant. Delayed phase: No pathologic enhancement. Chronic left cerebellar infarcts noted. Review of the MIP images confirms  the above findings CT Brain Perfusion Findings: CBF (<30%) Volume: 55m Perfusion (Tmax>6.0s) volume: 171mMismatch Volume: 1177mnfarction Location:Negative CT perfusion for core infarct. Patchy perfusion deficit at the mid cerebellar region, likely related to right V4 stenosis and possibly delayed flow through the stent. IMPRESSION: 1. Negative CTA for large vessel occlusion. 2. No evidence for core infarct by CT perfusion. Mild perfusion deficit within the posterior fossa likely related to bilateral V4 stenoses. 3. 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 Widely patent flow through the proximal and mid aspect of the stent. Attenuated flow with up to approximate 50% narrowing through the distal aspect of  the stent, distal to the anterior inferior cerebral arteries. Basilar artery is patent distally, with additional moderate to advanced atheromatous irregularity involving the SCAs and PCAs bilaterally. 4. 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 5. Mild 35% atheromatous stenosis at the origin of the right ICA. 6. Widely patent anterior circulation within the head. Critical Value/emergent results were called by telephone at the time of interpretation on 02/08/2018 at 11:00 pm to Dr. Gerlene Fee , who verbally acknowledged these results. Electronically Signed   By: Jeannine Boga M.D.   On: 02/08/2018 23:33   Mr Brain Wo Contrast  Result Date: 02/09/2018 CLINICAL DATA:  Initial evaluation for acute headache with right-sided facial numbness. EXAM: MRI HEAD WITHOUT CONTRAST TECHNIQUE: Multiplanar, multiecho pulse sequences of the brain and surrounding structures were obtained without intravenous contrast. COMPARISON:  Prior CT and CTA from 02/08/2018 FINDINGS: Brain: Generalized age-related cerebral atrophy. Mild chronic microvascular ischemic disease noted involving the pons. Chronic left cerebellar infarct. Single 5 mm focus of  diffusion abnormality seen within the right paramedian pons (series 5, image 66), suspicious for a small acute to early subacute ischemic infarct. No associated hemorrhage. No other evidence for acute or subacute ischemia. Gray-white matter differentiation otherwise maintained. No other areas of chronic cortical infarction. No acute intracranial hemorrhage. Small amount of chronic hemosiderin staining noted about the chronic left cerebellar infarct. No mass lesion, midline shift or mass effect. No hydrocephalus. No extra-axial fluid collection. Pituitary gland within normal limits. 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 Major intracranial vascular flow voids otherwise maintained. Skull and upper cervical spine: Craniocervical junction within normal limits. Bone marrow signal intensity normal. No scalp soft tissue abnormality. Sinuses/Orbits: Globes and orbital soft tissues within normal limits. Scattered mucosal thickening and opacity within the ethmoidal air cells. Paranasal sinuses are otherwise clear. No significant mastoid effusion. Inner ear structures grossly normal. Other: None. IMPRESSION: 1. Punctate 5 mm focus of diffusion abnormality involving the right paramedian pons, suspicious for a small acute to early subacute small vessel type infarct. No associated hemorrhage. 2. No other acute intracranial abnormality. 3. Small chronic left cerebellar infarct. Electronically Signed   By: Jeannine Boga M.D.   On: 02/09/2018 05:44   Ir US Guide Vasc Access Right  Result Date: 02/18/2018 CLINICAL DATA:  Previous history of severe bilateral vertebrobasilar disease. Status post assisted angioplasty of the left vertebrobasilar junction and the proximal basilar artery. Patient with new symptoms of transient incoordination and abnormal MRI of the brain.  EXAM: IR ANGIO VERTEBRAL SEL SUBCLAVIAN  INNOMINATE UNI LEFT MOD SED; BILATERAL COMMON CAROTID AND INNOMINATE ANGIOGRAPHY; IR ANGIO VERTEBRAL SEL VERTEBRAL UNI RIGHT MOD SED  COMPARISON:  MRI MRA of the brain of 01/06/2018, and CT angiogram of 02/08/2018, and MRI of the brain of 02/09/2018.  MEDICATIONS: Heparin 2000 units IV; no antibiotic was administered within 1 hour of the procedure.  ANESTHESIA/SEDATION: Versed 1 mg IV; Fentanyl 25 mcg IV  Moderate Sedation Time:  39 minutes  The patient was continuously monitored during the procedure by the interventional radiology nurse under my direct supervision.  CONTRAST:  Isovue 300 approximately 60 mL  FLUOROSCOPY TIME:  Fluoroscopy Time: 12 minutes 54 seconds (857 mGy).  COMPLICATIONS: None immediate.  TECHNIQUE: Informed written consent was obtained from the patient after a thorough discussion of the procedural risks, benefits and alternatives. All questions were addressed. Maximal Sterile Barrier Technique was utilized  including caps, mask, sterile gowns, sterile gloves, sterile drape, hand hygiene and skin antiseptic. A timeout was performed prior to the initiation of the procedure.  The right forearm to the wrist was prepped and draped in the usual sterile fashion. The right radial artery was then identified under ultrasound and accessed following the application of 389 mcg of nitroglycerin mixed with 1% lidocaine to the overlying skin surface.  Thereafter, access was obtained using a pediatric needle into the radial artery. The 0.018 inch wire was then advanced without any difficulty followed by the advancement of a 4/5 French radial sheath.  Free aspiration of blood was noted at the obturator of the sheath. 2.5 mg of verapamil and 100 mcg of nitroglycerin mixed with blood were then slowly injected through the obturator.  An arteriogram was then performed through the obturator which demonstrated wide patency of the radial artery.  Over a 0.035 inch Roadrunner guidewire, a 5 [place] Simmons  2 diagnostic catheter was then advanced without difficulty to the subclavian artery. The guidewire was removed. An arteriogram was then performed just proximal to the right vertebral artery. The diagnostic catheter was then advanced into the descending thoracic aorta where the [facility] catheter was then formed without difficulty. Selective cannulation was then performed of the left subclavian artery, the left common carotid artery, the right common carotid artery. Also performed were selective cannulation and arteriogram of the right vertebral artery.  Following the procedure, the radial sheath was removed with the successful application of a wrist band. Distal radial pulse was present following the application of the wrist band.  FINDINGS: The right vertebral artery demonstrates mild stenosis at its origin.  There is opacification of the right vertebral artery to the vertebrobasilar junction with complete angiographic occlusion noted just distal to the origin of the right posterior-inferior cerebellar artery. The left subclavian arteriogram demonstrates mild stenosis at the origin of the left vertebral artery.  This is the dominant vertebral artery which is seen to opacify to the cranial skull base.  Patency is seen of the left vertebrobasilar junction though with approximately 50% stenosis proximal to the left posterior-inferior cerebellar artery.  Distal to this, the previously positioned stent is noted extending from the left vertebrobasilar junction to the distal basilar artery.  There has been significant improvement in the caliber of the basilar artery proximally and the left vertebrobasilar junction. However, at the distal portion of the stented segment there was severe stenosis with flow noted into the distal basilar artery and the superior cerebellar arteries.  The left common carotid arteriogram demonstrates mild narrowing at the origin of the left external carotid artery. Its branches opacify  normally.  The left internal carotid artery at the bulb and just proximally has a smooth atherosclerotic plaque which extends into the bulb itself along the posterolateral wall.  There is an associated stenosis of approximately 20% by the NASCET criteria.  More distally, the left internal carotid artery is seen to opacify to the cranial skull base with a focal stenosis of approximately 50% at the cervical petrous junction on the lateral projection.  Distal to this wide patency is seen of the petrous, cavernous and supraclinoid segments.  The left middle cerebral artery and the left anterior cerebral artery opacify normally into the capillary and venous phases.  The right common carotid arteriogram demonstrates wide patency at the origin of the right external carotid artery. Its branches are normally opacified.  The right internal carotid artery at the bulb to the cranial skull base  opacifies widely. The petrous, cavernous and supraclinoid segments are widely patent. The right middle cerebral artery and the right anterior cerebral artery opacify into the capillary and venous phases.  Also noted is partial retrograde opacification via the diminutive right posterior communicating artery of the distal basilar artery and the right posterior cerebral artery.  IMPRESSION: Severe stenosis within the previously positioned stent in the vertebrobasilar junction extending into the basilar artery.  Interval significant improvement of the 2 tandem lesions in the more proximal basilar artery and the left vertebrobasilar junction.  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  Occluded right vertebrobasilar junction just distal to the right posterior-inferior cerebellar artery.  PLAN: Findings reviewed with patient, the spouse, and the referring neurologist. Options of continued medical therapy with dual antiplatelets, or consideration of a balloon angioplasty of the severe  stenosis of the distal portion of the stented segment of the basilar artery discussed. It was decided to pursue with balloon angioplasty of the distal intra stent severe stenosis of the basilar artery given the patient's failure on the current dual anti-platelet regimen.   Electronically Signed   By: Luanne Bras M.D.   On: 02/14/2018 10:58  Ct Cerebral Perfusion W Contrast  Result Date: 02/08/2018 CLINICAL DATA:  Initial evaluation for acute right facial numbness. EXAM: CT ANGIOGRAPHY HEAD AND NECK CT PERFUSION BRAIN TECHNIQUE: Multidetector CT imaging of the head and neck was performed using the standard protocol during bolus administration of intravenous contrast. Multiplanar CT image reconstructions and MIPs were obtained to evaluate the vascular anatomy. Carotid stenosis measurements (when applicable) are obtained utilizing NASCET criteria, using the distal internal carotid diameter as the denominator. Multiphase CT imaging of the brain was performed following IV bolus contrast injection. Subsequent parametric perfusion maps were calculated using RAPID software. CONTRAST:  176m ISOVUE-370 IOPAMIDOL (ISOVUE-370) INJECTION 76% COMPARISON:  Prior CT from earlier the same day. Comparison also made with prior CTA from 11/30/2017 and arteriogram from 12/03/2017 FINDINGS: CTA NECK FINDINGS Aortic arch: Visualized aortic arch of normal caliber with normal 3 vessel morphology. No flow-limiting stenosis about the origin of the great vessels. Visualized subclavian arteries widely patent. Right carotid system: Right common carotid artery patent from its origin to the bifurcation without hemodynamically significant stenosis. Concentric mixed plaque about the right bifurcation with associated mild stenosis of up to 35% by NASCET criteria. Right ICA otherwise widely patent to the skull base without stenosis, dissection, or occlusion. Left carotid system: Left common carotid artery patent from its origin to the  bifurcation without stenosis. Mixed but predominantly soft plaque at the left bifurcation/proximal left ICA with associated stenosis of up to 60% by NASCET criteria (series 8, image 148). Left ICA widely patent distally to the skull base without additional stenosis, dissection, or occlusion. Vertebral arteries: Both of the vertebral arteries arise from the subclavian arteries. Mild narrowing at the origin of the left vertebral artery. Vertebral arteries otherwise widely patent within the neck without stenosis, dissection, or occlusion. Skeleton: No acute osseous abnormality. No discrete lytic or blastic osseous lesions. 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 Poor dentition with prominent dental carie at the left first mandibular molar. Other neck: No other acute soft tissue abnormality within the neck. Upper chest: Visualized upper chest demonstrates no acute finding. Partially visualized lungs are grossly clear. Mild upper lobe predominant paraseptal emphysema. Review of the MIP images confirms the above findings CTA HEAD FINDINGS Anterior circulation: Internal carotid arteries widely patent to the termini  without hemodynamically significant stenosis. ICA termini well perfused and symmetric. A1 segments widely patent bilaterally. Normal anterior communicating artery. Anterior cerebral arteries widely patent to their distal aspects without stenosis. No M1 stenosis or occlusion. Normal MCA bifurcations. Distal MCA branches well perfused and symmetric. Posterior circulation: Vertebral arteries are co dominant and widely patent as they course into the cranial vault. Severe near occlusive stenosis at the distal left V4 segment just prior to the vertebrobasilar junction (series 7, image 151). Right PICA patent proximally. Moderate multifocal atheromatous irregularity within the proximal left V4 segment with up to moderate approximate 50% stenosis. Vascular stent in place distally within the left V4  segment, extending distally to the proximal-mid basilar artery (series 7, image 149-proximal aspect, series 7 image 128-distal aspect). Possible stenoses at the stent margins difficult to evaluate given adjacent stent markers. There is wide patency of the stent through the distal left vertebral artery and proximal basilar artery, markedly improved relative to prior CTA. Attenuated but patent flow within the proximal-mid basilar artery distal to the takeoff of the anterior inferior cerebellar arteries, with approximate moderate diffuse narrowing (series 8, image 123 coronal, series 7, image 133 axial). Basilar moderately narrowed just distal to the stent by approximately 50-60%, likely similar to previous CTA (series 7, image 124). Superior cerebellar arteries irregular but patent at their origins. Both of the PCAs appear to be primarily supplied via the basilar. Extensive atheromatous irregularity with short-segment moderate to severe proximal P1 stenoses (series 8, image 115 on the left, image 116 on the right). PCAs are patent distally but attenuated with extensive atheromatous irregularity. Venous sinuses: Patent. Anatomic variants: None significant. Delayed phase: No pathologic enhancement. Chronic left cerebellar infarcts noted. Review of the MIP images confirms the above findings CT Brain Perfusion Findings: CBF (<30%) Volume: 68m Perfusion (Tmax>6.0s) volume: 117mMismatch Volume: 1174mnfarction Location:Negative CT perfusion for core infarct. Patchy perfusion deficit at the mid cerebellar region, likely related to right V4 stenosis and possibly delayed flow through the stent. IMPRESSION: 1. Negative CTA for large vessel occlusion. 2. No evidence for core infarct by CT perfusion. Mild perfusion deficit within the posterior fossa likely related to bilateral V4 stenoses. 3. 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 Widely patent flow through the proximal  and mid aspect of the stent. Attenuated flow with up to approximate 50% narrowing through the distal aspect of the stent, distal to the anterior inferior cerebral arteries. Basilar artery is patent distally, with additional moderate to advanced atheromatous irregularity involving the SCAs and PCAs bilaterally. 4. 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 5. Mild 35% atheromatous stenosis at the origin of the right ICA. 6. Widely patent anterior circulation within the head. Critical Value/emergent results were called by telephone at the time of interpretation on 02/08/2018 at 11:00 pm to Dr. MICGerlene Feewho verbally acknowledged these results. Electronically Signed   By: BenJeannine BogaD.   On: 02/08/2018 23:33   Ct Head Code Stroke Wo Contrast  Result Date: 02/08/2018 CLINICAL DATA:  Code stroke.  Right-sided facial numbness EXAM: CT HEAD WITHOUT CONTRAST TECHNIQUE: Contiguous axial images were obtained from the base of the skull through the vertex without intravenous contrast. COMPARISON:  None. FINDINGS: Brain: There is no mass, hemorrhage or extra-axial collection. The size and configuration of the ventricles and extra-axial CSF spaces are normal. There is hypoattenuation of the periventricular white matter, most commonly indicating chronic ischemic microangiopathy. Vascular:  No abnormal hyperdensity of the major intracranial arteries or dural venous sinuses. No intracranial atherosclerosis. Skull: Right parietal scalp hematoma.  No skull fracture. Sinuses/Orbits: No fluid levels or advanced mucosal thickening of the visualized paranasal sinuses. No mastoid or middle ear effusion. The orbits are normal. ASPECTS [facility] Stroke Program Early CT Score) - Ganglionic level infarction (caudate, lentiform nuclei, internal capsule, insula, M1-M3 cortex): 7 - Supraganglionic infarction (M4-M6 cortex): 3 Total score (0-10 with 10 being normal): 10 IMPRESSION: 1. No acute intracranial  abnormality. 2. ASPECTS is 10. 3. Right parietal scalp hematoma without skull fracture. * These results were called by telephone at the time of interpretation on 02/08/2018 at 8:58 pm to Dr. Gerlene Fee , who verbally acknowledged these results. Electronically Signed   By: Ulyses Jarred M.D.   On: 02/08/2018 20:59   Ir Angio Intra Extracran Sel Com Carotid Innominate Bilat Mod Sed  Result Date: 02/15/2018 CLINICAL DATA:  Previous history of severe bilateral vertebrobasilar disease. Status post assisted angioplasty of the left vertebrobasilar junction and the proximal basilar artery. Patient with new symptoms of transient incoordination and abnormal MRI of the brain. EXAM: IR ANGIO VERTEBRAL SEL SUBCLAVIAN INNOMINATE UNI LEFT MOD SED; BILATERAL COMMON CAROTID AND INNOMINATE ANGIOGRAPHY; IR ANGIO VERTEBRAL SEL VERTEBRAL UNI RIGHT MOD SED COMPARISON:  MRI MRA of the brain of 01/06/2018, and CT angiogram of 02/08/2018, and MRI of the brain of 02/09/2018. MEDICATIONS: Heparin 2000 units IV; no antibiotic was administered within 1 hour of the procedure. ANESTHESIA/SEDATION: Versed 1 mg IV; Fentanyl 25 mcg IV Moderate Sedation Time:  39 minutes The patient was continuously monitored during the procedure by the interventional radiology nurse under my direct supervision. CONTRAST:  Isovue 300 approximately 60 mL FLUOROSCOPY TIME:  Fluoroscopy Time: 12 minutes 54 seconds (857 mGy). COMPLICATIONS: None immediate. TECHNIQUE: Informed written consent was obtained from the patient after a thorough discussion of the procedural risks, benefits and alternatives. All questions were addressed. Maximal Sterile Barrier Technique was utilized including caps, mask, sterile gowns, sterile gloves, sterile drape, hand hygiene and skin antiseptic. A timeout was performed prior to the initiation of the procedure. The right forearm to the wrist was prepped and draped in the usual sterile fashion. The right radial artery was then identified  under ultrasound and accessed following the application of 151 mcg of nitroglycerin mixed with 1% lidocaine to the overlying skin surface. Thereafter, access was obtained using a pediatric needle into the radial artery. The 0.018 inch wire was then advanced without any difficulty followed by the advancement of a 4/5 French radial sheath. Free aspiration of blood was noted at the obturator of the sheath. 2.5 mg of verapamil and 100 mcg of nitroglycerin mixed with blood were then slowly injected through the obturator. An arteriogram was then performed through the obturator which demonstrated wide patency of the radial artery. Over a 0.035 inch Roadrunner guidewire, a 5 [place] Simmons 2 diagnostic catheter was then advanced without difficulty to the subclavian artery. The guidewire was removed. An arteriogram was then performed just proximal to the right vertebral artery. The diagnostic catheter was then advanced into the descending thoracic aorta where the [facility] catheter was then formed without difficulty. Selective cannulation was then performed of the left subclavian artery, the left common carotid artery, the right common carotid artery. Also performed were selective cannulation and arteriogram of the right vertebral artery. Following the procedure, the radial sheath was removed with the successful application of a wrist band. Distal radial pulse was present following  the application of the wrist band. FINDINGS: The right vertebral artery demonstrates mild stenosis at its origin. There is opacification of the right vertebral artery to the vertebrobasilar junction with complete angiographic occlusion noted just distal to the origin of the right posterior-inferior cerebellar artery. The left subclavian arteriogram demonstrates mild stenosis at the origin of the left vertebral artery. This is the dominant vertebral artery which is seen to opacify to the cranial skull base. Patency is seen of the left vertebrobasilar  junction though with approximately 50% stenosis proximal to the left posterior-inferior cerebellar artery. Distal to this, the previously positioned stent is noted extending from the left vertebrobasilar junction to the distal basilar artery. There has been significant improvement in the caliber of the basilar artery proximally and the left vertebrobasilar junction. However, at the distal portion of the stented segment there was severe stenosis with flow noted into the distal basilar artery and the superior cerebellar arteries. The left common carotid arteriogram demonstrates mild narrowing at the origin of the left external carotid artery. Its branches opacify normally. The left internal carotid artery at the bulb and just proximally has a smooth atherosclerotic plaque which extends into the bulb itself along the posterolateral wall. There is an associated stenosis of approximately 20% by the NASCET criteria. More distally, the left internal carotid artery is seen to opacify to the cranial skull base with a focal stenosis of approximately 50% at the cervical petrous junction on the lateral projection. Distal to this wide patency is seen of the petrous, cavernous and supraclinoid segments. The left middle cerebral artery and the left anterior cerebral artery opacify normally into the capillary and venous phases. The right common carotid arteriogram demonstrates wide patency at the origin of the right external carotid artery. Its branches are normally opacified. The right internal carotid artery at the bulb to the cranial skull base opacifies widely. The petrous, cavernous and supraclinoid segments are widely patent. The right middle cerebral artery and the right anterior cerebral artery opacify into the capillary and venous phases. Also noted is partial retrograde opacification via the diminutive right posterior communicating artery of the distal basilar artery and the right posterior cerebral artery. 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 Interval significant improvement of the 2 tandem lesions in the more proximal basilar artery and the left vertebrobasilar junction. 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 Occluded right vertebrobasilar junction just distal to the right posterior-inferior cerebellar artery. PLAN: Findings reviewed with patient, the spouse, and the referring neurologist. Options of continued medical therapy with dual antiplatelets, or consideration of a balloon angioplasty of the severe stenosis of the distal portion of the stented segment of the basilar artery discussed. It was decided to pursue with balloon angioplasty of the distal intra stent severe stenosis of the basilar artery given the patient's failure on the current dual anti-platelet regimen. Electronically Signed   By: Luanne Bras M.D.   On: 02/14/2018 10:58   Ir Angio Vertebral Sel Subclavian Innominate Uni L Mod Sed  Result Date: 02/15/2018 CLINICAL DATA:  Previous history of severe bilateral vertebrobasilar disease. Status post assisted angioplasty of the left vertebrobasilar junction and the proximal basilar artery. Patient with new symptoms of transient incoordination and abnormal MRI of the brain. EXAM: IR ANGIO VERTEBRAL SEL SUBCLAVIAN INNOMINATE UNI LEFT MOD SED; BILATERAL COMMON CAROTID AND INNOMINATE ANGIOGRAPHY; IR ANGIO VERTEBRAL SEL VERTEBRAL UNI RIGHT MOD SED COMPARISON:  MRI MRA of the brain of 01/06/2018, and CT angiogram of 02/08/2018, and MRI of the brain of 02/09/2018. MEDICATIONS: Heparin 2000 units IV; no antibiotic was administered within 1 hour of the procedure. ANESTHESIA/SEDATION: Versed 1 mg IV; Fentanyl 25 mcg IV Moderate Sedation Time:  39 minutes The patient was continuously monitored during the procedure by the interventional radiology nurse under my direct  supervision. CONTRAST:  Isovue 300 approximately 60 mL FLUOROSCOPY TIME:  Fluoroscopy Time: 12 minutes 54 seconds (857 mGy). COMPLICATIONS: None immediate. TECHNIQUE: Informed written consent was obtained from the patient after a thorough discussion of the procedural risks, benefits and alternatives. All questions were addressed. Maximal Sterile Barrier Technique was utilized including caps, mask, sterile gowns, sterile gloves, sterile drape, hand hygiene and skin antiseptic. A timeout was performed prior to the initiation of the procedure. The right forearm to the wrist was prepped and draped in the usual sterile fashion. The right radial artery was then identified under ultrasound and accessed following the application of 540 mcg of nitroglycerin mixed with 1% lidocaine to the overlying skin surface. Thereafter, access was obtained using a pediatric needle into the radial artery. The 0.018 inch wire was then advanced without any difficulty followed by the advancement of a 4/5 French radial sheath. Free aspiration of blood was noted at the obturator of the sheath. 2.5 mg of verapamil and 100 mcg of nitroglycerin mixed with blood were then slowly injected through the obturator. An arteriogram was then performed through the obturator which demonstrated wide patency of the radial artery. Over a 0.035 inch Roadrunner guidewire, a 5 [place] Simmons 2 diagnostic catheter was then advanced without difficulty to the subclavian artery. The guidewire was removed. An arteriogram was then performed just proximal to the right vertebral artery. The diagnostic catheter was then advanced into the descending thoracic aorta where the [facility] catheter was then formed without difficulty. Selective cannulation was then performed of the left subclavian artery, the left common carotid artery, the right common carotid artery. Also performed were selective cannulation and arteriogram of the right vertebral artery. Following the procedure,  the radial sheath was removed with the successful application of a wrist band. Distal radial pulse was present following the application of the wrist band. FINDINGS: The right vertebral artery demonstrates mild stenosis at its origin. There is opacification of the right vertebral artery to the vertebrobasilar junction with complete angiographic occlusion noted just distal to the origin of the right posterior-inferior cerebellar artery. The left subclavian arteriogram demonstrates mild stenosis at the origin of the left vertebral artery. This is the dominant vertebral artery which is seen to opacify to the cranial skull base. Patency is seen of the left vertebrobasilar junction though with approximately 50% stenosis proximal to the left posterior-inferior cerebellar artery. Distal to this, the previously positioned stent is noted extending from the left vertebrobasilar junction to the distal basilar artery. There has been significant improvement in the caliber of the basilar artery proximally and the left vertebrobasilar junction. However, at the distal portion of the stented segment there was severe stenosis with flow noted into the distal basilar artery and the superior cerebellar arteries. The left common carotid arteriogram demonstrates mild narrowing at the origin of the left external carotid artery. Its branches opacify normally. The left internal carotid artery at the bulb and just proximally has a smooth atherosclerotic plaque which extends into the bulb itself along the posterolateral wall. There is an associated stenosis of approximately 20% by the NASCET criteria. More distally, the  left internal carotid artery is seen to opacify to the cranial skull base with a focal stenosis of approximately 50% at the cervical petrous junction on the lateral projection. Distal to this wide patency is seen of the petrous, cavernous and supraclinoid segments. The left middle cerebral artery and the left anterior cerebral  artery opacify normally into the capillary and venous phases. The right common carotid arteriogram demonstrates wide patency at the origin of the right external carotid artery. Its branches are normally opacified. The right internal carotid artery at the bulb to the cranial skull base opacifies widely. The petrous, cavernous and supraclinoid segments are widely patent. The right middle cerebral artery and the right anterior cerebral artery opacify into the capillary and venous phases. Also noted is partial retrograde opacification via the diminutive right posterior communicating artery of the distal basilar artery and the right posterior cerebral artery. IMPRESSION: Severe stenosis within the previously positioned stent in the vertebrobasilar junction extending into the basilar artery. Interval significant improvement of the 2 tandem lesions in the more proximal basilar artery and the left vertebrobasilar junction. 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 Occluded right vertebrobasilar junction just distal to the right posterior-inferior cerebellar artery. PLAN: Findings reviewed with patient, the spouse, and the referring neurologist. Options of continued medical therapy with dual antiplatelets, or consideration of a balloon angioplasty of the severe stenosis of the distal portion of the stented segment of the basilar artery discussed. It was decided to pursue with balloon angioplasty of the distal intra stent severe stenosis of the basilar artery given the patient's failure on the current dual anti-platelet regimen. Electronically Signed   By: Luanne Bras M.D.   On: 02/14/2018 10:58   Ir Angio Vertebral Sel Vertebral Uni R Mod Sed  Result Date: 02/15/2018 CLINICAL DATA:  Previous history of severe bilateral vertebrobasilar disease. Status post assisted angioplasty of the left vertebrobasilar junction and the proximal basilar artery. Patient with new symptoms  of transient incoordination and abnormal MRI of the brain. EXAM: IR ANGIO VERTEBRAL SEL SUBCLAVIAN INNOMINATE UNI LEFT MOD SED; BILATERAL COMMON CAROTID AND INNOMINATE ANGIOGRAPHY; IR ANGIO VERTEBRAL SEL VERTEBRAL UNI RIGHT MOD SED COMPARISON:  MRI MRA of the brain of 01/06/2018, and CT angiogram of 02/08/2018, and MRI of the brain of 02/09/2018. MEDICATIONS: Heparin 2000 units IV; no antibiotic was administered within 1 hour of the procedure. ANESTHESIA/SEDATION: Versed 1 mg IV; Fentanyl 25 mcg IV Moderate Sedation Time:  39 minutes The patient was continuously monitored during the procedure by the interventional radiology nurse under my direct supervision. CONTRAST:  Isovue 300 approximately 60 mL FLUOROSCOPY TIME:  Fluoroscopy Time: 12 minutes 54 seconds (857 mGy). COMPLICATIONS: None immediate. TECHNIQUE: Informed written consent was obtained from the patient after a thorough discussion of the procedural risks, benefits and alternatives. All questions were addressed. Maximal Sterile Barrier Technique was utilized including caps, mask, sterile gowns, sterile gloves, sterile drape, hand hygiene and skin antiseptic. A timeout was performed prior to the initiation of the procedure. The right forearm to the wrist was prepped and draped in the usual sterile fashion. The right radial artery was then identified under ultrasound and accessed following the application of 893 mcg of nitroglycerin mixed with 1% lidocaine to the overlying skin surface. Thereafter, access was obtained using a pediatric needle into the radial artery. The 0.018 inch wire was then advanced without any difficulty followed by the advancement of a 4/5 French radial sheath. Free aspiration of blood was  noted at the obturator of the sheath. 2.5 mg of verapamil and 100 mcg of nitroglycerin mixed with blood were then slowly injected through the obturator. An arteriogram was then performed through the obturator which demonstrated wide patency of the  radial artery. Over a 0.035 inch Roadrunner guidewire, a 5 [place] Simmons 2 diagnostic catheter was then advanced without difficulty to the subclavian artery. The guidewire was removed. An arteriogram was then performed just proximal to the right vertebral artery. The diagnostic catheter was then advanced into the descending thoracic aorta where the [facility] catheter was then formed without difficulty. Selective cannulation was then performed of the left subclavian artery, the left common carotid artery, the right common carotid artery. Also performed were selective cannulation and arteriogram of the right vertebral artery. Following the procedure, the radial sheath was removed with the successful application of a wrist band. Distal radial pulse was present following the application of the wrist band. FINDINGS: The right vertebral artery demonstrates mild stenosis at its origin. There is opacification of the right vertebral artery to the vertebrobasilar junction with complete angiographic occlusion noted just distal to the origin of the right posterior-inferior cerebellar artery. The left subclavian arteriogram demonstrates mild stenosis at the origin of the left vertebral artery. This is the dominant vertebral artery which is seen to opacify to the cranial skull base. Patency is seen of the left vertebrobasilar junction though with approximately 50% stenosis proximal to the left posterior-inferior cerebellar artery. Distal to this, the previously positioned stent is noted extending from the left vertebrobasilar junction to the distal basilar artery. There has been significant improvement in the caliber of the basilar artery proximally and the left vertebrobasilar junction. However, at the distal portion of the stented segment there was severe stenosis with flow noted into the distal basilar artery and the superior cerebellar arteries. The left common carotid arteriogram demonstrates mild narrowing at the origin of  the left external carotid artery. Its branches opacify normally. The left internal carotid artery at the bulb and just proximally has a smooth atherosclerotic plaque which extends into the bulb itself along the posterolateral wall. There is an associated stenosis of approximately 20% by the NASCET criteria. More distally, the left internal carotid artery is seen to opacify to the cranial skull base with a focal stenosis of approximately 50% at the cervical petrous junction on the lateral projection. Distal to this wide patency is seen of the petrous, cavernous and supraclinoid segments. The left middle cerebral artery and the left anterior cerebral artery opacify normally into the capillary and venous phases. The right common carotid arteriogram demonstrates wide patency at the origin of the right external carotid artery. Its branches are normally opacified. The right internal carotid artery at the bulb to the cranial skull base opacifies widely. The petrous, cavernous and supraclinoid segments are widely patent. The right middle cerebral artery and the right anterior cerebral artery opacify into the capillary and venous phases. Also noted is partial retrograde opacification via the diminutive right posterior communicating artery of the distal basilar artery and the right posterior cerebral artery. IMPRESSION: Severe stenosis within the previously positioned stent in the vertebrobasilar junction extending into the basilar artery. Interval significant improvement of the 2 tandem lesions in the more proximal basilar artery and the left vertebrobasilar junction. 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 Occluded right vertebrobasilar junction just distal to the right posterior-inferior cerebellar artery. PLAN: Findings reviewed with patient, the spouse, and the referring neurologist.  Options of continued medical therapy with dual antiplatelets, or consideration of a balloon  angioplasty of the severe stenosis of the distal portion of the stented segment of the basilar artery discussed. It was decided to pursue with balloon angioplasty of the distal intra stent severe stenosis of the basilar artery given the patient's failure on the current dual anti-platelet regimen. Electronically Signed   By: Luanne Bras M.D.   On: 02/14/2018 10:58    Labs:  CBC: Recent Labs    01/07/18 1148 02/08/18 2104 02/08/18 2107 02/10/18 0555 02/12/18 0355  WBC 13.1* 9.9  --  8.8 9.8  HGB 13.6 13.6 14.3 13.1 13.1  HCT 42.6 42.9 42.0 42.4 41.7  PLT 325 352  --  323 323    COAGS: Recent Labs    11/30/17 1928 01/05/18 1654 02/08/18 2104 02/25/18 0629  INR 1.02 0.93 0.98 0.91  APTT 35 28 34  --     BMP: Recent Labs    01/07/18 1148 02/08/18 2104 02/08/18 2107 02/10/18 0555 02/12/18 0355  NA 137 139 140 139 138  K 4.2 3.8 3.9 4.1 3.8  CL 105 104 105 104 102  CO2 25 24  --  25 25  GLUCOSE 199* 114* 109* 142* 119*  BUN _0 CALCIUM 9.5 9.6  --  9.2 9.3  CREATININE 1.23 1.21 1.30* 1.13 1.24  GFRNONAA >60 >60  --  >60 >60  GFRAA >60 >60  --  >60 >60    LIVER FUNCTION TESTS: Recent Labs    01/05/18 1654 01/06/18 0556 02/08/18 2104  BILITOT 0.6 0.5 0.4  AST _1 ALT _2 ALKPHOS 60 59 63  PROT 7.6 6.8 8.6*  ALBUMIN 4.3 3.8 4.5    TUMOR MARKERS: No results for input(s): AFPTM, CEA, CA199, CHROMGRNA in the last 8760 hours.  Assessment and Plan:  Basilar artery stenosis. Plan for image-guided diagnostic cerebral angiogram with possible angioplasty/stent placement today with Dr. [name]. Patient is NPO. Afebrile. INR 0.91 seconds this AM. P2Y12 150 PRU this AM.  Risks and benefits of  angiogram with intervention were discussed with the patient including, but not limited to bleeding, infection, vascular injury, contrast induced renal failure, stroke or even death. This interventional procedure involves the use of X-rays  and because of the nature of the planned procedure, it is possible that we will have prolonged use of X-ray fluoroscopy. Potential radiation risks to you include (but are not limited to) the following: - A slightly elevated risk for cancer  several years later in life. This risk is typically less than 0.5% percent. This risk is low in comparison to the normal incidence of human cancer, which is 33% for women and 50% for men according to the [employer]. - Radiation induced injury can include skin redness, resembling a rash, tissue breakdown / ulcers and hair loss (which can be temporary or permanent).  The likelihood of either of these occurring depends on the difficulty of the procedure and whether you are sensitive to radiation due to previous procedures, disease, or genetic conditions.  IF your procedure requires a prolonged use of radiation, you will be notified and given written instructions for further action.  It is your responsibility to monitor the irradiated area for the 2 weeks following the procedure and to notify your physician if you are concerned that you have suffered a radiation induced injury.   All of the patient's questions were answered, patient is  agreeable to proceed. Consent signed and in chart.   Thank you for this interesting consult.  I greatly enjoyed meeting [place] and look forward to participating in their care.  A copy of this report was sent to the requesting provider on this date.  Electronically Signed: Earley Abide, PA-C 02/25/2018, 7:22 AM   I spent a total of 40 Minutes in face to face in clinical consultation, greater than 50% of which was counseling/coordinating care for basilar artery stenosis.

## 2018-02-25 NOTE — Transfer of Care (Signed)
Immediate Anesthesia Transfer of Care Note  Patient: Devin [name]  Procedure(s) Performed: STENT PLACEMENT (N/A )  Patient Location: PACU  Anesthesia Type:General  Level of Consciousness: awake, alert  and oriented  Airway & Oxygen Therapy: Patient Spontanous Breathing and Patient connected to nasal cannula oxygen  Post-op Assessment: Report given to RN and Post -op Vital signs reviewed and stable  Post vital signs: Reviewed and stable  Last Vitals:  Vitals Value Taken Time  BP 129/82 02/25/2018 11:03 AM  Temp 36.5 C 02/25/2018 11:05 AM  Pulse 75 02/25/2018 11:09 AM  Resp 14 02/25/2018 11:09 AM  SpO2 98 % 02/25/2018 11:09 AM  Vitals shown include unvalidated device data.  Last Pain:  Vitals:   02/25/18 1105  TempSrc:   PainSc: 0-No pain         Complications: No apparent anesthesia complications

## 2018-02-25 NOTE — Sedation Documentation (Signed)
Sheath to right groin D/C, 6 fr. Exoseal applied.

## 2018-02-25 NOTE — Anesthesia Procedure Notes (Signed)
Arterial Line Insertion Start/End12/16/2019 7:43 AM, 02/25/2018 7:53 AM Performed by: Epifanio LeschesMercer, Octavian Godek L, CRNA, CRNA  Preanesthetic checklist: patient identified, IV checked, site marked, risks and benefits discussed, surgical consent, monitors and equipment checked, pre-op evaluation, timeout performed and anesthesia consent Lidocaine 1% used for infiltration Left, radial was placed Catheter size: 20 G Hand hygiene performed  and maximum sterile barriers used  Allen's test indicative of satisfactory collateral circulation Attempts: 1 Procedure performed without using ultrasound guided technique. Ultrasound Notes:anatomy identified Following insertion, dressing applied and Biopatch. Post procedure assessment: normal  Patient tolerated the procedure well with no immediate complications.

## 2018-02-25 NOTE — Procedures (Signed)
S/P Lt Vertebral angiogram followed by balloon angioplasty of of symptomatic  Distal basilar artery intrastent stenosis  To < 20 % residual stenosis

## 2018-02-25 NOTE — Anesthesia Procedure Notes (Signed)
Procedure Name: Intubation Date/Time: 02/25/2018 8:18 AM Performed by: Inda Coke, CRNA Pre-anesthesia Checklist: Patient identified, Emergency Drugs available, Suction available and Patient being monitored Patient Re-evaluated:Patient Re-evaluated prior to induction Oxygen Delivery Method: Circle System Utilized Preoxygenation: Pre-oxygenation with 100% oxygen Induction Type: IV induction Ventilation: Mask ventilation without difficulty Laryngoscope Size: Mac and 4 Grade View: Grade I Tube type: Oral Tube size: 7.5 mm Number of attempts: 1 Airway Equipment and Method: Stylet and Oral airway Placement Confirmation: ETT inserted through vocal cords under direct vision,  positive ETCO2 and breath sounds checked- equal and bilateral Secured at: 24 cm Tube secured with: Tape Dental Injury: Teeth and Oropharynx as per pre-operative assessment

## 2018-02-25 NOTE — Progress Notes (Signed)
ANTICOAGULATION CONSULT NOTE - Initial Consult  Pharmacy Consult for heparin Indication: Post interventional neurological procedure  Allergies  Allergen Reactions  . Crestor [Rosuvastatin] Other (See Comments)    rhabdomyolosis  . Bee Venom Swelling    SWELLING REACTION UNSPECIFIED     Patient Measurements: Actual Body Weight: 122 kg  Ideal Body Weight: 91 kg  Heparin Dosing Weight: 115 kg   Vital Signs: Temp: 97.5 F (36.4 C) (12/16 1205) Temp Source: Oral (12/16 0543) BP: 126/88 (12/16 1335) Pulse Rate: 60 (12/16 1335)  Labs: Recent Labs    02/25/18 0629  LABPROT 12.2  INR 0.91    CrCl cannot be calculated (Unknown ideal weight.).   Medical History: Past Medical History:  Diagnosis Date  . Asthma   . Chest pain   . Coronary artery disease    a. s/p DES x2 to LAD and DES to OM in 11/2017  . Depression   . Dyspnea   . GERD (gastroesophageal reflux disease)   . Headache   . History of kidney stones   . History of rhabdomyolysis   . Hyperlipidemia   . Hypertension   . Hypothyroidism   . Ischemic cardiomyopathy    a. 11/2017: echo showing EF of 35-40%, diffuse HK, and Grade 2 DD  . Kidney calculus 2014  . Pericardial effusion    Small, by dobutamine echocardiogram, 04/2006  . PONV (postoperative nausea and vomiting)   . Sleep apnea    does not wear cpap  . Stroke ([place])   . Tobacco abuse   . Type 2 diabetes mellitus without complications ([place]) 7/51/0258  . Vitamin D deficiency     Medications:  Medications Prior to Admission  Medication Sig Dispense Refill Last Dose  . aspirin 81 MG EC tablet Take 1 tablet (81 mg total) by mouth daily. With food 30 tablet 0 02/24/2018 at Unknown time  . citalopram (CELEXA) 10 MG tablet Take 1 tablet (10 mg total) by mouth daily. 30 tablet 3 02/24/2018 at 0330  . ezetimibe (ZETIA) 10 MG tablet Take 1 tablet (10 mg total) by mouth daily. 30 tablet 3 02/25/2018 at 0330  . fenofibrate 160 MG tablet Take 1 tablet (160 mg  total) by mouth daily. 30 tablet 3 02/25/2018 at 0330  . gabapentin (NEURONTIN) 300 MG capsule Take 1 capsule (300 mg total) by mouth 2 (two) times daily. 60 capsule 4 02/25/2018 at 0330  . glimepiride (AMARYL) 2 MG tablet Take 1 tablet (2 mg total) by mouth daily with breakfast. 30 tablet 0 02/24/2018 at Unknown time  . levothyroxine (SYNTHROID, LEVOTHROID) 175 MCG tablet Take 175 mcg by mouth daily before breakfast.   02/25/2018 at 0330  . losartan (COZAAR) 25 MG tablet Take 0.5 tablets (12.5 mg total) by mouth daily. 30 tablet 2 02/24/2018 at Unknown time  . pantoprazole (PROTONIX) 40 MG tablet Take 40 mg by mouth every morning.   02/25/2018 at 0330  . sitaGLIPtin-metformin (JANUMET) 50-1000 MG tablet Take 1 tablet by mouth 2 (two) times daily with a meal.   02/24/2018 at Unknown time  . ticagrelor (BRILINTA) 90 MG TABS tablet Take 1 tablet (90 mg total) by mouth 2 (two) times daily. 60 tablet 3 02/25/2018 at 0330  . Blood Glucose Monitoring Suppl w/Device KIT Dispense based on patient and insurance preference. Check blood sugar daily before breakfast (ICD9 250.0) 1 each 0 02/08/2018 at Unknown time  . nitroGLYCERIN (NITROSTAT) 0.4 MG SL tablet Place 1 tablet (0.4 mg total) under the tongue every  5 (five) minutes as needed for chest pain. 25 tablet 3 More than a month at Unknown time    Assessment: 19 YOM who presented as code stroke and underwent a cerebral angiogram to start IV heparin. He was not on any anticoagulants prior to admission. H/H and Plt on admission were wnl. He was started on IV heparin at 500 units/hr s/p IR.   Goal of Therapy:  Heparin level 0.1-0.25 units/ml Monitor platelets by anticoagulation protocol: Yes   Plan:  -Increase IV heparin to 700 units/hr -F/u 6 hr HL -IV heparin to stop at 0800 on 12/17 for sheath removal   Albertina Parr, PharmD., BCPS Clinical Pharmacist Clinical phone for 02/25/18 until 3:30pm: [phone] If after 3:30pm, please refer to [facility] for  unit-specific pharmacist

## 2018-02-25 NOTE — Patient Outreach (Signed)
[facility]) Care Management  02/25/2018  Devin DailyCarl L [name] [birth year] [id number]   Care coordination/RED on EMMI ALERT  RED ON EMMI ALERT Day # 6 Date: Wednesday 02/20/18 1610  Red Alert Reason: Smoked or been around smoke? yes   [facility] RN CM has made call attempts x 2 (02/21/18 & 02/22/18)  to engage [name] for EMMI red alert follow up.  [facility] for [facility] red alert at this time.   [facility] received Epic [facility] alert that [name] 02/25/18 for possible image guided diagnostic cerebral angiogram with possible angioplasty/stent placement. With review CM [facility]   [facility] CM will reschedule the 02/26/18 appointment for another day within the next 4 business days for a third and final call attempt for EMMI alert and also assess possible needs after this noted admission  Gregorio Worley L. Noelle PennerGibbs, RN, BSN, CCM [facility] Telephonic Care Management Care Coordinator Office number [phone] Mobile number [phone]  Main [facility] number [phone] Fax number [phone]

## 2018-02-26 ENCOUNTER — Ambulatory Visit: Payer: Self-pay | Admitting: *Deleted

## 2018-02-26 ENCOUNTER — Encounter (HOSPITAL_COMMUNITY): Payer: Self-pay | Admitting: Interventional Radiology

## 2018-02-26 LAB — CBC WITH DIFFERENTIAL/PLATELET
Abs Immature Granulocytes: 0.04 10*3/uL (ref 0.00–0.07)
Basophils Absolute: 0 10*3/uL (ref 0.0–0.1)
Basophils Relative: 0 %
Eosinophils Absolute: 0.1 10*3/uL (ref 0.0–0.5)
Eosinophils Relative: 1 %
HCT: 37.3 % — ABNORMAL LOW (ref 39.0–52.0)
Hemoglobin: 11.7 g/dL — ABNORMAL LOW (ref 13.0–17.0)
Immature Granulocytes: 0 %
Lymphocytes Relative: 35 %
Lymphs Abs: 4.5 10*3/uL — ABNORMAL HIGH (ref 0.7–4.0)
MCH: 28.1 pg (ref 26.0–34.0)
MCHC: 31.4 g/dL (ref 30.0–36.0)
MCV: 89.7 fL (ref 80.0–100.0)
MONO ABS: 0.8 10*3/uL (ref 0.1–1.0)
Monocytes Relative: 6 %
NEUTROS ABS: 7.4 10*3/uL (ref 1.7–7.7)
Neutrophils Relative %: 58 %
PLATELETS: 300 10*3/uL (ref 150–400)
RBC: 4.16 MIL/uL — ABNORMAL LOW (ref 4.22–5.81)
RDW: 14.1 % (ref 11.5–15.5)
WBC: 12.8 10*3/uL — ABNORMAL HIGH (ref 4.0–10.5)
nRBC: 0 % (ref 0.0–0.2)

## 2018-02-26 LAB — BASIC METABOLIC PANEL
ANION GAP: 10 (ref 5–15)
BUN: 14 mg/dL (ref 6–20)
CO2: 23 mmol/L (ref 22–32)
Calcium: 8.9 mg/dL (ref 8.9–10.3)
Chloride: 106 mmol/L (ref 98–111)
Creatinine, Ser: 1 mg/dL (ref 0.61–1.24)
GFR calc Af Amer: >60 mL/min (ref >60)
GFR calc non Af Amer: >60 mL/min (ref >60)
GLUCOSE: 141 mg/dL — AB (ref 70–99)
POTASSIUM: 3.8 mmol/L (ref 3.5–5.1)
Sodium: 139 mmol/L (ref 135–145)

## 2018-02-26 LAB — GLUCOSE, CAPILLARY: Glucose-Capillary: 128 mg/dL — ABNORMAL HIGH (ref 70–99)

## 2018-02-26 NOTE — Progress Notes (Signed)
Met with pt to discuss PCP follow up.  Pt currently active with [facility], and wishes to continue follow up there.  Advised pt to make PCP follow up appointment for 1-2 weeks post dc date.  He verbalizes understanding.  Reinaldo Raddle, RN, BSN  Trauma/Neuro ICU Case Manager [phone]

## 2018-02-26 NOTE — Discharge Summary (Signed)
Patient ID: Devin [name] MRN: [id number] DOB/AGE: [birth year] 47 y.o.  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 [name]. Procedure occurred without major complications and patient was transferred to neuro ICU in stable condition, VSS, right groin incision stable, for overnight observation. No major events occurred overnight.  Patient awake and alert laying in bed with no complaints at this time. Right groin incision stable. Plan to discharge home today and follow-up with Dr. [name] in clinic 2 weeks after discharge.   Consults: None  Significant Diagnostic Studies: Ct Angio Head W Or Wo Contrast  Result Date: 02/08/2018 CLINICAL DATA:  Initial evaluation for acute right facial numbness. EXAM: CT ANGIOGRAPHY HEAD AND NECK CT PERFUSION BRAIN TECHNIQUE: Multidetector CT imaging of the head and neck was performed using the standard protocol during bolus administration of intravenous contrast. Multiplanar CT image reconstructions and MIPs were obtained to evaluate the vascular anatomy. Carotid stenosis measurements (when applicable) are obtained utilizing NASCET criteria, using the distal internal carotid diameter as the denominator. Multiphase CT imaging of the brain was performed following IV bolus contrast injection. Subsequent parametric perfusion maps were calculated using RAPID software. CONTRAST:  181m ISOVUE-370 IOPAMIDOL (ISOVUE-370) INJECTION 76% COMPARISON:  Prior CT from earlier the same day. Comparison also made with prior  CTA from 11/30/2017 and arteriogram from 12/03/2017 FINDINGS: CTA NECK FINDINGS Aortic arch: Visualized aortic arch of normal caliber with normal 3 vessel morphology. No flow-limiting stenosis about the origin of the great vessels. Visualized subclavian arteries widely patent. Right carotid system: Right common carotid artery patent from its origin to the bifurcation without hemodynamically significant stenosis. Concentric mixed plaque about the right bifurcation with associated mild stenosis of up to 35% by NASCET criteria. Right ICA otherwise widely patent to the skull base without stenosis, dissection, or occlusion. Left carotid system: Left common carotid artery patent from its origin to the bifurcation without stenosis. Mixed but predominantly soft plaque at the left bifurcation/proximal left ICA with associated stenosis of up to 60% by NASCET criteria (series 8, image 148). Left ICA widely patent distally to the skull base without additional stenosis, dissection, or occlusion. Vertebral arteries: Both of the vertebral arteries arise from the subclavian arteries. Mild narrowing at the origin of the left vertebral artery. Vertebral arteries otherwise widely patent within the neck without stenosis, dissection, or occlusion. Skeleton: No acute osseous abnormality. No discrete lytic or blastic osseous lesions. 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 Poor dentition with prominent dental carie at the left first mandibular molar. Other neck: No other acute soft tissue abnormality within the neck. Upper chest: Visualized upper chest demonstrates no acute finding. Partially visualized lungs are grossly clear. Mild upper lobe predominant paraseptal emphysema. Review of the MIP images confirms the above findings CTA HEAD FINDINGS Anterior circulation: Internal carotid arteries widely patent to the termini without hemodynamically significant stenosis. ICA termini well perfused and symmetric. A1 segments  widely patent bilaterally. Normal anterior communicating artery. Anterior cerebral arteries widely patent to their distal aspects without stenosis. No M1 stenosis or occlusion. Normal MCA bifurcations. Distal MCA branches well perfused and symmetric. Posterior circulation: Vertebral arteries are co dominant and  widely patent as they course into the cranial vault. Severe near occlusive stenosis at the distal left V4 segment just prior to the vertebrobasilar junction (series 7, image 151). Right PICA patent proximally. Moderate multifocal atheromatous irregularity within the proximal left V4 segment with up to moderate approximate 50% stenosis. Vascular stent in place distally within the left V4 segment, extending distally to the proximal-mid basilar artery (series 7, image 149-proximal aspect, series 7 image 128-distal aspect). Possible stenoses at the stent margins difficult to evaluate given adjacent stent markers. There is wide patency of the stent through the distal left vertebral artery and proximal basilar artery, markedly improved relative to prior CTA. Attenuated but patent flow within the proximal-mid basilar artery distal to the takeoff of the anterior inferior cerebellar arteries, with approximate moderate diffuse narrowing (series 8, image 123 coronal, series 7, image 133 axial). Basilar moderately narrowed just distal to the stent by approximately 50-60%, likely similar to previous CTA (series 7, image 124). Superior cerebellar arteries irregular but patent at their origins. Both of the PCAs appear to be primarily supplied via the basilar. Extensive atheromatous irregularity with short-segment moderate to severe proximal P1 stenoses (series 8, image 115 on the left, image 116 on the right). PCAs are patent distally but attenuated with extensive atheromatous irregularity. Venous sinuses: Patent. Anatomic variants: None significant. Delayed phase: No pathologic enhancement. Chronic left cerebellar infarcts  noted. Review of the MIP images confirms the above findings CT Brain Perfusion Findings: CBF (<30%) Volume: 82m Perfusion (Tmax>6.0s) volume: 159mMismatch Volume: 1166mnfarction Location:Negative CT perfusion for core infarct. Patchy perfusion deficit at the mid cerebellar region, likely related to right V4 stenosis and possibly delayed flow through the stent. IMPRESSION: 1. Negative CTA for large vessel occlusion. 2. No evidence for core infarct by CT perfusion. Mild perfusion deficit within the posterior fossa likely related to bilateral V4 stenoses. 3. 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 Widely patent flow through the proximal and mid aspect of the stent. Attenuated flow with up to approximate 50% narrowing through the distal aspect of the stent, distal to the anterior inferior cerebral arteries. Basilar artery is patent distally, with additional moderate to advanced atheromatous irregularity involving the SCAs and PCAs bilaterally. 4. 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 5. Mild 35% atheromatous stenosis at the origin of the right ICA. 6. Widely patent anterior circulation within the head. Critical Value/emergent results were called by telephone at the time of interpretation on 02/08/2018 at 11:00 pm to Dr. MICGerlene Feewho verbally acknowledged these results. Electronically Signed   By: BenJeannine BogaD.   On: 02/08/2018 23:33   Ct Angio Neck W And/or Wo Contrast  Result Date: 02/08/2018 CLINICAL DATA:  Initial evaluation for acute right facial numbness. EXAM: CT ANGIOGRAPHY HEAD AND NECK CT PERFUSION BRAIN TECHNIQUE: Multidetector CT imaging of the head and neck was performed using the standard protocol during bolus administration of intravenous contrast. Multiplanar CT image reconstructions and MIPs were obtained to evaluate the vascular anatomy. Carotid stenosis measurements (when applicable) are obtained  utilizing NASCET criteria, using the distal internal carotid diameter as the denominator. Multiphase CT imaging of the brain was performed following IV bolus contrast injection. Subsequent parametric perfusion maps were calculated using RAPID software. CONTRAST:  125m54mOVUE-370 IOPAMIDOL (ISOVUE-370) INJECTION 76% COMPARISON:  Prior CT from earlier the same day. Comparison also made with prior CTA from 11/30/2017 and arteriogram from 12/03/2017 FINDINGS: CTA  NECK FINDINGS Aortic arch: Visualized aortic arch of normal caliber with normal 3 vessel morphology. No flow-limiting stenosis about the origin of the great vessels. Visualized subclavian arteries widely patent. Right carotid system: Right common carotid artery patent from its origin to the bifurcation without hemodynamically significant stenosis. Concentric mixed plaque about the right bifurcation with associated mild stenosis of up to 35% by NASCET criteria. Right ICA otherwise widely patent to the skull base without stenosis, dissection, or occlusion. Left carotid system: Left common carotid artery patent from its origin to the bifurcation without stenosis. Mixed but predominantly soft plaque at the left bifurcation/proximal left ICA with associated stenosis of up to 60% by NASCET criteria (series 8, image 148). Left ICA widely patent distally to the skull base without additional stenosis, dissection, or occlusion. Vertebral arteries: Both of the vertebral arteries arise from the subclavian arteries. Mild narrowing at the origin of the left vertebral artery. Vertebral arteries otherwise widely patent within the neck without stenosis, dissection, or occlusion. Skeleton: No acute osseous abnormality. No discrete lytic or blastic osseous lesions. 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 Poor dentition with prominent dental carie at the left first mandibular molar. Other neck: No other acute soft tissue abnormality within the neck. Upper chest:  Visualized upper chest demonstrates no acute finding. Partially visualized lungs are grossly clear. Mild upper lobe predominant paraseptal emphysema. Review of the MIP images confirms the above findings CTA HEAD FINDINGS Anterior circulation: Internal carotid arteries widely patent to the termini without hemodynamically significant stenosis. ICA termini well perfused and symmetric. A1 segments widely patent bilaterally. Normal anterior communicating artery. Anterior cerebral arteries widely patent to their distal aspects without stenosis. No M1 stenosis or occlusion. Normal MCA bifurcations. Distal MCA branches well perfused and symmetric. Posterior circulation: Vertebral arteries are co dominant and widely patent as they course into the cranial vault. Severe near occlusive stenosis at the distal left V4 segment just prior to the vertebrobasilar junction (series 7, image 151). Right PICA patent proximally. Moderate multifocal atheromatous irregularity within the proximal left V4 segment with up to moderate approximate 50% stenosis. Vascular stent in place distally within the left V4 segment, extending distally to the proximal-mid basilar artery (series 7, image 149-proximal aspect, series 7 image 128-distal aspect). Possible stenoses at the stent margins difficult to evaluate given adjacent stent markers. There is wide patency of the stent through the distal left vertebral artery and proximal basilar artery, markedly improved relative to prior CTA. Attenuated but patent flow within the proximal-mid basilar artery distal to the takeoff of the anterior inferior cerebellar arteries, with approximate moderate diffuse narrowing (series 8, image 123 coronal, series 7, image 133 axial). Basilar moderately narrowed just distal to the stent by approximately 50-60%, likely similar to previous CTA (series 7, image 124). Superior cerebellar arteries irregular but patent at their origins. Both of the PCAs appear to be primarily  supplied via the basilar. Extensive atheromatous irregularity with short-segment moderate to severe proximal P1 stenoses (series 8, image 115 on the left, image 116 on the right). PCAs are patent distally but attenuated with extensive atheromatous irregularity. Venous sinuses: Patent. Anatomic variants: None significant. Delayed phase: No pathologic enhancement. Chronic left cerebellar infarcts noted. Review of the MIP images confirms the above findings CT Brain Perfusion Findings: CBF (<30%) Volume: 79m Perfusion (Tmax>6.0s) volume: 166mMismatch Volume: 1178mnfarction Location:Negative CT perfusion for core infarct. Patchy perfusion deficit at the mid cerebellar region, likely related to right V4 stenosis and possibly delayed flow through the  stent. IMPRESSION: 1. Negative CTA for large vessel occlusion. 2. No evidence for core infarct by CT perfusion. Mild perfusion deficit within the posterior fossa likely related to bilateral V4 stenoses. 3. 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 Widely patent flow through the proximal and mid aspect of the stent. Attenuated flow with up to approximate 50% narrowing through the distal aspect of the stent, distal to the anterior inferior cerebral arteries. Basilar artery is patent distally, with additional moderate to advanced atheromatous irregularity involving the SCAs and PCAs bilaterally. 4. 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 5. Mild 35% atheromatous stenosis at the origin of the right ICA. 6. Widely patent anterior circulation within the head. Critical Value/emergent results were called by telephone at the time of interpretation on 02/08/2018 at 11:00 pm to Dr. Gerlene Fee , who verbally acknowledged these results. Electronically Signed   By: Jeannine Boga M.D.   On: 02/08/2018 23:33   Mr Brain Wo Contrast  Result Date: 02/09/2018 CLINICAL DATA:  Initial evaluation for acute  headache with right-sided facial numbness. EXAM: MRI HEAD WITHOUT CONTRAST TECHNIQUE: Multiplanar, multiecho pulse sequences of the brain and surrounding structures were obtained without intravenous contrast. COMPARISON:  Prior CT and CTA from 02/08/2018 FINDINGS: Brain: Generalized age-related cerebral atrophy. Mild chronic microvascular ischemic disease noted involving the pons. Chronic left cerebellar infarct. Single 5 mm focus of diffusion abnormality seen within the right paramedian pons (series 5, image 66), suspicious for a small acute to early subacute ischemic infarct. No associated hemorrhage. No other evidence for acute or subacute ischemia. Gray-white matter differentiation otherwise maintained. No other areas of chronic cortical infarction. No acute intracranial hemorrhage. Small amount of chronic hemosiderin staining noted about the chronic left cerebellar infarct. No mass lesion, midline shift or mass effect. No hydrocephalus. No extra-axial fluid collection. Pituitary gland within normal limits. 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 Major intracranial vascular flow voids otherwise maintained. Skull and upper cervical spine: Craniocervical junction within normal limits. Bone marrow signal intensity normal. No scalp soft tissue abnormality. Sinuses/Orbits: Globes and orbital soft tissues within normal limits. Scattered mucosal thickening and opacity within the ethmoidal air cells. Paranasal sinuses are otherwise clear. No significant mastoid effusion. Inner ear structures grossly normal. Other: None. IMPRESSION: 1. Punctate 5 mm focus of diffusion abnormality involving the right paramedian pons, suspicious for a small acute to early subacute small vessel type infarct. No associated hemorrhage. 2. No other acute intracranial abnormality. 3. Small chronic left cerebellar infarct.  Electronically Signed   By: Jeannine Boga M.D.   On: 02/09/2018 05:44   Ir US Guide Vasc Access Right  Result Date: 02/18/2018 CLINICAL DATA:  Previous history of severe bilateral vertebrobasilar disease. Status post assisted angioplasty of the left vertebrobasilar junction and the proximal basilar artery. Patient with new symptoms of transient incoordination and abnormal MRI of the brain.  EXAM: IR ANGIO VERTEBRAL SEL SUBCLAVIAN INNOMINATE UNI LEFT MOD SED; BILATERAL COMMON CAROTID AND INNOMINATE ANGIOGRAPHY; IR ANGIO VERTEBRAL SEL VERTEBRAL UNI RIGHT MOD SED  COMPARISON:  MRI MRA of the brain of 01/06/2018, and CT angiogram of 02/08/2018, and MRI of the brain of 02/09/2018.  MEDICATIONS: Heparin 2000 units IV; no antibiotic was administered within 1 hour of the procedure.  ANESTHESIA/SEDATION: Versed 1 mg IV; Fentanyl 25 mcg IV  Moderate Sedation Time:  39 minutes  The patient was continuously monitored during the procedure by the interventional radiology nurse under my direct supervision.  CONTRAST:  Isovue 300 approximately 60 mL  FLUOROSCOPY TIME:  Fluoroscopy Time: 12 minutes 54 seconds (857 mGy).  COMPLICATIONS: None immediate.  TECHNIQUE: Informed written consent was obtained from the patient after a thorough discussion of the procedural risks, benefits and alternatives. All questions were addressed. Maximal Sterile Barrier Technique was utilized including caps, mask, sterile gowns, sterile gloves, sterile drape, hand hygiene and skin antiseptic. A timeout was performed prior to the initiation of the procedure.  The right forearm to the wrist was prepped and draped in the usual sterile fashion. The right radial artery was then identified under ultrasound and accessed following the application of 109 mcg of nitroglycerin mixed with 1% lidocaine to the overlying skin surface.  Thereafter, access was obtained using a pediatric needle into the radial artery. The 0.018 inch wire was then  advanced without any difficulty followed by the advancement of a 4/5 French radial sheath.  Free aspiration of blood was noted at the obturator of the sheath. 2.5 mg of verapamil and 100 mcg of nitroglycerin mixed with blood were then slowly injected through the obturator.  An arteriogram was then performed through the obturator which demonstrated wide patency of the radial artery.  Over a 0.035 inch Roadrunner guidewire, a 5 [place] Simmons 2 diagnostic catheter was then advanced without difficulty to the subclavian artery. The guidewire was removed. An arteriogram was then performed just proximal to the right vertebral artery. The diagnostic catheter was then advanced into the descending thoracic aorta where the [facility] catheter was then formed without difficulty. Selective cannulation was then performed of the left subclavian artery, the left common carotid artery, the right common carotid artery. Also performed were selective cannulation and arteriogram of the right vertebral artery.  Following the procedure, the radial sheath was removed with the successful application of a wrist band. Distal radial pulse was present following the application of the wrist band.  FINDINGS: The right vertebral artery demonstrates mild stenosis at its origin.  There is opacification of the right vertebral artery to the vertebrobasilar junction with complete angiographic occlusion noted just distal to the origin of the right posterior-inferior cerebellar artery. The left subclavian arteriogram demonstrates mild stenosis at the origin of the left vertebral artery.  This is the dominant vertebral artery which is seen to opacify to the cranial skull base.  Patency is seen of the left vertebrobasilar junction though with approximately 50% stenosis proximal to the left posterior-inferior cerebellar artery.  Distal to this, the previously positioned stent is noted extending from the left vertebrobasilar junction to the distal  basilar artery.  There has been significant improvement in the caliber of the basilar artery proximally and the left vertebrobasilar junction. However, at the distal portion of the stented segment there was severe stenosis with flow noted into the distal basilar artery and the superior cerebellar arteries.  The left common carotid arteriogram demonstrates mild narrowing at the origin of the left external carotid artery. Its branches opacify normally.  The left internal carotid artery at the bulb and just proximally has a smooth atherosclerotic plaque which extends into the bulb itself along the posterolateral wall.  There is an associated stenosis of approximately 20% by the NASCET criteria.  More distally, the left internal carotid artery is seen to opacify to the cranial skull base with a focal stenosis of approximately 50% at the cervical petrous junction on the lateral projection.  Distal to this wide patency is seen of the petrous, cavernous and supraclinoid segments.  The left middle cerebral artery and the left anterior cerebral artery opacify normally into the capillary and venous phases.  The right common carotid arteriogram demonstrates wide patency at the origin of the right external carotid artery. Its branches are normally opacified.  The right internal carotid artery at the bulb to the cranial skull base opacifies widely. The petrous, cavernous and supraclinoid segments are widely patent. The right middle cerebral artery and the right anterior cerebral artery opacify into the capillary and venous phases.  Also noted is partial retrograde opacification via the diminutive right posterior communicating artery of the distal basilar artery and the right posterior cerebral artery.  IMPRESSION: Severe stenosis within the previously positioned stent in the vertebrobasilar junction extending into the basilar artery.  Interval significant improvement of the 2 tandem lesions in the more proximal  basilar artery and the left vertebrobasilar junction.  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  Occluded right vertebrobasilar junction just distal to the right posterior-inferior cerebellar artery.  PLAN: Findings reviewed with patient, the spouse, and the referring neurologist. Options of continued medical therapy with dual antiplatelets, or consideration of a balloon angioplasty of the severe stenosis of the distal portion of the stented segment of the basilar artery discussed. It was decided to pursue with balloon angioplasty of the distal intra stent severe stenosis of the basilar artery given the patient's failure on the current dual anti-platelet regimen.   Electronically Signed   By: Luanne Bras M.D.   On: 02/14/2018 10:58  Ct Cerebral Perfusion W Contrast  Result Date: 02/08/2018 CLINICAL DATA:  Initial evaluation for acute right facial numbness. EXAM: CT ANGIOGRAPHY HEAD AND NECK CT PERFUSION BRAIN TECHNIQUE: Multidetector CT imaging of the head and neck was performed using the standard protocol during bolus administration of intravenous contrast. Multiplanar CT image reconstructions and MIPs were obtained to evaluate the vascular anatomy. Carotid stenosis measurements (when applicable) are obtained utilizing NASCET criteria, using the distal internal carotid diameter as the denominator. Multiphase CT imaging of the brain was performed following IV bolus contrast injection. Subsequent parametric perfusion maps were calculated using RAPID software. CONTRAST:  110m ISOVUE-370 IOPAMIDOL (ISOVUE-370) INJECTION 76% COMPARISON:  Prior CT from earlier the same day. Comparison also made with prior CTA from 11/30/2017 and arteriogram from 12/03/2017 FINDINGS: CTA NECK FINDINGS Aortic arch: Visualized aortic arch of normal caliber with normal 3 vessel morphology. No flow-limiting stenosis about the origin of the great vessels. Visualized subclavian  arteries widely patent. Right carotid system: Right common carotid artery patent from its origin to the bifurcation without hemodynamically significant stenosis. Concentric mixed plaque about the right bifurcation with associated mild stenosis of up to 35% by NASCET criteria. Right ICA otherwise widely patent to the skull base without stenosis, dissection, or occlusion. Left carotid system: Left common carotid artery patent from its origin to the bifurcation without stenosis. Mixed but predominantly soft plaque at the left bifurcation/proximal left ICA with associated stenosis of up to 60% by NASCET criteria (series 8, image 148). Left ICA widely patent distally to the skull base without additional stenosis, dissection, or occlusion. Vertebral arteries: Both of the vertebral arteries arise from the subclavian arteries. Mild narrowing at the origin of the left vertebral artery. Vertebral arteries otherwise widely patent within the neck without stenosis, dissection, or occlusion. Skeleton: No acute osseous abnormality. No discrete lytic or blastic osseous lesions. 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 Poor dentition with prominent dental carie at the left first mandibular molar. Other neck: No other acute soft tissue abnormality within the neck. Upper chest: Visualized upper chest demonstrates no acute finding. Partially visualized lungs are grossly clear. Mild upper lobe predominant paraseptal emphysema. Review of the MIP images confirms the above findings CTA HEAD FINDINGS Anterior circulation: Internal carotid arteries widely patent to the termini without hemodynamically significant stenosis. ICA termini well perfused and symmetric. A1 segments widely patent bilaterally. Normal anterior communicating artery. Anterior cerebral arteries widely patent to their distal aspects without stenosis. No M1 stenosis or occlusion. Normal MCA bifurcations. Distal MCA branches well perfused and symmetric.  Posterior circulation: Vertebral arteries are co dominant and widely patent as they course into the cranial vault. Severe near occlusive stenosis at the distal left V4 segment just prior to the vertebrobasilar junction (series 7, image 151). Right PICA patent proximally. Moderate multifocal atheromatous irregularity within the proximal left V4 segment with up to moderate approximate 50% stenosis. Vascular stent in place distally within the left V4 segment, extending distally to the proximal-mid basilar artery (series 7, image 149-proximal aspect, series 7 image 128-distal aspect). Possible stenoses at the stent margins difficult to evaluate given adjacent stent markers. There is wide patency of the stent through the distal left vertebral artery and proximal basilar artery, markedly improved relative to prior CTA. Attenuated but patent flow within the proximal-mid basilar artery distal to the takeoff of the anterior inferior cerebellar arteries, with approximate moderate diffuse narrowing (series 8, image 123 coronal, series 7, image 133 axial). Basilar moderately narrowed just distal to the stent by approximately 50-60%, likely similar to previous CTA (series 7, image 124). Superior cerebellar arteries irregular but patent at their origins. Both of the PCAs appear to be primarily supplied via the basilar. Extensive atheromatous irregularity with short-segment moderate to severe proximal P1 stenoses (series 8, image 115 on the left, image 116 on the right). PCAs are patent distally but attenuated with extensive atheromatous irregularity. Venous sinuses: Patent. Anatomic variants: None significant. Delayed phase: No pathologic enhancement. Chronic left cerebellar infarcts noted. Review of the MIP images confirms the above findings CT Brain Perfusion Findings: CBF (<30%) Volume: 29m Perfusion (Tmax>6.0s) volume: 146mMismatch Volume: 1136mnfarction Location:Negative CT perfusion for core infarct. Patchy perfusion  deficit at the mid cerebellar region, likely related to right V4 stenosis and possibly delayed flow through the stent. IMPRESSION: 1. Negative CTA for large vessel occlusion. 2. No evidence for core infarct by CT perfusion. Mild perfusion deficit within the posterior fossa likely related to bilateral V4 stenoses. 3. 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 Widely patent flow through the proximal and mid aspect of the stent. Attenuated flow with up to approximate 50% narrowing through the distal aspect of the stent, distal to the anterior inferior cerebral arteries. Basilar artery is patent distally, with additional moderate to advanced atheromatous irregularity involving the SCAs and PCAs bilaterally. 4. 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 5. Mild 35% atheromatous stenosis at the origin of the right ICA. 6. Widely patent anterior circulation within the head. Critical Value/emergent results were called by telephone at the time of interpretation on 02/08/2018 at 11:00 pm to Dr. MICGerlene Feewho verbally acknowledged these results. Electronically Signed   By: BenJeannine BogaD.   On: 02/08/2018 23:33   Ct Head Code Stroke Wo Contrast  Result Date: 02/08/2018 CLINICAL DATA:  Code stroke.  Right-sided facial numbness EXAM: CT HEAD WITHOUT CONTRAST TECHNIQUE: Contiguous axial images were obtained from the base of the skull through the vertex without intravenous contrast. COMPARISON:  None. FINDINGS: Brain: There is no mass, hemorrhage or extra-axial collection. The size and configuration of the ventricles and extra-axial CSF spaces are normal. There is hypoattenuation of the periventricular white matter, most commonly indicating chronic ischemic microangiopathy. Vascular: No abnormal hyperdensity of the major intracranial arteries or dural venous sinuses. No intracranial atherosclerosis. Skull: Right parietal scalp hematoma.  No  skull fracture. Sinuses/Orbits: No fluid levels or advanced mucosal thickening of the visualized paranasal sinuses. No mastoid or middle ear effusion. The orbits are normal. ASPECTS [facility] Stroke Program Early CT Score) - Ganglionic level infarction (caudate, lentiform nuclei, internal capsule, insula, M1-M3 cortex): 7 - Supraganglionic infarction (M4-M6 cortex): 3 Total score (0-10 with 10 being normal): 10 IMPRESSION: 1. No acute intracranial abnormality. 2. ASPECTS is 10. 3. Right parietal scalp hematoma without skull fracture. * These results were called by telephone at the time of interpretation on 02/08/2018 at 8:58 pm to Dr. Gerlene Fee , who verbally acknowledged these results. Electronically Signed   By: Ulyses Jarred M.D.   On: 02/08/2018 20:59   Ir Angio Intra Extracran Sel Com Carotid Innominate Bilat Mod Sed  Result Date: 02/15/2018 CLINICAL DATA:  Previous history of severe bilateral vertebrobasilar disease. Status post assisted angioplasty of the left vertebrobasilar junction and the proximal basilar artery. Patient with new symptoms of transient incoordination and abnormal MRI of the brain. EXAM: IR ANGIO VERTEBRAL SEL SUBCLAVIAN INNOMINATE UNI LEFT MOD SED; BILATERAL COMMON CAROTID AND INNOMINATE ANGIOGRAPHY; IR ANGIO VERTEBRAL SEL VERTEBRAL UNI RIGHT MOD SED COMPARISON:  MRI MRA of the brain of 01/06/2018, and CT angiogram of 02/08/2018, and MRI of the brain of 02/09/2018. MEDICATIONS: Heparin 2000 units IV; no antibiotic was administered within 1 hour of the procedure. ANESTHESIA/SEDATION: Versed 1 mg IV; Fentanyl 25 mcg IV Moderate Sedation Time:  39 minutes The patient was continuously monitored during the procedure by the interventional radiology nurse under my direct supervision. CONTRAST:  Isovue 300 approximately 60 mL FLUOROSCOPY TIME:  Fluoroscopy Time: 12 minutes 54 seconds (857 mGy). COMPLICATIONS: None immediate. TECHNIQUE: Informed written consent was obtained from the patient  after a thorough discussion of the procedural risks, benefits and alternatives. All questions were addressed. Maximal Sterile Barrier Technique was utilized including caps, mask, sterile gowns, sterile gloves, sterile drape, hand hygiene and skin antiseptic. A timeout was performed prior to the initiation of the procedure. The right forearm to the wrist was prepped and draped in the usual sterile fashion. The right radial artery was then identified under ultrasound and accessed following the application of 967 mcg of nitroglycerin mixed with 1% lidocaine to the overlying skin surface. Thereafter, access was obtained using a pediatric needle into the radial artery. The 0.018 inch wire was then advanced without any difficulty followed by the advancement of a 4/5 French radial sheath. Free aspiration of blood was noted at the obturator of the sheath. 2.5 mg of verapamil and 100 mcg of nitroglycerin mixed with blood were then slowly injected through the obturator. An arteriogram was then performed through the obturator which demonstrated wide patency of the radial artery. Over a 0.035 inch Roadrunner guidewire, a 5 [place] Simmons 2 diagnostic catheter was then advanced without difficulty to the subclavian artery. The guidewire was removed. An arteriogram was then performed just proximal to the right vertebral artery. The diagnostic catheter was  then advanced into the descending thoracic aorta where the [facility] catheter was then formed without difficulty. Selective cannulation was then performed of the left subclavian artery, the left common carotid artery, the right common carotid artery. Also performed were selective cannulation and arteriogram of the right vertebral artery. Following the procedure, the radial sheath was removed with the successful application of a wrist band. Distal radial pulse was present following the application of the wrist band. FINDINGS: The right vertebral artery demonstrates mild stenosis at  its origin. There is opacification of the right vertebral artery to the vertebrobasilar junction with complete angiographic occlusion noted just distal to the origin of the right posterior-inferior cerebellar artery. The left subclavian arteriogram demonstrates mild stenosis at the origin of the left vertebral artery. This is the dominant vertebral artery which is seen to opacify to the cranial skull base. Patency is seen of the left vertebrobasilar junction though with approximately 50% stenosis proximal to the left posterior-inferior cerebellar artery. Distal to this, the previously positioned stent is noted extending from the left vertebrobasilar junction to the distal basilar artery. There has been significant improvement in the caliber of the basilar artery proximally and the left vertebrobasilar junction. However, at the distal portion of the stented segment there was severe stenosis with flow noted into the distal basilar artery and the superior cerebellar arteries. The left common carotid arteriogram demonstrates mild narrowing at the origin of the left external carotid artery. Its branches opacify normally. The left internal carotid artery at the bulb and just proximally has a smooth atherosclerotic plaque which extends into the bulb itself along the posterolateral wall. There is an associated stenosis of approximately 20% by the NASCET criteria. More distally, the left internal carotid artery is seen to opacify to the cranial skull base with a focal stenosis of approximately 50% at the cervical petrous junction on the lateral projection. Distal to this wide patency is seen of the petrous, cavernous and supraclinoid segments. The left middle cerebral artery and the left anterior cerebral artery opacify normally into the capillary and venous phases. The right common carotid arteriogram demonstrates wide patency at the origin of the right external carotid artery. Its branches are normally opacified. The right  internal carotid artery at the bulb to the cranial skull base opacifies widely. The petrous, cavernous and supraclinoid segments are widely patent. The right middle cerebral artery and the right anterior cerebral artery opacify into the capillary and venous phases. Also noted is partial retrograde opacification via the diminutive right posterior communicating artery of the distal basilar artery and the right posterior cerebral artery. IMPRESSION: Severe stenosis within the previously positioned stent in the vertebrobasilar junction extending into the basilar artery. Interval significant improvement of the 2 tandem lesions in the more proximal basilar artery and the left vertebrobasilar junction. 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 Occluded right vertebrobasilar junction just distal to the right posterior-inferior cerebellar artery. PLAN: Findings reviewed with patient, the spouse, and the referring neurologist. Options of continued medical therapy with dual antiplatelets, or consideration of a balloon angioplasty of the severe stenosis of the distal portion of the stented segment of the basilar artery discussed. It was decided to pursue with balloon angioplasty of the distal intra stent severe stenosis of the basilar artery given the patient's failure on the current dual anti-platelet regimen. Electronically Signed   By: Luanne Bras M.D.   On: 02/14/2018 10:58   Ir Angio Vertebral Sel Subclavian Innominate Uni L  Mod Sed  Result Date: 02/15/2018 CLINICAL DATA:  Previous history of severe bilateral vertebrobasilar disease. Status post assisted angioplasty of the left vertebrobasilar junction and the proximal basilar artery. Patient with new symptoms of transient incoordination and abnormal MRI of the brain. EXAM: IR ANGIO VERTEBRAL SEL SUBCLAVIAN INNOMINATE UNI LEFT MOD SED; BILATERAL COMMON CAROTID AND INNOMINATE ANGIOGRAPHY; IR ANGIO VERTEBRAL SEL  VERTEBRAL UNI RIGHT MOD SED COMPARISON:  MRI MRA of the brain of 01/06/2018, and CT angiogram of 02/08/2018, and MRI of the brain of 02/09/2018. MEDICATIONS: Heparin 2000 units IV; no antibiotic was administered within 1 hour of the procedure. ANESTHESIA/SEDATION: Versed 1 mg IV; Fentanyl 25 mcg IV Moderate Sedation Time:  39 minutes The patient was continuously monitored during the procedure by the interventional radiology nurse under my direct supervision. CONTRAST:  Isovue 300 approximately 60 mL FLUOROSCOPY TIME:  Fluoroscopy Time: 12 minutes 54 seconds (857 mGy). COMPLICATIONS: None immediate. TECHNIQUE: Informed written consent was obtained from the patient after a thorough discussion of the procedural risks, benefits and alternatives. All questions were addressed. Maximal Sterile Barrier Technique was utilized including caps, mask, sterile gowns, sterile gloves, sterile drape, hand hygiene and skin antiseptic. A timeout was performed prior to the initiation of the procedure. The right forearm to the wrist was prepped and draped in the usual sterile fashion. The right radial artery was then identified under ultrasound and accessed following the application of 546 mcg of nitroglycerin mixed with 1% lidocaine to the overlying skin surface. Thereafter, access was obtained using a pediatric needle into the radial artery. The 0.018 inch wire was then advanced without any difficulty followed by the advancement of a 4/5 French radial sheath. Free aspiration of blood was noted at the obturator of the sheath. 2.5 mg of verapamil and 100 mcg of nitroglycerin mixed with blood were then slowly injected through the obturator. An arteriogram was then performed through the obturator which demonstrated wide patency of the radial artery. Over a 0.035 inch Roadrunner guidewire, a 5 [place] Simmons 2 diagnostic catheter was then advanced without difficulty to the subclavian artery. The guidewire was removed. An arteriogram was  then performed just proximal to the right vertebral artery. The diagnostic catheter was then advanced into the descending thoracic aorta where the [facility] catheter was then formed without difficulty. Selective cannulation was then performed of the left subclavian artery, the left common carotid artery, the right common carotid artery. Also performed were selective cannulation and arteriogram of the right vertebral artery. Following the procedure, the radial sheath was removed with the successful application of a wrist band. Distal radial pulse was present following the application of the wrist band. FINDINGS: The right vertebral artery demonstrates mild stenosis at its origin. There is opacification of the right vertebral artery to the vertebrobasilar junction with complete angiographic occlusion noted just distal to the origin of the right posterior-inferior cerebellar artery. The left subclavian arteriogram demonstrates mild stenosis at the origin of the left vertebral artery. This is the dominant vertebral artery which is seen to opacify to the cranial skull base. Patency is seen of the left vertebrobasilar junction though with approximately 50% stenosis proximal to the left posterior-inferior cerebellar artery. Distal to this, the previously positioned stent is noted extending from the left vertebrobasilar junction to the distal basilar artery. There has been significant improvement in the caliber of the basilar artery proximally and the left vertebrobasilar junction. However, at the distal portion of the stented segment there was severe stenosis with flow noted into  the distal basilar artery and the superior cerebellar arteries. The left common carotid arteriogram demonstrates mild narrowing at the origin of the left external carotid artery. Its branches opacify normally. The left internal carotid artery at the bulb and just proximally has a smooth atherosclerotic plaque which extends into the bulb itself along  the posterolateral wall. There is an associated stenosis of approximately 20% by the NASCET criteria. More distally, the left internal carotid artery is seen to opacify to the cranial skull base with a focal stenosis of approximately 50% at the cervical petrous junction on the lateral projection. Distal to this wide patency is seen of the petrous, cavernous and supraclinoid segments. The left middle cerebral artery and the left anterior cerebral artery opacify normally into the capillary and venous phases. The right common carotid arteriogram demonstrates wide patency at the origin of the right external carotid artery. Its branches are normally opacified. The right internal carotid artery at the bulb to the cranial skull base opacifies widely. The petrous, cavernous and supraclinoid segments are widely patent. The right middle cerebral artery and the right anterior cerebral artery opacify into the capillary and venous phases. Also noted is partial retrograde opacification via the diminutive right posterior communicating artery of the distal basilar artery and the right posterior cerebral artery. IMPRESSION: Severe stenosis within the previously positioned stent in the vertebrobasilar junction extending into the basilar artery. Interval significant improvement of the 2 tandem lesions in the more proximal basilar artery and the left vertebrobasilar junction. 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 Occluded right vertebrobasilar junction just distal to the right posterior-inferior cerebellar artery. PLAN: Findings reviewed with patient, the spouse, and the referring neurologist. Options of continued medical therapy with dual antiplatelets, or consideration of a balloon angioplasty of the severe stenosis of the distal portion of the stented segment of the basilar artery discussed. It was decided to pursue with balloon angioplasty of the distal intra stent severe stenosis of  the basilar artery given the patient's failure on the current dual anti-platelet regimen. Electronically Signed   By: Luanne Bras M.D.   On: 02/14/2018 10:58   Ir Angio Vertebral Sel Vertebral Uni R Mod Sed  Result Date: 02/15/2018 CLINICAL DATA:  Previous history of severe bilateral vertebrobasilar disease. Status post assisted angioplasty of the left vertebrobasilar junction and the proximal basilar artery. Patient with new symptoms of transient incoordination and abnormal MRI of the brain. EXAM: IR ANGIO VERTEBRAL SEL SUBCLAVIAN INNOMINATE UNI LEFT MOD SED; BILATERAL COMMON CAROTID AND INNOMINATE ANGIOGRAPHY; IR ANGIO VERTEBRAL SEL VERTEBRAL UNI RIGHT MOD SED COMPARISON:  MRI MRA of the brain of 01/06/2018, and CT angiogram of 02/08/2018, and MRI of the brain of 02/09/2018. MEDICATIONS: Heparin 2000 units IV; no antibiotic was administered within 1 hour of the procedure. ANESTHESIA/SEDATION: Versed 1 mg IV; Fentanyl 25 mcg IV Moderate Sedation Time:  39 minutes The patient was continuously monitored during the procedure by the interventional radiology nurse under my direct supervision. CONTRAST:  Isovue 300 approximately 60 mL FLUOROSCOPY TIME:  Fluoroscopy Time: 12 minutes 54 seconds (857 mGy). COMPLICATIONS: None immediate. TECHNIQUE: Informed written consent was obtained from the patient after a thorough discussion of the procedural risks, benefits and alternatives. All questions were addressed. Maximal Sterile Barrier Technique was utilized including caps, mask, sterile gowns, sterile gloves, sterile drape, hand hygiene and skin antiseptic. A timeout was performed prior to the initiation of the procedure. The right forearm to the wrist was prepped and  draped in the usual sterile fashion. The right radial artery was then identified under ultrasound and accessed following the application of 341 mcg of nitroglycerin mixed with 1% lidocaine to the overlying skin surface. Thereafter, access was obtained  using a pediatric needle into the radial artery. The 0.018 inch wire was then advanced without any difficulty followed by the advancement of a 4/5 French radial sheath. Free aspiration of blood was noted at the obturator of the sheath. 2.5 mg of verapamil and 100 mcg of nitroglycerin mixed with blood were then slowly injected through the obturator. An arteriogram was then performed through the obturator which demonstrated wide patency of the radial artery. Over a 0.035 inch Roadrunner guidewire, a 5 [place] Simmons 2 diagnostic catheter was then advanced without difficulty to the subclavian artery. The guidewire was removed. An arteriogram was then performed just proximal to the right vertebral artery. The diagnostic catheter was then advanced into the descending thoracic aorta where the [facility] catheter was then formed without difficulty. Selective cannulation was then performed of the left subclavian artery, the left common carotid artery, the right common carotid artery. Also performed were selective cannulation and arteriogram of the right vertebral artery. Following the procedure, the radial sheath was removed with the successful application of a wrist band. Distal radial pulse was present following the application of the wrist band. FINDINGS: The right vertebral artery demonstrates mild stenosis at its origin. There is opacification of the right vertebral artery to the vertebrobasilar junction with complete angiographic occlusion noted just distal to the origin of the right posterior-inferior cerebellar artery. The left subclavian arteriogram demonstrates mild stenosis at the origin of the left vertebral artery. This is the dominant vertebral artery which is seen to opacify to the cranial skull base. Patency is seen of the left vertebrobasilar junction though with approximately 50% stenosis proximal to the left posterior-inferior cerebellar artery. Distal to this, the previously positioned stent is noted  extending from the left vertebrobasilar junction to the distal basilar artery. There has been significant improvement in the caliber of the basilar artery proximally and the left vertebrobasilar junction. However, at the distal portion of the stented segment there was severe stenosis with flow noted into the distal basilar artery and the superior cerebellar arteries. The left common carotid arteriogram demonstrates mild narrowing at the origin of the left external carotid artery. Its branches opacify normally. The left internal carotid artery at the bulb and just proximally has a smooth atherosclerotic plaque which extends into the bulb itself along the posterolateral wall. There is an associated stenosis of approximately 20% by the NASCET criteria. More distally, the left internal carotid artery is seen to opacify to the cranial skull base with a focal stenosis of approximately 50% at the cervical petrous junction on the lateral projection. Distal to this wide patency is seen of the petrous, cavernous and supraclinoid segments. The left middle cerebral artery and the left anterior cerebral artery opacify normally into the capillary and venous phases. The right common carotid arteriogram demonstrates wide patency at the origin of the right external carotid artery. Its branches are normally opacified. The right internal carotid artery at the bulb to the cranial skull base opacifies widely. The petrous, cavernous and supraclinoid segments are widely patent. The right middle cerebral artery and the right anterior cerebral artery opacify into the capillary and venous phases. Also noted is partial retrograde opacification via the diminutive right posterior communicating artery of the distal basilar artery and the right posterior cerebral artery. 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 Interval significant improvement of the 2  tandem lesions in the more proximal basilar artery and the left vertebrobasilar junction. 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 Occluded right vertebrobasilar junction just distal to the right posterior-inferior cerebellar artery. PLAN: Findings reviewed with patient, the spouse, and the referring neurologist. Options of continued medical therapy with dual antiplatelets, or consideration of a balloon angioplasty of the severe stenosis of the distal portion of the stented segment of the basilar artery discussed. It was decided to pursue with balloon angioplasty of the distal intra stent severe stenosis of the basilar artery given the patient's failure on the current dual anti-platelet regimen. Electronically Signed   By: Luanne Bras M.D.   On: 02/14/2018 10:58    Treatments: Endovascular angioplasty of basilar artery stenosis  Discharge Exam: Blood pressure 133/81, pulse 85, temperature 98.2 F (36.8 C), temperature source Oral, resp. rate 16, height _0  (1.981 m), weight 272 lb 0.8 oz (123.4 kg), SpO2 97 %. Physical Exam Constitutional:      General: He is not in acute distress.    Appearance: Normal appearance.  Pulmonary:     Effort: Pulmonary effort is normal. No respiratory distress.  Skin:    General: Skin is warm and dry.     Comments: Right groin incision soft without active bleeding or hematoma.  Neurological:     Mental Status: He is alert.     Comments: Alert, awake, and oriented x3. Speech and comprehension intact. PERRL bilaterally. EOMs intact bilaterally without nystagmus or subjective diplopia. Visual fields not assessed. Demonstrates slight left facial droop. Tongue midline. Can spontaneously move all extremities. No pronator drift. Fine motor and coordination intact and symmetric. Gait not assessed. Romberg not assessed. Heel to toe not assessed. Distal pulses 2+ bilaterally.  Psychiatric:        Mood and  Affect: Mood normal.        Behavior: Behavior normal.        Thought Content: Thought content normal.        Judgment: Judgment normal.     Disposition: Discharge disposition: 01-Home or Self Care       Discharge Instructions    Call MD for:  difficulty breathing, headache or visual disturbances   Complete by:  As directed    Call MD for:  extreme fatigue   Complete by:  As directed    Call MD for:  hives   Complete by:  As directed    Call MD for:  persistant dizziness or light-headedness   Complete by:  As directed    Call MD for:  persistant nausea and vomiting   Complete by:  As directed    Call MD for:  redness, tenderness, or signs of infection (pain, swelling, redness, odor or green/yellow discharge around incision site)   Complete by:  As directed    Call MD for:  severe uncontrolled pain   Complete by:  As directed    Call MD for:  temperature >100.4   Complete by:  As directed    Diet - low sodium heart healthy   Complete by:  As directed    Discharge instructions   Complete by:  As directed    Continue taking Brilinta 90 mg twice daily. Continue taking Aspirin 81 mg once daily. No bending, stooping, or lifting more than 10 pounds for 2 weeks. No  driving self for 2 weeks. Stay hydrated by drinking plenty of water. NO smoking.   Increase activity slowly   Complete by:  As directed      Allergies as of 02/26/2018      Reactions   Crestor [rosuvastatin] Other (See Comments)   rhabdomyolosis   Bee Venom Swelling   SWELLING REACTION UNSPECIFIED       Medication List    TAKE these medications   aspirin 81 MG EC tablet Take 1 tablet (81 mg total) by mouth daily. With food   Blood Glucose Monitoring Suppl w/Device Kit Dispense based on patient and insurance preference. Check blood sugar daily before breakfast (ICD9 250.0)   citalopram 10 MG tablet Commonly known as:  CELEXA Take 1 tablet (10 mg total) by mouth daily.   ezetimibe 10 MG  tablet Commonly known as:  ZETIA Take 1 tablet (10 mg total) by mouth daily.   fenofibrate 160 MG tablet Take 1 tablet (160 mg total) by mouth daily.   gabapentin 300 MG capsule Commonly known as:  NEURONTIN Take 1 capsule (300 mg total) by mouth 2 (two) times daily.   glimepiride 2 MG tablet Commonly known as:  AMARYL Take 1 tablet (2 mg total) by mouth daily with breakfast.   levothyroxine 175 MCG tablet Commonly known as:  SYNTHROID, LEVOTHROID Take 175 mcg by mouth daily before breakfast.   losartan 25 MG tablet Commonly known as:  COZAAR Take 0.5 tablets (12.5 mg total) by mouth daily.   nitroGLYCERIN 0.4 MG SL tablet Commonly known as:  NITROSTAT Place 1 tablet (0.4 mg total) under the tongue every 5 (five) minutes as needed for chest pain.   pantoprazole 40 MG tablet Commonly known as:  PROTONIX Take 40 mg by mouth every morning.   sitaGLIPtin-metformin 50-1000 MG tablet Commonly known as:  JANUMET Take 1 tablet by mouth 2 (two) times daily with a meal.   ticagrelor 90 MG Tabs tablet Commonly known as:  BRILINTA Take 1 tablet (90 mg total) by mouth 2 (two) times daily.      Follow-up Information    Luanne Bras, MD Follow up in 2 week(s).   Specialties:  Interventional Radiology, Radiology Why:  Please follow-up with Dr. [name] 2 weeks after discharge. Contact information: [place] [address] [phone]            Electronically Signed: Earley Abide, PA-C 02/26/2018, 8:58 AM   I have spent Less Than 30 Minutes discharging Devin [name].

## 2018-02-28 ENCOUNTER — Other Ambulatory Visit: Payer: Self-pay | Admitting: *Deleted

## 2018-02-28 ENCOUNTER — Encounter (HOSPITAL_COMMUNITY): Payer: Self-pay | Admitting: Interventional Radiology

## 2018-02-28 NOTE — Patient Outreach (Addendum)
[facility]) Care Management  02/28/2018  Varney DailyCarl L Pete 20-Mar-1970 [id number]   EMMI- stroke and Transition of care   RED ON EMMI ALERT Day # 6  Date: Wednesday 02/20/18 1610  Red Alert Reason: Smoked or been around [employer]?yes   Insurance: medicaid potential  [facility] admissions x 4 last one on 02/25/18  ED visits in the last 6 months    Outreach attempt # 3 successful at his home number   Mr Devin [name] reports he is doing better He reports having some headache but going well  He confirms the EMMI question is correct. He continues to smoke but reports he has decreased smoking. Gradually decreased smoking from 2 packs now down to 5 cigarettes/day  Cm provided Smoke cessation counseling and [facility] but he refused  He denies medicine issues and confirms home health was not ordered nor needed  He states for his F/u appointment his sister is to assist because his sister provides transportation as he prefers not to drive at this time related to having a hx of medical issues when driving.  He reports his  appetite is good and his cbgs are good, ranging from 120-130 He denies need for assist with DME  Transition of care assessment completed and CHF needed He confirms "feet swelling"  - [facility] RN CM encouraged obtaining a scale, weighing daily and contacting Dr Wyline MoodBranch with any wt increases to assist with changes in tx plan  Reviewed HF action plan  He does not have a scale and will discuss this with his sister He and CM discussed reading food labels to decrease sodium intake and the standard of less  2,300 milligrams of sodium intake per day He voiced understanding  He reports with his low income his primary grocery list is obtained from [employer].     Social Mr Devin [name] lives alone and divorced . He reports his sister Lynden AngCathy is a Therapist, musichospice nurse and has been following up with him 2-4 times a day He does not drive at this time (he does have a car but  does not like to drive long distances) and his sister or brother in law provides transportation assistance and helps him to understand his medical issues. He is independent with ADLs but gets assist with iADLs from his sister [place]  He confirms he pending medicaid and does have a [place] DSS SW working with him He is receiving food stamps and gets $185/month "basically no income coming in right now" He has been to SSI to sign up for assistance    Conditions  02/25/18 stent placement, 3 stents and 4 blockage, CVA, HTN, CAD, TIA, Memory -short term  no long term issues per pt,DM type 2, CKD, HDL, tobacco abuse, chest pain , hypothyroidism     Medications denies concerns with taking medications as prescribed, affording medications, side effects of medications and questions about medications He states he does not get the flu shot and does not want to get one related to "getting sick from one" several years ago    Appointments He has not been seen by a neurologist Cardiologist is Dr Wyline MoodBranch He states for his F/u appointment his sister is to assist because his sister provides transportation as he prefers not to drive at this time related to having a hx of medical issues when driving.  [facility] CM will close case at this time as patient has been assessed and no needs identified.  Pt encouraged to return a call to [facility] RN CM prn [facility] RN CM sent EMMI educational information on quitting smoking for older adults, low salt diet, DM meal planning and the new food label via mail    Kimberly L. Noelle PennerGibbs, RN, BSN, CCM [facility] Telephonic Care Management Care Coordinator Office number [phone] Mobile number [phone]  Main [facility] number [phone] Fax number [phone]

## 2018-03-22 ENCOUNTER — Ambulatory Visit (INDEPENDENT_AMBULATORY_CARE_PROVIDER_SITE_OTHER): Payer: Self-pay | Admitting: Cardiology

## 2018-03-22 ENCOUNTER — Encounter: Payer: Self-pay | Admitting: Cardiology

## 2018-03-22 VITALS — BP 122/84 | HR 85 | Ht 78.0 in | Wt 265.0 lb

## 2018-03-22 DIAGNOSIS — E782 Mixed hyperlipidemia: Secondary | ICD-10-CM

## 2018-03-22 DIAGNOSIS — I255 Ischemic cardiomyopathy: Secondary | ICD-10-CM

## 2018-03-22 DIAGNOSIS — I2510 Atherosclerotic heart disease of native coronary artery without angina pectoris: Secondary | ICD-10-CM

## 2018-03-22 NOTE — Patient Instructions (Signed)
Medication Instructions:  Your physician recommends that you continue on your current medications as directed. Please refer to the Current Medication list given to you today.   Labwork: I WILL REQUEST LABS FROM PCP  Testing/Procedures: NONE  Follow-Up: Your physician recommends that you schedule a follow-up appointment in: 1 MONTH   Any Other Special Instructions Will Be Listed Below (If Applicable).     If you need a refill on your cardiac medications before your next appointment, please call your pharmacy.   

## 2018-03-22 NOTE — Progress Notes (Signed)
Clinical Summary [name] is a 48 y.o.male seen today for follow up of the following medical problems.    1. CAD/Chest pain - admitted 11/2017 with chest pain concerning for unstable angina.  11/2017 coronary CTA with severe prox LAD disease and possible mid LAD disease and D1.  11/2017 cath LM patent, LAD mid 80 to 90%, LCX distal 90%, OM2 90%, LPDA 90%, RCA small with prox 99% LVgram 35-45% (no echo on file). DES x 2 to LAD, DES to OM,  trifurcation lesion distal LCX, LPL and PDA recs for medical therapy, poor pci target. RCA small vessel treated medically.    - bradycardia on beta blocketers. History of rhabdo on crestor. Low bp and dizzienss on entresto.  01/2018 echo LVEF 35-40% - has had some recent/SOB. DOE at 10-15 yards. No recent edema.  - no chest pain - compliant with meds. Intermittent orthostatic symptoms.    2. Hyperlipidemia - history of rhabdo on crestor  02/2018 TC 211 TG 391 LDL 109 - on zetia. Fenofibrate, had TGs in 700s during recent admission. Note recent severe hypothyroidism and poorly contrlled DM2 likely playing a role.    3. Hypothyroidism - TSH previously above 100, most recently 50. Followed by pcp.     4. History of CVA - history of CVA 11/2017 - had underwent an image-guided diagnostic cerebral angiogram 12/01/2017 with Dr. 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 He then underwent an image-guided cerebral angiogram with revascularization of his vertebrobasilar junction/proximal basilar artery stenosis using stent assisted angioplasty 12/03/2017 by Dr. [name] - later had ISR requiring repeat procedure 02/2018  Past Medical History:  Diagnosis Date  . Asthma   . Chest pain   . Coronary artery disease    a. s/p DES x2 to LAD and DES to OM in 11/2017  . Depression   . Dyspnea   . GERD (gastroesophageal reflux disease)   . Headache   . History of kidney stones   .  History of rhabdomyolysis   . Hyperlipidemia   . Hypertension   . Hypothyroidism   . Ischemic cardiomyopathy    a. 11/2017: echo showing EF of 35-40%, diffuse HK, and Grade 2 DD  . Kidney calculus 2014  . Pericardial effusion    Small, by dobutamine echocardiogram, 04/2006  . PONV (postoperative nausea and vomiting)   . Sleep apnea    does not wear cpap  . Stroke ([place])   . Tobacco abuse   . Type 2 diabetes mellitus without complications ([place]) 06/03/4008  . Vitamin D deficiency      Allergies  Allergen Reactions  . Crestor [Rosuvastatin] Other (See Comments)    rhabdomyolosis  . Bee Venom Swelling    SWELLING REACTION UNSPECIFIED      Current Outpatient Medications  Medication Sig Dispense Refill  . aspirin 81 MG EC tablet Take 1 tablet (81 mg total) by mouth daily. With food 30 tablet 0  . Blood Glucose Monitoring Suppl w/Device KIT Dispense based on patient and insurance preference. Check blood sugar daily before breakfast (ICD9 250.0) 1 each 0  . citalopram (CELEXA) 10 MG tablet Take 1 tablet (10 mg total) by mouth daily. 30 tablet 3  . ezetimibe (ZETIA) 10 MG tablet Take 1 tablet (10 mg total) by mouth daily. 30 tablet 3  . fenofibrate 160 MG tablet Take 1 tablet (160 mg total) by mouth daily. 30 tablet 3  . gabapentin (NEURONTIN) 300 MG capsule Take  1 capsule (300 mg total) by mouth 2 (two) times daily. 60 capsule 4  . glimepiride (AMARYL) 2 MG tablet Take 1 tablet (2 mg total) by mouth daily with breakfast. 30 tablet 0  . levothyroxine (SYNTHROID, LEVOTHROID) 175 MCG tablet Take 175 mcg by mouth daily before breakfast.    . losartan (COZAAR) 25 MG tablet Take 0.5 tablets (12.5 mg total) by mouth daily. 30 tablet 2  . nitroGLYCERIN (NITROSTAT) 0.4 MG SL tablet Place 1 tablet (0.4 mg total) under the tongue every 5 (five) minutes as needed for chest pain. 25 tablet 3  . pantoprazole (PROTONIX) 40 MG tablet Take 40 mg by mouth every morning.    . sitaGLIPtin-metformin  (JANUMET) 50-1000 MG tablet Take 1 tablet by mouth 2 (two) times daily with a meal.    . ticagrelor (BRILINTA) 90 MG TABS tablet Take 1 tablet (90 mg total) by mouth 2 (two) times daily. 60 tablet 3   No current facility-administered medications for this visit.      Past Surgical History:  Procedure Laterality Date  . APPENDECTOMY    . 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 CHOLECYSTECTOMY N/A 01/27/2013   Procedure: LAPAROSCOPIC CHOLECYSTECTOMY;  Surgeon: Jamesetta So, MD;  Location: [facility];  Service: General;  Laterality: N/A;  . CORONARY STENT INTERVENTION N/A 11/15/2017   Procedure: CORONARY STENT INTERVENTION;  Surgeon: Leonie Man, MD;  Location: [place] [facility];  Service: Cardiovascular;  Laterality: N/A;  . HERNIA REPAIR     As a child  . INSERTION OF MESH N/A 11/13/2012   Procedure: INSERTION OF MESH;  Surgeon: Jamesetta So, MD;  Location: [facility];  Service: General;  Laterality: N/A;  . IR ANGIO INTRA EXTRACRAN SEL COM CAROTID INNOMINATE BILAT MOD SED  12/01/2017  . IR ANGIO INTRA EXTRACRAN SEL COM CAROTID INNOMINATE BILAT MOD SED  02/13/2018  . IR ANGIO VERTEBRAL SEL SUBCLAVIAN INNOMINATE UNI L MOD SED  02/13/2018  . IR ANGIO VERTEBRAL SEL VERTEBRAL BILAT MOD SED  12/01/2017  . IR ANGIO VERTEBRAL SEL VERTEBRAL UNI R MOD SED  02/13/2018  . IR INTRA CRAN STENT  12/03/2017  . IR PTA INTRACRANIAL  02/25/2018  . IR US GUIDE VASC ACCESS RIGHT  02/13/2018  . KIDNEY STONE SURGERY    . LEFT HEART CATH AND CORONARY ANGIOGRAPHY N/A 11/15/2017   Procedure: LEFT HEART CATH AND CORONARY ANGIOGRAPHY;  Surgeon: Leonie Man, MD;  Location: [place] [facility];  Service: Cardiovascular;  Laterality: N/A;  . PERCUTANEOUS NEPHROLITHOTRIPSY    . RADIOLOGY WITH ANESTHESIA Left 12/03/2017   Procedure: Angioplasty with possible stenting of left VBJ;  Surgeon: Luanne Bras, MD;  Location: [place];   Service: Radiology;  Laterality: Left;  . RADIOLOGY WITH ANESTHESIA N/A 02/25/2018   Procedure: STENT PLACEMENT;  Surgeon: Luanne Bras, MD;  Location: [place];  Service: Radiology;  Laterality: N/A;  . UMBILICAL HERNIA REPAIR N/A 11/13/2012   Procedure: UMBILICAL HERNIORRHAPHY;  Surgeon: Jamesetta So, MD;  Location: [facility];  Service: General;  Laterality: N/A;  . VARICOCELECTOMY       Allergies  Allergen Reactions  . Crestor [Rosuvastatin] Other (See Comments)    rhabdomyolosis  . Bee Venom Swelling    SWELLING REACTION UNSPECIFIED       Family History  Problem Relation Age of Onset  . Coronary artery disease Father   . Hypertension Father   .  Hyperlipidemia Father   . Diabetes Father   . Congestive Heart Failure Father 59  . Arrhythmia Father        had an ICD  . Diabetes Mother   . Hypertension Mother   . Hyperlipidemia Mother   . Obesity Mother 27       died after bariatric surgery, liver failure and infection  . Coronary artery disease Maternal Grandfather        both grandfathers and several uncles  . Coronary artery disease Other   . Diabetes Sister        both sisters  . Stroke Maternal Aunt   . Stroke Maternal Uncle      Social History Mr. Rod reports that he has been smoking cigarettes. He started smoking about 33 years ago. He has a 13.00 pack-year smoking history. He quit smokeless tobacco use about 31 years ago.  His smokeless tobacco use included snuff and chew. Mr. Wuertz reports no history of alcohol use.   Review of Systems CONSTITUTIONAL: No weight loss, fever, chills, weakness or fatigue.  HEENT: Eyes: No visual loss, blurred vision, double vision or yellow sclerae.No hearing loss, sneezing, congestion, runny nose or sore throat.  SKIN: No rash or itching.  CARDIOVASCULAR: per hpi RESPIRATORY: per hpi GASTROINTESTINAL: No anorexia, nausea, vomiting or diarrhea. No abdominal pain or blood.  GENITOURINARY: No burning on urination, no  polyuria NEUROLOGICAL: No headache, dizziness, syncope, paralysis, ataxia, numbness or tingling in the extremities. No change in bowel or bladder control.  MUSCULOSKELETAL: No muscle, back pain, joint pain or stiffness.  LYMPHATICS: No enlarged nodes. No history of splenectomy.  PSYCHIATRIC: No history of depression or anxiety.  ENDOCRINOLOGIC: No reports of sweating, cold or heat intolerance. No polyuria or polydipsia.  Marland Kitchen   Physical Examination Vitals:   03/22/18 1004 03/22/18 1006  BP: 122/86 122/84  Pulse: 85   SpO2: 98%    Vitals:   03/22/18 1004  Weight: 265 lb (120.2 kg)  Height: _0  (1.981 m)    Gen: resting comfortably, no acute distress HEENT: no scleral icterus, pupils equal round and reactive, no palptable cervical adenopathy,  CV: RRR, no m/r/g, no jvd Resp: Clear to auscultation bilaterally GI: abdomen is soft, non-tender, non-distended, normal bowel sounds, no hepatosplenomegaly MSK: extremities are warm, no edema.  Skin: warm, no rash Neuro:  no focal deficits Psych: appropriate affect   Diagnostic Studies 11/2017 cath  There is moderate left ventricular systolic dysfunction. The left ventricular ejection fraction is 35-45% by visual estimate.  LV end diastolic pressure is mildly elevated.  -----------------------------------------  ANGIOGRAPHY- PCI  LESION #1: Mid LAD lesion is 80% stenosed.  A drug-eluting stent was successfully placed using a STENT SYNERGY DES 2.25X16. -Postdilated to 2.4 mm  Post intervention, there is a 0% residual stenosis.  LESION #2: mid LAD to Dist LAD lesion is 90% stenosed.  A drug-eluting stent was successfully placed using a STENT SYNERGY DES 2.25X12. -Postdilated to 2.4 mm  Post intervention, there is a 0% residual stenosis.  Prox LAD lesion is 40% stenosed -prior to lesion 1; apical, after lesion 2  A drug-eluting stent was successfully placed using a STENT SYNERGY DES 2.25X12. -Postdilated to 2.4  mm  -----------------------------------------  Post intervention, there is a 0% residual stenosis.  Trifurcation 90% lesion at the distal circumflex into LPL and PDA -small caliber distal vessels. Best treated medically as this is not a very good PCI target  Prox small caliber nondominant RCA lesion is 99% stenosed.  Severe distal OM 2 -DES PCI with Synergy 2.25 mm x16 mm (2.4 mm); severe trifurcation disease with a very distal AV groove circumflex prior to PDA and PL Terrez Ander -medical management.  Subtotally occluded nondominant RCA Moderately reduced LVEF of roughly 40% with mildly elevated LVEDP.  Plan: Transfer to postprocedure unit for post PCI monitoring and TR removal Continue aggressive risk factor modification with statin and beta-blocker etc.  Recommend uninterrupted dual antiplatelet therapy with Aspirin 11m daily and Ticagrelor 963mtwice dailyfor a minimum of 12 months (ACS - Class I recommendation).-Would strongly consider reducing to 60 mg dose ticagrelor at 9-12 months and continue for an additional year.  Would be okay to stop aspirin after 3 months if necessary. Okay to stop Brilinta after 6 months for procedures if necessary.     Assessment and Plan  1. CAD/ICM - recent stenting as reported above - some recent SOB/DOE, no chest pain. Multiple possible etiologies including deconditioning due to multiple admissions over the last 4 months including 2 strokes, ongoing severe hypothyroidism. He appears euvolemic. No new chest pain, lower suspicion of recurrent ischemia - monitor symptoms at this time, if progress consider ischemic testing - medical therapy limited due to bradycardia on beta blockers, dizziness/low bp's on entresto, and ongoing orthostatic symptoms.   2. Hyperlipidemia - rhabdo on statins - lipids improving on zetia and fenofibrate. T[place] previously [place] of uncontrolled DM2 and severe hypothyroidism, hopefully as these improve TGs  will also improve   3. CVA - per neuro  Upcoming sleep study evaluation coming up  F/u 1 month    JoArnoldo LenisM.D.

## 2018-03-29 ENCOUNTER — Encounter: Payer: Self-pay | Admitting: *Deleted

## 2018-04-10 ENCOUNTER — Telehealth: Payer: Self-pay

## 2018-04-10 ENCOUNTER — Institutional Professional Consult (permissible substitution): Payer: Self-pay | Admitting: Neurology

## 2018-04-10 NOTE — Telephone Encounter (Signed)
Pt did not show for their appt with Dr. Athar today.  

## 2018-04-13 ENCOUNTER — Encounter (HOSPITAL_COMMUNITY): Payer: Self-pay | Admitting: Interventional Radiology

## 2018-04-22 ENCOUNTER — Encounter: Payer: Self-pay | Admitting: Cardiology

## 2018-04-22 ENCOUNTER — Ambulatory Visit (INDEPENDENT_AMBULATORY_CARE_PROVIDER_SITE_OTHER): Payer: Self-pay | Admitting: Cardiology

## 2018-04-22 VITALS — BP 134/89 | HR 83 | Ht 78.0 in | Wt 256.0 lb

## 2018-04-22 DIAGNOSIS — R0600 Dyspnea, unspecified: Secondary | ICD-10-CM

## 2018-04-22 DIAGNOSIS — I2510 Atherosclerotic heart disease of native coronary artery without angina pectoris: Secondary | ICD-10-CM

## 2018-04-22 NOTE — Progress Notes (Signed)
Clinical Summary Mr. Scales is a 48 y.o.male seen today for follow up of the following medical problems.This is a focused visit on history of CAD and recent DOE.    1. CAD/Chest pain - admitted 11/2017 with chest pain concerning for unstable angina.  11/2017 coronary CTA with severe prox LAD disease and possible mid LAD disease and D1.  11/2017 cath LM patent, LAD mid 80 to 90%, LCX distal 90%, OM2 90%, LPDA 90%, RCA small with prox 99% LVgram 35-45% (no echo on file).DES x 2to LAD, DES to OM, trifurcation lesion distal LCX, LPL and PDA recs for medical therapy, poor pci target. RCA small vessel treated medically.    - bradycardia on beta blocketers. History of rhabdo on crestor.Low bp and dizzienss on entresto.     - SOB/DOE unchanged. DOE 10 to 15 yars.  Reports after initial stents in heart felt good, since then declined.  - no recent chest pain.  - no insurance - awaiting medicaid, awaiting decision from hearing.    Past Medical History:  Diagnosis Date  . Asthma   . Chest pain   . Coronary artery disease    a. s/p DES x2 to LAD and DES to OM in 11/2017  . Depression   . Dyspnea   . GERD (gastroesophageal reflux disease)   . Headache   . History of kidney stones   . History of rhabdomyolysis   . Hyperlipidemia   . Hypertension   . Hypothyroidism   . Ischemic cardiomyopathy    a. 11/2017: echo showing EF of 35-40%, diffuse HK, and Grade 2 DD  . Kidney calculus 2014  . Pericardial effusion    Small, by dobutamine echocardiogram, 04/2006  . PONV (postoperative nausea and vomiting)   . Sleep apnea    does not wear cpap  . Stroke ([place])   . Tobacco abuse   . Type 2 diabetes mellitus without complications ([place]) 07/19/3265  . Vitamin D deficiency      Allergies  Allergen Reactions  . Crestor [Rosuvastatin] Other (See Comments)    rhabdomyolosis  . Bee Venom Swelling    SWELLING REACTION UNSPECIFIED      Current Outpatient Medications    Medication Sig Dispense Refill  . aspirin 81 MG EC tablet Take 1 tablet (81 mg total) by mouth daily. With food 30 tablet 0  . Blood Glucose Monitoring Suppl w/Device KIT Dispense based on patient and insurance preference. Check blood sugar daily before breakfast (ICD9 250.0) 1 each 0  . citalopram (CELEXA) 10 MG tablet Take 1 tablet (10 mg total) by mouth daily. 30 tablet 3  . ezetimibe (ZETIA) 10 MG tablet Take 1 tablet (10 mg total) by mouth daily. 30 tablet 3  . fenofibrate 160 MG tablet Take 1 tablet (160 mg total) by mouth daily. 30 tablet 3  . gabapentin (NEURONTIN) 300 MG capsule Take 1 capsule (300 mg total) by mouth 2 (two) times daily. 60 capsule 4  . glimepiride (AMARYL) 2 MG tablet Take 1 tablet (2 mg total) by mouth daily with breakfast. 30 tablet 0  . levothyroxine (SYNTHROID, LEVOTHROID) 175 MCG tablet Take 175 mcg by mouth daily before breakfast.    . losartan (COZAAR) 25 MG tablet Take 0.5 tablets (12.5 mg total) by mouth daily. 30 tablet 2  . nitroGLYCERIN (NITROSTAT) 0.4 MG SL tablet Place 1 tablet (0.4 mg total) under the tongue every 5 (five) minutes as needed for chest pain. 25 tablet 3  . pantoprazole (  PROTONIX) 40 MG tablet Take 40 mg by mouth every morning.    . ticagrelor (BRILINTA) 90 MG TABS tablet Take 1 tablet (90 mg total) by mouth 2 (two) times daily. 60 tablet 3   No current facility-administered medications for this visit.      Past Surgical History:  Procedure Laterality Date  . APPENDECTOMY    . 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 CHOLECYSTECTOMY N/A 01/27/2013   Procedure: LAPAROSCOPIC CHOLECYSTECTOMY;  Surgeon: Jamesetta So, MD;  Location: [facility];  Service: General;  Laterality: N/A;  . CORONARY STENT INTERVENTION N/A 11/15/2017   Procedure: CORONARY STENT INTERVENTION;  Surgeon: Leonie Man, MD;  Location: [place] [facility];  Service: Cardiovascular;   Laterality: N/A;  . HERNIA REPAIR     As a child  . INSERTION OF MESH N/A 11/13/2012   Procedure: INSERTION OF MESH;  Surgeon: Jamesetta So, MD;  Location: [facility];  Service: General;  Laterality: N/A;  . IR ANGIO INTRA EXTRACRAN SEL COM CAROTID INNOMINATE BILAT MOD SED  12/01/2017  . IR ANGIO INTRA EXTRACRAN SEL COM CAROTID INNOMINATE BILAT MOD SED  02/13/2018  . IR ANGIO VERTEBRAL SEL SUBCLAVIAN INNOMINATE UNI L MOD SED  02/13/2018  . IR ANGIO VERTEBRAL SEL VERTEBRAL BILAT MOD SED  12/01/2017  . IR ANGIO VERTEBRAL SEL VERTEBRAL UNI R MOD SED  02/13/2018  . IR INTRA CRAN STENT  12/03/2017  . IR PTA INTRACRANIAL  02/25/2018  . IR US GUIDE VASC ACCESS RIGHT  02/13/2018  . KIDNEY STONE SURGERY    . LEFT HEART CATH AND CORONARY ANGIOGRAPHY N/A 11/15/2017   Procedure: LEFT HEART CATH AND CORONARY ANGIOGRAPHY;  Surgeon: Leonie Man, MD;  Location: [place] [facility];  Service: Cardiovascular;  Laterality: N/A;  . PERCUTANEOUS NEPHROLITHOTRIPSY    . RADIOLOGY WITH ANESTHESIA Left 12/03/2017   Procedure: Angioplasty with possible stenting of left VBJ;  Surgeon: Luanne Bras, MD;  Location: [place];  Service: Radiology;  Laterality: Left;  . RADIOLOGY WITH ANESTHESIA N/A 02/25/2018   Procedure: STENT PLACEMENT;  Surgeon: Luanne Bras, MD;  Location: [place];  Service: Radiology;  Laterality: N/A;  . UMBILICAL HERNIA REPAIR N/A 11/13/2012   Procedure: UMBILICAL HERNIORRHAPHY;  Surgeon: Jamesetta So, MD;  Location: [facility];  Service: General;  Laterality: N/A;  . VARICOCELECTOMY       Allergies  Allergen Reactions  . Crestor [Rosuvastatin] Other (See Comments)    rhabdomyolosis  . Bee Venom Swelling    SWELLING REACTION UNSPECIFIED       Family History  Problem Relation Age of Onset  . Coronary artery disease Father   . Hypertension Father   . Hyperlipidemia Father   . Diabetes Father   . Congestive Heart Failure Father 57  . Arrhythmia Father        had an ICD  . Diabetes Mother    . Hypertension Mother   . Hyperlipidemia Mother   . Obesity Mother 90       died after bariatric surgery, liver failure and infection  . Coronary artery disease Maternal Grandfather        both grandfathers and several uncles  . Coronary artery disease Other   . Diabetes Sister        both sisters  . Stroke Maternal Aunt   . Stroke Maternal Uncle      Social History Mr. Marchiano reports  that he has been smoking cigarettes. He started smoking about 33 years ago. He has a 13.00 pack-year smoking history. He quit smokeless tobacco use about 31 years ago.  His smokeless tobacco use included snuff and chew. Mr. Harville reports no history of alcohol use.   Review of Systems CONSTITUTIONAL: No weight loss, fever, chills, weakness or fatigue.  HEENT: Eyes: No visual loss, blurred vision, double vision or yellow sclerae.No hearing loss, sneezing, congestion, runny nose or sore throat.  SKIN: No rash or itching.  CARDIOVASCULAR: no chest pain, no palpiations RESPIRATORY: per hpi GASTROINTESTINAL: No anorexia, nausea, vomiting or diarrhea. No abdominal pain or blood.  GENITOURINARY: No burning on urination, no polyuria NEUROLOGICAL: No headache, dizziness, syncope, paralysis, ataxia, numbness or tingling in the extremities. No change in bowel or bladder control.  MUSCULOSKELETAL: No muscle, back pain, joint pain or stiffness.  LYMPHATICS: No enlarged nodes. No history of splenectomy.  PSYCHIATRIC: No history of depression or anxiety.  ENDOCRINOLOGIC: No reports of sweating, cold or heat intolerance. No polyuria or polydipsia.  Marland Kitchen   Physical Examination Vitals:   04/22/18 1351  BP: 134/89  Pulse: 83  SpO2: 98%   Vitals:   04/22/18 1351  Weight: 256 lb (116.1 kg)  Height: _0  (1.981 m)    Gen: resting comfortably, no acute distress HEENT: no scleral icterus, pupils equal round and reactive, no palptable cervical adenopathy,  CV: RRR, no mr/g, no jvd Resp: Clear to  auscultation bilaterally GI: abdomen is soft, non-tender, non-distended, normal bowel sounds, no hepatosplenomegaly MSK: extremities are warm, no edema.  Skin: warm, no rash Neuro:  no focal deficits Psych: appropriate affect   Diagnostic Studies 11/2017 cath  There is moderate left ventricular systolic dysfunction. The left ventricular ejection fraction is 35-45% by visual estimate.  LV end diastolic pressure is mildly elevated.  -----------------------------------------  ANGIOGRAPHY- PCI  LESION #1: Mid LAD lesion is 80% stenosed.  A drug-eluting stent was successfully placed using a STENT SYNERGY DES 2.25X16. -Postdilated to 2.4 mm  Post intervention, there is a 0% residual stenosis.  LESION #2: mid LAD to Dist LAD lesion is 90% stenosed.  A drug-eluting stent was successfully placed using a STENT SYNERGY DES 2.25X12. -Postdilated to 2.4 mm  Post intervention, there is a 0% residual stenosis.  Prox LAD lesion is 40% stenosed -prior to lesion 1; apical, after lesion 2  A drug-eluting stent was successfully placed using a STENT SYNERGY DES 2.25X12. -Postdilated to 2.4 mm  -----------------------------------------  Post intervention, there is a 0% residual stenosis.  Trifurcation 90% lesion at the distal circumflex into LPL and PDA -small caliber distal vessels. Best treated medically as this is not a very good PCI target  Prox small caliber nondominant RCA lesion is 99% stenosed.  Severe distal OM 2 -DES PCI with Synergy 2.25 mm x16 mm (2.4 mm); severe trifurcation disease with a very distal AV groove circumflex prior to PDA and PL Naoki Migliaccio -medical management.  Subtotally occluded nondominant RCA Moderately reduced LVEF of roughly 40% with mildly elevated LVEDP.  Plan: Transfer to postprocedure unit for post PCI monitoring and TR removal Continue aggressive risk factor modification with statin and beta-blocker etc.  Recommend uninterrupted dual antiplatelet  therapy with Aspirin 60m daily and Ticagrelor 96mtwice dailyfor a minimum of 12 months (ACS - Class I recommendation).-Would strongly consider reducing to 60 mg dose ticagrelor at 9-12 months and continue for an additional year.  Would be okay to stop aspirin after 3 months if necessary.  Okay to stop Brilinta after 6 months for procedures if necessary.     Assessment and Plan  1. CAD/ICM - recent stenting as reported above - some recent SOB/DOE, no chest pain. Multiple possible etiologies including deconditioning due to multiple admissions over the last 4 months including 2 strokes, ongoing severe hypothyroidism. - appears euvolemic and has actually lost weight  - would plan for a lexican to reevluate for any recurrent obstructive CAD as the etiology of his symptoms. We will await the decision on his medicaid, we have also given info on [facility] assistance. Would plan for lexiscan when able to obtain without creating significant financial burden for patient.    F/u 1 month      Arnoldo Lenis, M.D.

## 2018-04-22 NOTE — Patient Instructions (Signed)
Medication Instructions:  Your physician recommends that you continue on your current medications as directed. Please refer to the Current Medication list given to you today.  If you need a refill on your cardiac medications before your next appointment, please call your pharmacy.   Lab work: None today If you have labs (blood work) drawn today and your tests are completely normal, you will receive your results only by: Marland Kitchen MyChart Message (if you have MyChart) OR . A paper copy in the mail If you have any lab test that is abnormal or we need to change your treatment, we will call you to review the results.  Testing/Procedures: Call us back when Medicaid covers you. We will schedule a lexiscan.  Follow-Up: At [facility], you and your health needs are our priority.  As part of our continuing mission to provide you with exceptional heart care, we have created designated Provider Care Teams.  These Care Teams include your primary Cardiologist (physician) and Advanced Practice Providers (APPs -  Physician Assistants and Nurse Practitioners) who all work together to provide you with the care you need, when you need it. You will need a follow up appointment in 3 months.  Please call our office 2 months in advance to schedule this appointment.  You may see Dina Rich, MD or one of the following Advanced Practice Providers on your designated Care Team:   Randall An, PA-C [facility]) . Jacolyn Reedy, PA-C [facility] Office)  Any Other Special Instructions Will Be Listed Below (If Applicable).    Patient assistance at [facility] [phone]

## 2018-04-23 ENCOUNTER — Telehealth (HOSPITAL_COMMUNITY): Payer: Self-pay

## 2018-04-23 NOTE — Telephone Encounter (Signed)
Called to schedule 2 wk f/u, no answer, left vm. AW 

## 2018-05-01 ENCOUNTER — Other Ambulatory Visit (HOSPITAL_COMMUNITY): Payer: Self-pay | Admitting: Interventional Radiology

## 2018-05-01 ENCOUNTER — Telehealth (HOSPITAL_COMMUNITY): Payer: Self-pay

## 2018-05-01 DIAGNOSIS — I771 Stricture of artery: Secondary | ICD-10-CM

## 2018-05-01 NOTE — Telephone Encounter (Signed)
Called to schedule f/u, no answer, left vm. AW 

## 2018-05-20 ENCOUNTER — Ambulatory Visit (HOSPITAL_COMMUNITY)
Admission: RE | Admit: 2018-05-20 | Discharge: 2018-05-20 | Disposition: A | Discharge status: Home / Self Care | Payer: Medicaid Other | Source: Ambulatory Visit | Attending: Interventional Radiology | Admitting: Interventional Radiology

## 2018-05-20 DIAGNOSIS — I771 Stricture of artery: Secondary | ICD-10-CM

## 2018-05-20 NOTE — Progress Notes (Signed)
Chief Complaint: Patient was seen in consultation today for basilar artery stenosis s/p revascularization.  Referring Physician(s): Rosalin Hawking  Supervising Physician: Luanne Bras  Patient Status: [facility] - Out-pt  History of Present Illness: Devin [name] is a 48 y.o. male with a past medical history as below, with pertinent past medical history including hypertension, hyperlipidemia, collagen vascular disease, pericardial effusion 04/2006, CAD, CVA 11/2017, asthma, GERD, nephrolithiasis, renal insufficiency, hypothyroidism, diabetes mellitus type II, vitamin D deficiency, sickle cell anemia, multiple myeloma, rhabdomyolysis, and tobacco abuse. He is known to [facility] and has been followed by Dr. [name] since 11/2017. He first presented as a code stroke in 11/2017. He underwent an image-guided diagnostic cerebral arteriogram 12/01/2017 with Dr. 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 He then underwent an image-guided cerebral arteriogram with revascularization of his vertebrobasilar junction/proximal basilar artery stenosis using stent assisted angioplasty 12/03/2017 by Dr. [name]. He was discharged home 12/05/2017 in stable condition.He was doing well post-procedure, until 02/08/2018 when he presented to [facility] ED with complaint of acute onset of right-sided facial numbness. He was found to have an acute ischemic pontine stroke, and was transferred/admitted to [facility] for management. He underwent an image-guided diagnostic cerebral arteriogram 02/13/2018 by Dr. [name] which revealed mid basilar artery intra-stent stenosis. At that time, patient decided to pursue endovascular revascularization of his mid basilar artery intra-stent stenosis. He then underwent an image-guided cerebral arteriogram with revascularization of his mid basilar artery intra-stent stenosis using balloon angioplasty 02/25/2018 by Dr. [name]. He was discharged home  02/26/2018 in stable condition.  Patient presents today for follow-up regarding his recent procedure 02/26/2018. Patient awake and alert sitting in chair. Accompanied by sister. Complains of constant right temporal headaches x 7 days. Rates headaches as 5/10. States that they are worse when he wakes up in the AM. Describes headache as a "pounding feeling" and states that he can sometimes feel this feeling in his left eye. Complains of intermittent dizziness. Denies syncope associated with dizziness. Complains of intermittent worsening of vision. States his vision does not disappear and states his vision is not blurry/double/tunneled/curtain over eyes, but describes vision as "everything shrinks and is far away". Complains of constant bilateral tinnitus. Complains of intermittent slurred speech. Complains of mood swings and memory issues. Denies weakness, numbness/tingling, or hearing changes.  Patient is currently taking Brilinta 90 mg twice daily and Aspirin 81 mg once daily.   Past Medical History:  Diagnosis Date  . Asthma   . Chest pain   . Coronary artery disease    a. s/p DES x2 to LAD and DES to OM in 11/2017  . Depression   . Dyspnea   . GERD (gastroesophageal reflux disease)   . Headache   . History of kidney stones   . History of rhabdomyolysis   . Hyperlipidemia   . Hypertension   . Hypothyroidism   . Ischemic cardiomyopathy    a. 11/2017: echo showing EF of 35-40%, diffuse HK, and Grade 2 DD  . Kidney calculus 2014  . Pericardial effusion    Small, by dobutamine echocardiogram, 04/2006  . PONV (postoperative nausea and vomiting)   . Sleep apnea    does not wear cpap  . Stroke ([place])   . Tobacco abuse   . Type 2 diabetes mellitus without complications ([place]) 0/10/6759  . Vitamin D deficiency     Past Surgical History:  Procedure Laterality Date  . APPENDECTOMY    . CARDIAC CATHETERIZATION  09/2012   "  Nonobstructive CAD with 30% proximal, 40% mid LAD disease;  30% proximal CFX; EF 55-65%"  . CHOLECYSTECTOMY N/A 01/27/2013   Procedure: LAPAROSCOPIC CHOLECYSTECTOMY;  Surgeon: Jamesetta So, MD;  Location: [facility];  Service: General;  Laterality: N/A;  . CORONARY STENT INTERVENTION N/A 11/15/2017   Procedure: CORONARY STENT INTERVENTION;  Surgeon: Leonie Man, MD;  Location: [facility];  Service: Cardiovascular;  Laterality: N/A;  . HERNIA REPAIR     As a child  . INSERTION OF MESH N/A 11/13/2012   Procedure: INSERTION OF MESH;  Surgeon: Jamesetta So, MD;  Location: [facility];  Service: General;  Laterality: N/A;  . IR ANGIO INTRA EXTRACRAN SEL COM CAROTID INNOMINATE BILAT MOD SED  12/01/2017  . IR ANGIO INTRA EXTRACRAN SEL COM CAROTID INNOMINATE BILAT MOD SED  02/13/2018  . IR ANGIO VERTEBRAL SEL SUBCLAVIAN INNOMINATE UNI L MOD SED  02/13/2018  . IR ANGIO VERTEBRAL SEL VERTEBRAL BILAT MOD SED  12/01/2017  . IR ANGIO VERTEBRAL SEL VERTEBRAL UNI R MOD SED  02/13/2018  . IR INTRA CRAN STENT  12/03/2017  . IR PTA INTRACRANIAL  02/25/2018  . IR US GUIDE VASC ACCESS RIGHT  02/13/2018  . KIDNEY STONE SURGERY    . LEFT HEART CATH AND CORONARY ANGIOGRAPHY N/A 11/15/2017   Procedure: LEFT HEART CATH AND CORONARY ANGIOGRAPHY;  Surgeon: Leonie Man, MD;  Location: [facility];  Service: Cardiovascular;  Laterality: N/A;  . PERCUTANEOUS NEPHROLITHOTRIPSY    . RADIOLOGY WITH ANESTHESIA Left 12/03/2017   Procedure: Angioplasty with possible stenting of left VBJ;  Surgeon: Luanne Bras, MD;  Location: [facility];  Service: Radiology;  Laterality: Left;  . RADIOLOGY WITH ANESTHESIA N/A 02/25/2018   Procedure: STENT PLACEMENT;  Surgeon: Luanne Bras, MD;  Location: [place];  Service: Radiology;  Laterality: N/A;  . UMBILICAL HERNIA REPAIR N/A 11/13/2012   Procedure: UMBILICAL HERNIORRHAPHY;  Surgeon: Jamesetta So, MD;  Location: [facility];  Service: General;  Laterality: N/A;  . VARICOCELECTOMY      Allergies: Crestor [rosuvastatin] and Bee  venom  Medications: Prior to Admission medications   Medication Sig Start Date End Date Taking? Authorizing Provider  aspirin 81 MG EC tablet Take 1 tablet (81 mg total) by mouth daily. With food 12/05/17   Roxan Hockey, MD  Blood Glucose Monitoring Suppl w/Device KIT Dispense based on patient and insurance preference. Check blood sugar daily before breakfast (ICD9 250.0) 04/29/17   Pollina, Gwenyth Allegra, MD  citalopram (CELEXA) 10 MG tablet Take 1 tablet (10 mg total) by mouth daily. 01/04/18   Venancio Poisson, NP  ezetimibe (ZETIA) 10 MG tablet Take 1 tablet (10 mg total) by mouth daily. 12/06/17   Roxan Hockey, MD  fenofibrate 160 MG tablet Take 1 tablet (160 mg total) by mouth daily. 02/13/18   Rai, Vernelle Emerald, MD  gabapentin (NEURONTIN) 300 MG capsule Take 1 capsule (300 mg total) by mouth 2 (two) times daily. 01/04/18   Venancio Poisson, NP  glimepiride (AMARYL) 2 MG tablet Take 1 tablet (2 mg total) by mouth daily with breakfast. 11/16/17   Patrecia Pour, MD  levothyroxine (SYNTHROID, LEVOTHROID) 175 MCG tablet Take 175 mcg by mouth daily before breakfast.    [provider]  losartan (COZAAR) 25 MG tablet Take 0.5 tablets (12.5 mg total) by mouth daily. 01/15/18   Strader, Fransisco Hertz, PA-C  nitroGLYCERIN (NITROSTAT) 0.4 MG SL tablet Place 1 tablet (0.4 mg total) under the tongue every 5 (five)  minutes as needed for chest pain. 01/15/18   Strader, Fransisco Hertz, PA-C  pantoprazole (PROTONIX) 40 MG tablet Take 40 mg by mouth every morning.    [provider]  ticagrelor (BRILINTA) 90 MG TABS tablet Take 1 tablet (90 mg total) by mouth 2 (two) times daily. 02/13/18   Mendel Corning, MD     Family History  Problem Relation Age of Onset  . Coronary artery disease Father   . Hypertension Father   . Hyperlipidemia Father   . Diabetes Father   . Congestive Heart Failure Father 29  . Arrhythmia Father        had an ICD  . Diabetes Mother   . Hypertension Mother   .  Hyperlipidemia Mother   . Obesity Mother 46       died after bariatric surgery, liver failure and infection  . Coronary artery disease Maternal Grandfather        both grandfathers and several uncles  . Coronary artery disease Other   . Diabetes Sister        both sisters  . Stroke Maternal Aunt   . Stroke Maternal Uncle     Social History   Socioeconomic History  . Marital status: Divorced    Spouse name: Not on file  . Number of children: Not on file  . Years of education: Not on file  . Highest education level: Not on file  Occupational History    Comment:      Employer: [employer]  Social Needs  . Financial resource strain: Not on file  . Food insecurity:    Worry: Not on file    Inability: Not on file  . Transportation needs:    Medical: Not on file    Non-medical: Not on file  Tobacco Use  . Smoking status: Current Some Day Smoker    Packs/day: 0.25    Years: 26.00    Pack years: 6.50    Types: Cigarettes    Start date: 03/13/1985  . Smokeless tobacco: Former Systems developer    Types: Snuff, Sarina Ser    Quit date: 03/14/1987  Substance and Sexual Activity  . Alcohol use: No  . Drug use: No  . Sexual activity: Not on file  Lifestyle  . Physical activity:    Days per week: Not on file    Minutes per session: Not on file  . Stress: Not on file  Relationships  . Social connections:    Talks on phone: Not on file    Gets together: Not on file    Attends religious service: Not on file    Active member of club or organization: Not on file    Attends meetings of clubs or organizations: Not on file    Relationship status: Not on file  Other Topics Concern  . Not on file  Social History Narrative   Eats fast food daily   No exercise outside of work   Lives in [place] with his dog   MAINTENANCE TECHNICIAN     Review of Systems: A 12 point ROS discussed and pertinent positives are indicated in the HPI above.  All other systems are negative.  Review of Systems    Constitutional: Negative for chills and fever.  HENT: Positive for tinnitus. Negative for hearing loss.   Eyes: Positive for visual disturbance.  Respiratory: Negative for shortness of breath and wheezing.   Cardiovascular: Negative for chest pain and palpitations.  Neurological: Positive for dizziness, speech difficulty and headaches. Negative  for syncope, weakness and numbness.  Psychiatric/Behavioral: Negative for behavioral problems and confusion.    Physical Exam Constitutional:      General: He is not in acute distress.    Appearance: Normal appearance.  Pulmonary:     Effort: Pulmonary effort is normal. No respiratory distress.  Skin:    General: Skin is warm and dry.  Neurological:     Mental Status: He is alert and oriented to person, place, and time.  Psychiatric:        Mood and Affect: Mood normal.        Behavior: Behavior normal.        Thought Content: Thought content normal.        Judgment: Judgment normal.      Labs:  CBC: Recent Labs    02/08/18 2104 02/08/18 2107 02/10/18 0555 02/12/18 0355 02/26/18 0415  WBC 9.9  --  8.8 9.8 12.8*  HGB 13.6 14.3 13.1 13.1 11.7*  HCT 42.9 42.0 42.4 41.7 37.3*  PLT 352  --  323 323 300    COAGS: Recent Labs    11/30/17 1928 01/05/18 1654 02/08/18 2104 02/25/18 0629  INR 1.02 0.93 0.98 0.91  APTT 35 28 34  --     BMP: Recent Labs    02/08/18 2104 02/08/18 2107 02/10/18 0555 02/12/18 0355 02/26/18 0415  NA 139 140 139 138 139  K 3.8 3.9 4.1 3.8 3.8  CL 104 105 104 102 106  CO2 24  --  _0 GLUCOSE 114* 109* 142* 119* 141*  BUN _1 CALCIUM 9.6  --  9.2 9.3 8.9  CREATININE 1.21 1.30* 1.13 1.24 1.00  GFRNONAA >60  --  >60 >60 >60  GFRAA >60  --  >60 >60 >60    LIVER FUNCTION TESTS: Recent Labs    01/05/18 1654 01/06/18 0556 02/08/18 2104  BILITOT 0.6 0.5 0.4  AST _2 ALT _3 ALKPHOS 60 59 63  PROT 7.6 6.8 8.6*  ALBUMIN 4.3 3.8 4.5     Assessment and  Plan:  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 [name]; mid basilar artery intra-stent stenosis s/p revascularization using balloon angioplasty 02/25/2018 by Dr. [name]. Dr. [name] was present for consultation. Reviewed imaging with patient and sister. Brought to their attention was patient's mid basilar artery intra-stent stenosis, both prior to and following balloon angioplasty. Explained that the best course of management for patient's vertebrobasilar junction/proximal basilar artery stenosis at this time is with routine imaging scans to monitor for changes.  Discussed patient's symptoms of headache and tunneled vision. Explained that it would be unusual for intracranial stenosis to cause these symptoms (especially lateralized symptoms to one side). Recommend that patient follow-up with his neurologist regarding these symptoms.  Discussed patient's diabetes mellitus. States that his sugars run in the 120s-130s. Instructed patient to follow-up with his PCP regularly regarding diabetes mellitus management.  Discussed patient's tobacco use. Patient states that he is trying to quit, but is back to smoking 1 PPD. Strongly advised patient to discontinue smoking at this time, as it is known to increase patient's risk of intra-stent stenosis.  Plan for follow-up with a CTA head/neck (with contrast) 4 months from procedure 02/25/2018. Informed patient that our schedulers will call him to set up this imaging scan. Instructed patient to continue taking Brilinta 90 mg twice daily and Aspirin 81 mg once daily. Instructed patient  to follow-up with his neurologist regularly.  All questions answered and concerns addressed. Patient and sister convey understanding and agree with plan.  Thank you for this interesting consult.  I greatly enjoyed meeting Devin [name] and look forward to participating in their care.   A copy of this report was sent to the requesting provider on this date.  Electronically Signed: Earley Abide, PA-C 05/20/2018, 10:21 AM   I spent a total of 40 Minutes in face to face in clinical consultation, greater than 50% of which was counseling/coordinating care for basilar artery stenosis s/p revascularization.

## 2018-07-02 ENCOUNTER — Telehealth: Payer: Self-pay

## 2018-07-02 NOTE — Telephone Encounter (Signed)
Left vm for pts sister Lynden Ang about setting up video visit due to COVID 19 at pts schedule appt time 4/26 at 0945am. Need to know if pt has phone with a camera and a valid email address.

## 2018-07-04 NOTE — Telephone Encounter (Signed)
LVM for Devin [name] to return our call about setting up a webex visit for her brother. Office number was provided.

## 2018-07-08 ENCOUNTER — Other Ambulatory Visit: Payer: Self-pay

## 2018-07-08 ENCOUNTER — Ambulatory Visit: Payer: Self-pay | Admitting: Adult Health

## 2018-07-19 ENCOUNTER — Telehealth: Payer: Self-pay

## 2018-07-19 NOTE — Telephone Encounter (Signed)
Virtual Visit Pre-Appointment Phone Call  "(Name), I am calling you today to discuss your upcoming appointment. We are currently trying to limit exposure to the virus that causes COVID-19 by seeing patients at home rather than in the office."  1. "What is the BEST phone number to call the day of the visit?" - include this in appointment notes  2. "Do you have or have access to (through a family member/friend) a smartphone with video capability that we can use for your visit?" a. If yes - list this number in appt notes as "cell" (if different from BEST phone #) and list the appointment type as a VIDEO visit in appointment notes b. If no - list the appointment type as a PHONE visit in appointment notes  3. Confirm consent - "In the setting of the current Covid19 crisis, you are scheduled for a (phone or video) visit with your provider on (date) at (time).  Just as we do with many in-office visits, in order for you to participate in this visit, we must obtain consent.  If you'd like, I can send this to your mychart (if signed up) or email for you to review.  Otherwise, I can obtain your verbal consent now.  All virtual visits are billed to your insurance company just like a normal visit would be.  By agreeing to a virtual visit, we'd like you to understand that the technology does not allow for your provider to perform an examination, and thus may limit your provider's ability to fully assess your condition. If your provider identifies any concerns that need to be evaluated in person, we will make arrangements to do so.  Finally, though the technology is pretty good, we cannot assure that it will always work on either your or our end, and in the setting of a video visit, we may have to convert it to a phone-only visit.  In either situation, we cannot ensure that we have a secure connection.  Are you willing to proceed?" STAFF: Did the patient verbally acknowledge consent to telehealth visit? Document  YES/NO here: YES   4. Advise patient to be prepared - "Two hours prior to your appointment, go ahead and check your blood pressure, pulse, oxygen saturation, and your weight (if you have the equipment to check those) and write them all down. When your visit starts, your provider will ask you for this information. If you have an Apple Watch or Kardia device, please plan to have heart rate information ready on the day of your appointment. Please have a pen and paper handy nearby the day of the visit as well."  5. Give patient instructions for MyChart download to smartphone OR [email or web address] as below if video visit (depending on what platform provider is using)  6. Inform patient they will receive a phone call 15 minutes prior to their appointment time (may be from unknown caller ID) so they should be prepared to answer    TELEPHONE CALL NOTE  Devin [name] has been deemed a candidate for a follow-up tele-health visit to limit community exposure during the Covid-19 pandemic. I spoke with the patient via phone to ensure availability of phone/video source, confirm preferred email & phone number, and discuss instructions and expectations.  I reminded Devin [name] to be prepared with any vital sign and/or heart rhythm information that could potentially be obtained via home monitoring, at the time of his visit. I reminded Devin [name] to expect a phone call prior  to his visit.  Fonnie BirkenheadLisa R Darrah Dredge, CMA 07/19/2018 2:06 PM   INSTRUCTIONS FOR DOWNLOADING THE MYCHART APP TO SMARTPHONE  - The patient must first make sure to have activated MyChart and know their login information - If Apple, go to [employer] Store and type in MyChart in the search bar and download the app. If Android, ask patient to go to [employer] Play Store and type in [facility] in the search bar and download the app. The app is free but as with any other app downloads, their phone may require them to verify saved payment information or Apple/Android  password.  - The patient will need to then log into the app with their MyChart username and password, and select [facility] as their healthcare provider to link the account. When it is time for your visit, go to the MyChart app, find appointments, and click Begin Video Visit. Be sure to Select Allow for your device to access the Microphone and Camera for your visit. You will then be connected, and your provider will be with you shortly.  **If they have any issues connecting, or need assistance please contact MyChart service desk [phone])**  **If using a computer, in order to ensure the best quality for their visit they will need to use either of the following Internet Browsers: [employer] Edge, or Google Chrome**  IF USING DOXIMITY or DOXY.ME - The patient will receive a link just prior to their visit by text.     FULL LENGTH CONSENT FOR TELE-HEALTH VISIT   I hereby voluntarily request, consent and authorize [facility] and its employed or contracted physicians, physician assistants, nurse practitioners or other licensed health care professionals (the Practitioner), to provide me with telemedicine health care services (the "Services") as deemed necessary by the treating Practitioner. I acknowledge and consent to receive the Services by the Practitioner via telemedicine. I understand that the telemedicine visit will involve communicating with the Practitioner through live audiovisual communication technology and the disclosure of certain medical information by electronic transmission. I acknowledge that I have been given the opportunity to request an in-person assessment or other available alternative prior to the telemedicine visit and am voluntarily participating in the telemedicine visit.  I understand that I have the right to withhold or withdraw my consent to the use of telemedicine in the course of my care at any time, without affecting my right to future care or treatment,  and that the Practitioner or I may terminate the telemedicine visit at any time. I understand that I have the right to inspect all information obtained and/or recorded in the course of the telemedicine visit and may receive copies of available information for a reasonable fee.  I understand that some of the potential risks of receiving the Services via telemedicine include:  Marland Kitchen. Delay or interruption in medical evaluation due to technological equipment failure or disruption; . Information transmitted may not be sufficient (e.g. poor resolution of images) to allow for appropriate medical decision making by the Practitioner; and/or  . In rare instances, security protocols could fail, causing a breach of personal health information.  Furthermore, I acknowledge that it is my responsibility to provide information about my medical history, conditions and care that is complete and accurate to the best of my ability. I acknowledge that Practitioner's advice, recommendations, and/or decision may be based on factors not within their control, such as incomplete or inaccurate data provided by me or distortions of diagnostic images or specimens that may result from electronic  transmissions. I understand that the practice of medicine is not an exact science and that Practitioner makes no warranties or guarantees regarding treatment outcomes. I acknowledge that I will receive a copy of this consent concurrently upon execution via email to the email address I last provided but may also request a printed copy by calling the office of [facility] [place].    I understand that my insurance will be billed for this visit.   I have read or had this consent read to me. . I understand the contents of this consent, which adequately explains the benefits and risks of the Services being provided via telemedicine.  . I have been provided ample opportunity to ask questions regarding this consent and the Services and have had my questions  answered to my satisfaction. . I give my informed consent for the services to be provided through the use of telemedicine in my medical care  By participating in this telemedicine visit I agree to the above.

## 2018-07-19 NOTE — Telephone Encounter (Signed)
Pt agrees to virtual phone visit. Is unable to obtain vitals- can go over medications.

## 2018-07-20 ENCOUNTER — Other Ambulatory Visit: Payer: Self-pay | Admitting: Adult Health

## 2018-07-20 DIAGNOSIS — G459 Transient cerebral ischemic attack, unspecified: Secondary | ICD-10-CM

## 2018-07-22 NOTE — Telephone Encounter (Signed)
Initially prescribed 01/04/2018 with only 3 refills of 30-day supply therefore prescription would have ran out in 03/2018.  He will need to either schedule follow-up visit within our office or follow-up with PCP for Celexa refills

## 2018-07-26 ENCOUNTER — Telehealth (INDEPENDENT_AMBULATORY_CARE_PROVIDER_SITE_OTHER): Payer: Self-pay | Admitting: Cardiology

## 2018-07-26 VITALS — Ht 78.0 in | Wt 269.0 lb

## 2018-07-26 DIAGNOSIS — R0600 Dyspnea, unspecified: Secondary | ICD-10-CM

## 2018-07-26 DIAGNOSIS — I2510 Atherosclerotic heart disease of native coronary artery without angina pectoris: Secondary | ICD-10-CM

## 2018-07-26 MED ORDER — CLOPIDOGREL BISULFATE 75 MG PO TABS: ORAL_TABLET | ORAL | 3 refills | Status: DC

## 2018-07-26 NOTE — Addendum Note (Signed)
Addended by: Abelino Derrick R on: 07/26/2018 10:41 AM   Modules accepted: Orders

## 2018-07-26 NOTE — Progress Notes (Signed)
Medication Instructions:  Stop BRILINTA  Start PLAVIX  - take 300 mg on day 1 ONLY (4 TABLETS), THEN TAKE 75 MG DAILY (1 TABLET)  Labwork: I WILL REQUEST LABS FROM PCP   Testing/Procedures: NONE  Follow-Up: Your physician recommends that you schedule a follow-up appointment in: 6 WEEKS   Any Other Special Instructions Will Be Listed Below (If Applicable).     If you need a refill on your cardiac medications before your next appointment, please call your pharmacy.

## 2018-07-26 NOTE — Progress Notes (Signed)
Virtual Visit via Telephone Note   This visit type was conducted due to national recommendations for restrictions regarding the COVID-19 Pandemic (e.g. social distancing) in an effort to limit this patient's exposure and mitigate transmission in our community.  Due to his co-morbid illnesses, this patient is at least at moderate risk for complications without adequate follow up.  This format is felt to be most appropriate for this patient at this time.  The patient did not have access to video technology/had technical difficulties with video requiring transitioning to audio format only (telephone).  All issues noted in this document were discussed and addressed.  No physical exam could be performed with this format.  Please refer to the patient's chart for his  consent to telehealth for [facility].   Date:  07/26/2018   ID:  Devin [name], DOB 1970-06-16, MRN [id number]  Patient Location: Home Provider Location: Home  PCP:  [facility], [facility]  Cardiologist:  Dina Rich, MD  Electrophysiologist:  None   Evaluation Performed:  Follow-Up Visit  Chief Complaint:  3 month follow up  History of Present Illness:    Devin [name] is a 48 y.o. male seen today for follow up of the following medical rpoblems.    1. CAD/Chest pain/Chronic systolic HF - admitted 11/2017 with chest pain concerning for unstable angina.  11/2017 coronary CTA with severe prox LAD disease and possible mid LAD disease and D1.  11/2017 cath LM patent, LAD mid 80 to 90%, LCX distal 90%, OM2 90%, LPDA 90%, RCA small with prox 99% LVgram 35-45% (no echo on file).DES x 2to LAD, DES to OM, trifurcation lesion distal LCX, LPL and PDA recs for medical therapy, poor pci target. RCA small vessel treated medically.   01/2018 echo: LVEF 35-40%, grade I diastoluc dysfunction  - bradycardia on beta blocketers. History of rhabdo on crestor.Low bp and dizzienss on entresto.     - SOB/DOE  unchanged. DOE 10 to 15 yars.  Reports after initial stents in heart felt good, since then declined.  - no recent chest pain.  - no insurance - awaiting medicaid, awaiting decision from hearing.   - breathing somewhat worst since last visit. DOE walking dogs at 100 yards. No chest pain. No symptoms at rest. Dizziness with exertion only, no orhtostatic symptoms.   2. Hyperlipidemia - history of rhabdo on crestor  02/2018 TC 211 TG 391 LDL 109 - on zetia. Fenofibrate, had TGs in 700s during recent admission. Note recent severe hypothyroidism and poorly contrlled DM2 likely playing a role.    3. Hypothyroidism - TSH previously above 100, most recently 60. Followed by pcp.     4. History of CVA - history of CVA 11/2017 - had underwent an image-guided diagnostic cerebral angiogram 12/01/2017 with Dr. 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 He then underwent an image-guided cerebral angiogram with revascularization of his vertebrobasilar junction/proximal basilar artery stenosis using stent assisted angioplasty 12/03/2017 by Dr. Corliss Skains - later had ISR requiring repeat procedure 02/2018     The patient does not have symptoms concerning for COVID-19 infection (fever, chills, cough, or new shortness of breath).    Past Medical History:  Diagnosis Date  . Asthma   . Chest pain   . Coronary artery disease    a. s/p DES x2 to LAD and DES to OM in 11/2017  . Depression   . Dyspnea   . GERD (gastroesophageal reflux disease)   .  Headache   . History of kidney stones   . History of rhabdomyolysis   . Hyperlipidemia   . Hypertension   . Hypothyroidism   . Ischemic cardiomyopathy    a. 11/2017: echo showing EF of 35-40%, diffuse HK, and Grade 2 DD  . Kidney calculus 2014  . Pericardial effusion    Small, by dobutamine echocardiogram, 04/2006  . PONV (postoperative nausea and vomiting)   . Sleep apnea    does not  wear cpap  . Stroke (HCC)   . Tobacco abuse   . Type 2 diabetes mellitus without complications (HCC) 10/07/2013  . Vitamin D deficiency    Past Surgical History:  Procedure Laterality Date  . APPENDECTOMY    . 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 CHOLECYSTECTOMY N/A 01/27/2013   Procedure: LAPAROSCOPIC CHOLECYSTECTOMY;  Surgeon: Dalia HeadingMark A Jenkins, MD;  Location: [facility];  Service: General;  Laterality: N/A;  . CORONARY STENT INTERVENTION N/A 11/15/2017   Procedure: CORONARY STENT INTERVENTION;  Surgeon: Marykay LexHarding, David W, MD;  [facility];  Service: Cardiovascular;  Laterality: N/A;  . HERNIA REPAIR     As a child  . INSERTION OF MESH N/A 11/13/2012   Procedure: INSERTION OF MESH;  Surgeon: Dalia HeadingMark A Jenkins, MD;  Location: [facility];  Service: General;  Laterality: N/A;  . IR ANGIO INTRA EXTRACRAN SEL COM CAROTID INNOMINATE BILAT MOD SED  12/01/2017  . IR ANGIO INTRA EXTRACRAN SEL COM CAROTID INNOMINATE BILAT MOD SED  02/13/2018  . IR ANGIO VERTEBRAL SEL SUBCLAVIAN INNOMINATE UNI L MOD SED  02/13/2018  . IR ANGIO VERTEBRAL SEL VERTEBRAL BILAT MOD SED  12/01/2017  . IR ANGIO VERTEBRAL SEL VERTEBRAL UNI R MOD SED  02/13/2018  . IR INTRA CRAN STENT  12/03/2017  . IR PTA INTRACRANIAL  02/25/2018  . IR US GUIDE VASC ACCESS RIGHT  02/13/2018  . KIDNEY STONE SURGERY    . LEFT HEART CATH AND CORONARY ANGIOGRAPHY N/A 11/15/2017   Procedure: LEFT HEART CATH AND CORONARY ANGIOGRAPHY;  Surgeon: Marykay LexHarding, David W, MD;  [facility];  Service: Cardiovascular;  Laterality: N/A;  . PERCUTANEOUS NEPHROLITHOTRIPSY    . RADIOLOGY WITH ANESTHESIA Left 12/03/2017   Procedure: Angioplasty with possible stenting of left VBJ;  Surgeon: Julieanne Cottoneveshwar, Sanjeev, MD;  Location: [facility] OR;  Service: Radiology;  Laterality: Left;  . RADIOLOGY WITH ANESTHESIA N/A 02/25/2018   Procedure: STENT PLACEMENT;  Surgeon: Julieanne Cottoneveshwar,  Sanjeev, MD;  [facility];  Service: Radiology;  Laterality: N/A;  . UMBILICAL HERNIA REPAIR N/A 11/13/2012   Procedure: UMBILICAL HERNIORRHAPHY;  Surgeon: Dalia HeadingMark A Jenkins, MD;  Location: [facility];  Service: General;  Laterality: N/A;  . VARICOCELECTOMY       No outpatient medications have been marked as taking for the 07/26/18 encounter (Appointment) with Antoine PocheBranch, Sholonda Jobst F, MD.     Allergies:   Crestor [rosuvastatin] and Bee venom   Social History   Tobacco Use  . Smoking status: Current Some Day Smoker    Packs/day: 0.25    Years: 26.00    Pack years: 6.50    Types: Cigarettes    Start date: 03/13/1985  . Smokeless tobacco: Former User    Types: Snuff, Dorna BloomChew    Quit date: 03/14/1987  Substance Use Topics  . Alcohol use: No  . Drug use: No     Family Hx: The patient's family history includes  Arrhythmia in his father; Congestive Heart Failure (age of onset: 31) in his father; Coronary artery disease in his father, maternal grandfather, and another family member; Diabetes in his father, mother, and sister; Hyperlipidemia in his father and mother; Hypertension in his father and mother; Obesity (age of onset: 54) in his mother; Stroke in his maternal aunt and maternal uncle.  ROS:   Please see the history of present illness.     All other systems reviewed and are negative.   Prior CV studies:   The following studies were reviewed today:  11/2017 cath  There is moderate left ventricular systolic dysfunction. The left ventricular ejection fraction is 35-45% by visual estimate.  LV end diastolic pressure is mildly elevated.  -----------------------------------------  ANGIOGRAPHY- PCI  LESION #1: Mid LAD lesion is 80% stenosed.  A drug-eluting stent was successfully placed using a STENT SYNERGY DES 2.25X16. -Postdilated to 2.4 mm  Post intervention, there is a 0% residual stenosis.  LESION #2: mid LAD to Dist LAD lesion is 90% stenosed.  A drug-eluting stent was  successfully placed using a STENT SYNERGY DES 2.25X12. -Postdilated to 2.4 mm  Post intervention, there is a 0% residual stenosis.  Prox LAD lesion is 40% stenosed -prior to lesion 1; apical, after lesion 2  A drug-eluting stent was successfully placed using a STENT SYNERGY DES 2.25X12. -Postdilated to 2.4 mm  -----------------------------------------  Post intervention, there is a 0% residual stenosis.  Trifurcation 90% lesion at the distal circumflex into LPL and PDA -small caliber distal vessels. Best treated medically as this is not a very good PCI target  Prox small caliber nondominant RCA lesion is 99% stenosed.  Severe distal OM 2 -DES PCI with Synergy 2.25 mm x16 mm (2.4 mm); severe trifurcation disease with a very distal AV groove circumflex prior to PDA and PL Prisca Gearing -medical management.  Subtotally occluded nondominant RCA Moderately reduced LVEF of roughly 40% with mildly elevated LVEDP.  Plan: Transfer to postprocedure unit for post PCI monitoring and TR removal Continue aggressive risk factor modification with statin and beta-blocker etc.  Recommend uninterrupted dual antiplatelet therapy with Aspirin  [name] and Ticagrelor  twice dailyfor a minimum of 12 months (ACS - Class I recommendation).-Would strongly consider reducing to 60 mg dose ticagrelor at 9-12 months and continue for an additional year.  Would be okay to stop aspirin after 3 months if necessary. Okay to stop Brilinta after 6 months for procedures if necessary.  Labs/Other Tests and Data Reviewed:    EKG:  No ECG reviewed.  Recent Labs: 01/05/2018: TSH 60.657 02/08/2018: ALT 15 02/26/2018: BUN 14; Creatinine, Ser 1.00; Hemoglobin 11.7; Platelets 300; Potassium 3.8; Sodium 139   Recent Lipid Panel Lab Results  Component Value Date/Time   CHOL 211 (H) 02/11/2018 06:15 AM   TRIG 391 (H) 02/11/2018 06:15 AM   HDL 25 (L) 02/11/2018 06:15 AM   CHOLHDL 8.4 02/11/2018 06:15 AM   LDLCALC 108  (H) 02/11/2018 06:15 AM    Wt Readings from Last 3 Encounters:  04/22/18 256 lb (116.1 kg)  03/22/18 265 lb (120.2 kg)  02/25/18 272 lb 0.8 oz (123.4 kg)     Objective:    Vital Signs:   Today's Vitals   07/26/18 0830  Weight: 269 lb (122 kg)  Height:  (1.981 m)   Body mass index is 31.09 kg/m.  Normal affect. Normal speech pattern and tone. Comfortable, no apparent distress. No audible signs of SOB or wheezing.   ASSESSMENT & PLAN:  1. CAD/ICM - recent stenting as reported above -some recent SOB/DOE, no chest pain. Multiple possible etiologies including deconditioning due to multiple admissions over the last 4 months including 2 strokes, ongoing severe hypothyroidism.  - had deferred repeat cardiac testing due to self pay status and fairly stable symptoms, he is awaiting disability. If approved would consider stress test at that time - will try changing brillinta to plavix due to his SOB. Reassess in 6 months. Start plavix 300mg  x 1 then 75mg  [name].     COVID-19 Education: The signs and symptoms of COVID-19 were discussed with the patient and how to seek care for testing (follow up with PCP or arrange E-visit).  The importance of social distancing was discussed today.  Time:   Today, I have spent 11 minutes with the patient with telehealth technology discussing the above problems.     Medication Adjustments/Labs and Tests Ordered: Current medicines are reviewed at length with the patient today.  Concerns regarding medicines are outlined above.   Tests Ordered: No orders of the defined types were placed in this encounter.   Medication Changes: No orders of the defined types were placed in this encounter.   Disposition:  Follow up 6 weeks  Signed, Dina Rich, MD  07/26/2018 8:10 AM    [facility]

## 2018-07-27 ENCOUNTER — Emergency Department (HOSPITAL_COMMUNITY): Payer: Self-pay

## 2018-07-27 ENCOUNTER — Observation Stay (HOSPITAL_COMMUNITY)
Admission: EM | Admit: 2018-07-27 | Discharge: 2018-07-28 | Disposition: A | Discharge status: Home / Self Care | Payer: Self-pay | Attending: Family Medicine | Admitting: Family Medicine

## 2018-07-27 ENCOUNTER — Other Ambulatory Visit: Payer: Self-pay

## 2018-07-27 ENCOUNTER — Encounter (HOSPITAL_COMMUNITY): Payer: Self-pay | Admitting: *Deleted

## 2018-07-27 DIAGNOSIS — Z7902 Long term (current) use of antithrombotics/antiplatelets: Secondary | ICD-10-CM | POA: Insufficient documentation

## 2018-07-27 DIAGNOSIS — Z79899 Other long term (current) drug therapy: Secondary | ICD-10-CM | POA: Insufficient documentation

## 2018-07-27 DIAGNOSIS — R112 Nausea with vomiting, unspecified: Secondary | ICD-10-CM

## 2018-07-27 DIAGNOSIS — E860 Dehydration: Secondary | ICD-10-CM | POA: Insufficient documentation

## 2018-07-27 DIAGNOSIS — E1159 Type 2 diabetes mellitus with other circulatory complications: Secondary | ICD-10-CM

## 2018-07-27 DIAGNOSIS — F329 Major depressive disorder, single episode, unspecified: Secondary | ICD-10-CM | POA: Insufficient documentation

## 2018-07-27 DIAGNOSIS — G450 Vertebro-basilar artery syndrome: Principal | ICD-10-CM | POA: Diagnosis present

## 2018-07-27 DIAGNOSIS — E785 Hyperlipidemia, unspecified: Secondary | ICD-10-CM | POA: Diagnosis present

## 2018-07-27 DIAGNOSIS — Z7984 Long term (current) use of oral hypoglycemic drugs: Secondary | ICD-10-CM | POA: Insufficient documentation

## 2018-07-27 DIAGNOSIS — Z7982 Long term (current) use of aspirin: Secondary | ICD-10-CM | POA: Insufficient documentation

## 2018-07-27 DIAGNOSIS — F17210 Nicotine dependence, cigarettes, uncomplicated: Secondary | ICD-10-CM | POA: Diagnosis present

## 2018-07-27 DIAGNOSIS — Z20828 Contact with and (suspected) exposure to other viral communicable diseases: Secondary | ICD-10-CM | POA: Insufficient documentation

## 2018-07-27 DIAGNOSIS — N189 Chronic kidney disease, unspecified: Secondary | ICD-10-CM | POA: Insufficient documentation

## 2018-07-27 DIAGNOSIS — E1122 Type 2 diabetes mellitus with diabetic chronic kidney disease: Secondary | ICD-10-CM | POA: Insufficient documentation

## 2018-07-27 DIAGNOSIS — E039 Hypothyroidism, unspecified: Secondary | ICD-10-CM | POA: Diagnosis present

## 2018-07-27 DIAGNOSIS — Z9049 Acquired absence of other specified parts of digestive tract: Secondary | ICD-10-CM | POA: Insufficient documentation

## 2018-07-27 DIAGNOSIS — E782 Mixed hyperlipidemia: Secondary | ICD-10-CM | POA: Diagnosis present

## 2018-07-27 DIAGNOSIS — J45909 Unspecified asthma, uncomplicated: Secondary | ICD-10-CM | POA: Insufficient documentation

## 2018-07-27 DIAGNOSIS — R42 Dizziness and giddiness: Secondary | ICD-10-CM

## 2018-07-27 DIAGNOSIS — Z720 Tobacco use: Secondary | ICD-10-CM | POA: Diagnosis present

## 2018-07-27 DIAGNOSIS — I130 Hypertensive heart and chronic kidney disease with heart failure and stage 1 through stage 4 chronic kidney disease, or unspecified chronic kidney disease: Secondary | ICD-10-CM | POA: Insufficient documentation

## 2018-07-27 DIAGNOSIS — Z8673 Personal history of transient ischemic attack (TIA), and cerebral infarction without residual deficits: Secondary | ICD-10-CM | POA: Insufficient documentation

## 2018-07-27 DIAGNOSIS — F17200 Nicotine dependence, unspecified, uncomplicated: Secondary | ICD-10-CM | POA: Diagnosis present

## 2018-07-27 DIAGNOSIS — I2510 Atherosclerotic heart disease of native coronary artery without angina pectoris: Secondary | ICD-10-CM | POA: Diagnosis present

## 2018-07-27 DIAGNOSIS — I5042 Chronic combined systolic (congestive) and diastolic (congestive) heart failure: Secondary | ICD-10-CM | POA: Diagnosis present

## 2018-07-27 DIAGNOSIS — I10 Essential (primary) hypertension: Secondary | ICD-10-CM | POA: Diagnosis present

## 2018-07-27 DIAGNOSIS — E119 Type 2 diabetes mellitus without complications: Secondary | ICD-10-CM

## 2018-07-27 DIAGNOSIS — R197 Diarrhea, unspecified: Secondary | ICD-10-CM | POA: Insufficient documentation

## 2018-07-27 LAB — COMPREHENSIVE METABOLIC PANEL
ALT: 40 U/L (ref 0–44)
AST: 30 U/L (ref 15–41)
Albumin: 4.5 g/dL (ref 3.5–5.0)
Alkaline Phosphatase: 91 U/L (ref 38–126)
Anion gap: 14 (ref 5–15)
BUN: 21 mg/dL — ABNORMAL HIGH (ref 6–20)
CO2: 23 mmol/L (ref 22–32)
Calcium: 10 mg/dL (ref 8.9–10.3)
Chloride: 102 mmol/L (ref 98–111)
Creatinine, Ser: 1.38 mg/dL — ABNORMAL HIGH (ref 0.61–1.24)
GFR calc Af Amer: >60 mL/min (ref >60)
GFR calc non Af Amer: >60 mL/min (ref >60)
Glucose, Bld: 242 mg/dL — ABNORMAL HIGH (ref 70–99)
Potassium: 4.6 mmol/L (ref 3.5–5.1)
Sodium: 139 mmol/L (ref 135–145)
Total Bilirubin: 1 mg/dL (ref 0.3–1.2)
Total Protein: 8.5 g/dL — ABNORMAL HIGH (ref 6.5–8.1)

## 2018-07-27 LAB — BLOOD GAS, VENOUS
Acid-base deficit: 1.4 mmol/L (ref 0.0–2.0)
Bicarbonate: 23 mmol/L (ref 20.0–28.0)
FIO2: 21
O2 Saturation: 79.5 %
Patient temperature: 37
pCO2, Ven: 36.9 mmHg — ABNORMAL LOW (ref 44.0–60.0)
pH, Ven: 7.405 (ref 7.250–7.430)
pO2, Ven: 44.2 mmHg (ref 32.0–45.0)

## 2018-07-27 LAB — CBC
HCT: 49.5 % (ref 39.0–52.0)
Hemoglobin: 16.2 g/dL (ref 13.0–17.0)
MCH: 28.2 pg (ref 26.0–34.0)
MCHC: 32.7 g/dL (ref 30.0–36.0)
MCV: 86.2 fL (ref 80.0–100.0)
Platelets: 342 10*3/uL (ref 150–400)
RBC: 5.74 MIL/uL (ref 4.22–5.81)
RDW: 15.1 % (ref 11.5–15.5)
WBC: 13.1 10*3/uL — ABNORMAL HIGH (ref 4.0–10.5)
nRBC: 0 % (ref 0.0–0.2)

## 2018-07-27 LAB — CBG MONITORING, ED: Glucose-Capillary: 252 mg/dL — ABNORMAL HIGH (ref 70–99)

## 2018-07-27 LAB — CK: Total CK: 86 U/L (ref 49–397)

## 2018-07-27 LAB — MAGNESIUM: Magnesium: 1.7 mg/dL (ref 1.7–2.4)

## 2018-07-27 LAB — TROPONIN I: Troponin I: <0.03 ng/mL (ref <0.03)

## 2018-07-27 LAB — LIPASE, BLOOD: Lipase: 26 U/L (ref 11–51)

## 2018-07-27 MED ORDER — LACTATED RINGERS IV BOLUS
1000.0000 mL | Freq: Once | INTRAVENOUS | Status: AC
  Administered 2018-07-27: 1000 mL via INTRAVENOUS

## 2018-07-27 MED ORDER — SODIUM CHLORIDE 0.9% FLUSH: 3.0000 mL | Freq: Once | INTRAVENOUS | Status: DC

## 2018-07-27 MED ORDER — IOHEXOL 350 MG/ML SOLN
100.0000 mL | Freq: Once | INTRAVENOUS | Status: AC | PRN
  Administered 2018-07-27: 100 mL via INTRAVENOUS

## 2018-07-27 NOTE — ED Notes (Signed)
Pt to ct 

## 2018-07-27 NOTE — ED Provider Notes (Signed)
[facility] EMERGENCY DEPARTMENT Provider Note   CSN: [id number] Arrival date & time: 07/27/18  2131    History   Chief Complaint Chief Complaint  Patient presents with  . Emesis  . Diarrhea    HPI Devin [name] is a 48 y.o. male.     HPI  47 year old male comes in a chief complaint of vomiting, diarrhea and dizziness.  Patient has history of CAD, diabetes, CHF, basilar insufficiency that required treatment by interventional radiology and acute stroke.  Patient states that for the last several days has been having nausea and vomiting.  He typically throws up about 5 times a day, usually provoked by p.o. intake.  He continues to have nausea even when he is not eating.  The vomitus has been projectile in nature.  He also reports about 3 loose bowel movements a day.  He has no associated abdominal pain.  Blood sugars have been well controlled.  Patient has no UTI-like symptoms.  Additionally he reports that he also has been having dizziness.  Typically the dizziness is provoked with him turning his head, exerting himself.  Dizziness is described as vertiginous in nature.  He does not recall the symptoms he had when he had the stroke.   Past Medical History:  Diagnosis Date  . Asthma   . Chest pain   . Coronary artery disease    a. s/p DES x2 to LAD and DES to OM in 11/2017  . Depression   . Dyspnea   . GERD (gastroesophageal reflux disease)   . Headache   . History of kidney stones   . History of rhabdomyolysis   . Hyperlipidemia   . Hypertension   . Hypothyroidism   . Ischemic cardiomyopathy    a. 11/2017: echo showing EF of 35-40%, diffuse HK, and Grade 2 DD  . Kidney calculus 2014  . Pericardial effusion    Small, by dobutamine echocardiogram, 04/2006  . PONV (postoperative nausea and vomiting)   . Sleep apnea    does not wear cpap  . Stroke ([place])   . Tobacco abuse   . Type 2 diabetes mellitus without complications ([place]) 8/46/9629  . Vitamin D deficiency      Patient Active Problem List   Diagnosis Date Noted  . 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 Acute cerebrovascular accident (CVA) ([place]) 02/10/2018  . Chronic combined systolic and diastolic CHF (congestive heart failure) ([place]) 01/06/2018  . Altered mental status 01/05/2018  . History of stroke 01/05/2018  . 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 Cerebellar stroke ([place])   . TIA (transient ischemic attack) 11/30/2017  . Abnormal cardiac CT angiography   . Coronary artery disease   . Chest pain due to CAD ([place]) 11/14/2017  . Chest pain with moderate risk for cardiac etiology   . CKD (chronic kidney disease) 11/13/2017  . Hypothyroidism 10/07/2013  . Type 2 diabetes mellitus without complications ([place]) 52/84/1324  . Acute renal failure ([place]) 10/06/2013  . Elevated AST (SGOT) 10/06/2013  . Elevated ALT measurement 10/06/2013  . Chest pain   . Tobacco abuse   . Hypertension   . Hyperlipidemia   . Pericardial effusion   . History of rhabdomyolysis     Past Surgical History:  Procedure Laterality Date  . APPENDECTOMY    . 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 CHOLECYSTECTOMY N/A 01/27/2013   Procedure: LAPAROSCOPIC CHOLECYSTECTOMY;  Surgeon: Jamesetta So, MD;  Location: [facility];  Service: General;  Laterality: N/A;  . CORONARY STENT INTERVENTION N/A 11/15/2017   Procedure: CORONARY STENT INTERVENTION;  Surgeon: Leonie Man, MD;  Location: [place] CV LAB;  Service: Cardiovascular;  Laterality: N/A;  . HERNIA REPAIR     As a child  . INSERTION OF MESH N/A 11/13/2012   Procedure: INSERTION OF MESH;  Surgeon: Jamesetta So, MD;  Location: [facility];  Service: General;  Laterality: N/A;  . IR ANGIO INTRA EXTRACRAN SEL COM CAROTID INNOMINATE BILAT MOD SED  12/01/2017  . IR ANGIO INTRA EXTRACRAN SEL COM CAROTID INNOMINATE BILAT MOD SED  02/13/2018  . IR ANGIO  VERTEBRAL SEL SUBCLAVIAN INNOMINATE UNI L MOD SED  02/13/2018  . IR ANGIO VERTEBRAL SEL VERTEBRAL BILAT MOD SED  12/01/2017  . IR ANGIO VERTEBRAL SEL VERTEBRAL UNI R MOD SED  02/13/2018  . IR INTRA CRAN STENT  12/03/2017  . IR PTA INTRACRANIAL  02/25/2018  . IR US GUIDE VASC ACCESS RIGHT  02/13/2018  . KIDNEY STONE SURGERY    . LEFT HEART CATH AND CORONARY ANGIOGRAPHY N/A 11/15/2017   Procedure: LEFT HEART CATH AND CORONARY ANGIOGRAPHY;  Surgeon: Leonie Man, MD;  Location: [place] CV LAB;  Service: Cardiovascular;  Laterality: N/A;  . PERCUTANEOUS NEPHROLITHOTRIPSY    . RADIOLOGY WITH ANESTHESIA Left 12/03/2017   Procedure: Angioplasty with possible stenting of left VBJ;  Surgeon: Luanne Bras, MD;  Location: [place];  Service: Radiology;  Laterality: Left;  . RADIOLOGY WITH ANESTHESIA N/A 02/25/2018   Procedure: STENT PLACEMENT;  Surgeon: Luanne Bras, MD;  Location: [place];  Service: Radiology;  Laterality: N/A;  . UMBILICAL HERNIA REPAIR N/A 11/13/2012   Procedure: UMBILICAL HERNIORRHAPHY;  Surgeon: Jamesetta So, MD;  Location: [facility];  Service: General;  Laterality: N/A;  . VARICOCELECTOMY          Home Medications    Prior to Admission medications   Medication Sig Start Date End Date Taking? Authorizing Provider  aspirin 81 MG EC tablet Take 1 tablet (81 mg total) by mouth daily. With food 12/05/17   Roxan Hockey, MD  Blood Glucose Monitoring Suppl w/Device KIT Dispense based on patient and insurance preference. Check blood sugar daily before breakfast (ICD9 250.0) 04/29/17   Pollina, Gwenyth Allegra, MD  citalopram (CELEXA) 10 MG tablet Take 1 tablet (10 mg total) by mouth daily. 01/04/18   Venancio Poisson, NP  clopidogrel (PLAVIX) 75 MG tablet Take 300 mg (4 tablets) on day 1 ONLY. Then take 75 mg (1 tablet) daily. 07/26/18   Arnoldo Lenis, MD  EUTHYROX 200 MCG tablet Take 200 mcg by mouth daily. 07/20/18   [provider]  EUTHYROX 50 MCG tablet Take  50 mcg by mouth daily. 07/20/18   [provider]  ezetimibe (ZETIA) 10 MG tablet Take 1 tablet (10 mg total) by mouth daily. 12/06/17   Roxan Hockey, MD  fenofibrate 160 MG tablet Take 1 tablet (160 mg total) by mouth daily. 02/13/18   Rai, Vernelle Emerald, MD  gabapentin (NEURONTIN) 300 MG capsule Take 1 capsule (300 mg total) by mouth 2 (two) times daily. 01/04/18   Venancio Poisson, NP  glimepiride (AMARYL) 2 MG tablet Take 1 tablet (2 mg total) by mouth daily with breakfast. 11/16/17   Patrecia Pour, MD  losartan (COZAAR) 25 MG tablet Take 0.5 tablets (12.5 mg total) by mouth daily. 01/15/18   Erma Heritage, PA-C  nitroGLYCERIN (NITROSTAT) 0.4 MG SL tablet Place 1 tablet (0.4 mg total) under the tongue every 5 (five) minutes as needed for chest pain. 01/15/18   Strader, Fransisco Hertz, PA-C  pantoprazole (PROTONIX) 40 MG tablet Take 40 mg by mouth every morning.    [provider]    Family History Family History  Problem Relation Age of Onset  . Coronary artery disease Father   . Hypertension Father   . Hyperlipidemia Father   . Diabetes Father   . Congestive Heart Failure Father 83  . Arrhythmia Father        had an ICD  . Diabetes Mother   . Hypertension Mother   . Hyperlipidemia Mother   . Obesity Mother 56       died after bariatric surgery, liver failure and infection  . Coronary artery disease Maternal Grandfather        both grandfathers and several uncles  . Coronary artery disease Other   . Diabetes Sister        both sisters  . Stroke Maternal Aunt   . Stroke Maternal Uncle     Social History Social History   Tobacco Use  . Smoking status: Current Some Day Smoker    Packs/day: 0.25    Years: 26.00    Pack years: 6.50    Types: Cigarettes    Start date: 03/13/1985  . Smokeless tobacco: Former User    Types: Snuff, Sarina Ser    Quit date: 03/14/1987  Substance Use Topics  . Alcohol use: No  . Drug use: No     Allergies   Crestor [rosuvastatin]  and Bee venom   Review of Systems Review of Systems  Constitutional: Positive for activity change.  Respiratory: Negative for shortness of breath.   Cardiovascular: Negative for chest pain.  Gastrointestinal: Positive for diarrhea, nausea and vomiting. Negative for abdominal pain.  Genitourinary: Negative for dysuria.  Neurological: Positive for dizziness.  Hematological: Does not bruise/bleed easily.  All other systems reviewed and are negative.    Physical Exam Updated Vital Signs BP 114/73 (BP Location: Right Arm)   Pulse (!) 102   Temp 98 F (36.7 C) (Oral)   Resp 14   Ht 6' 6"  (1.981 m)   Wt 122.5 kg   SpO2 98%   BMI 31.20 kg/m   Physical Exam Vitals signs and nursing note reviewed.  Constitutional:      Appearance: He is well-developed.  HENT:     Head: Normocephalic and atraumatic.  Eyes:     Extraocular Movements: Extraocular movements intact.     Conjunctiva/sclera: Conjunctivae normal.     Pupils: Pupils are equal, round, and reactive to light.     Comments: Hints exam did not reveal any skew or nystagmus.  Neck:     Musculoskeletal: Normal range of motion and neck supple.  Cardiovascular:     Rate and Rhythm: Normal rate and regular rhythm.     Heart sounds: Normal heart sounds.  Pulmonary:     Effort: Pulmonary effort is normal. No respiratory distress.     Breath sounds: Normal breath sounds. No wheezing.  Abdominal:     General: Bowel sounds are normal. There is no distension.     Palpations: Abdomen is soft.     Tenderness: There is no abdominal tenderness. There is no guarding or rebound.  Skin:    General: Skin is warm.  Neurological:     Mental Status: He is alert and oriented to person, place,  and time.     Comments: Cerebellar exam is normal (finger to nose) Sensory exam normal for bilateral upper and lower extremities - and patient is able to discriminate between sharp and dull. Motor exam is 4+/5       ED Treatments / Results   Labs (all labs ordered are listed, but only abnormal results are displayed) Labs Reviewed  CBC - Abnormal; Notable for the following components:      Result Value   WBC 13.1 (*)    All other components within normal limits  BLOOD GAS, VENOUS - Abnormal; Notable for the following components:   pCO2, Ven 36.9 (*)    All other components within normal limits  CBG MONITORING, ED - Abnormal; Notable for the following components:   Glucose-Capillary 252 (*)    All other components within normal limits  LIPASE, BLOOD  COMPREHENSIVE METABOLIC PANEL  URINALYSIS, ROUTINE W REFLEX MICROSCOPIC  MAGNESIUM  TROPONIN I  CK    EKG None  Radiology No results found.  Procedures Procedures (including critical care time)  Medications Ordered in ED Medications  sodium chloride flush (NS) 0.9 % injection 3 mL (3 mLs Intravenous Not Given 07/27/18 2235)  lactated ringers bolus 1,000 mL (1,000 mLs Intravenous New Bag/Given 07/27/18 2235)     Initial Impression / Assessment and Plan / ED Course  I have reviewed the triage vital signs and the nursing notes.  Pertinent labs & imaging results that were available during my care of the patient were reviewed by me and considered in my medical decision making (see chart for details).        48 year old male with history of diabetes, CAD, trivial basal insufficiency that required IR to stent comes in with chief complaint of nausea, vomiting, diarrhea and dizziness.  He has no abdominal pain and he denies any recent antibiotics or travel history.  Abdominal exam is benign.  He reports that his emesis is more projectile in nature. Given that there is no abdominal pain, I doubt that he is small obstruction or ileus. We will order basic labs for further evaluation of the complications from the nausea, vomiting and diarrhea.  Does not appear to be underlying abdominal obstruction or infection.  We will need to rule out UTI and DKA.  Patient has  vertiginous symptoms that are intermittent and reproducible with movement and turning his head.  On exam hints is not suggestive of any particular cause for his vertigo.  Given his history of VBI, however he will need CT angiogram for further assessment of his vertebrobasilar system.  If the CT scan is negative then we will admit him for MRI.  Dr. Wyvonnia Dusky or will follow-up on the results. I discussed the case with Dr. Lorraine Lax, neuro hospitalist and he concurs with the plan.  Final Clinical Impressions(s) / ED Diagnoses   Final diagnoses:  Nausea vomiting and diarrhea  VBI (vertebrobasilar insufficiency)  Vertigo    ED Discharge Orders    None       Varney Biles, MD 07/27/18 2307

## 2018-07-27 NOTE — ED Triage Notes (Signed)
Pt with emesis and diarrhea for a week, "dizzy spells" off and on for a week, mild dizziness at present.

## 2018-07-27 NOTE — ED Notes (Signed)
Pt given urinal and was informed of need for specimen.

## 2018-07-27 NOTE — ED Provider Notes (Signed)
Patient with history of CAD, diabetes, basilar insufficiency requiring stenting previous strokes presenting with vomiting and dizziness.  There is concern for recurrent VBI and possible new infarct. He is awaiting CT angiogram of head and neck.  Labs show no evidence of DKA.  CTA is unchanged without large vessel occlusion.  The left vertebral basilar stent is patent.  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  On recheck patient has a soft abdomen.  5/5 strength throughout, no nystagmus, no ataxia in finger-to-nose,  Agrees with plan for observation admission to obtain MRI in the morning. Discussed with Dr. Robb Matar.   Glynn Octave, MD 07/28/18 [phone]

## 2018-07-28 ENCOUNTER — Encounter (HOSPITAL_COMMUNITY): Payer: Self-pay | Admitting: Internal Medicine

## 2018-07-28 ENCOUNTER — Observation Stay (HOSPITAL_COMMUNITY): Payer: Self-pay

## 2018-07-28 ENCOUNTER — Emergency Department (HOSPITAL_COMMUNITY): Payer: Self-pay

## 2018-07-28 DIAGNOSIS — G450 Vertebro-basilar artery syndrome: Secondary | ICD-10-CM

## 2018-07-28 DIAGNOSIS — I5042 Chronic combined systolic (congestive) and diastolic (congestive) heart failure: Secondary | ICD-10-CM

## 2018-07-28 LAB — GLUCOSE, CAPILLARY: Glucose-Capillary: 144 mg/dL — ABNORMAL HIGH (ref 70–99)

## 2018-07-28 LAB — URINALYSIS, ROUTINE W REFLEX MICROSCOPIC
Bilirubin Urine: NEGATIVE
Glucose, UA: NEGATIVE mg/dL
Hgb urine dipstick: NEGATIVE
Ketones, ur: NEGATIVE mg/dL
Leukocytes,Ua: NEGATIVE
Nitrite: NEGATIVE
Protein, ur: NEGATIVE mg/dL
Specific Gravity, Urine: >1.046 — ABNORMAL HIGH (ref 1.005–1.030)
pH: 5 (ref 5.0–8.0)

## 2018-07-28 LAB — SARS CORONAVIRUS 2 BY RT PCR (HOSPITAL ORDER, PERFORMED IN ~~LOC~~ HOSPITAL LAB): SARS Coronavirus 2: NEGATIVE

## 2018-07-28 MED ORDER — ENOXAPARIN SODIUM 60 MG/0.6ML ~~LOC~~ SOLN: 60.0000 mg | SUBCUTANEOUS | Status: DC

## 2018-07-28 MED ORDER — ASPIRIN EC 81 MG PO TBEC
81.0000 mg | DELAYED_RELEASE_TABLET | Freq: Every day | ORAL | Status: DC
  Administered 2018-07-28: 81 mg via ORAL
  Filled 2018-07-28: qty 1

## 2018-07-28 MED ORDER — TICAGRELOR 90 MG PO TABS: 90.0000 mg | ORAL_TABLET | Freq: Two times a day (BID) | ORAL | Status: DC

## 2018-07-28 MED ORDER — ONDANSETRON HCL 4 MG PO TABS: 4.0000 mg | ORAL_TABLET | Freq: Four times a day (QID) | ORAL | Status: DC | PRN

## 2018-07-28 MED ORDER — SODIUM CHLORIDE 0.9 % IV SOLN: INTRAVENOUS | Status: DC

## 2018-07-28 MED ORDER — ACETAMINOPHEN 325 MG PO TABS: 650.0000 mg | ORAL_TABLET | ORAL | Status: DC | PRN

## 2018-07-28 MED ORDER — SODIUM CHLORIDE 0.9 % IV SOLN
INTRAVENOUS | Status: DC
  Administered 2018-07-28: 06:00:00 via INTRAVENOUS

## 2018-07-28 MED ORDER — ENOXAPARIN SODIUM 40 MG/0.4ML ~~LOC~~ SOLN: 40.0000 mg | SUBCUTANEOUS | Status: DC

## 2018-07-28 MED ORDER — CLOPIDOGREL BISULFATE 75 MG PO TABS
75.0000 mg | ORAL_TABLET | Freq: Every day | ORAL | Status: DC
  Administered 2018-07-28: 75 mg via ORAL
  Filled 2018-07-28: qty 1

## 2018-07-28 MED ORDER — ACETAMINOPHEN 650 MG RE SUPP: 650.0000 mg | RECTAL | Status: DC | PRN

## 2018-07-28 MED ORDER — STROKE: EARLY STAGES OF RECOVERY BOOK
Freq: Once | Status: DC
  Filled 2018-07-28 (×2): qty 1

## 2018-07-28 MED ORDER — ACETAMINOPHEN 160 MG/5ML PO SOLN: 650.0000 mg | ORAL | Status: DC | PRN

## 2018-07-28 MED ORDER — ONDANSETRON HCL 4 MG/2ML IJ SOLN: 4.0000 mg | Freq: Four times a day (QID) | INTRAMUSCULAR | Status: DC | PRN

## 2018-07-28 MED ORDER — MAGNESIUM SULFATE 2 GM/50ML IV SOLN
2.0000 g | Freq: Once | INTRAVENOUS | Status: AC
  Administered 2018-07-28: 2 g via INTRAVENOUS
  Filled 2018-07-28: qty 50

## 2018-07-28 NOTE — ED Notes (Signed)
Pt returned from xr

## 2018-07-28 NOTE — Progress Notes (Signed)
Patient being discharged home.  Patient to be transported by family.  IV removed with the catheter intact. Discharge instructions and prescription information given with the patient verbalizing understanding. 

## 2018-07-28 NOTE — Progress Notes (Signed)
PT Cancellation Note  Patient Details Name: Devin [name] MRN: [id number] DOB: May 28, 1970   Cancelled Treatment:    Reason Eval/Treat Not Completed: Patient at procedure or test/unavailable   Fabio Asa 07/28/2018, 7:19 AM

## 2018-07-28 NOTE — Progress Notes (Signed)
Spoke to Pharmacy regarding Brilinta.  Pharmacy will reschedule due to patient already receivin aspirin and plavix today.

## 2018-07-28 NOTE — Evaluation (Signed)
Physical Therapy Evaluation Patient Details Name: Devin [name] MRN: [id number] DOB: [birth year] Today's Date: 07/28/2018   History of Present Illness  48 y.o. male with medical history significant of asthma, CAD, history of pericardial effusion, ischemic cardiomyopathy, depression, GERD, headaches, history of urolithiasis, history of rhabdomyolysis, hyperlipidemia, hypertension, hypothyroidism, sleep apnea not on CPAP, tobacco abuse, type 2 diabetes, previous strokes and known VBI requiring stenting. On 5/16 he came to the ER at [facility] with complaints of nausea, vomiting and diarrhea x1 week.  Clinical Impression  Patient seen for therapy assessment.  Mobilizing fairly well with assistive device and modest deficits at this time. Educated patient on BEFAST signs. While patient with some instability, patient reports that this is close to his mobility baseline and declines further needs. No further acute PT needs. Will sign off.  Follow Up Recommendations No PT follow up    Equipment Recommendations  None recommended by PT    Recommendations for Other Services       Precautions / Restrictions Precautions Precautions: Fall      Mobility  Bed Mobility Overal bed mobility: Independent                Transfers Overall transfer level: Modified independent Equipment used: Straight cane             General transfer comment: no physical assist required  Ambulation/Gait Ambulation/Gait assistance: Supervision Gait Distance (Feet): 310 Feet Assistive device: Straight cane Gait Pattern/deviations: Step-through pattern;Decreased stride length;Antalgic Gait velocity: decreased Gait velocity interpretation: <1.8 ft/sec, indicate of risk for recurrent falls General Gait Details: steady with mobility and use of cane  Stairs Stairs: Yes Stairs assistance: Supervision Stair Management: One rail Right Number of Stairs: 4 General stair comments: one episode of instability during  descent performed x2  Wheelchair Mobility    Modified Rankin (Stroke Patients Only) Modified Rankin (Stroke Patients Only) Pre-Morbid Rankin Score: No symptoms Modified Rankin: Moderate disability     Balance Overall balance assessment: Needs assistance   Sitting balance-Leahy Scale: Good     Standing balance support: During functional activity Standing balance-Leahy Scale: Fair Standing balance comment: use of cane for external assist                             Pertinent Vitals/Pain Pain Assessment: No/denies pain    Home Living Family/patient expects to be discharged to:: Private residence Living Arrangements: Alone Available Help at Discharge: Family;Friend(s);Available PRN/intermittently Type of Home: Mobile home Home Access: Stairs to enter Entrance Stairs-Rails: Right Entrance Stairs-Number of Steps: 4 Home Layout: One level Home Equipment: Walker - 2 wheels;Cane - single point;Crutches;Bedside commode      Prior Function Level of Independence: Independent with assistive device(s)         Comments: uses cane for mobility, +driving, - workingg      Hand Dominance   Dominant Hand: Left    Extremity/Trunk Assessment   Upper Extremity Assessment Upper Extremity Assessment: Defer to OT evaluation    Lower Extremity Assessment Lower Extremity Assessment: Generalized weakness(history of arthritic pain)       Communication   Communication: No difficulties  Cognition Arousal/Alertness: Awake/alert Behavior During Therapy: WFL for tasks assessed/performed Overall Cognitive Status: Within Functional Limits for tasks assessed  General Comments      Exercises     Assessment/Plan    PT Assessment Patent does not need any further PT services  PT Problem List         PT Treatment Interventions      PT Goals (Current goals can be found in the Care Plan section)  Acute Rehab PT  Goals PT Goal Formulation: All assessment and education complete, DC therapy    Frequency     Barriers to discharge        Co-evaluation               AM-PAC PT "6 Clicks" Mobility  Outcome Measure Help needed turning from your back to your side while in a flat bed without using bedrails?: None Help needed moving from lying on your back to sitting on the side of a flat bed without using bedrails?: None Help needed moving to and from a bed to a chair (including a wheelchair)?: None Help needed standing up from a chair using your arms (e.g., wheelchair or bedside chair)?: None Help needed to walk in hospital room?: A Little Help needed climbing 3-5 steps with a railing? : A Little 6 Click Score: 22    End of Session Equipment Utilized During Treatment: Gait belt Activity Tolerance: Patient tolerated treatment well Patient left: in bed;with call bell/phone within reach;with bed alarm set Nurse Communication: Mobility status PT Visit Diagnosis: Other symptoms and signs involving the nervous system (R29.898)    Time: 1255-1315 PT Time Calculation (min) (ACUTE ONLY): 20 min   Charges:   PT Evaluation $PT Eval Moderate Complexity: 1 Mod          Devin [name], PT DPT  Board Certified Neurologic Specialist Acute Rehabilitation Services Pager [phone] Office [phone]   Devin [name] 07/28/2018, 2:41 PM

## 2018-07-28 NOTE — Progress Notes (Signed)
Patient admitted from [facility] to [facility]  with the diagnosis of vertebral basilar insufficiency. A & o x 4. Denied any acute pain at this time. Assessment to epic completed. Patient voiced any concern at this time. Will continue to monitor.

## 2018-07-28 NOTE — ED Notes (Signed)
hospitalist in room  

## 2018-07-28 NOTE — Care Management (Signed)
Spoke w patient at bedside. He states that he has been on Brilinta and currently has a 3 month supply at home that he gets through the [facility]. He will continue follow up care there. No other CM needs identified.

## 2018-07-28 NOTE — Plan of Care (Signed)
Progressing towards goals

## 2018-07-28 NOTE — H&P (Signed)
History and Physical    Devin [name] TZG:[id number] DOB: 1970-04-06 DOA: 07/27/2018  PCP: [facility] [place]   Patient coming from: Home.  I have personally briefly reviewed patient's old medical records in [place]  Chief Complaint: Nausea, vomiting and diarrhea.  HPI: Devin [name] is a 48 y.o. male with medical history significant of asthma, CAD, history of pericardial effusion, ischemic cardiomyopathy, depression, GERD, headaches, history of urolithiasis, history of rhabdomyolysis, hyperlipidemia, hypertension, hypothyroidism, sleep apnea not on CPAP, tobacco abuse, type 2 diabetes, vitamin D deficiency who is coming to the emergency department with complaints of a week history of nausea, vomiting and diarrhea.  He denies abdominal pain, but states his abdominal muscles feel sore from vomiting.  No melena, hematochezia, flank pain, dysuria, frequency or hematuria.  He has also been getting "dizzy spells" often at home.  He denies headache, blurred vision, vertigo sensation, slurred speech or focal weakness of extremities.  He denies chest pain, palpitations, diaphoresis, PND, orthopnea or pitting edema of the lower extremities.  He denies fever, chills, sore throat, rhinorrhea, wheezing, hemoptysis.  Denies polyuria, polydipsia, polyphagia or blurred vision.  ED Course: Initial vital signs temperature 98 F, pulse 102, respirations 14, blood pressure 114/73 mmHg and O2 sat 98% on room air.  The patient received a 1000 mL LR bolus in the ED.  CBC showed a white count of 13.1, hemoglobin 16.2 g/dL and platelets 342.  Venous blood gas showed a decreased PCO2 at 36.9 mmHg, was otherwise normal.  CMP shows a glucose of 242, BUN of 21 and creatinine 1.38 mg/dL.  Total protein was 8.5 g/dL, the rest of the LFTs were normal.  Lipase, magnesium, total CK, troponin and SARS coronavirus 2 swab were negative.  Review of Systems: As per HPI otherwise 10 point review of systems  negative.   Past Medical History:  Diagnosis Date  . Asthma   . Chest pain   . Coronary artery disease    a. s/p DES x2 to LAD and DES to OM in 11/2017  . Depression   . Dyspnea   . GERD (gastroesophageal reflux disease)   . Headache   . History of kidney stones   . History of rhabdomyolysis   . Hyperlipidemia   . Hypertension   . Hypothyroidism   . Ischemic cardiomyopathy    a. 11/2017: echo showing EF of 35-40%, diffuse HK, and Grade 2 DD  . Kidney calculus 2014  . Pericardial effusion    Small, by dobutamine echocardiogram, 04/2006  . PONV (postoperative nausea and vomiting)   . Sleep apnea    does not wear cpap  . Stroke ([place])   . Tobacco abuse   . Type 2 diabetes mellitus without complications ([place]) 7/59/1638  . Vitamin D deficiency     Past Surgical History:  Procedure Laterality Date  . APPENDECTOMY    . 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 CHOLECYSTECTOMY N/A 01/27/2013   Procedure: LAPAROSCOPIC CHOLECYSTECTOMY;  Surgeon: Jamesetta So, MD;  Location: [facility];  Service: General;  Laterality: N/A;  . CORONARY STENT INTERVENTION N/A 11/15/2017   Procedure: CORONARY STENT INTERVENTION;  Surgeon: Leonie Man, MD;  Location: [facility];  Service: Cardiovascular;  Laterality: N/A;  . HERNIA REPAIR     As a child  . INSERTION OF MESH N/A 11/13/2012   Procedure: INSERTION OF MESH;  Surgeon: Elta Guadeloupe  Lowella Petties, MD;  Location: [facility];  Service: General;  Laterality: N/A;  . IR ANGIO INTRA EXTRACRAN SEL COM CAROTID INNOMINATE BILAT MOD SED  12/01/2017  . IR ANGIO INTRA EXTRACRAN SEL COM CAROTID INNOMINATE BILAT MOD SED  02/13/2018  . IR ANGIO VERTEBRAL SEL SUBCLAVIAN INNOMINATE UNI L MOD SED  02/13/2018  . IR ANGIO VERTEBRAL SEL VERTEBRAL BILAT MOD SED  12/01/2017  . IR ANGIO VERTEBRAL SEL VERTEBRAL UNI R MOD SED  02/13/2018  . IR INTRA CRAN STENT  12/03/2017  . IR PTA INTRACRANIAL   02/25/2018  . IR US GUIDE VASC ACCESS RIGHT  02/13/2018  . KIDNEY STONE SURGERY    . LEFT HEART CATH AND CORONARY ANGIOGRAPHY N/A 11/15/2017   Procedure: LEFT HEART CATH AND CORONARY ANGIOGRAPHY;  Surgeon: Leonie Man, MD;  Location: [facility];  Service: Cardiovascular;  Laterality: N/A;  . PERCUTANEOUS NEPHROLITHOTRIPSY    . RADIOLOGY WITH ANESTHESIA Left 12/03/2017   Procedure: Angioplasty with possible stenting of left VBJ;  Surgeon: Luanne Bras, MD;  Location: [facility];  Service: Radiology;  Laterality: Left;  . RADIOLOGY WITH ANESTHESIA N/A 02/25/2018   Procedure: STENT PLACEMENT;  Surgeon: Luanne Bras, MD;  Location: [facility];  Service: Radiology;  Laterality: N/A;  . UMBILICAL HERNIA REPAIR N/A 11/13/2012   Procedure: UMBILICAL HERNIORRHAPHY;  Surgeon: Jamesetta So, MD;  Location: [facility];  Service: General;  Laterality: N/A;  . VARICOCELECTOMY       reports that he has been smoking cigarettes. He started smoking about 33 years ago. He has a 6.50 pack-year smoking history. He quit smokeless tobacco use about 31 years ago.  His smokeless tobacco use included snuff and chew. He reports that he does not drink alcohol or use drugs.  Allergies  Allergen Reactions  . Crestor [Rosuvastatin] Other (See Comments)    rhabdomyolosis  . Bee Venom Swelling    SWELLING REACTION UNSPECIFIED     Family History  Problem Relation Age of Onset  . Coronary artery disease Father   . Hypertension Father   . Hyperlipidemia Father   . Diabetes Father   . Congestive Heart Failure Father 30  . Arrhythmia Father        had an ICD  . Diabetes Mother   . Hypertension Mother   . Hyperlipidemia Mother   . Obesity Mother 55       died after bariatric surgery, liver failure and infection  . Coronary artery disease Maternal Grandfather        both grandfathers and several uncles  . Coronary artery disease Other   . Diabetes Sister        both sisters  . Stroke Maternal Aunt   .  Stroke Maternal Uncle    Prior to Admission medications   Medication Sig Start Date End Date Taking? Authorizing Provider  aspirin 81 MG EC tablet Take 1 tablet (81 mg total) by mouth daily. With food 12/05/17  Yes Roxan Hockey, MD  Blood Glucose Monitoring Suppl w/Device KIT Dispense based on patient and insurance preference. Check blood sugar daily before breakfast (ICD9 250.0) 04/29/17  Yes Pollina, Gwenyth Allegra, MD  citalopram (CELEXA) 10 MG tablet Take 1 tablet (10 mg total) by mouth daily. 01/04/18  Yes Vanschaick, Janett Billow, NP  EUTHYROX 200 MCG tablet Take 200 mcg by mouth daily. 07/20/18  Yes [provider]  EUTHYROX 50 MCG tablet Take 50 mcg by mouth daily. 07/20/18  Yes [provider]  ezetimibe (ZETIA) 10  MG tablet Take 1 tablet (10 mg total) by mouth daily. 12/06/17  Yes Emokpae, Courage, MD  fenofibrate 160 MG tablet Take 1 tablet (160 mg total) by mouth daily. 02/13/18  Yes Rai, Ripudeep K, MD  gabapentin (NEURONTIN) 300 MG capsule Take 1 capsule (300 mg total) by mouth 2 (two) times daily. 01/04/18  Yes Venancio Poisson, NP  glimepiride (AMARYL) 2 MG tablet Take 1 tablet (2 mg total) by mouth daily with breakfast. 11/16/17  Yes Patrecia Pour, MD  losartan (COZAAR) 25 MG tablet Take 0.5 tablets (12.5 mg total) by mouth daily. 01/15/18  Yes Strader, [place], PA-C  pantoprazole (PROTONIX) 40 MG tablet Take 40 mg by mouth every morning.   Yes [provider]  clopidogrel (PLAVIX) 75 MG tablet Take 300 mg (4 tablets) on day 1 ONLY. Then take 75 mg (1 tablet) daily. 07/26/18   Arnoldo Lenis, MD  nitroGLYCERIN (NITROSTAT) 0.4 MG SL tablet Place 1 tablet (0.4 mg total) under the tongue every 5 (five) minutes as needed for chest pain. 01/15/18   Erma Heritage, PA-C    Physical Exam: Vitals:   07/27/18 2150 07/28/18 0127 07/28/18 0243 07/28/18 0246  BP: 114/73 131/82 126/76   Pulse: (!) 102 83    Resp: 14 18 (!) 24   Temp: 98 F (36.7 C)      TempSrc: Oral     SpO2: 98% 96%  100%  Weight:      Height:        Constitutional: Looks ill, but currently in NAD, calm, comfortable Eyes: PERRL, lids and conjunctivae normal ENMT: Mucous membranes are dry. Posterior pharynx clear of any exudate or lesions. Neck: Normal, supple, no masses, no thyromegaly Respiratory: clear to auscultation bilaterally, no wheezing, no crackles. Normal respiratory effort. No accessory muscle use.  Cardiovascular: Regular rate and rhythm, no murmurs / rubs / gallops. No extremity edema. 2+ pedal pulses. No carotid bruits.  Abdomen: Obese, soft, mild epigastric tenderness, no guarding or rebound, masses palpated. No hepatosplenomegaly. Bowel sounds positive.  Musculoskeletal: no clubbing / cyanosis. Good ROM, no contractures. Normal muscle tone.  Skin: no rashes, lesions, ulcers on limited dermatological examination. Neurologic: CN 2-12 grossly intact. Sensation intact, DTR normal. Strength 5/5 in all 4.  Psychiatric: Normal judgment and insight. Alert and oriented x 3. Normal mood.   Labs on Admission: I have personally reviewed following labs and imaging studies  CBC: Recent Labs  Lab 07/27/18 2225  WBC 13.1*  HGB 16.2  HCT 49.5  MCV 86.2  PLT 575   Basic Metabolic Panel: Recent Labs  Lab 07/27/18 2225  NA 139  K 4.6  CL 102  CO2 23  GLUCOSE 242*  BUN 21*  CREATININE 1.38*  CALCIUM 10.0  MG 1.7   GFR: Estimated Creatinine Clearance: 98.2 mL/min (A) (by C-G formula based on SCr of 1.38 mg/dL (H)). Liver Function Tests: Recent Labs  Lab 07/27/18 2225  AST 30  ALT 40  ALKPHOS 91  BILITOT 1.0  PROT 8.5*  ALBUMIN 4.5   Recent Labs  Lab 07/27/18 2225  LIPASE 26   No results for input(s): AMMONIA in the last 168 hours. Coagulation Profile: No results for input(s): INR, PROTIME in the last 168 hours. Cardiac Enzymes: Recent Labs  Lab 07/27/18 2226  CKTOTAL 86  TROPONINI <0.03   BNP (last 3 results) No results for  input(s): PROBNP in the last 8760 hours. HbA1C: No results for input(s): HGBA1C in the last 72 hours.  CBG: Recent Labs  Lab 07/27/18 2221  GLUCAP 252*   Lipid Profile: No results for input(s): CHOL, HDL, LDLCALC, TRIG, CHOLHDL, LDLDIRECT in the last 72 hours. Thyroid Function Tests: No results for input(s): TSH, T4TOTAL, FREET4, T3FREE, THYROIDAB in the last 72 hours. Anemia Panel: No results for input(s): VITAMINB12, FOLATE, FERRITIN, TIBC, IRON, RETICCTPCT in the last 72 hours. Urine analysis:    Component Value Date/Time   COLORURINE YELLOW 02/10/2018 1929   APPEARANCEUR CLEAR 02/10/2018 1929   LABSPEC 1.020 02/10/2018 1929   PHURINE 5.0 02/10/2018 [place] NEGATIVE 02/10/2018 1929   HGBUR NEGATIVE 02/10/2018 [place] NEGATIVE 02/10/2018 1929   KETONESUR NEGATIVE 02/10/2018 1929   PROTEINUR NEGATIVE 02/10/2018 1929   UROBILINOGEN 0.2 10/06/2013 0305   NITRITE NEGATIVE 02/10/2018 1929   LEUKOCYTESUR NEGATIVE 02/10/2018 1929    Radiological Exams on Admission: Ct Angio Head W Or Wo Contrast  Result Date: 07/28/2018 CLINICAL DATA:  Dizziness with recent episode of left-sided weakness. EXAM: CT ANGIOGRAPHY HEAD AND NECK TECHNIQUE: Multidetector CT imaging of the head and neck was performed using the standard protocol during bolus administration of intravenous contrast. Multiplanar CT image reconstructions and MIPs were obtained to evaluate the vascular anatomy. Carotid stenosis measurements (when applicable) are obtained utilizing NASCET criteria, using the distal internal carotid diameter as the denominator. CONTRAST:  196m OMNIPAQUE IOHEXOL 350 MG/ML SOLN COMPARISON:  None. FINDINGS: CT HEAD FINDINGS Brain: There is no mass, hemorrhage or extra-axial collection. The size and configuration of the ventricles and extra-axial CSF spaces are normal. There is no acute or chronic infarction. The brain parenchyma is normal. Skull: The visualized skull base, calvarium and  extracranial soft tissues are normal. Sinuses/Orbits: No fluid levels or advanced mucosal thickening of the visualized paranasal sinuses. No mastoid or middle ear effusion. The orbits are normal. CTA NECK FINDINGS SKELETON: There is no bony spinal canal stenosis. No lytic or blastic lesion. OTHER NECK: Normal pharynx, larynx and major salivary glands. No cervical lymphadenopathy. Unremarkable thyroid gland. UPPER CHEST: No pneumothorax or pleural effusion. No nodules or masses. AORTIC ARCH: There is no calcific atherosclerosis of the aortic arch. There is no aneurysm, dissection or hemodynamically significant stenosis of the visualized ascending aorta and aortic arch. Conventional 3 vessel aortic branching pattern. The visualized proximal subclavian arteries are widely patent. RIGHT CAROTID SYSTEM: --Common carotid artery: Widely patent origin without common carotid artery dissection or aneurysm. --Internal carotid artery: No dissection, occlusion or aneurysm. There is mixed density atherosclerosis extending into the proximal ICA, resulting in less than 50% stenosis. --External carotid artery: No acute abnormality. LEFT CAROTID SYSTEM: --Common carotid artery: Widely patent origin without common carotid artery dissection or aneurysm. --Internal carotid artery: No dissection, occlusion or aneurysm. There is predominantly hypodense atherosclerosis extending into the proximal ICA, resulting in less than 50% stenosis. --External carotid artery: No acute abnormality. VERTEBRAL ARTERIES: Left dominant configuration. Both origins are normal. Both V1, V2 and V3 segments are normal. CTA HEAD FINDINGS POSTERIOR CIRCULATION: --Vertebral arteries: The right V4 segment is occluded just proximal to the vertebrobasilar confluence, unchanged. There is an endovascular stent extending from the mid left V4 segment to the distal basilar artery. --Posterior inferior cerebellar arteries (PICA): Patent origins from the vertebral arteries.  --Anterior inferior cerebellar arteries (AICA): Patent origins from the basilar artery. --Basilar artery: Patent stent. --Superior cerebellar arteries: Normal. --Posterior cerebral arteries (PCA): Normal. The left PCA is predominantly supplied by the posterior communicating artery. ANTERIOR CIRCULATION: --Intracranial internal carotid arteries: Normal. --Anterior  cerebral arteries (ACA): Normal. Both A1 segments are present. Patent anterior communicating artery (a-comm). --Middle cerebral arteries (MCA): Normal. VENOUS SINUSES: As permitted by contrast timing, patent. ANATOMIC VARIANTS: Fetal origin left PCA. Review of the MIP images confirms the above findings. IMPRESSION: 1. No emergent large vessel occlusion. 2. 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 Patent left vertebral-basilar stent. 3. Unchanged bilateral carotid bifurcation atherosclerosis without hemodynamically significant stenosis. Electronically Signed   By: Ulyses Jarred M.D.   On: 07/28/2018 00:26   Ct Angio Neck W And/or Wo Contrast  Result Date: 07/28/2018 CLINICAL DATA:  Dizziness with recent episode of left-sided weakness. EXAM: CT ANGIOGRAPHY HEAD AND NECK TECHNIQUE: Multidetector CT imaging of the head and neck was performed using the standard protocol during bolus administration of intravenous contrast. Multiplanar CT image reconstructions and MIPs were obtained to evaluate the vascular anatomy. Carotid stenosis measurements (when applicable) are obtained utilizing NASCET criteria, using the distal internal carotid diameter as the denominator. CONTRAST:  135m OMNIPAQUE IOHEXOL 350 MG/ML SOLN COMPARISON:  None. FINDINGS: CT HEAD FINDINGS Brain: There is no mass, hemorrhage or extra-axial collection. The size and configuration of the ventricles and extra-axial CSF spaces are normal. There is no acute or chronic infarction. The brain parenchyma is normal. Skull: The visualized skull base, calvarium and  extracranial soft tissues are normal. Sinuses/Orbits: No fluid levels or advanced mucosal thickening of the visualized paranasal sinuses. No mastoid or middle ear effusion. The orbits are normal. CTA NECK FINDINGS SKELETON: There is no bony spinal canal stenosis. No lytic or blastic lesion. OTHER NECK: Normal pharynx, larynx and major salivary glands. No cervical lymphadenopathy. Unremarkable thyroid gland. UPPER CHEST: No pneumothorax or pleural effusion. No nodules or masses. AORTIC ARCH: There is no calcific atherosclerosis of the aortic arch. There is no aneurysm, dissection or hemodynamically significant stenosis of the visualized ascending aorta and aortic arch. Conventional 3 vessel aortic branching pattern. The visualized proximal subclavian arteries are widely patent. RIGHT CAROTID SYSTEM: --Common carotid artery: Widely patent origin without common carotid artery dissection or aneurysm. --Internal carotid artery: No dissection, occlusion or aneurysm. There is mixed density atherosclerosis extending into the proximal ICA, resulting in less than 50% stenosis. --External carotid artery: No acute abnormality. LEFT CAROTID SYSTEM: --Common carotid artery: Widely patent origin without common carotid artery dissection or aneurysm. --Internal carotid artery: No dissection, occlusion or aneurysm. There is predominantly hypodense atherosclerosis extending into the proximal ICA, resulting in less than 50% stenosis. --External carotid artery: No acute abnormality. VERTEBRAL ARTERIES: Left dominant configuration. Both origins are normal. Both V1, V2 and V3 segments are normal. CTA HEAD FINDINGS POSTERIOR CIRCULATION: --Vertebral arteries: The right V4 segment is occluded just proximal to the vertebrobasilar confluence, unchanged. There is an endovascular stent extending from the mid left V4 segment to the distal basilar artery. --Posterior inferior cerebellar arteries (PICA): Patent origins from the vertebral arteries.  --Anterior inferior cerebellar arteries (AICA): Patent origins from the basilar artery. --Basilar artery: Patent stent. --Superior cerebellar arteries: Normal. --Posterior cerebral arteries (PCA): Normal. The left PCA is predominantly supplied by the posterior communicating artery. ANTERIOR CIRCULATION: --Intracranial internal carotid arteries: Normal. --Anterior cerebral arteries (ACA): Normal. Both A1 segments are present. Patent anterior communicating artery (a-comm). --Middle cerebral arteries (MCA): Normal. VENOUS SINUSES: As permitted by contrast timing, patent. ANATOMIC VARIANTS: Fetal origin left PCA. Review of the MIP images confirms the above findings. IMPRESSION: 1. No emergent large vessel occlusion. 2. Unchanged occlusion of the distal V4 segment of  the right vertebral artery. Patent left vertebral-basilar stent. 3. Unchanged bilateral carotid bifurcation atherosclerosis without hemodynamically significant stenosis. Electronically Signed   By: Ulyses Jarred M.D.   On: 07/28/2018 00:26   Dg Abdomen Acute W/chest  Result Date: 07/28/2018 CLINICAL DATA:  Vomiting, diarrhea, abdominal pain. EXAM: DG ABDOMEN ACUTE W/ 1V CHEST COMPARISON:  Radiographs and CT 11/30/2017 FINDINGS: Vague subpleural opacities at the right lung base. Left lung is clear. Heart is normal in size. Normal mediastinal contours. Minimal apical emphysema prior CT not well appreciated radiographically. No pleural fluid or pneumothorax. Cholecystectomy clips in the right upper quadrant. Scattered air throughout nondilated small and large bowel in a nonobstructive pattern. No evidence of free air. Excreted IV contrast within both renal collecting systems in the urinary bladder from recent head and neck CTA. There are pelvic phleboliths. No acute osseous abnormalities. IMPRESSION: 1. Nonobstructive bowel gas pattern. 2. Vague subpleural opacities at the right lung base, may be atelectasis or scarring, less likely infectious.  Electronically Signed   By: Keith Rake M.D.   On: 07/28/2018 01:46   02/09/2018 echocardiogram LV EF: 35% -   40%  ------------------------------------------------------------------- History:   PMH:   Coronary artery disease.  Stroke.  Transient ischemic attack.  Risk factors:  Hypertension. Diabetes mellitus. Dyslipidemia.  ------------------------------------------------------------------- Study Conclusions  - Left ventricle: The cavity size was normal. There was mild   concentric hypertrophy. Systolic function was moderately reduced.   The estimated ejection fraction was in the range of 35% to 40%.   Diffuse hypokinesis. Doppler parameters are consistent with   abnormal left ventricular relaxation (grade 1 diastolic   dysfunction). - Mitral valve: There was mild regurgitation. - Pericardium, extracardiac: A trivial pericardial effusion was   identified along the right ventricular free wall.  Impressions:  - Compared to the prior study, there has been no significant   interval change  EKG: Independently reviewed.   Assessment/Plan Principal Problem:   Vertebral basilar insufficiency Observation/telemetry. Supplemental oxygen as needed. Continue gentle IV hydration. Continue aspirin 81 mg p.o. daily. Continue Brilinta, the patient is unable to afford clopidogrel at this time. Check MRI/MRA of brain at [facility].  Active Problems:   Tobacco abuse Declined nicotine replacement therapy for the moment. Staff to provide tobacco cessation information.    Hypertension Allow permissive hypertension for now. Monitor blood pressure.    Hyperlipidemia Continue statin and fenofibrate.    Hypothyroidism Continue level thyroxine 250 mg p.o. daily.    Type 2 diabetes mellitus without complications (HCC) Carbohydrate modified diet. CBG monitoring regular insulin sliding scale Continue Amaryl 2 mg p.o. daily. Continue gabapentin 300 mg p.o. twice daily for neuropathy.  Check hemoglobin A1c.    Coronary artery disease Continue aspirin, Brilinta, and fenofibrate.    Chronic combined systolic and diastolic CHF (congestive heart failure) ([place]) The patient seems to be volume depleted. Gentle and time-limited IV hydration given history of low EF.     DVT prophylaxis: Lovenox SQ. Code Status: Full code. Family Communication:  Disposition Plan: Observation and further work-up for VBI Consults called: Admission status: Observation/telemetry.   Reubin Milan MD [facility]  07/28/2018, 3:38 AM   This document is being prepared with Dragon voice recognition software and may contain some unintended transcription errors.

## 2018-07-28 NOTE — Consult Note (Addendum)
NEUROLOGY CONSULT  Reason for Consult: Neurologic opinion for VBI Referring Physician: Dr Olevia Bowens  CC: Dizziness  HPI: Devin [name] is an 48 y.o. male with medical history significant of asthma, CAD, history of pericardial effusion, ischemic cardiomyopathy, depression, GERD, headaches, history of urolithiasis, history of rhabdomyolysis, hyperlipidemia, hypertension, hypothyroidism, sleep apnea not on CPAP, tobacco abuse, type 2 diabetes, previous strokes and known VBI requiring stenting.   On 5/16 he came to the ER at [facility] with complaints of nausea, vomiting and diarrhea x1 week. He states he has also been getting "dizzy spells" often at home. He denies headache, blurred vision, vertigo sensation, slurred speech or focal weakness of extremities. He was transferred to [facility] for further care.   Past Medical History Past Medical History:  Diagnosis Date  . Asthma   . Chest pain   . Coronary artery disease    a. s/p DES x2 to LAD and DES to OM in 11/2017  . Depression   . Dyspnea   . GERD (gastroesophageal reflux disease)   . Headache   . History of kidney stones   . History of rhabdomyolysis   . Hyperlipidemia   . Hypertension   . Hypothyroidism   . Ischemic cardiomyopathy    a. 11/2017: echo showing EF of 35-40%, diffuse HK, and Grade 2 DD  . Kidney calculus 2014  . Pericardial effusion    Small, by dobutamine echocardiogram, 04/2006  . PONV (postoperative nausea and vomiting)   . Sleep apnea    does not wear cpap  . Stroke ([place])   . Tobacco abuse   . Type 2 diabetes mellitus without complications ([place]) 9/93/7169  . Vitamin D deficiency     Past Surgical History Past Surgical History:  Procedure Laterality Date  . APPENDECTOMY    . 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 CHOLECYSTECTOMY N/A 01/27/2013   Procedure: LAPAROSCOPIC CHOLECYSTECTOMY;  Surgeon: Jamesetta So, MD;  Location: [facility]  ORS;  Service: General;  Laterality: N/A;  . CORONARY STENT INTERVENTION N/A 11/15/2017   Procedure: CORONARY STENT INTERVENTION;  Surgeon: Leonie Man, MD;  Location: [facility];  Service: Cardiovascular;  Laterality: N/A;  . HERNIA REPAIR     As a child  . INSERTION OF MESH N/A 11/13/2012   Procedure: INSERTION OF MESH;  Surgeon: Jamesetta So, MD;  Location: [facility] ORS;  Service: General;  Laterality: N/A;  . IR ANGIO INTRA EXTRACRAN SEL COM CAROTID INNOMINATE BILAT MOD SED  12/01/2017  . IR ANGIO INTRA EXTRACRAN SEL COM CAROTID INNOMINATE BILAT MOD SED  02/13/2018  . IR ANGIO VERTEBRAL SEL SUBCLAVIAN INNOMINATE UNI L MOD SED  02/13/2018  . IR ANGIO VERTEBRAL SEL VERTEBRAL BILAT MOD SED  12/01/2017  . IR ANGIO VERTEBRAL SEL VERTEBRAL UNI R MOD SED  02/13/2018  . IR INTRA CRAN STENT  12/03/2017  . IR PTA INTRACRANIAL  02/25/2018  . IR US GUIDE VASC ACCESS RIGHT  02/13/2018  . KIDNEY STONE SURGERY    . LEFT HEART CATH AND CORONARY ANGIOGRAPHY N/A 11/15/2017   Procedure: LEFT HEART CATH AND CORONARY ANGIOGRAPHY;  Surgeon: Leonie Man, MD;  Location: [facility];  Service: Cardiovascular;  Laterality: N/A;  . PERCUTANEOUS NEPHROLITHOTRIPSY    . RADIOLOGY WITH ANESTHESIA Left 12/03/2017   Procedure: Angioplasty with possible stenting of left VBJ;  Surgeon: Luanne Bras, MD;  Location: [place];  Service: Radiology;  Laterality: Left;  . RADIOLOGY WITH ANESTHESIA N/A 02/25/2018   Procedure: STENT PLACEMENT;  Surgeon: Luanne Bras, MD;  Location: [place];  Service: Radiology;  Laterality: N/A;  . UMBILICAL HERNIA REPAIR N/A 11/13/2012   Procedure: UMBILICAL HERNIORRHAPHY;  Surgeon: Jamesetta So, MD;  Location: [facility];  Service: General;  Laterality: N/A;  . VARICOCELECTOMY      Family History Family History  Problem Relation Age of Onset  . Coronary artery disease Father   . Hypertension Father   . Hyperlipidemia Father   . Diabetes Father   . Congestive Heart Failure  Father 54  . Arrhythmia Father        had an ICD  . Diabetes Mother   . Hypertension Mother   . Hyperlipidemia Mother   . Obesity Mother 62       died after bariatric surgery, liver failure and infection  . Coronary artery disease Maternal Grandfather        both grandfathers and several uncles  . Coronary artery disease Other   . Diabetes Sister        both sisters  . Stroke Maternal Aunt   . Stroke Maternal Uncle     Social History    reports that he has been smoking cigarettes. He started smoking about 33 years ago. He has a 6.50 pack-year smoking history. He quit smokeless tobacco use about 31 years ago.  His smokeless tobacco use included snuff and chew. He reports that he does not drink alcohol or use drugs.  Allergies Allergies  Allergen Reactions  . Crestor [Rosuvastatin] Other (See Comments)    rhabdomyolosis  . Bee Venom Swelling    SWELLING REACTION UNSPECIFIED     Home Medications Medications Prior to Admission  Medication Sig Dispense Refill  . aspirin 81 MG EC tablet Take 1 tablet (81 mg total) by mouth daily. With food 30 tablet 0  . Blood Glucose Monitoring Suppl w/Device KIT Dispense based on patient and insurance preference. Check blood sugar daily before breakfast (ICD9 250.0) 1 each 0  . citalopram (CELEXA) 10 MG tablet Take 1 tablet (10 mg total) by mouth daily. 30 tablet 3  . EUTHYROX 200 MCG tablet Take 200 mcg by mouth daily.    Arna Medici 50 MCG tablet Take 50 mcg by mouth daily.    Marland Kitchen ezetimibe (ZETIA) 10 MG tablet Take 1 tablet (10 mg total) by mouth daily. 30 tablet 3  . fenofibrate 160 MG tablet Take 1 tablet (160 mg total) by mouth daily. 30 tablet 3  . gabapentin (NEURONTIN) 300 MG capsule Take 1 capsule (300 mg total) by mouth 2 (two) times daily. 60 capsule 4  . glimepiride (AMARYL) 2 MG tablet Take 1 tablet (2 mg total) by mouth daily with breakfast. 30 tablet 0  . losartan (COZAAR) 25 MG tablet Take 0.5 tablets (12.5 mg total) by mouth daily.  30 tablet 2  . pantoprazole (PROTONIX) 40 MG tablet Take 40 mg by mouth every morning.    . clopidogrel (PLAVIX) 75 MG tablet Take 300 mg (4 tablets) on day 1 ONLY. Then take 75 mg (1 tablet) daily. 94 tablet 3  . nitroGLYCERIN (NITROSTAT) 0.4 MG SL tablet Place 1 tablet (0.4 mg total) under the tongue every 5 (five) minutes as needed for chest pain. 25 tablet 3    Hospital Medications .  stroke: mapping our early stages of recovery book   Does not apply Once  . enoxaparin (LOVENOX) injection  60 mg Subcutaneous  Q24H  . sodium chloride flush  3 mL Intravenous Once     ROS: History obtained from pt  General ROS: negative for - chills, fatigue, fever, night sweats, weight gain or weight loss Psychological ROS: negative for - behavioral disorder, hallucinations, memory difficulties, mood swings or suicidal ideation Ophthalmic ROS: negative for - blurry vision, double vision, eye pain or loss of vision ENT ROS: negative for - epistaxis, nasal discharge, oral lesions, sore throat, tinnitus or vertigo Allergy and Immunology ROS: negative for - hives or itchy/watery eyes Hematological and Lymphatic ROS: negative for - bleeding problems, bruising or swollen lymph nodes Endocrine ROS: negative for - galactorrhea, hair pattern changes, polydipsia/polyuria or temperature intolerance Respiratory ROS: negative for - cough, hemoptysis, shortness of breath or wheezing Cardiovascular ROS: negative for - chest pain, dyspnea on exertion, edema or irregular heartbeat Gastrointestinal ROS: negative for - abdominal pain or stool incontinence. Positive for diarrhea, hematemesis, nausea/vomiting Genito-Urinary ROS: negative for - dysuria, hematuria, incontinence or urinary frequency/urgency Musculoskeletal ROS: negative for - joint swelling or muscular weakness Neurological ROS: as noted in HPI Dermatological ROS: negative for rash and skin lesion changes   Physical Examination:  Vitals:   07/28/18 0243  07/28/18 0246 07/28/18 0422 07/28/18 0515  BP: 126/76  121/82   Pulse:   86   Resp: (!) 24  (!) 23   Temp:      TempSrc:      SpO2:  100% 96%   Weight:    117.9 kg  Height:    '6\' 6"'$  (1.981 m)    General - chronically ill appearing; no acute distress Heart - Regular rate and rhythm - no murmer Lungs - Clear to auscultation Abdomen - Soft - some mid abd tenderness Extremities - Distal pulses intact - no edema Skin - Warm and dry  Neurologic Examination:   Mental Status:  Alert, oriented, thought content appropriate. Speech without evidence of dysarthria or aphasia. Able to follow 3 step commands without difficulty.  Cranial Nerves:  Gaze Conjugate. PERRL. EOMI. Slight left facial droop noted, no sensation difference reported. bilateral visual fields intact. hearing normal. midline tongue extension  Motor: Right : Upper extremity   5/5    Left:     Upper extremity   5/5  Lower extremity   5/5     Lower extremity   5/5 Tone and bulk:normal tone throughout; no atrophy noted Sensory: Intact to light touch in all extremities. Deep Tendon Reflexes: 2/4 throughout Plantars: Downgoing bilaterally  Cerebellar: Normal finger to nose and heel to shin bilaterally. Gait: not tested   LABORATORY STUDIES Reviewed:  Basic Metabolic Panel: Recent Labs  Lab 07/27/18 2225  NA 139  K 4.6  CL 102  CO2 23  GLUCOSE 242*  BUN 21*  CREATININE 1.38*  CALCIUM 10.0  MG 1.7    Liver Function Tests: Recent Labs  Lab 07/27/18 2225  AST 30  ALT 40  ALKPHOS 91  BILITOT 1.0  PROT 8.5*  ALBUMIN 4.5   Recent Labs  Lab 07/27/18 2225  LIPASE 26   No results for input(s): AMMONIA in the last 168 hours.  CBC: Recent Labs  Lab 07/27/18 2225  WBC 13.1*  HGB 16.2  HCT 49.5  MCV 86.2  PLT 342    Cardiac Enzymes: Recent Labs  Lab 07/27/18 2226  CKTOTAL 86  TROPONINI <0.03    BNP: Invalid input(s): POCBNP  CBG: Recent Labs  Lab 07/27/18 2221 07/28/18 0630  GLUCAP 252*  144*  Microbiology:   Coagulation Studies: No results for input(s): LABPROT, INR in the last 72 hours.  Urinalysis: No results for input(s): COLORURINE, LABSPEC, PHURINE, GLUCOSEU, HGBUR, BILIRUBINUR, KETONESUR, PROTEINUR, UROBILINOGEN, NITRITE, LEUKOCYTESUR in the last 168 hours.  Invalid input(s): APPERANCEUR  Lipid Panel:     Component Value Date/Time   CHOL 211 (H) 02/11/2018 0615   TRIG 391 (H) 02/11/2018 0615   HDL 25 (L) 02/11/2018 0615   CHOLHDL 8.4 02/11/2018 0615   VLDL 78 (H) 02/11/2018 0615   LDLCALC 108 (H) 02/11/2018 0615    HgbA1C:  Lab Results  Component Value Date   HGBA1C 7.2 (H) 02/11/2018    Urine Drug Screen:      Component Value Date/Time   LABOPIA NONE DETECTED 02/10/2018 1929   COCAINSCRNUR NONE DETECTED 02/10/2018 1929   LABBENZ NONE DETECTED 02/10/2018 1929   AMPHETMU NONE DETECTED 02/10/2018 1929   THCU NONE DETECTED 02/10/2018 1929   LABBARB NONE DETECTED 02/10/2018 1929     Alcohol Level:  No results for input(s): ETH in the last 168 hours.  Miscellaneous labs:  EKG  EKG  IMAGING: Ct Angio Head W Or Wo Contrast  Result Date: 07/28/2018 CLINICAL DATA:  Dizziness with recent episode of left-sided weakness. EXAM: CT ANGIOGRAPHY HEAD AND NECK TECHNIQUE: Multidetector CT imaging of the head and neck was performed using the standard protocol during bolus administration of intravenous contrast. Multiplanar CT image reconstructions and MIPs were obtained to evaluate the vascular anatomy. Carotid stenosis measurements (when applicable) are obtained utilizing NASCET criteria, using the distal internal carotid diameter as the denominator. CONTRAST:  165m OMNIPAQUE IOHEXOL 350 MG/ML SOLN COMPARISON:  None. FINDINGS: CT HEAD FINDINGS Brain: There is no mass, hemorrhage or extra-axial collection. The size and configuration of the ventricles and extra-axial CSF spaces are normal. There is no acute or chronic infarction. The brain parenchyma is  normal. Skull: The visualized skull base, calvarium and extracranial soft tissues are normal. Sinuses/Orbits: No fluid levels or advanced mucosal thickening of the visualized paranasal sinuses. No mastoid or middle ear effusion. The orbits are normal. CTA NECK FINDINGS SKELETON: There is no bony spinal canal stenosis. No lytic or blastic lesion. OTHER NECK: Normal pharynx, larynx and major salivary glands. No cervical lymphadenopathy. Unremarkable thyroid gland. UPPER CHEST: No pneumothorax or pleural effusion. No nodules or masses. AORTIC ARCH: There is no calcific atherosclerosis of the aortic arch. There is no aneurysm, dissection or hemodynamically significant stenosis of the visualized ascending aorta and aortic arch. Conventional 3 vessel aortic branching pattern. The visualized proximal subclavian arteries are widely patent. RIGHT CAROTID SYSTEM: --Common carotid artery: Widely patent origin without common carotid artery dissection or aneurysm. --Internal carotid artery: No dissection, occlusion or aneurysm. There is mixed density atherosclerosis extending into the proximal ICA, resulting in less than 50% stenosis. --External carotid artery: No acute abnormality. LEFT CAROTID SYSTEM: --Common carotid artery: Widely patent origin without common carotid artery dissection or aneurysm. --Internal carotid artery: No dissection, occlusion or aneurysm. There is predominantly hypodense atherosclerosis extending into the proximal ICA, resulting in less than 50% stenosis. --External carotid artery: No acute abnormality. VERTEBRAL ARTERIES: Left dominant configuration. Both origins are normal. Both V1, V2 and V3 segments are normal. CTA HEAD FINDINGS POSTERIOR CIRCULATION: --Vertebral arteries: The right V4 segment is occluded just proximal to the vertebrobasilar confluence, unchanged. There is an endovascular stent extending from the mid left V4 segment to the distal basilar artery. --Posterior inferior cerebellar  arteries (PICA): Patent origins from the vertebral arteries. --Anterior  inferior cerebellar arteries (AICA): Patent origins from the basilar artery. --Basilar artery: Patent stent. --Superior cerebellar arteries: Normal. --Posterior cerebral arteries (PCA): Normal. The left PCA is predominantly supplied by the posterior communicating artery. ANTERIOR CIRCULATION: --Intracranial internal carotid arteries: Normal. --Anterior cerebral arteries (ACA): Normal. Both A1 segments are present. Patent anterior communicating artery (a-comm). --Middle cerebral arteries (MCA): Normal. VENOUS SINUSES: As permitted by contrast timing, patent. ANATOMIC VARIANTS: Fetal origin left PCA. Review of the MIP images confirms the above findings. IMPRESSION: 1. No emergent large vessel occlusion. 2. 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 Patent left vertebral-basilar stent. 3. Unchanged bilateral carotid bifurcation atherosclerosis without hemodynamically significant stenosis. Electronically Signed   By: Ulyses Jarred M.D.   On: 07/28/2018 00:26   Ct Angio Neck W And/or Wo Contrast  Result Date: 07/28/2018 CLINICAL DATA:  Dizziness with recent episode of left-sided weakness. EXAM: CT ANGIOGRAPHY HEAD AND NECK TECHNIQUE: Multidetector CT imaging of the head and neck was performed using the standard protocol during bolus administration of intravenous contrast. Multiplanar CT image reconstructions and MIPs were obtained to evaluate the vascular anatomy. Carotid stenosis measurements (when applicable) are obtained utilizing NASCET criteria, using the distal internal carotid diameter as the denominator. CONTRAST:  152m OMNIPAQUE IOHEXOL 350 MG/ML SOLN COMPARISON:  None. FINDINGS: CT HEAD FINDINGS Brain: There is no mass, hemorrhage or extra-axial collection. The size and configuration of the ventricles and extra-axial CSF spaces are normal. There is no acute or chronic infarction. The brain parenchyma is  normal. Skull: The visualized skull base, calvarium and extracranial soft tissues are normal. Sinuses/Orbits: No fluid levels or advanced mucosal thickening of the visualized paranasal sinuses. No mastoid or middle ear effusion. The orbits are normal. CTA NECK FINDINGS SKELETON: There is no bony spinal canal stenosis. No lytic or blastic lesion. OTHER NECK: Normal pharynx, larynx and major salivary glands. No cervical lymphadenopathy. Unremarkable thyroid gland. UPPER CHEST: No pneumothorax or pleural effusion. No nodules or masses. AORTIC ARCH: There is no calcific atherosclerosis of the aortic arch. There is no aneurysm, dissection or hemodynamically significant stenosis of the visualized ascending aorta and aortic arch. Conventional 3 vessel aortic branching pattern. The visualized proximal subclavian arteries are widely patent. RIGHT CAROTID SYSTEM: --Common carotid artery: Widely patent origin without common carotid artery dissection or aneurysm. --Internal carotid artery: No dissection, occlusion or aneurysm. There is mixed density atherosclerosis extending into the proximal ICA, resulting in less than 50% stenosis. --External carotid artery: No acute abnormality. LEFT CAROTID SYSTEM: --Common carotid artery: Widely patent origin without common carotid artery dissection or aneurysm. --Internal carotid artery: No dissection, occlusion or aneurysm. There is predominantly hypodense atherosclerosis extending into the proximal ICA, resulting in less than 50% stenosis. --External carotid artery: No acute abnormality. VERTEBRAL ARTERIES: Left dominant configuration. Both origins are normal. Both V1, V2 and V3 segments are normal. CTA HEAD FINDINGS POSTERIOR CIRCULATION: --Vertebral arteries: The right V4 segment is occluded just proximal to the vertebrobasilar confluence, unchanged. There is an endovascular stent extending from the mid left V4 segment to the distal basilar artery. --Posterior inferior cerebellar  arteries (PICA): Patent origins from the vertebral arteries. --Anterior inferior cerebellar arteries (AICA): Patent origins from the basilar artery. --Basilar artery: Patent stent. --Superior cerebellar arteries: Normal. --Posterior cerebral arteries (PCA): Normal. The left PCA is predominantly supplied by the posterior communicating artery. ANTERIOR CIRCULATION: --Intracranial internal carotid arteries: Normal. --Anterior cerebral arteries (ACA): Normal. Both A1 segments are present. Patent anterior communicating artery (a-comm). --Middle cerebral  arteries (MCA): Normal. VENOUS SINUSES: As permitted by contrast timing, patent. ANATOMIC VARIANTS: Fetal origin left PCA. Review of the MIP images confirms the above findings. IMPRESSION: 1. No emergent large vessel occlusion. 2. 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 Patent left vertebral-basilar stent. 3. Unchanged bilateral carotid bifurcation atherosclerosis without hemodynamically significant stenosis. Electronically Signed   By: Ulyses Jarred M.D.   On: 07/28/2018 00:26   Dg Abdomen Acute W/chest  Result Date: 07/28/2018 CLINICAL DATA:  Vomiting, diarrhea, abdominal pain. EXAM: DG ABDOMEN ACUTE W/ 1V CHEST COMPARISON:  Radiographs and CT 11/30/2017 FINDINGS: Vague subpleural opacities at the right lung base. Left lung is clear. Heart is normal in size. Normal mediastinal contours. Minimal apical emphysema prior CT not well appreciated radiographically. No pleural fluid or pneumothorax. Cholecystectomy clips in the right upper quadrant. Scattered air throughout nondilated small and large bowel in a nonobstructive pattern. No evidence of free air. Excreted IV contrast within both renal collecting systems in the urinary bladder from recent head and neck CTA. There are pelvic phleboliths. No acute osseous abnormalities. IMPRESSION: 1. Nonobstructive bowel gas pattern. 2. Vague subpleural opacities at the right lung base, may be  atelectasis or scarring, less likely infectious. Electronically Signed   By: Keith Rake M.D.   On: 07/28/2018 01:46     Assessment/Plan: This is 48yrold with complex PMH as listed above, including prev strokes and VBI with stenting done in Sep 2019 and then requiring balloon angioplasty in 12/19 when stent stenosed. He is compliant with DAPT. He comes to the hospital this admit with intermittent dizziness in setting of acute GI illness and dehydration.   # Dizziness spells- in setting of acute illness with dehydration. Resolving as n/v and rehydration improves # VBI- s/p stent and angioplasty. MRA confirms Lt VB  stent is patent. Rt V4 occlusion remains stable/unchanged. Now new strokes seen on MRI # N/V/Diarrhea- gentlel IVF hydration and supportive care. Avoid hypotension # CAD &HF- EF 35%- monitor I&O and gentle IVF for support. Con't vascular med mgt # HLD- continue home statin/fenofibrate # DM2- tight control. A1C pending # Tobacco use- d/w pt quitting  RECS: Hydration; supportive care I have started ASA + Plavix from home list; asked RN to give now- do NOT miss today's dose We discussed that he cannot afford the Brilinta that was originally prescribed last admit, he will be able to get Plavix. Educated on importance of DAPT and to never miss a dose.  Stroke education reviewed Tele monitoring Max med mgt of all vascular comorbidities  Desiree Metzger-Cihelka, ARNP-C, ANVP-BC Attending neurologist's note to follow  I have seen the patient and reviewed the above note.  On my exam he actually has 4/5 weakness on the left.   though he cannot afford Brilinta he still has a month supply at home and I would continue this until he runs out of the [place]  It may be worthwhile to give him a prescription for Plavix in case he is getting low on the Brilinta and cannot get care [place]  I impressed on the importance of not stopping this medication without discussing with Dr.  DEstanislado Pandy  With improvement in his symptoms, I doubt the stent played a significant role in suspect it was more generalized due to dehydration.  No further recommendations from an inpatient standpoint, please call with further questions or concerns.  MRoland Rack MD [facility] [phone] If 7pm- 7am, please page neurology on call  as listed in [place].

## 2018-07-28 NOTE — Discharge Instructions (Signed)
Devin [name],  You were in the hospital because of your dizziness with concern for an issue with your arteries. Your tests show things continue to look stable with the arteries in your neck/head. You were dehydrated and received IV fluids. Please follow-up with your cardiologist with regard to your Plavix. At this time, please continue your Brillinta and aspirin. It is important that you stay on these medication, so please do not run out of your Brillinta and try to resolve the issue with Plavix as soon as possible.

## 2018-07-28 NOTE — ED Notes (Addendum)
[facility] arrived to transport

## 2018-07-28 NOTE — ED Notes (Signed)
Pt has not had any emesis since arrival

## 2018-07-28 NOTE — Discharge Summary (Signed)
Physician Discharge Summary  Devin [name] KTG:[id number] DOB: 08/30/1970 DOA: 07/27/2018  PCP: Sandria Manly [place] date: 07/09/7679 Discharge date: 07/28/2018  Admitted From: Home Disposition: Home  Recommendations for Outpatient Follow-up:  1. Follow up with PCP in 1 week 2. Please obtain BMP/CBC in one week 3. Please follow up on the following pending results: None  Home Health: None Equipment/Devices: None  Discharge Condition: Stable CODE STATUS: Full code Diet recommendation: Heart healthy/carb modified   Brief/Interim Summary:  Admission HPI written by Reubin Milan, MD   Chief Complaint: Nausea, vomiting and diarrhea.  HPI: Devin [name] is a 48 y.o. male with medical history significant of asthma, CAD, history of pericardial effusion, ischemic cardiomyopathy, depression, GERD, headaches, history of urolithiasis, history of rhabdomyolysis, hyperlipidemia, hypertension, hypothyroidism, sleep apnea not on CPAP, tobacco abuse, type 2 diabetes, vitamin D deficiency who is coming to the emergency department with complaints of a week history of nausea, vomiting and diarrhea.  He denies abdominal pain, but states his abdominal muscles feel sore from vomiting.  No melena, hematochezia, flank pain, dysuria, frequency or hematuria.  He has also been getting "dizzy spells" often at home.  He denies headache, blurred vision, vertigo sensation, slurred speech or focal weakness of extremities.  He denies chest pain, palpitations, diaphoresis, PND, orthopnea or pitting edema of the lower extremities.  He denies fever, chills, sore throat, rhinorrhea, wheezing, hemoptysis.  Denies polyuria, polydipsia, polyphagia or blurred vision.  ED Course: Initial vital signs temperature 98 F, pulse 102, respirations 14, blood pressure 114/73 mmHg and O2 sat 98% on room air.  The patient received a 1000 mL LR bolus in the ED.    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 MRI/MRA obtained and confirmed patent stent with no evidence of new strokes. Neurology seen and examined with no recommendations for follow-up. Continue DAPT (Brillinta and aspirin as he cannot afford Plavix at this time).  Dehydration Secondary to nausea/vomiting. Resolved. Given IV fluids with improvement in symptoms.  Tobacco abuse Cessation information given.  Essential hypertension Resume home losartan  Hyperlipidemia Continue Zetia  Hypothyroidism Continue thyroxine  Diabetes mellitus, type 2 Continue home Amaryl  Neuropathy Continue gabapentin  Chronic combined systolic and diastolic heart failure CAD S/p DES x2. Recently switched from [place] to Plavix however, he cannot afford Plavix. Continue Brillinta and aspirin. Follow-up with cardiology.  Discharge Diagnoses:  Principal Problem:   Vertebral basilar insufficiency Active Problems:   Tobacco abuse   Hypertension   Hyperlipidemia   Hypothyroidism   Type 2 diabetes mellitus without complications (HCC)   Coronary artery disease   Chronic combined systolic and diastolic CHF (congestive heart failure) [facility])    Discharge Instructions  Discharge Instructions    Call MD for:  persistant dizziness or light-headedness   Complete by:  As directed    Call MD for:  persistant nausea and vomiting   Complete by:  As directed    Diet - low sodium heart healthy   Complete by:  As directed    Increase activity slowly   Complete by:  As directed      Allergies as of 07/28/2018      Reactions   Crestor [rosuvastatin] Other (See Comments)   rhabdomyolosis   Bee Venom Swelling   SWELLING REACTION UNSPECIFIED       Medication List    STOP taking these medications   clopidogrel 75 MG tablet Commonly known  as:  PLAVIX     TAKE these medications   aspirin 81 MG EC tablet Take 1 tablet (81 mg total) by mouth daily. With food   Blood Glucose  Monitoring Suppl w/Device Kit Dispense based on patient and insurance preference. Check blood sugar daily before breakfast (ICD9 250.0)   citalopram 10 MG tablet Commonly known as:  CeleXA Take 1 tablet (10 mg total) by mouth daily.   Euthyrox 200 MCG tablet Generic drug:  levothyroxine Take 200 mcg by mouth daily.   Euthyrox 50 MCG tablet Generic drug:  levothyroxine Take 50 mcg by mouth daily.   ezetimibe 10 MG tablet Commonly known as:  ZETIA Take 1 tablet (10 mg total) by mouth daily.   fenofibrate 160 MG tablet Take 1 tablet (160 mg total) by mouth daily.   gabapentin 300 MG capsule Commonly known as:  NEURONTIN Take 1 capsule (300 mg total) by mouth 2 (two) times daily.   glimepiride 2 MG tablet Commonly known as:  AMARYL Take 1 tablet (2 mg total) by mouth daily with breakfast.   losartan 25 MG tablet Commonly known as:  Cozaar Take 0.5 tablets (12.5 mg total) by mouth daily.   nitroGLYCERIN 0.4 MG SL tablet Commonly known as:  NITROSTAT Place 1 tablet (0.4 mg total) under the tongue every 5 (five) minutes as needed for chest pain.   pantoprazole 40 MG tablet Commonly known as:  PROTONIX Take 40 mg by mouth every morning.   ticagrelor 90 MG Tabs tablet Commonly known as:  BRILINTA Take 1 tablet (90 mg total) by mouth 2 (two) times daily. Start taking on:  Jul 29, 2018      Follow-up Information    Health, [facility]. Schedule an appointment as soon as possible for a visit in 1 week(s).   Contact information: [place] [address] [phone]        Arnoldo Lenis, MD. Schedule an appointment as soon as possible for a visit.   Specialty:  Cardiology Why:  Antiplatelet therapy Contact information: [address] [place] [address] [phone]          Allergies  Allergen Reactions   Crestor [Rosuvastatin] Other (See Comments)    rhabdomyolosis   Bee Venom Swelling    SWELLING REACTION UNSPECIFIED       Consultations:  Neurology   Procedures/Studies: Ct Angio Head W Or Wo Contrast  Result Date: 07/28/2018 CLINICAL DATA:  Dizziness with recent episode of left-sided weakness. EXAM: CT ANGIOGRAPHY HEAD AND NECK TECHNIQUE: Multidetector CT imaging of the head and neck was performed using the standard protocol during bolus administration of intravenous contrast. Multiplanar CT image reconstructions and MIPs were obtained to evaluate the vascular anatomy. Carotid stenosis measurements (when applicable) are obtained utilizing NASCET criteria, using the distal internal carotid diameter as the denominator. CONTRAST:  143m OMNIPAQUE IOHEXOL 350 MG/ML SOLN COMPARISON:  None. FINDINGS: CT HEAD FINDINGS Brain: There is no mass, hemorrhage or extra-axial collection. The size and configuration of the ventricles and extra-axial CSF spaces are normal. There is no acute or chronic infarction. The brain parenchyma is normal. Skull: The visualized skull base, calvarium and extracranial soft tissues are normal. Sinuses/Orbits: No fluid levels or advanced mucosal thickening of the visualized paranasal sinuses. No mastoid or middle ear effusion. The orbits are normal. CTA NECK FINDINGS SKELETON: There is no bony spinal canal stenosis. No lytic or blastic lesion. OTHER NECK: Normal pharynx, larynx and major salivary glands. No cervical  lymphadenopathy. Unremarkable thyroid gland. UPPER CHEST: No pneumothorax or pleural effusion. No nodules or masses. AORTIC ARCH: There is no calcific atherosclerosis of the aortic arch. There is no aneurysm, dissection or hemodynamically significant stenosis of the visualized ascending aorta and aortic arch. Conventional 3 vessel aortic branching pattern. The visualized proximal subclavian arteries are widely patent. RIGHT CAROTID SYSTEM: --Common carotid artery: Widely patent origin without common carotid artery dissection or aneurysm. --Internal carotid artery: No dissection, occlusion or  aneurysm. There is mixed density atherosclerosis extending into the proximal ICA, resulting in less than 50% stenosis. --External carotid artery: No acute abnormality. LEFT CAROTID SYSTEM: --Common carotid artery: Widely patent origin without common carotid artery dissection or aneurysm. --Internal carotid artery: No dissection, occlusion or aneurysm. There is predominantly hypodense atherosclerosis extending into the proximal ICA, resulting in less than 50% stenosis. --External carotid artery: No acute abnormality. VERTEBRAL ARTERIES: Left dominant configuration. Both origins are normal. Both V1, V2 and V3 segments are normal. CTA HEAD FINDINGS POSTERIOR CIRCULATION: --Vertebral arteries: The right V4 segment is occluded just proximal to the vertebrobasilar confluence, unchanged. There is an endovascular stent extending from the mid left V4 segment to the distal basilar artery. --Posterior inferior cerebellar arteries (PICA): Patent origins from the vertebral arteries. --Anterior inferior cerebellar arteries (AICA): Patent origins from the basilar artery. --Basilar artery: Patent stent. --Superior cerebellar arteries: Normal. --Posterior cerebral arteries (PCA): Normal. The left PCA is predominantly supplied by the posterior communicating artery. ANTERIOR CIRCULATION: --Intracranial internal carotid arteries: Normal. --Anterior cerebral arteries (ACA): Normal. Both A1 segments are present. Patent anterior communicating artery (a-comm). --Middle cerebral arteries (MCA): Normal. VENOUS SINUSES: As permitted by contrast timing, patent. ANATOMIC VARIANTS: Fetal origin left PCA. Review of the MIP images confirms the above findings. IMPRESSION: 1. No emergent large vessel occlusion. 2. 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 Patent left vertebral-basilar stent. 3. Unchanged bilateral carotid bifurcation atherosclerosis without hemodynamically significant stenosis. Electronically Signed    By: Ulyses Jarred M.D.   On: 07/28/2018 00:26   Ct Angio Neck W And/or Wo Contrast  Result Date: 07/28/2018 CLINICAL DATA:  Dizziness with recent episode of left-sided weakness. EXAM: CT ANGIOGRAPHY HEAD AND NECK TECHNIQUE: Multidetector CT imaging of the head and neck was performed using the standard protocol during bolus administration of intravenous contrast. Multiplanar CT image reconstructions and MIPs were obtained to evaluate the vascular anatomy. Carotid stenosis measurements (when applicable) are obtained utilizing NASCET criteria, using the distal internal carotid diameter as the denominator. CONTRAST:  150m OMNIPAQUE IOHEXOL 350 MG/ML SOLN COMPARISON:  None. FINDINGS: CT HEAD FINDINGS Brain: There is no mass, hemorrhage or extra-axial collection. The size and configuration of the ventricles and extra-axial CSF spaces are normal. There is no acute or chronic infarction. The brain parenchyma is normal. Skull: The visualized skull base, calvarium and extracranial soft tissues are normal. Sinuses/Orbits: No fluid levels or advanced mucosal thickening of the visualized paranasal sinuses. No mastoid or middle ear effusion. The orbits are normal. CTA NECK FINDINGS SKELETON: There is no bony spinal canal stenosis. No lytic or blastic lesion. OTHER NECK: Normal pharynx, larynx and major salivary glands. No cervical lymphadenopathy. Unremarkable thyroid gland. UPPER CHEST: No pneumothorax or pleural effusion. No nodules or masses. AORTIC ARCH: There is no calcific atherosclerosis of the aortic arch. There is no aneurysm, dissection or hemodynamically significant stenosis of the visualized ascending aorta and aortic arch. Conventional 3 vessel aortic branching pattern. The visualized proximal subclavian arteries are widely patent. RIGHT  CAROTID SYSTEM: --Common carotid artery: Widely patent origin without common carotid artery dissection or aneurysm. --Internal carotid artery: No dissection, occlusion or  aneurysm. There is mixed density atherosclerosis extending into the proximal ICA, resulting in less than 50% stenosis. --External carotid artery: No acute abnormality. LEFT CAROTID SYSTEM: --Common carotid artery: Widely patent origin without common carotid artery dissection or aneurysm. --Internal carotid artery: No dissection, occlusion or aneurysm. There is predominantly hypodense atherosclerosis extending into the proximal ICA, resulting in less than 50% stenosis. --External carotid artery: No acute abnormality. VERTEBRAL ARTERIES: Left dominant configuration. Both origins are normal. Both V1, V2 and V3 segments are normal. CTA HEAD FINDINGS POSTERIOR CIRCULATION: --Vertebral arteries: The right V4 segment is occluded just proximal to the vertebrobasilar confluence, unchanged. There is an endovascular stent extending from the mid left V4 segment to the distal basilar artery. --Posterior inferior cerebellar arteries (PICA): Patent origins from the vertebral arteries. --Anterior inferior cerebellar arteries (AICA): Patent origins from the basilar artery. --Basilar artery: Patent stent. --Superior cerebellar arteries: Normal. --Posterior cerebral arteries (PCA): Normal. The left PCA is predominantly supplied by the posterior communicating artery. ANTERIOR CIRCULATION: --Intracranial internal carotid arteries: Normal. --Anterior cerebral arteries (ACA): Normal. Both A1 segments are present. Patent anterior communicating artery (a-comm). --Middle cerebral arteries (MCA): Normal. VENOUS SINUSES: As permitted by contrast timing, patent. ANATOMIC VARIANTS: Fetal origin left PCA. Review of the MIP images confirms the above findings. IMPRESSION: 1. No emergent large vessel occlusion. 2. 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 Patent left vertebral-basilar stent. 3. Unchanged bilateral carotid bifurcation atherosclerosis without hemodynamically significant stenosis. Electronically Signed    By: Ulyses Jarred M.D.   On: 07/28/2018 00:26   Mr Brain Wo Contrast  Result Date: 07/28/2018 CLINICAL DATA:  Vertebrobasilar artery syndrome. Left vertebrobasilar stent. Occluded distal right vertebral artery. EXAM: MRI HEAD WITHOUT CONTRAST MRA HEAD WITHOUT CONTRAST TECHNIQUE: Multiplanar, multiecho pulse sequences of the brain and surrounding structures were obtained without intravenous contrast. Angiographic images of the head were obtained using MRA technique without contrast. COMPARISON:  CTA of the head and neck 07/27/2018. MRI brain 02/09/2018 FINDINGS: MRI HEAD FINDINGS Brain: The diffusion-weighted images demonstrate no acute or subacute infarction. Remote lacunar infarcts of the left cerebellum are again noted. No significant supratentorial white matter disease is present. The ventricles are of normal size. No significant extraaxial fluid collection is present. The internal auditory canals are within normal limits. Remote white matter changes of the pons are stable. Vascular: Flow is present in the anterior circulation. Abnormal signal is present in the distal right vertebral artery. Flow is present in the left vertebral artery. Skull and upper cervical spine: The craniocervical junction is normal. Upper cervical spine is within normal limits. Marrow signal is unremarkable. Sinuses/Orbits: Bilateral ethmoid opacification is noted. The paranasal sinuses and mastoid air cells are otherwise clear. The globes and orbits are within normal limits. MRA HEAD FINDINGS Time-of-flight MRA demonstrates normal appearance of the anterior circulation bilaterally. The internal carotid arteries are within normal limits from the high cervical segments through the ICA termini. The A1 and M1 segments are normal. The anterior communicating artery is patent. MCA bifurcations are normal. ACA MCA branch vessels are within normal limits. The left vertebral artery is the dominant vessel. PICA origins are visualized and  normal. There is signal loss in the distal left vertebral artery due to the stent. Signal loss in the distal right vertebral artery is due to occlusion demonstrated on the CTA. Flow is present  in the basilar artery distal to the stent. PCA branch vessels are unremarkable. IMPRESSION: 1. Distal right vertebral artery occlusion. 2. Normal flow in the left vertebral artery and basilar artery proximal and distal to the stent. 3. 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 4. No acute intracranial abnormality. 5. No significant ischemic changes in the supratentorial brain. Electronically Signed   By: San Morelle M.D.   On: 07/28/2018 09:09   Dg Abdomen Acute W/chest  Result Date: 07/28/2018 CLINICAL DATA:  Vomiting, diarrhea, abdominal pain. EXAM: DG ABDOMEN ACUTE W/ 1V CHEST COMPARISON:  Radiographs and CT 11/30/2017 FINDINGS: Vague subpleural opacities at the right lung base. Left lung is clear. Heart is normal in size. Normal mediastinal contours. Minimal apical emphysema prior CT not well appreciated radiographically. No pleural fluid or pneumothorax. Cholecystectomy clips in the right upper quadrant. Scattered air throughout nondilated small and large bowel in a nonobstructive pattern. No evidence of free air. Excreted IV contrast within both renal collecting systems in the urinary bladder from recent head and neck CTA. There are pelvic phleboliths. No acute osseous abnormalities. IMPRESSION: 1. Nonobstructive bowel gas pattern. 2. Vague subpleural opacities at the right lung base, may be atelectasis or scarring, less likely infectious. Electronically Signed   By: Keith Rake M.D.   On: 07/28/2018 01:46   Mr Jodene Nam Head/brain WU Cm  Result Date: 07/28/2018 CLINICAL DATA:  Vertebrobasilar artery syndrome. Left vertebrobasilar stent. Occluded distal right vertebral artery. EXAM: MRI HEAD WITHOUT CONTRAST MRA HEAD WITHOUT CONTRAST TECHNIQUE: Multiplanar, multiecho pulse sequences  of the brain and surrounding structures were obtained without intravenous contrast. Angiographic images of the head were obtained using MRA technique without contrast. COMPARISON:  CTA of the head and neck 07/27/2018. MRI brain 02/09/2018 FINDINGS: MRI HEAD FINDINGS Brain: The diffusion-weighted images demonstrate no acute or subacute infarction. Remote lacunar infarcts of the left cerebellum are again noted. No significant supratentorial white matter disease is present. The ventricles are of normal size. No significant extraaxial fluid collection is present. The internal auditory canals are within normal limits. Remote white matter changes of the pons are stable. Vascular: Flow is present in the anterior circulation. Abnormal signal is present in the distal right vertebral artery. Flow is present in the left vertebral artery. Skull and upper cervical spine: The craniocervical junction is normal. Upper cervical spine is within normal limits. Marrow signal is unremarkable. Sinuses/Orbits: Bilateral ethmoid opacification is noted. The paranasal sinuses and mastoid air cells are otherwise clear. The globes and orbits are within normal limits. MRA HEAD FINDINGS Time-of-flight MRA demonstrates normal appearance of the anterior circulation bilaterally. The internal carotid arteries are within normal limits from the high cervical segments through the ICA termini. The A1 and M1 segments are normal. The anterior communicating artery is patent. MCA bifurcations are normal. ACA MCA branch vessels are within normal limits. The left vertebral artery is the dominant vessel. PICA origins are visualized and normal. There is signal loss in the distal left vertebral artery due to the stent. Signal loss in the distal right vertebral artery is due to occlusion demonstrated on the CTA. Flow is present in the basilar artery distal to the stent. PCA branch vessels are unremarkable. IMPRESSION: 1. Distal right vertebral artery occlusion.  2. Normal flow in the left vertebral artery and basilar artery proximal and distal to the stent. 3. 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 4. No acute intracranial abnormality. 5. No significant ischemic changes in the supratentorial brain. Electronically  Signed   By: San Morelle M.D.   On: 07/28/2018 09:09     Subjective: No issues today. Symptoms improved  Discharge Exam: Vitals:   07/28/18 0900 07/28/18 1300  BP: 105/66 (!) 108/59  Pulse: 67 62  Resp: 20 19  Temp: 97.7 F (36.5 C) 98.5 F (36.9 C)  SpO2: 97% 96%   Vitals:   07/28/18 0422 07/28/18 0515 07/28/18 0900 07/28/18 1300  BP: 121/82  105/66 (!) 108/59  Pulse: 86  67 62  Resp: (!) 23  20 19   Temp:   97.7 F (36.5 C) 98.5 F (36.9 C)  TempSrc:   Oral Oral  SpO2: 96%  97% 96%  Weight:  117.9 kg    Height:  6' 6"  (1.981 m)      General: Pt is alert, awake, not in acute distress Cardiovascular: RRR, S1/S2 +, no rubs, no gallops Respiratory: CTA bilaterally, no wheezing, no rhonchi Abdominal: Soft, NT, ND, bowel sounds + Extremities: no edema, no cyanosis    The results of significant diagnostics from this hospitalization (including imaging, microbiology, ancillary and laboratory) are listed below for reference.     Microbiology: Recent Results (from the past 240 hour(s))  SARS Coronavirus 2 (CEPHEID - Performed in [facility] hospital lab), Hosp Order     Status: None   Collection Time: 07/27/18 11:31 PM  Result Value Ref Range Status   SARS Coronavirus 2 NEGATIVE NEGATIVE Final    Comment: (NOTE) If result is NEGATIVE SARS-CoV-2 target nucleic acids are NOT DETECTED. The SARS-CoV-2 RNA is generally detectable in upper and lower  respiratory specimens during the acute phase of infection. The lowest  concentration of SARS-CoV-2 viral copies this assay can detect is 250  copies / mL. A negative result does not preclude SARS-CoV-2 infection  and should not be used as the sole  basis for treatment or other  patient management decisions.  A negative result may occur with  improper specimen collection / handling, submission of specimen other  than nasopharyngeal swab, presence of viral mutation(s) within the  areas targeted by this assay, and inadequate number of viral copies  (<250 copies / mL). A negative result must be combined with clinical  observations, patient history, and epidemiological information. If result is POSITIVE SARS-CoV-2 target nucleic acids are DETECTED. The SARS-CoV-2 RNA is generally detectable in upper and lower  respiratory specimens dur ing the acute phase of infection.  Positive  results are indicative of active infection with SARS-CoV-2.  Clinical  correlation with patient history and other diagnostic information is  necessary to determine patient infection status.  Positive results do  not rule out bacterial infection or co-infection with other viruses. If result is PRESUMPTIVE POSTIVE SARS-CoV-2 nucleic acids MAY BE PRESENT.   A presumptive positive result was obtained on the submitted specimen  and confirmed on repeat testing.  While 2019 novel coronavirus  (SARS-CoV-2) nucleic acids may be present in the submitted sample  additional confirmatory testing may be necessary for epidemiological  and / or clinical management purposes  to differentiate between  SARS-CoV-2 and other Sarbecovirus currently known to infect humans.  If clinically indicated additional testing with an alternate test  methodology [id number]) is advised. The SARS-CoV-2 RNA is generally  detectable in upper and lower respiratory sp ecimens during the acute  phase of infection. The expected result is Negative. Fact Sheet for Patients:  StrictlyIdeas.no Fact Sheet for Healthcare Providers: BankingDealers.co.za This test is not yet approved or cleared by the [place]  States FDA and has been authorized for detection  and/or diagnosis of SARS-CoV-2 by FDA under an Emergency Use Authorization (EUA).  This EUA will remain in effect (meaning this test can be used) for the duration of the COVID-19 declaration under Section 564(b)(1) of the Act, 21 U.S.C. section 360bbb-3(b)(1), unless the authorization is terminated or revoked sooner. Performed at [facility], [address], [place], [place] [address]      Labs: BNP (last 3 results) No results for input(s): BNP in the last 8760 hours. Basic Metabolic Panel: Recent Labs  Lab 07/27/18 2225  NA 139  K 4.6  CL 102  CO2 23  GLUCOSE 242*  BUN 21*  CREATININE 1.38*  CALCIUM 10.0  MG 1.7   Liver Function Tests: Recent Labs  Lab 07/27/18 2225  AST 30  ALT 40  ALKPHOS 91  BILITOT 1.0  PROT 8.5*  ALBUMIN 4.5   Recent Labs  Lab 07/27/18 2225  LIPASE 26   No results for input(s): AMMONIA in the last 168 hours. CBC: Recent Labs  Lab 07/27/18 2225  WBC 13.1*  HGB 16.2  HCT 49.5  MCV 86.2  PLT 342   Cardiac Enzymes: Recent Labs  Lab 07/27/18 2226  CKTOTAL 86  TROPONINI <0.03   BNP: Invalid input(s): POCBNP CBG: Recent Labs  Lab 07/27/18 2221 07/28/18 0630  GLUCAP 252* 144*   D-Dimer No results for input(s): DDIMER in the last 72 hours. Hgb A1c No results for input(s): HGBA1C in the last 72 hours. Lipid Profile No results for input(s): CHOL, HDL, LDLCALC, TRIG, CHOLHDL, LDLDIRECT in the last 72 hours. Thyroid function studies No results for input(s): TSH, T4TOTAL, T3FREE, THYROIDAB in the last 72 hours.  Invalid input(s): FREET3 Anemia work up No results for input(s): VITAMINB12, FOLATE, FERRITIN, TIBC, IRON, RETICCTPCT in the last 72 hours. Urinalysis    Component Value Date/Time   COLORURINE YELLOW 07/28/2018 1252   APPEARANCEUR CLEAR 07/28/2018 1252   LABSPEC >1.046 (H) 07/28/2018 1252   PHURINE 5.0 07/28/2018 1252   GLUCOSEU NEGATIVE 07/28/2018 1252   HGBUR NEGATIVE 07/28/2018 1252   [place]  07/28/2018 1252   KETONESUR NEGATIVE 07/28/2018 1252   PROTEINUR NEGATIVE 07/28/2018 1252   UROBILINOGEN 0.2 10/06/2013 0305   NITRITE NEGATIVE 07/28/2018 1252   LEUKOCYTESUR NEGATIVE 07/28/2018 1252   Sepsis Labs Invalid input(s): PROCALCITONIN,  WBC,  LACTICIDVEN Microbiology Recent Results (from the past 240 hour(s))  SARS Coronavirus 2 (CEPHEID - Performed in [place] hospital lab), Hosp Order     Status: None   Collection Time: 07/27/18 11:31 PM  Result Value Ref Range Status   SARS Coronavirus 2 NEGATIVE NEGATIVE Final    Comment: (NOTE) If result is NEGATIVE SARS-CoV-2 target nucleic acids are NOT DETECTED. The SARS-CoV-2 RNA is generally detectable in upper and lower  respiratory specimens during the acute phase of infection. The lowest  concentration of SARS-CoV-2 viral copies this assay can detect is 250  copies / mL. A negative result does not preclude SARS-CoV-2 infection  and should not be used as the sole basis for treatment or other  patient management decisions.  A negative result may occur with  improper specimen collection / handling, submission of specimen other  than nasopharyngeal swab, presence of viral mutation(s) within the  areas targeted by this assay, and inadequate number of viral copies  (<250 copies / mL). A negative result must be combined with clinical  observations, patient history, and epidemiological information. If result is POSITIVE SARS-CoV-2 target  nucleic acids are DETECTED. The SARS-CoV-2 RNA is generally detectable in upper and lower  respiratory specimens dur ing the acute phase of infection.  Positive  results are indicative of active infection with SARS-CoV-2.  Clinical  correlation with patient history and other diagnostic information is  necessary to determine patient infection status.  Positive results do  not rule out bacterial infection or co-infection with other viruses. If result is PRESUMPTIVE POSTIVE SARS-CoV-2 nucleic  acids MAY BE PRESENT.   A presumptive positive result was obtained on the submitted specimen  and confirmed on repeat testing.  While 2019 novel coronavirus  (SARS-CoV-2) nucleic acids may be present in the submitted sample  additional confirmatory testing may be necessary for epidemiological  and / or clinical management purposes  to differentiate between  SARS-CoV-2 and other Sarbecovirus currently known to infect humans.  If clinically indicated additional testing with an alternate test  methodology [phone]) is advised. The SARS-CoV-2 RNA is generally  detectable in upper and lower respiratory sp ecimens during the acute  phase of infection. The expected result is Negative. Fact Sheet for Patients:  StrictlyIdeas.no Fact Sheet for Healthcare Providers: BankingDealers.co.za This test is not yet approved or cleared by the [place] FDA and has been authorized for detection and/or diagnosis of SARS-CoV-2 by FDA under an Emergency Use Authorization (EUA).  This EUA will remain in effect (meaning this test can be used) for the duration of the COVID-19 declaration under Section 564(b)(1) of the Act, 21 U.S.C. section 360bbb-3(b)(1), unless the authorization is terminated or revoked sooner. Performed at [facility], [address], [place],  [address]      SIGNED:   Cordelia Poche, MD [facility] 07/28/2018, 2:03 PM

## 2018-07-29 ENCOUNTER — Other Ambulatory Visit: Payer: Self-pay

## 2018-07-29 ENCOUNTER — Encounter (HOSPITAL_COMMUNITY): Payer: Self-pay | Admitting: Emergency Medicine

## 2018-07-29 DIAGNOSIS — I2510 Atherosclerotic heart disease of native coronary artery without angina pectoris: Secondary | ICD-10-CM | POA: Insufficient documentation

## 2018-07-29 DIAGNOSIS — R197 Diarrhea, unspecified: Secondary | ICD-10-CM | POA: Insufficient documentation

## 2018-07-29 DIAGNOSIS — F17210 Nicotine dependence, cigarettes, uncomplicated: Secondary | ICD-10-CM | POA: Insufficient documentation

## 2018-07-29 DIAGNOSIS — I10 Essential (primary) hypertension: Secondary | ICD-10-CM | POA: Insufficient documentation

## 2018-07-29 DIAGNOSIS — Z79899 Other long term (current) drug therapy: Secondary | ICD-10-CM | POA: Insufficient documentation

## 2018-07-29 DIAGNOSIS — R1110 Vomiting, unspecified: Secondary | ICD-10-CM | POA: Insufficient documentation

## 2018-07-29 LAB — CBC
HCT: 49.1 % (ref 39.0–52.0)
Hemoglobin: 16 g/dL (ref 13.0–17.0)
MCH: 28.3 pg (ref 26.0–34.0)
MCHC: 32.6 g/dL (ref 30.0–36.0)
MCV: 86.7 fL (ref 80.0–100.0)
Platelets: 338 10*3/uL (ref 150–400)
RBC: 5.66 MIL/uL (ref 4.22–5.81)
RDW: 14.7 % (ref 11.5–15.5)
WBC: 12.4 10*3/uL — ABNORMAL HIGH (ref 4.0–10.5)
nRBC: 0 % (ref 0.0–0.2)

## 2018-07-29 LAB — COMPREHENSIVE METABOLIC PANEL
ALT: 40 U/L (ref 0–44)
AST: 29 U/L (ref 15–41)
Albumin: 4.6 g/dL (ref 3.5–5.0)
Alkaline Phosphatase: 101 U/L (ref 38–126)
Anion gap: 14 (ref 5–15)
BUN: 15 mg/dL (ref 6–20)
CO2: 25 mmol/L (ref 22–32)
Calcium: 9.9 mg/dL (ref 8.9–10.3)
Chloride: 100 mmol/L (ref 98–111)
Creatinine, Ser: 0.98 mg/dL (ref 0.61–1.24)
GFR calc Af Amer: >60 mL/min (ref >60)
GFR calc non Af Amer: >60 mL/min (ref >60)
Glucose, Bld: 195 mg/dL — ABNORMAL HIGH (ref 70–99)
Potassium: 3.8 mmol/L (ref 3.5–5.1)
Sodium: 139 mmol/L (ref 135–145)
Total Bilirubin: 0.6 mg/dL (ref 0.3–1.2)
Total Protein: 9 g/dL — ABNORMAL HIGH (ref 6.5–8.1)

## 2018-07-29 LAB — LIPASE, BLOOD: Lipase: 29 U/L (ref 11–51)

## 2018-07-29 MED ORDER — SODIUM CHLORIDE 0.9% FLUSH: 3.0000 mL | Freq: Once | INTRAVENOUS | Status: DC

## 2018-07-29 NOTE — ED Triage Notes (Signed)
Pt c/o emesis and diarrhea since this am. Pt was [facility]c`d [facility] for [facility] same 5/16.

## 2018-07-30 ENCOUNTER — Emergency Department (HOSPITAL_COMMUNITY)
Admission: EM | Admit: 2018-07-30 | Discharge: 2018-07-30 | Disposition: A | Discharge status: Home / Self Care | Payer: Medicaid Other | Attending: Emergency Medicine | Admitting: Emergency Medicine

## 2018-07-30 DIAGNOSIS — R197 Diarrhea, unspecified: Secondary | ICD-10-CM

## 2018-07-30 DIAGNOSIS — R1110 Vomiting, unspecified: Secondary | ICD-10-CM

## 2018-07-30 MED ORDER — PROMETHAZINE HCL 25 MG PO TABS: 25.0000 mg | ORAL_TABLET | Freq: Four times a day (QID) | ORAL | 0 refills | Status: DC | PRN

## 2018-07-30 NOTE — ED Provider Notes (Signed)
[facility] EMERGENCY DEPARTMENT Provider Note   CSN: [id number] Arrival date & time: 07/29/18  2209    History   Chief Complaint Chief Complaint  Patient presents with  . Emesis    HPI Devin [name] is a 48 y.o. male.     The history is provided by the patient.  Emesis  Severity:  Moderate Duration:  8 days Timing:  Intermittent Progression:  Worsening Chronicity:  New Relieved by:  Nothing Worsened by:  Nothing Associated symptoms: abdominal pain and diarrhea   Associated symptoms: no cough and no fever   Risk factors: no suspect food intake    Patient with history of CAD, previous stroke presents with vomiting/diarrhea.  He reports it started over 8 days ago.  No fever, no blood in vomit or stool.  He reports abdominal pain due to vomiting.  No chest pain or shortness of breath.  No cough.  He was admitted past week with the symptoms and was also worked up for stroke.  No new strokes were noted. He reports the diarrhea continues and is clear.  No antibiotics.  No travel. Past Medical History:  Diagnosis Date  . Asthma   . Chest pain   . Coronary artery disease    a. s/p DES x2 to LAD and DES to OM in 11/2017  . Depression   . Dyspnea   . GERD (gastroesophageal reflux disease)   . Headache   . History of kidney stones   . History of rhabdomyolysis   . Hyperlipidemia   . Hypertension   . Hypothyroidism   . Ischemic cardiomyopathy    a. 11/2017: echo showing EF of 35-40%, diffuse HK, and Grade 2 DD  . Kidney calculus 2014  . Pericardial effusion    Small, by dobutamine echocardiogram, 04/2006  . PONV (postoperative nausea and vomiting)   . Sleep apnea    does not wear cpap  . Stroke ([place])   . Tobacco abuse   . Type 2 diabetes mellitus without complications ([place]) 7/82/9562  . Vitamin D deficiency     Patient Active Problem List   Diagnosis Date Noted  . 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 Acute cerebrovascular accident (CVA) ([place]) 02/10/2018  . Chronic combined systolic and diastolic CHF (congestive heart failure) ([place]) 01/06/2018  . Altered mental status 01/05/2018  . History of stroke 01/05/2018  . 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 Cerebellar stroke ([place])   . TIA (transient ischemic attack) 11/30/2017  . Abnormal cardiac CT angiography   . Coronary artery disease   . Chest pain due to CAD ([place]) 11/14/2017  . Chest pain with moderate risk for cardiac etiology   . CKD (chronic kidney disease) 11/13/2017  . Hypothyroidism 10/07/2013  . Type 2 diabetes mellitus without complications ([place]) 13/10/6576  . Acute renal failure ([place]) 10/06/2013  . Elevated AST (SGOT) 10/06/2013  . Elevated ALT measurement 10/06/2013  . Chest pain   . Tobacco abuse   . Hypertension   . Hyperlipidemia   . Pericardial effusion   . History of rhabdomyolysis     Past Surgical History:  Procedure Laterality Date  . APPENDECTOMY    . 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 CHOLECYSTECTOMY N/A 01/27/2013   Procedure: LAPAROSCOPIC CHOLECYSTECTOMY;  Surgeon: Jamesetta So, MD;  Location: [facility];  Service: General;  Laterality: N/A;  .  CORONARY STENT INTERVENTION N/A 11/15/2017   Procedure: CORONARY STENT INTERVENTION;  Surgeon: Leonie Man, MD;  Location: [facility];  Service: Cardiovascular;  Laterality: N/A;  . HERNIA REPAIR     As a child  . INSERTION OF MESH N/A 11/13/2012   Procedure: INSERTION OF MESH;  Surgeon: Jamesetta So, MD;  Location: [facility];  Service: General;  Laterality: N/A;  . IR ANGIO INTRA EXTRACRAN SEL COM CAROTID INNOMINATE BILAT MOD SED  12/01/2017  . IR ANGIO INTRA EXTRACRAN SEL COM CAROTID INNOMINATE BILAT MOD SED  02/13/2018  . IR ANGIO VERTEBRAL SEL SUBCLAVIAN INNOMINATE UNI L MOD SED  02/13/2018  . IR ANGIO VERTEBRAL SEL VERTEBRAL BILAT MOD  SED  12/01/2017  . IR ANGIO VERTEBRAL SEL VERTEBRAL UNI R MOD SED  02/13/2018  . IR INTRA CRAN STENT  12/03/2017  . IR PTA INTRACRANIAL  02/25/2018  . IR US GUIDE VASC ACCESS RIGHT  02/13/2018  . KIDNEY STONE SURGERY    . LEFT HEART CATH AND CORONARY ANGIOGRAPHY N/A 11/15/2017   Procedure: LEFT HEART CATH AND CORONARY ANGIOGRAPHY;  Surgeon: Leonie Man, MD;  Location: [facility];  Service: Cardiovascular;  Laterality: N/A;  . PERCUTANEOUS NEPHROLITHOTRIPSY    . RADIOLOGY WITH ANESTHESIA Left 12/03/2017   Procedure: Angioplasty with possible stenting of left VBJ;  Surgeon: Luanne Bras, MD;  Location: [facility];  Service: Radiology;  Laterality: Left;  . RADIOLOGY WITH ANESTHESIA N/A 02/25/2018   Procedure: STENT PLACEMENT;  Surgeon: Luanne Bras, MD;  Location: [place];  Service: Radiology;  Laterality: N/A;  . UMBILICAL HERNIA REPAIR N/A 11/13/2012   Procedure: UMBILICAL HERNIORRHAPHY;  Surgeon: Jamesetta So, MD;  Location: [facility];  Service: General;  Laterality: N/A;  . VARICOCELECTOMY          Home Medications    Prior to Admission medications   Medication Sig Start Date End Date Taking? Authorizing Provider  aspirin 81 MG EC tablet Take 1 tablet (81 mg total) by mouth daily. With food 12/05/17   Roxan Hockey, MD  Blood Glucose Monitoring Suppl w/Device KIT Dispense based on patient and insurance preference. Check blood sugar daily before breakfast (ICD9 250.0) 04/29/17   Pollina, Gwenyth Allegra, MD  citalopram (CELEXA) 10 MG tablet Take 1 tablet (10 mg total) by mouth daily. 01/04/18   Venancio Poisson, NP  EUTHYROX 200 MCG tablet Take 200 mcg by mouth daily. 07/20/18   [provider]  EUTHYROX 50 MCG tablet Take 50 mcg by mouth daily. 07/20/18   [provider]  ezetimibe (ZETIA) 10 MG tablet Take 1 tablet (10 mg total) by mouth daily. 12/06/17   Roxan Hockey, MD  fenofibrate 160 MG tablet Take 1 tablet (160 mg total) by mouth daily. 02/13/18    Rai, Vernelle Emerald, MD  gabapentin (NEURONTIN) 300 MG capsule Take 1 capsule (300 mg total) by mouth 2 (two) times daily. 01/04/18   Venancio Poisson, NP  glimepiride (AMARYL) 2 MG tablet Take 1 tablet (2 mg total) by mouth daily with breakfast. 11/16/17   Patrecia Pour, MD  losartan (COZAAR) 25 MG tablet Take 0.5 tablets (12.5 mg total) by mouth daily. 01/15/18   Strader, Fransisco Hertz, PA-C  nitroGLYCERIN (NITROSTAT) 0.4 MG SL tablet Place 1 tablet (0.4 mg total) under the tongue every 5 (five) minutes as needed for chest pain. 01/15/18   Strader, Fransisco Hertz, PA-C  pantoprazole (PROTONIX) 40 MG tablet Take 40 mg by mouth every morning.  [provider]  promethazine (PHENERGAN) 25 MG tablet Take 1 tablet (25 mg total) by mouth every 6 (six) hours as needed for nausea or vomiting. 07/30/18   Ripley Fraise, MD  ticagrelor (BRILINTA) 90 MG TABS tablet Take 1 tablet (90 mg total) by mouth 2 (two) times daily. 07/29/18   Mariel Aloe, MD    Family History Family History  Problem Relation Age of Onset  . Coronary artery disease Father   . Hypertension Father   . Hyperlipidemia Father   . Diabetes Father   . Congestive Heart Failure Father 82  . Arrhythmia Father        had an ICD  . Diabetes Mother   . Hypertension Mother   . Hyperlipidemia Mother   . Obesity Mother 43       died after bariatric surgery, liver failure and infection  . Coronary artery disease Maternal Grandfather        both grandfathers and several uncles  . Coronary artery disease Other   . Diabetes Sister        both sisters  . Stroke Maternal Aunt   . Stroke Maternal Uncle     Social History Social History   Tobacco Use  . Smoking status: Current Some Day Smoker    Packs/day: 0.25    Years: 26.00    Pack years: 6.50    Types: Cigarettes    Start date: 03/13/1985  . Smokeless tobacco: Former User    Types: Snuff, Sarina Ser    Quit date: 03/14/1987  Substance Use Topics  . Alcohol use: No  . Drug use: No      Allergies   Crestor [rosuvastatin] and Bee venom   Review of Systems Review of Systems  Constitutional: Negative for fever.  Respiratory: Negative for cough.   Gastrointestinal: Positive for abdominal pain, diarrhea and vomiting. Negative for blood in stool.  All other systems reviewed and are negative.    Physical Exam Updated Vital Signs BP (!) 133/97   Pulse 83   Temp 98 F (36.7 C) (Oral)   Resp 18   SpO2 94%   Physical Exam  CONSTITUTIONAL: Well developed/well nourished HEAD: Normocephalic/atraumatic EYES: EOMI/PERRL, no icterus NECK: supple no meningeal signs SPINE/BACK:entire spine nontender CV: S1/S2 noted, no murmurs/rubs/gallops noted LUNGS: Lungs are clear to auscultation bilaterally, no apparent distress ABDOMEN: soft, nontender, no rebound or guarding, bowel sounds noted throughout abdomen GU:no cva tenderness NEURO: Pt is awake/alert/appropriate, moves all extremitiesx4.  No facial droop.   EXTREMITIES: pulses normal/equal, full ROM SKIN: warm, color normal PSYCH: no abnormalities of mood noted, alert and oriented to situation  ED Treatments / Results  Labs (all labs ordered are listed, but only abnormal results are displayed) Labs Reviewed  COMPREHENSIVE METABOLIC PANEL - Abnormal; Notable for the following components:      Result Value   Glucose, Bld 195 (*)    Total Protein 9.0 (*)    All other components within normal limits  CBC - Abnormal; Notable for the following components:   WBC 12.4 (*)    All other components within normal limits  GASTROINTESTINAL PANEL BY PCR, STOOL (REPLACES STOOL CULTURE)  C DIFFICILE QUICK SCREEN W PCR REFLEX  LIPASE, BLOOD    EKG None  Radiology Mr Brain Wo Contrast  Result Date: 07/28/2018 CLINICAL DATA:  Vertebrobasilar artery syndrome. Left vertebrobasilar stent. Occluded distal right vertebral artery. EXAM: MRI HEAD WITHOUT CONTRAST MRA HEAD WITHOUT CONTRAST TECHNIQUE: Multiplanar, multiecho  pulse sequences of the brain  and surrounding structures were obtained without intravenous contrast. Angiographic images of the head were obtained using MRA technique without contrast. COMPARISON:  CTA of the head and neck 07/27/2018. MRI brain 02/09/2018 FINDINGS: MRI HEAD FINDINGS Brain: The diffusion-weighted images demonstrate no acute or subacute infarction. Remote lacunar infarcts of the left cerebellum are again noted. No significant supratentorial white matter disease is present. The ventricles are of normal size. No significant extraaxial fluid collection is present. The internal auditory canals are within normal limits. Remote white matter changes of the pons are stable. Vascular: Flow is present in the anterior circulation. Abnormal signal is present in the distal right vertebral artery. Flow is present in the left vertebral artery. Skull and upper cervical spine: The craniocervical junction is normal. Upper cervical spine is within normal limits. Marrow signal is unremarkable. Sinuses/Orbits: Bilateral ethmoid opacification is noted. The paranasal sinuses and mastoid air cells are otherwise clear. The globes and orbits are within normal limits. MRA HEAD FINDINGS Time-of-flight MRA demonstrates normal appearance of the anterior circulation bilaterally. The internal carotid arteries are within normal limits from the high cervical segments through the ICA termini. The A1 and M1 segments are normal. The anterior communicating artery is patent. MCA bifurcations are normal. ACA MCA branch vessels are within normal limits. The left vertebral artery is the dominant vessel. PICA origins are visualized and normal. There is signal loss in the distal left vertebral artery due to the stent. Signal loss in the distal right vertebral artery is due to occlusion demonstrated on the CTA. Flow is present in the basilar artery distal to the stent. PCA branch vessels are unremarkable. IMPRESSION: 1. Distal right vertebral  artery occlusion. 2. Normal flow in the left vertebral artery and basilar artery proximal and distal to the stent. 3. 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 4. No acute intracranial abnormality. 5. No significant ischemic changes in the supratentorial brain. Electronically Signed   By: San Morelle M.D.   On: 07/28/2018 09:09   Mr Jodene Nam Head/brain IW Cm  Result Date: 07/28/2018 CLINICAL DATA:  Vertebrobasilar artery syndrome. Left vertebrobasilar stent. Occluded distal right vertebral artery. EXAM: MRI HEAD WITHOUT CONTRAST MRA HEAD WITHOUT CONTRAST TECHNIQUE: Multiplanar, multiecho pulse sequences of the brain and surrounding structures were obtained without intravenous contrast. Angiographic images of the head were obtained using MRA technique without contrast. COMPARISON:  CTA of the head and neck 07/27/2018. MRI brain 02/09/2018 FINDINGS: MRI HEAD FINDINGS Brain: The diffusion-weighted images demonstrate no acute or subacute infarction. Remote lacunar infarcts of the left cerebellum are again noted. No significant supratentorial white matter disease is present. The ventricles are of normal size. No significant extraaxial fluid collection is present. The internal auditory canals are within normal limits. Remote white matter changes of the pons are stable. Vascular: Flow is present in the anterior circulation. Abnormal signal is present in the distal right vertebral artery. Flow is present in the left vertebral artery. Skull and upper cervical spine: The craniocervical junction is normal. Upper cervical spine is within normal limits. Marrow signal is unremarkable. Sinuses/Orbits: Bilateral ethmoid opacification is noted. The paranasal sinuses and mastoid air cells are otherwise clear. The globes and orbits are within normal limits. MRA HEAD FINDINGS Time-of-flight MRA demonstrates normal appearance of the anterior circulation bilaterally. The internal carotid arteries are within  normal limits from the high cervical segments through the ICA termini. The A1 and M1 segments are normal. The anterior communicating artery is patent. MCA bifurcations are normal. ACA MCA  branch vessels are within normal limits. The left vertebral artery is the dominant vessel. PICA origins are visualized and normal. There is signal loss in the distal left vertebral artery due to the stent. Signal loss in the distal right vertebral artery is due to occlusion demonstrated on the CTA. Flow is present in the basilar artery distal to the stent. PCA branch vessels are unremarkable. IMPRESSION: 1. Distal right vertebral artery occlusion. 2. Normal flow in the left vertebral artery and basilar artery proximal and distal to the stent. 3. 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 4. No acute intracranial abnormality. 5. No significant ischemic changes in the supratentorial brain. Electronically Signed   By: San Morelle M.D.   On: 07/28/2018 09:09    Procedures Procedures (including critical care time)  Medications Ordered in ED Medications  sodium chloride flush (NS) 0.9 % injection 3 mL (3 mLs Intravenous Not Given 07/30/18 0123)     Initial Impression / Assessment and Plan / ED Course  I have reviewed the triage vital signs and the nursing notes.  Pertinent labs  results that were available during my care of the patient were reviewed by me and considered in my medical decision making (see chart for details).        Recently admitted for vomiting and had a work-up for stroke.  Stroke work-up was negative, patient reports continued vomiting and diarrhea for up to 8 days.  He is afebrile, he is not septic appearing.  No focal abdominal tenderness.  Labs reassuring.  Due to length of time, will advise patient to have stool culture.  He was unable to stool here.  This can be followed up on by his PCP.  Final Clinical Impressions(s) / ED Diagnoses   Final diagnoses:   Vomiting and diarrhea    ED Discharge Orders         Ordered    promethazine (PHENERGAN) 25 MG tablet  Every 6 hours PRN     07/30/18 0143           Ripley Fraise, MD 07/30/18 0225

## 2018-07-30 NOTE — ED Notes (Signed)
Pt states he cannot give urine specimen yet.

## 2018-09-12 ENCOUNTER — Ambulatory Visit: Payer: Medicaid Other | Admitting: Cardiology

## 2018-10-02 DIAGNOSIS — I255 Ischemic cardiomyopathy: Secondary | ICD-10-CM | POA: Insufficient documentation

## 2018-10-02 NOTE — Progress Notes (Signed)
Cardiology Office Note    Date:  10/07/2018   ID:  Devin [name], DOB 12-30-1970, MRN [id number]  PCP:  [facility]  Cardiologist: Carlyle Dolly, MD EPS: None  No chief complaint on file.   History of Present Illness:  Devin [name] is a 48 y.o. male with history of CAD coronary CTA 11/2017 with severe proximal LAD disease and possible mid LAD and diagonal disease.  Underwent cardiac cath 11/2017 left main patent, 80 to 90% mid LAD, 90% distal circumflex, 90% OM 2, 90% PDA, RCA small with proximal 99% LV gram 35 to 45%.  He underwent DES to the LAD and DES to the OM. Medical therapy recommended for the other vessels that were small and poor targets.  Echo 01/2018 LVEF 35 to 40% grade 1 DD, history of bradycardia on beta-blockers, history of rhabdomyolysis on Crestor and did not tolerate Entresto because of low blood pressures and dizziness.  Had a telemedicine visit with Dr. Harl Bowie 07/26/2018 at which time he was complaining of shortness of breath and dyspnea on exertion that had been unchanged for the past 10 to 15 years.  He initially felt good after his stents but has declined since.  Symptoms could be secondary to deconditioning 2 strokes and severe hypothyroidism.  Repeat cardiac testing due to self-pay was deferred.  Once he gets disability consider stress testing.  Brilinta changed to Plavix due to shortness of breath. HLD on Zetia and fenofibrate with triglycerides over 700 felt secondary to poorly controlled diabetes and severe hypothyroidism.  History of CVA treated with revascularization of his vertebrobasilar junction/proximal basilar artery stenosis using stent assisted angioplasty 11/2017 later had ISR requiring repeat procedure 02/2018.  Complains of cramps in center of chest down both arms with-feels like muscle cramps, like whole body is drawing in.occurs at rest and with exertion. Similar to his prior chest pain but not as bad. Eases within 20 min. Comes  and goes. Tried pedialyte and gatorade without relief but hasn't tried NTG. Also dyspnea with walking across room. PCP stopped all his meds except brillinta because of dizziness and diarrhea, and leg cramps and gradually started each back. He is no longer on losartan or trulicity and those symptoms improved.He thinks things have gotten much worse over the past 3 weeks and wants something done. Still smoking 1/2 ppd down from 2ppd.  Past Medical History:  Diagnosis Date  . Asthma   . Chest pain   . Coronary artery disease    a. s/p DES x2 to LAD and DES to OM in 11/2017  . Depression   . Dyspnea   . GERD (gastroesophageal reflux disease)   . Headache   . History of kidney stones   . History of rhabdomyolysis   . Hyperlipidemia   . Hypertension   . Hypothyroidism   . Ischemic cardiomyopathy    a. 11/2017: echo showing EF of 35-40%, diffuse HK, and Grade 2 DD  . Kidney calculus 2014  . Pericardial effusion    Small, by dobutamine echocardiogram, 04/2006  . PONV (postoperative nausea and vomiting)   . Sleep apnea    does not wear cpap  . Stroke ([place])   . Tobacco abuse   . Type 2 diabetes mellitus without complications ([place]) 5/45/6256  . Vitamin D deficiency     Past Surgical History:  Procedure Laterality Date  . APPENDECTOMY    . CARDIAC CATHETERIZATION  09/2012   "Nonobstructive CAD with 30% proximal, 40% mid LAD disease;  30% proximal CFX; EF 55-65%"  . CHOLECYSTECTOMY N/A 01/27/2013   Procedure: LAPAROSCOPIC CHOLECYSTECTOMY;  Surgeon: Jamesetta So, MD;  Location: [facility];  Service: General;  Laterality: N/A;  . CORONARY STENT INTERVENTION N/A 11/15/2017   Procedure: CORONARY STENT INTERVENTION;  Surgeon: Leonie Man, MD;  Location: [facility];  Service: Cardiovascular;  Laterality: N/A;  . HERNIA REPAIR     As a child  . INSERTION OF MESH N/A 11/13/2012   Procedure: INSERTION OF MESH;  Surgeon: Jamesetta So, MD;  Location: [facility];  Service: General;  Laterality:  N/A;  . IR ANGIO INTRA EXTRACRAN SEL COM CAROTID INNOMINATE BILAT MOD SED  12/01/2017  . IR ANGIO INTRA EXTRACRAN SEL COM CAROTID INNOMINATE BILAT MOD SED  02/13/2018  . IR ANGIO VERTEBRAL SEL SUBCLAVIAN INNOMINATE UNI L MOD SED  02/13/2018  . IR ANGIO VERTEBRAL SEL VERTEBRAL BILAT MOD SED  12/01/2017  . IR ANGIO VERTEBRAL SEL VERTEBRAL UNI R MOD SED  02/13/2018  . IR INTRA CRAN STENT  12/03/2017  . IR PTA INTRACRANIAL  02/25/2018  . IR US GUIDE VASC ACCESS RIGHT  02/13/2018  . KIDNEY STONE SURGERY    . LEFT HEART CATH AND CORONARY ANGIOGRAPHY N/A 11/15/2017   Procedure: LEFT HEART CATH AND CORONARY ANGIOGRAPHY;  Surgeon: Leonie Man, MD;  Location: [facility];  Service: Cardiovascular;  Laterality: N/A;  . PERCUTANEOUS NEPHROLITHOTRIPSY    . RADIOLOGY WITH ANESTHESIA Left 12/03/2017   Procedure: Angioplasty with possible stenting of left VBJ;  Surgeon: Luanne Bras, MD;  Location: [facility];  Service: Radiology;  Laterality: Left;  . RADIOLOGY WITH ANESTHESIA N/A 02/25/2018   Procedure: STENT PLACEMENT;  Surgeon: Luanne Bras, MD;  Location: [facility];  Service: Radiology;  Laterality: N/A;  . UMBILICAL HERNIA REPAIR N/A 11/13/2012   Procedure: UMBILICAL HERNIORRHAPHY;  Surgeon: Jamesetta So, MD;  Location: [facility];  Service: General;  Laterality: N/A;  . VARICOCELECTOMY      Current Medications: Current Meds  Medication Sig  . aspirin 81 MG EC tablet Take 1 tablet (81 mg total) by mouth daily. With food  . Blood Glucose Monitoring Suppl w/Device KIT Dispense based on patient and insurance preference. Check blood sugar daily before breakfast (ICD9 250.0)  . citalopram (CELEXA) 10 MG tablet Take 1 tablet (10 mg total) by mouth daily.  . EUTHYROX 200 MCG tablet Take 200 mcg by mouth daily.  Arna Medici 50 MCG tablet Take 50 mcg by mouth daily.  Marland Kitchen ezetimibe (ZETIA) 10 MG tablet Take 1 tablet (10 mg total) by mouth daily.  . fenofibrate 160 MG tablet Take 1 tablet (160 mg total) by  mouth daily.  Marland Kitchen gabapentin (NEURONTIN) 300 MG capsule Take 1 capsule (300 mg total) by mouth 2 (two) times daily.  Marland Kitchen glimepiride (AMARYL) 2 MG tablet Take 1 tablet (2 mg total) by mouth daily with breakfast.  . losartan (COZAAR) 25 MG tablet Take 0.5 tablets (12.5 mg total) by mouth daily.  . nitroGLYCERIN (NITROSTAT) 0.4 MG SL tablet Place 1 tablet (0.4 mg total) under the tongue every 5 (five) minutes as needed for chest pain.  . pantoprazole (PROTONIX) 40 MG tablet Take 40 mg by mouth every morning.  . promethazine (PHENERGAN) 25 MG tablet Take 1 tablet (25 mg total) by mouth every 6 (six) hours as needed for nausea or vomiting.  . ticagrelor (BRILINTA) 90 MG TABS tablet Take 1 tablet (90 mg total) by mouth 2 (two) times daily.  Allergies:   Crestor [rosuvastatin] and Bee venom   Social History   Socioeconomic History  . Marital status: Divorced    Spouse name: Not on file  . Number of children: Not on file  . Years of education: Not on file  . Highest education level: Not on file  Occupational History    Comment:      Employer: [employer]  Social Needs  . Financial resource strain: Not on file  . Food insecurity    Worry: Not on file    Inability: Not on file  . Transportation needs    Medical: Not on file    Non-medical: Not on file  Tobacco Use  . Smoking status: Current Some Day Smoker    Packs/day: 0.25    Years: 26.00    Pack years: 6.50    Types: Cigarettes    Start date: 03/13/1985  . Smokeless tobacco: Former Systems developer    Types: Snuff, Sarina Ser    Quit date: 03/14/1987  Substance and Sexual Activity  . Alcohol use: No  . Drug use: No  . Sexual activity: Not on file  Lifestyle  . Physical activity    Days per week: Not on file    Minutes per session: Not on file  . Stress: Not on file  Relationships  . Social Herbalist on phone: Not on file    Gets together: Not on file    Attends religious service: Not on file    Active member of club or  organization: Not on file    Attends meetings of clubs or organizations: Not on file    Relationship status: Not on file  Other Topics Concern  . Not on file  Social History Narrative   Eats fast food daily   No exercise outside of work   Lives in [place] with his dog   MAINTENANCE TECHNICIAN     Family History:  The patient's   family history includes Arrhythmia in his father; Congestive Heart Failure (age of onset: 51) in his father; Coronary artery disease in his father, maternal grandfather, and another family member; Diabetes in his father, mother, and sister; Hyperlipidemia in his father and mother; Hypertension in his father and mother; Obesity (age of onset: 27) in his mother; Stroke in his maternal aunt and maternal uncle.   ROS:   Please see the history of present illness.    ROS All other systems reviewed and are negative.   PHYSICAL EXAM:   VS:  BP (!) 143/94   Pulse 89   Temp 97.9 F (36.6 C) (Temporal)   Ht 6' 6"  (1.981 m)   Wt 267 lb (121.1 kg)   SpO2 96%   BMI 30.85 kg/m   Physical Exam  GEN: obese, in no acute distress  Neck: no JVD, carotid bruits, or masses Cardiac:RRR; no murmurs, rubs, or gallops  Respiratory:  Decreased breath sounds but clear to auscultation bilaterally, normal work of breathing GI: soft, nontender, nondistended, + BS Ext: without cyanosis, clubbing, or edema, Good distal pulses bilaterally Neuro:  Alert and Oriented x 3 Psych: euthymic mood, full affect  Wt Readings from Last 3 Encounters:  10/07/18 267 lb (121.1 kg)  07/28/18 259 lb 14.8 oz (117.9 kg)  07/26/18 269 lb (122 kg)      Studies/Labs Reviewed:   EKG:  EKG is  ordered today.  The ekg ordered today demonstrates normal sinus rhythm with ST-T wave abnormality inferior lateral new from 01/2018-reviewed with Dr.  McDowell.  See below  Recent Labs: 01/05/2018: TSH 60.657 07/27/2018: Magnesium 1.7 07/29/2018: ALT 40; BUN 15; Creatinine, Ser 0.98; Hemoglobin 16.0; Platelets  338; Potassium 3.8; Sodium 139   Lipid Panel    Component Value Date/Time   CHOL 211 (H) 02/11/2018 0615   TRIG 391 (H) 02/11/2018 0615   HDL 25 (L) 02/11/2018 0615   CHOLHDL 8.4 02/11/2018 0615   VLDL 78 (H) 02/11/2018 0615   LDLCALC 108 (H) 02/11/2018 0615    Additional studies/ records that were reviewed today include:     11/2017 cath  There is moderate left ventricular systolic dysfunction. The left ventricular ejection fraction is 35-45% by visual estimate.  LV end diastolic pressure is mildly elevated.  -----------------------------------------  ANGIOGRAPHY- PCI  LESION #1: Mid LAD lesion is 80% stenosed.  A drug-eluting stent was successfully placed using a STENT SYNERGY DES 2.25X16. -Postdilated to 2.4 mm  Post intervention, there is a 0% residual stenosis.  LESION #2: mid LAD to Dist LAD lesion is 90% stenosed.  A drug-eluting stent was successfully placed using a STENT SYNERGY DES 2.25X12. -Postdilated to 2.4 mm  Post intervention, there is a 0% residual stenosis.  Prox LAD lesion is 40% stenosed -prior to lesion 1; apical, after lesion 2  A drug-eluting stent was successfully placed using a STENT SYNERGY DES 2.25X12. -Postdilated to 2.4 mm  -----------------------------------------  Post intervention, there is a 0% residual stenosis.  Trifurcation 90% lesion at the distal circumflex into LPL and PDA -small caliber distal vessels. Best treated medically as this is not a very good PCI target  Prox small caliber nondominant RCA lesion is 99% stenosed.   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   Subtotally occluded nondominant RCA Moderately reduced LVEF of roughly 40% with mildly elevated LVEDP.   Plan: Transfer to postprocedure unit for post PCI monitoring and TR removal Continue aggressive risk factor modification  with statin and beta-blocker etc.   Recommend uninterrupted dual antiplatelet therapy with Aspirin 57m daily and Ticagrelor 963mtwice daily for a minimum of 12 months (ACS - Class I recommendation). -Would strongly consider reducing to 60 mg dose ticagrelor at 9-12 months and continue for an additional year.   Would be okay to stop aspirin after 3 months if necessary.  Okay to stop Brilinta after 6 months for procedures if necessary.     ASSESSMENT:    1. Coronary artery disease involving native coronary artery of native heart without angina pectoris   2. Chest pain, unspecified type   3. Ischemic cardiomyopathy   4. Dyspnea on exertion   5. Chronic combined systolic and diastolic CHF (congestive heart failure) [place]  6. Essential hypertension   7. Mixed hyperlipidemia   8. History of stroke   9. Tobacco abuse      PLAN:  In order of problems listed above:  CAD status post DES to the LAD and DES to the OM 11/2017 with residual disease that are small vessels and poor targets.  On Plavix and aspirin. Ongoing chest cramping into arms at rest and with exertion. Difficult to discern if cardiac or not.  EKG today shows some inferior lateral ST T wave changes new from 01/2018.  I reviewed patient and EKG with Dr. McDomenic Politend he agrees the patient needs to be placed on amlodipine and further assess with Myoview  will order lexican myoview to assess ischemic burden. Add amlodipine 5 mg daily.F/u with Dr. Harl Bowie.  Ischemic cardiomyopathy ejection fraction 35 to 40% with grade 1 DD Echo 01/2018-no CHF on exam, weight down 10 lbs.  Dyspnea on exertion-chronic but occurring with less activity. No chf on exam, has COPD and still smokes. Could also be secondary to deconditioning. Check lexiscan to rule out ischemia.  Chronic systolic and diastolic CHF-compensated   Essential HTN-BP up since he stopped losartan. Will try amlodipine 5 mg once daily.  Hyperlipidemia on Zetia and fenofibrate with  history of rhabdo myolysis on Crestor  History of CVA x2 with stenting as described above  Tobacco abuse-chronic dyspnea on exertion.  Medication Adjustments/Labs and Tests Ordered: Current medicines are reviewed at length with the patient today.  Concerns regarding medicines are outlined above.  Medication changes, Labs and Tests ordered today are listed in the Patient Instructions below. Patient Instructions  Medication Instructions: Your physician has recommended you make the following change in your medication:  Norvasc 5 mg Daily   If you need a refill on your cardiac medications before your next appointment, please call your pharmacy.   Lab work: NONE  If you have labs (blood work) drawn today and your tests are completely normal, you will receive your results only by: Marland Kitchen MyChart Message (if you have MyChart) OR . A paper copy in the mail If you have any lab test that is abnormal or we need to change your treatment, we will call you to review the results.  Testing/Procedures: Your physician has requested that you have a lexiscan myoview. For further information please visit [email or web address]. Please follow instruction sheet, as given.  Follow-Up: At [facility], you and your health needs are our priority.  As part of our continuing mission to provide you with exceptional heart care, we have created designated Provider Care Teams.  These Care Teams include your primary Cardiologist (physician) and Advanced Practice Providers (APPs -  Physician Assistants and Nurse Practitioners) who all work together to provide you with the care you need, when you need it. You will need a follow up appointment .  Please call our office 2 months in advance to schedule this appointment.  You may see Carlyle Dolly, MD or one of the following Advanced Practice Providers on your designated Care Team:   Bernerd Pho, PA-C [facility]) . Ermalinda Barrios, PA-C ([place])  Any Other  Special Instructions Will Be Listed Below (If Applicable). Thank you for choosing [facility]!        Signed, Ermalinda Barrios, PA-C  10/07/2018 1:13 PM    [facility] [address], [place], [place]  [address] Phone: [phone]; Fax: [phone]

## 2018-10-07 ENCOUNTER — Ambulatory Visit (INDEPENDENT_AMBULATORY_CARE_PROVIDER_SITE_OTHER): Payer: Self-pay | Admitting: Physician Assistant

## 2018-10-07 ENCOUNTER — Other Ambulatory Visit: Payer: Self-pay

## 2018-10-07 ENCOUNTER — Encounter: Payer: Self-pay | Admitting: Physician Assistant

## 2018-10-07 ENCOUNTER — Encounter: Payer: Self-pay | Admitting: *Deleted

## 2018-10-07 VITALS — BP 143/94 | HR 89 | Temp 97.9°F | Ht 78.0 in | Wt 267.0 lb

## 2018-10-07 DIAGNOSIS — R0609 Other forms of dyspnea: Secondary | ICD-10-CM

## 2018-10-07 DIAGNOSIS — R079 Chest pain, unspecified: Secondary | ICD-10-CM

## 2018-10-07 DIAGNOSIS — I10 Essential (primary) hypertension: Secondary | ICD-10-CM

## 2018-10-07 DIAGNOSIS — Z720 Tobacco use: Secondary | ICD-10-CM

## 2018-10-07 DIAGNOSIS — I255 Ischemic cardiomyopathy: Secondary | ICD-10-CM

## 2018-10-07 DIAGNOSIS — E782 Mixed hyperlipidemia: Secondary | ICD-10-CM

## 2018-10-07 DIAGNOSIS — I5042 Chronic combined systolic (congestive) and diastolic (congestive) heart failure: Secondary | ICD-10-CM

## 2018-10-07 DIAGNOSIS — Z8673 Personal history of transient ischemic attack (TIA), and cerebral infarction without residual deficits: Secondary | ICD-10-CM

## 2018-10-07 DIAGNOSIS — I2510 Atherosclerotic heart disease of native coronary artery without angina pectoris: Secondary | ICD-10-CM

## 2018-10-07 MED ORDER — AMLODIPINE BESYLATE 5 MG PO TABS: 5.0000 mg | ORAL_TABLET | Freq: Every day | ORAL | 3 refills | Status: DC

## 2018-10-07 NOTE — Patient Instructions (Signed)
Medication Instructions: Your physician has recommended you make the following change in your medication:  Norvasc 5 mg Daily   If you need a refill on your cardiac medications before your next appointment, please call your pharmacy.   Lab work: NONE  If you have labs (blood work) drawn today and your tests are completely normal, you will receive your results only by: Marland Kitchen MyChart Message (if you have MyChart) OR . A paper copy in the mail If you have any lab test that is abnormal or we need to change your treatment, we will call you to review the results.  Testing/Procedures: Your physician has requested that you have a lexiscan myoview. For further information please visit [email or web address]. Please follow instruction sheet, as given.  Follow-Up: At [facility], you and your health needs are our priority.  As part of our continuing mission to provide you with exceptional heart care, we have created designated Provider Care Teams.  These Care Teams include your primary Cardiologist (physician) and Advanced Practice Providers (APPs -  Physician Assistants and Nurse Practitioners) who all work together to provide you with the care you need, when you need it. You will need a follow up appointment .  Please call our office 2 months in advance to schedule this appointment.  You may see Carlyle Dolly, MD or one of the following Advanced Practice Providers on your designated Care Team:   Bernerd Pho, PA-C [facility]) . Ermalinda Barrios, PA-C ([place])  Any Other Special Instructions Will Be Listed Below (If Applicable). Thank you for choosing [facility]!

## 2018-10-09 ENCOUNTER — Other Ambulatory Visit: Payer: Medicaid Other

## 2018-10-09 ENCOUNTER — Ambulatory Visit (HOSPITAL_COMMUNITY): Payer: Self-pay

## 2018-10-17 ENCOUNTER — Encounter (HOSPITAL_COMMUNITY): Payer: Self-pay

## 2018-10-17 ENCOUNTER — Encounter (HOSPITAL_BASED_OUTPATIENT_CLINIC_OR_DEPARTMENT_OTHER)
Admission: RE | Admit: 2018-10-17 | Discharge: 2018-10-17 | Disposition: A | Discharge status: Home / Self Care | Payer: Self-pay | Source: Ambulatory Visit | Attending: Physician Assistant | Admitting: Physician Assistant

## 2018-10-17 ENCOUNTER — Other Ambulatory Visit: Payer: Self-pay

## 2018-10-17 ENCOUNTER — Encounter (HOSPITAL_COMMUNITY)
Admission: RE | Admit: 2018-10-17 | Discharge: 2018-10-17 | Disposition: A | Discharge status: Home / Self Care | Payer: Self-pay | Source: Ambulatory Visit | Attending: Physician Assistant | Admitting: Physician Assistant

## 2018-10-17 DIAGNOSIS — R079 Chest pain, unspecified: Secondary | ICD-10-CM | POA: Insufficient documentation

## 2018-10-17 HISTORY — DX: Heart failure, unspecified: I50.9

## 2018-10-17 LAB — NM MYOCAR MULTI W/SPECT W/WALL MOTION / EF
LV dias vol: 121 mL (ref 62–150)
LV sys vol: 65 mL
Peak HR: 92 {beats}/min
RATE: 0.33
Rest HR: 74 {beats}/min
SDS: 4
SRS: 1
SSS: 5
TID: 1.03

## 2018-10-17 MED ORDER — REGADENOSON 0.4 MG/5ML IV SOLN
INTRAVENOUS | Status: AC
  Administered 2018-10-17: 0.4 mg via INTRAVENOUS
  Filled 2018-10-17: qty 5

## 2018-10-17 MED ORDER — TECHNETIUM TC 99M TETROFOSMIN IV KIT
10.0000 | PACK | Freq: Once | INTRAVENOUS | Status: AC | PRN
  Administered 2018-10-17: 10.8 via INTRAVENOUS

## 2018-10-17 MED ORDER — SODIUM CHLORIDE 0.9% FLUSH
INTRAVENOUS | Status: AC
  Administered 2018-10-17: 10 mL via INTRAVENOUS
  Filled 2018-10-17: qty 10

## 2018-10-17 MED ORDER — TECHNETIUM TC 99M TETROFOSMIN IV KIT
30.0000 | PACK | Freq: Once | INTRAVENOUS | Status: AC | PRN
  Administered 2018-10-17: 32 via INTRAVENOUS

## 2018-10-17 NOTE — Progress Notes (Signed)
   Devin [name] presented for a nuclear stress test today.  No immediate complications.  Stress imaging is pending at this time.  Preliminary EKG findings may be listed in the chart, but the stress test result will not be finalized until perfusion imaging is complete.  1 day study, [facility] to read.  Rosaria Ferries, PA-C 10/17/2018, 8:01 PM

## 2018-10-18 ENCOUNTER — Telehealth: Payer: Self-pay | Admitting: Cardiology

## 2018-10-18 ENCOUNTER — Telehealth: Payer: Self-pay | Admitting: *Deleted

## 2018-10-18 NOTE — Telephone Encounter (Signed)
Pt's sister, Tye [place], [place] on file, has been made aware of pt's stress test results and all questions, if any, have been answered.

## 2018-10-18 NOTE — Telephone Encounter (Signed)
Pt had stress test done in [facility] yesterday. We were not sent the results. I advised pt's sister to return call back to [phone]. She stated that is the original number that called her. She will return call back to [place].

## 2018-10-18 NOTE — Telephone Encounter (Signed)
New Message     Devin [name] is returning a call for results  Please call back

## 2018-10-18 NOTE — Telephone Encounter (Signed)
Call placed to pt re: stress test results, left a message for pt to call back. (send call to [place] if I'm not available to take call)

## 2019-01-06 ENCOUNTER — Telehealth: Payer: Self-pay | Admitting: Cardiology

## 2019-01-06 ENCOUNTER — Encounter: Payer: Self-pay | Admitting: Cardiology

## 2019-01-06 ENCOUNTER — Ambulatory Visit (INDEPENDENT_AMBULATORY_CARE_PROVIDER_SITE_OTHER): Payer: Self-pay | Admitting: Cardiology

## 2019-01-06 VITALS — BP 132/85 | HR 77 | Temp 97.5°F | Ht 77.0 in | Wt 274.0 lb

## 2019-01-06 DIAGNOSIS — R0602 Shortness of breath: Secondary | ICD-10-CM

## 2019-01-06 DIAGNOSIS — I2510 Atherosclerotic heart disease of native coronary artery without angina pectoris: Secondary | ICD-10-CM

## 2019-01-06 NOTE — Telephone Encounter (Signed)

## 2019-01-06 NOTE — Progress Notes (Signed)
Clinical Summary Mr. Incorvaia is a 48 y.o.male seen today for follow up of the following medical rpoblems.    1. CAD/Chest pain/Chronic systolic HF - admitted 06/2874 with chest pain concerning for unstable angina.  11/2017 coronary CTA with severe prox LAD disease and possible mid LAD disease and D1.  11/2017 cath LM patent, LAD mid 80 to 90%, LCX distal 90%, OM2 90%, LPDA 90%, RCA small with prox 99% LVgram 35-45% (no echo on file).DES x 2to LAD, DES to OM, trifurcation lesion distal LCX, LPL and PDA recs for medical therapy, poor pci target. RCA small vessel treated medically.   Interventional cards recommended extended DAPT   01/2018 echo: LVEF 35-40%, grade I diastoluc dysfunction  - bradycardia on beta blocketers. History of rhabdo on crestor.Low bp and dizzienss on entresto.    10/2018 nuclear stress: no clear ischemia. LVEF 46% - some worsening SOB.  - occasional LE edema.  - constant wheezing. Some cough in the mornings. He is still smoking  - he was to stop brillinta and start plavix due to SOB. Appears from his pill bottles he is taking ASA, plavix and brillinta mistakingly.   2. Hyperlipidemia - history of rhabdo on crestor  02/2018 TC 211 TG 391 LDL 109 - on zetia. Fenofibrate, had TGs in 700s during recent admission. Note recent severe hypothyroidism and poorly contrlled DM2 likely playing a role.   3. Hypothyroidism - TSH previously above 100, followed by pcp    4. History of CVA - history of CVA 11/2017 - hadunderwent an image-guided diagnostic cerebral angiogram 12/01/2017 with Dr. 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 He then underwent an image-guided cerebral angiogram with revascularization of his vertebrobasilar junction/proximal basilar artery stenosis using stent assisted angioplasty 12/03/2017 by Dr. [name] - later had ISR requiring repeat procedure 02/2018     Past Medical History:  Diagnosis Date  . Asthma   . Chest pain   . CHF (congestive heart failure) ([place])   . Coronary artery disease    a. s/p DES x2 to LAD and DES to OM in 11/2017  . Depression   . Dyspnea   . GERD (gastroesophageal reflux disease)   . Headache   . History of kidney stones   . History of rhabdomyolysis   . Hyperlipidemia   . Hypertension   . Hypothyroidism   . Ischemic cardiomyopathy    a. 11/2017: echo showing EF of 35-40%, diffuse HK, and Grade 2 DD  . Kidney calculus 2014  . Pericardial effusion    Small, by dobutamine echocardiogram, 04/2006  . PONV (postoperative nausea and vomiting)   . Sleep apnea    does not wear cpap  . Stroke ([place])   . Tobacco abuse   . Type 2 diabetes mellitus without complications ([place]) 10/21/5724  . Vitamin D deficiency      Allergies  Allergen Reactions  . Crestor [Rosuvastatin] Other (See Comments)    rhabdomyolosis  . Bee Venom Swelling    SWELLING REACTION UNSPECIFIED      Current Outpatient Medications  Medication Sig Dispense Refill  . amLODipine (NORVASC) 5 MG tablet Take 1 tablet (5 mg total) by mouth daily. 90 tablet 3  . aspirin 81 MG EC tablet Take 1 tablet (81 mg total) by mouth daily. With food 30 tablet 0  . Blood Glucose Monitoring Suppl w/Device KIT Dispense based on patient and insurance preference. Check blood sugar daily before breakfast (ICD9 250.0) 1 each  0  . citalopram (CELEXA) 10 MG tablet Take 1 tablet (10 mg total) by mouth daily. 30 tablet 3  . EUTHYROX 200 MCG tablet Take 200 mcg by mouth daily.    Arna Medici 50 MCG tablet Take 50 mcg by mouth daily.    Marland Kitchen ezetimibe (ZETIA) 10 MG tablet Take 1 tablet (10 mg total) by mouth daily. 30 tablet 3  . fenofibrate 160 MG tablet Take 1 tablet (160 mg total) by mouth daily. 30 tablet 3  . gabapentin (NEURONTIN) 300 MG capsule Take 1 capsule (300 mg total) by mouth 2 (two) times daily. 60 capsule 4  . glimepiride (AMARYL) 2 MG tablet Take 1 tablet (2 mg  total) by mouth daily with breakfast. 30 tablet 0  . losartan (COZAAR) 25 MG tablet Take 0.5 tablets (12.5 mg total) by mouth daily. 30 tablet 2  . nitroGLYCERIN (NITROSTAT) 0.4 MG SL tablet Place 1 tablet (0.4 mg total) under the tongue every 5 (five) minutes as needed for chest pain. 25 tablet 3  . pantoprazole (PROTONIX) 40 MG tablet Take 40 mg by mouth every morning.    . promethazine (PHENERGAN) 25 MG tablet Take 1 tablet (25 mg total) by mouth every 6 (six) hours as needed for nausea or vomiting. 15 tablet 0  . ticagrelor (BRILINTA) 90 MG TABS tablet Take 1 tablet (90 mg total) by mouth 2 (two) times daily.     No current facility-administered medications for this visit.      Past Surgical History:  Procedure Laterality Date  . APPENDECTOMY    . 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 CHOLECYSTECTOMY N/A 01/27/2013   Procedure: LAPAROSCOPIC CHOLECYSTECTOMY;  Surgeon: Jamesetta So, MD;  Location: [facility];  Service: General;  Laterality: N/A;  . CORONARY STENT INTERVENTION N/A 11/15/2017   Procedure: CORONARY STENT INTERVENTION;  Surgeon: Leonie Man, MD;  Location: [place] [facility];  Service: Cardiovascular;  Laterality: N/A;  . HERNIA REPAIR     As a child  . INSERTION OF MESH N/A 11/13/2012   Procedure: INSERTION OF MESH;  Surgeon: Jamesetta So, MD;  Location: [facility];  Service: General;  Laterality: N/A;  . IR ANGIO INTRA EXTRACRAN SEL COM CAROTID INNOMINATE BILAT MOD SED  12/01/2017  . IR ANGIO INTRA EXTRACRAN SEL COM CAROTID INNOMINATE BILAT MOD SED  02/13/2018  . IR ANGIO VERTEBRAL SEL SUBCLAVIAN INNOMINATE UNI L MOD SED  02/13/2018  . IR ANGIO VERTEBRAL SEL VERTEBRAL BILAT MOD SED  12/01/2017  . IR ANGIO VERTEBRAL SEL VERTEBRAL UNI R MOD SED  02/13/2018  . IR INTRA CRAN STENT  12/03/2017  . IR PTA INTRACRANIAL  02/25/2018  . IR US GUIDE VASC ACCESS RIGHT  02/13/2018  . KIDNEY STONE SURGERY     . LEFT HEART CATH AND CORONARY ANGIOGRAPHY N/A 11/15/2017   Procedure: LEFT HEART CATH AND CORONARY ANGIOGRAPHY;  Surgeon: Leonie Man, MD;  Location: [place] [facility];  Service: Cardiovascular;  Laterality: N/A;  . PERCUTANEOUS NEPHROLITHOTRIPSY    . RADIOLOGY WITH ANESTHESIA Left 12/03/2017   Procedure: Angioplasty with possible stenting of left VBJ;  Surgeon: Luanne Bras, MD;  Location: [place];  Service: Radiology;  Laterality: Left;  . RADIOLOGY WITH ANESTHESIA N/A 02/25/2018   Procedure: STENT PLACEMENT;  Surgeon: Luanne Bras, MD;  Location: [place];  Service: Radiology;  Laterality: N/A;  . UMBILICAL HERNIA REPAIR N/A 11/13/2012  Procedure: UMBILICAL HERNIORRHAPHY;  Surgeon: Jamesetta So, MD;  Location: [facility];  Service: General;  Laterality: N/A;  . VARICOCELECTOMY       Allergies  Allergen Reactions  . Crestor [Rosuvastatin] Other (See Comments)    rhabdomyolosis  . Bee Venom Swelling    SWELLING REACTION UNSPECIFIED       Family History  Problem Relation Age of Onset  . Coronary artery disease Father   . Hypertension Father   . Hyperlipidemia Father   . Diabetes Father   . Congestive Heart Failure Father 71  . Arrhythmia Father        had an ICD  . Diabetes Mother   . Hypertension Mother   . Hyperlipidemia Mother   . Obesity Mother 52       died after bariatric surgery, liver failure and infection  . Coronary artery disease Maternal Grandfather        both grandfathers and several uncles  . Coronary artery disease Other   . Diabetes Sister        both sisters  . Stroke Maternal Aunt   . Stroke Maternal Uncle      Social History Mr. Eichorn reports that he has been smoking cigarettes. He started smoking about 33 years ago. He has a 6.50 pack-year smoking history. He quit smokeless tobacco use about 31 years ago.  His smokeless tobacco use included snuff and chew. Mr. Leavitt reports no history of alcohol use.   Review of Systems  CONSTITUTIONAL: No weight loss, fever, chills, weakness or fatigue.  HEENT: Eyes: No visual loss, blurred vision, double vision or yellow sclerae.No hearing loss, sneezing, congestion, runny nose or sore throat.  SKIN: No rash or itching.  CARDIOVASCULAR: per hpi RESPIRATORY: per hpi GASTROINTESTINAL: No anorexia, nausea, vomiting or diarrhea. No abdominal pain or blood.  GENITOURINARY: No burning on urination, no polyuria NEUROLOGICAL: No headache, dizziness, syncope, paralysis, ataxia, numbness or tingling in the extremities. No change in bowel or bladder control.  MUSCULOSKELETAL: No muscle, back pain, joint pain or stiffness.  LYMPHATICS: No enlarged nodes. No history of splenectomy.  PSYCHIATRIC: No history of depression or anxiety.  ENDOCRINOLOGIC: No reports of sweating, cold or heat intolerance. No polyuria or polydipsia.  Marland Kitchen   Physical Examination Today's Vitals   01/06/19 1541  BP: 132/85  Pulse: 77  Temp: (!) 97.5 F (36.4 C)  TempSrc: Temporal  SpO2: 97%  Weight: 274 lb (124.3 kg)  Height: 6' 5"  (1.956 m)   Body mass index is 32.49 kg/m.  Gen: resting comfortably, no acute distress HEENT: no scleral icterus, pupils equal round and reactive, no palptable cervical adenopathy,  CV: RRR, no m/r/g, no jvd Resp: Clear to auscultation bilaterally GI: abdomen is soft, non-tender, non-distended, normal bowel sounds, no hepatosplenomegaly MSK: extremities are warm, no edema.  Skin: warm, no rash Neuro:  no focal deficits Psych: appropriate affect   Diagnostic Studies 11/2017 cath  There is moderate left ventricular systolic dysfunction. The left ventricular ejection fraction is 35-45% by visual estimate.  LV end diastolic pressure is mildly elevated.  -----------------------------------------  ANGIOGRAPHY- PCI  LESION #1: Mid LAD lesion is 80% stenosed.  A drug-eluting stent was successfully placed using a STENT SYNERGY DES 2.25X16. -Postdilated to 2.4 mm   Post intervention, there is a 0% residual stenosis.  LESION #2: mid LAD to Dist LAD lesion is 90% stenosed.  A drug-eluting stent was successfully placed using a STENT SYNERGY DES 2.25X12. -Postdilated to 2.4 mm  Post intervention,  there is a 0% residual stenosis.  Prox LAD lesion is 40% stenosed -prior to lesion 1; apical, after lesion 2  A drug-eluting stent was successfully placed using a STENT SYNERGY DES 2.25X12. -Postdilated to 2.4 mm  -----------------------------------------  Post intervention, there is a 0% residual stenosis.  Trifurcation 90% lesion at the distal circumflex into LPL and PDA -small caliber distal vessels. Best treated medically as this is not a very good PCI target  Prox small caliber nondominant RCA lesion is 99% stenosed.  Severe distal OM 2 -DES PCI with Synergy 2.25 mm x16 mm (2.4 mm); severe trifurcation disease with a very distal AV groove circumflex prior to PDA and PL Claudetta Sallie -medical management.  Subtotally occluded nondominant RCA Moderately reduced LVEF of roughly 40% with mildly elevated LVEDP.  Plan: Transfer to postprocedure unit for post PCI monitoring and TR removal Continue aggressive risk factor modification with statin and beta-blocker etc.  Recommend uninterrupted dual antiplatelet therapy with Aspirin 71m daily and Ticagrelor 961mtwice dailyfor a minimum of 12 months (ACS - Class I recommendation).-Would strongly consider reducing to 60 mg dose ticagrelor at 9-12 months and continue for an additional year.  Would be okay to stop aspirin after 3 months if necessary. Okay to stop Brilinta after 6 months for procedures if necessary.   10/2018 nuclear stress  There was no ST segment deviation noted during stress.  Defect 1: There is a small defect of mild severity present in the mid inferoseptal location. This is likely due to soft tissue attenuation artifact.  This is a low risk study. No ischemic zones.  Nuclear stress EF: 46    Assessment and Plan  1. CAD/ICM - he is mistakingly taking ASA plavix and brillinta. Stop brillinta, he was to have stopped prevoiusly and just been on asa and plavix due to possible SOB from brillinta - extended DAPT based on last cath note - continue other current meds    2. SOB - I don't suspect cardiac. May improve off brillinta. Long tobacco history, we will obtain PFTs.    F/u 1 month    JoArnoldo LenisM.D.,

## 2019-01-06 NOTE — Patient Instructions (Signed)
Medication Instructions:  Stop BRILINTA   Labwork: NONE  Testing/Procedures: NONE  Follow-Up: Your physician recommends that you schedule a follow-up appointment in: 1 MONTH    Any Other Special Instructions Will Be Listed Below (If Applicable).     If you need a refill on your cardiac medications before your next appointment, please call your pharmacy.

## 2019-01-11 ENCOUNTER — Emergency Department (HOSPITAL_COMMUNITY)
Admission: EM | Admit: 2019-01-11 | Discharge: 2019-01-11 | Disposition: A | Discharge status: Home / Self Care | Payer: Self-pay | Attending: Emergency Medicine | Admitting: Emergency Medicine

## 2019-01-11 ENCOUNTER — Other Ambulatory Visit: Payer: Self-pay

## 2019-01-11 ENCOUNTER — Emergency Department (HOSPITAL_COMMUNITY): Payer: Self-pay

## 2019-01-11 DIAGNOSIS — F17210 Nicotine dependence, cigarettes, uncomplicated: Secondary | ICD-10-CM | POA: Insufficient documentation

## 2019-01-11 DIAGNOSIS — Z7982 Long term (current) use of aspirin: Secondary | ICD-10-CM | POA: Insufficient documentation

## 2019-01-11 DIAGNOSIS — E1122 Type 2 diabetes mellitus with diabetic chronic kidney disease: Secondary | ICD-10-CM | POA: Insufficient documentation

## 2019-01-11 DIAGNOSIS — E039 Hypothyroidism, unspecified: Secondary | ICD-10-CM | POA: Insufficient documentation

## 2019-01-11 DIAGNOSIS — I509 Heart failure, unspecified: Secondary | ICD-10-CM | POA: Insufficient documentation

## 2019-01-11 DIAGNOSIS — Z79899 Other long term (current) drug therapy: Secondary | ICD-10-CM | POA: Insufficient documentation

## 2019-01-11 DIAGNOSIS — N200 Calculus of kidney: Secondary | ICD-10-CM | POA: Insufficient documentation

## 2019-01-11 DIAGNOSIS — Z7901 Long term (current) use of anticoagulants: Secondary | ICD-10-CM | POA: Insufficient documentation

## 2019-01-11 DIAGNOSIS — Z8673 Personal history of transient ischemic attack (TIA), and cerebral infarction without residual deficits: Secondary | ICD-10-CM | POA: Insufficient documentation

## 2019-01-11 DIAGNOSIS — I130 Hypertensive heart and chronic kidney disease with heart failure and stage 1 through stage 4 chronic kidney disease, or unspecified chronic kidney disease: Secondary | ICD-10-CM | POA: Insufficient documentation

## 2019-01-11 DIAGNOSIS — N189 Chronic kidney disease, unspecified: Secondary | ICD-10-CM | POA: Insufficient documentation

## 2019-01-11 LAB — CBC WITH DIFFERENTIAL/PLATELET
Abs Immature Granulocytes: 0.05 10*3/uL (ref 0.00–0.07)
Basophils Absolute: 0.1 10*3/uL (ref 0.0–0.1)
Basophils Relative: 0 %
Eosinophils Absolute: 0.2 10*3/uL (ref 0.0–0.5)
Eosinophils Relative: 2 %
HCT: 48.6 % (ref 39.0–52.0)
Hemoglobin: 15.6 g/dL (ref 13.0–17.0)
Immature Granulocytes: 0 %
Lymphocytes Relative: 16 %
Lymphs Abs: 2.3 10*3/uL (ref 0.7–4.0)
MCH: 28.7 pg (ref 26.0–34.0)
MCHC: 32.1 g/dL (ref 30.0–36.0)
MCV: 89.3 fL (ref 80.0–100.0)
Monocytes Absolute: 0.6 10*3/uL (ref 0.1–1.0)
Monocytes Relative: 5 %
Neutro Abs: 10.8 10*3/uL — ABNORMAL HIGH (ref 1.7–7.7)
Neutrophils Relative %: 77 %
Platelets: 298 10*3/uL (ref 150–400)
RBC: 5.44 MIL/uL (ref 4.22–5.81)
RDW: 14.6 % (ref 11.5–15.5)
WBC: 14 10*3/uL — ABNORMAL HIGH (ref 4.0–10.5)
nRBC: 0 % (ref 0.0–0.2)

## 2019-01-11 LAB — BASIC METABOLIC PANEL
Anion gap: 10 (ref 5–15)
BUN: 17 mg/dL (ref 6–20)
CO2: 23 mmol/L (ref 22–32)
Calcium: 9.5 mg/dL (ref 8.9–10.3)
Chloride: 102 mmol/L (ref 98–111)
Creatinine, Ser: 1.03 mg/dL (ref 0.61–1.24)
GFR calc Af Amer: >60 mL/min (ref >60)
GFR calc non Af Amer: >60 mL/min (ref >60)
Glucose, Bld: 248 mg/dL — ABNORMAL HIGH (ref 70–99)
Potassium: 3.6 mmol/L (ref 3.5–5.1)
Sodium: 135 mmol/L (ref 135–145)

## 2019-01-11 LAB — URINALYSIS, ROUTINE W REFLEX MICROSCOPIC
Bacteria, UA: NONE SEEN
Bilirubin Urine: NEGATIVE
Glucose, UA: >=500 mg/dL — AB
Ketones, ur: NEGATIVE mg/dL
Leukocytes,Ua: NEGATIVE
Nitrite: NEGATIVE
Protein, ur: 30 mg/dL — AB
RBC / HPF: >50 RBC/hpf — ABNORMAL HIGH (ref 0–5)
Specific Gravity, Urine: 1.011 (ref 1.005–1.030)
pH: 6 (ref 5.0–8.0)

## 2019-01-11 MED ORDER — ONDANSETRON HCL 4 MG PO TABS: 4.0000 mg | ORAL_TABLET | Freq: Four times a day (QID) | ORAL | 0 refills | Status: DC | PRN

## 2019-01-11 MED ORDER — OXYCODONE-ACETAMINOPHEN 5-325 MG PO TABS: 1.0000 | ORAL_TABLET | ORAL | 0 refills | Status: DC | PRN

## 2019-01-11 MED ORDER — TAMSULOSIN HCL 0.4 MG PO CAPS: 0.4000 mg | ORAL_CAPSULE | Freq: Every day | ORAL | 0 refills | Status: DC

## 2019-01-11 MED ORDER — HYDROMORPHONE HCL 1 MG/ML IJ SOLN
1.0000 mg | Freq: Once | INTRAMUSCULAR | Status: AC
  Administered 2019-01-11: 1 mg via INTRAVENOUS
  Filled 2019-01-11: qty 1

## 2019-01-11 MED ORDER — ONDANSETRON HCL 4 MG/2ML IJ SOLN
4.0000 mg | Freq: Once | INTRAMUSCULAR | Status: AC
  Administered 2019-01-11: 4 mg via INTRAVENOUS
  Filled 2019-01-11: qty 2

## 2019-01-11 NOTE — ED Triage Notes (Signed)
Patient presents to the ED with complaints of right sided flank pain that began yesterday and has progressively gotten worse. Patient reports a hx of kidney stones but states this different than before. Patient reports pain over right lower flank and radiating towards his back over his kidney. Denies blood in urine, or odor. No issues with urination. Patient denies fevers.

## 2019-01-11 NOTE — ED Provider Notes (Signed)
[facility] EMERGENCY DEPARTMENT Provider Note   CSN: [id number] Arrival date & time: 01/11/19  0101     History   Chief Complaint Chief Complaint  Patient presents with  . Flank Pain    HPI Devin [name] is a 48 y.o. male.     Patient presents to the emergency department for evaluation of right-sided abdominal and back pain.  Symptoms began yesterday and have progressively worsened.  He reports that he does have a history of kidney stones, but previously kidney stones have given him pain mostly in the area of the testicle.  He reports that the pain started "like a muscle pull" but has worsened over time.  He has caused nausea but no vomiting.  No diarrhea or constipation.  He has not had a fever.  He has not noticed any changes in his urine.     Past Medical History:  Diagnosis Date  . Asthma   . Chest pain   . CHF (congestive heart failure) ([place])   . Coronary artery disease    a. s/p DES x2 to LAD and DES to OM in 11/2017  . Depression   . Dyspnea   . GERD (gastroesophageal reflux disease)   . Headache   . History of kidney stones   . History of rhabdomyolysis   . Hyperlipidemia   . Hypertension   . Hypothyroidism   . Ischemic cardiomyopathy    a. 11/2017: echo showing EF of 35-40%, diffuse HK, and Grade 2 DD  . Kidney calculus 2014  . Pericardial effusion    Small, by dobutamine echocardiogram, 04/2006  . PONV (postoperative nausea and vomiting)   . Sleep apnea    does not wear cpap  . Stroke ([place])   . Tobacco abuse   . Type 2 diabetes mellitus without complications ([place]) 0/22/3361  . Vitamin D deficiency     Patient Active Problem List   Diagnosis Date Noted  . Ischemic cardiomyopathy 10/02/2018  . 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 Acute cerebrovascular accident (CVA) ([place]) 02/10/2018  . Chronic combined systolic and diastolic CHF (congestive heart failure) ([place])  01/06/2018  . Altered mental status 01/05/2018  . History of stroke 01/05/2018  . 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 Cerebellar stroke ([place])   . TIA (transient ischemic attack) 11/30/2017  . Abnormal cardiac CT angiography   . Coronary artery disease   . Chest pain due to CAD ([place]) 11/14/2017  . Chest pain with moderate risk for cardiac etiology   . CKD (chronic kidney disease) 11/13/2017  . Hypothyroidism 10/07/2013  . Type 2 diabetes mellitus without complications ([place]) 22/44/9753  . Acute renal failure ([place]) 10/06/2013  . Elevated AST (SGOT) 10/06/2013  . Elevated ALT measurement 10/06/2013  . Chest pain   . Tobacco abuse   . Hypertension   . Hyperlipidemia   . Pericardial effusion   . History of rhabdomyolysis     Past Surgical History:  Procedure Laterality Date  . APPENDECTOMY    . 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 CHOLECYSTECTOMY N/A 01/27/2013   Procedure: LAPAROSCOPIC CHOLECYSTECTOMY;  Surgeon: Jamesetta So, MD;  Location: [facility];  Service: General;  Laterality: N/A;  . CORONARY STENT INTERVENTION N/A 11/15/2017   Procedure: CORONARY STENT INTERVENTION;  Surgeon: Leonie Man, MD;  Location: [place] [facility];  Service: Cardiovascular;  Laterality:  N/A;  . HERNIA REPAIR     As a child  . INSERTION OF MESH N/A 11/13/2012   Procedure: INSERTION OF MESH;  Surgeon: Jamesetta So, MD;  Location: [facility];  Service: General;  Laterality: N/A;  . IR ANGIO INTRA EXTRACRAN SEL COM CAROTID INNOMINATE BILAT MOD SED  12/01/2017  . IR ANGIO INTRA EXTRACRAN SEL COM CAROTID INNOMINATE BILAT MOD SED  02/13/2018  . IR ANGIO VERTEBRAL SEL SUBCLAVIAN INNOMINATE UNI L MOD SED  02/13/2018  . IR ANGIO VERTEBRAL SEL VERTEBRAL BILAT MOD SED  12/01/2017  . IR ANGIO VERTEBRAL SEL VERTEBRAL UNI R MOD SED  02/13/2018  . IR INTRA CRAN STENT  12/03/2017  . IR PTA INTRACRANIAL  02/25/2018  . IR US GUIDE  VASC ACCESS RIGHT  02/13/2018  . KIDNEY STONE SURGERY    . LEFT HEART CATH AND CORONARY ANGIOGRAPHY N/A 11/15/2017   Procedure: LEFT HEART CATH AND CORONARY ANGIOGRAPHY;  Surgeon: Leonie Man, MD;  Location: [place] [facility];  Service: Cardiovascular;  Laterality: N/A;  . PERCUTANEOUS NEPHROLITHOTRIPSY    . RADIOLOGY WITH ANESTHESIA Left 12/03/2017   Procedure: Angioplasty with possible stenting of left VBJ;  Surgeon: Luanne Bras, MD;  Location: [place];  Service: Radiology;  Laterality: Left;  . RADIOLOGY WITH ANESTHESIA N/A 02/25/2018   Procedure: STENT PLACEMENT;  Surgeon: Luanne Bras, MD;  Location: [place];  Service: Radiology;  Laterality: N/A;  . UMBILICAL HERNIA REPAIR N/A 11/13/2012   Procedure: UMBILICAL HERNIORRHAPHY;  Surgeon: Jamesetta So, MD;  Location: [facility];  Service: General;  Laterality: N/A;  . VARICOCELECTOMY          Home Medications    Prior to Admission medications   Medication Sig Start Date End Date Taking? Authorizing Provider  amLODipine (NORVASC) 5 MG tablet Take 1 tablet (5 mg total) by mouth daily. 10/07/18 01/06/19  Imogene Burn, PA-C  aspirin 81 MG EC tablet Take 1 tablet (81 mg total) by mouth daily. With food 12/05/17   Roxan Hockey, MD  Blood Glucose Monitoring Suppl w/Device KIT Dispense based on patient and insurance preference. Check blood sugar daily before breakfast (ICD9 250.0) 04/29/17   Pollina, Gwenyth Allegra, MD  clopidogrel (PLAVIX) 75 MG tablet Take 75 mg by mouth daily.    [provider]  EUTHYROX 200 MCG tablet Take 200 mcg by mouth daily. 07/20/18   [provider]  EUTHYROX 50 MCG tablet Take 50 mcg by mouth daily. 07/20/18   [provider]  ezetimibe (ZETIA) 10 MG tablet Take 1 tablet (10 mg total) by mouth daily. 12/06/17   Roxan Hockey, MD  famotidine (PEPCID) 20 MG tablet Take 20 mg by mouth 2 (two) times daily.    [provider]  fenofibrate (TRICOR) 145 MG tablet Take 145 mg  by mouth daily.    [provider]  glimepiride (AMARYL) 2 MG tablet Take 1 tablet (2 mg total) by mouth daily with breakfast. 11/16/17   Patrecia Pour, MD  nitroGLYCERIN (NITROSTAT) 0.4 MG SL tablet Place 1 tablet (0.4 mg total) under the tongue every 5 (five) minutes as needed for chest pain. 01/15/18   Strader, Fransisco Hertz, PA-C  ondansetron (ZOFRAN) 4 MG tablet Take 1 tablet (4 mg total) by mouth every 6 (six) hours as needed for nausea or vomiting. 01/11/19   Orpah Greek, MD  oxyCODONE-acetaminophen (PERCOCET) 5-325 MG tablet Take 1-2 tablets by mouth every 4 (four) hours as needed. 01/11/19   Pollina,  Gwenyth Allegra, MD  oxyCODONE-acetaminophen (PERCOCET) 5-325 MG tablet Take 1-2 tablets by mouth every 4 (four) hours as needed. 01/11/19   Orpah Greek, MD  sitaGLIPtin (JANUVIA) 100 MG tablet Take 100 mg by mouth daily.    [provider]  tamsulosin (FLOMAX) 0.4 MG CAPS capsule Take 1 capsule (0.4 mg total) by mouth daily. Take 1 daily until you pass the kidney stone 01/11/19   Pollina, Gwenyth Allegra, MD    Family History Family History  Problem Relation Age of Onset  . Coronary artery disease Father   . Hypertension Father   . Hyperlipidemia Father   . Diabetes Father   . Congestive Heart Failure Father 69  . Arrhythmia Father        had an ICD  . Diabetes Mother   . Hypertension Mother   . Hyperlipidemia Mother   . Obesity Mother 18       died after bariatric surgery, liver failure and infection  . Coronary artery disease Maternal Grandfather        both grandfathers and several uncles  . Coronary artery disease Other   . Diabetes Sister        both sisters  . Stroke Maternal Aunt   . Stroke Maternal Uncle     Social History Social History   Tobacco Use  . Smoking status: Current Some Day Smoker    Packs/day: 0.25    Years: 26.00    Pack years: 6.50    Types: Cigarettes    Start date: 03/13/1985  . Smokeless tobacco: Former User     Types: Snuff, Sarina Ser    Quit date: 03/14/1987  Substance Use Topics  . Alcohol use: No  . Drug use: No     Allergies   Crestor [rosuvastatin], Trulicity [dulaglutide], and Bee venom   Review of Systems Review of Systems  Gastrointestinal: Positive for abdominal pain and nausea.  Genitourinary: Positive for flank pain.  All other systems reviewed and are negative.    Physical Exam Updated Vital Signs BP (!) 153/103 (BP Location: Right Arm)   Pulse 76   Temp 99 F (37.2 C) (Oral)   Resp 17   Ht 6' 6" (1.981 m)   Wt 122.5 kg   SpO2 97%   BMI 31.20 kg/m   Physical Exam Vitals signs and nursing note reviewed.  Constitutional:      General: He is not in acute distress.    Appearance: Normal appearance. He is well-developed.  HENT:     Head: Normocephalic and atraumatic.     Right Ear: Hearing normal.     Left Ear: Hearing normal.     Nose: Nose normal.  Eyes:     Conjunctiva/sclera: Conjunctivae normal.     Pupils: Pupils are equal, round, and reactive to light.  Neck:     Musculoskeletal: Normal range of motion and neck supple.  Cardiovascular:     Rate and Rhythm: Regular rhythm.     Heart sounds: S1 normal and S2 normal. No murmur. No friction rub. No gallop.   Pulmonary:     Effort: Pulmonary effort is normal. No respiratory distress.     Breath sounds: Normal breath sounds.  Chest:     Chest wall: No tenderness.  Abdominal:     General: Bowel sounds are normal.     Palpations: Abdomen is soft.     Tenderness: There is abdominal tenderness (Slight right lower quadrant tenderness without guarding or rebound). There is no guarding or  rebound. Negative signs include Murphy's sign and McBurney's sign.     Hernia: No hernia is present.  Musculoskeletal: Normal range of motion.  Skin:    General: Skin is warm and dry.     Findings: No rash.  Neurological:     Mental Status: He is alert and oriented to person, place, and time.     GCS: GCS eye subscore is 4. GCS  verbal subscore is 5. GCS motor subscore is 6.     Cranial Nerves: No cranial nerve deficit.     Sensory: No sensory deficit.     Coordination: Coordination normal.  Psychiatric:        Speech: Speech normal.        Behavior: Behavior normal.        Thought Content: Thought content normal.      ED Treatments / Results  Labs (all labs ordered are listed, but only abnormal results are displayed) Labs Reviewed  CBC WITH DIFFERENTIAL/PLATELET - Abnormal; Notable for the following components:      Result Value   WBC 14.0 (*)    Neutro Abs 10.8 (*)    All other components within normal limits  BASIC METABOLIC PANEL - Abnormal; Notable for the following components:   Glucose, Bld 248 (*)    All other components within normal limits  URINALYSIS, ROUTINE W REFLEX MICROSCOPIC - Abnormal; Notable for the following components:   Glucose, UA >=500 (*)    Hgb urine dipstick LARGE (*)    Protein, ur 30 (*)    RBC / HPF >50 (*)    All other components within normal limits  URINE CULTURE    EKG None  Radiology Ct Renal Stone Study  Result Date: 01/11/2019 CLINICAL DATA:  48 year old male with right flank pain. Concern for kidney stone. EXAM: CT ABDOMEN AND PELVIS WITHOUT CONTRAST TECHNIQUE: Multidetector CT imaging of the abdomen and pelvis was performed following the standard protocol without IV contrast. COMPARISON:  CT of the abdomen pelvis dated 11/30/2017 FINDINGS: Evaluation of this exam is limited in the absence of intravenous contrast. Lower chest: Bibasilar linear atelectasis/scarring. There is multi vessel coronary vascular calcification. No intra-abdominal free air or free fluid. Hepatobiliary: Diffuse fatty infiltration of the liver. No intrahepatic biliary ductal dilatation. Cholecystectomy. No retained calcified stone noted in the central CBD. Pancreas: Unremarkable. No pancreatic ductal dilatation or surrounding inflammatory changes. Spleen: Normal in size without focal  abnormality. Adrenals/Urinary Tract: The adrenal glands are unremarkable. There is a 6 mm right ureteropelvic junction stone with mild right hydronephrosis. Several smaller additional nonobstructing right renal calculi measure up to 4 mm in the interpolar aspect of the right kidney. Punctate nonobstructing left renal calculi noted. There is no hydronephrosis on the left. The left ureter and urinary bladder appear unremarkable. Stomach/Bowel: There is no bowel obstruction or active inflammation. Normal appendix. Vascular/Lymphatic: Mild aortoiliac atherosclerotic disease. The IVC is unremarkable. No portal venous gas. There is no adenopathy. Reproductive: The prostate and seminal vesicles are grossly unremarkable. Other: Small fat containing umbilical hernia. Musculoskeletal: Degenerative changes of the spine. No acute osseous pathology. IMPRESSION: 1. A 6 mm right UPJ stone with mild right hydronephrosis. Additional small nonobstructing bilateral renal calculi noted. No hydronephrosis on the left. 2. Fatty liver. 3. No bowel obstruction or active inflammation. Normal appendix. 4. aortic Atherosclerosis (ICD10-I70.0). Electronically Signed   By: Anner Crete M.D.   On: 01/11/2019 02:54    Procedures Procedures (including critical care time)  Medications Ordered in ED Medications  HYDROmorphone (DILAUDID) injection 1 mg (1 mg Intravenous Given 01/11/19 0223)  ondansetron (ZOFRAN) injection 4 mg (4 mg Intravenous Given 01/11/19 0223)     Initial Impression / Assessment and Plan / ED Course  I have reviewed the triage vital signs and the nursing notes.  Pertinent labs & imaging results that were available during my care of the patient were reviewed by me and considered in my medical decision making (see chart for details).       Patient presents to the ER for evaluation of right flank pain.  He does have a history of kidney stones but feels that this pain is different than his previous stones.   Patient had significant improvement with a single dose of Dilaudid.  Vital signs are stable, CT scan does confirm right UPJ stone, 6 mm.  He does have some white cells in his urine but no esterase, nitrites or bacteria.  This does not appear consistent with infection, will culture.  Discharged with Flomax, analgesia, return for fever or uncontrolled pain.  Final Clinical Impressions(s) / ED Diagnoses   Final diagnoses:  Kidney stone    ED Discharge Orders         Ordered    oxyCODONE-acetaminophen (PERCOCET) 5-325 MG tablet  Every 4 hours PRN,   Status:  Discontinued     01/11/19 0451    tamsulosin (FLOMAX) 0.4 MG CAPS capsule  Daily     01/11/19 0452    ondansetron (ZOFRAN) 4 MG tablet  Every 6 hours PRN     01/11/19 0452    oxyCODONE-acetaminophen (PERCOCET) 5-325 MG tablet  Every 4 hours PRN     01/11/19 0452    oxyCODONE-acetaminophen (PERCOCET) 5-325 MG tablet  Every 4 hours PRN     01/11/19 0453           Orpah Greek, MD 01/11/19 [phone]

## 2019-01-13 LAB — URINE CULTURE: Culture: <10000 — AB

## 2019-01-13 MED FILL — Oxycodone w/ Acetaminophen Tab 5-325 MG: ORAL | Qty: 6 | Status: AC

## 2019-01-23 ENCOUNTER — Other Ambulatory Visit: Payer: Self-pay

## 2019-01-23 ENCOUNTER — Emergency Department (HOSPITAL_COMMUNITY): Payer: Self-pay

## 2019-01-23 ENCOUNTER — Emergency Department (HOSPITAL_COMMUNITY)
Admission: EM | Admit: 2019-01-23 | Discharge: 2019-01-23 | Disposition: A | Discharge status: Home / Self Care | Payer: Self-pay | Attending: Emergency Medicine | Admitting: Emergency Medicine

## 2019-01-23 ENCOUNTER — Encounter (HOSPITAL_COMMUNITY): Payer: Self-pay | Admitting: Emergency Medicine

## 2019-01-23 DIAGNOSIS — J0191 Acute recurrent sinusitis, unspecified: Secondary | ICD-10-CM | POA: Insufficient documentation

## 2019-01-23 DIAGNOSIS — Z20828 Contact with and (suspected) exposure to other viral communicable diseases: Secondary | ICD-10-CM | POA: Insufficient documentation

## 2019-01-23 DIAGNOSIS — E1122 Type 2 diabetes mellitus with diabetic chronic kidney disease: Secondary | ICD-10-CM | POA: Insufficient documentation

## 2019-01-23 DIAGNOSIS — I509 Heart failure, unspecified: Secondary | ICD-10-CM | POA: Insufficient documentation

## 2019-01-23 DIAGNOSIS — F17210 Nicotine dependence, cigarettes, uncomplicated: Secondary | ICD-10-CM | POA: Insufficient documentation

## 2019-01-23 DIAGNOSIS — N189 Chronic kidney disease, unspecified: Secondary | ICD-10-CM | POA: Insufficient documentation

## 2019-01-23 DIAGNOSIS — Z7984 Long term (current) use of oral hypoglycemic drugs: Secondary | ICD-10-CM | POA: Insufficient documentation

## 2019-01-23 DIAGNOSIS — J45909 Unspecified asthma, uncomplicated: Secondary | ICD-10-CM | POA: Insufficient documentation

## 2019-01-23 DIAGNOSIS — Z7902 Long term (current) use of antithrombotics/antiplatelets: Secondary | ICD-10-CM | POA: Insufficient documentation

## 2019-01-23 DIAGNOSIS — I130 Hypertensive heart and chronic kidney disease with heart failure and stage 1 through stage 4 chronic kidney disease, or unspecified chronic kidney disease: Secondary | ICD-10-CM | POA: Insufficient documentation

## 2019-01-23 DIAGNOSIS — E039 Hypothyroidism, unspecified: Secondary | ICD-10-CM | POA: Insufficient documentation

## 2019-01-23 DIAGNOSIS — I2510 Atherosclerotic heart disease of native coronary artery without angina pectoris: Secondary | ICD-10-CM | POA: Insufficient documentation

## 2019-01-23 DIAGNOSIS — Z79899 Other long term (current) drug therapy: Secondary | ICD-10-CM | POA: Insufficient documentation

## 2019-01-23 MED ORDER — BENZONATATE 100 MG PO CAPS: 200.0000 mg | ORAL_CAPSULE | Freq: Three times a day (TID) | ORAL | 0 refills | Status: DC | PRN

## 2019-01-23 MED ORDER — AMOXICILLIN-POT CLAVULANATE 875-125 MG PO TABS: 1.0000 | ORAL_TABLET | Freq: Two times a day (BID) | ORAL | 0 refills | Status: DC

## 2019-01-23 MED ORDER — AMOXICILLIN-POT CLAVULANATE 875-125 MG PO TABS
1.0000 | ORAL_TABLET | Freq: Once | ORAL | Status: AC
  Administered 2019-01-23: 1 via ORAL
  Filled 2019-01-23: qty 1

## 2019-01-23 MED ORDER — BENZONATATE 100 MG PO CAPS
200.0000 mg | ORAL_CAPSULE | Freq: Once | ORAL | Status: AC
  Administered 2019-01-23: 200 mg via ORAL
  Filled 2019-01-23: qty 2

## 2019-01-23 NOTE — Discharge Instructions (Addendum)
Your chest x-ray is stable with no sign of lung infection.  You are being placed on an antibiotic for treatment of your sinus infection.  Complete the entire course of this medication.  Of also prescribed you Tessalon to help you with your cough symptoms.  With your other medical conditions, specifically your high blood pressure I recommend Coricidin brand sinus headache tablets for nasal symptoms relief.  This will not adversely affect your blood pressure.  Rest and make sure you are drinking plenty of fluids.  Your Covid test is pending at this time and should be resulted within 24 hours.  I suspect this test will be negative, however until you have a proven negative test you should stay home and avoid contact with others.  If your test is positive you will receive a phone call from [name] with further instructions.

## 2019-01-23 NOTE — ED Triage Notes (Signed)
Pt c/o of headache, nasal congestion, cough, nausea x 2 days

## 2019-01-24 LAB — SARS CORONAVIRUS 2 (TAT 6-24 HRS): SARS Coronavirus 2: NEGATIVE

## 2019-01-25 NOTE — ED Provider Notes (Signed)
[facility] EMERGENCY DEPARTMENT Provider Note   CSN: [id number] Arrival date & time: 01/23/19  1758     History   Chief Complaint Chief Complaint  Patient presents with  . Headache    HPI Devin [name] is a 48 y.o. male with a history significant for asthma, CHF, CAD, HTN and DM presenting with a 2 day history of frontal sinus headache in association with thick, green nasal discharge (nonbloody) along with post nasal drip which he suspects is the source of some mild nausea.  Additionally he reports a dry cough and developing mild throat soreness.  Sx are c/w prior sinus infections.  He reports fatigue, denies documented fever or chills.  He has no neck pain, stiffness, no sob or wheezing, no palpitations, cp, vomiting or abdominal pain.  He has found no alleviators for his sx. Denies known exposure to Covid, but does express this concern.    The history is provided by the patient.    Past Medical History:  Diagnosis Date  . Asthma   . Chest pain   . CHF (congestive heart failure) ([place])   . Coronary artery disease    a. s/p DES x2 to LAD and DES to OM in 11/2017  . Depression   . Dyspnea   . GERD (gastroesophageal reflux disease)   . Headache   . History of kidney stones   . History of rhabdomyolysis   . Hyperlipidemia   . Hypertension   . Hypothyroidism   . Ischemic cardiomyopathy    a. 11/2017: echo showing EF of 35-40%, diffuse HK, and Grade 2 DD  . Kidney calculus 2014  . Pericardial effusion    Small, by dobutamine echocardiogram, 04/2006  . PONV (postoperative nausea and vomiting)   . Sleep apnea    does not wear cpap  . Stroke ([place])   . Tobacco abuse   . Type 2 diabetes mellitus without complications ([place]) 3/66/2947  . Vitamin D deficiency     Patient Active Problem List   Diagnosis Date Noted  . Ischemic cardiomyopathy 10/02/2018  . 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  Acute cerebrovascular accident (CVA) ([place]) 02/10/2018  . Chronic combined systolic and diastolic CHF (congestive heart failure) ([place]) 01/06/2018  . Altered mental status 01/05/2018  . History of stroke 01/05/2018  . 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 Cerebellar stroke ([place])   . TIA (transient ischemic attack) 11/30/2017  . Abnormal cardiac CT angiography   . Coronary artery disease   . Chest pain due to CAD ([place]) 11/14/2017  . Chest pain with moderate risk for cardiac etiology   . CKD (chronic kidney disease) 11/13/2017  . Hypothyroidism 10/07/2013  . Type 2 diabetes mellitus without complications ([place]) 65/46/5035  . Acute renal failure ([place]) 10/06/2013  . Elevated AST (SGOT) 10/06/2013  . Elevated ALT measurement 10/06/2013  . Chest pain   . Tobacco abuse   . Hypertension   . Hyperlipidemia   . Pericardial effusion   . History of rhabdomyolysis     Past Surgical History:  Procedure Laterality Date  . APPENDECTOMY    . 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 CHOLECYSTECTOMY N/A 01/27/2013   Procedure: LAPAROSCOPIC CHOLECYSTECTOMY;  Surgeon: Jamesetta So, MD;  Location: [facility] ORS;  Service: General;  Laterality: N/A;  . CORONARY STENT INTERVENTION N/A 11/15/2017   Procedure: CORONARY STENT  INTERVENTION;  Surgeon: Leonie Man, MD;  Location: [place] CV LAB;  Service: Cardiovascular;  Laterality: N/A;  . HERNIA REPAIR     As a child  . INSERTION OF MESH N/A 11/13/2012   Procedure: INSERTION OF MESH;  Surgeon: Jamesetta So, MD;  Location: [facility];  Service: General;  Laterality: N/A;  . IR ANGIO INTRA EXTRACRAN SEL COM CAROTID INNOMINATE BILAT MOD SED  12/01/2017  . IR ANGIO INTRA EXTRACRAN SEL COM CAROTID INNOMINATE BILAT MOD SED  02/13/2018  . IR ANGIO VERTEBRAL SEL SUBCLAVIAN INNOMINATE UNI L MOD SED  02/13/2018  . IR ANGIO VERTEBRAL SEL VERTEBRAL BILAT MOD SED  12/01/2017  . IR ANGIO  VERTEBRAL SEL VERTEBRAL UNI R MOD SED  02/13/2018  . IR INTRA CRAN STENT  12/03/2017  . IR PTA INTRACRANIAL  02/25/2018  . IR US GUIDE VASC ACCESS RIGHT  02/13/2018  . KIDNEY STONE SURGERY    . LEFT HEART CATH AND CORONARY ANGIOGRAPHY N/A 11/15/2017   Procedure: LEFT HEART CATH AND CORONARY ANGIOGRAPHY;  Surgeon: Leonie Man, MD;  Location: [place] CV LAB;  Service: Cardiovascular;  Laterality: N/A;  . PERCUTANEOUS NEPHROLITHOTRIPSY    . RADIOLOGY WITH ANESTHESIA Left 12/03/2017   Procedure: Angioplasty with possible stenting of left VBJ;  Surgeon: Luanne Bras, MD;  Location: [place];  Service: Radiology;  Laterality: Left;  . RADIOLOGY WITH ANESTHESIA N/A 02/25/2018   Procedure: STENT PLACEMENT;  Surgeon: Luanne Bras, MD;  Location: [place];  Service: Radiology;  Laterality: N/A;  . UMBILICAL HERNIA REPAIR N/A 11/13/2012   Procedure: UMBILICAL HERNIORRHAPHY;  Surgeon: Jamesetta So, MD;  Location: [facility];  Service: General;  Laterality: N/A;  . VARICOCELECTOMY          Home Medications    Prior to Admission medications   Medication Sig Start Date End Date Taking? Authorizing Provider  amLODipine (NORVASC) 5 MG tablet Take 1 tablet (5 mg total) by mouth daily. 10/07/18 01/06/19  Imogene Burn, PA-C  amoxicillin-clavulanate (AUGMENTIN) 875-125 MG tablet Take 1 tablet by mouth every 12 (twelve) hours. 01/23/19   Evalee Jefferson, PA-C  aspirin 81 MG EC tablet Take 1 tablet (81 mg total) by mouth daily. With food 12/05/17   Roxan Hockey, MD  benzonatate (TESSALON) 100 MG capsule Take 2 capsules (200 mg total) by mouth 3 (three) times daily as needed. 01/23/19   Evalee Jefferson, PA-C  Blood Glucose Monitoring Suppl w/Device KIT Dispense based on patient and insurance preference. Check blood sugar daily before breakfast (ICD9 250.0) 04/29/17   Pollina, Gwenyth Allegra, MD  clopidogrel (PLAVIX) 75 MG tablet Take 75 mg by mouth daily.    [provider]  EUTHYROX 200 MCG tablet  Take 200 mcg by mouth daily. 07/20/18   [provider]  EUTHYROX 50 MCG tablet Take 50 mcg by mouth daily. 07/20/18   [provider]  ezetimibe (ZETIA) 10 MG tablet Take 1 tablet (10 mg total) by mouth daily. 12/06/17   Roxan Hockey, MD  famotidine (PEPCID) 20 MG tablet Take 20 mg by mouth 2 (two) times daily.    [provider]  fenofibrate (TRICOR) 145 MG tablet Take 145 mg by mouth daily.    [provider]  glimepiride (AMARYL) 2 MG tablet Take 1 tablet (2 mg total) by mouth daily with breakfast. 11/16/17   Patrecia Pour, MD  nitroGLYCERIN (NITROSTAT) 0.4 MG SL tablet Place 1 tablet (0.4 mg total) under the tongue every 5 (five)  minutes as needed for chest pain. 01/15/18   Strader, Fransisco Hertz, PA-C  ondansetron (ZOFRAN) 4 MG tablet Take 1 tablet (4 mg total) by mouth every 6 (six) hours as needed for nausea or vomiting. 01/11/19   Orpah Greek, MD  oxyCODONE-acetaminophen (PERCOCET) 5-325 MG tablet Take 1-2 tablets by mouth every 4 (four) hours as needed. 01/11/19   Orpah Greek, MD  oxyCODONE-acetaminophen (PERCOCET) 5-325 MG tablet Take 1-2 tablets by mouth every 4 (four) hours as needed. 01/11/19   Orpah Greek, MD  sitaGLIPtin (JANUVIA) 100 MG tablet Take 100 mg by mouth daily.    [provider]  tamsulosin (FLOMAX) 0.4 MG CAPS capsule Take 1 capsule (0.4 mg total) by mouth daily. Take 1 daily until you pass the kidney stone 01/11/19   Pollina, Gwenyth Allegra, MD    Family History Family History  Problem Relation Age of Onset  . Coronary artery disease Father   . Hypertension Father   . Hyperlipidemia Father   . Diabetes Father   . Congestive Heart Failure Father 68  . Arrhythmia Father        had an ICD  . Diabetes Mother   . Hypertension Mother   . Hyperlipidemia Mother   . Obesity Mother 89       died after bariatric surgery, liver failure and infection  . Coronary artery disease Maternal Grandfather         both grandfathers and several uncles  . Coronary artery disease Other   . Diabetes Sister        both sisters  . Stroke Maternal Aunt   . Stroke Maternal Uncle     Social History Social History   Tobacco Use  . Smoking status: Current Some Day Smoker    Packs/day: 0.25    Years: 26.00    Pack years: 6.50    Types: Cigarettes    Start date: 03/13/1985  . Smokeless tobacco: Former User    Types: Snuff, Sarina Ser    Quit date: 03/14/1987  Substance Use Topics  . Alcohol use: No  . Drug use: No     Allergies   Crestor [rosuvastatin], Trulicity [dulaglutide], and Bee venom   Review of Systems Review of Systems  Constitutional: Negative for chills and fever.  HENT: Positive for congestion, rhinorrhea, sinus pressure, sinus pain and sore throat. Negative for ear discharge, ear pain, facial swelling, trouble swallowing and voice change.   Eyes: Negative for discharge.  Respiratory: Positive for cough. Negative for shortness of breath, wheezing and stridor.   Cardiovascular: Negative for chest pain.  Gastrointestinal: Negative for abdominal pain.  Genitourinary: Negative.   Neurological: Positive for headaches.     Physical Exam Updated Vital Signs BP 139/85 (BP Location: Right Arm)   Pulse 70   Temp 99.1 F (37.3 C)   Resp 18   Ht 6' 6"  (1.981 m)   Wt 136.1 kg   SpO2 97%   BMI 34.67 kg/m   Physical Exam Constitutional:      Appearance: He is well-developed.  HENT:     Head: Normocephalic and atraumatic.     Right Ear: Tympanic membrane and ear canal normal.     Left Ear: Tympanic membrane and ear canal normal.     Nose: Mucosal edema present. No rhinorrhea.     Right Sinus: Frontal sinus tenderness present.     Left Sinus: Frontal sinus tenderness present.     Mouth/Throat:     Mouth: Mucous membranes are  moist.     Pharynx: Oropharynx is clear. Uvula midline. No oropharyngeal exudate or posterior oropharyngeal erythema.     Tonsils: No tonsillar abscesses.   Eyes:     Conjunctiva/sclera: Conjunctivae normal.  Cardiovascular:     Rate and Rhythm: Normal rate.     Heart sounds: Normal heart sounds.  Pulmonary:     Effort: Pulmonary effort is normal. No respiratory distress.     Breath sounds: No wheezing or rales.  Abdominal:     Palpations: Abdomen is soft.     Tenderness: There is no abdominal tenderness.  Musculoskeletal: Normal range of motion.  Skin:    General: Skin is warm and dry.     Findings: No rash.  Neurological:     Mental Status: He is alert and oriented to person, place, and time.      ED Treatments / Results  Labs (all labs ordered are listed, but only abnormal results are displayed) Labs Reviewed  SARS CORONAVIRUS 2 (TAT 6-24 HRS)    EKG None  Radiology Dg Chest Portable 1 View  Result Date: 01/23/2019 CLINICAL DATA:  Cough EXAM: PORTABLE CHEST 1 VIEW COMPARISON:  January 05, 2018 FINDINGS: The heart size and mediastinal contours are within normal limits. There is mild prominence to the central pulmonary vasculature. The visualized skeletal structures are unremarkable. IMPRESSION: Mild central pulmonary vascular congestion. Electronically Signed   By: Prudencio Pair M.D.   On: 01/23/2019 20:28    Procedures Procedures (including critical care time)  Medications Ordered in ED Medications  benzonatate (TESSALON) capsule 200 mg (200 mg Oral Given 01/23/19 2006)  amoxicillin-clavulanate (AUGMENTIN) 875-125 MG per tablet 1 tablet (1 tablet Oral Given 01/23/19 2118)     Initial Impression / Assessment and Plan / ED Course  I have reviewed the triage vital signs and the nursing notes.  Pertinent labs & imaging results that were available during my care of the patient were reviewed by me and considered in my medical decision making (see chart for details).        Imaging reviewed and discussed with pt. Lungs clear, exam and hx c/w acute sinusitis.  He was placed on augmentin, tessalon for cough, recommended  coricidin sinus formula for sinus sx. He was screened for covid given his concerns.  Prn f/u anticipated.  Final Clinical Impressions(s) / ED Diagnoses   Final diagnoses:  Acute recurrent sinusitis, unspecified location    ED Discharge Orders         Ordered    benzonatate (TESSALON) 100 MG capsule  3 times daily PRN     01/23/19 2046    amoxicillin-clavulanate (AUGMENTIN) 875-125 MG tablet  Every 12 hours     01/23/19 2046           Evalee Jefferson, PA-C 01/25/19 2018    Fredia Sorrow, MD 02/02/19 [phone]

## 2019-02-11 ENCOUNTER — Telehealth (INDEPENDENT_AMBULATORY_CARE_PROVIDER_SITE_OTHER): Payer: Medicaid Other | Admitting: Cardiology

## 2019-02-11 ENCOUNTER — Encounter: Payer: Self-pay | Admitting: Cardiology

## 2019-02-11 VITALS — Ht 78.0 in | Wt 280.0 lb

## 2019-02-11 DIAGNOSIS — R0602 Shortness of breath: Secondary | ICD-10-CM

## 2019-02-11 DIAGNOSIS — I2510 Atherosclerotic heart disease of native coronary artery without angina pectoris: Secondary | ICD-10-CM

## 2019-02-11 NOTE — Patient Instructions (Signed)
Medication Instructions:  Your physician recommends that you continue on your current medications as directed. Please refer to the Current Medication list given to you today.   Labwork: none  Testing/Procedures: Your physician has recommended that you have a pulmonary function test. Pulmonary Function Tests are a group of tests that measure how well air moves in and out of your lungs.    Follow-Up: Your physician recommends that you schedule a follow-up appointment in: 3 month virtual visit    Any Other Special Instructions Will Be Listed Below (If Applicable).     If you need a refill on your cardiac medications before your next appointment, please call your pharmacy.

## 2019-02-11 NOTE — Addendum Note (Signed)
Addended by: Debbora Lacrosse R on: 02/11/2019 01:40 PM   Modules accepted: Orders

## 2019-02-11 NOTE — Progress Notes (Signed)
Virtual Visit via Telephone Note   This visit type was conducted due to national recommendations for restrictions regarding the COVID-19 Pandemic (e.g. social distancing) in an effort to limit this patient's exposure and mitigate transmission in our community.  Due to his co-morbid illnesses, this patient is at least at moderate risk for complications without adequate follow up.  This format is felt to be most appropriate for this patient at this time.  The patient did not have access to video technology/had technical difficulties with video requiring transitioning to audio format only (telephone).  All issues noted in this document were discussed and addressed.  No physical exam could be performed with this format.  Please refer to the patient's chart for his  consent to telehealth for [facility].   Date:  02/11/2019   ID:  Lisa Roca, DOB Feb 19, 1971, MRN [id number]  Patient Location: Home Provider Location: Office  PCP:  [facility]  Cardiologist:  Carlyle Dolly, MD  Electrophysiologist:  None   Evaluation Performed:  Follow-Up Visit  Chief Complaint:  Follow up  History of Present Illness:    Devin [name] is a 48 y.o. male seen today for a focused follow up for the following medical problems.    1. CAD/Chest pain/Chronic systolic HF - admitted 10/5460 with chest pain concerning for unstable angina.  11/2017 coronary CTA with severe prox LAD disease and possible mid LAD disease and D1.  11/2017 cath LM patent, LAD mid 80 to 90%, LCX distal 90%, OM2 90%, LPDA 90%, RCA small with prox 99% LVgram 35-45% (no echo on file).DES x 2to LAD, DES to OM, trifurcation lesion distal LCX, LPL and PDA recs for medical therapy, poor pci target. RCA small vessel treated medically.   Interventional cards recommended extended DAPT  01/2018 echo: LVEF 35-40%, grade I diastoluc dysfunction - bradycardia on beta blocketers. History of rhabdo on crestor.Low bp  and dizzienss on entresto.    10/2018 nuclear stress: no clear ischemia. LVEF 46% - he was to stop brillinta and start plavix due to SOB. Appears from his pill bottles he is taking ASA, plavix and brillinta mistakingly.   - since last visits stopped brillinta, breathing has not improved. No recent chest pain.       The patient does not have symptoms concerning for COVID-19 infection (fever, chills, cough, or new shortness of breath).    Past Medical History:  Diagnosis Date  . Asthma   . Chest pain   . CHF (congestive heart failure) ([place])   . Coronary artery disease    a. s/p DES x2 to LAD and DES to OM in 11/2017  . Depression   . Dyspnea   . GERD (gastroesophageal reflux disease)   . Headache   . History of kidney stones   . History of rhabdomyolysis   . Hyperlipidemia   . Hypertension   . Hypothyroidism   . Ischemic cardiomyopathy    a. 11/2017: echo showing EF of 35-40%, diffuse HK, and Grade 2 DD  . Kidney calculus 2014  . Pericardial effusion    Small, by dobutamine echocardiogram, 04/2006  . PONV (postoperative nausea and vomiting)   . Sleep apnea    does not wear cpap  . Stroke ([place])   . Tobacco abuse   . Type 2 diabetes mellitus without complications ([place]) 09/13/5007  . Vitamin D deficiency    Past Surgical History:  Procedure Laterality Date  . APPENDECTOMY    . 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 CHOLECYSTECTOMY N/A 01/27/2013   Procedure: LAPAROSCOPIC CHOLECYSTECTOMY;  Surgeon: Dalia Heading, MD;  Location: [facility];  Service: General;  Laterality: N/A;  . CORONARY STENT INTERVENTION N/A 11/15/2017   Procedure: CORONARY STENT INTERVENTION;  Surgeon: Marykay Lex, MD;  Location: [facility] INVASIVE CV LAB;  Service: Cardiovascular;  Laterality: N/A;  . HERNIA REPAIR     As a child  . INSERTION OF MESH N/A 11/13/2012   Procedure: INSERTION OF MESH;  Surgeon: Dalia Heading, MD;  Location: [facility];  Service: General;  Laterality: N/A;  . IR ANGIO INTRA EXTRACRAN SEL COM CAROTID INNOMINATE BILAT MOD SED  12/01/2017  . IR ANGIO INTRA EXTRACRAN SEL COM CAROTID INNOMINATE BILAT MOD SED  02/13/2018  . IR ANGIO VERTEBRAL SEL SUBCLAVIAN INNOMINATE UNI L MOD SED  02/13/2018  . IR ANGIO VERTEBRAL SEL VERTEBRAL BILAT MOD SED  12/01/2017  . IR ANGIO VERTEBRAL SEL VERTEBRAL UNI R MOD SED  02/13/2018  . IR INTRA CRAN STENT  12/03/2017  . IR PTA INTRACRANIAL  02/25/2018  . IR US GUIDE VASC ACCESS RIGHT  02/13/2018  . KIDNEY STONE SURGERY    . LEFT HEART CATH AND CORONARY ANGIOGRAPHY N/A 11/15/2017   Procedure: LEFT HEART CATH AND CORONARY ANGIOGRAPHY;  Surgeon: Marykay Lex, MD;  Location: [facility] INVASIVE CV LAB;  Service: Cardiovascular;  Laterality: N/A;  . PERCUTANEOUS NEPHROLITHOTRIPSY    . RADIOLOGY WITH ANESTHESIA Left 12/03/2017   Procedure: Angioplasty with possible stenting of left VBJ;  Surgeon: Julieanne Cotton, MD;  Location: [facility] OR;  Service: Radiology;  Laterality: Left;  . RADIOLOGY WITH ANESTHESIA N/A 02/25/2018   Procedure: STENT PLACEMENT;  Surgeon: Julieanne Cotton, MD;  Location: [facility] OR;  Service: Radiology;  Laterality: N/A;  . UMBILICAL HERNIA REPAIR N/A 11/13/2012   Procedure: UMBILICAL HERNIORRHAPHY;  Surgeon: Dalia Heading, MD;  Location: [facility];  Service: General;  Laterality: N/A;  . VARICOCELECTOMY       No outpatient medications have been marked as taking for the 02/11/19 encounter (Appointment) with Antoine Poche, MD.     Allergies:   Crestor [rosuvastatin], Trulicity [dulaglutide], and Bee venom   Social History   Tobacco Use  . Smoking status: Current Some Day Smoker    Packs/day: 0.25    Years: 26.00    Pack years: 6.50    Types: Cigarettes    Start date: 03/13/1985  . Smokeless tobacco: Former User    Types: Snuff, Dorna Bloom    Quit date: 03/14/1987  Substance Use Topics  . Alcohol use: No  . Drug use: No     Family Hx: The  patient's family history includes Arrhythmia in his father; Congestive Heart Failure (age of onset: 64) in his father; Coronary artery disease in his father, maternal grandfather, and another family member; Diabetes in his father, mother, and sister; Hyperlipidemia in his father and mother; Hypertension in his father and mother; Obesity (age of onset: 34) in his mother; Stroke in his maternal aunt and maternal uncle.  ROS:   Please see the history of present illness.     All other systems reviewed and are negative.   Prior CV studies:   The following studies were reviewed today:  11/2017 cath  There is moderate left ventricular systolic dysfunction. The left ventricular ejection fraction is 35-45% by visual estimate.  LV end diastolic pressure is mildly elevated.  -----------------------------------------  ANGIOGRAPHY- PCI  LESION #1: Mid LAD lesion is 80% stenosed.  A drug-eluting stent was successfully placed using a STENT SYNERGY DES 2.25X16. -Postdilated to 2.4 mm  Post intervention, there is a 0% residual stenosis.  LESION #2: mid LAD to Dist LAD lesion is 90% stenosed.  A drug-eluting stent was successfully placed using a STENT SYNERGY DES 2.25X12. -Postdilated to 2.4 mm  Post intervention, there is a 0% residual stenosis.  Prox LAD lesion is 40% stenosed -prior to lesion 1; apical, after lesion 2  A drug-eluting stent was successfully placed using a STENT SYNERGY DES 2.25X12. -Postdilated to 2.4 mm  -----------------------------------------  Post intervention, there is a 0% residual stenosis.  Trifurcation 90% lesion at the distal circumflex into LPL and PDA -small caliber distal vessels. Best treated medically as this is not a very good PCI target  Prox small caliber nondominant RCA lesion is 99% stenosed.  Severe distal OM 2 -DES PCI with Synergy 2.25 mm x16 mm (2.4 mm); severe trifurcation disease with a very distal AV groove circumflex prior to PDA and PL   -medical management.  Subtotally occluded nondominant RCA Moderately reduced LVEF of roughly 40% with mildly elevated LVEDP.  Plan: Transfer to postprocedure unit for post PCI monitoring and TR removal Continue aggressive risk factor modification with statin and beta-blocker etc.  Recommend uninterrupted dual antiplatelet therapy with Aspirin 81mg  daily and Ticagrelor 90mg  twice dailyfor a minimum of 12 months (ACS - Class I recommendation).-Would strongly consider reducing to 60 mg dose ticagrelor at 9-12 months and continue for an additional year.  Would be okay to stop aspirin after 3 months if necessary. Okay to stop Brilinta after 6 months for procedures if necessary.   10/2018 nuclear stress  There was no ST segment deviation noted during stress.  Defect 1: There is a small defect of mild severity present in the mid inferoseptal location. This is likely due to soft tissue attenuation artifact.  This is a low risk study. No ischemic zones.  Nuclear stress EF: 46  Labs/Other Tests and Data Reviewed:    EKG:  No ECG reviewed.  Recent Labs: 07/27/2018: Magnesium 1.7 07/29/2018: ALT 40 01/11/2019: BUN 17; Creatinine, Ser 1.03; Hemoglobin 15.6; Platelets 298; Potassium 3.6; Sodium 135   Recent Lipid Panel Lab Results  Component Value Date/Time   CHOL 211 (H) 02/11/2018 06:15 AM   TRIG 391 (H) 02/11/2018 06:15 AM   HDL 25 (L) 02/11/2018 06:15 AM   CHOLHDL 8.4 02/11/2018 06:15 AM   LDLCALC 108 (H) 02/11/2018 06:15 AM    Wt Readings from Last 3 Encounters:  01/23/19 300 lb (136.1 kg)  01/11/19 270 lb (122.5 kg)  01/06/19 274 lb (124.3 kg)     Objective:    Vital Signs:   Today's Vitals   02/11/19 1258  Weight: 280 lb (127 kg)  Height: 6\' 6"  (1.981 m)   Body mass index is 32.36 kg/m. Normal affect. Normal speech pattern and tone. Comfortable,no apparent distress. No audible signs of SOB or wheezing.   ASSESSMENT & PLAN:    1. CAD/ICM/SOB - ongoing  SOB I don't think is cardiac related but more related to his smoking history and likely undiagnosied COPD. We will obtain PFTs - continue current cardiac meds.     COVID-19 Education: The signs and symptoms of COVID-19 were discussed with the patient and how to seek care for testing (follow up with PCP or arrange E-visit).  The importance of social distancing was discussed today.  Time:  Today, I have spent 14 minutes with the patient with telehealth technology discussing the above problems.     Medication Adjustments/Labs and Tests Ordered: Current medicines are reviewed at length with the patient today.  Concerns regarding medicines are outlined above.   Tests Ordered: No orders of the defined types were placed in this encounter.   Medication Changes: No orders of the defined types were placed in this encounter.   Follow Up:  Virtual Visit  in 3 month(s)  Signed, Dina Rich, MD  02/11/2019 12:01 PM    [facility]

## 2019-02-19 ENCOUNTER — Encounter (HOSPITAL_COMMUNITY): Payer: Self-pay | Admitting: *Deleted

## 2019-02-19 ENCOUNTER — Other Ambulatory Visit: Payer: Self-pay

## 2019-02-19 ENCOUNTER — Emergency Department (HOSPITAL_COMMUNITY)
Admission: EM | Admit: 2019-02-19 | Discharge: 2019-02-19 | Disposition: A | Discharge status: Home / Self Care | Payer: Medicaid Other | Attending: Emergency Medicine | Admitting: Emergency Medicine

## 2019-02-19 ENCOUNTER — Emergency Department (HOSPITAL_COMMUNITY): Payer: Medicaid Other

## 2019-02-19 DIAGNOSIS — M722 Plantar fascial fibromatosis: Secondary | ICD-10-CM | POA: Diagnosis not present

## 2019-02-19 DIAGNOSIS — I130 Hypertensive heart and chronic kidney disease with heart failure and stage 1 through stage 4 chronic kidney disease, or unspecified chronic kidney disease: Secondary | ICD-10-CM | POA: Diagnosis not present

## 2019-02-19 DIAGNOSIS — J45909 Unspecified asthma, uncomplicated: Secondary | ICD-10-CM | POA: Diagnosis not present

## 2019-02-19 DIAGNOSIS — I5042 Chronic combined systolic (congestive) and diastolic (congestive) heart failure: Secondary | ICD-10-CM | POA: Diagnosis not present

## 2019-02-19 DIAGNOSIS — I110 Hypertensive heart disease with heart failure: Secondary | ICD-10-CM | POA: Insufficient documentation

## 2019-02-19 DIAGNOSIS — Y9289 Other specified places as the place of occurrence of the external cause: Secondary | ICD-10-CM | POA: Diagnosis not present

## 2019-02-19 DIAGNOSIS — Z7984 Long term (current) use of oral hypoglycemic drugs: Secondary | ICD-10-CM | POA: Diagnosis not present

## 2019-02-19 DIAGNOSIS — Y9389 Activity, other specified: Secondary | ICD-10-CM | POA: Diagnosis not present

## 2019-02-19 DIAGNOSIS — E039 Hypothyroidism, unspecified: Secondary | ICD-10-CM | POA: Diagnosis not present

## 2019-02-19 DIAGNOSIS — S99922A Unspecified injury of left foot, initial encounter: Secondary | ICD-10-CM | POA: Diagnosis present

## 2019-02-19 DIAGNOSIS — N189 Chronic kidney disease, unspecified: Secondary | ICD-10-CM | POA: Insufficient documentation

## 2019-02-19 DIAGNOSIS — Z7902 Long term (current) use of antithrombotics/antiplatelets: Secondary | ICD-10-CM | POA: Diagnosis not present

## 2019-02-19 DIAGNOSIS — Y999 Unspecified external cause status: Secondary | ICD-10-CM | POA: Insufficient documentation

## 2019-02-19 DIAGNOSIS — W268XXA Contact with other sharp object(s), not elsewhere classified, initial encounter: Secondary | ICD-10-CM | POA: Diagnosis not present

## 2019-02-19 DIAGNOSIS — E1122 Type 2 diabetes mellitus with diabetic chronic kidney disease: Secondary | ICD-10-CM | POA: Diagnosis not present

## 2019-02-19 NOTE — ED Triage Notes (Signed)
Pt c/o pain and swelling to left lateral foot. Pt reports he stepped on a rock and his "foot snapped". Reports it felt better for a few days and now it's hurting again. Pt ambulatory in triage.

## 2019-02-19 NOTE — ED Provider Notes (Signed)
[facility] EMERGENCY DEPARTMENT Provider Note   CSN: [id number] Arrival date & time: 02/19/19  1315     History   Chief Complaint Chief Complaint  Patient presents with  . Foot Injury   HPI Devin [name] is a 48 y.o. male.  Patient is a 48y/o male with PMH of CAD, CHF, Asthma, GERD, HLD, HTN, and T2DM presenting for left foot pain. He states several weeks ago he "stepped on a rock or something" and felt like his foot got bent "up and backwards". If hurt sharply for only a short period of time and then went away. However, a few days later the bottom and lateral side of his foot began hurting and it has slowly become worse. It is very bad early in the morning when he first tries to walk on it after getting out of bed. He does endorse feeling like something "snapped" and has been limping as the pain has gotten worse. He is very tender along the bottom of his foot. No swelling or bruising.  Past Medical History:  Diagnosis Date  . Asthma   . Chest pain   . CHF (congestive heart failure) ([place])   . Coronary artery disease    a. s/p DES x2 to LAD and DES to OM in 11/2017  . Depression   . Dyspnea   . GERD (gastroesophageal reflux disease)   . Headache   . History of kidney stones   . History of rhabdomyolysis   . Hyperlipidemia   . Hypertension   . Hypothyroidism   . Ischemic cardiomyopathy    a. 11/2017: echo showing EF of 35-40%, diffuse HK, and Grade 2 DD  . Kidney calculus 2014  . Pericardial effusion    Small, by dobutamine echocardiogram, 04/2006  . PONV (postoperative nausea and vomiting)   . Sleep apnea    does not wear cpap  . Stroke ([place])   . Tobacco abuse   . Type 2 diabetes mellitus without complications ([place]) 11/27/6058  . Vitamin D deficiency     Patient Active Problem List   Diagnosis Date Noted  . Ischemic cardiomyopathy 10/02/2018  . 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  Acute cerebrovascular accident (CVA) ([place]) 02/10/2018  . Chronic combined systolic and diastolic CHF (congestive heart failure) ([place]) 01/06/2018  . Altered mental status 01/05/2018  . History of stroke 01/05/2018  . 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 Cerebellar stroke ([place])   . TIA (transient ischemic attack) 11/30/2017  . Abnormal cardiac CT angiography   . Coronary artery disease   . Chest pain due to CAD ([place]) 11/14/2017  . Chest pain with moderate risk for cardiac etiology   . CKD (chronic kidney disease) 11/13/2017  . Hypothyroidism 10/07/2013  . Type 2 diabetes mellitus without complications ([place]) 04/59/9774  . Acute renal failure ([place]) 10/06/2013  . Elevated AST (SGOT) 10/06/2013  . Elevated ALT measurement 10/06/2013  . Chest pain   . Tobacco abuse   . Hypertension   . Hyperlipidemia   . Pericardial effusion   . History of rhabdomyolysis     Past Surgical History:  Procedure Laterality Date  . APPENDECTOMY    . 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 CHOLECYSTECTOMY N/A 01/27/2013   Procedure: LAPAROSCOPIC CHOLECYSTECTOMY;  Surgeon: Jamesetta So, MD;  Location: [facility];  Service: General;  Laterality: N/A;  .  CORONARY STENT INTERVENTION N/A 11/15/2017   Procedure: CORONARY STENT INTERVENTION;  Surgeon: Leonie Man, MD;  Location: [facility];  Service: Cardiovascular;  Laterality: N/A;  . HERNIA REPAIR     As a child  . INSERTION OF MESH N/A 11/13/2012   Procedure: INSERTION OF MESH;  Surgeon: Jamesetta So, MD;  Location: [facility];  Service: General;  Laterality: N/A;  . IR ANGIO INTRA EXTRACRAN SEL COM CAROTID INNOMINATE BILAT MOD SED  12/01/2017  . IR ANGIO INTRA EXTRACRAN SEL COM CAROTID INNOMINATE BILAT MOD SED  02/13/2018  . IR ANGIO VERTEBRAL SEL SUBCLAVIAN INNOMINATE UNI L MOD SED  02/13/2018  . IR ANGIO VERTEBRAL SEL VERTEBRAL BILAT MOD SED  12/01/2017  . IR ANGIO  VERTEBRAL SEL VERTEBRAL UNI R MOD SED  02/13/2018  . IR INTRA CRAN STENT  12/03/2017  . IR PTA INTRACRANIAL  02/25/2018  . IR US GUIDE VASC ACCESS RIGHT  02/13/2018  . KIDNEY STONE SURGERY    . LEFT HEART CATH AND CORONARY ANGIOGRAPHY N/A 11/15/2017   Procedure: LEFT HEART CATH AND CORONARY ANGIOGRAPHY;  Surgeon: Leonie Man, MD;  Location: [place] [facility];  Service: Cardiovascular;  Laterality: N/A;  . PERCUTANEOUS NEPHROLITHOTRIPSY    . RADIOLOGY WITH ANESTHESIA Left 12/03/2017   Procedure: Angioplasty with possible stenting of left VBJ;  Surgeon: Luanne Bras, MD;  Location: [place];  Service: Radiology;  Laterality: Left;  . RADIOLOGY WITH ANESTHESIA N/A 02/25/2018   Procedure: STENT PLACEMENT;  Surgeon: Luanne Bras, MD;  Location: [place];  Service: Radiology;  Laterality: N/A;  . UMBILICAL HERNIA REPAIR N/A 11/13/2012   Procedure: UMBILICAL HERNIORRHAPHY;  Surgeon: Jamesetta So, MD;  Location: [facility];  Service: General;  Laterality: N/A;  . VARICOCELECTOMY        Home Medications    Prior to Admission medications   Medication Sig Start Date End Date Taking? Authorizing Provider  amLODipine (NORVASC) 5 MG tablet Take 1 tablet (5 mg total) by mouth daily. 10/07/18 02/11/19  Imogene Burn, PA-C  amoxicillin-clavulanate (AUGMENTIN) 875-125 MG tablet Take 1 tablet by mouth every 12 (twelve) hours. 01/23/19   Evalee Jefferson, PA-C  aspirin 81 MG EC tablet Take 1 tablet (81 mg total) by mouth daily. With food 12/05/17   Roxan Hockey, MD  benzonatate (TESSALON) 100 MG capsule Take 2 capsules (200 mg total) by mouth 3 (three) times daily as needed. 01/23/19   Evalee Jefferson, PA-C  Blood Glucose Monitoring Suppl w/Device KIT Dispense based on patient and insurance preference. Check blood sugar daily before breakfast (ICD9 250.0) 04/29/17   Pollina, Gwenyth Allegra, MD  clopidogrel (PLAVIX) 75 MG tablet Take 75 mg by mouth daily.    [provider]  EUTHYROX 200 MCG tablet Take  200 mcg by mouth daily. 07/20/18   [provider]  EUTHYROX 50 MCG tablet Take 50 mcg by mouth daily. 07/20/18   [provider]  ezetimibe (ZETIA) 10 MG tablet Take 1 tablet (10 mg total) by mouth daily. 12/06/17   Roxan Hockey, MD  famotidine (PEPCID) 20 MG tablet Take 20 mg by mouth 2 (two) times daily.    [provider]  fenofibrate (TRICOR) 145 MG tablet Take 145 mg by mouth daily.    [provider]  glimepiride (AMARYL) 2 MG tablet Take 1 tablet (2 mg total) by mouth daily with breakfast. 11/16/17   Patrecia Pour, MD  nitroGLYCERIN (NITROSTAT) 0.4 MG SL tablet Place 1 tablet (0.4  mg total) under the tongue every 5 (five) minutes as needed for chest pain. 01/15/18   Strader, Fransisco Hertz, PA-C  ondansetron (ZOFRAN) 4 MG tablet Take 1 tablet (4 mg total) by mouth every 6 (six) hours as needed for nausea or vomiting. 01/11/19   Orpah Greek, MD  oxyCODONE-acetaminophen (PERCOCET) 5-325 MG tablet Take 1-2 tablets by mouth every 4 (four) hours as needed. 01/11/19   Orpah Greek, MD  oxyCODONE-acetaminophen (PERCOCET) 5-325 MG tablet Take 1-2 tablets by mouth every 4 (four) hours as needed. 01/11/19   Orpah Greek, MD  sitaGLIPtin (JANUVIA) 100 MG tablet Take 100 mg by mouth daily.    [provider]  tamsulosin (FLOMAX) 0.4 MG CAPS capsule Take 1 capsule (0.4 mg total) by mouth daily. Take 1 daily until you pass the kidney stone 01/11/19   Pollina, Gwenyth Allegra, MD    Family History Family History  Problem Relation Age of Onset  . Coronary artery disease Father   . Hypertension Father   . Hyperlipidemia Father   . Diabetes Father   . Congestive Heart Failure Father 97  . Arrhythmia Father        had an ICD  . Diabetes Mother   . Hypertension Mother   . Hyperlipidemia Mother   . Obesity Mother 43       died after bariatric surgery, liver failure and infection  . Coronary artery disease Maternal Grandfather         both grandfathers and several uncles  . Coronary artery disease Other   . Diabetes Sister        both sisters  . Stroke Maternal Aunt   . Stroke Maternal Uncle     Social History Social History   Tobacco Use  . Smoking status: Current Every Day Smoker    Packs/day: 0.50    Years: 26.00    Pack years: 13.00    Types: Cigarettes    Start date: 03/13/1985  . Smokeless tobacco: Former User    Types: Snuff, Sarina Ser    Quit date: 03/14/1987  Substance Use Topics  . Alcohol use: No  . Drug use: No     Allergies   Crestor [rosuvastatin], Trulicity [dulaglutide], and Bee venom   Review of Systems Review of Systems  Constitutional: Negative for appetite change, chills, fatigue and fever.  HENT: Negative for congestion, rhinorrhea, sinus pressure, sneezing and sore throat.   Respiratory: Negative for cough, chest tightness, shortness of breath and wheezing.   Cardiovascular: Negative for chest pain.  Gastrointestinal: Negative for abdominal pain, diarrhea, nausea and vomiting.  Musculoskeletal: Positive for gait problem and myalgias. Negative for arthralgias and joint swelling.  Skin: Negative for color change and rash.    Physical Exam Updated Vital Signs BP (!) 164/96 (BP Location: Right Arm)   Pulse 86   Temp 98.9 F (37.2 C) (Oral)   Resp 18   Ht _0  (1.981 m)   Wt 125.2 kg   SpO2 98%   BMI 31.90 kg/m   Physical Exam Vitals signs reviewed.  Constitutional:      General: He is not in acute distress.    Appearance: Normal appearance. He is not ill-appearing.  HENT:     Head: Normocephalic and atraumatic.  Eyes:     Extraocular Movements: Extraocular movements intact.     Conjunctiva/sclera: Conjunctivae normal.     Pupils: Pupils are equal, round, and reactive to light.  Cardiovascular:     Pulses: Normal pulses.  Pulmonary:     Effort: Pulmonary effort is normal.  Musculoskeletal:     Left ankle: He exhibits normal range of motion, no swelling, no ecchymosis,  no deformity and normal pulse. Tenderness. Lateral malleolus and head of 5th metatarsal tenderness found. No medial malleolus and no AITFL tenderness found. Achilles tendon exhibits no pain and normal Thompson's test results.  Skin:    General: Skin is warm and dry.     Findings: No bruising or erythema.     Comments: Ankle/Foot, Left: TTP noted at the insertion of the plantar fascia, lateral malleolus, and base of 5th metatarsal. No visible erythema, swelling, ecchymosis, or bony deformity. Mild notable pes planus/cavus deformity. Transverse arch grossly intact; No evidence of tibiotalar deviation; Range of motion is full in all directions. Strength is 5/5 in all directions. No tenderness at the insertion/body/myotendinous junction of the Achilles tendon; No peroneal tendon tenderness or subluxation; Stable lateral and medial ligaments; Unremarkable squeeze and kleiger tests; Talar dome nontender; Unremarkable calcaneal squeeze; No tenderness over the navicular prominence; No tenderness over cuboid; Pain at base of 5th MT; No tenderness at the distal metatarsals; Able to walk 4 steps with limp.    Neurological:     Mental Status: He is alert.     ED Treatments / Results  Labs (all labs ordered are listed, but only abnormal results are displayed) Labs Reviewed - No data to display  EKG None  Radiology Dg Ankle Complete Left  Result Date: 02/19/2019 CLINICAL DATA:  Acute left ankle pain and swelling after injury. EXAM: LEFT ANKLE COMPLETE - 3+ VIEW COMPARISON:  None. FINDINGS: There is no evidence of fracture, dislocation, or joint effusion. There is no evidence of arthropathy or other focal bone abnormality. Soft tissues are unremarkable. IMPRESSION: Negative. Electronically Signed   By: Marijo Conception M.D.   On: 02/19/2019 14:52   Dg Foot Complete Left  Result Date: 02/19/2019 CLINICAL DATA:  Pain and swelling lateral left foot EXAM: LEFT FOOT - COMPLETE 3+ VIEW COMPARISON:  None.  FINDINGS: No fracture or dislocation of the left foot. Accessory os perineum adjacent to the cuboid, a normal variant. Joint spaces are well preserved. Soft tissues are unremarkable. IMPRESSION: No fracture or dislocation of the left foot. Electronically Signed   By: Eddie Candle M.D.   On: 02/19/2019 14:53    Procedures Procedures (including critical care time)  Medications Ordered in ED Medications - No data to display   Initial Impression / Assessment and Plan / ED Course  I have reviewed the triage vital signs and the nursing notes.  Pertinent labs & imaging results that were available during my care of the patient were reviewed by me and considered in my medical decision making (see chart for details).  Patient is a 48y/o male with significant medical history of CAD presenting for left foot pain for several weeks getting worse. [place] ankle score of 2 so obtained X-rays of foot and ankle which showed no fracture or dislocation. Patient was given home exercises to do, normally would give NSAIDs but given heart history instructed patient to take OTC Tylenol extra strength. Follow up with PCP.  Final Clinical Impressions(s) / ED Diagnoses   Final diagnoses:  Plantar fasciitis of left foot    ED Discharge Orders    None       Nuala Alpha, DO 02/19/19 1540    Elnora Morrison, MD 02/19/19 1557

## 2019-03-10 ENCOUNTER — Other Ambulatory Visit: Payer: Self-pay

## 2019-03-10 ENCOUNTER — Other Ambulatory Visit (HOSPITAL_COMMUNITY)
Admission: RE | Admit: 2019-03-10 | Discharge: 2019-03-10 | Disposition: A | Discharge status: Home / Self Care | Payer: Medicaid Other | Source: Ambulatory Visit | Attending: Cardiology | Admitting: Cardiology

## 2019-03-10 ENCOUNTER — Ambulatory Visit: Payer: Medicaid Other

## 2019-03-10 DIAGNOSIS — Z01812 Encounter for preprocedural laboratory examination: Secondary | ICD-10-CM | POA: Insufficient documentation

## 2019-03-10 DIAGNOSIS — Z20828 Contact with and (suspected) exposure to other viral communicable diseases: Secondary | ICD-10-CM | POA: Diagnosis not present

## 2019-03-10 LAB — SARS CORONAVIRUS 2 (TAT 6-24 HRS): SARS Coronavirus 2: NEGATIVE

## 2019-03-13 ENCOUNTER — Other Ambulatory Visit: Payer: Self-pay

## 2019-03-13 ENCOUNTER — Ambulatory Visit (HOSPITAL_COMMUNITY)
Admission: RE | Admit: 2019-03-13 | Discharge: 2019-03-13 | Disposition: A | Discharge status: Home / Self Care | Payer: Medicaid Other | Source: Ambulatory Visit | Attending: Cardiology | Admitting: Cardiology

## 2019-03-13 DIAGNOSIS — R0602 Shortness of breath: Secondary | ICD-10-CM | POA: Diagnosis present

## 2019-03-13 LAB — PULMONARY FUNCTION TEST
DL/VA % pred: 92 %
DL/VA: 4.01 ml/min/mmHg/L
DLCO unc % pred: 55 %
DLCO unc: 20.93 ml/min/mmHg
FEF 25-75 Post: 2.79 L/sec
FEF 25-75 Pre: 2.58 L/sec
FEF2575-%Change-Post: 8 %
FEF2575-%Pred-Post: 63 %
FEF2575-%Pred-Pre: 58 %
FEV1-%Change-Post: 4 %
FEV1-%Pred-Post: 63 %
FEV1-%Pred-Pre: 60 %
FEV1-Post: 3.25 L
FEV1-Pre: 3.1 L
FEV1FVC-%Change-Post: 1 %
FEV1FVC-%Pred-Pre: 83 %
FEV6-%Change-Post: -3 %
FEV6-%Pred-Post: 71 %
FEV6-%Pred-Pre: 74 %
FEV6-Post: 4.53 L
FEV6-Pre: 4.72 L
FEV6FVC-%Change-Post: 0 %
FEV6FVC-%Pred-Post: 102 %
FEV6FVC-%Pred-Pre: 103 %
FVC-%Change-Post: 2 %
FVC-%Pred-Post: 73 %
FVC-%Pred-Pre: 71 %
FVC-Post: 4.85 L
FVC-Pre: 4.72 L
Post FEV1/FVC ratio: 67 %
Post FEV6/FVC ratio: 100 %
Pre FEV1/FVC ratio: 66 %
Pre FEV6/FVC Ratio: 100 %
RV % pred: 125 %
RV: 3.03 L
TLC % pred: 90 %
TLC: 7.72 L

## 2019-03-13 MED ORDER — ALBUTEROL SULFATE (2.5 MG/3ML) 0.083% IN NEBU
2.5000 mg | INHALATION_SOLUTION | Freq: Once | RESPIRATORY_TRACT | Status: AC
  Administered 2019-03-13: 2.5 mg via RESPIRATORY_TRACT

## 2019-03-21 ENCOUNTER — Telehealth: Payer: Self-pay

## 2019-03-21 MED ORDER — SPIRIVA HANDIHALER 18 MCG IN CAPS: 18.0000 ug | ORAL_CAPSULE | Freq: Every morning | RESPIRATORY_TRACT | 12 refills | Status: DC

## 2019-03-21 NOTE — Telephone Encounter (Signed)
-----   Message from Antoine Poche, MD sent at 03/20/2019 12:15 PM EST ----- Breathing tests show moderate COPD, this likely is the cause of his ongoing SOB. Can we start him on spiriva once daily, forward results to pcp   Dominga Ferry MD

## 2019-03-21 NOTE — Telephone Encounter (Signed)
I spoke with sister, Olegario Messier, results given.Will e-scribe Spiriva inhaler to [facility]

## 2019-04-16 ENCOUNTER — Telehealth: Payer: Self-pay

## 2019-04-16 ENCOUNTER — Other Ambulatory Visit: Payer: Self-pay

## 2019-04-16 DIAGNOSIS — R0609 Other forms of dyspnea: Secondary | ICD-10-CM

## 2019-04-16 DIAGNOSIS — E782 Mixed hyperlipidemia: Secondary | ICD-10-CM

## 2019-04-16 DIAGNOSIS — R7309 Other abnormal glucose: Secondary | ICD-10-CM

## 2019-04-16 NOTE — Telephone Encounter (Signed)
-----   Message from Jonathan F Branch, MD sent at 04/16/2019  3:15 PM EST ----- Can we get a lipid panel, TSH, Hgb A1c for this patient. Make sure he knows must be fasting, only can have water the morning of   J Branch MD 

## 2019-04-16 NOTE — Telephone Encounter (Signed)
-----   Message from Antoine Poche, MD sent at 04/16/2019  3:15 PM EST ----- Can we get a lipid panel, TSH, Hgb A1c for this patient. Make sure he knows must be fasting, only can have water the morning of   Dominga Ferry MD

## 2019-04-16 NOTE — Telephone Encounter (Signed)
Called pt. No answer. Left message for pt to return call. Lab order placed.

## 2019-05-15 ENCOUNTER — Emergency Department (HOSPITAL_COMMUNITY): Payer: Medicaid Other

## 2019-05-15 ENCOUNTER — Encounter (HOSPITAL_COMMUNITY): Payer: Self-pay | Admitting: Emergency Medicine

## 2019-05-15 ENCOUNTER — Emergency Department (HOSPITAL_COMMUNITY)
Admission: EM | Admit: 2019-05-15 | Discharge: 2019-05-15 | Disposition: A | Discharge status: Home / Self Care | Payer: Medicaid Other | Attending: Emergency Medicine | Admitting: Emergency Medicine

## 2019-05-15 ENCOUNTER — Other Ambulatory Visit: Payer: Self-pay

## 2019-05-15 DIAGNOSIS — E1122 Type 2 diabetes mellitus with diabetic chronic kidney disease: Secondary | ICD-10-CM | POA: Diagnosis not present

## 2019-05-15 DIAGNOSIS — Z7982 Long term (current) use of aspirin: Secondary | ICD-10-CM | POA: Diagnosis not present

## 2019-05-15 DIAGNOSIS — E039 Hypothyroidism, unspecified: Secondary | ICD-10-CM | POA: Diagnosis not present

## 2019-05-15 DIAGNOSIS — Z79899 Other long term (current) drug therapy: Secondary | ICD-10-CM | POA: Diagnosis not present

## 2019-05-15 DIAGNOSIS — R739 Hyperglycemia, unspecified: Secondary | ICD-10-CM

## 2019-05-15 DIAGNOSIS — J45909 Unspecified asthma, uncomplicated: Secondary | ICD-10-CM | POA: Insufficient documentation

## 2019-05-15 DIAGNOSIS — F17210 Nicotine dependence, cigarettes, uncomplicated: Secondary | ICD-10-CM | POA: Insufficient documentation

## 2019-05-15 DIAGNOSIS — Z7984 Long term (current) use of oral hypoglycemic drugs: Secondary | ICD-10-CM | POA: Insufficient documentation

## 2019-05-15 DIAGNOSIS — I2510 Atherosclerotic heart disease of native coronary artery without angina pectoris: Secondary | ICD-10-CM | POA: Insufficient documentation

## 2019-05-15 DIAGNOSIS — N189 Chronic kidney disease, unspecified: Secondary | ICD-10-CM | POA: Insufficient documentation

## 2019-05-15 DIAGNOSIS — N201 Calculus of ureter: Secondary | ICD-10-CM | POA: Diagnosis not present

## 2019-05-15 DIAGNOSIS — N50811 Right testicular pain: Secondary | ICD-10-CM | POA: Diagnosis present

## 2019-05-15 DIAGNOSIS — I129 Hypertensive chronic kidney disease with stage 1 through stage 4 chronic kidney disease, or unspecified chronic kidney disease: Secondary | ICD-10-CM | POA: Insufficient documentation

## 2019-05-15 LAB — URINALYSIS, ROUTINE W REFLEX MICROSCOPIC
Bilirubin Urine: NEGATIVE
Glucose, UA: >=500 mg/dL — AB
Ketones, ur: NEGATIVE mg/dL
Leukocytes,Ua: NEGATIVE
Nitrite: NEGATIVE
Protein, ur: 30 mg/dL — AB
Specific Gravity, Urine: 1.025 (ref 1.005–1.030)
pH: 5 (ref 5.0–8.0)

## 2019-05-15 LAB — CBC WITH DIFFERENTIAL/PLATELET
Abs Immature Granulocytes: 0.05 10*3/uL (ref 0.00–0.07)
Basophils Absolute: 0 10*3/uL (ref 0.0–0.1)
Basophils Relative: 0 %
Eosinophils Absolute: 0.2 10*3/uL (ref 0.0–0.5)
Eosinophils Relative: 2 %
HCT: 48 % (ref 39.0–52.0)
Hemoglobin: 15.6 g/dL (ref 13.0–17.0)
Immature Granulocytes: 1 %
Lymphocytes Relative: 22 %
Lymphs Abs: 2.3 10*3/uL (ref 0.7–4.0)
MCH: 29.1 pg (ref 26.0–34.0)
MCHC: 32.5 g/dL (ref 30.0–36.0)
MCV: 89.6 fL (ref 80.0–100.0)
Monocytes Absolute: 0.5 10*3/uL (ref 0.1–1.0)
Monocytes Relative: 4 %
Neutro Abs: 7.6 10*3/uL (ref 1.7–7.7)
Neutrophils Relative %: 71 %
Platelets: 259 10*3/uL (ref 150–400)
RBC: 5.36 MIL/uL (ref 4.22–5.81)
RDW: 15.9 % — ABNORMAL HIGH (ref 11.5–15.5)
WBC: 10.7 10*3/uL — ABNORMAL HIGH (ref 4.0–10.5)
nRBC: 0 % (ref 0.0–0.2)

## 2019-05-15 LAB — BASIC METABOLIC PANEL
Anion gap: 11 (ref 5–15)
BUN: 15 mg/dL (ref 6–20)
CO2: 22 mmol/L (ref 22–32)
Calcium: 9.5 mg/dL (ref 8.9–10.3)
Chloride: 101 mmol/L (ref 98–111)
Creatinine, Ser: 1.22 mg/dL (ref 0.61–1.24)
GFR calc Af Amer: >60 mL/min (ref >60)
GFR calc non Af Amer: >60 mL/min (ref >60)
Glucose, Bld: 380 mg/dL — ABNORMAL HIGH (ref 70–99)
Potassium: 3.7 mmol/L (ref 3.5–5.1)
Sodium: 134 mmol/L — ABNORMAL LOW (ref 135–145)

## 2019-05-15 LAB — CBG MONITORING, ED: Glucose-Capillary: 256 mg/dL — ABNORMAL HIGH (ref 70–99)

## 2019-05-15 MED ORDER — KETOROLAC TROMETHAMINE 30 MG/ML IJ SOLN
15.0000 mg | Freq: Once | INTRAMUSCULAR | Status: AC
  Administered 2019-05-15: 15 mg via INTRAVENOUS
  Filled 2019-05-15: qty 1

## 2019-05-15 MED ORDER — ONDANSETRON HCL 4 MG PO TABS: 4.0000 mg | ORAL_TABLET | Freq: Three times a day (TID) | ORAL | 0 refills | Status: DC | PRN

## 2019-05-15 MED ORDER — OXYCODONE-ACETAMINOPHEN 5-325 MG PO TABS: 1.0000 | ORAL_TABLET | ORAL | 0 refills | Status: DC | PRN

## 2019-05-15 MED ORDER — INSULIN ASPART 100 UNIT/ML ~~LOC~~ SOLN
10.0000 [IU] | Freq: Once | SUBCUTANEOUS | Status: AC
  Administered 2019-05-15: 10 [IU] via SUBCUTANEOUS
  Filled 2019-05-15: qty 1

## 2019-05-15 MED ORDER — ONDANSETRON HCL 4 MG/2ML IJ SOLN
4.0000 mg | Freq: Once | INTRAMUSCULAR | Status: AC
  Administered 2019-05-15: 4 mg via INTRAVENOUS
  Filled 2019-05-15: qty 2

## 2019-05-15 MED ORDER — MORPHINE SULFATE (PF) 4 MG/ML IV SOLN
4.0000 mg | Freq: Once | INTRAVENOUS | Status: AC
  Administered 2019-05-15: 4 mg via INTRAVENOUS
  Filled 2019-05-15: qty 1

## 2019-05-15 MED ORDER — SODIUM CHLORIDE 0.9 % IV BOLUS
1000.0000 mL | Freq: Once | INTRAVENOUS | Status: AC
  Administered 2019-05-15: 1000 mL via INTRAVENOUS

## 2019-05-15 NOTE — ED Notes (Signed)
Patient transported to CT 

## 2019-05-15 NOTE — Discharge Instructions (Signed)
Take the pain medication given if needed for persistent pain symptoms.  It is important that you get checked immediately if you have uncontrolled pain or if you start developing uncontrolled vomiting or fevers.  You have also been prescribed a nausea medication to help you with this symptom if it occurs.  Call Dr. Ronne Binning for further management of this kidney stone.  Strain your urine so that you will know if this kidney stone has passed.

## 2019-05-15 NOTE — ED Triage Notes (Signed)
Patient complains of right side testicle and groin pain that began 2 days ago. Patient endorses N/V x2 today.

## 2019-05-15 NOTE — ED Notes (Signed)
Pt currently getting ultrasound done

## 2019-05-15 NOTE — ED Notes (Signed)
Bladder scan showed >200ml

## 2019-05-16 MED FILL — Oxycodone w/ Acetaminophen Tab 5-325 MG: ORAL | Qty: 6 | Status: AC

## 2019-05-16 NOTE — ED Provider Notes (Signed)
[facility] EMERGENCY DEPARTMENT Provider Note   CSN: [id number] Arrival date & time: 05/15/19  1547     History Chief Complaint  Patient presents with  . Testicle Pain    Devin [name] is a 49 y.o. male with a history significant for CAD, GERD, history of kidney stones, hypertension and type 2 diabetes presenting with a 2-day history of right sided scrotal pain which has been intermittent but worsened today.  His symptoms initially started with some mild right lower back pain which is not currently present.  Additionally he has developed nausea with vomiting x2 today.  He has had no fevers or chills and denies abdominal pain or distention.  He has had reduced urinary output, stating he has to urinate very frequently but is only passing small quantities of urine.  He has had no dysuria or hematuria.  His symptoms do remind him of prior kidney stone problems.  He required lithotripsy several years ago.  He has had no medications and has found no alleviators for his symptoms.  The history is provided by the patient.       Past Medical History:  Diagnosis Date  . Asthma   . Chest pain   . CHF (congestive heart failure) ([place])   . Coronary artery disease    a. s/p DES x2 to LAD and DES to OM in 11/2017  . Depression   . Dyspnea   . GERD (gastroesophageal reflux disease)   . Headache   . History of kidney stones   . History of rhabdomyolysis   . Hyperlipidemia   . Hypertension   . Hypothyroidism   . Ischemic cardiomyopathy    a. 11/2017: echo showing EF of 35-40%, diffuse HK, and Grade 2 DD  . Kidney calculus 2014  . Pericardial effusion    Small, by dobutamine echocardiogram, 04/2006  . PONV (postoperative nausea and vomiting)   . Sleep apnea    does not wear cpap  . Stroke ([place])   . Tobacco abuse   . Type 2 diabetes mellitus without complications ([place]) 4/88/8916  . Vitamin D deficiency     Patient Active Problem List   Diagnosis Date Noted  . Ischemic cardiomyopathy  10/02/2018  . 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 Acute cerebrovascular accident (CVA) ([place]) 02/10/2018  . Chronic combined systolic and diastolic CHF (congestive heart failure) ([place]) 01/06/2018  . Altered mental status 01/05/2018  . History of stroke 01/05/2018  . 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 Cerebellar stroke ([place])   . TIA (transient ischemic attack) 11/30/2017  . Abnormal cardiac CT angiography   . Coronary artery disease   . Chest pain due to CAD ([place]) 11/14/2017  . Chest pain with moderate risk for cardiac etiology   . CKD (chronic kidney disease) 11/13/2017  . Hypothyroidism 10/07/2013  . Type 2 diabetes mellitus without complications ([place]) 94/50/3888  . Acute renal failure ([place]) 10/06/2013  . Elevated AST (SGOT) 10/06/2013  . Elevated ALT measurement 10/06/2013  . Chest pain   . Tobacco abuse   . Hypertension   . Hyperlipidemia   . Pericardial effusion   . History of rhabdomyolysis     Past Surgical History:  Procedure Laterality Date  . APPENDECTOMY    . 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 CHOLECYSTECTOMY N/A 01/27/2013   Procedure: LAPAROSCOPIC CHOLECYSTECTOMY;  Surgeon: Jeannette How  Arnoldo Morale, MD;  Location: [facility];  Service: General;  Laterality: N/A;  . CORONARY STENT INTERVENTION N/A 11/15/2017   Procedure: CORONARY STENT INTERVENTION;  Surgeon: Leonie Man, MD;  Location: [place] [facility];  Service: Cardiovascular;  Laterality: N/A;  . HERNIA REPAIR     As a child  . INSERTION OF MESH N/A 11/13/2012   Procedure: INSERTION OF MESH;  Surgeon: Jamesetta So, MD;  Location: [facility];  Service: General;  Laterality: N/A;  . IR ANGIO INTRA EXTRACRAN SEL COM CAROTID INNOMINATE BILAT MOD SED  12/01/2017  . IR ANGIO INTRA EXTRACRAN SEL COM CAROTID INNOMINATE BILAT MOD SED  02/13/2018  . IR ANGIO  VERTEBRAL SEL SUBCLAVIAN INNOMINATE UNI L MOD SED  02/13/2018  . IR ANGIO VERTEBRAL SEL VERTEBRAL BILAT MOD SED  12/01/2017  . IR ANGIO VERTEBRAL SEL VERTEBRAL UNI R MOD SED  02/13/2018  . IR INTRA CRAN STENT  12/03/2017  . IR PTA INTRACRANIAL  02/25/2018  . IR US GUIDE VASC ACCESS RIGHT  02/13/2018  . KIDNEY STONE SURGERY    . LEFT HEART CATH AND CORONARY ANGIOGRAPHY N/A 11/15/2017   Procedure: LEFT HEART CATH AND CORONARY ANGIOGRAPHY;  Surgeon: Leonie Man, MD;  Location: [place] [facility];  Service: Cardiovascular;  Laterality: N/A;  . PERCUTANEOUS NEPHROLITHOTRIPSY    . RADIOLOGY WITH ANESTHESIA Left 12/03/2017   Procedure: Angioplasty with possible stenting of left VBJ;  Surgeon: Luanne Bras, MD;  Location: [place];  Service: Radiology;  Laterality: Left;  . RADIOLOGY WITH ANESTHESIA N/A 02/25/2018   Procedure: STENT PLACEMENT;  Surgeon: Luanne Bras, MD;  Location: [place];  Service: Radiology;  Laterality: N/A;  . UMBILICAL HERNIA REPAIR N/A 11/13/2012   Procedure: UMBILICAL HERNIORRHAPHY;  Surgeon: Jamesetta So, MD;  Location: [facility];  Service: General;  Laterality: N/A;  . VARICOCELECTOMY         Family History  Problem Relation Age of Onset  . Coronary artery disease Father   . Hypertension Father   . Hyperlipidemia Father   . Diabetes Father   . Congestive Heart Failure Father 82  . Arrhythmia Father        had an ICD  . Diabetes Mother   . Hypertension Mother   . Hyperlipidemia Mother   . Obesity Mother 20       died after bariatric surgery, liver failure and infection  . Coronary artery disease Maternal Grandfather        both grandfathers and several uncles  . Coronary artery disease Other   . Diabetes Sister        both sisters  . Stroke Maternal Aunt   . Stroke Maternal Uncle     Social History   Tobacco Use  . Smoking status: Current Every Day Smoker    Packs/day: 0.50    Years: 26.00    Pack years: 13.00    Types: Cigarettes    Start date:  03/13/1985  . Smokeless tobacco: Former User    Types: Snuff, Sarina Ser    Quit date: 03/14/1987  Substance Use Topics  . Alcohol use: No  . Drug use: No    Home Medications Prior to Admission medications   Medication Sig Start Date End Date Taking? Authorizing Provider  amLODipine (NORVASC) 5 MG tablet Take 1 tablet (5 mg total) by mouth daily. 10/07/18 02/11/19  Imogene Burn, PA-C  amoxicillin-clavulanate (AUGMENTIN) 875-125 MG tablet Take 1 tablet by mouth every 12 (twelve) hours. 01/23/19   Janette Harvie,  Ginette Otto  aspirin 81 MG EC tablet Take 1 tablet (81 mg total) by mouth daily. With food 12/05/17   Roxan Hockey, MD  benzonatate (TESSALON) 100 MG capsule Take 2 capsules (200 mg total) by mouth 3 (three) times daily as needed. 01/23/19   Evalee Jefferson, PA-C  Blood Glucose Monitoring Suppl w/Device KIT Dispense based on patient and insurance preference. Check blood sugar daily before breakfast (ICD9 250.0) 04/29/17   Pollina, Gwenyth Allegra, MD  clopidogrel (PLAVIX) 75 MG tablet Take 75 mg by mouth daily.    [provider]  EUTHYROX 200 MCG tablet Take 200 mcg by mouth daily. 07/20/18   [provider]  EUTHYROX 50 MCG tablet Take 50 mcg by mouth daily. 07/20/18   [provider]  ezetimibe (ZETIA) 10 MG tablet Take 1 tablet (10 mg total) by mouth daily. 12/06/17   Roxan Hockey, MD  famotidine (PEPCID) 20 MG tablet Take 20 mg by mouth 2 (two) times daily.    [provider]  fenofibrate (TRICOR) 145 MG tablet Take 145 mg by mouth daily.    [provider]  glimepiride (AMARYL) 2 MG tablet Take 1 tablet (2 mg total) by mouth daily with breakfast. 11/16/17   Patrecia Pour, MD  nitroGLYCERIN (NITROSTAT) 0.4 MG SL tablet Place 1 tablet (0.4 mg total) under the tongue every 5 (five) minutes as needed for chest pain. 01/15/18   Strader, Fransisco Hertz, PA-C  ondansetron (ZOFRAN) 4 MG tablet Take 1 tablet (4 mg total) by mouth every 8 (eight) hours as needed for nausea  or vomiting. 05/15/19   Evalee Jefferson, PA-C  oxyCODONE-acetaminophen (PERCOCET/ROXICET) 5-325 MG tablet Take 1 tablet by mouth every 4 (four) hours as needed for severe pain. 05/15/19   Evalee Jefferson, PA-C  oxyCODONE-acetaminophen (PERCOCET/ROXICET) 5-325 MG tablet Take 1 tablet by mouth every 4 (four) hours as needed. 05/15/19   Evalee Jefferson, PA-C  sitaGLIPtin (JANUVIA) 100 MG tablet Take 100 mg by mouth daily.    [provider]  tamsulosin (FLOMAX) 0.4 MG CAPS capsule Take 1 capsule (0.4 mg total) by mouth daily. Take 1 daily until you pass the kidney stone 01/11/19   Pollina, Gwenyth Allegra, MD  tiotropium (SPIRIVA HANDIHALER) 18 MCG inhalation capsule Place 1 capsule (18 mcg total) into inhaler and inhale every morning. 03/21/19   Arnoldo Lenis, MD    Allergies    Crestor [rosuvastatin], Trulicity [dulaglutide], and Bee venom  Review of Systems   Review of Systems  Constitutional: Negative for fever.  HENT: Negative for congestion and sore throat.   Eyes: Negative.   Respiratory: Negative for chest tightness and shortness of breath.   Cardiovascular: Negative for chest pain.  Gastrointestinal: Positive for nausea and vomiting. Negative for abdominal distention and abdominal pain.  Genitourinary: Positive for decreased urine volume, flank pain and testicular pain. Negative for dysuria, penile pain and scrotal swelling.  Musculoskeletal: Negative for arthralgias, joint swelling and neck pain.  Skin: Negative.  Negative for rash and wound.  Neurological: Negative for dizziness, weakness, light-headedness, numbness and headaches.  Psychiatric/Behavioral: Negative.     Physical Exam Updated Vital Signs BP 112/69   Pulse (!) 54   Temp 98.4 F (36.9 C) (Oral)   Resp 17   Ht 6' 6"  (1.981 m)   Wt 136.1 kg   SpO2 96%   BMI 34.67 kg/m   Physical Exam Vitals and nursing note reviewed. Exam conducted with a chaperone present.  Constitutional:  Appearance: He is well-developed.       Comments: Appears uncomfortable.  HENT:     Head: Normocephalic and atraumatic.  Eyes:     Conjunctiva/sclera: Conjunctivae normal.  Cardiovascular:     Rate and Rhythm: Normal rate and regular rhythm.     Heart sounds: Normal heart sounds.  Pulmonary:     Effort: Pulmonary effort is normal.     Breath sounds: Normal breath sounds. No wheezing.  Abdominal:     General: Bowel sounds are normal. There is no distension.     Palpations: Abdomen is soft.     Tenderness: There is no abdominal tenderness. There is no right CVA tenderness, left CVA tenderness, guarding or rebound.  Genitourinary:    Testes: Cremasteric reflex is present.        Right: Tenderness present. Swelling not present.        Left: Tenderness or swelling not present.     Epididymis:     Right: No tenderness.     Left: No tenderness.     Comments: Mild tenderness to palpation of the right testicle without edema, no appreciable hernia.   Musculoskeletal:        General: Normal range of motion.     Cervical back: Normal range of motion.  Skin:    General: Skin is warm and dry.  Neurological:     Mental Status: He is alert.     ED Results / Procedures / Treatments   Labs (all labs ordered are listed, but only abnormal results are displayed) Labs Reviewed  URINALYSIS, ROUTINE W REFLEX MICROSCOPIC - Abnormal; Notable for the following components:      Result Value   Glucose, UA >=500 (*)    Hgb urine dipstick MODERATE (*)    Protein, ur 30 (*)    Bacteria, UA RARE (*)    All other components within normal limits  CBC WITH DIFFERENTIAL/PLATELET - Abnormal; Notable for the following components:   WBC 10.7 (*)    RDW 15.9 (*)    All other components within normal limits  BASIC METABOLIC PANEL - Abnormal; Notable for the following components:   Sodium 134 (*)    Glucose, Bld 380 (*)    All other components within normal limits  CBG MONITORING, ED - Abnormal; Notable for the following components:    Glucose-Capillary 256 (*)    All other components within normal limits    EKG None  Radiology US Renal  Result Date: 05/15/2019 CLINICAL DATA:  Right testicular pain.  No history of kidney stones. EXAM: RENAL / URINARY TRACT ULTRASOUND COMPLETE COMPARISON:  None. FINDINGS: Right Kidney: Renal measurements: 12 x 7 x 7 = volume: 287 mL . Echogenicity within normal limits. Moderate hydronephrosis. Nonobstructing micro nephrolithiasis. No suspicious renal mass. Left Kidney: Renal measurements: 13 x 7 x 7 = volume: 375 mL. Echogenicity within normal limits. No mass or hydronephrosis. A nonobstructing 1 mm renal calcification in the lower pole. Bladder: A 258 mL prevoid urinary bladder volume. Neither the right nor left urinary jet is imaged. No apparent bladder calculus, mass or wall thickening. Other: Marked fatty infiltration of the hepatic parenchyma. IMPRESSION: Moderate right hydronephrosis. Neither the right nor left urinary jet is imaged, an obstructing right ureteral calculus could be present. Nonobstructing bilateral micro nephrolithiasis. Marked fatty infiltration of the liver. Electronically Signed   By: Revonda Humphrey   On: 05/15/2019 18:25   CT Renal Stone Study  Result Date: 05/15/2019 CLINICAL DATA:  Right flank  pain.  History of kidney stones. EXAM: CT ABDOMEN AND PELVIS WITHOUT CONTRAST TECHNIQUE: Multidetector CT imaging of the abdomen and pelvis was performed following the standard protocol without IV contrast. COMPARISON:  01/11/2019 FINDINGS: Lower chest: Extensive coronary artery calcification, markedly premature for age. Scarring at the lung bases right more than left. Hepatobiliary: Previous cholecystectomy. No lesion of the liver parenchyma identified. Pancreas: Normal Spleen: Normal Adrenals/Urinary Tract: Adrenal glands are normal. Left kidney is normal except for a few tiny nonobstructing stones. Right kidney contains 4 or 5 small nonobstructing stones. There is mild  hydroureteronephrosis. There is a 4-5 mm stone in the distal right ureter 1-2 cm proximal to the UVJ. Stomach/Bowel: No acute bowel finding.  Appendix is normal. Vascular/Lymphatic: Aortic atherosclerosis. No aneurysm. IVC is normal. No adenopathy. Reproductive: Normal Other: No free fluid or air. Bilateral inguinal hernias containing only fat. Musculoskeletal: No significant bone finding. IMPRESSION: 4-5 mm stone in the distal right ureter 1-2 cm proximal to the UVJ. Mild right hydroureteronephrosis. Several other nonobstructing calculi in the kidneys, right more than left. Advanced coronary artery calcification for age. Scarring at the lung bases. Previous cholecystectomy. Aortic atherosclerosis. Electronically Signed   By: Nelson Chimes M.D.   On: 05/15/2019 20:35   US SCROTUM W/DOPPLER  Result Date: 05/15/2019 CLINICAL DATA:  49 year old male with right testicular pain x3 days. EXAM: SCROTAL ULTRASOUND DOPPLER ULTRASOUND OF THE TESTICLES TECHNIQUE: Complete ultrasound examination of the testicles, epididymis, and other scrotal structures was performed. Color and spectral Doppler ultrasound were also utilized to evaluate blood flow to the testicles. COMPARISON:  None. FINDINGS: Right testicle Measurements: 4.5 x 2.6 x 3.1 cm. No mass or microlithiasis visualized. Left testicle Measurements: 5.0 x 3.1 x 3.4 cm. No mass or microlithiasis visualized. Right epididymis: Normal in size and appearance. There is a 5 mm right epididymal head cyst. Left epididymis: Normal in size and appearance. There is a 5 mm left epididymal head cyst. Hydrocele:  Small minimally complex left hydrocele. Varicocele:  None visualized. Pulsed Doppler interrogation of both testes demonstrates normal low resistance arterial and venous waveforms bilaterally. IMPRESSION: 1. Unremarkable testicles. 2. Small left hydrocele. Electronically Signed   By: Anner Crete M.D.   On: 05/15/2019 18:27    Procedures Procedures (including critical  care time)  Medications Ordered in ED Medications  morphine 4 MG/ML injection 4 mg (4 mg Intravenous Given 05/15/19 1717)  ondansetron (ZOFRAN) injection 4 mg (4 mg Intravenous Given 05/15/19 1717)  sodium chloride 0.9 % bolus 1,000 mL (0 mLs Intravenous Stopped 05/15/19 2301)  insulin aspart (novoLOG) injection 10 Units (10 Units Subcutaneous Given 05/15/19 2143)  ketorolac (TORADOL) 30 MG/ML injection 15 mg (15 mg Intravenous Given 05/15/19 2325)    ED Course  I have reviewed the triage vital signs and the nursing notes.  Pertinent labs & imaging results that were available during my care of the patient were reviewed by me and considered in my medical decision making (see chart for details).    MDM Rules/Calculators/A&P                      Patient's labs and imaging were reviewed and discussed with patient.  He was given IV fluids along with IV pain medication and had improvement in his pain symptoms.  He was given a urine strainer, instructed in home treatment including increase fluid intake, patient was prescribed oxycodone and Zofran for symptom relief.  Strict return precautions including worsened pain, uncontrolled nausea and vomiting or  development of fever.  He was advised to strain his urine so he will know if this stone passes.  Referral back to Dr. Alyson Ingles for further management of this kidney stone.  Patient did have significant hyperglycemia while here without an anion gap.  He was given subcu insulin aspart and had significant improvement in his glucose level prior to discharge home. Final Clinical Impression(s) / ED Diagnoses Final diagnoses:  Right ureteral stone  Hyperglycemia    Rx / DC Orders ED Discharge Orders         Ordered    oxyCODONE-acetaminophen (PERCOCET/ROXICET) 5-325 MG tablet  Every 4 hours PRN     05/15/19 2258    oxyCODONE-acetaminophen (PERCOCET/ROXICET) 5-325 MG tablet  Every 4 hours PRN     05/15/19 2258    ondansetron (ZOFRAN) 4 MG tablet  Every 8  hours PRN     05/15/19 2301           Evalee Jefferson, PA-C 05/16/19 0039    Wyvonnia Dusky, MD 05/16/19 [phone]

## 2019-05-19 ENCOUNTER — Telehealth (INDEPENDENT_AMBULATORY_CARE_PROVIDER_SITE_OTHER): Payer: Self-pay | Admitting: Cardiology

## 2019-05-19 ENCOUNTER — Encounter: Payer: Self-pay | Admitting: Cardiology

## 2019-05-19 VITALS — Ht 78.0 in | Wt 300.0 lb

## 2019-05-19 DIAGNOSIS — E782 Mixed hyperlipidemia: Secondary | ICD-10-CM

## 2019-05-19 DIAGNOSIS — R0602 Shortness of breath: Secondary | ICD-10-CM

## 2019-05-19 DIAGNOSIS — I2510 Atherosclerotic heart disease of native coronary artery without angina pectoris: Secondary | ICD-10-CM

## 2019-05-19 NOTE — Patient Instructions (Signed)
Your physician recommends that you schedule a follow-up appointment in: 2 MONTHS WITH DR BRANCH  Your physician recommends that you continue on your current medications as directed. Please refer to the Current Medication list given to you today.  Thank you for choosing [facility]!!    

## 2019-05-19 NOTE — Progress Notes (Signed)
Virtual Visit via Telephone Note   This visit type was conducted due to national recommendations for restrictions regarding the COVID-19 Pandemic (e.g. social distancing) in an effort to limit this patient's exposure and mitigate transmission in our community.  Due to his co-morbid illnesses, this patient is at least at moderate risk for complications without adequate follow up.  This format is felt to be most appropriate for this patient at this time.  The patient did not have access to video technology/had technical difficulties with video requiring transitioning to audio format only (telephone).  All issues noted in this document were discussed and addressed.  No physical exam could be performed with this format.  Please refer to the patient's chart for his  consent to telehealth for [facility].   The patient was identified using 2 identifiers.  Date:  05/19/2019   ID:  Devin [name], DOB 03-09-1971, MRN [id number]  Patient Location: Home Provider Location: Office  PCP:  [facility]  Cardiologist:  Dina Rich, MD  Electrophysiologist:  None   Evaluation Performed:  Follow-Up Visit  Chief Complaint:  Follow up  History of Present Illness:    Devin [name] is a 49 y.o. male seen today for a focused follow up for the following medical problems.    1. CAD/Chest pain/Chronic systolic HF - admitted 11/2017 with chest pain concerning for unstable angina.  11/2017 coronary CTA with severe prox LAD disease and possible mid LAD disease and D1.  11/2017 cath LM patent, LAD mid 80 to 90%, LCX distal 90%, OM2 90%, LPDA 90%, RCA small with prox 99% LVgram 35-45% (no echo on file).DES x 2to LAD, DES to OM, trifurcation lesion distal LCX, LPL and PDA recs for medical therapy, poor pci target. RCA small vessel treated medically.  Interventional cards recommended extended DAPT  01/2018 echo: LVEF 35-40%, grade I diastoluc dysfunction - bradycardia on  beta blocketers. History of rhabdo on crestor.Low bp and dizzienss on entresto.    10/2018 nuclear stress: no clear ischemia. LVEF 46% - no recent chest pain. Chronic SOB unchanged, he was not able to start spiriva due to cost.  - compliant with meds    2. COPD - abnormal PFTs 02/2019 - not able to get inhaler due to cost.  - is to f/u with pcp at health dept to see if can get assistance.   2. Hyperlipidemia - history of rhabdo on crestor  02/2018 TC 211 TG 391 LDL 109 - on zetia. Fenofibrate, had TGs in 700s during recent admission. Note recent severe hypothyroidism and poorly contrlled DM2 likely playing a role.  02/2019 TC 480 HDL 32 LDL TG 1952 not calculated - he did not go for the repeat labs we ordered  3. Hypothyroidism - TSH previously above 100, followed by pcp    4. History of CVA - history of CVA 11/2017 - hadunderwent an image-guided diagnostic cerebral angiogram 12/01/2017 with Dr. 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 He then underwent an image-guided cerebral angiogram with revascularization of his vertebrobasilar junction/proximal basilar artery stenosis using stent assisted angioplasty 12/03/2017 by Dr. Corliss Skains - later had ISR requiring repeat procedure 02/2018   The patient does not have symptoms concerning for COVID-19 infection (fever, chills, cough, or new shortness of breath).    Past Medical History:  Diagnosis Date  . Asthma   . Chest pain   . CHF (congestive heart failure) (HCC)   . Coronary artery disease  a. s/p DES x2 to LAD and DES to OM in 11/2017  . Depression   . Dyspnea   . GERD (gastroesophageal reflux disease)   . Headache   . History of kidney stones   . History of rhabdomyolysis   . Hyperlipidemia   . Hypertension   . Hypothyroidism   . Ischemic cardiomyopathy    a. 11/2017: echo showing EF of 35-40%, diffuse HK, and Grade 2 DD  . Kidney  calculus 2014  . Pericardial effusion    Small, by dobutamine echocardiogram, 04/2006  . PONV (postoperative nausea and vomiting)   . Sleep apnea    does not wear cpap  . Stroke ([place])   . Tobacco abuse   . Type 2 diabetes mellitus without complications ([place]) 2/72/5366  . Vitamin D deficiency    Past Surgical History:  Procedure Laterality Date  . APPENDECTOMY    . 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 CHOLECYSTECTOMY N/A 01/27/2013   Procedure: LAPAROSCOPIC CHOLECYSTECTOMY;  Surgeon: Jamesetta So, MD;  Location: [facility];  Service: General;  Laterality: N/A;  . CORONARY STENT INTERVENTION N/A 11/15/2017   Procedure: CORONARY STENT INTERVENTION;  Surgeon: Leonie Man, MD;  Location: [place] [facility];  Service: Cardiovascular;  Laterality: N/A;  . HERNIA REPAIR     As a child  . INSERTION OF MESH N/A 11/13/2012   Procedure: INSERTION OF MESH;  Surgeon: Jamesetta So, MD;  Location: [facility];  Service: General;  Laterality: N/A;  . IR ANGIO INTRA EXTRACRAN SEL COM CAROTID INNOMINATE BILAT MOD SED  12/01/2017  . IR ANGIO INTRA EXTRACRAN SEL COM CAROTID INNOMINATE BILAT MOD SED  02/13/2018  . IR ANGIO VERTEBRAL SEL SUBCLAVIAN INNOMINATE UNI L MOD SED  02/13/2018  . IR ANGIO VERTEBRAL SEL VERTEBRAL BILAT MOD SED  12/01/2017  . IR ANGIO VERTEBRAL SEL VERTEBRAL UNI R MOD SED  02/13/2018  . IR INTRA CRAN STENT  12/03/2017  . IR PTA INTRACRANIAL  02/25/2018  . IR US GUIDE VASC ACCESS RIGHT  02/13/2018  . KIDNEY STONE SURGERY    . LEFT HEART CATH AND CORONARY ANGIOGRAPHY N/A 11/15/2017   Procedure: LEFT HEART CATH AND CORONARY ANGIOGRAPHY;  Surgeon: Leonie Man, MD;  Location: [place] [facility];  Service: Cardiovascular;  Laterality: N/A;  . PERCUTANEOUS NEPHROLITHOTRIPSY    . RADIOLOGY WITH ANESTHESIA Left 12/03/2017   Procedure: Angioplasty with possible stenting of left VBJ;  Surgeon: Luanne Bras, MD;  Location: [place];  Service: Radiology;  Laterality: Left;  . RADIOLOGY WITH ANESTHESIA N/A 02/25/2018   Procedure: STENT PLACEMENT;  Surgeon: Luanne Bras, MD;  Location: [place];  Service: Radiology;  Laterality: N/A;  . UMBILICAL HERNIA REPAIR N/A 11/13/2012   Procedure: UMBILICAL HERNIORRHAPHY;  Surgeon: Jamesetta So, MD;  Location: [facility];  Service: General;  Laterality: N/A;  . VARICOCELECTOMY       No outpatient medications have been marked as taking for the 05/19/19 encounter (Appointment) with Arnoldo Lenis, MD.     Allergies:   Crestor [rosuvastatin], Trulicity [dulaglutide], and Bee venom   Social History   Tobacco Use  . Smoking status: Current Every Day Smoker    Packs/day: 0.50    Years: 26.00    Pack years: 13.00    Types: Cigarettes    Start date: 03/13/1985  . Smokeless tobacco: Former Systems developer    Types: Snuff, Loss adjuster, chartered  Quit date: 03/14/1987  Substance Use Topics  . Alcohol use: No  . Drug use: No     Family Hx: The patient's family history includes Arrhythmia in his father; Congestive Heart Failure (age of onset: 31) in his father; Coronary artery disease in his father, maternal grandfather, and another family member; Diabetes in his father, mother, and sister; Hyperlipidemia in his father and mother; Hypertension in his father and mother; Obesity (age of onset: 2) in his mother; Stroke in his maternal aunt and maternal uncle.  ROS:   Please see the history of present illness.     All other systems reviewed and are negative.   Prior CV studies:   The following studies were reviewed today:  11/2017 cath  There is moderate left ventricular systolic dysfunction. The left ventricular ejection fraction is 35-45% by visual estimate.  LV end diastolic pressure is mildly elevated.  -----------------------------------------  ANGIOGRAPHY- PCI  LESION #1: Mid LAD lesion is 80% stenosed.  A drug-eluting stent was successfully placed using a STENT  SYNERGY DES 2.25X16. -Postdilated to 2.4 mm  Post intervention, there is a 0% residual stenosis.  LESION #2: mid LAD to Dist LAD lesion is 90% stenosed.  A drug-eluting stent was successfully placed using a STENT SYNERGY DES 2.25X12. -Postdilated to 2.4 mm  Post intervention, there is a 0% residual stenosis.  Prox LAD lesion is 40% stenosed -prior to lesion 1; apical, after lesion 2  A drug-eluting stent was successfully placed using a STENT SYNERGY DES 2.25X12. -Postdilated to 2.4 mm  -----------------------------------------  Post intervention, there is a 0% residual stenosis.  Trifurcation 90% lesion at the distal circumflex into LPL and PDA -small caliber distal vessels. Best treated medically as this is not a very good PCI target  Prox small caliber nondominant RCA lesion is 99% stenosed.  Severe distal OM 2 -DES PCI with Synergy 2.25 mm x16 mm (2.4 mm); severe trifurcation disease with a very distal AV groove circumflex prior to PDA and PL Joannie Medine -medical management.  Subtotally occluded nondominant RCA Moderately reduced LVEF of roughly 40% with mildly elevated LVEDP.  Plan: Transfer to postprocedure unit for post PCI monitoring and TR removal Continue aggressive risk factor modification with statin and beta-blocker etc.  Recommend uninterrupted dual antiplatelet therapy with Aspirin 81mg  [name] and Ticagrelor 90mg  twice dailyfor a minimum of 12 months (ACS - Class I recommendation).-Would strongly consider reducing to 60 mg dose ticagrelor at 9-12 months and continue for an additional year.  Would be okay to stop aspirin after 3 months if necessary. Okay to stop Brilinta after 6 months for procedures if necessary.   10/2018 nuclear stress  There was no ST segment deviation noted during stress.  Defect 1: There is a small defect of mild severity present in the mid inferoseptal location. This is likely due to soft tissue attenuation artifact.  This is a low risk  study. No ischemic zones.  Nuclear stress EF: 46  Labs/Other Tests and Data Reviewed:    EKG:  No ECG reviewed.  Recent Labs: 07/27/2018: Magnesium 1.7 07/29/2018: ALT 40 05/15/2019: BUN 15; Creatinine, Ser 1.22; Hemoglobin 15.6; Platelets 259; Potassium 3.7; Sodium 134   Recent Lipid Panel Lab Results  Component Value Date/Time   CHOL 211 (H) 02/11/2018 06:15 AM   TRIG 391 (H) 02/11/2018 06:15 AM   HDL 25 (L) 02/11/2018 06:15 AM   CHOLHDL 8.4 02/11/2018 06:15 AM   LDLCALC 108 (H) 02/11/2018 06:15 AM    Wt Readings from Last  3 Encounters:  05/15/19 300 lb (136.1 kg)  02/19/19 276 lb (125.2 kg)  02/11/19 280 lb (127 kg)     Objective:    Vital Signs:  Today's Vitals   05/19/19 1135  Weight: 300 lb (136.1 kg)  Height: 6\' 6"  (1.981 m)   Body mass index is 34.67 kg/m. Normal affect. Normal speech pattern and tone. Comfortable, no apparent distress. No audible signs of SOB or wheezing.   ASSESSMENT & PLAN:    1. CAD/ICM/SOB -cardiac wise doing well, continue current meds  2. COPD - abnormal PFTs 02/2019, I believe this is the cause of his ongoing SOB - he is to discuss with pcp at health dept if any assistance avaialble to obtain inhalers  3. Hyperlipidemia - marked elevated TGs in setting of severe hypothyroidism and poorly controlled DM2 - I strongly encouraged him to f/u with pcp to reassess status of both these issues, and also have his labs repeated there - continue fenofibrate, zetia. He is intolerant to statins     COVID-19 Education: The signs and symptoms of COVID-19 were discussed with the patient and how to seek care for testing (follow up with PCP or arrange E-visit).  The importance of social distancing was discussed today.  Time:   Today, I have spent 22 minutes with the patient with telehealth technology discussing the above problems.     Medication Adjustments/Labs and Tests Ordered: Current medicines are reviewed at length with the patient  today.  Concerns regarding medicines are outlined above.   Tests Ordered: No orders of the defined types were placed in this encounter.   Medication Changes: No orders of the defined types were placed in this encounter.   Follow Up:  Either In Person or Virtual in 2 month(s)   Signed, 03/2019, MD  05/19/2019 10:18 AM    [facility]

## 2019-06-12 ENCOUNTER — Telehealth: Payer: Self-pay

## 2019-06-12 NOTE — Telephone Encounter (Signed)
-----   Message from Antoine Poche, MD sent at 06/12/2019  9:48 AM EDT ----- Very high triglycerides due to poorly controlled diabetes and hypothryodism. He is already on medication for the triglycerides, the root of the solution is to get his diabetes and thyroid controlled which will dramatically improve his triglycerides. Needs close f/u with pcp. Needs to avoid fatty/fried foods, sweets, beers/licquors  Dominga Ferry MD

## 2019-06-12 NOTE — Telephone Encounter (Signed)
Pt made aware, copy to pcp.  

## 2019-07-25 ENCOUNTER — Ambulatory Visit: Payer: Self-pay | Admitting: Cardiology

## 2019-07-25 NOTE — Progress Notes (Deleted)
Clinical Summary Devin [name] is a 49 y.o.male  1. CAD/Chest pain/Chronic systolic HF - admitted 05/2200 with chest pain concerning for unstable angina.  11/2017 coronary CTA with severe prox LAD disease and possible mid LAD disease and D1.  11/2017 cath LM patent, LAD mid 80 to 90%, LCX distal 90%, OM2 90%, LPDA 90%, RCA small with prox 99% LVgram 35-45% (no echo on file).DES x 2to LAD, DES to OM, trifurcation lesion distal LCX, LPL and PDA recs for medical therapy, poor pci target. RCA small vessel treated medically.  Interventional cards recommended extended DAPT  01/2018 echo: LVEF 35-40%, grade I diastoluc dysfunction - bradycardia on beta blocketers. History of rhabdo on crestor.Low bp and dizzienss on entresto.    10/2018 nuclear stress: no clear ischemia. LVEF 46% - no recent chest pain. Chronic SOB unchanged, he was not able to start spiriva due to cost.  - compliant with meds    2. COPD - abnormal PFTs 02/2019 - not able to get inhaler due to cost.  - is to f[facility]u with pcp at [facility] dept to see if can get assistance.   2. Hyperlipidemia - history of rhabdo on crestor  02/2018 TC 211 TG 391 LDL 109 - on zetia. Fenofibrate, had TGs in 700s during recent admission. Note recent severe hypothyroidism and poorly contrlled DM2 likely playing a role.  02/2019 TC 480 HDL 32 LDL TG 1952 not calculated - he did not go for the repeat labs we ordered  3. Hypothyroidism - TSH previously above 100, followed by pcp    4. History of CVA - history of CVA 11/2017 - hadunderwent an image-guided diagnostic cerebral angiogram 12/01/2017 with Dr. 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 He then underwent an image-guided cerebral angiogram with revascularization of his vertebrobasilar junction/proximal basilar artery stenosis using stent assisted angioplasty 12/03/2017 by Dr. [name] - later had  ISR requiring repeat procedure 02/2018 Past Medical History:  Diagnosis Date  . Asthma   . Chest pain   . CHF (congestive heart failure) ([place])   . Coronary artery disease    a. s/p DES x2 to LAD and DES to OM in 11/2017  . Depression   . Dyspnea   . GERD (gastroesophageal reflux disease)   . Headache   . History of kidney stones   . History of rhabdomyolysis   . Hyperlipidemia   . Hypertension   . Hypothyroidism   . Ischemic cardiomyopathy    a. 11/2017: echo showing EF of 35-40%, diffuse HK, and Grade 2 DD  . Kidney calculus 2014  . Pericardial effusion    Small, by dobutamine echocardiogram, 04/2006  . PONV (postoperative nausea and vomiting)   . Sleep apnea    does not wear cpap  . Stroke ([place])   . Tobacco abuse   . Type 2 diabetes mellitus without complications ([place]) 5/42/7062  . Vitamin D deficiency      Allergies  Allergen Reactions  . Crestor [Rosuvastatin] Other (See Comments)    rhabdomyolosis  . Trulicity [Dulaglutide] Nausea And Vomiting  . Bee Venom Swelling    SWELLING REACTION UNSPECIFIED      Current Outpatient Medications  Medication Sig Dispense Refill  . amLODipine (NORVASC) 5 MG tablet Take 1 tablet (5 mg total) by mouth daily. 90 tablet 3  . aspirin 81 MG EC tablet Take 1 tablet (81 mg total) by mouth daily. With food 30 tablet 0  . benzonatate (TESSALON) 100 MG  capsule Take 2 capsules (200 mg total) by mouth 3 (three) times daily as needed. 30 capsule 0  . Blood Glucose Monitoring Suppl w/Device KIT Dispense based on patient and insurance preference. Check blood sugar daily before breakfast (ICD9 250.0) 1 each 0  . clopidogrel (PLAVIX) 75 MG tablet Take 75 mg by mouth daily.    Marland Kitchen ezetimibe (ZETIA) 10 MG tablet Take 1 tablet (10 mg total) by mouth daily. 30 tablet 3  . famotidine (PEPCID) 20 MG tablet Take 20 mg by mouth 2 (two) times daily.    . fenofibrate (TRICOR) 145 MG tablet Take 145 mg by mouth daily.    Marland Kitchen glimepiride (AMARYL) 2 MG tablet  Take 1 tablet (2 mg total) by mouth daily with breakfast. 30 tablet 0  . Levothyroxine Sodium (EUTHYROX PO) Take 300 mg by mouth.    . nitroGLYCERIN (NITROSTAT) 0.4 MG SL tablet Place 1 tablet (0.4 mg total) under the tongue every 5 (five) minutes as needed for chest pain. 25 tablet 3  . oxyCODONE-acetaminophen (PERCOCET/ROXICET) 5-325 MG tablet Take 1 tablet by mouth every 4 (four) hours as needed for severe pain. 6 tablet 0  . sitaGLIPtin (JANUVIA) 100 MG tablet Take 100 mg by mouth daily.     No current facility-administered medications for this visit.     Past Surgical History:  Procedure Laterality Date  . APPENDECTOMY    . 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 CHOLECYSTECTOMY N/A 01/27/2013   Procedure: LAPAROSCOPIC CHOLECYSTECTOMY;  Surgeon: Jamesetta So, MD;  Location: [facility];  Service: General;  Laterality: N/A;  . CORONARY STENT INTERVENTION N/A 11/15/2017   Procedure: CORONARY STENT INTERVENTION;  Surgeon: Leonie Man, MD;  Location: [place] [facility];  Service: Cardiovascular;  Laterality: N/A;  . HERNIA REPAIR     As a child  . INSERTION OF MESH N/A 11/13/2012   Procedure: INSERTION OF MESH;  Surgeon: Jamesetta So, MD;  Location: [facility];  Service: General;  Laterality: N/A;  . IR ANGIO INTRA EXTRACRAN SEL COM CAROTID INNOMINATE BILAT MOD SED  12/01/2017  . IR ANGIO INTRA EXTRACRAN SEL COM CAROTID INNOMINATE BILAT MOD SED  02/13/2018  . IR ANGIO VERTEBRAL SEL SUBCLAVIAN INNOMINATE UNI L MOD SED  02/13/2018  . IR ANGIO VERTEBRAL SEL VERTEBRAL BILAT MOD SED  12/01/2017  . IR ANGIO VERTEBRAL SEL VERTEBRAL UNI R MOD SED  02/13/2018  . IR INTRA CRAN STENT  12/03/2017  . IR PTA INTRACRANIAL  02/25/2018  . IR US GUIDE VASC ACCESS RIGHT  02/13/2018  . KIDNEY STONE SURGERY    . LEFT HEART CATH AND CORONARY ANGIOGRAPHY N/A 11/15/2017   Procedure: LEFT HEART CATH AND CORONARY ANGIOGRAPHY;  Surgeon:  Leonie Man, MD;  Location: [facility];  Service: Cardiovascular;  Laterality: N/A;  . PERCUTANEOUS NEPHROLITHOTRIPSY    . RADIOLOGY WITH ANESTHESIA Left 12/03/2017   Procedure: Angioplasty with possible stenting of left VBJ;  Surgeon: Luanne Bras, MD;  Location: [place];  Service: Radiology;  Laterality: Left;  . RADIOLOGY WITH ANESTHESIA N/A 02/25/2018   Procedure: STENT PLACEMENT;  Surgeon: Luanne Bras, MD;  Location: [place];  Service: Radiology;  Laterality: N/A;  . UMBILICAL HERNIA REPAIR N/A 11/13/2012   Procedure: UMBILICAL HERNIORRHAPHY;  Surgeon: Jamesetta So, MD;  Location: [facility];  Service: General;  Laterality: N/A;  . VARICOCELECTOMY       Allergies  Allergen Reactions  . Crestor [Rosuvastatin] Other (See Comments)    rhabdomyolosis  . Trulicity [Dulaglutide] Nausea And Vomiting  . Bee Venom Swelling    SWELLING REACTION UNSPECIFIED       Family History  Problem Relation Age of Onset  . Coronary artery disease Father   . Hypertension Father   . Hyperlipidemia Father   . Diabetes Father   . Congestive Heart Failure Father 49  . Arrhythmia Father        had an ICD  . Diabetes Mother   . Hypertension Mother   . Hyperlipidemia Mother   . Obesity Mother 16       died after bariatric surgery, liver failure and infection  . Coronary artery disease Maternal Grandfather        both grandfathers and several uncles  . Coronary artery disease Other   . Diabetes Sister        both sisters  . Stroke Maternal Aunt   . Stroke Maternal Uncle      Social History Mr. Jolliff reports that he has been smoking cigarettes. He started smoking about 34 years ago. He has a 13.00 pack-year smoking history. He quit smokeless tobacco use about 32 years ago.  His smokeless tobacco use included snuff and chew. Mr. Crochet reports no history of alcohol use.   Review of Systems CONSTITUTIONAL: No weight loss, fever, chills, weakness or fatigue.  HEENT: Eyes:  No visual loss, blurred vision, double vision or yellow sclerae.No hearing loss, sneezing, congestion, runny nose or sore throat.  SKIN: No rash or itching.  CARDIOVASCULAR:  RESPIRATORY: No shortness of breath, cough or sputum.  GASTROINTESTINAL: No anorexia, nausea, vomiting or diarrhea. No abdominal pain or blood.  GENITOURINARY: No burning on urination, no polyuria NEUROLOGICAL: No headache, dizziness, syncope, paralysis, ataxia, numbness or tingling in the extremities. No change in bowel or bladder control.  MUSCULOSKELETAL: No muscle, back pain, joint pain or stiffness.  LYMPHATICS: No enlarged nodes. No history of splenectomy.  PSYCHIATRIC: No history of depression or anxiety.  ENDOCRINOLOGIC: No reports of sweating, cold or heat intolerance. No polyuria or polydipsia.  Marland Kitchen   Physical Examination There were no vitals filed for this visit. There were no vitals filed for this visit.  Gen: resting comfortably, no acute distress HEENT: no scleral icterus, pupils equal round and reactive, no palptable cervical adenopathy,  CV Resp: Clear to auscultation bilaterally GI: abdomen is soft, non-tender, non-distended, normal bowel sounds, no hepatosplenomegaly MSK: extremities are warm, no edema.  Skin: warm, no rash Neuro:  no focal deficits Psych: appropriate affect   Diagnostic Studies  11/2017 cath  There is moderate left ventricular systolic dysfunction. The left ventricular ejection fraction is 35-45% by visual estimate.  LV end diastolic pressure is mildly elevated.  -----------------------------------------  ANGIOGRAPHY- PCI  LESION #1: Mid LAD lesion is 80% stenosed.  A drug-eluting stent was successfully placed using a STENT SYNERGY DES 2.25X16. -Postdilated to 2.4 mm  Post intervention, there is a 0% residual stenosis.  LESION #2: mid LAD to Dist LAD lesion is 90% stenosed.  A drug-eluting stent was successfully placed using a STENT SYNERGY DES 2.25X12.  -Postdilated to 2.4 mm  Post intervention, there is a 0% residual stenosis.  Prox LAD lesion is 40% stenosed -prior to lesion 1; apical, after lesion 2  A drug-eluting stent was successfully placed using a STENT SYNERGY DES 2.25X12. -Postdilated to 2.4 mm  -----------------------------------------  Post intervention, there is a 0% residual stenosis.  Trifurcation  90% lesion at the distal circumflex into LPL and PDA -small caliber distal vessels. Best treated medically as this is not a very good PCI target  Prox small caliber nondominant RCA lesion is 99% stenosed.  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  Subtotally occluded nondominant RCA Moderately reduced LVEF of roughly 40% with mildly elevated LVEDP.  Plan: Transfer to postprocedure unit for post PCI monitoring and TR removal Continue aggressive risk factor modification with statin and beta-blocker etc.  Recommend uninterrupted dual antiplatelet therapy with Aspirin 64m daily and Ticagrelor 931mtwice dailyfor a minimum of 12 months (ACS - Class I recommendation).-Would strongly consider reducing to 60 mg dose ticagrelor at 9-12 months and continue for an additional year.  Would be okay to stop aspirin after 3 months if necessary. Okay to stop Brilinta after 6 months for procedures if necessary.   10/2018 nuclear stress  There was no ST segment deviation noted during stress.  Defect 1: There is a small defect of mild severity present in the mid inferoseptal location. This is likely due to soft tissue attenuation artifact.  This is a low risk study. No ischemic zones.  Nuclear stress EF: 46   Assessment and Plan   1. CAD/ICM/SOB -cardiac wise doing well, continue current meds  2. COPD - abnormal PFTs 02/2019, I believe this is the cause of his ongoing SOB - he is to  discuss with pcp at health dept if any assistance avaialble to obtain inhalers  3. Hyperlipidemia - marked elevated TGs in setting of severe hypothyroidism and poorly controlled DM2 - I strongly encouraged him to f/u with pcp to reassess status of both these issues, and also have his labs repeated there - continue fenofibrate, zetia. He is intolerant to statins     JoArnoldo LenisM.D., F.A.C.C.

## 2019-07-28 ENCOUNTER — Other Ambulatory Visit: Payer: Self-pay

## 2019-07-28 ENCOUNTER — Ambulatory Visit (INDEPENDENT_AMBULATORY_CARE_PROVIDER_SITE_OTHER): Payer: Self-pay | Admitting: Cardiology

## 2019-07-28 ENCOUNTER — Encounter: Payer: Self-pay | Admitting: Cardiology

## 2019-07-28 VITALS — BP 120/64 | HR 72 | Temp 98.7°F | Ht 72.0 in | Wt 260.0 lb

## 2019-07-28 DIAGNOSIS — I255 Ischemic cardiomyopathy: Secondary | ICD-10-CM

## 2019-07-28 DIAGNOSIS — E782 Mixed hyperlipidemia: Secondary | ICD-10-CM

## 2019-07-28 DIAGNOSIS — I2510 Atherosclerotic heart disease of native coronary artery without angina pectoris: Secondary | ICD-10-CM

## 2019-07-28 MED ORDER — LOSARTAN POTASSIUM 25 MG PO TABS: 12.5000 mg | ORAL_TABLET | Freq: Every day | ORAL | 3 refills | Status: DC

## 2019-07-28 NOTE — Progress Notes (Signed)
Clinical Summary Devin [name] is a 49 y.o.male seen today for follow up of the following medical problems.     1. CAD/Chest pain/Chronic systolic HF - admitted 09/4257 with chest pain concerning for unstable angina.  11/2017 coronary CTA with severe prox LAD disease and possible mid LAD disease and D1.  11/2017 cath LM patent, LAD mid 80 to 90%, LCX distal 90%, OM2 90%, LPDA 90%, RCA small with prox 99% LVgram 35-45% (no echo on file).DES x 2to LAD, DES to OM, trifurcation lesion distal LCX, LPL and PDA recs for medical therapy, poor pci target. RCA small vessel treated medically.  Interventional cards recommended extended DAPT  01/2018 echo: LVEF 35-40%, grade I diastoluc dysfunction - bradycardia on beta blocketers. History of rhabdo on crestor.Low bp and dizzienss on entresto.    10/2018 nuclear stress: no clear ischemia. LVEF 46% - compliant with meds - no exertional chest pain symptoms.     2. COPD - abnormal PFTs 02/2019  - +coughing, +wheezing. +coughing. - was able to get inhalers, he is not sure of names. He is compliant with inhalers.    2. Hyperlipidemia - history of rhabdo on crestor - on zetia. Fenofibrate. Has had severely elevated TGs in setting of very poorly controlled DM2 and also severe hypothyroidism     3. History of CVA - history of CVA 11/2017 - hadunderwent an image-guided diagnostic cerebral angiogram 12/01/2017 with Dr. 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 He then underwent an image-guided cerebral angiogram with revascularization of his vertebrobasilar junction/proximal basilar artery stenosis using stent assisted angioplasty 12/03/2017 by Dr. [name] - later had ISR requiring repeat procedure 02/2018     SH: has 2 boxer dogs at home.  Has not been in favor of getting covid vaccine.   Past Medical History:  Diagnosis Date  . Asthma   . Chest pain     . CHF (congestive heart failure) ([place])   . Coronary artery disease    a. s/p DES x2 to LAD and DES to OM in 11/2017  . Depression   . Dyspnea   . GERD (gastroesophageal reflux disease)   . Headache   . History of kidney stones   . History of rhabdomyolysis   . Hyperlipidemia   . Hypertension   . Hypothyroidism   . Ischemic cardiomyopathy    a. 11/2017: echo showing EF of 35-40%, diffuse HK, and Grade 2 DD  . Kidney calculus 2014  . Pericardial effusion    Small, by dobutamine echocardiogram, 04/2006  . PONV (postoperative nausea and vomiting)   . Sleep apnea    does not wear cpap  . Stroke ([place])   . Tobacco abuse   . Type 2 diabetes mellitus without complications ([place]) 5/63/8756  . Vitamin D deficiency      Allergies  Allergen Reactions  . Crestor [Rosuvastatin] Other (See Comments)    rhabdomyolosis  . Trulicity [Dulaglutide] Nausea And Vomiting  . Bee Venom Swelling    SWELLING REACTION UNSPECIFIED      Current Outpatient Medications  Medication Sig Dispense Refill  . amLODipine (NORVASC) 5 MG tablet Take 1 tablet (5 mg total) by mouth daily. 90 tablet 3  . aspirin 81 MG EC tablet Take 1 tablet (81 mg total) by mouth daily. With food 30 tablet 0  . benzonatate (TESSALON) 100 MG capsule Take 2 capsules (200 mg total) by mouth 3 (three) times daily as needed. 30 capsule 0  .  Blood Glucose Monitoring Suppl w/Device KIT Dispense based on patient and insurance preference. Check blood sugar daily before breakfast (ICD9 250.0) 1 each 0  . clopidogrel (PLAVIX) 75 MG tablet Take 75 mg by mouth daily.    Marland Kitchen ezetimibe (ZETIA) 10 MG tablet Take 1 tablet (10 mg total) by mouth daily. 30 tablet 3  . famotidine (PEPCID) 20 MG tablet Take 20 mg by mouth 2 (two) times daily.    . fenofibrate (TRICOR) 145 MG tablet Take 145 mg by mouth daily.    Marland Kitchen glimepiride (AMARYL) 2 MG tablet Take 1 tablet (2 mg total) by mouth daily with breakfast. 30 tablet 0  . Levothyroxine Sodium (EUTHYROX  PO) Take 300 mg by mouth.    . nitroGLYCERIN (NITROSTAT) 0.4 MG SL tablet Place 1 tablet (0.4 mg total) under the tongue every 5 (five) minutes as needed for chest pain. 25 tablet 3  . oxyCODONE-acetaminophen (PERCOCET/ROXICET) 5-325 MG tablet Take 1 tablet by mouth every 4 (four) hours as needed for severe pain. 6 tablet 0  . sitaGLIPtin (JANUVIA) 100 MG tablet Take 100 mg by mouth daily.     No current facility-administered medications for this visit.     Past Surgical History:  Procedure Laterality Date  . APPENDECTOMY    . 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 CHOLECYSTECTOMY N/A 01/27/2013   Procedure: LAPAROSCOPIC CHOLECYSTECTOMY;  Surgeon: Jamesetta So, MD;  Location: [facility];  Service: General;  Laterality: N/A;  . CORONARY STENT INTERVENTION N/A 11/15/2017   Procedure: CORONARY STENT INTERVENTION;  Surgeon: Leonie Man, MD;  Location: [place] [facility];  Service: Cardiovascular;  Laterality: N/A;  . HERNIA REPAIR     As a child  . INSERTION OF MESH N/A 11/13/2012   Procedure: INSERTION OF MESH;  Surgeon: Jamesetta So, MD;  Location: [facility];  Service: General;  Laterality: N/A;  . IR ANGIO INTRA EXTRACRAN SEL COM CAROTID INNOMINATE BILAT MOD SED  12/01/2017  . IR ANGIO INTRA EXTRACRAN SEL COM CAROTID INNOMINATE BILAT MOD SED  02/13/2018  . IR ANGIO VERTEBRAL SEL SUBCLAVIAN INNOMINATE UNI L MOD SED  02/13/2018  . IR ANGIO VERTEBRAL SEL VERTEBRAL BILAT MOD SED  12/01/2017  . IR ANGIO VERTEBRAL SEL VERTEBRAL UNI R MOD SED  02/13/2018  . IR INTRA CRAN STENT  12/03/2017  . IR PTA INTRACRANIAL  02/25/2018  . IR US GUIDE VASC ACCESS RIGHT  02/13/2018  . KIDNEY STONE SURGERY    . LEFT HEART CATH AND CORONARY ANGIOGRAPHY N/A 11/15/2017   Procedure: LEFT HEART CATH AND CORONARY ANGIOGRAPHY;  Surgeon: Leonie Man, MD;  Location: [place] [facility];  Service: Cardiovascular;  Laterality: N/A;  .  PERCUTANEOUS NEPHROLITHOTRIPSY    . RADIOLOGY WITH ANESTHESIA Left 12/03/2017   Procedure: Angioplasty with possible stenting of left VBJ;  Surgeon: Luanne Bras, MD;  Location: [place];  Service: Radiology;  Laterality: Left;  . RADIOLOGY WITH ANESTHESIA N/A 02/25/2018   Procedure: STENT PLACEMENT;  Surgeon: Luanne Bras, MD;  Location: [place];  Service: Radiology;  Laterality: N/A;  . UMBILICAL HERNIA REPAIR N/A 11/13/2012   Procedure: UMBILICAL HERNIORRHAPHY;  Surgeon: Jamesetta So, MD;  Location: [facility];  Service: General;  Laterality: N/A;  . VARICOCELECTOMY       Allergies  Allergen Reactions  . Crestor [Rosuvastatin] Other (See Comments)    rhabdomyolosis  . Trulicity [Dulaglutide] Nausea And  Vomiting  . Bee Venom Swelling    SWELLING REACTION UNSPECIFIED       Family History  Problem Relation Age of Onset  . Coronary artery disease Father   . Hypertension Father   . Hyperlipidemia Father   . Diabetes Father   . Congestive Heart Failure Father 68  . Arrhythmia Father        had an ICD  . Diabetes Mother   . Hypertension Mother   . Hyperlipidemia Mother   . Obesity Mother 63       died after bariatric surgery, liver failure and infection  . Coronary artery disease Maternal Grandfather        both grandfathers and several uncles  . Coronary artery disease Other   . Diabetes Sister        both sisters  . Stroke Maternal Aunt   . Stroke Maternal Uncle      Social History Mr. Faulkenberry reports that he has been smoking cigarettes. He started smoking about 34 years ago. He has a 13.00 pack-year smoking history. He quit smokeless tobacco use about 32 years ago.  His smokeless tobacco use included snuff and chew. Mr. Sevey reports no history of alcohol use.   Review of Systems CONSTITUTIONAL: No weight loss, fever, chills, weakness or fatigue.  HEENT: Eyes: No visual loss, blurred vision, double vision or yellow sclerae.No hearing loss, sneezing, congestion,  runny nose or sore throat.  SKIN: No rash or itching.  CARDIOVASCULAR: per hpi RESPIRATORY: No shortness of breath, cough or sputum.  GASTROINTESTINAL: No anorexia, nausea, vomiting or diarrhea. No abdominal pain or blood.  GENITOURINARY: No burning on urination, no polyuria NEUROLOGICAL: No headache, dizziness, syncope, paralysis, ataxia, numbness or tingling in the extremities. No change in bowel or bladder control.  MUSCULOSKELETAL: No muscle, back pain, joint pain or stiffness.  LYMPHATICS: No enlarged nodes. No history of splenectomy.  PSYCHIATRIC: No history of depression or anxiety.  ENDOCRINOLOGIC: No reports of sweating, cold or heat intolerance. No polyuria or polydipsia.  Marland Kitchen   Physical Examination Today's Vitals   07/28/19 1410  BP: 120/64  Pulse: 72  Temp: 98.7 F (37.1 C)  SpO2: 97%  Weight: 260 lb (117.9 kg)  Height: 6' (1.829 m)   Body mass index is 35.26 kg/m.  Gen: resting comfortably, no acute distress HEENT: no scleral icterus, pupils equal round and reactive, no palptable cervical adenopathy,  CV: RRR, no m/r/g, no jvd Resp: Clear to auscultation bilaterally GI: abdomen is soft, non-tender, non-distended, normal bowel sounds, no hepatosplenomegaly MSK: extremities are warm, no edema.  Skin: warm, no rash Neuro:  no focal deficits Psych: appropriate affect   Diagnostic Studies  11/2017 cath  There is moderate left ventricular systolic dysfunction. The left ventricular ejection fraction is 35-45% by visual estimate.  LV end diastolic pressure is mildly elevated.  -----------------------------------------  ANGIOGRAPHY- PCI  LESION #1: Mid LAD lesion is 80% stenosed.  A drug-eluting stent was successfully placed using a STENT SYNERGY DES 2.25X16. -Postdilated to 2.4 mm  Post intervention, there is a 0% residual stenosis.  LESION #2: mid LAD to Dist LAD lesion is 90% stenosed.  A drug-eluting stent was successfully placed using a STENT SYNERGY  DES 2.25X12. -Postdilated to 2.4 mm  Post intervention, there is a 0% residual stenosis.  Prox LAD lesion is 40% stenosed -prior to lesion 1; apical, after lesion 2  A drug-eluting stent was successfully placed using a STENT SYNERGY DES 2.25X12. -Postdilated to 2.4 mm  -----------------------------------------  Post intervention, there is a 0% residual stenosis.  Trifurcation 90% lesion at the distal circumflex into LPL and PDA -small caliber distal vessels. Best treated medically as this is not a very good PCI target  Prox small caliber nondominant RCA lesion is 99% stenosed.  Severe distal OM 2 -DES PCI with Synergy 2.25 mm x16 mm (2.4 mm); severe trifurcation disease with a very distal AV groove circumflex prior to PDA and PL Graciemae Delisle -medical management.  Subtotally occluded nondominant RCA Moderately reduced LVEF of roughly 40% with mildly elevated LVEDP.  Plan: Transfer to postprocedure unit for post PCI monitoring and TR removal Continue aggressive risk factor modification with statin and beta-blocker etc.  Recommend uninterrupted dual antiplatelet therapy with Aspirin 534m daily and Ticagrelor 949mtwice dailyfor a minimum of 12 months (ACS - Class I recommendation).-Would strongly consider reducing to 60 mg dose ticagrelor at 9-12 months and continue for an additional year.  Would be okay to stop aspirin after 3 months if necessary. Okay to stop Brilinta after 6 months for procedures if necessary.   10/2018 nuclear stress  There was no ST segment deviation noted during stress.  Defect 1: There is a small defect of mild severity present in the mid inferoseptal location. This is likely due to soft tissue attenuation artifact.  This is a low risk study. No ischemic zones.  Nuclear stress EF: 46   Assessment and Plan   1. CAD/ICM/SOB -no exertional chest pains, recent stress test was benign - medical therapy limited as reported above, he had been on low dose  losartan unclear what happnened, he is unsure. Restart losartan 12.34m72maily.    2. Hyperlipidemia - marked elevated TGs in setting of severe hypothyroidism and poorly controlled DM2 -continue fenofibrate, continue manaement of his DM2 and hypothyroidism  3. History of CVA - prior cerebral stenting, will check if f/u is needed with Dr DevEstanislado Pandy  F/u 6 months  JonArnoldo Lenis.D.

## 2019-07-28 NOTE — Patient Instructions (Signed)
Medication Instructions:  Your physician has recommended you make the following change in your medication:  Losartan 12.5 mg Daily   *If you need a refill on your cardiac medications before your next appointment, please call your pharmacy*   Lab Work: NONE   If you have labs (blood work) drawn today and your tests are completely normal, you will receive your results only by: Marland Kitchen MyChart Message (if you have MyChart) OR . A paper copy in the mail If you have any lab test that is abnormal or we need to change your treatment, we will call you to review the results.   Testing/Procedures: NONE   Follow-Up: At [facility], you and your health needs are our priority.  As part of our continuing mission to provide you with exceptional heart care, we have created designated Provider Care Teams.  These Care Teams include your primary Cardiologist (physician) and Advanced Practice Providers (APPs -  Physician Assistants and Nurse Practitioners) who all work together to provide you with the care you need, when you need it.  We recommend signing up for the patient portal called "MyChart".  Sign up information is provided on this After Visit Summary.  MyChart is used to connect with patients for Virtual Visits (Telemedicine).  Patients are able to view lab/test results, encounter notes, upcoming appointments, etc.  Non-urgent messages can be sent to your provider as well.   To learn more about what you can do with MyChart, go to [email or web address].    Your next appointment:   6 month(s)  The format for your next appointment:   Either In Person or Virtual  Provider:   Dina Rich, MD   Other Instructions Thank you for choosing [facility]!

## 2019-10-14 ENCOUNTER — Other Ambulatory Visit: Payer: Self-pay

## 2019-10-14 ENCOUNTER — Encounter (HOSPITAL_COMMUNITY): Payer: Self-pay | Admitting: Emergency Medicine

## 2019-10-14 DIAGNOSIS — I130 Hypertensive heart and chronic kidney disease with heart failure and stage 1 through stage 4 chronic kidney disease, or unspecified chronic kidney disease: Secondary | ICD-10-CM | POA: Insufficient documentation

## 2019-10-14 DIAGNOSIS — E1122 Type 2 diabetes mellitus with diabetic chronic kidney disease: Secondary | ICD-10-CM | POA: Insufficient documentation

## 2019-10-14 DIAGNOSIS — Z20822 Contact with and (suspected) exposure to covid-19: Secondary | ICD-10-CM | POA: Insufficient documentation

## 2019-10-14 DIAGNOSIS — N183 Chronic kidney disease, stage 3 unspecified: Secondary | ICD-10-CM | POA: Diagnosis not present

## 2019-10-14 DIAGNOSIS — J45909 Unspecified asthma, uncomplicated: Secondary | ICD-10-CM | POA: Insufficient documentation

## 2019-10-14 DIAGNOSIS — Z7982 Long term (current) use of aspirin: Secondary | ICD-10-CM | POA: Insufficient documentation

## 2019-10-14 DIAGNOSIS — I2510 Atherosclerotic heart disease of native coronary artery without angina pectoris: Secondary | ICD-10-CM | POA: Diagnosis not present

## 2019-10-14 DIAGNOSIS — Z7984 Long term (current) use of oral hypoglycemic drugs: Secondary | ICD-10-CM | POA: Diagnosis not present

## 2019-10-14 DIAGNOSIS — I509 Heart failure, unspecified: Secondary | ICD-10-CM | POA: Insufficient documentation

## 2019-10-14 DIAGNOSIS — R0789 Other chest pain: Principal | ICD-10-CM | POA: Insufficient documentation

## 2019-10-14 DIAGNOSIS — I5042 Chronic combined systolic (congestive) and diastolic (congestive) heart failure: Secondary | ICD-10-CM | POA: Insufficient documentation

## 2019-10-14 DIAGNOSIS — E039 Hypothyroidism, unspecified: Secondary | ICD-10-CM | POA: Diagnosis not present

## 2019-10-14 DIAGNOSIS — F17210 Nicotine dependence, cigarettes, uncomplicated: Secondary | ICD-10-CM | POA: Insufficient documentation

## 2019-10-14 DIAGNOSIS — Z79899 Other long term (current) drug therapy: Secondary | ICD-10-CM | POA: Insufficient documentation

## 2019-10-14 DIAGNOSIS — R931 Abnormal findings on diagnostic imaging of heart and coronary circulation: Secondary | ICD-10-CM | POA: Diagnosis not present

## 2019-10-14 NOTE — ED Triage Notes (Signed)
Patient states chest pain with left arm pain and neck pain that started x 3 days ago. Patient does have a cardiac history.

## 2019-10-15 ENCOUNTER — Emergency Department (HOSPITAL_COMMUNITY): Payer: Medicaid Other

## 2019-10-15 ENCOUNTER — Encounter (HOSPITAL_COMMUNITY): Payer: Self-pay | Admitting: Internal Medicine

## 2019-10-15 ENCOUNTER — Observation Stay (HOSPITAL_COMMUNITY)
Admission: EM | Admit: 2019-10-15 | Discharge: 2019-10-15 | Disposition: A | Discharge status: Home / Self Care | Payer: Medicaid Other | Attending: Family Medicine | Admitting: Family Medicine

## 2019-10-15 ENCOUNTER — Observation Stay (HOSPITAL_BASED_OUTPATIENT_CLINIC_OR_DEPARTMENT_OTHER): Payer: Medicaid Other

## 2019-10-15 DIAGNOSIS — I255 Ischemic cardiomyopathy: Secondary | ICD-10-CM | POA: Diagnosis not present

## 2019-10-15 DIAGNOSIS — R079 Chest pain, unspecified: Secondary | ICD-10-CM | POA: Diagnosis not present

## 2019-10-15 DIAGNOSIS — R0789 Other chest pain: Secondary | ICD-10-CM | POA: Diagnosis not present

## 2019-10-15 DIAGNOSIS — G450 Vertebro-basilar artery syndrome: Secondary | ICD-10-CM | POA: Diagnosis present

## 2019-10-15 DIAGNOSIS — I5042 Chronic combined systolic (congestive) and diastolic (congestive) heart failure: Secondary | ICD-10-CM | POA: Diagnosis not present

## 2019-10-15 DIAGNOSIS — F17210 Nicotine dependence, cigarettes, uncomplicated: Secondary | ICD-10-CM | POA: Diagnosis present

## 2019-10-15 DIAGNOSIS — N189 Chronic kidney disease, unspecified: Secondary | ICD-10-CM | POA: Diagnosis present

## 2019-10-15 DIAGNOSIS — I5022 Chronic systolic (congestive) heart failure: Secondary | ICD-10-CM

## 2019-10-15 DIAGNOSIS — I2510 Atherosclerotic heart disease of native coronary artery without angina pectoris: Secondary | ICD-10-CM | POA: Diagnosis not present

## 2019-10-15 DIAGNOSIS — I651 Occlusion and stenosis of basilar artery: Secondary | ICD-10-CM | POA: Diagnosis not present

## 2019-10-15 DIAGNOSIS — E1159 Type 2 diabetes mellitus with other circulatory complications: Secondary | ICD-10-CM

## 2019-10-15 DIAGNOSIS — R200 Anesthesia of skin: Secondary | ICD-10-CM

## 2019-10-15 DIAGNOSIS — E785 Hyperlipidemia, unspecified: Secondary | ICD-10-CM | POA: Diagnosis not present

## 2019-10-15 DIAGNOSIS — E039 Hypothyroidism, unspecified: Secondary | ICD-10-CM | POA: Diagnosis present

## 2019-10-15 DIAGNOSIS — Z720 Tobacco use: Secondary | ICD-10-CM

## 2019-10-15 DIAGNOSIS — E119 Type 2 diabetes mellitus without complications: Secondary | ICD-10-CM

## 2019-10-15 DIAGNOSIS — F17200 Nicotine dependence, unspecified, uncomplicated: Secondary | ICD-10-CM | POA: Diagnosis present

## 2019-10-15 DIAGNOSIS — I10 Essential (primary) hypertension: Secondary | ICD-10-CM | POA: Diagnosis present

## 2019-10-15 DIAGNOSIS — R931 Abnormal findings on diagnostic imaging of heart and coronary circulation: Secondary | ICD-10-CM | POA: Diagnosis present

## 2019-10-15 LAB — CBC
HCT: 49.8 % (ref 39.0–52.0)
HCT: 50.2 % (ref 39.0–52.0)
Hemoglobin: 16.3 g/dL (ref 13.0–17.0)
Hemoglobin: 16.2 g/dL (ref 13.0–17.0)
MCH: 29.5 pg (ref 26.0–34.0)
MCH: 28.7 pg (ref 26.0–34.0)
MCHC: 32.7 g/dL (ref 30.0–36.0)
MCHC: 32.3 g/dL (ref 30.0–36.0)
MCV: 90.2 fL (ref 80.0–100.0)
MCV: 88.8 fL (ref 80.0–100.0)
Platelets: 193 10*3/uL (ref 150–400)
Platelets: 216 10*3/uL (ref 150–400)
RBC: 5.52 MIL/uL (ref 4.22–5.81)
RBC: 5.65 MIL/uL (ref 4.22–5.81)
RDW: 16.6 % — ABNORMAL HIGH (ref 11.5–15.5)
RDW: 16.1 % — ABNORMAL HIGH (ref 11.5–15.5)
WBC: 10.7 10*3/uL — ABNORMAL HIGH (ref 4.0–10.5)
WBC: 11.7 10*3/uL — ABNORMAL HIGH (ref 4.0–10.5)
nRBC: 0.3 % — ABNORMAL HIGH (ref 0.0–0.2)
nRBC: 0 % (ref 0.0–0.2)

## 2019-10-15 LAB — CREATININE, SERUM
Creatinine, Ser: 1.1 mg/dL (ref 0.61–1.24)
GFR calc Af Amer: >60 mL/min (ref >60)
GFR calc non Af Amer: >60 mL/min (ref >60)

## 2019-10-15 LAB — TSH: TSH: 83.261 u[IU]/mL — ABNORMAL HIGH (ref 0.350–4.500)

## 2019-10-15 LAB — ECHOCARDIOGRAM COMPLETE
AR max vel: 1.99 cm2
AV Area VTI: 1.92 cm2
AV Area mean vel: 2.06 cm2
AV Mean grad: 3.5 mmHg
AV Peak grad: 7.3 mmHg
Ao pk vel: 1.35 m/s
Area-P 1/2: 3.15 cm2
S' Lateral: 3.38 cm

## 2019-10-15 LAB — BASIC METABOLIC PANEL
Anion gap: 12 (ref 5–15)
BUN: 14 mg/dL (ref 6–20)
CO2: 26 mmol/L (ref 22–32)
Calcium: 10.1 mg/dL (ref 8.9–10.3)
Chloride: 93 mmol/L — ABNORMAL LOW (ref 98–111)
Creatinine, Ser: 1.11 mg/dL (ref 0.61–1.24)
GFR calc Af Amer: >60 mL/min (ref >60)
GFR calc non Af Amer: >60 mL/min (ref >60)
Glucose, Bld: 380 mg/dL — ABNORMAL HIGH (ref 70–99)
Potassium: 3.7 mmol/L (ref 3.5–5.1)
Sodium: 131 mmol/L — ABNORMAL LOW (ref 135–145)

## 2019-10-15 LAB — LIPID PANEL
Cholesterol: 709 mg/dL — ABNORMAL HIGH (ref 0–200)
HDL: 36 mg/dL — ABNORMAL LOW (ref >40)
LDL Cholesterol: UNDETERMINED mg/dL (ref 0–99)
Triglycerides: 2420 mg/dL — ABNORMAL HIGH (ref <150)
VLDL: UNDETERMINED mg/dL (ref 0–40)

## 2019-10-15 LAB — HEMOGLOBIN A1C
Hgb A1c MFr Bld: 13.8 % — ABNORMAL HIGH (ref 4.8–5.6)
Mean Plasma Glucose: 349.36 mg/dL

## 2019-10-15 LAB — BRAIN NATRIURETIC PEPTIDE: B Natriuretic Peptide: 42 pg/mL (ref 0.0–100.0)

## 2019-10-15 LAB — SARS CORONAVIRUS 2 BY RT PCR (HOSPITAL ORDER, PERFORMED IN ~~LOC~~ HOSPITAL LAB): SARS Coronavirus 2: NEGATIVE

## 2019-10-15 LAB — HIV ANTIBODY (ROUTINE TESTING W REFLEX): HIV Screen 4th Generation wRfx: NONREACTIVE

## 2019-10-15 LAB — T4, FREE: Free T4: <0.25 ng/dL — ABNORMAL LOW (ref 0.61–1.12)

## 2019-10-15 LAB — TROPONIN I (HIGH SENSITIVITY)
Troponin I (High Sensitivity): 6 ng/L (ref <18)
Troponin I (High Sensitivity): 6 ng/L (ref <18)

## 2019-10-15 LAB — VITAMIN D 25 HYDROXY (VIT D DEFICIENCY, FRACTURES): Vit D, 25-Hydroxy: 6.22 ng/mL — ABNORMAL LOW (ref 30–100)

## 2019-10-15 LAB — LDL CHOLESTEROL, DIRECT: Direct LDL: UNDETERMINED mg/dL (ref 0–99)

## 2019-10-15 MED ORDER — BLOOD GLUCOSE MONITORING SUPPL W/DEVICE KIT: PACK | 1 refills | Status: DC

## 2019-10-15 MED ORDER — FENOFIBRATE 160 MG PO TABS
160.0000 mg | ORAL_TABLET | Freq: Every day | ORAL | Status: DC
  Administered 2019-10-15: 160 mg via ORAL
  Filled 2019-10-15 (×3): qty 1

## 2019-10-15 MED ORDER — EUTHYROX 100 MCG PO TABS: 100.0000 ug | ORAL_TABLET | Freq: Every day | ORAL | 1 refills | Status: DC

## 2019-10-15 MED ORDER — LOSARTAN POTASSIUM 25 MG PO TABS
12.5000 mg | ORAL_TABLET | Freq: Every day | ORAL | Status: DC
  Administered 2019-10-15: 12.5 mg via ORAL
  Filled 2019-10-15: qty 1

## 2019-10-15 MED ORDER — FENOFIBRATE 145 MG PO TABS: 145.0000 mg | ORAL_TABLET | Freq: Every day | ORAL | 1 refills | Status: DC

## 2019-10-15 MED ORDER — CLOPIDOGREL BISULFATE 75 MG PO TABS: 75.0000 mg | ORAL_TABLET | Freq: Every day | ORAL | 1 refills | Status: AC

## 2019-10-15 MED ORDER — SITAGLIPTIN PHOSPHATE 100 MG PO TABS: 100.0000 mg | ORAL_TABLET | Freq: Every day | ORAL | 1 refills | Status: DC

## 2019-10-15 MED ORDER — EZETIMIBE 10 MG PO TABS: 10.0000 mg | ORAL_TABLET | Freq: Every day | ORAL | 1 refills | Status: AC

## 2019-10-15 MED ORDER — CLOPIDOGREL BISULFATE 75 MG PO TABS
75.0000 mg | ORAL_TABLET | Freq: Every day | ORAL | Status: DC
  Administered 2019-10-15: 75 mg via ORAL
  Filled 2019-10-15: qty 1

## 2019-10-15 MED ORDER — DULERA 200-5 MCG/ACT IN AERO: 2.0000 | INHALATION_SPRAY | Freq: Two times a day (BID) | RESPIRATORY_TRACT | 1 refills | Status: DC

## 2019-10-15 MED ORDER — IOHEXOL 350 MG/ML SOLN
75.0000 mL | Freq: Once | INTRAVENOUS | Status: AC | PRN
  Administered 2019-10-15: 75 mL via INTRAVENOUS

## 2019-10-15 MED ORDER — GLIMEPIRIDE 2 MG PO TABS: 2.0000 mg | ORAL_TABLET | Freq: Every day | ORAL | 1 refills | Status: DC

## 2019-10-15 MED ORDER — EZETIMIBE 10 MG PO TABS
10.0000 mg | ORAL_TABLET | Freq: Every day | ORAL | Status: DC
  Administered 2019-10-15: 10 mg via ORAL
  Filled 2019-10-15 (×3): qty 1

## 2019-10-15 MED ORDER — NITROGLYCERIN 0.4 MG SL SUBL: 0.4000 mg | SUBLINGUAL_TABLET | SUBLINGUAL | 1 refills | Status: DC | PRN

## 2019-10-15 MED ORDER — VITAMIN D (ERGOCALCIFEROL) 1.25 MG (50000 UNIT) PO CAPS: 50000.0000 [IU] | ORAL_CAPSULE | ORAL | 0 refills | Status: AC

## 2019-10-15 MED ORDER — ENOXAPARIN SODIUM 40 MG/0.4ML ~~LOC~~ SOLN
40.0000 mg | SUBCUTANEOUS | Status: DC
  Administered 2019-10-15: 40 mg via SUBCUTANEOUS
  Filled 2019-10-15: qty 0.4

## 2019-10-15 MED ORDER — ASPIRIN EC 81 MG PO TBEC
81.0000 mg | DELAYED_RELEASE_TABLET | Freq: Every day | ORAL | Status: DC
  Administered 2019-10-15: 81 mg via ORAL
  Filled 2019-10-15: qty 1

## 2019-10-15 MED ORDER — LOSARTAN POTASSIUM 25 MG PO TABS
12.5000 mg | ORAL_TABLET | Freq: Every day | ORAL | 1 refills | Status: DC
Start: 2019-10-15 — End: 2021-01-31

## 2019-10-15 NOTE — Progress Notes (Signed)
Inpatient Diabetes Program Recommendations  AACE/ADA: New Consensus Statement on Inpatient Glycemic Control (2015)  Target Ranges:  Prepandial:   less than 140 mg/dL      Peak postprandial:   less than 180 mg/dL (1-2 hours)      Critically ill patients:  140 - 180 mg/dL   Lab Results  Component Value Date   GLUCAP 256 (H) 05/15/2019   HGBA1C 7.2 (H) 02/11/2018    Review of Glycemic Control Results for Devin [name], Devin [name] (MRN [id number]) as of 10/15/2019 10:36  Ref. Range 10/15/2019 00:46  Glucose Latest Ref Range: 70 - 99 mg/dL 001 (H)   Diabetes history: Type 2 DM Outpatient Diabetes medications: Amaryl 2 mg QAM, Januvia 100 mg QD Current orders for Inpatient glycemic control: none  Inpatient Diabetes Program Recommendations:    Consider adding A1C and Novolog 0-15 units TID & HS.  Following.   Thanks, Lujean Rave, MSN, RNC-OB Diabetes Coordinator [phone] (8a-5p)

## 2019-10-15 NOTE — ED Notes (Signed)
Place DNR on pt.

## 2019-10-15 NOTE — H&P (Signed)
History and Physical  [facility]  Devin [name] VJD:[id number] DOB: [birth year] DOA: 10/15/2019  PCP: [facility] [place]  Patient coming from: Home   I have personally briefly reviewed patient's old medical records in [place]  Chief Complaint: chest pain   HPI: Devin [name] is a 49 y.o. male with medical history significant of CAD, CHF, poor medical compliance, dysplipidemia, HTN, hypothyroidism and asthma and other medical history below presented to ED with 2 days of atypical chest pain symptoms. He admits that he has not been taking any of his medications for at least 1 month.  He says that he did not want to follow the instructions and direction of providers at the health department where he was receiving primary care.  He says he made arrangements for a PCP follow up at a new clinic in [place] for October 22, 2019.  He denies shortness of breath.  He denies fever.  He was placed in observation for serial troponin testing and cardiology consultation.    Review of Systems: As per HPI otherwise 10 point review of systems negative.    Past Medical History:  Diagnosis Date  . Asthma   . Chest pain   . CHF (congestive heart failure) ([place])   . Coronary artery disease    a. s/p DES x2 to LAD and DES to OM in 11/2017  . Depression   . Dyspnea   . GERD (gastroesophageal reflux disease)   . Headache   . History of kidney stones   . History of rhabdomyolysis   . Hyperlipidemia   . Hypertension   . Hypothyroidism   . Ischemic cardiomyopathy    a. 11/2017: echo showing EF of 35-40%, diffuse HK, and Grade 2 DD  . Kidney calculus 2014  . Pericardial effusion    Small, by dobutamine echocardiogram, 04/2006  . PONV (postoperative nausea and vomiting)   . Sleep apnea    does not wear cpap  . Stroke ([place])   . Tobacco abuse   . Type 2 diabetes mellitus without complications ([place]) 07/28/9840  . Vitamin D deficiency     Past Surgical History:  Procedure  Laterality Date  . APPENDECTOMY    . 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 CHOLECYSTECTOMY N/A 01/27/2013   Procedure: LAPAROSCOPIC CHOLECYSTECTOMY;  Surgeon: Devin So, MD;  Location: [facility];  Service: General;  Laterality: N/A;  . CORONARY STENT INTERVENTION N/A 11/15/2017   Procedure: CORONARY STENT INTERVENTION;  Surgeon: Devin Man, MD;  Location: [place] [facility];  Service: Cardiovascular;  Laterality: N/A;  . HERNIA REPAIR     As a child  . INSERTION OF MESH N/A 11/13/2012   Procedure: INSERTION OF MESH;  Surgeon: Devin So, MD;  Location: [facility];  Service: General;  Laterality: N/A;  . IR ANGIO INTRA EXTRACRAN SEL COM CAROTID INNOMINATE BILAT MOD SED  12/01/2017  . IR ANGIO INTRA EXTRACRAN SEL COM CAROTID INNOMINATE BILAT MOD SED  02/13/2018  . IR ANGIO VERTEBRAL SEL SUBCLAVIAN INNOMINATE UNI L MOD SED  02/13/2018  . IR ANGIO VERTEBRAL SEL VERTEBRAL BILAT MOD SED  12/01/2017  . IR ANGIO VERTEBRAL SEL VERTEBRAL UNI R MOD SED  02/13/2018  . IR INTRA CRAN STENT  12/03/2017  . IR PTA INTRACRANIAL  02/25/2018  . IR US GUIDE VASC ACCESS RIGHT  02/13/2018  . KIDNEY STONE SURGERY    .  LEFT HEART CATH AND CORONARY ANGIOGRAPHY N/A 11/15/2017   Procedure: LEFT HEART CATH AND CORONARY ANGIOGRAPHY;  Surgeon: Devin Man, MD;  Location: [facility] [facility];  Service: Cardiovascular;  Laterality: N/A;  . PERCUTANEOUS NEPHROLITHOTRIPSY    . RADIOLOGY WITH ANESTHESIA Left 12/03/2017   Procedure: Angioplasty with possible stenting of left VBJ;  Surgeon: Devin Bras, MD;  Location: [place];  Service: Radiology;  Laterality: Left;  . RADIOLOGY WITH ANESTHESIA N/A 02/25/2018   Procedure: STENT PLACEMENT;  Surgeon: Devin Bras, MD;  Location: [place];  Service: Radiology;  Laterality: N/A;  . UMBILICAL HERNIA REPAIR N/A 11/13/2012   Procedure: UMBILICAL HERNIORRHAPHY;  Surgeon: Devin So, MD;   Location: [facility];  Service: General;  Laterality: N/A;  . VARICOCELECTOMY       reports that he has been smoking cigarettes. He started smoking about 34 years ago. He has a 13.00 pack-year smoking history. He quit smokeless tobacco use about 32 years ago.  His smokeless tobacco use included snuff and chew. He reports that he does not drink alcohol and does not use drugs.  Allergies  Allergen Reactions  . Crestor [Rosuvastatin] Other (See Comments)    rhabdomyolosis  . Trulicity [Dulaglutide] Nausea And Vomiting  . Bee Venom Swelling    SWELLING REACTION UNSPECIFIED     Family History  Problem Relation Age of Onset  . Coronary artery disease Father   . Hypertension Father   . Hyperlipidemia Father   . Diabetes Father   . Congestive Heart Failure Father 29  . Arrhythmia Father        had an ICD  . Diabetes Mother   . Hypertension Mother   . Hyperlipidemia Mother   . Obesity Mother 75       died after bariatric surgery, liver failure and infection  . Coronary artery disease Maternal Grandfather        both grandfathers and several uncles  . Coronary artery disease Other   . Diabetes Sister        both sisters  . Stroke Maternal Aunt   . Stroke Maternal Uncle      Prior to Admission medications   Medication Sig Start Date End Date Taking? Authorizing Provider  amLODipine (NORVASC) 5 MG tablet Take 1 tablet (5 mg total) by mouth daily. 10/07/18 07/28/19  Devin Burn, PA-C  aspirin 81 MG EC tablet Take 1 tablet (81 mg total) by mouth daily. With food 12/05/17   Devin Hockey, MD  benzonatate (TESSALON) 100 MG capsule Take 2 capsules (200 mg total) by mouth 3 (three) times daily as needed. 01/23/19   Devin Jefferson, PA-C  Blood Glucose Monitoring Suppl w/Device KIT Dispense based on patient and insurance preference. Check blood sugar daily before breakfast (ICD9 250.0) 04/29/17   Devin [name], Devin Allegra, MD  clopidogrel (PLAVIX) 75 MG tablet Take 75 mg by mouth daily.     [provider]  ezetimibe (ZETIA) 10 MG tablet Take 1 tablet (10 mg total) by mouth daily. 12/06/17   Devin Hockey, MD  famotidine (PEPCID) 20 MG tablet Take 20 mg by mouth 2 (two) times daily.    [provider]  fenofibrate (TRICOR) 145 MG tablet Take 145 mg by mouth daily.    [provider]  glimepiride (AMARYL) 2 MG tablet Take 1 tablet (2 mg total) by mouth daily with breakfast. 11/16/17   Patrecia Pour, MD  Levothyroxine Sodium (EUTHYROX PO) Take 300 mg by mouth.    [provider]  losartan (COZAAR) 25 MG tablet Take 0.5 tablets (12.5 mg total) by mouth daily. 07/28/19 10/26/19  Arnoldo Lenis, MD  nitroGLYCERIN (NITROSTAT) 0.4 MG SL tablet Place 1 tablet (0.4 mg total) under the tongue every 5 (five) minutes as needed for chest pain. 01/15/18   Strader, Fransisco Hertz, PA-C  oxyCODONE-acetaminophen (PERCOCET/ROXICET) 5-325 MG tablet Take 1 tablet by mouth every 4 (four) hours as needed for severe pain. 05/15/19   Devin Jefferson, PA-C  sitaGLIPtin (JANUVIA) 100 MG tablet Take 100 mg by mouth daily.    [provider]    Physical Exam: Vitals:   10/15/19 0333 10/15/19 0400 10/15/19 0430 10/15/19 0500  BP: (!) 141/76 127/79 111/70 110/68  Pulse:  (!) 51 (!) 55 (!) 58  Resp:      Temp:      TempSrc:      SpO2:  94% 95% 94%  Weight:      Height:       Constitutional: NAD, calm, comfortable Eyes: PERRL, lids and conjunctivae normal ENMT: Mucous membranes are moist. Posterior pharynx clear of any exudate or lesions.Normal dentition.  Neck: normal, supple, no masses, no thyromegaly Respiratory: clear to auscultation bilaterally, no wheezing, no crackles. Normal respiratory effort. No accessory muscle use.  Cardiovascular: Regular rate and rhythm, no murmurs / rubs / gallops. No extremity edema. 2+ pedal pulses. No carotid bruits.  Abdomen: no tenderness, no masses palpated. No hepatosplenomegaly. Bowel sounds positive.  Musculoskeletal: no  clubbing / cyanosis. No joint deformity upper and lower extremities. Good ROM, no contractures. Normal muscle tone.  Skin: no rashes, lesions, ulcers. No induration Neurologic: CN 2-12 grossly intact. Sensation intact, DTR normal. Strength 5/5 in all 4.  Psychiatric: Normal judgment and insight. Alert and oriented x 3. Normal mood.   Labs on Admission: I have personally reviewed following labs and imaging studies  CBC: Recent Labs  Lab 10/15/19 0046  WBC 10.7*  HGB 16.3  HCT 49.8  MCV 90.2  PLT 638   Basic Metabolic Panel: Recent Labs  Lab 10/15/19 0046  NA 131*  K 3.7  CL 93*  CO2 26  GLUCOSE 380*  BUN 14  CREATININE 1.11  CALCIUM 10.1   GFR: Estimated Creatinine Clearance: 118.7 mL/min (by C-G formula based on SCr of 1.11 mg/dL). Liver Function Tests: No results for input(s): AST, ALT, ALKPHOS, BILITOT, PROT, ALBUMIN in the last 168 hours. No results for input(s): LIPASE, AMYLASE in the last 168 hours. No results for input(s): AMMONIA in the last 168 hours. Coagulation Profile: No results for input(s): INR, PROTIME in the last 168 hours. Cardiac Enzymes: No results for input(s): CKTOTAL, CKMB, CKMBINDEX, TROPONINI in the last 168 hours. BNP (last 3 results) No results for input(s): PROBNP in the last 8760 hours. HbA1C: No results for input(s): HGBA1C in the last 72 hours. CBG: No results for input(s): GLUCAP in the last 168 hours. Lipid Profile: No results for input(s): CHOL, HDL, LDLCALC, TRIG, CHOLHDL, LDLDIRECT in the last 72 hours. Thyroid Function Tests: No results for input(s): TSH, T4TOTAL, FREET4, T3FREE, THYROIDAB in the last 72 hours. Anemia Panel: No results for input(s): VITAMINB12, FOLATE, FERRITIN, TIBC, IRON, RETICCTPCT in the last 72 hours. Urine analysis:    Component Value Date/Time   COLORURINE YELLOW 05/15/2019 1658   APPEARANCEUR CLEAR 05/15/2019 1658   LABSPEC 1.025 05/15/2019 1658   PHURINE 5.0 05/15/2019 1658   GLUCOSEU >=500 (A)  05/15/2019 1658   HGBUR MODERATE (A) 05/15/2019 1658   BILIRUBINUR  NEGATIVE 05/15/2019 [place] 05/15/2019 1658   PROTEINUR 30 (A) 05/15/2019 1658   UROBILINOGEN 0.2 10/06/2013 0305   NITRITE NEGATIVE 05/15/2019 1658   LEUKOCYTESUR NEGATIVE 05/15/2019 1658    Radiological Exams on Admission: CT Angio Head W or Wo Contrast  Result Date: 10/15/2019 CLINICAL DATA:  Chest pain with left arm and neck pain. EXAM: CT ANGIOGRAPHY HEAD AND NECK TECHNIQUE: Multidetector CT imaging of the head and neck was performed using the standard protocol during bolus administration of intravenous contrast. Multiplanar CT image reconstructions and MIPs were obtained to evaluate the vascular anatomy. Carotid stenosis measurements (when applicable) are obtained utilizing NASCET criteria, using the distal internal carotid diameter as the denominator. CONTRAST:  53m OMNIPAQUE IOHEXOL 350 MG/ML SOLN COMPARISON:  CTA head neck 07/27/2018 FINDINGS: CTA NECK FINDINGS SKELETON: There is no bony spinal canal stenosis. No lytic or blastic lesion. OTHER NECK: Normal pharynx, larynx and major salivary glands. No cervical lymphadenopathy. Unremarkable thyroid gland. UPPER CHEST: No pneumothorax or pleural effusion. No nodules or masses. AORTIC ARCH: There is no calcific atherosclerosis of the aortic arch. There is no aneurysm, dissection or hemodynamically significant stenosis of the visualized portion of the aorta. Conventional 3 vessel aortic branching pattern. The visualized proximal subclavian arteries are widely patent. RIGHT CAROTID SYSTEM: Normal without aneurysm, dissection or stenosis. LEFT CAROTID SYSTEM: Normal without aneurysm, dissection or stenosis. VERTEBRAL ARTERIES: Codominant configuration. Both origins are clearly patent. There is no dissection, occlusion or flow-limiting stenosis to the skull base (V1-V3 segments). CTA HEAD FINDINGS POSTERIOR CIRCULATION: --Vertebral arteries: Unchanged occlusion of the  right V4 segment. Stent marker in the distal left V4 segment, extending to the distal basilar artery. The stent is patent. --Inferior cerebellar arteries: Normal. --Basilar artery: Normal. --Superior cerebellar arteries: Normal. --Posterior cerebral arteries (PCA): Normal. ANTERIOR CIRCULATION: --Intracranial internal carotid arteries: Normal. --Anterior cerebral arteries (ACA): Normal. Both A1 segments are present. Patent anterior communicating artery (a-comm). --Middle cerebral arteries (MCA): Normal. VENOUS SINUSES: As permitted by contrast timing, patent. ANATOMIC VARIANTS: None Review of the MIP images confirms the above findings. IMPRESSION: 1. No emergent large vessel occlusion. 2. Patent stent extending from the left vertebral artery V4 segment to the distal basilar artery. 3. 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 4. Aortic Atherosclerosis (ICD10-I70.0). Electronically Signed   By: KUlyses JarredM.D.   On: 10/15/2019 03:10   CT Angio Neck W and/or Wo Contrast  Result Date: 10/15/2019 CLINICAL DATA:  Chest pain with left arm and neck pain. EXAM: CT ANGIOGRAPHY HEAD AND NECK TECHNIQUE: Multidetector CT imaging of the head and neck was performed using the standard protocol during bolus administration of intravenous contrast. Multiplanar CT image reconstructions and MIPs were obtained to evaluate the vascular anatomy. Carotid stenosis measurements (when applicable) are obtained utilizing NASCET criteria, using the distal internal carotid diameter as the denominator. CONTRAST:  769mOMNIPAQUE IOHEXOL 350 MG/ML SOLN COMPARISON:  CTA head neck 07/27/2018 FINDINGS: CTA NECK FINDINGS SKELETON: There is no bony spinal canal stenosis. No lytic or blastic lesion. OTHER NECK: Normal pharynx, larynx and major salivary glands. No cervical lymphadenopathy. Unremarkable thyroid gland. UPPER CHEST: No pneumothorax or pleural effusion. No nodules or masses. AORTIC ARCH: There is no calcific atherosclerosis of  the aortic arch. There is no aneurysm, dissection or hemodynamically significant stenosis of the visualized portion of the aorta. Conventional 3 vessel aortic branching pattern. The visualized proximal subclavian arteries are widely patent. RIGHT CAROTID SYSTEM: Normal without aneurysm, dissection or stenosis. LEFT CAROTID  SYSTEM: Normal without aneurysm, dissection or stenosis. VERTEBRAL ARTERIES: Codominant configuration. Both origins are clearly patent. There is no dissection, occlusion or flow-limiting stenosis to the skull base (V1-V3 segments). CTA HEAD FINDINGS POSTERIOR CIRCULATION: --Vertebral arteries: Unchanged occlusion of the right V4 segment. Stent marker in the distal left V4 segment, extending to the distal basilar artery. The stent is patent. --Inferior cerebellar arteries: Normal. --Basilar artery: Normal. --Superior cerebellar arteries: Normal. --Posterior cerebral arteries (PCA): Normal. ANTERIOR CIRCULATION: --Intracranial internal carotid arteries: Normal. --Anterior cerebral arteries (ACA): Normal. Both A1 segments are present. Patent anterior communicating artery (a-comm). --Middle cerebral arteries (MCA): Normal. VENOUS SINUSES: As permitted by contrast timing, patent. ANATOMIC VARIANTS: None Review of the MIP images confirms the above findings. IMPRESSION: 1. No emergent large vessel occlusion. 2. Patent stent extending from the left vertebral artery V4 segment to the distal basilar artery. 3. 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 4. Aortic Atherosclerosis (ICD10-I70.0). Electronically Signed   By: Ulyses Jarred M.D.   On: 10/15/2019 03:10   DG Chest Portable 1 View  Result Date: 10/15/2019 CLINICAL DATA:  Left chest pain, left arm pain, and neck pain for 3 days. History of asthma, coronary artery disease, hypertension, diabetes. Current smoker. EXAM: PORTABLE CHEST 1 VIEW COMPARISON:  01/23/2019 FINDINGS: Normal heart size and pulmonary vascularity. Diffuse reticulonodular  interstitial pattern, new since prior study. This could represent fibrosis or interstitial pneumonitis. Possible bronchiectasis also. No focal consolidation. No pleural effusions. No pneumothorax. Mediastinal contours appear intact. IMPRESSION: Diffuse reticulonodular interstitial pattern to the lungs, new since prior study. Electronically Signed   By: Lucienne Capers M.D.   On: 10/15/2019 00:45   Assessment/Plan Principal Problem:   Atypical chest pain Active Problems:   Tobacco abuse   Hypertension   Hypothyroidism   Type 2 diabetes mellitus without complications (HCC)   CKD (chronic kidney disease)   Chest pain with moderate risk for cardiac etiology   Abnormal cardiac CT angiography   Coronary artery disease   Chronic combined systolic and diastolic CHF (congestive heart failure) (HCC)   Basilar artery stenosis, symptomatic, without infarction   Vertebral basilar insufficiency   Ischemic cardiomyopathy  1. Atypical chest pain - Pt has had serial high sensitivity troponin tests that have been negative with no significant delta. Normal telemetry monitoring seen. Pt has been seen by cardiology team and had a 2D echocardiogram done that shows improvement in his EF.  Dr. Harl Bowie saw him and patient is safe to discharge home.  His main problem is his poorly controlled chronic medical conditions due to not taking his meds.  He was given new prescriptions for all of his regular home meds and strongly advised to follow up with health department.   2. Poorly controlled type 2 DM - ambulatory referral made to endocrinology and outpatient diabetes eduction. Refilled his home meds.  3. Hypothyroidism -  He admits not taking his thyroid medications, refills given, endocrinology referral.  4. Dyslipidemia - poorly controlled, treat diabetes and resume home lipid lowering agents.  5. CAD - resume home plavix and aspirin per cardiology team recommendations.  6. Chronic combined systolic and diastolic CHF  - echo today shows improvement in EF.  7. Severe vitamin D deficiency - Drisdol 50,000 IU caps prescribed.   Irwin Brakeman MD [facility] How to contact the [facility] Attending or Consulting provider [place] or covering provider during after hours [place], for this patient?  1. Check the care team in [facility] and look for  a) attending/consulting [place] provider listed and b) the [facility] team listed 2. Log into [email or web address] and use [place]'s universal password to access. If you do not have the password, please contact the hospital operator. 3. Locate the [facility] provider you are looking for under [facility] and page to a number that you can be directly reached. 4. If you still have difficulty reaching the provider, please page the [facility] (Director on Call) for the Hospitalists listed on amion for assistance.   If 7PM-7AM, please contact night-coverage [email or web address] Password [facility]  10/15/2019, 7:18 AM

## 2019-10-15 NOTE — Consult Note (Addendum)
Cardiology Consult    Patient ID: Devin [name]; [id number]; March 04, 1971   Admit date: 10/15/2019 Date of Consult: 10/15/2019  Primary Care Provider: Health, [facility] Primary Cardiologist: Carlyle Dolly, MD   Patient Profile    Devin [name] is a 49 y.o. male with past medical history of CAD (s/p DES x2 to LAD and DES to OM in 11/2017 with subtotally occluded nondominant RCA and distal Koran Seabrook disease with medical management recommended, low-risk NST in 10/2018), ischemic cardiomyopathy (EF 35-40% by cath in 11/2017, similar by repeat echo in 01/2018), Type 2 DM, HTN, HLD, Hypothyroidism and history of CVA (s/p cerebral stenting) who is being seen today for the evaluation of chest pain at the request of Dr. Wynetta Emery.   History of Present Illness    He was last examined by Dr. Harl Bowie in 07/2019 and denied any recent chest pain at that time. Medical therapy for his cardiomyopathy was limited as he experienced bradycardia with BB therapy in the past and had hypotension and dizziness with Entresto. Previously had rhabdo on Crestor, therefore was on Zetia and Fenofibrate. He was restarted on Losartan 12.67m daily while being continued on Amlodipine, ASA, Plavix, Zetia, and Fenofibrate.  [facility] evening for evaluation of chest pain for the past 3 days. Reports being without medications for the past month.  In talking with the patient today, he reports a numbness and tingling sensation which starts along his left pectoral region and radiates up to his neck and down his arm. This can occur at rest or with activity and lasts for several hours at a time. He reports that he sometimes feels like something is "bursting" in his chest as well. He denies any specific exertional symptoms.  He does have dyspnea on exertion if working outside in the heat but denies any recent orthopnea, PND or lower extremity edema.  He has been without his medications for a month  due to no longer going to the health department as he plans to [place] care [place]  Initially says he has been without all medications but later says he was taking ASA and Plavix.  Initial labs show WBC 10.7, Hgb 16.3, platelets 193, Na+ 131, K+ 3.7, glucose 380 and creatinine 1.11.  Initial and delta troponin values have been negative at 6.  COVID negative. TSH 83.261 with Free T4 pending. FLP shows total cholesterol of 709, triglycerides 2420 and HDL 36.  CXR shows diffuse reticulonodular interstitial pattern to the lungs.  CTA head shows a patent stent extending from the left vertebral artery to the distal basilic artery and unchanged occlusion of the distal right V4 segment with no emergent large vessel occlusion. EKG shows NSR, HR 67 with LVH and TWI along the inferior and lateral leads which is similar to prior tracings.   Past Medical History:  Diagnosis Date  . Asthma   . Chest pain   . [place]   . Coronary artery disease    a. s/p DES x2 to LAD and DES to OM in 11/2017  . Depression   . Dyspnea   . GERD (gastroesophageal reflux disease)   . Headache   . History of kidney stones   . History of rhabdomyolysis   . Hyperlipidemia   . Hypertension   . Hypothyroidism   . Ischemic cardiomyopathy    a. 11/2017: echo showing EF of 35-40%, diffuse HK, and Grade 2 DD  .  Kidney calculus 2014  . Pericardial effusion    Small, by dobutamine echocardiogram, 04/2006  . PONV (postoperative nausea and vomiting)   . Sleep apnea    does not wear cpap  . Stroke ([place])   . Tobacco abuse   . Type 2 diabetes mellitus without complications ([place]) 1/61/0960  . Vitamin D deficiency     Past Surgical History:  Procedure Laterality Date  . APPENDECTOMY    . 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 CHOLECYSTECTOMY N/A 01/27/2013   Procedure: LAPAROSCOPIC CHOLECYSTECTOMY;   Surgeon: Jamesetta So, MD;  Location: [facility];  Service: General;  Laterality: N/A;  . CORONARY STENT INTERVENTION N/A 11/15/2017   Procedure: CORONARY STENT INTERVENTION;  Surgeon: Leonie Man, MD;  Location: [place] [facility];  Service: Cardiovascular;  Laterality: N/A;  . HERNIA REPAIR     As a child  . INSERTION OF MESH N/A 11/13/2012   Procedure: INSERTION OF MESH;  Surgeon: Jamesetta So, MD;  Location: [facility];  Service: General;  Laterality: N/A;  . IR ANGIO INTRA EXTRACRAN SEL COM CAROTID INNOMINATE BILAT MOD SED  12/01/2017  . IR ANGIO INTRA EXTRACRAN SEL COM CAROTID INNOMINATE BILAT MOD SED  02/13/2018  . IR ANGIO VERTEBRAL SEL SUBCLAVIAN INNOMINATE UNI L MOD SED  02/13/2018  . IR ANGIO VERTEBRAL SEL VERTEBRAL BILAT MOD SED  12/01/2017  . IR ANGIO VERTEBRAL SEL VERTEBRAL UNI R MOD SED  02/13/2018  . IR INTRA CRAN STENT  12/03/2017  . IR PTA INTRACRANIAL  02/25/2018  . IR US GUIDE VASC ACCESS RIGHT  02/13/2018  . KIDNEY STONE SURGERY    . LEFT HEART CATH AND CORONARY ANGIOGRAPHY N/A 11/15/2017   Procedure: LEFT HEART CATH AND CORONARY ANGIOGRAPHY;  Surgeon: Leonie Man, MD;  Location: [place] [facility];  Service: Cardiovascular;  Laterality: N/A;  . PERCUTANEOUS NEPHROLITHOTRIPSY    . RADIOLOGY WITH ANESTHESIA Left 12/03/2017   Procedure: Angioplasty with possible stenting of left VBJ;  Surgeon: Luanne Bras, MD;  Location: [place];  Service: Radiology;  Laterality: Left;  . RADIOLOGY WITH ANESTHESIA N/A 02/25/2018   Procedure: STENT PLACEMENT;  Surgeon: Luanne Bras, MD;  Location: [place];  Service: Radiology;  Laterality: N/A;  . UMBILICAL HERNIA REPAIR N/A 11/13/2012   Procedure: UMBILICAL HERNIORRHAPHY;  Surgeon: Jamesetta So, MD;  Location: [facility];  Service: General;  Laterality: N/A;  . VARICOCELECTOMY       Home Medications:  Prior to Admission medications   Medication Sig Start Date End Date Taking? Authorizing Provider  amLODipine (NORVASC) 5 MG tablet Take 1  tablet (5 mg total) by mouth daily. 10/07/18 10/15/19 Yes Imogene Burn, PA-C  aspirin 81 MG EC tablet Take 1 tablet (81 mg total) by mouth daily. With food 12/05/17  Yes Roxan Hockey, MD  Blood Glucose Monitoring Suppl w/Device KIT Dispense based on patient and insurance preference. Check blood sugar daily before breakfast (ICD9 250.0) 04/29/17  Yes Pollina, Gwenyth Allegra, MD  clopidogrel (PLAVIX) 75 MG tablet Take 75 mg by mouth daily.   Yes [provider]  DULERA 200-5 MCG/ACT AERO Inhale 2 puffs into the lungs 2 (two) times daily. 06/11/19  Yes [provider]  EUTHYROX 100 MCG tablet Take 100 mcg by mouth daily. 05/18/19  Yes [provider]  ezetimibe (ZETIA) 10 MG tablet Take 1 tablet (10 mg total) by mouth daily. 12/06/17  Yes Emokpae, Courage, MD  famotidine (PEPCID) 20 MG tablet Take 20 mg by mouth 2 (two) times daily.   Yes [provider]  fenofibrate (TRICOR) 145 MG tablet Take 145 mg by mouth daily.   Yes [provider]  glimepiride (AMARYL) 2 MG tablet Take 1 tablet (2 mg total) by mouth daily with breakfast. 11/16/17  Yes Patrecia Pour, MD  losartan (COZAAR) 25 MG tablet Take 0.5 tablets (12.5 mg total) by mouth daily. 07/28/19 10/26/19 Yes Kataleyah Carducci, Alphonse Guild, MD  nitroGLYCERIN (NITROSTAT) 0.4 MG SL tablet Place 1 tablet (0.4 mg total) under the tongue every 5 (five) minutes as needed for chest pain. 01/15/18  Yes Strader, [place], PA-C  sitaGLIPtin (JANUVIA) 100 MG tablet Take 100 mg by mouth daily.   Yes [provider]  benzonatate (TESSALON) 100 MG capsule Take 2 capsules (200 mg total) by mouth 3 (three) times daily as needed. Patient not taking: Reported on 10/15/2019 01/23/19   Evalee Jefferson, PA-C  oxyCODONE-acetaminophen (PERCOCET/ROXICET) 5-325 MG tablet Take 1 tablet by mouth every 4 (four) hours as needed for severe pain. Patient not taking: Reported on 10/15/2019 05/15/19   Evalee Jefferson, PA-C    Inpatient  Medications: Scheduled Meds: . aspirin EC  81 mg Oral Daily  . clopidogrel  75 mg Oral Daily  . enoxaparin (LOVENOX) injection  40 mg Subcutaneous Q24H  . ezetimibe  10 mg Oral Daily  . fenofibrate  160 mg Oral Daily  . losartan  12.5 mg Oral Daily   Continuous Infusions:  PRN Meds:   Allergies:    Allergies  Allergen Reactions  . Crestor [Rosuvastatin] Other (See Comments)    rhabdomyolosis  . Trulicity [Dulaglutide] Nausea And Vomiting  . Bee Venom Swelling    SWELLING REACTION UNSPECIFIED     Social History:   Social History   Socioeconomic History  . Marital status: Divorced    Spouse name: Not on file  . Number of children: Not on file  . Years of education: Not on file  . Highest education level: Not on file  Occupational History    Comment:      Employer: [employer]  Tobacco Use  . Smoking status: Current Every Day Smoker    Packs/day: 0.50    Years: 26.00    Pack years: 13.00    Types: Cigarettes    Start date: 03/13/1985  . Smokeless tobacco: Former Systems developer    Types: Snuff, Sarina Ser    Quit date: 03/14/1987  Vaping Use  . Vaping Use: Former  . Devices: quit in 2011  Substance and Sexual Activity  . Alcohol use: No  . Drug use: No  . Sexual activity: Not on file  Other Topics Concern  . Not on file  Social History Narrative   Eats fast food daily   No exercise outside of work   Lives in [place] with his dog   MAINTENANCE TECHNICIAN   Social Determinants of Health   Financial Resource Strain:   . Difficulty of Paying Living Expenses:   Food Insecurity:   . Worried About Charity fundraiser in the Last Year:   . Arboriculturist in the Last Year:   Transportation Needs:   . Film/video editor (Medical):   Marland Kitchen Lack of Transportation (Non-Medical):   Physical Activity:   . Days of Exercise per Week:   . Minutes of Exercise per Session:   Stress:   . Feeling of Stress :   Social  Connections:   . Frequency of Communication with Friends and Family:    . Frequency of Social Gatherings with Friends and Family:   . Attends Religious Services:   . Active Member of Clubs or Organizations:   . Attends Archivist Meetings:   Marland Kitchen Marital Status:   Intimate Partner Violence:   . Fear of Current or Ex-Partner:   . Emotionally Abused:   Marland Kitchen Physically Abused:   . Sexually Abused:      Family History:    Family History  Problem Relation Age of Onset  . Coronary artery disease Father   . Hypertension Father   . Hyperlipidemia Father   . Diabetes Father   . Congestive Heart Failure Father 62  . Arrhythmia Father        had an ICD  . Diabetes Mother   . Hypertension Mother   . Hyperlipidemia Mother   . Obesity Mother 64       died after bariatric surgery, liver failure and infection  . Coronary artery disease Maternal Grandfather        both grandfathers and several uncles  . Coronary artery disease Other   . Diabetes Sister        both sisters  . Stroke Maternal Aunt   . Stroke Maternal Uncle       Review of Systems    General:  No chills, fever, night sweats or weight changes.  Cardiovascular:  No dyspnea on exertion, edema, orthopnea, palpitations, paroxysmal nocturnal dyspnea. Positive for chest pain.  Dermatological: No rash, lesions/masses Respiratory: No cough, dyspnea Urologic: No hematuria, dysuria Abdominal:   No nausea, vomiting, diarrhea, bright red blood per rectum, melena, or hematemesis Neurologic:  No visual changes, wkns, changes in mental status. Positive for paraesthesias.   All other systems reviewed and are otherwise negative except as noted above.  Physical Exam/Data    Vitals:   10/15/19 0802 10/15/19 0832 10/15/19 0902 10/15/19 0930  BP: 124/78 122/73 107/66 118/68  Pulse: 61 (!) 54 (!) 51 (!) 56  Resp: 18   16  Temp:      TempSrc:      SpO2: 91% 94% 92% 94%  Weight:      Height:        Intake/Output Summary (Last 24 hours) at 10/15/2019 1015 Last data filed at 10/15/2019 0756 Gross  per 24 hour  Intake --  Output 800 ml  Net -800 ml   Filed Weights   10/14/19 2234  Weight: 117.9 kg   Body mass index is 30.05 kg/m.   General: Pleasant male appearing in NAD Psych: Normal affect. Neuro: Alert and oriented X 3. Moves all extremities spontaneously. HEENT: Normal  Neck: Supple without bruits or JVD. Lungs:  Resp regular and unlabored, CTA without wheezing or rales. Heart: RRR no s3, s4, or murmurs. Abdomen: Soft, non-tender, non-distended, BS + x 4.  Extremities: No clubbing, cyanosis or edema. DP/PT/Radials 2+ and equal bilaterally.   EKG:  The EKG was personally reviewed and demonstrates:NSR, HR 67 with LVH and TWI along the inferior and lateral leads which is similar to prior tracings.   Labs/Studies     Relevant CV Studies:  Cardiac Catheterization: 11/2017  There is moderate left ventricular systolic dysfunction. The left ventricular ejection fraction is 35-45% by visual estimate.  LV end diastolic pressure is mildly elevated.  -----------------------------------------  ANGIOGRAPHY- PCI  LESION #1: Mid LAD lesion is 80% stenosed.  A drug-eluting stent was successfully placed using a STENT  SYNERGY DES 2.25X16. -Postdilated to 2.4 mm  Post intervention, there is a 0% residual stenosis.  LESION #2: mid LAD to Dist LAD lesion is 90% stenosed.  A drug-eluting stent was successfully placed using a STENT SYNERGY DES 2.25X12. -Postdilated to 2.4 mm  Post intervention, there is a 0% residual stenosis.  Prox LAD lesion is 40% stenosed -prior to lesion 1; apical, after lesion 2  A drug-eluting stent was successfully placed using a STENT SYNERGY DES 2.25X12. -Postdilated to 2.4 mm  -----------------------------------------  Post intervention, there is a 0% residual stenosis.  Trifurcation 90% lesion at the distal circumflex into LPL and PDA -small caliber distal vessels. Best treated medically as this is not a very good PCI target  Prox small  caliber nondominant RCA lesion is 99% stenosed.   Severe distal OM 2 -DES PCI with Synergy 2.25 mm x16 mm (2.4 mm); severe trifurcation disease with a very distal AV groove circumflex prior to PDA and PL Ellesse Antenucci -medical management.   Subtotally occluded nondominant RCA Moderately reduced LVEF of roughly 40% with mildly elevated LVEDP.  Plan: Transfer to postprocedure unit for post PCI monitoring and TR removal Continue aggressive risk factor modification with statin and beta-blocker etc.  Recommend uninterrupted dual antiplatelet therapy with Aspirin 54m daily and Ticagrelor 948mtwice daily for a minimum of 12 months (ACS - Class I recommendation). -Would strongly consider reducing to 60 mg dose ticagrelor at 9-12 months and continue for an additional year.   Would be okay to stop aspirin after 3 months if necessary.  Okay to stop Brilinta after 6 months for procedures if necessary.   Echocardiogram: 01/2018 Study Conclusions   - Left ventricle: The cavity size was normal. There was mild  concentric hypertrophy. Systolic function was moderately reduced.  The estimated ejection fraction was in the range of 35% to 40%.  Diffuse hypokinesis. Doppler parameters are consistent with  abnormal left ventricular relaxation (grade 1 diastolic  dysfunction).  - Mitral valve: There was mild regurgitation.  - Pericardium, extracardiac: A trivial pericardial effusion was  identified along the right ventricular free wall.   Impressions:   - Compared to the prior study, there has been no significant  interval change.    NST: 10/2018  There was no ST segment deviation noted during stress.  Defect 1: There is a small defect of mild severity present in the mid inferoseptal location. This is likely due to soft tissue attenuation artifact.  This is a low risk study. No ischemic zones.  Nuclear stress EF: 46%.  Laboratory Data:  Chemistry Recent Labs  Lab 10/15/19 0046  10/15/19 0728  NA 131*  --   K 3.7  --   CL 93*  --   CO2 26  --   GLUCOSE 380*  --   BUN 14  --   CREATININE 1.11 1.10  CALCIUM 10.1  --   GFRNONAA >60 >60  GFRAA >60 >60  ANIONGAP 12  --     No results for input(s): PROT, ALBUMIN, AST, ALT, ALKPHOS, BILITOT in the last 168 hours. Hematology Recent Labs  Lab 10/15/19 0046 10/15/19 0728  WBC 10.7* 11.7*  RBC 5.52 5.65  HGB 16.3 16.2  HCT 49.8 50.2  MCV 90.2 88.8  MCH 29.5 28.7  MCHC 32.7 32.3  RDW 16.6* 16.1*  PLT 193 216   Cardiac EnzymesNo results for input(s): TROPONINI in the last 168 hours. No results for input(s): TROPIPOC in the last 168 hours.  BNP Recent Labs  Lab 10/15/19 0728  BNP 42.0    DDimer No results for input(s): DDIMER in the last 168 hours.  Radiology/Studies:  CT Angio Head W or Wo Contrast  Result Date: 10/15/2019 CLINICAL DATA:  Chest pain with left arm and neck pain. EXAM: CT ANGIOGRAPHY HEAD AND NECK TECHNIQUE: Multidetector CT imaging of the head and neck was performed using the standard protocol during bolus administration of intravenous contrast. Multiplanar CT image reconstructions and MIPs were obtained to evaluate the vascular anatomy. Carotid stenosis measurements (when applicable) are obtained utilizing NASCET criteria, using the distal internal carotid diameter as the denominator. CONTRAST:  8m OMNIPAQUE IOHEXOL 350 MG/ML SOLN COMPARISON:  CTA head neck 07/27/2018 FINDINGS: CTA NECK FINDINGS SKELETON: There is no bony spinal canal stenosis. No lytic or blastic lesion. OTHER NECK: Normal pharynx, larynx and major salivary glands. No cervical lymphadenopathy. Unremarkable thyroid gland. UPPER CHEST: No pneumothorax or pleural effusion. No nodules or masses. AORTIC ARCH: There is no calcific atherosclerosis of the aortic arch. There is no aneurysm, dissection or hemodynamically significant stenosis of the visualized portion of the aorta. Conventional 3 vessel aortic branching pattern. The  visualized proximal subclavian arteries are widely patent. RIGHT CAROTID SYSTEM: Normal without aneurysm, dissection or stenosis. LEFT CAROTID SYSTEM: Normal without aneurysm, dissection or stenosis. VERTEBRAL ARTERIES: Codominant configuration. Both origins are clearly patent. There is no dissection, occlusion or flow-limiting stenosis to the skull base (V1-V3 segments). CTA HEAD FINDINGS POSTERIOR CIRCULATION: --Vertebral arteries: Unchanged occlusion of the right V4 segment. Stent marker in the distal left V4 segment, extending to the distal basilar artery. The stent is patent. --Inferior cerebellar arteries: Normal. --Basilar artery: Normal. --Superior cerebellar arteries: Normal. --Posterior cerebral arteries (PCA): Normal. ANTERIOR CIRCULATION: --Intracranial internal carotid arteries: Normal. --Anterior cerebral arteries (ACA): Normal. Both A1 segments are present. Patent anterior communicating artery (a-comm). --Middle cerebral arteries (MCA): Normal. VENOUS SINUSES: As permitted by contrast timing, patent. ANATOMIC VARIANTS: None Review of the MIP images confirms the above findings. IMPRESSION: 1. No emergent large vessel occlusion. 2. Patent stent extending from the left vertebral artery V4 segment to the distal basilar artery. 3. 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 4. Aortic Atherosclerosis (ICD10-I70.0). Electronically Signed   By: KUlyses JarredM.D.   On: 10/15/2019 03:10   CT Angio Neck W and/or Wo Contrast  Result Date: 10/15/2019 CLINICAL DATA:  Chest pain with left arm and neck pain. EXAM: CT ANGIOGRAPHY HEAD AND NECK TECHNIQUE: Multidetector CT imaging of the head and neck was performed using the standard protocol during bolus administration of intravenous contrast. Multiplanar CT image reconstructions and MIPs were obtained to evaluate the vascular anatomy. Carotid stenosis measurements (when applicable) are obtained utilizing NASCET criteria, using the distal internal carotid  diameter as the denominator. CONTRAST:  763mOMNIPAQUE IOHEXOL 350 MG/ML SOLN COMPARISON:  CTA head neck 07/27/2018 FINDINGS: CTA NECK FINDINGS SKELETON: There is no bony spinal canal stenosis. No lytic or blastic lesion. OTHER NECK: Normal pharynx, larynx and major salivary glands. No cervical lymphadenopathy. Unremarkable thyroid gland. UPPER CHEST: No pneumothorax or pleural effusion. No nodules or masses. AORTIC ARCH: There is no calcific atherosclerosis of the aortic arch. There is no aneurysm, dissection or hemodynamically significant stenosis of the visualized portion of the aorta. Conventional 3 vessel aortic branching pattern. The visualized proximal subclavian arteries are widely patent. RIGHT CAROTID SYSTEM: Normal without aneurysm, dissection or stenosis. LEFT CAROTID SYSTEM: Normal without aneurysm, dissection or stenosis. VERTEBRAL ARTERIES: Codominant configuration. Both origins are clearly patent. There is  no dissection, occlusion or flow-limiting stenosis to the skull base (V1-V3 segments). CTA HEAD FINDINGS POSTERIOR CIRCULATION: --Vertebral arteries: Unchanged occlusion of the right V4 segment. Stent marker in the distal left V4 segment, extending to the distal basilar artery. The stent is patent. --Inferior cerebellar arteries: Normal. --Basilar artery: Normal. --Superior cerebellar arteries: Normal. --Posterior cerebral arteries (PCA): Normal. ANTERIOR CIRCULATION: --Intracranial internal carotid arteries: Normal. --Anterior cerebral arteries (ACA): Normal. Both A1 segments are present. Patent anterior communicating artery (a-comm). --Middle cerebral arteries (MCA): Normal. VENOUS SINUSES: As permitted by contrast timing, patent. ANATOMIC VARIANTS: None Review of the MIP images confirms the above findings. IMPRESSION: 1. No emergent large vessel occlusion. 2. Patent stent extending from the left vertebral artery V4 segment to the distal basilar artery. 3. Unchanged occlusion of the distal right  V4 segment. 4. Aortic Atherosclerosis (ICD10-I70.0). Electronically Signed   By: Ulyses Jarred M.D.   On: 10/15/2019 03:10   DG Chest Portable 1 View  Result Date: 10/15/2019 CLINICAL DATA:  Left chest pain, left arm pain, and neck pain for 3 days. History of asthma, coronary artery disease, hypertension, diabetes. Current smoker. EXAM: PORTABLE CHEST 1 VIEW COMPARISON:  01/23/2019 FINDINGS: Normal heart size and pulmonary vascularity. Diffuse reticulonodular interstitial pattern, new since prior study. This could represent fibrosis or interstitial pneumonitis. Possible bronchiectasis also. No focal consolidation. No pleural effusions. No pneumothorax. Mediastinal contours appear intact. IMPRESSION: Diffuse reticulonodular interstitial pattern to the lungs, new since prior study. Electronically Signed   By: Lucienne Capers M.D.   On: 10/15/2019 00:45     Assessment & Plan    1.  Atypical Chest Pain -The reported episodes of chest pain seem atypical for a cardiac etiology as he reports a numbness and paresthesia sensation along his neck, chest and arms which radiates into his fourth and fifth digits. This can last for hours at a time and is not associated with exertion. - Initial and delta troponin values have been negative and his EKG is similar to prior tracings as this shows NSR, HR 67 with LVH and TWI along the inferior and lateral leads which is similar to prior tracings. Will plan to obtain an echocardiogram to assess LV function and wall motion. Given his recent medication noncompliance, would not anticipate an invasive evaluation at this time unless echo demonstrates significant changes.   2. CAD - He is s/p DES x2 to LAD and DES to OM in 11/2017 with subtotally occluded nondominant RCA and distal Arvel [place] disease with medical management recommended as outlined above. His most recent ischemic evaluation was a low-risk NST in 10/2018. - Will plan for repeat echocardiogram as outlined above. -  Continue ASA 81 mg daily, Plavix 75 mg daily, Zetia and Fenofibrate. He is not on a beta-blocker given baseline bradycardia and he previously had rhabdo with statin therapy in the past.  3. Chronic Systolic CHF - EF was previously 35-40% by cath in 11/2017, similar by repeat echo in 01/2018.  He does not appear volume overloaded by examination today.  Medical therapy has been limited in the setting of his bradycardia and hypotension with dizziness. - Will plan to restart Losartan 12.5 mg daily as he was previously tolerating this well. His current heart rate in the 50's which still limits the use of beta-blocker therapy. Pending BP trend, could consider the use of Spironolactone 12.5 mg daily prior to discharge if EF remains reduced.   4. HLD - FLP shows total cholesterol 709, triglycerides 2420 and HDL 36.  He  has been off Zetia and Fenofibrate for over a month. Will plan to restart.  He would benefit from referral to [facility] as an outpatient given prior rhabdomyolysis with statin therapy.  5. Hypothyroidism - TSH is significantly elevated at 83.261 with free T4 pending. Will defer Synthroid dosing to the admitting team.  For questions or updates, please contact [place] Please consult [email or web address] for contact info under Cardiology/STEMI.  Signed, Erma Heritage, PA-C 10/15/2019, 10:15 AM Pager: [phone]  Attending note Patient seen and discussed with PA Ahmed Prima, I agree with her documentation. 49 yo male history of CAD with DES x 2 to LAD and DES to OM in 11/2017 with residual disease treated medically as poor PCI targets. Chronic systolic HF LVEF 19-54%, HL, rhabdo on statins, prior CVA, COPD Presents with atypical chest pains, lasting several hours left sided chest into neck and arm with significant associated numbness.   Medical therapy limited due to prior rhabdo on crestor, bradycardia on beta blockers, low bp and dizziness on entresto.   WBC 10.7 Hgb 16.3 Plt 193 K  3.7 Cr 1.11 BNP 42 TSH 83 TGs 2420 HgbA1c 13 hstrop 6-->6--> COVID neg EKG SR, chronic inferior/lateral ST/T changes 10/2019 Echo LVEF 50-55%, no WMAs 10/2018 nuclear stress: no ischemia  Atypical chest pain without objective evidence of ischemis. His LVEF has actually imrpoved on todays echo and now back to normal. Should be on aspirin 81, plavix 75, fenofibrate 145, zetia, 10, losartan. The root of his severe TGs is his poorly controlled diabetes and severe hypothyroidism, onlly so much that TG specifici medications will do. I don't think could afford vascepa or lovaza, in general he has been nonadherent to medciations at home  No further cardiac workup, we will sign off inpatient care   Carlyle Dolly MD

## 2019-10-15 NOTE — Discharge Summary (Signed)
Physician Discharge Summary  Devin [name] PQM:[id number] DOB: Aug 18, 1970 DOA: 10/15/2019  PCP: Randell Patient [facility] Cardiology: W[facility]  Admit date: 10/15/2019 Discharge date: 10/15/2019  Admitted From:  Home  Disposition:  Home   Recommendations for Outpatient Follow-up:  1. Follow up with PCP in 1 weeks 2. Please follow up with cardiology in 3-4 weeks 3. Establish care with endocrinology clinic  4. Follow up with outpatient diabetes and nutrition education center  Discharge Condition: STABLE   CODE STATUS: DNR    Brief Hospitalization Summary: Please see all hospital notes, images, labs for full details of the hospitalization. HPI: Devin [name] is a 49 y.o. male with medical history significant of CAD, CHF, poor medical compliance, dysplipidemia, HTN, hypothyroidism and asthma and other medical history below presented to ED with 2 days of atypical chest pain symptoms. He admits that he has not been taking any of his medications for at least 1 month.  He says that he did not want to follow the instructions and direction of providers at the health department where he was receiving primary care.  He says he made arrangements for a PCP follow up at a new clinic in [place] for October 22, 2019.  He denies shortness of breath.  He denies fever.  He was placed in observation for serial troponin testing and cardiology consultation.    Pt was admitted for observation.  His HS troponin tests have been negative.  He was seen by cardiology and had a 2D echo with findings of improvement in his EF 50-55% with no WMAs.  No further cardiac work up was recommended. He needs to take his medications.  He was given new prescriptions. He was advised to follow up with his PCP clinic.  He verbalized understanding.  He was referred to endocrinology clinic and diabetes education.    Discharge Diagnoses:  Principal Problem:   Atypical chest pain Active Problems:   Tobacco abuse   Hypertension    Hypothyroidism   Type 2 diabetes mellitus without complications (HCC)   CKD (chronic kidney disease)   Chest pain with moderate risk for cardiac etiology   Abnormal cardiac CT angiography   Coronary artery disease   Chronic combined systolic and diastolic CHF (congestive heart failure) (HCC)   Basilar artery stenosis, symptomatic, without infarction   Vertebral basilar insufficiency   Ischemic cardiomyopathy   Discharge Instructions: Discharge Instructions    Ambulatory referral to Endocrinology   Complete by: As directed    Referral to Nutrition and Diabetes Services   Complete by: As directed    Choose type of Diabetes Self-Management Training (DSMT) training services and number of hours requested: Initial DSMT: 10 hours   Check all special needs that apply to patient requiring 1 on 1 DSMT: Low literacy   DSMT Content: Comprehensive self-management skills- All of the content areas   Choose the type of Medical Nutrition Therapy (MNT) and number of hours: Initial MNT: 3 hours   FOR MEDICARE PATIENTS: I hereby certify that I am managing this beneficiary's diabetes condition and that the above prescribed training is a necessary part of management.: Yes     Allergies as of 10/15/2019      Reactions   Crestor [rosuvastatin] Other (See Comments)   rhabdomyolosis   Trulicity [dulaglutide] Nausea And Vomiting   Bee Venom Swelling   SWELLING REACTION UNSPECIFIED       Medication List    STOP taking these medications   amLODipine 5 MG tablet Commonly known  as: NORVASC   benzonatate 100 MG capsule Commonly known as: TESSALON   oxyCODONE-acetaminophen 5-325 MG tablet Commonly known as: PERCOCET/ROXICET     TAKE these medications   aspirin 81 MG EC tablet Take 1 tablet (81 mg total) by mouth daily. With food   Blood Glucose Monitoring Suppl w/Device Kit Dispense based on patient and insurance preference. Check blood sugar daily before breakfast (ICD9 250.0)   clopidogrel 75 MG  tablet Commonly known as: PLAVIX Take 1 tablet (75 mg total) by mouth daily.   Dulera 200-5 MCG/ACT Aero Generic drug: mometasone-formoterol Inhale 2 puffs into the lungs 2 (two) times daily.   Euthyrox 100 MCG tablet Generic drug: levothyroxine Take 1 tablet (100 mcg total) by mouth daily.   ezetimibe 10 MG tablet Commonly known as: ZETIA Take 1 tablet (10 mg total) by mouth daily.   famotidine 20 MG tablet Commonly known as: PEPCID Take 20 mg by mouth 2 (two) times daily.   fenofibrate 145 MG tablet Commonly known as: TRICOR Take 1 tablet (145 mg total) by mouth daily.   glimepiride 2 MG tablet Commonly known as: AMARYL Take 1 tablet (2 mg total) by mouth daily with breakfast.   losartan 25 MG tablet Commonly known as: COZAAR Take 0.5 tablets (12.5 mg total) by mouth daily.   nitroGLYCERIN 0.4 MG SL tablet Commonly known as: NITROSTAT Place 1 tablet (0.4 mg total) under the tongue every 5 (five) minutes as needed for chest pain.   sitaGLIPtin 100 MG tablet Commonly known as: JANUVIA Take 1 tablet (100 mg total) by mouth daily.   Vitamin D (Ergocalciferol) 1.25 MG (50000 UNIT) Caps capsule Commonly known as: DRISDOL Take 1 capsule (50,000 Units total) by mouth every 7 (seven) days.       Follow-up Information    [facility], [facility]. Schedule an appointment as soon as possible for a visit in 1 week(s).   Contact information: [place] [id number] [phone]        Arnoldo Lenis, MD. Schedule an appointment as soon as possible for a visit in 3 week(s).   Specialty: Cardiology Contact information: [place] [address] [phone]              Allergies  Allergen Reactions  . Crestor [Rosuvastatin] Other (See Comments)    rhabdomyolosis  . Trulicity [Dulaglutide] Nausea And Vomiting  . Bee Venom Swelling    SWELLING REACTION UNSPECIFIED    Allergies as of 10/15/2019      Reactions   Crestor  [rosuvastatin] Other (See Comments)   rhabdomyolosis   Trulicity [dulaglutide] Nausea And Vomiting   Bee Venom Swelling   SWELLING REACTION UNSPECIFIED       Medication List    STOP taking these medications   amLODipine 5 MG tablet Commonly known as: NORVASC   benzonatate 100 MG capsule Commonly known as: TESSALON   oxyCODONE-acetaminophen 5-325 MG tablet Commonly known as: PERCOCET/ROXICET     TAKE these medications   aspirin 81 MG EC tablet Take 1 tablet (81 mg total) by mouth daily. With food   Blood Glucose Monitoring Suppl w/Device Kit Dispense based on patient and insurance preference. Check blood sugar daily before breakfast (ICD9 250.0)   clopidogrel 75 MG tablet Commonly known as: PLAVIX Take 1 tablet (75 mg total) by mouth daily.   Dulera 200-5 MCG/ACT Aero Generic drug: mometasone-formoterol Inhale 2 puffs into the lungs 2 (two) times daily.   Euthyrox 100 MCG  tablet Generic drug: levothyroxine Take 1 tablet (100 mcg total) by mouth daily.   ezetimibe 10 MG tablet Commonly known as: ZETIA Take 1 tablet (10 mg total) by mouth daily.   famotidine 20 MG tablet Commonly known as: PEPCID Take 20 mg by mouth 2 (two) times daily.   fenofibrate 145 MG tablet Commonly known as: TRICOR Take 1 tablet (145 mg total) by mouth daily.   glimepiride 2 MG tablet Commonly known as: AMARYL Take 1 tablet (2 mg total) by mouth daily with breakfast.   losartan 25 MG tablet Commonly known as: COZAAR Take 0.5 tablets (12.5 mg total) by mouth daily.   nitroGLYCERIN 0.4 MG SL tablet Commonly known as: NITROSTAT Place 1 tablet (0.4 mg total) under the tongue every 5 (five) minutes as needed for chest pain.   sitaGLIPtin 100 MG tablet Commonly known as: JANUVIA Take 1 tablet (100 mg total) by mouth daily.   Vitamin D (Ergocalciferol) 1.25 MG (50000 UNIT) Caps capsule Commonly known as: DRISDOL Take 1 capsule (50,000 Units total) by mouth every 7 (seven) days.        Procedures/Studies: CT Angio Head W or Wo Contrast  Result Date: 10/15/2019 CLINICAL DATA:  Chest pain with left arm and neck pain. EXAM: CT ANGIOGRAPHY HEAD AND NECK TECHNIQUE: Multidetector CT imaging of the head and neck was performed using the standard protocol during bolus administration of intravenous contrast. Multiplanar CT image reconstructions and MIPs were obtained to evaluate the vascular anatomy. Carotid stenosis measurements (when applicable) are obtained utilizing NASCET criteria, using the distal internal carotid diameter as the denominator. CONTRAST:  90m OMNIPAQUE IOHEXOL 350 MG/ML SOLN COMPARISON:  CTA head neck 07/27/2018 FINDINGS: CTA NECK FINDINGS SKELETON: There is no bony spinal canal stenosis. No lytic or blastic lesion. OTHER NECK: Normal pharynx, larynx and major salivary glands. No cervical lymphadenopathy. Unremarkable thyroid gland. UPPER CHEST: No pneumothorax or pleural effusion. No nodules or masses. AORTIC ARCH: There is no calcific atherosclerosis of the aortic arch. There is no aneurysm, dissection or hemodynamically significant stenosis of the visualized portion of the aorta. Conventional 3 vessel aortic branching pattern. The visualized proximal subclavian arteries are widely patent. RIGHT CAROTID SYSTEM: Normal without aneurysm, dissection or stenosis. LEFT CAROTID SYSTEM: Normal without aneurysm, dissection or stenosis. VERTEBRAL ARTERIES: Codominant configuration. Both origins are clearly patent. There is no dissection, occlusion or flow-limiting stenosis to the skull base (V1-V3 segments). CTA HEAD FINDINGS POSTERIOR CIRCULATION: --Vertebral arteries: Unchanged occlusion of the right V4 segment. Stent marker in the distal left V4 segment, extending to the distal basilar artery. The stent is patent. --Inferior cerebellar arteries: Normal. --Basilar artery: Normal. --Superior cerebellar arteries: Normal. --Posterior cerebral arteries (PCA): Normal. ANTERIOR  CIRCULATION: --Intracranial internal carotid arteries: Normal. --Anterior cerebral arteries (ACA): Normal. Both A1 segments are present. Patent anterior communicating artery (a-comm). --Middle cerebral arteries (MCA): Normal. VENOUS SINUSES: As permitted by contrast timing, patent. ANATOMIC VARIANTS: None Review of the MIP images confirms the above findings. IMPRESSION: 1. No emergent large vessel occlusion. 2. Patent stent extending from the left vertebral artery V4 segment to the distal basilar artery. 3. 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 4. Aortic Atherosclerosis (ICD10-I70.0). Electronically Signed   By: KUlyses JarredM.D.   On: 10/15/2019 03:10   CT Angio Neck W and/or Wo Contrast  Result Date: 10/15/2019 CLINICAL DATA:  Chest pain with left arm and neck pain. EXAM: CT ANGIOGRAPHY HEAD AND NECK TECHNIQUE: Multidetector CT imaging of the head and neck was performed  using the standard protocol during bolus administration of intravenous contrast. Multiplanar CT image reconstructions and MIPs were obtained to evaluate the vascular anatomy. Carotid stenosis measurements (when applicable) are obtained utilizing NASCET criteria, using the distal internal carotid diameter as the denominator. CONTRAST:  80m OMNIPAQUE IOHEXOL 350 MG/ML SOLN COMPARISON:  CTA head neck 07/27/2018 FINDINGS: CTA NECK FINDINGS SKELETON: There is no bony spinal canal stenosis. No lytic or blastic lesion. OTHER NECK: Normal pharynx, larynx and major salivary glands. No cervical lymphadenopathy. Unremarkable thyroid gland. UPPER CHEST: No pneumothorax or pleural effusion. No nodules or masses. AORTIC ARCH: There is no calcific atherosclerosis of the aortic arch. There is no aneurysm, dissection or hemodynamically significant stenosis of the visualized portion of the aorta. Conventional 3 vessel aortic branching pattern. The visualized proximal subclavian arteries are widely patent. RIGHT CAROTID SYSTEM: Normal without  aneurysm, dissection or stenosis. LEFT CAROTID SYSTEM: Normal without aneurysm, dissection or stenosis. VERTEBRAL ARTERIES: Codominant configuration. Both origins are clearly patent. There is no dissection, occlusion or flow-limiting stenosis to the skull base (V1-V3 segments). CTA HEAD FINDINGS POSTERIOR CIRCULATION: --Vertebral arteries: Unchanged occlusion of the right V4 segment. Stent marker in the distal left V4 segment, extending to the distal basilar artery. The stent is patent. --Inferior cerebellar arteries: Normal. --Basilar artery: Normal. --Superior cerebellar arteries: Normal. --Posterior cerebral arteries (PCA): Normal. ANTERIOR CIRCULATION: --Intracranial internal carotid arteries: Normal. --Anterior cerebral arteries (ACA): Normal. Both A1 segments are present. Patent anterior communicating artery (a-comm). --Middle cerebral arteries (MCA): Normal. VENOUS SINUSES: As permitted by contrast timing, patent. ANATOMIC VARIANTS: None Review of the MIP images confirms the above findings. IMPRESSION: 1. No emergent large vessel occlusion. 2. Patent stent extending from the left vertebral artery V4 segment to the distal basilar artery. 3. 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 4. Aortic Atherosclerosis (ICD10-I70.0). Electronically Signed   By: KUlyses JarredM.D.   On: 10/15/2019 03:10   DG Chest Portable 1 View  Result Date: 10/15/2019 CLINICAL DATA:  Left chest pain, left arm pain, and neck pain for 3 days. History of asthma, coronary artery disease, hypertension, diabetes. Current smoker. EXAM: PORTABLE CHEST 1 VIEW COMPARISON:  01/23/2019 FINDINGS: Normal heart size and pulmonary vascularity. Diffuse reticulonodular interstitial pattern, new since prior study. This could represent fibrosis or interstitial pneumonitis. Possible bronchiectasis also. No focal consolidation. No pleural effusions. No pneumothorax. Mediastinal contours appear intact. IMPRESSION: Diffuse reticulonodular  interstitial pattern to the lungs, new since prior study. Electronically Signed   By: WLucienne CapersM.D.   On: 10/15/2019 00:45   ECHOCARDIOGRAM COMPLETE  Result Date: 10/15/2019    ECHOCARDIOGRAM REPORT   Patient Name:   CEURA RADABAUGHDate of Exam: 10/15/2019 Medical Rec #:  [id number]     Height:       78.0 in Accession #:    [id number]    Weight:       260.0 lb Date of Birth:  [birth year]    BSA:          2.524 m Patient Age:    89years       BP:           118/68 mmHg Patient Gender: M              HR:           56 bpm. Exam [facility]:  [facility]Procedure: 2D Echo Indications:    Cardiomyopathy-Ischemic 414.8 / I25.5  History:  Patient has prior history of Echocardiogram examinations, most                 recent 02/09/2018. TIA, Signs/Symptoms:Chest Pain; Risk                 Factors:Hypertension, Diabetes and Current Smoker. Acute Renal                 Failure, Pericardial effusion,                 History of rhabdomyolysis.  Sonographer:    Leavy Cella RDCS (AE) Referring Phys: [id number] [place]  1. Left ventricular ejection fraction, by estimation, is 50 to 55%. The left ventricle has low normal function. The left ventricle has no regional wall motion abnormalities. There is moderate left ventricular hypertrophy. Left ventricular diastolic parameters are indeterminate.  2. Right ventricular systolic function is normal. The right ventricular size is normal.  3. The mitral valve is normal in structure. No evidence of mitral valve regurgitation. No evidence of mitral stenosis.  4. The aortic valve is tricuspid. Aortic valve regurgitation is not visualized. No aortic stenosis is present.  5. The inferior vena cava is normal in size with greater than 50% respiratory variability, suggesting right atrial pressure of 3 mmHg. FINDINGS  Left Ventricle: Left ventricular ejection fraction, by estimation, is 50 to 55%. The left ventricle has low normal function. The left ventricle  has no regional wall motion abnormalities. The left ventricular internal cavity size was normal in size. There is moderate left ventricular hypertrophy. Left ventricular diastolic parameters are indeterminate. Right Ventricle: The right ventricular size is normal. No increase in right ventricular wall thickness. Right ventricular systolic function is normal. Left Atrium: Left atrial size was normal in size. Right Atrium: Right atrial size was normal in size. Pericardium: There is no evidence of pericardial effusion. Mitral Valve: The mitral valve is normal in structure. No evidence of mitral valve regurgitation. No evidence of mitral valve stenosis. Tricuspid Valve: The tricuspid valve is normal in structure. Tricuspid valve regurgitation is not demonstrated. No evidence of tricuspid stenosis. Aortic Valve: The aortic valve is tricuspid. Aortic valve regurgitation is not visualized. No aortic stenosis is present. Aortic valve mean gradient measures 3.5 mmHg. Aortic valve peak gradient measures 7.3 mmHg. Aortic valve area, by VTI measures 1.92 cm. Pulmonic Valve: The pulmonic valve was not well visualized. Pulmonic valve regurgitation is not visualized. No evidence of pulmonic stenosis. Aorta: The aortic root is normal in size and structure. Pulmonary Artery: Indeterminant PASP, inadequate TR jet. Venous: The inferior vena cava is normal in size with greater than 50% respiratory variability, suggesting right atrial pressure of 3 mmHg. IAS/Shunts: No atrial level shunt detected by color flow Doppler.  LEFT VENTRICLE PLAX 2D LVIDd:         4.69 cm  Diastology LVIDs:         3.38 cm  LV e' lateral:   7.29 cm/s LV PW:         1.40 cm  LV E/e' lateral: 12.0 LV IVS:        1.35 cm  LV e' medial:    6.53 cm/s LVOT diam:     2.10 cm  LV E/e' medial:  13.4 LV SV:         52 LV SV Index:   21 LVOT Area:     3.46 cm  RIGHT VENTRICLE RV S prime:     9.36 cm/s TAPSE (M-mode): 1.8  cm LEFT ATRIUM             Index       RIGHT  ATRIUM           Index LA diam:        4.10 cm 1.62 cm/m  RA Area:     11.10 cm LA Vol (A2C):   49.0 ml 19.42 ml/m RA Volume:   24.60 ml  9.75 ml/m LA Vol (A4C):   40.9 ml 16.21 ml/m LA Biplane Vol: 47.0 ml 18.62 ml/m  AORTIC VALVE AV Area (Vmax):    1.99 cm AV Area (Vmean):   2.06 cm AV Area (VTI):     1.92 cm AV Vmax:           134.96 cm/s AV Vmean:          87.464 cm/s AV VTI:            0.271 m AV Peak Grad:      7.3 mmHg AV Mean Grad:      3.5 mmHg LVOT Vmax:         77.55 cm/s LVOT Vmean:        51.936 cm/s LVOT VTI:          0.150 m LVOT/AV VTI ratio: 0.55  AORTA Ao Root diam: 3.50 cm MITRAL VALVE MV Area (PHT): 3.15 cm    SHUNTS MV Decel Time: 241 msec    Systemic VTI:  0.15 m MV E velocity: 87.80 cm/s  Systemic Diam: 2.10 cm MV A velocity: 55.70 cm/s MV E/A ratio:  1.58 Carlyle Dolly MD Electronically signed by Carlyle Dolly MD Signature Date/Time: 10/15/2019/1:01:45 PM    Final       Subjective: Pt without complaints.   Discharge Exam: Vitals:   10/15/19 0902 10/15/19 0930  BP: 107/66 118/68  Pulse: (!) 51 (!) 56  Resp:  16  Temp:    SpO2: 92% 94%   Vitals:   10/15/19 0802 10/15/19 0832 10/15/19 0902 10/15/19 0930  BP: 124/78 122/73 107/66 118/68  Pulse: 61 (!) 54 (!) 51 (!) 56  Resp: 18   16  Temp:      TempSrc:      SpO2: 91% 94% 92% 94%  Weight:      Height:       General: Pt is alert, awake, not in acute distress Cardiovascular: RRR, S1/S2 +, no rubs, no gallops Respiratory: CTA bilaterally, no wheezing, no rhonchi Abdominal: Soft, NT, ND, bowel sounds + Extremities: no edema, no cyanosis   The results of significant diagnostics from this hospitalization (including imaging, microbiology, ancillary and laboratory) are listed below for reference.     Microbiology: Recent Results (from the past 240 hour(s))  SARS Coronavirus 2 by RT PCR (hospital order, performed in [facility] hospital lab) Nasopharyngeal Nasopharyngeal Swab     Status: None   Collection  Time: 10/15/19  3:55 AM   Specimen: Nasopharyngeal Swab  Result Value Ref Range Status   SARS Coronavirus 2 NEGATIVE NEGATIVE Final    Comment: (NOTE) SARS-CoV-2 target nucleic acids are NOT DETECTED.  The SARS-CoV-2 RNA is generally detectable in upper and lower respiratory specimens during the acute phase of infection. The lowest concentration of SARS-CoV-2 viral copies this assay can detect is 250 copies / mL. A negative result does not preclude SARS-CoV-2 infection and should not be used as the sole basis for treatment or other patient management decisions.  A negative result may occur with improper specimen collection /  handling, submission of specimen other than nasopharyngeal swab, presence of viral mutation(s) within the areas targeted by this assay, and inadequate number of viral copies (<250 copies / mL). A negative result must be combined with clinical observations, patient history, and epidemiological information.  Fact Sheet for Patients:   StrictlyIdeas.no  Fact Sheet for Healthcare Providers: BankingDealers.co.za  This test is not yet approved or  cleared by the [place] FDA and has been authorized for detection and/or diagnosis of SARS-CoV-2 by FDA under an Emergency Use Authorization (EUA).  This EUA will remain in effect (meaning this test can be used) for the duration of the COVID-19 declaration under Section 564(b)(1) of the Act, 21 U.S.C. section 360bbb-3(b)(1), unless the authorization is terminated or revoked sooner.  Performed at [facility], [address], [place] [employer], [place] [address]      Labs: BNP (last 3 results) Recent Labs    10/15/19 0728  BNP 60.0   Basic Metabolic Panel: Recent Labs  Lab 10/15/19 0046 10/15/19 0728  NA 131*  --   K 3.7  --   CL 93*  --   CO2 26  --   GLUCOSE 380*  --   BUN 14  --   CREATININE 1.11 1.10  CALCIUM 10.1  --    Liver Function Tests: No results for  input(s): AST, ALT, ALKPHOS, BILITOT, PROT, ALBUMIN in the last 168 hours. No results for input(s): LIPASE, AMYLASE in the last 168 hours. No results for input(s): AMMONIA in the last 168 hours. CBC: Recent Labs  Lab 10/15/19 0046 10/15/19 0728  WBC 10.7* 11.7*  HGB 16.3 16.2  HCT 49.8 50.2  MCV 90.2 88.8  PLT 193 216   Cardiac Enzymes: No results for input(s): CKTOTAL, CKMB, CKMBINDEX, TROPONINI in the last 168 hours. BNP: Invalid input(s): POCBNP CBG: No results for input(s): GLUCAP in the last 168 hours. D-Dimer No results for input(s): DDIMER in the last 72 hours. Hgb A1c Recent Labs    10/15/19 0728  HGBA1C 13.8*   Lipid Profile Recent Labs    10/15/19 0728  CHOL 709*  HDL 36*  LDLCALC UNABLE TO CALCULATE IF TRIGLYCERIDE OVER 400 mg/dL  TRIG 2,420*  CHOLHDL NOT REPORTED DUE TO HIGH TRIGLYCERIDES  LDLDIRECT UNABLE TO CALCULATE IF TRIGLYCERIDE IS >1293 mg/dL   Thyroid function studies Recent Labs    10/15/19 0728  TSH 83.261*   Anemia work up No results for input(s): VITAMINB12, FOLATE, FERRITIN, TIBC, IRON, RETICCTPCT in the last 72 hours. Urinalysis    Component Value Date/Time   COLORURINE YELLOW 05/15/2019 1658   APPEARANCEUR CLEAR 05/15/2019 1658   LABSPEC 1.025 05/15/2019 1658   PHURINE 5.0 05/15/2019 1658   GLUCOSEU >=500 (A) 05/15/2019 1658   HGBUR MODERATE (A) 05/15/2019 1658   BILIRUBINUR NEGATIVE 05/15/2019 1658   KETONESUR NEGATIVE 05/15/2019 1658   PROTEINUR 30 (A) 05/15/2019 1658   UROBILINOGEN 0.2 10/06/2013 0305   NITRITE NEGATIVE 05/15/2019 1658   LEUKOCYTESUR NEGATIVE 05/15/2019 1658   Sepsis Labs Invalid input(s): PROCALCITONIN,  WBC,  LACTICIDVEN Microbiology Recent Results (from the past 240 hour(s))  SARS Coronavirus 2 by RT PCR (hospital order, performed in [place] hospital lab) Nasopharyngeal Nasopharyngeal Swab     Status: None   Collection Time: 10/15/19  3:55 AM   Specimen: Nasopharyngeal Swab  Result Value Ref  Range Status   SARS Coronavirus 2 NEGATIVE NEGATIVE Final    Comment: (NOTE) SARS-CoV-2 target nucleic acids are NOT DETECTED.  The SARS-CoV-2 RNA is generally  detectable in upper and lower respiratory specimens during the acute phase of infection. The lowest concentration of SARS-CoV-2 viral copies this assay can detect is 250 copies / mL. A negative result does not preclude SARS-CoV-2 infection and should not be used as the sole basis for treatment or other patient management decisions.  A negative result may occur with improper specimen collection / handling, submission of specimen other than nasopharyngeal swab, presence of viral mutation(s) within the areas targeted by this assay, and inadequate number of viral copies (<250 copies / mL). A negative result must be combined with clinical observations, patient history, and epidemiological information.  Fact Sheet for Patients:   StrictlyIdeas.no  Fact Sheet for Healthcare Providers: [email or web address]  This test is not yet approved or  cleared by the [place] FDA and has been authorized for detection and/or diagnosis of SARS-CoV-2 by FDA under an Emergency Use Authorization (EUA).  This EUA will remain in effect (meaning this test can be used) for the duration of the COVID-19 declaration under Section 564(b)(1) of the Act, 21 U.S.C. section 360bbb-3(b)(1), unless the authorization is terminated or revoked sooner.  Performed at [facility], [address] [place], [place] [address]    Time coordinating discharge:   SIGNED:  Irwin Brakeman, MD  [facility] 10/15/2019, 1:54 PM How to contact the [facility] Attending or Consulting provider [place] or covering provider during after hours [place], for this patient?  1. Check the care team in [facility] and look for a) attending/consulting [facility] provider listed and b) the [facility] team listed 2. Log into [email or web address] and use [place]'s  universal password to access. If you do not have the password, please contact the hospital operator. 3. Locate the [facility] provider you are looking for under [facility] and page to a number that you can be directly reached. 4. If you still have difficulty reaching the provider, please page the [facility] (Director on Call) for the Hospitalists listed on amion for assistance.

## 2019-10-15 NOTE — Discharge Instructions (Signed)

## 2019-10-15 NOTE — ED Notes (Signed)
Pt placed on cardiac monitor 

## 2019-10-15 NOTE — ED Notes (Signed)
Ate 100% of breakfast

## 2019-10-15 NOTE — ED Provider Notes (Signed)
[facility] EMERGENCY DEPARTMENT Provider Note   CSN: [id number] Arrival date & time: 10/14/19  2224     History Chief Complaint  Patient presents with  . Chest Pain    neck left arm pain    DAINEL [name] is a 49 y.o. male.  Patient is a 49 year old male with past medical history of coronary artery disease with MI/stents, CHF, ischemic cardiomyopathy, diabetes, prior stroke with left hemiparesis.  Patient presents today for evaluation of chest discomfort.  This has been worsening over the past 3 days and began in the absence of any injury or trauma.  He describes a pain to the left side of his chest that radiates down his left arm and into his left neck.  He also describes numbness in his third, fourth, and fingers of his left hand.  He denies shortness of breath, nausea, or diaphoresis.  He also tells me he has had a stent placed in one of the arteries in his neck in the past.  Patient is supposed to be on multiple medications for his heart including Plavix as well as medication for blood pressure and diabetes, however has been off of these for the past month.  He has apparently had some disagreement with his primary care provider and is in the process of obtaining a new doctor.  He has since run out of his medications.  The history is provided by the patient.       Past Medical History:  Diagnosis Date  . Asthma   . Chest pain   . CHF (congestive heart failure) ([place])   . Coronary artery disease    a. s/p DES x2 to LAD and DES to OM in 11/2017  . Depression   . Dyspnea   . GERD (gastroesophageal reflux disease)   . Headache   . History of kidney stones   . History of rhabdomyolysis   . Hyperlipidemia   . Hypertension   . Hypothyroidism   . Ischemic cardiomyopathy    a. 11/2017: echo showing EF of 35-40%, diffuse HK, and Grade 2 DD  . Kidney calculus 2014  . Pericardial effusion    Small, by dobutamine echocardiogram, 04/2006  . PONV (postoperative nausea and vomiting)     . Sleep apnea    does not wear cpap  . Stroke ([place])   . Tobacco abuse   . Type 2 diabetes mellitus without complications ([place]) 1/74/0814  . Vitamin D deficiency     Patient Active Problem List   Diagnosis Date Noted  . Ischemic cardiomyopathy 10/02/2018  . 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 Acute cerebrovascular accident (CVA) (Devin [name]) 02/10/2018  . Chronic combined systolic and diastolic CHF (congestive heart failure) ([place]) 01/06/2018  . Altered mental status 01/05/2018  . History of stroke 01/05/2018  . 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 Cerebellar stroke ([place])   . TIA (transient ischemic attack) 11/30/2017  . Abnormal cardiac CT angiography   . Coronary artery disease   . Chest pain due to CAD ([place]) 11/14/2017  . Chest pain with moderate risk for cardiac etiology   . CKD (chronic kidney disease) 11/13/2017  . Hypothyroidism 10/07/2013  . Type 2 diabetes mellitus without complications ([place]) 48/18/5631  . Acute renal failure ([place]) 10/06/2013  . Elevated AST (SGOT) 10/06/2013  . Elevated ALT measurement 10/06/2013  . Chest pain   . Tobacco abuse   . Hypertension   . Hyperlipidemia   . Pericardial  effusion   . History of rhabdomyolysis     Past Surgical History:  Procedure Laterality Date  . APPENDECTOMY    . 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 CHOLECYSTECTOMY N/A 01/27/2013   Procedure: LAPAROSCOPIC CHOLECYSTECTOMY;  Surgeon: Jamesetta So, MD;  Location: [facility];  Service: General;  Laterality: N/A;  . CORONARY STENT INTERVENTION N/A 11/15/2017   Procedure: CORONARY STENT INTERVENTION;  Surgeon: Leonie Man, MD;  Location: [place] CV [facility];  Service: Cardiovascular;  Laterality: N/A;  . HERNIA REPAIR     As a child  . INSERTION OF MESH N/A 11/13/2012   Procedure: INSERTION OF MESH;   Surgeon: Jamesetta So, MD;  Location: [facility];  Service: General;  Laterality: N/A;  . IR ANGIO INTRA EXTRACRAN SEL COM CAROTID INNOMINATE BILAT MOD SED  12/01/2017  . IR ANGIO INTRA EXTRACRAN SEL COM CAROTID INNOMINATE BILAT MOD SED  02/13/2018  . IR ANGIO VERTEBRAL SEL SUBCLAVIAN INNOMINATE UNI L MOD SED  02/13/2018  . IR ANGIO VERTEBRAL SEL VERTEBRAL BILAT MOD SED  12/01/2017  . IR ANGIO VERTEBRAL SEL VERTEBRAL UNI R MOD SED  02/13/2018  . IR INTRA CRAN STENT  12/03/2017  . IR PTA INTRACRANIAL  02/25/2018  . IR US GUIDE VASC ACCESS RIGHT  02/13/2018  . KIDNEY STONE SURGERY    . LEFT HEART CATH AND CORONARY ANGIOGRAPHY N/A 11/15/2017   Procedure: LEFT HEART CATH AND CORONARY ANGIOGRAPHY;  Surgeon: Leonie Man, MD;  Location: [place] [facility];  Service: Cardiovascular;  Laterality: N/A;  . PERCUTANEOUS NEPHROLITHOTRIPSY    . RADIOLOGY WITH ANESTHESIA Left 12/03/2017   Procedure: Angioplasty with possible stenting of left VBJ;  Surgeon: Luanne Bras, MD;  Location: [place];  Service: Radiology;  Laterality: Left;  . RADIOLOGY WITH ANESTHESIA N/A 02/25/2018   Procedure: STENT PLACEMENT;  Surgeon: Luanne Bras, MD;  Location: [place];  Service: Radiology;  Laterality: N/A;  . UMBILICAL HERNIA REPAIR N/A 11/13/2012   Procedure: UMBILICAL HERNIORRHAPHY;  Surgeon: Jamesetta So, MD;  Location: [facility];  Service: General;  Laterality: N/A;  . VARICOCELECTOMY         Family History  Problem Relation Age of Onset  . Coronary artery disease Father   . Hypertension Father   . Hyperlipidemia Father   . Diabetes Father   . Congestive Heart Failure Father 23  . Arrhythmia Father        had an ICD  . Diabetes Mother   . Hypertension Mother   . Hyperlipidemia Mother   . Obesity Mother 91       died after bariatric surgery, liver failure and infection  . Coronary artery disease Maternal Grandfather        both grandfathers and several uncles  . Coronary artery disease Other   . Diabetes  Sister        both sisters  . Stroke Maternal Aunt   . Stroke Maternal Uncle     Social History   Tobacco Use  . Smoking status: Current Every Day Smoker    Packs/day: 0.50    Years: 26.00    Pack years: 13.00    Types: Cigarettes    Start date: 03/13/1985  . Smokeless tobacco: Former Systems developer    Types: Snuff, Sarina Ser    Quit date: 03/14/1987  Vaping Use  . Vaping Use: Former  . Devices: quit in 2011  Substance  Use Topics  . Alcohol use: No  . Drug use: No    Home Medications Prior to Admission medications   Medication Sig Start Date End Date Taking? Authorizing Provider  amLODipine (NORVASC) 5 MG tablet Take 1 tablet (5 mg total) by mouth daily. 10/07/18 07/28/19  Imogene Burn, PA-C  aspirin 81 MG EC tablet Take 1 tablet (81 mg total) by mouth daily. With food 12/05/17   Roxan Hockey, MD  benzonatate (TESSALON) 100 MG capsule Take 2 capsules (200 mg total) by mouth 3 (three) times daily as needed. 01/23/19   Evalee Jefferson, PA-C  Blood Glucose Monitoring Suppl w/Device KIT Dispense based on patient and insurance preference. Check blood sugar daily before breakfast (ICD9 250.0) 04/29/17   Pollina, Gwenyth Allegra, MD  clopidogrel (PLAVIX) 75 MG tablet Take 75 mg by mouth daily.    [provider]  ezetimibe (ZETIA) 10 MG tablet Take 1 tablet (10 mg total) by mouth daily. 12/06/17   Roxan Hockey, MD  famotidine (PEPCID) 20 MG tablet Take 20 mg by mouth 2 (two) times daily.    [provider]  fenofibrate (TRICOR) 145 MG tablet Take 145 mg by mouth daily.    [provider]  glimepiride (AMARYL) 2 MG tablet Take 1 tablet (2 mg total) by mouth daily with breakfast. 11/16/17   Patrecia Pour, MD  Levothyroxine Sodium (EUTHYROX PO) Take 300 mg by mouth.    [provider]  losartan (COZAAR) 25 MG tablet Take 0.5 tablets (12.5 mg total) by mouth daily. 07/28/19 10/26/19  Arnoldo Lenis, MD  nitroGLYCERIN (NITROSTAT) 0.4 MG SL tablet Place 1 tablet (0.4 mg  total) under the tongue every 5 (five) minutes as needed for chest pain. 01/15/18   Strader, Fransisco Hertz, PA-C  oxyCODONE-acetaminophen (PERCOCET/ROXICET) 5-325 MG tablet Take 1 tablet by mouth every 4 (four) hours as needed for severe pain. 05/15/19   Evalee Jefferson, PA-C  sitaGLIPtin (JANUVIA) 100 MG tablet Take 100 mg by mouth daily.    [provider]    Allergies    Crestor [rosuvastatin], Trulicity [dulaglutide], and Bee venom  Review of Systems   Review of Systems  All other systems reviewed and are negative.   Physical Exam Updated Vital Signs BP (!) 170/103 (BP Location: Right Arm)   Pulse 69   Temp 98.5 F (36.9 C) (Oral)   Resp 18   Ht _0  (1.981 m)   Wt 117.9 kg   SpO2 99%   BMI 30.05 kg/m   Physical Exam Vitals and nursing note reviewed.  Constitutional:      General: He is not in acute distress.    Appearance: He is well-developed. He is not diaphoretic.  HENT:     Head: Normocephalic and atraumatic.  Cardiovascular:     Rate and Rhythm: Normal rate and regular rhythm.     Heart sounds: No murmur heard.  No friction rub.  Pulmonary:     Effort: Pulmonary effort is normal. No respiratory distress.     Breath sounds: Normal breath sounds. No wheezing or rales.  Abdominal:     General: Bowel sounds are normal. There is no distension.     Palpations: Abdomen is soft.     Tenderness: There is no abdominal tenderness.  Musculoskeletal:        General: Normal range of motion.     Cervical back: Normal range of motion and neck supple.  Skin:    General: Skin is warm and dry.  Neurological:     Mental Status: He is alert and oriented to person, place, and time.     Cranial Nerves: No cranial nerve deficit.     Motor: Weakness present.     Coordination: Coordination normal.     Comments: Strength is 4+ out of 5 in the left arm and left leg and normal on the right.     ED Results / Procedures / Treatments   Labs (all labs ordered are listed, but only  abnormal results are displayed) Labs Reviewed  CBC  BASIC METABOLIC PANEL  TROPONIN I (HIGH SENSITIVITY)  TROPONIN I (HIGH SENSITIVITY)    EKG EKG Interpretation  Date/Time:  Tuesday October 14 2019 22:45:20 EDT Ventricular Rate:  67 PR Interval:  164 QRS Duration: 98 QT Interval:  380 QTC Calculation: 401 R Axis:   128 Text Interpretation: Normal sinus rhythm Incomplete right bundle branch block Left posterior fascicular block T wave abnormality, consider inferior ischemia Abnormal ECG No significant change since 02/08/2018 Confirmed by Veryl Speak [phone]) on 10/14/2019 11:18:07 PM   Radiology No results found.  Procedures Procedures (including critical care time)  Medications Ordered in ED Medications - No data to display  ED Course  I have reviewed the triage vital signs and the nursing notes.  Pertinent labs & imaging results that were available during my care of the patient were reviewed by me and considered in my medical decision making (see chart for details).    MDM Rules/Calculators/A&P  Patient with history of coronary artery disease with stent and prior CVA with vertebral artery stent presenting with complaints of chest pain radiating into his left arm as well as weakness and numbness of his third, fourth, and fifth finger.  Patient's cardiac work-up thus far is unremarkable.  His troponin is negative and EKG is unchanged.  A CTA of the head and neck was performed to evaluate the patency of his vertebral artery stent.  There appeared to be no emergent vessel occlusions and the stent in the vertebral artery appears patent.  At this point, I am uncertain as to what is causing his discomfort.  This does not seem to be his typical heart pain, but given his history feel as though admission for observation and possible cardiology consultation is appropriate.  I have spoken with Dr. Josephine Cables who agrees to admit.  Final Clinical Impression(s) / ED Diagnoses Final  diagnoses:  None    Rx / DC Orders ED Discharge Orders    None       Veryl Speak, MD 10/15/19 [phone]

## 2019-10-15 NOTE — ED Notes (Signed)
All meds are not available at this time.

## 2019-10-15 NOTE — Progress Notes (Signed)
*  PRELIMINARY RESULTS* Echocardiogram 2D Echocardiogram has been performed.  Devin [name] 10/15/2019, 11:28 AM

## 2019-10-27 ENCOUNTER — Other Ambulatory Visit: Payer: Self-pay

## 2019-10-27 ENCOUNTER — Encounter: Payer: Self-pay | Admitting: Neurology

## 2019-10-27 ENCOUNTER — Ambulatory Visit (INDEPENDENT_AMBULATORY_CARE_PROVIDER_SITE_OTHER): Payer: Medicaid Other | Admitting: Neurology

## 2019-10-27 VITALS — BP 131/83 | HR 67 | Ht 78.0 in | Wt 272.5 lb

## 2019-10-27 DIAGNOSIS — R29898 Other symptoms and signs involving the musculoskeletal system: Secondary | ICD-10-CM

## 2019-10-27 DIAGNOSIS — R200 Anesthesia of skin: Secondary | ICD-10-CM

## 2019-10-27 DIAGNOSIS — I6501 Occlusion and stenosis of right vertebral artery: Secondary | ICD-10-CM

## 2019-10-27 DIAGNOSIS — G5622 Lesion of ulnar nerve, left upper limb: Secondary | ICD-10-CM

## 2019-10-27 DIAGNOSIS — Z8673 Personal history of transient ischemic attack (TIA), and cerebral infarction without residual deficits: Secondary | ICD-10-CM

## 2019-10-27 NOTE — Progress Notes (Signed)
[facility]  PATIENT: Devin [name] DOB: 1970-07-10  REFERRING DOCTOR OR PCP: [facility] SOURCE: Patient, notes from primary care, imaging reports and MRI and CTA images personally reviewed  _________________________________   HISTORICAL  CHIEF COMPLAINT:  Chief Complaint  Patient presents with  . New Patient (Initial Visit)    RM 12. Paper referral from Stana Bunting, MD for HX of CVA and possible peripheral neuropathy/nerve impingement LUE affecting ulnar nerve  . Gait Problem    Ambulates with cane. No falls in last year.     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    The numbness is mostly in the upper arm down to the 3rd through 5th fingers.    Quality is tingling and can be painful at times when it intensifies.  He has intermittent neck pain and tingling.    He reports having a stroke August 2019 that has affected the left strength.  This followed stents in the heart.   He was found to have a stroke.    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 [name]).   He had multiple small pontine/left occipital lobe/cerebellum strokes 01/06/2018.     His left leg gets intermittent numbness and has felt a little weak since the stroke.     Vascular risk factors:   He has essential HTN, Type 2 NIDDM and tobacco abuse (1 ppd).   He has been on Plavix and bASA since 2019  I reviewed multiple imaging studies: CTA head/neck 10/15/2019 showed patent stent from left V4 to basilar artery and stable occluded distal right V4 segment.  MRI of the brain and MR angiogram 07/28/2018: MRI    MR angiogram shows occlusion of the distal right vertebral artery and normal flow in the left vertebral artery and the basilar artery  MRI of the brain January 06, 2018 showed several very small strokes in the distribution of the left vertebral and basilar artery.  REVIEW OF SYSTEMS: Constitutional: No fevers, chills, sweats, or change in appetite Eyes: No visual changes, double vision, eye pain Ear, nose and throat: No hearing loss, ear pain, nasal congestion, sore throat Cardiovascular: No chest pain, palpitations Respiratory: No shortness of breath at rest or with exertion.   No wheezes GastrointestinaI: No nausea, vomiting, diarrhea, abdominal pain, fecal incontinence Genitourinary: No dysuria, urinary retention or frequency.  No nocturia. Musculoskeletal: No neck pain, back pain Integumentary: No rash, pruritus, skin lesions Neurological: as above Psychiatric: No depression at this time.  No anxiety Endocrine: No palpitations, diaphoresis, change in appetite, change in weigh or increased thirst Hematologic/Lymphatic: No anemia, purpura, petechiae. Allergic/Immunologic: No itchy/runny eyes, nasal congestion, recent allergic reactions, rashes  ALLERGIES: Allergies  Allergen Reactions  . Crestor [Rosuvastatin] Other (See Comments)    rhabdomyolosis  . Trulicity [Dulaglutide] Nausea And Vomiting  . Bee Venom Swelling    SWELLING REACTION UNSPECIFIED     HOME MEDICATIONS:  Current Outpatient Medications:  .  aspirin 81 MG EC tablet, Take 1 tablet (81 mg total) by mouth daily. With food, Disp: 30 tablet, Rfl: 0 .  Blood Glucose Monitoring Suppl w/Device KIT, Dispense based on patient  and insurance preference. Check blood sugar daily before breakfast (ICD9 250.0), Disp: 1 kit, Rfl: 1 .  clopidogrel (PLAVIX) 75 MG tablet, Take 1 tablet (75 mg total) by mouth daily., Disp: 30 tablet, Rfl: 1 .  DULERA 200-5 MCG/ACT AERO, Inhale 2 puffs into the lungs 2 (two) times daily., Disp:  8.8 g, Rfl: 1 .  EUTHYROX 100 MCG tablet, Take 1 tablet (100 mcg total) by mouth daily., Disp: 30 tablet, Rfl: 1 .  ezetimibe (ZETIA) 10 MG tablet, Take 1 tablet (10 mg total) by mouth daily., Disp: 30 tablet, Rfl: 1 .  famotidine (PEPCID) 20 MG tablet, Take 20 mg by mouth 2 (two) times daily., Disp: , Rfl:  .  fenofibrate (TRICOR) 145 MG tablet, Take 1 tablet (145 mg total) by mouth daily., Disp: 30 tablet, Rfl: 1 .  glimepiride (AMARYL) 2 MG tablet, Take 1 tablet (2 mg total) by mouth daily with breakfast., Disp: 30 tablet, Rfl: 1 .  losartan (COZAAR) 25 MG tablet, Take 0.5 tablets (12.5 mg total) by mouth daily., Disp: 15 tablet, Rfl: 1 .  nitroGLYCERIN (NITROSTAT) 0.4 MG SL tablet, Place 1 tablet (0.4 mg total) under the tongue every 5 (five) minutes as needed for chest pain., Disp: 25 tablet, Rfl: 1 .  sitaGLIPtin (JANUVIA) 100 MG tablet, Take 1 tablet (100 mg total) by mouth daily., Disp: 30 tablet, Rfl: 1 .  Vitamin D, Ergocalciferol, (DRISDOL) 1.25 MG (50000 UNIT) CAPS capsule, Take 1 capsule (50,000 Units total) by mouth every 7 (seven) days., Disp: 8 capsule, Rfl: 0  PAST MEDICAL HISTORY: Past Medical History:  Diagnosis Date  . Asthma   . Chest pain   . CHF (congestive heart failure) ([place])   . Coronary artery disease    a. s/p DES x2 to LAD and DES to OM in 11/2017  . Depression   . Dyspnea   . GERD (gastroesophageal reflux disease)   . Headache   . History of kidney stones   . History of rhabdomyolysis   . Hyperlipidemia   . Hypertension   . Hypothyroidism   . Ischemic cardiomyopathy    a. 11/2017: echo showing EF of 35-40%, diffuse HK, and Grade 2 DD  . Kidney calculus 2014  . Pericardial effusion    Small, by dobutamine echocardiogram, 04/2006  . PONV (postoperative nausea and vomiting)   . Sleep apnea    does not wear cpap  . Stroke ([place])   . Tobacco abuse   . Type 2 diabetes mellitus without complications ([place]) 9/70/2637  . Vitamin D deficiency     PAST  SURGICAL HISTORY: Past Surgical History:  Procedure Laterality Date  . APPENDECTOMY    . 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 CHOLECYSTECTOMY N/A 01/27/2013   Procedure: LAPAROSCOPIC CHOLECYSTECTOMY;  Surgeon: Jamesetta So, MD;  Location: [facility];  Service: General;  Laterality: N/A;  . CORONARY STENT INTERVENTION N/A 11/15/2017   Procedure: CORONARY STENT INTERVENTION;  Surgeon: Leonie Man, MD;  Location: [place] [facility];  Service: Cardiovascular;  Laterality: N/A;  . HERNIA REPAIR     As a child  . INSERTION OF MESH N/A 11/13/2012   Procedure: INSERTION OF MESH;  Surgeon: Jamesetta So, MD;  Location: [facility];  Service: General;  Laterality: N/A;  . IR ANGIO INTRA EXTRACRAN SEL COM CAROTID INNOMINATE BILAT MOD SED  12/01/2017  . IR ANGIO INTRA EXTRACRAN SEL COM  CAROTID INNOMINATE BILAT MOD SED  02/13/2018  . IR ANGIO VERTEBRAL SEL SUBCLAVIAN INNOMINATE UNI L MOD SED  02/13/2018  . IR ANGIO VERTEBRAL SEL VERTEBRAL BILAT MOD SED  12/01/2017  . IR ANGIO VERTEBRAL SEL VERTEBRAL UNI R MOD SED  02/13/2018  . IR INTRA CRAN STENT  12/03/2017  . IR PTA INTRACRANIAL  02/25/2018  . IR US GUIDE VASC ACCESS RIGHT  02/13/2018  . KIDNEY STONE SURGERY    . LEFT HEART CATH AND CORONARY ANGIOGRAPHY N/A 11/15/2017   Procedure: LEFT HEART CATH AND CORONARY ANGIOGRAPHY;  Surgeon: Leonie Man, MD;  Location: [place] [facility];  Service: Cardiovascular;  Laterality: N/A;  . PERCUTANEOUS NEPHROLITHOTRIPSY    . RADIOLOGY WITH ANESTHESIA Left 12/03/2017   Procedure: Angioplasty with possible stenting of left VBJ;  Surgeon: Luanne Bras, MD;  Location: [place];  Service: Radiology;  Laterality: Left;  . RADIOLOGY WITH ANESTHESIA N/A 02/25/2018   Procedure: STENT PLACEMENT;  Surgeon: Luanne Bras, MD;  Location: [place];  Service: Radiology;  Laterality: N/A;  . UMBILICAL HERNIA REPAIR N/A 11/13/2012   Procedure:  UMBILICAL HERNIORRHAPHY;  Surgeon: Jamesetta So, MD;  Location: [facility];  Service: General;  Laterality: N/A;  . VARICOCELECTOMY      FAMILY HISTORY: Family History  Problem Relation Age of Onset  . Coronary artery disease Father   . Hypertension Father   . Hyperlipidemia Father   . Diabetes Father   . Congestive Heart Failure Father 59  . Arrhythmia Father        had an ICD  . Diabetes Mother   . Hypertension Mother   . Hyperlipidemia Mother   . Obesity Mother 70       died after bariatric surgery, liver failure and infection  . Coronary artery disease Maternal Grandfather        both grandfathers and several uncles  . Coronary artery disease Other   . Diabetes Sister        both sisters  . Stroke Maternal Aunt   . Stroke Maternal Uncle     SOCIAL HISTORY:  Social History   Socioeconomic History  . Marital status: Divorced    Spouse name: Not on file  . Number of children: Not on file  . Years of education: Not on file  . Highest education level: Not on file  Occupational History  . Occupation: Disability     Comment:      Employer: [employer]  Tobacco Use  . Smoking status: Current Every Day Smoker    Packs/day: 1.00    Years: 26.00    Pack years: 26.00    Types: Cigarettes    Start date: 03/13/1985  . Smokeless tobacco: Former Systems developer    Types: Snuff, Sarina Ser    Quit date: 03/14/1987  Vaping Use  . Vaping Use: Former  . Devices: quit in 2011  Substance and Sexual Activity  . Alcohol use: No  . Drug use: No  . Sexual activity: Not on file  Other Topics Concern  . Not on file  Social History Narrative   Eats fast food daily   No exercise outside of work   Lives in [place] with his dog   MAINTENANCE TECHNICIAN   Caffeine use: tea/soda daily   Left handed    Social Determinants of Health   Financial Resource Strain:   . Difficulty of Paying Living Expenses:   Food Insecurity:   . Worried About Charity fundraiser in the Last Year:   .  Ran Out of Food in  the Last Year:   Transportation Needs:   . Film/video editor (Medical):   Marland Kitchen Lack of Transportation (Non-Medical):   Physical Activity:   . Days of Exercise per Week:   . Minutes of Exercise per Session:   Stress:   . Feeling of Stress :   Social Connections:   . Frequency of Communication with Friends and Family:   . Frequency of Social Gatherings with Friends and Family:   . Attends Religious Services:   . Active Member of Clubs or Organizations:   . Attends Archivist Meetings:   Marland Kitchen Marital Status:   Intimate Partner Violence:   . Fear of Current or Ex-Partner:   . Emotionally Abused:   Marland Kitchen Physically Abused:   . Sexually Abused:      PHYSICAL EXAM  Vitals:   10/27/19 1118  BP: 131/83  Pulse: 67  Weight: 272 lb 8 oz (123.6 kg)  Height: 6' 6"  (1.981 m)    Body mass index is 31.49 kg/m.   General: The patient is well-developed and well-nourished and in no acute distress  HEENT:  Head is [place]/AT.  Sclera are anicteric.   Neck: No carotid bruits are noted.  The neck is nontender.  Cardiovascular: The heart has a regular rate and rhythm with a normal S1 and S2. There were no murmurs, gallops or rubs.    Skin: Extremities are without rash or  edema.  Musculoskeletal:  Back is nontender  Neurologic Exam  Mental status: The patient is alert and oriented x 3 at the time of the examination. The patient has apparent normal recent and remote memory, with an apparently normal attention span and concentration ability.   Speech is normal.  Cranial nerves: Extraocular movements are full. Pupils are equal, round, and reactive to light and accomodation.  Visual fields are full.  Facial symmetry is present. There is good facial sensation to soft touch bilaterally.Facial strength is normal.  Trapezius and sternocleidomastoid strength is normal. No dysarthria is noted.  The tongue is midline, and the patient has symmetric elevation of the soft palate. No obvious hearing  deficits are noted.  Motor:  Muscle bulk is normal.   Tone is normal. Strength is  5 / 5 in all 4 extremities.   Sensory: He had Tinel sign at the left elbow.  He has reduced sensation to touch over the 3rd 4th and 5th finger in the left hand and the adjacent palm.  Coordination: Cerebellar testing reveals reduced left finger-nose-finger and reduced left heel-to-shin.  Gait and station: Station is normal.   Gait is mildly wide.  Tandem is poor.. Romberg is negative.   Reflexes: Deep tendon reflexes are symmetric in the arms but increased in the left leg relative to the right leg..   Plantar responses are flexor.    DIAGNOSTIC DATA (LABS, IMAGING, TESTING) - I reviewed patient records, labs, notes, testing and imaging myself where available.  Lab Results  Component Value Date   WBC 11.7 (H) 10/15/2019   HGB 16.2 10/15/2019   HCT 50.2 10/15/2019   MCV 88.8 10/15/2019   PLT 216 10/15/2019      Component Value Date/Time   NA 131 (L) 10/15/2019 0046   K 3.7 10/15/2019 0046   CL 93 (L) 10/15/2019 0046   CO2 26 10/15/2019 0046   GLUCOSE 380 (H) 10/15/2019 0046   BUN 14 10/15/2019 0046   CREATININE 1.10 10/15/2019 0728   CALCIUM 10.1 10/15/2019 0046  PROT 9.0 (H) 07/29/2018 2238   ALBUMIN 4.6 07/29/2018 2238   AST 29 07/29/2018 2238   ALT 40 07/29/2018 2238   ALKPHOS 101 07/29/2018 2238   BILITOT 0.6 07/29/2018 2238   GFRNONAA >60 10/15/2019 0728   GFRAA >60 10/15/2019 0728   Lab Results  Component Value Date   CHOL 709 (H) 10/15/2019   HDL 36 (L) 10/15/2019   LDLCALC UNABLE TO CALCULATE IF TRIGLYCERIDE OVER 400 mg/dL 10/15/2019   LDLDIRECT UNABLE TO CALCULATE IF TRIGLYCERIDE IS >1293 mg/dL 10/15/2019   TRIG 2,420 (H) 10/15/2019   CHOLHDL NOT REPORTED DUE TO HIGH TRIGLYCERIDES 10/15/2019   Lab Results  Component Value Date   HGBA1C 13.8 (H) 10/15/2019   No results found for: VITAMINB12 Lab Results  Component Value Date   TSH 83.261 (H) 10/15/2019        ASSESSMENT AND PLAN  Vertebral artery occlusion, right - Plan: MR BRAIN WO CONTRAST  Arm numbness left - Plan: MR BRAIN WO CONTRAST, MR CERVICAL SPINE WO CONTRAST, NCV with EMG(electromyography)  Left arm weakness - Plan: MR BRAIN WO CONTRAST, MR CERVICAL SPINE WO CONTRAST, NCV with EMG(electromyography)  Ulnar neuropathy of left upper extremity - Plan: NCV with EMG(electromyography)  History of embolic stroke - Plan: MR BRAIN WO CONTRAST   In summary, Mr. Recupero is a 49 year old man with a history of strokes due to left vertebral artery stenosis and right vertebral artery occlusion who has more recent left arm numbness and pain.  He does appear to have an ulnar neuropathy on the left and could also have a superimposed radiculopathy.  However, given his history of multiple strokes and having a stent, I feel we need to check an MRI of the brain and also an MRI of the cervical spine to further evaluate the cause of his symptoms.  I'll check an NCV/EMG to determine if there is a superimposed radiculopathy or mononeuropathy and determine if it is significant enough for surgery.  I will see him when he returns for the NCV/EMG study and further follow-up may be scheduled after that depending on results    Kweli Grassel A. Felecia Shelling, MD, [facility] 3/66/8159, 47:07 AM Certified in Neurology, Clinical Neurophysiology, Sleep Medicine and Neuroimaging  [facility] [address], [place], [place] [address] [phone]

## 2019-10-27 NOTE — Progress Notes (Deleted)
Cardiology Office Note  Date: 10/27/2019   ID: Devin [name], DOB 1970/09/10, MRN [id number]  PCP:  [facility]  Cardiologist:  Carlyle Dolly, MD Electrophysiologist:  None   Chief Complaint: CAD  History of Present Illness: Devin [name] is a 49 y.o. male with a history of CAD, chronic systolic heart failure, COPD, HLD, history of CVA.  Cardiac catheterization September 2019: LM patent, LAD mid 80 to 90%, LCx distal 90%, OM2 90%, L PDA 90%, RCA small or proximal 99%, LV gram 35 to 45%.  DES x2 to LAD, DES to OM.  Trifurcation lesion distal LCx, LPL and PDA recommendations for medical therapy.  Poor PCI target.  RCA small vessel treated medically.  Interventional cards recommended DAPT therapy  Echo 01/2018: EF 35 to 40%.  Grade 1 DD. NST October 31 1218 no clear ischemia LVEF 46%. Abnormal PFTs December 2020. 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 Later had ISR and repeat procedure 02/2018.  He presented to emergency department on 10/15/2019 with 3 days of chest pain.  He admitted he had not been taking any of his medications for at least 1 month.  He was admitted for observation.  His troponins were negative.  He was seen by cardiology. Had a 2D echocardiogram with noted improvement of his EF at 50 to 55% with no WMA's.  No further work-up was recommended.  He was advised to take his medications as directed  Past Medical History:  Diagnosis Date  . Asthma   . Chest pain   . CHF (congestive heart failure) ([place])   . Coronary artery disease    a. s/p DES x2 to LAD and DES to OM in 11/2017  . Depression   . Dyspnea   . GERD (gastroesophageal reflux disease)   . Headache   . History of kidney stones   . History of rhabdomyolysis   . Hyperlipidemia   . Hypertension   . Hypothyroidism   . Ischemic cardiomyopathy    a. 11/2017: echo showing EF of  35-40%, diffuse HK, and Grade 2 DD  . Kidney calculus 2014  . Pericardial effusion    Small, by dobutamine echocardiogram, 04/2006  . PONV (postoperative nausea and vomiting)   . Sleep apnea    does not wear cpap  . Stroke ([place])   . Tobacco abuse   . Type 2 diabetes mellitus without complications ([place]) 2/48/2500  . Vitamin D deficiency     Past Surgical History:  Procedure Laterality Date  . APPENDECTOMY    . 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 CHOLECYSTECTOMY N/A 01/27/2013   Procedure: LAPAROSCOPIC CHOLECYSTECTOMY;  Surgeon: Jamesetta So, MD;  Location: [facility];  Service: General;  Laterality: N/A;  . CORONARY STENT INTERVENTION N/A 11/15/2017   Procedure: CORONARY STENT INTERVENTION;  Surgeon: Leonie Man, MD;  Location: [facility];  Service: Cardiovascular;  Laterality: N/A;  . HERNIA REPAIR     As a child  . INSERTION OF MESH N/A 11/13/2012   Procedure: INSERTION OF MESH;  Surgeon: Jamesetta So, MD;  Location: [facility];  Service: General;  Laterality: N/A;  . IR ANGIO INTRA EXTRACRAN SEL COM CAROTID INNOMINATE BILAT MOD SED  12/01/2017  . IR ANGIO INTRA EXTRACRAN SEL COM CAROTID INNOMINATE BILAT MOD SED  02/13/2018  . IR  ANGIO VERTEBRAL SEL SUBCLAVIAN INNOMINATE UNI L MOD SED  02/13/2018  . IR ANGIO VERTEBRAL SEL VERTEBRAL BILAT MOD SED  12/01/2017  . IR ANGIO VERTEBRAL SEL VERTEBRAL UNI R MOD SED  02/13/2018  . IR INTRA CRAN STENT  12/03/2017  . IR PTA INTRACRANIAL  02/25/2018  . IR US GUIDE VASC ACCESS RIGHT  02/13/2018  . KIDNEY STONE SURGERY    . LEFT HEART CATH AND CORONARY ANGIOGRAPHY N/A 11/15/2017   Procedure: LEFT HEART CATH AND CORONARY ANGIOGRAPHY;  Surgeon: Leonie Man, MD;  Location: [facility];  Service: Cardiovascular;  Laterality: N/A;  . PERCUTANEOUS NEPHROLITHOTRIPSY    . RADIOLOGY WITH ANESTHESIA Left 12/03/2017   Procedure: Angioplasty with possible  stenting of left VBJ;  Surgeon: Luanne Bras, MD;  Location: [facility];  Service: Radiology;  Laterality: Left;  . RADIOLOGY WITH ANESTHESIA N/A 02/25/2018   Procedure: STENT PLACEMENT;  Surgeon: Luanne Bras, MD;  Location: [facility];  Service: Radiology;  Laterality: N/A;  . UMBILICAL HERNIA REPAIR N/A 11/13/2012   Procedure: UMBILICAL HERNIORRHAPHY;  Surgeon: Jamesetta So, MD;  Location: [facility];  Service: General;  Laterality: N/A;  . VARICOCELECTOMY      Current Outpatient Medications  Medication Sig Dispense Refill  . aspirin 81 MG EC tablet Take 1 tablet (81 mg total) by mouth daily. With food 30 tablet 0  . Blood Glucose Monitoring Suppl w/Device KIT Dispense based on patient and insurance preference. Check blood sugar daily before breakfast (ICD9 250.0) 1 kit 1  . clopidogrel (PLAVIX) 75 MG tablet Take 1 tablet (75 mg total) by mouth daily. 30 tablet 1  . DULERA 200-5 MCG/ACT AERO Inhale 2 puffs into the lungs 2 (two) times daily. 8.8 g 1  . EUTHYROX 100 MCG tablet Take 1 tablet (100 mcg total) by mouth daily. 30 tablet 1  . ezetimibe (ZETIA) 10 MG tablet Take 1 tablet (10 mg total) by mouth daily. 30 tablet 1  . famotidine (PEPCID) 20 MG tablet Take 20 mg by mouth 2 (two) times daily.    . fenofibrate (TRICOR) 145 MG tablet Take 1 tablet (145 mg total) by mouth daily. 30 tablet 1  . glimepiride (AMARYL) 2 MG tablet Take 1 tablet (2 mg total) by mouth daily with breakfast. 30 tablet 1  . losartan (COZAAR) 25 MG tablet Take 0.5 tablets (12.5 mg total) by mouth daily. 15 tablet 1  . nitroGLYCERIN (NITROSTAT) 0.4 MG SL tablet Place 1 tablet (0.4 mg total) under the tongue every 5 (five) minutes as needed for chest pain. 25 tablet 1  . sitaGLIPtin (JANUVIA) 100 MG tablet Take 1 tablet (100 mg total) by mouth daily. 30 tablet 1  . Vitamin D, Ergocalciferol, (DRISDOL) 1.25 MG (50000 UNIT) CAPS capsule Take 1 capsule (50,000 Units total) by mouth every 7 (seven) days. 8 capsule 0   No  current facility-administered medications for this visit.   Allergies:  Crestor [rosuvastatin], Trulicity [dulaglutide], and Bee venom   Social History: The patient  reports that he has been smoking cigarettes. He started smoking about 34 years ago. He has a 26.00 pack-year smoking history. He quit smokeless tobacco use about 32 years ago.  His smokeless tobacco use included snuff and chew. He reports that he does not drink alcohol and does not use drugs.   Family History: The patient's family history includes Arrhythmia in his father; Congestive Heart Failure (age of onset: 39) in his father; Coronary artery disease in his father, maternal grandfather,  and another family member; Diabetes in his father, mother, and sister; Hyperlipidemia in his father and mother; Hypertension in his father and mother; Obesity (age of onset: 25) in his mother; Stroke in his maternal aunt and maternal uncle.   ROS:  Please see the history of present illness. Otherwise, complete review of systems is positive for {NONE DEFAULTED:18576::"none"}.  All other systems are reviewed and negative.   Physical Exam: VS:  There were no vitals taken for this visit., BMI There is no height or weight on file to calculate BMI.  Wt Readings from Last 3 Encounters:  10/27/19 272 lb 8 oz (123.6 kg)  10/14/19 260 lb (117.9 kg)  07/28/19 260 lb (117.9 kg)    General: Patient appears comfortable at rest. HEENT: Conjunctiva and lids normal, oropharynx clear with moist mucosa. Neck: Supple, no elevated JVP or carotid bruits, no thyromegaly. Lungs: Clear to auscultation, nonlabored breathing at rest. Cardiac: Regular rate and rhythm, no S3 or significant systolic murmur, no pericardial rub. Abdomen: Soft, nontender, no hepatomegaly, bowel sounds present, no guarding or rebound. Extremities: No pitting edema, distal pulses 2+. Skin: Warm and dry. Musculoskeletal: No kyphosis. Neuropsychiatric: Alert and oriented x3, affect grossly  appropriate.  ECG:  {EKG/Telemetry Strips Reviewed:[phone]}  Recent Labwork: 10/15/2019: B Natriuretic Peptide 42.0; BUN 14; Creatinine, Ser 1.10; Hemoglobin 16.2; Platelets 216; Potassium 3.7; Sodium 131; TSH 83.261     Component Value Date/Time   CHOL 709 (H) 10/15/2019 0728   TRIG 2,420 (H) 10/15/2019 0728   HDL 36 (L) 10/15/2019 0728   CHOLHDL NOT REPORTED DUE TO HIGH TRIGLYCERIDES 10/15/2019 0728   VLDL UNABLE TO CALCULATE IF TRIGLYCERIDE OVER 400 mg/dL 10/15/2019 0728   LDLCALC UNABLE TO CALCULATE IF TRIGLYCERIDE OVER 400 mg/dL 10/15/2019 0728   LDLDIRECT UNABLE TO CALCULATE IF TRIGLYCERIDE IS >1293 mg/dL 10/15/2019 2751    Other Studies Reviewed Today:  Diagnostic Studies  Echocardiogram 10/15/2019  1. Left ventricular ejection fraction, by estimation, is 50 to 55%. The left ventricle has low normal function. The left ventricle has no regional wall motion abnormalities. There is moderate left ventricular hypertrophy. Left ventricular diastolic parameters are indeterminate. 2. Right ventricular systolic function is normal. The right ventricular size is normal. 3. The mitral valve is normal in structure. No evidence of mitral valve regurgitation. No evidence of mitral stenosis. 4. The aortic valve is tricuspid. Aortic valve regurgitation is not visualized. No aortic stenosis is present. 5. The inferior vena cava is normal in size with greater than 50% respiratory variability, suggesting right atrial pressure of 3 mmHg.  Echocardiogram 02/09/2018 Study Conclusions   - Left ventricle: The cavity size was normal. There was mild  concentric hypertrophy. Systolic function was moderately reduced.  The estimated ejection fraction was in the range of 35% to 40%.  Diffuse hypokinesis. Doppler parameters are consistent with  abnormal left ventricular relaxation (grade 1 diastolic  dysfunction).  - Mitral valve: There was mild regurgitation.  - Pericardium,  extracardiac: A trivial pericardial effusion was  identified along the right ventricular free wall.   Impressions:   - Compared to the prior study, there has been no significant  interval change   11/2017 cath  There is moderate left ventricular systolic dysfunction. The left ventricular ejection fraction is 35-45% by visual estimate.  LV end diastolic pressure is mildly elevated.  -----------------------------------------  ANGIOGRAPHY- PCI  LESION #1: Mid LAD lesion is 80% stenosed.  A drug-eluting stent was successfully placed using a STENT SYNERGY DES 2.25X16. -Postdilated to 2.4  mm  Post intervention, there is a 0% residual stenosis.  LESION #2: mid LAD to Dist LAD lesion is 90% stenosed.  A drug-eluting stent was successfully placed using a STENT SYNERGY DES 2.25X12. -Postdilated to 2.4 mm  Post intervention, there is a 0% residual stenosis.  Prox LAD lesion is 40% stenosed -prior to lesion 1; apical, after lesion 2  A drug-eluting stent was successfully placed using a STENT SYNERGY DES 2.25X12. -Postdilated to 2.4 mm  -----------------------------------------  Post intervention, there is a 0% residual stenosis.  Trifurcation 90% lesion at the distal circumflex into LPL and PDA -small caliber distal vessels. Best treated medically as this is not a very good PCI target  Prox small caliber nondominant RCA lesion is 99% stenosed.  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  Subtotally occluded nondominant RCA Moderately reduced LVEF of roughly 40% with mildly elevated LVEDP.  Plan: Transfer to postprocedure unit for post PCI monitoring and TR removal Continue aggressive risk factor modification with statin and beta-blocker etc.  Recommend uninterrupted dual antiplatelet therapy with Aspirin 86m daily and Ticagrelor 960m twice dailyfor a minimum of 12 months (ACS - Class I recommendation).-Would strongly consider reducing to 60 mg dose ticagrelor at 9-12 months and continue for an additional year.  Would be okay to stop aspirin after 3 months if necessary. Okay to stop Brilinta after 6 months for procedures if necessary.   10/2018 nuclear stress  There was no ST segment deviation noted during stress.  Defect 1: There is a small defect of mild severity present in the mid inferoseptal location. This is likely due to soft tissue attenuation artifact.  This is a low risk study. No ischemic zones.  Nuclear stress EF: 46    Assessment and Plan:  1. CAD in native artery   2. Ischemic cardiomyopathy   3. Dyspnea, unspecified type   4. Hyperlipidemia LDL goal <70   5. History of CVA (cerebrovascular accident)    1. CAD in native artery ***  2. Ischemic cardiomyopathy ***  3. Dyspnea, unspecified type ***  4. Hyperlipidemia LDL goal <70 ***  5. History of CVA (cerebrovascular accident) ***  Medication Adjustments/Labs and Tests Ordered: Current medicines are reviewed at length with the patient today.  Concerns regarding medicines are outlined above.   Disposition: Follow-up with ***  Signed, AnLevell JulyNP 10/27/2019 11:50 PM    [facility]1[place] [id number]: [phone]: [phone]

## 2019-10-28 ENCOUNTER — Telehealth: Payer: Self-pay | Admitting: Neurology

## 2019-10-28 ENCOUNTER — Ambulatory Visit: Payer: Medicaid Other | Admitting: Family Medicine

## 2019-10-28 NOTE — Telephone Encounter (Signed)
[facility]  [facility] pending uploaded notes

## 2019-11-03 NOTE — Telephone Encounter (Signed)
[facility] Berkley Harvey: [id number] (exp. 10/28/19 to 11/28/19) patient is scheduled at [facility] for 11/18/19 arrival time is 3:30 PM. Patient sister Lynden Ang on the [place] is aware of time and day. I also gave her their number of [phone] incase she wanted to r/s.

## 2019-11-18 ENCOUNTER — Ambulatory Visit (HOSPITAL_COMMUNITY)
Admission: RE | Admit: 2019-11-18 | Discharge: 2019-11-18 | Disposition: A | Discharge status: Home / Self Care | Payer: Medicaid Other | Source: Ambulatory Visit | Attending: Neurology | Admitting: Neurology

## 2019-11-18 ENCOUNTER — Other Ambulatory Visit: Payer: Self-pay

## 2019-11-18 DIAGNOSIS — I6501 Occlusion and stenosis of right vertebral artery: Secondary | ICD-10-CM

## 2019-11-18 DIAGNOSIS — Z8673 Personal history of transient ischemic attack (TIA), and cerebral infarction without residual deficits: Secondary | ICD-10-CM | POA: Diagnosis present

## 2019-11-18 DIAGNOSIS — R200 Anesthesia of skin: Secondary | ICD-10-CM | POA: Insufficient documentation

## 2019-11-18 DIAGNOSIS — R29898 Other symptoms and signs involving the musculoskeletal system: Secondary | ICD-10-CM

## 2019-11-19 NOTE — Progress Notes (Signed)
Cardiology Office Note  Date: 11/20/2019   ID: BALEN WOOLUM, DOB 07-29-1970, MRN [id number]  PCP:  [facility]  Cardiologist:  Carlyle Dolly, MD Electrophysiologist:  None   Chief Complaint: CAD  History of Present Illness: CHEICK SUHR is a 49 y.o. male with a history of CAD, chronic systolic heart failure, COPD, HLD, history of CVA.  Cardiac catheterization September 2019: LM patent, LAD mid 80 to 90%, LCx distal 90%, OM2 90%, L PDA 90%, RCA small or proximal 99%, LV gram 35 to 45%.  DES x2 to LAD, DES to OM.  Trifurcation lesion distal LCx, LPL and PDA recommendations for medical therapy.  Poor PCI target.  RCA small vessel treated medically.  Interventional cards recommended DAPT therapy  Echo 01/2018: EF 35 to 40%.  Grade 1 DD. NST October 31 1218 no clear ischemia LVEF 46%. Abnormal PFTs December 2020. 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 Later had ISR and repeat procedure 02/2018.  He presented to emergency department on 10/15/2019 with 3 days of chest pain.  He admitted he had not been taking any of his medications for at least 1 month.  He was admitted for observation.  His troponins were negative.  He was seen by cardiology. Had a 2D echocardiogram with noted improvement of his EF at 50 to 55% with no WMA's.  No further work-up was recommended.  He was advised to take his medications as directed.   He presents today with no particular complaints except for ongoing left neck, arm, axillary pain with numbness and tingling down left arm to fingers.  He states he recently had some imaging done of his neck.  States he seen a neurologist recently and the neurologist believes that the left-sided chest pain, left arm pain and numbness and tingling in hands and fingers are likely due to nerve impingement and issues in his left elbow which may be contributing  to the numbness and tingling.  He denies any classic anginal symptoms.  He describes  the pain in his left upper chest as originating in the axillary area sometimes radiation to left upper precordial region also neck pain radiating down left arm with numbness and tingling in left hand.  Denies any associated radiation from chest to neck, back, jaw.  Denies any associated nausea, vomiting, or diaphoresis denies any dizziness.  Denies any palpitations or arrhythmias, orthostatic symptoms, CVA or TIA-like symptoms, no bleeding in stool or urine.  No claudication-like symptoms, DVT or PE-like symptoms, or lower extremity edema.  Continues to smoke.  Has a loose cough.  He states he does have COPD and knows he needs to stop smoking.  Currently not motivated to stop.  Past Medical History:  Diagnosis Date   Asthma    Chest pain    CHF (congestive heart failure) ([place])    Coronary artery disease    a. s/p DES x2 to LAD and DES to OM in 11/2017   Depression    Dyspnea    GERD (gastroesophageal reflux disease)    Headache    History of kidney stones    History of rhabdomyolysis    Hyperlipidemia    Hypertension    Hypothyroidism    Ischemic cardiomyopathy    a. 11/2017: echo showing EF of 35-40%, diffuse HK, and Grade 2 DD   Kidney calculus 2014   Pericardial effusion    Small, by dobutamine echocardiogram, 04/2006  PONV (postoperative nausea and vomiting)    Sleep apnea    does not wear cpap   Stroke ([place])    Tobacco abuse    Type 2 diabetes mellitus without complications ([place]) 7/32/2025   Vitamin D deficiency     Past Surgical History:  Procedure Laterality Date   APPENDECTOMY     CARDIAC CATHETERIZATION  09/2012   "Nonobstructive CAD with 30% proximal, 40% mid LAD disease; 30% proximal CFX; EF 55-65%"   CHOLECYSTECTOMY N/A 01/27/2013   Procedure: LAPAROSCOPIC CHOLECYSTECTOMY;  Surgeon: Jamesetta So, MD;  Location: [facility];  Service: General;  Laterality: N/A;     CORONARY STENT INTERVENTION N/A 11/15/2017   Procedure: CORONARY STENT INTERVENTION;  Surgeon: Leonie Man, MD;  Location: [facility];  Service: Cardiovascular;  Laterality: N/A;   HERNIA REPAIR     As a child   INSERTION OF MESH N/A 11/13/2012   Procedure: INSERTION OF MESH;  Surgeon: Jamesetta So, MD;  Location: [facility];  Service: General;  Laterality: N/A;   IR ANGIO INTRA EXTRACRAN SEL COM CAROTID INNOMINATE BILAT MOD SED  12/01/2017   IR ANGIO INTRA EXTRACRAN SEL COM CAROTID INNOMINATE BILAT MOD SED  02/13/2018   IR ANGIO VERTEBRAL SEL SUBCLAVIAN INNOMINATE UNI L MOD SED  02/13/2018   IR ANGIO VERTEBRAL SEL VERTEBRAL BILAT MOD SED  12/01/2017   IR ANGIO VERTEBRAL SEL VERTEBRAL UNI R MOD SED  02/13/2018   IR INTRA CRAN STENT  12/03/2017   IR PTA INTRACRANIAL  02/25/2018   IR US GUIDE VASC ACCESS RIGHT  02/13/2018   KIDNEY STONE SURGERY     LEFT HEART CATH AND CORONARY ANGIOGRAPHY N/A 11/15/2017   Procedure: LEFT HEART CATH AND CORONARY ANGIOGRAPHY;  Surgeon: Leonie Man, MD;  Location: [facility];  Service: Cardiovascular;  Laterality: N/A;   PERCUTANEOUS NEPHROLITHOTRIPSY     RADIOLOGY WITH ANESTHESIA Left 12/03/2017   Procedure: Angioplasty with possible stenting of left VBJ;  Surgeon: Luanne Bras, MD;  Location: [facility];  Service: Radiology;  Laterality: Left;   RADIOLOGY WITH ANESTHESIA N/A 02/25/2018   Procedure: STENT PLACEMENT;  Surgeon: Luanne Bras, MD;  Location: [facility];  Service: Radiology;  Laterality: N/A;   UMBILICAL HERNIA REPAIR N/A 11/13/2012   Procedure: UMBILICAL HERNIORRHAPHY;  Surgeon: Jamesetta So, MD;  Location: [facility];  Service: General;  Laterality: N/A;   VARICOCELECTOMY      Current Outpatient Medications  Medication Sig Dispense Refill   aspirin 81 MG EC tablet Take 1 tablet (81 mg total) by mouth daily. With food 30 tablet 0   Blood Glucose Monitoring Suppl w/Device KIT Dispense based on patient and insurance  preference. Check blood sugar daily before breakfast (ICD9 250.0) 1 kit 1   clopidogrel (PLAVIX) 75 MG tablet Take 1 tablet (75 mg total) by mouth daily. 30 tablet 1   DULERA 200-5 MCG/ACT AERO Inhale 2 puffs into the lungs 2 (two) times daily. 8.8 g 1   EUTHYROX 100 MCG tablet Take 1 tablet (100 mcg total) by mouth daily. 30 tablet 1   ezetimibe (ZETIA) 10 MG tablet Take 1 tablet (10 mg total) by mouth daily. 30 tablet 1   famotidine (PEPCID) 20 MG tablet Take 20 mg by mouth 2 (two) times daily.     fenofibrate (TRICOR) 145 MG tablet Take 1 tablet (145 mg total) by mouth daily. 30 tablet 1   glimepiride (AMARYL) 2 MG tablet Take 1 tablet (2 mg total) by  mouth daily with breakfast. 30 tablet 1   losartan (COZAAR) 25 MG tablet Take 0.5 tablets (12.5 mg total) by mouth daily. 15 tablet 1   nitroGLYCERIN (NITROSTAT) 0.4 MG SL tablet Place 1 tablet (0.4 mg total) under the tongue every 5 (five) minutes as needed for chest pain. 25 tablet 1   sitaGLIPtin (JANUVIA) 100 MG tablet Take 1 tablet (100 mg total) by mouth daily. 30 tablet 1   Vitamin D, Ergocalciferol, (DRISDOL) 1.25 MG (50000 UNIT) CAPS capsule Take 1 capsule (50,000 Units total) by mouth every 7 (seven) days. 8 capsule 0   No current facility-administered medications for this visit.   Allergies:  Crestor [rosuvastatin], Trulicity [dulaglutide], and Bee venom   Social History: The patient  reports that he has been smoking cigarettes. He started smoking about 34 years ago. He has a 26.00 pack-year smoking history. He quit smokeless tobacco use about 32 years ago.  His smokeless tobacco use included snuff and chew. He reports that he does not drink alcohol and does not use drugs.   Family History: The patient's family history includes Arrhythmia in his father; Congestive Heart Failure (age of onset: 52) in his father; Coronary artery disease in his father, maternal grandfather, and another family member; Diabetes in his father,  mother, and sister; Hyperlipidemia in his father and mother; Hypertension in his father and mother; Obesity (age of onset: 67) in his mother; Stroke in his maternal aunt and maternal uncle.   ROS:  Please see the history of present illness. Otherwise, complete review of systems is positive for none.  All other systems are reviewed and negative.   Physical Exam: VS:  BP (!) 126/94    Pulse 82    Ht 6' (1.829 m)    Wt 279 lb 4.8 oz (126.7 kg)    SpO2 98%    BMI 37.88 kg/m , BMI Body mass index is 37.88 kg/m.  Wt Readings from Last 3 Encounters:  11/20/19 279 lb 4.8 oz (126.7 kg)  10/27/19 272 lb 8 oz (123.6 kg)  10/14/19 260 lb (117.9 kg)    General: Patient appears comfortable at rest. Neck: Supple, no elevated JVP or carotid bruits, no thyromegaly. Lungs: Clear to auscultation, nonlabored breathing at rest. Cardiac: Regular rate and rhythm, no S3 or significant systolic murmur, no pericardial rub. Extremities: No pitting edema, distal pulses 2+. Skin: Warm and dry. Musculoskeletal: No kyphosis. Neuropsychiatric: Alert and oriented x3, affect grossly appropriate.  ECG:  EKG on October 14, 2019 showed normal sinus rhythm rate of 67, incomplete right bundle branch block, left posterior fascicular block, T wave abnormality.  Consider inferior ischemia.  Recent Labwork: 10/15/2019: B Natriuretic Peptide 42.0; BUN 14; Creatinine, Ser 1.10; Hemoglobin 16.2; Platelets 216; Potassium 3.7; Sodium 131; TSH 83.261     Component Value Date/Time   CHOL 709 (H) 10/15/2019 0728   TRIG 2,420 (H) 10/15/2019 0728   HDL 36 (L) 10/15/2019 0728   CHOLHDL NOT REPORTED DUE TO HIGH TRIGLYCERIDES 10/15/2019 0728   VLDL UNABLE TO CALCULATE IF TRIGLYCERIDE OVER 400 mg/dL 10/15/2019 0728   LDLCALC UNABLE TO CALCULATE IF TRIGLYCERIDE OVER 400 mg/dL 10/15/2019 0728   LDLDIRECT UNABLE TO CALCULATE IF TRIGLYCERIDE IS >1293 mg/dL 10/15/2019 7579    Other Studies Reviewed Today:  Diagnostic  Studies  Echocardiogram 10/15/2019  1. Left ventricular ejection fraction, by estimation, is 50 to 55%. The left ventricle has low normal function. The left ventricle has no regional wall motion abnormalities. There is moderate left ventricular  hypertrophy. Left ventricular diastolic parameters are indeterminate. 2. Right ventricular systolic function is normal. The right ventricular size is normal. 3. The mitral valve is normal in structure. No evidence of mitral valve regurgitation. No evidence of mitral stenosis. 4. The aortic valve is tricuspid. Aortic valve regurgitation is not visualized. No aortic stenosis is present. 5. The inferior vena cava is normal in size with greater than 50% respiratory variability, suggesting right atrial pressure of 3 mmHg.  Echocardiogram 02/09/2018 Study Conclusions   - Left ventricle: The cavity size was normal. There was mild  concentric hypertrophy. Systolic function was moderately reduced.  The estimated ejection fraction was in the range of 35% to 40%.  Diffuse hypokinesis. Doppler parameters are consistent with  abnormal left ventricular relaxation (grade 1 diastolic  dysfunction).  - Mitral valve: There was mild regurgitation.  - Pericardium, extracardiac: A trivial pericardial effusion was  identified along the right ventricular free wall.   Impressions:   - Compared to the prior study, there has been no significant  interval change   11/2017 cath  There is moderate left ventricular systolic dysfunction. The left ventricular ejection fraction is 35-45% by visual estimate.  LV end diastolic pressure is mildly elevated.  -----------------------------------------  ANGIOGRAPHY- PCI  LESION #1: Mid LAD lesion is 80% stenosed.  A drug-eluting stent was successfully placed using a STENT SYNERGY DES 2.25X16. -Postdilated to 2.4 mm  Post intervention, there is a 0% residual stenosis.  LESION #2: mid LAD to Dist LAD  lesion is 90% stenosed.  A drug-eluting stent was successfully placed using a STENT SYNERGY DES 2.25X12. -Postdilated to 2.4 mm  Post intervention, there is a 0% residual stenosis.  Prox LAD lesion is 40% stenosed -prior to lesion 1; apical, after lesion 2  A drug-eluting stent was successfully placed using a STENT SYNERGY DES 2.25X12. -Postdilated to 2.4 mm  -----------------------------------------  Post intervention, there is a 0% residual stenosis.  Trifurcation 90% lesion at the distal circumflex into LPL and PDA -small caliber distal vessels. Best treated medically as this is not a very good PCI target  Prox small caliber nondominant RCA lesion is 99% stenosed.  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  Subtotally occluded nondominant RCA Moderately reduced LVEF of roughly 40% with mildly elevated LVEDP.  Plan: Transfer to postprocedure unit for post PCI monitoring and TR removal Continue aggressive risk factor modification with statin and beta-blocker etc.  Recommend uninterrupted dual antiplatelet therapy with Aspirin 58m daily and Ticagrelor 971mtwice dailyfor a minimum of 12 months (ACS - Class I recommendation).-Would strongly consider reducing to 60 mg dose ticagrelor at 9-12 months and continue for an additional year.  Would be okay to stop aspirin after 3 months if necessary. Okay to stop Brilinta after 6 months for procedures if necessary.   10/2018 nuclear stress  There was no ST segment deviation noted during stress.  Defect 1: There is a small defect of mild severity present in the mid inferoseptal location. This is likely due to soft tissue attenuation artifact.  This is a low risk study. No ischemic zones.  Nuclear stress EF: 46    Assessment and Plan:   1. CAD in native artery Cardiac catheterization  September 2019: LM patent, LAD mid 80 to 90%, LCx distal 90%, OM2 90%, L PDA 90%, RCA small or proximal 99%, LV  gram 35 to 45%.  DES x2 to LAD, DES to OM.  Trifurcation lesion distal LCx, LPL and PDA recommendations for medical therapy.  Poor PCI target.  RCA small vessel treated medically.  Interventional cards recommended DAPT therapy.  Patient denies any specific anginal pain or exertional symptoms.  No associated radiation or nausea, vomiting, or diaphoresis.  Continue aspirin 81 mg daily, Plavix 75 mg daily.   2. Ischemic cardiomyopathy Recent echocardiogram on 10/15/2019 demonstrated EF of 50 to 55% No WMA's.  Moderate LVH. Comparison with previous study performed on November 2019 when EF was 35 to 40%, G1 DD with mild MR  3. Dyspnea, unspecified type Patient has mild to moderate dyspnea on moderate activity.  He is a long-term smoker and has COPD.  He continues to smoke.  He is finding it hard to quit.  4. Hyperlipidemia LDL goal <70/hypertriglyceridemia. Patient states he is intolerant of statins.  He continues on Zetia 10 mg daily as well as fenofibrate 145 mg daily.  Recent labs on 10/15/2019 showed total cholesterol 709, triglycerides 2420, HDL 36.  LDL was unable to be calculated.  Please refer to lipid clinic for possible placement on PCSK9 inhibitors due to statin intolerance.   5. History of CVA (cerebrovascular accident) Patient states history of multiple strokes affecting the left side.  States he has some issues with walking as a result.  No other focal neurologic deficits noted on exam.  Continue aspirin 81 mg daily.  Plavix 75 mg p.o. daily.  Zetia 10 mg daily, fenofibrate 145 mg p.o. daily.  6.  Smoker He continues to smoke.  Highly advised him he needs to stop.  He states attempting to stop smoking is probably the hardest thing he has ever tried to do in his life.  Encouraged him to do the best he could.  When he felt like he could cut down I encouraged him to try.  7.  Chest  pain unspecified  Patient denies any specific anginal pain.  States he recently saw neurology and had imaging performed and the left-sided chest pain seems to be stemming from possible nerve impingement.  States he had the recent imaging this morning and does not have the results.  He states his neurologist states the nerve at his elbow may be affecting the numbness and tingling in his fingers.  He does have neck pain and left axillary/left precordial pain which seems to radiate down the arm not associated with exertion and does not originate in substernal area.  He denies any associated nausea, vomiting, ordiaphoresis when this occurs.  Denies any association with exertion.  The results of imaging are pending.  8.  Hypothyroidism Recent thyroid profile on 10/15/2019 showed TSH of 83.261.  Apparently he had stopped taking his thyroid medication.  He is now taking Euthyrox 100 mcg daily.  He is following with Dr. Dorris Fetch in [place] for his hypothyroidism and diabetes.  9.  Hypertension Blood pressure today is 126/94.  Continue losartan 12.5 mg daily.  May need uptitrate in the future.  We will continue to monitor  Medication Adjustments/Labs and Tests Ordered: Current medicines are reviewed at length with the patient today.  Concerns regarding medicines are outlined above.   Disposition: Follow-up with Dr. Harl Bowie or APP 6 months signed, Levell July, NP 11/20/2019 12:06 PM    [facility] [place], [place], [place] [address] Phone: [phone]; Fax: [phone]

## 2019-11-20 ENCOUNTER — Ambulatory Visit (INDEPENDENT_AMBULATORY_CARE_PROVIDER_SITE_OTHER): Payer: Medicaid Other | Admitting: Family Medicine

## 2019-11-20 ENCOUNTER — Encounter: Payer: Self-pay | Admitting: Family Medicine

## 2019-11-20 ENCOUNTER — Other Ambulatory Visit: Payer: Self-pay

## 2019-11-20 VITALS — BP 126/94 | HR 82 | Ht 72.0 in | Wt 279.3 lb

## 2019-11-20 DIAGNOSIS — I255 Ischemic cardiomyopathy: Secondary | ICD-10-CM | POA: Diagnosis not present

## 2019-11-20 DIAGNOSIS — I10 Essential (primary) hypertension: Secondary | ICD-10-CM

## 2019-11-20 DIAGNOSIS — R0600 Dyspnea, unspecified: Secondary | ICD-10-CM | POA: Diagnosis not present

## 2019-11-20 DIAGNOSIS — F17200 Nicotine dependence, unspecified, uncomplicated: Secondary | ICD-10-CM

## 2019-11-20 DIAGNOSIS — I2510 Atherosclerotic heart disease of native coronary artery without angina pectoris: Secondary | ICD-10-CM

## 2019-11-20 DIAGNOSIS — E039 Hypothyroidism, unspecified: Secondary | ICD-10-CM

## 2019-11-20 DIAGNOSIS — R0789 Other chest pain: Secondary | ICD-10-CM

## 2019-11-20 DIAGNOSIS — E785 Hyperlipidemia, unspecified: Secondary | ICD-10-CM | POA: Diagnosis not present

## 2019-11-20 DIAGNOSIS — Z8673 Personal history of transient ischemic attack (TIA), and cerebral infarction without residual deficits: Secondary | ICD-10-CM

## 2019-11-20 NOTE — Patient Instructions (Addendum)
Medication Instructions:   Your physician recommends that you continue on your current medications as directed. Please refer to the Current Medication list given to you today.  Labwork:  None  Testing/Procedures:  None  Follow-Up:  Your physician recommends that you schedule a follow-up appointment in: 6 months.  Any Other Special Instructions Will Be Listed Below (If Applicable).  You have been referred to the Lipid Clinic.  If you need a refill on your cardiac medications before your next appointment, please call your pharmacy.

## 2019-12-11 ENCOUNTER — Encounter: Payer: Self-pay | Admitting: Nutrition

## 2019-12-11 ENCOUNTER — Encounter: Payer: Self-pay | Admitting: "Endocrinology

## 2019-12-11 ENCOUNTER — Other Ambulatory Visit: Payer: Self-pay

## 2019-12-11 ENCOUNTER — Ambulatory Visit (INDEPENDENT_AMBULATORY_CARE_PROVIDER_SITE_OTHER): Payer: Medicaid Other | Admitting: "Endocrinology

## 2019-12-11 ENCOUNTER — Encounter: Payer: Medicaid Other | Attending: Internal Medicine | Admitting: Nutrition

## 2019-12-11 VITALS — BP 114/70 | HR 73 | Ht 77.0 in | Wt 280.0 lb

## 2019-12-11 VITALS — Ht 78.0 in | Wt 280.0 lb

## 2019-12-11 DIAGNOSIS — I639 Cerebral infarction, unspecified: Secondary | ICD-10-CM | POA: Diagnosis not present

## 2019-12-11 DIAGNOSIS — Z789 Other specified health status: Secondary | ICD-10-CM | POA: Insufficient documentation

## 2019-12-11 DIAGNOSIS — I10 Essential (primary) hypertension: Secondary | ICD-10-CM

## 2019-12-11 DIAGNOSIS — E1165 Type 2 diabetes mellitus with hyperglycemia: Secondary | ICD-10-CM | POA: Diagnosis present

## 2019-12-11 DIAGNOSIS — F17210 Nicotine dependence, cigarettes, uncomplicated: Secondary | ICD-10-CM

## 2019-12-11 DIAGNOSIS — E782 Mixed hyperlipidemia: Secondary | ICD-10-CM | POA: Insufficient documentation

## 2019-12-11 DIAGNOSIS — E1159 Type 2 diabetes mellitus with other circulatory complications: Secondary | ICD-10-CM

## 2019-12-11 DIAGNOSIS — E038 Other specified hypothyroidism: Secondary | ICD-10-CM | POA: Diagnosis not present

## 2019-12-11 DIAGNOSIS — Z6833 Body mass index (BMI) 33.0-33.9, adult: Secondary | ICD-10-CM | POA: Diagnosis not present

## 2019-12-11 DIAGNOSIS — I2510 Atherosclerotic heart disease of native coronary artery without angina pectoris: Secondary | ICD-10-CM | POA: Insufficient documentation

## 2019-12-11 DIAGNOSIS — E118 Type 2 diabetes mellitus with unspecified complications: Secondary | ICD-10-CM | POA: Insufficient documentation

## 2019-12-11 DIAGNOSIS — E559 Vitamin D deficiency, unspecified: Secondary | ICD-10-CM

## 2019-12-11 DIAGNOSIS — IMO0002 Reserved for concepts with insufficient information to code with codable children: Secondary | ICD-10-CM

## 2019-12-11 MED ORDER — GLIPIZIDE ER 5 MG PO TB24: 5.0000 mg | ORAL_TABLET | Freq: Every day | ORAL | 3 refills | Status: DC

## 2019-12-11 NOTE — Progress Notes (Signed)
  Medical Nutrition Therapy:  Appt start time: 1300 end time:  1400.   Assessment:  Primary concerns today: Diabetes Type 2 x 4 yrs. Sees [facility]. Dr. Mayford Knife PCP. Sees Dr. Fransico Him today as new pt. .  Had a CABG and had series of strokes.  Was in [facility] for chest pains a few months. Has issues with depression. Has difficulty reading due to strokes. BS are ranging250-350's.  Glimepiride, Januvua  Lantus 15 units at night.  He is scheduled to see Dr. Fransico Him this afternoon also.   Lab Results  Component Value Date   HGBA1C 13.8 (H) 10/15/2019     Preferred Learning Style:  No preference indicated   Learning Readiness:   Ready  Change in progress   MEDICATIONS:    DIETARY INTAKE:   24-hr recall:  2 eggs, sausage and 2 biscuits, milk-2% 8 oz    Usual physical activity:   Estimated energy needs: 1800 calories 200 g carbohydrates 135 g protein 50 g fat  Progress Towards Goal(s):  In progress.   Nutritional Diagnosis:  NB-1.1 Food and nutrition-related knowledge deficit As related to Diabetes Type 2.  As evidenced by A1C 13.8.    Intervention:  Nutrition and Diabetes education provided on My Plate, CHO counting, meal planning, portion sizes, timing of meals, avoiding snacks between meals unless having a low blood sugar, target ranges for A1C and blood sugars, signs/symptoms and treatment of hyper/hypoglycemia, monitoring blood sugars, taking medications as prescribed, benefits of exercising 30 minutes per day and prevention of complications of DM.  Marland KitchenGoals  Follow MY Plate Eat 13-08 g CHO at each meal Eat meals on time Drink only water  Test blood sugars 4 times per day Bring meter and logs with you next visit. Walk 30 minutes or more a day Get Am BS below 150 and bedtime below 200 is short term goal Increase lower carb vegetables Cut out fast food and processed foods.  Teaching Method Utilized:  Visual Auditory Hands on  Handouts given  during visit include:  The Plate Method    Meal Plan Card  Barriers to learning/adherence to lifestyle change: none  Demonstrated degree of understanding via:  Teach Back   Monitoring/Evaluation:  Dietary intake, exercise,  and body weight in 1 week(s).

## 2019-12-11 NOTE — Patient Instructions (Signed)

## 2019-12-11 NOTE — Progress Notes (Signed)
Endocrinology Consult Note       12/11/2019, 9:43 PM   Subjective:    Patient ID: Devin [name], male    DOB: 1970-09-29.  Devin [name] is being seen in consultation for management of currently uncontrolled symptomatic diabetes requested by  Comer Nation, MD.   Past Medical History:  Diagnosis Date  . Asthma   . Chest pain   . CHF (congestive heart failure) ([place])   . Coronary artery disease    a. s/p DES x2 to LAD and DES to OM in 11/2017  . Depression   . Dyspnea   . GERD (gastroesophageal reflux disease)   . Headache   . History of kidney stones   . History of rhabdomyolysis   . Hyperlipidemia   . Hypertension   . Hypothyroidism   . Ischemic cardiomyopathy    a. 11/2017: echo showing EF of 35-40%, diffuse HK, and Grade 2 DD  . Kidney calculus 2014  . Pericardial effusion    Small, by dobutamine echocardiogram, 04/2006  . PONV (postoperative nausea and vomiting)   . Sleep apnea    does not wear cpap  . Stroke ([place])   . Tobacco abuse   . Type 2 diabetes mellitus without complications ([place]) 10/12/6551  . Vitamin D deficiency     Past Surgical History:  Procedure Laterality Date  . APPENDECTOMY    . 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 CHOLECYSTECTOMY N/A 01/27/2013   Procedure: LAPAROSCOPIC CHOLECYSTECTOMY;  Surgeon: Jamesetta So, MD;  Location: [facility];  Service: General;  Laterality: N/A;  . CORONARY STENT INTERVENTION N/A 11/15/2017   Procedure: CORONARY STENT INTERVENTION;  Surgeon: Leonie Man, MD;  Location: [facility];  Service: Cardiovascular;  Laterality: N/A;  . HERNIA REPAIR     As a child  . INSERTION OF MESH N/A 11/13/2012   Procedure: INSERTION OF MESH;  Surgeon: Jamesetta So, MD;  Location: [facility];  Service: General;  Laterality: N/A;  . IR ANGIO INTRA EXTRACRAN  SEL COM CAROTID INNOMINATE BILAT MOD SED  12/01/2017  . IR ANGIO INTRA EXTRACRAN SEL COM CAROTID INNOMINATE BILAT MOD SED  02/13/2018  . IR ANGIO VERTEBRAL SEL SUBCLAVIAN INNOMINATE UNI L MOD SED  02/13/2018  . IR ANGIO VERTEBRAL SEL VERTEBRAL BILAT MOD SED  12/01/2017  . IR ANGIO VERTEBRAL SEL VERTEBRAL UNI R MOD SED  02/13/2018  . IR INTRA CRAN STENT  12/03/2017  . IR PTA INTRACRANIAL  02/25/2018  . IR US GUIDE VASC ACCESS RIGHT  02/13/2018  . KIDNEY STONE SURGERY    . LEFT HEART CATH AND CORONARY ANGIOGRAPHY N/A 11/15/2017   Procedure: LEFT HEART CATH AND CORONARY ANGIOGRAPHY;  Surgeon: Leonie Man, MD;  Location: [facility];  Service: Cardiovascular;  Laterality: N/A;  . PERCUTANEOUS NEPHROLITHOTRIPSY    . RADIOLOGY WITH ANESTHESIA Left 12/03/2017   Procedure: Angioplasty with possible stenting of left VBJ;  Surgeon: Luanne Bras, MD;  Location: [facility];  Service: Radiology;  Laterality: Left;  . RADIOLOGY WITH  ANESTHESIA N/A 02/25/2018   Procedure: STENT PLACEMENT;  Surgeon: Luanne Bras, MD;  Location: [facility];  Service: Radiology;  Laterality: N/A;  . UMBILICAL HERNIA REPAIR N/A 11/13/2012   Procedure: UMBILICAL HERNIORRHAPHY;  Surgeon: Jamesetta So, MD;  Location: [facility];  Service: General;  Laterality: N/A;  . VARICOCELECTOMY      Social History   Socioeconomic History  . Marital status: Divorced    Spouse name: Not on file  . Number of children: Not on file  . Years of education: Not on file  . Highest education level: Not on file  Occupational History  . Occupation: Disability     Comment:      Employer: [employer]  Tobacco Use  . Smoking status: Current Every Day Smoker    Packs/day: 1.00    Years: 26.00    Pack years: 26.00    Types: Cigarettes    Start date: 03/13/1985  . Smokeless tobacco: Former Systems developer    Types: Snuff, Sarina Ser    Quit date: 03/14/1987  Vaping Use  . Vaping Use: Former  . Devices: quit in 2011  Substance and Sexual Activity  . Alcohol  use: No  . Drug use: No  . Sexual activity: Not on file  Other Topics Concern  . Not on file  Social History Narrative   Eats fast food daily   No exercise outside of work   Lives in [place] with his dog   MAINTENANCE TECHNICIAN   Caffeine use: tea/soda daily   Left handed    Social Determinants of Health   Financial Resource Strain:   . Difficulty of Paying Living Expenses: Not on file  Food Insecurity:   . Worried About Charity fundraiser in the Last Year: Not on file  . Ran Out of Food in the Last Year: Not on file  Transportation Needs:   . Lack of Transportation (Medical): Not on file  . Lack of Transportation (Non-Medical): Not on file  Physical Activity:   . Days of Exercise per Week: Not on file  . Minutes of Exercise per Session: Not on file  Stress:   . Feeling of Stress : Not on file  Social Connections:   . Frequency of Communication with Friends and Family: Not on file  . Frequency of Social Gatherings with Friends and Family: Not on file  . Attends Religious Services: Not on file  . Active Member of Clubs or Organizations: Not on file  . Attends Archivist Meetings: Not on file  . Marital Status: Not on file    Family History  Problem Relation Age of Onset  . Coronary artery disease Father   . Hypertension Father   . Hyperlipidemia Father   . Diabetes Father   . Congestive Heart Failure Father 21  . Arrhythmia Father        had an ICD  . Diabetes Mother   . Hypertension Mother   . Hyperlipidemia Mother   . Obesity Mother 18       died after bariatric surgery, liver failure and infection  . Coronary artery disease Maternal Grandfather        both grandfathers and several uncles  . Coronary artery disease Other   . Diabetes Sister        both sisters  . Stroke Maternal Aunt   . Stroke Maternal Uncle     Outpatient Encounter Medications as of 12/11/2019  Medication Sig  . insulin glargine (LANTUS) 100 UNIT/ML Solostar Pen  Inject 40 Units  into the skin at bedtime.  Marland Kitchen aspirin 81 MG EC tablet Take 1 tablet (81 mg total) by mouth daily. With food  . Blood Glucose Monitoring Suppl w/Device KIT Dispense based on patient and insurance preference. Check blood sugar daily before breakfast (ICD9 250.0)  . clopidogrel (PLAVIX) 75 MG tablet Take 1 tablet (75 mg total) by mouth daily.  . DULERA 200-5 MCG/ACT AERO Inhale 2 puffs into the lungs 2 (two) times daily.  . EUTHYROX 100 MCG tablet Take 1 tablet (100 mcg total) by mouth daily.  Marland Kitchen ezetimibe (ZETIA) 10 MG tablet Take 1 tablet (10 mg total) by mouth daily.  . famotidine (PEPCID) 20 MG tablet Take 20 mg by mouth 2 (two) times daily.  . fenofibrate (TRICOR) 145 MG tablet Take 1 tablet (145 mg total) by mouth daily.  Marland Kitchen glipiZIDE (GLUCOTROL XL) 5 MG 24 hr tablet Take 1 tablet (5 mg total) by mouth daily with breakfast.  . losartan (COZAAR) 25 MG tablet Take 0.5 tablets (12.5 mg total) by mouth daily.  . nitroGLYCERIN (NITROSTAT) 0.4 MG SL tablet Place 1 tablet (0.4 mg total) under the tongue every 5 (five) minutes as needed for chest pain.  . sitaGLIPtin (JANUVIA) 100 MG tablet Take 1 tablet (100 mg total) by mouth daily.  . Vitamin D, Ergocalciferol, (DRISDOL) 1.25 MG (50000 UNIT) CAPS capsule Take 1 capsule (50,000 Units total) by mouth every 7 (seven) days.  . [DISCONTINUED] glimepiride (AMARYL) 2 MG tablet Take 1 tablet (2 mg total) by mouth daily with breakfast.   No facility-administered encounter medications on file as of 12/11/2019.    ALLERGIES: Allergies  Allergen Reactions  . Crestor [Rosuvastatin] Other (See Comments)    rhabdomyolosis  . Trulicity [Dulaglutide] Nausea And Vomiting  . Bee Venom Swelling    SWELLING REACTION UNSPECIFIED     VACCINATION STATUS: Immunization History  Administered Date(s) Administered  . Tdap 12/12/2015    Diabetes He presents for his initial diabetic visit. He has type 2 diabetes mellitus. Onset time: He was diagnosed at approximate  age of 89 years. His disease course has been worsening. There are no hypoglycemic associated symptoms. Pertinent negatives for hypoglycemia include no confusion, headaches, pallor or seizures. Associated symptoms include foot paresthesias, polydipsia and polyuria. Pertinent negatives for diabetes include no chest pain, no fatigue, no polyphagia and no weakness. There are no hypoglycemic complications. Symptoms are worsening. Diabetic complications include a CVA, heart disease and PVD. Risk factors for coronary artery disease include diabetes mellitus, dyslipidemia, family history, obesity, male sex, tobacco exposure and sedentary lifestyle. Current diabetic treatments: He is currently on Lantus 15 units nightly, Januvia 100 mg daily, Amaryl 2 mg p.o. daily. His weight is increasing steadily. He is following a generally unhealthy diet. When asked about meal planning, he reported none. He has had a previous visit with a dietitian. He never participates in exercise. (He did not bring any logs nor meter to review.  His recent A1c was extremely high at 13.4%.  He does not report any hypoglycemia.) An ACE inhibitor/angiotensin II receptor blocker is being taken. He does not see a podiatrist.Eye exam is current.  Hyperlipidemia This is a chronic problem. The current episode started more than 1 year ago. The problem is uncontrolled. Exacerbating diseases include diabetes, hypothyroidism and obesity. Associated symptoms include myalgias. Pertinent negatives include no chest pain or shortness of breath. Current antihyperlipidemic treatment includes ezetimibe. Risk factors for coronary artery disease include dyslipidemia, diabetes mellitus, hypertension, male sex,  family history, obesity and a sedentary lifestyle.  Hypertension This is a chronic problem. The current episode started more than 1 year ago. The problem is controlled. Pertinent negatives include no chest pain, headaches, neck pain, palpitations or shortness of  breath. Risk factors for coronary artery disease include diabetes mellitus, dyslipidemia, family history, obesity, male gender, sedentary lifestyle and smoking/tobacco exposure. Past treatments include angiotensin blockers. Hypertensive end-organ damage includes CAD/MI, CVA, heart failure and PVD. Identifiable causes of hypertension include a thyroid problem.  Thyroid Problem Presents for initial visit. Onset time: He has taken levothyroxine for at least 2 years. Patient reports no constipation, diarrhea, fatigue or palpitations. The symptoms have been stable. Past treatments include levothyroxine. His past medical history is significant for diabetes, heart failure and hyperlipidemia. Risk factors include family history of goiter.     Review of Systems  Constitutional: Negative for chills, fatigue, fever and unexpected weight change.  HENT: Negative for dental problem, mouth sores and trouble swallowing.   Eyes: Negative for visual disturbance.  Respiratory: Negative for cough, choking, chest tightness, shortness of breath and wheezing.   Cardiovascular: Negative for chest pain, palpitations and leg swelling.  Gastrointestinal: Negative for abdominal distention, abdominal pain, constipation, diarrhea, nausea and vomiting.  Endocrine: Positive for polydipsia and polyuria. Negative for polyphagia.  Genitourinary: Negative for dysuria, flank pain, hematuria and urgency.  Musculoskeletal: Positive for myalgias. Negative for back pain, gait problem and neck pain.  Skin: Negative for pallor, rash and wound.  Neurological: Negative for seizures, syncope, weakness, numbness and headaches.  Psychiatric/Behavioral: Negative.  Negative for confusion and dysphoric mood.    Objective:    Vitals with BMI 12/11/2019 12/11/2019 11/20/2019  Height 6' 5"  6' 6"  6' 0"   Weight 280 lbs 280 lbs 279 lbs 5 oz  BMI 33.2 15.40 08.67  Systolic 619 - 509  Diastolic 70 - 94  Pulse 73 - 82    BP 114/70   Pulse 73    Ht 6' 5"  (1.956 m)   Wt 280 lb (127 kg)   BMI 33.20 kg/m   Wt Readings from Last 3 Encounters:  12/11/19 280 lb (127 kg)  12/11/19 280 lb (127 kg)  11/20/19 279 lb 4.8 oz (126.7 kg)     Physical Exam Constitutional:      General: He is not in acute distress.    Appearance: He is well-developed.  HENT:     Head: Normocephalic and atraumatic.  Neck:     Thyroid: No thyromegaly.     Trachea: No tracheal deviation.  Cardiovascular:     Rate and Rhythm: Normal rate.     Pulses:          Dorsalis pedis pulses are 1+ on the right side and 1+ on the left side.       Posterior tibial pulses are 1+ on the right side and 1+ on the left side.     Heart sounds: Normal heart sounds, S1 normal and S2 normal. No murmur heard.  No gallop.   Pulmonary:     Effort: Pulmonary effort is normal. No respiratory distress.     Breath sounds: Normal breath sounds. No wheezing.  Abdominal:     General: There is no distension.     Tenderness: There is no abdominal tenderness. There is no guarding.  Musculoskeletal:     Right shoulder: No swelling or deformity.     Cervical back: Normal range of motion and neck supple.  Skin:    General: Skin is  warm and dry.     Findings: No rash.     Nails: There is no clubbing.  Neurological:     Mental Status: He is alert and oriented to person, place, and time.     Cranial Nerves: No cranial nerve deficit.     Sensory: No sensory deficit.     Gait: Gait normal.     Deep Tendon Reflexes: Reflexes are normal and symmetric.  Psychiatric:        Speech: Speech normal.        Behavior: Behavior normal. Behavior is cooperative.        Thought Content: Thought content normal.        Judgment: Judgment normal.     CMP ( most recent) CMP     Component Value Date/Time   NA 131 (L) 10/15/2019 0046   K 3.7 10/15/2019 0046   CL 93 (L) 10/15/2019 0046   CO2 26 10/15/2019 0046   GLUCOSE 380 (H) 10/15/2019 0046   BUN 14 10/15/2019 0046   CREATININE 1.10  10/15/2019 0728   CALCIUM 10.1 10/15/2019 0046   PROT 9.0 (H) 07/29/2018 2238   ALBUMIN 4.6 07/29/2018 2238   AST 29 07/29/2018 2238   ALT 40 07/29/2018 2238   ALKPHOS 101 07/29/2018 2238   BILITOT 0.6 07/29/2018 2238   GFRNONAA >60 10/15/2019 0728   GFRAA >60 10/15/2019 0728     Diabetic Labs (most recent): Lab Results  Component Value Date   HGBA1C 13.8 (H) 10/15/2019   HGBA1C 7.2 (H) 02/11/2018   HGBA1C 7.9 (H) 01/06/2018     Lipid Panel ( most recent) Lipid Panel     Component Value Date/Time   CHOL 709 (H) 10/15/2019 0728   TRIG 2,420 (H) 10/15/2019 0728   HDL 36 (L) 10/15/2019 0728   CHOLHDL NOT REPORTED DUE TO HIGH TRIGLYCERIDES 10/15/2019 0728   VLDL UNABLE TO CALCULATE IF TRIGLYCERIDE OVER 400 mg/dL 10/15/2019 0728   LDLCALC UNABLE TO CALCULATE IF TRIGLYCERIDE OVER 400 mg/dL 10/15/2019 0728   LDLDIRECT UNABLE TO CALCULATE IF TRIGLYCERIDE IS >1293 mg/dL 10/15/2019 0728      Lab Results  Component Value Date   TSH 83.261 (H) 10/15/2019   TSH 60.657 (H) 01/05/2018   TSH 84.910 (H) 11/13/2017   TSH >100.000 (H) 10/06/2013   FREET4 <0.25 (L) 10/15/2019   FREET4 0.24 (L) 10/06/2013       Assessment & Plan:   1. DM type 2 causing vascular disease ([place])  - Devin [name] has currently uncontrolled symptomatic type 2 DM since  49 years of age,  with most recent A1c of 13.4 %. Recent labs reviewed. - I had a long discussion with him about the progressive nature of diabetes and the pathology behind its complications. -his diabetes is complicated by coronary artery disease, CVA, heart failure, peripheral artery disease, obesity/sedentary life, smoking and he remains at a high risk for more acute and chronic complications which include CAD, CVA, CKD, retinopathy, and neuropathy. These are all discussed in detail with him.  - I have counseled him on diet  and weight management  by adopting a carbohydrate restricted/protein rich diet. Patient is encouraged to switch  to  unprocessed or minimally processed     complex starch and increased protein intake (animal or plant source), fruits, and vegetables. -  he is advised to stick to a routine mealtimes to eat 3 meals  a day and avoid unnecessary snacks ( to snack only to correct hypoglycemia).   -  he admits that there is a room for improvement in his food and drink choices. - Suggestion is made for him to avoid simple carbohydrates  from his diet including Cakes, Sweet Desserts, Ice Cream, Soda (diet and regular), Sweet Tea, Candies, Chips, Cookies, Store Bought Juices, Alcohol in Excess of  1-2 drinks a day, Artificial Sweeteners,  Coffee Creamer, and "Sugar-free" Products. This will help patient to have more stable blood glucose profile and potentially avoid unintended weight gain.  - he will be scheduled with Jearld Fenton, RDN, CDE for diabetes education.  - I have approached him with the following individualized plan to manage  his diabetes and patient agrees:   -In night of his chronic glycemic burden, he may need multiple daily injections of insulin in order for him to achieve and maintain control of diabetes to target. -In preparation, he is advised to increase his Lantus to 40 units nightly, start monitoring blood glucose 4 times a day-daily before meals and at bedtime and return in 1 week with his meter and logs for evaluation. If he is found to have significant postprandial hyperglycemia, he will be considered for prandial insulin. This patient will benefit from a CGM.  I discussed and prescribed the freestyle libre device for him.  - he is warned not to take insulin without proper monitoring per orders.  - he is encouraged to call clinic for blood glucose levels less than 70 or above 300 mg /dl. - he is advised to continue Januvia 100 mg p.o. daily, therapeutically suitable for patient . - his glimepiride  will be discontinued, glipizide 5 mg XL p.o. daily at breakfast will be restarted.   - he  will be considered for incretin therapy as appropriate next visit.  - Specific targets for  A1c;  LDL, HDL,  and Triglycerides were discussed with the patient.  2) Blood Pressure /Hypertension:  his blood pressure is  controlled to target.   he is advised to continue his current medications including losartan 25 mg p.o. daily with breakfast . 3) Lipids/Hyperlipidemia:   Review of his recent lipid panel showed un controlled triglycerides 2400+, LDL not calculated.  He does not tolerate statins.  Patient is on Zetia 10 mg p.o. daily.he had extremely high risk for pancreatitis.  He will be considered for Lovaza next visit.     4)  Weight/Diet:  Body mass index is 33.2 kg/m.  -   clearly complicating his diabetes care.   he is  a candidate for weight loss. I discussed with him the fact that loss of 5 - 10% of his  current body weight will have the most impact on his diabetes management.  Exercise, and detailed carbohydrates information provided  -  detailed on discharge instructions.  5) hypothyroidism She has questionable compliance.  The circumstance of his diagnosis are not available to review.  He is on levothyroxine 100 mcg p.o. daily before breakfast.     - We discussed about the correct intake of his thyroid hormone, on empty stomach at fasting, with water, separated by at least 30 minutes from breakfast and other medications,  and separated by more than 4 hours from calcium, iron, multivitamins, acid reflux medications (PPIs). -Patient is made aware of the fact that thyroid hormone replacement is needed for life, dose to be adjusted by periodic monitoring of thyroid function tests.   6) vitamin D deficiency He is advised to continue ergocalciferol 50,000 units weekly for 12 weeks.  7) Chronic Care/Health Maintenance:  -  he  is on ARB and did not tolerate statin medications and  is encouraged to initiate and continue to follow up with Ophthalmology, Dentist,  Podiatrist at least yearly or  according to recommendations, and advised to  quit smoking. I have recommended yearly flu vaccine and pneumonia vaccine at least every 5 years; moderate intensity exercise for up to 150 minutes weekly; and  sleep for at least 7 hours a day.   POCT ABI Results 12/11/19  His ABI is abnormal. Right ABI: 1.43      left ABI: 1.32.  Right leg systolic / diastolic: 712/197 mmHg Left leg systolic / diastolic: 588/32 mmHg  Arm systolic / diastolic: 549/82 mmHG He will be referred to  vascular surgery.  - he is  advised to maintain close follow up with [place] Nation, MD for primary care needs, as well as his other providers for optimal and coordinated care.   - Time spent in this patient care: 80 min, of which > 50% was spent in  counseling  him about his chronically uncontrolled type 2 diabetes with multiple complications, hyperlipidemia, hypertension, hypothyroidism, obesity/sedentary life, chronic smoking, vitamin deficiency and the rest reviewing his blood glucose logs , discussing his hypoglycemia and hyperglycemia episodes, reviewing his current and  previous labs / studies  ( including abstraction from other facilities) and medications  doses and developing a  long term treatment plan based on the latest standards of care/ guidelines; and documenting his care.    Please refer to Patient Instructions for Blood Glucose Monitoring and Insulin/Medications Dosing Guide"  in media tab for additional information. Please  also refer to " Patient Self Inventory" in the Media  tab for reviewed elements of pertinent patient history.  Devin [name] participated in the discussions, expressed understanding, and voiced agreement with the above plans.  All questions were answered to his satisfaction. he is encouraged to contact clinic should he have any questions or concerns prior to his return visit.   Follow up plan: - Return in about 1 week (around 12/18/2019) for F/U with Meter and Logs Only - no  Labs.  Glade Lloyd, MD [facility] [address] [place], [place] [address] Phone: [phone]  Fax: [phone]    12/11/2019, 9:43 PM  This note was partially dictated with voice recognition software. Similar sounding words can be transcribed inadequately or may not  be corrected upon review.

## 2019-12-11 NOTE — Patient Instructions (Signed)
Goals  Follow MY Plate Eat 16-55 g CHO at each meal Eat meals on time Drink only water  Test blood sugars 4 times per day Bring meter and logs with you next visit. Walk 30 minutes or more a day Get Am BS below 150 and bedtime below 200 is short term goal Increase lower carb vegetables Cut out fast food and processed foods.

## 2019-12-15 ENCOUNTER — Encounter: Payer: Self-pay | Admitting: Nutrition

## 2019-12-18 ENCOUNTER — Ambulatory Visit (HOSPITAL_COMMUNITY): Payer: Medicaid Other

## 2019-12-18 ENCOUNTER — Ambulatory Visit: Payer: Medicaid Other | Admitting: "Endocrinology

## 2019-12-19 ENCOUNTER — Other Ambulatory Visit: Payer: Self-pay

## 2019-12-19 ENCOUNTER — Ambulatory Visit (INDEPENDENT_AMBULATORY_CARE_PROVIDER_SITE_OTHER): Payer: Medicaid Other | Admitting: "Endocrinology

## 2019-12-19 ENCOUNTER — Encounter: Payer: Self-pay | Admitting: "Endocrinology

## 2019-12-19 VITALS — BP 113/73 | HR 71 | Ht 77.0 in | Wt 282.0 lb

## 2019-12-19 DIAGNOSIS — E038 Other specified hypothyroidism: Secondary | ICD-10-CM

## 2019-12-19 DIAGNOSIS — E559 Vitamin D deficiency, unspecified: Secondary | ICD-10-CM

## 2019-12-19 DIAGNOSIS — E782 Mixed hyperlipidemia: Secondary | ICD-10-CM

## 2019-12-19 DIAGNOSIS — Z789 Other specified health status: Secondary | ICD-10-CM

## 2019-12-19 DIAGNOSIS — E1159 Type 2 diabetes mellitus with other circulatory complications: Secondary | ICD-10-CM

## 2019-12-19 DIAGNOSIS — I10 Essential (primary) hypertension: Secondary | ICD-10-CM

## 2019-12-19 MED ORDER — BD PEN NEEDLE SHORT U/F 31G X 8 MM MISC: 1.0000 | 3 refills | Status: DC

## 2019-12-19 MED ORDER — LANTUS SOLOSTAR 100 UNIT/ML ~~LOC~~ SOPN: 50.0000 [IU] | PEN_INJECTOR | Freq: Every day | SUBCUTANEOUS | 2 refills | Status: DC

## 2019-12-19 NOTE — Patient Instructions (Signed)

## 2019-12-19 NOTE — Progress Notes (Signed)
12/19/2019, 10:01 AM  Endocrinology follow-up note   Subjective:    Patient ID: Devin [name], male    DOB: 16-May-1970.  Devin [name] is being seen in follow-up after he was seen in consultation for management of currently uncontrolled symptomatic diabetes requested by  [place] Nation, MD.   Past Medical History:  Diagnosis Date  . Asthma   . Chest pain   . CHF (congestive heart failure) ([place])   . Coronary artery disease    a. s/p DES x2 to LAD and DES to OM in 11/2017  . Depression   . Dyspnea   . GERD (gastroesophageal reflux disease)   . Headache   . History of kidney stones   . History of rhabdomyolysis   . Hyperlipidemia   . Hypertension   . Hypothyroidism   . Ischemic cardiomyopathy    a. 11/2017: echo showing EF of 35-40%, diffuse HK, and Grade 2 DD  . Kidney calculus 2014  . Pericardial effusion    Small, by dobutamine echocardiogram, 04/2006  . PONV (postoperative nausea and vomiting)   . Sleep apnea    does not wear cpap  . Stroke ([place])   . Tobacco abuse   . Type 2 diabetes mellitus without complications ([place]) 6/77/3736  . Vitamin D deficiency     Past Surgical History:  Procedure Laterality Date  . APPENDECTOMY    . 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 CHOLECYSTECTOMY N/A 01/27/2013   Procedure: LAPAROSCOPIC CHOLECYSTECTOMY;  Surgeon: Jamesetta So, MD;  Location: [facility];  Service: General;  Laterality: N/A;  . CORONARY STENT INTERVENTION N/A 11/15/2017   Procedure: CORONARY STENT INTERVENTION;  Surgeon: Leonie Man, MD;  Location: [facility];  Service: Cardiovascular;  Laterality: N/A;  . HERNIA REPAIR     As a child  . INSERTION OF MESH N/A 11/13/2012   Procedure: INSERTION OF MESH;  Surgeon: Jamesetta So, MD;  Location: [facility];  Service: General;  Laterality: N/A;  .  IR ANGIO INTRA EXTRACRAN SEL COM CAROTID INNOMINATE BILAT MOD SED  12/01/2017  . IR ANGIO INTRA EXTRACRAN SEL COM CAROTID INNOMINATE BILAT MOD SED  02/13/2018  . IR ANGIO VERTEBRAL SEL SUBCLAVIAN INNOMINATE UNI L MOD SED  02/13/2018  . IR ANGIO VERTEBRAL SEL VERTEBRAL BILAT MOD SED  12/01/2017  . IR ANGIO VERTEBRAL SEL VERTEBRAL UNI R MOD SED  02/13/2018  . IR INTRA CRAN STENT  12/03/2017  . IR PTA INTRACRANIAL  02/25/2018  . IR US GUIDE VASC ACCESS RIGHT  02/13/2018  . KIDNEY STONE SURGERY    . LEFT HEART CATH AND CORONARY ANGIOGRAPHY N/A 11/15/2017   Procedure: LEFT HEART CATH AND CORONARY ANGIOGRAPHY;  Surgeon: Leonie Man, MD;  Location: [facility];  Service: Cardiovascular;  Laterality: N/A;  . PERCUTANEOUS NEPHROLITHOTRIPSY    . RADIOLOGY WITH ANESTHESIA Left 12/03/2017   Procedure: Angioplasty with possible stenting of left VBJ;  Surgeon: Luanne Bras, MD;  Location: [facility];  Service: Radiology;  Laterality: Left;  . RADIOLOGY WITH ANESTHESIA N/A 02/25/2018  Procedure: STENT PLACEMENT;  Surgeon: Luanne Bras, MD;  Location: [facility];  Service: Radiology;  Laterality: N/A;  . UMBILICAL HERNIA REPAIR N/A 11/13/2012   Procedure: UMBILICAL HERNIORRHAPHY;  Surgeon: Jamesetta So, MD;  Location: [facility];  Service: General;  Laterality: N/A;  . VARICOCELECTOMY      Social History   Socioeconomic History  . Marital status: Divorced    Spouse name: Not on file  . Number of children: Not on file  . Years of education: Not on file  . Highest education level: Not on file  Occupational History  . Occupation: Disability     Comment:      Employer: [employer]  Tobacco Use  . Smoking status: Current Every Day Smoker    Packs/day: 1.00    Years: 26.00    Pack years: 26.00    Types: Cigarettes    Start date: 03/13/1985  . Smokeless tobacco: Former Systems developer    Types: Snuff, Sarina Ser    Quit date: 03/14/1987  Vaping Use  . Vaping Use: Former  . Devices: quit in 2011  Substance and  Sexual Activity  . Alcohol use: No  . Drug use: No  . Sexual activity: Not on file  Other Topics Concern  . Not on file  Social History Narrative   Eats fast food daily   No exercise outside of work   Lives in [place] with his dog   MAINTENANCE TECHNICIAN   Caffeine use: tea/soda daily   Left handed    Social Determinants of Health   Financial Resource Strain:   . Difficulty of Paying Living Expenses: Not on file  Food Insecurity:   . Worried About Charity fundraiser in the Last Year: Not on file  . Ran Out of Food in the Last Year: Not on file  Transportation Needs:   . Lack of Transportation (Medical): Not on file  . Lack of Transportation (Non-Medical): Not on file  Physical Activity:   . Days of Exercise per Week: Not on file  . Minutes of Exercise per Session: Not on file  Stress:   . Feeling of Stress : Not on file  Social Connections:   . Frequency of Communication with Friends and Family: Not on file  . Frequency of Social Gatherings with Friends and Family: Not on file  . Attends Religious Services: Not on file  . Active Member of Clubs or Organizations: Not on file  . Attends Archivist Meetings: Not on file  . Marital Status: Not on file    Family History  Problem Relation Age of Onset  . Coronary artery disease Father   . Hypertension Father   . Hyperlipidemia Father   . Diabetes Father   . Congestive Heart Failure Father 18  . Arrhythmia Father        had an ICD  . Diabetes Mother   . Hypertension Mother   . Hyperlipidemia Mother   . Obesity Mother 53       died after bariatric surgery, liver failure and infection  . Coronary artery disease Maternal Grandfather        both grandfathers and several uncles  . Coronary artery disease Other   . Diabetes Sister        both sisters  . Stroke Maternal Aunt   . Stroke Maternal Uncle     Outpatient Encounter Medications as of 12/19/2019  Medication Sig  . aspirin 81 MG EC tablet Take 1 tablet  (81 mg total)  by mouth daily. With food  . clopidogrel (PLAVIX) 75 MG tablet Take 1 tablet (75 mg total) by mouth daily.  . DULERA 200-5 MCG/ACT AERO Inhale 2 puffs into the lungs 2 (two) times daily.  Marland Kitchen escitalopram (LEXAPRO) 10 MG tablet Take 10 mg by mouth daily.  . EUTHYROX 100 MCG tablet Take 1 tablet (100 mcg total) by mouth daily.  Marland Kitchen ezetimibe (ZETIA) 10 MG tablet Take 1 tablet (10 mg total) by mouth daily.  . famotidine (PEPCID) 20 MG tablet Take 20 mg by mouth 2 (two) times daily.  . fenofibrate (TRICOR) 145 MG tablet Take 1 tablet (145 mg total) by mouth daily.  Marland Kitchen glipiZIDE (GLUCOTROL XL) 5 MG 24 hr tablet Take 1 tablet (5 mg total) by mouth daily with breakfast.  . sitaGLIPtin (JANUVIA) 100 MG tablet Take 1 tablet (100 mg total) by mouth daily.  . [DISCONTINUED] insulin glargine (LANTUS) 100 UNIT/ML Solostar Pen Inject 40 Units into the skin at bedtime.  . Blood Glucose Monitoring Suppl w/Device KIT Dispense based on patient and insurance preference. Check blood sugar daily before breakfast (ICD9 250.0)  . insulin glargine (LANTUS SOLOSTAR) 100 UNIT/ML Solostar Pen Inject 50 Units into the skin daily.  . Insulin Pen Needle (B-D ULTRAFINE III SHORT PEN) 31G X 8 MM MISC 1 each by Does not apply route as directed.  Marland Kitchen losartan (COZAAR) 25 MG tablet Take 0.5 tablets (12.5 mg total) by mouth daily.  . nitroGLYCERIN (NITROSTAT) 0.4 MG SL tablet Place 1 tablet (0.4 mg total) under the tongue every 5 (five) minutes as needed for chest pain.   No facility-administered encounter medications on file as of 12/19/2019.    ALLERGIES: Allergies  Allergen Reactions  . Crestor [Rosuvastatin] Other (See Comments)    rhabdomyolosis  . Trulicity [Dulaglutide] Nausea And Vomiting  . Bee Venom Swelling    SWELLING REACTION UNSPECIFIED     VACCINATION STATUS: Immunization History  Administered Date(s) Administered  . Tdap 12/12/2015    Diabetes He presents for his follow-up diabetic visit. He  has type 2 diabetes mellitus. Onset time: He was diagnosed at approximate age of 48 years. His disease course has been improving. There are no hypoglycemic associated symptoms. Pertinent negatives for hypoglycemia include no confusion, headaches, pallor or seizures. Associated symptoms include foot paresthesias, polydipsia and polyuria. Pertinent negatives for diabetes include no chest pain, no fatigue, no polyphagia and no weakness. There are no hypoglycemic complications. Symptoms are worsening. Diabetic complications include a CVA, heart disease and PVD. Risk factors for coronary artery disease include diabetes mellitus, dyslipidemia, family history, obesity, male sex, tobacco exposure and sedentary lifestyle. Current diabetic treatments: He is currently on Lantus 15 units nightly, Januvia 100 mg daily, Amaryl 2 mg p.o. daily. His weight is fluctuating minimally. He is following a generally unhealthy diet. When asked about meal planning, he reported none. He has had a previous visit with a dietitian. He never participates in exercise. His home blood glucose trend is decreasing steadily. (He returns with significant improvement in his glycemic profile to near target levels.  No hypoglycemia.  His most recent A1c was 13.4%.    ) An ACE inhibitor/angiotensin II receptor blocker is being taken. He does not see a podiatrist.Eye exam is current.  Hyperlipidemia This is a chronic problem. The current episode started more than 1 year ago. The problem is uncontrolled. Exacerbating diseases include diabetes, hypothyroidism and obesity. Associated symptoms include myalgias. Pertinent negatives include no chest pain or shortness of breath. Current antihyperlipidemic  treatment includes ezetimibe. Risk factors for coronary artery disease include dyslipidemia, diabetes mellitus, hypertension, male sex, family history, obesity and a sedentary lifestyle.  Hypertension This is a chronic problem. The current episode started  more than 1 year ago. The problem is controlled. Pertinent negatives include no chest pain, headaches, neck pain, palpitations or shortness of breath. Risk factors for coronary artery disease include diabetes mellitus, dyslipidemia, family history, obesity, male gender, sedentary lifestyle and smoking/tobacco exposure. Past treatments include angiotensin blockers. Hypertensive end-organ damage includes CAD/MI, CVA, heart failure and PVD. Identifiable causes of hypertension include a thyroid problem.  Thyroid Problem Presents for initial visit. Onset time: He has taken levothyroxine for at least 2 years. Patient reports no constipation, diarrhea, fatigue or palpitations. The symptoms have been stable. Past treatments include levothyroxine. His past medical history is significant for diabetes, heart failure and hyperlipidemia. Risk factors include family history of goiter.     Review of Systems  Constitutional: Negative for chills, fatigue, fever and unexpected weight change.  HENT: Negative for dental problem, mouth sores and trouble swallowing.   Eyes: Negative for visual disturbance.  Respiratory: Negative for cough, choking, chest tightness, shortness of breath and wheezing.   Cardiovascular: Negative for chest pain, palpitations and leg swelling.  Gastrointestinal: Negative for abdominal distention, abdominal pain, constipation, diarrhea, nausea and vomiting.  Endocrine: Positive for polydipsia and polyuria. Negative for polyphagia.  Genitourinary: Negative for dysuria, flank pain, hematuria and urgency.  Musculoskeletal: Positive for myalgias. Negative for back pain, gait problem and neck pain.  Skin: Negative for pallor, rash and wound.  Neurological: Negative for seizures, syncope, weakness, numbness and headaches.  Psychiatric/Behavioral: Negative.  Negative for confusion and dysphoric mood.    Objective:    Vitals with BMI 12/19/2019 12/11/2019 12/11/2019  Height 6' 5"  6' 5"  6' 6"    Weight 282 lbs 280 lbs 280 lbs  BMI 33.43 92.1 19.41  Systolic 740 814 -  Diastolic 73 70 -  Pulse 71 73 -    BP 113/73   Pulse 71   Ht 6' 5"  (1.956 m)   Wt 282 lb (127.9 kg)   BMI 33.44 kg/m   Wt Readings from Last 3 Encounters:  12/19/19 282 lb (127.9 kg)  12/11/19 280 lb (127 kg)  12/11/19 280 lb (127 kg)     Physical Exam Constitutional:      General: He is not in acute distress.    Appearance: He is well-developed.  HENT:     Head: Normocephalic and atraumatic.  Neck:     Thyroid: No thyromegaly.     Trachea: No tracheal deviation.  Cardiovascular:     Rate and Rhythm: Normal rate.     Pulses:          Dorsalis pedis pulses are 1+ on the right side and 1+ on the left side.       Posterior tibial pulses are 1+ on the right side and 1+ on the left side.     Heart sounds: Normal heart sounds, S1 normal and S2 normal. No murmur heard.  No gallop.   Pulmonary:     Effort: Pulmonary effort is normal. No respiratory distress.     Breath sounds: Normal breath sounds. No wheezing.  Abdominal:     General: There is no distension.     Tenderness: There is no abdominal tenderness. There is no guarding.  Musculoskeletal:     Right shoulder: No swelling or deformity.     Cervical back: Normal range of  motion and neck supple.  Skin:    General: Skin is warm and dry.     Findings: No rash.     Nails: There is no clubbing.  Neurological:     Mental Status: He is alert and oriented to person, place, and time.     Cranial Nerves: No cranial nerve deficit.     Sensory: No sensory deficit.     Gait: Gait normal.     Deep Tendon Reflexes: Reflexes are normal and symmetric.  Psychiatric:        Speech: Speech normal.        Behavior: Behavior normal. Behavior is cooperative.        Thought Content: Thought content normal.        Judgment: Judgment normal.     CMP ( most recent) CMP     Component Value Date/Time   NA 131 (L) 10/15/2019 0046   K 3.7 10/15/2019 0046    CL 93 (L) 10/15/2019 0046   CO2 26 10/15/2019 0046   GLUCOSE 380 (H) 10/15/2019 0046   BUN 14 10/15/2019 0046   CREATININE 1.10 10/15/2019 0728   CALCIUM 10.1 10/15/2019 0046   PROT 9.0 (H) 07/29/2018 2238   ALBUMIN 4.6 07/29/2018 2238   AST 29 07/29/2018 2238   ALT 40 07/29/2018 2238   ALKPHOS 101 07/29/2018 2238   BILITOT 0.6 07/29/2018 2238   GFRNONAA >60 10/15/2019 0728   GFRAA >60 10/15/2019 0728     Diabetic Labs (most recent): Lab Results  Component Value Date   HGBA1C 13.8 (H) 10/15/2019   HGBA1C 7.2 (H) 02/11/2018   HGBA1C 7.9 (H) 01/06/2018     Lipid Panel ( most recent) Lipid Panel     Component Value Date/Time   CHOL 709 (H) 10/15/2019 0728   TRIG 2,420 (H) 10/15/2019 0728   HDL 36 (L) 10/15/2019 0728   CHOLHDL NOT REPORTED DUE TO HIGH TRIGLYCERIDES 10/15/2019 0728   VLDL UNABLE TO CALCULATE IF TRIGLYCERIDE OVER 400 mg/dL 10/15/2019 0728   LDLCALC UNABLE TO CALCULATE IF TRIGLYCERIDE OVER 400 mg/dL 10/15/2019 0728   LDLDIRECT UNABLE TO CALCULATE IF TRIGLYCERIDE IS >1293 mg/dL 10/15/2019 0728      Lab Results  Component Value Date   TSH 83.261 (H) 10/15/2019   TSH 60.657 (H) 01/05/2018   TSH 84.910 (H) 11/13/2017   TSH >100.000 (H) 10/06/2013   FREET4 <0.25 (L) 10/15/2019   FREET4 0.24 (L) 10/06/2013       Assessment & Plan:   1. DM type 2 causing vascular disease ([place])  - Devin [name] has currently uncontrolled symptomatic type 2 DM since  49 years of age. He returns with significant improvement in his glycemic profile to near target levels.  No hypoglycemia.  His most recent A1c was 13.4%.  - Recent labs reviewed. - I had a long discussion with him about the progressive nature of diabetes and the pathology behind its complications. -his diabetes is complicated by coronary artery disease, CVA, heart failure, peripheral artery disease, obesity/sedentary life, smoking and he remains at a high risk for more acute and chronic complications which  include CAD, CVA, CKD, retinopathy, and neuropathy. These are all discussed in detail with him.  - I have counseled him on diet  and weight management  by adopting a carbohydrate restricted/protein rich diet. Patient is encouraged to switch to  unprocessed or minimally processed     complex starch and increased protein intake (animal or plant source), fruits, and vegetables. -  he is advised to stick to a routine mealtimes to eat 3 meals  a day and avoid unnecessary snacks ( to snack only to correct hypoglycemia).   - he  admits there is a room for improvement in his diet and drink choices. -  Suggestion is made for him to avoid simple carbohydrates  from his diet including Cakes, Sweet Desserts / Pastries, Ice Cream, Soda (diet and regular), Sweet Tea, Candies, Chips, Cookies, Sweet Pastries,  Store Bought Juices, Alcohol in Excess of  1-2 drinks a day, Artificial Sweeteners, Coffee Creamer, and "Sugar-free" Products. This will help patient to have stable blood glucose profile and potentially avoid unintended weight gain.   - he will be scheduled with Jearld Fenton, RDN, CDE for diabetes education.  - I have approached him with the following individualized plan to manage  his diabetes and patient agrees:   -In light of his chronic glycemic burden, he will continue to need insulin treatment in order for him to achieve control of diabetes to target.  However, based on his presentation with near target glycemic profile he will not need prandial insulin for now.    -He is advised to increase his Lantus to 50 units nightly, continue to monitor blood glucose twice a day-daily before breakfast and at bedtime. -Lantus in the pain and pen needles were prescribed for him to replace his Lantus vials. - he is warned not to take insulin without proper monitoring per orders.  - he is encouraged to call clinic for blood glucose levels less than 70 or above 300 mg /dl. - he is advised to continue Januvia 100  mg p.o. daily, therapeutically suitable for patient . -He is advised to continue glipizide 5 mg XL p.o. daily at breakfast will be restarted.   - Specific targets for  A1c;  LDL, HDL,  and Triglycerides were discussed with the patient.  2) Blood Pressure /Hypertension:  his blood pressure is  controlled to target.   he is advised to continue his current medications including losartan 25 mg p.o. daily with breakfast . 3) Lipids/Hyperlipidemia:   Review of his recent lipid panel showed un controlled triglycerides 2400+, LDL not calculated.  He does not tolerate statins.  Patient is on Zetia 10 mg p.o. daily, as well as fenofibrate 145 mg p.o. daily at bedtime.  He had extremely high risk for pancreatitis.  His insurance will not pay for Lovaza nor Vascepa.  I discussed with the patient the fact that diabetes management with insulin will also help with hypertriglyceridemia.   4)  Weight/Diet:  Body mass index is 33.44 kg/m.  -   clearly complicating his diabetes care.   he is  a candidate for weight loss. I discussed with him the fact that loss of 5 - 10% of his  current body weight will have the most impact on his diabetes management.  Exercise, and detailed carbohydrates information provided  -  detailed on discharge instructions.  5) hypothyroidism She has questionable compliance.  The circumstance of his diagnosis are not available to review.  He is on levothyroxine 100 mcg p.o. daily before breakfast.     - We discussed about the correct intake of his thyroid hormone, on empty stomach at fasting, with water, separated by at least 30 minutes from breakfast and other medications,  and separated by more than 4 hours from calcium, iron, multivitamins, acid reflux medications (PPIs). -Patient is made aware of the fact that thyroid hormone replacement is needed for life,  dose to be adjusted by periodic monitoring of thyroid function tests.    6) vitamin D deficiency He is advised to continue  ergocalciferol 50,000 units weekly for 12 weeks.  7) Chronic Care/Health Maintenance:  -he  is on ARB and did not tolerate statin medications and  is encouraged to initiate and continue to follow up with Ophthalmology, Dentist,  Podiatrist at least yearly or according to recommendations, and advised to  quit smoking. I have recommended yearly flu vaccine and pneumonia vaccine at least every 5 years; moderate intensity exercise for up to 150 minutes weekly; and  sleep for at least 7 hours a day.   POCT ABI Results on December 11, 2019.  His ABI is abnormal. Right ABI: 1.43      left ABI: 1.32.  Right leg systolic / diastolic: 614/830 mmHg Left leg systolic / diastolic: 735/43 mmHg  Arm systolic / diastolic: 014/84 mmHG He is referral to vascular surgery is pending.    - he is  advised to maintain close follow up with Aubrey Nation, MD for primary care needs, as well as his other providers for optimal and coordinated care.   - Time spent on this patient care encounter:  35 min, of which > 50% was spent in  counseling and the rest reviewing his blood glucose logs , discussing his hypoglycemia and hyperglycemia episodes, reviewing his current and  previous labs / studies  ( including abstraction from other facilities) and medications  doses and developing a  long term treatment plan and documenting his care.   Please refer to Patient Instructions for Blood Glucose Monitoring and Insulin/Medications Dosing Guide"  in media tab for additional information. Please  also refer to " Patient Self Inventory" in the Media  tab for reviewed elements of pertinent patient history.  Devin [name] participated in the discussions, expressed understanding, and voiced agreement with the above plans.  All questions were answered to his satisfaction. he is encouraged to contact clinic should he have any questions or concerns prior to his return visit.    Follow up plan: - Return in about 5 weeks (around  01/23/2020) for Bring Meter and Logs- A1c in Office, Urine MA - [place].  Glade Lloyd, MD [facility] [address] [place], [place] [address] Phone: [phone]  Fax: [phone]    12/19/2019, 10:01 AM  This note was partially dictated with voice recognition software. Similar sounding words can be transcribed inadequately or may not  be corrected upon review.

## 2019-12-22 NOTE — Progress Notes (Signed)
Patient ID: [name]                 DOB: 1971/01/03                    MRN: [id number]     HPI: Devin [name] is a 49 y.o. male patient referred to lipid clinic by Devin July, NP; cardiologist is Dr. Harl [name]. PMH is significant for HTN, T2DM, HLD, CAD (s/p DES x2 to LAD and DES to OM in 11/2017), ischemic cardiomyopathy, CHF (EF 50-55% 10/2019), TIA (11/2017), CVA (12/2017), CKD.  Recently visited ED on 10/15/19 for chest pain and reported not taking medications for at least 1 month. Troponins were negative and EF improved to 50-55%. Patient was advised to take his medications as prescribed. Last seen by Devin [name] (cardiology) on 11/20/19 in which he reported ongoing left neck, arm, axillary pain with numbness and tingling down left arm to fingers - currently being evaluated by a neurologist. Denied any classic anginal symptoms. Recent labs on 10/15/2019 showed total cholesterol 709, triglycerides 2420, HDL 36.  LDL and direct LDL was unable to be calculated. LFTs wnl as of May 2020. Referred to lipid lipid for placement on PCSK9 inhibitors due to statin intolerance.  Of note, patient has history of uncontrolled DM with A1C of 13.8%.   Patient presents today in good spirits with his sister. Reports medication adherence with Zetia 10 mg daily and fenofibrate 145 mg daily in the mornings, however occasionally misses 1 dose every week. Reports significant history of statin-induced rhabdomyolysis requiring hospitalization 3 times dating back to 2014. Patient reports being hospitalized for 1 week each time. In 2015, his total CK was greater than 9000 on hospital admission. Rosuvastatin has since been discontinued and has not tried any other statins due to this risk factor. Reports abstinence from alcohol, however states he does not have the best diet. Eats primarily fast foods, and drinks lemonade.   Current Medications: Zetia 10 mg daily, fenofibrate 145 mg daily Intolerances: rosuvastatin (rhabdomyolysis),  gemfibrozil (myalgias) Risk Factors: progressive heart disease, tobacco smoker, CKD, T2DM LDL goal: < 55 mg/dL  Diet:  Breakfast: sausage, bacon, or ham biscuit from fast food Lunch: fast food Dinner: fast food  Family History: Arrhythmia in his father; Congestive Heart Failure (age of onset: 30) in his father; Coronary artery disease in his father, maternal grandfather, and another family member; Diabetes in his father, mother, and sister; Hyperlipidemia in his father and mother; Hypertension in his father and mother; Obesity (age of onset: 62) in his mother; Stroke in his maternal aunt and maternal uncle  Social History: tobacco smoker (started smoking ~34 years ago with a 26 pack-year history); former chewing/snuff tobacco user (quit 1989). Denies alcohol and illicit drug use  Labs: 10/15/19: LDL-unable to calculate; TG 2,420 TC 709, HDL 36 (Zetia, fenofibrate) 02/11/18: LDL 108, TG 391, TC 211, HDL 25 (Zetia, fenofibrate)  Past Medical History:  Diagnosis Date   Asthma    Chest pain    CHF (congestive heart failure) ([place])    Coronary artery disease    a. s/p DES x2 to LAD and DES to OM in 11/2017   Depression    Dyspnea    GERD (gastroesophageal reflux disease)    Headache    History of kidney stones    History of rhabdomyolysis    Hyperlipidemia    Hypertension    Hypothyroidism    Ischemic cardiomyopathy    a. 11/2017: echo showing EF  of 35-40%, diffuse HK, and Grade 2 DD   Kidney calculus 2014   Pericardial effusion    Small, by dobutamine echocardiogram, 04/2006   PONV (postoperative nausea and vomiting)    Sleep apnea    does not wear cpap   Stroke [facility])    Tobacco abuse    Type 2 diabetes mellitus without complications ([place]) 2/35/3614   Vitamin D deficiency     Current Outpatient Medications on File Prior to Visit  Medication Sig Dispense Refill   aspirin 81 MG EC tablet Take 1 tablet (81 mg total) by mouth daily. With food 30 tablet 0    Blood Glucose Monitoring Suppl w/Device KIT Dispense based on patient and insurance preference. Check blood sugar daily before breakfast (ICD9 250.0) 1 kit 1   clopidogrel (PLAVIX) 75 MG tablet Take 1 tablet (75 mg total) by mouth daily. 30 tablet 1   DULERA 200-5 MCG/ACT AERO Inhale 2 puffs into the lungs 2 (two) times daily. 8.8 g 1   escitalopram (LEXAPRO) 10 MG tablet Take 10 mg by mouth daily.     EUTHYROX 100 MCG tablet Take 1 tablet (100 mcg total) by mouth daily. 30 tablet 1   ezetimibe (ZETIA) 10 MG tablet Take 1 tablet (10 mg total) by mouth daily. 30 tablet 1   famotidine (PEPCID) 20 MG tablet Take 20 mg by mouth 2 (two) times daily.     fenofibrate (TRICOR) 145 MG tablet Take 1 tablet (145 mg total) by mouth daily. 30 tablet 1   glipiZIDE (GLUCOTROL XL) 5 MG 24 hr tablet Take 1 tablet (5 mg total) by mouth daily with breakfast. 30 tablet 3   insulin glargine (LANTUS SOLOSTAR) 100 UNIT/ML Solostar Pen Inject 50 Units into the skin daily. 15 mL 2   Insulin Pen Needle (B-D ULTRAFINE III SHORT PEN) 31G X 8 MM MISC 1 each by Does not apply route as directed. 100 each 3   losartan (COZAAR) 25 MG tablet Take 0.5 tablets (12.5 mg total) by mouth daily. 15 tablet 1   nitroGLYCERIN (NITROSTAT) 0.4 MG SL tablet Place 1 tablet (0.4 mg total) under the tongue every 5 (five) minutes as needed for chest pain. 25 tablet 1   sitaGLIPtin (JANUVIA) 100 MG tablet Take 1 tablet (100 mg total) by mouth daily. 30 tablet 1   No current facility-administered medications on file prior to visit.    Allergies  Allergen Reactions   Crestor [Rosuvastatin] Other (See Comments)    rhabdomyolosis   Trulicity [Dulaglutide] Nausea And Vomiting   Bee Venom Swelling    SWELLING REACTION UNSPECIFIED     Assessment/Plan:  1. Hypertriglyceridemia - TG above goal <150 mg/dL. Medication adherence appears optimal with fenofibrate 145 mg daily and Zetia 10 mg daily. Patient has tried gemfibrozil and  rosuvastatin in the past, however agents were discontinued to myalgias and statin-induced rhabdomyolysis. Patient has severely elevated triglycerides 2,420 mg/dL (10/25/19) and significant cardiovascular disease. Will starting Vascepa 2 g twice daily for triglyceride reduction, 3 month copay $3. Discussed importance of medication adherence. Discussed importance of avoiding alcohol, and reducing sugar and carbohydrate intake, and encouraged patient to manage his diabetes with A1C goal of <7%. Patient verbalized understanding. Encouraged patient to aim for a diet full of vegetables, fruit and lean meats (chicken, [place], fish) and to limit carbs (bread, pasta, sugar, rice), red meat consumption, and sugary foods/drinks. Provided patient with paperwork to collect labs at [facility] in 3 months.  1. Hyperlipidemia - Rechecking baseline  LDL today as last LDL was uncalculated due to severely elevated triglycerides. LDL goal set for <55 mg/dL due to progressive cardiovascular disease (CAD, ischemic cardiomyopathy, CHF, CVA, T2DM and CKD. Medication adherence appears optimal with Zetia 10 mg daily. If LDL above goal, will send prior authorization for a PCSK9-inhibitor due to history of statin-induced rhabdomyolysis. Discussed the clinical benefits, potential side-effects, and proper injection technique with PCSK9-inhibitors.  Lorel Monaco, PharmD PGY2 [facility] [address] [place], [place] [address] Phone: [phone]; Fax: [phone]

## 2019-12-23 ENCOUNTER — Ambulatory Visit (INDEPENDENT_AMBULATORY_CARE_PROVIDER_SITE_OTHER): Payer: Medicaid Other | Admitting: Neurology

## 2019-12-23 ENCOUNTER — Other Ambulatory Visit: Payer: Self-pay

## 2019-12-23 ENCOUNTER — Ambulatory Visit (INDEPENDENT_AMBULATORY_CARE_PROVIDER_SITE_OTHER): Payer: Medicaid Other | Admitting: Pharmacist

## 2019-12-23 ENCOUNTER — Encounter: Payer: Medicaid Other | Admitting: Neurology

## 2019-12-23 DIAGNOSIS — E782 Mixed hyperlipidemia: Secondary | ICD-10-CM | POA: Diagnosis not present

## 2019-12-23 DIAGNOSIS — R29898 Other symptoms and signs involving the musculoskeletal system: Secondary | ICD-10-CM | POA: Diagnosis not present

## 2019-12-23 DIAGNOSIS — Z8739 Personal history of other diseases of the musculoskeletal system and connective tissue: Secondary | ICD-10-CM

## 2019-12-23 DIAGNOSIS — G5622 Lesion of ulnar nerve, left upper limb: Secondary | ICD-10-CM | POA: Diagnosis not present

## 2019-12-23 DIAGNOSIS — Z0289 Encounter for other administrative examinations: Secondary | ICD-10-CM

## 2019-12-23 DIAGNOSIS — Z789 Other specified health status: Secondary | ICD-10-CM

## 2019-12-23 DIAGNOSIS — R200 Anesthesia of skin: Secondary | ICD-10-CM | POA: Diagnosis not present

## 2019-12-23 MED ORDER — ICOSAPENT ETHYL 1 G PO CAPS: 2.0000 g | ORAL_CAPSULE | Freq: Two times a day (BID) | ORAL | 3 refills | Status: DC

## 2019-12-23 MED ORDER — VASCEPA 1 G PO CAPS: 2.0000 g | ORAL_CAPSULE | Freq: Two times a day (BID) | ORAL | 3 refills | Status: DC

## 2019-12-23 MED ORDER — GABAPENTIN 300 MG PO CAPS: ORAL_CAPSULE | ORAL | 11 refills | Status: AC

## 2019-12-23 NOTE — Progress Notes (Signed)
Full Name: Devin [name] Gender: Male MRN #: [id number] Date of Birth: 09/26/1970    Visit Date: 12/23/2019 09:26 Age: 49 Years Examining Physician: Arlice Colt, MD  Referring Physician: Arlice Colt, MD Height: 6 feet 5 inch    History: [name] is a 49 year old man with left arm numbness and weakness.  He had a stroke with left-sided weakness several years ago.  He had carotid stenting about 2 to 3 years ago and reported that he had more weakness and the onset of numbness after the procedure.  The weakness improved but the numbness remained.  He reports that the numbness involves the third fourth and fifth finger of the left arm and there is no involvement of the thumb or index finger.  The weakness was mostly in the hand.  Currently on examination, he has very reduced sensation to pinprick in the fourth and fifth fingers and mild reduced sensation in the third finger of the left hand.  Intrinsic hand muscles are 4/5 on the left.  The APB is 4+/5 in the right.  Nerve conduction studies: Bilateral median motor responses had delayed distal latencies with normal amplitudes and normal forearm conduction.  The left median nerve was slower than the right.  Bilateral ulnar motor responses had normal distal latencies, amplitudes and conduction velocities.  Ulnar F-wave latencies were delayed.   Bilateral median sensory responses were absent.  Left ulnar sensory response was absent and the right ulnar sensory response was normal.  Electromyography: Needle EMG of selected muscles of the left arm and both hands was performed.  Mild chronic denervation was noted in the left triceps, first dorsal interosseous, abductor pollicis brevis, abductor digiti minimize, flexor carpi ulnaris.  Some polyphasic motor unit with more normal recruitment was noted in the left extensor digitorum communis, pronator teres and extensor indicis proprius muscle.  A few fibrillation potentials and positive sharp  waves were noted in the first dorsal interosseous.  Other muscles did not have spontaneous activity.  In the right hand, there were a few polyphasic motor units in the first dorsal interosseous.  The APB muscle was normal.  Recruitment was normal in both muscles.  Impression: This NCV/EMG study shows the following: 1.   Bilateral moderate median neuropathies, left worse than right. 2.   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  Only one muscle had minimal acute denervation changes. 3.   Mild superimposed left C7 radiculopathy cannot be ruled out.  Landrum Carbonell A. Felecia Shelling, MD, PhD, FAAN Certified in Neurology, Clinical Neurophysiology, Sleep Medicine, Pain Medicine and Neuroimaging Director, [place] at [facility] [address] [place], [place] [address] [phone]   Verbal informed consent was obtained from the patient, patient was informed of potential risk of procedure, including bruising, bleeding, hematoma formation, infection, muscle weakness, muscle pain, numbness, among others.        [place]    Nerve / Sites Muscle Latency Ref. Amplitude Ref. Rel Amp Segments Distance Velocity Ref. Area    ms ms mV mV %  cm m/s m/s mVms  R Median - APB     Wrist APB 4.7 ?4.4 10.9 ?4.0 100 Wrist - APB 7   40.1     Upper arm APB 10.1  11.7  108 Upper arm - Wrist 26 49 ?49 45.6  L Median - APB     Wrist APB 5.2 ?  4.4 9.9 ?4.0 100 Wrist - APB 7   30.3     Upper arm APB 10.5  8.5  85.7 Upper arm - Wrist 26 49 ?49 26.8  R Ulnar - ADM     Wrist ADM 3.2 ?3.3 10.6 ?6.0 100 Wrist - ADM 7   36.5     B.Elbow ADM 8.1  9.7  91.8 B.Elbow - Wrist 24 49 ?49 35.9     A.Elbow ADM 10.2  7.0  72.4 A.Elbow - B.Elbow 10 49 ?49 28.5  L Ulnar - ADM     Wrist ADM 3.2 ?3.3 9.3 ?6.0 100 Wrist - ADM 7   34.4     B.Elbow ADM 8.1  9.2  99.5 B.Elbow - Wrist 24 49 ?49 33.9     A.Elbow  ADM 10.1  9.3  101 A.Elbow - B.Elbow 10 49 ?49 35.4             SNC    Nerve / Sites Rec. Site Peak Lat Ref.  Amp Ref. Segments Distance    ms ms V V  cm  R Median - Orthodromic (Dig II, Mid palm)     Dig II Wrist NR ?3.4 NR ?10 Dig II - Wrist 14  L Median - Orthodromic (Dig II, Mid palm)     Dig II Wrist NR ?3.4 NR ?10 Dig II - Wrist 13  R Ulnar - Orthodromic, (Dig V, Mid palm)     Dig V Wrist 3.1 ?3.1 6 ?5 Dig V - Wrist 11  L Ulnar - Orthodromic, (Dig V, Mid palm)     Dig V Wrist NR ?3.1 NR ?5 Dig V - Wrist 72             F  Wave    Nerve F Lat Ref.   ms ms  R Ulnar - ADM 36.0 ?32.0  L Ulnar - ADM 36.8 ?32.0         EMG Summary Table    Spontaneous MUAP Recruitment  Muscle IA Fib PSW Fasc Other Amp Dur. Poly Pattern  L. Deltoid Normal None None None _______ Normal Normal Normal Normal  L. Triceps brachii Normal None None None _______ Increased Increased 1+ Reduced  L. Biceps brachii Normal None None None _______ Normal Normal Normal Normal  L. Extensor digitorum communis Normal None None None _______ Increased Normal 1+ Normal  L. Pronator teres Normal None None None _______ Normal Normal 1+ Normal  L. First dorsal interosseous Normal 1+ 1+ None _______ Increased Increased 1+ Reduced  L. Abductor pollicis brevis Normal None None None _______ Normal Increased 1+ Reduced  L. Abductor digiti minimi (manus) Normal None None None _______ Normal Normal 1+ Reduced  L. Flexor carpi ulnaris Normal None None None _______ Increased Increased 1+ Reduced  R. First dorsal interosseous Normal None None None _______ Normal Normal 1+ Normal  R. Abductor pollicis brevis Normal None None None _______ Normal Normal Normal Normal  L. Extensor indicis proprius Normal None None None _______ Normal Increased 1+ Normal        [facility]  PATIENT: Devin [name] DOB: 1970/07/02  REFERRING DOCTOR OR PCP: [facility] SOURCE: Patient, notes from primary care, imaging  reports and MRI and CTA images personally reviewed  _________________________________   HISTORICAL  CHIEF COMPLAINT:  Chief Complaint  Patient presents with  . Numbness  . Cerebrovascular Accident    HISTORY OF PRESENT ILLNESS:  He is a 49 year old man who has numbness  and pain in the left arm.    The numbness is mostly in the upper arm down to the 3rd through 5th fingers.   Most of the time, the numbness does not hurt but it becomes more painful at times, especially at night.  He has intermittent neck pain and tingling.    He had a stroke August 2019 that has affected the left strength.  This followed stents in the heart.    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 [name]).   He had multiple small pontine/left occipital lobe/cerebellum strokes 01/06/2018.   He reports that the symptoms started after the stents  His left leg gets intermittent numbness but has bothered him much less than the hand.  He had more weakness in the leg after the stroke but he can walk without a cane.  Vascular risk factors:   He has essential HTN, Type 2 NIDDM and tobacco abuse (1 ppd).   He has been on Plavix and bASA since 2019  imaging studies: CTA head/neck 10/15/2019 showed patent stent from left V4 to basilar artery and stable occluded distal right V4 segment.  MRI of the brain and MR angiogram 07/28/2018: MRI   MR angiogram shows occlusion of the distal right vertebral artery and normal flow in the left vertebral artery and the basilar artery  MRI of the brain January 06, 2018 showed several very small strokes in the distribution of the left vertebral and basilar artery.  REVIEW OF SYSTEMS: Constitutional: No fevers, chills, sweats, or change in appetite Eyes: No visual changes, double vision, eye pain Ear, nose and throat: No hearing loss, ear pain, nasal congestion, sore  throat Cardiovascular: No chest pain, palpitations Respiratory: No shortness of breath at rest or with exertion.   No wheezes GastrointestinaI: No nausea, vomiting, diarrhea, abdominal pain, fecal incontinence Genitourinary: No dysuria, urinary retention or frequency.  No nocturia. Musculoskeletal: No neck pain, back pain Integumentary: No rash, pruritus, skin lesions Neurological: as above Psychiatric: No depression at this time.  No anxiety Endocrine: No palpitations, diaphoresis, change in appetite, change in weigh or increased thirst Hematologic/Lymphatic: No anemia, purpura, petechiae. Allergic/Immunologic: No itchy/runny eyes, nasal congestion, recent allergic reactions, rashes  ALLERGIES: Allergies  Allergen Reactions  . Crestor [Rosuvastatin] Other (See Comments)    rhabdomyolosis  . Trulicity [Dulaglutide] Nausea And Vomiting  . Bee Venom Swelling    SWELLING REACTION UNSPECIFIED     HOME MEDICATIONS:  Current Outpatient Medications:  .  aspirin 81 MG EC tablet, Take 1 tablet (81 mg total) by mouth daily. With food, Disp: 30 tablet, Rfl: 0 .  Blood Glucose Monitoring Suppl w/Device KIT, Dispense based on patient and insurance preference. Check blood sugar daily before breakfast (ICD9 250.0), Disp: 1 kit, Rfl: 1 .  clopidogrel (PLAVIX) 75 MG tablet, Take 1 tablet (75 mg total) by mouth daily., Disp: 30 tablet, Rfl: 1 .  DULERA 200-5 MCG/ACT AERO, Inhale 2 puffs into the lungs 2 (two) times daily., Disp: 8.8 g, Rfl: 1 .  escitalopram (LEXAPRO) 10 MG tablet, Take 10 mg by mouth daily., Disp: , Rfl:  .  EUTHYROX 100 MCG tablet, Take 1 tablet (100 mcg total) by mouth daily., Disp: 30 tablet, Rfl: 1 .  ezetimibe (ZETIA) 10 MG tablet, Take 1 tablet (10 mg total) by mouth daily., Disp: 30 tablet, Rfl: 1 .  famotidine (PEPCID) 20 MG tablet, Take 20 mg by mouth 2 (two)  times daily., Disp: , Rfl:  .  fenofibrate (TRICOR) 145 MG tablet, Take 1 tablet (145 mg total) by mouth daily.,  Disp: 30 tablet, Rfl: 1 .  gabapentin (NEURONTIN) 300 MG capsule, One po q AM, one po q PM and 2 po qHS, Disp: 120 capsule, Rfl: 11 .  glipiZIDE (GLUCOTROL XL) 5 MG 24 hr tablet, Take 1 tablet (5 mg total) by mouth daily with breakfast., Disp: 30 tablet, Rfl: 3 .  insulin glargine (LANTUS SOLOSTAR) 100 UNIT/ML Solostar Pen, Inject 50 Units into the skin daily., Disp: 15 mL, Rfl: 2 .  Insulin Pen Needle (B-D ULTRAFINE III SHORT PEN) 31G X 8 MM MISC, 1 each by Does not apply route as directed., Disp: 100 each, Rfl: 3 .  losartan (COZAAR) 25 MG tablet, Take 0.5 tablets (12.5 mg total) by mouth daily., Disp: 15 tablet, Rfl: 1 .  nitroGLYCERIN (NITROSTAT) 0.4 MG SL tablet, Place 1 tablet (0.4 mg total) under the tongue every 5 (five) minutes as needed for chest pain., Disp: 25 tablet, Rfl: 1 .  sitaGLIPtin (JANUVIA) 100 MG tablet, Take 1 tablet (100 mg total) by mouth daily., Disp: 30 tablet, Rfl: 1  PAST MEDICAL HISTORY: Past Medical History:  Diagnosis Date  . Asthma   . Chest pain   . CHF (congestive heart failure) ([place])   . Coronary artery disease    a. s/p DES x2 to LAD and DES to OM in 11/2017  . Depression   . Dyspnea   . GERD (gastroesophageal reflux disease)   . Headache   . History of kidney stones   . History of rhabdomyolysis   . Hyperlipidemia   . Hypertension   . Hypothyroidism   . Ischemic cardiomyopathy    a. 11/2017: echo showing EF of 35-40%, diffuse HK, and Grade 2 DD  . Kidney calculus 2014  . Pericardial effusion    Small, by dobutamine echocardiogram, 04/2006  . PONV (postoperative nausea and vomiting)   . Sleep apnea    does not wear cpap  . Stroke ([place])   . Tobacco abuse   . Type 2 diabetes mellitus without complications ([place]) 10/21/9145  . Vitamin D deficiency     PAST SURGICAL HISTORY: Past Surgical History:  Procedure Laterality Date  . APPENDECTOMY    . CARDIAC CATHETERIZATION  09/2012   "Nonobstructive CAD with 30% proximal, 40% mid LAD disease; 30%  proximal CFX; EF 55-65%"  . CHOLECYSTECTOMY N/A 01/27/2013   Procedure: LAPAROSCOPIC CHOLECYSTECTOMY;  Surgeon: Jamesetta So, MD;  Location: [facility];  Service: General;  Laterality: N/A;  . CORONARY STENT INTERVENTION N/A 11/15/2017   Procedure: CORONARY STENT INTERVENTION;  Surgeon: Leonie Man, MD;  Location: [place] [facility];  Service: Cardiovascular;  Laterality: N/A;  . HERNIA REPAIR     As a child  . INSERTION OF MESH N/A 11/13/2012   Procedure: INSERTION OF MESH;  Surgeon: Jamesetta So, MD;  Location: [facility];  Service: General;  Laterality: N/A;  . IR ANGIO INTRA EXTRACRAN SEL COM CAROTID INNOMINATE BILAT MOD SED  12/01/2017  . IR ANGIO INTRA EXTRACRAN SEL COM CAROTID INNOMINATE BILAT MOD SED  02/13/2018  . IR ANGIO VERTEBRAL SEL SUBCLAVIAN INNOMINATE UNI L MOD SED  02/13/2018  . IR ANGIO VERTEBRAL SEL VERTEBRAL BILAT MOD SED  12/01/2017  . IR ANGIO VERTEBRAL SEL VERTEBRAL UNI R MOD SED  02/13/2018  . IR INTRA CRAN STENT  12/03/2017  . IR PTA INTRACRANIAL  02/25/2018  . IR  US GUIDE VASC ACCESS RIGHT  02/13/2018  . KIDNEY STONE SURGERY    . LEFT HEART CATH AND CORONARY ANGIOGRAPHY N/A 11/15/2017   Procedure: LEFT HEART CATH AND CORONARY ANGIOGRAPHY;  Surgeon: Leonie Man, MD;  Location: [place] [facility];  Service: Cardiovascular;  Laterality: N/A;  . PERCUTANEOUS NEPHROLITHOTRIPSY    . RADIOLOGY WITH ANESTHESIA Left 12/03/2017   Procedure: Angioplasty with possible stenting of left VBJ;  Surgeon: Luanne Bras, MD;  Location: [place];  Service: Radiology;  Laterality: Left;  . RADIOLOGY WITH ANESTHESIA N/A 02/25/2018   Procedure: STENT PLACEMENT;  Surgeon: Luanne Bras, MD;  Location: [place];  Service: Radiology;  Laterality: N/A;  . UMBILICAL HERNIA REPAIR N/A 11/13/2012   Procedure: UMBILICAL HERNIORRHAPHY;  Surgeon: Jamesetta So, MD;  Location: [facility];  Service: General;  Laterality: N/A;  . VARICOCELECTOMY      FAMILY HISTORY: Family History  Problem Relation Age  of Onset  . Coronary artery disease Father   . Hypertension Father   . Hyperlipidemia Father   . Diabetes Father   . Congestive Heart Failure Father 38  . Arrhythmia Father        had an ICD  . Diabetes Mother   . Hypertension Mother   . Hyperlipidemia Mother   . Obesity Mother 75       died after bariatric surgery, liver failure and infection  . Coronary artery disease Maternal Grandfather        both grandfathers and several uncles  . Coronary artery disease Other   . Diabetes Sister        both sisters  . Stroke Maternal Aunt   . Stroke Maternal Uncle       PHYSICAL EXAM  There were no vitals filed for this visit.  There is no height or weight on file to calculate BMI.   General: The patient is well-developed and well-nourished and in no acute distress  HEENT:  Head is [place]/AT.  Sclera are anicteric.   Skin: Extremities are without rash or  edema.   Neurologic Exam  Mental status: The patient is alert and oriented x 3 at the time of the examination. The patient has apparent normal recent and remote memory, with an apparently normal attention span and concentration ability.   Speech is normal.  Cranial nerves: Extraocular movements are full.  Facial strength is normal.  Motor:  Muscle bulk is normal.   Tone is normal. Strength is  5 / 5 on the right except for 4+/5 APB strength.  On the left, strength is 4/5 in the intrinsic hand muscles and 4+/5 in the triceps.   Sensory: He has reduced sensation to touch over the 3rd 4th and 5th finger in the left hand and the adjacent palm.  Coordination: He has reduced left finger-nose-finger   Gait and station: Station is normal.   Gait is mildly wide.  Tandem is poor.. Romberg is negative.   Reflexes: Deep tendon reflexes are increased in the left leg relative to the right leg..          ASSESSMENT AND PLAN  Arm numbness left - Plan: NCV with EMG(electromyography)  Left arm weakness - Plan: NCV with  EMG(electromyography)  Ulnar neuropathy of left upper extremity - Plan: NCV with EMG(electromyography)   1.   I discussed the findings of the NCV/EMG study with him and his sister. 2.   Gabapentin titrate up to a dose of 300 mg in the morning, 300 mg in  the afternoon/evening and 600 mg at night.  This dose can be adjusted if needed. 3.   Stay active and exercise as tolerated. 4.   If symptoms worsen, consider imaging of the left brachial plexus.   5.   Call if there are any new or worsening neurologic symptoms.   Vitali Seibert A. Felecia Shelling, MD, [facility] 92/43/8365, 42:71 PM Certified in Neurology, Clinical Neurophysiology, Sleep Medicine and Neuroimaging  [facility] [address] [place], [place] [address] [phone]

## 2019-12-23 NOTE — Patient Instructions (Addendum)
Nice to see you today!  Keep up the good work with diet and exercise. Aim for a diet full of vegetables, fruit and lean meats (chicken, [place], fish). Try to limit carbs (bread, pasta, sugar, rice) and red meat consumption.  Your goal LDL is <55 mg/dL.  Medication Changes: Once approved from your insurance, we will call you when Lovaza and Repatha are ready for pick up and will also schedule fasting lipids and liver function tests in 3 months  Vascepa 2 g twice daily  Repatha 140 mg every two weeks  Continue Zetia 10 mg daily and fenofibrate 145 mg daily   Please give [name] a call at [phone] with any questions or concerns.  For PCSK9i, inject once every other week (any day of the week that works for you) into the fatty skin of stomach, upper outer thigh or back of the arm. Clean the site with soap and warm water or an alcohol pad. Keep the medication in the fridge until you are ready to give your dose, then take it out and let warm up to room temperature for 30-60 mins.

## 2019-12-24 ENCOUNTER — Telehealth: Payer: Self-pay | Admitting: Pharmacist

## 2019-12-24 DIAGNOSIS — E785 Hyperlipidemia, unspecified: Secondary | ICD-10-CM

## 2019-12-24 LAB — LIPID PANEL
Chol/HDL Ratio: 9.4 ratio — ABNORMAL HIGH (ref 0.0–5.0)
Cholesterol, Total: 235 mg/dL — ABNORMAL HIGH (ref 100–199)
HDL: 25 mg/dL — ABNORMAL LOW (ref >39)
LDL Chol Calc (NIH): 150 mg/dL — ABNORMAL HIGH (ref 0–99)
Triglycerides: 321 mg/dL — ABNORMAL HIGH (ref 0–149)
VLDL Cholesterol Cal: 60 mg/dL — ABNORMAL HIGH (ref 5–40)

## 2019-12-24 LAB — HEPATIC FUNCTION PANEL
ALT: 24 IU/L (ref 0–44)
AST: 13 IU/L (ref 0–40)
Albumin: 4.4 g/dL (ref 4.0–5.0)
Alkaline Phosphatase: 81 IU/L (ref 44–121)
Bilirubin Total: <0.2 mg/dL (ref 0.0–1.2)
Bilirubin, Direct: <0.1 mg/dL (ref 0.00–0.40)
Total Protein: 7 g/dL (ref 6.0–8.5)

## 2019-12-24 LAB — LDL CHOLESTEROL, DIRECT: LDL Direct: 143 mg/dL — ABNORMAL HIGH (ref 0–99)

## 2019-12-24 MED ORDER — REPATHA SURECLICK 140 MG/ML ~~LOC~~ SOAJ: 1.0000 "pen " | SUBCUTANEOUS | 11 refills | Status: AC

## 2019-12-24 NOTE — Telephone Encounter (Signed)
Left voicemail for patient and his sister regarding Repatha approval. Prescription sent to pharmacy with a copay cost of $3. Provided patient with paperwork yesterday to obtain fasting lipids and LFTs in 3 months.  Fabio Neighbors, PharmD PGY2 Ambulatory Care Resident [facility]arm[facility]

## 2019-12-26 ENCOUNTER — Telehealth (HOSPITAL_COMMUNITY): Payer: Self-pay

## 2019-12-26 ENCOUNTER — Other Ambulatory Visit: Payer: Self-pay

## 2019-12-26 DIAGNOSIS — E1159 Type 2 diabetes mellitus with other circulatory complications: Secondary | ICD-10-CM

## 2019-12-30 ENCOUNTER — Other Ambulatory Visit: Payer: Self-pay

## 2019-12-30 ENCOUNTER — Ambulatory Visit (HOSPITAL_COMMUNITY)
Admission: RE | Admit: 2019-12-30 | Discharge: 2019-12-30 | Disposition: A | Discharge status: Home / Self Care | Payer: Medicaid Other | Source: Ambulatory Visit | Attending: Vascular Surgery | Admitting: Vascular Surgery

## 2019-12-30 ENCOUNTER — Ambulatory Visit (HOSPITAL_COMMUNITY)
Admission: RE | Admit: 2019-12-30 | Discharge: 2019-12-30 | Disposition: A | Discharge status: Home / Self Care | Payer: Medicaid Other | Source: Ambulatory Visit | Attending: "Endocrinology | Admitting: "Endocrinology

## 2019-12-30 DIAGNOSIS — E038 Other specified hypothyroidism: Secondary | ICD-10-CM | POA: Diagnosis not present

## 2019-12-30 DIAGNOSIS — E1159 Type 2 diabetes mellitus with other circulatory complications: Secondary | ICD-10-CM | POA: Diagnosis present

## 2020-01-05 ENCOUNTER — Encounter: Payer: Medicaid Other | Admitting: Vascular Surgery

## 2020-01-07 ENCOUNTER — Telehealth: Payer: Self-pay | Admitting: *Deleted

## 2020-01-07 NOTE — Telephone Encounter (Signed)
Pt sister voiced understanding - is f/u with pharmacist for repeat lab work

## 2020-01-07 NOTE — Telephone Encounter (Signed)
-----   Message from Antoine Poche, MD sent at 01/07/2020  3:45 PM EDT ----- Cholesterol elevated, we will see how it responds on the new medication pharmacy helped start  Dominga Ferry MD

## 2020-01-26 ENCOUNTER — Other Ambulatory Visit: Payer: Self-pay

## 2020-01-26 ENCOUNTER — Ambulatory Visit (INDEPENDENT_AMBULATORY_CARE_PROVIDER_SITE_OTHER): Payer: Medicaid Other | Admitting: Vascular Surgery

## 2020-01-26 ENCOUNTER — Encounter: Payer: Self-pay | Admitting: Vascular Surgery

## 2020-01-26 VITALS — BP 102/63 | HR 84 | Temp 97.9°F | Resp 16 | Ht 78.0 in | Wt 288.0 lb

## 2020-01-26 DIAGNOSIS — R6889 Other general symptoms and signs: Secondary | ICD-10-CM

## 2020-01-26 NOTE — Progress Notes (Signed)
[facility] [place]  Patient name: Devin [name] MRN: [id number] DOB: 1971/03/13 Sex: male  REASON FOR CONSULT: Evaluation abnormal ankle arm index screen  HPI: Devin [name] is a 49 y.o. male, here today for discussion of potential lower extremity arterial insufficiency with abnormal ankle arm index screen buddy.  He does have some symptoms of lower extremity discomfort.  This is mainly cramping in his calves at night and also reports some pain in his left foot with walking.  Apparently his screening studies suggested some potential right lower extremity arterial insufficiency.  He has no history of lower extremity tissue loss.  Does have a history of diabetes.  He has an extensive past cardiac and cerebrovascular history.  He has had coronary stenting for critical disease in 2019.  Also has had several strokes with intracranial intervention and stenting.  Unfortunately continues to smoke 1 pack cigarettes per day  Past Medical History:  Diagnosis Date  . Asthma   . Chest pain   . CHF (congestive heart failure) ([place])   . Coronary artery disease    a. s/p DES x2 to LAD and DES to OM in 11/2017  . Depression   . Dyspnea   . GERD (gastroesophageal reflux disease)   . Headache   . History of kidney stones   . History of rhabdomyolysis   . Hyperlipidemia   . Hypertension   . Hypothyroidism   . Ischemic cardiomyopathy    a. 11/2017: echo showing EF of 35-40%, diffuse HK, and Grade 2 DD  . Kidney calculus 2014  . Pericardial effusion    Small, by dobutamine echocardiogram, 04/2006  . PONV (postoperative nausea and vomiting)   . Sleep apnea    does not wear cpap  . Stroke ([place])   . Tobacco abuse   . Type 2 diabetes mellitus without complications ([place]) 05/11/6008  . Vitamin D deficiency     Family History  Problem Relation Age of Onset  . Coronary artery disease Father   . Hypertension Father   . Hyperlipidemia Father   . Diabetes Father   .  Congestive Heart Failure Father 64  . Arrhythmia Father        had an ICD  . Diabetes Mother   . Hypertension Mother   . Hyperlipidemia Mother   . Obesity Mother 84       died after bariatric surgery, liver failure and infection  . Coronary artery disease Maternal Grandfather        both grandfathers and several uncles  . Coronary artery disease Other   . Diabetes Sister        both sisters  . Stroke Maternal Aunt   . Stroke Maternal Uncle     SOCIAL HISTORY: Social History   Socioeconomic History  . Marital status: Divorced    Spouse name: Not on file  . Number of children: Not on file  . Years of education: Not on file  . Highest education level: Not on file  Occupational History  . Occupation: Disability     Comment:      Employer: [employer]  Tobacco Use  . Smoking status: Current Every Day Smoker    Packs/day: 1.00    Years: 26.00    Pack years: 26.00    Types: Cigarettes    Start date: 03/13/1985  . Smokeless tobacco: Former Systems developer    Types: Snuff, Sarina Ser    Quit date: 03/14/1987  Vaping Use  . Vaping  Use: Former  . Devices: quit in 2011  Substance and Sexual Activity  . Alcohol use: No  . Drug use: No  . Sexual activity: Not on file  Other Topics Concern  . Not on file  Social History Narrative   Eats fast food daily   No exercise outside of work   Lives in [place] with his dog   MAINTENANCE TECHNICIAN   Caffeine use: tea/soda daily   Left handed    Social Determinants of Health   Financial Resource Strain:   . Difficulty of Paying Living Expenses: Not on file  Food Insecurity:   . Worried About Charity fundraiser in the Last Year: Not on file  . Ran Out of Food in the Last Year: Not on file  Transportation Needs:   . Lack of Transportation (Medical): Not on file  . Lack of Transportation (Non-Medical): Not on file  Physical Activity:   . Days of Exercise per Week: Not on file  . Minutes of Exercise per Session: Not on file  Stress:   . Feeling of  Stress : Not on file  Social Connections:   . Frequency of Communication with Friends and Family: Not on file  . Frequency of Social Gatherings with Friends and Family: Not on file  . Attends Religious Services: Not on file  . Active Member of Clubs or Organizations: Not on file  . Attends Archivist Meetings: Not on file  . Marital Status: Not on file  Intimate Partner Violence:   . Fear of Current or Ex-Partner: Not on file  . Emotionally Abused: Not on file  . Physically Abused: Not on file  . Sexually Abused: Not on file    Allergies  Allergen Reactions  . Crestor [Rosuvastatin] Other (See Comments)    rhabdomyolosis  . Trulicity [Dulaglutide] Nausea And Vomiting  . Bee Venom Swelling    SWELLING REACTION UNSPECIFIED     Current Outpatient Medications  Medication Sig Dispense Refill  . aspirin 81 MG EC tablet Take 1 tablet (81 mg total) by mouth daily. With food 30 tablet 0  . Blood Glucose Monitoring Suppl w/Device KIT Dispense based on patient and insurance preference. Check blood sugar daily before breakfast (ICD9 250.0) 1 kit 1  . buPROPion (WELLBUTRIN SR) 150 MG 12 hr tablet Take by mouth.    . clopidogrel (PLAVIX) 75 MG tablet Take 1 tablet (75 mg total) by mouth daily. 30 tablet 1  . DULERA 200-5 MCG/ACT AERO Inhale 2 puffs into the lungs 2 (two) times daily. 8.8 g 1  . escitalopram (LEXAPRO) 10 MG tablet Take 10 mg by mouth daily.    . EUTHYROX 100 MCG tablet Take 1 tablet (100 mcg total) by mouth daily. 30 tablet 1  . Evolocumab (REPATHA SURECLICK) 540 MG/ML SOAJ Inject 1 pen into the skin every 14 (fourteen) days. 1 mL 11  . ezetimibe (ZETIA) 10 MG tablet Take 1 tablet (10 mg total) by mouth daily. 30 tablet 1  . famotidine (PEPCID) 40 MG tablet Take 40 mg by mouth daily.    . fenofibrate (TRICOR) 145 MG tablet Take 1 tablet (145 mg total) by mouth daily. 30 tablet 1  . gabapentin (NEURONTIN) 300 MG capsule One po q AM, one po q PM and 2 po qHS 120 capsule  11  . glipiZIDE (GLUCOTROL XL) 5 MG 24 hr tablet Take 1 tablet (5 mg total) by mouth daily with breakfast. 30 tablet 3  . insulin glargine (LANTUS  SOLOSTAR) 100 UNIT/ML Solostar Pen Inject 50 Units into the skin daily. 15 mL 2  . Insulin Pen Needle (B-D ULTRAFINE III SHORT PEN) 31G X 8 MM MISC 1 each by Does not apply route as directed. 100 each 3  . losartan (COZAAR) 25 MG tablet Take 0.5 tablets (12.5 mg total) by mouth daily. 15 tablet 1  . nitroGLYCERIN (NITROSTAT) 0.4 MG SL tablet Place 1 tablet (0.4 mg total) under the tongue every 5 (five) minutes as needed for chest pain. 25 tablet 1  . sitaGLIPtin (JANUVIA) 100 MG tablet Take 1 tablet (100 mg total) by mouth daily. 30 tablet 1  . VASCEPA 1 g capsule Take 2 capsules (2 g total) by mouth 2 (two) times daily. 360 capsule 3   No current facility-administered medications for this visit.    REVIEW OF SYSTEMS:  [X]  denotes positive finding, [ ]  denotes negative finding Cardiac  Comments:  Chest pain or chest pressure:    Shortness of breath upon exertion: x   Short of breath when lying flat: x   Irregular heart rhythm:        Vascular    Pain in calf, thigh, or hip brought on by ambulation: x   Pain in feet at night that wakes you up from your sleep:  x   Blood clot in your veins:    Leg swelling:  x       Pulmonary    Oxygen at home:    Productive cough:     Wheezing:  x       Neurologic    Sudden weakness in arms or legs:  x   Sudden numbness in arms or legs:  x   Sudden onset of difficulty speaking or slurred speech:    Temporary loss of vision in one eye:     Problems with dizziness:         Gastrointestinal    Blood in stool:     Vomited blood:         Genitourinary    Burning when urinating:     Blood in urine:        Psychiatric    Major depression:         Hematologic    Bleeding problems:    Problems with blood clotting too easily:        Skin    Rashes or ulcers:        Constitutional    Fever or  chills:      PHYSICAL EXAM: Vitals:   01/26/20 0824  BP: 102/63  Pulse: 84  Resp: 16  Temp: 97.9 F (36.6 C)  TempSrc: Other (Comment)  SpO2: 95%  Weight: 288 lb (130.6 kg)  Height: 6' 6"  (1.981 m)    GENERAL: The patient is a well-nourished male, in no acute distress. The vital signs are documented above. VASCULAR: 2+ radial and 2+ dorsalis pedis pulses bilaterally PULMONARY: There is good air exchange ABDOMEN: Soft and non-tender  MUSCULOSKELETAL: There are no major deformities or cyanosis. NEUROLOGIC: No focal weakness or paresthesias are detected. SKIN: There are no ulcers or rashes noted.  Does have dry scaling skin but no tissue loss PSYCHIATRIC: The patient has a normal affect.  DATA:   [facility]invasive [facility] at [facility] on 12/30/2019 were reviewed with the patient.  This shows normal ankle arm index and normal triphasic waveforms bilaterally  MEDICAL ISSUES:  Extensive cardiac and intracranial's peripheral disease.  Does not have any evidence of  peripheral vascular occlusive disease.  I discussed the critical importance of smoking cessation with the patient.  Explained that there is nothing else he can do more important for his health.  He was reassured with this discussion and will see [name] again on an as-needed basis   Rosetta Posner, MD [facility] [place] Office phone [phone]

## 2020-01-27 ENCOUNTER — Ambulatory Visit: Payer: Medicaid Other | Admitting: "Endocrinology

## 2020-01-29 ENCOUNTER — Ambulatory Visit: Payer: Medicaid Other | Admitting: Cardiology

## 2020-01-30 ENCOUNTER — Encounter: Payer: Self-pay | Admitting: "Endocrinology

## 2020-01-30 ENCOUNTER — Ambulatory Visit (INDEPENDENT_AMBULATORY_CARE_PROVIDER_SITE_OTHER): Payer: Medicaid Other | Admitting: "Endocrinology

## 2020-01-30 ENCOUNTER — Other Ambulatory Visit: Payer: Self-pay

## 2020-01-30 VITALS — BP 128/78 | HR 72 | Ht 78.0 in | Wt 281.0 lb

## 2020-01-30 DIAGNOSIS — E1159 Type 2 diabetes mellitus with other circulatory complications: Secondary | ICD-10-CM

## 2020-01-30 DIAGNOSIS — E559 Vitamin D deficiency, unspecified: Secondary | ICD-10-CM

## 2020-01-30 DIAGNOSIS — E039 Hypothyroidism, unspecified: Secondary | ICD-10-CM

## 2020-01-30 LAB — POCT UA - MICROALBUMIN
Creatinine, POC: 300 mg/dL
Microalbumin Ur, POC: 80 mg/L

## 2020-01-30 LAB — POCT GLYCOSYLATED HEMOGLOBIN (HGB A1C): HbA1c, POC (controlled diabetic range): 10.2 % — AB (ref 0.0–7.0)

## 2020-01-30 MED ORDER — LANTUS SOLOSTAR 100 UNIT/ML ~~LOC~~ SOPN: 60.0000 [IU] | PEN_INJECTOR | Freq: Every day | SUBCUTANEOUS | 2 refills | Status: DC

## 2020-01-30 NOTE — Progress Notes (Signed)
01/30/2020, 10:09 AM  Endocrinology follow-up note   Subjective:    Patient ID: Devin [name], male    DOB: 16-Jun-1970.  Devin [name] is being seen in follow-up after he was seen in consultation for management of currently uncontrolled symptomatic diabetes requested by  Litchfield Nation, MD.   Past Medical History:  Diagnosis Date  . Asthma   . Chest pain   . CHF (congestive heart failure) ([place])   . Coronary artery disease    a. s/p DES x2 to LAD and DES to OM in 11/2017  . Depression   . Dyspnea   . GERD (gastroesophageal reflux disease)   . Headache   . History of kidney stones   . History of rhabdomyolysis   . Hyperlipidemia   . Hypertension   . Hypothyroidism   . Ischemic cardiomyopathy    a. 11/2017: echo showing EF of 35-40%, diffuse HK, and Grade 2 DD  . Kidney calculus 2014  . Pericardial effusion    Small, by dobutamine echocardiogram, 04/2006  . PONV (postoperative nausea and vomiting)   . Sleep apnea    does not wear cpap  . Stroke ([place])   . Tobacco abuse   . Type 2 diabetes mellitus without complications ([place]) 9/60/4540  . Vitamin D deficiency     Past Surgical History:  Procedure Laterality Date  . APPENDECTOMY    . 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 CHOLECYSTECTOMY N/A 01/27/2013   Procedure: LAPAROSCOPIC CHOLECYSTECTOMY;  Surgeon: Jamesetta So, MD;  Location: [facility];  Service: General;  Laterality: N/A;  . CORONARY STENT INTERVENTION N/A 11/15/2017   Procedure: CORONARY STENT INTERVENTION;  Surgeon: Leonie Man, MD;  Location: [facility];  Service: Cardiovascular;  Laterality: N/A;  . HERNIA REPAIR     As a child  . INSERTION OF MESH N/A 11/13/2012   Procedure: INSERTION OF MESH;  Surgeon: Jamesetta So, MD;  Location: [facility];  Service: General;  Laterality: N/A;   . IR ANGIO INTRA EXTRACRAN SEL COM CAROTID INNOMINATE BILAT MOD SED  12/01/2017  . IR ANGIO INTRA EXTRACRAN SEL COM CAROTID INNOMINATE BILAT MOD SED  02/13/2018  . IR ANGIO VERTEBRAL SEL SUBCLAVIAN INNOMINATE UNI L MOD SED  02/13/2018  . IR ANGIO VERTEBRAL SEL VERTEBRAL BILAT MOD SED  12/01/2017  . IR ANGIO VERTEBRAL SEL VERTEBRAL UNI R MOD SED  02/13/2018  . IR INTRA CRAN STENT  12/03/2017  . IR PTA INTRACRANIAL  02/25/2018  . IR US GUIDE VASC ACCESS RIGHT  02/13/2018  . KIDNEY STONE SURGERY    . LEFT HEART CATH AND CORONARY ANGIOGRAPHY N/A 11/15/2017   Procedure: LEFT HEART CATH AND CORONARY ANGIOGRAPHY;  Surgeon: Leonie Man, MD;  Location: [facility];  Service: Cardiovascular;  Laterality: N/A;  . PERCUTANEOUS NEPHROLITHOTRIPSY    . RADIOLOGY WITH ANESTHESIA Left 12/03/2017   Procedure: Angioplasty with possible stenting of left VBJ;  Surgeon: Luanne Bras, MD;  Location: [facility];  Service: Radiology;  Laterality: Left;  . RADIOLOGY WITH ANESTHESIA N/A 02/25/2018  Procedure: STENT PLACEMENT;  Surgeon: Luanne Bras, MD;  Location: [place] [facility];  Service: Radiology;  Laterality: N/A;  . UMBILICAL HERNIA REPAIR N/A 11/13/2012   Procedure: UMBILICAL HERNIORRHAPHY;  Surgeon: Jamesetta So, MD;  Location: [facility];  Service: General;  Laterality: N/A;  . VARICOCELECTOMY      Social History   Socioeconomic History  . Marital status: Divorced    Spouse name: Not on file  . Number of children: Not on file  . Years of education: Not on file  . Highest education level: Not on file  Occupational History  . Occupation: Disability     Comment:      Employer: [employer]  Tobacco Use  . Smoking status: Current Every Day Smoker    Packs/day: 1.00    Years: 26.00    Pack years: 26.00    Types: Cigarettes    Start date: 03/13/1985  . Smokeless tobacco: Former Systems developer    Types: Snuff, Sarina Ser    Quit date: 03/14/1987  Vaping Use  . Vaping Use: Former  . Devices: quit in 2011  Substance and  Sexual Activity  . Alcohol use: No  . Drug use: No  . Sexual activity: Not on file  Other Topics Concern  . Not on file  Social History Narrative   Eats fast food daily   No exercise outside of work   Lives in [place] with his dog   MAINTENANCE TECHNICIAN   Caffeine use: tea/soda daily   Left handed    Social Determinants of Health   Financial Resource Strain:   . Difficulty of Paying Living Expenses: Not on file  Food Insecurity:   . Worried About Charity fundraiser in the Last Year: Not on file  . Ran Out of Food in the Last Year: Not on file  Transportation Needs:   . Lack of Transportation (Medical): Not on file  . Lack of Transportation (Non-Medical): Not on file  Physical Activity:   . Days of Exercise per Week: Not on file  . Minutes of Exercise per Session: Not on file  Stress:   . Feeling of Stress : Not on file  Social Connections:   . Frequency of Communication with Friends and Family: Not on file  . Frequency of Social Gatherings with Friends and Family: Not on file  . Attends Religious Services: Not on file  . Active Member of Clubs or Organizations: Not on file  . Attends Archivist Meetings: Not on file  . Marital Status: Not on file    Family History  Problem Relation Age of Onset  . Coronary artery disease Father   . Hypertension Father   . Hyperlipidemia Father   . Diabetes Father   . Congestive Heart Failure Father 74  . Arrhythmia Father        had an ICD  . Diabetes Mother   . Hypertension Mother   . Hyperlipidemia Mother   . Obesity Mother 63       died after bariatric surgery, liver failure and infection  . Coronary artery disease Maternal Grandfather        both grandfathers and several uncles  . Coronary artery disease Other   . Diabetes Sister        both sisters  . Stroke Maternal Aunt   . Stroke Maternal Uncle     Outpatient Encounter Medications as of 01/30/2020  Medication Sig  . aspirin 81 MG EC tablet Take 1 tablet  (81 mg total)  by mouth daily. With food  . Blood Glucose Monitoring Suppl w/Device KIT Dispense based on patient and insurance preference. Check blood sugar daily before breakfast (ICD9 250.0)  . buPROPion (WELLBUTRIN SR) 150 MG 12 hr tablet Take by mouth.  . clopidogrel (PLAVIX) 75 MG tablet Take 1 tablet (75 mg total) by mouth daily.  . DULERA 200-5 MCG/ACT AERO Inhale 2 puffs into the lungs 2 (two) times daily.  Marland Kitchen escitalopram (LEXAPRO) 10 MG tablet Take 10 mg by mouth daily.  . EUTHYROX 100 MCG tablet Take 1 tablet (100 mcg total) by mouth daily.  . Evolocumab (REPATHA SURECLICK) 671 MG/ML SOAJ Inject 1 pen into the skin every 14 (fourteen) days.  Marland Kitchen ezetimibe (ZETIA) 10 MG tablet Take 1 tablet (10 mg total) by mouth daily.  . famotidine (PEPCID) 40 MG tablet Take 40 mg by mouth daily.  . fenofibrate (TRICOR) 145 MG tablet Take 1 tablet (145 mg total) by mouth daily.  Marland Kitchen gabapentin (NEURONTIN) 300 MG capsule One po q AM, one po q PM and 2 po qHS  . glipiZIDE (GLUCOTROL XL) 5 MG 24 hr tablet Take 1 tablet (5 mg total) by mouth daily with breakfast.  . insulin glargine (LANTUS SOLOSTAR) 100 UNIT/ML Solostar Pen Inject 60 Units into the skin at bedtime.  . Insulin Pen Needle (B-D ULTRAFINE III SHORT PEN) 31G X 8 MM MISC 1 each by Does not apply route as directed.  Marland Kitchen losartan (COZAAR) 25 MG tablet Take 0.5 tablets (12.5 mg total) by mouth daily.  . nitroGLYCERIN (NITROSTAT) 0.4 MG SL tablet Place 1 tablet (0.4 mg total) under the tongue every 5 (five) minutes as needed for chest pain.  . sitaGLIPtin (JANUVIA) 100 MG tablet Take 1 tablet (100 mg total) by mouth daily.  Marland Kitchen VASCEPA 1 g capsule Take 2 capsules (2 g total) by mouth 2 (two) times daily.  . [DISCONTINUED] insulin glargine (LANTUS SOLOSTAR) 100 UNIT/ML Solostar Pen Inject 50 Units into the skin daily.   No facility-administered encounter medications on file as of 01/30/2020.    ALLERGIES: Allergies  Allergen Reactions  . Crestor  [Rosuvastatin] Other (See Comments)    rhabdomyolosis  . Trulicity [Dulaglutide] Nausea And Vomiting  . Bee Venom Swelling    SWELLING REACTION UNSPECIFIED     VACCINATION STATUS: Immunization History  Administered Date(s) Administered  . Tdap 12/12/2015    Diabetes He presents for his follow-up diabetic visit. He has type 2 diabetes mellitus. Onset time: He was diagnosed at approximate age of 8 years. His disease course has been worsening. There are no hypoglycemic associated symptoms. Pertinent negatives for hypoglycemia include no confusion, headaches, pallor or seizures. Associated symptoms include foot paresthesias, polydipsia and polyuria. Pertinent negatives for diabetes include no chest pain, no fatigue, no polyphagia and no weakness. There are no hypoglycemic complications. Symptoms are improving. Diabetic complications include a CVA, heart disease and PVD. Risk factors for coronary artery disease include diabetes mellitus, dyslipidemia, family history, obesity, male sex, tobacco exposure and sedentary lifestyle. Current diabetic treatments: He is currently on Lantus 15 units nightly, Januvia 100 mg daily, Amaryl 2 mg p.o. daily. His weight is decreasing steadily. He is following a generally unhealthy diet. When asked about meal planning, he reported none. He has had a previous visit with a dietitian. He never participates in exercise. His home blood glucose trend is decreasing steadily. (He did not bring his logs nor meter to review.  His point-of-care A1c is 10.2% improving from 13.8%.  He denies  hypoglycemia.  Lowest reading was 110 according to his report.   ) An ACE inhibitor/angiotensin II receptor blocker is being taken. He does not see a podiatrist.Eye exam is current.  Hyperlipidemia This is a chronic problem. The current episode started more than 1 year ago. The problem is uncontrolled. Exacerbating diseases include diabetes, hypothyroidism and obesity. Associated symptoms  include myalgias. Pertinent negatives include no chest pain or shortness of breath. Current antihyperlipidemic treatment includes ezetimibe. Risk factors for coronary artery disease include dyslipidemia, diabetes mellitus, hypertension, male sex, family history, obesity and a sedentary lifestyle.  Hypertension This is a chronic problem. The current episode started more than 1 year ago. The problem is controlled. Pertinent negatives include no chest pain, headaches, neck pain, palpitations or shortness of breath. Risk factors for coronary artery disease include diabetes mellitus, dyslipidemia, family history, obesity, male gender, sedentary lifestyle and smoking/tobacco exposure. Past treatments include angiotensin blockers. Hypertensive end-organ damage includes CAD/MI, CVA, heart failure and PVD. Identifiable causes of hypertension include a thyroid problem.  Thyroid Problem Presents for initial visit. Onset time: He has taken levothyroxine for at least 2 years. Patient reports no constipation, diarrhea, fatigue or palpitations. The symptoms have been stable. Past treatments include levothyroxine. His past medical history is significant for diabetes, heart failure and hyperlipidemia. Risk factors include family history of goiter.     Review of Systems  Constitutional: Negative for chills, fatigue, fever and unexpected weight change.  HENT: Negative for dental problem, mouth sores and trouble swallowing.   Eyes: Negative for visual disturbance.  Respiratory: Negative for cough, choking, chest tightness, shortness of breath and wheezing.   Cardiovascular: Negative for chest pain, palpitations and leg swelling.  Gastrointestinal: Negative for abdominal distention, abdominal pain, constipation, diarrhea, nausea and vomiting.  Endocrine: Positive for polydipsia and polyuria. Negative for polyphagia.  Genitourinary: Negative for dysuria, flank pain, hematuria and urgency.  Musculoskeletal: Positive for  myalgias. Negative for back pain, gait problem and neck pain.  Skin: Negative for pallor, rash and wound.  Neurological: Negative for seizures, syncope, weakness, numbness and headaches.  Psychiatric/Behavioral: Negative.  Negative for confusion and dysphoric mood.    Objective:    Vitals with BMI 01/30/2020 01/26/2020 12/19/2019  Height 6' 6"  6' 6"  6' 5"   Weight 281 lbs 288 lbs 282 lbs  BMI 32.48 29.47 65.46  Systolic 503 546 568  Diastolic 78 63 73  Pulse 72 84 71    BP 128/78   Pulse 72   Ht 6' 6"  (1.981 m)   Wt 281 lb (127.5 kg)   BMI 32.47 kg/m   Wt Readings from Last 3 Encounters:  01/30/20 281 lb (127.5 kg)  01/26/20 288 lb (130.6 kg)  12/19/19 282 lb (127.9 kg)     Physical Exam Constitutional:      General: He is not in acute distress.    Appearance: He is well-developed.  HENT:     Head: Normocephalic and atraumatic.  Neck:     Thyroid: No thyromegaly.     Trachea: No tracheal deviation.  Cardiovascular:     Rate and Rhythm: Normal rate.     Pulses:          Dorsalis pedis pulses are 1+ on the right side and 1+ on the left side.       Posterior tibial pulses are 1+ on the right side and 1+ on the left side.     Heart sounds: Normal heart sounds, S1 normal and S2 normal. No murmur heard.  No gallop.   Pulmonary:     Effort: Pulmonary effort is normal. No respiratory distress.     Breath sounds: Normal breath sounds. No wheezing.  Abdominal:     General: There is no distension.     Tenderness: There is no abdominal tenderness. There is no guarding.  Musculoskeletal:     Right shoulder: No swelling or deformity.     Cervical back: Normal range of motion and neck supple.  Skin:    General: Skin is warm and dry.     Findings: No rash.     Nails: There is no clubbing.  Neurological:     Mental Status: He is alert and oriented to person, place, and time.     Cranial Nerves: No cranial nerve deficit.     Sensory: No sensory deficit.     Gait: Gait  normal.     Deep Tendon Reflexes: Reflexes are normal and symmetric.  Psychiatric:        Speech: Speech normal.        Behavior: Behavior normal. Behavior is cooperative.        Thought Content: Thought content normal.        Judgment: Judgment normal.     CMP ( most recent) CMP     Component Value Date/Time   NA 131 (L) 10/15/2019 0046   K 3.7 10/15/2019 0046   CL 93 (L) 10/15/2019 0046   CO2 26 10/15/2019 0046   GLUCOSE 380 (H) 10/15/2019 0046   BUN 14 10/15/2019 0046   CREATININE 1.10 10/15/2019 0728   CALCIUM 10.1 10/15/2019 0046   PROT 7.0 12/23/2019 1512   ALBUMIN 4.4 12/23/2019 1512   AST 13 12/23/2019 1512   ALT 24 12/23/2019 1512   ALKPHOS 81 12/23/2019 1512   BILITOT <0.2 12/23/2019 1512   GFRNONAA >60 10/15/2019 0728   GFRAA >60 10/15/2019 0728     Diabetic Labs (most recent): Lab Results  Component Value Date   HGBA1C 10.2 (A) 01/30/2020   HGBA1C 13.8 (H) 10/15/2019   HGBA1C 7.2 (H) 02/11/2018     Lipid Panel ( most recent) Lipid Panel     Component Value Date/Time   CHOL 235 (H) 12/23/2019 1512   TRIG 321 (H) 12/23/2019 1512   HDL 25 (L) 12/23/2019 1512   CHOLHDL 9.4 (H) 12/23/2019 1512   CHOLHDL NOT REPORTED DUE TO HIGH TRIGLYCERIDES 10/15/2019 0728   VLDL UNABLE TO CALCULATE IF TRIGLYCERIDE OVER 400 mg/dL 10/15/2019 0728   LDLCALC 150 (H) 12/23/2019 1512   LDLDIRECT 143 (H) 12/23/2019 1511   LDLDIRECT UNABLE TO CALCULATE IF TRIGLYCERIDE IS >1293 mg/dL 10/15/2019 0728   LABVLDL 60 (H) 12/23/2019 1512      Lab Results  Component Value Date   TSH 83.261 (H) 10/15/2019   TSH 60.657 (H) 01/05/2018   TSH 84.910 (H) 11/13/2017   TSH >100.000 (H) 10/06/2013   FREET4 <0.25 (L) 10/15/2019   FREET4 0.24 (L) 10/06/2013       Assessment & Plan:   1. DM type 2 causing vascular disease ([place])  - Lisa Roca has currently uncontrolled symptomatic type 2 DM since  49 years of age. He did not bring his logs nor meter to review.  His  point-of-care A1c is 10.2% improving from 13.8%.  He denies hypoglycemia.  Lowest reading was 110 according to his report.    - Recent labs reviewed. - I had a long discussion with him about the progressive nature of diabetes and the pathology  behind its complications. -his diabetes is complicated by coronary artery disease, CVA, heart failure, peripheral artery disease, obesity/sedentary life, smoking and he remains at a high risk for more acute and chronic complications which include CAD, CVA, CKD, retinopathy, and neuropathy. These are all discussed in detail with him.  - I have counseled him on diet  and weight management  by adopting a carbohydrate restricted/protein rich diet. Patient is encouraged to switch to  unprocessed or minimally processed     complex starch and increased protein intake (animal or plant source), fruits, and vegetables. -  he is advised to stick to a routine mealtimes to eat 3 meals  a day and avoid unnecessary snacks ( to snack only to correct hypoglycemia).   - he  admits there is a room for improvement in his diet and drink choices. -  Suggestion is made for him to avoid simple carbohydrates  from his diet including Cakes, Sweet Desserts / Pastries, Ice Cream, Soda (diet and regular), Sweet Tea, Candies, Chips, Cookies, Sweet Pastries,  Store Bought Juices, Alcohol in Excess of  1-2 drinks a day, Artificial Sweeteners, Coffee Creamer, and "Sugar-free" Products. This will help patient to have stable blood glucose profile and potentially avoid unintended weight gain.  - he will be scheduled with Jearld Fenton, RDN, CDE for diabetes education.  - I have approached him with the following individualized plan to manage  his diabetes and patient agrees:   -In light of his chronic glycemic burden, he will continue to need insulin treatment in order for him to achieve control of diabetes to target.  However, based on his presentation with near target glycemic profile he will  not need prandial insulin for now.    -He is advised to increase Lantus to 60 units nightly,  continue to monitor blood glucose twice a day-daily before breakfast and at bedtime.  - he is warned not to take insulin without proper monitoring per orders.  - he is encouraged to call clinic for blood glucose levels less than 70 or above 300 mg /dl. - he is advised to continue Januvia 100 mg p.o. daily, therapeutically suitable for patient . -He is advised to continue glipizide 5 mg XL p.o. daily at breakfast will be restarted.   - Specific targets for  A1c;  LDL, HDL,  and Triglycerides were discussed with the patient.  2) Blood Pressure /Hypertension:   His blood pressure is controlled to target.  he is advised to continue his current medications including losartan 25 mg p.o. daily with breakfast . 3) Lipids/Hyperlipidemia:   Review of his recent lipid panel showed improved hypertriglyceridemia to 321 from 2400+.      He does not tolerate statins.  Patient is on Zetia 10 mg p.o. daily, as well as fenofibrate 145 mg p.o. daily at bedtime, Repatha 140 mg subcutaneously every 14 days,and Vascepa 2 g p.o. twice daily.  His fenofibrate will be discontinued if he achieves triglyceride levels below 150. -  I discussed with the patient the fact that diabetes management with insulin will also help with hypertriglyceridemia.   4)  Weight/Diet:  Body mass index is 32.47 kg/m.  -   clearly complicating his diabetes care.   he is  a candidate for weight loss. I discussed with him the fact that loss of 5 - 10% of his  current body weight will have the most impact on his diabetes management.  Exercise, and detailed carbohydrates information provided  -  detailed on discharge  instructions.  5) hypothyroidism She has questionable compliance.  The circumstance of his diagnosis are not available to review.  He is on levothyroxine 100 mcg p.o. daily before breakfast.     - We discussed about the correct intake of  his thyroid hormone, on empty stomach at fasting, with water, separated by at least 30 minutes from breakfast and other medications,  and separated by more than 4 hours from calcium, iron, multivitamins, acid reflux medications (PPIs). -Patient is made aware of the fact that thyroid hormone replacement is needed for life, dose to be adjusted by periodic monitoring of thyroid function tests.    6) vitamin D deficiency He is advised to continue ergocalciferol 50,000 units weekly for 12 weeks.  7) Chronic Care/Health Maintenance:  -he  is on ARB and did not tolerate statin medications and  is encouraged to initiate and continue to follow up with Ophthalmology, Dentist,  Podiatrist at least yearly or according to recommendations, and advised to  quit smoking. I have recommended yearly flu vaccine and pneumonia vaccine at least every 5 years; moderate intensity exercise for up to 150 minutes weekly; and  sleep for at least 7 hours a day.   POCT ABI Results on December 11, 2019.  His ABI is abnormal. Right ABI: 1.43      left ABI: 1.32.  Right leg systolic / diastolic: 163/846 mmHg Left leg systolic / diastolic: 659/93 mmHg  Arm systolic / diastolic: 570/17 mmHG Evaluated by vascular surgery, no significant occlusive peripheral arterial disease was detected.  - he is  advised to maintain close follow up with Lakeshire Nation, MD for primary care needs, as well as his other providers for optimal and coordinated care.  - Time spent on this patient care encounter:  35 min, of which > 50% was spent in  counseling and the rest reviewing his blood glucose logs , discussing his hypoglycemia and hyperglycemia episodes, reviewing his current and  previous labs / studies  ( including abstraction from other facilities) and medications  doses and developing a  long term treatment plan and documenting his care.   Please refer to Patient Instructions for Blood Glucose Monitoring and Insulin/Medications  Dosing Guide"  in media tab for additional information. Please  also refer to " Patient Self Inventory" in the Media  tab for reviewed elements of pertinent patient history.  Lisa Roca participated in the discussions, expressed understanding, and voiced agreement with the above plans.  All questions were answered to his satisfaction. he is encouraged to contact clinic should he have any questions or concerns prior to his return visit.    Follow up plan: - Return in about 3 months (around 05/01/2020) for F/U with Pre-visit Labs, Meter, Logs, A1c here.Glade Lloyd, MD [facility] [address] [place], [place] [address] Phone: [phone]  Fax: [phone]    01/30/2020, 10:09 AM  This note was partially dictated with voice recognition software. Similar sounding words can be transcribed inadequately or may not  be corrected upon review.

## 2020-01-30 NOTE — Patient Instructions (Signed)

## 2020-02-18 LAB — VITAMIN D 25 HYDROXY (VIT D DEFICIENCY, FRACTURES): Vit D, 25-Hydroxy: 12.4

## 2020-02-18 LAB — TSH: TSH: 49.1 — AB (ref 0.41–5.90)

## 2020-02-18 LAB — HEMOGLOBIN A1C: Hemoglobin A1C: 10.3

## 2020-04-05 LAB — TSH: TSH: 29 — AB (ref 0.41–5.90)

## 2020-05-03 ENCOUNTER — Ambulatory Visit: Payer: Medicaid Other | Admitting: "Endocrinology

## 2020-05-12 DIAGNOSIS — Z1211 Encounter for screening for malignant neoplasm of colon: Secondary | ICD-10-CM | POA: Insufficient documentation

## 2020-05-13 DIAGNOSIS — E785 Hyperlipidemia, unspecified: Secondary | ICD-10-CM | POA: Diagnosis not present

## 2020-05-13 DIAGNOSIS — J449 Chronic obstructive pulmonary disease, unspecified: Secondary | ICD-10-CM | POA: Diagnosis not present

## 2020-05-13 DIAGNOSIS — I10 Essential (primary) hypertension: Secondary | ICD-10-CM | POA: Diagnosis not present

## 2020-05-13 DIAGNOSIS — I2510 Atherosclerotic heart disease of native coronary artery without angina pectoris: Secondary | ICD-10-CM | POA: Diagnosis not present

## 2020-05-13 DIAGNOSIS — E1159 Type 2 diabetes mellitus with other circulatory complications: Secondary | ICD-10-CM | POA: Diagnosis not present

## 2020-05-13 DIAGNOSIS — E1140 Type 2 diabetes mellitus with diabetic neuropathy, unspecified: Secondary | ICD-10-CM | POA: Diagnosis not present

## 2020-05-13 DIAGNOSIS — F32A Depression, unspecified: Secondary | ICD-10-CM | POA: Diagnosis not present

## 2020-05-13 DIAGNOSIS — E039 Hypothyroidism, unspecified: Secondary | ICD-10-CM | POA: Diagnosis not present

## 2020-05-20 ENCOUNTER — Encounter: Payer: Self-pay | Admitting: Cardiology

## 2020-05-20 ENCOUNTER — Ambulatory Visit (INDEPENDENT_AMBULATORY_CARE_PROVIDER_SITE_OTHER): Payer: Medicare Other | Admitting: Cardiology

## 2020-05-20 VITALS — BP 128/80 | HR 71 | Ht 78.0 in | Wt 276.0 lb

## 2020-05-20 DIAGNOSIS — I255 Ischemic cardiomyopathy: Secondary | ICD-10-CM | POA: Diagnosis not present

## 2020-05-20 DIAGNOSIS — I2510 Atherosclerotic heart disease of native coronary artery without angina pectoris: Secondary | ICD-10-CM

## 2020-05-20 DIAGNOSIS — E782 Mixed hyperlipidemia: Secondary | ICD-10-CM | POA: Diagnosis not present

## 2020-05-20 DIAGNOSIS — K219 Gastro-esophageal reflux disease without esophagitis: Secondary | ICD-10-CM

## 2020-05-20 MED ORDER — PANTOPRAZOLE SODIUM 40 MG PO TBEC: 40.0000 mg | DELAYED_RELEASE_TABLET | Freq: Every day | ORAL | 1 refills | Status: DC

## 2020-05-20 NOTE — Patient Instructions (Signed)
Your physician recommends that you schedule a follow-up appointment in: 6 MONTHS WITH DR [facility]  Your physician has recommended you make the following change in your medication:   STOP PEPCID   START PROTONIX 40 MG DAILY   Thank you for choosing  [facility]!!

## 2020-05-20 NOTE — Progress Notes (Signed)
Clinical Summary Mr. Matousek is a 50 y.o.male seen today for follow up of the following medical problems.     1. CAD/Chest pain/Chronic systolic HF - admitted 09/5641 with chest pain concerning for unstable angina.  11/2017 coronary CTA with severe prox LAD disease and possible mid LAD disease and D1.  11/2017 cath LM patent, LAD mid 80 to 90%, LCX distal 90%, OM2 90%, LPDA 90%, RCA small with prox 99% LVgram 35-45% (no echo on file).DES x 2to LAD, DES to OM, trifurcation lesion distal LCX, LPL and PDA recs for medical therapy, poor pci target. RCA small vessel treated medically.  Interventional cards recommended extended DAPT  01/2018 echo: LVEF 35-40%, grade I diastoluc dysfunction - bradycardia on beta blocketers. History of rhabdo on crestor.Low bp and dizzienss on entresto.    10/2018 nuclear stress: no clear ischemia. LVEF 46% - compliant with meds - no exertional chest pain symptoms.   10/2019 echo  LVEF 50-55%, no WMAs, indet diastolic fxn, nomral RV  - 1-2 hours burning feeling mid chest, better with milk.  - chronic stable SOB. Occasional edema. Coughing/wheezing.   2. COPD - abnormal PFTs 02/2019 - on dulera, followed by pcp   2. Hyperlipidemia - history of rhabdo on crestor - previously extraordinarily high TGs in setting of poorly controlled DM2, hypothyroidism -followed in lipid clinic. On repatha, zetia, fenofirbate, vascepa 12/2019 TC 235 TG 321 HDL 25 LDL 150. Direct LDL 143   3. History of CVA - history of CVA 11/2017 - hadunderwent an image-guided diagnostic cerebral angiogram 12/01/2017 with Dr. 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 He then underwent an image-guided cerebral angiogram with revascularization of his vertebrobasilar junction/proximal basilar artery stenosis using stent assisted angioplasty 12/03/2017 by Dr. [name] - later had ISR requiring repeat  procedure 02/2018     SH: has 2 boxer dogs at home, 19 and 3.  Has not been in favor of getting covid vaccine.    Past Medical History:  Diagnosis Date  . Asthma   . Chest pain   . CHF (congestive heart failure) ([place])   . Coronary artery disease    a. s/p DES x2 to LAD and DES to OM in 11/2017  . Depression   . Dyspnea   . GERD (gastroesophageal reflux disease)   . Headache   . History of kidney stones   . History of rhabdomyolysis   . Hyperlipidemia   . Hypertension   . Hypothyroidism   . Ischemic cardiomyopathy    a. 11/2017: echo showing EF of 35-40%, diffuse HK, and Grade 2 DD  . Kidney calculus 2014  . Pericardial effusion    Small, by dobutamine echocardiogram, 04/2006  . PONV (postoperative nausea and vomiting)   . Sleep apnea    does not wear cpap  . Stroke ([place])   . Tobacco abuse   . Type 2 diabetes mellitus without complications ([place]) 06/09/5186  . Vitamin D deficiency      Allergies  Allergen Reactions  . Crestor [Rosuvastatin] Other (See Comments)    rhabdomyolosis  . Trulicity [Dulaglutide] Nausea And Vomiting  . Bee Venom Swelling    SWELLING REACTION UNSPECIFIED      Current Outpatient Medications  Medication Sig Dispense Refill  . aspirin 81 MG EC tablet Take 1 tablet (81 mg total) by mouth daily. With food 30 tablet 0  . Blood Glucose Monitoring Suppl w/Device KIT Dispense based on patient and insurance preference. Check blood  sugar daily before breakfast (ICD9 250.0) 1 kit 1  . buPROPion (WELLBUTRIN SR) 150 MG 12 hr tablet Take by mouth.    . clopidogrel (PLAVIX) 75 MG tablet Take 1 tablet (75 mg total) by mouth daily. 30 tablet 1  . DULERA 200-5 MCG/ACT AERO Inhale 2 puffs into the lungs 2 (two) times daily. 8.8 g 1  . escitalopram (LEXAPRO) 10 MG tablet Take 10 mg by mouth daily.    . EUTHYROX 100 MCG tablet Take 1 tablet (100 mcg total) by mouth daily. 30 tablet 1  . Evolocumab (REPATHA SURECLICK) 277 MG/ML SOAJ Inject 1 pen into the  skin every 14 (fourteen) days. 1 mL 11  . ezetimibe (ZETIA) 10 MG tablet Take 1 tablet (10 mg total) by mouth daily. 30 tablet 1  . famotidine (PEPCID) 40 MG tablet Take 40 mg by mouth daily.    . fenofibrate (TRICOR) 145 MG tablet Take 1 tablet (145 mg total) by mouth daily. 30 tablet 1  . gabapentin (NEURONTIN) 300 MG capsule One po q AM, one po q PM and 2 po qHS 120 capsule 11  . glipiZIDE (GLUCOTROL XL) 5 MG 24 hr tablet Take 1 tablet (5 mg total) by mouth daily with breakfast. 30 tablet 3  . insulin glargine (LANTUS SOLOSTAR) 100 UNIT/ML Solostar Pen Inject 60 Units into the skin at bedtime. 15 mL 2  . Insulin Pen Needle (B-D ULTRAFINE III SHORT PEN) 31G X 8 MM MISC 1 each by Does not apply route as directed. 100 each 3  . losartan (COZAAR) 25 MG tablet Take 0.5 tablets (12.5 mg total) by mouth daily. 15 tablet 1  . nitroGLYCERIN (NITROSTAT) 0.4 MG SL tablet Place 1 tablet (0.4 mg total) under the tongue every 5 (five) minutes as needed for chest pain. 25 tablet 1  . sitaGLIPtin (JANUVIA) 100 MG tablet Take 1 tablet (100 mg total) by mouth daily. 30 tablet 1  . VASCEPA 1 g capsule Take 2 capsules (2 g total) by mouth 2 (two) times daily. 360 capsule 3   No current facility-administered medications for this visit.     Past Surgical History:  Procedure Laterality Date  . APPENDECTOMY    . 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 CHOLECYSTECTOMY N/A 01/27/2013   Procedure: LAPAROSCOPIC CHOLECYSTECTOMY;  Surgeon: Jamesetta So, MD;  Location: [facility];  Service: General;  Laterality: N/A;  . CORONARY STENT INTERVENTION N/A 11/15/2017   Procedure: CORONARY STENT INTERVENTION;  Surgeon: Leonie Man, MD;  Location: [facility];  Service: Cardiovascular;  Laterality: N/A;  . HERNIA REPAIR     As a child  . INSERTION OF MESH N/A 11/13/2012   Procedure: INSERTION OF MESH;  Surgeon: Jamesetta So, MD;   Location: [facility];  Service: General;  Laterality: N/A;  . IR ANGIO INTRA EXTRACRAN SEL COM CAROTID INNOMINATE BILAT MOD SED  12/01/2017  . IR ANGIO INTRA EXTRACRAN SEL COM CAROTID INNOMINATE BILAT MOD SED  02/13/2018  . IR ANGIO VERTEBRAL SEL SUBCLAVIAN INNOMINATE UNI L MOD SED  02/13/2018  . IR ANGIO VERTEBRAL SEL VERTEBRAL BILAT MOD SED  12/01/2017  . IR ANGIO VERTEBRAL SEL VERTEBRAL UNI R MOD SED  02/13/2018  . IR INTRA CRAN STENT  12/03/2017  . IR PTA INTRACRANIAL  02/25/2018  . IR US GUIDE VASC ACCESS RIGHT  02/13/2018  . KIDNEY STONE SURGERY    .  LEFT HEART CATH AND CORONARY ANGIOGRAPHY N/A 11/15/2017   Procedure: LEFT HEART CATH AND CORONARY ANGIOGRAPHY;  Surgeon: Leonie Man, MD;  Location: [facility];  Service: Cardiovascular;  Laterality: N/A;  . PERCUTANEOUS NEPHROLITHOTRIPSY    . RADIOLOGY WITH ANESTHESIA Left 12/03/2017   Procedure: Angioplasty with possible stenting of left VBJ;  Surgeon: Luanne Bras, MD;  Location: [facility];  Service: Radiology;  Laterality: Left;  . RADIOLOGY WITH ANESTHESIA N/A 02/25/2018   Procedure: STENT PLACEMENT;  Surgeon: Luanne Bras, MD;  Location: [facility];  Service: Radiology;  Laterality: N/A;  . UMBILICAL HERNIA REPAIR N/A 11/13/2012   Procedure: UMBILICAL HERNIORRHAPHY;  Surgeon: Jamesetta So, MD;  Location: [facility];  Service: General;  Laterality: N/A;  . VARICOCELECTOMY       Allergies  Allergen Reactions  . Crestor [Rosuvastatin] Other (See Comments)    rhabdomyolosis  . Trulicity [Dulaglutide] Nausea And Vomiting  . Bee Venom Swelling    SWELLING REACTION UNSPECIFIED       Family History  Problem Relation Age of Onset  . Coronary artery disease Father   . Hypertension Father   . Hyperlipidemia Father   . Diabetes Father   . Congestive Heart Failure Father 37  . Arrhythmia Father        had an ICD  . Diabetes Mother   . Hypertension Mother   . Hyperlipidemia Mother   . Obesity Mother 29       died after  bariatric surgery, liver failure and infection  . Coronary artery disease Maternal Grandfather        both grandfathers and several uncles  . Coronary artery disease Other   . Diabetes Sister        both sisters  . Stroke Maternal Aunt   . Stroke Maternal Uncle      Social History Mr. Stamper reports that he has been smoking cigarettes. He started smoking about 35 years ago. He has a 26.00 pack-year smoking history. He quit smokeless tobacco use about 33 years ago.  His smokeless tobacco use included snuff and chew. Mr. Ion reports no history of alcohol use.   Review of Systems CONSTITUTIONAL: No weight loss, fever, chills, weakness or fatigue.  HEENT: Eyes: No visual loss, blurred vision, double vision or yellow sclerae.No hearing loss, sneezing, congestion, runny nose or sore throat.  SKIN: No rash or itching.  CARDIOVASCULAR: per hpi RESPIRATORY: per hpi GASTROINTESTINAL: No anorexia, nausea, vomiting or diarrhea. No abdominal pain or blood.  GENITOURINARY: No burning on urination, no polyuria NEUROLOGICAL: No headache, dizziness, syncope, paralysis, ataxia, numbness or tingling in the extremities. No change in bowel or bladder control.  MUSCULOSKELETAL: No muscle, back pain, joint pain or stiffness.  LYMPHATICS: No enlarged nodes. No history of splenectomy.  PSYCHIATRIC: No history of depression or anxiety.  ENDOCRINOLOGIC: No reports of sweating, cold or heat intolerance. No polyuria or polydipsia.  Marland Kitchen   Physical Examination Today's Vitals   05/20/20 1357  BP: 128/80  Pulse: 71  SpO2: 97%  Weight: 276 lb (125.2 kg)  Height: 6' 6"  (1.981 m)   Body mass index is 31.9 kg/m.  Gen: resting comfortably, no acute distress HEENT: no scleral icterus, pupils equal round and reactive, no palptable cervical adenopathy,  CV: RRR, no m/r/g, no jvd Resp: Clear to auscultation bilaterally GI: abdomen is soft, non-tender, non-distended, normal bowel sounds, no  hepatosplenomegaly MSK: extremities are warm, no edema.  Skin: warm, no rash Neuro:  no focal deficits Psych:  appropriate affect   Diagnostic Studies  11/2017 cath  There is moderate left ventricular systolic dysfunction. The left ventricular ejection fraction is 35-45% by visual estimate.  LV end diastolic pressure is mildly elevated.  -----------------------------------------  ANGIOGRAPHY- PCI  LESION #1: Mid LAD lesion is 80% stenosed.  A drug-eluting stent was successfully placed using a STENT SYNERGY DES 2.25X16. -Postdilated to 2.4 mm  Post intervention, there is a 0% residual stenosis.  LESION #2: mid LAD to Dist LAD lesion is 90% stenosed.  A drug-eluting stent was successfully placed using a STENT SYNERGY DES 2.25X12. -Postdilated to 2.4 mm  Post intervention, there is a 0% residual stenosis.  Prox LAD lesion is 40% stenosed -prior to lesion 1; apical, after lesion 2  A drug-eluting stent was successfully placed using a STENT SYNERGY DES 2.25X12. -Postdilated to 2.4 mm  -----------------------------------------  Post intervention, there is a 0% residual stenosis.  Trifurcation 90% lesion at the distal circumflex into LPL and PDA -small caliber distal vessels. Best treated medically as this is not a very good PCI target  Prox small caliber nondominant RCA lesion is 99% stenosed.  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  Subtotally occluded nondominant RCA Moderately reduced LVEF of roughly 40% with mildly elevated LVEDP.  Plan: Transfer to postprocedure unit for post PCI monitoring and TR removal Continue aggressive risk factor modification with statin and beta-blocker etc.  Recommend uninterrupted dual antiplatelet therapy with Aspirin 36m daily and Ticagrelor 953mtwice dailyfor a minimum of 12 months (ACS -  Class I recommendation).-Would strongly consider reducing to 60 mg dose ticagrelor at 9-12 months and continue for an additional year.  Would be okay to stop aspirin after 3 months if necessary. Okay to stop Brilinta after 6 months for procedures if necessary.   10/2018 nuclear stress  There was no ST segment deviation noted during stress.  Defect 1: There is a small defect of mild severity present in the mid inferoseptal location. This is likely due to soft tissue attenuation artifact.  This is a low risk study. No ischemic zones.  Nuclear stress EF: 46   8/2021echo IMPRESSIONS    1. Left ventricular ejection fraction, by estimation, is 50 to 55%. The  left ventricle has low normal function. The left ventricle has no regional  wall motion abnormalities. There is moderate left ventricular hypertrophy.  Left ventricular diastolic  parameters are indeterminate.  2. Right ventricular systolic function is normal. The right ventricular  size is normal.  3. The mitral valve is normal in structure. No evidence of mitral valve  regurgitation. No evidence of mitral stenosis.  4. The aortic valve is tricuspid. Aortic valve regurgitation is not  visualized. No aortic stenosis is present.  5. The inferior vena cava is normal in size with greater than 50%  respiratory variability, suggesting right atrial pressure of 3 mmHg.   Assessment and Plan   1. CAD/ICM/SOB -recent stress test without ischemia - most recent echo shows LVEF has noramlized - no significant symptoms, continue current meds  2. GERD - significant ongoing symptoms, change pepcid to protonix 4025maily.    3. Hyperlipidemia - marked elevated TGs in setting of severe hypothyroidism and poorly controlled DM2 - continue current regifmen, follows in lipid clinic.      JonArnoldo LenisD

## 2020-06-14 DIAGNOSIS — Z01818 Encounter for other preprocedural examination: Secondary | ICD-10-CM | POA: Diagnosis not present

## 2020-06-17 DIAGNOSIS — D126 Benign neoplasm of colon, unspecified: Secondary | ICD-10-CM | POA: Diagnosis not present

## 2020-06-17 DIAGNOSIS — E119 Type 2 diabetes mellitus without complications: Secondary | ICD-10-CM | POA: Diagnosis not present

## 2020-06-17 DIAGNOSIS — Z7902 Long term (current) use of antithrombotics/antiplatelets: Secondary | ICD-10-CM | POA: Diagnosis not present

## 2020-06-17 DIAGNOSIS — K644 Residual hemorrhoidal skin tags: Secondary | ICD-10-CM | POA: Diagnosis not present

## 2020-06-17 DIAGNOSIS — E669 Obesity, unspecified: Secondary | ICD-10-CM | POA: Diagnosis not present

## 2020-06-17 DIAGNOSIS — E785 Hyperlipidemia, unspecified: Secondary | ICD-10-CM | POA: Diagnosis not present

## 2020-06-17 DIAGNOSIS — G4730 Sleep apnea, unspecified: Secondary | ICD-10-CM | POA: Diagnosis not present

## 2020-06-17 DIAGNOSIS — K635 Polyp of colon: Secondary | ICD-10-CM | POA: Diagnosis not present

## 2020-06-17 DIAGNOSIS — Z1211 Encounter for screening for malignant neoplasm of colon: Secondary | ICD-10-CM | POA: Diagnosis not present

## 2020-06-17 DIAGNOSIS — J449 Chronic obstructive pulmonary disease, unspecified: Secondary | ICD-10-CM | POA: Diagnosis not present

## 2020-06-17 DIAGNOSIS — K649 Unspecified hemorrhoids: Secondary | ICD-10-CM | POA: Diagnosis not present

## 2020-06-24 ENCOUNTER — Other Ambulatory Visit: Payer: Self-pay | Admitting: "Endocrinology

## 2020-06-24 NOTE — Telephone Encounter (Signed)
Please advise, patient was a no show in Feb, last appt was in Nov.

## 2020-06-24 NOTE — Telephone Encounter (Signed)
He has to come back for a visit.  He can get a sample of long-acting insulin pen to cover him until the visit.

## 2020-07-21 DIAGNOSIS — E1140 Type 2 diabetes mellitus with diabetic neuropathy, unspecified: Secondary | ICD-10-CM | POA: Diagnosis not present

## 2020-07-21 DIAGNOSIS — J449 Chronic obstructive pulmonary disease, unspecified: Secondary | ICD-10-CM | POA: Diagnosis not present

## 2020-07-21 DIAGNOSIS — I10 Essential (primary) hypertension: Secondary | ICD-10-CM | POA: Diagnosis not present

## 2020-07-21 DIAGNOSIS — K219 Gastro-esophageal reflux disease without esophagitis: Secondary | ICD-10-CM | POA: Diagnosis not present

## 2020-07-21 DIAGNOSIS — F32A Depression, unspecified: Secondary | ICD-10-CM | POA: Diagnosis not present

## 2020-07-21 DIAGNOSIS — F17200 Nicotine dependence, unspecified, uncomplicated: Secondary | ICD-10-CM | POA: Diagnosis not present

## 2020-07-21 DIAGNOSIS — E039 Hypothyroidism, unspecified: Secondary | ICD-10-CM | POA: Diagnosis not present

## 2020-07-21 DIAGNOSIS — E785 Hyperlipidemia, unspecified: Secondary | ICD-10-CM | POA: Diagnosis not present

## 2020-07-21 DIAGNOSIS — E1159 Type 2 diabetes mellitus with other circulatory complications: Secondary | ICD-10-CM | POA: Diagnosis not present

## 2020-07-21 DIAGNOSIS — I2510 Atherosclerotic heart disease of native coronary artery without angina pectoris: Secondary | ICD-10-CM | POA: Diagnosis not present

## 2020-07-21 LAB — LIPID PANEL
Cholesterol: 265 — AB (ref 0–200)
HDL: 24 — AB (ref 35–70)
LDL Cholesterol: 109
Triglycerides: 741 — AB (ref 40–160)

## 2020-07-21 LAB — TSH: TSH: 12.9 — AB (ref 0.41–5.90)

## 2020-07-21 LAB — VITAMIN D 25 HYDROXY (VIT D DEFICIENCY, FRACTURES): Vit D, 25-Hydroxy: 11.3

## 2020-07-29 DIAGNOSIS — E119 Type 2 diabetes mellitus without complications: Secondary | ICD-10-CM | POA: Diagnosis not present

## 2020-07-29 DIAGNOSIS — H524 Presbyopia: Secondary | ICD-10-CM | POA: Diagnosis not present

## 2020-07-29 DIAGNOSIS — Z0100 Encounter for examination of eyes and vision without abnormal findings: Secondary | ICD-10-CM | POA: Diagnosis not present

## 2020-08-04 DIAGNOSIS — I2510 Atherosclerotic heart disease of native coronary artery without angina pectoris: Secondary | ICD-10-CM | POA: Diagnosis not present

## 2020-08-04 DIAGNOSIS — E785 Hyperlipidemia, unspecified: Secondary | ICD-10-CM | POA: Diagnosis not present

## 2020-08-04 DIAGNOSIS — E1140 Type 2 diabetes mellitus with diabetic neuropathy, unspecified: Secondary | ICD-10-CM | POA: Diagnosis not present

## 2020-08-04 DIAGNOSIS — E039 Hypothyroidism, unspecified: Secondary | ICD-10-CM | POA: Diagnosis not present

## 2020-08-04 DIAGNOSIS — I10 Essential (primary) hypertension: Secondary | ICD-10-CM | POA: Diagnosis not present

## 2020-08-04 DIAGNOSIS — F17200 Nicotine dependence, unspecified, uncomplicated: Secondary | ICD-10-CM | POA: Diagnosis not present

## 2020-08-04 DIAGNOSIS — J449 Chronic obstructive pulmonary disease, unspecified: Secondary | ICD-10-CM | POA: Diagnosis not present

## 2020-08-04 DIAGNOSIS — E1159 Type 2 diabetes mellitus with other circulatory complications: Secondary | ICD-10-CM | POA: Diagnosis not present

## 2020-08-10 ENCOUNTER — Encounter: Payer: Self-pay | Admitting: "Endocrinology

## 2020-08-10 ENCOUNTER — Ambulatory Visit (INDEPENDENT_AMBULATORY_CARE_PROVIDER_SITE_OTHER): Payer: Medicare Other | Admitting: "Endocrinology

## 2020-08-10 ENCOUNTER — Other Ambulatory Visit: Payer: Self-pay

## 2020-08-10 VITALS — BP 120/76 | HR 76 | Ht 78.0 in | Wt 277.8 lb

## 2020-08-10 DIAGNOSIS — E559 Vitamin D deficiency, unspecified: Secondary | ICD-10-CM

## 2020-08-10 DIAGNOSIS — I10 Essential (primary) hypertension: Secondary | ICD-10-CM

## 2020-08-10 DIAGNOSIS — E782 Mixed hyperlipidemia: Secondary | ICD-10-CM | POA: Diagnosis not present

## 2020-08-10 DIAGNOSIS — E1159 Type 2 diabetes mellitus with other circulatory complications: Secondary | ICD-10-CM

## 2020-08-10 DIAGNOSIS — E039 Hypothyroidism, unspecified: Secondary | ICD-10-CM

## 2020-08-10 LAB — POCT GLYCOSYLATED HEMOGLOBIN (HGB A1C): HbA1c, POC (controlled diabetic range): 10.7 % — AB (ref 0.0–7.0)

## 2020-08-10 MED ORDER — ACCU-CHEK GUIDE VI STRP: ORAL_STRIP | 2 refills | Status: DC

## 2020-08-10 MED ORDER — ACCU-CHEK GUIDE ME W/DEVICE KIT: 1.0000 | PACK | 0 refills | Status: DC

## 2020-08-10 MED ORDER — LANTUS SOLOSTAR 100 UNIT/ML ~~LOC~~ SOPN: 80.0000 [IU] | PEN_INJECTOR | Freq: Every day | SUBCUTANEOUS | 2 refills | Status: DC

## 2020-08-10 NOTE — Progress Notes (Signed)
08/10/2020, 4:53 PM  Endocrinology follow-up note   Subjective:    Patient ID: Devin [name], male    DOB: 01/06/1971.  Devin [name] is being seen in follow-up after he was seen in consultation for management of currently uncontrolled symptomatic diabetes requested by  Pipestone Nation, MD.    Past Medical History:  Diagnosis Date  . Asthma   . Chest pain   . CHF (congestive heart failure) ([place])   . Coronary artery disease    a. s/p DES x2 to LAD and DES to OM in 11/2017  . Depression   . Dyspnea   . GERD (gastroesophageal reflux disease)   . Headache   . History of kidney stones   . History of rhabdomyolysis   . Hyperlipidemia   . Hypertension   . Hypothyroidism   . Ischemic cardiomyopathy    a. 11/2017: echo showing EF of 35-40%, diffuse HK, and Grade 2 DD  . Kidney calculus 2014  . Pericardial effusion    Small, by dobutamine echocardiogram, 04/2006  . PONV (postoperative nausea and vomiting)   . Sleep apnea    does not wear cpap  . Stroke ([place])   . Tobacco abuse   . Type 2 diabetes mellitus without complications ([place]) 9/48/5462  . Vitamin D deficiency     Past Surgical History:  Procedure Laterality Date  . APPENDECTOMY    . 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 CHOLECYSTECTOMY N/A 01/27/2013   Procedure: LAPAROSCOPIC CHOLECYSTECTOMY;  Surgeon: Jamesetta So, MD;  Location: [facility];  Service: General;  Laterality: N/A;  . CORONARY STENT INTERVENTION N/A 11/15/2017   Procedure: CORONARY STENT INTERVENTION;  Surgeon: Leonie Man, MD;  Location: [facility];  Service: Cardiovascular;  Laterality: N/A;  . HERNIA REPAIR     As a child  . INSERTION OF MESH N/A 11/13/2012   Procedure: INSERTION OF MESH;  Surgeon: Jamesetta So, MD;  Location: [facility];  Service: General;  Laterality: N/A;  .  IR ANGIO INTRA EXTRACRAN SEL COM CAROTID INNOMINATE BILAT MOD SED  12/01/2017  . IR ANGIO INTRA EXTRACRAN SEL COM CAROTID INNOMINATE BILAT MOD SED  02/13/2018  . IR ANGIO VERTEBRAL SEL SUBCLAVIAN INNOMINATE UNI L MOD SED  02/13/2018  . IR ANGIO VERTEBRAL SEL VERTEBRAL BILAT MOD SED  12/01/2017  . IR ANGIO VERTEBRAL SEL VERTEBRAL UNI R MOD SED  02/13/2018  . IR INTRA CRAN STENT  12/03/2017  . IR PTA INTRACRANIAL  02/25/2018  . IR US GUIDE VASC ACCESS RIGHT  02/13/2018  . KIDNEY STONE SURGERY    . LEFT HEART CATH AND CORONARY ANGIOGRAPHY N/A 11/15/2017   Procedure: LEFT HEART CATH AND CORONARY ANGIOGRAPHY;  Surgeon: Leonie Man, MD;  Location: [facility];  Service: Cardiovascular;  Laterality: N/A;  . PERCUTANEOUS NEPHROLITHOTRIPSY    . RADIOLOGY WITH ANESTHESIA Left 12/03/2017   Procedure: Angioplasty with possible stenting of left VBJ;  Surgeon: Luanne Bras, MD;  Location: [facility];  Service: Radiology;  Laterality: Left;  . RADIOLOGY WITH ANESTHESIA N/A 02/25/2018  Procedure: STENT PLACEMENT;  Surgeon: Luanne Bras, MD;  Location: [facility];  Service: Radiology;  Laterality: N/A;  . UMBILICAL HERNIA REPAIR N/A 11/13/2012   Procedure: UMBILICAL HERNIORRHAPHY;  Surgeon: Jamesetta So, MD;  Location: [facility];  Service: General;  Laterality: N/A;  . VARICOCELECTOMY      Social History   Socioeconomic History  . Marital status: Divorced    Spouse name: Not on file  . Number of children: Not on file  . Years of education: Not on file  . Highest education level: Not on file  Occupational History  . Occupation: Disability     Comment:      Employer: [employer]  Tobacco Use  . Smoking status: Current Every Day Smoker    Packs/day: 1.00    Years: 26.00    Pack years: 26.00    Types: Cigarettes    Start date: 03/13/1985  . Smokeless tobacco: Former Systems developer    Types: Snuff, Sarina Ser    Quit date: 03/14/1987  Vaping Use  . Vaping Use: Former  . Devices: quit in 2011  Substance and  Sexual Activity  . Alcohol use: No  . Drug use: No  . Sexual activity: Not on file  Other Topics Concern  . Not on file  Social History Narrative   Eats fast food daily   No exercise outside of work   Lives in [place] with his dog   MAINTENANCE TECHNICIAN   Caffeine use: tea/soda daily   Left handed    Social Determinants of Health   Financial Resource Strain: Not on file  Food Insecurity: Not on file  Transportation Needs: Not on file  Physical Activity: Not on file  Stress: Not on file  Social Connections: Not on file    Family History  Problem Relation Age of Onset  . Coronary artery disease Father   . Hypertension Father   . Hyperlipidemia Father   . Diabetes Father   . Congestive Heart Failure Father 62  . Arrhythmia Father        had an ICD  . Diabetes Mother   . Hypertension Mother   . Hyperlipidemia Mother   . Obesity Mother 18       died after bariatric surgery, liver failure and infection  . Coronary artery disease Maternal Grandfather        both grandfathers and several uncles  . Coronary artery disease Other   . Diabetes Sister        both sisters  . Stroke Maternal Aunt   . Stroke Maternal Uncle     Outpatient Encounter Medications as of 08/10/2020  Medication Sig  . Blood Glucose Monitoring Suppl (ACCU-CHEK GUIDE ME) w/Device KIT 1 Piece by Does not apply route as directed.  . Cholecalciferol 1.25 MG (50000 UT) capsule Take 1 capsule by mouth once a week.  Marland Kitchen glucose blood (ACCU-CHEK GUIDE) test strip Test glucose 2 times a day  . aspirin 81 MG EC tablet Take 1 tablet (81 mg total) by mouth daily. With food  . Blood Glucose Monitoring Suppl w/Device KIT Dispense based on patient and insurance preference. Check blood sugar daily before breakfast (ICD9 250.0)  . buPROPion (WELLBUTRIN SR) 150 MG 12 hr tablet Take by mouth.  . clopidogrel (PLAVIX) 75 MG tablet Take 1 tablet (75 mg total) by mouth daily.  . DULERA 200-5 MCG/ACT AERO Inhale 2 puffs into the  lungs 2 (two) times daily.  Marland Kitchen escitalopram (LEXAPRO) 10 MG tablet Take 10 mg by  mouth daily.  . EUTHYROX 200 MCG tablet Take 200 mcg by mouth at bedtime.  Arna Medici 50 MCG tablet Take 50 mcg by mouth daily.  . Evolocumab (REPATHA SURECLICK) 254 MG/ML SOAJ Inject 1 pen into the skin every 14 (fourteen) days.  Marland Kitchen ezetimibe (ZETIA) 10 MG tablet Take 1 tablet (10 mg total) by mouth daily.  . fenofibrate (TRICOR) 145 MG tablet Take 1 tablet (145 mg total) by mouth daily.  Marland Kitchen gabapentin (NEURONTIN) 300 MG capsule One po q AM, one po q PM and 2 po qHS  . glipiZIDE (GLUCOTROL XL) 5 MG 24 hr tablet Take 1 tablet (5 mg total) by mouth daily with breakfast.  . insulin glargine (LANTUS SOLOSTAR) 100 UNIT/ML Solostar Pen Inject 80 Units into the skin at bedtime.  . Insulin Pen Needle (B-D ULTRAFINE III SHORT PEN) 31G X 8 MM MISC 1 each by Does not apply route as directed.  Marland Kitchen losartan (COZAAR) 25 MG tablet Take 0.5 tablets (12.5 mg total) by mouth daily.  . nitroGLYCERIN (NITROSTAT) 0.4 MG SL tablet Place 1 tablet (0.4 mg total) under the tongue every 5 (five) minutes as needed for chest pain.  . pantoprazole (PROTONIX) 40 MG tablet Take 1 tablet (40 mg total) by mouth daily.  . sitaGLIPtin (JANUVIA) 100 MG tablet Take 1 tablet (100 mg total) by mouth daily.  Marland Kitchen VASCEPA 1 g capsule Take 2 capsules (2 g total) by mouth 2 (two) times daily.  . [DISCONTINUED] EUTHYROX 100 MCG tablet Take 1 tablet (100 mcg total) by mouth daily.  . [DISCONTINUED] LANTUS SOLOSTAR 100 UNIT/ML Solostar Pen INJECT 60 UNITS SUBCUTANEOUSLY AT BEDTIME   No facility-administered encounter medications on file as of 08/10/2020.    ALLERGIES: Allergies  Allergen Reactions  . Crestor [Rosuvastatin] Other (See Comments)    rhabdomyolosis  . Trulicity [Dulaglutide] Nausea And Vomiting  . Bee Venom Swelling    SWELLING REACTION UNSPECIFIED     VACCINATION STATUS: Immunization History  Administered Date(s) Administered  . Tdap  12/12/2015    Diabetes He presents for his follow-up diabetic visit. He has type 2 diabetes mellitus. Onset time: He was diagnosed at approximate age of 18 years. His disease course has been worsening. There are no hypoglycemic associated symptoms. Pertinent negatives for hypoglycemia include no confusion, headaches, pallor or seizures. Associated symptoms include foot paresthesias, polydipsia and polyuria. Pertinent negatives for diabetes include no chest pain, no fatigue, no polyphagia and no weakness. There are no hypoglycemic complications. Symptoms are improving. Diabetic complications include a CVA, heart disease and PVD. Risk factors for coronary artery disease include diabetes mellitus, dyslipidemia, family history, obesity, male sex, tobacco exposure and sedentary lifestyle. Current diabetic treatments: He is currently on Lantus 15 units nightly, Januvia 100 mg daily, Amaryl 2 mg p.o. daily. His weight is fluctuating minimally. He is following a generally unhealthy diet. When asked about meal planning, he reported none. He has had a previous visit with a dietitian. He never participates in exercise. His home blood glucose trend is fluctuating minimally. His breakfast blood glucose range is generally 180-200 mg/dl. His bedtime blood glucose range is generally >200 mg/dl. His overall blood glucose range is >200 mg/dl. (He missed his appointment since November.  He presents with significantly above target glycemic profile, slightly better than his last presentation.  He is recent and point-of-care A1c is 10.7%, overall improving from 13.8%.  He did not document any hypoglycemia.    ) An ACE inhibitor/angiotensin II receptor blocker is being taken.  He does not see a podiatrist.Eye exam is current.  Hyperlipidemia This is a chronic problem. The current episode started more than 1 year ago. The problem is uncontrolled. Exacerbating diseases include diabetes, hypothyroidism and obesity. Associated symptoms  include myalgias. Pertinent negatives include no chest pain or shortness of breath. Current antihyperlipidemic treatment includes ezetimibe. Risk factors for coronary artery disease include dyslipidemia, diabetes mellitus, hypertension, male sex, family history, obesity and a sedentary lifestyle.  Hypertension This is a chronic problem. The current episode started more than 1 year ago. The problem is controlled. Pertinent negatives include no chest pain, headaches, neck pain, palpitations or shortness of breath. Risk factors for coronary artery disease include diabetes mellitus, dyslipidemia, family history, obesity, male gender, sedentary lifestyle and smoking/tobacco exposure. Past treatments include angiotensin blockers. Hypertensive end-organ damage includes CAD/MI, CVA, heart failure and PVD. Identifiable causes of hypertension include a thyroid problem.  Thyroid Problem Presents for initial visit. Onset time: He has taken levothyroxine for at least 2 years. Patient reports no constipation, diarrhea, fatigue or palpitations. The symptoms have been stable. Past treatments include levothyroxine. His past medical history is significant for diabetes, heart failure and hyperlipidemia. Risk factors include family history of goiter.     Review of Systems  Constitutional: Negative for chills, fatigue, fever and unexpected weight change.  HENT: Negative for dental problem, mouth sores and trouble swallowing.   Eyes: Negative for visual disturbance.  Respiratory: Negative for cough, choking, chest tightness, shortness of breath and wheezing.   Cardiovascular: Negative for chest pain, palpitations and leg swelling.  Gastrointestinal: Negative for abdominal distention, abdominal pain, constipation, diarrhea, nausea and vomiting.  Endocrine: Positive for polydipsia and polyuria. Negative for polyphagia.  Genitourinary: Negative for dysuria, flank pain, hematuria and urgency.  Musculoskeletal: Positive for  myalgias. Negative for back pain, gait problem and neck pain.  Skin: Negative for pallor, rash and wound.  Neurological: Negative for seizures, syncope, weakness, numbness and headaches.  Psychiatric/Behavioral: Negative for confusion and dysphoric mood.    Objective:    Vitals with BMI 08/10/2020 05/20/2020 01/30/2020  Height 6' 6"  6' 6"  6' 6"   Weight 277 lbs 13 oz 276 lbs 281 lbs  BMI 32.11 62.5 63.89  Systolic 373 428 768  Diastolic 76 80 78  Pulse 76 71 72    BP 120/76   Pulse 76   Ht 6' 6"  (1.981 m)   Wt 277 lb 12.8 oz (126 kg)   BMI 32.10 kg/m   Wt Readings from Last 3 Encounters:  08/10/20 277 lb 12.8 oz (126 kg)  05/20/20 276 lb (125.2 kg)  01/30/20 281 lb (127.5 kg)     Physical Exam Constitutional:      General: He is not in acute distress.    Appearance: He is well-developed.  HENT:     Head: Normocephalic and atraumatic.  Neck:     Thyroid: No thyromegaly.     Trachea: No tracheal deviation.  Cardiovascular:     Rate and Rhythm: Normal rate.     Pulses:          Dorsalis pedis pulses are 1+ on the right side and 1+ on the left side.       Posterior tibial pulses are 1+ on the right side and 1+ on the left side.     Heart sounds: Normal heart sounds, S1 normal and S2 normal. No murmur heard. No gallop.   Pulmonary:     Effort: Pulmonary effort is normal. No respiratory distress.  Breath sounds: Normal breath sounds. No wheezing.  Abdominal:     General: There is no distension.     Tenderness: There is no abdominal tenderness. There is no guarding.  Musculoskeletal:     Right shoulder: No swelling or deformity.     Cervical back: Normal range of motion and neck supple.  Skin:    General: Skin is warm and dry.     Findings: No rash.     Nails: There is no clubbing.  Neurological:     Mental Status: He is alert and oriented to person, place, and time.     Cranial Nerves: No cranial nerve deficit.     Sensory: No sensory deficit.     Gait: Gait  normal.     Deep Tendon Reflexes: Reflexes are normal and symmetric.  Psychiatric:        Speech: Speech normal.        Behavior: Behavior normal. Behavior is cooperative.        Thought Content: Thought content normal.        Judgment: Judgment normal.     CMP ( most recent) CMP     Component Value Date/Time   NA 131 (L) 10/15/2019 0046   K 3.7 10/15/2019 0046   CL 93 (L) 10/15/2019 0046   CO2 26 10/15/2019 0046   GLUCOSE 380 (H) 10/15/2019 0046   BUN 14 10/15/2019 0046   CREATININE 1.10 10/15/2019 0728   CALCIUM 10.1 10/15/2019 0046   PROT 7.0 12/23/2019 1512   ALBUMIN 4.4 12/23/2019 1512   AST 13 12/23/2019 1512   ALT 24 12/23/2019 1512   ALKPHOS 81 12/23/2019 1512   BILITOT <0.2 12/23/2019 1512   GFRNONAA >60 10/15/2019 0728   GFRAA >60 10/15/2019 0728     Diabetic Labs (most recent): Lab Results  Component Value Date   HGBA1C 10.7 (A) 08/10/2020   HGBA1C 10.3 02/18/2020   HGBA1C 10.2 (A) 01/30/2020     Lipid Panel ( most recent) Lipid Panel     Component Value Date/Time   CHOL 265 (A) 07/21/2020 0000   CHOL 235 (H) 12/23/2019 1512   TRIG 741 (A) 07/21/2020 0000   HDL 24 (A) 07/21/2020 0000   HDL 25 (L) 12/23/2019 1512   CHOLHDL 9.4 (H) 12/23/2019 1512   CHOLHDL NOT REPORTED DUE TO HIGH TRIGLYCERIDES 10/15/2019 0728   VLDL UNABLE TO CALCULATE IF TRIGLYCERIDE OVER 400 mg/dL 10/15/2019 0728   LDLCALC 109 07/21/2020 0000   LDLCALC 150 (H) 12/23/2019 1512   LDLDIRECT 143 (H) 12/23/2019 1511   LDLDIRECT UNABLE TO CALCULATE IF TRIGLYCERIDE IS >1293 mg/dL 10/15/2019 0728   LABVLDL 60 (H) 12/23/2019 1512      Lab Results  Component Value Date   TSH 12.90 (A) 07/21/2020   TSH 29.00 (A) 04/05/2020   TSH 49.10 (A) 02/18/2020   TSH 83.261 (H) 10/15/2019   TSH 60.657 (H) 01/05/2018   TSH 84.910 (H) 11/13/2017   TSH >100.000 (H) 10/06/2013   FREET4 <0.25 (L) 10/15/2019   FREET4 0.24 (L) 10/06/2013       Assessment & Plan:   1. DM type 2 causing  vascular disease ([place])  - Lisa Roca has currently uncontrolled symptomatic type 2 DM since  50 years of age.   He missed his appointment since November.  He presents with significantly above target glycemic profile, slightly better than his last presentation.  He is recent and point-of-care A1c is 10.7%, overall improving from 13.8%.  He did not  document any hypoglycemia.    - Recent labs reviewed. - I had a long discussion with him about the progressive nature of diabetes and the pathology behind its complications. -his diabetes is complicated by coronary artery disease, CVA, heart failure, peripheral artery disease, obesity/sedentary life, smoking and he remains at a high risk for more acute and chronic complications which include CAD, CVA, CKD, retinopathy, and neuropathy. These are all discussed in detail with him.  - I have counseled him on diet  and weight management  by adopting a carbohydrate restricted/protein rich diet. Patient is encouraged to switch to  unprocessed or minimally processed     complex starch and increased protein intake (animal or plant source), fruits, and vegetables. -  he is advised to stick to a routine mealtimes to eat 3 meals  a day and avoid unnecessary snacks ( to snack only to correct hypoglycemia).   - he acknowledges that there is a room for improvement in his food and drink choices. - Suggestion is made for him to avoid simple carbohydrates  from his diet including Cakes, Sweet Desserts, Ice Cream, Soda (diet and regular), Sweet Tea, Candies, Chips, Cookies, Store Bought Juices, Alcohol in Excess of  1-2 drinks a day, Artificial Sweeteners,  Coffee Creamer, and "Sugar-free" Products, Lemonade. This will help patient to have more stable blood glucose profile and potentially avoid unintended weight gain.   - he will be scheduled with Jearld Fenton, RDN, CDE for diabetes education.  - I have approached him with the following individualized plan to manage   his diabetes and patient agrees:   -In light of his chronic glycemic burden, he will continue to need insulin treatment in order for him to achieve control of diabetes to target.  He has some room on his basal insulin before considering prandial insulin.   -He is advised to increase his Lantus to 80 units nightly, associated with monitoring of blood glucose at least twice daily-daily before breakfast and at bedtime.  - he is warned not to take insulin without proper monitoring per orders.  - he is encouraged to call clinic for blood glucose levels less than 70 or above 300 mg /dl. - he is advised to continue Januvia 100 mg p.o. daily, therapeutically suitable for patient . -He is advised to continue glipizide 5 mg XL p.o. daily at breakfast will be restarted.   - Specific targets for  A1c;  LDL, HDL,  and Triglycerides were discussed with the patient.  2) Blood Pressure /Hypertension:   -His blood pressure is controlled to target.  he is advised to continue his current medications including losartan 25 mg p.o. daily with breakfast . 3) Lipids/Hyperlipidemia:   He has severely uncontrolled lipid panel, although her triglycerides are slowly improving to 741 from 2400+.  Her LDL of 109.     He does not tolerate statins.  Patient is on Zetia 10 mg p.o. daily as well as fenofibrate 145 mg p.o. daily at bedtime.   -Has responded to Repatha, currently 140 mg of therapy every 14 days.   -  I discussed with the patient the fact that diabetes management with insulin will also help with hypertriglyceridemia.   4)  Weight/Diet:  Body mass index is 32.1 kg/m.  -   clearly complicating his diabetes care.   he is  a candidate for weight loss. I discussed with him the fact that loss of 5 - 10% of his  current body weight will have the most impact  on his diabetes management.  Exercise, and detailed carbohydrates information provided  -  detailed on discharge instructions.  5) hypothyroidism She has  questionable compliance.  The circumstance of his diagnosis are not available to review.  He is currently on Euthyrox 250 mcg p.o. daily before breakfast.  He will not be considered for any higher dose of Euthyrox at this time, he is advised to work on consistency. His previsit thyroid function tests are slightly better than before.   - We discussed about the correct intake of his thyroid hormone, on empty stomach at fasting, with water, separated by at least 30 minutes from breakfast and other medications,  and separated by more than 4 hours from calcium, iron, multivitamins, acid reflux medications (PPIs). -Patient is made aware of the fact that thyroid hormone replacement is needed for life, dose to be adjusted by periodic monitoring of thyroid function tests.  6) vitamin D deficiency He remains profoundly vitamin D deficient.  He is advised to continue ergocalciferol 50, 000 units weekly.   7) Chronic Care/Health Maintenance:  -he  is on ARB and did not tolerate statin medications and  is encouraged to initiate and continue to follow up with Ophthalmology, Dentist,  Podiatrist at least yearly or according to recommendations, and advised to  quit smoking. I have recommended yearly flu vaccine and pneumonia vaccine at least every 5 years; moderate intensity exercise for up to 150 minutes weekly; and  sleep for at least 7 hours a day.   POCT ABI Results on December 11, 2019.  His ABI is abnormal. Right ABI: 1.43      left ABI: 1.32.  Right leg systolic / diastolic: 426/834 mmHg Left leg systolic / diastolic: 196/22 mmHg  Arm systolic / diastolic: 297/98 mmHG Evaluated by vascular surgery, no significant occlusive peripheral arterial disease was detected.  - he is  advised to maintain close follow up with Coronado Nation, MD for primary care needs, as well as his other providers for optimal and coordinated care.   I spent 45 minutes in the care of the patient today including review of  labs from [facility], Lipids, Thyroid Function, Hematology (current and previous including abstractions from other facilities); face-to-face time discussing  his blood glucose readings/logs, discussing hypoglycemia and hyperglycemia episodes and symptoms, medications doses, his options of short and long term treatment based on the latest standards of care / guidelines;  discussion about incorporating lifestyle medicine;  and documenting the encounter.    Please refer to Patient Instructions for Blood Glucose Monitoring and Insulin/Medications Dosing Guide"  in media tab for additional information. Please  also refer to " Patient Self Inventory" in the Media  tab for reviewed elements of pertinent patient history.  Lisa Roca participated in the discussions, expressed understanding, and voiced agreement with the above plans.  All questions were answered to his satisfaction. he is encouraged to contact clinic should he have any questions or concerns prior to his return visit.\  Follow up plan: - Return in about 3 months (around 11/10/2020) for Bring Meter and Logs- A1c in Office.  Glade Lloyd, MD [facility] [address] [place], [place] [address] Phone: [phone]  Fax: [phone]    08/10/2020, 4:53 PM  This note was partially dictated with voice recognition software. Similar sounding words can be transcribed inadequately or may not  be corrected upon review.

## 2020-08-10 NOTE — Patient Instructions (Signed)
                                     Advice for Weight Management  -For most of us the best way to lose weight is by diet management. Generally speaking, diet management means consuming less calories intentionally which over time brings about progressive weight loss.  This can be achieved more effectively by restricting carbohydrate consumption to the minimum possible.  So, it is critically important to know your numbers: how much calorie you are consuming and how much calorie you need. More importantly, our carbohydrates sources should be unprocessed or minimally processed complex starch food items.   Sometimes, it is important to balance nutrition by increasing protein intake (animal or plant source), fruits, and vegetables.  -Sticking to a routine mealtime to eat 3 meals a day and avoiding unnecessary snacks is shown to have a big role in weight control. Under normal circumstances, the only time we lose real weight is when we are hungry, so allow hunger to take place- hunger means no food between meal times, only water.  It is not advisable to starve.   -It is better to avoid simple carbohydrates including: Cakes, Sweet Desserts, Ice Cream, Soda (diet and regular), Sweet Tea, Candies, Chips, Cookies, Store Bought Juices, Alcohol in Excess of  1-2 drinks a day, Lemonade,  Artificial Sweeteners, Doughnuts, Coffee Creamers, "Sugar-free" Products, etc, etc.  This is not a complete list.....    -Consulting with certified diabetes educators is proven to provide you with the most accurate and current information on diet.  Also, you may be  interested in discussing diet options/exchanges , we can schedule a visit with Devin [name], RDN, CDE for individualized nutrition education.  -Exercise: If you are able: 30 -60 minutes a day ,4 days a week, or 150 minutes a week.  The longer the better.  Combine stretch, strength, and aerobic activities.  If you were told in the  past that you have high risk for cardiovascular diseases, you may seek evaluation by your heart doctor prior to initiating moderate to intense exercise programs.                                  Additional Care Considerations for Diabetes   -Diabetes  is a chronic disease.  The most important care consideration is regular follow-up with your diabetes care provider with the goal being avoiding or delaying its complications and to take advantage of advances in medications and technology.    -Type 2 diabetes is known to coexist with other important comorbidities such as high blood pressure and high cholesterol.  It is critical to control not only the diabetes but also the high blood pressure and high cholesterol to minimize and delay the risk of complications including coronary artery disease, stroke, amputations, blindness, etc.    - Studies showed that people with diabetes will benefit from a class of medications known as ACE inhibitors and statins.  Unless there are specific reasons not to be on these medications, the standard of care is to consider getting one from these groups of medications at an optimal doses.  These medications are generally considered safe and proven to help protect the heart and the kidneys.    - People with diabetes are encouraged to initiate and maintain regular follow-up with eye doctors, foot   doctors, dentists , and if necessary heart and kidney doctors.     - It is highly recommended that people with diabetes quit smoking or stay away from smoking, and get yearly  flu vaccine and pneumonia vaccine at least every 5 years.  One other important lifestyle recommendation is to ensure adequate sleep - at least 6-7 hours of uninterrupted sleep at night.  -Exercise: If you are able: 30 -60 minutes a day, 4 days a week, or 150 minutes a week.  The longer the better.  Combine stretch, strength, and aerobic activities.  If you were told in the past that you have high risk for  cardiovascular diseases, you may seek evaluation by your heart doctor prior to initiating moderate to intense exercise programs.          

## 2020-09-01 ENCOUNTER — Telehealth: Payer: Self-pay | Admitting: Cardiology

## 2020-09-01 NOTE — Telephone Encounter (Signed)
Please call Devin [name] with [employer] she received a fax for approval for Repatha for this patient , they were not aware the patient was taking Repatha and she needs to know exactly what kind of approval authorization we are requesting.

## 2020-09-01 NOTE — Telephone Encounter (Signed)
Will defer to nurse working on this and will be addressed upon her return next week.

## 2020-09-03 NOTE — Telephone Encounter (Signed)
Prior Authorization sent to [facility] for approval, was denied first time and sent back for appeal. Will take approx. 20 days for decision. Will update patient then when there is an answer.

## 2020-09-05 DIAGNOSIS — R059 Cough, unspecified: Secondary | ICD-10-CM | POA: Diagnosis not present

## 2020-09-05 DIAGNOSIS — I639 Cerebral infarction, unspecified: Secondary | ICD-10-CM | POA: Diagnosis not present

## 2020-09-05 DIAGNOSIS — Z95828 Presence of other vascular implants and grafts: Secondary | ICD-10-CM | POA: Diagnosis not present

## 2020-09-05 DIAGNOSIS — J322 Chronic ethmoidal sinusitis: Secondary | ICD-10-CM | POA: Diagnosis not present

## 2020-09-06 ENCOUNTER — Telehealth: Payer: Self-pay | Admitting: *Deleted

## 2020-09-06 DIAGNOSIS — E039 Hypothyroidism, unspecified: Secondary | ICD-10-CM | POA: Diagnosis not present

## 2020-09-06 DIAGNOSIS — N179 Acute kidney failure, unspecified: Secondary | ICD-10-CM | POA: Diagnosis not present

## 2020-09-06 DIAGNOSIS — I2584 Coronary atherosclerosis due to calcified coronary lesion: Secondary | ICD-10-CM | POA: Diagnosis not present

## 2020-09-06 DIAGNOSIS — E1122 Type 2 diabetes mellitus with diabetic chronic kidney disease: Secondary | ICD-10-CM | POA: Diagnosis not present

## 2020-09-06 DIAGNOSIS — X58XXXA Exposure to other specified factors, initial encounter: Secondary | ICD-10-CM | POA: Diagnosis not present

## 2020-09-06 DIAGNOSIS — T5801XA Toxic effect of carbon monoxide from motor vehicle exhaust, accidental (unintentional), initial encounter: Secondary | ICD-10-CM | POA: Diagnosis not present

## 2020-09-06 DIAGNOSIS — I2510 Atherosclerotic heart disease of native coronary artery without angina pectoris: Secondary | ICD-10-CM | POA: Insufficient documentation

## 2020-09-06 DIAGNOSIS — T5891XA Toxic effect of carbon monoxide from unspecified source, accidental (unintentional), initial encounter: Secondary | ICD-10-CM | POA: Insufficient documentation

## 2020-09-06 DIAGNOSIS — E1142 Type 2 diabetes mellitus with diabetic polyneuropathy: Secondary | ICD-10-CM | POA: Diagnosis not present

## 2020-09-06 DIAGNOSIS — J9691 Respiratory failure, unspecified with hypoxia: Secondary | ICD-10-CM | POA: Diagnosis not present

## 2020-09-06 DIAGNOSIS — F17200 Nicotine dependence, unspecified, uncomplicated: Secondary | ICD-10-CM | POA: Insufficient documentation

## 2020-09-06 DIAGNOSIS — R778 Other specified abnormalities of plasma proteins: Secondary | ICD-10-CM | POA: Diagnosis not present

## 2020-09-06 DIAGNOSIS — Z4682 Encounter for fitting and adjustment of non-vascular catheter: Secondary | ICD-10-CM | POA: Diagnosis not present

## 2020-09-06 DIAGNOSIS — Z9911 Dependence on respirator [ventilator] status: Secondary | ICD-10-CM | POA: Diagnosis not present

## 2020-09-06 DIAGNOSIS — E872 Acidosis: Secondary | ICD-10-CM | POA: Diagnosis not present

## 2020-09-06 DIAGNOSIS — I5042 Chronic combined systolic (congestive) and diastolic (congestive) heart failure: Secondary | ICD-10-CM | POA: Diagnosis not present

## 2020-09-06 DIAGNOSIS — I130 Hypertensive heart and chronic kidney disease with heart failure and stage 1 through stage 4 chronic kidney disease, or unspecified chronic kidney disease: Secondary | ICD-10-CM | POA: Diagnosis not present

## 2020-09-06 DIAGNOSIS — Z7729 Contact with and (suspected ) exposure to other hazardous substances: Secondary | ICD-10-CM | POA: Diagnosis not present

## 2020-09-06 DIAGNOSIS — R4182 Altered mental status, unspecified: Secondary | ICD-10-CM | POA: Diagnosis not present

## 2020-09-06 DIAGNOSIS — D72829 Elevated white blood cell count, unspecified: Secondary | ICD-10-CM | POA: Diagnosis not present

## 2020-09-06 LAB — HEMOGLOBIN A1C: Hemoglobin A1C: 10.5

## 2020-09-06 LAB — BASIC METABOLIC PANEL WITH GFR
BUN: 12 (ref 4–21)
Creatinine: 1.2 (ref 0.6–1.3)

## 2020-09-06 NOTE — Telephone Encounter (Signed)
Fax received from [employer] ([employer]) - Repatha has been approved.  Coverage starts 06/08/2020 and will end 09/06/2021.

## 2020-09-07 DIAGNOSIS — Z7729 Contact with and (suspected ) exposure to other hazardous substances: Secondary | ICD-10-CM | POA: Diagnosis not present

## 2020-09-07 DIAGNOSIS — R4182 Altered mental status, unspecified: Secondary | ICD-10-CM | POA: Diagnosis not present

## 2020-09-07 DIAGNOSIS — X58XXXA Exposure to other specified factors, initial encounter: Secondary | ICD-10-CM | POA: Diagnosis not present

## 2020-09-07 DIAGNOSIS — T5891XA Toxic effect of carbon monoxide from unspecified source, accidental (unintentional), initial encounter: Secondary | ICD-10-CM | POA: Diagnosis not present

## 2020-09-07 DIAGNOSIS — I2510 Atherosclerotic heart disease of native coronary artery without angina pectoris: Secondary | ICD-10-CM | POA: Diagnosis not present

## 2020-09-07 DIAGNOSIS — R778 Other specified abnormalities of plasma proteins: Secondary | ICD-10-CM | POA: Diagnosis not present

## 2020-09-07 DIAGNOSIS — I2584 Coronary atherosclerosis due to calcified coronary lesion: Secondary | ICD-10-CM | POA: Diagnosis not present

## 2020-10-30 IMAGING — MR MR HEAD W/O CM
11 of 12 series · 41 of 48 positions shown · non-contrast
Comparison: 10/15/2019 CTA head and neck. 07/28/2018 MRI head and
prior.

CLINICAL DATA: Numbness, tingling

Cervical radiculopathy.
EXAM:
MRI HEAD WITHOUT CONTRAST
MRI CERVICAL SPINE WITHOUT CONTRAST
TECHNIQUE: Multiplanar, multiecho pulse sequences of the brain and surrounding
structures, and cervical spine, to include the craniocervical
junction and cervicothoracic junction, were obtained without
intravenous contrast.

[Series 5: DWI · axial · 4.0mm · 0.88mm/px · z∈[-49,+88]mm · 5 of 36 slices shown (1 of 6)]
[im 1/36]
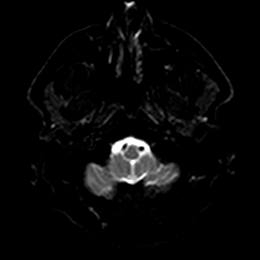
[im 9/36]
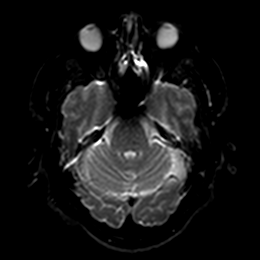
[im 18/36]
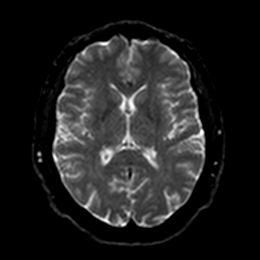
[im 27/36]
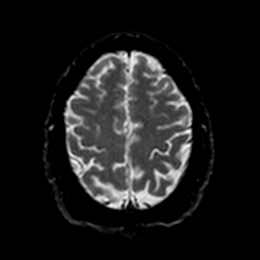
[im 36/36]
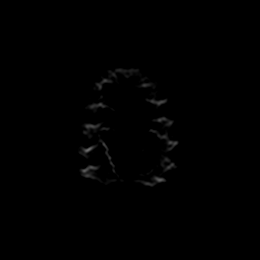

[Series 5: DWI · axial · 4.0mm · 0.88mm/px · z∈[-49,+88]mm · 5 of 36 slices shown (2 of 6)]
[im 1/36]
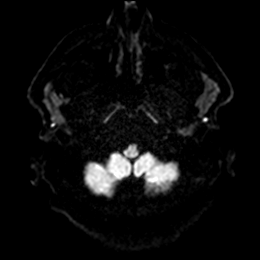
[im 9/36]
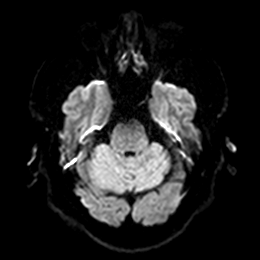
[im 18/36]
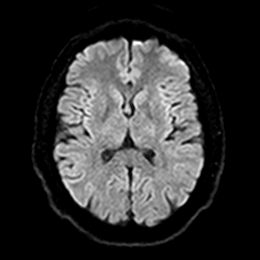
[im 27/36]
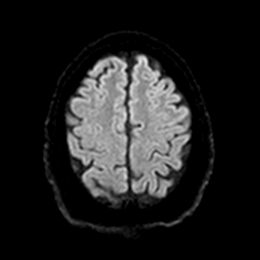
[im 36/36]
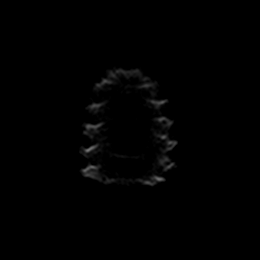

[Series 6: DWI · axial · 4.0mm · 0.88mm/px · z∈[-49,+88]mm · 4 of 36 slices shown (3 of 6)]
[im 1/36]
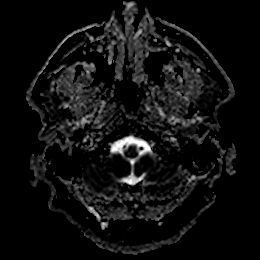
[im 12/36]
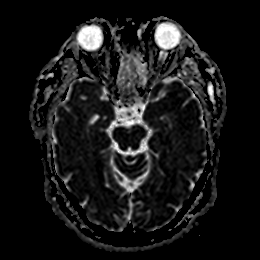
[im 24/36]
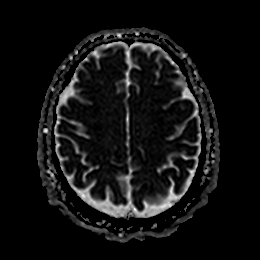
[im 36/36]
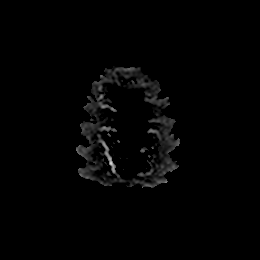

[Series 7: DWI · coronal · 4.0mm · 0.88mm/px · 4 of 32 slices shown (4 of 6)]
[im 1/32]
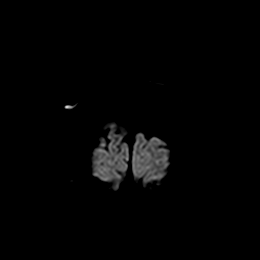
[im 11/32]
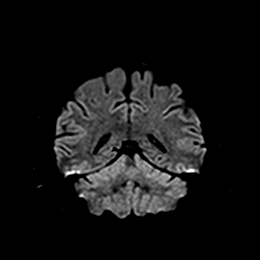
[im 21/32]
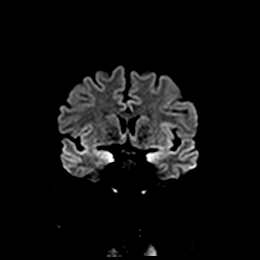
[im 32/32]
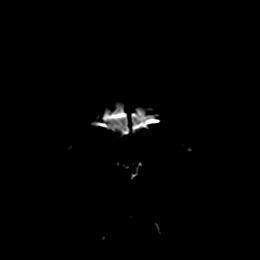

[Series 7: DWI · coronal · 4.0mm · 0.88mm/px · 4 of 32 slices shown (5 of 6)]
[im 1/32]
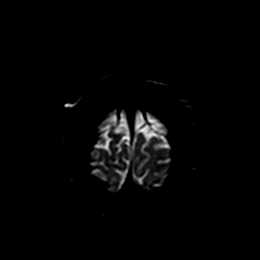
[im 11/32]
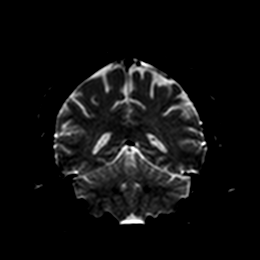
[im 21/32]
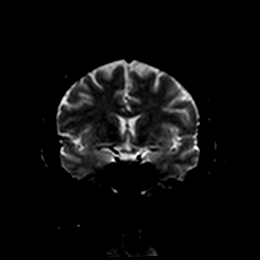
[im 32/32]
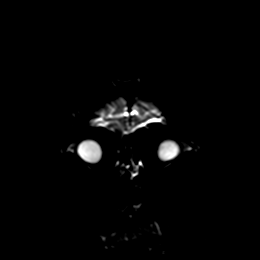

[Series 8: DWI · coronal · 4.0mm · 0.88mm/px · 4 of 30 slices shown (6 of 6)]
[im 1/30]
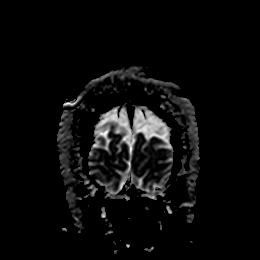
[im 10/30]
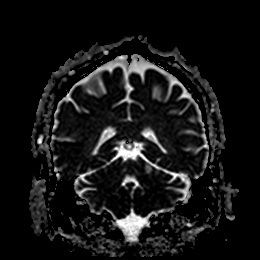
[im 20/30]
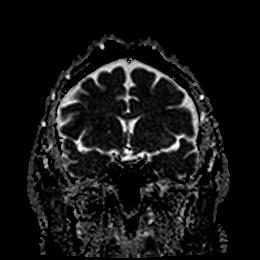
[im 30/30]
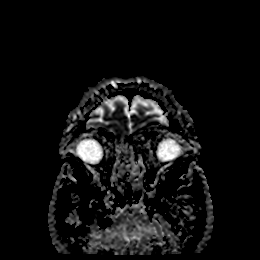

[Series 9: T1 · sagittal · 5.0mm · 0.94mm/px · 3 of 25 slices shown]
[im 1/25]
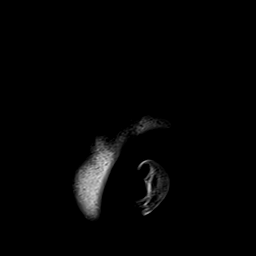
[im 13/25]
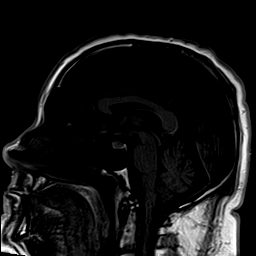
[im 25/25]
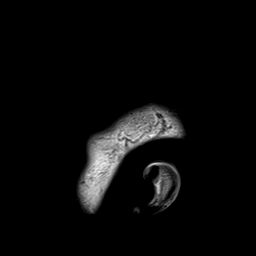

[Series 10: T2 · axial · 5.0mm · 0.72mm/px · z∈[-46,+85]mm · 2 of 20 slices shown (1 of 2)]
[im 1/20]
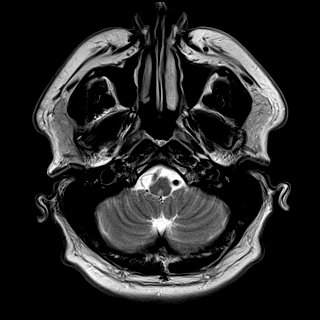
[im 20/20]
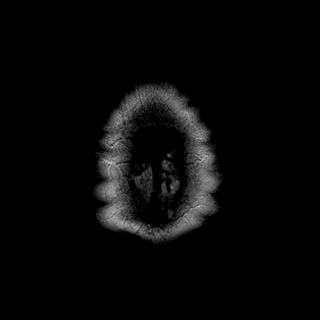

[Series 11: ax hemo · axial · 5.0mm · 0.86mm/px · z∈[-53,+89]mm · 3 of 25 slices shown]
[im 1/25]
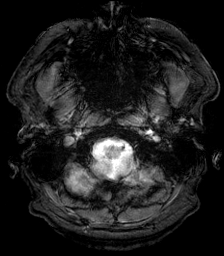
[im 13/25]
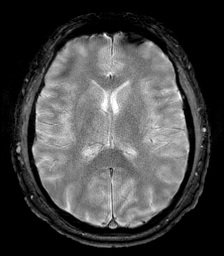
[im 25/25]
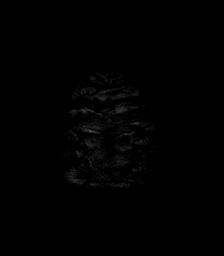

[Series 12: FLAIR · axial · 4.0mm · 0.43mm/px · z∈[-38,+97]mm · 4 of 36 slices shown]
[im 1/36]
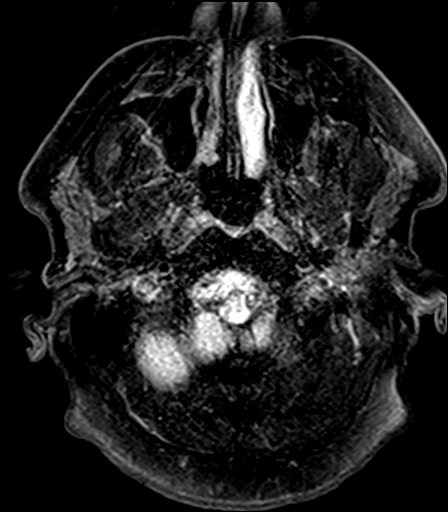
[im 12/36]
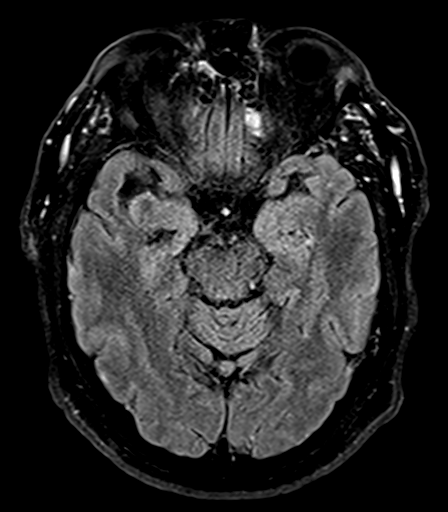
[im 24/36]
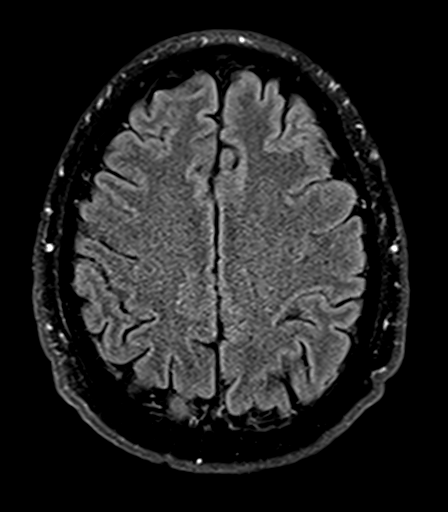
[im 36/36]
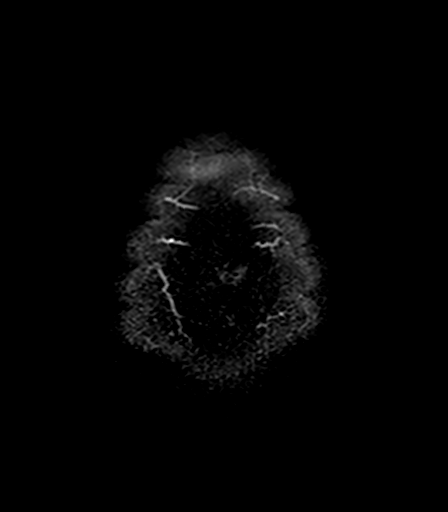

[Series 14: T2 · coronal · 5.0mm · 0.72mm/px · 3 of 28 slices shown (2 of 2)]
[im 1/28]
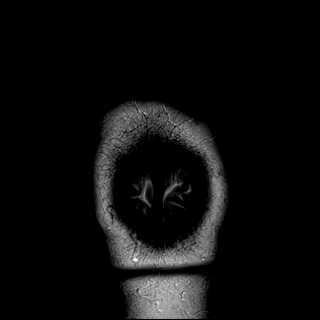
[im 14/28]
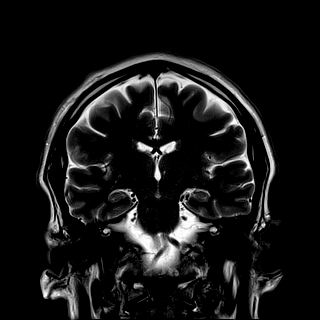
[im 28/28]
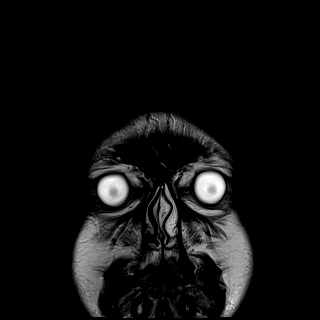

[41 of 48 positions shown; findings below may reference images not displayed]

FINDINGS: Image quality is degraded by motion artifact.

MRI HEAD FINDINGS

Brain: Sequela of remote left cerebellar and right pontine insults.
No acute infarct or intracranial hemorrhage. No midline shift,
ventriculomegaly or extra-axial fluid collection. No mass lesion.
Few scattered FLAIR hyperintensities involving the periventricular
and subcortical white matter nonspecific however commonly associated
with chronic microvascular ischemic changes.

Vascular: Distal right V4 segment occlusion and left V4 segment
stent are better demonstrated on recent CTA. Remaining major
intracranial flow voids are preserved.

Skull and upper cervical spine: Normal marrow signal.

Sinuses/Orbits: Normal orbits. Mild ethmoid sinus mucosal
thickening. No mastoid effusion.

Other: None.

MRI CERVICAL SPINE FINDINGS

Alignment: Normal.

Vertebrae: Normal bone marrow signal intensity. No focal osseous
lesion.

Cord: Normal signal and morphology.

Posterior Fossa, vertebral arteries: Please see concurrent MRI head.

Disc levels: Multilevel desiccation however disc spaces are grossly
preserved.

C2-3: No significant disc bulge, spinal canal or neural foraminal
narrowing.

C3-4: Small central protrusion with uncovertebral and bilateral
facet degenerative spurring. Patent spinal canal and bilateral
neural foramen.

C4-5: Disc osteophyte complex with superimposed central protrusion
and left predominant uncovertebral/facet hypertrophy. Mild spinal
canal and bilateral neural foraminal narrowing.

C5-6: Disc osteophyte complex with uncovertebral and facet
degenerative spurring. Mild spinal canal and bilateral neural
foraminal narrowing.

C6-7: Disc osteophyte complex with superimposed left
subarticular/foraminal protrusion, uncovertebral and facet
degenerative spurring. Mild spinal canal, mild right and moderate
left neural foraminal narrowing.

C7-T1: No significant disc bulge, spinal canal or neural foraminal
narrowing.

Paraspinal tissues: Negative.
IMPRESSION: MRI head:

No acute intracranial process. Remote left cerebellar and right
pontine insults.

Minimal chronic microvascular ischemic changes. Mild ethmoid sinus
disease.

MRI cervical spine:

Multilevel spondylosis with mild spinal canal narrowing at the C4-7
levels.

Moderate left C6-7 neural foraminal narrowing. Mild bilateral C4-6
and right C6-7 neural foraminal narrowing.

## 2020-11-10 ENCOUNTER — Ambulatory Visit: Payer: Medicaid Other | Admitting: "Endocrinology

## 2020-11-10 ENCOUNTER — Telehealth: Payer: Self-pay

## 2020-11-10 NOTE — Telephone Encounter (Signed)
Repatha SureClick 140 mg/mL auto-injectors PA approved from 06/08/2020 to 09/06/2021

## 2020-11-11 ENCOUNTER — Ambulatory Visit (INDEPENDENT_AMBULATORY_CARE_PROVIDER_SITE_OTHER): Payer: Medicare Other | Admitting: "Endocrinology

## 2020-11-11 ENCOUNTER — Encounter: Payer: Self-pay | Admitting: "Endocrinology

## 2020-11-11 VITALS — BP 133/88 | HR 73 | Ht 78.0 in | Wt 289.6 lb

## 2020-11-11 DIAGNOSIS — E782 Mixed hyperlipidemia: Secondary | ICD-10-CM

## 2020-11-11 DIAGNOSIS — E039 Hypothyroidism, unspecified: Secondary | ICD-10-CM

## 2020-11-11 DIAGNOSIS — E1159 Type 2 diabetes mellitus with other circulatory complications: Secondary | ICD-10-CM | POA: Diagnosis not present

## 2020-11-11 DIAGNOSIS — I10 Essential (primary) hypertension: Secondary | ICD-10-CM

## 2020-11-11 DIAGNOSIS — E559 Vitamin D deficiency, unspecified: Secondary | ICD-10-CM | POA: Diagnosis not present

## 2020-11-11 MED ORDER — FREESTYLE LIBRE 2 READER DEVI: 0 refills | Status: DC

## 2020-11-11 MED ORDER — INSULIN LISPRO PROT & LISPRO (75-25 MIX) 100 UNIT/ML KWIKPEN: 60.0000 [IU] | PEN_INJECTOR | Freq: Two times a day (BID) | SUBCUTANEOUS | 2 refills | Status: DC

## 2020-11-11 MED ORDER — FREESTYLE LIBRE 2 SENSOR MISC: 1.0000 | 3 refills | Status: DC

## 2020-11-11 NOTE — Patient Instructions (Signed)

## 2020-11-11 NOTE — Progress Notes (Signed)
11/11/2020, 1:49 PM  Endocrinology follow-up note   Subjective:    Patient ID: Devin [name], male    DOB: 11-21-1970.  Devin [name] is being seen in follow-up after he was seen in consultation for management of currently uncontrolled symptomatic diabetes requested by  [place]ton Nation, MD.    Past Medical History:  Diagnosis Date   Asthma    Chest pain    CHF (congestive heart failure) ([place])    Coronary artery disease    a. s/p DES x2 to LAD and DES to OM in 11/2017   Depression    Dyspnea    GERD (gastroesophageal reflux disease)    Headache    History of kidney stones    History of rhabdomyolysis    Hyperlipidemia    Hypertension    Hypothyroidism    Ischemic cardiomyopathy    a. 11/2017: echo showing EF of 35-40%, diffuse HK, and Grade 2 DD   Kidney calculus 2014   Pericardial effusion    Small, by dobutamine echocardiogram, 04/2006   PONV (postoperative nausea and vomiting)    Sleep apnea    does not wear cpap   Stroke ([place])    Tobacco abuse    Type 2 diabetes mellitus without complications ([place]) 06/01/252   Vitamin D deficiency     Past Surgical History:  Procedure Laterality Date   APPENDECTOMY     CARDIAC CATHETERIZATION  09/2012   "Nonobstructive CAD with 30% proximal, 40% mid LAD disease; 30% proximal CFX; EF 55-65%"   CHOLECYSTECTOMY N/A 01/27/2013   Procedure: LAPAROSCOPIC CHOLECYSTECTOMY;  Surgeon: Jamesetta So, MD;  Location: [facility];  Service: General;  Laterality: N/A;   CORONARY STENT INTERVENTION N/A 11/15/2017   Procedure: CORONARY STENT INTERVENTION;  Surgeon: Leonie Man, MD;  Location: [facility];  Service: Cardiovascular;  Laterality: N/A;   HERNIA REPAIR     As a child   INSERTION OF MESH N/A 11/13/2012   Procedure: INSERTION OF MESH;  Surgeon: Jamesetta So, MD;  Location: [facility];  Service: General;  Laterality: N/A;   IR ANGIO INTRA EXTRACRAN SEL  COM CAROTID INNOMINATE BILAT MOD SED  12/01/2017   IR ANGIO INTRA EXTRACRAN SEL COM CAROTID INNOMINATE BILAT MOD SED  02/13/2018   IR ANGIO VERTEBRAL SEL SUBCLAVIAN INNOMINATE UNI L MOD SED  02/13/2018   IR ANGIO VERTEBRAL SEL VERTEBRAL BILAT MOD SED  12/01/2017   IR ANGIO VERTEBRAL SEL VERTEBRAL UNI R MOD SED  02/13/2018   IR INTRA CRAN STENT  12/03/2017   IR PTA INTRACRANIAL  02/25/2018   IR US GUIDE VASC ACCESS RIGHT  02/13/2018   KIDNEY STONE SURGERY     LEFT HEART CATH AND CORONARY ANGIOGRAPHY N/A 11/15/2017   Procedure: LEFT HEART CATH AND CORONARY ANGIOGRAPHY;  Surgeon: Leonie Man, MD;  Location: [facility];  Service: Cardiovascular;  Laterality: N/A;   PERCUTANEOUS NEPHROLITHOTRIPSY     RADIOLOGY WITH ANESTHESIA Left 12/03/2017   Procedure: Angioplasty with possible stenting of left VBJ;  Surgeon: Luanne Bras, MD;  Location: [facility];  Service: Radiology;  Laterality: Left;   RADIOLOGY WITH ANESTHESIA N/A 02/25/2018  Procedure: STENT PLACEMENT;  Surgeon: Luanne Bras, MD;  Location: [facility];  Service: Radiology;  Laterality: N/A;   UMBILICAL HERNIA REPAIR N/A 11/13/2012   Procedure: UMBILICAL HERNIORRHAPHY;  Surgeon: Jamesetta So, MD;  Location: [facility];  Service: General;  Laterality: N/A;   VARICOCELECTOMY      Social History   Socioeconomic History   Marital status: Divorced    Spouse name: Not on file   Number of children: Not on file   Years of education: Not on file   Highest education level: Not on file  Occupational History   Occupation: Disability     Comment:      Employer: [employer]  Tobacco Use   Smoking status: Every Day    Packs/day: 1.00    Years: 26.00    Pack years: 26.00    Types: Cigarettes    Start date: 03/13/1985   Smokeless tobacco: Former    Types: Snuff, Chew    Quit date: 03/14/1987  Vaping Use   Vaping Use: Former   Devices: quit in 2011  Substance and Sexual Activity   Alcohol use: No   Drug use: No   Sexual activity: Not  on file  Other Topics Concern   Not on file  Social History Narrative   Eats fast food daily   No exercise outside of work   Lives in [place] with his dog   MAINTENANCE TECHNICIAN   Caffeine use: tea/soda daily   Left handed    Social Determinants of Health   Financial Resource Strain: Not on file  Food Insecurity: Not on file  Transportation Needs: Not on file  Physical Activity: Not on file  Stress: Not on file  Social Connections: Not on file    Family History  Problem Relation Age of Onset   Coronary artery disease Father    Hypertension Father    Hyperlipidemia Father    Diabetes Father    Congestive Heart Failure Father 43   Arrhythmia Father        had an ICD   Diabetes Mother    Hypertension Mother    Hyperlipidemia Mother    Obesity Mother 25       died after bariatric surgery, liver failure and infection   Coronary artery disease Maternal Grandfather        both grandfathers and several uncles   Coronary artery disease Other    Diabetes Sister        both sisters   Stroke Maternal Aunt    Stroke Maternal Uncle     Outpatient Encounter Medications as of 11/11/2020  Medication Sig   Continuous Blood Gluc Receiver (FREESTYLE LIBRE 2 READER) DEVI As directed   Continuous Blood Gluc Sensor (FREESTYLE LIBRE 2 SENSOR) MISC 1 Piece by Does not apply route every 14 (fourteen) days.   Insulin Lispro Prot & Lispro (HUMALOG MIX 75/25 KWIKPEN) (75-25) 100 UNIT/ML Kwikpen Inject 60 Units into the skin 2 (two) times daily before a meal.   aspirin 81 MG EC tablet Take 1 tablet (81 mg total) by mouth daily. With food   Blood Glucose Monitoring Suppl (ACCU-CHEK GUIDE ME) w/Device KIT 1 Piece by Does not apply route as directed.   Blood Glucose Monitoring Suppl w/Device KIT Dispense based on patient and insurance preference. Check blood sugar daily before breakfast (ICD9 250.0)   buPROPion (WELLBUTRIN SR) 150 MG 12 hr tablet Take by mouth.   Cholecalciferol 1.25 MG (50000 UT)  capsule Take 1 capsule by mouth  once a week.   clopidogrel (PLAVIX) 75 MG tablet Take 1 tablet (75 mg total) by mouth daily.   DULERA 200-5 MCG/ACT AERO Inhale 2 puffs into the lungs 2 (two) times daily.   escitalopram (LEXAPRO) 10 MG tablet Take 10 mg by mouth daily. (Patient not taking: Reported on 11/11/2020)   EUTHYROX 200 MCG tablet Take 200 mcg by mouth at bedtime.   EUTHYROX 50 MCG tablet Take 50 mcg by mouth daily.   Evolocumab (REPATHA SURECLICK) 876 MG/ML SOAJ Inject 1 pen into the skin every 14 (fourteen) days.   ezetimibe (ZETIA) 10 MG tablet Take 1 tablet (10 mg total) by mouth daily.   fenofibrate (TRICOR) 145 MG tablet Take 1 tablet (145 mg total) by mouth daily.   gabapentin (NEURONTIN) 300 MG capsule One po q AM, one po q PM and 2 po qHS   glipiZIDE (GLUCOTROL XL) 5 MG 24 hr tablet Take 1 tablet (5 mg total) by mouth daily with breakfast.   glucose blood (ACCU-CHEK GUIDE) test strip Test glucose 2 times a day   Insulin Pen Needle (B-D ULTRAFINE III SHORT PEN) 31G X 8 MM MISC 1 each by Does not apply route as directed.   losartan (COZAAR) 25 MG tablet Take 0.5 tablets (12.5 mg total) by mouth daily.   nitroGLYCERIN (NITROSTAT) 0.4 MG SL tablet Place 1 tablet (0.4 mg total) under the tongue every 5 (five) minutes as needed for chest pain.   pantoprazole (PROTONIX) 40 MG tablet Take 1 tablet (40 mg total) by mouth daily.   sitaGLIPtin (JANUVIA) 100 MG tablet Take 1 tablet (100 mg total) by mouth daily.   VASCEPA 1 g capsule Take 2 capsules (2 g total) by mouth 2 (two) times daily.   [DISCONTINUED] insulin glargine (LANTUS SOLOSTAR) 100 UNIT/ML Solostar Pen Inject 80 Units into the skin at bedtime. (Patient taking differently: Inject 80 Units into the skin 2 (two) times daily.)   No facility-administered encounter medications on file as of 11/11/2020.    ALLERGIES: Allergies  Allergen Reactions   Crestor [Rosuvastatin] Other (See Comments)    rhabdomyolosis   Trulicity  [Dulaglutide] Nausea And Vomiting   Bee Venom Swelling    SWELLING REACTION UNSPECIFIED     VACCINATION STATUS: Immunization History  Administered Date(s) Administered   Tdap 12/12/2015    Diabetes He presents for his follow-up diabetic visit. He has type 2 diabetes mellitus. Onset time: He was diagnosed at approximate age of 89 years. His disease course has been worsening. There are no hypoglycemic associated symptoms. Pertinent negatives for hypoglycemia include no confusion, headaches, pallor or seizures. Associated symptoms include foot paresthesias, polydipsia and polyuria. Pertinent negatives for diabetes include no chest pain, no fatigue, no polyphagia and no weakness. There are no hypoglycemic complications. Symptoms are improving. Diabetic complications include a CVA, heart disease and PVD. Risk factors for coronary artery disease include diabetes mellitus, dyslipidemia, family history, obesity, male sex, tobacco exposure and sedentary lifestyle. Current diabetic treatments: He is currently on Lantus 15 units nightly, Januvia 100 mg daily, Amaryl 2 mg p.o. daily. His weight is increasing steadily. He is following a generally unhealthy diet. When asked about meal planning, he reported none. He has had a previous visit with a dietitian. He never participates in exercise. His home blood glucose trend is increasing steadily. His breakfast blood glucose range is generally >200 mg/dl. His bedtime blood glucose range is generally >200 mg/dl. His overall blood glucose range is >200 mg/dl. (He presents with still significantly  above target glycemic profile.  His previsit A1c was 10.5%, unchanged from from his prior visit.  Overall improved from 13.8% from last year.  He did not document any hypoglycemia.   ) An ACE inhibitor/angiotensin II receptor blocker is being taken. He does not see a podiatrist.Eye exam is current.  Hyperlipidemia This is a chronic problem. The current episode started more than  1 year ago. The problem is uncontrolled. Exacerbating diseases include diabetes, hypothyroidism and obesity. Associated symptoms include myalgias. Pertinent negatives include no chest pain or shortness of breath. Current antihyperlipidemic treatment includes ezetimibe. Risk factors for coronary artery disease include dyslipidemia, diabetes mellitus, hypertension, male sex, family history, obesity and a sedentary lifestyle.  Hypertension This is a chronic problem. The current episode started more than 1 year ago. The problem is controlled. Pertinent negatives include no chest pain, headaches, neck pain, palpitations or shortness of breath. Risk factors for coronary artery disease include diabetes mellitus, dyslipidemia, family history, obesity, male gender, sedentary lifestyle and smoking/tobacco exposure. Past treatments include angiotensin blockers. Hypertensive end-organ damage includes CAD/MI, CVA, heart failure and PVD. Identifiable causes of hypertension include a thyroid problem.  Thyroid Problem Presents for initial visit. Onset time: He has taken levothyroxine for at least 2 years. Patient reports no constipation, diarrhea, fatigue or palpitations. The symptoms have been stable. Past treatments include levothyroxine. His past medical history is significant for diabetes, heart failure and hyperlipidemia. Risk factors include family history of goiter.    Review of Systems  Constitutional:  Negative for chills, fatigue, fever and unexpected weight change.  HENT:  Negative for dental problem, mouth sores and trouble swallowing.   Eyes:  Negative for visual disturbance.  Respiratory:  Negative for cough, choking, chest tightness, shortness of breath and wheezing.   Cardiovascular:  Negative for chest pain, palpitations and leg swelling.  Gastrointestinal:  Negative for abdominal distention, abdominal pain, constipation, diarrhea, nausea and vomiting.  Endocrine: Positive for polydipsia and polyuria.  Negative for polyphagia.  Genitourinary:  Negative for dysuria, flank pain, hematuria and urgency.  Musculoskeletal:  Positive for myalgias. Negative for back pain, gait problem and neck pain.  Skin:  Negative for pallor, rash and wound.  Neurological:  Negative for seizures, syncope, weakness, numbness and headaches.  Psychiatric/Behavioral:  Negative for confusion and dysphoric mood.    Objective:    Vitals with BMI 11/11/2020 08/10/2020 05/20/2020  Height $Remov'6\' 6"'UfDKIz$  $Remove'6\' 6"'PJDumrs$  $RemoveB'6\' 6"'FFghQywX$   Weight 289 lbs 10 oz 277 lbs 13 oz 276 lbs  BMI 33.47 00.17 49.4  Systolic 496 759 163  Diastolic 88 76 80  Pulse 73 76 71    BP 133/88   Pulse 73   Ht $R'6\' 6"'Ru$  (1.981 m)   Wt 289 lb 9.6 oz (131.4 kg)   BMI 33.47 kg/m   Wt Readings from Last 3 Encounters:  11/11/20 289 lb 9.6 oz (131.4 kg)  08/10/20 277 lb 12.8 oz (126 kg)  05/20/20 276 lb (125.2 kg)     Physical Exam Constitutional:      General: He is not in acute distress.    Appearance: He is well-developed.  HENT:     Head: Normocephalic and atraumatic.  Neck:     Thyroid: No thyromegaly.     Trachea: No tracheal deviation.  Cardiovascular:     Rate and Rhythm: Normal rate.     Pulses:          Dorsalis pedis pulses are 1+ on the right side and 1+ on the left side.  Posterior tibial pulses are 1+ on the right side and 1+ on the left side.     Heart sounds: Normal heart sounds, S1 normal and S2 normal. No murmur heard.   No gallop.  Pulmonary:     Effort: Pulmonary effort is normal. No respiratory distress.     Breath sounds: Normal breath sounds. No wheezing.  Abdominal:     General: There is no distension.     Tenderness: There is no abdominal tenderness. There is no guarding.  Musculoskeletal:     Right shoulder: No swelling or deformity.     Cervical back: Normal range of motion and neck supple.  Skin:    General: Skin is warm and dry.     Findings: No rash.     Nails: There is no clubbing.  Neurological:     Mental Status: He  is alert and oriented to person, place, and time.     Cranial Nerves: No cranial nerve deficit.     Sensory: No sensory deficit.     Gait: Gait normal.     Deep Tendon Reflexes: Reflexes are normal and symmetric.  Psychiatric:        Speech: Speech normal.        Behavior: Behavior normal. Behavior is cooperative.        Thought Content: Thought content normal.        Judgment: Judgment normal.    CMP ( most recent) CMP     Component Value Date/Time   NA 131 (L) 10/15/2019 0046   K 3.7 10/15/2019 0046   CL 93 (L) 10/15/2019 0046   CO2 26 10/15/2019 0046   GLUCOSE 380 (H) 10/15/2019 0046   BUN 12 09/06/2020 0000   CREATININE 1.2 09/06/2020 0000   CREATININE 1.10 10/15/2019 0728   CALCIUM 10.1 10/15/2019 0046   PROT 7.0 12/23/2019 1512   ALBUMIN 4.4 12/23/2019 1512   AST 13 12/23/2019 1512   ALT 24 12/23/2019 1512   ALKPHOS 81 12/23/2019 1512   BILITOT <0.2 12/23/2019 1512   GFRNONAA >60 10/15/2019 0728   GFRAA >60 10/15/2019 0728     Diabetic Labs (most recent): Lab Results  Component Value Date   HGBA1C 10.5 09/06/2020   HGBA1C 10.7 (A) 08/10/2020   HGBA1C 10.3 02/18/2020     Lipid Panel ( most recent) Lipid Panel     Component Value Date/Time   CHOL 265 (A) 07/21/2020 0000   CHOL 235 (H) 12/23/2019 1512   TRIG 741 (A) 07/21/2020 0000   HDL 24 (A) 07/21/2020 0000   HDL 25 (L) 12/23/2019 1512   CHOLHDL 9.4 (H) 12/23/2019 1512   CHOLHDL NOT REPORTED DUE TO HIGH TRIGLYCERIDES 10/15/2019 0728   VLDL UNABLE TO CALCULATE IF TRIGLYCERIDE OVER 400 mg/dL 10/15/2019 0728   LDLCALC 109 07/21/2020 0000   LDLCALC 150 (H) 12/23/2019 1512   LDLDIRECT 143 (H) 12/23/2019 1511   LDLDIRECT UNABLE TO CALCULATE IF TRIGLYCERIDE IS >1293 mg/dL 10/15/2019 0728   LABVLDL 60 (H) 12/23/2019 1512      Lab Results  Component Value Date   TSH 12.90 (A) 07/21/2020   TSH 29.00 (A) 04/05/2020   TSH 49.10 (A) 02/18/2020   TSH 83.261 (H) 10/15/2019   TSH 60.657 (H) 01/05/2018    TSH 84.910 (H) 11/13/2017   TSH >100.000 (H) 10/06/2013   FREET4 <0.25 (L) 10/15/2019   FREET4 0.24 (L) 10/06/2013       Assessment & Plan:   1. DM type 2 causing vascular disease ([place])  -  TERRIAN SENTELL has currently uncontrolled symptomatic type 2 DM since  50 years of age.  He presents with still significantly above target glycemic profile.  His previsit A1c was 10.5%, unchanged from from his prior visit.  Overall improved from 13.8% from last year.  He did not document any hypoglycemia.     - Recent labs reviewed. - I had a long discussion with him about the progressive nature of diabetes and the pathology behind its complications. -his diabetes is complicated by coronary artery disease, CVA, heart failure, peripheral artery disease, obesity/sedentary life, smoking and he remains at a high risk for more acute and chronic complications which include CAD, CVA, CKD, retinopathy, and neuropathy. These are all discussed in detail with him.  - I have counseled him on diet  and weight management  by adopting a carbohydrate restricted/protein rich diet. Patient is encouraged to switch to  unprocessed or minimally processed     complex starch and increased protein intake (animal or plant source), fruits, and vegetables. -  he is advised to stick to a routine mealtimes to eat 3 meals  a day and avoid unnecessary snacks ( to snack only to correct hypoglycemia).   - he acknowledges that there is a room for improvement in his food and drink choices. - Suggestion is made for him to avoid simple carbohydrates  from his diet including Cakes, Sweet Desserts, Ice Cream, Soda (diet and regular), Sweet Tea, Candies, Chips, Cookies, Store Bought Juices, Alcohol in Excess of  1-2 drinks a day, Artificial Sweeteners,  Coffee Creamer, and "Sugar-free" Products, Lemonade. This will help patient to have more stable blood glucose profile and potentially avoid unintended weight gain.   - he will be scheduled  with Jearld Fenton, RDN, CDE for diabetes education.  - I have approached him with the following individualized plan to manage  his diabetes and patient agrees:   -In light of his chronic glycemic burden, he will continue to need insulin treatment in order for him to achieve control of diabetes to target.   -He will benefit from premixed insulin multiple daily injections after he finishes current supplies of Lantus.  I discussed and initiated Humalog 75/25 60 units with breakfast and 60 units with supper for P[place]al blood glucose readings above 90 mg per DL.  Patient is advised to monitor blood glucose 4 times a day and will benefit from a CGM.  I discussed and prescribed the freestyle libre device for him.  - he is warned not to take insulin without proper monitoring per orders.  - he is encouraged to call clinic for blood glucose levels less than 70 or above 300 mg /dl. - he is advised to continue Januvia 100 mg p.o. daily, glipizide 5 mg XL p.o. daily at breakfast.     - Specific targets for  A1c;  LDL, HDL,  and Triglycerides were discussed with the patient.  2) Blood Pressure /Hypertension:   His blood pressure is controlled to target. he is advised to continue his current medications including losartan 25 mg p.o. daily with breakfast . 3) Lipids/Hyperlipidemia:   He has severely uncontrolled lipid panel, although her triglycerides are slowly improving to 741 from 2400+.  Her LDL of 109.     He does not tolerate statins.  Patient is on Zetia 10 mg p.o. daily as well as fenofibrate 145 mg p.o. daily at bedtime.   -Has responded to Repatha, currently 140 mg of therapy every 14 days.   -  I discussed with the patient the fact that diabetes management with insulin will also help with hypertriglyceridemia.   4)  Weight/Diet:  Body mass index is 33.47 kg/m.  -   clearly complicating his diabetes care.   he is  a candidate for weight loss. I discussed with him the fact that loss of 5 -  10% of his  current body weight will have the most impact on his diabetes management.  Exercise, and detailed carbohydrates information provided  -  detailed on discharge instructions.  5) hypothyroidism She has questionable compliance.  The circumstance of his diagnosis are not available to review.  He is currently on Euthyrox 250 mcg p.o. daily before breakfast.   - We discussed about the correct intake of his thyroid hormone, on empty stomach at fasting, with water, separated by at least 30 minutes from breakfast and other medications,  and separated by more than 4 hours from calcium, iron, multivitamins, acid reflux medications (PPIs). -Patient is made aware of the fact that thyroid hormone replacement is needed for life, dose to be adjusted by periodic monitoring of thyroid function tests.  6) vitamin D deficiency He remains profoundly vitamin D deficient.  He is advised to continue ergocalciferol 50, 000 units weekly.   7) Chronic Care/Health Maintenance:  -he  is on ARB and did not tolerate statin medications and  is encouraged to initiate and continue to follow up with Ophthalmology, Dentist,  Podiatrist at least yearly or according to recommendations, and advised to  quit smoking. I have recommended yearly flu vaccine and pneumonia vaccine at least every 5 years; moderate intensity exercise for up to 150 minutes weekly; and  sleep for at least 7 hours a day.   POCT ABI Results on December 11, 2019.  His ABI is abnormal. Right ABI: 1.43      left ABI: 1.32.  Right leg systolic / diastolic: 440/102 mmHg Left leg systolic / diastolic: 725/36 mmHg  Arm systolic / diastolic: 644/03 mmHG Evaluated by vascular surgery, no significant occlusive peripheral arterial disease was detected.  - he is  advised to maintain close follow up with [place] Nation, MD for primary care needs, as well as his other providers for optimal and coordinated care.   I spent 41 minutes in the care of the  patient today including review of labs from [facility], Lipids, Thyroid Function, Hematology (current and previous including abstractions from other facilities); face-to-face time discussing  his blood glucose readings/logs, discussing hypoglycemia and hyperglycemia episodes and symptoms, medications doses, his options of short and long term treatment based on the latest standards of care / guidelines;  discussion about incorporating lifestyle medicine;  and documenting the encounter.    Please refer to Patient Instructions for Blood Glucose Monitoring and Insulin/Medications Dosing Guide"  in media tab for additional information. Please  also refer to " Patient Self Inventory" in the Media  tab for reviewed elements of pertinent patient history.  Lisa Roca participated in the discussions, expressed understanding, and voiced agreement with the above plans.  All questions were answered to his satisfaction. he is encouraged to contact clinic should he have any questions or concerns prior to his return visit. Follow up plan: - Return in about 9 weeks (around 01/13/2021) for Bring Meter and Logs- A1c in Office.  Glade Lloyd, MD [facility] [address] [place], [place] [address] Phone: [phone]  Fax: [phone]    11/11/2020, 1:49 PM  This note  was partially dictated with voice recognition software. Similar sounding words can be transcribed inadequately or may not  be corrected upon review.

## 2020-11-25 ENCOUNTER — Encounter: Payer: Self-pay | Admitting: Cardiology

## 2020-11-25 ENCOUNTER — Ambulatory Visit (INDEPENDENT_AMBULATORY_CARE_PROVIDER_SITE_OTHER): Payer: Medicare Other | Admitting: Cardiology

## 2020-11-25 VITALS — BP 118/82 | HR 66 | Ht 78.0 in | Wt 291.2 lb

## 2020-11-25 DIAGNOSIS — I2510 Atherosclerotic heart disease of native coronary artery without angina pectoris: Secondary | ICD-10-CM

## 2020-11-25 DIAGNOSIS — E782 Mixed hyperlipidemia: Secondary | ICD-10-CM | POA: Diagnosis not present

## 2020-11-25 DIAGNOSIS — I255 Ischemic cardiomyopathy: Secondary | ICD-10-CM | POA: Diagnosis not present

## 2020-11-25 MED ORDER — FUROSEMIDE 20 MG PO TABS: 20.0000 mg | ORAL_TABLET | ORAL | 2 refills | Status: DC | PRN

## 2020-11-25 NOTE — Progress Notes (Signed)
Clinical Summary Mr. [name] is a 50 y.o.male seen today for follow up of the following medical problems.        1. CAD/Chest pain/Chronic systolic HF - admitted 05/2917 with chest pain concerning for unstable angina.  11/2017 coronary CTA with severe prox LAD disease and possible mid LAD disease and D1.   11/2017 cath LM patent, LAD mid 80 to 90%, LCX distal 90%, OM2 90%, LPDA 90%, RCA small with prox 99% LVgram 35-45% (no echo on file). DES x 2 to LAD, DES to OM,  trifurcation lesion distal LCX, LPL and PDA recs for medical therapy, poor pci target. RCA small vessel treated medically.    Interventional cards recommended extended DAPT    01/2018 echo: LVEF 35-40%, grade I diastoluc dysfunction  - bradycardia on beta blocketers. History of rhabdo on crestor. Low bp and dizzienss on entresto.       10/2018 nuclear stress: no clear ischemia. LVEF 46% - compliant with meds  - no exertional chest pain symptoms.     10/2019 echo  LVEF 50-55%, no WMAs, indet diastolic fxn, nomral RV    - no chest pain. Chronic stable SOB  - compliant with meds    2. COPD - abnormal PFTs 02/2019 - on dulera, followed by pcp      2. Hyperlipidemia - history of rhabdo on crestor - previously extraordinarily high TGs in setting of poorly controlled DM2, hypothyroidism  -followed in lipid clinic. On repatha, zetia, fenofirbate, vascepa 12/2019 TC 235 TG 321 HDL 25 LDL 150. Direct LDL 143   - upcoming labs with pcp   3. History of CVA - history of CVA 11/2017 - had underwent an image-guided diagnostic cerebral angiogram 12/01/2017 with Dr. 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 He then underwent an image-guided cerebral angiogram with revascularization of his vertebrobasilar junction/proximal basilar artery stenosis using stent assisted angioplasty 12/03/2017 by Dr. [name] - later had ISR requiring repeat procedure 02/2018       SH: has 2 boxer dogs at home, 33 and 3.  Past Medical History:  Diagnosis Date   Asthma    Chest pain    CHF (congestive heart failure) ([place])    Coronary artery disease    a. s/p DES x2 to LAD and DES to OM in 11/2017   Depression    Dyspnea    GERD (gastroesophageal reflux disease)    Headache    History of kidney stones    History of rhabdomyolysis    Hyperlipidemia    Hypertension    Hypothyroidism    Ischemic cardiomyopathy    a. 11/2017: echo showing EF of 35-40%, diffuse HK, and Grade 2 DD   Kidney calculus 2014   Pericardial effusion    Small, by dobutamine echocardiogram, 04/2006   PONV (postoperative nausea and vomiting)    Sleep apnea    does not wear cpap   Stroke [facility])    Tobacco abuse    Type 2 diabetes mellitus without complications ([place]) 1/66/0600   Vitamin D deficiency      Allergies  Allergen Reactions   Crestor [Rosuvastatin] Other (See Comments)    rhabdomyolosis   Trulicity [Dulaglutide] Nausea And Vomiting   Bee Venom Swelling    SWELLING REACTION UNSPECIFIED      Current Outpatient Medications  Medication Sig Dispense Refill   aspirin 81 MG EC tablet Take 1 tablet (81 mg total) by mouth daily. With food 30 tablet 0  Blood Glucose Monitoring Suppl (ACCU-CHEK GUIDE ME) w/Device KIT 1 Piece by Does not apply route as directed. 1 kit 0   Blood Glucose Monitoring Suppl w/Device KIT Dispense based on patient and insurance preference. Check blood sugar daily before breakfast (ICD9 250.0) 1 kit 1   buPROPion (WELLBUTRIN SR) 150 MG 12 hr tablet Take by mouth.     Cholecalciferol 1.25 MG (50000 UT) capsule Take 1 capsule by mouth once a week.     clopidogrel (PLAVIX) 75 MG tablet Take 1 tablet (75 mg total) by mouth daily. 30 tablet 1   Continuous Blood Gluc Receiver (FREESTYLE LIBRE 2 READER) DEVI As directed 1 each 0   Continuous Blood Gluc Sensor (FREESTYLE LIBRE 2 SENSOR) MISC 1 Piece by Does not apply route every 14 (fourteen) days. 2 each 3    DULERA 200-5 MCG/ACT AERO Inhale 2 puffs into the lungs 2 (two) times daily. 8.8 g 1   escitalopram (LEXAPRO) 10 MG tablet Take 10 mg by mouth daily. (Patient not taking: Reported on 11/11/2020)     EUTHYROX 200 MCG tablet Take 200 mcg by mouth at bedtime.     EUTHYROX 50 MCG tablet Take 50 mcg by mouth daily.     Evolocumab (REPATHA SURECLICK) 938 MG/ML SOAJ Inject 1 pen into the skin every 14 (fourteen) days. 1 mL 11   ezetimibe (ZETIA) 10 MG tablet Take 1 tablet (10 mg total) by mouth daily. 30 tablet 1   fenofibrate (TRICOR) 145 MG tablet Take 1 tablet (145 mg total) by mouth daily. 30 tablet 1   gabapentin (NEURONTIN) 300 MG capsule One po q AM, one po q PM and 2 po qHS 120 capsule 11   glipiZIDE (GLUCOTROL XL) 5 MG 24 hr tablet Take 1 tablet (5 mg total) by mouth daily with breakfast. 30 tablet 3   glucose blood (ACCU-CHEK GUIDE) test strip Test glucose 2 times a day 100 each 2   Insulin Lispro Prot & Lispro (HUMALOG MIX 75/25 KWIKPEN) (75-25) 100 UNIT/ML Kwikpen Inject 60 Units into the skin 2 (two) times daily before a meal. 30 mL 2   Insulin Pen Needle (B-D ULTRAFINE III SHORT PEN) 31G X 8 MM MISC 1 each by Does not apply route as directed. 100 each 3   losartan (COZAAR) 25 MG tablet Take 0.5 tablets (12.5 mg total) by mouth daily. 15 tablet 1   nitroGLYCERIN (NITROSTAT) 0.4 MG SL tablet Place 1 tablet (0.4 mg total) under the tongue every 5 (five) minutes as needed for chest pain. 25 tablet 1   pantoprazole (PROTONIX) 40 MG tablet Take 1 tablet (40 mg total) by mouth daily. 90 tablet 1   sitaGLIPtin (JANUVIA) 100 MG tablet Take 1 tablet (100 mg total) by mouth daily. 30 tablet 1   VASCEPA 1 g capsule Take 2 capsules (2 g total) by mouth 2 (two) times daily. 360 capsule 3   No current facility-administered medications for this visit.     Past Surgical History:  Procedure Laterality Date   APPENDECTOMY     CARDIAC CATHETERIZATION  09/2012   "Nonobstructive CAD with 30% proximal, 40%  mid LAD disease; 30% proximal CFX; EF 55-65%"   CHOLECYSTECTOMY N/A 01/27/2013   Procedure: LAPAROSCOPIC CHOLECYSTECTOMY;  Surgeon: Jamesetta So, MD;  Location: [facility];  Service: General;  Laterality: N/A;   CORONARY STENT INTERVENTION N/A 11/15/2017   Procedure: CORONARY STENT INTERVENTION;  Surgeon: Leonie Man, MD;  Location: [place] [facility];  Service: Cardiovascular;  Laterality: N/A;   HERNIA REPAIR     As a child   INSERTION OF MESH N/A 11/13/2012   Procedure: INSERTION OF MESH;  Surgeon: Jamesetta So, MD;  Location: [facility];  Service: General;  Laterality: N/A;   IR ANGIO INTRA EXTRACRAN SEL COM CAROTID INNOMINATE BILAT MOD SED  12/01/2017   IR ANGIO INTRA EXTRACRAN SEL COM CAROTID INNOMINATE BILAT MOD SED  02/13/2018   IR ANGIO VERTEBRAL SEL SUBCLAVIAN INNOMINATE UNI L MOD SED  02/13/2018   IR ANGIO VERTEBRAL SEL VERTEBRAL BILAT MOD SED  12/01/2017   IR ANGIO VERTEBRAL SEL VERTEBRAL UNI R MOD SED  02/13/2018   IR INTRA CRAN STENT  12/03/2017   IR PTA INTRACRANIAL  02/25/2018   IR US GUIDE VASC ACCESS RIGHT  02/13/2018   KIDNEY STONE SURGERY     LEFT HEART CATH AND CORONARY ANGIOGRAPHY N/A 11/15/2017   Procedure: LEFT HEART CATH AND CORONARY ANGIOGRAPHY;  Surgeon: Leonie Man, MD;  Location: [place] [facility];  Service: Cardiovascular;  Laterality: N/A;   PERCUTANEOUS NEPHROLITHOTRIPSY     RADIOLOGY WITH ANESTHESIA Left 12/03/2017   Procedure: Angioplasty with possible stenting of left VBJ;  Surgeon: Luanne Bras, MD;  Location: [place];  Service: Radiology;  Laterality: Left;   RADIOLOGY WITH ANESTHESIA N/A 02/25/2018   Procedure: STENT PLACEMENT;  Surgeon: Luanne Bras, MD;  Location: [place];  Service: Radiology;  Laterality: N/A;   UMBILICAL HERNIA REPAIR N/A 11/13/2012   Procedure: UMBILICAL HERNIORRHAPHY;  Surgeon: Jamesetta So, MD;  Location: [facility];  Service: General;  Laterality: N/A;   VARICOCELECTOMY       Allergies  Allergen Reactions   Crestor  [Rosuvastatin] Other (See Comments)    rhabdomyolosis   Trulicity [Dulaglutide] Nausea And Vomiting   Bee Venom Swelling    SWELLING REACTION UNSPECIFIED       Family History  Problem Relation Age of Onset   Coronary artery disease Father    Hypertension Father    Hyperlipidemia Father    Diabetes Father    Congestive Heart Failure Father 35   Arrhythmia Father        had an ICD   Diabetes Mother    Hypertension Mother    Hyperlipidemia Mother    Obesity Mother 68       died after bariatric surgery, liver failure and infection   Coronary artery disease Maternal Grandfather        both grandfathers and several uncles   Coronary artery disease Other    Diabetes Sister        both sisters   Stroke Maternal Aunt    Stroke Maternal Uncle      Social History Mr. Katich reports that he has been smoking cigarettes. He started smoking about 35 years ago. He has a 26.00 pack-year smoking history. He quit smokeless tobacco use about 33 years ago.  His smokeless tobacco use included snuff and chew. Mr. Berti reports no history of alcohol use.   Review of Systems CONSTITUTIONAL: No weight loss, fever, chills, weakness or fatigue.  HEENT: Eyes: No visual loss, blurred vision, double vision or yellow sclerae.No hearing loss, sneezing, congestion, runny nose or sore throat.  SKIN: No rash or itching.  CARDIOVASCULAR: per hpi RESPIRATORY: No shortness of breath, cough or sputum.  GASTROINTESTINAL: No anorexia, nausea, vomiting or diarrhea. No abdominal pain or blood.  GENITOURINARY: No burning on urination, no polyuria NEUROLOGICAL: No headache, dizziness, syncope, paralysis, ataxia, numbness or tingling in the  extremities. No change in bowel or bladder control.  MUSCULOSKELETAL: No muscle, back pain, joint pain or stiffness.  LYMPHATICS: No enlarged nodes. No history of splenectomy.  PSYCHIATRIC: No history of depression or anxiety.  ENDOCRINOLOGIC: No reports of sweating, cold  or heat intolerance. No polyuria or polydipsia.  Marland Kitchen   Physical Examination Today's Vitals   11/25/20 1502  BP: 118/82  Pulse: 66  SpO2: 99%  Weight: 291 lb 3.2 oz (132.1 kg)  Height: 6' 6"  (1.981 m)   Body mass index is 33.65 kg/m.  Gen: resting comfortably, no acute distress HEENT: no scleral icterus, pupils equal round and reactive, no palptable cervical adenopathy,  CV: RRR, no m/r/g no jvd Resp: Clear to auscultation bilaterally GI: abdomen is soft, non-tender, non-distended, normal bowel sounds, no hepatosplenomegaly MSK: extremities are warm, no edema.  Skin: warm, no rash Neuro:  no focal deficits Psych: appropriate affect   Diagnostic Studies  11/2017 cath There is moderate left ventricular systolic dysfunction. The left ventricular ejection fraction is 35-45% by visual estimate. LV end diastolic pressure is mildly elevated. ----------------------------------------- ANGIOGRAPHY- PCI LESION #1: Mid LAD lesion is 80% stenosed. A drug-eluting stent was successfully placed using a STENT SYNERGY DES 2.25X16. -Postdilated to 2.4 mm Post intervention, there is a 0% residual stenosis. LESION #2: mid LAD to Dist LAD lesion is 90% stenosed. A drug-eluting stent was successfully placed using a STENT SYNERGY DES 2.25X12. -Postdilated to 2.4 mm Post intervention, there is a 0% residual stenosis. Prox LAD lesion is 40% stenosed -prior to lesion 1; apical, after lesion 2 A drug-eluting stent was successfully placed using a STENT SYNERGY DES 2.25X12. -Postdilated to 2.4 mm ----------------------------------------- Post intervention, there is a 0% residual stenosis. Trifurcation 90% lesion at the distal circumflex into LPL and PDA -small caliber distal vessels. Best treated medically as this is not a very good PCI target Prox small caliber nondominant RCA lesion is 99% stenosed.   Severe distal OM 2 -DES PCI with Synergy 2.25 mm x16 mm (2.4 mm); severe trifurcation disease with a  very distal AV groove circumflex prior to PDA and PL Kell Ferris -medical management.   Subtotally occluded nondominant RCA Moderately reduced LVEF of roughly 40% with mildly elevated LVEDP.   Plan: Transfer to postprocedure unit for post PCI monitoring and TR removal Continue aggressive risk factor modification with statin and beta-blocker etc.   Recommend uninterrupted dual antiplatelet therapy with Aspirin 37m daily and Ticagrelor 929mtwice daily for a minimum of 12 months (ACS - Class I recommendation). -Would strongly consider reducing to 60 mg dose ticagrelor at 9-12 months and continue for an additional year.   Would be okay to stop aspirin after 3 months if necessary.  Okay to stop Brilinta after 6 months for procedures if necessary.     10/2018 nuclear stress There was no ST segment deviation noted during stress. Defect 1: There is a small defect of mild severity present in the mid inferoseptal location. This is likely due to soft tissue attenuation artifact. This is a low risk study. No ischemic zones. Nuclear stress EF: 46     8/2021echo IMPRESSIONS     1. Left ventricular ejection fraction, by estimation, is 50 to 55%. The  left ventricle has low normal function. The left ventricle has no regional  wall motion abnormalities. There is moderate left ventricular hypertrophy.  Left ventricular diastolic  parameters are indeterminate.   2. Right ventricular systolic function is normal. The right ventricular  size is normal.  3. The mitral valve is normal in structure. No evidence of mitral valve  regurgitation. No evidence of mitral stenosis.   4. The aortic valve is tricuspid. Aortic valve regurgitation is not  visualized. No aortic stenosis is present.   5. The inferior vena cava is normal in size with greater than 50%  respiratory variability, suggesting right atrial pressure of 3 mmHg.     08/2020 echo   NORMAL LEFT VENTRICULAR FUNCTION WITH MILD LVH    NORMAL LA  PRESSURES WITH DIASTOLIC DYSFUNCTION    NORMAL RIGHT VENTRICULAR SYSTOLIC FUNCTION    VALVULAR REGURGITATION: TRIVIAL MR, TRIVIAL TR    NO VALVULAR STENOSIS       Assessment and Plan   1. CAD/ICM/SOB - no recent symptoms. By echo here in 2021 and [facility] 08/2020 LVEF has normalized - continue current meds EKG today SR, chronic ST/T changes    2. Hyperlipidemia - request pcp labs, continue current meds - intolerant to statins  3. Leg edema - intermittent, start lasix 29m prn   JArnoldo Lenis M.D.

## 2020-11-25 NOTE — Patient Instructions (Signed)
Medication Instructions:  °Begin Lasix 20mg as needed for swelling °Continue all other medications.    ° °Labwork: °none ° °Testing/Procedures: °none ° °Follow-Up: °6 months  ° °Any Other Special Instructions Will Be Listed Below (If Applicable). ° ° °If you need a refill on your cardiac medications before your next appointment, please call your pharmacy. ° °

## 2020-12-21 DIAGNOSIS — E785 Hyperlipidemia, unspecified: Secondary | ICD-10-CM | POA: Diagnosis not present

## 2020-12-21 DIAGNOSIS — I2510 Atherosclerotic heart disease of native coronary artery without angina pectoris: Secondary | ICD-10-CM | POA: Diagnosis not present

## 2020-12-21 DIAGNOSIS — E1159 Type 2 diabetes mellitus with other circulatory complications: Secondary | ICD-10-CM | POA: Diagnosis not present

## 2020-12-21 DIAGNOSIS — I10 Essential (primary) hypertension: Secondary | ICD-10-CM | POA: Diagnosis not present

## 2020-12-21 DIAGNOSIS — J449 Chronic obstructive pulmonary disease, unspecified: Secondary | ICD-10-CM | POA: Diagnosis not present

## 2020-12-21 DIAGNOSIS — E039 Hypothyroidism, unspecified: Secondary | ICD-10-CM | POA: Diagnosis not present

## 2020-12-21 DIAGNOSIS — E1140 Type 2 diabetes mellitus with diabetic neuropathy, unspecified: Secondary | ICD-10-CM | POA: Diagnosis not present

## 2020-12-21 DIAGNOSIS — F17200 Nicotine dependence, unspecified, uncomplicated: Secondary | ICD-10-CM | POA: Diagnosis not present

## 2020-12-23 DIAGNOSIS — K219 Gastro-esophageal reflux disease without esophagitis: Secondary | ICD-10-CM | POA: Diagnosis not present

## 2020-12-23 DIAGNOSIS — I2510 Atherosclerotic heart disease of native coronary artery without angina pectoris: Secondary | ICD-10-CM | POA: Diagnosis not present

## 2020-12-23 DIAGNOSIS — J449 Chronic obstructive pulmonary disease, unspecified: Secondary | ICD-10-CM | POA: Diagnosis not present

## 2020-12-23 DIAGNOSIS — E1159 Type 2 diabetes mellitus with other circulatory complications: Secondary | ICD-10-CM | POA: Diagnosis not present

## 2020-12-23 DIAGNOSIS — I10 Essential (primary) hypertension: Secondary | ICD-10-CM | POA: Diagnosis not present

## 2020-12-23 DIAGNOSIS — E1140 Type 2 diabetes mellitus with diabetic neuropathy, unspecified: Secondary | ICD-10-CM | POA: Diagnosis not present

## 2020-12-23 DIAGNOSIS — E039 Hypothyroidism, unspecified: Secondary | ICD-10-CM | POA: Diagnosis not present

## 2020-12-23 DIAGNOSIS — F17200 Nicotine dependence, unspecified, uncomplicated: Secondary | ICD-10-CM | POA: Diagnosis not present

## 2021-01-07 ENCOUNTER — Other Ambulatory Visit: Payer: Self-pay | Admitting: Cardiology

## 2021-01-13 ENCOUNTER — Ambulatory Visit: Payer: Medicaid Other | Admitting: "Endocrinology

## 2021-01-31 ENCOUNTER — Encounter: Payer: Self-pay | Admitting: "Endocrinology

## 2021-01-31 ENCOUNTER — Ambulatory Visit (INDEPENDENT_AMBULATORY_CARE_PROVIDER_SITE_OTHER): Payer: Medicare Other | Admitting: "Endocrinology

## 2021-01-31 ENCOUNTER — Other Ambulatory Visit: Payer: Self-pay

## 2021-01-31 VITALS — BP 153/97 | HR 84 | Ht 78.0 in | Wt 279.2 lb

## 2021-01-31 DIAGNOSIS — I10 Essential (primary) hypertension: Secondary | ICD-10-CM

## 2021-01-31 DIAGNOSIS — E1159 Type 2 diabetes mellitus with other circulatory complications: Secondary | ICD-10-CM

## 2021-01-31 DIAGNOSIS — E039 Hypothyroidism, unspecified: Secondary | ICD-10-CM

## 2021-01-31 DIAGNOSIS — E782 Mixed hyperlipidemia: Secondary | ICD-10-CM | POA: Diagnosis not present

## 2021-01-31 LAB — POCT GLYCOSYLATED HEMOGLOBIN (HGB A1C): HbA1c, POC (controlled diabetic range): 10.2 % — AB (ref 0.0–7.0)

## 2021-01-31 NOTE — Patient Instructions (Signed)

## 2021-01-31 NOTE — Progress Notes (Signed)
01/31/2021, 4:44 PM  Endocrinology follow-up note   Subjective:    Patient ID: Devin [name], male    DOB: [birth year].  Devin [name] is being seen in follow-up after he was seen in consultation for management of currently uncontrolled symptomatic diabetes requested by  Dubois Nation, MD.    Past Medical History:  Diagnosis Date   Asthma    Chest pain    CHF (congestive heart failure) ([place])    Coronary artery disease    a. s/p DES x2 to LAD and DES to OM in 11/2017   Depression    Dyspnea    GERD (gastroesophageal reflux disease)    Headache    History of kidney stones    History of rhabdomyolysis    Hyperlipidemia    Hypertension    Hypothyroidism    Ischemic cardiomyopathy    a. 11/2017: echo showing EF of 35-40%, diffuse HK, and Grade 2 DD   Kidney calculus 2014   Pericardial effusion    Small, by dobutamine echocardiogram, 04/2006   PONV (postoperative nausea and vomiting)    Sleep apnea    does not wear cpap   Stroke ([place])    Tobacco abuse    Type 2 diabetes mellitus without complications ([place]) 2/63/7858   Vitamin D deficiency     Past Surgical History:  Procedure Laterality Date   APPENDECTOMY     CARDIAC CATHETERIZATION  09/2012   "Nonobstructive CAD with 30% proximal, 40% mid LAD disease; 30% proximal CFX; EF 55-65%"   CHOLECYSTECTOMY N/A 01/27/2013   Procedure: LAPAROSCOPIC CHOLECYSTECTOMY;  Surgeon: Jamesetta So, MD;  Location: [facility];  Service: General;  Laterality: N/A;   CORONARY STENT INTERVENTION N/A 11/15/2017   Procedure: CORONARY STENT INTERVENTION;  Surgeon: Leonie Man, MD;  Location: [facility];  Service: Cardiovascular;  Laterality: N/A;   HERNIA REPAIR     As a child   INSERTION OF MESH N/A 11/13/2012   Procedure: INSERTION OF MESH;  Surgeon: Jamesetta So, MD;  Location: [facility];  Service: General;  Laterality: N/A;   IR ANGIO INTRA EXTRACRAN  SEL COM CAROTID INNOMINATE BILAT MOD SED  12/01/2017   IR ANGIO INTRA EXTRACRAN SEL COM CAROTID INNOMINATE BILAT MOD SED  02/13/2018   IR ANGIO VERTEBRAL SEL SUBCLAVIAN INNOMINATE UNI L MOD SED  02/13/2018   IR ANGIO VERTEBRAL SEL VERTEBRAL BILAT MOD SED  12/01/2017   IR ANGIO VERTEBRAL SEL VERTEBRAL UNI R MOD SED  02/13/2018   IR INTRA CRAN STENT  12/03/2017   IR PTA INTRACRANIAL  02/25/2018   IR US GUIDE VASC ACCESS RIGHT  02/13/2018   KIDNEY STONE SURGERY     LEFT HEART CATH AND CORONARY ANGIOGRAPHY N/A 11/15/2017   Procedure: LEFT HEART CATH AND CORONARY ANGIOGRAPHY;  Surgeon: Leonie Man, MD;  Location: [facility];  Service: Cardiovascular;  Laterality: N/A;   PERCUTANEOUS NEPHROLITHOTRIPSY     RADIOLOGY WITH ANESTHESIA Left 12/03/2017   Procedure: Angioplasty with possible stenting of left VBJ;  Surgeon: Luanne Bras, MD;  Location: [facility];  Service: Radiology;  Laterality: Left;   RADIOLOGY WITH ANESTHESIA N/A 02/25/2018  Procedure: STENT PLACEMENT;  Surgeon: Luanne Bras, MD;  Location: [facility];  Service: Radiology;  Laterality: N/A;   UMBILICAL HERNIA REPAIR N/A 11/13/2012   Procedure: UMBILICAL HERNIORRHAPHY;  Surgeon: Jamesetta So, MD;  Location: [facility];  Service: General;  Laterality: N/A;   VARICOCELECTOMY      Social History   Socioeconomic History   Marital status: Divorced    Spouse name: Not on file   Number of children: Not on file   Years of education: Not on file   Highest education level: Not on file  Occupational History   Occupation: Disability     Comment:      Employer: [employer]  Tobacco Use   Smoking status: Every Day    Packs/day: 1.00    Years: 26.00    Pack years: 26.00    Types: Cigarettes    Start date: 03/13/1985   Smokeless tobacco: Former    Types: Snuff, Chew    Quit date: 03/14/1987  Vaping Use   Vaping Use: Former   Devices: quit in 2011  Substance and Sexual Activity   Alcohol use: No   Drug use: No   Sexual activity:  Not on file  Other Topics Concern   Not on file  Social History Narrative   Eats fast food daily   No exercise outside of work   Lives in [place] with his dog   MAINTENANCE TECHNICIAN   Caffeine use: tea/soda daily   Left handed    Social Determinants of Health   Financial Resource Strain: Not on file  Food Insecurity: Not on file  Transportation Needs: Not on file  Physical Activity: Not on file  Stress: Not on file  Social Connections: Not on file    Family History  Problem Relation Age of Onset   Coronary artery disease Father    Hypertension Father    Hyperlipidemia Father    Diabetes Father    Congestive Heart Failure Father 53   Arrhythmia Father        had an ICD   Diabetes Mother    Hypertension Mother    Hyperlipidemia Mother    Obesity Mother 82       died after bariatric surgery, liver failure and infection   Coronary artery disease Maternal Grandfather        both grandfathers and several uncles   Coronary artery disease Other    Diabetes Sister        both sisters   Stroke Maternal Aunt    Stroke Maternal Uncle     Outpatient Encounter Medications as of 01/31/2021  Medication Sig   Cholecalciferol (VITAMIN D3) 50 MCG (2000 UT) TABS Take 1 tablet by mouth daily.   glipiZIDE (GLUCOTROL XL) 5 MG 24 hr tablet Take 5 mg by mouth daily with breakfast.   pantoprazole (PROTONIX) 40 MG tablet Take 1 tablet by mouth once daily   VASCEPA 1 g capsule Take 2 capsules by mouth twice daily   aspirin 81 MG EC tablet Take 1 tablet (81 mg total) by mouth daily. With food   Blood Glucose Monitoring Suppl (ACCU-CHEK GUIDE ME) w/Device KIT 1 Piece by Does not apply route as directed.   Blood Glucose Monitoring Suppl w/Device KIT Dispense based on patient and insurance preference. Check blood sugar daily before breakfast (ICD9 250.0)   buPROPion (WELLBUTRIN SR) 150 MG 12 hr tablet Take 150 mg by mouth 2 (two) times daily.   clopidogrel (PLAVIX) 75 MG tablet Take 1 tablet (  75  mg total) by mouth daily.   Continuous Blood Gluc Receiver (FREESTYLE LIBRE 2 READER) DEVI As directed   Continuous Blood Gluc Sensor (FREESTYLE LIBRE 2 SENSOR) MISC 1 Piece by Does not apply route every 14 (fourteen) days.   DULERA 200-5 MCG/ACT AERO Inhale 2 puffs into the lungs 2 (two) times daily.   escitalopram (LEXAPRO) 10 MG tablet Take 10 mg by mouth daily. (Patient not taking: Reported on 01/31/2021)   EUTHYROX 200 MCG tablet Take 200 mcg by mouth at bedtime.   Evolocumab (REPATHA SURECLICK) 086 MG/ML SOAJ Inject 1 pen into the skin every 14 (fourteen) days.   ezetimibe (ZETIA) 10 MG tablet Take 1 tablet (10 mg total) by mouth daily.   famotidine (PEPCID) 40 MG tablet Take 40 mg by mouth daily.   fenofibrate (TRICOR) 145 MG tablet Take 1 tablet (145 mg total) by mouth daily.   fluticasone (FLONASE) 50 MCG/ACT nasal spray Place 2 sprays into both nostrils 2 (two) times daily.   furosemide (LASIX) 20 MG tablet Take 1 tablet (20 mg total) by mouth as needed for edema (swelling).   gabapentin (NEURONTIN) 300 MG capsule One po q AM, one po q PM and 2 po qHS   gemfibrozil (LOPID) 600 MG tablet Take 600 mg by mouth 2 (two) times daily.   glucose blood (ACCU-CHEK GUIDE) test strip Test glucose 2 times a day   Insulin Lispro Prot & Lispro (HUMALOG MIX 75/25 KWIKPEN) (75-25) 100 UNIT/ML Kwikpen Inject 60 Units into the skin 2 (two) times daily before a meal.   Insulin Pen Needle (B-D ULTRAFINE III SHORT PEN) 31G X 8 MM MISC 1 each by Does not apply route as directed.   levothyroxine (SYNTHROID) 88 MCG tablet Take 88 mcg by mouth daily.   nitroGLYCERIN (NITROSTAT) 0.4 MG SL tablet Place 1 tablet (0.4 mg total) under the tongue every 5 (five) minutes as needed for chest pain.   sitaGLIPtin (JANUVIA) 100 MG tablet Take 1 tablet (100 mg total) by mouth daily.   VENTOLIN HFA 108 (90 Base) MCG/ACT inhaler Inhale into the lungs.   [DISCONTINUED] Cholecalciferol 1.25 MG (50000 UT) capsule Take 1 capsule  by mouth once a week.   [DISCONTINUED] EUTHYROX 50 MCG tablet Take 50 mcg by mouth daily.   [DISCONTINUED] glipiZIDE (GLUCOTROL XL) 10 MG 24 hr tablet Take 10 mg by mouth daily.   [DISCONTINUED] glipiZIDE (GLUCOTROL XL) 5 MG 24 hr tablet Take 1 tablet (5 mg total) by mouth daily with breakfast.   [DISCONTINUED] losartan (COZAAR) 25 MG tablet Take 0.5 tablets (12.5 mg total) by mouth daily.   No facility-administered encounter medications on file as of 01/31/2021.    ALLERGIES: Allergies  Allergen Reactions   Crestor [Rosuvastatin] Other (See Comments)    rhabdomyolosis   Trulicity [Dulaglutide] Nausea And Vomiting   Bee Venom Swelling    SWELLING REACTION UNSPECIFIED     VACCINATION STATUS: Immunization History  Administered Date(s) Administered   Tdap 12/12/2015    Diabetes He presents for his follow-up diabetic visit. He has type 2 diabetes mellitus. Onset time: He was diagnosed at approximate age of 28 years. His disease course has been worsening. There are no hypoglycemic associated symptoms. Pertinent negatives for hypoglycemia include no confusion, headaches, pallor or seizures. Associated symptoms include foot paresthesias, polydipsia and polyuria. Pertinent negatives for diabetes include no chest pain, no fatigue, no polyphagia and no weakness. There are no hypoglycemic complications. Symptoms are worsening. Diabetic complications include a CVA, heart disease  and PVD. Risk factors for coronary artery disease include diabetes mellitus, dyslipidemia, family history, obesity, male sex, tobacco exposure and sedentary lifestyle. Current diabetic treatments: He is currently on Lantus 15 units nightly, Januvia 100 mg daily, Amaryl 2 mg p.o. daily. His weight is increasing steadily. He is following a generally unhealthy diet. When asked about meal planning, he reported none. He has had a previous visit with a dietitian. He never participates in exercise. His home blood glucose trend is  increasing steadily. His breakfast blood glucose range is generally >200 mg/dl. His bedtime blood glucose range is generally >200 mg/dl. His overall blood glucose range is >200 mg/dl. (He presents without any meter nor logs.  His point-of-care A1c is 10.2%, overall improving from 13.8%.  He denies hypoglycemia.    ) An ACE inhibitor/angiotensin II receptor blocker is being taken. He does not see a podiatrist.Eye exam is current.  Hyperlipidemia This is a chronic problem. The current episode started more than 1 year ago. The problem is uncontrolled. Exacerbating diseases include diabetes, hypothyroidism and obesity. Associated symptoms include myalgias. Pertinent negatives include no chest pain or shortness of breath. Current antihyperlipidemic treatment includes ezetimibe. Risk factors for coronary artery disease include dyslipidemia, diabetes mellitus, hypertension, male sex, family history, obesity and a sedentary lifestyle.  Hypertension This is a chronic problem. The current episode started more than 1 year ago. The problem is controlled. Pertinent negatives include no chest pain, headaches, neck pain, palpitations or shortness of breath. Risk factors for coronary artery disease include diabetes mellitus, dyslipidemia, family history, obesity, male gender, sedentary lifestyle and smoking/tobacco exposure. Past treatments include angiotensin blockers. Hypertensive end-organ damage includes CAD/MI, CVA, heart failure and PVD. Identifiable causes of hypertension include a thyroid problem.  Thyroid Problem Presents for initial visit. Onset time: He has taken levothyroxine for at least 2 years. Patient reports no constipation, diarrhea, fatigue or palpitations. The symptoms have been stable. Past treatments include levothyroxine. His past medical history is significant for diabetes, heart failure and hyperlipidemia. Risk factors include family history of goiter.    Review of Systems  Constitutional:   Negative for chills, fatigue, fever and unexpected weight change.  HENT:  Negative for dental problem, mouth sores and trouble swallowing.   Eyes:  Negative for visual disturbance.  Respiratory:  Negative for cough, choking, chest tightness, shortness of breath and wheezing.   Cardiovascular:  Negative for chest pain, palpitations and leg swelling.  Gastrointestinal:  Negative for abdominal distention, abdominal pain, constipation, diarrhea, nausea and vomiting.  Endocrine: Positive for polydipsia and polyuria. Negative for polyphagia.  Genitourinary:  Negative for dysuria, flank pain, hematuria and urgency.  Musculoskeletal:  Positive for myalgias. Negative for back pain, gait problem and neck pain.  Skin:  Negative for pallor, rash and wound.  Neurological:  Negative for seizures, syncope, weakness, numbness and headaches.  Psychiatric/Behavioral:  Negative for confusion and dysphoric mood.    Objective:    Vitals with BMI 01/31/2021 11/25/2020 11/11/2020  Height 6' 6" 6' 6" 6' 6"  Weight 279 lbs 3 oz 291 lbs 3 oz 289 lbs 10 oz  BMI 32.27 16.96 78.93  Systolic 810 175 102  Diastolic 97 82 88  Pulse 84 66 73    BP (!) 153/97   Pulse 84   Ht 6' 6" (1.981 m)   Wt 279 lb 3.2 oz (126.6 kg)   BMI 32.26 kg/m   Wt Readings from Last 3 Encounters:  01/31/21 279 lb 3.2 oz (126.6 kg)  11/25/20 291  lb 3.2 oz (132.1 kg)  11/11/20 289 lb 9.6 oz (131.4 kg)     Physical Exam Constitutional:      General: He is not in acute distress.    Appearance: He is well-developed.  HENT:     Head: Normocephalic and atraumatic.  Neck:     Thyroid: No thyromegaly.     Trachea: No tracheal deviation.  Cardiovascular:     Rate and Rhythm: Normal rate.     Pulses:          Dorsalis pedis pulses are 1+ on the right side and 1+ on the left side.       Posterior tibial pulses are 1+ on the right side and 1+ on the left side.     Heart sounds: Normal heart sounds, S1 normal and S2 normal. No murmur  heard.   No gallop.  Pulmonary:     Effort: Pulmonary effort is normal. No respiratory distress.     Breath sounds: Normal breath sounds. No wheezing.  Abdominal:     General: There is no distension.     Tenderness: There is no abdominal tenderness. There is no guarding.  Musculoskeletal:     Right shoulder: No swelling or deformity.     Cervical back: Normal range of motion and neck supple.  Skin:    General: Skin is warm and dry.     Findings: No rash.     Nails: There is no clubbing.  Neurological:     Mental Status: He is alert and oriented to person, place, and time.     Cranial Nerves: No cranial nerve deficit.     Sensory: No sensory deficit.     Gait: Gait normal.     Deep Tendon Reflexes: Reflexes are normal and symmetric.  Psychiatric:        Speech: Speech normal.        Behavior: Behavior normal. Behavior is cooperative.        Thought Content: Thought content normal.        Judgment: Judgment normal.    CMP ( most recent) CMP     Component Value Date/Time   NA 131 (L) 10/15/2019 0046   K 3.7 10/15/2019 0046   CL 93 (L) 10/15/2019 0046   CO2 26 10/15/2019 0046   GLUCOSE 380 (H) 10/15/2019 0046   BUN 12 09/06/2020 0000   CREATININE 1.2 09/06/2020 0000   CREATININE 1.10 10/15/2019 0728   CALCIUM 10.1 10/15/2019 0046   PROT 7.0 12/23/2019 1512   ALBUMIN 4.4 12/23/2019 1512   AST 13 12/23/2019 1512   ALT 24 12/23/2019 1512   ALKPHOS 81 12/23/2019 1512   BILITOT <0.2 12/23/2019 1512   GFRNONAA >60 10/15/2019 0728   GFRAA >60 10/15/2019 0728     Diabetic Labs (most recent): Lab Results  Component Value Date   HGBA1C 10.2 (A) 01/31/2021   HGBA1C 10.5 09/06/2020   HGBA1C 10.7 (A) 08/10/2020     Lipid Panel ( most recent) Lipid Panel     Component Value Date/Time   CHOL 265 (A) 07/21/2020 0000   CHOL 235 (H) 12/23/2019 1512   TRIG 741 (A) 07/21/2020 0000   HDL 24 (A) 07/21/2020 0000   HDL 25 (L) 12/23/2019 1512   CHOLHDL 9.4 (H) 12/23/2019 1512    CHOLHDL NOT REPORTED DUE TO HIGH TRIGLYCERIDES 10/15/2019 0728   VLDL UNABLE TO CALCULATE IF TRIGLYCERIDE OVER 400 mg/dL 10/15/2019 0728   LDLCALC 109 07/21/2020 0000   LDLCALC 150 (H)  12/23/2019 1512   LDLDIRECT 143 (H) 12/23/2019 1511   LDLDIRECT UNABLE TO CALCULATE IF TRIGLYCERIDE IS >1293 mg/dL 10/15/2019 0728   LABVLDL 60 (H) 12/23/2019 1512      Lab Results  Component Value Date   TSH 12.90 (A) 07/21/2020   TSH 29.00 (A) 04/05/2020   TSH 49.10 (A) 02/18/2020   TSH 83.261 (H) 10/15/2019   TSH 60.657 (H) 01/05/2018   TSH 84.910 (H) 11/13/2017   TSH >100.000 (H) 10/06/2013   FREET4 <0.25 (L) 10/15/2019   FREET4 0.24 (L) 10/06/2013       Assessment & Plan:   1. DM type 2 causing vascular disease ([place])  - Devin [name] has currently uncontrolled symptomatic type 2 DM since  50 years of age.  He presents without any meter nor logs.  His point-of-care A1c is 10.2%, overall improving from 13.8%.  He denies hypoglycemia.     - Recent labs reviewed. - I had a long discussion with him about the progressive nature of diabetes and the pathology behind its complications. -his diabetes is complicated by coronary artery disease, CVA, heart failure, peripheral artery disease, obesity/sedentary life, smoking and he remains at a high risk for more acute and chronic complications which include CAD, CVA, CKD, retinopathy, and neuropathy. These are all discussed in detail with him.  - I have counseled him on diet  and weight management  by adopting a carbohydrate restricted/protein rich diet. Patient is encouraged to switch to  unprocessed or minimally processed     complex starch and increased protein intake (animal or plant source), fruits, and vegetables. -  he is advised to stick to a routine mealtimes to eat 3 meals  a day and avoid unnecessary snacks ( to snack only to correct hypoglycemia).   - he acknowledges that there is a room for improvement in his food and drink choices. -  Suggestion is made for him to avoid simple carbohydrates  from his diet including Cakes, Sweet Desserts, Ice Cream, Soda (diet and regular), Sweet Tea, Candies, Chips, Cookies, Store Bought Juices, Alcohol in Excess of  1-2 drinks a day, Artificial Sweeteners,  Coffee Creamer, and "Sugar-free" Products, Lemonade. This will help patient to have more stable blood glucose profile and potentially avoid unintended weight gain.   - he will be scheduled with Jearld Fenton, RDN, CDE for diabetes education.  - I have approached him with the following individualized plan to manage  his diabetes and patient agrees:   -He has not engaged in proper monitoring and self-care.  In light of his presentation with chronic, severe hyperglycemia, he will be continued on multiple daily injections of insulin in order for him to achieve control of diabetes to target.    -He could not follow instructions on basal/bolus insulin, will be continued on premixed insulin twice daily.   -He is advised to continue Humalog 75/25 60 units with breakfast and 16 with supper, for Premeal blood glucose readings above 90 mg per DL.  Patient is advised to monitor blood glucose 4 times a day and will benefit from a CGM.  Brought his CGM device, will be helped with his first application.    - he is warned not to take insulin without proper monitoring per orders.  - he is encouraged to call clinic for blood glucose levels less than 70 or above 300 mg /dl. - he is advised to continue Januvia 100 mg p.o. daily, glipizide 5 mg XL p.o. daily at breakfast.     -  Specific targets for  A1c;  LDL, HDL,  and Triglycerides were discussed with the patient.  2) Blood Pressure /Hypertension:   -His blood pressure is  not controlled to target. he is advised to continue his current medications including losartan 25 mg p.o. daily with breakfast .  3) Lipids/Hyperlipidemia:   He has severely uncontrolled lipid panel, although her triglycerides are  slowly improving to 741 from 2400+.  Her LDL of 109.     He does not tolerate statins.  Patient is on Zetia 10 mg p.o. daily as well as fenofibrate 145 mg p.o. daily at bedtime.   -Has responded to Repatha, currently 140 mg of therapy every 14 days.   -  I discussed with the patient the fact that diabetes management with insulin will also help with hypertriglyceridemia.   4)  Weight/Diet:  Body mass index is 32.26 kg/m.  -   clearly complicating his diabetes care.   he is  a candidate for weight loss. I discussed with him the fact that loss of 5 - 10% of his  current body weight will have the most impact on his diabetes management.  Exercise, and detailed carbohydrates information provided  -  detailed on discharge instructions.  5) hypothyroidism She has questionable compliance.  The circumstance of his diagnosis are not available to review.  He is currently on Euthyrox 250 mcg p.o. daily before breakfast.   - We discussed about the correct intake of his thyroid hormone, on empty stomach at fasting, with water, separated by at least 30 minutes from breakfast and other medications,  and separated by more than 4 hours from calcium, iron, multivitamins, acid reflux medications (PPIs). -Patient is made aware of the fact that thyroid hormone replacement is needed for life, dose to be adjusted by periodic monitoring of thyroid function tests.  6) vitamin D deficiency He remains profoundly vitamin D deficient.  He is advised to continue ergocalciferol 50, 000 units weekly.   7) Chronic Care/Health Maintenance:  -he  is on ARB and did not tolerate statin medications and  is encouraged to initiate and continue to follow up with Ophthalmology, Dentist,  Podiatrist at least yearly or according to recommendations, and advised to  quit smoking. I have recommended yearly flu vaccine and pneumonia vaccine at least every 5 years; moderate intensity exercise for up to 150 minutes weekly; and  sleep for at  least 7 hours a day.   POCT ABI Results on December 11, 2019.  His ABI is abnormal. Right ABI: 1.43      left ABI: 1.32.  Right leg systolic / diastolic: 623/762 mmHg Left leg systolic / diastolic: 831/51 mmHg  Arm systolic / diastolic: 761/60 mmHG Evaluated by vascular surgery, no significant occlusive peripheral arterial disease was detected.  - he is  advised to maintain close follow up with Westville Nation, MD for primary care needs, as well as his other providers for optimal and coordinated care.   I spent 41 minutes in the care of the patient today including review of labs from [facility] [place], Lipids, Thyroid Function, Hematology (current and previous including abstractions from other facilities); face-to-face time discussing  his blood glucose readings/logs, discussing hypoglycemia and hyperglycemia episodes and symptoms, medications doses, his options of short and long term treatment based on the latest standards of care / guidelines;  discussion about incorporating lifestyle medicine;  and documenting the encounter.    Please refer to Patient Instructions for Blood Glucose Monitoring and Insulin/Medications Dosing Guide"  in media tab for additional information. Please  also refer to " Patient Self Inventory" in the Media  tab for reviewed elements of pertinent patient history.  Devin [name] participated in the discussions, expressed understanding, and voiced agreement with the above plans.  All questions were answered to his satisfaction. he is encouraged to contact clinic should he have any questions or concerns prior to his return visit. Follow up plan: - Return in about 2 weeks (around 02/14/2021) for F/U with Meter and Logs Only - no Labs.  Glade Lloyd, MD [facility] [address] [place], [place] [address] Phone: [phone]  Fax: [phone]    01/31/2021, 4:44 PM  This note was partially dictated with voice  recognition software. Similar sounding words can be transcribed inadequately or may not  be corrected upon review.

## 2021-02-11 ENCOUNTER — Other Ambulatory Visit: Payer: Self-pay | Admitting: Cardiology

## 2021-02-14 ENCOUNTER — Ambulatory Visit: Payer: Medicare Other | Admitting: "Endocrinology

## 2021-02-14 ENCOUNTER — Other Ambulatory Visit: Payer: Self-pay | Admitting: *Deleted

## 2021-02-14 MED ORDER — NITROGLYCERIN 0.4 MG SL SUBL
0.4000 mg | SUBLINGUAL_TABLET | SUBLINGUAL | 3 refills | Status: AC | PRN
Start: 2021-02-14 — End: ?

## 2021-02-16 ENCOUNTER — Ambulatory Visit (INDEPENDENT_AMBULATORY_CARE_PROVIDER_SITE_OTHER): Payer: Medicare Other | Admitting: Nurse Practitioner

## 2021-02-16 ENCOUNTER — Encounter: Payer: Self-pay | Admitting: Nurse Practitioner

## 2021-02-16 ENCOUNTER — Other Ambulatory Visit: Payer: Self-pay

## 2021-02-16 VITALS — BP 144/85 | HR 88 | Ht 78.0 in | Wt 284.6 lb

## 2021-02-16 DIAGNOSIS — E782 Mixed hyperlipidemia: Secondary | ICD-10-CM

## 2021-02-16 DIAGNOSIS — E1159 Type 2 diabetes mellitus with other circulatory complications: Secondary | ICD-10-CM | POA: Diagnosis not present

## 2021-02-16 DIAGNOSIS — I10 Essential (primary) hypertension: Secondary | ICD-10-CM

## 2021-02-16 MED ORDER — INSULIN LISPRO PROT & LISPRO (75-25 MIX) 100 UNIT/ML KWIKPEN: 80.0000 [IU] | PEN_INJECTOR | Freq: Two times a day (BID) | SUBCUTANEOUS | 2 refills | Status: DC

## 2021-02-16 NOTE — Patient Instructions (Signed)

## 2021-02-16 NOTE — Progress Notes (Signed)
02/16/2021, 3:36 PM  Endocrinology follow-up note   Subjective:    Patient ID: Devin [name], male    DOB: 09-Oct-1970.  Devin [name] is being seen in follow-up after he was seen in consultation for management of currently uncontrolled symptomatic diabetes requested by  Applewood Nation, MD.    Past Medical History:  Diagnosis Date   Asthma    Chest pain    CHF (congestive heart failure) ([place])    Coronary artery disease    a. s/p DES x2 to LAD and DES to OM in 11/2017   Depression    Dyspnea    GERD (gastroesophageal reflux disease)    Headache    History of kidney stones    History of rhabdomyolysis    Hyperlipidemia    Hypertension    Hypothyroidism    Ischemic cardiomyopathy    a. 11/2017: echo showing EF of 35-40%, diffuse HK, and Grade 2 DD   Kidney calculus 2014   Pericardial effusion    Small, by dobutamine echocardiogram, 04/2006   PONV (postoperative nausea and vomiting)    Sleep apnea    does not wear cpap   Stroke ([place])    Tobacco abuse    Type 2 diabetes mellitus without complications ([place]) 3/64/6803   Vitamin D deficiency     Past Surgical History:  Procedure Laterality Date   APPENDECTOMY     CARDIAC CATHETERIZATION  09/2012   "Nonobstructive CAD with 30% proximal, 40% mid LAD disease; 30% proximal CFX; EF 55-65%"   CHOLECYSTECTOMY N/A 01/27/2013   Procedure: LAPAROSCOPIC CHOLECYSTECTOMY;  Surgeon: Jamesetta So, MD;  Location: [facility];  Service: General;  Laterality: N/A;   CORONARY STENT INTERVENTION N/A 11/15/2017   Procedure: CORONARY STENT INTERVENTION;  Surgeon: Leonie Man, MD;  Location: [facility];  Service: Cardiovascular;  Laterality: N/A;   HERNIA REPAIR     As a child   INSERTION OF MESH N/A 11/13/2012   Procedure: INSERTION OF MESH;  Surgeon: Jamesetta So, MD;  Location: [facility];  Service: General;  Laterality: N/A;   IR ANGIO INTRA EXTRACRAN SEL  COM CAROTID INNOMINATE BILAT MOD SED  12/01/2017   IR ANGIO INTRA EXTRACRAN SEL COM CAROTID INNOMINATE BILAT MOD SED  02/13/2018   IR ANGIO VERTEBRAL SEL SUBCLAVIAN INNOMINATE UNI L MOD SED  02/13/2018   IR ANGIO VERTEBRAL SEL VERTEBRAL BILAT MOD SED  12/01/2017   IR ANGIO VERTEBRAL SEL VERTEBRAL UNI R MOD SED  02/13/2018   IR INTRA CRAN STENT  12/03/2017   IR PTA INTRACRANIAL  02/25/2018   IR US GUIDE VASC ACCESS RIGHT  02/13/2018   KIDNEY STONE SURGERY     LEFT HEART CATH AND CORONARY ANGIOGRAPHY N/A 11/15/2017   Procedure: LEFT HEART CATH AND CORONARY ANGIOGRAPHY;  Surgeon: Leonie Man, MD;  Location: [facility];  Service: Cardiovascular;  Laterality: N/A;   PERCUTANEOUS NEPHROLITHOTRIPSY     RADIOLOGY WITH ANESTHESIA Left 12/03/2017   Procedure: Angioplasty with possible stenting of left VBJ;  Surgeon: Luanne Bras, MD;  Location: [facility];  Service: Radiology;  Laterality: Left;   RADIOLOGY WITH ANESTHESIA N/A 02/25/2018  Procedure: STENT PLACEMENT;  Surgeon: Luanne Bras, MD;  Location: [facility];  Service: Radiology;  Laterality: N/A;   UMBILICAL HERNIA REPAIR N/A 11/13/2012   Procedure: UMBILICAL HERNIORRHAPHY;  Surgeon: Jamesetta So, MD;  Location: [facility];  Service: General;  Laterality: N/A;   VARICOCELECTOMY      Social History   Socioeconomic History   Marital status: Divorced    Spouse name: Not on file   Number of children: Not on file   Years of education: Not on file   Highest education level: Not on file  Occupational History   Occupation: Disability     Comment:      Employer: [employer]  Tobacco Use   Smoking status: Every Day    Packs/day: 1.00    Years: 26.00    Pack years: 26.00    Types: Cigarettes    Start date: 03/13/1985   Smokeless tobacco: Former    Types: Snuff, Chew    Quit date: 03/14/1987  Vaping Use   Vaping Use: Former   Devices: quit in 2011  Substance and Sexual Activity   Alcohol use: No   Drug use: No   Sexual activity: Not  on file  Other Topics Concern   Not on file  Social History Narrative   Eats fast food daily   No exercise outside of work   Lives in [place] with his dog   MAINTENANCE TECHNICIAN   Caffeine use: tea/soda daily   Left handed    Social Determinants of Health   Financial Resource Strain: Not on file  Food Insecurity: Not on file  Transportation Needs: Not on file  Physical Activity: Not on file  Stress: Not on file  Social Connections: Not on file    Family History  Problem Relation Age of Onset   Coronary artery disease Father    Hypertension Father    Hyperlipidemia Father    Diabetes Father    Congestive Heart Failure Father 78   Arrhythmia Father        had an ICD   Diabetes Mother    Hypertension Mother    Hyperlipidemia Mother    Obesity Mother 58       died after bariatric surgery, liver failure and infection   Coronary artery disease Maternal Grandfather        both grandfathers and several uncles   Coronary artery disease Other    Diabetes Sister        both sisters   Stroke Maternal Aunt    Stroke Maternal Uncle     Outpatient Encounter Medications as of 02/16/2021  Medication Sig   aspirin 81 MG EC tablet Take 1 tablet (81 mg total) by mouth daily. With food   Blood Glucose Monitoring Suppl (ACCU-CHEK GUIDE ME) w/Device KIT 1 Piece by Does not apply route as directed.   Blood Glucose Monitoring Suppl w/Device KIT Dispense based on patient and insurance preference. Check blood sugar daily before breakfast (ICD9 250.0)   buPROPion (WELLBUTRIN SR) 150 MG 12 hr tablet Take 150 mg by mouth 2 (two) times daily.   Cholecalciferol (VITAMIN D3) 50 MCG (2000 UT) TABS Take 1 tablet by mouth daily.   clopidogrel (PLAVIX) 75 MG tablet Take 1 tablet (75 mg total) by mouth daily.   Continuous Blood Gluc Receiver (FREESTYLE LIBRE 2 READER) DEVI As directed   Continuous Blood Gluc Sensor (FREESTYLE LIBRE 2 SENSOR) MISC 1 Piece by Does not apply route every 14 (fourteen) days.    DULERA  200-5 MCG/ACT AERO Inhale 2 puffs into the lungs 2 (two) times daily.   EUTHYROX 200 MCG tablet Take 200 mcg by mouth at bedtime.   Evolocumab (REPATHA SURECLICK) 778 MG/ML SOAJ Inject 1 pen into the skin every 14 (fourteen) days.   ezetimibe (ZETIA) 10 MG tablet Take 1 tablet (10 mg total) by mouth daily.   famotidine (PEPCID) 40 MG tablet Take 40 mg by mouth daily.   fenofibrate (TRICOR) 145 MG tablet Take 1 tablet (145 mg total) by mouth daily.   fluticasone (FLONASE) 50 MCG/ACT nasal spray Place 2 sprays into both nostrils 2 (two) times daily.   furosemide (LASIX) 20 MG tablet TAKE 1 TABLET BY MOUTH AS NEEDED FOR  SWELLING   gabapentin (NEURONTIN) 300 MG capsule One po q AM, one po q PM and 2 po qHS   gemfibrozil (LOPID) 600 MG tablet Take 600 mg by mouth 2 (two) times daily.   glipiZIDE (GLUCOTROL XL) 10 MG 24 hr tablet Take 10 mg by mouth daily.   glucose blood (ACCU-CHEK GUIDE) test strip Test glucose 2 times a day   Insulin Lispro Prot & Lispro (HUMALOG MIX 75/25 KWIKPEN) (75-25) 100 UNIT/ML Kwikpen Inject 60 Units into the skin 2 (two) times daily before a meal.   Insulin Pen Needle (B-D ULTRAFINE III SHORT PEN) 31G X 8 MM MISC 1 each by Does not apply route as directed.   levothyroxine (SYNTHROID) 88 MCG tablet Take 88 mcg by mouth daily.   nitroGLYCERIN (NITROSTAT) 0.4 MG SL tablet Place 1 tablet (0.4 mg total) under the tongue every 5 (five) minutes as needed for chest pain.   pantoprazole (PROTONIX) 40 MG tablet TAKE 1 TABLET BY MOUTH ONCE DAILY - NEED TO SCHEDULE AN APPOINTMENT   sildenafil (VIAGRA) 100 MG tablet TAKE 1/4 TABLET BY MOUTH 30 MINUTES BEFORE SEXUAL ACTIVITY IF 1/4 DOESN T WORK YOU MAY INCREASE TO 1/2 TABLET MAY INCREASE BY 1/4 TABLET DAILY UP TO 1 TABLET. DO NOT TAKE ANY SUBLINGUAL NITROGLYCERIN WHILE TAKING THIS MEDICATION   sitaGLIPtin (JANUVIA) 100 MG tablet Take 1 tablet (100 mg total) by mouth daily.   VASCEPA 1 g capsule Take 1 capsule (1 g total) by  mouth 2 (two) times daily.   VENTOLIN HFA 108 (90 Base) MCG/ACT inhaler Inhale into the lungs.   [DISCONTINUED] escitalopram (LEXAPRO) 10 MG tablet Take 10 mg by mouth daily. (Patient not taking: Reported on 01/31/2021)   [DISCONTINUED] glipiZIDE (GLUCOTROL XL) 5 MG 24 hr tablet Take 5 mg by mouth daily with breakfast. (Patient not taking: Reported on 02/16/2021)   No facility-administered encounter medications on file as of 02/16/2021.    ALLERGIES: Allergies  Allergen Reactions   Crestor [Rosuvastatin] Other (See Comments)    rhabdomyolosis   Trulicity [Dulaglutide] Nausea And Vomiting   Bee Venom Swelling    SWELLING REACTION UNSPECIFIED     VACCINATION STATUS: Immunization History  Administered Date(s) Administered   Tdap 12/12/2015    Diabetes He presents for his follow-up diabetic visit. He has type 2 diabetes mellitus. Onset time: He was diagnosed at approximate age of 5 years. His disease course has been stable. Hypoglycemia symptoms include nervousness/anxiousness, sweats and tremors. Pertinent negatives for hypoglycemia include no confusion, headaches, pallor or seizures. Associated symptoms include fatigue, foot paresthesias, polydipsia and polyuria. Pertinent negatives for diabetes include no chest pain, no polyphagia and no weakness. There are no hypoglycemic complications. Symptoms are stable. Diabetic complications include a CVA, heart disease, nephropathy and PVD. Risk factors for  coronary artery disease include diabetes mellitus, dyslipidemia, family history, obesity, male sex, tobacco exposure, sedentary lifestyle and hypertension. Current diabetic treatment includes insulin injections and oral agent (dual therapy). He is compliant with treatment most of the time. His weight is decreasing steadily. He is following a generally unhealthy diet. When asked about meal planning, he reported none. He has had a previous visit with a dietitian. He never participates in exercise. His  home blood glucose trend is fluctuating minimally. His breakfast blood glucose range is generally >200 mg/dl. His lunch blood glucose range is generally >200 mg/dl. His dinner blood glucose range is generally >200 mg/dl. His bedtime blood glucose range is generally >200 mg/dl. His overall blood glucose range is >200 mg/dl. (He presents today with his CGM and logs showing widely fluctuating glycemic pattern.  He has been taking an extra dose of 75/25 at lunch time due to high readings.  He has had some symptoms of hypoglycemia associated with a reading in the 80s.  He typically skips breakfast, eats Subway sub and chips for lunch, and dinner is typically his largest meal of the day.  He denies drinking sugary beverages.  Analysis of his CGM shows TIR 18%, TAR 82%, TBR 0%.) An ACE inhibitor/angiotensin II receptor blocker is not being taken. He does not see a podiatrist.Eye exam is current.  Hyperlipidemia This is a chronic problem. The current episode started more than 1 year ago. The problem is uncontrolled. Recent lipid tests were reviewed and are high. Exacerbating diseases include chronic renal disease, diabetes, hypothyroidism and obesity. Factors aggravating his hyperlipidemia include fatty foods and smoking. Associated symptoms include myalgias. Pertinent negatives include no chest pain or shortness of breath. Current antihyperlipidemic treatment includes ezetimibe. Risk factors for coronary artery disease include dyslipidemia, diabetes mellitus, hypertension, male sex, family history, obesity and a sedentary lifestyle.  Hypertension This is a chronic problem. The current episode started more than 1 year ago. The problem has been waxing and waning since onset. The problem is controlled. Associated symptoms include sweats. Pertinent negatives include no chest pain, headaches, neck pain, palpitations or shortness of breath. Agents associated with hypertension include thyroid hormones. Risk factors for  coronary artery disease include diabetes mellitus, dyslipidemia, family history, obesity, male gender, sedentary lifestyle and smoking/tobacco exposure. Past treatments include diuretics. The current treatment provides mild improvement. Compliance problems include diet and exercise.  Hypertensive end-organ damage includes kidney disease, CAD/MI, CVA, heart failure and PVD. Identifiable causes of hypertension include chronic renal disease and a thyroid problem.  Thyroid Problem Presents for follow-up visit. Onset time: He has taken levothyroxine for at least 2 years. Symptoms include anxiety, fatigue and tremors. Patient reports no constipation, diarrhea or palpitations. The symptoms have been stable. Past treatments include levothyroxine. His past medical history is significant for diabetes, heart failure and hyperlipidemia. Risk factors include family history of goiter.    Review of systems  Constitutional: + Minimally fluctuating body weight,  current Body mass index is 32.89 kg/m. , + fatigue, no subjective hyperthermia, no subjective hypothermia Eyes: no blurry vision, no xerophthalmia ENT: no sore throat, no nodules palpated in throat, no dysphagia/odynophagia, no hoarseness Cardiovascular: no chest pain, no shortness of breath, no palpitations, no leg swelling Respiratory: no cough, no shortness of breath Gastrointestinal: no nausea/vomiting/diarrhea Musculoskeletal: no muscle/joint aches Skin: no rashes, no hyperemia Neurological: no tremors, no numbness, no tingling, no dizziness Psychiatric: no depression, no anxiety  Objective:    Blood pressure (!) 144/85, pulse 88, height 6' 6"  (1.981  m), weight 284 lb 9.6 oz (129.1 kg).  BP Readings from Last 3 Encounters:  02/16/21 (!) 144/85  01/31/21 (!) 153/97  11/25/20 118/82    BP (!) 144/85   Pulse 88   Ht 6' 6"  (1.981 m)   Wt 284 lb 9.6 oz (129.1 kg)   BMI 32.89 kg/m   Wt Readings from Last 3 Encounters:  02/16/21 284 lb 9.6  oz (129.1 kg)  01/31/21 279 lb 3.2 oz (126.6 kg)  11/25/20 291 lb 3.2 oz (132.1 kg)     Physical Exam- Limited  Constitutional:  Body mass index is 32.89 kg/m. , not in acute distress, normal state of mind Eyes:  EOMI, no exophthalmos Neck: Supple Cardiovascular: RRR, no murmurs, rubs, or gallops, no edema Respiratory: Adequate breathing efforts, no crackles, rales, rhonchi, or wheezing Musculoskeletal: no gross deformities, strength intact in all four extremities, no gross restriction of joint movements Skin:  no rashes, no hyperemia Neurological: no tremor with outstretched hands   CMP ( most recent) CMP     Component Value Date/Time   NA 131 (L) 10/15/2019 0046   K 3.7 10/15/2019 0046   CL 93 (L) 10/15/2019 0046   CO2 26 10/15/2019 0046   GLUCOSE 380 (H) 10/15/2019 0046   BUN 12 09/06/2020 0000   CREATININE 1.2 09/06/2020 0000   CREATININE 1.10 10/15/2019 0728   CALCIUM 10.1 10/15/2019 0046   PROT 7.0 12/23/2019 1512   ALBUMIN 4.4 12/23/2019 1512   AST 13 12/23/2019 1512   ALT 24 12/23/2019 1512   ALKPHOS 81 12/23/2019 1512   BILITOT <0.2 12/23/2019 1512   GFRNONAA >60 10/15/2019 0728   GFRAA >60 10/15/2019 0728     Diabetic Labs (most recent): Lab Results  Component Value Date   HGBA1C 10.2 (A) 01/31/2021   HGBA1C 10.5 09/06/2020   HGBA1C 10.7 (A) 08/10/2020     Lipid Panel ( most recent) Lipid Panel     Component Value Date/Time   CHOL 265 (A) 07/21/2020 0000   CHOL 235 (H) 12/23/2019 1512   TRIG 741 (A) 07/21/2020 0000   HDL 24 (A) 07/21/2020 0000   HDL 25 (L) 12/23/2019 1512   CHOLHDL 9.4 (H) 12/23/2019 1512   CHOLHDL NOT REPORTED DUE TO HIGH TRIGLYCERIDES 10/15/2019 0728   VLDL UNABLE TO CALCULATE IF TRIGLYCERIDE OVER 400 mg/dL 10/15/2019 0728   LDLCALC 109 07/21/2020 0000   LDLCALC 150 (H) 12/23/2019 1512   LDLDIRECT 143 (H) 12/23/2019 1511   LDLDIRECT UNABLE TO CALCULATE IF TRIGLYCERIDE IS >1293 mg/dL 10/15/2019 0728   LABVLDL 60 (H)  12/23/2019 1512      Lab Results  Component Value Date   TSH 12.90 (A) 07/21/2020   TSH 29.00 (A) 04/05/2020   TSH 49.10 (A) 02/18/2020   TSH 83.261 (H) 10/15/2019   TSH 60.657 (H) 01/05/2018   TSH 84.910 (H) 11/13/2017   TSH >100.000 (H) 10/06/2013   FREET4 <0.25 (L) 10/15/2019   FREET4 0.24 (L) 10/06/2013       Assessment & Plan:   1) DM type 2 causing vascular disease ([place])  - Lisa Roca has currently uncontrolled symptomatic type 2 DM since 50 years of age.  He presents today with his CGM and logs showing widely fluctuating glycemic pattern.  He has been taking an extra dose of 75/25 at lunch time due to high readings.  He has had some symptoms of hypoglycemia associated with a reading in the 80s.  He typically skips breakfast, eats Subway sub and  chips for lunch, and dinner is typically his largest meal of the day.  He denies drinking sugary beverages.  Analysis of his CGM shows TIR 18%, TAR 82%, TBR 0%.  - Recent labs reviewed.  - I had a long discussion with him about the progressive nature of diabetes and the pathology behind its complications.  -his diabetes is complicated by coronary artery disease, CVA, heart failure, peripheral artery disease, obesity/sedentary life, smoking and he remains at a high risk for more acute and chronic complications which include CAD, CVA, CKD, retinopathy, and neuropathy. These are all discussed in detail with him.  - Nutritional counseling repeated at each appointment due to patients tendency to fall back in to old habits.  - The patient admits there is a room for improvement in their diet and drink choices. -  Suggestion is made for the patient to avoid simple carbohydrates from their diet including Cakes, Sweet Desserts / Pastries, Ice Cream, Soda (diet and regular), Sweet Tea, Candies, Chips, Cookies, Sweet Pastries, Store Bought Juices, Alcohol in Excess of 1-2 drinks a day, Artificial Sweeteners, Coffee Creamer, and "Sugar-free"  Products. This will help patient to have stable blood glucose profile and potentially avoid unintended weight gain.   - I encouraged the patient to switch to unprocessed or minimally processed complex starch and increased protein intake (animal or plant source), fruits, and vegetables.   - Patient is advised to stick to a routine mealtimes to eat 3 meals a day and avoid unnecessary snacks (to snack only to correct hypoglycemia).  - he will be scheduled with Jearld Fenton, RDN, CDE for diabetes education.  - I have approached him with the following individualized plan to manage  his diabetes and patient agrees:   -He has not engaged in proper monitoring and self-care.  In light of his presentation with chronic, severe hyperglycemia, he will be continued on multiple daily injections of insulin in order for him to achieve control of diabetes to target.    -He could not follow instructions on basal/bolus insulin, will be continued on premixed insulin twice daily.   -He is advised to increase his Humalog 75/25 to 80 units SQ twice daily with breakfast and supper if glucose is above 90 and he is eating.  He can continue Januvia 100 mg po daily and Glipizide 10 mg XL daily with breakfast (increased by his PCP).  Patient is advised to monitor blood glucose 4 times a day, using his CGM and to notify the clinic if he has readings less than 70 or above 300 for 3 tests in a row.  - he is warned not to take insulin without proper monitoring per orders.  - Specific targets for  A1c;  LDL, HDL,  and Triglycerides were discussed with the patient.  2) Blood Pressure /Hypertension:   -His blood pressure is not controlled to target.  he is advised to continue his current medications including Lasix 20 mg p.o. daily with breakfast .  3) Lipids/Hyperlipidemia:    His most recent lipid panel from 07/21/20 shows uncontrolled LDL of 109 and elevated triglycerides of 741.  He does not tolerate statins.  Patient is  on Zetia 10 mg p.o. daily as well as fenofibrate 145 mg p.o. daily at bedtime.  Has responded to Repatha, currently 140 mg of therapy every 14 days.  -  I discussed with the patient the fact that diabetes management with insulin will also help with hypertriglyceridemia.  4)  Weight/Diet:  His Body mass index  is 32.89 kg/m.  -   clearly complicating his diabetes care.   he is  a candidate for weight loss. I discussed with him the fact that loss of 5 - 10% of his  current body weight will have the most impact on his diabetes management.  Exercise, and detailed carbohydrates information provided  -  detailed on discharge instructions.  5) Hypothyroidism She has questionable compliance.  The circumstance of his diagnosis are not available to review.  He is currently on Euthyrox 250 mcg p.o. daily before breakfast.    - We discussed about the correct intake of his thyroid hormone, on empty stomach at fasting, with water, separated by at least 30 minutes from breakfast and other medications,  and separated by more than 4 hours from calcium, iron, multivitamins, acid reflux medications (PPIs). -Patient is made aware of the fact that thyroid hormone replacement is needed for life, dose to be adjusted by periodic monitoring of thyroid function tests.  6) vitamin D deficiency His last vitamin D level from 07/21/20 was 11.3.  He is advised to continue Ergocalciferol 50,000 units weekly.  7) Chronic Care/Health Maintenance: -he is on ARB and did not tolerate statin medications and  is encouraged to initiate and continue to follow up with Ophthalmology, Dentist,  Podiatrist at least yearly or according to recommendations, and advised to  quit smoking. I have recommended yearly flu vaccine and pneumonia vaccine at least every 5 years; moderate intensity exercise for up to 150 minutes weekly; and  sleep for at least 7 hours a day.   - he is advised to maintain close follow up with Scofield Nation, MD for  primary care needs, as well as his other providers for optimal and coordinated care.     I spent 40 minutes in the care of the patient today including review of labs from [place], Lipids, Thyroid Function, Hematology (current and previous including abstractions from other facilities); face-to-face time discussing  his blood glucose readings/logs, discussing hypoglycemia and hyperglycemia episodes and symptoms, medications doses, his options of short and long term treatment based on the latest standards of care / guidelines;  discussion about incorporating lifestyle medicine;  and documenting the encounter.    Please refer to Patient Instructions for Blood Glucose Monitoring and Insulin/Medications Dosing Guide"  in media tab for additional information. Please  also refer to " Patient Self Inventory" in the Media  tab for reviewed elements of pertinent patient history.  Lisa Roca participated in the discussions, expressed understanding, and voiced agreement with the above plans.  All questions were answered to his satisfaction. he is encouraged to contact clinic should he have any questions or concerns prior to his return visit.   Follow up plan: - Return for Diabetes F/U, Bring meter and logs- with [facility], No previsit labs, Bring meter and logs.  Rayetta Pigg, [facility] [address] [place], [place] [address] Phone: [phone] Fax: [phone]  02/16/2021, 3:36 PM

## 2021-03-10 ENCOUNTER — Other Ambulatory Visit: Payer: Self-pay | Admitting: "Endocrinology

## 2021-03-10 ENCOUNTER — Other Ambulatory Visit: Payer: Self-pay | Admitting: Cardiology

## 2021-03-17 ENCOUNTER — Other Ambulatory Visit: Payer: Self-pay

## 2021-03-17 ENCOUNTER — Encounter: Payer: Self-pay | Admitting: "Endocrinology

## 2021-03-17 ENCOUNTER — Ambulatory Visit (INDEPENDENT_AMBULATORY_CARE_PROVIDER_SITE_OTHER): Payer: Medicare Other | Admitting: "Endocrinology

## 2021-03-17 VITALS — BP 132/80 | HR 76 | Ht 78.0 in | Wt 279.6 lb

## 2021-03-17 DIAGNOSIS — I10 Essential (primary) hypertension: Secondary | ICD-10-CM | POA: Diagnosis not present

## 2021-03-17 DIAGNOSIS — E782 Mixed hyperlipidemia: Secondary | ICD-10-CM

## 2021-03-17 DIAGNOSIS — E559 Vitamin D deficiency, unspecified: Secondary | ICD-10-CM | POA: Diagnosis not present

## 2021-03-17 DIAGNOSIS — E1159 Type 2 diabetes mellitus with other circulatory complications: Secondary | ICD-10-CM

## 2021-03-17 DIAGNOSIS — E669 Obesity, unspecified: Secondary | ICD-10-CM | POA: Diagnosis not present

## 2021-03-17 DIAGNOSIS — E039 Hypothyroidism, unspecified: Secondary | ICD-10-CM

## 2021-03-17 DIAGNOSIS — Z6832 Body mass index (BMI) 32.0-32.9, adult: Secondary | ICD-10-CM

## 2021-03-17 MED ORDER — INSULIN LISPRO PROT & LISPRO (75-25 MIX) 100 UNIT/ML KWIKPEN: 100.0000 [IU] | PEN_INJECTOR | Freq: Two times a day (BID) | SUBCUTANEOUS | 2 refills | Status: DC

## 2021-03-17 NOTE — Progress Notes (Signed)
03/17/2021, 8:33 PM  Endocrinology follow-up note   Subjective:    Patient ID: Devin [name], male    DOB: 12-06-1970.  Devin [name] is being seen in follow-up after he was seen in consultation for management of currently uncontrolled symptomatic diabetes requested by  [place] Nation, MD.    Past Medical History:  Diagnosis Date   Asthma    Chest pain    CHF (congestive heart failure) ([place])    Coronary artery disease    a. s/p DES x2 to LAD and DES to OM in 11/2017   Depression    Dyspnea    GERD (gastroesophageal reflux disease)    Headache    History of kidney stones    History of rhabdomyolysis    Hyperlipidemia    Hypertension    Hypothyroidism    Ischemic cardiomyopathy    a. 11/2017: echo showing EF of 35-40%, diffuse HK, and Grade 2 DD   Kidney calculus 2014   Pericardial effusion    Small, by dobutamine echocardiogram, 04/2006   PONV (postoperative nausea and vomiting)    Sleep apnea    does not wear cpap   Stroke ([place])    Tobacco abuse    Type 2 diabetes mellitus without complications ([place]) 9/60/4540   Vitamin D deficiency     Past Surgical History:  Procedure Laterality Date   APPENDECTOMY     CARDIAC CATHETERIZATION  09/2012   "Nonobstructive CAD with 30% proximal, 40% mid LAD disease; 30% proximal CFX; EF 55-65%"   CHOLECYSTECTOMY N/A 01/27/2013   Procedure: LAPAROSCOPIC CHOLECYSTECTOMY;  Surgeon: Jamesetta So, MD;  Location: [facility];  Service: General;  Laterality: N/A;   CORONARY STENT INTERVENTION N/A 11/15/2017   Procedure: CORONARY STENT INTERVENTION;  Surgeon: Leonie Man, MD;  Location: [facility];  Service: Cardiovascular;  Laterality: N/A;   HERNIA REPAIR     As a child   INSERTION OF MESH N/A 11/13/2012   Procedure: INSERTION OF MESH;  Surgeon: Jamesetta So, MD;  Location: [facility];  Service: General;  Laterality: N/A;    IR ANGIO INTRA EXTRACRAN SEL COM CAROTID INNOMINATE BILAT MOD SED  12/01/2017   IR ANGIO INTRA EXTRACRAN SEL COM CAROTID INNOMINATE BILAT MOD SED  02/13/2018   IR ANGIO VERTEBRAL SEL SUBCLAVIAN INNOMINATE UNI L MOD SED  02/13/2018   IR ANGIO VERTEBRAL SEL VERTEBRAL BILAT MOD SED  12/01/2017   IR ANGIO VERTEBRAL SEL VERTEBRAL UNI R MOD SED  02/13/2018   IR INTRA CRAN STENT  12/03/2017   IR PTA INTRACRANIAL  02/25/2018   IR US GUIDE VASC ACCESS RIGHT  02/13/2018   KIDNEY STONE SURGERY     LEFT HEART CATH AND CORONARY ANGIOGRAPHY N/A 11/15/2017   Procedure: LEFT HEART CATH AND CORONARY ANGIOGRAPHY;  Surgeon: Leonie Man, MD;  Location: [facility];  Service: Cardiovascular;  Laterality: N/A;   PERCUTANEOUS NEPHROLITHOTRIPSY     RADIOLOGY WITH ANESTHESIA Left 12/03/2017   Procedure: Angioplasty with possible stenting of left VBJ;  Surgeon: Luanne Bras, MD;  Location: [facility];  Service: Radiology;  Laterality: Left;   RADIOLOGY WITH ANESTHESIA N/A 02/25/2018  Procedure: STENT PLACEMENT;  Surgeon: Luanne Bras, MD;  Location: [facility];  Service: Radiology;  Laterality: N/A;   UMBILICAL HERNIA REPAIR N/A 11/13/2012   Procedure: UMBILICAL HERNIORRHAPHY;  Surgeon: Jamesetta So, MD;  Location: [facility];  Service: General;  Laterality: N/A;   VARICOCELECTOMY      Social History   Socioeconomic History   Marital status: Divorced    Spouse name: Not on file   Number of children: Not on file   Years of education: Not on file   Highest education level: Not on file  Occupational History   Occupation: Disability     Comment:      Employer: [employer]  Tobacco Use   Smoking status: Every Day    Packs/day: 1.00    Years: 26.00    Pack years: 26.00    Types: Cigarettes    Start date: 03/13/1985   Smokeless tobacco: Former    Types: Snuff, Chew    Quit date: 03/14/1987  Vaping Use   Vaping Use: Former   Devices: quit in 2011  Substance and Sexual Activity    Alcohol use: No   Drug use: No   Sexual activity: Not on file  Other Topics Concern   Not on file  Social History Narrative   Eats fast food daily   No exercise outside of work   Lives in [place] with his dog   MAINTENANCE TECHNICIAN   Caffeine use: tea/soda daily   Left handed    Social Determinants of Health   Financial Resource Strain: Not on file  Food Insecurity: Not on file  Transportation Needs: Not on file  Physical Activity: Not on file  Stress: Not on file  Social Connections: Not on file    Family History  Problem Relation Age of Onset   Coronary artery disease Father    Hypertension Father    Hyperlipidemia Father    Diabetes Father    Congestive Heart Failure Father 13   Arrhythmia Father        had an ICD   Diabetes Mother    Hypertension Mother    Hyperlipidemia Mother    Obesity Mother 8       died after bariatric surgery, liver failure and infection   Coronary artery disease Maternal Grandfather        both grandfathers and several uncles   Coronary artery disease Other    Diabetes Sister        both sisters   Stroke Maternal Aunt    Stroke Maternal Uncle     Outpatient Encounter Medications as of 03/17/2021  Medication Sig   aspirin 81 MG EC tablet Take 1 tablet (81 mg total) by mouth daily. With food   clopidogrel (PLAVIX) 75 MG tablet Take 1 tablet (75 mg total) by mouth daily.   Continuous Blood Gluc Receiver (FREESTYLE LIBRE 2 READER) DEVI As directed   Continuous Blood Gluc Sensor (FREESTYLE LIBRE 2 SENSOR) MISC 1 Piece by Does not apply route every 14 (fourteen) days.   DULERA 200-5 MCG/ACT AERO Inhale 2 puffs into the lungs 2 (two) times daily.   EUTHYROX 200 MCG tablet Take 200 mcg by mouth at bedtime.   Evolocumab (REPATHA SURECLICK) 588 MG/ML SOAJ Inject 1 pen into the skin every 14 (fourteen) days.   ezetimibe (ZETIA) 10 MG tablet Take 1 tablet (10 mg total) by mouth daily.   famotidine (PEPCID) 40 MG tablet  Take 40 mg by mouth daily.   fenofibrate (TRICOR) 145  MG tablet Take 1 tablet (145 mg total) by mouth daily.   fluticasone (FLONASE) 50 MCG/ACT nasal spray Place 2 sprays into both nostrils 2 (two) times daily.   furosemide (LASIX) 20 MG tablet TAKE 1 TABLET BY MOUTH AS NEEDED FOR  SWELLING   gabapentin (NEURONTIN) 300 MG capsule One po q AM, one po q PM and 2 po qHS   gemfibrozil (LOPID) 600 MG tablet Take 600 mg by mouth 2 (two) times daily.   glipiZIDE (GLUCOTROL XL) 10 MG 24 hr tablet Take 10 mg by mouth daily.   glucose blood (ACCU-CHEK GUIDE) test strip Test glucose 2 times a day   Insulin Lispro Prot & Lispro (HUMALOG MIX 75/25 KWIKPEN) (75-25) 100 UNIT/ML Kwikpen Inject 100 Units into the skin 2 (two) times daily with a meal.   Insulin Pen Needle (B-D ULTRAFINE III SHORT PEN) 31G X 8 MM MISC 1 each by Does not apply route as directed.   levothyroxine (SYNTHROID) 88 MCG tablet Take 88 mcg by mouth daily.   nitroGLYCERIN (NITROSTAT) 0.4 MG SL tablet Place 1 tablet (0.4 mg total) under the tongue every 5 (five) minutes as needed for chest pain.   pantoprazole (PROTONIX) 40 MG tablet TAKE 1 TABLET BY MOUTH ONCE DAILY - NEED TO SCHEDULE AN APPOINTMENT   sildenafil (VIAGRA) 100 MG tablet TAKE 1/4 TABLET BY MOUTH 30 MINUTES BEFORE SEXUAL ACTIVITY IF 1/4 DOESN T WORK YOU MAY INCREASE TO 1/2 TABLET MAY INCREASE BY 1/4 TABLET DAILY UP TO 1 TABLET. DO NOT TAKE ANY SUBLINGUAL NITROGLYCERIN WHILE TAKING THIS MEDICATION   sitaGLIPtin (JANUVIA) 100 MG tablet Take 1 tablet (100 mg total) by mouth daily.   VASCEPA 1 g capsule Take 2 capsules (2 g total) by mouth 2 (two) times daily.   VENTOLIN HFA 108 (90 Base) MCG/ACT inhaler Inhale into the lungs.   [DISCONTINUED] Blood Glucose Monitoring Suppl (ACCU-CHEK GUIDE ME) w/Device KIT 1 Piece by Does not apply route as directed.   [DISCONTINUED] Blood Glucose Monitoring Suppl w/Device KIT Dispense based on patient and insurance preference.  Check blood sugar daily before breakfast (ICD9 250.0)   [DISCONTINUED] buPROPion (WELLBUTRIN SR) 150 MG 12 hr tablet Take 150 mg by mouth 2 (two) times daily.   [DISCONTINUED] Cholecalciferol (VITAMIN D3) 50 MCG (2000 UT) TABS Take 1 tablet by mouth daily.   [DISCONTINUED] Insulin Lispro Prot & Lispro (HUMALOG MIX 75/25 KWIKPEN) (75-25) 100 UNIT/ML Kwikpen Inject 80 Units into the skin 2 (two) times daily with a meal.   No facility-administered encounter medications on file as of 03/17/2021.    ALLERGIES: Allergies  Allergen Reactions   Crestor [Rosuvastatin] Other (See Comments)    rhabdomyolosis   Trulicity [Dulaglutide] Nausea And Vomiting   Bee Venom Swelling    SWELLING REACTION UNSPECIFIED     VACCINATION STATUS: Immunization History  Administered Date(s) Administered   Tdap 12/12/2015    Diabetes He presents for his follow-up diabetic visit. He has type 2 diabetes mellitus. Onset time: He was diagnosed at approximate age of 69 years. His disease course has been worsening. Hypoglycemia symptoms include nervousness/anxiousness, sweats and tremors. Pertinent negatives for hypoglycemia include no confusion, headaches, pallor or seizures. Associated symptoms include fatigue, foot paresthesias, polydipsia and polyuria. Pertinent negatives for diabetes include no chest pain, no polyphagia and no weakness. There are no hypoglycemic complications. Symptoms are worsening. Diabetic complications include a CVA, heart disease, nephropathy and PVD. Risk factors for coronary artery disease include diabetes mellitus, dyslipidemia, family history, obesity, male sex,  tobacco exposure, sedentary lifestyle and hypertension. Current diabetic treatment includes insulin injections and oral agent (dual therapy). He is compliant with treatment most of the time. His weight is decreasing steadily. He is following a generally unhealthy diet. When asked about meal planning, he reported none. He has had a  previous visit with a dietitian. He never participates in exercise. His home blood glucose trend is fluctuating minimally. His breakfast blood glucose range is generally >200 mg/dl. His lunch blood glucose range is generally >200 mg/dl. His dinner blood glucose range is generally >200 mg/dl. His bedtime blood glucose range is generally >200 mg/dl. His overall blood glucose range is >200 mg/dl. (He presents today with his CGM and logs showing 8 % TIR, 92widely fluctuating glycemic pattern.  He has been taking an extra dose of 75/25 at lunch time due to high readings.  He has had some symptoms of hypoglycemia associated with a reading in the 80s.  He typically skips breakfast, eats Subway sub and chips for lunch, and dinner is typically his largest meal of the day.  He denies drinking sugary beverages.  Analysis of his CGM shows TIR 18%, TAR 82%, TBR 0%.) An ACE inhibitor/angiotensin II receptor blocker is not being taken. He does not see a podiatrist.Eye exam is current.  Hyperlipidemia This is a chronic problem. The current episode started more than 1 year ago. The problem is uncontrolled. Recent lipid tests were reviewed and are high. Exacerbating diseases include chronic renal disease, diabetes, hypothyroidism and obesity. Factors aggravating his hyperlipidemia include fatty foods and smoking. Associated symptoms include myalgias. Pertinent negatives include no chest pain or shortness of breath. Current antihyperlipidemic treatment includes ezetimibe. Risk factors for coronary artery disease include dyslipidemia, diabetes mellitus, hypertension, male sex, family history, obesity and a sedentary lifestyle.  Hypertension This is a chronic problem. The current episode started more than 1 year ago. The problem has been waxing and waning since onset. The problem is controlled. Associated symptoms include sweats. Pertinent negatives include no chest pain, headaches, neck pain, palpitations or shortness of breath.  Agents associated with hypertension include thyroid hormones. Risk factors for coronary artery disease include diabetes mellitus, dyslipidemia, family history, obesity, male gender, sedentary lifestyle and smoking/tobacco exposure. Past treatments include diuretics. The current treatment provides mild improvement. Compliance problems include diet and exercise.  Hypertensive end-organ damage includes kidney disease, CAD/MI, CVA, heart failure and PVD. Identifiable causes of hypertension include chronic renal disease and a thyroid problem.  Thyroid Problem Presents for follow-up visit. Onset time: He has taken levothyroxine for at least 2 years. Symptoms include anxiety, fatigue and tremors. Patient reports no constipation, diarrhea or palpitations. The symptoms have been stable. Past treatments include levothyroxine. His past medical history is significant for diabetes, heart failure and hyperlipidemia. Risk factors include family history of goiter.    Review of systems  Constitutional: + Minimally fluctuating body weight,  current Body mass index is 32.31 kg/m. , + fatigue, no subjective hyperthermia, no subjective hypothermia Eyes: no blurry vision, no xerophthalmia ENT: no sore throat, no nodules palpated in throat, no dysphagia/odynophagia, no hoarseness Cardiovascular: no chest pain, no shortness of breath, no palpitations, no leg swelling Respiratory: no cough, no shortness of breath Gastrointestinal: no nausea/vomiting/diarrhea Musculoskeletal: no muscle/joint aches Skin: no rashes, no hyperemia Neurological: no tremors, no numbness, no tingling, no dizziness Psychiatric: no depression, no anxiety  Objective:    Blood pressure 132/80, pulse 76, height _0  (1.981 m), weight 279 lb 9.6 oz (126.8 kg).  BP  Readings from Last 3 Encounters:  03/17/21 132/80  02/16/21 (!) 144/85  01/31/21 (!) 153/97    BP 132/80    Pulse 76    Ht _0  (1.981 m)    Wt 279 lb 9.6 oz (126.8 kg)    BMI  32.31 kg/m   Wt Readings from Last 3 Encounters:  03/17/21 279 lb 9.6 oz (126.8 kg)  02/16/21 284 lb 9.6 oz (129.1 kg)  01/31/21 279 lb 3.2 oz (126.6 kg)     Physical Exam- Limited  Constitutional:  Body mass index is 32.31 kg/m. , not in acute distress, normal state of mind Eyes:  EOMI, no exophthalmos Neck: Supple Cardiovascular: RRR, no murmurs, rubs, or gallops, no edema Respiratory: Adequate breathing efforts, no crackles, rales, rhonchi, or wheezing Musculoskeletal: no gross deformities, strength intact in all four extremities, no gross restriction of joint movements Skin:  no rashes, no hyperemia Neurological: no tremor with outstretched hands   CMP ( most recent) CMP     Component Value Date/Time   NA 131 (L) 10/15/2019 0046   K 3.7 10/15/2019 0046   CL 93 (L) 10/15/2019 0046   CO2 26 10/15/2019 0046   GLUCOSE 380 (H) 10/15/2019 0046   BUN 12 09/06/2020 0000   CREATININE 1.2 09/06/2020 0000   CREATININE 1.10 10/15/2019 0728   CALCIUM 10.1 10/15/2019 0046   PROT 7.0 12/23/2019 1512   ALBUMIN 4.4 12/23/2019 1512   AST 13 12/23/2019 1512   ALT 24 12/23/2019 1512   ALKPHOS 81 12/23/2019 1512   BILITOT <0.2 12/23/2019 1512   GFRNONAA >60 10/15/2019 0728   GFRAA >60 10/15/2019 0728     Diabetic Labs (most recent): Lab Results  Component Value Date   HGBA1C 10.2 (A) 01/31/2021   HGBA1C 10.5 09/06/2020   HGBA1C 10.7 (A) 08/10/2020     Lipid Panel ( most recent) Lipid Panel     Component Value Date/Time   CHOL 265 (A) 07/21/2020 0000   CHOL 235 (H) 12/23/2019 1512   TRIG 741 (A) 07/21/2020 0000   HDL 24 (A) 07/21/2020 0000   HDL 25 (L) 12/23/2019 1512   CHOLHDL 9.4 (H) 12/23/2019 1512   CHOLHDL NOT REPORTED DUE TO HIGH TRIGLYCERIDES 10/15/2019 0728   VLDL UNABLE TO CALCULATE IF TRIGLYCERIDE OVER 400 mg/dL 10/15/2019 0728   LDLCALC 109 07/21/2020 0000   LDLCALC 150 (H) 12/23/2019 1512   LDLDIRECT 143 (H) 12/23/2019 1511   LDLDIRECT UNABLE TO CALCULATE  IF TRIGLYCERIDE IS >1293 mg/dL 10/15/2019 0728   LABVLDL 60 (H) 12/23/2019 1512      Lab Results  Component Value Date   TSH 12.90 (A) 07/21/2020   TSH 29.00 (A) 04/05/2020   TSH 49.10 (A) 02/18/2020   TSH 83.261 (H) 10/15/2019   TSH 60.657 (H) 01/05/2018   TSH 84.910 (H) 11/13/2017   TSH >100.000 (H) 10/06/2013   FREET4 <0.25 (L) 10/15/2019   FREET4 0.24 (L) 10/06/2013       Assessment & Plan:   1) DM type 2 causing vascular disease ([place])  - Lisa Roca has currently uncontrolled symptomatic type 2 DM since 51 years of age.  He presents today with his CGM and logs showing widely fluctuating glycemic pattern.  He has been taking an extra dose of 75/25 at lunch time due to high readings.  He has had some symptoms of hypoglycemia associated with a reading in the 80s.  He typically skips breakfast, eats Subway sub and chips for lunch, and dinner is typically  his largest meal of the day.  He denies drinking sugary beverages.  Analysis of his CGM shows TIR 18%, TAR 82%, TBR 0%.  - Recent labs reviewed.  - I had a long discussion with him about the progressive nature of diabetes and the pathology behind its complications.  -his diabetes is complicated by coronary artery disease, CVA, heart failure, peripheral artery disease, obesity/sedentary life, smoking and he remains at a high risk for more acute and chronic complications which include CAD, CVA, CKD, retinopathy, and neuropathy. These are all discussed in detail with him.  - Nutritional counseling repeated at each appointment due to patients tendency to fall back in to old habits.  - The patient admits there is a room for improvement in their diet and drink choices. -  Suggestion is made for the patient to avoid simple carbohydrates from their diet including Cakes, Sweet Desserts / Pastries, Ice Cream, Soda (diet and regular), Sweet Tea, Candies, Chips, Cookies, Sweet Pastries, Store Bought Juices, Alcohol in Excess of 1-2  drinks a day, Artificial Sweeteners, Coffee Creamer, and "Sugar-free" Products. This will help patient to have stable blood glucose profile and potentially avoid unintended weight gain.   - I encouraged the patient to switch to unprocessed or minimally processed complex starch and increased protein intake (animal or plant source), fruits, and vegetables.   - Patient is advised to stick to a routine mealtimes to eat 3 meals a day and avoid unnecessary snacks (to snack only to correct hypoglycemia).  - he will be scheduled with Jearld Fenton, RDN, CDE for diabetes education.  - I have approached him with the following individualized plan to manage  his diabetes and patient agrees:   -He has not engaged in proper monitoring and self-care.  In light of his presentation with chronic, severe hyperglycemia, he will be continued on multiple daily injections of insulin in order for him to achieve control of diabetes to target.    -He could not follow instructions on basal/bolus insulin, will be continued on premixed insulin twice daily.   -He is advised to increase his Humalog 75/25 to 80 units SQ twice daily with breakfast and supper if glucose is above 90 and he is eating.  He can continue Januvia 100 mg po daily and Glipizide 10 mg XL daily with breakfast (increased by his PCP).  Patient is advised to monitor blood glucose 4 times a day, using his CGM and to notify the clinic if he has readings less than 70 or above 300 for 3 tests in a row.  - he is warned not to take insulin without proper monitoring per orders.  - Specific targets for  A1c;  LDL, HDL,  and Triglycerides were discussed with the patient.  2) Blood Pressure /Hypertension:   -His blood pressure is not controlled to target.  he is advised to continue his current medications including Lasix 20 mg p.o. daily with breakfast .  3) Lipids/Hyperlipidemia:    His most recent lipid panel from 07/21/20 shows uncontrolled LDL of 109 and  elevated triglycerides of 741.  He does not tolerate statins.  Patient is on Zetia 10 mg p.o. daily as well as fenofibrate 145 mg p.o. daily at bedtime.  Has responded to Repatha, currently 140 mg of therapy every 14 days.  -  I discussed with the patient the fact that diabetes management with insulin will also help with hypertriglyceridemia.  4)  Weight/Diet:  His Body mass index is 32.31 kg/m.  -  clearly complicating his diabetes care.   he is  a candidate for weight loss. I discussed with him the fact that loss of 5 - 10% of his  current body weight will have the most impact on his diabetes management.  Exercise, and detailed carbohydrates information provided  -  detailed on discharge instructions.  5) Hypothyroidism She has questionable compliance.  The circumstance of his diagnosis are not available to review.  He is currently on Euthyrox 250 mcg p.o. daily before breakfast.    - We discussed about the correct intake of his thyroid hormone, on empty stomach at fasting, with water, separated by at least 30 minutes from breakfast and other medications,  and separated by more than 4 hours from calcium, iron, multivitamins, acid reflux medications (PPIs). -Patient is made aware of the fact that thyroid hormone replacement is needed for life, dose to be adjusted by periodic monitoring of thyroid function tests.  6) vitamin D deficiency His last vitamin D level from 07/21/20 was 11.3.  He is advised to continue Ergocalciferol 50,000 units weekly.  7) Chronic Care/Health Maintenance: -he is on ARB and did not tolerate statin medications and  is encouraged to initiate and continue to follow up with Ophthalmology, Dentist,  Podiatrist at least yearly or according to recommendations, and advised to  quit smoking. I have recommended yearly flu vaccine and pneumonia vaccine at least every 5 years; moderate intensity exercise for up to 150 minutes weekly; and  sleep for at least 7 hours a day.   - he  is advised to maintain close follow up with [place] Nation, MD for primary care needs, as well as his other providers for optimal and coordinated care.     I spent 40 minutes in the care of the patient today including review of labs from [facility], Lipids, Thyroid Function, Hematology (current and previous including abstractions from other facilities); face-to-face time discussing  his blood glucose readings/logs, discussing hypoglycemia and hyperglycemia episodes and symptoms, medications doses, his options of short and long term treatment based on the latest standards of care / guidelines;  discussion about incorporating lifestyle medicine;  and documenting the encounter.    Please refer to Patient Instructions for Blood Glucose Monitoring and Insulin/Medications Dosing Guide"  in media tab for additional information. Please  also refer to " Patient Self Inventory" in the Media  tab for reviewed elements of pertinent patient history.  Lisa Roca participated in the discussions, expressed understanding, and voiced agreement with the above plans.  All questions were answered to his satisfaction. he is encouraged to contact clinic should he have any questions or concerns prior to his return visit.   Follow up plan: - Return in about 3 months (around 06/15/2021) for F/U with Pre-visit Labs, Meter, Logs, A1c here.Rayetta Pigg, FNP-BC [facility] [address] [place], [place] [address] Phone: [phone] Fax: [phone]  03/17/2021, 8:33 PM

## 2021-03-17 NOTE — Patient Instructions (Signed)

## 2021-03-17 NOTE — Progress Notes (Deleted)
03/17/2021, 8:35 PM  Endocrinology follow-up note   Subjective:    Patient ID: Devin [name], male (51 years old)    DOB: [birth year]. (51 years)  Devin [name] is being seen in follow-up after he was seen in consultation for management of currently uncontrolled symptomatic diabetes requested by  Brady Nation, MD.    Past Medical History:  Diagnosis Date   Asthma    Chest pain    CHF (congestive heart failure) ([place])    Coronary artery disease    a. s/p DES x2 to LAD and DES to OM in 11/2017   Depression    Dyspnea    GERD (gastroesophageal reflux disease)    Headache    History of kidney stones    History of rhabdomyolysis    Hyperlipidemia    Hypertension    Hypothyroidism    Ischemic cardiomyopathy    a. 11/2017: echo showing EF of 35-40%, diffuse HK, and Grade 2 DD   Kidney calculus 2014   Pericardial effusion    Small, by dobutamine echocardiogram, 04/2006   PONV (postoperative nausea and vomiting)    Sleep apnea    does not wear cpap   Stroke ([place])    Tobacco abuse    Type 2 diabetes mellitus without complications ([place]) 06/03/5571   Vitamin D deficiency     Past Surgical History:  Procedure Laterality Date   APPENDECTOMY     CARDIAC CATHETERIZATION  09/2012   "Nonobstructive CAD with 30% proximal, 40% mid LAD disease; 30% proximal CFX; EF 55-65%"   CHOLECYSTECTOMY N/A 01/27/2013   Procedure: LAPAROSCOPIC CHOLECYSTECTOMY;  Surgeon: Jamesetta So, MD;  Location: [facility];  Service: General;  Laterality: N/A;   CORONARY STENT INTERVENTION N/A 11/15/2017   Procedure: CORONARY STENT INTERVENTION;  Surgeon: Leonie Man, MD;  Location: [facility];  Service: Cardiovascular;  Laterality: N/A;   HERNIA REPAIR     As a child   INSERTION OF MESH N/A 11/13/2012   Procedure: INSERTION OF MESH;  Surgeon: Jamesetta So, MD;  Location: [facility];  Service: General;  Laterality: N/A;   IR ANGIO INTRA EXTRACRAN SEL  COM CAROTID INNOMINATE BILAT MOD SED  12/01/2017   IR ANGIO INTRA EXTRACRAN SEL COM CAROTID INNOMINATE BILAT MOD SED  02/13/2018   IR ANGIO VERTEBRAL SEL SUBCLAVIAN INNOMINATE UNI L MOD SED  02/13/2018   IR ANGIO VERTEBRAL SEL VERTEBRAL BILAT MOD SED  12/01/2017   IR ANGIO VERTEBRAL SEL VERTEBRAL UNI R MOD SED  02/13/2018   IR INTRA CRAN STENT  12/03/2017   IR PTA INTRACRANIAL  02/25/2018   IR US GUIDE VASC ACCESS RIGHT  02/13/2018   KIDNEY STONE SURGERY     LEFT HEART CATH AND CORONARY ANGIOGRAPHY N/A 11/15/2017   Procedure: LEFT HEART CATH AND CORONARY ANGIOGRAPHY;  Surgeon: Leonie Man, MD;  Location: [facility];  Service: Cardiovascular;  Laterality: N/A;   PERCUTANEOUS NEPHROLITHOTRIPSY     RADIOLOGY WITH ANESTHESIA Left 12/03/2017   Procedure: Angioplasty with possible stenting of left VBJ;  Surgeon: Luanne Bras, MD;  Location: [facility];  Service: Radiology;  Laterality: Left;   RADIOLOGY WITH ANESTHESIA N/A 02/25/2018  Procedure: STENT PLACEMENT;  Surgeon: Luanne Bras, MD;  Location: [facility];  Service: Radiology;  Laterality: N/A;   UMBILICAL HERNIA REPAIR N/A 11/13/2012   Procedure: UMBILICAL HERNIORRHAPHY;  Surgeon: Jamesetta So, MD;  Location: [facility];  Service: General;  Laterality: N/A;   VARICOCELECTOMY      Social History   Socioeconomic History   Marital status: Divorced    Spouse name: Not on file   Number of children: Not on file   Years of education: Not on file   Highest education level: Not on file  Occupational History   Occupation: Disability     Comment:      Employer: [employer]  Tobacco Use   Smoking status: Every Day    Packs/day: 1.00    Years: 26.00    Pack years: 26.00    Types: Cigarettes    Start date: 03/13/1985   Smokeless tobacco: Former    Types: Snuff, Chew    Quit date: 03/14/1987  Vaping Use   Vaping Use: Former   Devices: quit in 2011  Substance and Sexual Activity   Alcohol use: No   Drug use: No   Sexual activity: Not  on file  Other Topics Concern   Not on file  Social History Narrative   Eats fast food daily   No exercise outside of work   Lives in [place] with his dog   MAINTENANCE TECHNICIAN   Caffeine use: tea/soda daily   Left handed    Social Determinants of Health   Financial Resource Strain: Not on file  Food Insecurity: Not on file  Transportation Needs: Not on file  Physical Activity: Not on file  Stress: Not on file  Social Connections: Not on file    Family History  Problem Relation Age of Onset   Coronary artery disease Father    Hypertension Father    Hyperlipidemia Father    Diabetes Father    Congestive Heart Failure Father 66   Arrhythmia Father        had an ICD   Diabetes Mother    Hypertension Mother    Hyperlipidemia Mother    Obesity Mother 81       died after bariatric surgery, liver failure and infection   Coronary artery disease Maternal Grandfather        both grandfathers and several uncles   Coronary artery disease Other    Diabetes Sister        both sisters   Stroke Maternal Aunt    Stroke Maternal Uncle     Outpatient Encounter Medications as of 03/17/2021  Medication Sig   aspirin 81 MG EC tablet Take 1 tablet (81 mg total) by mouth daily. With food   clopidogrel (PLAVIX) 75 MG tablet Take 1 tablet (75 mg total) by mouth daily.   Continuous Blood Gluc Receiver (FREESTYLE LIBRE 2 READER) DEVI As directed   Continuous Blood Gluc Sensor (FREESTYLE LIBRE 2 SENSOR) MISC 1 Piece by Does not apply route every 14 (fourteen) days.   DULERA 200-5 MCG/ACT AERO Inhale 2 puffs into the lungs 2 (two) times daily.   EUTHYROX 200 MCG tablet Take 200 mcg by mouth at bedtime.   Evolocumab (REPATHA SURECLICK) 244 MG/ML SOAJ Inject 1 pen into the skin every 14 (fourteen) days.   ezetimibe (ZETIA) 10 MG tablet Take 1 tablet (10 mg total) by mouth daily.   famotidine (PEPCID) 40 MG tablet Take 40 mg by mouth daily.   fenofibrate (TRICOR) 145 MG  tablet Take 1 tablet (145  mg total) by mouth daily.   fluticasone (FLONASE) 50 MCG/ACT nasal spray Place 2 sprays into both nostrils 2 (two) times daily.   furosemide (LASIX) 20 MG tablet TAKE 1 TABLET BY MOUTH AS NEEDED FOR  SWELLING   gabapentin (NEURONTIN) 300 MG capsule One po q AM, one po q PM and 2 po qHS   gemfibrozil (LOPID) 600 MG tablet Take 600 mg by mouth 2 (two) times daily.   glipiZIDE (GLUCOTROL XL) 10 MG 24 hr tablet Take 10 mg by mouth daily.   glucose blood (ACCU-CHEK GUIDE) test strip Test glucose 2 times a day   Insulin Lispro Prot & Lispro (HUMALOG MIX 75/25 KWIKPEN) (75-25) 100 UNIT/ML Kwikpen Inject 100 Units into the skin 2 (two) times daily with a meal.   Insulin Pen Needle (B-D ULTRAFINE III SHORT PEN) 31G X 8 MM MISC 1 each by Does not apply route as directed.   levothyroxine (SYNTHROID) 88 MCG tablet Take 88 mcg by mouth daily.   nitroGLYCERIN (NITROSTAT) 0.4 MG SL tablet Place 1 tablet (0.4 mg total) under the tongue every 5 (five) minutes as needed for chest pain.   pantoprazole (PROTONIX) 40 MG tablet TAKE 1 TABLET BY MOUTH ONCE DAILY - NEED TO SCHEDULE AN APPOINTMENT   sildenafil (VIAGRA) 100 MG tablet TAKE 1/4 TABLET BY MOUTH 30 MINUTES BEFORE SEXUAL ACTIVITY IF 1/4 DOESN T WORK YOU MAY INCREASE TO 1/2 TABLET MAY INCREASE BY 1/4 TABLET DAILY UP TO 1 TABLET. DO NOT TAKE ANY SUBLINGUAL NITROGLYCERIN WHILE TAKING THIS MEDICATION   sitaGLIPtin (JANUVIA) 100 MG tablet Take 1 tablet (100 mg total) by mouth daily.   VASCEPA 1 g capsule Take 2 capsules (2 g total) by mouth 2 (two) times daily.   VENTOLIN HFA 108 (90 Base) MCG/ACT inhaler Inhale into the lungs.   [DISCONTINUED] Blood Glucose Monitoring Suppl (ACCU-CHEK GUIDE ME) w/Device KIT 1 Piece by Does not apply route as directed.   [DISCONTINUED] Blood Glucose Monitoring Suppl w/Device KIT Dispense based on patient and insurance preference. Check blood sugar daily before breakfast (ICD9 250.0)   [DISCONTINUED] buPROPion (WELLBUTRIN SR) 150 MG  12 hr tablet Take 150 mg by mouth 2 (two) times daily.   [DISCONTINUED] Cholecalciferol (VITAMIN D3) 50 MCG (2000 UT) TABS Take 1 tablet by mouth daily.   [DISCONTINUED] Insulin Lispro Prot & Lispro (HUMALOG MIX 75/25 KWIKPEN) (75-25) 100 UNIT/ML Kwikpen Inject 80 Units into the skin 2 (two) times daily with a meal.   No facility-administered encounter medications on file as of 03/17/2021.    ALLERGIES: Allergies  Allergen Reactions   Crestor [Rosuvastatin] Other (See Comments)    rhabdomyolosis   Trulicity [Dulaglutide] Nausea And Vomiting   Bee Venom Swelling    SWELLING REACTION UNSPECIFIED     VACCINATION STATUS: Immunization History  Administered Date(s) Administered   Tdap 12/12/2015    Diabetes He presents for his follow-up diabetic visit. He has type 2 diabetes mellitus. Onset time: He was diagnosed at approximate age of 70 years. His disease course has been worsening. Hypoglycemia symptoms include nervousness/anxiousness, sweats and tremors. Pertinent negatives for hypoglycemia include no confusion, headaches, pallor or seizures. Associated symptoms include fatigue, foot paresthesias, polydipsia and polyuria. Pertinent negatives for diabetes include no chest pain, no polyphagia and no weakness. There are no hypoglycemic complications. Symptoms are worsening. Diabetic complications include a CVA, heart disease, nephropathy and PVD. Risk factors for coronary artery disease include diabetes mellitus, dyslipidemia, family history, obesity, male sex,  tobacco exposure, sedentary lifestyle and hypertension. Current diabetic treatment includes insulin injections and oral agent (dual therapy). He is compliant with treatment most of the time. His weight is decreasing steadily. He is following a generally unhealthy diet. When asked about meal planning, he reported none. He has had a previous visit with a dietitian. He never participates in exercise. His home blood glucose trend is fluctuating  minimally. His breakfast blood glucose range is generally >200 mg/dl. His lunch blood glucose range is generally >200 mg/dl. His dinner blood glucose range is generally >200 mg/dl. His bedtime blood glucose range is generally >200 mg/dl. His overall blood glucose range is >200 mg/dl. (He presents today with his CGM showing 8% TIR, 92% TAR, no hypoglycemia.POC a1c is 10.2%.) An ACE inhibitor/angiotensin II receptor blocker is not being taken. He does not see a podiatrist.Eye exam is current.  Hyperlipidemia This is a chronic problem. The current episode started more than 1 year ago. The problem is uncontrolled. Recent lipid tests were reviewed and are high. Exacerbating diseases include chronic renal disease, diabetes, hypothyroidism and obesity. Factors aggravating his hyperlipidemia include fatty foods and smoking. Associated symptoms include myalgias. Pertinent negatives include no chest pain or shortness of breath. Current antihyperlipidemic treatment includes ezetimibe. Risk factors for coronary artery disease include dyslipidemia, diabetes mellitus, hypertension, male sex, family history, obesity and a sedentary lifestyle.  Hypertension This is a chronic problem. The current episode started more than 1 year ago. The problem has been waxing and waning since onset. The problem is controlled. Associated symptoms include sweats. Pertinent negatives include no chest pain, headaches, neck pain, palpitations or shortness of breath. Agents associated with hypertension include thyroid hormones. Risk factors for coronary artery disease include diabetes mellitus, dyslipidemia, family history, obesity, male gender, sedentary lifestyle and smoking/tobacco exposure. Past treatments include diuretics. The current treatment provides mild improvement. Compliance problems include diet and exercise.  Hypertensive end-organ damage includes kidney disease, CAD/MI, CVA, heart failure and PVD. Identifiable causes of hypertension  include chronic renal disease and a thyroid problem.  Thyroid Problem Presents for follow-up visit. Onset time: He has taken levothyroxine for at least 2 years. Symptoms include anxiety, fatigue and tremors. Patient reports no constipation, diarrhea or palpitations. The symptoms have been stable. Past treatments include levothyroxine. His past medical history is significant for diabetes, heart failure and hyperlipidemia. Risk factors include family history of goiter.    Review of systems    Objective:    Blood pressure 132/80, pulse 76, height _0  (1.981 m), weight 279 lb 9.6 oz (126.8 kg).  BP Readings from Last 3 Encounters:  03/17/21 132/80  02/16/21 (!) 144/85  01/31/21 (!) 153/97    BP 132/80    Pulse 76    Ht _1  (1.981 m)    Wt 279 lb 9.6 oz (126.8 kg)    BMI 32.31 kg/m   Wt Readings from Last 3 Encounters:  03/17/21 279 lb 9.6 oz (126.8 kg)  02/16/21 284 lb 9.6 oz (129.1 kg)  01/31/21 279 lb 3.2 oz (126.6 kg)     Physical Exam- Limited  Constitutional:  Body mass index is 32.31 kg/m. , not in acute distress, normal state of mind    CMP ( most recent) CMP     Component Value Date/Time   NA 131 (L) 10/15/2019 0046   K 3.7 10/15/2019 0046   CL 93 (L) 10/15/2019 0046   CO2 26 10/15/2019 0046   GLUCOSE 380 (H) 10/15/2019 0046   BUN 12  09/06/2020 0000   CREATININE 1.2 09/06/2020 0000   CREATININE 1.10 10/15/2019 0728   CALCIUM 10.1 10/15/2019 0046   PROT 7.0 12/23/2019 1512   ALBUMIN 4.4 12/23/2019 1512   AST 13 12/23/2019 1512   ALT 24 12/23/2019 1512   ALKPHOS 81 12/23/2019 1512   BILITOT <0.2 12/23/2019 1512   GFRNONAA >60 10/15/2019 0728   GFRAA >60 10/15/2019 0728     Diabetic Labs (most recent): Lab Results  Component Value Date   HGBA1C 10.2 (A) 01/31/2021   HGBA1C 10.5 09/06/2020   HGBA1C 10.7 (A) 08/10/2020     Lipid Panel ( most recent) Lipid Panel     Component Value Date/Time   CHOL 265 (A) 07/21/2020 0000   CHOL 235 (H)  12/23/2019 1512   TRIG 741 (A) 07/21/2020 0000   HDL 24 (A) 07/21/2020 0000   HDL 25 (L) 12/23/2019 1512   CHOLHDL 9.4 (H) 12/23/2019 1512   CHOLHDL NOT REPORTED DUE TO HIGH TRIGLYCERIDES 10/15/2019 0728   VLDL UNABLE TO CALCULATE IF TRIGLYCERIDE OVER 400 mg/dL 10/15/2019 0728   LDLCALC 109 07/21/2020 0000   LDLCALC 150 (H) 12/23/2019 1512   LDLDIRECT 143 (H) 12/23/2019 1511   LDLDIRECT UNABLE TO CALCULATE IF TRIGLYCERIDE IS >1293 mg/dL 10/15/2019 0728   LABVLDL 60 (H) 12/23/2019 1512      Lab Results  Component Value Date   TSH 12.90 (A) 07/21/2020   TSH 29.00 (A) 04/05/2020   TSH 49.10 (A) 02/18/2020   TSH 83.261 (H) 10/15/2019   TSH 60.657 (H) 01/05/2018   TSH 84.910 (H) 11/13/2017   TSH >100.000 (H) 10/06/2013   FREET4 <0.25 (L) 10/15/2019   FREET4 0.24 (L) 10/06/2013       Assessment & Plan:   1) DM type 2 causing vascular disease ([place])  - Lisa Roca has currently uncontrolled symptomatic type 2 DM since 51 years of age.  He presents today with his CGM showing 8% TIR, 92% TAR, no hypoglycemia.POC a1c is 10.2%.  - Recent labs reviewed.  - I had a long discussion with him about the progressive nature of diabetes and the pathology behind its complications.  -his diabetes is complicated by coronary artery disease, CVA, heart failure, peripheral artery disease, obesity/sedentary life, smoking and he remains at a high risk for more acute and chronic complications which include CAD, CVA, CKD, retinopathy, and neuropathy. These are all discussed in detail with him.  - Nutritional counseling repeated at each appointment due to patients tendency to fall back in to old habits.  - he acknowledges that there is a room for improvement in his food and drink choices. - Suggestion is made for him to avoid simple carbohydrates  from his diet including Cakes, Sweet Desserts, Ice Cream, Soda (diet and regular), Sweet Tea, Candies, Chips, Cookies, Store Bought Juices, Alcohol ,  Artificial Sweeteners,  Coffee Creamer, and "Sugar-free" Products, Lemonade. This will help patient to have more stable blood glucose profile and potentially avoid unintended weight gain.  The following Lifestyle Medicine recommendations according to [facility]) were discussed and and offered to patient and he  agrees to start the journey:  A. Whole Foods, Plant-Based Nutrition comprising of fruits and vegetables, plant-based proteins, whole-grain carbohydrates was discussed in detail with the patient.   A list for source of those nutrients were also provided to the patient.  Patient will use only water or unsweetened tea for hydration. B.  The need to stay away from risky  substances including alcohol, smoking; obtaining 7 to 9 hours of restorative sleep, at least 150 minutes of moderate intensity exercise weekly, the importance of healthy social connections,  and stress management techniques were discussed.      - I encouraged the patient to switch to unprocessed or minimally processed complex starch and increased protein intake (animal or plant source), fruits, and vegetables.   - Patient is advised to stick to a routine mealtimes to eat 3 meals a day and avoid unnecessary snacks (to snack only to correct hypoglycemia).  - he will be scheduled with Jearld Fenton, RDN, CDE for diabetes education.  - I have approached him with the following individualized plan to manage  his diabetes and patient agrees:   -He is benefiting from the CGM, even though he is not utilizing optimally. -He could not follow instructions on basal/bolus insulin, will be continued on premixed insulin twice daily.   -He is advised to increase his Humalog 75/25 to 100 units SQ twice daily with breakfast and supper if glucose is above 90 and he is eating.  He can continue Januvia 100 mg po daily and Glipizide 10 mg XL daily with breakfast (increased by his PCP).  Patient is advised to  monitor blood glucose 4 times a day, using his CGM and to notify the clinic if he has readings less than 70 or above 300 for 3 tests in a row.  - he is warned not to take insulin without proper monitoring per orders.  - Specific targets for  A1c;  LDL, HDL,  and Triglycerides were discussed with the patient.  2) Blood Pressure /Hypertension:   -His blood pressure is not controlled to target.  he is advised to continue his current medications including Lasix 20 mg p.o. daily with breakfast .  3) Lipids/Hyperlipidemia:    His most recent lipid panel from 07/21/20 shows uncontrolled LDL of 109 and elevated triglycerides of 741.  He does not tolerate statins.  Patient is on Zetia 10 mg p.o. daily as well as fenofibrate 145 mg p.o. daily at bedtime.  Has responded to Repatha, currently 140 mg of therapy every 14 days.  -  I discussed with the patient the fact that diabetes management with insulin will also help with hypertriglyceridemia.  4)  Weight/Diet:  His Body mass index is 32.31 kg/m.  -   clearly complicating his diabetes care.   he is  a candidate for weight loss. I discussed with him the fact that loss of 5 - 10% of his  current body weight will have the most impact on his diabetes management.  Exercise, and detailed carbohydrates information provided  -  detailed on discharge instructions.  5) Hypothyroidism She has questionable compliance.  The circumstance of his diagnosis are not available to review.  He is currently on Euthyrox 250 mcg p.o. daily before breakfast.    - We discussed about the correct intake of his thyroid hormone, on empty stomach at fasting, with water, separated by at least 30 minutes from breakfast and other medications,  and separated by more than 4 hours from calcium, iron, multivitamins, acid reflux medications (PPIs). -Patient is made aware of the fact that thyroid hormone replacement is needed for life, dose to be adjusted by periodic monitoring of thyroid  function tests.   6) vitamin D deficiency His last vitamin D level from 07/21/20 was 11.3.  He is advised to continue Ergocalciferol 50,000 units weekly.  7) Chronic Care/Health Maintenance: -he is on ARB  and did not tolerate statin medications and  is encouraged to initiate and continue to follow up with Ophthalmology, Dentist,  Podiatrist at least yearly or according to recommendations, and advised to  quit smoking. I have recommended yearly flu vaccine and pneumonia vaccine at least every 5 years; moderate intensity exercise for up to 150 minutes weekly; and  sleep for at least 7 hours a day.   - he is advised to maintain close follow up with Powhatan Nation, MD for primary care needs, as well as his other providers for optimal and coordinated care.     I spent 41 minutes in the care of the patient today including review of labs from [facility], Lipids, Thyroid Function, Hematology (current and previous including abstractions from other facilities); face-to-face time discussing  his blood glucose readings/logs, discussing hypoglycemia and hyperglycemia episodes and symptoms, medications doses, his options of short and long term treatment based on the latest standards of care / guidelines;  discussion about incorporating lifestyle medicine;  and documenting the encounter.    Please refer to Patient Instructions for Blood Glucose Monitoring and Insulin/Medications Dosing Guide"  in media tab for additional information. Please  also refer to " Patient Self Inventory" in the Media  tab for reviewed elements of pertinent patient history.  Lisa Roca participated in the discussions, expressed understanding, and voiced agreement with the above plans.  All questions were answered to his satisfaction. he is encouraged to contact clinic should he have any questions or concerns prior to his return visit.  Follow up plan: - Return in about 3 months (around 06/15/2021) for F/U with Pre-visit Labs, Meter,  Logs, A1c here.Rayetta Pigg, FNP-BC [facility] [address] [place], [place] [address] Phone: [phone] Fax: [phone]  03/17/2021, 8:35 PM

## 2021-03-26 ENCOUNTER — Other Ambulatory Visit: Payer: Self-pay

## 2021-03-26 ENCOUNTER — Emergency Department (HOSPITAL_COMMUNITY): Payer: Medicare Other

## 2021-03-26 ENCOUNTER — Encounter (HOSPITAL_COMMUNITY): Payer: Self-pay

## 2021-03-26 ENCOUNTER — Emergency Department (HOSPITAL_COMMUNITY)
Admission: EM | Admit: 2021-03-26 | Discharge: 2021-03-26 | Disposition: A | Discharge status: Home / Self Care | Payer: Medicare Other | Attending: Emergency Medicine | Admitting: Emergency Medicine

## 2021-03-26 DIAGNOSIS — R079 Chest pain, unspecified: Secondary | ICD-10-CM | POA: Insufficient documentation

## 2021-03-26 DIAGNOSIS — Z20822 Contact with and (suspected) exposure to covid-19: Secondary | ICD-10-CM | POA: Diagnosis not present

## 2021-03-26 DIAGNOSIS — Z79899 Other long term (current) drug therapy: Secondary | ICD-10-CM | POA: Diagnosis not present

## 2021-03-26 DIAGNOSIS — N289 Disorder of kidney and ureter, unspecified: Secondary | ICD-10-CM | POA: Diagnosis not present

## 2021-03-26 DIAGNOSIS — E039 Hypothyroidism, unspecified: Secondary | ICD-10-CM | POA: Insufficient documentation

## 2021-03-26 DIAGNOSIS — E119 Type 2 diabetes mellitus without complications: Secondary | ICD-10-CM | POA: Insufficient documentation

## 2021-03-26 DIAGNOSIS — Z7982 Long term (current) use of aspirin: Secondary | ICD-10-CM | POA: Insufficient documentation

## 2021-03-26 DIAGNOSIS — F17210 Nicotine dependence, cigarettes, uncomplicated: Secondary | ICD-10-CM | POA: Diagnosis not present

## 2021-03-26 DIAGNOSIS — I10 Essential (primary) hypertension: Secondary | ICD-10-CM | POA: Insufficient documentation

## 2021-03-26 DIAGNOSIS — Z794 Long term (current) use of insulin: Secondary | ICD-10-CM | POA: Diagnosis not present

## 2021-03-26 LAB — CBC
HCT: 45.4 % (ref 39.0–52.0)
Hemoglobin: 14.8 g/dL (ref 13.0–17.0)
MCH: 28.6 pg (ref 26.0–34.0)
MCHC: 32.6 g/dL (ref 30.0–36.0)
MCV: 87.8 fL (ref 80.0–100.0)
Platelets: 289 10*3/uL (ref 150–400)
RBC: 5.17 MIL/uL (ref 4.22–5.81)
RDW: 14.2 % (ref 11.5–15.5)
WBC: 8.4 10*3/uL (ref 4.0–10.5)
nRBC: 0 % (ref 0.0–0.2)

## 2021-03-26 LAB — BASIC METABOLIC PANEL
Anion gap: 8 (ref 5–15)
BUN: 21 mg/dL — ABNORMAL HIGH (ref 6–20)
CO2: 26 mmol/L (ref 22–32)
Calcium: 9.8 mg/dL (ref 8.9–10.3)
Chloride: 103 mmol/L (ref 98–111)
Creatinine, Ser: 1.82 mg/dL — ABNORMAL HIGH (ref 0.61–1.24)
GFR, Estimated: 45 mL/min — ABNORMAL LOW (ref >60)
Glucose, Bld: 250 mg/dL — ABNORMAL HIGH (ref 70–99)
Potassium: 4.1 mmol/L (ref 3.5–5.1)
Sodium: 137 mmol/L (ref 135–145)

## 2021-03-26 LAB — RESP PANEL BY RT-PCR (FLU A&B, COVID) ARPGX2
Influenza A by PCR: NEGATIVE
Influenza B by PCR: NEGATIVE
SARS Coronavirus 2 by RT PCR: NEGATIVE

## 2021-03-26 LAB — TROPONIN I (HIGH SENSITIVITY)
Troponin I (High Sensitivity): 9 ng/L (ref <18)
Troponin I (High Sensitivity): 9 ng/L (ref <18)

## 2021-03-26 MED ORDER — ASPIRIN 81 MG PO CHEW
324.0000 mg | CHEWABLE_TABLET | Freq: Once | ORAL | Status: AC
  Administered 2021-03-26: 324 mg via ORAL
  Filled 2021-03-26: qty 4

## 2021-03-26 MED ORDER — SODIUM CHLORIDE 0.9 % IV BOLUS
1000.0000 mL | Freq: Once | INTRAVENOUS | Status: AC
  Administered 2021-03-26: 1000 mL via INTRAVENOUS

## 2021-03-26 NOTE — ED Triage Notes (Signed)
Pt arrives with c/o chest pains that started about 3 days ago. Per pt, the pain has gotten worse and is now radiating to his right arm and jaw. Pt states pain feels like pressure. Pt endorses n/v yesterday.

## 2021-03-26 NOTE — ED Provider Notes (Addendum)
[facility] EMERGENCY DEPARTMENT Provider Note   CSN: [id number] Arrival date & time: 03/26/21  2039     History  Chief Complaint  Patient presents with   Chest Pain    Devin [name] is a 51 y.o. male.   Chest Pain  This patient is a 51 year old male, he has a known history of hypothyroidism, he has a history of diabetes on insulin, he has hypertension, he smokes cigarettes down to half a pack a day from 2 packs a day and has high cholesterol.  He tells me that he has had multiple strokes, multiple coronary obstructive arteries which have been stented.  He follows with cardiology.  His last visit with cardiology was on November 25, 2020, he has known chronic systolic heart failure, he had a heart cath in 2019 in September, he had drug-eluting stents to the left anterior descending as well as the obtuse marginal.  He also follows closely with endocrinology and has been seen by his diabetic doctors recently.  He presents to the hospital stating that he has had pain in his chest for approximately 3 days, and seems to be sharp, radiating into his neck and his jaw and feeling some discomfort in his right ear.  He denies any shortness of breath but has had some increasing coughing recently, no fevers, no swelling of the legs, no abdominal pain, no nausea vomiting or diarrhea today.  He is having active chest pain at this time, and seems to get worse when he changes position or tries to sit up in the bed  Home Medications Prior to Admission medications   Medication Sig Start Date End Date Taking? Authorizing Provider  aspirin 81 MG EC tablet Take 1 tablet (81 mg total) by mouth daily. With food 12/05/17   Shon Hale, MD  clopidogrel (PLAVIX) 75 MG tablet Take 1 tablet (75 mg total) by mouth daily. 10/15/19   Cleora Fleet, MD  Continuous Blood Gluc Receiver (FREESTYLE LIBRE 2 READER) DEVI As directed 11/11/20   Roma Kayser, MD  Continuous Blood Gluc Sensor (FREESTYLE LIBRE  2 SENSOR) MISC 1 Piece by Does not apply route every 14 (fourteen) days. 11/11/20   Roma Kayser, MD  DULERA 200-5 MCG/ACT AERO Inhale 2 puffs into the lungs 2 (two) times daily. 10/15/19   Johnson, Clanford L, MD  EUTHYROX 200 MCG tablet Take 200 mcg by mouth at bedtime. 05/13/20   [provider]  Evolocumab (REPATHA SURECLICK) 140 MG/ML SOAJ Inject 1 pen into the skin every 14 (fourteen) days. 12/24/19   Antoine Poche, MD  ezetimibe (ZETIA) 10 MG tablet Take 1 tablet (10 mg total) by mouth daily. 10/15/19   Johnson, Clanford L, MD  famotidine (PEPCID) 40 MG tablet Take 40 mg by mouth daily. 11/23/20   [provider]  fenofibrate (TRICOR) 145 MG tablet Take 1 tablet (145 mg total) by mouth daily. 10/15/19   Johnson, Clanford L, MD  fluticasone (FLONASE) 50 MCG/ACT nasal spray Place 2 sprays into both nostrils 2 (two) times daily. 06/24/20   [provider]  furosemide (LASIX) 20 MG tablet TAKE 1 TABLET BY MOUTH AS NEEDED FOR  SWELLING 02/14/21   Antoine Poche, MD  gabapentin (NEURONTIN) 300 MG capsule One po q AM, one po q PM and 2 po qHS 12/23/19   Sater, Pearletha Furl, MD  gemfibrozil (LOPID) 600 MG tablet Take 600 mg by mouth 2 (two) times daily. 11/23/20   [provider]  glipiZIDE (  GLUCOTROL XL) 10 MG 24 hr tablet Take 10 mg by mouth daily. 02/16/21   [provider]  glucose blood (ACCU-CHEK GUIDE) test strip Test glucose 2 times a day 08/10/20   Roma Kayser, MD  Insulin Lispro Prot & Lispro (HUMALOG MIX 75/25 KWIKPEN) (75-25) 100 UNIT/ML Kwikpen Inject 100 Units into the skin 2 (two) times daily with a meal. 03/17/21   Nida, Denman George, MD  Insulin Pen Needle (B-D ULTRAFINE III SHORT PEN) 31G X 8 MM MISC 1 each by Does not apply route as directed. 12/19/19   Roma Kayser, MD  levothyroxine (SYNTHROID) 88 MCG tablet Take 88 mcg by mouth daily. 01/25/21   [provider]  nitroGLYCERIN (NITROSTAT) 0.4 MG SL tablet  Place 1 tablet (0.4 mg total) under the tongue every 5 (five) minutes as needed for chest pain. 02/14/21   Antoine Poche, MD  pantoprazole (PROTONIX) 40 MG tablet TAKE 1 TABLET BY MOUTH ONCE DAILY - NEED TO SCHEDULE AN APPOINTMENT 02/14/21   Antoine Poche, MD  sildenafil (VIAGRA) 100 MG tablet TAKE 1/4 TABLET BY MOUTH 30 MINUTES BEFORE SEXUAL ACTIVITY IF 1/4 DOESN T WORK YOU MAY INCREASE TO 1/2 TABLET MAY INCREASE BY 1/4 TABLET DAILY UP TO 1 TABLET. DO NOT TAKE ANY SUBLINGUAL NITROGLYCERIN WHILE TAKING THIS MEDICATION 12/24/20   [provider]  sitaGLIPtin (JANUVIA) 100 MG tablet Take 1 tablet (100 mg total) by mouth daily. 10/15/19   Johnson, Clanford L, MD  VASCEPA 1 g capsule Take 2 capsules (2 g total) by mouth 2 (two) times daily. 03/11/21   Antoine Poche, MD  VENTOLIN HFA 108 [phone] Base) MCG/ACT inhaler Inhale into the lungs. 11/23/20   [provider]      Allergies    Crestor [rosuvastatin], Trulicity [dulaglutide], and Bee venom    Review of Systems   Review of Systems  Cardiovascular:  Positive for chest pain.  All other systems reviewed and are negative.  Physical Exam Updated Vital Signs BP 122/67    Pulse 82    Temp 98 F (36.7 C)    Resp (!) 28    Wt 136.1 kg    SpO2 97%    BMI 34.67 kg/m  Physical Exam Vitals and nursing note reviewed.  Constitutional:      General: He is not in acute distress.    Appearance: He is well-developed.  HENT:     Head: Normocephalic and atraumatic.     Mouth/Throat:     Pharynx: No oropharyngeal exudate.  Eyes:     General: No scleral icterus.       Right eye: No discharge.        Left eye: No discharge.     Conjunctiva/sclera: Conjunctivae normal.     Pupils: Pupils are equal, round, and reactive to light.  Neck:     Thyroid: No thyromegaly.     Vascular: No JVD.  Cardiovascular:     Rate and Rhythm: Normal rate and regular rhythm.     Heart sounds: Normal heart sounds. No murmur heard.   No friction  rub. No gallop.  Pulmonary:     Effort: Pulmonary effort is normal. No respiratory distress.     Breath sounds: Normal breath sounds. No wheezing or rales.  Abdominal:     General: Bowel sounds are normal. There is no distension.     Palpations: Abdomen is soft. There is no mass.     Tenderness: There is no abdominal tenderness.  Musculoskeletal:        General: No tenderness. Normal range of motion.     Cervical back: Normal range of motion and neck supple.  Lymphadenopathy:     Cervical: No cervical adenopathy.  Skin:    General: Skin is warm and dry.     Findings: No erythema or rash.  Neurological:     Mental Status: He is alert.     Coordination: Coordination normal.  Psychiatric:        Behavior: Behavior normal.    ED Results / Procedures / Treatments   Labs (all labs ordered are listed, but only abnormal results are displayed) Labs Reviewed  BASIC METABOLIC PANEL - Abnormal; Notable for the following components:      Result Value   Glucose, Bld 250 (*)    BUN 21 (*)    Creatinine, Ser 1.82 (*)    GFR, Estimated 45 (*)    All other components within normal limits  RESP PANEL BY RT-PCR (FLU A&B, COVID) ARPGX2  CBC  TROPONIN I (HIGH SENSITIVITY)  TROPONIN I (HIGH SENSITIVITY)    EKG EKG Interpretation  Date/Time:  Saturday March 26 2021 20:39:01 EST Ventricular Rate:  90 PR Interval:  146 QRS Duration: 96 QT Interval:  356 QTC Calculation: 435 R Axis:   90 Text Interpretation: Normal sinus rhythm Rightward axis Borderline ECG When compared with ECG of 14-Oct-2019 22:45, Incomplete right bundle branch block is no longer Present no other changes seen no ST elevation Confirmed by Eber HongMiller, Stevee Valenta ([id number]) on 03/26/2021 8:45:54 PM  Radiology DG Chest Port 1 View  Result Date: 03/26/2021 CLINICAL DATA:  Chest pain EXAM: PORTABLE CHEST 1 VIEW COMPARISON:  09/05/2020 FINDINGS: The heart size and mediastinal contours are within normal limits. No focal airspace  consolidation, pleural effusion, or pneumothorax. The visualized skeletal structures are unremarkable. IMPRESSION: No active disease. Electronically Signed   By: Duanne GuessNicholas  Plundo D.O.   On: 03/26/2021 21:52    Procedures Procedures    Medications Ordered in ED Medications  aspirin chewable tablet 324 mg (324 mg Oral Given 03/26/21 2116)  sodium chloride 0.9 % bolus 1,000 mL (0 mLs Intravenous Stopped 03/26/21 2310)    ED Course/ Medical Decision Making/ A&P                           Medical Decision Making The patient's exam is actually rather unremarkable, he is hypertensive at 159/85 but not tachycardic, his EKG is nonischemic, he does not appear to be hypoxic but is complained of increasing coughing with right-sided chest pain.  We will get an x-ray to rule out pneumonia or pneumothorax, troponins to rule out acute ischemia.  His pain is sharp and positional and does not seem to be exertional as would would be expected with traditional coronary disease.  The patient is agreeable to the plan, full dose aspirin given  Problems Addressed: Chest pain: acute illness or injury    Details: No signs of elevated troponin, chest x-ray is Renal insufficiency: acute illness or injury    Details: IV fluids have been given, creatinine was increased slightly but should improve with IV fluid Right-sided chest pain: acute illness or injury  Amount and/or Complexity of Data Reviewed External Data Reviewed: ECG and notes.    Details: Reviewed prior notes and EKGs, no acute changes compared to prior Labs: ordered.    Details: I personally interpreted the lab results, troponin is negative, other laboratory data suggest  a renal insufficiency, IV fluids were given Radiology: ordered and independent interpretation performed.    Details: I personally viewed and interpreted the x-ray, no signs of acute pneumonia or other significant abnormalities ECG/medicine tests: ordered and independent interpretation  performed. Decision-making details documented in ED Course.    Details: Please see note for EKG interpretation Discussion of management or test interpretation with external provider(s): Patient was given full dose aspirin, IV fluids, improved and though he continued to have this constant right-sided chest pain he had 2 negative troponins suggesting that this was not acute coronary syndrome.  His chest pain is actually been going on for several days and seems to get worse when he tries to move around suggestive that this is musculoskeletal.  I have reviewed the patient's vital signs and in fact his blood pressure has come down to 116/60, his pulse of 75, his lungs are clear, he has no swelling of his legs, at this time I think the patient is stable for discharge  We did discuss the possibilities of doing a CT scan of the chest however with the patient's renal insufficiency I do not think this is appropriate and his risk for pulmonary embolism is low.  He is on antiplatelet therapy with both clopidogrel and aspirin according to his report  Risk Risk Details: Multiple high risk differentials including acute coronary syndrome, aortic dissection, pulmonary embolism, pericarditis, myocarditis were all entertained, ultimately there was no evidence that his symptoms were related to any of these.  Clinically the patient has a normal blood pressure without radiation to his back with normal pulses and no neurologic symptoms to suggest that this is an aortic dissection.  He has not hypoxic nor tachycardic and has no swelling of his legs to suggest that this would be a pulmonary embolism   At this time the patient is stable for discharge, he is agreeable to follow-up with his family doctor for recheck of his kidney function   The patient's respiratory rate on my exam is 1518, he is in no distress has no increased work of breathing and speaks in full sentences.  My exam does not correlate with the pelvis vital signs  which are coming across through the automatic monitor and not from clinical exam.     Final Clinical Impression(s) / ED Diagnoses Final diagnoses:  Right-sided chest pain  Renal insufficiency     Eber HongMiller, Dorianne Perret, MD 03/26/21 2314    Eber HongMiller, Monifa Blanchette, MD 03/26/21 [phone]

## 2021-03-26 NOTE — Discharge Instructions (Addendum)
Your testing today showed that you had some dehydration with regards to your kidney, we were able to give you IV fluids and I would like for you to have your kidneys rechecked at your doctor's office within the week.  Your creatinine today was 1.8, it should come down at your visit within the week.  If you cannot go back to your doctor within the week please come back to the ER to have this rechecked.  In the meantime drink plenty of clear liquids.  Thankfully your chest pain does not appear to be related to a heart attack.  There is no signs of heart attack on your testing including your EKG or your blood work.  I would like for you to see your heart doctor within the next 48 hours, I have given you their phone number above    I would like for you to return to the emergency department if you develop severe or worsening symptoms or any other concerning questions or worrisome symptoms

## 2021-03-27 ENCOUNTER — Other Ambulatory Visit: Payer: Self-pay | Admitting: "Endocrinology

## 2021-04-15 ENCOUNTER — Other Ambulatory Visit: Payer: Self-pay

## 2021-04-15 ENCOUNTER — Emergency Department (HOSPITAL_COMMUNITY)
Admission: EM | Admit: 2021-04-15 | Discharge: 2021-04-16 | Disposition: A | Discharge status: Home / Self Care | Payer: Medicare Other | Attending: Emergency Medicine | Admitting: Emergency Medicine

## 2021-04-15 ENCOUNTER — Emergency Department (HOSPITAL_COMMUNITY): Payer: Medicare Other

## 2021-04-15 ENCOUNTER — Encounter (HOSPITAL_COMMUNITY): Payer: Self-pay

## 2021-04-15 DIAGNOSIS — E039 Hypothyroidism, unspecified: Secondary | ICD-10-CM | POA: Insufficient documentation

## 2021-04-15 DIAGNOSIS — E119 Type 2 diabetes mellitus without complications: Secondary | ICD-10-CM | POA: Diagnosis not present

## 2021-04-15 DIAGNOSIS — F17210 Nicotine dependence, cigarettes, uncomplicated: Secondary | ICD-10-CM | POA: Insufficient documentation

## 2021-04-15 DIAGNOSIS — I2510 Atherosclerotic heart disease of native coronary artery without angina pectoris: Secondary | ICD-10-CM | POA: Insufficient documentation

## 2021-04-15 DIAGNOSIS — I509 Heart failure, unspecified: Secondary | ICD-10-CM | POA: Insufficient documentation

## 2021-04-15 DIAGNOSIS — R1031 Right lower quadrant pain: Secondary | ICD-10-CM | POA: Insufficient documentation

## 2021-04-15 DIAGNOSIS — I110 Hypertensive heart disease with heart failure: Secondary | ICD-10-CM | POA: Diagnosis not present

## 2021-04-15 DIAGNOSIS — R109 Unspecified abdominal pain: Secondary | ICD-10-CM

## 2021-04-15 LAB — CBC
HCT: 46.3 % (ref 39.0–52.0)
Hemoglobin: 14.8 g/dL (ref 13.0–17.0)
MCH: 27.4 pg (ref 26.0–34.0)
MCHC: 32 g/dL (ref 30.0–36.0)
MCV: 85.6 fL (ref 80.0–100.0)
Platelets: 263 10*3/uL (ref 150–400)
RBC: 5.41 MIL/uL (ref 4.22–5.81)
RDW: 14.1 % (ref 11.5–15.5)
WBC: 9.3 10*3/uL (ref 4.0–10.5)
nRBC: 0 % (ref 0.0–0.2)

## 2021-04-15 MED ORDER — HYDROMORPHONE HCL 1 MG/ML IJ SOLN
1.0000 mg | Freq: Once | INTRAMUSCULAR | Status: AC
  Administered 2021-04-16: 1 mg via INTRAVENOUS
  Filled 2021-04-15: qty 1

## 2021-04-15 NOTE — ED Provider Notes (Signed)
[facility] Emergency Department Provider Note MRN:  [id number]  Arrival date & time: 04/16/21     Chief Complaint   Flank Pain   History of Present Illness   Devin [name] is a 51 y.o. year-old male with a history of CAD, CHF, kidney stones, stroke, diabetes presenting to the ED with chief complaint of flank pain.  Right flank pain with radiation into the right lower quadrant.  Present for a few days.  No fever, no chest pain or shortness of breath, no dysuria or hematuria.  Review of Systems  A thorough review of systems was obtained and all systems are negative except as noted in the HPI and PMH.   Patient's Health History    Past Medical History:  Diagnosis Date   Asthma    Chest pain    CHF (congestive heart failure) ([place])    Coronary artery disease    a. s/p DES x2 to LAD and DES to OM in 11/2017   Depression    Dyspnea    GERD (gastroesophageal reflux disease)    Headache    History of kidney stones    History of rhabdomyolysis    Hyperlipidemia    Hypertension    Hypothyroidism    Ischemic cardiomyopathy    a. 11/2017: echo showing EF of 35-40%, diffuse HK, and Grade 2 DD   Kidney calculus 2014   Pericardial effusion    Small, by dobutamine echocardiogram, 04/2006   PONV (postoperative nausea and vomiting)    Sleep apnea    does not wear cpap   Stroke ([place])    Tobacco abuse    Type 2 diabetes mellitus without complications ([place]) [id number]   Vitamin D deficiency     Past Surgical History:  Procedure Laterality Date   APPENDECTOMY     CARDIAC CATHETERIZATION  09/2012   "Nonobstructive CAD with 30% proximal, 40% mid LAD disease; 30% proximal CFX; EF 55-65%"   CHOLECYSTECTOMY N/A 01/27/2013   Procedure: LAPAROSCOPIC CHOLECYSTECTOMY;  Surgeon: Jamesetta So, MD;  Location: [facility];  Service: General;  Laterality: N/A;   CORONARY STENT INTERVENTION N/A 11/15/2017   Procedure: CORONARY STENT INTERVENTION;  Surgeon: Leonie Man, MD;   Location: [facility];  Service: Cardiovascular;  Laterality: N/A;   HERNIA REPAIR     As a child   INSERTION OF MESH N/A 11/13/2012   Procedure: INSERTION OF MESH;  Surgeon: Jamesetta So, MD;  Location: [facility];  Service: General;  Laterality: N/A;   IR ANGIO INTRA EXTRACRAN SEL COM CAROTID INNOMINATE BILAT MOD SED  12/01/2017   IR ANGIO INTRA EXTRACRAN SEL COM CAROTID INNOMINATE BILAT MOD SED  02/13/2018   IR ANGIO VERTEBRAL SEL SUBCLAVIAN INNOMINATE UNI L MOD SED  02/13/2018   IR ANGIO VERTEBRAL SEL VERTEBRAL BILAT MOD SED  12/01/2017   IR ANGIO VERTEBRAL SEL VERTEBRAL UNI R MOD SED  02/13/2018   IR INTRA CRAN STENT  12/03/2017   IR PTA INTRACRANIAL  02/25/2018   IR US GUIDE VASC ACCESS RIGHT  02/13/2018   KIDNEY STONE SURGERY     LEFT HEART CATH AND CORONARY ANGIOGRAPHY N/A 11/15/2017   Procedure: LEFT HEART CATH AND CORONARY ANGIOGRAPHY;  Surgeon: Leonie Man, MD;  Location: [place] [facility];  Service: Cardiovascular;  Laterality: N/A;   PERCUTANEOUS NEPHROLITHOTRIPSY     RADIOLOGY WITH ANESTHESIA Left 12/03/2017   Procedure: Angioplasty with possible stenting of left VBJ;  Surgeon: Luanne Bras, MD;  Location:  [facility];  Service: Radiology;  Laterality: Left;   RADIOLOGY WITH ANESTHESIA N/A 02/25/2018   Procedure: STENT PLACEMENT;  Surgeon: Luanne Bras, MD;  Location: [facility];  Service: Radiology;  Laterality: N/A;   UMBILICAL HERNIA REPAIR N/A 11/13/2012   Procedure: UMBILICAL HERNIORRHAPHY;  Surgeon: Jamesetta So, MD;  Location: [facility];  Service: General;  Laterality: N/A;   VARICOCELECTOMY      Family History  Problem Relation Age of Onset   Coronary artery disease Father    Hypertension Father    Hyperlipidemia Father    Diabetes Father    Congestive Heart Failure Father 25   Arrhythmia Father        had an ICD   Diabetes Mother    Hypertension Mother    Hyperlipidemia Mother    Obesity Mother 33       died after bariatric surgery, liver failure and  infection   Coronary artery disease Maternal Grandfather        both grandfathers and several uncles   Coronary artery disease Other    Diabetes Sister        both sisters   Stroke Maternal Aunt    Stroke Maternal Uncle     Social History   Socioeconomic History   Marital status: Divorced    Spouse name: Not on file   Number of children: Not on file   Years of education: Not on file   Highest education level: Not on file  Occupational History   Occupation: Disability     Comment:      Employer: [employer]  Tobacco Use   Smoking status: Every Day    Packs/day: 1.00    Years: 26.00    Pack years: 26.00    Types: Cigarettes    Start date: 03/13/1985   Smokeless tobacco: Former    Types: Snuff, Chew    Quit date: 03/14/1987  Vaping Use   Vaping Use: Former   Devices: quit in 2011  Substance and Sexual Activity   Alcohol use: No   Drug use: No   Sexual activity: Not on file  Other Topics Concern   Not on file  Social History Narrative   Eats fast food daily   No exercise outside of work   Lives in [place] with his dog   MAINTENANCE TECHNICIAN   Caffeine use: tea/soda daily   Left handed    Social Determinants of Health   Financial Resource Strain: Not on file  Food Insecurity: Not on file  Transportation Needs: Not on file  Physical Activity: Not on file  Stress: Not on file  Social Connections: Not on file  Intimate Partner Violence: Not on file     Physical Exam   Vitals:   04/15/21 2153  BP: (!) 164/99  Pulse: 92  Resp: 20  Temp: 97.9 F (36.6 C)  SpO2: 100%    CONSTITUTIONAL: Well-appearing, NAD NEURO/PSYCH:  Alert and oriented x 3, no focal deficits EYES:  eyes equal and reactive ENT/NECK:  no LAD, no JVD CARDIO: Regular rate, well-perfused, normal S1 and S2 PULM:  CTAB no wheezing or rhonchi GI/GU:  non-distended, non-tender, moderate right CVA tenderness MSK/SPINE:  No gross deformities, no edema SKIN:  no rash, atraumatic   *Additional  and/or pertinent findings included in MDM below  Diagnostic and Interventional Summary    EKG Interpretation  Date/Time:  Friday April 15 2021 23:35:55 EST Ventricular Rate:  78 PR Interval:  144 QRS Duration: 150 QT Interval:  419 QTC Calculation: 478 R Axis:   97 Text Interpretation: Sinus rhythm Nonspecific intraventricular conduction delay Baseline wander in lead(s) V1 Confirmed by Gerlene Fee [phone]) on 04/15/2021 11:47:34 PM       Labs Reviewed  COMPREHENSIVE METABOLIC PANEL - Abnormal; Notable for the following components:      Result Value   Glucose, Bld 310 (*)    ALT 80 (*)    All other components within normal limits  URINALYSIS, ROUTINE W REFLEX MICROSCOPIC - Abnormal; Notable for the following components:   Glucose, UA >=500 (*)    All other components within normal limits  URINALYSIS, MICROSCOPIC (REFLEX) - Abnormal; Notable for the following components:   Bacteria, UA RARE (*)    All other components within normal limits  CBC  LIPASE, BLOOD    CT RENAL STONE STUDY  Final Result      Medications  HYDROmorphone (DILAUDID) injection 1 mg (1 mg Intravenous Given 04/16/21 0002)     Procedures  /  Critical Care Procedures  ED Course and Medical Decision Making  Initial Impression and Ddx Suspect kidney stone, will obtain CT to confirm diagnosis and exclude any signs of alternate pathology such as AAA, intra-abdominal infection, etc.  Past medical/surgical history that increases complexity of ED encounter: History of kidney stones, vascular disease  Interpretation of Diagnostics I personally reviewed the EKG and my interpretation is as follows: Sinus rhythm without significant change from prior    Labs are overall reassuring with no AKI, no signs of urinary tract infection.  CT scan is without obstructing stone.  Patient Reassessment and Ultimate Disposition/Management Patient feeling better, suspect MSK back pain.  Patient has no numbness or weakness  to the arms or legs, no bowel or bladder dysfunction, nothing to suggest myelopathy.  Appropriate for discharge.  Patient management required discussion with the following services or consulting groups:  None  Complexity of Problems Addressed Acute complicated illness or Injury  Additional Data Reviewed and Analyzed Further history obtained from: Past medical history and medications listed in the EMR  Factors Impacting ED Encounter Risk Prescriptions  Barth Kirks. Sedonia Small, [place] [email or web address]  Final Clinical Impressions(s) / ED Diagnoses     ICD-10-CM   1. Flank pain  R10.9       ED Discharge Orders          Ordered    methocarbamol (ROBAXIN) 500 MG tablet  Every 8 hours PRN        04/16/21 0042             Discharge Instructions Discussed with and Provided to Patient:     Discharge Instructions      You were evaluated in the Emergency Department and after careful evaluation, we did not find any emergent condition requiring admission or further testing in the hospital.  Your exam/testing today was overall reassuring.  CT scan without signs of kidney stones causing your pain.  You do have some kidney stones in your kidneys that are currently not causing any problems.  Your pain is likely due to a muscle strain or spasm.  Recommend Tylenol 1000 mg every 4-6 hours for pain.  You can also use the Robaxin muscle relaxer.  Also recommend warm compresses and light stretching.  Please return to the Emergency Department if you experience any worsening of your condition.  Thank you for allowing [name] to be a part of your care.  Maudie Flakes, MD 04/16/21 [phone]

## 2021-04-15 NOTE — ED Notes (Signed)
Patient transported to CT 

## 2021-04-15 NOTE — ED Triage Notes (Signed)
Report "I think I have kidney stones again." Lower back pain on the right side.

## 2021-04-16 DIAGNOSIS — R1031 Right lower quadrant pain: Secondary | ICD-10-CM | POA: Diagnosis not present

## 2021-04-16 LAB — COMPREHENSIVE METABOLIC PANEL
ALT: 80 U/L — ABNORMAL HIGH (ref 0–44)
AST: 35 U/L (ref 15–41)
Albumin: 3.9 g/dL (ref 3.5–5.0)
Alkaline Phosphatase: 107 U/L (ref 38–126)
Anion gap: 9 (ref 5–15)
BUN: 18 mg/dL (ref 6–20)
CO2: 25 mmol/L (ref 22–32)
Calcium: 9.8 mg/dL (ref 8.9–10.3)
Chloride: 103 mmol/L (ref 98–111)
Creatinine, Ser: 1.08 mg/dL (ref 0.61–1.24)
GFR, Estimated: >60 mL/min (ref >60)
Glucose, Bld: 310 mg/dL — ABNORMAL HIGH (ref 70–99)
Potassium: 3.6 mmol/L (ref 3.5–5.1)
Sodium: 137 mmol/L (ref 135–145)
Total Bilirubin: 0.4 mg/dL (ref 0.3–1.2)
Total Protein: 7.4 g/dL (ref 6.5–8.1)

## 2021-04-16 LAB — URINALYSIS, ROUTINE W REFLEX MICROSCOPIC
Bilirubin Urine: NEGATIVE
Glucose, UA: >=500 mg/dL — AB
Hgb urine dipstick: NEGATIVE
Ketones, ur: NEGATIVE mg/dL
Leukocytes,Ua: NEGATIVE
Nitrite: NEGATIVE
Protein, ur: NEGATIVE mg/dL
Specific Gravity, Urine: 1.025 (ref 1.005–1.030)
pH: 6 (ref 5.0–8.0)

## 2021-04-16 LAB — URINALYSIS, MICROSCOPIC (REFLEX)

## 2021-04-16 LAB — LIPASE, BLOOD: Lipase: 42 U/L (ref 11–51)

## 2021-04-16 MED ORDER — METHOCARBAMOL 500 MG PO TABS: 500.0000 mg | ORAL_TABLET | Freq: Three times a day (TID) | ORAL | 0 refills | Status: DC | PRN

## 2021-04-16 NOTE — Discharge Instructions (Signed)
You were evaluated in the Emergency Department and after careful evaluation, we did not find any emergent condition requiring admission or further testing in the hospital.  Your exam/testing today was overall reassuring.  CT scan without signs of kidney stones causing your pain.  You do have some kidney stones in your kidneys that are currently not causing any problems.  Your pain is likely due to a muscle strain or spasm.  Recommend Tylenol 1000 mg every 4-6 hours for pain.  You can also use the Robaxin muscle relaxer.  Also recommend warm compresses and light stretching.  Please return to the Emergency Department if you experience any worsening of your condition.  Thank you for allowing [name] to be a part of your care.

## 2021-05-01 ENCOUNTER — Other Ambulatory Visit: Payer: Self-pay | Admitting: "Endocrinology

## 2021-06-01 DIAGNOSIS — E039 Hypothyroidism, unspecified: Secondary | ICD-10-CM | POA: Diagnosis not present

## 2021-06-01 DIAGNOSIS — E1169 Type 2 diabetes mellitus with other specified complication: Secondary | ICD-10-CM | POA: Diagnosis not present

## 2021-06-01 DIAGNOSIS — K219 Gastro-esophageal reflux disease without esophagitis: Secondary | ICD-10-CM | POA: Diagnosis not present

## 2021-06-07 ENCOUNTER — Encounter: Payer: Self-pay | Admitting: Cardiology

## 2021-06-07 ENCOUNTER — Ambulatory Visit (INDEPENDENT_AMBULATORY_CARE_PROVIDER_SITE_OTHER): Payer: Medicare Other | Admitting: Cardiology

## 2021-06-07 VITALS — BP 128/84 | HR 79 | Ht 78.0 in | Wt 269.2 lb

## 2021-06-07 DIAGNOSIS — E782 Mixed hyperlipidemia: Secondary | ICD-10-CM

## 2021-06-07 DIAGNOSIS — I2510 Atherosclerotic heart disease of native coronary artery without angina pectoris: Secondary | ICD-10-CM

## 2021-06-07 NOTE — Progress Notes (Signed)
? ? ? ?Clinical Summary ?Mr. Gelinas is a 51 y.o.male seen today for follow up of the following medical problems.  ?  ?  ?  ?1. CAD/Chest pain/Chronic systolic HF ?- admitted [id number] with chest pain concerning for unstable angina.  ?11/2017 coronary CTA with severe prox LAD disease and possible mid LAD disease and D1. ?  ?11/2017 cath LM patent, LAD mid 80 to 90%, LCX distal 90%, OM2 90%, LPDA 90%, RCA small with prox 99% LVgram 35-45% (no echo on file). DES x 2 to LAD, DES to OM,  trifurcation lesion distal LCX, LPL and PDA recs for medical therapy, poor pci target. RCA small vessel treated medically.  ?  ?Interventional cards recommended extended DAPT ?  ? 01/2018 echo: LVEF 35-40%, grade I diastoluc dysfunction ? - bradycardia on beta blocketers. History of rhabdo on crestor. Low bp and dizzienss on entresto. ?  ?  ?  ?10/2018 nuclear stress: no clear ischemia. LVEF 46% ?- compliant with meds ? - no exertional chest pain symptoms.  ?  ? 10/2019 echo  ?LVEF 50-55%, no WMAs, indet diastolic fxn, nomral RV ?  ?  ?Jan 2023 ER visit with right sided chest pain ?- trop neg x 2. EKG without ischemic changes. CXR no acute proces ?- last episode in January.  ?- compliant with meds ? ? ? 2. COPD ?- abnormal PFTs 02/2019 ?- on dulera, followed by pcp ?  ?  ? 2. Hyperlipidemia ?- history of rhabdo on crestor ?- previously extraordinarily high TGs in setting of poorly controlled DM2, hypothyroidism ? -followed in lipid clinic. On repatha, zetia, fenofirbate, vascepa ?12/2019 TC 235 TG 321 HDL 25 LDL 150. Direct LDL 143 ?  ?- recent labs with pcp ?  ?3. History of CVA ?- history of CVA 11/2017 ?- had underwent an image-guided diagnostic cerebral angiogram 12/01/2017 with Dr. 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 He then underwent an image-guided cerebral angiogram with revascularization of his vertebrobasilar junction/proximal basilar artery stenosis using stent  assisted angioplasty 12/03/2017 by Dr. [name] ?- later had ISR requiring repeat procedure 02/2018 ?  ?  ?  ?SH: has 2 boxer dogs at home, 89 and 3. Oldest dog passed recently, now just has one boxer ? ? ?Past Medical History:  ?Diagnosis Date  ? Asthma   ? Chest pain   ? CHF (congestive heart failure) ([place])   ? Coronary artery disease   ? a. s/p DES x2 to LAD and DES to OM in 11/2017  ? Depression   ? Dyspnea   ? GERD (gastroesophageal reflux disease)   ? Headache   ? History of kidney stones   ? History of rhabdomyolysis   ? Hyperlipidemia   ? Hypertension   ? Hypothyroidism   ? Ischemic cardiomyopathy   ? a. 11/2017: echo showing EF of 35-40%, diffuse HK, and Grade 2 DD  ? Kidney calculus 2014  ? Pericardial effusion   ? Small, by dobutamine echocardiogram, 04/2006  ? PONV (postoperative nausea and vomiting)   ? Sleep apnea   ? does not wear cpap  ? Stroke [facility])   ? Tobacco abuse   ? Type 2 diabetes mellitus without complications ([place]) 123XX123  ? Vitamin D deficiency   ? ? ? ?Allergies  ?Allergen Reactions  ? Crestor [Rosuvastatin] Other (See Comments)  ?  rhabdomyolosis  ? Trulicity [Dulaglutide] Nausea And Vomiting  ? Bee Venom Swelling  ?  SWELLING REACTION UNSPECIFIED   ? ? ? ?  Current Outpatient Medications  ?Medication Sig Dispense Refill  ? aspirin 81 MG EC tablet Take 1 tablet (81 mg total) by mouth daily. With food 30 tablet 0  ? clopidogrel (PLAVIX) 75 MG tablet Take 1 tablet (75 mg total) by mouth daily. 30 tablet 1  ? Continuous Blood Gluc Receiver (FREESTYLE LIBRE 2 READER) DEVI As directed 1 each 0  ? Continuous Blood Gluc Sensor (FREESTYLE LIBRE 2 SENSOR) MISC APPLY 1 PATCH TO SKIN EVERY 14 DAYS 2 each 3  ? DULERA 200-5 MCG/ACT AERO Inhale 2 puffs into the lungs 2 (two) times daily. 8.8 g 1  ? EUTHYROX 200 MCG tablet Take 200 mcg by mouth at bedtime.    ? Evolocumab (REPATHA SURECLICK) XX123456 MG/ML SOAJ Inject 1 pen into the skin every 14 (fourteen) days. 1 mL 11  ? ezetimibe (ZETIA) 10 MG tablet  Take 1 tablet (10 mg total) by mouth daily. 30 tablet 1  ? famotidine (PEPCID) 40 MG tablet Take 40 mg by mouth daily.    ? fenofibrate (TRICOR) 145 MG tablet Take 1 tablet (145 mg total) by mouth daily. 30 tablet 1  ? fluticasone (FLONASE) 50 MCG/ACT nasal spray Place 2 sprays into both nostrils 2 (two) times daily.    ? furosemide (LASIX) 20 MG tablet TAKE 1 TABLET BY MOUTH AS NEEDED FOR  SWELLING 30 tablet 4  ? gabapentin (NEURONTIN) 300 MG capsule One po q AM, one po q PM and 2 po qHS 120 capsule 11  ? gemfibrozil (LOPID) 600 MG tablet Take 600 mg by mouth 2 (two) times daily.    ? glipiZIDE (GLUCOTROL XL) 10 MG 24 hr tablet Take 10 mg by mouth daily.    ? glucose blood (ACCU-CHEK GUIDE) test strip Test glucose 2 times a day 100 each 2  ? Insulin Lispro Prot & Lispro (HUMALOG MIX 75/25 KWIKPEN) (75-25) 100 UNIT/ML Kwikpen Inject 100 Units into the skin 2 (two) times daily with a meal. 60 mL 2  ? Insulin Pen Needle (B-D ULTRAFINE III SHORT PEN) 31G X 8 MM MISC Use as directed to inject insulin 2 times daily. 200 each 1  ? levothyroxine (SYNTHROID) 88 MCG tablet Take 88 mcg by mouth daily.    ? methocarbamol (ROBAXIN) 500 MG tablet Take 1 tablet (500 mg total) by mouth every 8 (eight) hours as needed for muscle spasms. 30 tablet 0  ? nitroGLYCERIN (NITROSTAT) 0.4 MG SL tablet Place 1 tablet (0.4 mg total) under the tongue every 5 (five) minutes as needed for chest pain. 25 tablet 3  ? pantoprazole (PROTONIX) 40 MG tablet TAKE 1 TABLET BY MOUTH ONCE DAILY - NEED TO SCHEDULE AN APPOINTMENT 30 tablet 4  ? sildenafil (VIAGRA) 100 MG tablet TAKE 1/4 TABLET BY MOUTH 30 MINUTES BEFORE SEXUAL ACTIVITY IF 1/4 DOESN T WORK YOU MAY INCREASE TO 1/2 TABLET MAY INCREASE BY 1/4 TABLET DAILY UP TO 1 TABLET. DO NOT TAKE ANY SUBLINGUAL NITROGLYCERIN WHILE TAKING THIS MEDICATION    ? sitaGLIPtin (JANUVIA) 100 MG tablet Take 1 tablet (100 mg total) by mouth daily. 30 tablet 1  ? VASCEPA 1 g capsule Take 2 capsules (2 g total) by  mouth 2 (two) times daily. 360 capsule 1  ? VENTOLIN HFA 108 (90 Base) MCG/ACT inhaler Inhale into the lungs.    ? ?No current facility-administered medications for this visit.  ? ? ? ?Past Surgical History:  ?Procedure Laterality Date  ? APPENDECTOMY    ? CARDIAC CATHETERIZATION  09/2012  ? "  Nonobstructive CAD with 30% proximal, 40% mid LAD disease; 30% proximal CFX; EF 55-65%"  ? CHOLECYSTECTOMY N/A 01/27/2013  ? Procedure: LAPAROSCOPIC CHOLECYSTECTOMY;  Surgeon: Jamesetta So, MD;  Location: [facility];  Service: General;  Laterality: N/A;  ? CORONARY STENT INTERVENTION N/A 11/15/2017  ? Procedure: CORONARY STENT INTERVENTION;  Surgeon: Leonie Man, MD;  Location: [place] [facility];  Service: Cardiovascular;  Laterality: N/A;  ? HERNIA REPAIR    ? As a child  ? INSERTION OF MESH N/A 11/13/2012  ? Procedure: INSERTION OF MESH;  Surgeon: Jamesetta So, MD;  Location: [facility];  Service: General;  Laterality: N/A;  ? IR ANGIO INTRA EXTRACRAN SEL COM CAROTID INNOMINATE BILAT MOD SED  12/01/2017  ? IR ANGIO INTRA EXTRACRAN SEL COM CAROTID INNOMINATE BILAT MOD SED  02/13/2018  ? IR ANGIO VERTEBRAL SEL SUBCLAVIAN INNOMINATE UNI L MOD SED  02/13/2018  ? IR ANGIO VERTEBRAL SEL VERTEBRAL BILAT MOD SED  12/01/2017  ? IR ANGIO VERTEBRAL SEL VERTEBRAL UNI R MOD SED  02/13/2018  ? IR INTRA CRAN STENT  12/03/2017  ? IR PTA INTRACRANIAL  02/25/2018  ? IR US GUIDE VASC ACCESS RIGHT  02/13/2018  ? KIDNEY STONE SURGERY    ? LEFT HEART CATH AND CORONARY ANGIOGRAPHY N/A 11/15/2017  ? Procedure: LEFT HEART CATH AND CORONARY ANGIOGRAPHY;  Surgeon: Leonie Man, MD;  Location: [place] [facility];  Service: Cardiovascular;  Laterality: N/A;  ? PERCUTANEOUS NEPHROLITHOTRIPSY    ? RADIOLOGY WITH ANESTHESIA Left 12/03/2017  ? Procedure: Angioplasty with possible stenting of left VBJ;  Surgeon: Luanne Bras, MD;  Location: [place];  Service: Radiology;  Laterality: Left;  ? RADIOLOGY WITH ANESTHESIA N/A 02/25/2018  ? Procedure: STENT PLACEMENT;   Surgeon: Luanne Bras, MD;  Location: [place];  Service: Radiology;  Laterality: N/A;  ? UMBILICAL HERNIA REPAIR N/A 11/13/2012  ? Procedure: UMBILICAL HERNIORRHAPHY;  Surgeon: Jamesetta So, MD;  Location: [facility]

## 2021-06-07 NOTE — Patient Instructions (Signed)
Medication Instructions:  ?Stop Lopid (Gemfibrozil) ?Continue all other medications.    ? ?Labwork: ?none ? ?Testing/Procedures: ?none ? ?Follow-Up: ?6 months  ? ?Any Other Special Instructions Will Be Listed Below (If Applicable). ? ? ?If you need a refill on your cardiac medications before your next appointment, please call your pharmacy. ? ?

## 2021-06-14 LAB — COMPREHENSIVE METABOLIC PANEL
ALT: 63 IU/L — ABNORMAL HIGH (ref 0–44)
AST: 38 IU/L (ref 0–40)
Albumin/Globulin Ratio: 1.5 (ref 1.2–2.2)
Albumin: 4.6 g/dL (ref 4.0–5.0)
Alkaline Phosphatase: 171 IU/L — ABNORMAL HIGH (ref 44–121)
BUN/Creatinine Ratio: 19 (ref 9–20)
BUN: 20 mg/dL (ref 6–24)
Bilirubin Total: 0.2 mg/dL (ref 0.0–1.2)
CO2: 21 mmol/L (ref 20–29)
Calcium: 10.5 mg/dL — ABNORMAL HIGH (ref 8.7–10.2)
Chloride: 94 mmol/L — ABNORMAL LOW (ref 96–106)
Creatinine, Ser: 1.07 mg/dL (ref 0.76–1.27)
Globulin, Total: 3 g/dL (ref 1.5–4.5)
Glucose: 290 mg/dL — ABNORMAL HIGH (ref 70–99)
Potassium: 4.4 mmol/L (ref 3.5–5.2)
Sodium: 135 mmol/L (ref 134–144)
Total Protein: 7.6 g/dL (ref 6.0–8.5)
eGFR: 85 mL/min/{1.73_m2} (ref >59)

## 2021-06-14 LAB — TSH: TSH: 0.753 u[IU]/mL (ref 0.450–4.500)

## 2021-06-14 LAB — T4, FREE: Free T4: 1.75 ng/dL (ref 0.82–1.77)

## 2021-06-14 LAB — LIPID PANEL
Chol/HDL Ratio: 8.9 ratio — ABNORMAL HIGH (ref 0.0–5.0)
Cholesterol, Total: 232 mg/dL — ABNORMAL HIGH (ref 100–199)
HDL: 26 mg/dL — ABNORMAL LOW (ref >39)
LDL Chol Calc (NIH): 86 mg/dL (ref 0–99)
Triglycerides: 730 mg/dL (ref 0–149)
VLDL Cholesterol Cal: 120 mg/dL — ABNORMAL HIGH (ref 5–40)

## 2021-06-14 LAB — VITAMIN D 25 HYDROXY (VIT D DEFICIENCY, FRACTURES): Vit D, 25-Hydroxy: 22.7 ng/mL — ABNORMAL LOW (ref 30.0–100.0)

## 2021-06-15 ENCOUNTER — Ambulatory Visit: Payer: Medicaid Other | Admitting: "Endocrinology

## 2021-06-15 ENCOUNTER — Encounter: Payer: Self-pay | Admitting: "Endocrinology

## 2021-06-15 ENCOUNTER — Ambulatory Visit (INDEPENDENT_AMBULATORY_CARE_PROVIDER_SITE_OTHER): Payer: Medicare Other | Admitting: "Endocrinology

## 2021-06-15 VITALS — BP 146/88 | HR 84 | Ht 78.0 in | Wt 270.2 lb

## 2021-06-15 DIAGNOSIS — Z91199 Patient's noncompliance with other medical treatment and regimen due to unspecified reason: Secondary | ICD-10-CM

## 2021-06-15 DIAGNOSIS — I10 Essential (primary) hypertension: Secondary | ICD-10-CM

## 2021-06-15 DIAGNOSIS — E782 Mixed hyperlipidemia: Secondary | ICD-10-CM

## 2021-06-15 DIAGNOSIS — E1159 Type 2 diabetes mellitus with other circulatory complications: Secondary | ICD-10-CM

## 2021-06-15 DIAGNOSIS — E039 Hypothyroidism, unspecified: Secondary | ICD-10-CM | POA: Diagnosis not present

## 2021-06-15 DIAGNOSIS — E559 Vitamin D deficiency, unspecified: Secondary | ICD-10-CM

## 2021-06-15 LAB — POCT GLYCOSYLATED HEMOGLOBIN (HGB A1C): HbA1c, POC (controlled diabetic range): 11.7 % — AB (ref 0.0–7.0)

## 2021-06-15 MED ORDER — SITAGLIPTIN PHOSPHATE 50 MG PO TABS: 50.0000 mg | ORAL_TABLET | Freq: Every day | ORAL | 1 refills | Status: DC

## 2021-06-15 MED ORDER — INSULIN LISPRO PROT & LISPRO (75-25 MIX) 100 UNIT/ML KWIKPEN: 80.0000 [IU] | PEN_INJECTOR | Freq: Two times a day (BID) | SUBCUTANEOUS | 2 refills | Status: DC

## 2021-06-15 MED ORDER — GLIPIZIDE ER 10 MG PO TB24: 10.0000 mg | ORAL_TABLET | Freq: Every day | ORAL | 1 refills | Status: DC

## 2021-06-15 NOTE — Progress Notes (Signed)
? ?                                                   ?     06/15/2021, 3:46 PM ? ?Endocrinology follow-up note ? ? ?Subjective:  ? ? Patient ID: Devin [name], male    DOB: November 24, 1970.  ?Devin [name] is being seen in follow-up after he was seen in consultation for management of currently uncontrolled symptomatic diabetes requested by  [place], MD. ?  ? ?Past Medical History:  ?Diagnosis Date  ? Asthma   ? Chest pain   ? CHF (congestive heart failure) ([place])   ? Coronary artery disease   ? a. s/p DES x2 to LAD and DES to OM in 11/2017  ? Depression   ? Dyspnea   ? GERD (gastroesophageal reflux disease)   ? Headache   ? History of kidney stones   ? History of rhabdomyolysis   ? Hyperlipidemia   ? Hypertension   ? Hypothyroidism   ? Ischemic cardiomyopathy   ? a. 11/2017: echo showing EF of 35-40%, diffuse HK, and Grade 2 DD  ? Kidney calculus 2014  ? Pericardial effusion   ? Small, by dobutamine echocardiogram, 04/2006  ? PONV (postoperative nausea and vomiting)   ? Sleep apnea   ? does not wear cpap  ? Stroke [facility])   ? Tobacco abuse   ? Type 2 diabetes mellitus without complications ([place]) 123XX123  ? Vitamin D deficiency   ? ? ?Past Surgical History:  ?Procedure Laterality Date  ? APPENDECTOMY    ? CARDIAC CATHETERIZATION  09/2012  ? "Nonobstructive CAD with 30% proximal, 40% mid LAD disease; 30% proximal CFX; EF 55-65%"  ? CHOLECYSTECTOMY N/A 01/27/2013  ? Procedure: LAPAROSCOPIC CHOLECYSTECTOMY;  Surgeon: Jamesetta So, MD;  Location[facility];  Service: General;  Laterality: N/A;  ? CORONARY STENT INTERVENTION N/A 11/15/2017  ? Procedure: CORONARY STENT INTERVENTION;  Surgeon: Leonie Man, MD;  Location: [place] [facility];  Service: Cardiovascular;  Laterality: N/A;  ? HERNIA REPAIR    ? As a child  ? INSERTION OF MESH N/A 11/13/2012  ? Procedure: INSERTION OF MESH;  Surgeon: Jamesetta So, MD;  [facility];  Service: General;  Laterality: N/A;  ? IR ANGIO INTRA EXTRACRAN SEL  COM CAROTID INNOMINATE BILAT MOD SED  12/01/2017  ? IR ANGIO INTRA EXTRACRAN SEL COM CAROTID INNOMINATE BILAT MOD SED  02/13/2018  ? IR ANGIO VERTEBRAL SEL SUBCLAVIAN INNOMINATE UNI L MOD SED  02/13/2018  ? IR ANGIO VERTEBRAL SEL VERTEBRAL BILAT MOD SED  12/01/2017  ? IR ANGIO VERTEBRAL SEL VERTEBRAL UNI R MOD SED  02/13/2018  ? IR INTRA CRAN STENT  12/03/2017  ? IR PTA INTRACRANIAL  02/25/2018  ? IR US GUIDE VASC ACCESS RIGHT  02/13/2018  ? KIDNEY STONE SURGERY    ? LEFT HEART CATH AND CORONARY ANGIOGRAPHY N/A 11/15/2017  ? Procedure: LEFT HEART CATH AND CORONARY ANGIOGRAPHY;  Surgeon: Leonie Man, MD;  Location: [place] [facility];  Service: Cardiovascular;  Laterality: N/A;  ? PERCUTANEOUS NEPHROLITHOTRIPSY    ? RADIOLOGY WITH ANESTHESIA Left 12/03/2017  ? Procedure: Angioplasty with possible stenting of left VBJ;  Surgeon: Luanne Bras, MD;  Location: [place];  Service: Radiology;  Laterality: Left;  ? RADIOLOGY WITH ANESTHESIA N/A 02/25/2018  ?  Procedure: STENT PLACEMENT;  Surgeon: Luanne Bras, MD;  Location: [place];  Service: Radiology;  Laterality: N/A;  ? UMBILICAL HERNIA REPAIR N/A 11/13/2012  ? Procedure: UMBILICAL HERNIORRHAPHY;  Surgeon: Jamesetta So, MD;  Location[facility];  Service: General;  Laterality: N/A;  ? VARICOCELECTOMY    ? ? ?Social History  ? ?Socioeconomic History  ? Marital status: Divorced  ?  Spouse name: Not on file  ? Number of children: Not on file  ? Years of education: Not on file  ? Highest education level: Not on file  ?Occupational History  ? Occupation: Disability   ?  Comment:    ?  Employer: [employer]  ?Tobacco Use  ? Smoking status: Every Day  ?  Packs/day: 1.00  ?  Years: 26.00  ?  Pack years: 26.00  ?  Types: Cigarettes  ?  Start date: 03/13/1985  ? Smokeless tobacco: Former  ?  Types: Snuff, Chew  ?  Quit date: 03/14/1987  ?Vaping Use  ? Vaping Use: Former  ? Devices: quit in 2011  ?Substance and Sexual Activity  ? Alcohol use: No  ? Drug use: No  ? Sexual activity: Not  on file  ?Other Topics Concern  ? Not on file  ?Social History Narrative  ? Eats fast food daily  ? No exercise outside of work  ? Lives in [place] with his dog  ? MAINTENANCE TECHNICIAN  ? Caffeine use: tea/soda daily  ? Left handed   ? ?Social Determinants of Health  ? ?Financial Resource Strain: Not on file  ?Food Insecurity: Not on file  ?Transportation Needs: Not on file  ?Physical Activity: Not on file  ?Stress: Not on file  ?Social Connections: Not on file  ? ? ?Family History  ?Problem Relation Age of Onset  ? Coronary artery disease Father   ? Hypertension Father   ? Hyperlipidemia Father   ? Diabetes Father   ? Congestive Heart Failure Father 72  ? Arrhythmia Father   ?     had an ICD  ? Diabetes Mother   ? Hypertension Mother   ? Hyperlipidemia Mother   ? Obesity Mother 55  ?     died after bariatric surgery, liver failure and infection  ? Coronary artery disease Maternal Grandfather   ?     both grandfathers and several uncles  ? Coronary artery disease Other   ? Diabetes Sister   ?     both sisters  ? Stroke Maternal Aunt   ? Stroke Maternal Uncle   ? ? ?Outpatient Encounter Medications as of 06/15/2021  ?Medication Sig  ? aspirin 81 MG EC tablet Take 1 tablet (81 mg total) by mouth daily. With food  ? clopidogrel (PLAVIX) 75 MG tablet Take 1 tablet (75 mg total) by mouth daily.  ? Continuous Blood Gluc Receiver (FREESTYLE LIBRE 2 READER) DEVI As directed  ? Continuous Blood Gluc Sensor (FREESTYLE LIBRE 2 SENSOR) MISC APPLY 1 PATCH TO SKIN EVERY 14 DAYS  ? DULERA 200-5 MCG/ACT AERO Inhale 2 puffs into the lungs 2 (two) times daily.  ? EUTHYROX 200 MCG tablet Take 200 mcg by mouth at bedtime.  ? Evolocumab (REPATHA SURECLICK) XX123456 MG/ML SOAJ Inject 1 pen into the skin every 14 (fourteen) days.  ? ezetimibe (ZETIA) 10 MG tablet Take 1 tablet (10 mg total) by mouth daily.  ? famotidine (PEPCID) 40 MG tablet Take 40 mg by mouth daily.  ? fenofibrate (TRICOR) 145 MG tablet Take 1  tablet (145 mg total) by mouth  daily.  ? fluticasone (FLONASE) 50 MCG/ACT nasal spray Place 2 sprays into both nostrils 2 (two) times daily.  ? furosemide (LASIX) 20 MG tablet TAKE 1 TABLET BY MOUTH AS NEEDED FOR  SWELLING  ? gabapentin (NEURONTIN) 300 MG capsule One po q AM, one po q PM and 2 po qHS  ? glipiZIDE (GLUCOTROL XL) 10 MG 24 hr tablet Take 1 tablet (10 mg total) by mouth daily.  ? glucose blood (ACCU-CHEK GUIDE) test strip Test glucose 2 times a day  ? Insulin Lispro Prot & Lispro (HUMALOG MIX 75/25 KWIKPEN) (75-25) 100 UNIT/ML Kwikpen Inject 80 Units into the skin 2 (two) times daily with a meal.  ? Insulin Pen Needle (B-D ULTRAFINE III SHORT PEN) 31G X 8 MM MISC Use as directed to inject insulin 2 times daily.  ? levothyroxine (SYNTHROID) 88 MCG tablet Take 88 mcg by mouth daily.  ? methocarbamol (ROBAXIN) 500 MG tablet Take 1 tablet (500 mg total) by mouth every 8 (eight) hours as needed for muscle spasms.  ? nitroGLYCERIN (NITROSTAT) 0.4 MG SL tablet Place 1 tablet (0.4 mg total) under the tongue every 5 (five) minutes as needed for chest pain.  ? pantoprazole (PROTONIX) 40 MG tablet TAKE 1 TABLET BY MOUTH ONCE DAILY - NEED TO SCHEDULE AN APPOINTMENT  ? sildenafil (VIAGRA) 100 MG tablet TAKE 1/4 TABLET BY MOUTH 30 MINUTES BEFORE SEXUAL ACTIVITY IF 1/4 DOESN T WORK YOU MAY INCREASE TO 1/2 TABLET MAY INCREASE BY 1/4 TABLET DAILY UP TO 1 TABLET. DO NOT TAKE ANY SUBLINGUAL NITROGLYCERIN WHILE TAKING THIS MEDICATION  ? sitaGLIPtin (JANUVIA) 50 MG tablet Take 1 tablet (50 mg total) by mouth daily.  ? VASCEPA 1 g capsule Take 2 capsules (2 g total) by mouth 2 (two) times daily.  ? VENTOLIN HFA 108 (90 Base) MCG/ACT inhaler Inhale into the lungs.  ? [DISCONTINUED] glipiZIDE (GLUCOTROL XL) 10 MG 24 hr tablet Take 10 mg by mouth daily.  ? [DISCONTINUED] Insulin Lispro Prot & Lispro (HUMALOG MIX 75/25 KWIKPEN) (75-25) 100 UNIT/ML Kwikpen Inject 100 Units into the skin 2 (two) times daily with a meal.  ? [DISCONTINUED] sitaGLIPtin (JANUVIA)  100 MG tablet Take 1 tablet (100 mg total) by mouth daily.  ? ?No facility-administered encounter medications on file as of 06/15/2021.  ? ? ?ALLERGIES: ?Allergies  ?Allergen Reactions  ? Crestor [employer]

## 2021-06-15 NOTE — Patient Instructions (Signed)

## 2021-06-21 ENCOUNTER — Emergency Department (HOSPITAL_COMMUNITY): Payer: Medicare Other

## 2021-06-21 ENCOUNTER — Emergency Department (HOSPITAL_COMMUNITY)
Admission: EM | Admit: 2021-06-21 | Discharge: 2021-06-22 | Disposition: A | Discharge status: Home / Self Care | Payer: Medicare Other | Attending: Emergency Medicine | Admitting: Emergency Medicine

## 2021-06-21 ENCOUNTER — Encounter (HOSPITAL_COMMUNITY): Payer: Self-pay | Admitting: *Deleted

## 2021-06-21 DIAGNOSIS — Z79899 Other long term (current) drug therapy: Secondary | ICD-10-CM | POA: Insufficient documentation

## 2021-06-21 DIAGNOSIS — J45909 Unspecified asthma, uncomplicated: Secondary | ICD-10-CM | POA: Insufficient documentation

## 2021-06-21 DIAGNOSIS — I63232 Cerebral infarction due to unspecified occlusion or stenosis of left carotid arteries: Secondary | ICD-10-CM | POA: Diagnosis not present

## 2021-06-21 DIAGNOSIS — Z7982 Long term (current) use of aspirin: Secondary | ICD-10-CM | POA: Diagnosis not present

## 2021-06-21 DIAGNOSIS — E119 Type 2 diabetes mellitus without complications: Secondary | ICD-10-CM | POA: Insufficient documentation

## 2021-06-21 DIAGNOSIS — F17210 Nicotine dependence, cigarettes, uncomplicated: Secondary | ICD-10-CM | POA: Insufficient documentation

## 2021-06-21 DIAGNOSIS — I509 Heart failure, unspecified: Secondary | ICD-10-CM | POA: Diagnosis not present

## 2021-06-21 DIAGNOSIS — E860 Dehydration: Secondary | ICD-10-CM | POA: Diagnosis not present

## 2021-06-21 DIAGNOSIS — I110 Hypertensive heart disease with heart failure: Secondary | ICD-10-CM | POA: Diagnosis not present

## 2021-06-21 DIAGNOSIS — I2510 Atherosclerotic heart disease of native coronary artery without angina pectoris: Secondary | ICD-10-CM | POA: Insufficient documentation

## 2021-06-21 DIAGNOSIS — Z794 Long term (current) use of insulin: Secondary | ICD-10-CM | POA: Insufficient documentation

## 2021-06-21 DIAGNOSIS — R1030 Lower abdominal pain, unspecified: Secondary | ICD-10-CM | POA: Diagnosis present

## 2021-06-21 DIAGNOSIS — Z7984 Long term (current) use of oral hypoglycemic drugs: Secondary | ICD-10-CM | POA: Insufficient documentation

## 2021-06-21 DIAGNOSIS — I6522 Occlusion and stenosis of left carotid artery: Secondary | ICD-10-CM

## 2021-06-21 LAB — URINALYSIS, ROUTINE W REFLEX MICROSCOPIC
Bacteria, UA: NONE SEEN
Bilirubin Urine: NEGATIVE
Glucose, UA: >=500 mg/dL — AB
Hgb urine dipstick: NEGATIVE
Ketones, ur: NEGATIVE mg/dL
Leukocytes,Ua: NEGATIVE
Nitrite: NEGATIVE
Protein, ur: 30 mg/dL — AB
Specific Gravity, Urine: 1.044 — ABNORMAL HIGH (ref 1.005–1.030)
pH: 6 (ref 5.0–8.0)

## 2021-06-21 LAB — COMPREHENSIVE METABOLIC PANEL
ALT: 68 U/L — ABNORMAL HIGH (ref 0–44)
AST: 40 U/L (ref 15–41)
Albumin: 3.8 g/dL (ref 3.5–5.0)
Alkaline Phosphatase: 109 U/L (ref 38–126)
Anion gap: 10 (ref 5–15)
BUN: 22 mg/dL — ABNORMAL HIGH (ref 6–20)
CO2: 21 mmol/L — ABNORMAL LOW (ref 22–32)
Calcium: 9 mg/dL (ref 8.9–10.3)
Chloride: 102 mmol/L (ref 98–111)
Creatinine, Ser: 1.32 mg/dL — ABNORMAL HIGH (ref 0.61–1.24)
GFR, Estimated: >60 mL/min (ref >60)
Glucose, Bld: 344 mg/dL — ABNORMAL HIGH (ref 70–99)
Potassium: 3.9 mmol/L (ref 3.5–5.1)
Sodium: 133 mmol/L — ABNORMAL LOW (ref 135–145)
Total Bilirubin: 0.2 mg/dL — ABNORMAL LOW (ref 0.3–1.2)
Total Protein: 7.3 g/dL (ref 6.5–8.1)

## 2021-06-21 LAB — CBC WITH DIFFERENTIAL/PLATELET
Abs Immature Granulocytes: 0.02 10*3/uL (ref 0.00–0.07)
Basophils Absolute: 0 10*3/uL (ref 0.0–0.1)
Basophils Relative: 1 %
Eosinophils Absolute: 0.2 10*3/uL (ref 0.0–0.5)
Eosinophils Relative: 3 %
HCT: 45.7 % (ref 39.0–52.0)
Hemoglobin: 15.2 g/dL (ref 13.0–17.0)
Immature Granulocytes: 0 %
Lymphocytes Relative: 40 %
Lymphs Abs: 3.4 10*3/uL (ref 0.7–4.0)
MCH: 28.3 pg (ref 26.0–34.0)
MCHC: 33.3 g/dL (ref 30.0–36.0)
MCV: 85.1 fL (ref 80.0–100.0)
Monocytes Absolute: 0.6 10*3/uL (ref 0.1–1.0)
Monocytes Relative: 7 %
Neutro Abs: 4.2 10*3/uL (ref 1.7–7.7)
Neutrophils Relative %: 49 %
Platelets: 272 10*3/uL (ref 150–400)
RBC: 5.37 MIL/uL (ref 4.22–5.81)
RDW: 13.8 % (ref 11.5–15.5)
WBC: 8.4 10*3/uL (ref 4.0–10.5)
nRBC: 0 % (ref 0.0–0.2)

## 2021-06-21 LAB — LIPASE, BLOOD: Lipase: 42 U/L (ref 11–51)

## 2021-06-21 LAB — CK: Total CK: 102 U/L (ref 49–397)

## 2021-06-21 MED ORDER — MORPHINE SULFATE (PF) 4 MG/ML IV SOLN
4.0000 mg | Freq: Once | INTRAVENOUS | Status: AC
  Administered 2021-06-21: 4 mg via INTRAVENOUS
  Filled 2021-06-21: qty 1

## 2021-06-21 MED ORDER — ONDANSETRON HCL 4 MG/2ML IJ SOLN
4.0000 mg | Freq: Once | INTRAMUSCULAR | Status: AC
  Administered 2021-06-21: 4 mg via INTRAVENOUS
  Filled 2021-06-21: qty 2

## 2021-06-21 MED ORDER — FENTANYL CITRATE PF 50 MCG/ML IJ SOSY
50.0000 ug | PREFILLED_SYRINGE | Freq: Once | INTRAMUSCULAR | Status: AC
  Administered 2021-06-21: 50 ug via INTRAVENOUS
  Filled 2021-06-21: qty 1

## 2021-06-21 MED ORDER — IOHEXOL 350 MG/ML SOLN
100.0000 mL | Freq: Once | INTRAVENOUS | Status: AC | PRN
Start: 2021-06-21 — End: 2021-06-21
  Administered 2021-06-21: 100 mL via INTRAVENOUS

## 2021-06-21 MED ORDER — SODIUM CHLORIDE 0.9 % IV SOLN: INTRAVENOUS | Status: DC

## 2021-06-21 MED ORDER — SODIUM CHLORIDE 0.9 % IV BOLUS
1000.0000 mL | Freq: Once | INTRAVENOUS | Status: AC
  Administered 2021-06-21: 1000 mL via INTRAVENOUS

## 2021-06-21 NOTE — ED Provider Notes (Signed)
?[facility] ?Provider Note ? ? ?CSN: [id number] ?Arrival date & time: 06/21/21  1812 ? ?  ? ?History ? ?Chief Complaint  ?Patient presents with  ? Abdominal Pain  ? ? ?Devin [name] is a 51 y.o. male. ? ? ?Abdominal Pain ?Associated symptoms: no fever   ? ?Patient has history of hypertension, hyperlipidemia, GERD, to rhabdomyolysis, asthma, diabetes, coronary artery disease, stroke, congestive heart failure migraine headaches.  Patient does have history of cholecystectomy umbilical hernia with mesh repair.  Patient states she also has history of intracerebral stents.  Patient states he has been having issues with abdominal pain for the last several days.  He has a fullness and pain in his bilateral lower abdomen.  He has had nausea vomiting and decreased appetite.  Patient also has noted some tingling on the left side of his face.  He has had recurrent headaches for the last several days.  He does feel like his balance is off compared to normal.  He denies any new weakness.  He is not having any trouble with his speech.  Patient does have some chronic left-sided weakness since his previous stroke.  Patient does continue to smoke cigarettes.  Patient has seen his cardiologist in the office on March 28 and also saw his endocrinologist on April 5 ? ?Home Medications ?Prior to Admission medications   ?Medication Sig Start Date End Date Taking? Authorizing Provider  ?aspirin 81 MG EC tablet Take 1 tablet (81 mg total) by mouth daily. With food 12/05/17  Yes Emokpae, Courage, MD  ?buPROPion (WELLBUTRIN SR) 150 MG 12 hr tablet Take 150 mg by mouth daily. 06/20/21  Yes [provider]  ?clopidogrel (PLAVIX) 75 MG tablet Take 1 tablet (75 mg total) by mouth daily. 10/15/19  Yes Johnson, Clanford L, MD  ?docusate sodium (COLACE) 100 MG capsule Take 100 mg by mouth daily.   Yes [provider]  ?DULERA 200-5 MCG/ACT AERO Inhale 2 puffs into the lungs 2 (two) times daily. 10/15/19  Yes Johnson,  Clanford L, MD  ?EUTHYROX 200 MCG tablet Take 200 mcg by mouth at bedtime. 05/13/20  Yes [provider]  ?Evolocumab (REPATHA SURECLICK) XX123456 MG/ML SOAJ Inject 1 pen into the skin every 14 (fourteen) days. 12/24/19  Yes BranchAlphonse Guild, MD  ?ezetimibe (ZETIA) 10 MG tablet Take 1 tablet (10 mg total) by mouth daily. 10/15/19  Yes Johnson, Clanford L, MD  ?famotidine (PEPCID) 40 MG tablet Take 40 mg by mouth daily. 11/23/20  Yes [provider]  ?fluticasone (FLONASE) 50 MCG/ACT nasal spray Place 2 sprays into both nostrils 2 (two) times daily. 06/24/20  Yes [provider]  ?furosemide (LASIX) 20 MG tablet TAKE 1 TABLET BY MOUTH AS NEEDED FOR  SWELLING ?Patient taking differently: Take 20 mg by mouth daily. 02/14/21  Yes BranchAlphonse Guild, MD  ?gabapentin (NEURONTIN) 300 MG capsule One po q AM, one po q PM and 2 po qHS ?Patient taking differently: Take 300 mg by mouth 2 (two) times daily. 12/23/19  Yes Sater, Nanine Means, MD  ?glipiZIDE (GLUCOTROL XL) 10 MG 24 hr tablet Take 1 tablet (10 mg total) by mouth daily. 06/15/21  Yes Nida, Marella Chimes, MD  ?Insulin Lispro Prot & Lispro (HUMALOG MIX 75/25 KWIKPEN) (75-25) 100 UNIT/ML Kwikpen Inject 80 Units into the skin 2 (two) times daily with a meal. 06/15/21  Yes Nida, Marella Chimes, MD  ?levothyroxine (SYNTHROID) 88 MCG tablet Take 88 mcg by mouth daily. 01/25/21  Yes  [provider]  ?nitroGLYCERIN (NITROSTAT) 0.4 MG SL tablet Place 1 tablet (0.4 mg total) under the tongue every 5 (five) minutes as needed for chest pain. 02/14/21  Yes BranchAlphonse Guild, MD  ?ondansetron (ZOFRAN-ODT) 4 MG disintegrating tablet Take 1 tablet (4 mg total) by mouth every 8 (eight) hours as needed for nausea or vomiting. 06/22/21  Yes Maudie Flakes, MD  ?pantoprazole (PROTONIX) 40 MG tablet TAKE 1 TABLET BY MOUTH ONCE DAILY - NEED TO SCHEDULE AN APPOINTMENT ?Patient taking differently: Take 40 mg by mouth daily. 02/14/21  Yes BranchAlphonse Guild, MD   ?sitaGLIPtin (JANUVIA) 50 MG tablet Take 1 tablet (50 mg total) by mouth daily. 06/15/21  Yes Nida, Marella Chimes, MD  ?VASCEPA 1 g capsule Take 2 capsules (2 g total) by mouth 2 (two) times daily. 03/11/21  Yes Branch, Alphonse Guild, MD  ?VENTOLIN HFA 108 (90 Base) MCG/ACT inhaler Inhale into the lungs. 11/23/20  Yes [provider]  ?Continuous Blood Gluc Receiver (FREESTYLE LIBRE 2 READER) DEVI As directed 11/11/20   Cassandria Anger, MD  ?Continuous Blood Gluc Sensor (FREESTYLE LIBRE 2 SENSOR) MISC APPLY 1 PATCH TO SKIN EVERY 14 DAYS 03/28/21   Cassandria Anger, MD  ?fenofibrate (TRICOR) 145 MG tablet Take 1 tablet (145 mg total) by mouth daily. ?Patient not taking: Reported on 06/21/2021 10/15/19   Murlean Iba, MD  ?glucose blood (ACCU-CHEK GUIDE) test strip Test glucose 2 times a day 08/10/20   Cassandria Anger, MD  ?Insulin Pen Needle (B-D ULTRAFINE III SHORT PEN) 31G X 8 MM MISC Use as directed to inject insulin 2 times daily. 05/02/21   Cassandria Anger, MD  ?methocarbamol (ROBAXIN) 500 MG tablet Take 1 tablet (500 mg total) by mouth every 8 (eight) hours as needed for muscle spasms. ?Patient not taking: Reported on 06/21/2021 04/16/21   Maudie Flakes, MD  ?   ? ?Allergies    ?Crestor [rosuvastatin], Trulicity [dulaglutide], and Bee venom   ? ?Review of Systems   ?Review of Systems  ?Constitutional:  Negative for fever.  ?Gastrointestinal:  Positive for abdominal pain.  ? ?Physical Exam ?Updated Vital Signs ?BP 133/75   Pulse 73   Temp 98.4 ?F (36.9 ?C) (Oral)   Resp (!) 22   Ht 1.981 m (6\' 6" )   Wt 122.5 kg   SpO2 96%   BMI 31.20 kg/m?  ?Physical Exam ?Vitals and nursing note reviewed.  ?Constitutional:   ?   General: He is not in acute distress. ?   Appearance: He is well-developed.  ?HENT:  ?   Head: Normocephalic and atraumatic.  ?   Right Ear: External ear normal.  ?   Left Ear: External ear normal.  ?Eyes:  ?   General: No scleral icterus.    ?   Right eye: No  discharge.     ?   Left eye: No discharge.  ?   Conjunctiva/sclera: Conjunctivae normal.  ?Neck:  ?   Trachea: No tracheal deviation.  ?Cardiovascular:  ?   Rate and Rhythm: Normal rate and regular rhythm.  ?Pulmonary:  ?   Effort: Pulmonary effort is normal. No respiratory distress.  ?   Breath sounds: Normal breath sounds. No stridor. No wheezing or rales.  ?Abdominal:  ?   General: Bowel sounds are normal. There is no distension.  ?   Palpations: Abdomen is soft.  ?   Tenderness: There is abdominal tenderness in the right lower quadrant, suprapubic area  and left lower quadrant. There is no guarding or rebound.  ?Musculoskeletal:     ?   General: No tenderness.  ?   Cervical back: Neck supple.  ?Skin: ?   General: Skin is warm and dry.  ?   Findings: No rash.  ?Neurological:  ?   Mental Status: He is alert and oriented to person, place, and time.  ?   Cranial Nerves: No cranial nerve deficit.  ?   Sensory: No sensory deficit.  ?   Motor: No abnormal muscle tone or seizure activity.  ?   Coordination: Coordination normal.  ?   Comments: No pronator drift bilateral upper extrem, able to hold both legs off bed for 5 seconds, sensation intact in all extremities, no visual field cuts, no left or right sided neglect, normal finger-nose exam bilaterally, no nystagmus noted ? ?No facial droop, extraocular movements intact, tongue midline  ? ? ?ED Results / Procedures / Treatments   ?Labs ?(all labs ordered are listed, but only abnormal results are displayed) ?Labs Reviewed  ?COMPREHENSIVE METABOLIC PANEL - Abnormal; Notable for the following components:  ?    Result Value  ? Sodium 133 (*)   ? CO2 21 (*)   ? Glucose, Bld 344 (*)   ? BUN 22 (*)   ? Creatinine, Ser 1.32 (*)   ? ALT 68 (*)   ? Total Bilirubin 0.2 (*)   ? All other components within normal limits  ?URINALYSIS, ROUTINE W REFLEX MICROSCOPIC - Abnormal; Notable for the following components:  ? Specific Gravity, Urine 1.044 (*)   ? Glucose, UA >=500 (*)   ?  Protein, ur 30 (*)   ? All other components within normal limits  ?LIPASE, BLOOD  ?CBC WITH DIFFERENTIAL/PLATELET  ?CK  ? ? ?EKG ?None ? ?Radiology ?CT ANGIO HEAD NECK W WO CM ? ?Result Date: 06/21/2021 ?CLINICAL DATA:  Initial eva

## 2021-06-21 NOTE — ED Triage Notes (Signed)
Abdominal pain with tingling for the past 3 days ?

## 2021-06-21 NOTE — ED Provider Notes (Signed)
?  Provider Note ?MRN:  [id number]  ?Arrival date & time: 06/22/21    ?ED Course and Medical Decision Making  ?Assumed care from Dr. Lynelle Doctor at shift change. ? ?Multiple complaints, headache, abdominal pain, work-up overall reassuring and patient feeling better.  Having some nausea vomiting recently but tolerating p.o. at this time.  Awaiting urinalysis, anticipating discharge. ? ?Procedures ? ?Final Clinical Impressions(s) / ED Diagnoses  ? ?  ICD-10-CM   ?1. Dehydration  E86.0   ?  ?2. 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  ?  ?ED Discharge Orders   ? ?      Ordered  ?  ondansetron (ZOFRAN-ODT) 4 MG disintegrating tablet  Every 8 hours PRN       ? 06/22/21 0047  ? ?  ?  ? ?  ?  ? ? ?Discharge Instructions   ? ?  ?You were evaluated in the Emergency Department and after careful evaluation, we did not find any emergent condition requiring admission or further testing in the hospital. ? ?Your exam/testing today is overall reassuring.  Recommend follow-up with your regular doctors, can use the Zofran as needed for nausea. ? ?Please return to the Emergency Department if you experience any worsening of your condition.   Thank you for allowing [name] to be a part of your care. ? ? ? ? ? ?Elmer Sow. Pilar Plate, MD ?[facility] Emergency Medicine ?[facility] [email or web address] ? ?  ?Sabas Sous, MD ?06/22/21 [phone] ? ?

## 2021-06-22 DIAGNOSIS — E860 Dehydration: Secondary | ICD-10-CM | POA: Diagnosis not present

## 2021-06-22 MED ORDER — ONDANSETRON 4 MG PO TBDP: 4.0000 mg | ORAL_TABLET | Freq: Three times a day (TID) | ORAL | 0 refills | Status: DC | PRN

## 2021-06-22 NOTE — Discharge Instructions (Addendum)
You were evaluated in the Emergency Department and after careful evaluation, we did not find any emergent condition requiring admission or further testing in the hospital. ? ?Your exam/testing today is overall reassuring.  Recommend follow-up with your regular doctors, can use the Zofran as needed for nausea. ? ?Please return to the Emergency Department if you experience any worsening of your condition.   Thank you for allowing [name] to be a part of your care. ?

## 2021-06-28 ENCOUNTER — Other Ambulatory Visit (HOSPITAL_COMMUNITY): Payer: Self-pay | Admitting: Interventional Radiology

## 2021-06-28 ENCOUNTER — Telehealth (HOSPITAL_COMMUNITY): Payer: Self-pay

## 2021-06-28 DIAGNOSIS — I771 Stricture of artery: Secondary | ICD-10-CM

## 2021-06-28 NOTE — Telephone Encounter (Signed)
Called to scheduled consult to discuss recent imaging, no answer, left vm. AW ?

## 2021-07-13 ENCOUNTER — Encounter: Payer: Self-pay | Admitting: "Endocrinology

## 2021-07-13 ENCOUNTER — Ambulatory Visit (INDEPENDENT_AMBULATORY_CARE_PROVIDER_SITE_OTHER): Payer: Medicare Other | Admitting: "Endocrinology

## 2021-07-13 VITALS — BP 118/82 | HR 88 | Ht 78.0 in | Wt 269.8 lb

## 2021-07-13 DIAGNOSIS — E1159 Type 2 diabetes mellitus with other circulatory complications: Secondary | ICD-10-CM | POA: Diagnosis not present

## 2021-07-13 DIAGNOSIS — E039 Hypothyroidism, unspecified: Secondary | ICD-10-CM

## 2021-07-13 DIAGNOSIS — E782 Mixed hyperlipidemia: Secondary | ICD-10-CM

## 2021-07-13 DIAGNOSIS — I10 Essential (primary) hypertension: Secondary | ICD-10-CM

## 2021-07-13 MED ORDER — INSULIN LISPRO PROT & LISPRO (75-25 MIX) 100 UNIT/ML KWIKPEN: 100.0000 [IU] | PEN_INJECTOR | Freq: Two times a day (BID) | SUBCUTANEOUS | 2 refills | Status: DC

## 2021-07-13 NOTE — Progress Notes (Signed)
? ?                                                   ?     07/13/2021, 4:28 PM ? ?Endocrinology follow-up note ? ? ?Subjective:  ? ? Patient ID: Devin [name], male    DOB: 19-Mar-1970.  ?Devin [name] is being seen in follow-up after he was seen in consultation for management of currently uncontrolled symptomatic diabetes requested by  Timblin Nation, MD. ?  ? ?Past Medical History:  ?Diagnosis Date  ? Asthma   ? Chest pain   ? CHF (congestive heart failure) ([place])   ? Coronary artery disease   ? a. s/p DES x2 to LAD and DES to OM in 11/2017  ? Depression   ? Dyspnea   ? GERD (gastroesophageal reflux disease)   ? Headache   ? History of kidney stones   ? History of rhabdomyolysis   ? Hyperlipidemia   ? Hypertension   ? Hypothyroidism   ? Ischemic cardiomyopathy   ? a. 11/2017: echo showing EF of 35-40%, diffuse HK, and Grade 2 DD  ? Kidney calculus 2014  ? Pericardial effusion   ? Small, by dobutamine echocardiogram, 04/2006  ? PONV (postoperative nausea and vomiting)   ? Sleep apnea   ? does not wear cpap  ? Stroke [facility])   ? Tobacco abuse   ? Type 2 diabetes mellitus without complications ([place]) [id number]  ? Vitamin D deficiency   ? ? ?Past Surgical History:  ?Procedure Laterality Date  ? APPENDECTOMY    ? CARDIAC CATHETERIZATION  09/2012  ? "Nonobstructive CAD with 30% proximal, 40% mid LAD disease; 30% proximal CFX; EF 55-65%"  ? CHOLECYSTECTOMY N/A 01/27/2013  ? Procedure: LAPAROSCOPIC CHOLECYSTECTOMY;  Surgeon: Jamesetta So, MD;  Location: [facility];  Service: General;  Laterality: N/A;  ? CORONARY STENT INTERVENTION N/A 11/15/2017  ? Procedure: CORONARY STENT INTERVENTION;  Surgeon: Leonie Man, MD;  Location: [facility];  Service: Cardiovascular;  Laterality: N/A;  ? HERNIA REPAIR    ? As a child  ? INSERTION OF MESH N/A 11/13/2012  ? Procedure: INSERTION OF MESH;  Surgeon: Jamesetta So, MD;  Location: [facility];  Service: General;  Laterality: N/A;  ? IR ANGIO INTRA EXTRACRAN SEL  COM CAROTID INNOMINATE BILAT MOD SED  12/01/2017  ? IR ANGIO INTRA EXTRACRAN SEL COM CAROTID INNOMINATE BILAT MOD SED  02/13/2018  ? IR ANGIO VERTEBRAL SEL SUBCLAVIAN INNOMINATE UNI L MOD SED  02/13/2018  ? IR ANGIO VERTEBRAL SEL VERTEBRAL BILAT MOD SED  12/01/2017  ? IR ANGIO VERTEBRAL SEL VERTEBRAL UNI R MOD SED  02/13/2018  ? IR INTRA CRAN STENT  12/03/2017  ? IR PTA INTRACRANIAL  02/25/2018  ? IR US GUIDE VASC ACCESS RIGHT  02/13/2018  ? KIDNEY STONE SURGERY    ? LEFT HEART CATH AND CORONARY ANGIOGRAPHY N/A 11/15/2017  ? Procedure: LEFT HEART CATH AND CORONARY ANGIOGRAPHY;  Surgeon: Leonie Man, MD;  Location: [facility];  Service: Cardiovascular;  Laterality: N/A;  ? PERCUTANEOUS NEPHROLITHOTRIPSY    ? RADIOLOGY WITH ANESTHESIA Left 12/03/2017  ? Procedure: Angioplasty with possible stenting of left VBJ;  Surgeon: Luanne Bras, MD;  Location: [facility];  Service: Radiology;  Laterality: Left;  ? RADIOLOGY WITH ANESTHESIA N/A 02/25/2018  ?  Procedure: STENT PLACEMENT;  Surgeon: Luanne Bras, MD;  Location: [facility];  Service: Radiology;  Laterality: N/A;  ? UMBILICAL HERNIA REPAIR N/A 11/13/2012  ? Procedure: UMBILICAL HERNIORRHAPHY;  Surgeon: Jamesetta So, MD;  Location: [facility];  Service: General;  Laterality: N/A;  ? VARICOCELECTOMY    ? ? ?Social History  ? ?Socioeconomic History  ? Marital status: Divorced  ?  Spouse name: Not on file  ? Number of children: Not on file  ? Years of education: Not on file  ? Highest education level: Not on file  ?Occupational History  ? Occupation: Disability   ?  Comment:    ?  Employer: [employer]  ?Tobacco Use  ? Smoking status: Every Day  ?  Packs/day: 1.00  ?  Years: 26.00  ?  Pack years: 26.00  ?  Types: Cigarettes  ?  Start date: 03/13/1985  ? Smokeless tobacco: Former  ?  Types: Snuff, Chew  ?  Quit date: 03/14/1987  ?Vaping Use  ? Vaping Use: Former  ? Devices: quit in 2011  ?Substance and Sexual Activity  ? Alcohol use: No  ? Drug use: No  ? Sexual activity: Not  on file  ?Other Topics Concern  ? Not on file  ?Social History Narrative  ? Eats fast food daily  ? No exercise outside of work  ? Lives in [place] with his dog  ? MAINTENANCE TECHNICIAN  ? Caffeine use: tea/soda daily  ? Left handed   ? ?Social Determinants of Health  ? ?Financial Resource Strain: Not on file  ?Food Insecurity: Not on file  ?Transportation Needs: Not on file  ?Physical Activity: Not on file  ?Stress: Not on file  ?Social Connections: Not on file  ? ? ?Family History  ?Problem Relation Age of Onset  ? Coronary artery disease Father   ? Hypertension Father   ? Hyperlipidemia Father   ? Diabetes Father   ? Congestive Heart Failure Father 3  ? Arrhythmia Father   ?     had an ICD  ? Diabetes Mother   ? Hypertension Mother   ? Hyperlipidemia Mother   ? Obesity Mother 19  ?     died after bariatric surgery, liver failure and infection  ? Coronary artery disease Maternal Grandfather   ?     both grandfathers and several uncles  ? Coronary artery disease Other   ? Diabetes Sister   ?     both sisters  ? Stroke Maternal Aunt   ? Stroke Maternal Uncle   ? ? ?Outpatient Encounter Medications as of 07/13/2021  ?Medication Sig  ? aspirin 81 MG EC tablet Take 1 tablet (81 mg total) by mouth daily. With food  ? buPROPion (WELLBUTRIN SR) 150 MG 12 hr tablet Take 150 mg by mouth daily.  ? clopidogrel (PLAVIX) 75 MG tablet Take 1 tablet (75 mg total) by mouth daily.  ? Continuous Blood Gluc Receiver (FREESTYLE LIBRE 2 READER) DEVI As directed  ? Continuous Blood Gluc Sensor (FREESTYLE LIBRE 2 SENSOR) MISC APPLY 1 PATCH TO SKIN EVERY 14 DAYS  ? docusate sodium (COLACE) 100 MG capsule Take 100 mg by mouth daily.  ? DULERA 200-5 MCG/ACT AERO Inhale 2 puffs into the lungs 2 (two) times daily.  ? EUTHYROX 200 MCG tablet Take 200 mcg by mouth at bedtime.  ? Evolocumab (REPATHA SURECLICK) [id number] MG/ML SOAJ Inject 1 pen into the skin every 14 (fourteen) days.  ? ezetimibe (ZETIA) 10 MG tablet Take  1 tablet (10 mg total) by mouth  daily.  ? famotidine (PEPCID) 40 MG tablet Take 40 mg by mouth daily.  ? fenofibrate (TRICOR) 145 MG tablet Take 1 tablet (145 mg total) by mouth daily. (Patient not taking: Reported on 06/21/2021)  ? fluticasone (FLONASE) 50 MCG/ACT nasal spray Place 2 sprays into both nostrils 2 (two) times daily.  ? furosemide (LASIX) 20 MG tablet TAKE 1 TABLET BY MOUTH AS NEEDED FOR  SWELLING (Patient taking differently: Take 20 mg by mouth daily.)  ? gabapentin (NEURONTIN) 300 MG capsule One po q AM, one po q PM and 2 po qHS (Patient taking differently: Take 300 mg by mouth 2 (two) times daily.)  ? glipiZIDE (GLUCOTROL XL) 10 MG 24 hr tablet Take 1 tablet (10 mg total) by mouth daily.  ? glucose blood (ACCU-CHEK GUIDE) test strip Test glucose 2 times a day  ? Insulin Lispro Prot & Lispro (HUMALOG MIX 75/25 KWIKPEN) (75-25) 100 UNIT/ML Kwikpen Inject 100 Units into the skin 2 (two) times daily with a meal.  ? Insulin Pen Needle (B-D ULTRAFINE III SHORT PEN) 31G X 8 MM MISC Use as directed to inject insulin 2 times daily.  ? levothyroxine (SYNTHROID) 88 MCG tablet Take 88 mcg by mouth daily.  ? methocarbamol (ROBAXIN) 500 MG tablet Take 1 tablet (500 mg total) by mouth every 8 (eight) hours as needed for muscle spasms. (Patient not taking: Reported on 06/21/2021)  ? nitroGLYCERIN (NITROSTAT) 0.4 MG SL tablet Place 1 tablet (0.4 mg total) under the tongue every 5 (five) minutes as needed for chest pain.  ? ondansetron (ZOFRAN-ODT) 4 MG disintegrating tablet Take 1 tablet (4 mg total) by mouth every 8 (eight) hours as needed for nausea or vomiting.  ? pantoprazole (PROTONIX) 40 MG tablet TAKE 1 TABLET BY MOUTH ONCE DAILY - NEED TO SCHEDULE AN APPOINTMENT (Patient taking differently: Take 40 mg by mouth daily.)  ? sitaGLIPtin (JANUVIA) 50 MG tablet Take 1 tablet (50 mg total) by mouth daily.  ? VASCEPA 1 g capsule Take 2 capsules (2 g total) by mouth 2 (two) times daily.  ? VENTOLIN HFA 108 (90 Base) MCG/ACT inhaler Inhale into the  lungs.  ? [DISCONTINUED] Insulin Lispro Prot & Lispro (HUMALOG MIX 75/25 KWIKPEN) (75-25) 100 UNIT/ML Kwikpen Inject 80 Units into the skin 2 (two) times daily with a meal.  ? ?No facility-administered en

## 2021-07-13 NOTE — Patient Instructions (Signed)

## 2021-07-28 ENCOUNTER — Ambulatory Visit (HOSPITAL_COMMUNITY)
Admission: RE | Admit: 2021-07-28 | Discharge: 2021-07-28 | Disposition: A | Discharge status: Home / Self Care | Payer: Medicare Other | Source: Ambulatory Visit | Attending: Interventional Radiology | Admitting: Interventional Radiology

## 2021-07-28 DIAGNOSIS — I6522 Occlusion and stenosis of left carotid artery: Secondary | ICD-10-CM | POA: Diagnosis not present

## 2021-07-28 DIAGNOSIS — I771 Stricture of artery: Secondary | ICD-10-CM

## 2021-07-29 HISTORY — PX: IR RADIOLOGIST EVAL & MGMT: IMG5224

## 2021-08-10 ENCOUNTER — Encounter: Payer: Self-pay | Admitting: Internal Medicine

## 2021-08-10 ENCOUNTER — Telehealth: Payer: Self-pay

## 2021-08-10 NOTE — Telephone Encounter (Signed)
Repatha Sureclick 140 mg/ml Solution Auto-Injector has been approved for prior authorization. The coverage starts 05/11/21 and will end 08/10/22.  Please call [phone] with any questions.

## 2021-08-16 DIAGNOSIS — I10 Essential (primary) hypertension: Secondary | ICD-10-CM | POA: Insufficient documentation

## 2021-08-16 DIAGNOSIS — Z87898 Personal history of other specified conditions: Secondary | ICD-10-CM | POA: Insufficient documentation

## 2021-08-17 ENCOUNTER — Other Ambulatory Visit (HOSPITAL_COMMUNITY): Payer: Self-pay | Admitting: Nephrology

## 2021-08-17 ENCOUNTER — Other Ambulatory Visit: Payer: Self-pay | Admitting: Nephrology

## 2021-08-17 DIAGNOSIS — E559 Vitamin D deficiency, unspecified: Secondary | ICD-10-CM | POA: Diagnosis not present

## 2021-08-17 DIAGNOSIS — Z716 Tobacco abuse counseling: Secondary | ICD-10-CM | POA: Diagnosis not present

## 2021-08-17 DIAGNOSIS — K760 Fatty (change of) liver, not elsewhere classified: Secondary | ICD-10-CM | POA: Diagnosis not present

## 2021-08-17 DIAGNOSIS — E871 Hypo-osmolality and hyponatremia: Secondary | ICD-10-CM | POA: Diagnosis not present

## 2021-08-17 DIAGNOSIS — I129 Hypertensive chronic kidney disease with stage 1 through stage 4 chronic kidney disease, or unspecified chronic kidney disease: Secondary | ICD-10-CM

## 2021-08-17 DIAGNOSIS — E1122 Type 2 diabetes mellitus with diabetic chronic kidney disease: Secondary | ICD-10-CM

## 2021-08-17 DIAGNOSIS — N200 Calculus of kidney: Secondary | ICD-10-CM | POA: Diagnosis not present

## 2021-08-17 DIAGNOSIS — I639 Cerebral infarction, unspecified: Secondary | ICD-10-CM | POA: Diagnosis not present

## 2021-08-17 DIAGNOSIS — I2510 Atherosclerotic heart disease of native coronary artery without angina pectoris: Secondary | ICD-10-CM | POA: Diagnosis not present

## 2021-08-17 DIAGNOSIS — N189 Chronic kidney disease, unspecified: Secondary | ICD-10-CM | POA: Diagnosis not present

## 2021-08-23 ENCOUNTER — Ambulatory Visit (HOSPITAL_COMMUNITY)
Admission: RE | Admit: 2021-08-23 | Discharge: 2021-08-23 | Disposition: A | Discharge status: Home / Self Care | Payer: Medicare Other | Source: Ambulatory Visit | Attending: Nephrology | Admitting: Nephrology

## 2021-08-23 DIAGNOSIS — N200 Calculus of kidney: Secondary | ICD-10-CM | POA: Diagnosis present

## 2021-08-23 DIAGNOSIS — E1122 Type 2 diabetes mellitus with diabetic chronic kidney disease: Secondary | ICD-10-CM | POA: Insufficient documentation

## 2021-08-23 DIAGNOSIS — I129 Hypertensive chronic kidney disease with stage 1 through stage 4 chronic kidney disease, or unspecified chronic kidney disease: Secondary | ICD-10-CM | POA: Diagnosis present

## 2021-08-25 ENCOUNTER — Ambulatory Visit: Payer: Medicare Other | Admitting: Internal Medicine

## 2021-09-01 ENCOUNTER — Other Ambulatory Visit: Payer: Self-pay | Admitting: "Endocrinology

## 2021-09-16 ENCOUNTER — Other Ambulatory Visit: Payer: Self-pay

## 2021-09-16 ENCOUNTER — Encounter (HOSPITAL_COMMUNITY): Payer: Self-pay | Admitting: Emergency Medicine

## 2021-09-16 ENCOUNTER — Emergency Department (HOSPITAL_COMMUNITY)
Admission: EM | Admit: 2021-09-16 | Discharge: 2021-09-16 | Disposition: A | Discharge status: Home / Self Care | Payer: Medicare Other | Attending: Emergency Medicine | Admitting: Emergency Medicine

## 2021-09-16 DIAGNOSIS — I2510 Atherosclerotic heart disease of native coronary artery without angina pectoris: Secondary | ICD-10-CM | POA: Diagnosis not present

## 2021-09-16 DIAGNOSIS — Z7984 Long term (current) use of oral hypoglycemic drugs: Secondary | ICD-10-CM | POA: Insufficient documentation

## 2021-09-16 DIAGNOSIS — K625 Hemorrhage of anus and rectum: Secondary | ICD-10-CM | POA: Insufficient documentation

## 2021-09-16 DIAGNOSIS — Z7982 Long term (current) use of aspirin: Secondary | ICD-10-CM | POA: Diagnosis not present

## 2021-09-16 DIAGNOSIS — E119 Type 2 diabetes mellitus without complications: Secondary | ICD-10-CM | POA: Insufficient documentation

## 2021-09-16 DIAGNOSIS — Z794 Long term (current) use of insulin: Secondary | ICD-10-CM | POA: Insufficient documentation

## 2021-09-16 LAB — ABO/RH: ABO/RH(D): O POS

## 2021-09-16 LAB — TYPE AND SCREEN
ABO/RH(D): O POS
Antibody Screen: NEGATIVE

## 2021-09-16 LAB — COMPREHENSIVE METABOLIC PANEL
ALT: 46 U/L — ABNORMAL HIGH (ref 0–44)
AST: 26 U/L (ref 15–41)
Albumin: 4 g/dL (ref 3.5–5.0)
Alkaline Phosphatase: 166 U/L — ABNORMAL HIGH (ref 38–126)
Anion gap: 10 (ref 5–15)
BUN: 17 mg/dL (ref 6–20)
CO2: 27 mmol/L (ref 22–32)
Calcium: 9.6 mg/dL (ref 8.9–10.3)
Chloride: 96 mmol/L — ABNORMAL LOW (ref 98–111)
Creatinine, Ser: 1.01 mg/dL (ref 0.61–1.24)
GFR, Estimated: >60 mL/min (ref >60)
Glucose, Bld: 499 mg/dL — ABNORMAL HIGH (ref 70–99)
Potassium: 3.6 mmol/L (ref 3.5–5.1)
Sodium: 133 mmol/L — ABNORMAL LOW (ref 135–145)
Total Bilirubin: 0.4 mg/dL (ref 0.3–1.2)
Total Protein: 8.2 g/dL — ABNORMAL HIGH (ref 6.5–8.1)

## 2021-09-16 LAB — CBC WITH DIFFERENTIAL/PLATELET
Abs Immature Granulocytes: 0.04 10*3/uL (ref 0.00–0.07)
Basophils Absolute: 0 10*3/uL (ref 0.0–0.1)
Basophils Relative: 1 %
Eosinophils Absolute: 0.3 10*3/uL (ref 0.0–0.5)
Eosinophils Relative: 3 %
HCT: 50.8 % (ref 39.0–52.0)
Hemoglobin: 17 g/dL (ref 13.0–17.0)
Immature Granulocytes: 1 %
Lymphocytes Relative: 29 %
Lymphs Abs: 2.5 10*3/uL (ref 0.7–4.0)
MCH: 28.4 pg (ref 26.0–34.0)
MCHC: 33.5 g/dL (ref 30.0–36.0)
MCV: 84.8 fL (ref 80.0–100.0)
Monocytes Absolute: 0.6 10*3/uL (ref 0.1–1.0)
Monocytes Relative: 7 %
Neutro Abs: 5.2 10*3/uL (ref 1.7–7.7)
Neutrophils Relative %: 59 %
Platelets: 266 10*3/uL (ref 150–400)
RBC: 5.99 MIL/uL — ABNORMAL HIGH (ref 4.22–5.81)
RDW: 14 % (ref 11.5–15.5)
WBC: 8.6 10*3/uL (ref 4.0–10.5)
nRBC: 0 % (ref 0.0–0.2)

## 2021-09-16 MED ORDER — INSULIN ASPART 100 UNIT/ML IJ SOLN
5.0000 [IU] | Freq: Once | INTRAMUSCULAR | Status: AC
  Administered 2021-09-16: 5 [IU] via SUBCUTANEOUS
  Filled 2021-09-16: qty 1

## 2021-09-16 NOTE — Discharge Instructions (Addendum)
Follow up with Dr. Levon Hedger or one of his colleagues next week.  Return sooner if any problems.

## 2021-09-16 NOTE — ED Triage Notes (Signed)
Pt reports passing bright red blood from his rectum. States had same approx 2 weeks ago, cleared up and started again late last night. Denies pain. States when he sits on toilet he may have "8-10 drops in the water" but if he has a BM "it looks like a murder"

## 2021-09-16 NOTE — ED Provider Notes (Signed)
[facility] EMERGENCY DEPARTMENT Provider Note   CSN: [id number] Arrival date & time: 09/16/21  [id number]     History  Chief Complaint  Patient presents with   Rectal Bleeding    Devin [name] is a 51 y.o. male.  Patient complains of bright red blood per rectum.  Patient has a history of hemorrhoids.  He also takes Plavix for coronary artery disease and history of strokes.  The history is provided by the patient and medical records. No language interpreter was used.  Rectal Bleeding Quality:  Bright red Amount:  Scant Timing:  Intermittent Chronicity:  New Context: not anal penetration   Similar prior episodes: no   Relieved by:  Nothing Worsened by:  Nothing Ineffective treatments:  None tried Associated symptoms: no abdominal pain        Home Medications Prior to Admission medications   Medication Sig Start Date End Date Taking? Authorizing Provider  aspirin 81 MG EC tablet Take 1 tablet (81 mg total) by mouth daily. With food 12/05/17   Roxan Hockey, MD  buPROPion (WELLBUTRIN SR) 150 MG 12 hr tablet Take 150 mg by mouth daily. 06/20/21   [provider]  clopidogrel (PLAVIX) 75 MG tablet Take 1 tablet (75 mg total) by mouth daily. 10/15/19   Murlean Iba, MD  Continuous Blood Gluc Receiver (FREESTYLE LIBRE 2 READER) DEVI USE AS DIRECTED 09/02/21   Cassandria Anger, MD  Continuous Blood Gluc Sensor (FREESTYLE LIBRE 2 SENSOR) MISC APPLY 1 PATCH TO SKIN EVERY 14 DAYS 09/02/21   Cassandria Anger, MD  docusate sodium (COLACE) 100 MG capsule Take 100 mg by mouth daily.    [provider]  DULERA 200-5 MCG/ACT AERO Inhale 2 puffs into the lungs 2 (two) times daily. 10/15/19   Johnson, Clanford L, MD  EUTHYROX 200 MCG tablet Take 200 mcg by mouth at bedtime. 05/13/20   [provider]  Evolocumab (REPATHA SURECLICK) [id number] MG/ML SOAJ Inject 1 pen into the skin every 14 (fourteen) days. 12/24/19   Arnoldo Lenis, MD  ezetimibe (ZETIA) 10 MG  tablet Take 1 tablet (10 mg total) by mouth daily. 10/15/19   Johnson, Clanford L, MD  famotidine (PEPCID) 40 MG tablet Take 40 mg by mouth daily. 11/23/20   [provider]  fenofibrate (TRICOR) 145 MG tablet Take 1 tablet (145 mg total) by mouth daily. Patient not taking: Reported on 06/21/2021 10/15/19   Murlean Iba, MD  fluticasone (FLONASE) 50 MCG/ACT nasal spray Place 2 sprays into both nostrils 2 (two) times daily. 06/24/20   [provider]  furosemide (LASIX) 20 MG tablet TAKE 1 TABLET BY MOUTH AS NEEDED FOR  SWELLING Patient taking differently: Take 20 mg by mouth daily. 02/14/21   Arnoldo Lenis, MD  gabapentin (NEURONTIN) 300 MG capsule One po q AM, one po q PM and 2 po qHS Patient taking differently: Take 300 mg by mouth 2 (two) times daily. 12/23/19   Sater, Nanine Means, MD  glipiZIDE (GLUCOTROL XL) 10 MG 24 hr tablet Take 1 tablet (10 mg total) by mouth daily. 06/15/21   Cassandria Anger, MD  glucose blood (ACCU-CHEK GUIDE) test strip Test glucose 2 times a day 08/10/20   Cassandria Anger, MD  Insulin Lispro Prot & Lispro (HUMALOG MIX 75/25 KWIKPEN) (75-25) 100 UNIT/ML Kwikpen Inject 100 Units into the skin 2 (two) times daily with a meal. 07/13/21   Nida, Marella Chimes, MD  Insulin Pen Needle (B-D ULTRAFINE III  SHORT PEN) 31G X 8 MM MISC Use as directed to inject insulin 2 times daily. 05/02/21   Roma Kayser, MD  levothyroxine (SYNTHROID) 88 MCG tablet Take 88 mcg by mouth daily. 01/25/21   [provider]  methocarbamol (ROBAXIN) 500 MG tablet Take 1 tablet (500 mg total) by mouth every 8 (eight) hours as needed for muscle spasms. Patient not taking: Reported on 06/21/2021 04/16/21   Sabas Sous, MD  nitroGLYCERIN (NITROSTAT) 0.4 MG SL tablet Place 1 tablet (0.4 mg total) under the tongue every 5 (five) minutes as needed for chest pain. 02/14/21   Antoine Poche, MD  ondansetron (ZOFRAN-ODT) 4 MG disintegrating tablet Take 1 tablet (4  mg total) by mouth every 8 (eight) hours as needed for nausea or vomiting. 06/22/21   Sabas Sous, MD  pantoprazole (PROTONIX) 40 MG tablet TAKE 1 TABLET BY MOUTH ONCE DAILY - NEED TO SCHEDULE AN APPOINTMENT Patient taking differently: Take 40 mg by mouth daily. 02/14/21   Antoine Poche, MD  sitaGLIPtin (JANUVIA) 50 MG tablet Take 1 tablet (50 mg total) by mouth daily. 06/15/21   Roma Kayser, MD  VASCEPA 1 g capsule Take 2 capsules (2 g total) by mouth 2 (two) times daily. 03/11/21   Antoine Poche, MD  VENTOLIN HFA 108 [phone] Base) MCG/ACT inhaler Inhale into the lungs. 11/23/20   [provider]      Allergies    Crestor [rosuvastatin], Trulicity [dulaglutide], and Bee venom    Review of Systems   Review of Systems  Constitutional:  Negative for appetite change and fatigue.  HENT:  Negative for congestion, ear discharge and sinus pressure.   Eyes:  Negative for discharge.  Respiratory:  Negative for cough.   Cardiovascular:  Negative for chest pain.  Gastrointestinal:  Positive for hematochezia. Negative for abdominal pain and diarrhea.  Genitourinary:  Negative for frequency and hematuria.       Rectal bleeding  Musculoskeletal:  Negative for back pain.  Skin:  Negative for rash.  Neurological:  Negative for seizures and headaches.  Psychiatric/Behavioral:  Negative for hallucinations.     Physical Exam Updated Vital Signs BP (!) 150/89 (BP Location: Left Arm)   Pulse 77   Temp 97.7 F (36.5 C) (Oral)   Resp 20   Ht 6\' 6"  (1.981 m)   Wt 125.2 kg   SpO2 95%   BMI 31.90 kg/m  Physical Exam Vitals and nursing note reviewed.  Constitutional:      Appearance: He is well-developed.  HENT:     Head: Normocephalic.     Nose: Nose normal.  Eyes:     General: No scleral icterus.    Conjunctiva/sclera: Conjunctivae normal.  Neck:     Thyroid: No thyromegaly.  Cardiovascular:     Rate and Rhythm: Normal rate and regular rhythm.     Heart sounds: No  murmur heard.    No friction rub. No gallop.  Pulmonary:     Breath sounds: No stridor. No wheezing or rales.  Chest:     Chest wall: No tenderness.  Abdominal:     General: There is no distension.     Tenderness: There is no abdominal tenderness. There is no rebound.  Genitourinary:    Comments: Heme positive stool which is red Musculoskeletal:        General: Normal range of motion.     Cervical back: Neck supple.  Lymphadenopathy:     Cervical: No cervical adenopathy.  Skin:    Findings: No erythema or rash.  Neurological:     Mental Status: He is alert and oriented to person, place, and time.     Motor: No abnormal muscle tone.     Coordination: Coordination normal.  Psychiatric:        Behavior: Behavior normal.     ED Results / Procedures / Treatments   Labs (all labs ordered are listed, but only abnormal results are displayed) Labs Reviewed  CBC WITH DIFFERENTIAL/PLATELET - Abnormal; Notable for the following components:      Result Value   RBC 5.99 (*)    All other components within normal limits  COMPREHENSIVE METABOLIC PANEL - Abnormal; Notable for the following components:   Sodium 133 (*)    Chloride 96 (*)    Glucose, Bld 499 (*)    Total Protein 8.2 (*)    ALT 46 (*)    Alkaline Phosphatase 166 (*)    All other components within normal limits  POC OCCULT BLOOD, ED  TYPE AND SCREEN  ABO/RH    EKG None  Radiology No results found.  Procedures Procedures    Medications Ordered in ED Medications  insulin aspart (novoLOG) injection 5 Units (has no administration in time range)    ED Course/ Medical Decision Making/ A&P                           Medical Decision Making Amount and/or Complexity of Data Reviewed Labs: ordered.  Risk Prescription drug management.  This patient presents to the ED for concern of rectal bleeding, this involves an extensive number of treatment options, and is a complaint that carries with it a high risk of  complications and morbidity.  The differential diagnosis includes hemorrhoids, diverticulitis   Co morbidities that complicate the patient evaluation  Coronary disease   Additional history obtained:  Additional history obtained from patient External records from outside source obtained and reviewed including hospital record   Lab Tests:  I Ordered, and personally interpreted labs.  The pertinent results include: CBC shows hemoglobin 17, glucose 499 BUN 17   Imaging Studies ordered:  No imaging Cardiac Monitoring: / EKG:  The patient was maintained on a cardiac monitor.  I personally viewed and interpreted the cardiac monitored which showed an underlying rhythm of: Normal sinus rhythm   Consultations Obtained:  No consult  Problem List / ED Course / Critical interventions / Medication management  Diabetes, coronary artery disease, rectal bleeding No medicines Reevaluation of the patient after these medicines showed that the patient stayed the same I have reviewed the patients home medicines and have made adjustments as needed   Social Determinants of Health:  None   Test / Admission - Considered:  Colonoscopy considered for later  Patient with rectal bleeding.  Vital signs are stable and hemoglobin 17.  He will be discharged home to follow-up with GI.  Suspect internal hemorrhoid.  We will continue Plavix        Final Clinical Impression(s) / ED Diagnoses Final diagnoses:  Rectal bleeding    Rx / DC Orders ED Discharge Orders     None         Bethann Berkshire, MD 09/16/21 1806

## 2021-09-16 NOTE — ED Provider Notes (Signed)
Rectal exam shows bright red blood and positive.   Devin Berkshire, MD 09/16/21 1100

## 2021-09-20 ENCOUNTER — Ambulatory Visit: Payer: Medicaid Other | Admitting: "Endocrinology

## 2021-09-20 DIAGNOSIS — E1122 Type 2 diabetes mellitus with diabetic chronic kidney disease: Secondary | ICD-10-CM | POA: Diagnosis not present

## 2021-09-20 DIAGNOSIS — Z716 Tobacco abuse counseling: Secondary | ICD-10-CM | POA: Diagnosis not present

## 2021-09-20 DIAGNOSIS — I129 Hypertensive chronic kidney disease with stage 1 through stage 4 chronic kidney disease, or unspecified chronic kidney disease: Secondary | ICD-10-CM | POA: Diagnosis not present

## 2021-09-20 DIAGNOSIS — I639 Cerebral infarction, unspecified: Secondary | ICD-10-CM | POA: Diagnosis not present

## 2021-09-20 DIAGNOSIS — I2510 Atherosclerotic heart disease of native coronary artery without angina pectoris: Secondary | ICD-10-CM | POA: Diagnosis not present

## 2021-09-20 DIAGNOSIS — E871 Hypo-osmolality and hyponatremia: Secondary | ICD-10-CM | POA: Diagnosis not present

## 2021-09-20 DIAGNOSIS — N200 Calculus of kidney: Secondary | ICD-10-CM | POA: Diagnosis not present

## 2021-09-20 DIAGNOSIS — N189 Chronic kidney disease, unspecified: Secondary | ICD-10-CM | POA: Diagnosis not present

## 2021-09-20 DIAGNOSIS — E559 Vitamin D deficiency, unspecified: Secondary | ICD-10-CM | POA: Diagnosis not present

## 2021-10-15 ENCOUNTER — Inpatient Hospital Stay (HOSPITAL_COMMUNITY)
Admission: EM | Admit: 2021-10-15 | Discharge: 2021-10-17 | DRG: 871 | Disposition: A | Discharge status: Home / Self Care | Payer: Medicare Other | Attending: Internal Medicine | Admitting: Internal Medicine

## 2021-10-15 ENCOUNTER — Emergency Department (HOSPITAL_COMMUNITY): Payer: Medicare Other

## 2021-10-15 ENCOUNTER — Encounter (HOSPITAL_COMMUNITY): Payer: Self-pay

## 2021-10-15 DIAGNOSIS — I6502 Occlusion and stenosis of left vertebral artery: Secondary | ICD-10-CM | POA: Diagnosis not present

## 2021-10-15 DIAGNOSIS — K219 Gastro-esophageal reflux disease without esophagitis: Secondary | ICD-10-CM | POA: Diagnosis present

## 2021-10-15 DIAGNOSIS — Z888 Allergy status to other drugs, medicaments and biological substances status: Secondary | ICD-10-CM

## 2021-10-15 DIAGNOSIS — I110 Hypertensive heart disease with heart failure: Secondary | ICD-10-CM | POA: Diagnosis not present

## 2021-10-15 DIAGNOSIS — Z833 Family history of diabetes mellitus: Secondary | ICD-10-CM

## 2021-10-15 DIAGNOSIS — I2510 Atherosclerotic heart disease of native coronary artery without angina pectoris: Secondary | ICD-10-CM | POA: Diagnosis present

## 2021-10-15 DIAGNOSIS — E1165 Type 2 diabetes mellitus with hyperglycemia: Secondary | ICD-10-CM | POA: Diagnosis not present

## 2021-10-15 DIAGNOSIS — Z823 Family history of stroke: Secondary | ICD-10-CM

## 2021-10-15 DIAGNOSIS — N2889 Other specified disorders of kidney and ureter: Secondary | ICD-10-CM | POA: Diagnosis not present

## 2021-10-15 DIAGNOSIS — J449 Chronic obstructive pulmonary disease, unspecified: Secondary | ICD-10-CM | POA: Diagnosis present

## 2021-10-15 DIAGNOSIS — Z8673 Personal history of transient ischemic attack (TIA), and cerebral infarction without residual deficits: Secondary | ICD-10-CM | POA: Diagnosis not present

## 2021-10-15 DIAGNOSIS — Z7902 Long term (current) use of antithrombotics/antiplatelets: Secondary | ICD-10-CM

## 2021-10-15 DIAGNOSIS — R4182 Altered mental status, unspecified: Secondary | ICD-10-CM | POA: Diagnosis not present

## 2021-10-15 DIAGNOSIS — I6523 Occlusion and stenosis of bilateral carotid arteries: Secondary | ICD-10-CM | POA: Diagnosis not present

## 2021-10-15 DIAGNOSIS — Z87442 Personal history of urinary calculi: Secondary | ICD-10-CM | POA: Diagnosis not present

## 2021-10-15 DIAGNOSIS — R6520 Severe sepsis without septic shock: Secondary | ICD-10-CM

## 2021-10-15 DIAGNOSIS — F17210 Nicotine dependence, cigarettes, uncomplicated: Secondary | ICD-10-CM | POA: Diagnosis present

## 2021-10-15 DIAGNOSIS — G9340 Encephalopathy, unspecified: Secondary | ICD-10-CM | POA: Diagnosis not present

## 2021-10-15 DIAGNOSIS — Z8349 Family history of other endocrine, nutritional and metabolic diseases: Secondary | ICD-10-CM

## 2021-10-15 DIAGNOSIS — I5042 Chronic combined systolic (congestive) and diastolic (congestive) heart failure: Secondary | ICD-10-CM | POA: Diagnosis not present

## 2021-10-15 DIAGNOSIS — A419 Sepsis, unspecified organism: Secondary | ICD-10-CM | POA: Diagnosis not present

## 2021-10-15 DIAGNOSIS — E860 Dehydration: Secondary | ICD-10-CM | POA: Diagnosis present

## 2021-10-15 DIAGNOSIS — Z91199 Patient's noncompliance with other medical treatment and regimen due to unspecified reason: Secondary | ICD-10-CM

## 2021-10-15 DIAGNOSIS — I255 Ischemic cardiomyopathy: Secondary | ICD-10-CM | POA: Diagnosis present

## 2021-10-15 DIAGNOSIS — R6510 Systemic inflammatory response syndrome (SIRS) of non-infectious origin without acute organ dysfunction: Secondary | ICD-10-CM

## 2021-10-15 DIAGNOSIS — N179 Acute kidney failure, unspecified: Secondary | ICD-10-CM | POA: Diagnosis not present

## 2021-10-15 DIAGNOSIS — Z79899 Other long term (current) drug therapy: Secondary | ICD-10-CM

## 2021-10-15 DIAGNOSIS — Z955 Presence of coronary angioplasty implant and graft: Secondary | ICD-10-CM | POA: Diagnosis not present

## 2021-10-15 DIAGNOSIS — E871 Hypo-osmolality and hyponatremia: Secondary | ICD-10-CM | POA: Diagnosis present

## 2021-10-15 DIAGNOSIS — U071 COVID-19: Secondary | ICD-10-CM | POA: Diagnosis not present

## 2021-10-15 DIAGNOSIS — Z8249 Family history of ischemic heart disease and other diseases of the circulatory system: Secondary | ICD-10-CM

## 2021-10-15 DIAGNOSIS — Z794 Long term (current) use of insulin: Secondary | ICD-10-CM

## 2021-10-15 DIAGNOSIS — F32A Depression, unspecified: Secondary | ICD-10-CM | POA: Diagnosis present

## 2021-10-15 DIAGNOSIS — E669 Obesity, unspecified: Secondary | ICD-10-CM | POA: Diagnosis present

## 2021-10-15 DIAGNOSIS — K760 Fatty (change of) liver, not elsewhere classified: Secondary | ICD-10-CM | POA: Diagnosis not present

## 2021-10-15 DIAGNOSIS — E785 Hyperlipidemia, unspecified: Secondary | ICD-10-CM | POA: Diagnosis present

## 2021-10-15 DIAGNOSIS — Z9049 Acquired absence of other specified parts of digestive tract: Secondary | ICD-10-CM | POA: Diagnosis not present

## 2021-10-15 DIAGNOSIS — A4189 Other specified sepsis: Secondary | ICD-10-CM | POA: Diagnosis not present

## 2021-10-15 DIAGNOSIS — E1100 Type 2 diabetes mellitus with hyperosmolarity without nonketotic hyperglycemic-hyperosmolar coma (NKHHC): Secondary | ICD-10-CM | POA: Diagnosis not present

## 2021-10-15 DIAGNOSIS — E039 Hypothyroidism, unspecified: Secondary | ICD-10-CM | POA: Diagnosis present

## 2021-10-15 DIAGNOSIS — Z95820 Peripheral vascular angioplasty status with implants and grafts: Secondary | ICD-10-CM | POA: Diagnosis not present

## 2021-10-15 DIAGNOSIS — G9341 Metabolic encephalopathy: Secondary | ICD-10-CM | POA: Diagnosis present

## 2021-10-15 DIAGNOSIS — Z7982 Long term (current) use of aspirin: Secondary | ICD-10-CM

## 2021-10-15 DIAGNOSIS — Z7984 Long term (current) use of oral hypoglycemic drugs: Secondary | ICD-10-CM

## 2021-10-15 DIAGNOSIS — G4733 Obstructive sleep apnea (adult) (pediatric): Secondary | ICD-10-CM | POA: Diagnosis present

## 2021-10-15 DIAGNOSIS — Z79890 Hormone replacement therapy (postmenopausal): Secondary | ICD-10-CM

## 2021-10-15 DIAGNOSIS — Z6830 Body mass index (BMI) 30.0-30.9, adult: Secondary | ICD-10-CM

## 2021-10-15 DIAGNOSIS — J439 Emphysema, unspecified: Secondary | ICD-10-CM | POA: Diagnosis not present

## 2021-10-15 DIAGNOSIS — M549 Dorsalgia, unspecified: Secondary | ICD-10-CM | POA: Diagnosis not present

## 2021-10-15 DIAGNOSIS — I700 Atherosclerosis of aorta: Secondary | ICD-10-CM | POA: Diagnosis not present

## 2021-10-15 LAB — URINALYSIS, ROUTINE W REFLEX MICROSCOPIC
Bacteria, UA: NONE SEEN
Bilirubin Urine: NEGATIVE
Glucose, UA: >=500 mg/dL — AB
Hgb urine dipstick: NEGATIVE
Ketones, ur: NEGATIVE mg/dL
Leukocytes,Ua: NEGATIVE
Nitrite: NEGATIVE
Protein, ur: NEGATIVE mg/dL
Specific Gravity, Urine: 1.035 — ABNORMAL HIGH (ref 1.005–1.030)
pH: 6 (ref 5.0–8.0)

## 2021-10-15 LAB — COMPREHENSIVE METABOLIC PANEL
ALT: 71 U/L — ABNORMAL HIGH (ref 0–44)
AST: 42 U/L — ABNORMAL HIGH (ref 15–41)
Albumin: 4.1 g/dL (ref 3.5–5.0)
Alkaline Phosphatase: 137 U/L — ABNORMAL HIGH (ref 38–126)
Anion gap: 12 (ref 5–15)
BUN: 16 mg/dL (ref 6–20)
CO2: 22 mmol/L (ref 22–32)
Calcium: 9.8 mg/dL (ref 8.9–10.3)
Chloride: 90 mmol/L — ABNORMAL LOW (ref 98–111)
Creatinine, Ser: 1.51 mg/dL — ABNORMAL HIGH (ref 0.61–1.24)
GFR, Estimated: 56 mL/min — ABNORMAL LOW (ref >60)
Glucose, Bld: 692 mg/dL (ref 70–99)
Potassium: 4.6 mmol/L (ref 3.5–5.1)
Sodium: 124 mmol/L — ABNORMAL LOW (ref 135–145)
Total Bilirubin: 0.5 mg/dL (ref 0.3–1.2)
Total Protein: 8 g/dL (ref 6.5–8.1)

## 2021-10-15 LAB — CBC WITH DIFFERENTIAL/PLATELET
Abs Immature Granulocytes: 0.03 10*3/uL (ref 0.00–0.07)
Basophils Absolute: 0.1 10*3/uL (ref 0.0–0.1)
Basophils Relative: 0 %
Eosinophils Absolute: 0.2 10*3/uL (ref 0.0–0.5)
Eosinophils Relative: 2 %
HCT: 46.6 % (ref 39.0–52.0)
Hemoglobin: 15.9 g/dL (ref 13.0–17.0)
Immature Granulocytes: 0 %
Lymphocytes Relative: 22 %
Lymphs Abs: 2.7 10*3/uL (ref 0.7–4.0)
MCH: 28.4 pg (ref 26.0–34.0)
MCHC: 34.1 g/dL (ref 30.0–36.0)
MCV: 83.4 fL (ref 80.0–100.0)
Monocytes Absolute: 0.6 10*3/uL (ref 0.1–1.0)
Monocytes Relative: 4 %
Neutro Abs: 9 10*3/uL — ABNORMAL HIGH (ref 1.7–7.7)
Neutrophils Relative %: 72 %
Platelets: 298 10*3/uL (ref 150–400)
RBC: 5.59 MIL/uL (ref 4.22–5.81)
RDW: 13.7 % (ref 11.5–15.5)
WBC: 12.6 10*3/uL — ABNORMAL HIGH (ref 4.0–10.5)
nRBC: 0 % (ref 0.0–0.2)

## 2021-10-15 LAB — BLOOD GAS, VENOUS
Acid-Base Excess: 2.2 mmol/L — ABNORMAL HIGH (ref 0.0–2.0)
Bicarbonate: 25.3 mmol/L (ref 20.0–28.0)
Drawn by: 27160
FIO2: 36 %
O2 Saturation: 98.3 %
Patient temperature: 38.9
pCO2, Ven: 37 mmHg — ABNORMAL LOW (ref 44–60)
pH, Ven: 7.45 — ABNORMAL HIGH (ref 7.25–7.43)
pO2, Ven: 93 mmHg — ABNORMAL HIGH (ref 32–45)

## 2021-10-15 LAB — RENAL FUNCTION PANEL
Albumin: 3.4 g/dL — ABNORMAL LOW (ref 3.5–5.0)
Anion gap: 9 (ref 5–15)
BUN: 11 mg/dL (ref 6–20)
CO2: 23 mmol/L (ref 22–32)
Calcium: 8.8 mg/dL — ABNORMAL LOW (ref 8.9–10.3)
Chloride: 99 mmol/L (ref 98–111)
Creatinine, Ser: 1 mg/dL (ref 0.61–1.24)
GFR, Estimated: >60 mL/min (ref >60)
Glucose, Bld: 301 mg/dL — ABNORMAL HIGH (ref 70–99)
Phosphorus: 3.2 mg/dL (ref 2.5–4.6)
Potassium: 3.9 mmol/L (ref 3.5–5.1)
Sodium: 131 mmol/L — ABNORMAL LOW (ref 135–145)

## 2021-10-15 LAB — I-STAT CHEM 8, ED
BUN: 16 mg/dL (ref 6–20)
Calcium, Ion: 1.2 mmol/L (ref 1.15–1.40)
Chloride: 94 mmol/L — ABNORMAL LOW (ref 98–111)
Creatinine, Ser: 1.4 mg/dL — ABNORMAL HIGH (ref 0.61–1.24)
Glucose, Bld: >700 mg/dL (ref 70–99)
HCT: 51 % (ref 39.0–52.0)
Hemoglobin: 17.3 g/dL — ABNORMAL HIGH (ref 13.0–17.0)
Potassium: 4.9 mmol/L (ref 3.5–5.1)
Sodium: 127 mmol/L — ABNORMAL LOW (ref 135–145)
TCO2: 24 mmol/L (ref 22–32)

## 2021-10-15 LAB — GLUCOSE, CAPILLARY: Glucose-Capillary: 249 mg/dL — ABNORMAL HIGH (ref 70–99)

## 2021-10-15 LAB — RAPID URINE DRUG SCREEN, HOSP PERFORMED
Amphetamines: NOT DETECTED
Barbiturates: NOT DETECTED
Benzodiazepines: NOT DETECTED
Cocaine: NOT DETECTED
Opiates: NOT DETECTED
Tetrahydrocannabinol: NOT DETECTED

## 2021-10-15 LAB — BETA-HYDROXYBUTYRIC ACID: Beta-Hydroxybutyric Acid: 0.18 mmol/L (ref 0.05–0.27)

## 2021-10-15 LAB — TSH: TSH: 1.282 u[IU]/mL (ref 0.350–4.500)

## 2021-10-15 LAB — RESP PANEL BY RT-PCR (FLU A&B, COVID) ARPGX2
Influenza A by PCR: NEGATIVE
Influenza B by PCR: NEGATIVE
SARS Coronavirus 2 by RT PCR: POSITIVE — AB

## 2021-10-15 LAB — PROTIME-INR
INR: 1 (ref 0.8–1.2)
Prothrombin Time: 12.6 seconds (ref 11.4–15.2)

## 2021-10-15 LAB — CK: Total CK: 31 U/L — ABNORMAL LOW (ref 49–397)

## 2021-10-15 LAB — OSMOLALITY: Osmolality: 313 mOsm/kg — ABNORMAL HIGH (ref 275–295)

## 2021-10-15 LAB — AMMONIA: Ammonia: 73 umol/L — ABNORMAL HIGH (ref 9–35)

## 2021-10-15 LAB — LACTIC ACID, PLASMA
Lactic Acid, Venous: 2.8 mmol/L (ref 0.5–1.9)
Lactic Acid, Venous: 1.9 mmol/L (ref 0.5–1.9)

## 2021-10-15 LAB — CBG MONITORING, ED
Glucose-Capillary: >600 mg/dL (ref 70–99)
Glucose-Capillary: 437 mg/dL — ABNORMAL HIGH (ref 70–99)

## 2021-10-15 MED ORDER — ACETAMINOPHEN 325 MG PO TABS
650.0000 mg | ORAL_TABLET | Freq: Once | ORAL | Status: AC
  Administered 2021-10-15: 650 mg via ORAL
  Filled 2021-10-15: qty 2

## 2021-10-15 MED ORDER — FENOFIBRATE 160 MG PO TABS
160.0000 mg | ORAL_TABLET | Freq: Every day | ORAL | Status: DC
  Administered 2021-10-16 – 2021-10-17 (×2): 160 mg via ORAL
  Filled 2021-10-15 (×2): qty 1

## 2021-10-15 MED ORDER — SODIUM CHLORIDE 0.9 % IV BOLUS
1000.0000 mL | Freq: Once | INTRAVENOUS | Status: AC
  Administered 2021-10-15: 1000 mL via INTRAVENOUS

## 2021-10-15 MED ORDER — ICOSAPENT ETHYL 1 G PO CAPS
2.0000 g | ORAL_CAPSULE | Freq: Two times a day (BID) | ORAL | Status: DC
  Administered 2021-10-16 – 2021-10-17 (×4): 2 g via ORAL
  Filled 2021-10-15 (×6): qty 2

## 2021-10-15 MED ORDER — BISACODYL 10 MG RE SUPP: 10.0000 mg | Freq: Every day | RECTAL | Status: DC | PRN

## 2021-10-15 MED ORDER — MOMETASONE FURO-FORMOTEROL FUM 200-5 MCG/ACT IN AERO
2.0000 | INHALATION_SPRAY | Freq: Two times a day (BID) | RESPIRATORY_TRACT | Status: DC
  Administered 2021-10-16 – 2021-10-17 (×3): 2 via RESPIRATORY_TRACT
  Filled 2021-10-15: qty 8.8

## 2021-10-15 MED ORDER — HEPARIN SODIUM (PORCINE) 5000 UNIT/ML IJ SOLN
5000.0000 [IU] | Freq: Three times a day (TID) | INTRAMUSCULAR | Status: DC
Start: 2021-10-15 — End: 2021-10-17
  Administered 2021-10-16 – 2021-10-17 (×5): 5000 [IU] via SUBCUTANEOUS
  Filled 2021-10-15 (×5): qty 1

## 2021-10-15 MED ORDER — NALOXONE HCL 2 MG/2ML IJ SOSY: 2.0000 mg | PREFILLED_SYRINGE | INTRAMUSCULAR | Status: DC | PRN

## 2021-10-15 MED ORDER — INSULIN ASPART 100 UNIT/ML IJ SOLN
35.0000 [IU] | Freq: Once | INTRAMUSCULAR | Status: DC
Start: 2021-10-15 — End: 2021-10-16

## 2021-10-15 MED ORDER — ACETAMINOPHEN 650 MG RE SUPP: 650.0000 mg | Freq: Once | RECTAL | Status: DC

## 2021-10-15 MED ORDER — PANTOPRAZOLE SODIUM 40 MG PO TBEC
40.0000 mg | DELAYED_RELEASE_TABLET | Freq: Every day | ORAL | Status: DC
  Administered 2021-10-16 – 2021-10-17 (×2): 40 mg via ORAL
  Filled 2021-10-15 (×2): qty 1

## 2021-10-15 MED ORDER — POLYETHYLENE GLYCOL 3350 17 G PO PACK: 17.0000 g | PACK | Freq: Every day | ORAL | Status: DC | PRN

## 2021-10-15 MED ORDER — INSULIN DETEMIR 100 UNIT/ML ~~LOC~~ SOLN
40.0000 [IU] | Freq: Two times a day (BID) | SUBCUTANEOUS | Status: DC
  Administered 2021-10-16 – 2021-10-17 (×4): 40 [IU] via SUBCUTANEOUS
  Filled 2021-10-15 (×6): qty 0.4

## 2021-10-15 MED ORDER — INSULIN ASPART 100 UNIT/ML IV SOLN
10.0000 [IU] | Freq: Once | INTRAVENOUS | Status: AC
  Administered 2021-10-15: 10 [IU] via INTRAVENOUS

## 2021-10-15 MED ORDER — LACTATED RINGERS IV BOLUS
1000.0000 mL | Freq: Once | INTRAVENOUS | Status: AC
  Administered 2021-10-15: 1000 mL via INTRAVENOUS

## 2021-10-15 MED ORDER — ONDANSETRON HCL 4 MG/2ML IJ SOLN: 4.0000 mg | Freq: Four times a day (QID) | INTRAMUSCULAR | Status: DC | PRN

## 2021-10-15 MED ORDER — INSULIN ASPART 100 UNIT/ML IJ SOLN
0.0000 [IU] | Freq: Every day | INTRAMUSCULAR | Status: DC
  Administered 2021-10-16: 12 [IU] via SUBCUTANEOUS
  Administered 2021-10-16: 6 [IU] via SUBCUTANEOUS

## 2021-10-15 MED ORDER — CLOPIDOGREL BISULFATE 75 MG PO TABS
75.0000 mg | ORAL_TABLET | Freq: Every day | ORAL | Status: DC
  Administered 2021-10-16 – 2021-10-17 (×2): 75 mg via ORAL
  Filled 2021-10-15 (×2): qty 1

## 2021-10-15 MED ORDER — SODIUM CHLORIDE 0.9 % IV SOLN: INTRAVENOUS | Status: DC | PRN

## 2021-10-15 MED ORDER — SODIUM CHLORIDE 0.9 % IV SOLN
2.0000 g | Freq: Three times a day (TID) | INTRAVENOUS | Status: DC
  Administered 2021-10-15 – 2021-10-17 (×6): 2 g via INTRAVENOUS
  Filled 2021-10-15 (×6): qty 12.5

## 2021-10-15 MED ORDER — ASPIRIN 81 MG PO TBEC
81.0000 mg | DELAYED_RELEASE_TABLET | Freq: Every day | ORAL | Status: DC
  Administered 2021-10-16 – 2021-10-17 (×2): 81 mg via ORAL
  Filled 2021-10-15 (×2): qty 1

## 2021-10-15 MED ORDER — LEVOTHYROXINE SODIUM 88 MCG PO TABS
88.0000 ug | ORAL_TABLET | Freq: Every day | ORAL | Status: DC
Start: 2021-10-16 — End: 2021-10-17
  Administered 2021-10-16 – 2021-10-17 (×2): 88 ug via ORAL
  Filled 2021-10-15 (×2): qty 1

## 2021-10-15 MED ORDER — SODIUM CHLORIDE 0.9 % IV BOLUS: 2000.0000 mL | Freq: Once | INTRAVENOUS | Status: DC

## 2021-10-15 MED ORDER — IOHEXOL 350 MG/ML SOLN
150.0000 mL | Freq: Once | INTRAVENOUS | Status: AC | PRN
  Administered 2021-10-15: 150 mL via INTRAVENOUS

## 2021-10-15 MED ORDER — ONDANSETRON HCL 4 MG PO TABS: 4.0000 mg | ORAL_TABLET | Freq: Four times a day (QID) | ORAL | Status: DC | PRN

## 2021-10-15 MED ORDER — EZETIMIBE 10 MG PO TABS
10.0000 mg | ORAL_TABLET | Freq: Every day | ORAL | Status: DC
  Administered 2021-10-16 – 2021-10-17 (×2): 10 mg via ORAL
  Filled 2021-10-15 (×2): qty 1

## 2021-10-15 MED ORDER — SODIUM CHLORIDE 0.9% FLUSH
3.0000 mL | Freq: Two times a day (BID) | INTRAVENOUS | Status: DC
Start: 2021-10-15 — End: 2021-10-17
  Administered 2021-10-17: 3 mL via INTRAVENOUS

## 2021-10-15 MED ORDER — SODIUM CHLORIDE 0.9% FLUSH
3.0000 mL | Freq: Two times a day (BID) | INTRAVENOUS | Status: DC
  Administered 2021-10-17: 3 mL via INTRAVENOUS

## 2021-10-15 MED ORDER — SENNOSIDES-DOCUSATE SODIUM 8.6-50 MG PO TABS: 2.0000 | ORAL_TABLET | Freq: Every day | ORAL | Status: DC

## 2021-10-15 MED ORDER — ALBUTEROL SULFATE (2.5 MG/3ML) 0.083% IN NEBU: 2.5000 mg | INHALATION_SOLUTION | RESPIRATORY_TRACT | Status: DC | PRN

## 2021-10-15 MED ORDER — SODIUM CHLORIDE 0.9% FLUSH: 3.0000 mL | INTRAVENOUS | Status: DC | PRN

## 2021-10-15 MED ORDER — ACETAMINOPHEN 650 MG RE SUPP: 650.0000 mg | Freq: Four times a day (QID) | RECTAL | Status: DC | PRN

## 2021-10-15 MED ORDER — SODIUM CHLORIDE 0.9 % IV SOLN: INTRAVENOUS | Status: DC

## 2021-10-15 MED ORDER — INSULIN ASPART 100 UNIT/ML IJ SOLN
4.0000 [IU] | Freq: Three times a day (TID) | INTRAMUSCULAR | Status: DC
  Administered 2021-10-16 – 2021-10-17 (×5): 4 [IU] via SUBCUTANEOUS

## 2021-10-15 MED ORDER — INSULIN ASPART 100 UNIT/ML IJ SOLN
0.0000 [IU] | Freq: Three times a day (TID) | INTRAMUSCULAR | Status: DC
  Administered 2021-10-16: 11 [IU] via SUBCUTANEOUS
  Administered 2021-10-16: 4 [IU] via SUBCUTANEOUS
  Administered 2021-10-16 – 2021-10-17 (×3): 7 [IU] via SUBCUTANEOUS

## 2021-10-15 MED ORDER — ACETAMINOPHEN 325 MG PO TABS
650.0000 mg | ORAL_TABLET | Freq: Four times a day (QID) | ORAL | Status: DC | PRN
  Administered 2021-10-15: 650 mg via ORAL
  Filled 2021-10-15: qty 2

## 2021-10-15 MED ORDER — SODIUM CHLORIDE 0.9 % IV BOLUS
2000.0000 mL | Freq: Once | INTRAVENOUS | Status: AC
Start: 2021-10-16 — End: 2021-10-15
  Administered 2021-10-15: 2000 mL via INTRAVENOUS

## 2021-10-15 MED ORDER — BUPROPION HCL ER (SR) 150 MG PO TB12: 150.0000 mg | ORAL_TABLET | Freq: Every day | ORAL | Status: DC

## 2021-10-15 MED ORDER — VANCOMYCIN HCL 2000 MG/400ML IV SOLN
2000.0000 mg | Freq: Once | INTRAVENOUS | Status: AC
  Administered 2021-10-15: 2000 mg via INTRAVENOUS
  Filled 2021-10-15: qty 400

## 2021-10-15 MED ORDER — METOPROLOL TARTRATE 25 MG PO TABS
25.0000 mg | ORAL_TABLET | Freq: Two times a day (BID) | ORAL | Status: DC
  Administered 2021-10-15 – 2021-10-17 (×4): 25 mg via ORAL
  Filled 2021-10-15 (×4): qty 1

## 2021-10-15 MED ORDER — NALOXONE HCL 0.4 MG/ML IJ SOLN
INTRAMUSCULAR | Status: AC
  Administered 2021-10-15: 0.4 mg
  Filled 2021-10-15: qty 1

## 2021-10-15 MED ORDER — LEVOTHYROXINE SODIUM 50 MCG PO TABS: 200.0000 ug | ORAL_TABLET | Freq: Every day | ORAL | Status: DC

## 2021-10-15 MED ORDER — VANCOMYCIN HCL IN DEXTROSE 1-5 GM/200ML-% IV SOLN
1000.0000 mg | Freq: Two times a day (BID) | INTRAVENOUS | Status: DC
  Administered 2021-10-16: 1000 mg via INTRAVENOUS
  Filled 2021-10-15 (×2): qty 200

## 2021-10-15 NOTE — ED Notes (Signed)
Date and time results received: 10/15/21 @ 1725   Test: Lactic  Critical Value: 2.8  Name of Provider Notified: Dr Posey Rea  Orders Received? Or Actions Taken?: Orders Received - See Orders for details

## 2021-10-15 NOTE — ED Notes (Signed)
While this RN was in Pts room starting continuous IV fluids, Pt fell asleep. EDP at bedside. All V/S remain stable. Pt moved to Room Air and O2 Sats maintained steady at 94%-95%.

## 2021-10-15 NOTE — Progress Notes (Signed)
Pharmacy Antibiotic Note  Devin [name] is a 51 y.o. male admitted on 10/15/2021 with sepsis.  Pharmacy has been consulted for vancomycin and cefepime dosing.  Plan: Vancomycin 2000 mg IV x 1 dose Vancomycin 1000 mg IV every 12 hours Cefepime 2000 mg IV every 8 hours. Monitor labs, c/s, and vanco level as indicated.     No data recorded.  Recent Labs  Lab 10/15/21 1656 10/15/21 1703  WBC 12.6*  --   CREATININE  --  1.40*    CrCl cannot be calculated (Unknown ideal weight.).    Allergies  Allergen Reactions   Crestor [Rosuvastatin] Other (See Comments)    rhabdomyolosis   Trulicity [Dulaglutide] Nausea And Vomiting   Bee Venom Swelling    SWELLING REACTION UNSPECIFIED     Antimicrobials this admission: Vanco 8/5 >> Cefepime 8/5 >>  Microbiology results: 8/5 BCx: pending  Thank you for allowing pharmacy to be a part of this patient's care.  Tad Moore 10/15/2021 5:28 PM

## 2021-10-15 NOTE — ED Notes (Signed)
Went with RN and staff to radiology for scans

## 2021-10-15 NOTE — H&P (Signed)
Patient Demographics:    Devin [name], is a 51 y.o. male  MRN: [id number]   DOB - 05-01-1970  Admit Date - 10/15/2021  Outpatient Primary MD for the patient is Tucker Nation, MD   Assessment & Plan:   Assessment and Plan:  1) acute metabolic encephalopathy with unresponsive episodes and significant lethargy--- HHS is part of the differential diagnosis given elevated glucose -Patient with history of recurrent strokes and prior stent in the left V4 segment into the basilar artery -CT head without contrast and CTA head and neck noted -Get brain MRI -Low index of suspicion for seizures at this time... Hold off on EEG -Patient had fevers up to 102 and WBCs 12.6 consider lumbar puncture if fails to improve with conservative measures over the next 24 hours or so -Check serum ammonia given hepatic steatosis and elevated LFTs -Initial lactic acid was 2.8 improved to 1.9 post IV fluids -CT chest abdomen and pelvis without acute infectious findings -Urine drug screen is negative  -UA not suggestive of UTI -Continue IV cefepime/vancomycin pending culture data  2) uncontrolled DM--without DKA -Glucose was 692 bicarb was 22, anion gap of 12 -Beta hydroxybutyric acid is not elevated -IV fluids and subcu insulin as ordered  3)History of prior strokes--- continue aspirin/Plavix and Zetia and Vascepa -CT head and neck  as noted above #1 -Brain MRI pending -Given history of stroke seizure is part of the differential diagnosis but hold off on EEG at this time as above #1 -Discontinue Wellbutrin as it tends to lower seizure threshold  4)H/o CAD--- with prior angioplasty and stenting--antiplatelet and anticholesterol agents as above #3 -Metoprolol as ordered  5)AKI----acute kidney injury -due to severe  hyperglycemia/dehydration -Creatinine is up to 1.51, creatinine was 1.0 on 09/16/2021 -Should improve with IV fluids - renally adjust medications, avoid nephrotoxic agents / dehydration  / hypotension  6)Pseudohyponatremia----Sodium is 124... When corrected for blood glucose sodium is actually borderline normal -Continue to maintain adequate hydration  7)Hepatic steatosis/elevated LFTs- -AST 42 ALT 71 alk phos is 137..  -Serum ammonia requested and pending  8) incidental left renal mass--- - CT abdomen and pelvis today shows 1.1 cm mass in the lower pole of the left kidney noted ultrasound recommended (patient actually had renal ultrasound on 08/23/2021 without acute findings)  9) hypothyroidism--- continue levothyroxine TSH WNL  10) obesity/OSA----CPAP nightly advised... Hold off on checking ABG..  Doubt patient has severe hypercapnia at this time -Patient had transient hypoxia and was weaned off oxygen in the ED  11) social/ethics--- plan of care discussed with patient's 2 sisters at bedside. --- Patient is a full code  12)history of ischemic cardiomyopathy with combined systolic and diastolic dysfunction CHF (EF improved from 35 to 40% to 50 to 55%) -Be judicious with IV fluids -Continue metoprolol  Disposition/Need for in-Hospital Stay- patient unable to be discharged at this time due to -severe hyperglycemia requiring IV fluids and insulin therapy -Acute metabolic encephalopathy  with SIRS pathophysiology requiring IV antibiotics pending culture data and further investigation*  Status is: Inpatient  Remains inpatient appropriate because:   Dispo: The patient is from: Home              Anticipated d/c is to: Home              Anticipated d/c date is: 3 days              Patient currently is not medically stable to d/c. Barriers: Not Clinically Stable-    With History of - Reviewed by me  Past Medical History:  Diagnosis Date   Asthma    Chest pain    CHF (congestive  heart failure) (HCC)    Coronary artery disease    a. s/p DES x2 to LAD and DES to OM in 11/2017   Depression    Dyspnea    GERD (gastroesophageal reflux disease)    Headache    History of kidney stones    History of rhabdomyolysis    Hyperlipidemia    Hypertension    Hypothyroidism    Ischemic cardiomyopathy    a. 11/2017: echo showing EF of 35-40%, diffuse HK, and Grade 2 DD   Kidney calculus 2014   Pericardial effusion    Small, by dobutamine echocardiogram, 04/2006   PONV (postoperative nausea and vomiting)    Sleep apnea    does not wear cpap   Stroke ([place])    Tobacco abuse    Type 2 diabetes mellitus without complications ([place]) 5/46/5681   Vitamin D deficiency       Past Surgical History:  Procedure Laterality Date   APPENDECTOMY     CARDIAC CATHETERIZATION  09/2012   "Nonobstructive CAD with 30% proximal, 40% mid LAD disease; 30% proximal CFX; EF 55-65%"   CHOLECYSTECTOMY N/A 01/27/2013   Procedure: LAPAROSCOPIC CHOLECYSTECTOMY;  Surgeon: Jamesetta So, MD;  Location: [facility];  Service: General;  Laterality: N/A;   CORONARY STENT INTERVENTION N/A 11/15/2017   Procedure: CORONARY STENT INTERVENTION;  Surgeon: Leonie Man, MD;  Location: [facility];  Service: Cardiovascular;  Laterality: N/A;   HERNIA REPAIR     As a child   INSERTION OF MESH N/A 11/13/2012   Procedure: INSERTION OF MESH;  Surgeon: Jamesetta So, MD;  Location: [facility];  Service: General;  Laterality: N/A;   IR ANGIO INTRA EXTRACRAN SEL COM CAROTID INNOMINATE BILAT MOD SED  12/01/2017   IR ANGIO INTRA EXTRACRAN SEL COM CAROTID INNOMINATE BILAT MOD SED  02/13/2018   IR ANGIO VERTEBRAL SEL SUBCLAVIAN INNOMINATE UNI L MOD SED  02/13/2018   IR ANGIO VERTEBRAL SEL VERTEBRAL BILAT MOD SED  12/01/2017   IR ANGIO VERTEBRAL SEL VERTEBRAL UNI R MOD SED  02/13/2018   IR INTRA CRAN STENT  12/03/2017   IR PTA INTRACRANIAL  02/25/2018   IR RADIOLOGIST EVAL & MGMT  07/29/2021   IR US GUIDE VASC ACCESS RIGHT   02/13/2018   KIDNEY STONE SURGERY     LEFT HEART CATH AND CORONARY ANGIOGRAPHY N/A 11/15/2017   Procedure: LEFT HEART CATH AND CORONARY ANGIOGRAPHY;  Surgeon: Leonie Man, MD;  Location: [facility];  Service: Cardiovascular;  Laterality: N/A;   PERCUTANEOUS NEPHROLITHOTRIPSY     RADIOLOGY WITH ANESTHESIA Left 12/03/2017   Procedure: Angioplasty with possible stenting of left VBJ;  Surgeon: Luanne Bras, MD;  Location: [facility];  Service: Radiology;  Laterality: Left;   RADIOLOGY WITH ANESTHESIA N/A  02/25/2018   Procedure: STENT PLACEMENT;  Surgeon: Luanne Bras, MD;  Location: [facility];  Service: Radiology;  Laterality: N/A;   UMBILICAL HERNIA REPAIR N/A 11/13/2012   Procedure: UMBILICAL HERNIORRHAPHY;  Surgeon: Jamesetta So, MD;  Location: [facility];  Service: General;  Laterality: N/A;   VARICOCELECTOMY      Chief Complaint  Patient presents with   Hyperglycemia   Altered Mental Status      HPI:    Johann Santone  is a 51 y.o. male with past medical history relevant for tobacco abuse, obesity with OSA noncompliant with CPAP,, CAD with prior angioplasty and stent placement, DM2, prior strokes, HTN, HLD, hypothyroidism, history of ischemic cardiomyopathy with combined systolic and diastolic dysfunction CHF (EF improved from 35 to 40% to 50 to 55%)..  Who presents to the ED with altered mentation..  And elevated blood sugars and fevers -Additional history obtained from patient's 2 sisters at bedside -In the ED temperature was up to 102 -Apparently patient has not felt well on and off throughout the day.... He was supposed to go fishing at the [place] with his cousin and her friend... While they were loading a boat onto the truck patient said he did not feel well.... Patient going to the truck [place] patient started to be more confused and incoherent... He was brought into the ED for further evaluation -Again most of the history is being obtained from  patient's 2 sisters at bedside as patient is lethargic and really able to answer only simple questions -He apparently has been fine until today CT head without contrast and CTA head and neck angio without acute findings... Patient has patent stent in the left V4 segment into the basilar artery -CTA chest abdomen and pelvis without acute findings patient does have hepatic steatosis -1.1 cm mass in the lower pole of the left kidney noted ultrasound recommended (patient actually had renal ultrasound on 08/23/2021 without acute findings) -Urine drug screen is unremarkable -TSH is unremarkable -Total CK is only 31 -UA suggest glucosuria and dehydration but no evidence of infection -Glucose was 692 bicarb was 22, anion gap of 12 -AST 42 ALT 71 alk phos is 137..  Creatinine is up to 1.51, creatinine was 1.0 on 09/16/2021 -Sodium is 124 with a chloride of 90 Beta hydroxybutyric acid is not elevated -WBCs 12.3   Review of systems:    In addition to the HPI above,   A full Review of  Systems was done, all other systems reviewed are negative except as noted above in HPI , .    Social History:  Reviewed by me    Social History   Tobacco Use   Smoking status: Every Day    Packs/day: 1.00    Years: 26.00    Total pack years: 26.00    Types: Cigarettes    Start date: 03/13/1985   Smokeless tobacco: Former    Types: Snuff, Chew    Quit date: 03/14/1987  Substance Use Topics   Alcohol use: No       Family History :  Reviewed by me    Family History  Problem Relation Age of Onset   Coronary artery disease Father    Hypertension Father    Hyperlipidemia Father    Diabetes Father    Congestive Heart Failure Father 53   Arrhythmia Father        had an ICD   Diabetes Mother    Hypertension Mother  Hyperlipidemia Mother    Obesity Mother 40       died after bariatric surgery, liver failure and infection   Coronary artery disease Maternal Grandfather        both grandfathers and  several uncles   Coronary artery disease Other    Diabetes Sister        both sisters   Stroke Maternal Aunt    Stroke Maternal Uncle     Home Medications:   Prior to Admission medications   Medication Sig Start Date End Date Taking? Authorizing Provider  aspirin 81 MG EC tablet Take 1 tablet (81 mg total) by mouth daily. With food 12/05/17   Roxan Hockey, MD  buPROPion (WELLBUTRIN SR) 150 MG 12 hr tablet Take 150 mg by mouth daily. 06/20/21   [provider]  clopidogrel (PLAVIX) 75 MG tablet Take 1 tablet (75 mg total) by mouth daily. 10/15/19   Murlean Iba, MD  Continuous Blood Gluc Receiver (FREESTYLE LIBRE 2 READER) DEVI USE AS DIRECTED 09/02/21   Cassandria Anger, MD  Continuous Blood Gluc Sensor (FREESTYLE LIBRE 2 SENSOR) MISC APPLY 1 PATCH TO SKIN EVERY 14 DAYS 09/02/21   Cassandria Anger, MD  docusate sodium (COLACE) 100 MG capsule Take 100 mg by mouth daily.    [provider]  DULERA 200-5 MCG/ACT AERO Inhale 2 puffs into the lungs 2 (two) times daily. 10/15/19   Johnson, Clanford L, MD  EUTHYROX 200 MCG tablet Take 200 mcg by mouth at bedtime. 05/13/20   [provider]  Evolocumab (REPATHA SURECLICK) 121 MG/ML SOAJ Inject 1 pen into the skin every 14 (fourteen) days. 12/24/19   Arnoldo Lenis, MD  ezetimibe (ZETIA) 10 MG tablet Take 1 tablet (10 mg total) by mouth daily. 10/15/19   Johnson, Clanford L, MD  famotidine (PEPCID) 40 MG tablet Take 40 mg by mouth daily. 11/23/20   [provider]  fenofibrate (TRICOR) 145 MG tablet Take 1 tablet (145 mg total) by mouth daily. Patient not taking: Reported on 06/21/2021 10/15/19   Murlean Iba, MD  fluticasone (FLONASE) 50 MCG/ACT nasal spray Place 2 sprays into both nostrils 2 (two) times daily. 06/24/20   [provider]  furosemide (LASIX) 20 MG tablet TAKE 1 TABLET BY MOUTH AS NEEDED FOR  SWELLING Patient taking differently: Take 20 mg by mouth daily. 02/14/21   Arnoldo Lenis, MD  gabapentin (NEURONTIN) 300 MG capsule One po q AM, one po q PM and 2 po qHS Patient taking differently: Take 300 mg by mouth 2 (two) times daily. 12/23/19   Sater, Nanine Means, MD  glipiZIDE (GLUCOTROL XL) 10 MG 24 hr tablet Take 1 tablet (10 mg total) by mouth daily. 06/15/21   Cassandria Anger, MD  glucose blood (ACCU-CHEK GUIDE) test strip Test glucose 2 times a day 08/10/20   Cassandria Anger, MD  Insulin Lispro Prot & Lispro (HUMALOG MIX 75/25 KWIKPEN) (75-25) 100 UNIT/ML Kwikpen Inject 100 Units into the skin 2 (two) times daily with a meal. 07/13/21   Nida, Marella Chimes, MD  Insulin Pen Needle (B-D ULTRAFINE III SHORT PEN) 31G X 8 MM MISC Use as directed to inject insulin 2 times daily. 05/02/21   Cassandria Anger, MD  levothyroxine (SYNTHROID) 88 MCG tablet Take 88 mcg by mouth daily. 01/25/21   [provider]  methocarbamol (ROBAXIN) 500 MG tablet Take 1 tablet (500 mg total) by mouth every 8 (eight) hours as needed for muscle  spasms. Patient not taking: Reported on 06/21/2021 04/16/21   Maudie Flakes, MD  nitroGLYCERIN (NITROSTAT) 0.4 MG SL tablet Place 1 tablet (0.4 mg total) under the tongue every 5 (five) minutes as needed for chest pain. 02/14/21   Arnoldo Lenis, MD  ondansetron (ZOFRAN-ODT) 4 MG disintegrating tablet Take 1 tablet (4 mg total) by mouth every 8 (eight) hours as needed for nausea or vomiting. 06/22/21   Maudie Flakes, MD  pantoprazole (PROTONIX) 40 MG tablet TAKE 1 TABLET BY MOUTH ONCE DAILY - NEED TO SCHEDULE AN APPOINTMENT Patient taking differently: Take 40 mg by mouth daily. 02/14/21   Arnoldo Lenis, MD  sitaGLIPtin (JANUVIA) 50 MG tablet Take 1 tablet (50 mg total) by mouth daily. 06/15/21   Cassandria Anger, MD  VASCEPA 1 g capsule Take 2 capsules (2 g total) by mouth 2 (two) times daily. 03/11/21   Arnoldo Lenis, MD  VENTOLIN HFA 108 [phone] Base) MCG/ACT inhaler Inhale into the lungs. 11/23/20   [provider]     Allergies:     Allergies  Allergen Reactions   Crestor [Rosuvastatin] Other (See Comments)    rhabdomyolosis   Trulicity [Dulaglutide] Nausea And Vomiting   Bee Venom Swelling    SWELLING REACTION UNSPECIFIED      Physical Exam:   Vitals  Blood pressure (!) 152/83, pulse 85, temperature 97.8 F (36.6 C), temperature source Oral, resp. rate (!) 21, SpO2 99 %.  Physical Examination: General appearance -lethargic, wakes up to answer simple questions and then falls back asleep  mental status -sleepy and disoriented  eyes - sclera anicteric Neck - supple, no JVD elevation , Chest - clear  to auscultation bilaterally, symmetrical air movement,  Heart - S1 and S2 normal, regular ,   Abdomen - soft, nontender, nondistended, +BS, increased truncal adiposity Neurological -Limited exam as patient is sleepy, lethargic able to move all extremities spontaneously and with commands ... Oriented to self .Marland Kitchen  No obvious focal or unilateral deficits at this time  -extremities - no pedal edema noted, intact peripheral pulses  Skin - warm, dry     Data Review:    CBC Recent Labs  Lab 10/15/21 1656 10/15/21 1703  WBC 12.6*  --   HGB 15.9 17.3*  HCT 46.6 51.0  PLT 298  --   MCV 83.4  --   MCH 28.4  --   MCHC 34.1  --   RDW 13.7  --   LYMPHSABS 2.7  --   MONOABS 0.6  --   EOSABS 0.2  --   BASOSABS 0.1  --    ------------------------------------------------------------------------------------------------------------------  Chemistries  Recent Labs  Lab 10/15/21 1656 10/15/21 1703  NA 124* 127*  K 4.6 4.9  CL 90* 94*  CO2 22  --   GLUCOSE 692* >700*  BUN 16 16  CREATININE 1.51* 1.40*  CALCIUM 9.8  --   AST 42*  --   ALT 71*  --   ALKPHOS 137*  --   BILITOT 0.5  --    ------------------------------------------------------------------------------------------------------------------ CrCl cannot be calculated (Unknown ideal  weight.). ------------------------------------------------------------------------------------------------------------------ Recent Labs    10/15/21 1733  TSH 1.282     Coagulation profile Recent Labs  Lab 10/15/21 1656  INR 1.0   ------------------------------------------------------------------------------------------------------------------- No results for input(s): "DDIMER" in the last 72 hours. -------------------------------------------------------------------------------------------------------------------  Cardiac Enzymes No results for input(s): "CKMB", "TROPONINI", "MYOGLOBIN" in the last 168 hours.  Invalid input(s): "CK" ------------------------------------------------------------------------------------------------------------------    Component Value  Date/Time   BNP 42.0 10/15/2019 0728     ---------------------------------------------------------------------------------------------------------------  Urinalysis    Component Value Date/Time   COLORURINE STRAW (A) 10/15/2021 1656   APPEARANCEUR CLEAR 10/15/2021 1656   LABSPEC 1.035 (H) 10/15/2021 1656   PHURINE 6.0 10/15/2021 1656   GLUCOSEU >=500 (A) 10/15/2021 1656   HGBUR NEGATIVE 10/15/2021 1656   BILIRUBINUR NEGATIVE 10/15/2021 1656   KETONESUR NEGATIVE 10/15/2021 1656   PROTEINUR NEGATIVE 10/15/2021 1656   UROBILINOGEN 0.2 10/06/2013 0305   NITRITE NEGATIVE 10/15/2021 1656   LEUKOCYTESUR NEGATIVE 10/15/2021 1656    ----------------------------------------------------------------------------------------------------------------   Imaging Results:    CT ANGIO HEAD NECK W WO CM  Result Date: 10/15/2021 CLINICAL DATA:  Neuro deficit, acute, stroke suspected. Altered mental status. EXAM: CT ANGIOGRAPHY HEAD AND NECK TECHNIQUE: Multidetector CT imaging of the head and neck was performed using the standard protocol during bolus administration of intravenous contrast. Multiplanar CT image  reconstructions and MIPs were obtained to evaluate the vascular anatomy. Carotid stenosis measurements (when applicable) are obtained utilizing NASCET criteria, using the distal internal carotid diameter as the denominator. RADIATION DOSE REDUCTION: This exam was performed according to the departmental dose-optimization program which includes automated exposure control, adjustment of the mA and/or kV according to patient size and/or use of iterative reconstruction technique. CONTRAST:  158m OMNIPAQUE IOHEXOL 350 MG/ML SOLN COMPARISON:  Head and neck CTA 06/21/2021 FINDINGS: CTA NECK FINDINGS Aortic arch: Standard 3 vessel aortic arch with widely patent arch vessel origins. Luminal irregularity in the proximal left subclavian artery due to soft plaque without significant stenosis. Right carotid system: Patent with a small amount of calcified and soft plaque at the carotid bifurcation. No evidence of a significant stenosis or dissection. Left carotid system: Patent with mixed calcified and soft plaque at the carotid bifurcation resulting in 65% stenosis of the ICA origin, not significantly changed from the prior CTA upon remeasurement. Vertebral arteries: Patent and codominant without evidence of a significant stenosis or dissection within limitations of shoulder artifact which results in suboptimal assessment of the V1 segments. Skeleton: No acute osseous abnormality or suspicious osseous lesion. Other neck: Asymmetric fatty atrophy of the right parotid gland. No evidence of cervical lymphadenopathy or mass. Upper chest: Mild paraseptal emphysema in the lung apices. Review of the MIP images confirms the above findings CTA HEAD FINDINGS Anterior circulation: The internal carotid arteries are widely patent from skull base to carotid termini. ACAs and MCAs are patent without evidence of a proximal branch occlusion or significant proximal stenosis. No aneurysm is identified. Posterior circulation: The intracranial right  vertebral artery is patent with unchanged moderate stenosis distally. The intracranial left vertebral artery is patent with a stent again noted extending from the left V4 segment to the mid to distal basilar artery. The stent is patent, however there is a similar appearance of a suspected severe stenosis within the proximal aspect of the stent compared to the prior CTA. The PCAs are patent with mild proximal P1 stenoses bilaterally. No aneurysm is identified. Venous sinuses: Patent. Anatomic variants: None. Review of the MIP images confirms the above findings IMPRESSION: 1. No large vessel occlusion. 2. 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 3. Patent stent extending from the left V4 segment into the basilar artery with an unchanged severe stenosis in the proximal aspect of the stent. 4. Unchanged moderate distal right V4 stenosis. 5. Aortic Atherosclerosis (ICD10-I70.0) and Emphysema (ICD10-J43.9). Electronically Signed   By: ALogan BoresM.D.   On: 10/15/2021 18:14   CT Head  Wo Contrast  Result Date: 10/15/2021 CLINICAL DATA:  Altered mental status. EXAM: CT HEAD WITHOUT CONTRAST TECHNIQUE: Contiguous axial images were obtained from the base of the skull through the vertex without intravenous contrast. RADIATION DOSE REDUCTION: This exam was performed according to the departmental dose-optimization program which includes automated exposure control, adjustment of the mA and/or kV according to patient size and/or use of iterative reconstruction technique. COMPARISON:  Head and neck CTA 06/21/2021.  Head MRI 11/18/2019. FINDINGS: Brain: There is no evidence of an acute infarct, intracranial hemorrhage, mass, midline shift, or extra-axial fluid collection. 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 The ventricles are normal in size. Vascular: Vertebrobasilar stent. Skull: No fracture or suspicious osseous lesion. 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 Clear mastoid air  cells. Unremarkable orbits. Other: None. IMPRESSION: 1. No evidence of acute intracranial abnormality. 2. Chronic left cerebellar infarct. Electronically Signed   By: Logan Bores M.D.   On: 10/15/2021 17:57   CT Angio Chest/Abd/Pel for Dissection W and/or Wo Contrast  Result Date: 10/15/2021 CLINICAL DATA:  Chest pain and back pain. Aortic dissection suspected. EXAM: CT ANGIOGRAPHY CHEST, ABDOMEN AND PELVIS TECHNIQUE: Non-contrast CT of the chest was initially obtained. Multidetector CT imaging through the chest, abdomen and pelvis was performed using the standard protocol during bolus administration of intravenous contrast. Multiplanar reconstructed images and MIPs were obtained and reviewed to evaluate the vascular anatomy. RADIATION DOSE REDUCTION: This exam was performed according to the departmental dose-optimization program which includes automated exposure control, adjustment of the mA and/or kV according to patient size and/or use of iterative reconstruction technique. CONTRAST:  165m OMNIPAQUE IOHEXOL 350 MG/ML SOLN COMPARISON:  Abdominal CT June 21, 2021 FINDINGS: CTA CHEST FINDINGS Cardiovascular: Preferential opacification of the thoracic aorta. No evidence of thoracic aortic aneurysm or dissection. Normal heart size. No pericardial effusion. Mediastinum/Nodes: No enlarged mediastinal, hilar, or axillary lymph nodes. Thyroid gland, trachea, and esophagus demonstrate no significant findings. Lungs/Pleura: Mild upper lobe predominant emphysema. Lower lobe predominant atelectasis versus interstitial lung changes. Musculoskeletal: No chest wall abnormality. No acute or significant osseous findings. Review of the MIP images confirms the above findings. CTA ABDOMEN AND PELVIS FINDINGS VASCULAR Aorta: Normal caliber aorta without aneurysm, dissection, vasculitis or significant stenosis. Celiac: Patent without evidence of aneurysm, dissection, vasculitis or significant stenosis. SMA: Patent without  evidence of aneurysm, dissection, vasculitis or significant stenosis. Renals: Both renal arteries are patent without evidence of aneurysm, dissection, vasculitis, fibromuscular dysplasia or significant stenosis. IMA: Patent without evidence of aneurysm, dissection, vasculitis or significant stenosis. Inflow: Patent without evidence of aneurysm, dissection, vasculitis or significant stenosis. Veins: No obvious venous abnormality within the limitations of this arterial phase study. Review of the MIP images confirms the above findings. NON-VASCULAR Hepatobiliary: Hepatic steatosis. Post cholecystectomy. No evidence of biliary ductal dilation. Pancreas: Unremarkable. No pancreatic ductal dilatation or surrounding inflammatory changes. Spleen: Normal in size without focal abnormality. Adrenals/Urinary Tract: Normal adrenal glands. No evidence of hydronephrosis or nephrolithiasis. The previously seen right nephrolithiasis may be obscured by excreted contrast within the collecting systems of the kidneys. 1.1 cm intermediate density hypoattenuated mass in the lower pole of the left kidney, attenuation of 25.8 Hounsfield units. Normal urinary bladder. Stomach/Bowel: Stomach is within normal limits. Appendix appears normal. No evidence of bowel wall thickening, distention, or inflammatory changes. Lymphatic: No evidence upper the. Mild atherosclerosis of the aorta and bilateral iliac arteries with calcified and noncalcified plaque. No flow limiting stenosis. Reproductive: Prostate is unremarkable. Other: No abdominal wall  hernia or abnormality. No abdominopelvic ascites. Musculoskeletal: No acute or significant osseous findings. Review of the MIP images confirms the above findings. IMPRESSION: 1. No evidence of thoracic or abdominal aortic aneurysm or dissection. 2. Mild atherosclerosis of the aorta and bilateral iliac arteries, without flow limiting stenosis. 3. Hepatic steatosis. 4. 1.1 cm intermediate density  hypoattenuated mass in the lower pole of the left kidney. This may represent a hemorrhagic or proteinaceous cyst, however further evaluation with nonemergent renal ultrasound may be considered. Electronically Signed   By: Fidela Salisbury M.D.   On: 10/15/2021 17:55   DG Chest Portable 1 View  Result Date: 10/15/2021 CLINICAL DATA:  Sepsis, diabetes EXAM: PORTABLE CHEST 1 VIEW COMPARISON:  03/26/2021 FINDINGS: Single frontal view of the chest demonstrates unremarkable cardiac silhouette. No acute airspace disease, effusion, or pneumothorax. No acute bony abnormality. IMPRESSION: 1. No acute intrathoracic process. Electronically Signed   By: Randa Ngo M.D.   On: 10/15/2021 17:23    Radiological Exams on Admission: CT ANGIO HEAD NECK W WO CM  Result Date: 10/15/2021 CLINICAL DATA:  Neuro deficit, acute, stroke suspected. Altered mental status. EXAM: CT ANGIOGRAPHY HEAD AND NECK TECHNIQUE: Multidetector CT imaging of the head and neck was performed using the standard protocol during bolus administration of intravenous contrast. Multiplanar CT image reconstructions and MIPs were obtained to evaluate the vascular anatomy. Carotid stenosis measurements (when applicable) are obtained utilizing NASCET criteria, using the distal internal carotid diameter as the denominator. RADIATION DOSE REDUCTION: This exam was performed according to the departmental dose-optimization program which includes automated exposure control, adjustment of the mA and/or kV according to patient size and/or use of iterative reconstruction technique. CONTRAST:  153m OMNIPAQUE IOHEXOL 350 MG/ML SOLN COMPARISON:  Head and neck CTA 06/21/2021 FINDINGS: CTA NECK FINDINGS Aortic arch: Standard 3 vessel aortic arch with widely patent arch vessel origins. Luminal irregularity in the proximal left subclavian artery due to soft plaque without significant stenosis. Right carotid system: Patent with a small amount of calcified and soft plaque at  the carotid bifurcation. No evidence of a significant stenosis or dissection. Left carotid system: Patent with mixed calcified and soft plaque at the carotid bifurcation resulting in 65% stenosis of the ICA origin, not significantly changed from the prior CTA upon remeasurement. Vertebral arteries: Patent and codominant without evidence of a significant stenosis or dissection within limitations of shoulder artifact which results in suboptimal assessment of the V1 segments. Skeleton: No acute osseous abnormality or suspicious osseous lesion. Other neck: Asymmetric fatty atrophy of the right parotid gland. No evidence of cervical lymphadenopathy or mass. Upper chest: Mild paraseptal emphysema in the lung apices. Review of the MIP images confirms the above findings CTA HEAD FINDINGS Anterior circulation: The internal carotid arteries are widely patent from skull base to carotid termini. ACAs and MCAs are patent without evidence of a proximal branch occlusion or significant proximal stenosis. No aneurysm is identified. Posterior circulation: The intracranial right vertebral artery is patent with unchanged moderate stenosis distally. The intracranial left vertebral artery is patent with a stent again noted extending from the left V4 segment to the mid to distal basilar artery. The stent is patent, however there is a similar appearance of a suspected severe stenosis within the proximal aspect of the stent compared to the prior CTA. The PCAs are patent with mild proximal P1 stenoses bilaterally. No aneurysm is identified. Venous sinuses: Patent. Anatomic variants: None. Review of the MIP images confirms the above findings IMPRESSION: 1. No large vessel  occlusion. 2. 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 3. Patent stent extending from the left V4 segment into the basilar artery with an unchanged severe stenosis in the proximal aspect of the stent. 4. Unchanged moderate distal right V4 stenosis. 5. Aortic Atherosclerosis  (ICD10-I70.0) and Emphysema (ICD10-J43.9). Electronically Signed   By: Logan Bores M.D.   On: 10/15/2021 18:14   CT Head Wo Contrast  Result Date: 10/15/2021 CLINICAL DATA:  Altered mental status. EXAM: CT HEAD WITHOUT CONTRAST TECHNIQUE: Contiguous axial images were obtained from the base of the skull through the vertex without intravenous contrast. RADIATION DOSE REDUCTION: This exam was performed according to the departmental dose-optimization program which includes automated exposure control, adjustment of the mA and/or kV according to patient size and/or use of iterative reconstruction technique. COMPARISON:  Head and neck CTA 06/21/2021.  Head MRI 11/18/2019. FINDINGS: Brain: There is no evidence of an acute infarct, intracranial hemorrhage, mass, midline shift, or extra-axial fluid collection. 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 The ventricles are normal in size. Vascular: Vertebrobasilar stent. Skull: No fracture or suspicious osseous lesion. 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 Clear mastoid air cells. Unremarkable orbits. Other: None. IMPRESSION: 1. No evidence of acute intracranial abnormality. 2. Chronic left cerebellar infarct. Electronically Signed   By: Logan Bores M.D.   On: 10/15/2021 17:57   CT Angio Chest/Abd/Pel for Dissection W and/or Wo Contrast  Result Date: 10/15/2021 CLINICAL DATA:  Chest pain and back pain. Aortic dissection suspected. EXAM: CT ANGIOGRAPHY CHEST, ABDOMEN AND PELVIS TECHNIQUE: Non-contrast CT of the chest was initially obtained. Multidetector CT imaging through the chest, abdomen and pelvis was performed using the standard protocol during bolus administration of intravenous contrast. Multiplanar reconstructed images and MIPs were obtained and reviewed to evaluate the vascular anatomy. RADIATION DOSE REDUCTION: This exam was performed according to the departmental dose-optimization program which includes automated  exposure control, adjustment of the mA and/or kV according to patient size and/or use of iterative reconstruction technique. CONTRAST:  152m OMNIPAQUE IOHEXOL 350 MG/ML SOLN COMPARISON:  Abdominal CT June 21, 2021 FINDINGS: CTA CHEST FINDINGS Cardiovascular: Preferential opacification of the thoracic aorta. No evidence of thoracic aortic aneurysm or dissection. Normal heart size. No pericardial effusion. Mediastinum/Nodes: No enlarged mediastinal, hilar, or axillary lymph nodes. Thyroid gland, trachea, and esophagus demonstrate no significant findings. Lungs/Pleura: Mild upper lobe predominant emphysema. Lower lobe predominant atelectasis versus interstitial lung changes. Musculoskeletal: No chest wall abnormality. No acute or significant osseous findings. Review of the MIP images confirms the above findings. CTA ABDOMEN AND PELVIS FINDINGS VASCULAR Aorta: Normal caliber aorta without aneurysm, dissection, vasculitis or significant stenosis. Celiac: Patent without evidence of aneurysm, dissection, vasculitis or significant stenosis. SMA: Patent without evidence of aneurysm, dissection, vasculitis or significant stenosis. Renals: Both renal arteries are patent without evidence of aneurysm, dissection, vasculitis, fibromuscular dysplasia or significant stenosis. IMA: Patent without evidence of aneurysm, dissection, vasculitis or significant stenosis. Inflow: Patent without evidence of aneurysm, dissection, vasculitis or significant stenosis. Veins: No obvious venous abnormality within the limitations of this arterial phase study. Review of the MIP images confirms the above findings. NON-VASCULAR Hepatobiliary: Hepatic steatosis. Post cholecystectomy. No evidence of biliary ductal dilation. Pancreas: Unremarkable. No pancreatic ductal dilatation or surrounding inflammatory changes. Spleen: Normal in size without focal abnormality. Adrenals/Urinary Tract: Normal adrenal glands. No evidence of hydronephrosis or  nephrolithiasis. The previously seen right nephrolithiasis may be obscured by excreted contrast within the collecting systems of the kidneys. 1.1 cm intermediate density hypoattenuated mass  in the lower pole of the left kidney, attenuation of 25.8 Hounsfield units. Normal urinary bladder. Stomach/Bowel: Stomach is within normal limits. Appendix appears normal. No evidence of bowel wall thickening, distention, or inflammatory changes. Lymphatic: No evidence upper the. Mild atherosclerosis of the aorta and bilateral iliac arteries with calcified and noncalcified plaque. No flow limiting stenosis. Reproductive: Prostate is unremarkable. Other: No abdominal wall hernia or abnormality. No abdominopelvic ascites. Musculoskeletal: No acute or significant osseous findings. Review of the MIP images confirms the above findings. IMPRESSION: 1. No evidence of thoracic or abdominal aortic aneurysm or dissection. 2. Mild atherosclerosis of the aorta and bilateral iliac arteries, without flow limiting stenosis. 3. Hepatic steatosis. 4. 1.1 cm intermediate density hypoattenuated mass in the lower pole of the left kidney. This may represent a hemorrhagic or proteinaceous cyst, however further evaluation with nonemergent renal ultrasound may be considered. Electronically Signed   By: Fidela Salisbury M.D.   On: 10/15/2021 17:55   DG Chest Portable 1 View  Result Date: 10/15/2021 CLINICAL DATA:  Sepsis, diabetes EXAM: PORTABLE CHEST 1 VIEW COMPARISON:  03/26/2021 FINDINGS: Single frontal view of the chest demonstrates unremarkable cardiac silhouette. No acute airspace disease, effusion, or pneumothorax. No acute bony abnormality. IMPRESSION: 1. No acute intrathoracic process. Electronically Signed   By: Randa Ngo M.D.   On: 10/15/2021 17:23    DVT Prophylaxis -SCD/heparin AM Labs Ordered, also please review Full Orders  Family Communication: Admission, patients condition and plan of care including tests being ordered  have been discussed with the patient and 2 sisters at bedside  who indicate understanding and agree with the plan   Condition stable  Roxan Hockey M.D on 10/15/2021 at 10:18 PM Go to [email or web address] -  for contact info  [facility] - Office  [phone]

## 2021-10-15 NOTE — ED Notes (Addendum)
Date and time results received: 10/15/21 @ 1742   Test: Lactic Critical Value: 692  Name of Provider Notified: Dr Posey Rea  Orders Received? Or Actions Taken?: Orders Received - See Orders for details

## 2021-10-15 NOTE — ED Triage Notes (Signed)
Pt arrives with his friend. They were driving when pt started shaking and non communicative. Pt has hx diabetes, cardiac stents. Pt non verbal with this nurse.   CBG reading "HI"

## 2021-10-15 NOTE — ED Provider Notes (Signed)
[facility] EMERGENCY DEPARTMENT Provider Note  CSN: [id number] Arrival date & time: 10/15/21 1638  Chief Complaint(s) Hyperglycemia and Altered Mental Status  HPI Devin [name] is a 51 y.o. male with PMH CHF, CAD status post DES, CVA with basilar artery stent, HTN, HLD, hypothyroidism who presents emergency department for evaluation of altered mental status and hyperglycemia.  Patient was hunting and fishing this morning and went to a flea market when he became suddenly altered and EMS was called.  Patient arrives altered unable to answer questions, will awaken to noxious stimuli.  Patient arrives significantly tachycardic in the 150s but maintaining appropriate blood pressures.  Patient 85% on room air.   Past Medical History Past Medical History:  Diagnosis Date   Asthma    Chest pain    CHF (congestive heart failure) (HCC)    Coronary artery disease    a. s/p DES x2 to LAD and DES to OM in 11/2017   Depression    Dyspnea    GERD (gastroesophageal reflux disease)    Headache    History of kidney stones    History of rhabdomyolysis    Hyperlipidemia    Hypertension    Hypothyroidism    Ischemic cardiomyopathy    a. 11/2017: echo showing EF of 35-40%, diffuse HK, and Grade 2 DD   Kidney calculus 2014   Pericardial effusion    Small, by dobutamine echocardiogram, 04/2006   PONV (postoperative nausea and vomiting)    Sleep apnea    does not wear cpap   Stroke (HCC)    Tobacco abuse    Type 2 diabetes mellitus without complications (HCC) 10/07/2013   Vitamin D deficiency    Patient Active Problem List   Diagnosis Date Noted   Nonadherence to medical treatment 06/15/2021   Vitamin D deficiency 12/11/2019   Statin intolerance 12/11/2019   Vertebral artery occlusion, right 10/27/2019   Arm numbness left 10/27/2019   Ulnar neuropathy of left upper extremity 10/27/2019   Left arm weakness 10/27/2019   Atypical chest pain 10/15/2019   Ischemic cardiomyopathy 10/02/2018    Vertebral basilar insufficiency 07/28/2018   Basilar artery stenosis, symptomatic, without infarction 02/25/2018   Acute cerebrovascular accident (CVA) (HCC) 02/10/2018   Chronic combined systolic and diastolic CHF (congestive heart failure) (HCC) 01/06/2018   Altered mental status 01/05/2018   History of stroke 01/05/2018   Basilar artery occlusion 12/03/2017   Cerebellar stroke (HCC)    TIA (transient ischemic attack) 11/30/2017   Abnormal cardiac CT angiography    Coronary artery disease    Chest pain due to CAD (HCC) 11/14/2017   Chest pain with moderate risk for cardiac etiology    CKD (chronic kidney disease) 11/13/2017   Hypothyroidism 10/07/2013   DM type 2 causing vascular disease (HCC) 10/07/2013   Rhabdomyolysis 10/06/2013   Acute renal failure (HCC) 10/06/2013   Elevated AST (SGOT) 10/06/2013   Elevated ALT measurement 10/06/2013   Chest pain    Current smoker    Essential hypertension, benign    Mixed hyperlipidemia    Pericardial effusion    History of rhabdomyolysis    Home Medication(s) Prior to Admission medications   Medication Sig Start Date End Date Taking? Authorizing Provider  aspirin 81 MG EC tablet Take 1 tablet (81 mg total) by mouth daily. With food 12/05/17   Shon Hale, MD  buPROPion (WELLBUTRIN SR) 150 MG 12 hr tablet Take 150 mg by mouth daily. 06/20/21   [provider]  clopidogrel (PLAVIX)  75 MG tablet Take 1 tablet (75 mg total) by mouth daily. 10/15/19   Murlean Iba, MD  Continuous Blood Gluc Receiver (FREESTYLE LIBRE 2 READER) DEVI USE AS DIRECTED 09/02/21   Cassandria Anger, MD  Continuous Blood Gluc Sensor (FREESTYLE LIBRE 2 SENSOR) MISC APPLY 1 PATCH TO SKIN EVERY 14 DAYS 09/02/21   Cassandria Anger, MD  docusate sodium (COLACE) 100 MG capsule Take 100 mg by mouth daily.    [provider]  DULERA 200-5 MCG/ACT AERO Inhale 2 puffs into the lungs 2 (two) times daily. 10/15/19   Johnson, Clanford L, MD   EUTHYROX 200 MCG tablet Take 200 mcg by mouth at bedtime. 05/13/20   [provider]  Evolocumab (REPATHA SURECLICK) [id number] MG/ML SOAJ Inject 1 pen into the skin every 14 (fourteen) days. 12/24/19   Arnoldo Lenis, MD  ezetimibe (ZETIA) 10 MG tablet Take 1 tablet (10 mg total) by mouth daily. 10/15/19   Johnson, Clanford L, MD  famotidine (PEPCID) 40 MG tablet Take 40 mg by mouth daily. 11/23/20   [provider]  fenofibrate (TRICOR) 145 MG tablet Take 1 tablet (145 mg total) by mouth daily. Patient not taking: Reported on 06/21/2021 10/15/19   Murlean Iba, MD  fluticasone (FLONASE) 50 MCG/ACT nasal spray Place 2 sprays into both nostrils 2 (two) times daily. 06/24/20   [provider]  furosemide (LASIX) 20 MG tablet TAKE 1 TABLET BY MOUTH AS NEEDED FOR  SWELLING Patient taking differently: Take 20 mg by mouth daily. 02/14/21   Arnoldo Lenis, MD  gabapentin (NEURONTIN) 300 MG capsule One po q AM, one po q PM and 2 po qHS Patient taking differently: Take 300 mg by mouth 2 (two) times daily. 12/23/19   Sater, Nanine Means, MD  glipiZIDE (GLUCOTROL XL) 10 MG 24 hr tablet Take 1 tablet (10 mg total) by mouth daily. 06/15/21   Cassandria Anger, MD  glucose blood (ACCU-CHEK GUIDE) test strip Test glucose 2 times a day 08/10/20   Cassandria Anger, MD  Insulin Lispro Prot & Lispro (HUMALOG MIX 75/25 KWIKPEN) (75-25) 100 UNIT/ML Kwikpen Inject 100 Units into the skin 2 (two) times daily with a meal. 07/13/21   Nida, Marella Chimes, MD  Insulin Pen Needle (B-D ULTRAFINE III SHORT PEN) 31G X 8 MM MISC Use as directed to inject insulin 2 times daily. 05/02/21   Cassandria Anger, MD  levothyroxine (SYNTHROID) 88 MCG tablet Take 88 mcg by mouth daily. 01/25/21   [provider]  methocarbamol (ROBAXIN) 500 MG tablet Take 1 tablet (500 mg total) by mouth every 8 (eight) hours as needed for muscle spasms. Patient not taking: Reported on 06/21/2021 04/16/21   Maudie Flakes, MD  nitroGLYCERIN (NITROSTAT) 0.4 MG SL tablet Place 1 tablet (0.4 mg total) under the tongue every 5 (five) minutes as needed for chest pain. 02/14/21   Arnoldo Lenis, MD  ondansetron (ZOFRAN-ODT) 4 MG disintegrating tablet Take 1 tablet (4 mg total) by mouth every 8 (eight) hours as needed for nausea or vomiting. 06/22/21   Maudie Flakes, MD  pantoprazole (PROTONIX) 40 MG tablet TAKE 1 TABLET BY MOUTH ONCE DAILY - NEED TO SCHEDULE AN APPOINTMENT Patient taking differently: Take 40 mg by mouth daily. 02/14/21   Arnoldo Lenis, MD  sitaGLIPtin (JANUVIA) 50 MG tablet Take 1 tablet (50 mg total) by mouth daily. 06/15/21   Cassandria Anger, MD  VASCEPA 1 g capsule Take 2  capsules (2 g total) by mouth 2 (two) times daily. 03/11/21   Arnoldo Lenis, MD  VENTOLIN HFA [phone] Base) MCG/ACT inhaler Inhale into the lungs. 11/23/20   [provider]                                                                                                                                    Past Surgical History Past Surgical History:  Procedure Laterality Date   APPENDECTOMY     CARDIAC CATHETERIZATION  09/2012   "Nonobstructive CAD with 30% proximal, 40% mid LAD disease; 30% proximal CFX; EF 55-65%"   CHOLECYSTECTOMY N/A 01/27/2013   Procedure: LAPAROSCOPIC CHOLECYSTECTOMY;  Surgeon: Jamesetta So, MD;  Location: [facility];  Service: General;  Laterality: N/A;   CORONARY STENT INTERVENTION N/A 11/15/2017   Procedure: CORONARY STENT INTERVENTION;  Surgeon: Leonie Man, MD;  Location: [place] [facility];  Service: Cardiovascular;  Laterality: N/A;   HERNIA REPAIR     As a child   INSERTION OF MESH N/A 11/13/2012   Procedure: INSERTION OF MESH;  Surgeon: Jamesetta So, MD;  Location: [facility];  Service: General;  Laterality: N/A;   IR ANGIO INTRA EXTRACRAN SEL COM CAROTID INNOMINATE BILAT MOD SED  12/01/2017   IR ANGIO INTRA EXTRACRAN SEL COM CAROTID INNOMINATE BILAT MOD SED  02/13/2018    IR ANGIO VERTEBRAL SEL SUBCLAVIAN INNOMINATE UNI L MOD SED  02/13/2018   IR ANGIO VERTEBRAL SEL VERTEBRAL BILAT MOD SED  12/01/2017   IR ANGIO VERTEBRAL SEL VERTEBRAL UNI R MOD SED  02/13/2018   IR INTRA CRAN STENT  12/03/2017   IR PTA INTRACRANIAL  02/25/2018   IR RADIOLOGIST EVAL & MGMT  07/29/2021   IR US GUIDE VASC ACCESS RIGHT  02/13/2018   KIDNEY STONE SURGERY     LEFT HEART CATH AND CORONARY ANGIOGRAPHY N/A 11/15/2017   Procedure: LEFT HEART CATH AND CORONARY ANGIOGRAPHY;  Surgeon: Leonie Man, MD;  Location: [place] [facility];  Service: Cardiovascular;  Laterality: N/A;   PERCUTANEOUS NEPHROLITHOTRIPSY     RADIOLOGY WITH ANESTHESIA Left 12/03/2017   Procedure: Angioplasty with possible stenting of left VBJ;  Surgeon: Luanne Bras, MD;  Location: [place];  Service: Radiology;  Laterality: Left;   RADIOLOGY WITH ANESTHESIA N/A 02/25/2018   Procedure: STENT PLACEMENT;  Surgeon: Luanne Bras, MD;  Location: [place];  Service: Radiology;  Laterality: N/A;   UMBILICAL HERNIA REPAIR N/A 11/13/2012   Procedure: UMBILICAL HERNIORRHAPHY;  Surgeon: Jamesetta So, MD;  Location: [facility];  Service: General;  Laterality: N/A;   VARICOCELECTOMY     Family History Family History  Problem Relation Age of Onset   Coronary artery disease Father    Hypertension Father    Hyperlipidemia Father    Diabetes Father    Congestive Heart Failure Father 97   Arrhythmia Father        had an  ICD   Diabetes Mother    Hypertension Mother    Hyperlipidemia Mother    Obesity Mother 71       died after bariatric surgery, liver failure and infection   Coronary artery disease Maternal Grandfather        both grandfathers and several uncles   Coronary artery disease Other    Diabetes Sister        both sisters   Stroke Maternal Aunt    Stroke Maternal Uncle     Social History Social History   Tobacco Use   Smoking status: Every Day    Packs/day: 1.00    Years: 26.00    Total pack years:  26.00    Types: Cigarettes    Start date: 03/13/1985   Smokeless tobacco: Former    Types: Snuff, Chew    Quit date: 03/14/1987  Vaping Use   Vaping Use: Former   Devices: quit in 2011  Substance Use Topics   Alcohol use: No   Drug use: No   Allergies Crestor [rosuvastatin], Trulicity [dulaglutide], and Bee venom  Review of Systems Review of Systems  Unable to perform ROS: Mental status change    Physical Exam Vital Signs  I have reviewed the triage vital signs BP 129/81   Pulse (!) 113   Temp (!) 102 F (38.9 C) (Rectal)   Resp 18   SpO2 97%   Physical Exam Constitutional:      General: He is not in acute distress.    Appearance: Normal appearance. He is toxic-appearing and diaphoretic.  HENT:     Head: Normocephalic and atraumatic.     Nose: No congestion or rhinorrhea.  Eyes:     General:        Right eye: No discharge.        Left eye: No discharge.     Extraocular Movements: Extraocular movements intact.     Pupils: Pupils are equal, round, and reactive to light.  Cardiovascular:     Rate and Rhythm: Regular rhythm. Tachycardia present.     Heart sounds: No murmur heard. Pulmonary:     Effort: No respiratory distress.     Breath sounds: No wheezing or rales.  Abdominal:     General: There is no distension.     Tenderness: There is no abdominal tenderness.  Musculoskeletal:        General: Normal range of motion.     Cervical back: Normal range of motion.  Skin:    General: Skin is warm.  Neurological:     General: No focal deficit present.     Mental Status: He is alert.     ED Results and Treatments Labs (all labs ordered are listed, but only abnormal results are displayed) Labs Reviewed  COMPREHENSIVE METABOLIC PANEL - Abnormal; Notable for the following components:      Result Value   Sodium 124 (*)    Chloride 90 (*)    Glucose, Bld 692 (*)    Creatinine, Ser 1.51 (*)    AST 42 (*)    ALT 71 (*)    Alkaline Phosphatase 137 (*)    GFR,  Estimated 56 (*)    All other components within normal limits  LACTIC ACID, PLASMA - Abnormal; Notable for the following components:   Lactic Acid, Venous 2.8 (*)    All other components within normal limits  CBC WITH DIFFERENTIAL/PLATELET - Abnormal; Notable for the following components:   WBC 12.6 (*)  Neutro Abs 9.0 (*)    All other components within normal limits  BLOOD GAS, VENOUS - Abnormal; Notable for the following components:   pH, Ven 7.45 (*)    pCO2, Ven 37 (*)    pO2, Ven 93 (*)    Acid-Base Excess 2.2 (*)    All other components within normal limits  CBG MONITORING, ED - Abnormal; Notable for the following components:   Glucose-Capillary >600 (*)    All other components within normal limits  I-STAT CHEM 8, ED - Abnormal; Notable for the following components:   Sodium 127 (*)    Chloride 94 (*)    Creatinine, Ser 1.40 (*)    Glucose, Bld >700 (*)    Hemoglobin 17.3 (*)    All other components within normal limits  CULTURE, BLOOD (ROUTINE X 2)  CULTURE, BLOOD (ROUTINE X 2)  PROTIME-INR  BETA-HYDROXYBUTYRIC ACID  URINALYSIS, ROUTINE W REFLEX MICROSCOPIC  OSMOLALITY  CK  TSH  LACTIC ACID, PLASMA                                                                                                                          Radiology CT ANGIO HEAD NECK W WO CM  Result Date: 10/15/2021 CLINICAL DATA:  Neuro deficit, acute, stroke suspected. Altered mental status. EXAM: CT ANGIOGRAPHY HEAD AND NECK TECHNIQUE: Multidetector CT imaging of the head and neck was performed using the standard protocol during bolus administration of intravenous contrast. Multiplanar CT image reconstructions and MIPs were obtained to evaluate the vascular anatomy. Carotid stenosis measurements (when applicable) are obtained utilizing NASCET criteria, using the distal internal carotid diameter as the denominator. RADIATION DOSE REDUCTION: This exam was performed according to the departmental  dose-optimization program which includes automated exposure control, adjustment of the mA and/or kV according to patient size and/or use of iterative reconstruction technique. CONTRAST:  188mL OMNIPAQUE IOHEXOL 350 MG/ML SOLN COMPARISON:  Head and neck CTA 06/21/2021 FINDINGS: CTA NECK FINDINGS Aortic arch: Standard 3 vessel aortic arch with widely patent arch vessel origins. Luminal irregularity in the proximal left subclavian artery due to soft plaque without significant stenosis. Right carotid system: Patent with a small amount of calcified and soft plaque at the carotid bifurcation. No evidence of a significant stenosis or dissection. Left carotid system: Patent with mixed calcified and soft plaque at the carotid bifurcation resulting in 65% stenosis of the ICA origin, not significantly changed from the prior CTA upon remeasurement. Vertebral arteries: Patent and codominant without evidence of a significant stenosis or dissection within limitations of shoulder artifact which results in suboptimal assessment of the V1 segments. Skeleton: No acute osseous abnormality or suspicious osseous lesion. Other neck: Asymmetric fatty atrophy of the right parotid gland. No evidence of cervical lymphadenopathy or mass. Upper chest: Mild paraseptal emphysema in the lung apices. Review of the MIP images confirms the above findings CTA HEAD FINDINGS Anterior circulation: The internal carotid arteries are widely patent from skull base to carotid termini.  ACAs and MCAs are patent without evidence of a proximal branch occlusion or significant proximal stenosis. No aneurysm is identified. Posterior circulation: The intracranial right vertebral artery is patent with unchanged moderate stenosis distally. The intracranial left vertebral artery is patent with a stent again noted extending from the left V4 segment to the mid to distal basilar artery. The stent is patent, however there is a similar appearance of a suspected severe  stenosis within the proximal aspect of the stent compared to the prior CTA. The PCAs are patent with mild proximal P1 stenoses bilaterally. No aneurysm is identified. Venous sinuses: Patent. Anatomic variants: None. Review of the MIP images confirms the above findings IMPRESSION: 1. No large vessel occlusion. 2. 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 3. Patent stent extending from the left V4 segment into the basilar artery with an unchanged severe stenosis in the proximal aspect of the stent. 4. Unchanged moderate distal right V4 stenosis. 5. Aortic Atherosclerosis (ICD10-I70.0) and Emphysema (ICD10-J43.9). Electronically Signed   By: Sebastian Ache M.D.   On: 10/15/2021 18:14   CT Head Wo Contrast  Result Date: 10/15/2021 CLINICAL DATA:  Altered mental status. EXAM: CT HEAD WITHOUT CONTRAST TECHNIQUE: Contiguous axial images were obtained from the base of the skull through the vertex without intravenous contrast. RADIATION DOSE REDUCTION: This exam was performed according to the departmental dose-optimization program which includes automated exposure control, adjustment of the mA and/or kV according to patient size and/or use of iterative reconstruction technique. COMPARISON:  Head and neck CTA 06/21/2021.  Head MRI 11/18/2019. FINDINGS: Brain: There is no evidence of an acute infarct, intracranial hemorrhage, mass, midline shift, or extra-axial fluid collection. 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 The ventricles are normal in size. Vascular: Vertebrobasilar stent. Skull: No fracture or suspicious osseous lesion. 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 Clear mastoid air cells. Unremarkable orbits. Other: None. IMPRESSION: 1. No evidence of acute intracranial abnormality. 2. Chronic left cerebellar infarct. Electronically Signed   By: Sebastian Ache M.D.   On: 10/15/2021 17:57   CT Angio Chest/Abd/Pel for Dissection W and/or Wo Contrast  Result Date:  10/15/2021 CLINICAL DATA:  Chest pain and back pain. Aortic dissection suspected. EXAM: CT ANGIOGRAPHY CHEST, ABDOMEN AND PELVIS TECHNIQUE: Non-contrast CT of the chest was initially obtained. Multidetector CT imaging through the chest, abdomen and pelvis was performed using the standard protocol during bolus administration of intravenous contrast. Multiplanar reconstructed images and MIPs were obtained and reviewed to evaluate the vascular anatomy. RADIATION DOSE REDUCTION: This exam was performed according to the departmental dose-optimization program which includes automated exposure control, adjustment of the mA and/or kV according to patient size and/or use of iterative reconstruction technique. CONTRAST:  OMNIPAQUE IOHEXOL 350 MG/ML SOLN COMPARISON:  Abdominal CT June 21, 2021 FINDINGS: CTA CHEST FINDINGS Cardiovascular: Preferential opacification of the thoracic aorta. No evidence of thoracic aortic aneurysm or dissection. Normal heart size. No pericardial effusion. Mediastinum/Nodes: No enlarged mediastinal, hilar, or axillary lymph nodes. Thyroid gland, trachea, and esophagus demonstrate no significant findings. Lungs/Pleura: Mild upper lobe predominant emphysema. Lower lobe predominant atelectasis versus interstitial lung changes. Musculoskeletal: No chest wall abnormality. No acute or significant osseous findings. Review of the MIP images confirms the above findings. CTA ABDOMEN AND PELVIS FINDINGS VASCULAR Aorta: Normal caliber aorta without aneurysm, dissection, vasculitis or significant stenosis. Celiac: Patent without evidence of aneurysm, dissection, vasculitis or significant stenosis. SMA: Patent without evidence of aneurysm, dissection, vasculitis or significant stenosis. Renals: Both renal arteries are patent  without evidence of aneurysm, dissection, vasculitis, fibromuscular dysplasia or significant stenosis. IMA: Patent without evidence of aneurysm, dissection, vasculitis or significant  stenosis. Inflow: Patent without evidence of aneurysm, dissection, vasculitis or significant stenosis. Veins: No obvious venous abnormality within the limitations of this arterial phase study. Review of the MIP images confirms the above findings. NON-VASCULAR Hepatobiliary: Hepatic steatosis. Post cholecystectomy. No evidence of biliary ductal dilation. Pancreas: Unremarkable. No pancreatic ductal dilatation or surrounding inflammatory changes. Spleen: Normal in size without focal abnormality. Adrenals/Urinary Tract: Normal adrenal glands. No evidence of hydronephrosis or nephrolithiasis. The previously seen right nephrolithiasis may be obscured by excreted contrast within the collecting systems of the kidneys. 1.1 cm intermediate density hypoattenuated mass in the lower pole of the left kidney, attenuation of 25.8 Hounsfield units. Normal urinary bladder. Stomach/Bowel: Stomach is within normal limits. Appendix appears normal. No evidence of bowel wall thickening, distention, or inflammatory changes. Lymphatic: No evidence upper the. Mild atherosclerosis of the aorta and bilateral iliac arteries with calcified and noncalcified plaque. No flow limiting stenosis. Reproductive: Prostate is unremarkable. Other: No abdominal wall hernia or abnormality. No abdominopelvic ascites. Musculoskeletal: No acute or significant osseous findings. Review of the MIP images confirms the above findings. IMPRESSION: 1. No evidence of thoracic or abdominal aortic aneurysm or dissection. 2. Mild atherosclerosis of the aorta and bilateral iliac arteries, without flow limiting stenosis. 3. Hepatic steatosis. 4. 1.1 cm intermediate density hypoattenuated mass in the lower pole of the left kidney. This may represent a hemorrhagic or proteinaceous cyst, however further evaluation with nonemergent renal ultrasound may be considered. Electronically Signed   By: Fidela Salisbury M.D.   On: 10/15/2021 17:55   DG Chest Portable 1  View  Result Date: 10/15/2021 CLINICAL DATA:  Sepsis, diabetes EXAM: PORTABLE CHEST 1 VIEW COMPARISON:  03/26/2021 FINDINGS: Single frontal view of the chest demonstrates unremarkable cardiac silhouette. No acute airspace disease, effusion, or pneumothorax. No acute bony abnormality. IMPRESSION: 1. No acute intrathoracic process. Electronically Signed   By: Randa Ngo M.D.   On: 10/15/2021 17:23    Pertinent labs & imaging results that were available during my care of the patient were reviewed by me and considered in my medical decision making (see MDM for details).  Medications Ordered in ED Medications  naloxone (NARCAN) injection 2 mg (has no administration in time range)  ceFEPIme (MAXIPIME) 2 g in sodium chloride 0.9 % 100 mL IVPB (0 g Intravenous Stopped 10/15/21 1813)  vancomycin (VANCOREADY) IVPB 2000 mg/400 mL (2,000 mg Intravenous New Bag/Given 10/15/21 1745)  vancomycin (VANCOCIN) IVPB 1000 mg/200 mL premix (has no administration in time range)  lactated ringers bolus 1,000 mL (has no administration in time range)  lactated ringers bolus 1,000 mL (1,000 mLs Intravenous New Bag/Given 10/15/21 1742)  naloxone (NARCAN) 0.4 MG/ML injection (0.4 mg  Given 10/15/21 1710)  iohexol (OMNIPAQUE) 350 MG/ML injection 150 mL (150 mLs Intravenous Contrast Given 10/15/21 1738)  Procedures .Critical Care  Performed by: Teressa Lower, MD Authorized by: Teressa Lower, MD   Critical care provider statement:    Critical care time (minutes):  80   Critical care was time spent personally by me on the following activities:  Development of treatment plan with patient or surrogate, discussions with consultants, evaluation of patient's response to treatment, examination of patient, ordering and review of laboratory studies, ordering and review of radiographic studies, ordering and  performing treatments and interventions, pulse oximetry, re-evaluation of patient's condition and review of old charts   (including critical care time)  Medical Decision Making / ED Course   This patient presents to the ED for concern of altered mental status, hyperglycemia, this involves an extensive number of treatment options, and is a complaint that carries with it a high risk of complications and morbidity.  The differential diagnosis includes DKA, HHS, intracranial bleed, stent malfunction, aortic dissection, sepsis, heatstroke  MDM: Patient seen emergency room for evaluation of multiple complaints described above.  Physical exam reveals a toxic diaphoretic appearing patient who is altered complaining of a headache.  Initial temperature febrile to 100.2 and tachycardic to 140.  Broad-spectrum antibiotics and fluid resuscitation begun.  Initial i-STAT with a creatinine of 1.4, glucose greater than 700 and sodium 127.  However, VBG with pH 7.45 and beta-hydroxybutyrate is normal and I have low suspicion for DKA.  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  After 2 L of fluid, patients tachycardia has significantly improved but remains encephalopathic.  Surprisingly, his fever also seems to have spontaneously resolved.  TSH is normal and thus I have lower suspicion for thyroid storm.  At this point I have higher suspicion for heatstroke as the source of patient's initial presentation but his current encephalopathy remains unclear.  Patient require hospital admission for further work-up.   Additional history obtained: -Additional history obtained from multiple family members -External records from outside source obtained and reviewed including: Chart review including previous notes,  labs, imaging, consultation notes   Lab Tests: -I ordered, reviewed, and interpreted labs.   The pertinent results include:   Labs Reviewed  COMPREHENSIVE METABOLIC PANEL - Abnormal; Notable for the following components:      Result Value   Sodium 124 (*)    Chloride 90 (*)    Glucose, Bld 692 (*)    Creatinine, Ser 1.51 (*)    AST 42 (*)    ALT 71 (*)    Alkaline Phosphatase 137 (*)    GFR, Estimated 56 (*)    All other components within normal limits  LACTIC ACID, PLASMA - Abnormal; Notable for the following components:   Lactic Acid, Venous 2.8 (*)    All other components within normal limits  CBC WITH DIFFERENTIAL/PLATELET - Abnormal; Notable for the following components:   WBC 12.6 (*)    Neutro Abs 9.0 (*)    All other components within normal limits  BLOOD GAS, VENOUS - Abnormal; Notable for the following components:   pH, Ven 7.45 (*)    pCO2, Ven 37 (*)    pO2, Ven 93 (*)    Acid-Base Excess 2.2 (*)    All other components within normal limits  CBG MONITORING, ED - Abnormal; Notable for the following components:   Glucose-Capillary >  600 (*)    All other components within normal limits  I-STAT CHEM 8, ED - Abnormal; Notable for the following components:   Sodium 127 (*)    Chloride 94 (*)    Creatinine, Ser 1.40 (*)    Glucose, Bld >700 (*)    Hemoglobin 17.3 (*)    All other components within normal limits  CULTURE, BLOOD (ROUTINE X 2)  CULTURE, BLOOD (ROUTINE X 2)  PROTIME-INR  BETA-HYDROXYBUTYRIC ACID  URINALYSIS, ROUTINE W REFLEX MICROSCOPIC  OSMOLALITY  CK  TSH  LACTIC ACID, PLASMA      EKG   EKG Interpretation  Date/Time:  Saturday October 15 2021 16:48:48 EDT Ventricular Rate:  145 PR Interval:  138 QRS Duration: 94 QT Interval:  358 QTC Calculation: 556 R Axis:   113 Text Interpretation: Sinus tachycardia Left posterior fascicular block Abnormal ECG When compared with ECG of 15-Apr-2021 23:35, PREVIOUS ECG IS PRESENT Confirmed by [place] ([id number]) on 10/15/2021 5:18:19 PM         Imaging Studies ordered: I ordered imaging studies including chest x-ray, CT head, CT angio brain and neck, CT dissection study I independently visualized and interpreted imaging. I agree with the radiologist interpretation   Medicines ordered and prescription drug management: Meds ordered this encounter  Medications   lactated ringers bolus 1,000 mL   naloxone (NARCAN) injection 2 mg   naloxone (NARCAN) 0.4 MG/ML injection    Montee, William L: cabinet override   ceFEPIme (MAXIPIME) 2 g in sodium chloride 0.9 % 100 mL IVPB    Order Specific Question:   Antibiotic Indication:    Answer:   Sepsis   vancomycin (VANCOREADY) IVPB 2000 mg/400 mL    Order Specific Question:   Indication:    Answer:   Sepsis   vancomycin (VANCOCIN) IVPB 1000 mg/200 mL premix    Order Specific Question:   Indication:    Answer:   Sepsis   iohexol (OMNIPAQUE) 350 MG/ML injection 150 mL   lactated ringers bolus 1,000 mL    -I have reviewed the patients home medicines and have made adjustments as needed  Critical interventions Fluid resuscitation, broad-spectrum antibiotics, insulin, supplemental oxygen  Cardiac Monitoring: The patient was maintained on a cardiac monitor.  I personally viewed and interpreted the cardiac monitored which showed an underlying rhythm of: Sinus tachycardia  Social Determinants of Health:  Factors impacting patients care include: none   Reevaluation: After the interventions noted above, I reevaluated the patient and found that they have :improved  Co morbidities that complicate the patient evaluation  Past Medical History:  Diagnosis Date   Asthma    Chest pain    CHF (congestive heart failure) ([place])    Coronary artery disease    a. s/p DES x2 to LAD and DES to OM in 11/2017   Depression    Dyspnea    GERD (gastroesophageal reflux disease)    Headache    History of kidney stones    History of rhabdomyolysis     Hyperlipidemia    Hypertension    Hypothyroidism    Ischemic cardiomyopathy    a. 11/2017: echo showing EF of 35-40%, diffuse HK, and Grade 2 DD   Kidney calculus 2014   Pericardial effusion    Small, by dobutamine echocardiogram, 04/2006   PONV (postoperative nausea and vomiting)    Sleep apnea    does not wear cpap   Stroke ([place])    Tobacco abuse    Type 2 diabetes  mellitus without complications ([place]) [id number]   Vitamin D deficiency       Dispostion: I considered admission for this patient, and due to persistent encephalopathy, patient will require hospital admission     Final Clinical Impression(s) / ED Diagnoses Final diagnoses:  None     @PCDICTATION @    Teressa Lower, MD 10/15/21 [phone]

## 2021-10-16 ENCOUNTER — Inpatient Hospital Stay (HOSPITAL_COMMUNITY): Payer: Medicare Other

## 2021-10-16 DIAGNOSIS — E1100 Type 2 diabetes mellitus with hyperosmolarity without nonketotic hyperglycemic-hyperosmolar coma (NKHHC): Secondary | ICD-10-CM | POA: Diagnosis not present

## 2021-10-16 DIAGNOSIS — U071 COVID-19: Secondary | ICD-10-CM

## 2021-10-16 DIAGNOSIS — N179 Acute kidney failure, unspecified: Secondary | ICD-10-CM | POA: Diagnosis not present

## 2021-10-16 DIAGNOSIS — R4182 Altered mental status, unspecified: Secondary | ICD-10-CM | POA: Diagnosis not present

## 2021-10-16 DIAGNOSIS — A419 Sepsis, unspecified organism: Secondary | ICD-10-CM

## 2021-10-16 DIAGNOSIS — G9341 Metabolic encephalopathy: Secondary | ICD-10-CM

## 2021-10-16 DIAGNOSIS — E1165 Type 2 diabetes mellitus with hyperglycemia: Secondary | ICD-10-CM

## 2021-10-16 DIAGNOSIS — I5042 Chronic combined systolic (congestive) and diastolic (congestive) heart failure: Secondary | ICD-10-CM | POA: Diagnosis not present

## 2021-10-16 DIAGNOSIS — G9340 Encephalopathy, unspecified: Secondary | ICD-10-CM

## 2021-10-16 DIAGNOSIS — A4189 Other specified sepsis: Secondary | ICD-10-CM | POA: Diagnosis not present

## 2021-10-16 DIAGNOSIS — R6520 Severe sepsis without septic shock: Secondary | ICD-10-CM

## 2021-10-16 LAB — COMPREHENSIVE METABOLIC PANEL
ALT: 42 U/L (ref 0–44)
AST: 24 U/L (ref 15–41)
Albumin: 2.8 g/dL — ABNORMAL LOW (ref 3.5–5.0)
Alkaline Phosphatase: 97 U/L (ref 38–126)
Anion gap: 11 (ref 5–15)
BUN: 11 mg/dL (ref 6–20)
CO2: 17 mmol/L — ABNORMAL LOW (ref 22–32)
Calcium: 8.3 mg/dL — ABNORMAL LOW (ref 8.9–10.3)
Chloride: 107 mmol/L (ref 98–111)
Creatinine, Ser: 0.96 mg/dL (ref 0.61–1.24)
GFR, Estimated: >60 mL/min (ref >60)
Glucose, Bld: 334 mg/dL — ABNORMAL HIGH (ref 70–99)
Potassium: 4 mmol/L (ref 3.5–5.1)
Sodium: 135 mmol/L (ref 135–145)
Total Bilirubin: 0.9 mg/dL (ref 0.3–1.2)
Total Protein: 5.3 g/dL — ABNORMAL LOW (ref 6.5–8.1)

## 2021-10-16 LAB — GLUCOSE, CAPILLARY
Glucose-Capillary: 414 mg/dL — ABNORMAL HIGH (ref 70–99)
Glucose-Capillary: 269 mg/dL — ABNORMAL HIGH (ref 70–99)
Glucose-Capillary: 221 mg/dL — ABNORMAL HIGH (ref 70–99)
Glucose-Capillary: 189 mg/dL — ABNORMAL HIGH (ref 70–99)
Glucose-Capillary: 345 mg/dL — ABNORMAL HIGH (ref 70–99)

## 2021-10-16 LAB — CBC
HCT: 38.3 % — ABNORMAL LOW (ref 39.0–52.0)
Hemoglobin: 13.2 g/dL (ref 13.0–17.0)
MCH: 29.1 pg (ref 26.0–34.0)
MCHC: 34.5 g/dL (ref 30.0–36.0)
MCV: 84.5 fL (ref 80.0–100.0)
Platelets: 216 10*3/uL (ref 150–400)
RBC: 4.53 MIL/uL (ref 4.22–5.81)
RDW: 13.9 % (ref 11.5–15.5)
WBC: 9.5 10*3/uL (ref 4.0–10.5)
nRBC: 0 % (ref 0.0–0.2)

## 2021-10-16 LAB — C-REACTIVE PROTEIN: CRP: 4.4 mg/dL — ABNORMAL HIGH (ref <1.0)

## 2021-10-16 LAB — FERRITIN: Ferritin: 299 ng/mL (ref 24–336)

## 2021-10-16 LAB — D-DIMER, QUANTITATIVE: D-Dimer, Quant: 0.45 ug/mL-FEU (ref 0.00–0.50)

## 2021-10-16 LAB — HEMOGLOBIN A1C
Hgb A1c MFr Bld: 14.7 % — ABNORMAL HIGH (ref 4.8–5.6)
Mean Plasma Glucose: 375.19 mg/dL

## 2021-10-16 MED ORDER — INSULIN ASPART 100 UNIT/ML IJ SOLN
20.0000 [IU] | Freq: Once | INTRAMUSCULAR | Status: AC
Start: 2021-10-16 — End: 2021-10-16
  Administered 2021-10-16: 20 [IU] via SUBCUTANEOUS

## 2021-10-16 MED ORDER — LEVOTHYROXINE SODIUM 100 MCG PO TABS
200.0000 ug | ORAL_TABLET | Freq: Every day | ORAL | Status: DC
  Administered 2021-10-17: 200 ug via ORAL
  Filled 2021-10-16: qty 2

## 2021-10-16 MED ORDER — BARICITINIB 2 MG PO TABS
4.0000 mg | ORAL_TABLET | Freq: Every day | ORAL | Status: DC
  Administered 2021-10-16 – 2021-10-17 (×2): 4 mg via ORAL
  Filled 2021-10-16 (×2): qty 2

## 2021-10-16 MED ORDER — VANCOMYCIN HCL 1750 MG/350ML IV SOLN
1750.0000 mg | Freq: Two times a day (BID) | INTRAVENOUS | Status: DC
  Administered 2021-10-16 – 2021-10-17 (×2): 1750 mg via INTRAVENOUS
  Filled 2021-10-16 (×3): qty 350

## 2021-10-16 NOTE — Progress Notes (Signed)
Pharmacy Antibiotic Note  Devin [name] is a 51 y.o. male admitted on 10/15/2021 with sepsis.  Pharmacy has been consulted for vancomycin and cefepime dosing. Pt is currently afebrile and WBCs 12.6K on 8/5. Renal function improving, Scr currently 1.   Plan: Increase vancomycin to 1750 mg IV every 12 hours, starting with next dose at 2000   [facility] 416.8 using Scr 1, 116.3 kg)  Cefepime 2g IV every 8 hours Monitor renal function, c/s, vancomycin level as indicated.  Height: 6\' 6"  (198.1 cm) Weight: 116.3 kg (256 lb 6.4 oz) IBW/kg (Calculated) : 91.4  Temp (24hrs), Avg:98.8 F (37.1 C), Min:97.8 F (36.6 C), Max:102 F (38.9 C)  Recent Labs  Lab 10/15/21 1656 10/15/21 1703 10/15/21 1828 10/15/21 2318  WBC 12.6*  --   --   --   CREATININE 1.51* 1.40*  --  1.00  LATICACIDVEN 2.8*  --  1.9  --      Estimated Creatinine Clearance: 128.2 mL/min (by C-G formula based on SCr of 1 mg/dL).    Allergies  Allergen Reactions   Crestor [Rosuvastatin] Other (See Comments)    rhabdomyolosis   Trulicity [Dulaglutide] Nausea And Vomiting   Bee Venom Swelling    SWELLING REACTION UNSPECIFIED     Antimicrobials this admission: Vancomycin 8/5 >> Cefepime 8/5 >>  Microbiology results: 8/5 BCx: NGTD 8/5 SARS-CoV-2 pcr- positive    10/5, PharmD PGY1 Pharmacy Resident   10/16/2021  1:22 PM

## 2021-10-16 NOTE — Progress Notes (Signed)
Pt stated he doesn't have a CPAP at home. Pt doesn't want to wear CPAP for the night.

## 2021-10-16 NOTE — Progress Notes (Addendum)
[facility] Progress Note  Patient: Devin [name]     ZHG:[id number]  DOA: 10/15/2021   PCP: Donetta Potts, MD       Brief hospital course:   This is a 51 year old male with CAD with stents, ischemic cardiomyopathy with systolic and diastolic heart failure with improved EF, type 2 diabetes mellitus, obesity, OSA not compliant with CPAP, hypertension, hyperlipidemia, hypothyroidism. When the patient was driving with his friend, he began to shake and was noncommunicative.  When EMS arrived, the patient was lethargic and unable to answer questions.  He awaken to noxious stimuli. In the ED heart rate was in the 150s, pulse ox was 95% on room air.  Temperature rose to 102. Glucose was 692, creatinine 1.51 (baseline 1.0), sodium 124. WBC 12.3. Added on vancomycin and cefepime.  Subjective:  He does not remember much of the events preceding coming in to the hospital. He feels weak and had a headache.   Assessment and Plan: Principal Problem:   Lab test positive for detection of COVID-19 virus   SIRS (systemic inflammatory response syndrome) (HCC) -COVID-positive - RR in 20s- he ha a cough but his cough but he states he has a chronic cough - CTA negative for infiltrates - check CRP, D dimer, Ferritin - start Baricitanib as he is high risk for decompensation- avoid steroids as sugars very high - f/u blood cultures for another 24 hr before stopping antibiotics - cont IVF as glucose is elevated and he is still dehydrated on exam  Active Problems:   DM (diabetes mellitus), type 2, uncontrolled, with hyperosmolarity  Hyperosmolar hyperglycemic state    - A1c 14.7 - suspect high sugars are related to COVID  - glucose 301 - he states he takes his medications as ordered but does not adhere to a strict carb modified diet - cont insulin- he takes 75/25 at home 88U BID - consult diabetes coordinator    Acute metabolic encephalopathy CT without contrast, MRI and CTA head and neck  unremarkable - resolved  COPD - no wheezing noted    AKI (acute kidney injury), hyponatremia, dehydration - CR 1.40 - improved to 1.00 today - will cont NS infusion @ 75 cc/hr    Chronic combined systolic and diastolic CHF (congestive heart failure)  - uses Lasix PRN at home-  - see above  CAD - cont ASA, Plavix, Zetia, Vascepa  Hypothyroid - cont Euthrox and Levothroid       DVT prophylaxis:  heparin injection 5,000 Units Start: 10/15/21 2300 SCDs Start: 10/15/21 2208 Place TED hose Start: 10/15/21 2208     Code Status: Full Code  Consultants: none Level of Care: Level of care: Telemetry Medical Disposition Plan:  Status is: Inpatient Remains inpatient appropriate because: dehydrated  Objective:   Vitals:   10/16/21 0258 10/16/21 0554 10/16/21 0824 10/16/21 1334  BP:  (!) 96/45 (!) 105/55 (!) 106/59  Pulse:  66 80 67  Resp:  18 20 20   Temp:  98.4 F (36.9 C) 98.1 F (36.7 C) 98.9 F (37.2 C)  TempSrc:  Oral Oral Oral  SpO2: 92% 100% 95% 96%  Weight:  116.3 kg    Height:       Filed Weights   10/15/21 2241 10/16/21 0554  Weight: 114.9 kg 116.3 kg   Exam: General exam: Appears comfortable  HEENT: PERRLA, oral mucosa moist, no sclera icterus or thrush Respiratory system: Clear to auscultation. Respiratory effort normal. Cardiovascular system: S1 & S2 heard, regular rate and rhythm  Gastrointestinal system: Abdomen soft, non-tender, nondistended. Normal bowel sounds   Central nervous system: Alert and oriented. No focal neurological deficits. Extremities: No cyanosis, clubbing or edema Skin: No rashes or ulcers Psychiatry:  Mood & affect appropriate.    Imaging and lab data was personally reviewed    CBC: Recent Labs  Lab 10/15/21 1656 10/15/21 1703  WBC 12.6*  --   NEUTROABS 9.0*  --   HGB 15.9 17.3*  HCT 46.6 51.0  MCV 83.4  --   PLT 298  --    Basic Metabolic Panel: Recent Labs  Lab 10/15/21 1656 10/15/21 1703 10/15/21 2318  NA 124*  127* 131*  K 4.6 4.9 3.9  CL 90* 94* 99  CO2 22  --  23  GLUCOSE 692* >700* 301*  BUN 16 16 11   CREATININE 1.51* 1.40* 1.00  CALCIUM 9.8  --  8.8*  PHOS  --   --  3.2   GFR: Estimated Creatinine Clearance: 128.2 mL/min (by C-G formula based on SCr of 1 mg/dL).  Scheduled Meds:  aspirin EC  81 mg Oral Daily   baricitinib  4 mg Oral Daily   clopidogrel  75 mg Oral Daily   ezetimibe  10 mg Oral Daily   fenofibrate  160 mg Oral Daily   heparin  5,000 Units Subcutaneous Q8H   icosapent Ethyl  2 g Oral BID   insulin aspart  0-15 Units Subcutaneous QHS   insulin aspart  0-20 Units Subcutaneous TID WC   insulin aspart  4 Units Subcutaneous TID WC   insulin detemir  40 Units Subcutaneous BID   [START ON 10/17/2021] levothyroxine  200 mcg Oral QAC breakfast   levothyroxine  88 mcg Oral QAC breakfast   metoprolol tartrate  25 mg Oral BID   mometasone-formoterol  2 puff Inhalation BID   pantoprazole  40 mg Oral Daily   senna-docusate  2 tablet Oral QHS   sodium chloride flush  3 mL Intravenous Q12H   sodium chloride flush  3 mL Intravenous Q12H   Continuous Infusions:  sodium chloride 150 mL/hr at 10/16/21 0649   sodium chloride     ceFEPime (MAXIPIME) IV 2 g (10/16/21 1201)   vancomycin       LOS: 1 day   Author: 12/16/21  10/16/2021 3:49 PM

## 2021-10-17 ENCOUNTER — Other Ambulatory Visit (HOSPITAL_COMMUNITY): Payer: Self-pay

## 2021-10-17 DIAGNOSIS — R4182 Altered mental status, unspecified: Secondary | ICD-10-CM | POA: Diagnosis not present

## 2021-10-17 DIAGNOSIS — I5042 Chronic combined systolic (congestive) and diastolic (congestive) heart failure: Secondary | ICD-10-CM | POA: Diagnosis not present

## 2021-10-17 DIAGNOSIS — U071 COVID-19: Secondary | ICD-10-CM | POA: Diagnosis not present

## 2021-10-17 DIAGNOSIS — A419 Sepsis, unspecified organism: Secondary | ICD-10-CM | POA: Diagnosis not present

## 2021-10-17 DIAGNOSIS — N179 Acute kidney failure, unspecified: Secondary | ICD-10-CM | POA: Diagnosis not present

## 2021-10-17 DIAGNOSIS — R6520 Severe sepsis without septic shock: Secondary | ICD-10-CM | POA: Diagnosis not present

## 2021-10-17 DIAGNOSIS — E1165 Type 2 diabetes mellitus with hyperglycemia: Secondary | ICD-10-CM | POA: Diagnosis not present

## 2021-10-17 DIAGNOSIS — E1100 Type 2 diabetes mellitus with hyperosmolarity without nonketotic hyperglycemic-hyperosmolar coma (NKHHC): Secondary | ICD-10-CM | POA: Diagnosis not present

## 2021-10-17 DIAGNOSIS — G9341 Metabolic encephalopathy: Secondary | ICD-10-CM | POA: Diagnosis not present

## 2021-10-17 LAB — CBC
HCT: 39.8 % (ref 39.0–52.0)
Hemoglobin: 13.5 g/dL (ref 13.0–17.0)
MCH: 28.4 pg (ref 26.0–34.0)
MCHC: 33.9 g/dL (ref 30.0–36.0)
MCV: 83.8 fL (ref 80.0–100.0)
Platelets: 221 10*3/uL (ref 150–400)
RBC: 4.75 MIL/uL (ref 4.22–5.81)
RDW: 14.2 % (ref 11.5–15.5)
WBC: 7 10*3/uL (ref 4.0–10.5)
nRBC: 0 % (ref 0.0–0.2)

## 2021-10-17 LAB — BASIC METABOLIC PANEL
Anion gap: 9 (ref 5–15)
BUN: 10 mg/dL (ref 6–20)
CO2: 23 mmol/L (ref 22–32)
Calcium: 9 mg/dL (ref 8.9–10.3)
Chloride: 105 mmol/L (ref 98–111)
Creatinine, Ser: 0.83 mg/dL (ref 0.61–1.24)
GFR, Estimated: >60 mL/min (ref >60)
Glucose, Bld: 241 mg/dL — ABNORMAL HIGH (ref 70–99)
Potassium: 3.7 mmol/L (ref 3.5–5.1)
Sodium: 137 mmol/L (ref 135–145)

## 2021-10-17 LAB — FERRITIN: Ferritin: 336 ng/mL (ref 24–336)

## 2021-10-17 LAB — PHOSPHORUS: Phosphorus: 2.7 mg/dL (ref 2.5–4.6)

## 2021-10-17 LAB — GLUCOSE, CAPILLARY
Glucose-Capillary: 245 mg/dL — ABNORMAL HIGH (ref 70–99)
Glucose-Capillary: 235 mg/dL — ABNORMAL HIGH (ref 70–99)
Glucose-Capillary: 237 mg/dL — ABNORMAL HIGH (ref 70–99)

## 2021-10-17 LAB — C-REACTIVE PROTEIN: CRP: 2.6 mg/dL — ABNORMAL HIGH (ref <1.0)

## 2021-10-17 LAB — D-DIMER, QUANTITATIVE: D-Dimer, Quant: 0.61 ug/mL-FEU — ABNORMAL HIGH (ref 0.00–0.50)

## 2021-10-17 LAB — HIV ANTIBODY (ROUTINE TESTING W REFLEX): HIV Screen 4th Generation wRfx: NONREACTIVE

## 2021-10-17 LAB — MAGNESIUM: Magnesium: 1.9 mg/dL (ref 1.7–2.4)

## 2021-10-17 MED ORDER — BARICITINIB 4 MG PO TABS
4.0000 mg | ORAL_TABLET | Freq: Every day | ORAL | 0 refills | Status: AC
  Filled 2021-10-17: qty 3, 3d supply, fill #0

## 2021-10-17 MED ORDER — LIVING WELL WITH DIABETES BOOK
Freq: Once | Status: AC
  Filled 2021-10-17: qty 1

## 2021-10-17 NOTE — Discharge Instructions (Addendum)
Please check your sugars 3 x day prior to meals and again at bedtime. Take a low carbohydrate and low fat diet.   Please review all of you discharge paperwork on the day of discharge and be sure you have all of your prescribed medications.  Please request your Primary MD to go over all Hospital Tests and Procedure/Radiological results at the follow up Please get all Hospital records sent to your primary MD by signing hospital release before you go home.   In some cases, there will be blood work, cultures and biopsy results pending at the time of your discharge. Please request that your primary care M.D. goes through all the records of your hospital data and follows up on these results.  Please take all your medications with you for your next visit with your Primary MD   Please request your Primary MD to go over all hospital tests and procedure/radiological results at the follow up, please ask your Primary MD to get all Hospital records sent to his/her office.   You must read complete instructions/literature along with all the possible adverse reactions/side effects for all the Medicines you take and that have been prescribed to you. Take any new Medicines after you have completely understood and accpet all the possible adverse reactions/side effects.    Do not drive or operate heavy machinery when taking Pain medications.    Do not take more than prescribed Pain, Sleep and Anxiety Medications  If you have smoked or chewed Tobacco  in the last 2 yrs please stop smoking, stop any regular Alcohol  and or any Recreational drug use.   Wear Seat belts while driving.   If you had Pneumonia or Lung problems at the Hospital: Please get a 2 view Chest X ray done in 6-8 weeks after hospital discharge or sooner if instructed by your Primary MD.   If you have Congestive Heart Failure: Please call your Cardiologist or Primary MD anytime you have any of the following symptoms:  1) 3 pound weight gain in  24 hours or 5 pounds in 1 week  2) shortness of breath, with or without a dry hacking cough  3) swelling in the hands, feet or stomach  4) if you have to sleep on extra pillows at night in order to breathe 5) Follow cardiac low salt diet and 1.5 lit/day fluid restriction.   If you have Diabetes Accuchecks 4 times/day- once on AM empty stomach and then before each meal. Log in all results and show them to your primary doctor at your next visit. If any glucose reading is under 60 or above 400 call your primary MD immediately.   If you have Seizure/Convulsions/Epilepsy: Please do not drive, operate heavy machinery, participate in activities at heights or participate in high speed sports until you have seen by Primary MD or a Neurologist and advised to do so again. Per [facility] statutes, patients with seizures are not allowed to drive until they have been seizure-free for six months.  Use caution when using heavy equipment or power tools. Avoid working on ladders or at heights. Take showers instead of baths. Ensure the water temperature is not too high on the home water heater. Do not go swimming alone. Do not lock yourself in a room alone (i.e. bathroom). When caring for infants or small children, sit down when holding, feeding, or changing them to minimize risk of injury to the child in the event you have a seizure. Maintain good sleep hygiene.  Avoid alcohol.    If you had Gastrointestinal Bleeding: Please ask your Primary MD to check a complete blood count within one week of discharge or at your next visit. Your endoscopic/colonoscopic biopsies that are pending at the time of discharge, will also need to followed by your Primary MD.  Please note You were cared for by a hospitalist during your hospital stay. If you have any questions about your discharge medications or the care you received while you were in the hospital after you are discharged, you can call the unit and asked to speak  with the hospitalist on call if the hospitalist that took care of you is not available. Once you are discharged, your primary care physician will handle any further medical issues. Please note that NO REFILLS for any discharge medications will be authorized once you are discharged, as it is imperative that you return to your primary care physician (or establish a relationship with a primary care physician if you do not have one) for your aftercare needs so that they can reassess your need for medications and monitor your lab values.   You can reach the hospitalist office at phone [phone] or fax [phone]   If you do not have a primary care physician, you can call [phone] for a physician referral.      Plate Method for Diabetes   Foods with carbohydrates make your blood glucose level go up. The plate method is a simple way to meal plan and control the amount of carbohydrate you eat.         Use the following guidance to build a healthy plate to control carbohydrates. Divide a 9-inch plate into 3 sections, and consider your beverage the 4th section of your meal: Food Group Examples of Foods/Beverages for This Section of your Meal  Section 1: Non-starchy vegetables Fill  of your plate to include non-starchy vegetables Asparagus, broccoli, brussels sprouts, cabbage, carrots, cauliflower, celery, cucumber, green beans, mushrooms, peppers, salad greens, tomatoes, or zucchini.  Section 2: Protein foods Fill  of your plate to include a lean protein Lean meat, poultry, fish, seafood, cheese, eggs, lean deli meat, tofu, beans, lentils, nuts or nut butters.  Section 3: Carbohydrate foods Fill  of your plate to include carbohydrate foods Whole grains, whole wheat bread, brown rice, whole grain pasta, polenta, corn tortillas, fruit, or starchy vegetables (potatoes, green peas, corn, beans, acorn squash, and butternut squash). One cup of milk also counts as a food that contains carbohydrate.   Section 4: Beverage Choose water or a low-calorie drink for your beverage. Unsweetened tea, coffee, or flavored/sparkling water without added sugar.  Image reprinted with permission from The American Diabetes Association.  Copyright 2022 by the American Diabetes Association.   Copyright 2022  Academy of [employer] and Dietetics. All rights reserved      Carbohydrate Counting For People With Diabetes  Foods with carbohydrates make your blood glucose level go up. Learning how to count carbohydrates can help you control your blood glucose levels. First, identify the foods you eat that contain carbohydrates. Then, using the Foods with Carbohydrates chart, determine about how much carbohydrates are in your meals and snacks. Make sure you are eating foods with fiber, protein, and healthy fat along with your carbohydrate foods.  Foods with Carbohydrates The following table shows carbohydrate foods that have about 15 grams of carbohydrate each. Using measuring cups, spoons, or a food scale when you first begin learning about carbohydrate counting can help you learn about the  portion sizes you typically eat. The following foods have 15 grams carbohydrate each:   Grains 1 slice bread (1 ounce)  1 small tortilla (6-inch size)   large bagel (1 ounce)  1/3 cup pasta or rice (cooked)   hamburger or hot dog bun ( ounce)   cup cooked cereal   to  cup ready-to-eat cereal  2 taco shells (5-inch size) Fruit 1 small fresh fruit ( to 1 cup)   medium banana  17 small grapes (3 ounces)  1 cup melon or berries   cup canned or frozen fruit  2 tablespoons dried fruit (blueberries, cherries, cranberries, raisins)   cup unsweetened fruit juice  Starchy Vegetables  cup cooked beans, peas, corn, potatoes/sweet potatoes   large baked potato (3 ounces)  1 cup acorn or butternut squash  Snack Foods 3 to 6 crackers  8 potato chips or 13 tortilla chips ( ounce to 1 ounce)  3 cups popped popcorn   Dairy 3/4 cup (6 ounces) nonfat plain yogurt, or yogurt with sugar-free sweetener  1 cup milk  1 cup plain rice, soy, coconut or flavored almond milk Sweets and Desserts  cup ice cream or frozen yogurt  1 tablespoon jam, jelly, pancake syrup, table sugar, or honey  2 tablespoons light pancake syrup  1 inch square of frosted cake or 2 inch square of unfrosted cake  2 small cookies (2/3 ounce each) or  large cookie  Sometimes you'll have to estimate carbohydrate amounts if you don't know the exact recipe. One cup of mixed foods like soups can have 1 to 2 carbohydrate servings, while some casseroles might have 2 or more servings of carbohydrate. Foods that have less than 20 calories in each serving can be counted as "free" foods. Count 1 cup raw vegetables, or  cup cooked non-starchy vegetables as "free" foods. If you eat 3 or more servings at one meal, then count them as 1 carbohydrate serving.   Foods without Carbohydrates  Not all foods contain carbohydrates. Meat, some dairy, fats, non-starchy vegetables, and many beverages don't contain carbohydrate. So when you count carbohydrates, you can generally exclude chicken, pork, beef, fish, seafood, eggs, tofu, cheese, butter, sour cream, avocado, nuts, seeds, olives, mayonnaise, water, black coffee, unsweetened tea, and zero-calorie drinks. Vegetables with no or low carbohydrate include green beans, cauliflower, tomatoes, and onions.  How much carbohydrate should I eat at each meal?  Carbohydrate counting can help you plan your meals and manage your weight. Following are some starting points for carbohydrate intake at each meal. Work with your registered dietitian nutritionist to find the best range that works for your blood glucose and weight.   To Lose Weight To Maintain Weight  Women 2 - 3 carb servings 3 - 4 carb servings  Men 3 - 4 carb servings 4 - 5 carb servings  Checking your blood glucose after meals will help you know if you need to  adjust the timing, type, or number of carbohydrate servings in your meal plan. Achieve and keep a healthy body weight by balancing your food intake and physical activity.  Tips How should I plan my meals?  Plan for half the food on your plate to include non-starchy vegetables, like salad greens, broccoli, or carrots. Try to eat 3 to 5 servings of non-starchy vegetables every day. Have a protein food at each meal. Protein foods include chicken, fish, meat, eggs, or beans (note that beans contain carbohydrate). These two food groups (non-starchy vegetables and proteins) are  low in carbohydrate. If you fill up your plate with these foods, you will eat less carbohydrate but still fill up your stomach. Try to limit your carbohydrate portion to  of the plate.   What fats are healthiest to eat?  Diabetes increases risk for heart disease. To help protect your heart, eat more healthy fats, such as olive oil, nuts, and avocado. Eat less saturated fats like butter, cream, and high-fat meats, like bacon and sausage. Avoid trans fats, which are in all foods that list "partially hydrogenated oil" as an ingredient.  What should I drink?  Choose drinks that are not sweetened with sugar. The healthiest choices are water, carbonated or seltzer waters, and tea and coffee without added sugars.  Sweet drinks will make your blood glucose go up very quickly. One serving of soda or energy drink is  cup. It is best to drink these beverages only if your blood glucose is low.  Artificially sweetened, or diet drinks, typically do not increase your blood glucose if they have zero calories in them. Read labels of beverages, as some diet drinks do have carbohydrate and will raise your blood glucose.  Label Reading Tips Read Nutrition Facts labels to find out how many grams of carbohydrate are in a food you want to eat. Don't forget: sometimes serving sizes on the label aren't the same as how much food you are going to eat, so you  may need to calculate how much carbohydrate is in the food you are serving yourself.   Carbohydrate Counting for People with Diabetes Sample 1-Day Menu  Breakfast  cup yogurt, low fat, low sugar (1 carbohydrate serving)   cup cereal, ready-to-eat, unsweetened (1 carbohydrate serving)  1 cup strawberries (1 carbohydrate serving)   cup almonds ( carbohydrate serving)  Lunch 1, 5 ounce can chunk light tuna  2 ounces cheese, low fat cheddar  6 whole wheat crackers (1 carbohydrate serving)  1 small apple (1 carbohydrate servings)   cup carrots ( carbohydrate serving)   cup snap peas  1 cup 1% milk (1 carbohydrate serving)   Evening Meal Stir fry made with: 3 ounces chicken  1 cup brown rice (3 carbohydrate servings)   cup broccoli ( carbohydrate serving)   cup green beans   cup onions  1 tablespoon olive oil  2 tablespoons teriyaki sauce ( carbohydrate serving)  Evening Snack 1 extra small banana (1 carbohydrate serving)  1 tablespoon peanut butter   Carbohydrate Counting for People with Diabetes Vegan Sample 1-Day Menu  Breakfast 1 cup cooked oatmeal (2 carbohydrate servings)   cup blueberries (1 carbohydrate serving)  2 tablespoons flaxseeds  1 cup soymilk fortified with calcium and vitamin D  1 cup coffee  Lunch 2 slices whole wheat bread (2 carbohydrate servings)   cup baked tofu   cup lettuce  2 slices tomato  2 slices avocado   cup baby carrots ( carbohydrate serving)  1 orange (1 carbohydrate serving)  1 cup soymilk fortified with calcium and vitamin D   Evening Meal Burrito made with: 1 6-inch corn tortilla (1 carbohydrate serving)  1 cup refried vegetarian beans (2 carbohydrate servings)   cup chopped tomatoes   cup lettuce   cup salsa  1/3 cup brown rice (1 carbohydrate serving)  1 tablespoon olive oil for rice   cup zucchini   Evening Snack 6 small whole grain crackers (1 carbohydrate serving)  2 apricots ( carbohydrate serving)   cup  unsalted peanuts ( carbohydrate serving)  Carbohydrate Counting for People with Diabetes Vegetarian (Lacto-Ovo) Sample 1-Day Menu  Breakfast 1 cup cooked oatmeal (2 carbohydrate servings)   cup blueberries (1 carbohydrate serving)  2 tablespoons flaxseeds  1 egg  1 cup 1% milk (1 carbohydrate serving)  1 cup coffee  Lunch 2 slices whole wheat bread (2 carbohydrate servings)  2 ounces low-fat cheese   cup lettuce  2 slices tomato  2 slices avocado   cup baby carrots ( carbohydrate serving)  1 orange (1 carbohydrate serving)  1 cup unsweetened tea  Evening Meal Burrito made with: 1 6-inch corn tortilla (1 carbohydrate serving)   cup refried vegetarian beans (1 carbohydrate serving)   cup tomatoes   cup lettuce   cup salsa  1/3 cup brown rice (1 carbohydrate serving)  1 tablespoon olive oil for rice   cup zucchini  1 cup 1% milk (1 carbohydrate serving)  Evening Snack 6 small whole grain crackers (1 carbohydrate serving)  2 apricots ( carbohydrate serving)   cup unsalted peanuts ( carbohydrate serving)    Copyright 2020  Academy of Nutrition and Dietetics. All rights reserved.  Using Nutrition Labels: Carbohydrate  Serving Size  Look at the serving size. All the information on the label is based on this portion. Servings Per Container  The number of servings contained in the package. Guidelines for Carbohydrate  Look at the total grams of carbohydrate in the serving size.  1 carbohydrate choice = 15 grams of carbohydrate. Range of Carbohydrate Grams Per Choice  Carbohydrate Grams/Choice Carbohydrate Choices  6-10   11-20 1  21-25 1  26-35 2  36-40 2  41-50 3  51-55 3  56-65 4  66-70 4  71-80 5    Copyright 2020  Academy of Nutrition and Dietetics. All rights reserved.

## 2021-10-17 NOTE — Plan of Care (Signed)
  Problem: Clinical Measurements: Goal: Respiratory complications will improve Outcome: Completed/Met   Problem: Nutrition: Goal: Adequate nutrition will be maintained Outcome: Completed/Met   Problem: Coping: Goal: Level of anxiety will decrease Outcome: Completed/Met   Problem: Elimination: Goal: Will not experience complications related to urinary retention Outcome: Completed/Met   Problem: Pain Managment: Goal: General experience of comfort will improve Outcome: Completed/Met   Problem: Nutritional: Goal: Maintenance of adequate nutrition will improve Outcome: Completed/Met   Problem: Skin Integrity: Goal: Risk for impaired skin integrity will decrease Outcome: Completed/Met

## 2021-10-17 NOTE — TOC Transition Note (Signed)
Transition of Care [facility]) - CM/SW Discharge Note   Patient Details  Name: Devin [name] MRN: [id number] Date of Birth: 01/08/1971  [facility] CM/SW Contact:  Leone Haven, RN Phone Number: 10/17/2021, 1:29 PM   Clinical Narrative:    Patient is for dc today, his sister will transport him home, he has no needs.         Patient Goals and CMS Choice        Discharge Placement                       Discharge Plan and Services                                     Social Determinants of Health (SDOH) Interventions     Readmission Risk Interventions     No data to display

## 2021-10-17 NOTE — Discharge Summary (Signed)
Physician Discharge Summary  Devin [name]:[id number] DOB: [birth year] DOA: 10/15/2021  PCP: Donetta Potts, MD  Admit date: 10/15/2021 Discharge date: 10/17/2021 Discharging to: home Recommendations for Outpatient Follow-up:  Need aggressive control of diabetes  Consults:  none Procedures:  none   Discharge Diagnoses:   Principal Problem:   Severe sepsis (HCC) Active Problems:   Hyperosmolar hyperglycemic state (HHS) (HCC)   COVID-19 virus infection   Acute metabolic encephalopathy   AKI (acute kidney injury) (HCC)   Chronic combined systolic and diastolic CHF (congestive heart failure) (HCC)   DM (diabetes mellitus), type 2, uncontrolled, with hyperosmolarity [facility])     Hospital Course:  This is a 51 year old male with CAD with stents, ischemic cardiomyopathy with systolic and diastolic heart failure with improved EF, type 2 diabetes mellitus, obesity, OSA not compliant with CPAP, hypertension, hyperlipidemia, hypothyroidism. When the patient was driving with his friend, he began to shake and was noncommunicative.  When EMS arrived, the patient was lethargic and unable to answer questions.  He awaken to noxious stimuli. In the ED heart rate was in the 150s, pulse ox was 95% on room air.  Temperature rose to 102. Glucose was 692, creatinine 1.51 (baseline 1.0), sodium 124. WBC 12.3. Started on vancomycin and cefepime.   Principal Problem:   COVID-19 virus infection with severe sepsis and acute metabolic encephalopathy -COVID-positive - RR in 20s- he ha a cough but his cough but he states he has a chronic cough - CTA negative for infiltrates - check CRP 4.4 > 2.6 -  D dimer neg and then 0.61 - Ferritin 336 - started Baricitanib as he is high risk for decompensation- avoid steroids as sugars very high -  blood cultures negative- dc Vanc and Zosyn - MRI brain negative - UDS positive for opiates and Benzo which he is receiving in the hospital  - confusion resolved by  day 2 - HIV neg  Active Problems:    DM (diabetes mellitus), type 2, uncontrolled, with hyperosmolarity  Hyperosmolar hyperglycemic state    - A1c 14.7 - suspect high sugars are related to COVID  - glucose 301 - he states he takes his medications as ordered but does not adhere to a strict carb modified diet - cont insulin- he takes 75/25 at home 88U BID - consulted diabetes coordinator and nutritionist for teaching     Acute metabolic encephalopathy CT without contrast, MRI and CTA head and neck unremarkable - resolved   COPD - no wheezing noted     AKI (acute kidney injury), hyponatremia, dehydration - CR 1.40 - improved to 1.00 today - will cont NS infusion @ 75 cc/hr     Chronic combined systolic and diastolic CHF (congestive heart failure)  - uses Lasix PRN at home-  - see above   CAD - cont ASA, Plavix, Zetia, Vascepa   Hypothyroid - cont Euthrox and Levothroid             Discharge Instructions  Discharge Instructions     Diet - low sodium heart healthy   Complete by: As directed    Diet Carb Modified   Complete by: As directed    Increase activity slowly   Complete by: As directed       Allergies as of 10/17/2021       Reactions   Crestor [rosuvastatin] Other (See Comments)   rhabdomyolosis   Trulicity [dulaglutide] Nausea And Vomiting   Bee Venom Swelling   SWELLING REACTION UNSPECIFIED  Medication List     TAKE these medications    Accu-Chek Guide test strip Generic drug: glucose blood Test glucose 2 times a day   aspirin EC 81 MG tablet Take 1 tablet (81 mg total) by mouth daily. With food   B-D ULTRAFINE III SHORT PEN 31G X 8 MM Misc Generic drug: Insulin Pen Needle Use as directed to inject insulin 2 times daily.   Baricitinib 4 MG Tabs Take 4 mg by mouth daily for 3 days. Start taking on: October 18, 2021   clopidogrel 75 MG tablet Commonly known as: PLAVIX Take 1 tablet (75 mg total) by mouth daily.   docusate  sodium 100 MG capsule Commonly known as: COLACE Take 100 mg by mouth daily.   Dulera 200-5 MCG/ACT Aero Generic drug: mometasone-formoterol Inhale 2 puffs into the lungs 2 (two) times daily.   Euthyrox 200 MCG tablet Generic drug: levothyroxine Take 200 mcg by mouth at bedtime.   levothyroxine 88 MCG tablet Commonly known as: SYNTHROID Take 88 mcg by mouth daily.   ezetimibe 10 MG tablet Commonly known as: ZETIA Take 1 tablet (10 mg total) by mouth daily.   famotidine 40 MG tablet Commonly known as: PEPCID Take 40 mg by mouth daily.   fenofibrate 145 MG tablet Commonly known as: TRICOR Take 1 tablet (145 mg total) by mouth daily.   fluticasone 50 MCG/ACT nasal spray Commonly known as: FLONASE Place 2 sprays into both nostrils 2 (two) times daily.   FreeStyle Libre 2 Reader Hardie Pulley USE AS DIRECTED   FreeStyle Libre 2 Sensor Misc APPLY 1 PATCH TO SKIN EVERY 14 DAYS   furosemide 20 MG tablet Commonly known as: LASIX TAKE 1 TABLET BY MOUTH AS NEEDED FOR  SWELLING What changed: See the new instructions.   gabapentin 300 MG capsule Commonly known as: NEURONTIN One po q AM, one po q PM and 2 po qHS What changed:  how much to take how to take this when to take this additional instructions   glipiZIDE 10 MG 24 hr tablet Commonly known as: GLUCOTROL XL Take 1 tablet (10 mg total) by mouth daily.   Insulin Lispro Prot & Lispro (75-25) 100 UNIT/ML Kwikpen Commonly known as: HumaLOG Mix 75/25 KwikPen Inject 100 Units into the skin 2 (two) times daily with a meal.   methocarbamol 500 MG tablet Commonly known as: ROBAXIN Take 1 tablet (500 mg total) by mouth every 8 (eight) hours as needed for muscle spasms.   nitroGLYCERIN 0.4 MG SL tablet Commonly known as: NITROSTAT Place 1 tablet (0.4 mg total) under the tongue every 5 (five) minutes as needed for chest pain.   ondansetron 4 MG disintegrating tablet Commonly known as: ZOFRAN-ODT Take 1 tablet (4 mg total) by  mouth every 8 (eight) hours as needed for nausea or vomiting.   pantoprazole 40 MG tablet Commonly known as: PROTONIX TAKE 1 TABLET BY MOUTH ONCE DAILY - NEED TO SCHEDULE AN APPOINTMENT What changed: See the new instructions.   Repatha SureClick 140 MG/ML Soaj Generic drug: Evolocumab Inject 1 pen into the skin every 14 (fourteen) days.   sitaGLIPtin 50 MG tablet Commonly known as: JANUVIA Take 1 tablet (50 mg total) by mouth daily.   Vascepa 1 g capsule Generic drug: icosapent Ethyl Take 2 capsules (2 g total) by mouth 2 (two) times daily.   Ventolin HFA 108 (90 Base) MCG/ACT inhaler Generic drug: albuterol Inhale into the lungs.        Follow-up Information  Donetta Potts, MD. Go on 10/31/2021.   Specialty: Internal Medicine Why: @11 :45am please arrive 15 minutes early and wear a mask.Thanks Contact information: [address] [place] [phone]                    The results of significant diagnostics from this hospitalization (including imaging, microbiology, ancillary and laboratory) are listed below for reference.    MR BRAIN WO CONTRAST  Result Date: 10/16/2021 CLINICAL DATA:  Encephalopathy EXAM: MRI HEAD WITHOUT CONTRAST TECHNIQUE: Multiplanar, multiecho pulse sequences of the brain and surrounding structures were obtained without intravenous contrast. COMPARISON:  11/18/2019 FINDINGS: Brain: No acute infarct, mass effect or extra-axial collection. No acute or chronic hemorrhage. Normal white matter signal, parenchymal volume and CSF spaces. The midline structures are normal. There is an old left cerebellar infarct. Vascular: Major flow voids are preserved. Skull and upper cervical spine: Normal calvarium and skull base. Visualized upper cervical spine and soft tissues are normal. Sinuses/Orbits:No paranasal sinus fluid levels or advanced mucosal thickening. No mastoid or middle ear effusion. Normal orbits. IMPRESSION: 1. No acute  intracranial abnormality. 2. Old left cerebellar infarct. Electronically Signed   By: 01/18/2020 M.D.   On: 10/16/2021 02:47   CT ANGIO HEAD NECK W WO CM  Result Date: 10/15/2021 CLINICAL DATA:  Neuro deficit, acute, stroke suspected. Altered mental status. EXAM: CT ANGIOGRAPHY HEAD AND NECK TECHNIQUE: Multidetector CT imaging of the head and neck was performed using the standard protocol during bolus administration of intravenous contrast. Multiplanar CT image reconstructions and MIPs were obtained to evaluate the vascular anatomy. Carotid stenosis measurements (when applicable) are obtained utilizing NASCET criteria, using the distal internal carotid diameter as the denominator. RADIATION DOSE REDUCTION: This exam was performed according to the departmental dose-optimization program which includes automated exposure control, adjustment of the mA and/or kV according to patient size and/or use of iterative reconstruction technique. CONTRAST:  12/15/2021 OMNIPAQUE IOHEXOL 350 MG/ML SOLN COMPARISON:  Head and neck CTA 06/21/2021 FINDINGS: CTA NECK FINDINGS Aortic arch: Standard 3 vessel aortic arch with widely patent arch vessel origins. Luminal irregularity in the proximal left subclavian artery due to soft plaque without significant stenosis. Right carotid system: Patent with a small amount of calcified and soft plaque at the carotid bifurcation. No evidence of a significant stenosis or dissection. Left carotid system: Patent with mixed calcified and soft plaque at the carotid bifurcation resulting in 65% stenosis of the ICA origin, not significantly changed from the prior CTA upon remeasurement. Vertebral arteries: Patent and codominant without evidence of a significant stenosis or dissection within limitations of shoulder artifact which results in suboptimal assessment of the V1 segments. Skeleton: No acute osseous abnormality or suspicious osseous lesion. Other neck: Asymmetric fatty atrophy of the right parotid  gland. No evidence of cervical lymphadenopathy or mass. Upper chest: Mild paraseptal emphysema in the lung apices. Review of the MIP images confirms the above findings CTA HEAD FINDINGS Anterior circulation: The internal carotid arteries are widely patent from skull base to carotid termini. ACAs and MCAs are patent without evidence of a proximal branch occlusion or significant proximal stenosis. No aneurysm is identified. Posterior circulation: The intracranial right vertebral artery is patent with unchanged moderate stenosis distally. The intracranial left vertebral artery is patent with a stent again noted extending from the left V4 segment to the mid to distal basilar artery. The stent is patent, however there is a similar appearance of a suspected severe stenosis within the  proximal aspect of the stent compared to the prior CTA. The PCAs are patent with mild proximal P1 stenoses bilaterally. No aneurysm is identified. Venous sinuses: Patent. Anatomic variants: None. Review of the MIP images confirms the above findings IMPRESSION: 1. No large vessel occlusion. 2. 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 3. Patent stent extending from the left V4 segment into the basilar artery with an unchanged severe stenosis in the proximal aspect of the stent. 4. Unchanged moderate distal right V4 stenosis. 5. Aortic Atherosclerosis (ICD10-I70.0) and Emphysema (ICD10-J43.9). Electronically Signed   By: Logan Bores M.D.   On: 10/15/2021 18:14   CT Head Wo Contrast  Result Date: 10/15/2021 CLINICAL DATA:  Altered mental status. EXAM: CT HEAD WITHOUT CONTRAST TECHNIQUE: Contiguous axial images were obtained from the base of the skull through the vertex without intravenous contrast. RADIATION DOSE REDUCTION: This exam was performed according to the departmental dose-optimization program which includes automated exposure control, adjustment of the mA and/or kV according to patient size and/or use of iterative reconstruction  technique. COMPARISON:  Head and neck CTA 06/21/2021.  Head MRI 11/18/2019. FINDINGS: Brain: There is no evidence of an acute infarct, intracranial hemorrhage, mass, midline shift, or extra-axial fluid collection. 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 The ventricles are normal in size. Vascular: Vertebrobasilar stent. Skull: No fracture or suspicious osseous lesion. 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 Clear mastoid air cells. Unremarkable orbits. Other: None. IMPRESSION: 1. No evidence of acute intracranial abnormality. 2. Chronic left cerebellar infarct. Electronically Signed   By: Logan Bores M.D.   On: 10/15/2021 17:57   CT Angio Chest/Abd/Pel for Dissection W and/or Wo Contrast  Result Date: 10/15/2021 CLINICAL DATA:  Chest pain and back pain. Aortic dissection suspected. EXAM: CT ANGIOGRAPHY CHEST, ABDOMEN AND PELVIS TECHNIQUE: Non-contrast CT of the chest was initially obtained. Multidetector CT imaging through the chest, abdomen and pelvis was performed using the standard protocol during bolus administration of intravenous contrast. Multiplanar reconstructed images and MIPs were obtained and reviewed to evaluate the vascular anatomy. RADIATION DOSE REDUCTION: This exam was performed according to the departmental dose-optimization program which includes automated exposure control, adjustment of the mA and/or kV according to patient size and/or use of iterative reconstruction technique. CONTRAST:  138mL OMNIPAQUE IOHEXOL 350 MG/ML SOLN COMPARISON:  Abdominal CT June 21, 2021 FINDINGS: CTA CHEST FINDINGS Cardiovascular: Preferential opacification of the thoracic aorta. No evidence of thoracic aortic aneurysm or dissection. Normal heart size. No pericardial effusion. Mediastinum/Nodes: No enlarged mediastinal, hilar, or axillary lymph nodes. Thyroid gland, trachea, and esophagus demonstrate no significant findings. Lungs/Pleura: Mild upper lobe predominant  emphysema. Lower lobe predominant atelectasis versus interstitial lung changes. Musculoskeletal: No chest wall abnormality. No acute or significant osseous findings. Review of the MIP images confirms the above findings. CTA ABDOMEN AND PELVIS FINDINGS VASCULAR Aorta: Normal caliber aorta without aneurysm, dissection, vasculitis or significant stenosis. Celiac: Patent without evidence of aneurysm, dissection, vasculitis or significant stenosis. SMA: Patent without evidence of aneurysm, dissection, vasculitis or significant stenosis. Renals: Both renal arteries are patent without evidence of aneurysm, dissection, vasculitis, fibromuscular dysplasia or significant stenosis. IMA: Patent without evidence of aneurysm, dissection, vasculitis or significant stenosis. Inflow: Patent without evidence of aneurysm, dissection, vasculitis or significant stenosis. Veins: No obvious venous abnormality within the limitations of this arterial phase study. Review of the MIP images confirms the above findings. NON-VASCULAR Hepatobiliary: Hepatic steatosis. Post cholecystectomy. No evidence of biliary ductal dilation. Pancreas: Unremarkable. No pancreatic ductal dilatation or surrounding inflammatory  changes. Spleen: Normal in size without focal abnormality. Adrenals/Urinary Tract: Normal adrenal glands. No evidence of hydronephrosis or nephrolithiasis. The previously seen right nephrolithiasis may be obscured by excreted contrast within the collecting systems of the kidneys. 1.1 cm intermediate density hypoattenuated mass in the lower pole of the left kidney, attenuation of 25.8 Hounsfield units. Normal urinary bladder. Stomach/Bowel: Stomach is within normal limits. Appendix appears normal. No evidence of bowel wall thickening, distention, or inflammatory changes. Lymphatic: No evidence upper the. Mild atherosclerosis of the aorta and bilateral iliac arteries with calcified and noncalcified plaque. No flow limiting stenosis.  Reproductive: Prostate is unremarkable. Other: No abdominal wall hernia or abnormality. No abdominopelvic ascites. Musculoskeletal: No acute or significant osseous findings. Review of the MIP images confirms the above findings. IMPRESSION: 1. No evidence of thoracic or abdominal aortic aneurysm or dissection. 2. Mild atherosclerosis of the aorta and bilateral iliac arteries, without flow limiting stenosis. 3. Hepatic steatosis. 4. 1.1 cm intermediate density hypoattenuated mass in the lower pole of the left kidney. This may represent a hemorrhagic or proteinaceous cyst, however further evaluation with nonemergent renal ultrasound may be considered. Electronically Signed   By: Fidela Salisbury M.D.   On: 10/15/2021 17:55   DG Chest Portable 1 View  Result Date: 10/15/2021 CLINICAL DATA:  Sepsis, diabetes EXAM: PORTABLE CHEST 1 VIEW COMPARISON:  03/26/2021 FINDINGS: Single frontal view of the chest demonstrates unremarkable cardiac silhouette. No acute airspace disease, effusion, or pneumothorax. No acute bony abnormality. IMPRESSION: 1. No acute intrathoracic process. Electronically Signed   By: Randa Ngo M.D.   On: 10/15/2021 17:23   Labs:   Basic Metabolic Panel: Recent Labs  Lab 10/15/21 1656 10/15/21 1703 10/15/21 2318 10/16/21 0417 10/17/21 0648  NA 124* 127* 131* 135 137  K 4.6 4.9 3.9 4.0 3.7  CL 90* 94* 99 107 105  CO2 22  --  23 17* 23  GLUCOSE 692* >700* 301* 334* 241*  BUN 16 16 11 11 10   CREATININE 1.51* 1.40* 1.00 0.96 0.83  CALCIUM 9.8  --  8.8* 8.3* 9.0  MG  --   --   --   --  1.9  PHOS  --   --  3.2  --  2.7     CBC: Recent Labs  Lab 10/15/21 1656 10/15/21 1703 10/16/21 0417 10/17/21 0648  WBC 12.6*  --  9.5 7.0  NEUTROABS 9.0*  --   --   --   HGB 15.9 17.3* 13.2 13.5  HCT 46.6 51.0 38.3* 39.8  MCV 83.4  --  84.5 83.8  PLT 298  --  216 221         SIGNED:   Debbe Odea, MD  [facility] 10/17/2021, 2:51 PM

## 2021-10-17 NOTE — Inpatient Diabetes Management (Addendum)
Inpatient Diabetes Program Recommendations  AACE/ADA: New Consensus Statement on Inpatient Glycemic Control (2015)  Target Ranges:  Prepandial:   less than 140 mg/dL      Peak postprandial:   less than 180 mg/dL (1-2 hours)      Critically ill patients:  140 - 180 mg/dL   Lab Results  Component Value Date   GLUCAP 235 (H) 10/17/2021   HGBA1C 14.7 (H) 10/15/2021    Review of Glycemic Control  Latest Reference Range & Units 10/16/21 08:46 10/16/21 11:59 10/16/21 16:52 10/16/21 22:08 10/17/21 05:48 10/17/21 08:16  Glucose-Capillary 70 - 99 mg/dL 409 (H) 811 (H) 914 (H) 345 (H) 245 (H) 235 (H)  (H): Data is abnormally high  Diabetes history: DM2 Outpatient Diabetes medications:  75/25 100 units BID Januvia 100 mg QD Glipizide 10 mg QD Freestyle Libre CGM Current orders for Inpatient glycemic control:  Levemir 40 units BID Novolog 0-20 TID & 0-5 units QHS   Inpatient Diabetes Program Recommendations:    Please consider:  Levemir 45 units BID  Ordered Living Well with Diabetes booklet.  Will see patient regarding A1C and DM management.    Addendum@13 :67.  Spoke with patient at bedside.  Confirms above home medications.  He is sees with Dr. Fransico Him for endocrinology.  Has missed a couple of appointments due to family deaths.  He states Dr. Fransico Him placed him on a once a week injection and it made him sick so he stopped.  Patient has Medicaid.  He states his blood sugars stay above 250 mg/dL.  He checks his blood sugar 2 x a day.    He does not have an appointment set up with Dr. Fransico Him.  Encouraged him to make a f/u appt.  Asked him to speak with Dr. Fransico Him about switching to basal bolus-this will probably provide better glucose control.   Educated on basic patho of DM2, The Plate Method, CHO's, portion control, CBGs at home fasting and mid afternoon, F/U with PCP every 3 months, bring meter to PCP office, long and short term complications of uncontrolled BG, and importance of  exercise.  He does not follow a healthy diet.  States he drinks water only.  LWWD booklet at [facility].    Will continue to follow while inpatient.  Thank you, Dulce Sellar, MSN, CDCES Diabetes Coordinator Inpatient Diabetes Program [phone] (team pager from 8a-5p)

## 2021-10-19 ENCOUNTER — Other Ambulatory Visit: Payer: Self-pay | Admitting: Cardiology

## 2021-10-20 LAB — CULTURE, BLOOD (ROUTINE X 2)
Culture: NO GROWTH
Culture: NO GROWTH
Special Requests: ADEQUATE
Special Requests: ADEQUATE

## 2021-10-31 DIAGNOSIS — E785 Hyperlipidemia, unspecified: Secondary | ICD-10-CM | POA: Diagnosis not present

## 2021-10-31 DIAGNOSIS — E1140 Type 2 diabetes mellitus with diabetic neuropathy, unspecified: Secondary | ICD-10-CM | POA: Diagnosis not present

## 2021-10-31 DIAGNOSIS — R109 Unspecified abdominal pain: Secondary | ICD-10-CM | POA: Diagnosis not present

## 2021-10-31 DIAGNOSIS — E039 Hypothyroidism, unspecified: Secondary | ICD-10-CM | POA: Diagnosis not present

## 2021-10-31 DIAGNOSIS — I10 Essential (primary) hypertension: Secondary | ICD-10-CM | POA: Diagnosis not present

## 2021-10-31 DIAGNOSIS — K219 Gastro-esophageal reflux disease without esophagitis: Secondary | ICD-10-CM | POA: Diagnosis not present

## 2021-10-31 DIAGNOSIS — I2510 Atherosclerotic heart disease of native coronary artery without angina pectoris: Secondary | ICD-10-CM | POA: Diagnosis not present

## 2021-10-31 DIAGNOSIS — E1159 Type 2 diabetes mellitus with other circulatory complications: Secondary | ICD-10-CM | POA: Diagnosis not present

## 2021-10-31 DIAGNOSIS — F17200 Nicotine dependence, unspecified, uncomplicated: Secondary | ICD-10-CM | POA: Diagnosis not present

## 2021-10-31 DIAGNOSIS — J449 Chronic obstructive pulmonary disease, unspecified: Secondary | ICD-10-CM | POA: Diagnosis not present

## 2021-12-06 ENCOUNTER — Encounter: Payer: Self-pay | Admitting: *Deleted

## 2021-12-06 ENCOUNTER — Encounter: Payer: Self-pay | Admitting: Cardiology

## 2021-12-06 ENCOUNTER — Ambulatory Visit: Payer: Medicare Other | Attending: Cardiology | Admitting: Cardiology

## 2021-12-06 VITALS — BP 114/80 | HR 83 | Ht 78.0 in | Wt 257.4 lb

## 2021-12-06 DIAGNOSIS — I255 Ischemic cardiomyopathy: Secondary | ICD-10-CM

## 2021-12-06 DIAGNOSIS — E782 Mixed hyperlipidemia: Secondary | ICD-10-CM | POA: Diagnosis not present

## 2021-12-06 DIAGNOSIS — I2510 Atherosclerotic heart disease of native coronary artery without angina pectoris: Secondary | ICD-10-CM | POA: Diagnosis not present

## 2021-12-06 NOTE — Patient Instructions (Addendum)

## 2021-12-06 NOTE — Progress Notes (Signed)
Clinical Summary [name] is a 51 y.o.male seen today for follow up of the following medical problems.        1. CAD/Chest pain/Chronic systolic HF - admitted [id number] with chest pain concerning for unstable angina.  11/2017 coronary CTA with severe prox LAD disease and possible mid LAD disease and D1.   11/2017 cath LM patent, LAD mid 80 to 90%, LCX distal 90%, OM2 90%, LPDA 90%, RCA small with prox 99% LVgram 35-45% (no echo on file). DES x 2 to LAD, DES to OM,  trifurcation lesion distal LCX, LPL and PDA recs for medical therapy, poor pci target. RCA small vessel treated medically.    Interventional cards recommended extended DAPT    01/2018 echo: LVEF 35-40%, grade I diastoluc dysfunction  - bradycardia on beta blocketers. History of rhabdo on crestor. Low bp and dizzienss on entresto.       10/2018 nuclear stress: no clear ischemia. LVEF 46%  10/2019 echo  LVEF 50-55%, no WMAs, indet diastolic fxn, nomral RV     Jan 2023 ER visit with right sided chest pain - trop neg x 2. EKG without ischemic changes. CXR no acute proces  -no chest pains, chronic SOB unchanged. Variable SOB, some chronic cough.  - no recent edema      2. COPD - abnormal PFTs 02/2019 - on dulera, followed by pcp      3. Hyperlipidemia - history of rhabdo on crestor - previously extraordinarily high TGs in setting of poorly controlled DM2, hypothyroidism  -followed in lipid clinic. On repatha, zetia, fenofirbate, vascepa 12/2019 TC 235 TG 321 HDL 25 LDL 150. Direct LDL 143    06/2021 TC 232 TG 730 HDL 26 LDL 86 10/15/21 HgbA1c 14.7   4. History of CVA - history of CVA 11/2017 - had underwent an image-guided diagnostic cerebral angiogram 12/01/2017 with Dr. 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 He then underwent an image-guided cerebral angiogram with revascularization of his vertebrobasilar junction/proximal basilar artery stenosis  using stent assisted angioplasty 12/03/2017 by Dr. Estanislado [name] - later had ISR requiring repeat procedure 02/2018     4. Devin [name] admission 10/2021      SH: has 2 boxer dogs at home, 50 and 3. Oldest dog passed recently, now just has one boxer Past Medical History:  Diagnosis Date   Asthma    Chest pain    CHF (congestive heart failure) ([place])    Coronary artery disease    a. s/p DES x2 to LAD and DES to OM in 11/2017   Depression    Dyspnea    GERD (gastroesophageal reflux disease)    Headache    History of kidney stones    History of rhabdomyolysis    Hyperlipidemia    Hypertension    Hypothyroidism    Ischemic cardiomyopathy    a. 11/2017: echo showing EF of 35-40%, diffuse HK, and Grade 2 DD   Kidney calculus 2014   Pericardial effusion    Small, by dobutamine echocardiogram, 04/2006   PONV (postoperative nausea and vomiting)    Sleep apnea    does not wear cpap   Stroke ([place])    Tobacco abuse    Type 2 diabetes mellitus without complications ([place]) [id number]   Vitamin D deficiency      Allergies  Allergen Reactions   Crestor [Rosuvastatin] Other (See Comments)    rhabdomyolosis   Trulicity [Dulaglutide] Nausea And Vomiting   Bee Venom  Swelling    SWELLING REACTION UNSPECIFIED      Current Outpatient Medications  Medication Sig Dispense Refill   aspirin 81 MG EC tablet Take 1 tablet (81 mg total) by mouth daily. With food 30 tablet 0   clopidogrel (PLAVIX) 75 MG tablet Take 1 tablet (75 mg total) by mouth daily. 30 tablet 1   Continuous Blood Gluc Receiver (FREESTYLE LIBRE 2 READER) DEVI USE AS DIRECTED 1 each 0   Continuous Blood Gluc Sensor (FREESTYLE LIBRE 2 SENSOR) MISC APPLY 1 PATCH TO SKIN EVERY 14 DAYS 2 each 2   docusate sodium (COLACE) 100 MG capsule Take 100 mg by mouth daily.     DULERA 200-5 MCG/ACT AERO Inhale 2 puffs into the lungs 2 (two) times daily. 8.8 g 1   EUTHYROX 200 MCG tablet Take 200 mcg by mouth at bedtime.     Evolocumab (REPATHA  SURECLICK) [id number] MG/ML SOAJ Inject 1 pen into the skin every 14 (fourteen) days. 1 mL 11   ezetimibe (ZETIA) 10 MG tablet Take 1 tablet (10 mg total) by mouth daily. 30 tablet 1   famotidine (PEPCID) 40 MG tablet Take 40 mg by mouth daily.     fenofibrate (TRICOR) 145 MG tablet Take 1 tablet (145 mg total) by mouth daily. (Patient not taking: Reported on 06/21/2021) 30 tablet 1   fluticasone (FLONASE) 50 MCG/ACT nasal spray Place 2 sprays into both nostrils 2 (two) times daily.     furosemide (LASIX) 20 MG tablet Take 1 tablet (20 mg total) by mouth daily as needed. 30 tablet 6   gabapentin (NEURONTIN) 300 MG capsule One po q AM, one po q PM and 2 po qHS (Patient taking differently: Take 300 mg by mouth 2 (two) times daily.) 120 capsule 11   glipiZIDE (GLUCOTROL XL) 10 MG 24 hr tablet Take 1 tablet (10 mg total) by mouth daily. 90 tablet 1   glucose blood (ACCU-CHEK GUIDE) test strip Test glucose 2 times a day 100 each 2   Insulin Lispro Prot & Lispro (HUMALOG MIX 75/25 KWIKPEN) (75-25) 100 UNIT/ML Kwikpen Inject 100 Units into the skin 2 (two) times daily with a meal. 60 mL 2   Insulin Pen Needle (B-D ULTRAFINE III SHORT PEN) 31G X 8 MM MISC Use as directed to inject insulin 2 times daily. 200 each 1   levothyroxine (SYNTHROID) 88 MCG tablet Take 88 mcg by mouth daily.     methocarbamol (ROBAXIN) 500 MG tablet Take 1 tablet (500 mg total) by mouth every 8 (eight) hours as needed for muscle spasms. (Patient not taking: Reported on 06/21/2021) 30 tablet 0   nitroGLYCERIN (NITROSTAT) 0.4 MG SL tablet Place 1 tablet (0.4 mg total) under the tongue every 5 (five) minutes as needed for chest pain. 25 tablet 3   ondansetron (ZOFRAN-ODT) 4 MG disintegrating tablet Take 1 tablet (4 mg total) by mouth every 8 (eight) hours as needed for nausea or vomiting. 20 tablet 0   pantoprazole (PROTONIX) 40 MG tablet Take 1 tablet (40 mg total) by mouth daily. 30 tablet 6   sitaGLIPtin (JANUVIA) 50 MG tablet Take 1 tablet  (50 mg total) by mouth daily. 90 tablet 1   VASCEPA 1 g capsule Take 2 capsules (2 g total) by mouth 2 (two) times daily. 360 capsule 1   VENTOLIN HFA 108 (90 Base) MCG/ACT inhaler Inhale into the lungs.     No current facility-administered medications for this visit.     Past Surgical History:  Procedure Laterality Date   APPENDECTOMY     CARDIAC CATHETERIZATION  09/2012   "Nonobstructive CAD with 30% proximal, 40% mid LAD disease; 30% proximal CFX; EF 55-65%"   CHOLECYSTECTOMY N/A 01/27/2013   Procedure: LAPAROSCOPIC CHOLECYSTECTOMY;  Surgeon: Jamesetta So, MD;  Location: [facility];  Service: General;  Laterality: N/A;   CORONARY STENT INTERVENTION N/A 11/15/2017   Procedure: CORONARY STENT INTERVENTION;  Surgeon: Leonie Man, MD;  Location: [facility];  Service: Cardiovascular;  Laterality: N/A;   HERNIA REPAIR     As a child   INSERTION OF MESH N/A 11/13/2012   Procedure: INSERTION OF MESH;  Surgeon: Jamesetta So, MD;  Location: [facility];  Service: General;  Laterality: N/A;   IR ANGIO INTRA EXTRACRAN SEL COM CAROTID INNOMINATE BILAT MOD SED  12/01/2017   IR ANGIO INTRA EXTRACRAN SEL COM CAROTID INNOMINATE BILAT MOD SED  02/13/2018   IR ANGIO VERTEBRAL SEL SUBCLAVIAN INNOMINATE UNI L MOD SED  02/13/2018   IR ANGIO VERTEBRAL SEL VERTEBRAL BILAT MOD SED  12/01/2017   IR ANGIO VERTEBRAL SEL VERTEBRAL UNI R MOD SED  02/13/2018   IR INTRA CRAN STENT  12/03/2017   IR PTA INTRACRANIAL  02/25/2018   IR RADIOLOGIST EVAL & MGMT  07/29/2021   IR US GUIDE VASC ACCESS RIGHT  02/13/2018   KIDNEY STONE SURGERY     LEFT HEART CATH AND CORONARY ANGIOGRAPHY N/A 11/15/2017   Procedure: LEFT HEART CATH AND CORONARY ANGIOGRAPHY;  Surgeon: Leonie Man, MD;  Location: [facility];  Service: Cardiovascular;  Laterality: N/A;   PERCUTANEOUS NEPHROLITHOTRIPSY     RADIOLOGY WITH ANESTHESIA Left 12/03/2017   Procedure: Angioplasty with possible stenting of left VBJ;  Surgeon: Luanne Bras,  MD;  Location: [facility];  Service: Radiology;  Laterality: Left;   RADIOLOGY WITH ANESTHESIA N/A 02/25/2018   Procedure: STENT PLACEMENT;  Surgeon: Luanne Bras, MD;  Location: [facility];  Service: Radiology;  Laterality: N/A;   UMBILICAL HERNIA REPAIR N/A 11/13/2012   Procedure: UMBILICAL HERNIORRHAPHY;  Surgeon: Jamesetta So, MD;  Location: [facility];  Service: General;  Laterality: N/A;   VARICOCELECTOMY       Allergies  Allergen Reactions   Crestor [Rosuvastatin] Other (See Comments)    rhabdomyolosis   Trulicity [Dulaglutide] Nausea And Vomiting   Bee Venom Swelling    SWELLING REACTION UNSPECIFIED       Family History  Problem Relation Age of Onset   Coronary artery disease Father    Hypertension Father    Hyperlipidemia Father    Diabetes Father    Congestive Heart Failure Father 2   Arrhythmia Father        had an ICD   Diabetes Mother    Hypertension Mother    Hyperlipidemia Mother    Obesity Mother 63       died after bariatric surgery, liver failure and infection   Coronary artery disease Maternal Grandfather        both grandfathers and several uncles   Coronary artery disease Other    Diabetes Sister        both sisters   Stroke Maternal Aunt    Stroke Maternal Uncle      Social History [name] reports that he has been smoking cigarettes. He started smoking about 36 years ago. He has a 26.00 pack-year smoking history. He quit smokeless tobacco use about 34 years ago.  His smokeless tobacco use included snuff and chew. Mr.  Devin [name] reports no history of alcohol use.   Review of Systems CONSTITUTIONAL: No weight loss, fever, chills, weakness or fatigue.  HEENT: Eyes: No visual loss, blurred vision, double vision or yellow sclerae.No hearing loss, sneezing, congestion, runny nose or sore throat.  SKIN: No rash or itching.  CARDIOVASCULAR: per hpi RESPIRATORY: chronic SOB GASTROINTESTINAL: No anorexia, nausea, vomiting or diarrhea. No abdominal pain or  blood.  GENITOURINARY: No burning on urination, no polyuria NEUROLOGICAL: No headache, dizziness, syncope, paralysis, ataxia, numbness or tingling in the extremities. No change in bowel or bladder control.  MUSCULOSKELETAL: No muscle, back pain, joint pain or stiffness.  LYMPHATICS: No enlarged nodes. No history of splenectomy.  PSYCHIATRIC: No history of depression or anxiety.  ENDOCRINOLOGIC: No reports of sweating, cold or heat intolerance. No polyuria or polydipsia.  Marland Kitchen   Physical Examination Today's Vitals   12/06/21 1554  BP: 114/80  Pulse: 83  SpO2: 97%  Weight: 257 lb 6.4 oz (116.8 kg)  Height: 6\' 6"  (1.981 m)   Body mass index is 29.75 kg/m.  Gen: resting comfortably, no acute distress HEENT: no scleral icterus, pupils equal round and reactive, no palptable cervical adenopathy,  CV: RRR, no m/rg, no jvd Resp: Clear to auscultation bilaterally GI: abdomen is soft, non-tender, non-distended, normal bowel sounds, no hepatosplenomegaly MSK: extremities are warm, no edema.  Skin: warm, no rash Neuro:  no focal deficits Psych: appropriate affect   Diagnostic Studies 11/2017 cath There is moderate left ventricular systolic dysfunction. The left ventricular ejection fraction is 35-45% by visual estimate. LV end diastolic pressure is mildly elevated. ----------------------------------------- ANGIOGRAPHY- PCI LESION #1: Mid LAD lesion is 80% stenosed. A drug-eluting stent was successfully placed using a STENT SYNERGY DES 2.25X16. -Postdilated to 2.4 mm Post intervention, there is a 0% residual stenosis. LESION #2: mid LAD to Dist LAD lesion is 90% stenosed. A drug-eluting stent was successfully placed using a STENT SYNERGY DES 2.25X12. -Postdilated to 2.4 mm Post intervention, there is a 0% residual stenosis. Prox LAD lesion is 40% stenosed -prior to lesion 1; apical, after lesion 2 A drug-eluting stent was successfully placed using a STENT SYNERGY DES 2.25X12. -Postdilated  to 2.4 mm ----------------------------------------- Post intervention, there is a 0% residual stenosis. Trifurcation 90% lesion at the distal circumflex into LPL and PDA -small caliber distal vessels. Best treated medically as this is not a very good PCI target Prox small caliber nondominant RCA lesion is 99% stenosed.   Severe distal OM 2 -DES PCI with Synergy 2.25 mm x16 mm (2.4 mm); severe trifurcation disease with a very distal AV groove circumflex prior to PDA and PL Ernesta Trabert -medical management.   Subtotally occluded nondominant RCA Moderately reduced LVEF of roughly 40% with mildly elevated LVEDP.   Plan: Transfer to postprocedure unit for post PCI monitoring and TR removal Continue aggressive risk factor modification with statin and beta-blocker etc.   Recommend uninterrupted dual antiplatelet therapy with Aspirin 81mg  daily and Ticagrelor 90mg  twice daily for a minimum of 12 months (ACS - Class I recommendation). -Would strongly consider reducing to 60 mg dose ticagrelor at 9-12 months and continue for an additional year.   Would be okay to stop aspirin after 3 months if necessary.  Okay to stop Brilinta after 6 months for procedures if necessary.     10/2018 nuclear stress There was no ST segment deviation noted during stress. Defect 1: There is a small defect of mild severity present in the mid inferoseptal location. This is likely due to  soft tissue attenuation artifact. This is a low risk study. No ischemic zones. Nuclear stress EF: 46     8/2021echo IMPRESSIONS     1. Left ventricular ejection fraction, by estimation, is 50 to 55%. The  left ventricle has low normal function. The left ventricle has no regional  wall motion abnormalities. There is moderate left ventricular hypertrophy.  Left ventricular diastolic  parameters are indeterminate.   2. Right ventricular systolic function is normal. The right ventricular  size is normal.   3. The mitral valve is normal in  structure. No evidence of mitral valve  regurgitation. No evidence of mitral stenosis.   4. The aortic valve is tricuspid. Aortic valve regurgitation is not  visualized. No aortic stenosis is present.   5. The inferior vena cava is normal in size with greater than 50%  respiratory variability, suggesting right atrial pressure of 3 mmHg.        08/2020 echo   NORMAL LEFT VENTRICULAR FUNCTION WITH MILD LVH    NORMAL LA PRESSURES WITH DIASTOLIC DYSFUNCTION    NORMAL RIGHT VENTRICULAR SYSTOLIC FUNCTION    VALVULAR REGURGITATION: TRIVIAL MR, TRIVIAL TR    NO VALVULAR STENOSIS      Assessment and Plan   1. CAD/ICM/SOB -LVEF has normalized - no recent symptoms, continue current meds   2. Hyperlipidemia - does not tolerate statins, on repatha LDL down to 86 - TGs remain very high due to HgbA1c of 14, continue to follow with endocrine. Continue vascepa and fenofibrate. Treatment would be getting A1c down further, no other TG specific medication options already on dual therapy         F/u 6 months    Arnoldo Lenis, M.D

## 2022-01-05 ENCOUNTER — Other Ambulatory Visit (HOSPITAL_COMMUNITY): Payer: Self-pay | Admitting: Emergency Medicine

## 2022-01-05 ENCOUNTER — Ambulatory Visit (HOSPITAL_COMMUNITY)
Admission: RE | Admit: 2022-01-05 | Discharge: 2022-01-05 | Disposition: A | Discharge status: Home / Self Care | Payer: Medicare Other | Source: Ambulatory Visit | Attending: Emergency Medicine | Admitting: Emergency Medicine

## 2022-01-05 DIAGNOSIS — Z79899 Other long term (current) drug therapy: Secondary | ICD-10-CM | POA: Diagnosis not present

## 2022-01-05 DIAGNOSIS — I639 Cerebral infarction, unspecified: Secondary | ICD-10-CM | POA: Diagnosis not present

## 2022-01-05 DIAGNOSIS — R200 Anesthesia of skin: Secondary | ICD-10-CM | POA: Diagnosis not present

## 2022-01-05 DIAGNOSIS — R4182 Altered mental status, unspecified: Secondary | ICD-10-CM | POA: Diagnosis not present

## 2022-01-05 DIAGNOSIS — I670 Dissection of cerebral arteries, nonruptured: Secondary | ICD-10-CM

## 2022-01-05 DIAGNOSIS — M542 Cervicalgia: Secondary | ICD-10-CM | POA: Diagnosis not present

## 2022-01-05 DIAGNOSIS — E039 Hypothyroidism, unspecified: Secondary | ICD-10-CM | POA: Diagnosis not present

## 2022-01-05 DIAGNOSIS — R1084 Generalized abdominal pain: Secondary | ICD-10-CM | POA: Diagnosis not present

## 2022-01-05 DIAGNOSIS — R531 Weakness: Secondary | ICD-10-CM | POA: Diagnosis not present

## 2022-01-05 DIAGNOSIS — I2510 Atherosclerotic heart disease of native coronary artery without angina pectoris: Secondary | ICD-10-CM | POA: Diagnosis not present

## 2022-01-05 DIAGNOSIS — E1140 Type 2 diabetes mellitus with diabetic neuropathy, unspecified: Secondary | ICD-10-CM | POA: Diagnosis not present

## 2022-01-05 DIAGNOSIS — Z7902 Long term (current) use of antithrombotics/antiplatelets: Secondary | ICD-10-CM | POA: Diagnosis not present

## 2022-01-05 DIAGNOSIS — R079 Chest pain, unspecified: Secondary | ICD-10-CM | POA: Diagnosis not present

## 2022-01-06 DIAGNOSIS — I63239 Cerebral infarction due to unspecified occlusion or stenosis of unspecified carotid arteries: Secondary | ICD-10-CM | POA: Insufficient documentation

## 2022-01-06 DIAGNOSIS — I6522 Occlusion and stenosis of left carotid artery: Secondary | ICD-10-CM | POA: Insufficient documentation

## 2022-01-06 DIAGNOSIS — M542 Cervicalgia: Secondary | ICD-10-CM | POA: Insufficient documentation

## 2022-01-06 DIAGNOSIS — R410 Disorientation, unspecified: Secondary | ICD-10-CM | POA: Diagnosis not present

## 2022-01-06 DIAGNOSIS — E1140 Type 2 diabetes mellitus with diabetic neuropathy, unspecified: Secondary | ICD-10-CM | POA: Diagnosis not present

## 2022-01-06 DIAGNOSIS — R531 Weakness: Secondary | ICD-10-CM | POA: Insufficient documentation

## 2022-01-06 DIAGNOSIS — F17210 Nicotine dependence, cigarettes, uncomplicated: Secondary | ICD-10-CM | POA: Insufficient documentation

## 2022-01-06 DIAGNOSIS — Z95828 Presence of other vascular implants and grafts: Secondary | ICD-10-CM | POA: Diagnosis not present

## 2022-01-06 DIAGNOSIS — I670 Dissection of cerebral arteries, nonruptured: Secondary | ICD-10-CM | POA: Diagnosis present

## 2022-01-06 DIAGNOSIS — R55 Syncope and collapse: Secondary | ICD-10-CM | POA: Diagnosis not present

## 2022-01-06 DIAGNOSIS — Z7902 Long term (current) use of antithrombotics/antiplatelets: Secondary | ICD-10-CM | POA: Diagnosis not present

## 2022-01-06 DIAGNOSIS — G9340 Encephalopathy, unspecified: Secondary | ICD-10-CM | POA: Diagnosis not present

## 2022-01-06 DIAGNOSIS — R42 Dizziness and giddiness: Secondary | ICD-10-CM | POA: Insufficient documentation

## 2022-01-06 DIAGNOSIS — K110 Atrophy of salivary gland: Secondary | ICD-10-CM | POA: Diagnosis not present

## 2022-01-06 DIAGNOSIS — I3489 Other nonrheumatic mitral valve disorders: Secondary | ICD-10-CM | POA: Diagnosis not present

## 2022-01-06 DIAGNOSIS — Z7982 Long term (current) use of aspirin: Secondary | ICD-10-CM | POA: Diagnosis not present

## 2022-01-06 DIAGNOSIS — Z79890 Hormone replacement therapy (postmenopausal): Secondary | ICD-10-CM | POA: Diagnosis not present

## 2022-01-06 DIAGNOSIS — I639 Cerebral infarction, unspecified: Secondary | ICD-10-CM | POA: Diagnosis not present

## 2022-01-06 DIAGNOSIS — G8194 Hemiplegia, unspecified affecting left nondominant side: Secondary | ICD-10-CM | POA: Insufficient documentation

## 2022-01-06 DIAGNOSIS — E039 Hypothyroidism, unspecified: Secondary | ICD-10-CM | POA: Diagnosis not present

## 2022-01-06 DIAGNOSIS — I10 Essential (primary) hypertension: Secondary | ICD-10-CM | POA: Diagnosis not present

## 2022-01-06 DIAGNOSIS — I2510 Atherosclerotic heart disease of native coronary artery without angina pectoris: Secondary | ICD-10-CM | POA: Diagnosis not present

## 2022-01-06 MED ORDER — IOHEXOL 350 MG/ML SOLN
100.0000 mL | Freq: Once | INTRAVENOUS | Status: AC | PRN
  Administered 2022-01-06: 100 mL via INTRAVENOUS

## 2022-01-07 DIAGNOSIS — E1140 Type 2 diabetes mellitus with diabetic neuropathy, unspecified: Secondary | ICD-10-CM | POA: Diagnosis not present

## 2022-01-07 DIAGNOSIS — R519 Headache, unspecified: Secondary | ICD-10-CM | POA: Diagnosis not present

## 2022-01-07 DIAGNOSIS — M542 Cervicalgia: Secondary | ICD-10-CM | POA: Diagnosis not present

## 2022-01-07 DIAGNOSIS — E119 Type 2 diabetes mellitus without complications: Secondary | ICD-10-CM | POA: Diagnosis not present

## 2022-01-07 DIAGNOSIS — I69952 Hemiplegia and hemiparesis following unspecified cerebrovascular disease affecting left dominant side: Secondary | ICD-10-CM | POA: Diagnosis not present

## 2022-01-07 DIAGNOSIS — I2510 Atherosclerotic heart disease of native coronary artery without angina pectoris: Secondary | ICD-10-CM | POA: Diagnosis not present

## 2022-01-07 DIAGNOSIS — R42 Dizziness and giddiness: Secondary | ICD-10-CM | POA: Diagnosis not present

## 2022-01-07 DIAGNOSIS — R54 Age-related physical debility: Secondary | ICD-10-CM | POA: Diagnosis not present

## 2022-01-07 DIAGNOSIS — E039 Hypothyroidism, unspecified: Secondary | ICD-10-CM | POA: Diagnosis not present

## 2022-01-07 DIAGNOSIS — I63329 Cerebral infarction due to thrombosis of unspecified anterior cerebral artery: Secondary | ICD-10-CM | POA: Diagnosis not present

## 2022-01-16 DIAGNOSIS — E785 Hyperlipidemia, unspecified: Secondary | ICD-10-CM | POA: Diagnosis not present

## 2022-01-16 DIAGNOSIS — I2510 Atherosclerotic heart disease of native coronary artery without angina pectoris: Secondary | ICD-10-CM | POA: Diagnosis not present

## 2022-01-16 DIAGNOSIS — E1140 Type 2 diabetes mellitus with diabetic neuropathy, unspecified: Secondary | ICD-10-CM | POA: Diagnosis not present

## 2022-01-16 DIAGNOSIS — E039 Hypothyroidism, unspecified: Secondary | ICD-10-CM | POA: Diagnosis not present

## 2022-01-16 DIAGNOSIS — I10 Essential (primary) hypertension: Secondary | ICD-10-CM | POA: Diagnosis not present

## 2022-01-16 DIAGNOSIS — F17200 Nicotine dependence, unspecified, uncomplicated: Secondary | ICD-10-CM | POA: Diagnosis not present

## 2022-01-16 DIAGNOSIS — J449 Chronic obstructive pulmonary disease, unspecified: Secondary | ICD-10-CM | POA: Diagnosis not present

## 2022-01-16 DIAGNOSIS — K219 Gastro-esophageal reflux disease without esophagitis: Secondary | ICD-10-CM | POA: Diagnosis not present

## 2022-01-16 DIAGNOSIS — R519 Headache, unspecified: Secondary | ICD-10-CM | POA: Diagnosis not present

## 2022-01-16 DIAGNOSIS — E1159 Type 2 diabetes mellitus with other circulatory complications: Secondary | ICD-10-CM | POA: Diagnosis not present

## 2022-01-17 ENCOUNTER — Other Ambulatory Visit: Payer: Self-pay | Admitting: "Endocrinology

## 2022-01-24 DIAGNOSIS — D4102 Neoplasm of uncertain behavior of left kidney: Secondary | ICD-10-CM | POA: Diagnosis not present

## 2022-02-06 ENCOUNTER — Other Ambulatory Visit: Payer: Self-pay | Admitting: Cardiology

## 2022-02-06 ENCOUNTER — Other Ambulatory Visit: Payer: Self-pay | Admitting: "Endocrinology

## 2022-02-07 ENCOUNTER — Ambulatory Visit: Payer: Medicare Other | Admitting: Physician Assistant

## 2022-02-15 ENCOUNTER — Encounter: Payer: Self-pay | Admitting: Neurology

## 2022-02-15 ENCOUNTER — Ambulatory Visit (INDEPENDENT_AMBULATORY_CARE_PROVIDER_SITE_OTHER): Payer: Medicare Other | Admitting: Neurology

## 2022-02-15 VITALS — BP 144/87 | HR 83 | Ht 78.0 in | Wt 253.0 lb

## 2022-02-15 DIAGNOSIS — I10 Essential (primary) hypertension: Secondary | ICD-10-CM | POA: Diagnosis not present

## 2022-02-15 DIAGNOSIS — I2510 Atherosclerotic heart disease of native coronary artery without angina pectoris: Secondary | ICD-10-CM

## 2022-02-15 DIAGNOSIS — G450 Vertebro-basilar artery syndrome: Secondary | ICD-10-CM | POA: Diagnosis not present

## 2022-02-15 DIAGNOSIS — I6503 Occlusion and stenosis of bilateral vertebral arteries: Secondary | ICD-10-CM

## 2022-02-15 DIAGNOSIS — I639 Cerebral infarction, unspecified: Secondary | ICD-10-CM

## 2022-02-15 DIAGNOSIS — R200 Anesthesia of skin: Secondary | ICD-10-CM | POA: Diagnosis not present

## 2022-02-15 NOTE — Progress Notes (Signed)
[facility]  PATIENT: Devin [name] DOB: 1970/06/18  REFERRING DOCTOR OR PCP: [facility] SOURCE: Patient, notes from primary care, imaging reports and MRI and CTA images personally reviewed  _________________________________   HISTORICAL  CHIEF COMPLAINT:  Chief Complaint  Patient presents with   Follow-up    Pt in room #10 with his sister. Pt here today for f/u stroke.    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  Update 02/15/2022 He reports more difficulty with vision and other symptoms over the past few months.   He has several minutes of tunnel vision, most easily noted while watching TV. He has these 3 times a week x 3-4 minutes.    He also has a left ringing in the ear when this occurs - this can last for hours.   He has other episodes of room spinning vertigo, sometimes awakening him.  He notes balance is poor when any ot the visual or spinning events occurs.  .    With 1 recent episode in October, symptoms were more severe.  His wife reports that he also had short loss of consciousness without episode and the ambulance was called to take him to the emergency room.  MRI of the head 01/06/2022 showed the old strokes in the brainstem and cerebellum.  Additionally there was a report of a 3 mm acute stroke in the right frontal lobe.  I reviewed the images and am uncertain if this actually represents a small acute ischemic focus as it does not appear on coronal diffusion weighted images nor on FLAIR or T2 weighted images.  Of note, the internal carotid artery stenosis is on the left not the right.  CT angiogram 01/05/2022 confirmed the findings from earlier in the year of restenosis in the vertebral stent and stenosis in the distal right V4 segment.  Additionally there is hemodynamically  significant stenosis in the left anterior carotid artery.  The stroke August 2019 that has affected the left strength and sensation.  This followed stents in the heart.    He notes numbness and pain in the left arm worse than leg since 2019 (CVA)    The numbness is in arm and hand.  Quality is tingling and can be painful at times when it intensifies.  He has intermittent neck pain and tingling.    He had vertebral artery disease and underwent vertebral artery stenting 11/2017 and 2nd intervention for re-stenosis within the stent 02/2018 (Dr. Corliss Skains).   He had multiple small pontine/left occipital lobe/cerebellum strokes 01/06/2018.      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  Hyperlipidemia  He has been on Plavix and bASA since 2019  He is on Repatha and Zetia, fenofibrate, and Vascepa for lipids.      He saw Dr. Corliss Skains 07/28/2021 who noted 80-85% stenosis LICA and restenosis left vertebral stent near VB junction.  Before considering a procedure he had wanted input from nephrology.  mr. Piccini his nephrologist has said that he could undergo angiographic procedures  He gets occasional headaches and sometimes has nausea when ne occurs.  He has 2-3 mild HA every week and one severe one with  migrainous features.  He usually does not take any medicine but just lays down with the more severe ones until they pass 4 to 6 hours later  IMAGING MRI brain10/27/2023 3 mm focus of apparent signal abnormality within the right  frontoparietal white matter, appreciated on the axial  diffusion-weighted sequence only. This may reflect a small acute  infarct or artifact.   Redemonstrated chronic infarcts within the right pons and bilateral  cerebellar hemispheres.   Small scattered foci of T2 FLAIR hyperintense signal abnormality  within  the cerebral white matter, nonspecific but likely reflecting  minimal changes of chronic small vessel ischemia.   CT/CTA 01/05/2022 IMPRESSION: 1. No acute intracranial process. 2. No intracranial large vessel occlusion. Unchanged severe stenosis in the proximal aspect of the vertebrobasilar stent. 3. Unchanged approximately 65% stenosis at the origin of the left ICA.   I reviewed multiple imaging studies: CTA head/neck 10/15/2019 showed patent stent from left V4 to basilar artery and stable occluded distal right V4 segment.  MRI of the brain and MR angiogram 07/28/2018: MRI   MR angiogram shows occlusion of the distal right vertebral artery and normal flow in the left vertebral artery and the basilar artery  MRI of the brain January 06, 2018 showed several very small strokes in the distribution of the left vertebral and basilar artery.     REVIEW OF SYSTEMS: Constitutional: No fevers, chills, sweats, or change in appetite Eyes: No visual changes, double vision, eye pain Ear, nose and throat: No hearing loss, ear pain, nasal congestion, sore throat Cardiovascular: No chest pain, palpitations Respiratory:  No shortness of breath at rest or with exertion.   No wheezes GastrointestinaI: No nausea, vomiting, diarrhea, abdominal pain, fecal incontinence Genitourinary:  No dysuria, urinary retention or frequency.  No nocturia. Musculoskeletal:  No neck pain, back pain Integumentary: No rash, pruritus, skin lesions Neurological: as above Psychiatric: No depression at this time.  No anxiety Endocrine: No palpitations, diaphoresis, change in appetite, change in weigh or increased thirst Hematologic/Lymphatic:  No anemia, purpura, petechiae. Allergic/Immunologic: No itchy/runny eyes, nasal congestion, recent allergic reactions, rashes  ALLERGIES: Allergies  Allergen Reactions   Crestor [Rosuvastatin] Other (See Comments)    rhabdomyolosis   Trulicity [Dulaglutide] Nausea And Vomiting    Bee Venom Swelling    SWELLING REACTION UNSPECIFIED     HOME MEDICATIONS:  Current Outpatient Medications:    aspirin 81 MG EC tablet, Take 1 tablet (81 mg total) by mouth daily. With food, Disp: 30 tablet, Rfl: 0   clopidogrel (PLAVIX) 75 MG tablet, Take 1 tablet (75 mg total) by mouth daily., Disp: 30 tablet, Rfl: 1   Continuous Blood Gluc Receiver (FREESTYLE LIBRE 2 READER) DEVI, USE AS DIRECTED, Disp: 1 each, Rfl: 0   Continuous Blood Gluc Sensor (FREESTYLE LIBRE 2 SENSOR) MISC, APPLY 1 PATCH TO SKIN EVERY 14 DAYS, Disp: 2 each, Rfl: 0   docusate sodium (COLACE) 100 MG capsule, Take 100 mg by mouth daily., Disp: , Rfl:    DULERA 200-5 MCG/ACT AERO, Inhale 2 puffs into the lungs 2 (two) times daily., Disp: 8.8 g, Rfl: 1   EUTHYROX 200 MCG tablet, Take 200 mcg by mouth at bedtime., Disp: , Rfl:    Evolocumab (REPATHA SURECLICK) 140 MG/ML SOAJ, Inject 1 pen into the skin every 14 (fourteen) days., Disp: 1 mL, Rfl: 11   ezetimibe (ZETIA) 10 MG tablet, Take 1 tablet (10 mg total) by mouth daily., Disp: 30 tablet, Rfl: 1   fenofibrate (TRICOR) 145 MG  tablet, Take 1 tablet (145 mg total) by mouth daily., Disp: 30 tablet, Rfl: 1   fluticasone (FLONASE) 50 MCG/ACT nasal spray, Place 2 sprays into both nostrils 2 (two) times daily., Disp: , Rfl:    furosemide (LASIX) 20 MG tablet, Take 1 tablet (20 mg total) by mouth daily as needed., Disp: 30 tablet, Rfl: 6   gabapentin (NEURONTIN) 300 MG capsule, One po q AM, one po q PM and 2 po qHS (Patient taking differently: Take 300 mg by mouth 2 (two) times daily.), Disp: 120 capsule, Rfl: 11   glipiZIDE (GLUCOTROL XL) 10 MG 24 hr tablet, Take 1 tablet (10 mg total) by mouth daily., Disp: 90 tablet, Rfl: 1   glucose blood (ACCU-CHEK GUIDE) test strip, Test glucose 2 times a day, Disp: 100 each, Rfl: 2   icosapent Ethyl (VASCEPA) 1 g capsule, Take 2 capsules by mouth twice daily, Disp: 360 capsule, Rfl: 0   Insulin Lispro Prot & Lispro (HUMALOG MIX 75/25  KWIKPEN) (75-25) 100 UNIT/ML Kwikpen, Inject 100 Units into the skin 2 (two) times daily with a meal., Disp: 60 mL, Rfl: 2   Insulin Pen Needle (B-D ULTRAFINE III SHORT PEN) 31G X 8 MM MISC, Use as directed to inject insulin 2 times daily., Disp: 200 each, Rfl: 1   levothyroxine (SYNTHROID) 88 MCG tablet, Take 88 mcg by mouth daily., Disp: , Rfl:    nitroGLYCERIN (NITROSTAT) 0.4 MG SL tablet, Place 1 tablet (0.4 mg total) under the tongue every 5 (five) minutes as needed for chest pain., Disp: 25 tablet, Rfl: 3   ondansetron (ZOFRAN-ODT) 4 MG disintegrating tablet, Take 1 tablet (4 mg total) by mouth every 8 (eight) hours as needed for nausea or vomiting., Disp: 20 tablet, Rfl: 0   pantoprazole (PROTONIX) 40 MG tablet, Take 1 tablet (40 mg total) by mouth daily., Disp: 30 tablet, Rfl: 6   sitaGLIPtin (JANUVIA) 50 MG tablet, Take 1 tablet (50 mg total) by mouth daily., Disp: 90 tablet, Rfl: 1   VENTOLIN HFA 108 (90 Base) MCG/ACT inhaler, Inhale into the lungs., Disp: , Rfl:    famotidine (PEPCID) 40 MG tablet, Take 40 mg by mouth daily. (Patient not taking: Reported on 02/15/2022), Disp: , Rfl:   PAST MEDICAL HISTORY: Past Medical History:  Diagnosis Date   Asthma    Chest pain    CHF (congestive heart failure) (HCC)    Coronary artery disease    a. s/p DES x2 to LAD and DES to OM in 11/2017   Depression    Dyspnea    GERD (gastroesophageal reflux disease)    Headache    History of kidney stones    History of rhabdomyolysis    Hyperlipidemia    Hypertension    Hypothyroidism    Ischemic cardiomyopathy    a. 11/2017: echo showing EF of 35-40%, diffuse HK, and Grade 2 DD   Kidney calculus 2014   Pericardial effusion    Small, by dobutamine echocardiogram, 04/2006   PONV (postoperative nausea and vomiting)    Sleep apnea    does not wear cpap   Stroke (HCC)    Tobacco abuse    Type 2 diabetes mellitus without complications (HCC) 10/07/2013   Vitamin D deficiency     PAST SURGICAL  HISTORY: Past Surgical History:  Procedure Laterality Date   APPENDECTOMY     CARDIAC CATHETERIZATION  09/2012   "Nonobstructive CAD with 30% proximal, 40% mid LAD disease; 30% proximal CFX; EF 55-65%"   CHOLECYSTECTOMY  N/A 01/27/2013   Procedure: LAPAROSCOPIC CHOLECYSTECTOMY;  Surgeon: Dalia Heading, MD;  Location: [facility];  Service: General;  Laterality: N/A;   CORONARY STENT INTERVENTION N/A 11/15/2017   Procedure: CORONARY STENT INTERVENTION;  Surgeon: Marykay Lex, MD;  Location: [facility] INVASIVE CV LAB;  Service: Cardiovascular;  Laterality: N/A;   HERNIA REPAIR     As a child   INSERTION OF MESH N/A 11/13/2012   Procedure: INSERTION OF MESH;  Surgeon: Dalia Heading, MD;  Location: [facility];  Service: General;  Laterality: N/A;   IR ANGIO INTRA EXTRACRAN SEL COM CAROTID INNOMINATE BILAT MOD SED  12/01/2017   IR ANGIO INTRA EXTRACRAN SEL COM CAROTID INNOMINATE BILAT MOD SED  02/13/2018   IR ANGIO VERTEBRAL SEL SUBCLAVIAN INNOMINATE UNI L MOD SED  02/13/2018   IR ANGIO VERTEBRAL SEL VERTEBRAL BILAT MOD SED  12/01/2017   IR ANGIO VERTEBRAL SEL VERTEBRAL UNI R MOD SED  02/13/2018   IR INTRA CRAN STENT  12/03/2017   IR PTA INTRACRANIAL  02/25/2018   IR RADIOLOGIST EVAL & MGMT  07/29/2021   IR US GUIDE VASC ACCESS RIGHT  02/13/2018   KIDNEY STONE SURGERY     LEFT HEART CATH AND CORONARY ANGIOGRAPHY N/A 11/15/2017   Procedure: LEFT HEART CATH AND CORONARY ANGIOGRAPHY;  Surgeon: Marykay Lex, MD;  Location: [facility] INVASIVE CV LAB;  Service: Cardiovascular;  Laterality: N/A;   PERCUTANEOUS NEPHROLITHOTRIPSY     RADIOLOGY WITH ANESTHESIA Left 12/03/2017   Procedure: Angioplasty with possible stenting of left VBJ;  Surgeon: Julieanne Cotton, MD;  Location: [facility] OR;  Service: Radiology;  Laterality: Left;   RADIOLOGY WITH ANESTHESIA N/A 02/25/2018   Procedure: STENT PLACEMENT;  Surgeon: Julieanne Cotton, MD;  Location: [facility] OR;  Service: Radiology;  Laterality: N/A;   UMBILICAL HERNIA REPAIR N/A 11/13/2012    Procedure: UMBILICAL HERNIORRHAPHY;  Surgeon: Dalia Heading, MD;  Location: [facility];  Service: General;  Laterality: N/A;   VARICOCELECTOMY      FAMILY HISTORY: Family History  Problem Relation Age of Onset   Coronary artery disease Father    Hypertension Father    Hyperlipidemia Father    Diabetes Father    Congestive Heart Failure Father 69   Arrhythmia Father        had an ICD   Diabetes Mother    Hypertension Mother    Hyperlipidemia Mother    Obesity Mother 52       died after bariatric surgery, liver failure and infection   Coronary artery disease Maternal Grandfather        both grandfathers and several uncles   Coronary artery disease Other    Diabetes Sister        both sisters   Stroke Maternal Aunt    Stroke Maternal Uncle     SOCIAL HISTORY:  Social History   Socioeconomic History   Marital status: Divorced    Spouse name: Not on file   Number of children: Not on file   Years of education: Not on file   Highest education level: Not on file  Occupational History   Occupation: Disability     Comment:      Employer: [employer]  Tobacco Use   Smoking status: Every Day    Packs/day: 1.00    Years: 26.00    Total pack years: 26.00    Types: Cigarettes    Start date: 03/13/1985    Passive exposure: Never   Smokeless tobacco: Former  Types: Snuff, Chew    Quit date: 03/14/1987  Vaping Use   Vaping Use: Former   Devices: quit in 2011  Substance and Sexual Activity   Alcohol use: No   Drug use: No   Sexual activity: Not on file  Other Topics Concern   Not on file  Social History Narrative   Eats fast food daily   No exercise outside of work   Lives in [place] with his dog   MAINTENANCE TECHNICIAN   Caffeine use: tea/soda daily   Left handed    Social Determinants of Health   Financial Resource Strain: Not on file  Food Insecurity: Not on file  Transportation Needs: Not on file  Physical Activity: Not on file  Stress: Not on file  Social  Connections: Not on file  Intimate Partner Violence: Not on file     PHYSICAL EXAM  Vitals:   02/15/22 1251  BP: (!) 144/87  Pulse: 83  Weight: 253 lb (114.8 kg)  Height:  (1.981 m)    Body mass index is 29.24 kg/m.   General: The patient is well-developed and well-nourished and in no acute distress  HEENT:  Head is [place]/AT.  Sclera are anicteric.   Neck left carotid bruit.  The neck is nontender. :  Cardiovascular: The heart has a regular rate and rhythm with a normal S1 and S2. There were no murmurs, gallops or rubs.    Skin: Extremities are without rash or  edema.  Musculoskeletal:  Back is nontender  Neurologic Exam  Mental status: The patient is alert and oriented x 3 at the time of the examination. The patient has apparent normal recent and remote memory, with an apparently normal attention span and concentration ability.   Speech is normal.  Cranial nerves: Extraocular movements are full. Pupils are equal, round, and reactive to light and accomodation. Redcued VA on the left vs right.   Facial symmetry is present. There is good facial sensation to soft touch bilaterally.Facial strength is normal.  Trapezius and sternocleidomastoid strength is normal. No dysarthria is noted.  The tongue is midline, and the patient has symmetric elevation of the soft palate. No obvious hearing deficits are noted.  Motor:  Muscle bulk is normal.   Tone is normal. Strength is  5 / 5 in all 4 extremities.   Sensory: He has reduced sensation to touch and vibration in the left arm and leg compared to the right side.  There is further reduction of sensation in the hand over the thenar eminence  Coordination: Cerebellar testing reveals reduced left finger-nose-finger and reduced left heel-to-shin.  Gait and station: Station is normal.   Gait is mildly wide.  Tandem is poor.. Romberg is negative.   Reflexes: Deep tendon reflexes are symmetric in the arms but increased in the left leg  relative to the right leg..   Plantar responses are flexor.    DIAGNOSTIC DATA (LABS, IMAGING, TESTING) - I reviewed patient records, labs, notes, testing and imaging myself where available.  Lab Results  Component Value Date   WBC 7.0 10/17/2021   HGB 13.5 10/17/2021   HCT 39.8 10/17/2021   MCV 83.8 10/17/2021   PLT 221 10/17/2021      Component Value Date/Time   NA 137 10/17/2021 0648   NA 135 06/13/2021 1344   K 3.7 10/17/2021 0648   CL 105 10/17/2021 0648   CO2 23 10/17/2021 0648   GLUCOSE 241 (H) 10/17/2021 0648   BUN 10 10/17/2021  0648   BUN 20 06/13/2021 1344   CREATININE 0.83 10/17/2021 0648   CALCIUM 9.0 10/17/2021 0648   PROT 5.3 (L) 10/16/2021 0417   PROT 7.6 06/13/2021 1344   ALBUMIN 2.8 (L) 10/16/2021 0417   ALBUMIN 4.6 06/13/2021 1344   AST 24 10/16/2021 0417   ALT 42 10/16/2021 0417   ALKPHOS 97 10/16/2021 0417   BILITOT 0.9 10/16/2021 0417   BILITOT 0.2 06/13/2021 1344   GFRNONAA >60 10/17/2021 0648   GFRAA >60 10/15/2019 0728   Lab Results  Component Value Date   CHOL 232 (H) 06/13/2021   HDL 26 (L) 06/13/2021   LDLCALC 86 06/13/2021   LDLDIRECT 143 (H) 12/23/2019   TRIG 730 (HH) 06/13/2021   CHOLHDL 8.9 (H) 06/13/2021   Lab Results  Component Value Date   HGBA1C 14.7 (H) 10/15/2021   No results found for: "VITAMINB12" Lab Results  Component Value Date   TSH 1.282 10/15/2021        ASSESSMENT AND PLAN  Stenosis of both vertebral arteries - Plan: Ambulatory referral to Interventional Radiology  Vertebrobasilar insufficiency - Plan: Ambulatory referral to Interventional Radiology  Coronary artery disease, unspecified vessel or lesion type, unspecified whether angina present, unspecified whether native or transplanted heart  Cerebellar stroke (HCC)  Essential hypertension, benign  Left arm numbness   I discussed with him and his sister that I believe the episodes of visual changes and/or vertigo could be due to vertebrobasilar  insufficiency and I am very concerned about the stent restenosis.  I would like him to follow-up with Dr. Corliss Skains at interventional neuroradiology to determine if an additional procedure is recommended.  He also has left internal carotid artery stenosis of about 80%.  This has not been symptomatic though is at a point where an intervention may also be necessary.   Reviewing the images from 01/06/2022, I am not convinced that there was a new 3 mm stroke.  The coronal diffusion weighted images and the other sequences do not confirm that lesion so could be artifact.  If it was a tiny microvascular infarction, it is so small it would be asymptomatic and is not on the more diseased ICA side.  Continue aspirin and Plavix. Ubrelvy 100 mg   #2 samples Lot [id number], Exp 11/2023 for migraine Return in 6 months or sooner if there are new or worsening neurologic symptoms  45-minute office visit with the majority of the time spent face-to-face for history and physical, discussion/counseling and decision-making.  Additional time with record review and documentation.   Tarhonda Hollenberg A. Epimenio Foot, MD, [facility] 02/15/2022, 3:23 PM Certified in Neurology, Clinical Neurophysiology, Sleep Medicine and Neuroimaging  [facility] [address] [place], [place] [address] [phone]

## 2022-02-23 ENCOUNTER — Other Ambulatory Visit (HOSPITAL_COMMUNITY): Payer: Self-pay | Admitting: Interventional Radiology

## 2022-02-23 DIAGNOSIS — I771 Stricture of artery: Secondary | ICD-10-CM

## 2022-02-28 ENCOUNTER — Other Ambulatory Visit: Payer: Self-pay | Admitting: "Endocrinology

## 2022-03-09 ENCOUNTER — Ambulatory Visit (HOSPITAL_COMMUNITY): Payer: Medicare Other

## 2022-03-16 ENCOUNTER — Other Ambulatory Visit: Payer: Self-pay | Admitting: Radiology

## 2022-03-16 DIAGNOSIS — H9319 Tinnitus, unspecified ear: Secondary | ICD-10-CM

## 2022-03-16 NOTE — H&P (Incomplete)
Chief Complaint: Vertebral artery stenosis  Referring Physician(s): Arlice Colt  Supervising Physician: Luanne Bras  Patient Status: [facility] [place] [facility]  History of Present Illness: Devin [name] is a 52 y.o. 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  He has history of vertebral stent done by Dr. [name] back on 12/06/2017.  He then developed severe pre occlusive stenosis in the distal portion of the stented segment of the basilar artery and underwent angioplasty on 02/27/18.  He is having visual changes and vertigo.  CTA done 01/06/22 showed=  severe stenosis in the proximal aspect of the vertebrobasilar stent.  He is here today for diagnostic angiography.  He is NPO.   Past Medical History:  Diagnosis Date   Asthma    Chest pain    CHF (congestive heart failure) ([place])    Coronary artery disease    a. s/p DES x2 to LAD and DES to OM in 11/2017   Depression    Dyspnea    GERD (gastroesophageal reflux disease)    Headache    History of kidney stones    History of rhabdomyolysis    Hyperlipidemia    Hypertension    Hypothyroidism    Ischemic cardiomyopathy    a. 11/2017: echo showing EF of 35-40%, diffuse HK, and Grade 2 DD   Kidney calculus 2014   Pericardial effusion    Small, by dobutamine echocardiogram, 04/2006   PONV (postoperative nausea and vomiting)    Sleep apnea    does not wear cpap   Stroke ([place])    Tobacco abuse    Type 2 diabetes mellitus without complications ([place]) 5/46/5035   Vitamin D deficiency     Past Surgical History:  Procedure Laterality Date   APPENDECTOMY     CARDIAC CATHETERIZATION  09/2012   "Nonobstructive CAD with 30% proximal, 40% mid LAD disease; 30% proximal CFX; EF 55-65%"   CHOLECYSTECTOMY N/A 01/27/2013   Procedure: LAPAROSCOPIC CHOLECYSTECTOMY;  Surgeon: Jamesetta So, MD;  Location: [facility];  Service: General;  Laterality: N/A;   CORONARY STENT INTERVENTION N/A  11/15/2017   Procedure: CORONARY STENT INTERVENTION;  Surgeon: Leonie Man, MD;  Location: [facility];  Service: Cardiovascular;  Laterality: N/A;   HERNIA REPAIR     As a child   INSERTION OF MESH N/A 11/13/2012   Procedure: INSERTION OF MESH;  Surgeon: Jamesetta So, MD;  Location: [facility];  Service: General;  Laterality: N/A;   IR ANGIO INTRA EXTRACRAN SEL COM CAROTID INNOMINATE BILAT MOD SED  12/01/2017   IR ANGIO INTRA EXTRACRAN SEL COM CAROTID INNOMINATE BILAT MOD SED  02/13/2018   IR ANGIO VERTEBRAL SEL SUBCLAVIAN INNOMINATE UNI L MOD SED  02/13/2018   IR ANGIO VERTEBRAL SEL VERTEBRAL BILAT MOD SED  12/01/2017   IR ANGIO VERTEBRAL SEL VERTEBRAL UNI R MOD SED  02/13/2018   IR INTRA CRAN STENT  12/03/2017   IR PTA INTRACRANIAL  02/25/2018   IR RADIOLOGIST EVAL & MGMT  07/29/2021   IR US GUIDE VASC ACCESS RIGHT  02/13/2018   KIDNEY STONE SURGERY     LEFT HEART CATH AND CORONARY ANGIOGRAPHY N/A 11/15/2017   Procedure: LEFT HEART CATH AND CORONARY ANGIOGRAPHY;  Surgeon: Leonie Man, MD;  Location: [place] [facility];  Service: Cardiovascular;  Laterality: N/A;   PERCUTANEOUS NEPHROLITHOTRIPSY     RADIOLOGY WITH ANESTHESIA Left 12/03/2017   Procedure: Angioplasty with possible stenting of left  VBJ;  Surgeon: Julieanne Cotton, MD;  Location: [facility] OR;  Service: Radiology;  Laterality: Left;   RADIOLOGY WITH ANESTHESIA N/A 02/25/2018   Procedure: STENT PLACEMENT;  Surgeon: Julieanne Cotton, MD;  Location: [facility] OR;  Service: Radiology;  Laterality: N/A;   UMBILICAL HERNIA REPAIR N/A 11/13/2012   Procedure: UMBILICAL HERNIORRHAPHY;  Surgeon: Dalia Heading, MD;  Location: [facility];  Service: General;  Laterality: N/A;   VARICOCELECTOMY      Allergies: Crestor [rosuvastatin], Trulicity [dulaglutide], and Bee venom  Medications: Prior to Admission medications   Medication Sig Start Date End Date Taking? Authorizing Provider  aspirin 81 MG EC tablet Take 1 tablet (81 mg total) by mouth  daily. With food 12/05/17   Shon Hale, MD  clopidogrel (PLAVIX) 75 MG tablet Take 1 tablet (75 mg total) by mouth daily. 10/15/19   Cleora Fleet, MD  Continuous Blood Gluc Receiver (FREESTYLE LIBRE 2 READER) DEVI USE AS DIRECTED 09/02/21   Roma Kayser, MD  Continuous Blood Gluc Sensor (FREESTYLE LIBRE 2 SENSOR) MISC APPLY 1 PATCH TO SKIN ONCE EVERY 14 DAYS 03/01/22   Roma Kayser, MD  docusate sodium (COLACE) 100 MG capsule Take 100 mg by mouth daily.    [provider]  DULERA 200-5 MCG/ACT AERO Inhale 2 puffs into the lungs 2 (two) times daily. 10/15/19   Johnson, Clanford L, MD  EUTHYROX 200 MCG tablet Take 200 mcg by mouth at bedtime. 05/13/20   [provider]  Evolocumab (REPATHA SURECLICK) 140 MG/ML SOAJ Inject 1 pen into the skin every 14 (fourteen) days. 12/24/19   Antoine Poche, MD  ezetimibe (ZETIA) 10 MG tablet Take 1 tablet (10 mg total) by mouth daily. 10/15/19   Johnson, Clanford L, MD  famotidine (PEPCID) 40 MG tablet Take 40 mg by mouth daily. Patient not taking: Reported on 02/15/2022 11/23/20   [provider]  fenofibrate (TRICOR) 145 MG tablet Take 1 tablet (145 mg total) by mouth daily. 10/15/19   Johnson, Clanford L, MD  fluticasone (FLONASE) 50 MCG/ACT nasal spray Place 2 sprays into both nostrils 2 (two) times daily. 06/24/20   [provider]  furosemide (LASIX) 20 MG tablet Take 1 tablet (20 mg total) by mouth daily as needed. 10/19/21   Antoine Poche, MD  gabapentin (NEURONTIN) 300 MG capsule One po q AM, one po q PM and 2 po qHS Patient taking differently: Take 300 mg by mouth 2 (two) times daily. 12/23/19   Sater, Pearletha Furl, MD  glipiZIDE (GLUCOTROL XL) 10 MG 24 hr tablet Take 1 tablet (10 mg total) by mouth daily. 06/15/21   Roma Kayser, MD  glucose blood (ACCU-CHEK GUIDE) test strip Test glucose 2 times a day 08/10/20   Roma Kayser, MD  icosapent Ethyl (VASCEPA) 1 g capsule Take 2 capsules  by mouth twice daily 02/06/22   Antoine Poche, MD  Insulin Lispro Prot & Lispro (HUMALOG MIX 75/25 KWIKPEN) (75-25) 100 UNIT/ML Kwikpen Inject 100 Units into the skin 2 (two) times daily with a meal. 07/13/21   Nida, Denman George, MD  Insulin Pen Needle (B-D ULTRAFINE III SHORT PEN) 31G X 8 MM MISC Use as directed to inject insulin 2 times daily. 05/02/21   Roma Kayser, MD  levothyroxine (SYNTHROID) 88 MCG tablet Take 88 mcg by mouth daily. 01/25/21   [provider]  nitroGLYCERIN (NITROSTAT) 0.4 MG SL tablet Place 1 tablet (0.4 mg total) under the tongue every 5 (five) minutes  as needed for chest pain. 02/14/21   Antoine Poche, MD  ondansetron (ZOFRAN-ODT) 4 MG disintegrating tablet Take 1 tablet (4 mg total) by mouth every 8 (eight) hours as needed for nausea or vomiting. 06/22/21   Sabas Sous, MD  pantoprazole (PROTONIX) 40 MG tablet Take 1 tablet (40 mg total) by mouth daily. 10/19/21   Antoine Poche, MD  sitaGLIPtin (JANUVIA) 50 MG tablet Take 1 tablet (50 mg total) by mouth daily. 06/15/21   Roma Kayser, MD  VENTOLIN HFA 108 (90 Base) MCG/ACT inhaler Inhale into the lungs. 11/23/20   [provider]     Family History  Problem Relation Age of Onset   Coronary artery disease Father    Hypertension Father    Hyperlipidemia Father    Diabetes Father    Congestive Heart Failure Father 53   Arrhythmia Father        had an ICD   Diabetes Mother    Hypertension Mother    Hyperlipidemia Mother    Obesity Mother 39       died after bariatric surgery, liver failure and infection   Coronary artery disease Maternal Grandfather        both grandfathers and several uncles   Coronary artery disease Other    Diabetes Sister        both sisters   Stroke Maternal Aunt    Stroke Maternal Uncle     Social History   Socioeconomic History   Marital status: Divorced    Spouse name: Not on file   Number of children: Not on file   Years of  education: Not on file   Highest education level: Not on file  Occupational History   Occupation: Disability     Comment:      Employer: [employer]  Tobacco Use   Smoking status: Every Day    Packs/day: 1.00    Years: 26.00    Total pack years: 26.00    Types: Cigarettes    Start date: 03/13/1985    Passive exposure: Never   Smokeless tobacco: Former    Types: Snuff, Chew    Quit date: 03/14/1987  Vaping Use   Vaping Use: Former   Devices: quit in 2011  Substance and Sexual Activity   Alcohol use: No   Drug use: No   Sexual activity: Not on file  Other Topics Concern   Not on file  Social History Narrative   Eats fast food daily   No exercise outside of work   Lives in [place] with his dog   MAINTENANCE TECHNICIAN   Caffeine use: tea/soda daily   Left handed    Social Determinants of Health   Financial Resource Strain: Not on file  Food Insecurity: Not on file  Transportation Needs: Not on file  Physical Activity: Not on file  Stress: Not on file  Social Connections: Not on file     Review of Systems: A 12 point ROS discussed and pertinent positives are indicated in the HPI above.  All other systems are negative.  Review of Systems  Vital Signs: There were no vitals taken for this visit.  Physical Exam Vitals reviewed.  Constitutional:      Appearance: Normal appearance.  HENT:     Head: Normocephalic and atraumatic.  Eyes:     Extraocular Movements: Extraocular movements intact.  Cardiovascular:     Rate and Rhythm: Normal rate and regular rhythm.  Pulmonary:     Effort: Pulmonary  effort is normal. No respiratory distress.     Breath sounds: Normal breath sounds.  Abdominal:     General: There is no distension.     Palpations: Abdomen is soft.     Tenderness: There is no abdominal tenderness.  Musculoskeletal:        General: Normal range of motion.     Cervical back: Normal range of motion.     Comments: Left side weaker than the right  Skin:     General: Skin is warm and dry.  Neurological:     General: No focal deficit present.     Mental Status: He is alert and oriented to person, place, and time.  Psychiatric:        Mood and Affect: Mood normal.        Behavior: Behavior normal.        Thought Content: Thought content normal.        Judgment: Judgment normal.     Imaging: No results found.  Labs:  CBC: Recent Labs    09/16/21 0955 10/15/21 1656 10/15/21 1703 10/16/21 0417 10/17/21 0648  WBC 8.6 12.6*  --  9.5 7.0  HGB 17.0 15.9 17.3* 13.2 13.5  HCT 50.8 46.6 51.0 38.3* 39.8  PLT 266 298  --  216 221    COAGS: Recent Labs    10/15/21 1656  INR 1.0    BMP: Recent Labs    10/15/21 1656 10/15/21 1703 10/15/21 2318 10/16/21 0417 10/17/21 0648  NA 124* 127* 131* 135 137  K 4.6 4.9 3.9 4.0 3.7  CL 90* 94* 99 107 105  CO2 22  --  23 17* 23  GLUCOSE 692* >700* 301* 334* 241*  BUN 16 16 11 11 10   CALCIUM 9.8  --  8.8* 8.3* 9.0  CREATININE 1.51* 1.40* 1.00 0.96 0.83  GFRNONAA 56*  --  >60 >60 >60    LIVER FUNCTION TESTS: Recent Labs    06/21/21 1914 09/16/21 0955 10/15/21 1656 10/15/21 2318 10/16/21 0417  BILITOT 0.2* 0.4 0.5  --  0.9  AST 40 26 42*  --  24  ALT 68* 46* 71*  --  42  ALKPHOS 109 166* 137*  --  97  PROT 7.3 8.2* 8.0  --  5.3*  ALBUMIN 3.8 4.0 4.1 3.4* 2.8*    TUMOR MARKERS: No results for input(s): "AFPTM", "CEA", "CA199", "CHROMGRNA" in the last 8760 hours.  Assessment and Plan:  Visual changes and vertigo with severe stenosis in the proximal aspect of the vertebrobasilar stent.  Will proceed with diagnostic cerebral angiography today by Dr. 12/16/21.  Risks and benefits of cerebral angiogram with intervention were discussed with the patient including, but not limited to bleeding, infection, vascular injury, contrast induced renal failure, stroke or even death.  This interventional procedure involves the use of X-rays and because of the nature of the planned  procedure, it is possible that we will have prolonged use of X-ray fluoroscopy.  Potential radiation risks to you include (but are not limited to) the following: - A slightly elevated risk for cancer  several years later in life. This risk is typically less than 0.5% percent. This risk is low in comparison to the normal incidence of human cancer, which is 33% for women and 50% for men according to the American Cancer Society. - Radiation induced injury can include skin redness, resembling a rash, tissue breakdown / ulcers and hair loss (which can be temporary or permanent).   The likelihood  of either of these occurring depends on the difficulty of the procedure and whether you are sensitive to radiation due to previous procedures, disease, or genetic conditions.   IF your procedure requires a prolonged use of radiation, you will be notified and given written instructions for further action.  It is your responsibility to monitor the irradiated area for the 2 weeks following the procedure and to notify your physician if you are concerned that you have suffered a radiation induced injury.    All of the patient's questions were answered, patient is agreeable to proceed.  Consent signed and in chart.  Thank you for allowing our service to participate in Devin [name] 's care.  Electronically Signed: Murrell Redden, PA-C   03/16/2022, 2:28 PM      I spent a total of    25 Minutes in face to face in clinical consultation, greater than 50% of which was counseling/coordinating care for cerebral angiography.

## 2022-03-17 ENCOUNTER — Encounter (HOSPITAL_COMMUNITY): Payer: Self-pay

## 2022-03-17 ENCOUNTER — Ambulatory Visit (HOSPITAL_COMMUNITY)
Admission: RE | Admit: 2022-03-17 | Discharge: 2022-03-17 | Disposition: A | Discharge status: Home / Self Care | Payer: 59 | Source: Ambulatory Visit | Attending: Interventional Radiology | Admitting: Interventional Radiology

## 2022-03-17 DIAGNOSIS — I771 Stricture of artery: Secondary | ICD-10-CM

## 2022-03-17 DIAGNOSIS — H9319 Tinnitus, unspecified ear: Secondary | ICD-10-CM

## 2022-03-17 LAB — GLUCOSE, CAPILLARY: Glucose-Capillary: 574 mg/dL (ref 70–99)

## 2022-03-17 MED ORDER — SODIUM CHLORIDE 0.9 % IV SOLN: Freq: Once | INTRAVENOUS | Status: DC

## 2022-03-23 ENCOUNTER — Ambulatory Visit: Payer: Medicare Other | Admitting: Cardiology

## 2022-03-27 ENCOUNTER — Other Ambulatory Visit: Payer: Self-pay | Admitting: "Endocrinology

## 2022-04-04 ENCOUNTER — Other Ambulatory Visit: Payer: Self-pay

## 2022-04-04 ENCOUNTER — Emergency Department (HOSPITAL_COMMUNITY): Payer: 59

## 2022-04-04 ENCOUNTER — Emergency Department (HOSPITAL_COMMUNITY)
Admission: EM | Admit: 2022-04-04 | Discharge: 2022-04-04 | Disposition: A | Discharge status: Home / Self Care | Payer: 59 | Attending: Emergency Medicine | Admitting: Emergency Medicine

## 2022-04-04 ENCOUNTER — Encounter (HOSPITAL_COMMUNITY): Payer: Self-pay | Admitting: Emergency Medicine

## 2022-04-04 DIAGNOSIS — I10 Essential (primary) hypertension: Secondary | ICD-10-CM | POA: Insufficient documentation

## 2022-04-04 DIAGNOSIS — Z1152 Encounter for screening for COVID-19: Secondary | ICD-10-CM | POA: Insufficient documentation

## 2022-04-04 DIAGNOSIS — E860 Dehydration: Secondary | ICD-10-CM | POA: Diagnosis not present

## 2022-04-04 DIAGNOSIS — Z7902 Long term (current) use of antithrombotics/antiplatelets: Secondary | ICD-10-CM | POA: Diagnosis not present

## 2022-04-04 DIAGNOSIS — Z7982 Long term (current) use of aspirin: Secondary | ICD-10-CM | POA: Diagnosis not present

## 2022-04-04 DIAGNOSIS — Z8673 Personal history of transient ischemic attack (TIA), and cerebral infarction without residual deficits: Secondary | ICD-10-CM | POA: Diagnosis not present

## 2022-04-04 DIAGNOSIS — R0602 Shortness of breath: Secondary | ICD-10-CM | POA: Diagnosis present

## 2022-04-04 DIAGNOSIS — J441 Chronic obstructive pulmonary disease with (acute) exacerbation: Secondary | ICD-10-CM | POA: Insufficient documentation

## 2022-04-04 DIAGNOSIS — Z7984 Long term (current) use of oral hypoglycemic drugs: Secondary | ICD-10-CM | POA: Diagnosis not present

## 2022-04-04 DIAGNOSIS — F17210 Nicotine dependence, cigarettes, uncomplicated: Secondary | ICD-10-CM | POA: Diagnosis not present

## 2022-04-04 DIAGNOSIS — Z794 Long term (current) use of insulin: Secondary | ICD-10-CM | POA: Insufficient documentation

## 2022-04-04 DIAGNOSIS — E119 Type 2 diabetes mellitus without complications: Secondary | ICD-10-CM | POA: Diagnosis not present

## 2022-04-04 LAB — COMPREHENSIVE METABOLIC PANEL
ALT: 40 U/L (ref 0–44)
AST: 23 U/L (ref 15–41)
Albumin: 4.1 g/dL (ref 3.5–5.0)
Alkaline Phosphatase: 102 U/L (ref 38–126)
Anion gap: 12 (ref 5–15)
BUN: 23 mg/dL — ABNORMAL HIGH (ref 6–20)
CO2: 23 mmol/L (ref 22–32)
Calcium: 9.5 mg/dL (ref 8.9–10.3)
Chloride: 94 mmol/L — ABNORMAL LOW (ref 98–111)
Creatinine, Ser: 1.29 mg/dL — ABNORMAL HIGH (ref 0.61–1.24)
GFR, Estimated: >60 mL/min (ref >60)
Glucose, Bld: 487 mg/dL — ABNORMAL HIGH (ref 70–99)
Potassium: 4 mmol/L (ref 3.5–5.1)
Sodium: 129 mmol/L — ABNORMAL LOW (ref 135–145)
Total Bilirubin: 0.6 mg/dL (ref 0.3–1.2)
Total Protein: 8.1 g/dL (ref 6.5–8.1)

## 2022-04-04 LAB — URINALYSIS, ROUTINE W REFLEX MICROSCOPIC
Bacteria, UA: NONE SEEN
Bilirubin Urine: NEGATIVE
Glucose, UA: >=500 mg/dL — AB
Hgb urine dipstick: NEGATIVE
Ketones, ur: 5 mg/dL — AB
Leukocytes,Ua: NEGATIVE
Nitrite: NEGATIVE
Protein, ur: NEGATIVE mg/dL
Specific Gravity, Urine: 1.03 (ref 1.005–1.030)
pH: 6 (ref 5.0–8.0)

## 2022-04-04 LAB — CBC WITH DIFFERENTIAL/PLATELET
Abs Immature Granulocytes: 0.03 10*3/uL (ref 0.00–0.07)
Basophils Absolute: 0.1 10*3/uL (ref 0.0–0.1)
Basophils Relative: 1 %
Eosinophils Absolute: 0.3 10*3/uL (ref 0.0–0.5)
Eosinophils Relative: 3 %
HCT: 45.2 % (ref 39.0–52.0)
Hemoglobin: 15.1 g/dL (ref 13.0–17.0)
Immature Granulocytes: 0 %
Lymphocytes Relative: 32 %
Lymphs Abs: 2.9 10*3/uL (ref 0.7–4.0)
MCH: 29.2 pg (ref 26.0–34.0)
MCHC: 33.4 g/dL (ref 30.0–36.0)
MCV: 87.3 fL (ref 80.0–100.0)
Monocytes Absolute: 0.4 10*3/uL (ref 0.1–1.0)
Monocytes Relative: 4 %
Neutro Abs: 5.3 10*3/uL (ref 1.7–7.7)
Neutrophils Relative %: 60 %
Platelets: 299 10*3/uL (ref 150–400)
RBC: 5.18 MIL/uL (ref 4.22–5.81)
RDW: 14.1 % (ref 11.5–15.5)
WBC: 8.9 10*3/uL (ref 4.0–10.5)
nRBC: 0 % (ref 0.0–0.2)

## 2022-04-04 LAB — D-DIMER, QUANTITATIVE: D-Dimer, Quant: 0.31 ug/mL-FEU (ref 0.00–0.50)

## 2022-04-04 LAB — RESP PANEL BY RT-PCR (RSV, FLU A&B, COVID)  RVPGX2
Influenza A by PCR: NEGATIVE
Influenza B by PCR: NEGATIVE
Resp Syncytial Virus by PCR: NEGATIVE
SARS Coronavirus 2 by RT PCR: NEGATIVE

## 2022-04-04 LAB — LACTIC ACID, PLASMA
Lactic Acid, Venous: 1.4 mmol/L (ref 0.5–1.9)
Lactic Acid, Venous: 1.4 mmol/L (ref 0.5–1.9)

## 2022-04-04 LAB — PROTIME-INR
INR: 0.9 (ref 0.8–1.2)
Prothrombin Time: 12.4 seconds (ref 11.4–15.2)

## 2022-04-04 MED ORDER — ALBUTEROL (5 MG/ML) CONTINUOUS INHALATION SOLN
10.0000 mg/h | INHALATION_SOLUTION | RESPIRATORY_TRACT | Status: AC
  Administered 2022-04-04: 10 mg/h via RESPIRATORY_TRACT
  Filled 2022-04-04: qty 20

## 2022-04-04 MED ORDER — DOXYCYCLINE HYCLATE 100 MG PO CAPS: 100.0000 mg | ORAL_CAPSULE | Freq: Two times a day (BID) | ORAL | 0 refills | Status: AC

## 2022-04-04 MED ORDER — DOXYCYCLINE HYCLATE 100 MG PO TABS
100.0000 mg | ORAL_TABLET | Freq: Once | ORAL | Status: AC
  Administered 2022-04-04: 100 mg via ORAL
  Filled 2022-04-04: qty 1

## 2022-04-04 MED ORDER — KETOROLAC TROMETHAMINE 15 MG/ML IJ SOLN
15.0000 mg | Freq: Once | INTRAMUSCULAR | Status: AC
  Administered 2022-04-04: 15 mg via INTRAVENOUS
  Filled 2022-04-04: qty 1

## 2022-04-04 MED ORDER — SODIUM CHLORIDE 0.9 % IV BOLUS
1000.0000 mL | Freq: Once | INTRAVENOUS | Status: AC
  Administered 2022-04-04: 1000 mL via INTRAVENOUS

## 2022-04-04 MED ORDER — DEXAMETHASONE SODIUM PHOSPHATE 10 MG/ML IJ SOLN
10.0000 mg | Freq: Once | INTRAMUSCULAR | Status: AC
  Administered 2022-04-04: 10 mg via INTRAVENOUS
  Filled 2022-04-04: qty 1

## 2022-04-04 MED ORDER — ACETAMINOPHEN 325 MG PO TABS
650.0000 mg | ORAL_TABLET | Freq: Once | ORAL | Status: AC
  Administered 2022-04-04: 650 mg via ORAL
  Filled 2022-04-04: qty 2

## 2022-04-04 NOTE — ED Notes (Addendum)
Pulse Ox while walking all the way around the nurse's desk got to 98% RA and the highest pulse was 123. Pt did very well

## 2022-04-04 NOTE — Discharge Instructions (Signed)
Use your albuterol inhaler 2 puffs every 4 hours.  If you needed significantly more than this or if you develop coughing up blood, high fever, new or worsening shortness of breath, or any other new/concerning symptoms then return to the ER or call 911.

## 2022-04-04 NOTE — ED Provider Notes (Signed)
[place] EMERGENCY DEPARTMENT AT [facility] Provider Note   CSN: [id number] Arrival date & time: 04/04/22  1028     History  Chief Complaint  Patient presents with   Shortness of Breath    Devin [name] is a 52 y.o. male.  HPI Multiple medical problems, including prior stroke, prior MI, ongoing smoking addiction presents with dyspnea, fatigue.  He notes that his blood sugar typically runs in the 400s, and this has been consistent.  Over the past days he has had dyspnea.  EMS reports patient saturation 92% on room air, improved with supplemental oxygen.  Patient denies focal pain, states that he always has some degree of discomfort.    Home Medications Prior to Admission medications   Medication Sig Start Date End Date Taking? Authorizing Provider  amoxicillin-clavulanate (AUGMENTIN) 875-125 MG tablet Take 1 tablet by mouth 2 (two) times daily. 03/28/22  Yes [provider]  OLUMIANT 4 MG TABS Take 1 tablet by mouth daily. 12/30/21  Yes [provider]  aspirin 81 MG EC tablet Take 1 tablet (81 mg total) by mouth daily. With food 12/05/17   Shon Hale, MD  clopidogrel (PLAVIX) 75 MG tablet Take 1 tablet (75 mg total) by mouth daily. 10/15/19   Cleora Fleet, MD  Continuous Blood Gluc Receiver (FREESTYLE LIBRE 2 READER) DEVI USE AS DIRECTED 09/02/21   Roma Kayser, MD  Continuous Blood Gluc Sensor (FREESTYLE LIBRE 2 SENSOR) MISC APPLY 1 PATCH TO SKIN ONCE EVERY 14 DAYS 03/27/22   Roma Kayser, MD  docusate sodium (COLACE) 100 MG capsule Take 100 mg by mouth daily.    [provider]  DULERA 200-5 MCG/ACT AERO Inhale 2 puffs into the lungs 2 (two) times daily. 10/15/19   Johnson, Clanford L, MD  EUTHYROX 200 MCG tablet Take 200 mcg by mouth at bedtime. 05/13/20   [provider]  Evolocumab (REPATHA SURECLICK) 140 MG/ML SOAJ Inject 1 pen into the skin every 14 (fourteen) days. 12/24/19   Antoine Poche, MD   ezetimibe (ZETIA) 10 MG tablet Take 1 tablet (10 mg total) by mouth daily. 10/15/19   Johnson, Clanford L, MD  famotidine (PEPCID) 40 MG tablet Take 40 mg by mouth daily. Patient not taking: Reported on 02/15/2022 11/23/20   [provider]  fenofibrate (TRICOR) 145 MG tablet Take 1 tablet (145 mg total) by mouth daily. 10/15/19   Johnson, Clanford L, MD  fluticasone (FLONASE) 50 MCG/ACT nasal spray Place 2 sprays into both nostrils 2 (two) times daily. 06/24/20   [provider]  furosemide (LASIX) 20 MG tablet Take 1 tablet (20 mg total) by mouth daily as needed. 10/19/21   Antoine Poche, MD  gabapentin (NEURONTIN) 300 MG capsule One po q AM, one po q PM and 2 po qHS Patient taking differently: Take 300 mg by mouth 2 (two) times daily. 12/23/19   Sater, Pearletha Furl, MD  glipiZIDE (GLUCOTROL XL) 10 MG 24 hr tablet Take 1 tablet (10 mg total) by mouth daily. 06/15/21   Roma Kayser, MD  glucose blood (ACCU-CHEK GUIDE) test strip Test glucose 2 times a day 08/10/20   Roma Kayser, MD  icosapent Ethyl (VASCEPA) 1 g capsule Take 2 capsules by mouth twice daily 02/06/22   Antoine Poche, MD  Insulin Lispro Prot & Lispro (HUMALOG MIX 75/25 KWIKPEN) (75-25) 100 UNIT/ML Kwikpen Inject 100 Units into the skin 2 (two) times daily with a meal. 07/13/21  Cassandria Anger, MD  Insulin Pen Needle (B-D ULTRAFINE III SHORT PEN) 31G X 8 MM MISC Use as directed to inject insulin 2 times daily. 05/02/21   Cassandria Anger, MD  levothyroxine (SYNTHROID) 88 MCG tablet Take 88 mcg by mouth daily. 01/25/21   [provider]  nitroGLYCERIN (NITROSTAT) 0.4 MG SL tablet Place 1 tablet (0.4 mg total) under the tongue every 5 (five) minutes as needed for chest pain. 02/14/21   Arnoldo Lenis, MD  ondansetron (ZOFRAN-ODT) 4 MG disintegrating tablet Take 1 tablet (4 mg total) by mouth every 8 (eight) hours as needed for nausea or vomiting. 06/22/21   Maudie Flakes, MD   pantoprazole (PROTONIX) 40 MG tablet Take 1 tablet (40 mg total) by mouth daily. 10/19/21   Arnoldo Lenis, MD  sitaGLIPtin (JANUVIA) 50 MG tablet Take 1 tablet (50 mg total) by mouth daily. 06/15/21   Cassandria Anger, MD  VENTOLIN HFA 108 (90 Base) MCG/ACT inhaler Inhale into the lungs. 11/23/20   [provider]      Allergies    Crestor [rosuvastatin], Trulicity [dulaglutide], and Bee venom    Review of Systems   Review of Systems  All other systems reviewed and are negative.   Physical Exam Updated Vital Signs BP (!) 134/93   Pulse 91   Temp 98.1 F (36.7 C) (Oral)   Resp 20   SpO2 100%  Physical Exam Vitals and nursing note reviewed.  Constitutional:      General: He is not in acute distress.    Appearance: He is well-developed. He is obese. He is ill-appearing. He is not diaphoretic.  HENT:     Head: Normocephalic and atraumatic.  Eyes:     Conjunctiva/sclera: Conjunctivae normal.  Cardiovascular:     Rate and Rhythm: Regular rhythm. Tachycardia present.  Pulmonary:     Effort: Pulmonary effort is normal. No respiratory distress.     Breath sounds: No stridor. Decreased breath sounds present. No wheezing.  Abdominal:     General: There is no distension.  Skin:    General: Skin is warm and dry.  Neurological:     Mental Status: He is alert and oriented to person, place, and time.     ED Results / Procedures / Treatments   Labs (all labs ordered are listed, but only abnormal results are displayed) Labs Reviewed  URINALYSIS, ROUTINE W REFLEX MICROSCOPIC - Abnormal; Notable for the following components:      Result Value   Color, Urine STRAW (*)    Glucose, UA >=500 (*)    Ketones, ur 5 (*)    All other components within normal limits  COMPREHENSIVE METABOLIC PANEL - Abnormal; Notable for the following components:   Sodium 129 (*)    Chloride 94 (*)    Glucose, Bld 487 (*)    BUN 23 (*)    Creatinine, Ser 1.29 (*)    All other components  within normal limits  CULTURE, BLOOD (ROUTINE X 2)  CULTURE, BLOOD (ROUTINE X 2)  RESP PANEL BY RT-PCR (RSV, FLU A&B, COVID)  RVPGX2  LACTIC ACID, PLASMA  LACTIC ACID, PLASMA  CBC WITH DIFFERENTIAL/PLATELET  D-DIMER, QUANTITATIVE (NOT AT [facility])  PROTIME-INR    EKG EKG Interpretation  Date/Time:  Tuesday April 04 2022 10:41:20 EST Ventricular Rate:  93 PR Interval:  150 QRS Duration: 111 QT Interval:  359 QTC Calculation: 447 R Axis:   89 Text Interpretation: Sinus rhythm Minimal ST depression, inferior leads  Abnormal ECG Confirmed by Carmin Muskrat [phone]) on 04/04/2022 11:04:34 AM  Radiology DG Chest Port 1 View  Result Date: 04/04/2022 CLINICAL DATA:  Shortness of breath this morning. EXAM: PORTABLE CHEST 1 VIEW COMPARISON:  01/05/2022 FINDINGS: Lungs are well inflated. Heart size is normal. There are no focal consolidations or pleural effusions. There is moderate midthoracic spondylosis. IMPRESSION: No active disease. Electronically Signed   By: Nolon Nations M.D.   On: 04/04/2022 11:26    Procedures Procedures    Medications Ordered in ED Medications  acetaminophen (TYLENOL) tablet 650 mg (has no administration in time range)  sodium chloride 0.9 % bolus 1,000 mL (has no administration in time range)  albuterol (PROVENTIL,VENTOLIN) solution continuous neb (has no administration in time range)  dexamethasone (DECADRON) injection 10 mg (has no administration in time range)    ED Course/ Medical Decision Making/ A&P                             Medical Decision Making Patient with multiple medical issues including MI, hypertension, diabetes, cigarette addiction presents with dyspnea, fatigue, generalized discomfort. Differential including bacteremia, sepsis, viral syndrome, ACS, pneumonia, PE all considered.  Labs CT x-ray ordered  Amount and/or Complexity of Data Reviewed Independent Historian: EMS    Details: HPI External Data Reviewed: notes.    Details:  Ongoing efforts for diabetes control Labs: ordered. Decision-making details documented in ED Course. Radiology: ordered and independent interpretation performed. Decision-making details documented in ED Course. ECG/medicine tests: ordered and independent interpretation performed. Decision-making details documented in ED Course.  Risk OTC drugs. Prescription drug management. Decision regarding hospitalization.  Cardiac monitor 95 sinus normal Pulse ox 96% with 4 L nasal cannula abnormal  3:25 PM Patient markedly better though he continues to receive 3 L via nasal cannula.  Lactic acid level normal, dimer normal, some suspicion for COPD exacerbation with evidence for dehydration with creatinine greater than his most recent value. Patient is mentating appropriately remains afebrile, continue to receive resuscitation including continuous albuterol and IV fluids.  Should he improve considerably he may be appropriate for discharge, but will require repeat evaluation after further resuscitation for likely COPD exacerbation plus minus dehydration.  Dr. Regenia Skeeter aware  Final Clinical Impression(s) / ED Diagnoses Final diagnoses:  COPD exacerbation ([place])  Dehydration    Rx / DC Orders ED Discharge Orders     None         Carmin Muskrat, MD 04/04/22 1526

## 2022-04-04 NOTE — ED Notes (Signed)
Pt had removed BP cuff, so it was reapplied.

## 2022-04-04 NOTE — ED Notes (Signed)
Will check temp after RT treatment is over.

## 2022-04-04 NOTE — ED Triage Notes (Signed)
Pt woke with sob this am. Pt arrived with some gasping. 02 sats room air 92%. Pt placed on 03 2L and sats are around 93%. 02 up to 4L  BS 473 in route, per pt that is his normal. Some stridor/upper airway noted at times. Lungs sounds moving air but congestion noted. Pt coughing but states no more than usual. Color wnl. Non diaphoretic. Edp in at 1039

## 2022-04-04 NOTE — ED Provider Notes (Signed)
4:48 PM Care transferred to me.  Patient is feeling a lot better after the continuous albuterol which she just came off of.  I do not hear any significant wheezing though he does have some crackles in the right lower lobe.  Will start him on doxycycline as he is currently on Augmentin for a bad tooth.  He is complaining of a headache which he thinks is from coughing and started while he was here.  Will give some Toradol.  He will need observation in the ED to make sure he does not become hypoxic as he is now off the oxygen.  If he does well he may be able to go home.  5:58 PM Patient continues to feel well from a respiratory perspective.  O2 sats in the mid to high 90s.  Will ambulate with pulse ox and if he does well he feels well going home.  Counseled on the increasing glucose that will come with the Decadron given.  Will also prescribe doxycycline to continue his Augmentin with.  6:28 PM Ambulated and felt good. O2 sats were good per nurse. Became tachycardic but felt ok.  I think this is not unreasonable to be this tachycardic after an hour of albuterol and his dyspnea.  Given he feels well enough for discharge we will send home with doxycycline and he has plenty of albuterol.  He was given Decadron and so I do not think he needs a prescription for prednisone.  Will discharge home with return precautions.   Sherwood Gambler, MD 04/04/22 [phone]

## 2022-04-04 NOTE — ED Notes (Signed)
Edp aware pt met suspected infection orders

## 2022-04-09 LAB — CULTURE, BLOOD (ROUTINE X 2)
Culture: NO GROWTH
Culture: NO GROWTH
Special Requests: ADEQUATE

## 2022-06-11 ENCOUNTER — Other Ambulatory Visit: Payer: Self-pay | Admitting: "Endocrinology

## 2022-06-27 ENCOUNTER — Other Ambulatory Visit: Payer: Self-pay | Admitting: Urology

## 2022-06-27 DIAGNOSIS — D4102 Neoplasm of uncertain behavior of left kidney: Secondary | ICD-10-CM

## 2022-07-05 ENCOUNTER — Other Ambulatory Visit: Payer: Self-pay | Admitting: "Endocrinology

## 2022-07-06 ENCOUNTER — Ambulatory Visit: Payer: Medicare Other | Admitting: Cardiology

## 2022-07-06 NOTE — Progress Notes (Deleted)
Clinical Summary Devin [name] is a 52 y.o.maleseen today for follow up of the following medical problems.        1. CAD/Chest pain/Chronic systolic HF - admitted 11/2017 with chest pain concerning for unstable angina.  11/2017 coronary CTA with severe prox LAD disease and possible mid LAD disease and D1.   11/2017 cath LM patent, LAD mid 80 to 90%, LCX distal 90%, OM2 90%, LPDA 90%, RCA small with prox 99% LVgram 35-45% (no echo on file). DES x 2 to LAD, DES to OM,  trifurcation lesion distal LCX, LPL and PDA recs for medical therapy, poor pci target. RCA small vessel treated medically.    Interventional cards recommended extended DAPT    01/2018 echo: LVEF 35-40%, grade I diastoluc dysfunction  - bradycardia on beta blocketers. History of rhabdo on crestor. Low bp and dizzienss on entresto.       10/2018 nuclear stress: no clear ischemia. LVEF 46%  10/2019 echo  LVEF 50-55%, no WMAs, indet diastolic fxn, nomral RV     Jan 2023 ER visit with right sided chest pain - trop neg x 2. EKG without ischemic changes. CXR no acute proces   -no chest pains, chronic SOB unchanged. Variable SOB, some chronic cough.  - no recent edema      2. COPD - abnormal PFTs 02/2019 - on dulera, followed by pcp      3. Hyperlipidemia - history of rhabdo on crestor - previously extraordinarily high TGs in setting of poorly controlled DM2, hypothyroidism  -followed in lipid clinic. On repatha, zetia, fenofirbate, vascepa 12/2019 TC 235 TG 321 HDL 25 LDL 150. Direct LDL 143     06/2021 TC 232 TG 730 HDL 26 LDL 86 10/15/21 HgbA1c 14.7   4. History of CVA - history of CVA 11/2017 - had underwent an image-guided diagnostic cerebral angiogram 12/01/2017 with Dr. 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 He then underwent an image-guided cerebral angiogram with revascularization of his vertebrobasilar junction/proximal basilar artery stenosis  using stent assisted angioplasty 12/03/2017 by Dr. Corliss Skains - later had ISR requiring repeat procedure 02/2018       4. COVID admission 10/2021       SH: has 2 boxer dogs at home, 11 and 3. Oldest dog passed recently, now just has one boxer   Past Medical History:  Diagnosis Date   Asthma    Chest pain    CHF (congestive heart failure) (HCC)    CKD (chronic kidney disease) 11/13/2017   Coronary artery disease    a. s/p DES x2 to LAD and DES to OM in 11/2017   Depression    Dyspnea    GERD (gastroesophageal reflux disease)    Headache    History of kidney stones    History of rhabdomyolysis    Hyperlipidemia    Hypertension    Hypothyroidism    Ischemic cardiomyopathy    a. 11/2017: echo showing EF of 35-40%, diffuse HK, and Grade 2 DD   Kidney calculus 2014   Pericardial effusion    Small, by dobutamine echocardiogram, 04/2006   PONV (postoperative nausea and vomiting)    Sleep apnea    does not wear cpap   Stroke (HCC)    Tobacco abuse    Type 2 diabetes mellitus without complications (HCC) 10/07/2013   Vitamin D deficiency      Allergies  Allergen Reactions   Crestor [Rosuvastatin] Other (See Comments)  rhabdomyolosis   Trulicity [Dulaglutide] Nausea And Vomiting   Bee Venom Swelling    SWELLING REACTION UNSPECIFIED      Current Outpatient Medications  Medication Sig Dispense Refill   amoxicillin-clavulanate (AUGMENTIN) 875-125 MG tablet Take 1 tablet by mouth 2 (two) times daily.     aspirin 81 MG EC tablet Take 1 tablet (81 mg total) by mouth daily. With food 30 tablet 0   clopidogrel (PLAVIX) 75 MG tablet Take 1 tablet (75 mg total) by mouth daily. 30 tablet 1   Continuous Blood Gluc Receiver (FREESTYLE LIBRE 2 READER) DEVI USE AS DIRECTED 1 each 0   Continuous Blood Gluc Sensor (FREESTYLE LIBRE 2 SENSOR) MISC APPLY 1 PATCH TO SKIN ONCE EVERY 14 DAYS 2 each 2   docusate sodium (COLACE) 100 MG capsule Take 100 mg by mouth daily.     DULERA 200-5  MCG/ACT AERO Inhale 2 puffs into the lungs 2 (two) times daily. 8.8 g 1   EUTHYROX 200 MCG tablet Take 200 mcg by mouth at bedtime.     Evolocumab (REPATHA SURECLICK) 140 MG/ML SOAJ Inject 1 pen into the skin every 14 (fourteen) days. 1 mL 11   ezetimibe (ZETIA) 10 MG tablet Take 1 tablet (10 mg total) by mouth daily. 30 tablet 1   famotidine (PEPCID) 40 MG tablet Take 40 mg by mouth daily.     fenofibrate (TRICOR) 145 MG tablet Take 1 tablet (145 mg total) by mouth daily. 30 tablet 1   fluticasone (FLONASE) 50 MCG/ACT nasal spray Place 2 sprays into both nostrils 2 (two) times daily.     furosemide (LASIX) 20 MG tablet Take 1 tablet (20 mg total) by mouth daily as needed. 30 tablet 6   gabapentin (NEURONTIN) 300 MG capsule One po q AM, one po q PM and 2 po qHS (Patient taking differently: Take 300 mg by mouth 2 (two) times daily.) 120 capsule 11   glipiZIDE (GLUCOTROL XL) 10 MG 24 hr tablet Take 1 tablet (10 mg total) by mouth daily. 90 tablet 1   glucose blood (ACCU-CHEK GUIDE) test strip Test glucose 2 times a day 100 each 2   icosapent Ethyl (VASCEPA) 1 g capsule Take 2 capsules by mouth twice daily 360 capsule 0   Insulin Lispro Prot & Lispro (HUMALOG MIX 75/25 KWIKPEN) (75-25) 100 UNIT/ML Kwikpen Inject 100 Units into the skin 2 (two) times daily with a meal. 60 mL 2   Insulin Pen Needle (B-D ULTRAFINE III SHORT PEN) 31G X 8 MM MISC Use as directed to inject insulin 2 times daily. 200 each 1   levothyroxine (SYNTHROID) 88 MCG tablet Take 88 mcg by mouth daily.     nitroGLYCERIN (NITROSTAT) 0.4 MG SL tablet Place 1 tablet (0.4 mg total) under the tongue every 5 (five) minutes as needed for chest pain. 25 tablet 3   OLUMIANT 4 MG TABS Take 1 tablet by mouth daily. (Patient not taking: Reported on 04/04/2022)     pantoprazole (PROTONIX) 40 MG tablet Take 1 tablet (40 mg total) by mouth daily. (Patient not taking: Reported on 04/04/2022) 30 tablet 6   sitaGLIPtin (JANUVIA) 50 MG tablet Take 1  tablet (50 mg total) by mouth daily. 90 tablet 1   VENTOLIN HFA 108 (90 Base) MCG/ACT inhaler Inhale into the lungs.     No current facility-administered medications for this visit.     Past Surgical History:  Procedure Laterality Date   APPENDECTOMY     CARDIAC CATHETERIZATION  09/2012   "  Nonobstructive CAD with 30% proximal, 40% mid LAD disease; 30% proximal CFX; EF 55-65%"   CHOLECYSTECTOMY N/A 01/27/2013   Procedure: LAPAROSCOPIC CHOLECYSTECTOMY;  Surgeon: Dalia Heading, MD;  Location: [facility];  Service: General;  Laterality: N/A;   CORONARY STENT INTERVENTION N/A 11/15/2017   Procedure: CORONARY STENT INTERVENTION;  Surgeon: Marykay Lex, MD;  Location: [facility] INVASIVE CV LAB;  Service: Cardiovascular;  Laterality: N/A;   HERNIA REPAIR     As a child   INSERTION OF MESH N/A 11/13/2012   Procedure: INSERTION OF MESH;  Surgeon: Dalia Heading, MD;  Location: [facility];  Service: General;  Laterality: N/A;   IR ANGIO INTRA EXTRACRAN SEL COM CAROTID INNOMINATE BILAT MOD SED  12/01/2017   IR ANGIO INTRA EXTRACRAN SEL COM CAROTID INNOMINATE BILAT MOD SED  02/13/2018   IR ANGIO VERTEBRAL SEL SUBCLAVIAN INNOMINATE UNI L MOD SED  02/13/2018   IR ANGIO VERTEBRAL SEL VERTEBRAL BILAT MOD SED  12/01/2017   IR ANGIO VERTEBRAL SEL VERTEBRAL UNI R MOD SED  02/13/2018   IR INTRA CRAN STENT  12/03/2017   IR PTA INTRACRANIAL  02/25/2018   IR RADIOLOGIST EVAL & MGMT  07/29/2021   IR US GUIDE VASC ACCESS RIGHT  02/13/2018   KIDNEY STONE SURGERY     LEFT HEART CATH AND CORONARY ANGIOGRAPHY N/A 11/15/2017   Procedure: LEFT HEART CATH AND CORONARY ANGIOGRAPHY;  Surgeon: Marykay Lex, MD;  Location: [facility] INVASIVE CV LAB;  Service: Cardiovascular;  Laterality: N/A;   PERCUTANEOUS NEPHROLITHOTRIPSY     RADIOLOGY WITH ANESTHESIA Left 12/03/2017   Procedure: Angioplasty with possible stenting of left VBJ;  Surgeon: Julieanne Cotton, MD;  Location: [facility] OR;  Service: Radiology;  Laterality: Left;   RADIOLOGY WITH  ANESTHESIA N/A 02/25/2018   Procedure: STENT PLACEMENT;  Surgeon: Julieanne Cotton, MD;  Location: [facility] OR;  Service: Radiology;  Laterality: N/A;   UMBILICAL HERNIA REPAIR N/A 11/13/2012   Procedure: UMBILICAL HERNIORRHAPHY;  Surgeon: Dalia Heading, MD;  Location: [facility];  Service: General;  Laterality: N/A;   VARICOCELECTOMY       Allergies  Allergen Reactions   Crestor [Rosuvastatin] Other (See Comments)    rhabdomyolosis   Trulicity [Dulaglutide] Nausea And Vomiting   Bee Venom Swelling    SWELLING REACTION UNSPECIFIED       Family History  Problem Relation Age of Onset   Coronary artery disease Father    Hypertension Father    Hyperlipidemia Father    Diabetes Father    Congestive Heart Failure Father 50   Arrhythmia Father        had an ICD   Diabetes Mother    Hypertension Mother    Hyperlipidemia Mother    Obesity Mother 105       died after bariatric surgery, liver failure and infection   Coronary artery disease Maternal Grandfather        both grandfathers and several uncles   Coronary artery disease Other    Diabetes Sister        both sisters   Stroke Maternal Aunt    Stroke Maternal Uncle      Social History Mr. [name] reports that he has been smoking cigarettes. He started smoking about 37 years ago. He has a 26.00 pack-year smoking history. He has never been exposed to tobacco smoke. He quit smokeless tobacco use about 35 years ago.  His smokeless tobacco use included snuff and chew. Mr. Climer reports no history of alcohol use.  Review of Systems CONSTITUTIONAL: No weight loss, fever, chills, weakness or fatigue.  HEENT: Eyes: No visual loss, blurred vision, double vision or yellow sclerae.No hearing loss, sneezing, congestion, runny nose or sore throat.  SKIN: No rash or itching.  CARDIOVASCULAR:  RESPIRATORY: No shortness of breath, cough or sputum.  GASTROINTESTINAL: No anorexia, nausea, vomiting or diarrhea. No abdominal pain or blood.   GENITOURINARY: No burning on urination, no polyuria NEUROLOGICAL: No headache, dizziness, syncope, paralysis, ataxia, numbness or tingling in the extremities. No change in bowel or bladder control.  MUSCULOSKELETAL: No muscle, back pain, joint pain or stiffness.  LYMPHATICS: No enlarged nodes. No history of splenectomy.  PSYCHIATRIC: No history of depression or anxiety.  ENDOCRINOLOGIC: No reports of sweating, cold or heat intolerance. No polyuria or polydipsia.  Marland Kitchen   Physical Examination There were no vitals filed for this visit. There were no vitals filed for this visit.  Gen: resting comfortably, no acute distress HEENT: no scleral icterus, pupils equal round and reactive, no palptable cervical adenopathy,  CV Resp: Clear to auscultation bilaterally GI: abdomen is soft, non-tender, non-distended, normal bowel sounds, no hepatosplenomegaly MSK: extremities are warm, no edema.  Skin: warm, no rash Neuro:  no focal deficits Psych: appropriate affect   Diagnostic Studies  11/2017 cath There is moderate left ventricular systolic dysfunction. The left ventricular ejection fraction is 35-45% by visual estimate. LV end diastolic pressure is mildly elevated. ----------------------------------------- ANGIOGRAPHY- PCI LESION #1: Mid LAD lesion is 80% stenosed. A drug-eluting stent was successfully placed using a STENT SYNERGY DES 2.25X16. -Postdilated to 2.4 mm Post intervention, there is a 0% residual stenosis. LESION #2: mid LAD to Dist LAD lesion is 90% stenosed. A drug-eluting stent was successfully placed using a STENT SYNERGY DES 2.25X12. -Postdilated to 2.4 mm Post intervention, there is a 0% residual stenosis. Prox LAD lesion is 40% stenosed -prior to lesion 1; apical, after lesion 2 A drug-eluting stent was successfully placed using a STENT SYNERGY DES 2.25X12. -Postdilated to 2.4 mm ----------------------------------------- Post intervention, there is a 0% residual  stenosis. Trifurcation 90% lesion at the distal circumflex into LPL and PDA -small caliber distal vessels. Best treated medically as this is not a very good PCI target Prox small caliber nondominant RCA lesion is 99% stenosed.   Severe distal OM 2 -DES PCI with Synergy 2.25 mm x16 mm (2.4 mm); severe trifurcation disease with a very distal AV groove circumflex prior to PDA and PL Zared Knoth -medical management.   Subtotally occluded nondominant RCA Moderately reduced LVEF of roughly 40% with mildly elevated LVEDP.   Plan: Transfer to postprocedure unit for post PCI monitoring and TR removal Continue aggressive risk factor modification with statin and beta-blocker etc.   Recommend uninterrupted dual antiplatelet therapy with Aspirin 81mg  daily and Ticagrelor 90mg  twice daily for a minimum of 12 months (ACS - Class I recommendation). -Would strongly consider reducing to 60 mg dose ticagrelor at 9-12 months and continue for an additional year.   Would be okay to stop aspirin after 3 months if necessary.  Okay to stop Brilinta after 6 months for procedures if necessary.     10/2018 nuclear stress There was no ST segment deviation noted during stress. Defect 1: There is a small defect of mild severity present in the mid inferoseptal location. This is likely due to soft tissue attenuation artifact. This is a low risk study. No ischemic zones. Nuclear stress EF: 46     8/2021echo IMPRESSIONS     1. Left  ventricular ejection fraction, by estimation, is 50 to 55%. The  left ventricle has low normal function. The left ventricle has no regional  wall motion abnormalities. There is moderate left ventricular hypertrophy.  Left ventricular diastolic  parameters are indeterminate.   2. Right ventricular systolic function is normal. The right ventricular  size is normal.   3. The mitral valve is normal in structure. No evidence of mitral valve  regurgitation. No evidence of mitral stenosis.   4. The  aortic valve is tricuspid. Aortic valve regurgitation is not  visualized. No aortic stenosis is present.   5. The inferior vena cava is normal in size with greater than 50%  respiratory variability, suggesting right atrial pressure of 3 mmHg.        08/2020 echo   NORMAL LEFT VENTRICULAR FUNCTION WITH MILD LVH    NORMAL LA PRESSURES WITH DIASTOLIC DYSFUNCTION    NORMAL RIGHT VENTRICULAR SYSTOLIC FUNCTION    VALVULAR REGURGITATION: TRIVIAL MR, TRIVIAL TR    NO VALVULAR STENOSIS   Assessment and Plan   1. CAD/ICM/SOB -LVEF has normalized - no recent symptoms, continue current meds   2. Hyperlipidemia - does not tolerate statins, on repatha LDL down to 86 - TGs remain very high due to HgbA1c of 14, continue to follow with endocrine. Continue vascepa and fenofibrate. Treatment would be getting A1c down further, no other TG specific medication options already on dual therapy     Antoine Poche, M.D., F.A.C.C.

## 2022-08-02 ENCOUNTER — Other Ambulatory Visit (HOSPITAL_COMMUNITY): Payer: Self-pay

## 2022-08-18 ENCOUNTER — Emergency Department (HOSPITAL_COMMUNITY): Payer: Medicare HMO

## 2022-08-18 ENCOUNTER — Emergency Department (HOSPITAL_COMMUNITY)
Admission: EM | Admit: 2022-08-18 | Discharge: 2022-08-18 | Disposition: A | Discharge status: Home / Self Care | Payer: Medicare HMO | Attending: Emergency Medicine | Admitting: Emergency Medicine

## 2022-08-18 ENCOUNTER — Other Ambulatory Visit: Payer: Self-pay

## 2022-08-18 ENCOUNTER — Encounter (HOSPITAL_COMMUNITY): Payer: Self-pay

## 2022-08-18 DIAGNOSIS — Z7902 Long term (current) use of antithrombotics/antiplatelets: Secondary | ICD-10-CM | POA: Insufficient documentation

## 2022-08-18 DIAGNOSIS — Z7951 Long term (current) use of inhaled steroids: Secondary | ICD-10-CM | POA: Diagnosis not present

## 2022-08-18 DIAGNOSIS — J449 Chronic obstructive pulmonary disease, unspecified: Secondary | ICD-10-CM | POA: Insufficient documentation

## 2022-08-18 DIAGNOSIS — Z7984 Long term (current) use of oral hypoglycemic drugs: Secondary | ICD-10-CM | POA: Diagnosis not present

## 2022-08-18 DIAGNOSIS — Z794 Long term (current) use of insulin: Secondary | ICD-10-CM | POA: Diagnosis not present

## 2022-08-18 DIAGNOSIS — E1122 Type 2 diabetes mellitus with diabetic chronic kidney disease: Secondary | ICD-10-CM | POA: Diagnosis not present

## 2022-08-18 DIAGNOSIS — I509 Heart failure, unspecified: Secondary | ICD-10-CM | POA: Diagnosis not present

## 2022-08-18 DIAGNOSIS — R0789 Other chest pain: Secondary | ICD-10-CM | POA: Diagnosis not present

## 2022-08-18 DIAGNOSIS — Z8673 Personal history of transient ischemic attack (TIA), and cerebral infarction without residual deficits: Secondary | ICD-10-CM | POA: Diagnosis not present

## 2022-08-18 DIAGNOSIS — I2510 Atherosclerotic heart disease of native coronary artery without angina pectoris: Secondary | ICD-10-CM | POA: Insufficient documentation

## 2022-08-18 DIAGNOSIS — Z79899 Other long term (current) drug therapy: Secondary | ICD-10-CM | POA: Insufficient documentation

## 2022-08-18 DIAGNOSIS — R29818 Other symptoms and signs involving the nervous system: Secondary | ICD-10-CM | POA: Diagnosis not present

## 2022-08-18 DIAGNOSIS — R42 Dizziness and giddiness: Secondary | ICD-10-CM | POA: Insufficient documentation

## 2022-08-18 DIAGNOSIS — E039 Hypothyroidism, unspecified: Secondary | ICD-10-CM | POA: Insufficient documentation

## 2022-08-18 DIAGNOSIS — R079 Chest pain, unspecified: Secondary | ICD-10-CM | POA: Diagnosis not present

## 2022-08-18 DIAGNOSIS — N189 Chronic kidney disease, unspecified: Secondary | ICD-10-CM | POA: Diagnosis not present

## 2022-08-18 DIAGNOSIS — I130 Hypertensive heart and chronic kidney disease with heart failure and stage 1 through stage 4 chronic kidney disease, or unspecified chronic kidney disease: Secondary | ICD-10-CM | POA: Insufficient documentation

## 2022-08-18 DIAGNOSIS — Z7982 Long term (current) use of aspirin: Secondary | ICD-10-CM | POA: Diagnosis not present

## 2022-08-18 LAB — CBC
HCT: 51.8 % (ref 39.0–52.0)
Hemoglobin: 17.2 g/dL — ABNORMAL HIGH (ref 13.0–17.0)
MCH: 28.5 pg (ref 26.0–34.0)
MCHC: 33.2 g/dL (ref 30.0–36.0)
MCV: 85.8 fL (ref 80.0–100.0)
Platelets: 278 10*3/uL (ref 150–400)
RBC: 6.04 MIL/uL — ABNORMAL HIGH (ref 4.22–5.81)
RDW: 13.5 % (ref 11.5–15.5)
WBC: 11.1 10*3/uL — ABNORMAL HIGH (ref 4.0–10.5)
nRBC: 0 % (ref 0.0–0.2)

## 2022-08-18 LAB — TROPONIN I (HIGH SENSITIVITY)
Troponin I (High Sensitivity): 5 ng/L (ref <18)
Troponin I (High Sensitivity): 5 ng/L (ref <18)

## 2022-08-18 LAB — BASIC METABOLIC PANEL
Anion gap: 14 (ref 5–15)
BUN: 18 mg/dL (ref 6–20)
CO2: 20 mmol/L — ABNORMAL LOW (ref 22–32)
Calcium: 9.6 mg/dL (ref 8.9–10.3)
Chloride: 97 mmol/L — ABNORMAL LOW (ref 98–111)
Creatinine, Ser: 0.93 mg/dL (ref 0.61–1.24)
GFR, Estimated: >60 mL/min (ref >60)
Glucose, Bld: 381 mg/dL — ABNORMAL HIGH (ref 70–99)
Potassium: 4.1 mmol/L (ref 3.5–5.1)
Sodium: 131 mmol/L — ABNORMAL LOW (ref 135–145)

## 2022-08-18 MED ORDER — IOHEXOL 350 MG/ML SOLN
75.0000 mL | Freq: Once | INTRAVENOUS | Status: AC | PRN
  Administered 2022-08-18: 75 mL via INTRAVENOUS

## 2022-08-18 MED ORDER — INSULIN ASPART 100 UNIT/ML IJ SOLN
8.0000 [IU] | Freq: Once | INTRAMUSCULAR | Status: AC
  Administered 2022-08-18: 8 [IU] via INTRAVENOUS
  Filled 2022-08-18: qty 1

## 2022-08-18 NOTE — ED Provider Notes (Signed)
[place] EMERGENCY DEPARTMENT AT [facility] Provider Note   CSN: [id number] Arrival date & time: 08/18/22  1303     History  Chief Complaint  Patient presents with   Chest Pain    Devin [name] is a 52 y.o. male.   Chest Pain Patient presented with chest pain and dizziness.  Has had chest pain for the last 2 days.  Has been constant.  Dull.  At times will wax and wane a little bit but is always been present.  Previous MI states does remember what that felt like.  Also had dizziness.  Has had for the last few years but worse last few days.  Has known for triple stenosis and known carotid stenosis.  Is on Plavix.  States he has episodes over the last couple days where he cannot even stand up.  States it feels that the room will spin around.  States he was supposed to get his carotid opened up but they did not do it because his sugar was too high.    Past Medical History:  Diagnosis Date   Asthma    Chest pain    CHF (congestive heart failure) (HCC)    CKD (chronic kidney disease) 11/13/2017   Coronary artery disease    a. s/p DES x2 to LAD and DES to OM in 11/2017   Depression    Dyspnea    GERD (gastroesophageal reflux disease)    Headache    History of kidney stones    History of rhabdomyolysis    Hyperlipidemia    Hypertension    Hypothyroidism    Ischemic cardiomyopathy    a. 11/2017: echo showing EF of 35-40%, diffuse HK, and Grade 2 DD   Kidney calculus 2014   Pericardial effusion    Small, by dobutamine echocardiogram, 04/2006   PONV (postoperative nausea and vomiting)    Sleep apnea    does not wear cpap   Stroke (HCC)    Tobacco abuse    Type 2 diabetes mellitus without complications (HCC) 10/07/2013   Vitamin D deficiency     Home Medications Prior to Admission medications   Medication Sig Start Date End Date Taking? Authorizing Provider  amoxicillin-clavulanate (AUGMENTIN) 875-125 MG tablet Take 1 tablet by mouth 2 (two) times daily.  03/28/22   [provider]  aspirin 81 MG EC tablet Take 1 tablet (81 mg total) by mouth daily. With food 12/05/17   Shon Hale, MD  clopidogrel (PLAVIX) 75 MG tablet Take 1 tablet (75 mg total) by mouth daily. 10/15/19   Cleora Fleet, MD  Continuous Blood Gluc Receiver (FREESTYLE LIBRE 2 READER) DEVI USE AS DIRECTED 09/02/21   Roma Kayser, MD  Continuous Blood Gluc Sensor (FREESTYLE LIBRE 2 SENSOR) MISC APPLY 1 PATCH TO SKIN ONCE EVERY 14 DAYS 03/27/22   Roma Kayser, MD  docusate sodium (COLACE) 100 MG capsule Take 100 mg by mouth daily.    [provider]  DULERA 200-5 MCG/ACT AERO Inhale 2 puffs into the lungs 2 (two) times daily. 10/15/19   Johnson, Clanford L, MD  EUTHYROX 200 MCG tablet Take 200 mcg by mouth at bedtime. 05/13/20   [provider]  Evolocumab (REPATHA SURECLICK) 140 MG/ML SOAJ Inject 1 pen into the skin every 14 (fourteen) days. 12/24/19   Antoine Poche, MD  ezetimibe (ZETIA) 10 MG tablet Take 1 tablet (10 mg total) by mouth daily. 10/15/19   Cleora Fleet, MD  famotidine (PEPCID)  40 MG tablet Take 40 mg by mouth daily. 11/23/20   [provider]  fenofibrate (TRICOR) 145 MG tablet Take 1 tablet (145 mg total) by mouth daily. 10/15/19   Johnson, Clanford L, MD  fluticasone (FLONASE) 50 MCG/ACT nasal spray Place 2 sprays into both nostrils 2 (two) times daily. 06/24/20   [provider]  furosemide (LASIX) 20 MG tablet Take 1 tablet (20 mg total) by mouth daily as needed. 10/19/21   Antoine Poche, MD  gabapentin (NEURONTIN) 300 MG capsule One po q AM, one po q PM and 2 po qHS Patient taking differently: Take 300 mg by mouth 2 (two) times daily. 12/23/19   Sater, Pearletha Furl, MD  glipiZIDE (GLUCOTROL XL) 10 MG 24 hr tablet Take 1 tablet (10 mg total) by mouth daily. 06/15/21   Roma Kayser, MD  glucose blood (ACCU-CHEK GUIDE) test strip Test glucose 2 times a day 08/10/20   Roma Kayser, MD   icosapent Ethyl (VASCEPA) 1 g capsule Take 2 capsules by mouth twice daily 02/06/22   Antoine Poche, MD  Insulin Lispro Prot & Lispro (HUMALOG MIX 75/25 KWIKPEN) (75-25) 100 UNIT/ML Kwikpen Inject 100 Units into the skin 2 (two) times daily with a meal. 07/13/21   Nida, Denman George, MD  Insulin Pen Needle (B-D ULTRAFINE III SHORT PEN) 31G X 8 MM MISC Use as directed to inject insulin 2 times daily. 05/02/21   Roma Kayser, MD  levothyroxine (SYNTHROID) 88 MCG tablet Take 88 mcg by mouth daily. 01/25/21   [provider]  nitroGLYCERIN (NITROSTAT) 0.4 MG SL tablet Place 1 tablet (0.4 mg total) under the tongue every 5 (five) minutes as needed for chest pain. 02/14/21   Antoine Poche, MD  OLUMIANT 4 MG TABS Take 1 tablet by mouth daily. Patient not taking: Reported on 04/04/2022 12/30/21   [provider]  pantoprazole (PROTONIX) 40 MG tablet Take 1 tablet (40 mg total) by mouth daily. Patient not taking: Reported on 04/04/2022 10/19/21   Antoine Poche, MD  sitaGLIPtin (JANUVIA) 50 MG tablet Take 1 tablet (50 mg total) by mouth daily. 06/15/21   Roma Kayser, MD  VENTOLIN HFA 108 (90 Base) MCG/ACT inhaler Inhale into the lungs. 11/23/20   [provider]      Allergies    Crestor [rosuvastatin], Trulicity [dulaglutide], and Bee venom    Review of Systems   Review of Systems  Cardiovascular:  Positive for chest pain.    Physical Exam Updated Vital Signs BP 122/79   Pulse 80   Temp 98.3 F (36.8 C) (Oral)   Resp (!) 23   Ht 6\' 6"  (1.981 m)   Wt 117.9 kg   SpO2 99%   BMI 30.04 kg/m  Physical Exam Vitals and nursing note reviewed.  Cardiovascular:     Rate and Rhythm: Regular rhythm.  Pulmonary:     Breath sounds: No wheezing or rhonchi.  Chest:     Chest wall: No tenderness.  Musculoskeletal:     Right lower leg: No edema.     Left lower leg: No edema.  Neurological:     Mental Status: He is alert.     Comments:  Finger-nose intact bilaterally.     ED Results / Procedures / Treatments   Labs (all labs ordered are listed, but only abnormal results are displayed) Labs Reviewed  BASIC METABOLIC PANEL - Abnormal; Notable for the following components:      Result Value  Sodium 131 (*)    Chloride 97 (*)    CO2 20 (*)    Glucose, Bld 381 (*)    All other components within normal limits  CBC - Abnormal; Notable for the following components:   WBC 11.1 (*)    RBC 6.04 (*)    Hemoglobin 17.2 (*)    All other components within normal limits  TROPONIN I (HIGH SENSITIVITY)  TROPONIN I (HIGH SENSITIVITY)    EKG EKG Interpretation  Date/Time:  Friday August 18 2022 13:11:04 EDT Ventricular Rate:  105 PR Interval:  140 QRS Duration: 94 QT Interval:  356 QTC Calculation: 470 R Axis:   102 Text Interpretation: Sinus tachycardia Rightward axis Nonspecific ST abnormality Abnormal ECG When compared with ECG of 04-Apr-2022 10:41, No significant change since last tracing Confirmed by Meridee Score [phone]) on 08/18/2022 1:14:33 PM  Radiology CT ANGIO HEAD NECK W WO CM  Result Date: 08/18/2022 CLINICAL DATA:  Neuro deficit, acute, stroke suspected. EXAM: CT ANGIOGRAPHY HEAD AND NECK WITH AND WITHOUT CONTRAST TECHNIQUE: Multidetector CT imaging of the head and neck was performed using the standard protocol during bolus administration of intravenous contrast. Multiplanar CT image reconstructions and MIPs were obtained to evaluate the vascular anatomy. Carotid stenosis measurements (when applicable) are obtained utilizing NASCET criteria, using the distal internal carotid diameter as the denominator. RADIATION DOSE REDUCTION: This exam was performed according to the departmental dose-optimization program which includes automated exposure control, adjustment of the mA and/or kV according to patient size and/or use of iterative reconstruction technique. CONTRAST:  75mL OMNIPAQUE IOHEXOL 350 MG/ML SOLN COMPARISON:   Brain MRI 08/18/2022.  CTA head/neck 01/06/2022. FINDINGS:.: FINDINGS:. CT HEAD FINDINGS Brain: No acute intracranial hemorrhage. 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 Gray-white differentiation is preserved. No hydrocephalus or extra-axial collection. No mass effect or midline shift. Vascular: Vertebrobasilar stent. Skull: No calvarial fracture or suspicious bone lesion. Skull base is unremarkable. Sinuses/Orbits: Unremarkable. Other: None. Review of the MIP images confirms the above findings CTA NECK FINDINGS Aortic arch: Three-vessel arch configuration. Arch vessel origins are incompletely imaged. Right carotid system: No evidence of dissection, stenosis (50% or greater), or occlusion. Left carotid system: Unchanged moderate stenosis of the distal left common carotid artery. Vertebral arteries: Codominant. No evidence of dissection, stenosis (50% or greater), or occlusion in the neck. Skeleton: Unremarkable. Other neck: Unremarkable. Upper chest: Paraseptal emphysema in the lung apices. Review of the MIP images confirms the above findings CTA HEAD FINDINGS Anterior circulation: Intracranial ICAs are patent without stenosis or aneurysm. The proximal ACAs and MCAs are patent without stenosis or aneurysm. Distal branches are symmetric. Posterior circulation: Unchanged moderate stenosis in the proximal aspect of the left vertebrobasilar stent. The SCAs, AICAs and PICAs are patent proximally. The PCAs are patent proximally without stenosis or aneurysm. Distal branches are symmetric. Venous sinuses: As permitted by contrast timing, patent. Anatomic variants: None. Review of the MIP images confirms the above findings IMPRESSION: 1. No acute intracranial abnormality. 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 2. Unchanged moderate stenosis of the distal left common carotid artery. 3. Unchanged moderate stenosis in the proximal aspect of the left vertebrobasilar stent. 4. No intracranial large vessel occlusion or significant  stenosis. Emphysema (ICD10-J43.9). Electronically Signed   By: Orvan Falconer M.D.   On: 08/18/2022 16:31   MR BRAIN WO CONTRAST  Result Date: 08/18/2022 CLINICAL DATA:  Neuro deficit, acute, stroke suspected. EXAM: MRI HEAD WITHOUT CONTRAST TECHNIQUE: Multiplanar, multiecho pulse sequences of the brain and surrounding structures were obtained without  intravenous contrast. COMPARISON:  MRI brain 01/06/2022. FINDINGS: Brain: No acute infarct or hemorrhage. 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 No mass or midline shift. No hydrocephalus or extra-axial collection. No abnormal susceptibility. Vascular: Normal flow voids. Skull and upper cervical spine: Normal marrow signal. Sinuses/Orbits: Unremarkable. Other: None. IMPRESSION: No acute intracranial process. Electronically Signed   By: Orvan Falconer M.D.   On: 08/18/2022 16:00   DG Chest 2 View  Result Date: 08/18/2022 CLINICAL DATA:  Chest pain EXAM: CHEST - 2 VIEW COMPARISON:  Chest x-ray dated March 15, 2022 FINDINGS: The heart size and mediastinal contours are within normal limits. Both lungs are clear. The visualized skeletal structures are unremarkable. IMPRESSION: No active cardiopulmonary disease. Electronically Signed   By: Allegra Lai M.D.   On: 08/18/2022 13:45    Procedures Procedures    Medications Ordered in ED Medications  insulin aspart (novoLOG) injection 8 Units (8 Units Intravenous Given 08/18/22 1554)  iohexol (OMNIPAQUE) 350 MG/ML injection 75 mL (75 mLs Intravenous Contrast Given 08/18/22 1546)    ED Course/ Medical Decision Making/ A&P                             Medical Decision Making Amount and/or Complexity of Data Reviewed Labs: ordered. Radiology: ordered.  Risk Prescription drug management.   Patient with chest pain.  EKG reassuring.  History of coronary artery disease.  Troponin initially negative.  X-ray reassuring.  Differential diagnosis includes cardiac and noncardiac cause of the pain. Will  dizziness.  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  Potentially is the cause but also potential episodes of hypotension got worse in the vertigo due to the low floor state.  Will get CTA and MRI.  Reviewed neurology note from around 6 months ago.  MRI showed no acute stroke.  CTA stable although does have narrowing both anteriorly and posteriorly.  Potential had a hypertensive episode that caused some symptoms.  Will have follow-up with neurointerventional radiology.  Cardiac workup reassuring.  Appears stable for discharge home.        Final Clinical Impression(s) / ED Diagnoses Final diagnoses:  Nonspecific chest pain    Rx / DC Orders ED Discharge Orders     None         Benjiman Core, MD 08/18/22 1756

## 2022-08-18 NOTE — ED Notes (Signed)
Patient transported to MRI 

## 2022-08-18 NOTE — Discharge Instructions (Signed)
The workup for your chest pain was reassuring.  However you continue to have the narrowing in your neck vessels in both the front and the back.  Follow-up with neurointerventional radiology.

## 2022-08-18 NOTE — ED Triage Notes (Signed)
Pt presents from home with central CP for 2 days accompanied by headaches and "dizzy spells"  Pt states pain stays "right in the middle of his chest"  No increased swelling to LE.  Hx of COPD, strokes, and stents in the past. Takes plavix.

## 2022-08-18 NOTE — ED Notes (Signed)
Pt left before going over discharge paperwork. IV taken out by pt and noted to be in the trash can. ED charge RN notified.

## 2022-08-18 NOTE — ED Notes (Signed)
Pt given water at this time 

## 2022-08-19 ENCOUNTER — Other Ambulatory Visit: Payer: Self-pay | Admitting: Cardiology

## 2022-08-23 ENCOUNTER — Telehealth (HOSPITAL_COMMUNITY): Payer: Self-pay

## 2022-08-23 NOTE — Telephone Encounter (Signed)
Called to schedule angiogram, no answer, left vm. AB

## 2022-09-07 ENCOUNTER — Ambulatory Visit (HOSPITAL_COMMUNITY): Admission: RE | Admit: 2022-09-07 | Payer: Medicare HMO | Source: Ambulatory Visit

## 2022-09-12 NOTE — H&P (Signed)
Chief Complaint: Dizziness. Patient presents for cerebral angiogram  Supervising Physician: Julieanne Cotton  Patient Status: [facility] - Out-pt  History of Present Illness: Devin [name] is a 52 y.o. male outpatient. Smoker. History of MI, carotid stenosis, CHF, CAD, GERD. Presented to the ED on 6.7.24 with chest pain and dizziness. CT Angio head and neck from 6.7.24 reads  acute intracranial abnormality. 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 Unchanged moderate stenosis of the distal left common carotid artery. Unchanged moderate stenosis in the proximal aspect of the left vertebrobasilar stent. No intracranial large vessel occlusion or significant stenosis. Patient presents for diagnostic angiogram for further evaluation.  Currently without any significant complaints. Patient alert and laying in bed,calm. Denies any fevers, headache, chest pain, SOB, cough, abdominal pain, nausea, vomiting or bleeding. Return precautions and treatment recommendations and follow-up discussed with the patient and his sister at bedside.  Both who are agreeable with the plan.    Past Medical History:  Diagnosis Date   Asthma    Chest pain    CHF (congestive heart failure) (HCC)    CKD (chronic kidney disease) 11/13/2017   Coronary artery disease    a. s/p DES x2 to LAD and DES to OM in 11/2017   Depression    Dyspnea    GERD (gastroesophageal reflux disease)    Headache    History of kidney stones    History of rhabdomyolysis    Hyperlipidemia    Hypertension    Hypothyroidism    Ischemic cardiomyopathy    a. 11/2017: echo showing EF of 35-40%, diffuse HK, and Grade 2 DD   Kidney calculus 2014   Pericardial effusion    Small, by dobutamine echocardiogram, 04/2006   PONV (postoperative nausea and vomiting)    Sleep apnea    does not wear cpap   Stroke (HCC)    Tobacco abuse    Type 2 diabetes mellitus without complications (HCC) 10/07/2013   Vitamin D deficiency     Past Surgical  History:  Procedure Laterality Date   APPENDECTOMY     CARDIAC CATHETERIZATION  09/2012   "Nonobstructive CAD with 30% proximal, 40% mid LAD disease; 30% proximal CFX; EF 55-65%"   CHOLECYSTECTOMY N/A 01/27/2013   Procedure: LAPAROSCOPIC CHOLECYSTECTOMY;  Surgeon: Dalia Heading, MD;  Location: [facility] ORS;  Service: General;  Laterality: N/A;   CORONARY STENT INTERVENTION N/A 11/15/2017   Procedure: CORONARY STENT INTERVENTION;  Surgeon: Marykay Lex, MD;  Location: [facility] INVASIVE CV LAB;  Service: Cardiovascular;  Laterality: N/A;   HERNIA REPAIR     As a child   INSERTION OF MESH N/A 11/13/2012   Procedure: INSERTION OF MESH;  Surgeon: Dalia Heading, MD;  Location: [facility];  Service: General;  Laterality: N/A;   IR ANGIO INTRA EXTRACRAN SEL COM CAROTID INNOMINATE BILAT MOD SED  12/01/2017   IR ANGIO INTRA EXTRACRAN SEL COM CAROTID INNOMINATE BILAT MOD SED  02/13/2018   IR ANGIO VERTEBRAL SEL SUBCLAVIAN INNOMINATE UNI L MOD SED  02/13/2018   IR ANGIO VERTEBRAL SEL VERTEBRAL BILAT MOD SED  12/01/2017   IR ANGIO VERTEBRAL SEL VERTEBRAL UNI R MOD SED  02/13/2018   IR INTRA CRAN STENT  12/03/2017   IR PTA INTRACRANIAL  02/25/2018   IR RADIOLOGIST EVAL & MGMT  07/29/2021   IR US GUIDE VASC ACCESS RIGHT  02/13/2018   KIDNEY STONE SURGERY     LEFT HEART CATH AND CORONARY ANGIOGRAPHY N/A 11/15/2017   Procedure: LEFT HEART CATH AND  CORONARY ANGIOGRAPHY;  Surgeon: Marykay Lex, MD;  Location: [facility] INVASIVE CV LAB;  Service: Cardiovascular;  Laterality: N/A;   PERCUTANEOUS NEPHROLITHOTRIPSY     RADIOLOGY WITH ANESTHESIA Left 12/03/2017   Procedure: Angioplasty with possible stenting of left VBJ;  Surgeon: Julieanne Cotton, MD;  Location: [facility] OR;  Service: Radiology;  Laterality: Left;   RADIOLOGY WITH ANESTHESIA N/A 02/25/2018   Procedure: STENT PLACEMENT;  Surgeon: Julieanne Cotton, MD;  Location: [facility] OR;  Service: Radiology;  Laterality: N/A;   UMBILICAL HERNIA REPAIR N/A 11/13/2012   Procedure: UMBILICAL  HERNIORRHAPHY;  Surgeon: Dalia Heading, MD;  Location: [facility];  Service: General;  Laterality: N/A;   VARICOCELECTOMY      Allergies: Crestor [rosuvastatin], Trulicity [dulaglutide], and Bee venom  Medications: Prior to Admission medications   Medication Sig Start Date End Date Taking? Authorizing Provider  amoxicillin-clavulanate (AUGMENTIN) 875-125 MG tablet Take 1 tablet by mouth 2 (two) times daily. 03/28/22   [provider]  aspirin 81 MG EC tablet Take 1 tablet (81 mg total) by mouth daily. With food 12/05/17   Shon Hale, MD  clopidogrel (PLAVIX) 75 MG tablet Take 1 tablet (75 mg total) by mouth daily. 10/15/19   Cleora Fleet, MD  Continuous Blood Gluc Receiver (FREESTYLE LIBRE 2 READER) DEVI USE AS DIRECTED 09/02/21   Roma Kayser, MD  Continuous Blood Gluc Sensor (FREESTYLE LIBRE 2 SENSOR) MISC APPLY 1 PATCH TO SKIN ONCE EVERY 14 DAYS 03/27/22   Roma Kayser, MD  docusate sodium (COLACE) 100 MG capsule Take 100 mg by mouth daily.    [provider]  DULERA 200-5 MCG/ACT AERO Inhale 2 puffs into the lungs 2 (two) times daily. 10/15/19   Johnson, Clanford L, MD  EUTHYROX 200 MCG tablet Take 200 mcg by mouth at bedtime. 05/13/20   [provider]  Evolocumab (REPATHA SURECLICK) 140 MG/ML SOAJ Inject 1 pen into the skin every 14 (fourteen) days. 12/24/19   Antoine Poche, MD  ezetimibe (ZETIA) 10 MG tablet Take 1 tablet (10 mg total) by mouth daily. 10/15/19   Johnson, Clanford L, MD  famotidine (PEPCID) 40 MG tablet Take 40 mg by mouth daily. 11/23/20   [provider]  fenofibrate (TRICOR) 145 MG tablet Take 1 tablet (145 mg total) by mouth daily. 10/15/19   Johnson, Clanford L, MD  fluticasone (FLONASE) 50 MCG/ACT nasal spray Place 2 sprays into both nostrils 2 (two) times daily. 06/24/20   [provider]  furosemide (LASIX) 20 MG tablet Take 1 tablet (20 mg total) by mouth daily as needed. 10/19/21   Antoine Poche,  MD  gabapentin (NEURONTIN) 300 MG capsule One po q AM, one po q PM and 2 po qHS Patient taking differently: Take 300 mg by mouth 2 (two) times daily. 12/23/19   Sater, Pearletha Furl, MD  glipiZIDE (GLUCOTROL XL) 10 MG 24 hr tablet Take 1 tablet (10 mg total) by mouth daily. 06/15/21   Roma Kayser, MD  glucose blood (ACCU-CHEK GUIDE) test strip Test glucose 2 times a day 08/10/20   Roma Kayser, MD  icosapent Ethyl (VASCEPA) 1 g capsule Take 2 capsules by mouth twice daily 02/06/22   Antoine Poche, MD  Insulin Lispro Prot & Lispro (HUMALOG MIX 75/25 KWIKPEN) (75-25) 100 UNIT/ML Kwikpen Inject 100 Units into the skin 2 (two) times daily with a meal. 07/13/21   Nida, Denman George, MD  Insulin Pen Needle (B-D ULTRAFINE III SHORT PEN) 31G X  8 MM MISC Use as directed to inject insulin 2 times daily. 05/02/21   Roma Kayser, MD  levothyroxine (SYNTHROID) 88 MCG tablet Take 88 mcg by mouth daily. 01/25/21   [provider]  nitroGLYCERIN (NITROSTAT) 0.4 MG SL tablet Place 1 tablet (0.4 mg total) under the tongue every 5 (five) minutes as needed for chest pain. 02/14/21   Antoine Poche, MD  OLUMIANT 4 MG TABS Take 1 tablet by mouth daily. Patient not taking: Reported on 04/04/2022 12/30/21   [provider]  pantoprazole (PROTONIX) 40 MG tablet Take 1 tablet by mouth once daily 08/21/22   Antoine Poche, MD  sitaGLIPtin (JANUVIA) 50 MG tablet Take 1 tablet (50 mg total) by mouth daily. 06/15/21   Roma Kayser, MD  VENTOLIN HFA 108 (90 Base) MCG/ACT inhaler Inhale into the lungs. 11/23/20   [provider]     Family History  Problem Relation Age of Onset   Coronary artery disease Father    Hypertension Father    Hyperlipidemia Father    Diabetes Father    Congestive Heart Failure Father 82   Arrhythmia Father        had an ICD   Diabetes Mother    Hypertension Mother    Hyperlipidemia Mother    Obesity Mother 71       died after  bariatric surgery, liver failure and infection   Coronary artery disease Maternal Grandfather        both grandfathers and several uncles   Coronary artery disease Other    Diabetes Sister        both sisters   Stroke Maternal Aunt    Stroke Maternal Uncle     Social History   Socioeconomic History   Marital status: Divorced    Spouse name: Not on file   Number of children: Not on file   Years of education: Not on file   Highest education level: Not on file  Occupational History   Occupation: Disability     Comment:      Employer: [employer]  Tobacco Use   Smoking status: Every Day    Packs/day: 1.00    Years: 26.00    Additional pack years: 0.00    Total pack years: 26.00    Types: Cigarettes    Start date: 03/13/1985    Passive exposure: Never   Smokeless tobacco: Former    Types: Snuff, Chew    Quit date: 03/14/1987  Vaping Use   Vaping Use: Former   Devices: quit in 2011  Substance and Sexual Activity   Alcohol use: No   Drug use: No   Sexual activity: Not on file  Other Topics Concern   Not on file  Social History Narrative   Eats fast food daily   No exercise outside of work   Lives in [place] with his dog   MAINTENANCE TECHNICIAN   Caffeine use: tea/soda daily   Left handed    Social Determinants of Health   Financial Resource Strain: Not on file  Food Insecurity: Not on file  Transportation Needs: Not on file  Physical Activity: Not on file  Stress: Not on file  Social Connections: Not on file    Review of Systems: A 12 point ROS discussed and pertinent positives are indicated in the HPI above.  All other systems are negative.  Review of Systems  Constitutional:  Negative for fever.  HENT:  Negative for congestion.   Respiratory:  Negative  for cough and shortness of breath.   Cardiovascular:  Negative for chest pain.  Gastrointestinal:  Negative for abdominal pain.  Neurological:  Negative for headaches.  Psychiatric/Behavioral:  Negative for  behavioral problems and confusion.     Vital Signs: BP 137/79   Pulse 74   Temp 99 F (37.2 C) (Temporal)   Resp 18   Ht 6\' 6"  (1.981 m)   Wt 280 lb (127 kg)   SpO2 96%   BMI 32.36 kg/m     Physical Exam Vitals and nursing note reviewed.  Constitutional:      Appearance: He is well-developed.  HENT:     Head: Normocephalic.  Cardiovascular:     Rate and Rhythm: Normal rate and regular rhythm.     Heart sounds: Normal heart sounds.  Pulmonary:     Effort: Pulmonary effort is normal.     Breath sounds: Normal breath sounds.  Musculoskeletal:        General: Normal range of motion.     Cervical back: Normal range of motion.  Skin:    General: Skin is warm and dry.  Neurological:     General: No focal deficit present.     Mental Status: He is alert and oriented to person, place, and time.  Psychiatric:        Mood and Affect: Mood normal.        Behavior: Behavior normal.        Thought Content: Thought content normal.        Judgment: Judgment normal.     Imaging: CT ANGIO HEAD NECK W WO CM  Result Date: 08/18/2022 CLINICAL DATA:  Neuro deficit, acute, stroke suspected. EXAM: CT ANGIOGRAPHY HEAD AND NECK WITH AND WITHOUT CONTRAST TECHNIQUE: Multidetector CT imaging of the head and neck was performed using the standard protocol during bolus administration of intravenous contrast. Multiplanar CT image reconstructions and MIPs were obtained to evaluate the vascular anatomy. Carotid stenosis measurements (when applicable) are obtained utilizing NASCET criteria, using the distal internal carotid diameter as the denominator. RADIATION DOSE REDUCTION: This exam was performed according to the departmental dose-optimization program which includes automated exposure control, adjustment of the mA and/or kV according to patient size and/or use of iterative reconstruction technique. CONTRAST:  75mL OMNIPAQUE IOHEXOL 350 MG/ML SOLN COMPARISON:  Brain MRI 08/18/2022.  CTA head/neck  01/06/2022. FINDINGS:.: FINDINGS:. CT HEAD FINDINGS Brain: No acute intracranial hemorrhage. 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 Gray-white differentiation is preserved. No hydrocephalus or extra-axial collection. No mass effect or midline shift. Vascular: Vertebrobasilar stent. Skull: No calvarial fracture or suspicious bone lesion. Skull base is unremarkable. Sinuses/Orbits: Unremarkable. Other: None. Review of the MIP images confirms the above findings CTA NECK FINDINGS Aortic arch: Three-vessel arch configuration. Arch vessel origins are incompletely imaged. Right carotid system: No evidence of dissection, stenosis (50% or greater), or occlusion. Left carotid system: Unchanged moderate stenosis of the distal left common carotid artery. Vertebral arteries: Codominant. No evidence of dissection, stenosis (50% or greater), or occlusion in the neck. Skeleton: Unremarkable. Other neck: Unremarkable. Upper chest: Paraseptal emphysema in the lung apices. Review of the MIP images confirms the above findings CTA HEAD FINDINGS Anterior circulation: Intracranial ICAs are patent without stenosis or aneurysm. The proximal ACAs and MCAs are patent without stenosis or aneurysm. Distal branches are symmetric. Posterior circulation: Unchanged moderate stenosis in the proximal aspect of the left vertebrobasilar stent. The SCAs, AICAs and PICAs are patent proximally. The PCAs are patent proximally without stenosis or aneurysm. Distal  branches are symmetric. Venous sinuses: As permitted by contrast timing, patent. Anatomic variants: None. Review of the MIP images confirms the above findings IMPRESSION: 1. No acute intracranial abnormality. 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 2. Unchanged moderate stenosis of the distal left common carotid artery. 3. Unchanged moderate stenosis in the proximal aspect of the left vertebrobasilar stent. 4. No intracranial large vessel occlusion or significant stenosis. Emphysema (ICD10-J43.9).  Electronically Signed   By: Orvan Falconer M.D.   On: 08/18/2022 16:31   MR BRAIN WO CONTRAST  Result Date: 08/18/2022 CLINICAL DATA:  Neuro deficit, acute, stroke suspected. EXAM: MRI HEAD WITHOUT CONTRAST TECHNIQUE: Multiplanar, multiecho pulse sequences of the brain and surrounding structures were obtained without intravenous contrast. COMPARISON:  MRI brain 01/06/2022. FINDINGS: Brain: No acute infarct or hemorrhage. 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 No mass or midline shift. No hydrocephalus or extra-axial collection. No abnormal susceptibility. Vascular: Normal flow voids. Skull and upper cervical spine: Normal marrow signal. Sinuses/Orbits: Unremarkable. Other: None. IMPRESSION: No acute intracranial process. Electronically Signed   By: Orvan Falconer M.D.   On: 08/18/2022 16:00   DG Chest 2 View  Result Date: 08/18/2022 CLINICAL DATA:  Chest pain EXAM: CHEST - 2 VIEW COMPARISON:  Chest x-ray dated March 15, 2022 FINDINGS: The heart size and mediastinal contours are within normal limits. Both lungs are clear. The visualized skeletal structures are unremarkable. IMPRESSION: No active cardiopulmonary disease. Electronically Signed   By: Allegra Lai M.D.   On: 08/18/2022 13:45    Labs:  CBC: Recent Labs    10/17/21 0648 04/04/22 1115 08/18/22 1322 09/15/22 0721  WBC 7.0 8.9 11.1* 10.1  HGB 13.5 15.1 17.2* 14.5  HCT 39.8 45.2 51.8 43.6  PLT 221 299 278 266    COAGS: Recent Labs    10/15/21 1656 04/04/22 1254 09/15/22 0721  INR 1.0 0.9 1.0    BMP: Recent Labs    10/16/21 0417 10/17/21 0648 04/04/22 1254 08/18/22 1322  NA 135 137 129* 131*  K 4.0 3.7 4.0 4.1  CL 107 105 94* 97*  CO2 17* 23 23 20*  GLUCOSE 334* 241* 487* 381*  BUN 11 10 23* 18  CALCIUM 8.3* 9.0 9.5 9.6  CREATININE 0.96 0.83 1.29* 0.93  GFRNONAA >60 >60 >60 >60    LIVER FUNCTION TESTS: Recent Labs    09/16/21 0955 10/15/21 1656 10/15/21 2318 10/16/21 0417 04/04/22 1254  BILITOT  0.4 0.5  --  0.9 0.6  AST 26 42*  --  24 23  ALT 46* 71*  --  42 40  ALKPHOS 166* 137*  --  97 102  PROT 8.2* 8.0  --  5.3* 8.1  ALBUMIN 4.0 4.1 3.4* 2.8* 4.1   Assessment and Plan:  52 y.o. male outpatient. Smoker. History of MI, carotid stenosis, CHF, CAD, GERD. Presented to the ED on 6.7.24 with chest pain and dizziness. CT Angio head and neck from 6.7.24 reads  acute intracranial abnormality. 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 Unchanged moderate stenosis of the distal left common carotid artery. Unchanged moderate stenosis in the proximal aspect of the left vertebrobasilar stent. No intracranial large vessel occlusion or significant stenosis. Patient presents for diagnostic angiogram for further evaluation.   Patient is on plavix and ASA. CBG 318.  All other labs and medications are within acceptable parameters. No pertinent allergies. Patient has been NPO since midnight.   Risks and benefits of cerebral angiogram were discussed with the patient including, but not limited to bleeding, infection, vascular injury  or contrast induced renal failure.  This interventional procedure involves the use of X-rays and because of the nature of the planned procedure, it is possible that we will have prolonged use of X-ray fluoroscopy.  Potential radiation risks to you include (but are not limited to) the following: - A slightly elevated risk for cancer  several years later in life. This risk is typically less than 0.5% percent. This risk is low in comparison to the normal incidence of human cancer, which is 33% for women and 50% for men according to the [employer] Society. - Radiation induced injury can include skin redness, resembling a rash, tissue breakdown / ulcers and hair loss (which can be temporary or permanent).   The likelihood of either of these occurring depends on the difficulty of the procedure and whether you are sensitive to radiation due to previous procedures, disease, or  genetic conditions.   IF your procedure requires a prolonged use of radiation, you will be notified and given written instructions for further action.  It is your responsibility to monitor the irradiated area for the 2 weeks following the procedure and to notify your physician if you are concerned that you have suffered a radiation induced injury.    All of the patient's questions were answered, patient is agreeable to proceed.  Consent signed and in chart.     Thank you for this interesting consult.  I greatly enjoyed meeting Devin [name] and look forward to participating in their care.  A copy of this report was sent to the requesting provider on this date.  Electronically Signed: Alene Mires, NP 09/15/2022, 7:37 AM   I spent a total of  30 Minutes   in face to face in clinical consultation, greater than 50% of which was counseling/coordinating care for cerebral angiogram

## 2022-09-13 ENCOUNTER — Other Ambulatory Visit: Payer: Self-pay | Admitting: Physician Assistant

## 2022-09-13 DIAGNOSIS — Z01818 Encounter for other preprocedural examination: Secondary | ICD-10-CM

## 2022-09-15 ENCOUNTER — Other Ambulatory Visit (HOSPITAL_COMMUNITY): Payer: Self-pay | Admitting: Interventional Radiology

## 2022-09-15 ENCOUNTER — Ambulatory Visit (HOSPITAL_COMMUNITY)
Admission: RE | Admit: 2022-09-15 | Discharge: 2022-09-15 | Disposition: A | Discharge status: Home / Self Care | Payer: Medicare HMO | Source: Ambulatory Visit | Attending: Interventional Radiology | Admitting: Interventional Radiology

## 2022-09-15 DIAGNOSIS — Z01818 Encounter for other preprocedural examination: Secondary | ICD-10-CM

## 2022-09-15 DIAGNOSIS — Z7984 Long term (current) use of oral hypoglycemic drugs: Secondary | ICD-10-CM | POA: Diagnosis not present

## 2022-09-15 DIAGNOSIS — I2510 Atherosclerotic heart disease of native coronary artery without angina pectoris: Secondary | ICD-10-CM | POA: Diagnosis not present

## 2022-09-15 DIAGNOSIS — I6503 Occlusion and stenosis of bilateral vertebral arteries: Secondary | ICD-10-CM | POA: Diagnosis not present

## 2022-09-15 DIAGNOSIS — I771 Stricture of artery: Secondary | ICD-10-CM

## 2022-09-15 DIAGNOSIS — K219 Gastro-esophageal reflux disease without esophagitis: Secondary | ICD-10-CM | POA: Insufficient documentation

## 2022-09-15 DIAGNOSIS — E1122 Type 2 diabetes mellitus with diabetic chronic kidney disease: Secondary | ICD-10-CM | POA: Diagnosis not present

## 2022-09-15 DIAGNOSIS — N189 Chronic kidney disease, unspecified: Secondary | ICD-10-CM | POA: Insufficient documentation

## 2022-09-15 DIAGNOSIS — I6522 Occlusion and stenosis of left carotid artery: Secondary | ICD-10-CM | POA: Diagnosis not present

## 2022-09-15 DIAGNOSIS — I252 Old myocardial infarction: Secondary | ICD-10-CM | POA: Diagnosis not present

## 2022-09-15 DIAGNOSIS — Z794 Long term (current) use of insulin: Secondary | ICD-10-CM | POA: Diagnosis not present

## 2022-09-15 DIAGNOSIS — I509 Heart failure, unspecified: Secondary | ICD-10-CM | POA: Insufficient documentation

## 2022-09-15 DIAGNOSIS — I130 Hypertensive heart and chronic kidney disease with heart failure and stage 1 through stage 4 chronic kidney disease, or unspecified chronic kidney disease: Secondary | ICD-10-CM | POA: Diagnosis not present

## 2022-09-15 DIAGNOSIS — Z87891 Personal history of nicotine dependence: Secondary | ICD-10-CM | POA: Diagnosis not present

## 2022-09-15 HISTORY — PX: IR ANGIO VERTEBRAL SEL SUBCLAVIAN INNOMINATE BILAT MOD SED: IMG5366

## 2022-09-15 HISTORY — PX: IR ANGIO INTRA EXTRACRAN SEL COM CAROTID INNOMINATE BILAT MOD SED: IMG5360

## 2022-09-15 LAB — PROTIME-INR
INR: 1 (ref 0.8–1.2)
Prothrombin Time: 13.6 seconds (ref 11.4–15.2)

## 2022-09-15 LAB — CBC
HCT: 43.6 % (ref 39.0–52.0)
Hemoglobin: 14.5 g/dL (ref 13.0–17.0)
MCH: 28.3 pg (ref 26.0–34.0)
MCHC: 33.3 g/dL (ref 30.0–36.0)
MCV: 85.2 fL (ref 80.0–100.0)
Platelets: 266 10*3/uL (ref 150–400)
RBC: 5.12 MIL/uL (ref 4.22–5.81)
RDW: 13.6 % (ref 11.5–15.5)
WBC: 10.1 10*3/uL (ref 4.0–10.5)
nRBC: 0 % (ref 0.0–0.2)

## 2022-09-15 LAB — BASIC METABOLIC PANEL
Anion gap: 11 (ref 5–15)
BUN: 14 mg/dL (ref 6–20)
CO2: 24 mmol/L (ref 22–32)
Calcium: 9.2 mg/dL (ref 8.9–10.3)
Chloride: 98 mmol/L (ref 98–111)
Creatinine, Ser: 0.87 mg/dL (ref 0.61–1.24)
GFR, Estimated: >60 mL/min (ref >60)
Glucose, Bld: 333 mg/dL — ABNORMAL HIGH (ref 70–99)
Potassium: 3.6 mmol/L (ref 3.5–5.1)
Sodium: 133 mmol/L — ABNORMAL LOW (ref 135–145)

## 2022-09-15 LAB — GLUCOSE, CAPILLARY
Glucose-Capillary: 318 mg/dL — ABNORMAL HIGH (ref 70–99)
Glucose-Capillary: 303 mg/dL — ABNORMAL HIGH (ref 70–99)

## 2022-09-15 MED ORDER — LIDOCAINE HCL 1 % IJ SOLN
INTRAMUSCULAR | Status: AC
  Filled 2022-09-15: qty 20

## 2022-09-15 MED ORDER — SODIUM CHLORIDE 0.9 % IV SOLN: INTRAVENOUS | Status: DC

## 2022-09-15 MED ORDER — INSULIN ASPART 100 UNIT/ML IJ SOLN
5.0000 [IU] | Freq: Once | INTRAMUSCULAR | Status: AC
  Administered 2022-09-15: 5 [IU] via SUBCUTANEOUS

## 2022-09-15 MED ORDER — INSULIN ASPART 100 UNIT/ML IJ SOLN: 0.0000 [IU] | Freq: Three times a day (TID) | INTRAMUSCULAR | Status: DC

## 2022-09-15 MED ORDER — SODIUM CHLORIDE 0.9 % IV BOLUS
INTRAVENOUS | Status: AC | PRN
  Administered 2022-09-15: 250 mL via INTRAVENOUS

## 2022-09-15 MED ORDER — FENTANYL CITRATE (PF) 100 MCG/2ML IJ SOLN
INTRAMUSCULAR | Status: AC | PRN
  Administered 2022-09-15: 25 ug via INTRAVENOUS

## 2022-09-15 MED ORDER — CEFAZOLIN SODIUM-DEXTROSE 2-4 GM/100ML-% IV SOLN: 2.0000 g | INTRAVENOUS | Status: DC

## 2022-09-15 MED ORDER — HEPARIN SODIUM (PORCINE) 1000 UNIT/ML IJ SOLN
INTRAMUSCULAR | Status: AC
  Filled 2022-09-15: qty 10

## 2022-09-15 MED ORDER — MIDAZOLAM HCL 2 MG/2ML IJ SOLN
INTRAMUSCULAR | Status: AC
  Filled 2022-09-15: qty 2

## 2022-09-15 MED ORDER — FENTANYL CITRATE (PF) 100 MCG/2ML IJ SOLN
INTRAMUSCULAR | Status: AC
  Filled 2022-09-15: qty 2

## 2022-09-15 MED ORDER — HEPARIN SODIUM (PORCINE) 1000 UNIT/ML IJ SOLN
INTRAMUSCULAR | Status: AC | PRN
  Administered 2022-09-15: 1000 [IU] via INTRAVENOUS

## 2022-09-15 MED ORDER — IOHEXOL 300 MG/ML  SOLN
150.0000 mL | Freq: Once | INTRAMUSCULAR | Status: AC | PRN
  Administered 2022-09-15: 80 mL via INTRA_ARTERIAL

## 2022-09-15 MED ORDER — IOHEXOL 300 MG/ML  SOLN
100.0000 mL | Freq: Once | INTRAMUSCULAR | Status: AC | PRN
  Administered 2022-09-15: 20 mL via INTRA_ARTERIAL

## 2022-09-15 MED ORDER — SODIUM CHLORIDE 0.9 % IV SOLN: INTRAVENOUS | Status: AC

## 2022-09-15 MED ORDER — MIDAZOLAM HCL 2 MG/2ML IJ SOLN
INTRAMUSCULAR | Status: AC | PRN
  Administered 2022-09-15: 1 mg via INTRAVENOUS

## 2022-09-15 NOTE — Procedures (Signed)
INR.   Status post four-vessel cerebral arteriogram.  Right CFA approach.  Findings.  1.Interval significant worsening of the left internal carotid artery proximal stenoses to approximately 85 to 90%.  2.  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  3.  Interval development of significant IntraStent stenosis at the left vertebral junction/basilar artery.  Fatima Sanger MD.

## 2022-09-15 NOTE — Sedation Documentation (Signed)
This RN transported pt up to short stay with IR tech. Right groin assessed with short stay RN. No external bleeding or hematoma noted. Right DP pulse palpated. Short stay RN notified doppler was needed for the PT pulse for the right foot. Pt responds to voice.

## 2022-09-15 NOTE — Progress Notes (Signed)
Monroe Qin Crazier, PA notified of CBG 318 and no new orders

## 2022-09-18 LAB — HEMOGLOBIN A1C
Hgb A1c MFr Bld: >15.5 % — ABNORMAL HIGH (ref 4.8–5.6)
Mean Plasma Glucose: >398 mg/dL

## 2022-09-25 DIAGNOSIS — J449 Chronic obstructive pulmonary disease, unspecified: Secondary | ICD-10-CM | POA: Diagnosis not present

## 2022-09-25 DIAGNOSIS — E1159 Type 2 diabetes mellitus with other circulatory complications: Secondary | ICD-10-CM | POA: Diagnosis not present

## 2022-09-25 DIAGNOSIS — R519 Headache, unspecified: Secondary | ICD-10-CM | POA: Diagnosis not present

## 2022-09-25 DIAGNOSIS — E039 Hypothyroidism, unspecified: Secondary | ICD-10-CM | POA: Diagnosis not present

## 2022-09-25 DIAGNOSIS — F17200 Nicotine dependence, unspecified, uncomplicated: Secondary | ICD-10-CM | POA: Diagnosis not present

## 2022-09-25 DIAGNOSIS — K219 Gastro-esophageal reflux disease without esophagitis: Secondary | ICD-10-CM | POA: Diagnosis not present

## 2022-09-25 DIAGNOSIS — I2510 Atherosclerotic heart disease of native coronary artery without angina pectoris: Secondary | ICD-10-CM | POA: Diagnosis not present

## 2022-09-25 DIAGNOSIS — E785 Hyperlipidemia, unspecified: Secondary | ICD-10-CM | POA: Diagnosis not present

## 2022-09-25 DIAGNOSIS — E1140 Type 2 diabetes mellitus with diabetic neuropathy, unspecified: Secondary | ICD-10-CM | POA: Diagnosis not present

## 2022-09-25 DIAGNOSIS — I10 Essential (primary) hypertension: Secondary | ICD-10-CM | POA: Diagnosis not present

## 2022-10-02 DIAGNOSIS — E1159 Type 2 diabetes mellitus with other circulatory complications: Secondary | ICD-10-CM | POA: Diagnosis not present

## 2022-10-02 DIAGNOSIS — E1140 Type 2 diabetes mellitus with diabetic neuropathy, unspecified: Secondary | ICD-10-CM | POA: Diagnosis not present

## 2022-10-02 DIAGNOSIS — E785 Hyperlipidemia, unspecified: Secondary | ICD-10-CM | POA: Diagnosis not present

## 2022-10-02 DIAGNOSIS — I10 Essential (primary) hypertension: Secondary | ICD-10-CM | POA: Diagnosis not present

## 2022-10-02 DIAGNOSIS — E039 Hypothyroidism, unspecified: Secondary | ICD-10-CM | POA: Diagnosis not present

## 2022-10-02 DIAGNOSIS — Z6826 Body mass index (BMI) 26.0-26.9, adult: Secondary | ICD-10-CM | POA: Diagnosis not present

## 2022-10-02 DIAGNOSIS — F17200 Nicotine dependence, unspecified, uncomplicated: Secondary | ICD-10-CM | POA: Diagnosis not present

## 2022-10-02 DIAGNOSIS — Z8673 Personal history of transient ischemic attack (TIA), and cerebral infarction without residual deficits: Secondary | ICD-10-CM | POA: Diagnosis not present

## 2022-10-02 DIAGNOSIS — K219 Gastro-esophageal reflux disease without esophagitis: Secondary | ICD-10-CM | POA: Diagnosis not present

## 2022-10-02 DIAGNOSIS — R519 Headache, unspecified: Secondary | ICD-10-CM | POA: Diagnosis not present

## 2022-10-02 DIAGNOSIS — J449 Chronic obstructive pulmonary disease, unspecified: Secondary | ICD-10-CM | POA: Diagnosis not present

## 2022-10-02 DIAGNOSIS — I2510 Atherosclerotic heart disease of native coronary artery without angina pectoris: Secondary | ICD-10-CM | POA: Diagnosis not present

## 2022-10-04 ENCOUNTER — Emergency Department (HOSPITAL_COMMUNITY)
Admission: EM | Admit: 2022-10-04 | Discharge: 2022-10-04 | Disposition: A | Discharge status: Home / Self Care | Payer: Medicare HMO | Attending: Emergency Medicine | Admitting: Emergency Medicine

## 2022-10-04 ENCOUNTER — Emergency Department (HOSPITAL_COMMUNITY): Payer: Medicare HMO

## 2022-10-04 ENCOUNTER — Other Ambulatory Visit: Payer: Self-pay

## 2022-10-04 ENCOUNTER — Encounter (HOSPITAL_COMMUNITY): Payer: Self-pay

## 2022-10-04 DIAGNOSIS — Z7982 Long term (current) use of aspirin: Secondary | ICD-10-CM | POA: Diagnosis not present

## 2022-10-04 DIAGNOSIS — R300 Dysuria: Secondary | ICD-10-CM | POA: Diagnosis not present

## 2022-10-04 DIAGNOSIS — N200 Calculus of kidney: Secondary | ICD-10-CM | POA: Diagnosis not present

## 2022-10-04 DIAGNOSIS — Z7902 Long term (current) use of antithrombotics/antiplatelets: Secondary | ICD-10-CM | POA: Insufficient documentation

## 2022-10-04 DIAGNOSIS — R109 Unspecified abdominal pain: Secondary | ICD-10-CM | POA: Diagnosis not present

## 2022-10-04 DIAGNOSIS — R1032 Left lower quadrant pain: Secondary | ICD-10-CM | POA: Diagnosis not present

## 2022-10-04 DIAGNOSIS — N3090 Cystitis, unspecified without hematuria: Secondary | ICD-10-CM | POA: Diagnosis not present

## 2022-10-04 LAB — BASIC METABOLIC PANEL
Anion gap: 10 (ref 5–15)
BUN: 15 mg/dL (ref 6–20)
CO2: 26 mmol/L (ref 22–32)
Calcium: 9.6 mg/dL (ref 8.9–10.3)
Chloride: 102 mmol/L (ref 98–111)
Creatinine, Ser: 0.86 mg/dL (ref 0.61–1.24)
GFR, Estimated: >60 mL/min (ref >60)
Glucose, Bld: 245 mg/dL — ABNORMAL HIGH (ref 70–99)
Potassium: 3.7 mmol/L (ref 3.5–5.1)
Sodium: 138 mmol/L (ref 135–145)

## 2022-10-04 LAB — URINALYSIS, ROUTINE W REFLEX MICROSCOPIC
Bacteria, UA: NONE SEEN
Bilirubin Urine: NEGATIVE
Glucose, UA: >=500 mg/dL — AB
Hgb urine dipstick: NEGATIVE
Ketones, ur: NEGATIVE mg/dL
Leukocytes,Ua: NEGATIVE
Nitrite: NEGATIVE
Protein, ur: 100 mg/dL — AB
Specific Gravity, Urine: 1.022 (ref 1.005–1.030)
pH: 5 (ref 5.0–8.0)

## 2022-10-04 LAB — CBC WITH DIFFERENTIAL/PLATELET
Abs Immature Granulocytes: 0.04 10*3/uL (ref 0.00–0.07)
Basophils Absolute: 0.1 10*3/uL (ref 0.0–0.1)
Basophils Relative: 1 %
Eosinophils Absolute: 0.4 10*3/uL (ref 0.0–0.5)
Eosinophils Relative: 4 %
HCT: 48.7 % (ref 39.0–52.0)
Hemoglobin: 15.6 g/dL (ref 13.0–17.0)
Immature Granulocytes: 0 %
Lymphocytes Relative: 44 %
Lymphs Abs: 4.4 10*3/uL — ABNORMAL HIGH (ref 0.7–4.0)
MCH: 28.6 pg (ref 26.0–34.0)
MCHC: 32 g/dL (ref 30.0–36.0)
MCV: 89.4 fL (ref 80.0–100.0)
Monocytes Absolute: 0.6 10*3/uL (ref 0.1–1.0)
Monocytes Relative: 6 %
Neutro Abs: 4.3 10*3/uL (ref 1.7–7.7)
Neutrophils Relative %: 45 %
Platelets: 313 10*3/uL (ref 150–400)
RBC: 5.45 MIL/uL (ref 4.22–5.81)
RDW: 14 % (ref 11.5–15.5)
WBC: 9.7 10*3/uL (ref 4.0–10.5)
nRBC: 0 % (ref 0.0–0.2)

## 2022-10-04 MED ORDER — SODIUM CHLORIDE 0.9 % IV BOLUS
1000.0000 mL | Freq: Once | INTRAVENOUS | Status: AC
  Administered 2022-10-04: 1000 mL via INTRAVENOUS

## 2022-10-04 MED ORDER — FENTANYL CITRATE PF 50 MCG/ML IJ SOSY
50.0000 ug | PREFILLED_SYRINGE | Freq: Once | INTRAMUSCULAR | Status: AC
  Administered 2022-10-04: 50 ug via INTRAVENOUS
  Filled 2022-10-04: qty 1

## 2022-10-04 MED ORDER — HYDROCODONE-ACETAMINOPHEN 5-325 MG PO TABS: 1.0000 | ORAL_TABLET | Freq: Four times a day (QID) | ORAL | 0 refills | Status: DC | PRN

## 2022-10-04 MED ORDER — KETOROLAC TROMETHAMINE 30 MG/ML IJ SOLN
15.0000 mg | Freq: Once | INTRAMUSCULAR | Status: AC
  Administered 2022-10-04: 15 mg via INTRAVENOUS
  Filled 2022-10-04: qty 1

## 2022-10-04 NOTE — ED Provider Notes (Signed)
[place] EMERGENCY DEPARTMENT AT [facility] [place] [facility]  Provider Note  CSN: [id number] Arrival date & time: 10/04/22 8295  History Chief Complaint  Patient presents with   Flank Pain    Devin [name] is a 52 y.o. male with complex vascular history, also has prior renal stones. He reports 3 days of intermittent L lower abdomen and groin pain, worsening tonight. Some dysuria, no hematuria. No fever.    Home Medications Prior to Admission medications   Medication Sig Start Date End Date Taking? Authorizing Provider  HYDROcodone-acetaminophen (NORCO/VICODIN) 5-325 MG tablet Take 1 tablet by mouth every 6 (six) hours as needed for severe pain. 10/04/22  Yes Pollyann Savoy, MD  aspirin 81 MG EC tablet Take 1 tablet (81 mg total) by mouth daily. With food 12/05/17   Shon Hale, MD  clopidogrel (PLAVIX) 75 MG tablet Take 1 tablet (75 mg total) by mouth daily. 10/15/19   Cleora Fleet, MD  Continuous Blood Gluc Receiver (FREESTYLE LIBRE 2 READER) DEVI USE AS DIRECTED 09/02/21   Roma Kayser, MD  Continuous Blood Gluc Sensor (FREESTYLE LIBRE 2 SENSOR) MISC APPLY 1 PATCH TO SKIN ONCE EVERY 14 DAYS 03/27/22   Roma Kayser, MD  docusate sodium (COLACE) 100 MG capsule Take 100 mg by mouth daily.    [provider]  DULERA 200-5 MCG/ACT AERO Inhale 2 puffs into the lungs 2 (two) times daily. 10/15/19   Johnson, Clanford L, MD  EUTHYROX 200 MCG tablet Take 200 mcg by mouth at bedtime. 05/13/20   [provider]  Evolocumab (REPATHA SURECLICK) 140 MG/ML SOAJ Inject 1 pen into the skin every 14 (fourteen) days. 12/24/19   Antoine Poche, MD  ezetimibe (ZETIA) 10 MG tablet Take 1 tablet (10 mg total) by mouth daily. 10/15/19   Johnson, Clanford L, MD  famotidine (PEPCID) 40 MG tablet Take 40 mg by mouth daily. 11/23/20   [provider]  fenofibrate (TRICOR) 145 MG tablet Take 1 tablet (145 mg total) by mouth daily. 10/15/19   Johnson, Clanford L, MD   fluticasone (FLONASE) 50 MCG/ACT nasal spray Place 2 sprays into both nostrils 2 (two) times daily. 06/24/20   [provider]  furosemide (LASIX) 20 MG tablet Take 1 tablet (20 mg total) by mouth daily as needed. 10/19/21   Antoine Poche, MD  gabapentin (NEURONTIN) 300 MG capsule One po q AM, one po q PM and 2 po qHS Patient taking differently: Take 300 mg by mouth 2 (two) times daily. 12/23/19   Sater, Pearletha Furl, MD  glipiZIDE (GLUCOTROL XL) 10 MG 24 hr tablet Take 1 tablet (10 mg total) by mouth daily. 06/15/21   Roma Kayser, MD  glucose blood (ACCU-CHEK GUIDE) test strip Test glucose 2 times a day 08/10/20   Roma Kayser, MD  icosapent Ethyl (VASCEPA) 1 g capsule Take 2 capsules by mouth twice daily 02/06/22   Antoine Poche, MD  Insulin Lispro Prot & Lispro (HUMALOG MIX 75/25 KWIKPEN) (75-25) 100 UNIT/ML Kwikpen Inject 100 Units into the skin 2 (two) times daily with a meal. 07/13/21   Nida, Denman George, MD  Insulin Pen Needle (B-D ULTRAFINE III SHORT PEN) 31G X 8 MM MISC Use as directed to inject insulin 2 times daily. 05/02/21   Roma Kayser, MD  levothyroxine (SYNTHROID) 88 MCG tablet Take 88 mcg by mouth daily. 01/25/21   [provider]  nitroGLYCERIN (NITROSTAT) 0.4 MG SL tablet Place 1 tablet (0.4  mg total) under the tongue every 5 (five) minutes as needed for chest pain. 02/14/21   Antoine Poche, MD  OLUMIANT 4 MG TABS Take 1 tablet by mouth daily. Patient not taking: Reported on 04/04/2022 12/30/21   [provider]  pantoprazole (PROTONIX) 40 MG tablet Take 1 tablet by mouth once daily 08/21/22   Antoine Poche, MD  sitaGLIPtin (JANUVIA) 50 MG tablet Take 1 tablet (50 mg total) by mouth daily. 06/15/21   Roma Kayser, MD  VENTOLIN HFA 108 (90 Base) MCG/ACT inhaler Inhale into the lungs. 11/23/20   [provider]     Allergies    Crestor [rosuvastatin], Trulicity [dulaglutide], and Bee  venom   Review of Systems   Review of Systems Please see HPI for pertinent positives and negatives  Physical Exam BP 123/72   Pulse 79   Temp 98.1 F (36.7 C) (Oral)   Resp 20   Ht 6\' 6"  (1.981 m)   Wt 127 kg   SpO2 96%   BMI 32.36 kg/m   Physical Exam Vitals and nursing note reviewed.  Constitutional:      Appearance: Normal appearance.  HENT:     Head: Normocephalic and atraumatic.     Nose: Nose normal.     Mouth/Throat:     Mouth: Mucous membranes are moist.  Eyes:     Extraocular Movements: Extraocular movements intact.     Conjunctiva/sclera: Conjunctivae normal.  Cardiovascular:     Rate and Rhythm: Normal rate.  Pulmonary:     Effort: Pulmonary effort is normal.     Breath sounds: Normal breath sounds.  Abdominal:     General: Abdomen is flat.     Palpations: Abdomen is soft.     Tenderness: There is no abdominal tenderness. There is no guarding.  Genitourinary:    Comments: No inguinal mass or hernia Musculoskeletal:        General: No swelling. Normal range of motion.     Cervical back: Neck supple.  Skin:    General: Skin is warm and dry.  Neurological:     General: No focal deficit present.     Mental Status: He is alert.  Psychiatric:        Mood and Affect: Mood normal.     ED Results / Procedures / Treatments   EKG None  Procedures Procedures  Medications Ordered in the ED Medications  ketorolac (TORADOL) 30 MG/ML injection 15 mg (has no administration in time range)  fentaNYL (SUBLIMAZE) injection 50 mcg (50 mcg Intravenous Given 10/04/22 0413)  sodium chloride 0.9 % bolus 1,000 mL (1,000 mLs Intravenous New Bag/Given 10/04/22 0412)    Initial Impression and Plan  Patient here with symptoms most consistent with renal colic. Will check labs, send for CT, pain meds for comfort.   ED Course   Clinical Course as of 10/04/22 0527  Wed Oct 04, 2022  0429 CBC is normal.  [CS]  0433 BMP with mild hyperglycemia, otherwise negative.   [CS]  0438 UA without blood or infection.  [CS]  [id number] I personally viewed the images from radiology studies and agree with radiologist interpretation: CT is neg for ureteral stone, hydronephrosis or hernia as a cause of his groin pain. Patient reports some improvement after fentanyl, will give a dose of Toradol. Otherwise his labs are unremarkable. No emergent or surgical cause of his pain found. Plan discharge with PCP follow up, RTED for any other concerns.   [CS]  Clinical Course User Index [CS] Pollyann Savoy, MD     MDM Rules/Calculators/A&P Medical Decision Making Problems Addressed: Groin pain, left: acute illness or injury  Amount and/or Complexity of Data Reviewed Labs: ordered. Decision-making details documented in ED Course. Radiology: ordered and independent interpretation performed. Decision-making details documented in ED Course.  Risk Prescription drug management. Parenteral controlled substances.     Final Clinical Impression(s) / ED Diagnoses Final diagnoses:  Groin pain, left    Rx / DC Orders ED Discharge Orders          Ordered    HYDROcodone-acetaminophen (NORCO/VICODIN) 5-325 MG tablet  Every 6 hours PRN        10/04/22 0527             Pollyann Savoy, MD 10/04/22 [phone]

## 2022-10-04 NOTE — ED Triage Notes (Signed)
Pt c/o left side groin pain, N&V. Reports hx of kidney stones.

## 2022-10-10 ENCOUNTER — Other Ambulatory Visit (HOSPITAL_COMMUNITY): Payer: Self-pay

## 2022-10-16 ENCOUNTER — Other Ambulatory Visit (HOSPITAL_COMMUNITY): Payer: Self-pay | Admitting: Interventional Radiology

## 2022-10-16 DIAGNOSIS — Z8673 Personal history of transient ischemic attack (TIA), and cerebral infarction without residual deficits: Secondary | ICD-10-CM | POA: Diagnosis not present

## 2022-10-16 DIAGNOSIS — E039 Hypothyroidism, unspecified: Secondary | ICD-10-CM | POA: Diagnosis not present

## 2022-10-16 DIAGNOSIS — K219 Gastro-esophageal reflux disease without esophagitis: Secondary | ICD-10-CM | POA: Diagnosis not present

## 2022-10-16 DIAGNOSIS — I771 Stricture of artery: Secondary | ICD-10-CM

## 2022-10-16 DIAGNOSIS — E1159 Type 2 diabetes mellitus with other circulatory complications: Secondary | ICD-10-CM | POA: Diagnosis not present

## 2022-10-16 DIAGNOSIS — R519 Headache, unspecified: Secondary | ICD-10-CM | POA: Diagnosis not present

## 2022-10-16 DIAGNOSIS — R197 Diarrhea, unspecified: Secondary | ICD-10-CM | POA: Diagnosis not present

## 2022-10-16 DIAGNOSIS — E1140 Type 2 diabetes mellitus with diabetic neuropathy, unspecified: Secondary | ICD-10-CM | POA: Diagnosis not present

## 2022-10-16 DIAGNOSIS — I10 Essential (primary) hypertension: Secondary | ICD-10-CM | POA: Diagnosis not present

## 2022-10-16 DIAGNOSIS — J449 Chronic obstructive pulmonary disease, unspecified: Secondary | ICD-10-CM | POA: Diagnosis not present

## 2022-10-16 DIAGNOSIS — E785 Hyperlipidemia, unspecified: Secondary | ICD-10-CM | POA: Diagnosis not present

## 2022-10-16 DIAGNOSIS — I2510 Atherosclerotic heart disease of native coronary artery without angina pectoris: Secondary | ICD-10-CM | POA: Diagnosis not present

## 2022-10-16 DIAGNOSIS — F17200 Nicotine dependence, unspecified, uncomplicated: Secondary | ICD-10-CM | POA: Diagnosis not present

## 2022-10-17 ENCOUNTER — Telehealth (HOSPITAL_COMMUNITY): Payer: Self-pay

## 2022-10-17 NOTE — Telephone Encounter (Signed)
Called to schedule consult, no answer, left vm. AB  

## 2022-10-23 ENCOUNTER — Encounter (INDEPENDENT_AMBULATORY_CARE_PROVIDER_SITE_OTHER): Payer: Self-pay | Admitting: *Deleted

## 2022-10-24 DIAGNOSIS — D4102 Neoplasm of uncertain behavior of left kidney: Secondary | ICD-10-CM | POA: Diagnosis not present

## 2022-10-24 DIAGNOSIS — N200 Calculus of kidney: Secondary | ICD-10-CM | POA: Diagnosis not present

## 2022-10-26 ENCOUNTER — Other Ambulatory Visit: Payer: Self-pay | Admitting: Urology

## 2022-10-26 DIAGNOSIS — D4102 Neoplasm of uncertain behavior of left kidney: Secondary | ICD-10-CM

## 2022-10-30 DIAGNOSIS — I10 Essential (primary) hypertension: Secondary | ICD-10-CM | POA: Diagnosis not present

## 2022-10-30 DIAGNOSIS — E039 Hypothyroidism, unspecified: Secondary | ICD-10-CM | POA: Diagnosis not present

## 2022-10-30 DIAGNOSIS — R197 Diarrhea, unspecified: Secondary | ICD-10-CM | POA: Diagnosis not present

## 2022-10-30 DIAGNOSIS — F17200 Nicotine dependence, unspecified, uncomplicated: Secondary | ICD-10-CM | POA: Diagnosis not present

## 2022-10-30 DIAGNOSIS — E785 Hyperlipidemia, unspecified: Secondary | ICD-10-CM | POA: Diagnosis not present

## 2022-10-30 DIAGNOSIS — Z8673 Personal history of transient ischemic attack (TIA), and cerebral infarction without residual deficits: Secondary | ICD-10-CM | POA: Diagnosis not present

## 2022-10-30 DIAGNOSIS — E1140 Type 2 diabetes mellitus with diabetic neuropathy, unspecified: Secondary | ICD-10-CM | POA: Diagnosis not present

## 2022-10-30 DIAGNOSIS — K219 Gastro-esophageal reflux disease without esophagitis: Secondary | ICD-10-CM | POA: Diagnosis not present

## 2022-10-30 DIAGNOSIS — I2510 Atherosclerotic heart disease of native coronary artery without angina pectoris: Secondary | ICD-10-CM | POA: Diagnosis not present

## 2022-10-30 DIAGNOSIS — J449 Chronic obstructive pulmonary disease, unspecified: Secondary | ICD-10-CM | POA: Diagnosis not present

## 2022-10-30 DIAGNOSIS — E1159 Type 2 diabetes mellitus with other circulatory complications: Secondary | ICD-10-CM | POA: Diagnosis not present

## 2022-10-30 DIAGNOSIS — R519 Headache, unspecified: Secondary | ICD-10-CM | POA: Diagnosis not present

## 2022-11-02 ENCOUNTER — Telehealth (HOSPITAL_COMMUNITY): Payer: Self-pay | Admitting: Radiology

## 2022-11-02 NOTE — Telephone Encounter (Signed)
Called pt's sister Devin [name]) to schedule consult with Deveshwar following recent angiogram. Pt's sister states that Devin [name] still has not gotten his BS's under good control. Currently his PCP is managing his sugar control but the pt does have an appt to see Dr. Fransico Him in early October. Per Dr. Corliss Skains pt is not a good candidate for stenosis tx until his diabetes is under control. Will call pt's sister back once discussed with Deveshar. She agrees to this plan of care.  JM

## 2022-11-03 ENCOUNTER — Ambulatory Visit (INDEPENDENT_AMBULATORY_CARE_PROVIDER_SITE_OTHER): Payer: Medicare HMO | Admitting: Gastroenterology

## 2022-11-05 DIAGNOSIS — R197 Diarrhea, unspecified: Secondary | ICD-10-CM | POA: Diagnosis not present

## 2022-11-07 ENCOUNTER — Encounter (INDEPENDENT_AMBULATORY_CARE_PROVIDER_SITE_OTHER): Payer: Self-pay | Admitting: Gastroenterology

## 2022-11-07 ENCOUNTER — Encounter (INDEPENDENT_AMBULATORY_CARE_PROVIDER_SITE_OTHER): Payer: Self-pay

## 2022-11-07 ENCOUNTER — Telehealth (INDEPENDENT_AMBULATORY_CARE_PROVIDER_SITE_OTHER): Payer: Self-pay | Admitting: Gastroenterology

## 2022-11-07 ENCOUNTER — Ambulatory Visit (INDEPENDENT_AMBULATORY_CARE_PROVIDER_SITE_OTHER): Payer: Medicare HMO | Admitting: Gastroenterology

## 2022-11-07 VITALS — BP 124/83 | HR 83 | Temp 97.8°F | Ht 78.0 in | Wt 221.2 lb

## 2022-11-07 DIAGNOSIS — R634 Abnormal weight loss: Secondary | ICD-10-CM | POA: Diagnosis not present

## 2022-11-07 DIAGNOSIS — K529 Noninfective gastroenteritis and colitis, unspecified: Secondary | ICD-10-CM

## 2022-11-07 MED ORDER — PSYLLIUM 58.6 % PO PACK
1.0000 | PACK | Freq: Two times a day (BID) | ORAL | 2 refills | Status: AC
Start: 2022-11-07 — End: 2023-02-05

## 2022-11-07 NOTE — Progress Notes (Signed)
Devin [name] , M.D. Gastroenterology & Hepatology [facility] [address] [place], [place] [address] Primary Care Physician: Donetta Potts, MD [address] [place] [address]  Chief Complaint: Chronic diarrhea, unintentional weight loss  History of Present Illness: Devin [name] is a 52 y.o. 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  who presents for evaluation of Chronic diarrhea and unintentional weight loss.  Patient is accompanied by wife.  He reports that he having profuse diarrhea at least 4-5 times a day for the past 3 months.  Reports every time he eats he has a bowel movement and he feels he is not absorbing anything.  Denies any abdominal pain but has very frequent burping and feels nauseous.  Although he reports he has good appetite patient has lost significant weight.  Reports of losing 50 pounds which is unintentional weight loss despite eating adequately.  Patient continues to smoke.  Has a family history of colon cancer and grandmother and 2 of the uncles The patient denies having any nausea, vomiting, fever, chills, hematochezia, melena, hematemesis,  jaundice, pruritus  Last ZDG:LOVF  Last Colonoscopy:06/2020  The perianal and digital rectal examinations were normal. Pertinent       negatives include normal sphincter tone.       Hemorrhoids were found on perianal exam.       Retroflexion in the right colon was performed.       Eight sessile polyps were found at 30 cm proximal to the anus, at 45 cm       proximal to the anus, at 50 cm proximal to the anus, at 100 cm proximal       to the anus, at 120 cm proximal to the anus and at 130 cm proximal to       the anus. The polyps were 5 to 8 mm in size. These polyps were removed       with a hot snare. Resection and retrieval were complete. Verification of       patient identification for the specimen was done by the nurse  using the       patient's name and medical record number. Estimated blood loss: none.       The exam was otherwise without abnormality on direct and retroflexion       views.   Repeat :3-5 years   Path: 2 SSA 2TA 1HP Hemoglobin 15.6 platelet 266.  Hemoglobin A1c more than 15.5  FHx: Has a family history of colon cancer in grandmother and 2 of the uncles Social: Current severe smoker, no  alcohol or illicit drug use Surgical: no abdominal surgeries  Past Medical History: Past Medical History:  Diagnosis Date   Asthma    Chest pain    CHF (congestive heart failure) (HCC)    CKD (chronic kidney disease) 11/13/2017   Coronary artery disease    a. s/p DES x2 to LAD and DES to OM in 11/2017   Depression    Dyspnea    GERD (gastroesophageal reflux disease)    Headache    History of kidney stones    History of rhabdomyolysis    Hyperlipidemia    Hypertension    Hypothyroidism    Ischemic cardiomyopathy    a. 11/2017: echo showing EF of 35-40%, diffuse HK, and Grade 2 DD   Kidney calculus 2014   Pericardial effusion    Small, by dobutamine echocardiogram,  04/2006   PONV (postoperative nausea and vomiting)    Sleep apnea    does not wear cpap   Stroke (HCC)    Tobacco abuse    Type 2 diabetes mellitus without complications (HCC) 10/07/2013   Vitamin D deficiency     Past Surgical History: Past Surgical History:  Procedure Laterality Date   APPENDECTOMY     CARDIAC CATHETERIZATION  09/2012   "Nonobstructive CAD with 30% proximal, 40% mid LAD disease; 30% proximal CFX; EF 55-65%"   CHOLECYSTECTOMY N/A 01/27/2013   Procedure: LAPAROSCOPIC CHOLECYSTECTOMY;  Surgeon: Dalia Heading, MD;  Location: [facility];  Service: General;  Laterality: N/A;   CORONARY STENT INTERVENTION N/A 11/15/2017   Procedure: CORONARY STENT INTERVENTION;  Surgeon: Marykay Lex, MD;  Location: [facility] INVASIVE CV LAB;  Service: Cardiovascular;  Laterality: N/A;   HERNIA REPAIR     As a child   INSERTION  OF MESH N/A 11/13/2012   Procedure: INSERTION OF MESH;  Surgeon: Dalia Heading, MD;  Location: [facility];  Service: General;  Laterality: N/A;   IR ANGIO INTRA EXTRACRAN SEL COM CAROTID INNOMINATE BILAT MOD SED  12/01/2017   IR ANGIO INTRA EXTRACRAN SEL COM CAROTID INNOMINATE BILAT MOD SED  02/13/2018   IR ANGIO INTRA EXTRACRAN SEL COM CAROTID INNOMINATE BILAT MOD SED  09/15/2022   IR ANGIO VERTEBRAL SEL SUBCLAVIAN INNOMINATE BILAT MOD SED  09/15/2022   IR ANGIO VERTEBRAL SEL SUBCLAVIAN INNOMINATE UNI L MOD SED  02/13/2018   IR ANGIO VERTEBRAL SEL VERTEBRAL BILAT MOD SED  12/01/2017   IR ANGIO VERTEBRAL SEL VERTEBRAL UNI R MOD SED  02/13/2018   IR INTRA CRAN STENT  12/03/2017   IR PTA INTRACRANIAL  02/25/2018   IR RADIOLOGIST EVAL & MGMT  07/29/2021   IR US GUIDE VASC ACCESS RIGHT  02/13/2018   KIDNEY STONE SURGERY     LEFT HEART CATH AND CORONARY ANGIOGRAPHY N/A 11/15/2017   Procedure: LEFT HEART CATH AND CORONARY ANGIOGRAPHY;  Surgeon: Marykay Lex, MD;  Location: [facility] INVASIVE CV LAB;  Service: Cardiovascular;  Laterality: N/A;   PERCUTANEOUS NEPHROLITHOTRIPSY     RADIOLOGY WITH ANESTHESIA Left 12/03/2017   Procedure: Angioplasty with possible stenting of left VBJ;  Surgeon: Julieanne Cotton, MD;  Location: [facility] OR;  Service: Radiology;  Laterality: Left;   RADIOLOGY WITH ANESTHESIA N/A 02/25/2018   Procedure: STENT PLACEMENT;  Surgeon: Julieanne Cotton, MD;  Location: [facility] OR;  Service: Radiology;  Laterality: N/A;   UMBILICAL HERNIA REPAIR N/A 11/13/2012   Procedure: UMBILICAL HERNIORRHAPHY;  Surgeon: Dalia Heading, MD;  Location: [facility];  Service: General;  Laterality: N/A;   VARICOCELECTOMY      Family History: Family History  Problem Relation Age of Onset   Coronary artery disease Father    Hypertension Father    Hyperlipidemia Father    Diabetes Father    Congestive Heart Failure Father 41   Arrhythmia Father        had an ICD   Diabetes Mother    Hypertension Mother    Hyperlipidemia  Mother    Obesity Mother 51       died after bariatric surgery, liver failure and infection   Coronary artery disease Maternal Grandfather        both grandfathers and several uncles   Coronary artery disease Other    Diabetes Sister        both sisters   Stroke Maternal Aunt    Stroke Maternal Uncle  Social History: Social History   Tobacco Use  Smoking Status Every Day   Current packs/day: 1.00   Average packs/day: 1 pack/day for 37.7 years (37.7 ttl pk-yrs)   Types: Cigarettes   Start date: 03/13/1985   Passive exposure: Never  Smokeless Tobacco Former   Types: Snuff, Chew   Quit date: 03/14/1987   Social History   Substance and Sexual Activity  Alcohol Use No   Social History   Substance and Sexual Activity  Drug Use No    Allergies: Allergies  Allergen Reactions   Crestor [Rosuvastatin] Other (See Comments)    rhabdomyolosis   Trulicity [Dulaglutide] Nausea And Vomiting   Bee Venom Swelling    SWELLING REACTION UNSPECIFIED    Morphine     Panic attack    Medications: Current Outpatient Medications  Medication Sig Dispense Refill   aspirin 81 MG EC tablet Take 1 tablet (81 mg total) by mouth daily. With food 30 tablet 0   clopidogrel (PLAVIX) 75 MG tablet Take 1 tablet (75 mg total) by mouth daily. 30 tablet 1   Continuous Blood Gluc Receiver (FREESTYLE LIBRE 2 READER) DEVI USE AS DIRECTED 1 each 0   Continuous Blood Gluc Sensor (FREESTYLE LIBRE 2 SENSOR) MISC APPLY 1 PATCH TO SKIN ONCE EVERY 14 DAYS 2 each 2   EUTHYROX 200 MCG tablet Take 200 mcg by mouth at bedtime.     Evolocumab (REPATHA SURECLICK) 140 MG/ML SOAJ Inject 1 pen into the skin every 14 (fourteen) days. 1 mL 11   ezetimibe (ZETIA) 10 MG tablet Take 1 tablet (10 mg total) by mouth daily. 30 tablet 1   famotidine (PEPCID) 40 MG tablet Take 40 mg by mouth daily.     fenofibrate (TRICOR) 145 MG tablet Take 1 tablet (145 mg total) by mouth daily. 30 tablet 1   fluticasone (FLONASE) 50 MCG/ACT  nasal spray Place 2 sprays into both nostrils 2 (two) times daily.     furosemide (LASIX) 20 MG tablet Take 1 tablet (20 mg total) by mouth daily as needed. 30 tablet 6   gabapentin (NEURONTIN) 300 MG capsule One po q AM, one po q PM and 2 po qHS (Patient taking differently: Take 300 mg by mouth 2 (two) times daily.) 120 capsule 11   glucose blood (ACCU-CHEK GUIDE) test strip Test glucose 2 times a day 100 each 2   icosapent Ethyl (VASCEPA) 1 g capsule Take 2 capsules by mouth twice daily 360 capsule 0   Insulin Lispro Prot & Lispro (HUMALOG MIX 75/25 KWIKPEN) (75-25) 100 UNIT/ML Kwikpen Inject 100 Units into the skin 2 (two) times daily with a meal. 60 mL 2   Insulin Pen Needle (B-D ULTRAFINE III SHORT PEN) 31G X 8 MM MISC Use as directed to inject insulin 2 times daily. 200 each 1   levothyroxine (SYNTHROID) 88 MCG tablet Take 88 mcg by mouth daily.     nitroGLYCERIN (NITROSTAT) 0.4 MG SL tablet Place 1 tablet (0.4 mg total) under the tongue every 5 (five) minutes as needed for chest pain. 25 tablet 3   pantoprazole (PROTONIX) 40 MG tablet Take 1 tablet by mouth once daily 15 tablet 0   psyllium (METAMUCIL) 58.6 % packet Take 1 packet by mouth 2 (two) times daily. 60 packet 2   sitaGLIPtin (JANUVIA) 50 MG tablet Take 1 tablet (50 mg total) by mouth daily. 90 tablet 1   VENTOLIN HFA 108 (90 Base) MCG/ACT inhaler Inhale into the lungs.     docusate  sodium (COLACE) 100 MG capsule Take 100 mg by mouth daily. (Patient not taking: Reported on 11/07/2022)     DULERA 200-5 MCG/ACT AERO Inhale 2 puffs into the lungs 2 (two) times daily. (Patient not taking: Reported on 11/07/2022) 8.8 g 1   No current facility-administered medications for this visit.    Review of Systems: GENERAL: negative for malaise, night sweats HEENT: No changes in hearing or vision, no nose bleeds or other nasal problems. NECK: Negative for lumps, goiter, pain and significant neck swelling RESPIRATORY: Negative for cough,  wheezing CARDIOVASCULAR: Negative for chest pain, leg swelling, palpitations, orthopnea GI: SEE HPI MUSCULOSKELETAL: Negative for joint pain or swelling, back pain, and muscle pain. SKIN: Negative for lesions, rash HEMATOLOGY Negative for prolonged bleeding, bruising easily, and swollen nodes. ENDOCRINE: Negative for cold or heat intolerance, polyuria, polydipsia and goiter. NEURO: negative for tremor, gait imbalance, syncope and seizures. The remainder of the review of systems is noncontributory.   Physical Exam: BP 124/83 (BP Location: Left Arm, Patient Position: Sitting, Cuff Size: Normal)   Pulse 83   Temp 97.8 F (36.6 C) (Oral)   Ht 6\' 6"  (1.981 m)   Wt 221 lb 3.2 oz (100.3 kg)   BMI 25.56 kg/m  GENERAL: The patient is AO x3, in no acute distress. HEENT: Head is normocephalic and atraumatic. EOMI are intact. Mouth is well hydrated and without lesions. NECK: Supple. No masses LUNGS: Clear to auscultation. No presence of rhonchi/wheezing/rales. Adequate chest expansion HEART: RRR, normal s1 and s2. ABDOMEN: Soft, nontender, no guarding, no peritoneal signs, and nondistended. BS +. No masses.  EXTREMITIES: Without any cyanosis, clubbing, rash, lesions or edema. NEUROLOGIC: AOx3, no focal motor deficit. SKIN: no jaundice, no rashes   Imaging/Labs: as above  I personally reviewed and interpreted the available labs, imaging and endoscopic files.  2023 IMPRESSION: Bilateral nonobstructing renal calculi worse on the right than the left stable from the prior exam.   Fatty liver.  No acute abnormality noted.  Last liver enzymes normal from January 2024  Hemoglobin 15.6 platelet 266.  Hemoglobin A1c more than 15.5  Impression and Plan:  Devin [name] is a 52 y.o. 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  who presents for evaluation of Chronic diarrhea and unintentional weight loss.  #Unintentional weight loss #New Upper GI  symptoms  Patient reports of unintentional weight for past few months of at least 40-50 lbs.  Last recorded weight was 280 pounds today to 221 pounds  He is a current smoker with family history of colon cancer.  Patient is up-to-date to good quality colonoscopy with TI intubation done at [facility].  2 SSA 2 TA's and 1 HP suggested repeat 3 years as patient is due for colonoscopy 2025  Patient is a chronic cigarette smoker with sudden unintentional weight loss will need to rule out other malignancies such as pancreatic cancer.  CT abdomen pelvis with pancreatic protocol as indicated  Even though he has good appetite patient continues to have diarrhea which may also be playing a role in sudden weight  Also patient with hemoglobin A1c more than 15.5 this may need to diabetic enteropathy and malabsorption leading to diarrhea and weight loss  Also with new upper GI symptoms with excessive burping and nausea and sudden weight loss but also obtain diagnostic upper endoscopy.  Patient will be continued on aspirin and Plavix as he is high risk for recurrent strokes.  Discussed the risk and  benefit of continuing DAPT with the procedure with need of having the procedure done again if large lesion is found which will be removed.  May take random biopsies with much caution.  Patient and wife agree to continue with DAPT and proceed with diagnostic upper endoscopy   #Chronic diarrhea   Patient has more than 3 loose stools for more than 4 weeks hence qualifies for chronic diarrhea  This could be secondary to secretory , osmotic ,functional ,malabsorptive or inflammatory diarrhea  There is no correlation with food intake and hence patient is recommended keeping a food diary  Patient diet is low in fiber and recommended Metamucil starting 1 scoop daily for 1 week followed by 2 scoops daily second week, and 3 scoops daily thereafter, to bulk up stool  We will obtain blood work including CRP liver function thyroid  function fecal calprotectin, check for celiac disease with TTG IgA and total IgA, and obtain stool studies  Also patient with hemoglobin A1c more than 15.5 this may need to diabetic enteropathy and malabsorption leading to diarrhea and weight loss  Orders:   - TSH - Sodium, stool - Potassium, stool - H. pylori antigen, stool - GI Profile, Stool, PCR - Fecal leukocytes - Calprotectin, Fecal - C-reactive protein - C difficile Toxins A+B W/Rflx - Tissue Transglutaminase Abs,IgG,IgA - IgA  #Uncontrolled Diabetes Patient with hemoglobin A1c more than 15.5 and many comorbidities such as coronary artery disease and recurrent strokes.  Discussed in length the necessity to have adequate blood glucose control Patient will follow with PCP  Orders Placed This Encounter  Procedures   C difficile Toxins A+B W/Rflx   Calprotectin, Fecal   Fecal leukocytes   CT ABDOMEN PELVIS W WO CONTRAST   Sodium, stool   IgA   Tissue Transglutaminase Abs,IgG,IgA   C-reactive protein   Comprehensive metabolic panel   GI Profile, Stool, PCR   H. pylori antigen, stool   Potassium, stool   TSH      All questions were answered.      Devin Lawman, MD Gastroenterology and Hepatology [facility] Gastroenterology   This chart has been completed using [facility] Dictation software, and while attempts have been made to ensure accuracy , certain words and phrases may not be transcribed as intended

## 2022-11-07 NOTE — H&P (View-Only) (Signed)
Vista Lawman , M.D. Gastroenterology & Hepatology [facility] Gastroenterology [address] [place], [place] [address] Primary Care Physician: Donetta Potts, MD [address] [place] [address]  Chief Complaint: Chronic diarrhea, unintentional weight loss  History of Present Illness: Devin [name] is a 52 y.o. 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  who presents for evaluation of Chronic diarrhea and unintentional weight loss.  Patient is accompanied by wife.  He reports that he having profuse diarrhea at least 4-5 times a day for the past 3 months.  Reports every time he eats he has a bowel movement and he feels he is not absorbing anything.  Denies any abdominal pain but has very frequent burping and feels nauseous.  Although he reports he has good appetite patient has lost significant weight.  Reports of losing 50 pounds which is unintentional weight loss despite eating adequately.  Patient continues to smoke.  Has a family history of colon cancer and grandmother and 2 of the uncles The patient denies having any nausea, vomiting, fever, chills, hematochezia, melena, hematemesis,  jaundice, pruritus  Last LKG:MWNU  Last Colonoscopy:06/2020  The perianal and digital rectal examinations were normal. Pertinent       negatives include normal sphincter tone.       Hemorrhoids were found on perianal exam.       Retroflexion in the right colon was performed.       Eight sessile polyps were found at 30 cm proximal to the anus, at 45 cm       proximal to the anus, at 50 cm proximal to the anus, at 100 cm proximal       to the anus, at 120 cm proximal to the anus and at 130 cm proximal to       the anus. The polyps were 5 to 8 mm in size. These polyps were removed       with a hot snare. Resection and retrieval were complete. Verification of       patient identification for the specimen was done by the nurse  using the       patient's name and medical record number. Estimated blood loss: none.       The exam was otherwise without abnormality on direct and retroflexion       views.   Repeat :3-5 years   Path: 2 SSA 2TA 1HP Hemoglobin 15.6 platelet 266.  Hemoglobin A1c more than 15.5  FHx: Has a family history of colon cancer in grandmother and 2 of the uncles Social: Current severe smoker, no  alcohol or illicit drug use Surgical: no abdominal surgeries  Past Medical History: Past Medical History:  Diagnosis Date   Asthma    Chest pain    CHF (congestive heart failure) (HCC)    CKD (chronic kidney disease) 11/13/2017   Coronary artery disease    a. s/p DES x2 to LAD and DES to OM in 11/2017   Depression    Dyspnea    GERD (gastroesophageal reflux disease)    Headache    History of kidney stones    History of rhabdomyolysis    Hyperlipidemia    Hypertension    Hypothyroidism    Ischemic cardiomyopathy    a. 11/2017: echo showing EF of 35-40%, diffuse HK, and Grade 2 DD   Kidney calculus 2014   Pericardial effusion    Small, by dobutamine echocardiogram,  04/2006   PONV (postoperative nausea and vomiting)    Sleep apnea    does not wear cpap   Stroke (HCC)    Tobacco abuse    Type 2 diabetes mellitus without complications (HCC) 10/07/2013   Vitamin D deficiency     Past Surgical History: Past Surgical History:  Procedure Laterality Date   APPENDECTOMY     CARDIAC CATHETERIZATION  09/2012   "Nonobstructive CAD with 30% proximal, 40% mid LAD disease; 30% proximal CFX; EF 55-65%"   CHOLECYSTECTOMY N/A 01/27/2013   Procedure: LAPAROSCOPIC CHOLECYSTECTOMY;  Surgeon: Dalia Heading, MD;  Location: [facility] ORS;  Service: General;  Laterality: N/A;   CORONARY STENT INTERVENTION N/A 11/15/2017   Procedure: CORONARY STENT INTERVENTION;  Surgeon: Marykay Lex, MD;  Location: [facility] INVASIVE CV LAB;  Service: Cardiovascular;  Laterality: N/A;   HERNIA REPAIR     As a child   INSERTION  OF MESH N/A 11/13/2012   Procedure: INSERTION OF MESH;  Surgeon: Dalia Heading, MD;  Location: [facility]S;  Service: General;  Laterality: N/A;   IR ANGIO INTRA EXTRACRAN SEL COM CAROTID INNOMINATE BILAT MOD SED  12/01/2017   IR ANGIO INTRA EXTRACRAN SEL COM CAROTID INNOMINATE BILAT MOD SED  02/13/2018   IR ANGIO INTRA EXTRACRAN SEL COM CAROTID INNOMINATE BILAT MOD SED  09/15/2022   IR ANGIO VERTEBRAL SEL SUBCLAVIAN INNOMINATE BILAT MOD SED  09/15/2022   IR ANGIO VERTEBRAL SEL SUBCLAVIAN INNOMINATE UNI L MOD SED  02/13/2018   IR ANGIO VERTEBRAL SEL VERTEBRAL BILAT MOD SED  12/01/2017   IR ANGIO VERTEBRAL SEL VERTEBRAL UNI R MOD SED  02/13/2018   IR INTRA CRAN STENT  12/03/2017   IR PTA INTRACRANIAL  02/25/2018   IR RADIOLOGIST EVAL & MGMT  07/29/2021   IR US GUIDE VASC ACCESS RIGHT  02/13/2018   KIDNEY STONE SURGERY     LEFT HEART CATH AND CORONARY ANGIOGRAPHY N/A 11/15/2017   Procedure: LEFT HEART CATH AND CORONARY ANGIOGRAPHY;  Surgeon: Marykay Lex, MD;  Location[facility] INVASIVE CV LAB;  Service: Cardiovascular;  Laterality: N/A;   PERCUTANEOUS NEPHROLITHOTRIPSY     RADIOLOGY WITH ANESTHESIA Left 12/03/2017   Procedure: Angioplasty with possible stenting of left VBJ;  Surgeon: Julieanne Cotton, MD;  Location: [facility] OR;  Service: Radiology;  Laterality: Left;   RADIOLOGY WITH ANESTHESIA N/A 02/25/2018   Procedure: STENT PLACEMENT;  Surgeon: Julieanne Cotton, MD;  Location: [facility] OR;  Service: Radiology;  Laterality: N/A;   UMBILICAL HERNIA REPAIR N/A 11/13/2012   Procedure: UMBILICAL HERNIORRHAPHY;  Surgeon: Dalia Heading, MD;  Location: [facility];  Service: General;  Laterality: N/A;   VARICOCELECTOMY      Family History: Family History  Problem Relation Age of Onset   Coronary artery disease Father    Hypertension Father    Hyperlipidemia Father    Diabetes Father    Congestive Heart Failure Father 37   Arrhythmia Father        had an ICD   Diabetes Mother    Hypertension Mother    Hyperlipidemia  Mother    Obesity Mother 61       died after bariatric surgery, liver failure and infection   Coronary artery disease Maternal Grandfather        both grandfathers and several uncles   Coronary artery disease Other    Diabetes Sister        both sisters   Stroke Maternal Aunt    Stroke Maternal Uncle  Social History: Social History   Tobacco Use  Smoking Status Every Day   Current packs/day: 1.00   Average packs/day: 1 pack/day for 37.7 years (37.7 ttl pk-yrs)   Types: Cigarettes   Start date: 03/13/1985   Passive exposure: Never  Smokeless Tobacco Former   Types: Snuff, Chew   Quit date: 03/14/1987   Social History   Substance and Sexual Activity  Alcohol Use No   Social History   Substance and Sexual Activity  Drug Use No    Allergies: Allergies  Allergen Reactions   Crestor [Rosuvastatin] Other (See Comments)    rhabdomyolosis   Trulicity [Dulaglutide] Nausea And Vomiting   Bee Venom Swelling    SWELLING REACTION UNSPECIFIED    Morphine     Panic attack    Medications: Current Outpatient Medications  Medication Sig Dispense Refill   aspirin 81 MG EC tablet Take 1 tablet (81 mg total) by mouth daily. With food 30 tablet 0   clopidogrel (PLAVIX) 75 MG tablet Take 1 tablet (75 mg total) by mouth daily. 30 tablet 1   Continuous Blood Gluc Receiver (FREESTYLE LIBRE 2 READER) DEVI USE AS DIRECTED 1 each 0   Continuous Blood Gluc Sensor (FREESTYLE LIBRE 2 SENSOR) MISC APPLY 1 PATCH TO SKIN ONCE EVERY 14 DAYS 2 each 2   EUTHYROX 200 MCG tablet Take 200 mcg by mouth at bedtime.     Evolocumab (REPATHA SURECLICK) 140 MG/ML SOAJ Inject 1 pen into the skin every 14 (fourteen) days. 1 mL 11   ezetimibe (ZETIA) 10 MG tablet Take 1 tablet (10 mg total) by mouth daily. 30 tablet 1   famotidine (PEPCID) 40 MG tablet Take 40 mg by mouth daily.     fenofibrate (TRICOR) 145 MG tablet Take 1 tablet (145 mg total) by mouth daily. 30 tablet 1   fluticasone (FLONASE) 50 MCG/ACT  nasal spray Place 2 sprays into both nostrils 2 (two) times daily.     furosemide (LASIX) 20 MG tablet Take 1 tablet (20 mg total) by mouth daily as needed. 30 tablet 6   gabapentin (NEURONTIN) 300 MG capsule One po q AM, one po q PM and 2 po qHS (Patient taking differently: Take 300 mg by mouth 2 (two) times daily.) 120 capsule 11   glucose blood (ACCU-CHEK GUIDE) test strip Test glucose 2 times a day 100 each 2   icosapent Ethyl (VASCEPA) 1 g capsule Take 2 capsules by mouth twice daily 360 capsule 0   Insulin Lispro Prot & Lispro (HUMALOG MIX 75/25 KWIKPEN) (75-25) 100 UNIT/ML Kwikpen Inject 100 Units into the skin 2 (two) times daily with a meal. 60 mL 2   Insulin Pen Needle (B-D ULTRAFINE III SHORT PEN) 31G X 8 MM MISC Use as directed to inject insulin 2 times daily. 200 each 1   levothyroxine (SYNTHROID) 88 MCG tablet Take 88 mcg by mouth daily.     nitroGLYCERIN (NITROSTAT) 0.4 MG SL tablet Place 1 tablet (0.4 mg total) under the tongue every 5 (five) minutes as needed for chest pain. 25 tablet 3   pantoprazole (PROTONIX) 40 MG tablet Take 1 tablet by mouth once daily 15 tablet 0   psyllium (METAMUCIL) 58.6 % packet Take 1 packet by mouth 2 (two) times daily. 60 packet 2   sitaGLIPtin (JANUVIA) 50 MG tablet Take 1 tablet (50 mg total) by mouth daily. 90 tablet 1   VENTOLIN HFA 108 (90 Base) MCG/ACT inhaler Inhale into the lungs.     docusate  sodium (COLACE) 100 MG capsule Take 100 mg by mouth daily. (Patient not taking: Reported on 11/07/2022)     DULERA 200-5 MCG/ACT AERO Inhale 2 puffs into the lungs 2 (two) times daily. (Patient not taking: Reported on 11/07/2022) 8.8 g 1   No current facility-administered medications for this visit.    Review of Systems: GENERAL: negative for malaise, night sweats HEENT: No changes in hearing or vision, no nose bleeds or other nasal problems. NECK: Negative for lumps, goiter, pain and significant neck swelling RESPIRATORY: Negative for cough,  wheezing CARDIOVASCULAR: Negative for chest pain, leg swelling, palpitations, orthopnea GI: SEE HPI MUSCULOSKELETAL: Negative for joint pain or swelling, back pain, and muscle pain. SKIN: Negative for lesions, rash HEMATOLOGY Negative for prolonged bleeding, bruising easily, and swollen nodes. ENDOCRINE: Negative for cold or heat intolerance, polyuria, polydipsia and goiter. NEURO: negative for tremor, gait imbalance, syncope and seizures. The remainder of the review of systems is noncontributory.   Physical Exam: BP 124/83 (BP Location: Left Arm, Patient Position: Sitting, Cuff Size: Normal)   Pulse 83   Temp 97.8 F (36.6 C) (Oral)   Ht 6\' 6"  (1.981 m)   Wt 221 lb 3.2 oz (100.3 kg)   BMI 25.56 kg/m  GENERAL: The patient is AO x3, in no acute distress. HEENT: Head is normocephalic and atraumatic. EOMI are intact. Mouth is well hydrated and without lesions. NECK: Supple. No masses LUNGS: Clear to auscultation. No presence of rhonchi/wheezing/rales. Adequate chest expansion HEART: RRR, normal s1 and s2. ABDOMEN: Soft, nontender, no guarding, no peritoneal signs, and nondistended. BS +. No masses.  EXTREMITIES: Without any cyanosis, clubbing, rash, lesions or edema. NEUROLOGIC: AOx3, no focal motor deficit. SKIN: no jaundice, no rashes   Imaging/Labs: as above  I personally reviewed and interpreted the available labs, imaging and endoscopic files.  2023 IMPRESSION: Bilateral nonobstructing renal calculi worse on the right than the left stable from the prior exam.   Fatty liver.  No acute abnormality noted.  Last liver enzymes normal from January 2024  Hemoglobin 15.6 platelet 266.  Hemoglobin A1c more than 15.5  Impression and Plan:  Devin [name] is a 52 y.o. 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  who presents for evaluation of Chronic diarrhea and unintentional weight loss.  #Unintentional weight loss #New Upper GI  symptoms  Patient reports of unintentional weight for past few months of at least 40-50 lbs.  Last recorded weight was 280 pounds today to 221 pounds  He is a current smoker with family history of colon cancer.  Patient is up-to-date to good quality colonoscopy with TI intubation done at [facility].  2 SSA 2 TA's and 1 HP suggested repeat 3 years as patient is due for colonoscopy 2025  Patient is a chronic cigarette smoker with sudden unintentional weight loss will need to rule out other malignancies such as pancreatic cancer.  CT abdomen pelvis with pancreatic protocol as indicated  Even though he has good appetite patient continues to have diarrhea which may also be playing a role in sudden weight  Also patient with hemoglobin A1c more than 15.5 this may need to diabetic enteropathy and malabsorption leading to diarrhea and weight loss  Also with new upper GI symptoms with excessive burping and nausea and sudden weight loss but also obtain diagnostic upper endoscopy.  Patient will be continued on aspirin and Plavix as he is high risk for recurrent strokes.  Discussed the risk and  benefit of continuing DAPT with the procedure with need of having the procedure done again if large lesion is found which will be removed.  May take random biopsies with much caution.  Patient and wife agree to continue with DAPT and proceed with diagnostic upper endoscopy   #Chronic diarrhea   Patient has more than 3 loose stools for more than 4 weeks hence qualifies for chronic diarrhea  This could be secondary to secretory , osmotic ,functional ,malabsorptive or inflammatory diarrhea  There is no correlation with food intake and hence patient is recommended keeping a food diary  Patient diet is low in fiber and recommended Metamucil starting 1 scoop daily for 1 week followed by 2 scoops daily second week, and 3 scoops daily thereafter, to bulk up stool  We will obtain blood work including CRP liver function thyroid  function fecal calprotectin, check for celiac disease with TTG IgA and total IgA, and obtain stool studies  Also patient with hemoglobin A1c more than 15.5 this may need to diabetic enteropathy and malabsorption leading to diarrhea and weight loss  Orders:   - TSH - Sodium, stool - Potassium, stool - H. pylori antigen, stool - GI Profile, Stool, PCR - Fecal leukocytes - Calprotectin, Fecal - C-reactive protein - C difficile Toxins A+B W/Rflx - Tissue Transglutaminase Abs,IgG,IgA - IgA  #Uncontrolled Diabetes Patient with hemoglobin A1c more than 15.5 and many comorbidities such as coronary artery disease and recurrent strokes.  Discussed in length the necessity to have adequate blood glucose control Patient will follow with PCP  Orders Placed This Encounter  Procedures   C difficile Toxins A+B W/Rflx   Calprotectin, Fecal   Fecal leukocytes   CT ABDOMEN PELVIS W WO CONTRAST   Sodium, stool   IgA   Tissue Transglutaminase Abs,IgG,IgA   C-reactive protein   Comprehensive metabolic panel   GI Profile, Stool, PCR   H. pylori antigen, stool   Potassium, stool   TSH      All questions were answered.      Vista Lawman, MD Gastroenterology and Hepatology [facility] Gastroenterology   This chart has been completed using [facility] Dictation software, and while attempts have been made to ensure accuracy , certain words and phrases may not be transcribed as intended

## 2022-11-07 NOTE — Patient Instructions (Addendum)
It was very nice to meet you today, as dicussed with will plan for the following :  1) Ct Scan 2) Upper endoscopy  3) Blood work and stool studies 4) Strict glucose control  5) Ensure adequate fluid intake: Aim for 8 glasses of water daily. Follow a high fiber diet Use Metamucil twice a day.

## 2022-11-07 NOTE — Telephone Encounter (Signed)
PA pending via [employer] for CT

## 2022-11-08 DIAGNOSIS — K529 Noninfective gastroenteritis and colitis, unspecified: Secondary | ICD-10-CM | POA: Diagnosis not present

## 2022-11-10 LAB — COMPREHENSIVE METABOLIC PANEL
ALT: 9 IU/L (ref 0–44)
AST: 9 IU/L (ref 0–40)
Albumin: 4.2 g/dL (ref 4.1–5.1)
Alkaline Phosphatase: 148 IU/L — ABNORMAL HIGH (ref 44–121)
BUN/Creatinine Ratio: 18 (ref 9–20)
BUN: 21 mg/dL (ref 6–24)
Bilirubin Total: 0.2 mg/dL (ref 0.0–1.2)
CO2: 21 mmol/L (ref 20–29)
Calcium: 9.8 mg/dL (ref 8.7–10.2)
Chloride: 97 mmol/L (ref 96–106)
Creatinine, Ser: 1.16 mg/dL (ref 0.76–1.27)
Globulin, Total: 2.5 g/dL (ref 1.5–4.5)
Glucose: 368 mg/dL — ABNORMAL HIGH (ref 70–99)
Potassium: 4.2 mmol/L (ref 3.5–5.2)
Sodium: 135 mmol/L (ref 134–144)
Total Protein: 6.7 g/dL (ref 6.0–8.5)
eGFR: 77 mL/min/{1.73_m2} (ref >59)

## 2022-11-10 LAB — GI PROFILE, STOOL, PCR

## 2022-11-10 LAB — TSH: TSH: 1.12 u[IU]/mL (ref 0.450–4.500)

## 2022-11-10 LAB — IGA: IgA/Immunoglobulin A, Serum: 147 mg/dL (ref 90–386)

## 2022-11-10 LAB — TISSUE TRANSGLUTAMINASE ABS,IGG,IGA
Tissue Transglut Ab: 3 U/mL (ref 0–5)
Transglutaminase IgA: <2 U/mL (ref 0–3)

## 2022-11-10 LAB — C-REACTIVE PROTEIN: CRP: 8 mg/L (ref 0–10)

## 2022-11-10 LAB — H. PYLORI ANTIGEN, STOOL: H pylori Ag, Stl: NEGATIVE

## 2022-11-11 LAB — POTASSIUM, STOOL: Potassium, Stl: 92 mmol/L

## 2022-11-11 LAB — CALPROTECTIN, FECAL: Calprotectin, Fecal: 64 ug/g (ref 0–120)

## 2022-11-11 LAB — SODIUM, STOOL: Sodium, Stl: 83 mmol/L

## 2022-11-12 HISTORY — PX: ESOPHAGOGASTRODUODENOSCOPY ENDOSCOPY: SHX5814

## 2022-11-12 LAB — C DIFFICILE, CYTOTOXIN B

## 2022-11-12 LAB — C DIFFICILE TOXINS A+B W/RFLX: C difficile Toxins A+B, EIA: NEGATIVE

## 2022-11-14 DIAGNOSIS — E1140 Type 2 diabetes mellitus with diabetic neuropathy, unspecified: Secondary | ICD-10-CM | POA: Diagnosis not present

## 2022-11-14 DIAGNOSIS — J449 Chronic obstructive pulmonary disease, unspecified: Secondary | ICD-10-CM | POA: Diagnosis not present

## 2022-11-14 DIAGNOSIS — E039 Hypothyroidism, unspecified: Secondary | ICD-10-CM | POA: Diagnosis not present

## 2022-11-14 DIAGNOSIS — F17200 Nicotine dependence, unspecified, uncomplicated: Secondary | ICD-10-CM | POA: Diagnosis not present

## 2022-11-14 DIAGNOSIS — I10 Essential (primary) hypertension: Secondary | ICD-10-CM | POA: Diagnosis not present

## 2022-11-14 DIAGNOSIS — E1159 Type 2 diabetes mellitus with other circulatory complications: Secondary | ICD-10-CM | POA: Diagnosis not present

## 2022-11-14 DIAGNOSIS — K219 Gastro-esophageal reflux disease without esophagitis: Secondary | ICD-10-CM | POA: Diagnosis not present

## 2022-11-14 DIAGNOSIS — E785 Hyperlipidemia, unspecified: Secondary | ICD-10-CM | POA: Diagnosis not present

## 2022-11-14 DIAGNOSIS — R519 Headache, unspecified: Secondary | ICD-10-CM | POA: Diagnosis not present

## 2022-11-14 DIAGNOSIS — I2510 Atherosclerotic heart disease of native coronary artery without angina pectoris: Secondary | ICD-10-CM | POA: Diagnosis not present

## 2022-11-14 LAB — FECAL LEUKOCYTES

## 2022-11-15 ENCOUNTER — Telehealth (INDEPENDENT_AMBULATORY_CARE_PROVIDER_SITE_OTHER): Payer: Self-pay | Admitting: Gastroenterology

## 2022-11-15 NOTE — Telephone Encounter (Signed)
Pt sister contacted with pre op appt (12/05/22@ 2:15pm

## 2022-11-20 NOTE — Progress Notes (Signed)
Hi Devin [name] ,  Can you please call the patient and tell the patient the lab work shows no infection in stool , normal celiac disease panel , just slightly elevated liver enzymes ( ALP), we can discuss further in the clinic appointment   Thanks,  Vista Lawman, MD Gastroenterology and Hepatology [facility] Gastroenterology --------------------  Normal CRP  ALP:148 Stool WBC negative  C Diff neagtive Stool electrolytes Na: 83 K: 92 Normal fecal calprotectin GI PCR negative Stool H Pylori negaive Normal celiac panel  TSH normal

## 2022-11-20 NOTE — Progress Notes (Signed)
Hi Toniann Fail thank you for calling the patient and sister   Yes please schedule the patient clinic with me in 4 weeks

## 2022-12-01 NOTE — Patient Instructions (Signed)
Devin [name]  12/01/2022     @PREFPERIOPPHARMACY @   Your procedure is scheduled on  12/07/2022.   [facility] to [name] at  0845 A.M.   Call this number if you have problems the morning of surgery:  [phone]  If you experience any cold or flu symptoms such as cough, fever, chills, shortness of breath, etc. between now and your scheduled surgery, please notify us at the above number.   Remember:  Follow the diet instructions given to you by the office.    Your last dose Januvia should have been on 12/03/2022.     Take 1/2 of your night time insulin the night before your procedure.       DO NOT take any medications for diabetes the morning of your procedure.      Hold your plavix for the period of time that Dr Regan Rakers office instructed you.     Take these medicines the morning of surgery with A SIP OF WATER         pepcid, gabapentin, levothyroxine, pantoprazole.     Do not wear jewelry, make-up or nail polish, including gel polish,  artificial nails, or any other type of covering on natural nails (fingers and  toes).  Do not wear lotions, powders, or perfumes, or deodorant.  Do not shave 48 hours prior to surgery.  Men may shave face and neck.  Do not bring valuables to the hospital.  [facility] is not responsible for any belongings or valuables.  Contacts, dentures or bridgework may not be worn into surgery.  Leave your suitcase in the car.  After surgery it may be brought to your room.  For patients admitted to the hospital, discharge time will be determined by your treatment team.  Patients discharged the day of surgery will not be allowed to drive home and must have someone with them for 24 hours.    Special instructions:   DO NOT smoke tobacco or vape for 24 hours before your procedure.  Please read over the following fact sheets that you were given. Anesthesia Post-op Instructions and Care and Recovery After Surgery       Upper Endoscopy, Adult,  Care After After the procedure, it is common to have a sore throat. It is also common to have: Mild stomach pain or discomfort. Bloating. Nausea. Follow these instructions at home: The instructions below may help you care for yourself at home. Your health care provider may give you more instructions. If you have questions, ask your health care provider. If you were given a sedative during the procedure, it can affect you for several hours. Do not drive or operate machinery until your health care provider says that it is safe. If you will be going home right after the procedure, plan to have a responsible adult: Take you home from the hospital or clinic. You will not be allowed to drive. Care for you for the time you are told. Follow instructions from your health care provider about what you may eat and drink. Return to your normal activities as told by your health care provider. Ask your health care provider what activities are safe for you. Take over-the-counter and prescription medicines only as told by your health care provider. Contact a health care provider if you: Have a sore throat that lasts longer than one day. Have trouble swallowing. Have a fever. Get help right away if you: Vomit blood or your vomit looks like coffee grounds.  Have bloody, black, or tarry stools. Have a very bad sore throat or you cannot swallow. Have difficulty breathing or very bad pain in your chest or abdomen. These symptoms may be an emergency. Get help right away. Call 911. Do not wait to see if the symptoms will go away. Do not drive yourself to the hospital. Summary After the procedure, it is common to have a sore throat, mild stomach discomfort, bloating, and nausea. If you were given a sedative during the procedure, it can affect you for several hours. Do not drive until your health care provider says that it is safe. Follow instructions from your health care provider about what you may eat and  drink. Return to your normal activities as told by your health care provider. This information is not intended to replace advice given to you by your health care provider. Make sure you discuss any questions you have with your health care provider. Document Revised: 06/08/2021 Document Reviewed: 06/08/2021 Elsevier Patient Education  2024 Else[employer] Monitored Anesthesia Care, Care After The following information offers guidance on how to care for yourself after your procedure. Your health care provider may also give you more specific instructions. If you have problems or questions, contact your health care provider. What can I expect after the procedure? After the procedure, it is common to have: Tiredness. Little or no memory about what happened during or after the procedure. Impaired judgment when it comes to making decisions. Nausea or vomiting. Some trouble with balance. Follow these instructions at home: For the time period you were told by your health care provider:  Rest. Do not participate in activities where you could fall or become injured. Do not drive or use machinery. Do not drink alcohol. Do not take sleeping pills or medicines that cause drowsiness. Do not make important decisions or sign legal documents. Do not take care of children on your own. Medicines Take over-the-counter and prescription medicines only as told by your health care provider. If you were prescribed antibiotics, take them as told by your health care provider. Do not stop using the antibiotic even if you start to feel better. Eating and drinking Follow instructions from your health care provider about what you may eat and drink. Drink enough fluid to keep your urine pale yellow. If you vomit: Drink clear fluids slowly and in small amounts as you are able. Clear fluids include water, ice chips, low-calorie sports drinks, and fruit juice that has water added to it (diluted fruit juice). Eat light  and bland foods in small amounts as you are able. These foods include bananas, applesauce, rice, lean meats, toast, and crackers. General instructions  Have a responsible adult stay with you for the time you are told. It is important to have someone help care for you until you are awake and alert. If you have sleep apnea, surgery and some medicines can increase your risk for breathing problems. Follow instructions from your health care provider about wearing your sleep device: When you are sleeping. This includes during daytime naps. While taking prescription pain medicines, sleeping medicines, or medicines that make you drowsy. Do not use any products that contain nicotine or tobacco. These products include cigarettes, chewing tobacco, and vaping devices, such as e-cigarettes. If you need help quitting, ask your health care provider. Contact a health care provider if: You feel nauseous or vomit every time you eat or drink. You feel light-headed. You are still sleepy or having trouble with balance after 24 hours.  You get a rash. You have a fever. You have redness or swelling around the IV site. Get help right away if: You have trouble breathing. You have new confusion after you get home. These symptoms may be an emergency. Get help right away. Call 911. Do not wait to see if the symptoms will go away. Do not drive yourself to the hospital. This information is not intended to replace advice given to you by your health care provider. Make sure you discuss any questions you have with your health care provider. Document Revised: 07/25/2021 Document Reviewed: 07/25/2021 Elsevier Patient Education  2024 [employer].

## 2022-12-04 NOTE — Pre-Procedure Instructions (Signed)
  RE: plavix Received: Today Marlowe Shores, LPN  Elsie Amis, RN Good morning. Per Dr.Ahmed   Patient will be continued on aspirin and Plavix as he is high risk for recurrent strokes.       Previous Messages    ----- Message ----- From: Elsie Amis, RN Sent: 12/01/2022   4:00 PM EDT To: Mindy S Estudillo, CMA; Marlowe Shores, LPN Subject: plavix                                        Good afternoon ! I did not see a note about Devin [name] holding his plavix pre-procedure. Does he need to hold that? If so, how long? Thank you!

## 2022-12-05 ENCOUNTER — Encounter (INDEPENDENT_AMBULATORY_CARE_PROVIDER_SITE_OTHER): Payer: Self-pay

## 2022-12-05 ENCOUNTER — Encounter (HOSPITAL_COMMUNITY): Payer: Self-pay

## 2022-12-05 ENCOUNTER — Encounter (HOSPITAL_COMMUNITY)
Admission: RE | Admit: 2022-12-05 | Discharge: 2022-12-05 | Disposition: A | Discharge status: Home / Self Care | Payer: Medicare HMO | Source: Ambulatory Visit | Attending: Gastroenterology | Admitting: Gastroenterology

## 2022-12-05 VITALS — BP 128/81 | HR 90 | Temp 98.4°F | Resp 18 | Ht 78.0 in | Wt 222.0 lb

## 2022-12-05 DIAGNOSIS — Z01812 Encounter for preprocedural laboratory examination: Secondary | ICD-10-CM | POA: Diagnosis not present

## 2022-12-05 DIAGNOSIS — E1100 Type 2 diabetes mellitus with hyperosmolarity without nonketotic hyperglycemic-hyperosmolar coma (NKHHC): Secondary | ICD-10-CM | POA: Insufficient documentation

## 2022-12-05 LAB — BASIC METABOLIC PANEL
Anion gap: 12 (ref 5–15)
BUN: 19 mg/dL (ref 6–20)
CO2: 23 mmol/L (ref 22–32)
Calcium: 9.5 mg/dL (ref 8.9–10.3)
Chloride: 97 mmol/L — ABNORMAL LOW (ref 98–111)
Creatinine, Ser: 0.88 mg/dL (ref 0.61–1.24)
GFR, Estimated: >60 mL/min (ref >60)
Glucose, Bld: 498 mg/dL — ABNORMAL HIGH (ref 70–99)
Potassium: 4.5 mmol/L (ref 3.5–5.1)
Sodium: 132 mmol/L — ABNORMAL LOW (ref 135–145)

## 2022-12-06 NOTE — Pre-Procedure Instructions (Signed)
Glucose 498 mg/dl on BMP from PAT visit. Dr. Johnnette Litter notified and advised that pt should take full dose of insulin the night before procedure. Pt called but unable to reach. Unable to leave message. Will try again this afternoon.

## 2022-12-07 ENCOUNTER — Ambulatory Visit (HOSPITAL_COMMUNITY)
Admission: RE | Admit: 2022-12-07 | Discharge: 2022-12-07 | Disposition: A | Discharge status: Home / Self Care | Payer: Medicare HMO | Attending: Gastroenterology | Admitting: Gastroenterology

## 2022-12-07 ENCOUNTER — Ambulatory Visit (HOSPITAL_COMMUNITY): Payer: Medicare HMO | Admitting: Certified Registered"

## 2022-12-07 ENCOUNTER — Encounter (HOSPITAL_COMMUNITY): Payer: Self-pay

## 2022-12-07 ENCOUNTER — Encounter (HOSPITAL_COMMUNITY)
Admission: RE | Disposition: A | Discharge status: Home / Self Care | Payer: Self-pay | Source: Home / Self Care | Attending: Gastroenterology

## 2022-12-07 ENCOUNTER — Ambulatory Visit (HOSPITAL_BASED_OUTPATIENT_CLINIC_OR_DEPARTMENT_OTHER): Payer: Medicare HMO | Admitting: Certified Registered"

## 2022-12-07 DIAGNOSIS — I130 Hypertensive heart and chronic kidney disease with heart failure and stage 1 through stage 4 chronic kidney disease, or unspecified chronic kidney disease: Secondary | ICD-10-CM | POA: Insufficient documentation

## 2022-12-07 DIAGNOSIS — E1122 Type 2 diabetes mellitus with diabetic chronic kidney disease: Secondary | ICD-10-CM | POA: Diagnosis not present

## 2022-12-07 DIAGNOSIS — R634 Abnormal weight loss: Secondary | ICD-10-CM | POA: Diagnosis not present

## 2022-12-07 DIAGNOSIS — K2270 Barrett's esophagus without dysplasia: Secondary | ICD-10-CM | POA: Insufficient documentation

## 2022-12-07 DIAGNOSIS — K529 Noninfective gastroenteritis and colitis, unspecified: Secondary | ICD-10-CM | POA: Insufficient documentation

## 2022-12-07 DIAGNOSIS — E039 Hypothyroidism, unspecified: Secondary | ICD-10-CM | POA: Insufficient documentation

## 2022-12-07 DIAGNOSIS — I2510 Atherosclerotic heart disease of native coronary artery without angina pectoris: Secondary | ICD-10-CM

## 2022-12-07 DIAGNOSIS — N189 Chronic kidney disease, unspecified: Secondary | ICD-10-CM | POA: Diagnosis not present

## 2022-12-07 DIAGNOSIS — K3189 Other diseases of stomach and duodenum: Secondary | ICD-10-CM | POA: Insufficient documentation

## 2022-12-07 DIAGNOSIS — I509 Heart failure, unspecified: Secondary | ICD-10-CM | POA: Insufficient documentation

## 2022-12-07 DIAGNOSIS — Z6825 Body mass index (BMI) 25.0-25.9, adult: Secondary | ICD-10-CM | POA: Diagnosis not present

## 2022-12-07 DIAGNOSIS — K2289 Other specified disease of esophagus: Secondary | ICD-10-CM | POA: Insufficient documentation

## 2022-12-07 DIAGNOSIS — K219 Gastro-esophageal reflux disease without esophagitis: Secondary | ICD-10-CM | POA: Diagnosis not present

## 2022-12-07 DIAGNOSIS — Z794 Long term (current) use of insulin: Secondary | ICD-10-CM | POA: Insufficient documentation

## 2022-12-07 DIAGNOSIS — K2980 Duodenitis without bleeding: Secondary | ICD-10-CM | POA: Diagnosis not present

## 2022-12-07 DIAGNOSIS — Z8673 Personal history of transient ischemic attack (TIA), and cerebral infarction without residual deficits: Secondary | ICD-10-CM | POA: Insufficient documentation

## 2022-12-07 DIAGNOSIS — Z7984 Long term (current) use of oral hypoglycemic drugs: Secondary | ICD-10-CM | POA: Insufficient documentation

## 2022-12-07 DIAGNOSIS — G4730 Sleep apnea, unspecified: Secondary | ICD-10-CM | POA: Insufficient documentation

## 2022-12-07 DIAGNOSIS — J45909 Unspecified asthma, uncomplicated: Secondary | ICD-10-CM | POA: Insufficient documentation

## 2022-12-07 DIAGNOSIS — R197 Diarrhea, unspecified: Secondary | ICD-10-CM | POA: Diagnosis not present

## 2022-12-07 DIAGNOSIS — K2990 Gastroduodenitis, unspecified, without bleeding: Secondary | ICD-10-CM | POA: Diagnosis not present

## 2022-12-07 DIAGNOSIS — F17210 Nicotine dependence, cigarettes, uncomplicated: Secondary | ICD-10-CM | POA: Diagnosis not present

## 2022-12-07 DIAGNOSIS — E119 Type 2 diabetes mellitus without complications: Secondary | ICD-10-CM | POA: Diagnosis not present

## 2022-12-07 DIAGNOSIS — K2950 Unspecified chronic gastritis without bleeding: Secondary | ICD-10-CM | POA: Diagnosis not present

## 2022-12-07 DIAGNOSIS — K2970 Gastritis, unspecified, without bleeding: Secondary | ICD-10-CM

## 2022-12-07 HISTORY — PX: BIOPSY: SHX5522

## 2022-12-07 HISTORY — PX: ESOPHAGOGASTRODUODENOSCOPY (EGD) WITH PROPOFOL: SHX5813

## 2022-12-07 LAB — GLUCOSE, CAPILLARY
Glucose-Capillary: 350 mg/dL — ABNORMAL HIGH (ref 70–99)
Glucose-Capillary: 293 mg/dL — ABNORMAL HIGH (ref 70–99)

## 2022-12-07 SURGERY — ESOPHAGOGASTRODUODENOSCOPY (EGD) WITH PROPOFOL
Anesthesia: General

## 2022-12-07 MED ORDER — LIDOCAINE HCL (CARDIAC) PF 100 MG/5ML IV SOSY
PREFILLED_SYRINGE | INTRAVENOUS | Status: DC | PRN
  Administered 2022-12-07: 50 mg via INTRAVENOUS

## 2022-12-07 MED ORDER — DEXMEDETOMIDINE HCL IN NACL 80 MCG/20ML IV SOLN
INTRAVENOUS | Status: AC
  Filled 2022-12-07: qty 20

## 2022-12-07 MED ORDER — DEXMEDETOMIDINE HCL IN NACL 80 MCG/20ML IV SOLN
INTRAVENOUS | Status: DC | PRN
  Administered 2022-12-07: 8 ug via INTRAVENOUS
  Administered 2022-12-07: 12 ug via INTRAVENOUS

## 2022-12-07 MED ORDER — PANTOPRAZOLE SODIUM 40 MG PO TBEC: 40.0000 mg | DELAYED_RELEASE_TABLET | Freq: Every day | ORAL | 2 refills | Status: DC

## 2022-12-07 MED ORDER — PHENYLEPHRINE 80 MCG/ML (10ML) SYRINGE FOR IV PUSH (FOR BLOOD PRESSURE SUPPORT)
PREFILLED_SYRINGE | INTRAVENOUS | Status: DC | PRN
Start: 2022-12-07 — End: 2022-12-07
  Administered 2022-12-07 (×2): 160 ug via INTRAVENOUS

## 2022-12-07 MED ORDER — PROPOFOL 10 MG/ML IV BOLUS
INTRAVENOUS | Status: DC | PRN
Start: 2022-12-07 — End: 2022-12-07
  Administered 2022-12-07: 100 mg via INTRAVENOUS

## 2022-12-07 MED ORDER — PROPOFOL 500 MG/50ML IV EMUL
INTRAVENOUS | Status: DC | PRN
  Administered 2022-12-07: 150 ug/kg/min via INTRAVENOUS

## 2022-12-07 MED ORDER — LACTATED RINGERS IV SOLN: INTRAVENOUS | Status: DC | PRN

## 2022-12-07 NOTE — Interval H&P Note (Signed)
History and Physical Interval Note:  12/07/2022 10:35 AM  [name]  has presented today for surgery, with the diagnosis of chronic diarrhea, weight loss.  The various methods of treatment have been discussed with the patient and family. After consideration of risks, benefits and other options for treatment, the patient has consented to  Procedure(s) with comments: ESOPHAGOGASTRODUODENOSCOPY (EGD) WITH PROPOFOL (N/A) - 9:45am;asa 3 as a surgical intervention.  The patient's history has been reviewed, patient examined, no change in status, stable for surgery.  I have reviewed the patient's chart and labs.  Questions were answered to the patient's satisfaction.     Devin [name] Lula Kolton

## 2022-12-07 NOTE — Anesthesia Preprocedure Evaluation (Signed)
Anesthesia Evaluation  Patient identified by MRN, date of birth, ID band Patient awake    Reviewed: Allergy & Precautions, H&P , NPO status , Patient's Chart, lab work & pertinent test results, reviewed documented beta blocker date and time   History of Anesthesia Complications (+) PONV and history of anesthetic complications  Airway Mallampati: II  TM Distance: >3 FB Neck ROM: full    Dental no notable dental hx. (+) Edentulous Lower, Edentulous Upper   Pulmonary neg pulmonary ROS, shortness of breath, asthma , sleep apnea , Current Smoker   Pulmonary exam normal breath sounds clear to auscultation       Cardiovascular Exercise Tolerance: Good hypertension, + CAD and +CHF  negative cardio ROS  Rhythm:regular Rate:Normal     Neuro/Psych  Headaches PSYCHIATRIC DISORDERS  Depression    TIA Neuromuscular disease CVA negative neurological ROS  negative psych ROS   GI/Hepatic negative GI ROS, Neg liver ROS,GERD  ,,  Endo/Other  negative endocrine ROSdiabetesHypothyroidism    Renal/GU Renal diseasenegative Renal ROS  negative genitourinary   Musculoskeletal   Abdominal   Peds  Hematology negative hematology ROS (+)   Anesthesia Other Findings   Reproductive/Obstetrics negative OB ROS                             Anesthesia Physical Anesthesia Plan  ASA: 3  Anesthesia Plan: General   Post-op Pain Management:    Induction:   PONV Risk Score and Plan: Propofol infusion  Airway Management Planned:   Additional Equipment:   Intra-op Plan:   Post-operative Plan:   Informed Consent: I have reviewed the patients History and Physical, chart, labs and discussed the procedure including the risks, benefits and alternatives for the proposed anesthesia with the patient or authorized representative who has indicated his/her understanding and acceptance.     Dental Advisory Given  Plan  Discussed with: CRNA  Anesthesia Plan Comments:        Anesthesia Quick Evaluation

## 2022-12-07 NOTE — Op Note (Signed)
[facility] Patient Name: Devin [name] Procedure Date: 12/07/2022 10:38 AM MRN: [id number] Date of Birth: [birth year] Attending MD: Sanjuan Dame , MD, [id number] CSN: [id number] Age: 52 Admit Type: Outpatient Procedure:                Upper GI endoscopy Indications:              Diarrhea, Weight loss Providers:                Sanjuan Dame, MD, Angelica Ran, Elinor Parkinson Referring MD:              Medicines:                Monitored Anesthesia Care Complications:            No immediate complications. Estimated Blood Loss:     Estimated blood loss was minimal. Procedure:                After obtaining informed consent, the endoscope was                            passed under direct vision. Throughout the                            procedure, the patient's blood pressure, pulse, and                            oxygen saturations were monitored continuously. The                            [id number]-H190 ([id number]) scope was introduced through the                            mouth, and advanced to the second part of duodenum.                            The upper GI endoscopy was accomplished without                            difficulty. The patient tolerated the procedure                            well. Scope In: 10:49:06 AM Scope Out: 11:04:12 AM Total Procedure Duration: 0 hours 15 minutes 6 seconds  Findings:      The Z-line was irregular. Biopsies were taken with a cold forceps for       histology.      Moderately erythematous mucosa without bleeding was found in the entire       examined stomach. Biopsies were taken with a cold forceps for histology.      Moderate inflammation characterized by erythema, friability and       nodularity was found in the duodenal bulb and in the second portion of       the duodenum. Biopsies were taken with a cold forceps for histology. Impression:               - Z-line irregular. Biopsied.                           -  Erythematous mucosa in  the stomach. Biopsied.                           - Duodenitis. Biopsied. Moderate Sedation:      Per Anesthesia Care Recommendation:           - Patient has a contact number available for                            emergencies. The signs and symptoms of potential                            delayed complications were discussed with the                            patient. Return to normal activities tomorrow.                            Written discharge instructions were provided to the                            patient.                           - Resume previous diet.                           - Continue present medications.                           - Await pathology results.                           - Repeat upper endoscopy for surveillance based on                            pathology results. Procedure Code(s):        --- Professional ---                           [phone], Esophagogastroduodenoscopy, flexible,                            transoral; with biopsy, single or multiple Diagnosis Code(s):        --- Professional ---                           K22.89, Other specified disease of esophagus                           K31.89, Other diseases of stomach and duodenum                           K29.80, Duodenitis without bleeding                           R19.7, Diarrhea, unspecified  R63.4, Abnormal weight loss CPT copyright 2022 American Medical Association. All rights reserved. The codes documented in this report are preliminary and upon coder review may  be revised to meet current compliance requirements. Sanjuan Dame, MD Sanjuan Dame, MD 12/07/2022 11:14:52 AM This report has been signed electronically. Number of Addenda: 0

## 2022-12-07 NOTE — Transfer of Care (Signed)
Immediate Anesthesia Transfer of Care Note  Patient: Devin [name]  Procedure(s) Performed: ESOPHAGOGASTRODUODENOSCOPY (EGD) WITH PROPOFOL BIOPSY  Patient Location: Short Stay  Anesthesia Type:General  Level of Consciousness: drowsy  Airway & Oxygen Therapy: Patient Spontanous Breathing  Post-op Assessment: Report given to RN and Post -op Vital signs reviewed and stable  Post vital signs: Reviewed and stable  Last Vitals:  Vitals Value Taken Time  BP    Temp    Pulse    Resp    SpO2      Last Pain:  Vitals:   12/07/22 1043  TempSrc:   PainSc: 0-No pain         Complications: No notable events documented.

## 2022-12-07 NOTE — Anesthesia Procedure Notes (Signed)
Date/Time: 12/07/2022 10:44 AM  Performed by: Julian Reil, CRNAPre-anesthesia Checklist: Patient identified, Emergency Drugs available, Suction available and Patient being monitored Patient Re-evaluated:Patient Re-evaluated prior to induction Oxygen Delivery Method: Nasal cannula Induction Type: IV induction Placement Confirmation: positive ETCO2

## 2022-12-08 LAB — SURGICAL PATHOLOGY

## 2022-12-09 NOTE — Anesthesia Postprocedure Evaluation (Signed)
Anesthesia Post Note  Patient: Devin [name]  Procedure(s) Performed: ESOPHAGOGASTRODUODENOSCOPY (EGD) WITH PROPOFOL BIOPSY  Patient location during evaluation: Phase II Anesthesia Type: General Level of consciousness: awake Pain management: pain level controlled Vital Signs Assessment: post-procedure vital signs reviewed and stable Respiratory status: spontaneous breathing and respiratory function stable Cardiovascular status: blood pressure returned to baseline and stable Postop Assessment: no headache and no apparent nausea or vomiting Anesthetic complications: no Comments: Late entry   No notable events documented.   Last Vitals:  Vitals:   12/07/22 0850 12/07/22 1108  BP: (!) 141/93 126/68  Pulse: 85 70  Resp: 20 18  Temp: 36.7 C 36.7 C  SpO2: 99% 100%    Last Pain:  Vitals:   12/08/22 1126  TempSrc:   PainSc: 0-No pain                 Windell Norfolk

## 2022-12-11 ENCOUNTER — Other Ambulatory Visit (HOSPITAL_COMMUNITY): Payer: Self-pay | Admitting: Gastroenterology

## 2022-12-11 MED ORDER — PANTOPRAZOLE SODIUM 40 MG PO TBEC: 40.0000 mg | DELAYED_RELEASE_TABLET | Freq: Every day | ORAL | 3 refills | Status: DC

## 2022-12-11 NOTE — Progress Notes (Signed)
I reviewed the pathology results. Ann, can you send her a letter with the findings as described below please? Repeat upper endoscopy in 3 years  Thanks,  Vista Lawman, MD Gastroenterology and Hepatology [facility]  ---------------------------------------------------------------------------------------------  [facility] [address], [address], [place], [place] [address] Phone:  [phone]   12/11/22 [name], [place]   Dear Devin [name],  I am writing to inform you that the biopsies taken during your recent endoscopic examination showed:  Barrett's esophagus   I am writing to let you know the results of your recent upper endoscopy.  The biopsies of your esophagus showed something called "Barrett's esophagus without dysplasia." It means that after years of acid reflux the esophagus cells changed into a kind that is at a slightly increased risk for turning cancer.  Because of this finding, we recommend that you take an acid blocking medication (Pantoprazole  40mg  once a day) to protect your esophagus. We also recommend that you have a repeat upper endoscopy in 3 years to check for cancer.  Please call us at [phone] if you have persistent problems or have questions about your condition that have not been fully answered at this time.  Sincerely,  Vista Lawman, MD Gastroenterology and Hepatology

## 2022-12-12 ENCOUNTER — Encounter (INDEPENDENT_AMBULATORY_CARE_PROVIDER_SITE_OTHER): Payer: Self-pay | Admitting: *Deleted

## 2022-12-14 ENCOUNTER — Ambulatory Visit: Payer: Medicare HMO | Admitting: "Endocrinology

## 2022-12-14 ENCOUNTER — Encounter: Payer: Self-pay | Admitting: "Endocrinology

## 2022-12-14 VITALS — BP 122/68 | HR 72 | Ht 78.0 in | Wt 227.2 lb

## 2022-12-14 DIAGNOSIS — E559 Vitamin D deficiency, unspecified: Secondary | ICD-10-CM | POA: Diagnosis not present

## 2022-12-14 DIAGNOSIS — E1159 Type 2 diabetes mellitus with other circulatory complications: Secondary | ICD-10-CM | POA: Diagnosis not present

## 2022-12-14 DIAGNOSIS — E782 Mixed hyperlipidemia: Secondary | ICD-10-CM | POA: Diagnosis not present

## 2022-12-14 DIAGNOSIS — I10 Essential (primary) hypertension: Secondary | ICD-10-CM

## 2022-12-14 DIAGNOSIS — E039 Hypothyroidism, unspecified: Secondary | ICD-10-CM

## 2022-12-14 DIAGNOSIS — Z794 Long term (current) use of insulin: Secondary | ICD-10-CM | POA: Diagnosis not present

## 2022-12-14 DIAGNOSIS — Z7984 Long term (current) use of oral hypoglycemic drugs: Secondary | ICD-10-CM | POA: Diagnosis not present

## 2022-12-14 DIAGNOSIS — Z91199 Patient's noncompliance with other medical treatment and regimen due to unspecified reason: Secondary | ICD-10-CM | POA: Diagnosis not present

## 2022-12-14 LAB — POCT GLYCOSYLATED HEMOGLOBIN (HGB A1C): HbA1c, POC (controlled diabetic range): 12.4 % — AB (ref 0.0–7.0)

## 2022-12-14 MED ORDER — EMPAGLIFLOZIN 10 MG PO TABS: 10.0000 mg | ORAL_TABLET | Freq: Every day | ORAL | 1 refills | Status: DC

## 2022-12-14 NOTE — Patient Instructions (Signed)
                                     Advice for Weight Management  -For most of us the best way to lose weight is by diet management. Generally speaking, diet management means consuming less calories intentionally which over time brings about progressive weight loss.  This can be achieved more effectively by avoiding ultra processed carbohydrates, processed meats, unhealthy fats.    It is critically important to know your numbers: how much calorie you are consuming and how much calorie you need. More importantly, our carbohydrates sources should be unprocessed naturally occurring  complex starch food items.  It is always important to balance nutrition also by  appropriate intake of proteins (mainly plant-based), healthy fats/oils, plenty of fruits and vegetables.   -The American College of Lifestyle Medicine (ACL M) recommends nutrition derived mostly from Whole Food, Plant Predominant Sources example an apple instead of applesauce or apple pie. Eat Plenty of vegetables, Mushrooms, fruits, Legumes, Whole Grains, Nuts, seeds in lieu of processed meats, processed snacks/pastries red meat, poultry, eggs.  Use only water or unsweetened tea for hydration.  The College also recommends the need to stay away from risky substances including alcohol, smoking; obtaining 7-9 hours of restorative sleep, at least 150 minutes of moderate intensity exercise weekly, importance of healthy social connections, and being mindful of stress and seek help when it is overwhelming.    -Sticking to a routine mealtime to eat 3 meals a day and avoiding unnecessary snacks is shown to have a big role in weight control. Under normal circumstances, the only time we burn stored energy is when we are hungry, so allow  some hunger to take place- hunger means no food between appropriate meal times, only water.  It is not advisable to starve.   -It is better to avoid simple carbohydrates including:  Cakes, Sweet Desserts, Ice Cream, Soda (diet and regular), Sweet Tea, Candies, Chips, Cookies, Store Bought Juices, Alcohol in Excess of  1-2 drinks a day, Lemonade,  Artificial Sweeteners, Doughnuts, Coffee Creamers, "Sugar-free" Products, etc, etc.  This is not a complete list.....    -Consulting with certified diabetes educators is proven to provide you with the most accurate and current information on diet.  Also, you may be  interested in discussing diet options/exchanges , we can schedule a visit with Devin [name], RDN, CDE for individualized nutrition education.  -Exercise: If you are able: 30 -60 minutes a day ,4 days a week, or 150 minutes of moderate intensity exercise weekly.    The longer the better if tolerated.  Combine stretch, strength, and aerobic activities.  If you were told in the past that you have high risk for cardiovascular diseases, or if you are currently symptomatic, you may seek evaluation by your heart doctor prior to initiating moderate to intense exercise programs.                                  Additional Care Considerations for Diabetes/Prediabetes   -Diabetes  is a chronic disease.  The most important care consideration is regular follow-up with your diabetes care provider with the goal being avoiding or delaying its complications and to take advantage of advances in medications and technology.  If appropriate actions are taken early enough, type 2 diabetes can even be   reversed.  Seek information from the right source.  - Whole Food, Plant Predominant Nutrition is highly recommended: Eat Plenty of vegetables, Mushrooms, fruits, Legumes, Whole Grains, Nuts, seeds in lieu of processed meats, processed snacks/pastries red meat, poultry, eggs as recommended by American College of  Lifestyle Medicine (ACLM).  -Type 2 diabetes is known to coexist with other important comorbidities such as high blood pressure and high cholesterol.  It is critical to control not only the  diabetes but also the high blood pressure and high cholesterol to minimize and delay the risk of complications including coronary artery disease, stroke, amputations, blindness, etc.  The good news is that this diet recommendation for type 2 diabetes is also very helpful for managing high cholesterol and high blood blood pressure.  - Studies showed that people with diabetes will benefit from a class of medications known as ACE inhibitors and statins.  Unless there are specific reasons not to be on these medications, the standard of care is to consider getting one from these groups of medications at an optimal doses.  These medications are generally considered safe and proven to help protect the heart and the kidneys.    - People with diabetes are encouraged to initiate and maintain regular follow-up with eye doctors, foot doctors, dentists , and if necessary heart and kidney doctors.     - It is highly recommended that people with diabetes quit smoking or stay away from smoking, and get yearly  flu vaccine and pneumonia vaccine at least every 5 years.  See above for additional recommendations on exercise, sleep, stress management , and healthy social connections.      

## 2022-12-14 NOTE — Progress Notes (Signed)
12/14/2022, 5:53 PM  Endocrinology follow-up note   Subjective:    Patient ID: Devin [name], male    DOB: 09-02-1970.  Devin [name] is being seen in follow-up after he was seen in consultation for management of currently uncontrolled symptomatic diabetes requested by  Donetta Potts, MD.    Past Medical History:  Diagnosis Date   Asthma    Chest pain    CHF (congestive heart failure) (HCC)    CKD (chronic kidney disease) 11/13/2017   Coronary artery disease    a. s/p DES x2 to LAD and DES to OM in 11/2017   Depression    Dyspnea    GERD (gastroesophageal reflux disease)    Headache    History of kidney stones    History of rhabdomyolysis    Hyperlipidemia    Hypertension    Hypothyroidism    Ischemic cardiomyopathy    a. 11/2017: echo showing EF of 35-40%, diffuse HK, and Grade 2 DD   Kidney calculus 2014   Pericardial effusion    Small, by dobutamine echocardiogram, 04/2006   PONV (postoperative nausea and vomiting)    Sleep apnea    does not wear cpap   Stroke (HCC)    Tobacco abuse    Type 2 diabetes mellitus without complications (HCC) 10/07/2013   Vitamin D deficiency     Past Surgical History:  Procedure Laterality Date   APPENDECTOMY     CARDIAC CATHETERIZATION  09/2012   "Nonobstructive CAD with 30% proximal, 40% mid LAD disease; 30% proximal CFX; EF 55-65%"   CHOLECYSTECTOMY N/A 01/27/2013   Procedure: LAPAROSCOPIC CHOLECYSTECTOMY;  Surgeon: Dalia Heading, MD;  Location: [facility];  Service: General;  Laterality: N/A;   CORONARY STENT INTERVENTION N/A 11/15/2017   Procedure: CORONARY STENT INTERVENTION;  Surgeon: Marykay Lex, MD;  Location: [facility] IN[facility]AS[facility] CV [facility];  Service: Cardiovascular;  Laterality: N/A;   ESOPHAGOGASTRODUODENOSCOPY ENDOSCOPY  11/2022   HERNIA REPAIR     As a child   INSERTION OF MESH N/A 11/13/2012   Procedure: INSERTION OF MESH;  Surgeon: Dalia Heading, MD;  Location: [facility];  Service: General;  Laterality: N/A;   IR ANGIO INTRA EXTRACRAN SEL COM CAROTID INNOMINATE BILAT MOD SED  12/01/2017   IR ANGIO INTRA EXTRACRAN SEL COM CAROTID INNOMINATE BILAT MOD SED  02/13/2018   IR ANGIO INTRA EXTRACRAN SEL COM CAROTID INNOMINATE BILAT MOD SED  09/15/2022   IR ANGIO VERTEBRAL SEL SUBCLAVIAN INNOMINATE BILAT MOD SED  09/15/2022   IR ANGIO VERTEBRAL SEL SUBCLAVIAN INNOMINATE UNI L MOD SED  02/13/2018   IR ANGIO VERTEBRAL SEL VERTEBRAL BILAT MOD SED  12/01/2017   IR ANGIO VERTEBRAL SEL VERTEBRAL UNI R MOD SED  02/13/2018   IR INTRA CRAN STENT  12/03/2017   IR PTA INTRACRANIAL  02/25/2018   IR RADIOLOGIST EVAL & MGMT  07/29/2021   IR US GUIDE VASC ACCESS RIGHT  02/13/2018   KIDNEY STONE SURGERY     LEFT HEART CATH AND CORONARY ANGIOGRAPHY N/A 11/15/2017   Procedure: LEFT HEART CATH AND CORONARY ANGIOGRAPHY;  Surgeon: Marykay Lex, MD;  Location: [facility] INVASIVE CV LAB;  Service: Cardiovascular;  Laterality: N/A;   PERCUTANEOUS NEPHROLITHOTRIPSY     RADIOLOGY WITH ANESTHESIA Left 12/03/2017   Procedure: Angioplasty with possible stenting of left VBJ;  Surgeon: Julieanne Cotton, MD;  Location: [facility] OR;  Service: Radiology;  Laterality: Left;   RADIOLOGY WITH ANESTHESIA N/A 02/25/2018   Procedure: STENT PLACEMENT;  Surgeon: Julieanne Cotton, MD;  Location: [facility] OR;  Service: Radiology;  Laterality: N/A;   UMBILICAL HERNIA REPAIR N/A 11/13/2012   Procedure: UMBILICAL HERNIORRHAPHY;  Surgeon: Dalia Heading, MD;  Location: [facility];  Service: General;  Laterality: N/A;   VARICOCELECTOMY      Social History   Socioeconomic History   Marital status: Divorced    Spouse name: Not on file   Number of children: Not on file   Years of education: Not on file   Highest education level: Not on file  Occupational History   Occupation: Disability     Comment:      Employer: [employer]  Tobacco Use   Smoking status: Every Day    Current packs/day:  1.00    Average packs/day: 1 pack/day for 37.8 years (37.8 ttl pk-yrs)    Types: Cigarettes    Start date: 03/13/1985    Passive exposure: Never   Smokeless tobacco: Former    Types: Snuff, Chew    Quit date: 03/14/1987  Vaping Use   Vaping status: Former   Devices: quit in 2011  Substance and Sexual Activity   Alcohol use: No   Drug use: No   Sexual activity: Not on file  Other Topics Concern   Not on file  Social History Narrative   Eats fast food [name]   No exercise outside of work   Lives in [place] with his dog   MAINTENANCE TECHNICIAN   Caffeine use: tea/soda [name]   Left handed    Social Determinants of Health   Financial Resource Strain: Not on file  Food Insecurity: Not on file  Transportation Needs: No Transportation Needs (09/07/2020)   Received from [facility], [facility]   PRAPARE - Transportation    In the past 12 months, has lack of transportation kept you from medical appointments or from getting medications?: No    Lack of Transportation (Non-Medical): No  Physical Activity: Not on file  Stress: Not on file  Social Connections: Unknown (01/06/2022)   Received from [facility], [facility]   Social Network    Social Network: Not on file    Family History  Problem Relation Age of Onset   Coronary artery disease Father    Hypertension Father    Hyperlipidemia Father    Diabetes Father    Congestive Heart Failure Father 38   Arrhythmia Father        had an ICD   Diabetes Mother    Hypertension Mother    Hyperlipidemia Mother    Obesity Mother 59       died after bariatric surgery, liver failure and infection   Coronary artery disease Maternal Grandfather        both grandfathers and several uncles   Coronary artery disease Other    Diabetes Sister        both sisters   Stroke Maternal Aunt    Stroke Maternal Uncle     Outpatient Encounter Medications as of 12/14/2022  Medication Sig   empagliflozin  (JARDIANCE) 10 MG TABS tablet Take 1 tablet (10 mg total) by mouth [name] before breakfast.   NOVOLOG  MIX 70/30 FLEXPEN (70-30) 100 UNIT/ML FlexPen Inject 80 Units into the skin 2 (two) times [name] with a meal.   aspirin 81 MG EC tablet Take 1 tablet (81 mg total) by mouth [name]. With food   clopidogrel (PLAVIX) 75 MG tablet Take 1 tablet (75 mg total) by mouth [name].   Continuous Blood Gluc Receiver (FREESTYLE LIBRE 2 READER) DEVI USE AS DIRECTED   Continuous Blood Gluc Sensor (FREESTYLE LIBRE 2 SENSOR) MISC APPLY 1 PATCH TO SKIN ONCE EVERY 14 DAYS   docusate sodium (COLACE) 100 MG capsule Take 100 mg by mouth [name]. (Patient not taking: Reported on 11/07/2022)   DULERA 200-5 MCG/ACT AERO Inhale 2 puffs into the lungs 2 (two) times [name]. (Patient not taking: Reported on 11/07/2022)   EUTHYROX 200 MCG tablet Take 200 mcg by mouth at bedtime.   Evolocumab (REPATHA SURECLICK) 140 MG/ML SOAJ Inject 1 pen into the skin every 14 (fourteen) days.   ezetimibe (ZETIA) 10 MG tablet Take 1 tablet (10 mg total) by mouth [name].   famotidine (PEPCID) 40 MG tablet Take 40 mg by mouth [name].   fenofibrate (TRICOR) 145 MG tablet Take 1 tablet (145 mg total) by mouth [name].   fluticasone (FLONASE) 50 MCG/ACT nasal spray Place 2 sprays into both nostrils 2 (two) times [name].   furosemide (LASIX) 20 MG tablet Take 1 tablet (20 mg total) by mouth [name] as needed.   gabapentin (NEURONTIN) 300 MG capsule One po q AM, one po q PM and 2 po qHS (Patient taking differently: Take 300 mg by mouth 2 (two) times [name].)   glucose blood (ACCU-CHEK GUIDE) test strip Test glucose 2 times a day   icosapent Ethyl (VASCEPA) 1 g capsule Take 2 capsules by mouth twice [name]   Insulin Pen Needle (B-D ULTRAFINE III SHORT PEN) 31G X 8 MM MISC Use as directed to inject insulin 2 times [name].   levothyroxine (SYNTHROID) 88 MCG tablet Take 88 mcg by mouth [name].   nitroGLYCERIN (NITROSTAT) 0.4 MG SL tablet Place 1 tablet (0.4 mg total) under  the tongue every 5 (five) minutes as needed for chest pain.   pantoprazole (PROTONIX) 40 MG tablet Take 1 tablet (40 mg total) by mouth [name].   psyllium (METAMUCIL) 58.6 % packet Take 1 packet by mouth 2 (two) times [name].   sitaGLIPtin (JANUVIA) 50 MG tablet Take 1 tablet (50 mg total) by mouth [name].   VENTOLIN HFA 108 (90 Base) MCG/ACT inhaler Inhale into the lungs.   [DISCONTINUED] Insulin Lispro Prot & Lispro (HUMALOG MIX 75/25 KWIKPEN) (75-25) 100 UNIT/ML Kwikpen Inject 100 Units into the skin 2 (two) times [name] with a meal.   No facility-administered encounter medications on file as of 12/14/2022.    ALLERGIES: Allergies  Allergen Reactions   Crestor [Rosuvastatin] Other (See Comments)    rhabdomyolosis   Trulicity [Dulaglutide] Nausea And Vomiting   Bee Venom Swelling    SWELLING REACTION UNSPECIFIED    Morphine     Panic attack    VACCINATION STATUS: Immunization History  Administered Date(s) Administered   Tdap 12/12/2015    Diabetes He presents for his follow-up diabetic visit. He has type 2 diabetes mellitus. Onset time: He was diagnosed at approximate age of 41 years. His disease course has been worsening. Hypoglycemia symptoms include nervousness/anxiousness, sweats and tremors. Pertinent negatives for hypoglycemia include no confusion, headaches, pallor or seizures. Associated symptoms include fatigue, foot paresthesias, polydipsia and polyuria. Pertinent negatives for diabetes include no chest pain, no polyphagia  and no weakness. There are no hypoglycemic complications. Symptoms are worsening. Diabetic complications include a CVA, heart disease, nephropathy and PVD. Risk factors for coronary artery disease include diabetes mellitus, dyslipidemia, family history, obesity, male sex, tobacco exposure, sedentary lifestyle and hypertension. Current diabetic treatment includes insulin injections and oral agent (dual therapy). He is compliant with treatment most of the time.  His weight is decreasing steadily. He is following a generally unhealthy diet. When asked about meal planning, he reported none. He has had a previous visit with a dietitian. He never participates in exercise. His home blood glucose trend is increasing steadily. His breakfast blood glucose range is generally >200 mg/dl. His lunch blood glucose range is generally >200 mg/dl. His dinner blood glucose range is generally >200 mg/dl. His bedtime blood glucose range is generally >200 mg/dl. His overall blood glucose range is >200 mg/dl. (He missed his appointment since May 2023.  He did not maintain control of his diabetes.  His A1c was greater than 15% in July 2024 when he was advised to return for follow-up.   He continues to wear his CGM showing 5% time in range, 10% level 1 hyperglycemia, 85% level 2 hyperglycemia.  He did not document any hypoglycemia recently.  His average blood glucose is 371.  He is accompanied to clinic by his sister today. ) An ACE inhibitor/angiotensin II receptor blocker is not being taken. He does not see a podiatrist.Eye exam is current.  Hyperlipidemia This is a chronic problem. The current episode started more than 1 year ago. The problem is uncontrolled. Recent lipid tests were reviewed and are high. Exacerbating diseases include chronic renal disease, diabetes, hypothyroidism and obesity. Factors aggravating his hyperlipidemia include fatty foods and smoking. Associated symptoms include myalgias. Pertinent negatives include no chest pain or shortness of breath. Current antihyperlipidemic treatment includes ezetimibe. Risk factors for coronary artery disease include dyslipidemia, diabetes mellitus, hypertension, male sex, family history, obesity and a sedentary lifestyle.  Hypertension This is a chronic problem. The current episode started more than 1 year ago. The problem has been waxing and waning since onset. The problem is controlled. Associated symptoms include sweats.  Pertinent negatives include no chest pain, headaches, neck pain, palpitations or shortness of breath. Agents associated with hypertension include thyroid hormones. Risk factors for coronary artery disease include diabetes mellitus, dyslipidemia, family history, obesity, male gender, sedentary lifestyle and smoking/tobacco exposure. Past treatments include diuretics. The current treatment provides mild improvement. Compliance problems include diet and exercise.  Hypertensive end-organ damage includes kidney disease, CAD/MI, CVA, heart failure and PVD. Identifiable causes of hypertension include chronic renal disease and a thyroid problem.  Thyroid Problem Presents for follow-up visit. Onset time: He has taken levothyroxine for at least 2 years. Symptoms include anxiety, fatigue and tremors. Patient reports no constipation, diarrhea or palpitations. The symptoms have been stable. Past treatments include levothyroxine. His past medical history is significant for diabetes, heart failure and hyperlipidemia. Risk factors include family history of goiter.     Review of systems  Constitutional: + Minimally fluctuating body weight,  current Body mass index is 26.26 kg/m. , + fatigue, no subjective hyperthermia, no subjective hypothermia   Objective:    Blood pressure 122/68, pulse 72, height 6\' 6"  (1.981 m), weight 227 lb 3.2 oz (103.1 kg).  BP Readings from Last 3 Encounters:  12/14/22 122/68  12/07/22 126/68  12/05/22 128/81    BP 122/68   Pulse 72   Ht 6\' 6"  (1.981 m)   Wt 227  lb 3.2 oz (103.1 kg)   BMI 26.26 kg/m   Wt Readings from Last 3 Encounters:  12/14/22 227 lb 3.2 oz (103.1 kg)  12/07/22 221 lb 12.5 oz (100.6 kg)  12/05/22 222 lb (100.7 kg)     Physical Exam- Limited  Constitutional:  Body mass index is 26.26 kg/m. , not in acute distress, normal state of mind   CMP ( most recent) CMP     Component Value Date/Time   NA 132 (L) 12/05/2022 1453   NA 135 11/07/2022 1200    K 4.5 12/05/2022 1453   CL 97 (L) 12/05/2022 1453   CO2 23 12/05/2022 1453   GLUCOSE 498 (H) 12/05/2022 1453   BUN 19 12/05/2022 1453   BUN 21 11/07/2022 1200   CREATININE 0.88 12/05/2022 1453   CALCIUM 9.5 12/05/2022 1453   PROT 6.7 11/07/2022 1200   ALBUMIN 4.2 11/07/2022 1200   AST 9 11/07/2022 1200   ALT 9 11/07/2022 1200   ALKPHOS 148 (H) 11/07/2022 1200   BILITOT 0.2 11/07/2022 1200   GFRNONAA >60 12/05/2022 1453   GFRAA >60 10/15/2019 0728     Diabetic Labs (most recent): Lab Results  Component Value Date   HGBA1C 12.4 (A) 12/14/2022   HGBA1C >15.5 (H) 09/15/2022   HGBA1C 14.7 (H) 10/15/2021   MICROALBUR 80 01/30/2020     Lipid Panel ( most recent) Lipid Panel     Component Value Date/Time   CHOL 232 (H) 06/13/2021 1344   TRIG 730 (HH) 06/13/2021 1344   HDL 26 (L) 06/13/2021 1344   CHOLHDL 8.9 (H) 06/13/2021 1344   CHOLHDL NOT REPORTED DUE TO HIGH TRIGLYCERIDES 10/15/2019 0728   VLDL UNABLE TO CALCULATE IF TRIGLYCERIDE OVER 400 mg/dL 78/46/9629 5284   LDLCALC 86 06/13/2021 1344   LDLDIRECT 143 (H) 12/23/2019 1511   LDLDIRECT UNABLE TO CALCULATE IF TRIGLYCERIDE IS >1293 mg/dL 13/24/4010 2725   LABVLDL 120 (H) 06/13/2021 1344      Lab Results  Component Value Date   TSH 1.120 11/07/2022   TSH 1.282 10/15/2021   TSH 0.753 06/13/2021   TSH 12.90 (A) 07/21/2020   TSH 29.00 (A) 04/05/2020   TSH 49.10 (A) 02/18/2020   TSH 83.261 (H) 10/15/2019   TSH 60.657 (H) 01/05/2018   TSH 84.910 (H) 11/13/2017   TSH >100.000 (H) 10/06/2013   FREET4 1.75 06/13/2021   FREET4 <0.25 (L) 10/15/2019   FREET4 0.24 (L) 10/06/2013      Assessment & Plan:   1) DM type 2 causing vascular disease (HCC)  - Devin [name] has currently uncontrolled symptomatic type 2 DM since 52 years of age. He diabetes care is complicated by nonadherence to medical management and follow-up.  He does have multiple comorbidities including hyperlipidemia, hypertension, chronic smoking.   He  missed his appointment since May 2023.  He did not maintain control of his diabetes.  His A1c was greater than 15% in July 2024 when he was advised to return for follow-up.   He continues to wear his CGM showing 5% time in range, 10% level 1 hyperglycemia, 85% level 2 hyperglycemia.  He did not document any hypoglycemia recently.  His average blood glucose is 371.  He is accompanied to clinic by his sister today.   - Recent labs reviewed.  - I had a long discussion with him about the progressive nature of diabetes and the pathology behind its complications.  -his diabetes is complicated by coronary artery disease, CVA, heart failure, peripheral artery disease,  obesity/sedentary life, smoking and he remains at exceedingly  high risk for more acute and chronic complications which include CAD, CVA, CKD, retinopathy, and neuropathy. These are all discussed in detail with him.  - Nutritional counseling repeated at each appointment due to patients tendency to fall back in to old habits.  - he acknowledges that there is a room for improvement in his food and drink choices. - Suggestion is made for him to avoid simple carbohydrates  from his diet including Cakes, Sweet Desserts, Ice Cream, Soda (diet and regular), Sweet Tea, Candies, Chips, Cookies, Store Bought Juices, Alcohol , Artificial Sweeteners,  Coffee Creamer, and "Sugar-free" Products, Lemonade. This will help patient to have more stable blood glucose profile and potentially avoid unintended weight gain.  The following Lifestyle Medicine recommendations according to American College of Lifestyle Medicine  [facility]) were discussed and and offered to patient and he  agrees to start the journey:  A. Whole Foods, Plant-Based Nutrition comprising of fruits and vegetables, plant-based proteins, whole-grain carbohydrates was discussed in detail with the patient.   A list for source of those nutrients were also provided to the patient.  Patient will use only  water or unsweetened tea for hydration. B.  The need to stay away from risky substances including alcohol, smoking; obtaining 7 to 9 hours of restorative sleep, at least 150 minutes of moderate intensity exercise weekly, the importance of healthy social connections,  and stress management techniques were discussed. C.  A full color page of  Calorie density of various food groups per pound showing examples of each food groups was provided to the patient.     - Patient is advised to stick to a routine mealtimes to eat 3 meals a day and avoid unnecessary snacks (to snack only to correct hypoglycemia).  - he will be scheduled with Norm Salt, RDN, CDE for diabetes education.  - I have approached him with the following individualized plan to manage  his diabetes and patient agrees:   -He has not engaged in proper monitoring and self-care.  In light of his presentation with chronic, severe hyperglycemia, he will be continued on multiple [name] injections of insulin in order for him to achieve control of diabetes to target.   His point-of-care A1c today is 12.4%, slightly improving from  > 15% in July 2024. -He could not follow instructions on basal/bolus insulin, will be continued on premixed insulin twice [name].   - Urging him to utilize his CGM properly for safe use of insulin, he is advised to increase his NovoLog 70/30 to 80 units with breakfast and 80 units with supper only if his blood glucose Premeal is greater than 90 mg per DL and only if he is eating. He is allowed to maintain his Januvia 100 mg p.o. [name].  He is a high risk for pancreatitis, cautious introduction of GLP-1 receptor agonist will be considered in the future. However, he will benefit from SGLT2 inhibitor class of medications.  I discussed and added Jardiance 10 mg p.o. [name] at breakfast.  Side effects and precautions discussed with him.    -He is encouraged to call clinic for readings less than 70 or above 300 for 3 tests in  a row.  - he is warned not to take insulin without proper monitoring per orders.  - Specific targets for  A1c;  LDL, HDL,  and Triglycerides were discussed with the patient.  2) Blood Pressure /Hypertension:   -His blood pressure is controlled to target.  he is advised to continue his current medications including Lasix 20 mg p.o. [name] with breakfast .  3) Lipids/Hyperlipidemia:    His most recent lipid panel showed LDL of 86 and triglycerides of 720.    He does not tolerate statins.  He is advised to continue his Zetia 10 mg p.o. [name] and Vascepa 2 g p.o. twice [name].  He is also on Repatha 140 mg subcutaneously  q  2 weeks  4)  Weight/Diet:  His Body mass index is 26.26 kg/m.  -He recently has unintended weight loss mainly due to gastroparesis.  He is not a candidate for major weight loss.   I discussed with him the fact that loss of 5 - 10% of his  current body weight will have the most impact on his diabetes management.  Exercise, and detailed carbohydrates information provided  -  detailed on discharge instructions.  5) Hypothyroidism She has questionable compliance.  His Euthyrox was lowered to 200 mcg p.o. [name] before breakfast in the interim.  He is advised on the same dose.     - We discussed about the correct intake of his thyroid hormone, on empty stomach at fasting, with water, separated by at least 30 minutes from breakfast and other medications,  and separated by more than 4 hours from calcium, iron, multivitamins, acid reflux medications (PPIs). -Patient is made aware of the fact that thyroid hormone replacement is needed for life, dose to be adjusted by periodic monitoring of thyroid function tests.   6) Chronic Care/Health Maintenance: -he is on ARB and did not tolerate statin medications and  is encouraged to initiate and continue to follow up with Ophthalmology, Dentist,  Podiatrist at least yearly or according to recommendations, and advised to  quit smoking. I have  recommended yearly flu vaccine and pneumonia vaccine at least every 5 years; moderate intensity exercise for up to 150 minutes weekly; and  sleep for at least 7 hours a day.  The patient was counseled on the dangers of tobacco use, and was advised to quit.  Reviewed strategies to maximize success, including removing cigarettes and smoking materials from environment.   - he is advised to maintain close follow up with Donetta Potts, MD for primary care needs, as well as his other providers for optimal and coordinated care.   I spent  41  minutes in the care of the patient today including review of labs from CMP, Lipids, Thyroid Function, Hematology (current and previous including abstractions from other facilities); face-to-face time discussing  his blood glucose readings/logs, discussing hypoglycemia and hyperglycemia episodes and symptoms, medications doses, his options of short and long term treatment based on the latest standards of care / guidelines;  discussion about incorporating lifestyle medicine;  and documenting the encounter. Risk reduction counseling performed per USPSTF guidelines to reduce cardiovascular risk factors.     Please refer to Patient Instructions for Blood Glucose Monitoring and Insulin/Medications Dosing Guide"  in media tab for additional information. Please  also refer to " Patient Self Inventory" in the Media  tab for reviewed elements of pertinent patient history.  Devin [name] participated in the discussions, expressed understanding, and voiced agreement with the above plans.  All questions were answered to his satisfaction. he is encouraged to contact clinic should he have any questions or concerns prior to his return visit.   Devin [name] participated in the discussions, expressed understanding, and voiced agreement with the above plans.  All questions were answered to  his satisfaction. he is encouraged to contact clinic should he have any questions or  concerns prior to his return visit.   Follow up plan: - Return in about 3 months (around 03/16/2023) for F/U with Pre-visit Labs, Meter/CGM/Logs, A1c here.  Marquis Lunch , MD [facility] [address] [place], [place] [address] Phone: [phone] Fax: [phone]  12/14/2022, 5:53 PM

## 2022-12-15 ENCOUNTER — Other Ambulatory Visit (HOSPITAL_COMMUNITY): Payer: Medicare HMO

## 2022-12-15 ENCOUNTER — Encounter (HOSPITAL_COMMUNITY): Payer: Self-pay | Admitting: Gastroenterology

## 2022-12-19 ENCOUNTER — Ambulatory Visit (INDEPENDENT_AMBULATORY_CARE_PROVIDER_SITE_OTHER): Payer: Medicare HMO | Admitting: Gastroenterology

## 2022-12-25 DIAGNOSIS — F17200 Nicotine dependence, unspecified, uncomplicated: Secondary | ICD-10-CM | POA: Diagnosis not present

## 2022-12-25 DIAGNOSIS — K219 Gastro-esophageal reflux disease without esophagitis: Secondary | ICD-10-CM | POA: Diagnosis not present

## 2022-12-25 DIAGNOSIS — I10 Essential (primary) hypertension: Secondary | ICD-10-CM | POA: Diagnosis not present

## 2022-12-25 DIAGNOSIS — E1159 Type 2 diabetes mellitus with other circulatory complications: Secondary | ICD-10-CM | POA: Diagnosis not present

## 2022-12-25 DIAGNOSIS — R519 Headache, unspecified: Secondary | ICD-10-CM | POA: Diagnosis not present

## 2022-12-25 DIAGNOSIS — R197 Diarrhea, unspecified: Secondary | ICD-10-CM | POA: Diagnosis not present

## 2022-12-25 DIAGNOSIS — I2510 Atherosclerotic heart disease of native coronary artery without angina pectoris: Secondary | ICD-10-CM | POA: Diagnosis not present

## 2022-12-25 DIAGNOSIS — E785 Hyperlipidemia, unspecified: Secondary | ICD-10-CM | POA: Diagnosis not present

## 2022-12-25 DIAGNOSIS — E1140 Type 2 diabetes mellitus with diabetic neuropathy, unspecified: Secondary | ICD-10-CM | POA: Diagnosis not present

## 2022-12-25 DIAGNOSIS — Z8673 Personal history of transient ischemic attack (TIA), and cerebral infarction without residual deficits: Secondary | ICD-10-CM | POA: Diagnosis not present

## 2022-12-25 DIAGNOSIS — J449 Chronic obstructive pulmonary disease, unspecified: Secondary | ICD-10-CM | POA: Diagnosis not present

## 2022-12-25 DIAGNOSIS — E039 Hypothyroidism, unspecified: Secondary | ICD-10-CM | POA: Diagnosis not present

## 2023-01-11 ENCOUNTER — Ambulatory Visit (HOSPITAL_COMMUNITY)
Admission: RE | Admit: 2023-01-11 | Discharge: 2023-01-11 | Disposition: A | Discharge status: Home / Self Care | Payer: Medicare HMO | Source: Ambulatory Visit | Attending: Gastroenterology | Admitting: Gastroenterology

## 2023-01-11 DIAGNOSIS — R634 Abnormal weight loss: Secondary | ICD-10-CM | POA: Diagnosis not present

## 2023-01-11 DIAGNOSIS — N200 Calculus of kidney: Secondary | ICD-10-CM | POA: Diagnosis not present

## 2023-01-11 MED ORDER — IOHEXOL 300 MG/ML  SOLN
100.0000 mL | Freq: Once | INTRAMUSCULAR | Status: AC | PRN
  Administered 2023-01-11: 100 mL via INTRAVENOUS

## 2023-01-15 DIAGNOSIS — E1165 Type 2 diabetes mellitus with hyperglycemia: Secondary | ICD-10-CM | POA: Diagnosis not present

## 2023-01-15 DIAGNOSIS — R739 Hyperglycemia, unspecified: Secondary | ICD-10-CM | POA: Diagnosis not present

## 2023-01-16 DIAGNOSIS — Z7984 Long term (current) use of oral hypoglycemic drugs: Secondary | ICD-10-CM | POA: Diagnosis not present

## 2023-01-16 DIAGNOSIS — Z7902 Long term (current) use of antithrombotics/antiplatelets: Secondary | ICD-10-CM | POA: Diagnosis not present

## 2023-01-16 DIAGNOSIS — E119 Type 2 diabetes mellitus without complications: Secondary | ICD-10-CM | POA: Diagnosis not present

## 2023-01-16 DIAGNOSIS — W010XXA Fall on same level from slipping, tripping and stumbling without subsequent striking against object, initial encounter: Secondary | ICD-10-CM | POA: Diagnosis not present

## 2023-01-16 DIAGNOSIS — F17200 Nicotine dependence, unspecified, uncomplicated: Secondary | ICD-10-CM | POA: Diagnosis not present

## 2023-01-16 DIAGNOSIS — Z79890 Hormone replacement therapy (postmenopausal): Secondary | ICD-10-CM | POA: Diagnosis not present

## 2023-01-16 DIAGNOSIS — M25511 Pain in right shoulder: Secondary | ICD-10-CM | POA: Diagnosis not present

## 2023-01-16 DIAGNOSIS — I2510 Atherosclerotic heart disease of native coronary artery without angina pectoris: Secondary | ICD-10-CM | POA: Diagnosis not present

## 2023-01-16 DIAGNOSIS — E039 Hypothyroidism, unspecified: Secondary | ICD-10-CM | POA: Diagnosis not present

## 2023-01-16 DIAGNOSIS — I10 Essential (primary) hypertension: Secondary | ICD-10-CM | POA: Diagnosis not present

## 2023-01-29 DIAGNOSIS — I10 Essential (primary) hypertension: Secondary | ICD-10-CM | POA: Diagnosis not present

## 2023-01-29 DIAGNOSIS — F17200 Nicotine dependence, unspecified, uncomplicated: Secondary | ICD-10-CM | POA: Diagnosis not present

## 2023-01-29 DIAGNOSIS — E1140 Type 2 diabetes mellitus with diabetic neuropathy, unspecified: Secondary | ICD-10-CM | POA: Diagnosis not present

## 2023-01-29 DIAGNOSIS — M25519 Pain in unspecified shoulder: Secondary | ICD-10-CM | POA: Diagnosis not present

## 2023-01-29 DIAGNOSIS — I2510 Atherosclerotic heart disease of native coronary artery without angina pectoris: Secondary | ICD-10-CM | POA: Diagnosis not present

## 2023-01-29 DIAGNOSIS — J449 Chronic obstructive pulmonary disease, unspecified: Secondary | ICD-10-CM | POA: Diagnosis not present

## 2023-01-29 DIAGNOSIS — R197 Diarrhea, unspecified: Secondary | ICD-10-CM | POA: Diagnosis not present

## 2023-01-29 DIAGNOSIS — E785 Hyperlipidemia, unspecified: Secondary | ICD-10-CM | POA: Diagnosis not present

## 2023-01-29 DIAGNOSIS — R519 Headache, unspecified: Secondary | ICD-10-CM | POA: Diagnosis not present

## 2023-01-29 DIAGNOSIS — E039 Hypothyroidism, unspecified: Secondary | ICD-10-CM | POA: Diagnosis not present

## 2023-01-29 DIAGNOSIS — G720 Drug-induced myopathy: Secondary | ICD-10-CM | POA: Diagnosis not present

## 2023-01-29 DIAGNOSIS — E1159 Type 2 diabetes mellitus with other circulatory complications: Secondary | ICD-10-CM | POA: Diagnosis not present

## 2023-02-01 NOTE — Progress Notes (Signed)
Hi Wendy ,  Can you please call the patient and tell the patient the CT of the abdomen pelvis did not show any evidence of mass or cancer.  This is a good news.  It shows tiny kidney stone, I advised keeping hydrated and following up with primary care regarding this kidney stones  Thanks,  Vista Lawman, MD Gastroenterology and Hepatology [facility]

## 2023-02-07 ENCOUNTER — Encounter (INDEPENDENT_AMBULATORY_CARE_PROVIDER_SITE_OTHER): Payer: Self-pay | Admitting: *Deleted

## 2023-02-16 ENCOUNTER — Encounter (INDEPENDENT_AMBULATORY_CARE_PROVIDER_SITE_OTHER): Payer: Self-pay | Admitting: Gastroenterology

## 2023-02-16 ENCOUNTER — Telehealth (INDEPENDENT_AMBULATORY_CARE_PROVIDER_SITE_OTHER): Payer: Self-pay | Admitting: Gastroenterology

## 2023-02-16 ENCOUNTER — Ambulatory Visit (INDEPENDENT_AMBULATORY_CARE_PROVIDER_SITE_OTHER): Payer: Medicare HMO | Admitting: Gastroenterology

## 2023-02-16 VITALS — BP 138/90 | HR 78 | Temp 98.5°F | Ht 78.0 in | Wt 237.6 lb

## 2023-02-16 DIAGNOSIS — R634 Abnormal weight loss: Secondary | ICD-10-CM

## 2023-02-16 DIAGNOSIS — K2270 Barrett's esophagus without dysplasia: Secondary | ICD-10-CM

## 2023-02-16 DIAGNOSIS — Z794 Long term (current) use of insulin: Secondary | ICD-10-CM | POA: Diagnosis not present

## 2023-02-16 DIAGNOSIS — E1110 Type 2 diabetes mellitus with ketoacidosis without coma: Secondary | ICD-10-CM | POA: Diagnosis not present

## 2023-02-16 DIAGNOSIS — R748 Abnormal levels of other serum enzymes: Secondary | ICD-10-CM | POA: Diagnosis not present

## 2023-02-16 DIAGNOSIS — K529 Noninfective gastroenteritis and colitis, unspecified: Secondary | ICD-10-CM

## 2023-02-16 NOTE — Patient Instructions (Signed)
It was very nice to meet you today, as dicussed with will plan for the following :  1) metamucil twice daily , immodium as needed  2) colonoscopy

## 2023-02-16 NOTE — Progress Notes (Signed)
Vista Lawman , M.D. Gastroenterology & Hepatology [facility] [address] [place], [place] [address] Primary Care Physician: Donetta Potts, MD [address] [place] [address]  Chief Complaint: Chronic diarrhea, unintentional weight loss, history of colonic polyp, Barrett's esophagus  History of Present Illness: Devin [name] is a 52 y.o. male with History of MI on DAPT , carotid stenosis with stents, CHF, CAD, GERD, uncontrolled diabetes who presents for evaluation of Chronic diarrhea, unintentional weight loss, history of colonic polyp, Barrett's esophagus.  Patient reports he is having 3-4 bowel movements daily on Bristol stool scale 6 and 7. Denies any abdominal pain   Although he reports he has good appetite patient has lost significant weight.  Reports of losing 50 pounds which is unintentional weight loss despite eating adequately.  Patient continues to smoke.  Has a family history of colon cancer and grandmother and 2 of the uncles The patient denies having any nausea, vomiting, fever, chills, hematochezia, melena, hematemesis,  jaundice, pruritus.  Patient is trying to get his sugar control but is going to see possibly an endocrinologist uncontrolled diabetes  Last EGD: 11/2022  - Z- line irregular. Biopsied. - Erythematous mucosa in the stomach. Biopsied. - Duodenitis. Biopsied.   Benign duodenal mucosa with no diagnostic abnormalities   B. STOMACH, RANDOM, BIOPSY:  Reactive gastropathy with minimal chronic gastritis  Negative for H. pylori, intestinal metaplasia, dysplasia and carcinoma   C. ESOPHAGUS, BIOPSY:  Compatible Barrett's esophagus  Reactive squamous mucosa  Minimal chronic gastritis with extensive intestinal metaplasia  Negative for dysplasia and carcinoma   Last Colonoscopy:Colonoscopy:06/2020   The perianal and digital rectal examinations were normal. Pertinent       negatives include normal  sphincter tone.       Hemorrhoids were found on perianal exam.       Retroflexion in the right colon was performed.       Eight sessile polyps were found at 30 cm proximal to the anus, at 45 cm       proximal to the anus, at 50 cm proximal to the anus, at 100 cm proximal       to the anus, at 120 cm proximal to the anus and at 130 cm proximal to       the anus. The polyps were 5 to 8 mm in size. These polyps were removed       with a hot snare. Resection and retrieval were complete. Verification of       patient identification for the specimen was done by the nurse using the       patient's name and medical record number. Estimated blood loss: none.       The exam was otherwise without abnormality on direct and retroflexion       views.    Repeat :3-5 years    Path: 2 SSA 2TA 1HP Hemoglobin 15.6 platelet 266.  Hemoglobin A1c more than 15.5-->12   FHx: Has a family history of colon cancer in grandmother and 2 of the uncles Social: Current severe smoker, no  alcohol or illicit drug use Surgical: no abdominal surgeries  Past Medical History: Past Medical History:  Diagnosis Date   Asthma    Chest pain    CHF (congestive heart failure) (HCC)    CKD (chronic kidney disease) 11/13/2017   Coronary artery disease    a. s/p DES x2 to LAD and DES to OM in 11/2017   Depression  Dyspnea    GERD (gastroesophageal reflux disease)    Headache    History of kidney stones    History of rhabdomyolysis    Hyperlipidemia    Hypertension    Hypothyroidism    Ischemic cardiomyopathy    a. 11/2017: echo showing EF of 35-40%, diffuse HK, and Grade 2 DD   Kidney calculus 2014   Pericardial effusion    Small, by dobutamine echocardiogram, 04/2006   PONV (postoperative nausea and vomiting)    Sleep apnea    does not wear cpap   Stroke (HCC)    Tobacco abuse    Type 2 diabetes mellitus without complications (HCC) 10/07/2013   Vitamin D deficiency     Past Surgical History: Past  Surgical History:  Procedure Laterality Date   APPENDECTOMY     BIOPSY  12/07/2022   Procedure: BIOPSY;  Surgeon: Franky Macho, MD;  [facility]: [facility];  Service: Endoscopy;;   CARDIAC CATHETERIZATION  09/2012   "Nonobstructive CAD with 30% proximal, 40% mid LAD disease; 30% proximal CFX; EF 55-65%"   CHOLECYSTECTOMY N/A 01/27/2013   Procedure: LAPAROSCOPIC CHOLECYSTECTOMY;  Surgeon: Dalia Heading, MD;  Location: [facility] ORS;  Service: General;  Laterality: N/A;   CORONARY STENT INTERVENTION N/A 11/15/2017   Procedure: CORONARY STENT INTERVENTION;  Surgeon: Marykay Lex, MD;  Location: [facility] INVASIVE CV LAB;  Service: Cardiovascular;  Laterality: N/A;   ESOPHAGOGASTRODUODENOSCOPY (EGD) WITH PROPOFOL N/A 12/07/2022   Procedure: ESOPHAGOGASTRODUODENOSCOPY (EGD) WITH PROPOFOL;  Surgeon: Franky Macho, MD;  Location: [facility];  Service: Endoscopy;  Laterality: N/A;  9:45am;asa 3   ESOPHAGOGASTRODUODENOSCOPY ENDOSCOPY  11/2022   HERNIA REPAIR     As a child   INSERTION OF MESH N/A 11/13/2012   Procedure: INSERTION OF MESH;  Surgeon: Dalia Heading, MD;  Location: [facility] ORS;  Service: General;  Laterality: N/A;   IR ANGIO INTRA EXTRACRAN SEL COM CAROTID INNOMINATE BILAT MOD SED  12/01/2017   IR ANGIO INTRA EXTRACRAN SEL COM CAROTID INNOMINATE BILAT MOD SED  02/13/2018   IR ANGIO INTRA EXTRACRAN SEL COM CAROTID INNOMINATE BILAT MOD SED  09/15/2022   IR ANGIO VERTEBRAL SEL SUBCLAVIAN INNOMINATE BILAT MOD SED  09/15/2022   IR ANGIO VERTEBRAL SEL SUBCLAVIAN INNOMINATE UNI L MOD SED  02/13/2018   IR ANGIO VERTEBRAL SEL VERTEBRAL BILAT MOD SED  12/01/2017   IR ANGIO VERTEBRAL SEL VERTEBRAL UNI R MOD SED  02/13/2018   IR INTRA CRAN STENT  12/03/2017   IR PTA INTRACRANIAL  02/25/2018   IR RADIOLOGIST EVAL & MGMT  07/29/2021   IR US GUIDE VASC ACCESS RIGHT  02/13/2018   KIDNEY STONE SURGERY     LEFT HEART CATH AND CORONARY ANGIOGRAPHY N/A 11/15/2017   Procedure: LEFT HEART CATH AND  CORONARY ANGIOGRAPHY;  Surgeon: Marykay Lex, MD;  [facility] INVASIVE CV LAB;  Service: Cardiovascular;  Laterality: N/A;   PERCUTANEOUS NEPHROLITHOTRIPSY     RADIOLOGY WITH ANESTHESIA Left 12/03/2017   Procedure: Angioplasty with possible stenting of left VBJ;  Surgeon: Julieanne Cotton, MD;  Location: [facility] OR;  Service: Radiology;  Laterality: Left;   RADIOLOGY WITH ANESTHESIA N/A 02/25/2018   Procedure: STENT PLACEMENT;  Surgeon: Julieanne Cotton, MD;  Location[facility];  Service: Radiology;  Laterality: N/A;   UMBILICAL HERNIA REPAIR N/A 11/13/2012   Procedure: UMBILICAL HERNIORRHAPHY;  Surgeon: Dalia Heading, MD;  Location: [facility];  Service: General;  Laterality: N/A;   VARICOCELECTOMY      Family  History: Family History  Problem Relation Age of Onset   Coronary artery disease Father    Hypertension Father    Hyperlipidemia Father    Diabetes Father    Congestive Heart Failure Father 69   Arrhythmia Father        had an ICD   Diabetes Mother    Hypertension Mother    Hyperlipidemia Mother    Obesity Mother 2       died after bariatric surgery, liver failure and infection   Coronary artery disease Maternal Grandfather        both grandfathers and several uncles   Coronary artery disease Other    Diabetes Sister        both sisters   Stroke Maternal Aunt    Stroke Maternal Uncle     Social History: Social History   Tobacco Use  Smoking Status Every Day   Current packs/day: 1.00   Average packs/day: 1 pack/day for 37.9 years (37.9 ttl pk-yrs)   Types: Cigarettes   Start date: 03/13/1985   Passive exposure: Never  Smokeless Tobacco Former   Types: Snuff, Chew   Quit date: 03/14/1987   Social History   Substance and Sexual Activity  Alcohol Use No   Social History   Substance and Sexual Activity  Drug Use No    Allergies: Allergies  Allergen Reactions   Crestor [Rosuvastatin] Other (See Comments)    rhabdomyolosis   Trulicity [Dulaglutide] Nausea  And Vomiting   Bee Venom Swelling    SWELLING REACTION UNSPECIFIED    Morphine     Panic attack    Medications: Current Outpatient Medications  Medication Sig Dispense Refill   aspirin 81 MG EC tablet Take 1 tablet (81 mg total) by mouth daily. With food 30 tablet 0   clopidogrel (PLAVIX) 75 MG tablet Take 1 tablet (75 mg total) by mouth daily. 30 tablet 1   Continuous Blood Gluc Receiver (FREESTYLE LIBRE 2 READER) DEVI USE AS DIRECTED 1 each 0   Continuous Blood Gluc Sensor (FREESTYLE LIBRE 2 SENSOR) MISC APPLY 1 PATCH TO SKIN ONCE EVERY 14 DAYS 2 each 2   empagliflozin (JARDIANCE) 10 MG TABS tablet Take 1 tablet (10 mg total) by mouth daily before breakfast. 90 tablet 1   EUTHYROX 200 MCG tablet Take 200 mcg by mouth at bedtime.     Evolocumab (REPATHA SURECLICK) 140 MG/ML SOAJ Inject 1 pen into the skin every 14 (fourteen) days. 1 mL 11   ezetimibe (ZETIA) 10 MG tablet Take 1 tablet (10 mg total) by mouth daily. 30 tablet 1   famotidine (PEPCID) 40 MG tablet Take 40 mg by mouth daily.     fenofibrate (TRICOR) 145 MG tablet Take 1 tablet (145 mg total) by mouth daily. 30 tablet 1   fluticasone (FLONASE) 50 MCG/ACT nasal spray Place 2 sprays into both nostrils 2 (two) times daily.     furosemide (LASIX) 20 MG tablet Take 1 tablet (20 mg total) by mouth daily as needed. 30 tablet 6   gabapentin (NEURONTIN) 300 MG capsule One po q AM, one po q PM and 2 po qHS (Patient taking differently: Take 300 mg by mouth 2 (two) times daily.) 120 capsule 11   glucose blood (ACCU-CHEK GUIDE) test strip Test glucose 2 times a day 100 each 2   icosapent Ethyl (VASCEPA) 1 g capsule Take 2 capsules by mouth twice daily 360 capsule 0   Insulin Pen Needle (B-D ULTRAFINE III SHORT PEN) 31G X 8  MM MISC Use as directed to inject insulin 2 times daily. 200 each 1   LANTUS SOLOSTAR 100 UNIT/ML Solostar Pen Inject 60 Units into the skin daily.     levothyroxine (SYNTHROID) 88 MCG tablet Take 88 mcg by mouth daily.      nitroGLYCERIN (NITROSTAT) 0.4 MG SL tablet Place 1 tablet (0.4 mg total) under the tongue every 5 (five) minutes as needed for chest pain. 25 tablet 3   NOVOLOG FLEXPEN 100 UNIT/ML FlexPen Inject into the skin 3 (three) times daily with meals. 20 units with meals     pantoprazole (PROTONIX) 40 MG tablet Take 1 tablet (40 mg total) by mouth daily. 30 tablet 3   sitaGLIPtin (JANUVIA) 50 MG tablet Take 1 tablet (50 mg total) by mouth daily. 90 tablet 1   VENTOLIN HFA 108 (90 Base) MCG/ACT inhaler Inhale into the lungs.     No current facility-administered medications for this visit.    Review of Systems: GENERAL: negative for malaise, night sweats HEENT: No changes in hearing or vision, no nose bleeds or other nasal problems. NECK: Negative for lumps, goiter, pain and significant neck swelling RESPIRATORY: Negative for cough, wheezing CARDIOVASCULAR: Negative for chest pain, leg swelling, palpitations, orthopnea GI: SEE HPI MUSCULOSKELETAL: Negative for joint pain or swelling, back pain, and muscle pain. SKIN: Negative for lesions, rash HEMATOLOGY Negative for prolonged bleeding, bruising easily, and swollen nodes. ENDOCRINE: Negative for cold or heat intolerance, polyuria, polydipsia and goiter. NEURO: negative for tremor, gait imbalance, syncope and seizures. The remainder of the review of systems is noncontributory.   Physical Exam: BP (!) 138/90   Pulse 78   Temp 98.5 F (36.9 C) (Oral)   Ht 6\' 6"  (1.981 m)   Wt 237 lb 9.6 oz (107.8 kg)   BMI 27.46 kg/m  GENERAL: The patient is AO x3, in no acute distress. HEENT: Head is normocephalic and atraumatic. EOMI are intact. Mouth is well hydrated and without lesions. NECK: Supple. No masses LUNGS: Clear to auscultation. No presence of rhonchi/wheezing/rales. Adequate chest expansion HEART: RRR, normal s1 and s2. ABDOMEN: Soft, nontender, no guarding, no peritoneal signs, and nondistended. BS +. No masses.  Imaging/Labs: as  above     Latest Ref Rng & Units 10/04/2022    4:06 AM 09/15/2022    7:21 AM 08/18/2022    1:22 PM  CBC  WBC 4.0 - 10.5 K/uL 9.7  10.1  11.1   Hemoglobin 13.0 - 17.0 g/dL 16.1  09.6  04.5   Hematocrit 39.0 - 52.0 % 48.7  43.6  51.8   Platelets 150 - 400 K/uL 313  266  278    Lab Results  Component Value Date   FERRITIN 336 10/17/2021    I personally reviewed and interpreted the available labs, imaging and endoscopic files.  Normal CRP  ALP:148 Stool WBC negative  C Diff neagtive Stool electrolytes Na: 83 K: 92 Normal fecal calprotectin GI PCR negative Stool H Pylori negaive Normal celiac panel  TSH normal   CT abdomen pelvis with contrast 12/2022 IMPRESSION: No acute findings. No radiographic evidence of malignancy.   Tiny bilateral renal calculi. No evidence of ureteral calculi or hydronephrosis.  Impression and Plan:  Devin [name] is a 52 y.o. male with History of MI on DAPT , carotid stenosis with stents, CHF, CAD, GERD, uncontrolled diabetes who presents for evaluation of Chronic diarrhea, unintentional weight loss, history of colonic polyp, Barrett's esophagus.  #Unintentional weight loss #Chronic diarrhea  Patient continues  to have unintentional weight loss and diarrhea.  All of patient symptoms may be explained by uncontrolled diabetes with previous hemoglobin A1c of 15 now over 12 which can lead to diabetic enteropathy, exocrine pancreatic insufficiency, malabsorption and weight loss.  So far workup for unintentional weight loss negative upper endoscopy and negative CT abdomen pelvis for malignancy.  Patient is up-to-date to colonoscopy there is due for another 1 month as he has a history of multiple colonic polyps in 2022 suggest repeat 3 years.   So far workup of chronic diarrhea negative for infectious etiology, negative celiac disease, normal TSH, normal fecal calprotectin, normal CRP  Stool electrolytes sodium 83 potassium 92 which gives [name] osmotic gap  Less than 50 suggesting secretory diarrhea  Causes of secretory diarrhea in this patient may be bile acid malabsorption, inflammatory conditions such as microscopic colitis or inflammatory bowel disease, gastrinoma or VIPOMA  Recommendations:  Surveillance colonoscopy given history of multiple colonic polyps.  Will plan to get random colonic biopsies to assess for microscopic colitis and terminal ileum intubation to assess for any pathology  Discussed with patient the importance of getting blood sugar under control; he plans to see an endocrinologist  Metamucil twice daily and Imodium as needed  Will check VIP peptide, gastrin level, alpha C4 level, stool osmolality  -Fecal elastase to assess for exocrine pancreatic insufficiency ; will give a trail of CREON after collecting this sample.  #Barrett esophagus  Colonoscopy 01/2023 biopsies consistent with Barrett's esophagus  Continue Protonix 40 mg daily Repeat surveillance upper endoscopy in 3 years Smoking cessation advised  #Uncontrolled Diabetes Patient with hemoglobin A1c more than 12 and many comorbidities such as coronary artery disease and recurrent strokes.  Discussed in length the necessity to have adequate blood glucose control.Patient will follow with PCP and endocrinologist   #Surveillance colonoscopy   Last colonoscopy 2022 suggest repeat 3 years, 2 SSA 2 TA and 1 HP  All questions were answered.      Vista Lawman, MD Gastroenterology and Hepatology [facility]   This chart has been completed using [facility] Dictation software, and while attempts have been made to ensure accuracy , certain words and phrases may not be transcribed as intended

## 2023-02-16 NOTE — Telephone Encounter (Signed)
1st attempt to reach pt regarding surgical clearance and the need for an in-office appointment.  Left a message for pt to call back.

## 2023-02-16 NOTE — Telephone Encounter (Signed)
   Name: Devin [name]  DOB: Mar 22, 1970  MRN: [id number]  Primary Cardiologist: Dina Rich, MD  Chart reviewed as part of pre-operative protocol coverage. Because of Devin [name]'s past medical history and time since last visit, he will require a follow-up in-office visit in order to better assess preoperative cardiovascular risk.  Pre-op covering staff: - Please schedule appointment and call patient to inform them. If patient already had an upcoming appointment within acceptable timeframe, please add "pre-op clearance" to the appointment notes so provider is aware. - Please contact requesting surgeon's office via preferred method (i.e, phone, fax) to inform them of need for appointment prior to surgery.  Plavix hold.   Roe Rutherford Kimi Bordeau, PA  02/16/2023, 11:32 AM

## 2023-02-16 NOTE — Telephone Encounter (Signed)
    02/16/23  [name] [birth year]  What type of surgery is being performed? COLONOSCOPY  When is surgery scheduled? 03/15/23  What type of clearance is required (medical or pharmacy to hold medication or both? CARDIAC CLEARANCE  Are there any medications that need to be held prior to surgery and how long? CLEARANCE TO HOLD PLAVIX FOR 5 DAYS PRIOR   Name of physician performing surgery?  Dr. Starleen Arms [facility]enterology at [facility] Phone: [phone] Fax: [phone]  Anethesia type (none, local, MAC, general)? MAC

## 2023-02-19 ENCOUNTER — Other Ambulatory Visit (INDEPENDENT_AMBULATORY_CARE_PROVIDER_SITE_OTHER): Payer: Self-pay | Admitting: *Deleted

## 2023-02-19 ENCOUNTER — Telehealth (INDEPENDENT_AMBULATORY_CARE_PROVIDER_SITE_OTHER): Payer: Self-pay | Admitting: *Deleted

## 2023-02-19 DIAGNOSIS — M7501 Adhesive capsulitis of right shoulder: Secondary | ICD-10-CM | POA: Diagnosis not present

## 2023-02-19 DIAGNOSIS — K529 Noninfective gastroenteritis and colitis, unspecified: Secondary | ICD-10-CM

## 2023-02-19 DIAGNOSIS — M7521 Bicipital tendinitis, right shoulder: Secondary | ICD-10-CM | POA: Diagnosis not present

## 2023-02-19 DIAGNOSIS — M75101 Unspecified rotator cuff tear or rupture of right shoulder, not specified as traumatic: Secondary | ICD-10-CM | POA: Diagnosis not present

## 2023-02-19 DIAGNOSIS — M25511 Pain in right shoulder: Secondary | ICD-10-CM | POA: Diagnosis not present

## 2023-02-19 MED ORDER — PEG 3350-KCL-NA BICARB-NACL 420 G PO SOLR: 4000.0000 mL | Freq: Once | ORAL | 0 refills | Status: AC

## 2023-02-19 NOTE — Telephone Encounter (Signed)
Spoke with patient and aware of  appointment needed and [facility] scheduling will contact him. [employer].

## 2023-02-19 NOTE — Addendum Note (Signed)
Addended by: Marlowe Shores on: 02/19/2023 08:23 AM   Modules accepted: Orders

## 2023-02-19 NOTE — Telephone Encounter (Signed)
Per Dr. Tasia Catchings. Pt needs to pick up order for additional stool test and also give samples of creon to take 3 with each meal.  I left message to return call to let pt know.

## 2023-02-19 NOTE — Telephone Encounter (Signed)
Pt has appt 02/20/23 with DR. Branch 10 am. I will update all parties involved.

## 2023-02-20 ENCOUNTER — Ambulatory Visit: Payer: Medicare HMO | Attending: Cardiology | Admitting: Cardiology

## 2023-02-20 ENCOUNTER — Encounter: Payer: Self-pay | Admitting: Cardiology

## 2023-02-20 VITALS — BP 102/64 | HR 81 | Ht 78.0 in | Wt 237.6 lb

## 2023-02-20 DIAGNOSIS — I2510 Atherosclerotic heart disease of native coronary artery without angina pectoris: Secondary | ICD-10-CM | POA: Diagnosis not present

## 2023-02-20 DIAGNOSIS — E782 Mixed hyperlipidemia: Secondary | ICD-10-CM

## 2023-02-20 DIAGNOSIS — Z79899 Other long term (current) drug therapy: Secondary | ICD-10-CM

## 2023-02-20 DIAGNOSIS — R0602 Shortness of breath: Secondary | ICD-10-CM | POA: Diagnosis not present

## 2023-02-20 NOTE — Patient Instructions (Signed)
Medication Instructions:   Continue all current medications.   Labwork:  FLP - order given today Reminder:  Nothing to eat or drink after 12 midnight prior to labs.  Testing/Procedures:  Your physician has requested that you have an echocardiogram. Echocardiography is a painless test that uses sound waves to create images of your heart. It provides your doctor with information about the size and shape of your heart and how well your heart's chambers and valves are working. This procedure takes approximately one hour. There are no restrictions for this procedure. Please do NOT wear cologne, perfume, aftershave, or lotions (deodorant is allowed). Please arrive 15 minutes prior to your appointment time.  Please note: We ask at that you not bring children with you during ultrasound (echo/ vascular) testing. Due to room size and safety concerns, children are not allowed in the ultrasound rooms during exams. Our front office staff cannot provide observation of children in our lobby area while testing is being conducted. An adult accompanying a patient to their appointment will only be allowed in the ultrasound room at the discretion of the ultrasound technician under special circumstances. We apologize for any inconvenience.  Follow-Up:  Office will contact with results via phone, letter or mychart.    3 months   Any Other Special Instructions Will Be Listed Below (If Applicable).   If you need a refill on your cardiac medications before your next appointment, please call your pharmacy.

## 2023-02-20 NOTE — Progress Notes (Signed)
Clinical Summary Devin [name] is a 52 y.o.male seen today for follow up of the following medical problems.        1. CAD/Chest pain/Chronic systolic HF - admitted 11/2017 with chest pain concerning for unstable angina.  11/2017 coronary CTA with severe prox LAD disease and possible mid LAD disease and D1.   11/2017 cath LM patent, LAD mid 80 to 90%, LCX distal 90%, OM2 90%, LPDA 90%, RCA small with prox 99% LVgram 35-45% (no echo on file). DES x 2 to LAD, DES to OM,  trifurcation lesion distal LCX, LPL and PDA recs for medical therapy, poor pci target. RCA small vessel treated medically.    Interventional cards recommended extended DAPT    01/2018 echo: LVEF 35-40%, grade I diastoluc dysfunction  - bradycardia on beta blocketers. History of rhabdo on crestor. Low bp and dizzienss on entresto.       10/2018 nuclear stress: no clear ischemia. LVEF 46%  10/2019 echo  LVEF 50-55%, no WMAs, indet diastolic fxn, nomral RV   - some recent SOB/DOE. Symptoms 15-20 yards. No recent edema. Symptoms started about 1 month ago. Chronic cough and wheezing. Has prn albuterol which can help at times. Still smoking.  - no recent chest pains  - cannot run on treadmill due to chronic left sided weakness from prior CVA    2. COPD - abnormal PFTs 02/2019, moderate obstruction -  followed by pcp - still smoking      3. Hyperlipidemia - history of rhabdo on crestor - previously extraordinarily high TGs in setting of poorly controlled DM2, hypothyroidism  -followed in lipid clinic. On repatha, zetia, fenofirbate, vascepa 12/2019 TC 235 TG 321 HDL 25 LDL 150. Direct LDL 143     06/2021 TC 232 TG 730 HDL 26 LDL 86 10/15/21 HgbA1c 14.7  12/2021 TC 196 TG 502 HDL 23 LDL unable to calculate - 62/9528 HgbA1c 12.4     4. History of CVA - history of CVA 11/2017 - had underwent an image-guided diagnostic cerebral angiogram 12/01/2017 with Dr. 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 He then underwent an image-guided cerebral angiogram with revascularization of his vertebrobasilar junction/proximal basilar artery stenosis using stent assisted angioplasty 12/03/2017 by Dr. Corliss Skains - later had ISR requiring repeat procedure 02/2018       4. Chronic diarrhea/unintentional weight loss - followed by GI  5.Preoperative evaluation - plans for colonscopy, would need to hold plavix    SH: has 2 boxer dogs at home, 11 and 3. Oldest dog passed recently, now just has one boxer Past Medical History:  Diagnosis Date   Asthma    Chest pain    CHF (congestive heart failure) (HCC)    CKD (chronic kidney disease) 11/13/2017   Coronary artery disease    a. s/p DES x2 to LAD and DES to OM in 11/2017   Depression    Dyspnea    GERD (gastroesophageal reflux disease)    Headache    History of kidney stones    History of rhabdomyolysis    Hyperlipidemia    Hypertension    Hypothyroidism    Ischemic cardiomyopathy    a. 11/2017: echo showing EF of 35-40%, diffuse HK, and Grade 2 DD   Kidney calculus 2014   Pericardial effusion    Small, by dobutamine echocardiogram, 04/2006   PONV (postoperative nausea and vomiting)    Sleep apnea    does not wear cpap  Stroke [facility])    Tobacco abuse    Type 2 diabetes mellitus without complications (HCC) 10/07/2013   Vitamin D deficiency      Allergies  Allergen Reactions   Crestor [Rosuvastatin] Other (See Comments)    rhabdomyolosis   Trulicity [Dulaglutide] Nausea And Vomiting   Bee Venom Swelling    SWELLING REACTION UNSPECIFIED    Morphine     Panic attack     Current Outpatient Medications  Medication Sig Dispense Refill   aspirin 81 MG EC tablet Take 1 tablet (81 mg total) by mouth daily. With food 30 tablet 0   clopidogrel (PLAVIX) 75 MG tablet Take 1 tablet (75 mg total) by mouth daily. 30 tablet 1   Continuous Blood Gluc Receiver (FREESTYLE LIBRE 2 READER) DEVI USE  AS DIRECTED 1 each 0   Continuous Blood Gluc Sensor (FREESTYLE LIBRE 2 SENSOR) MISC APPLY 1 PATCH TO SKIN ONCE EVERY 14 DAYS 2 each 2   empagliflozin (JARDIANCE) 10 MG TABS tablet Take 1 tablet (10 mg total) by mouth daily before breakfast. 90 tablet 1   EUTHYROX 200 MCG tablet Take 200 mcg by mouth at bedtime.     Evolocumab (REPATHA SURECLICK) 140 MG/ML SOAJ Inject 1 pen into the skin every 14 (fourteen) days. 1 mL 11   ezetimibe (ZETIA) 10 MG tablet Take 1 tablet (10 mg total) by mouth daily. 30 tablet 1   famotidine (PEPCID) 40 MG tablet Take 40 mg by mouth daily.     fenofibrate (TRICOR) 145 MG tablet Take 1 tablet (145 mg total) by mouth daily. 30 tablet 1   fluticasone (FLONASE) 50 MCG/ACT nasal spray Place 2 sprays into both nostrils 2 (two) times daily.     furosemide (LASIX) 20 MG tablet Take 1 tablet (20 mg total) by mouth daily as needed. 30 tablet 6   gabapentin (NEURONTIN) 300 MG capsule One po q AM, one po q PM and 2 po qHS (Patient taking differently: Take 300 mg by mouth 2 (two) times daily.) 120 capsule 11   glucose blood (ACCU-CHEK GUIDE) test strip Test glucose 2 times a day 100 each 2   icosapent Ethyl (VASCEPA) 1 g capsule Take 2 capsules by mouth twice daily 360 capsule 0   Insulin Pen Needle (B-D ULTRAFINE III SHORT PEN) 31G X 8 MM MISC Use as directed to inject insulin 2 times daily. 200 each 1   LANTUS SOLOSTAR 100 UNIT/ML Solostar Pen Inject 60 Units into the skin daily.     levothyroxine (SYNTHROID) 88 MCG tablet Take 88 mcg by mouth daily.     lipase/protease/amylase (CREON) 36000 UNITS CPEP capsule Take 36,000 Units by mouth. Samples given - Take 3 with meals     nitroGLYCERIN (NITROSTAT) 0.4 MG SL tablet Place 1 tablet (0.4 mg total) under the tongue every 5 (five) minutes as needed for chest pain. 25 tablet 3   NOVOLOG FLEXPEN 100 UNIT/ML FlexPen Inject into the skin 3 (three) times daily with meals. 20 units with meals     pantoprazole (PROTONIX) 40 MG tablet Take  1 tablet (40 mg total) by mouth daily. 30 tablet 3   sitaGLIPtin (JANUVIA) 50 MG tablet Take 1 tablet (50 mg total) by mouth daily. 90 tablet 1   VENTOLIN HFA 108 (90 Base) MCG/ACT inhaler Inhale into the lungs.     No current facility-administered medications for this visit.     Past Surgical History:  Procedure Laterality Date   APPENDECTOMY     BIOPSY  12/07/2022   Procedure: BIOPSY;  Surgeon: Franky Macho, MD;  Location: [facility];  Service: Endoscopy;;   CARDIAC CATHETERIZATION  09/2012   "Nonobstructive CAD with 30% proximal, 40% mid LAD disease; 30% proximal CFX; EF 55-65%"   CHOLECYSTECTOMY N/A 01/27/2013   Procedure: LAPAROSCOPIC CHOLECYSTECTOMY;  Surgeon: Dalia Heading, MD;  Location: [facility];  Service: General;  Laterality: N/A;   CORONARY STENT INTERVENTION N/A 11/15/2017   Procedure: CORONARY STENT INTERVENTION;  Surgeon: Marykay Lex, MD;  Location: [facility] INVASIVE CV LAB;  Service: Cardiovascular;  Laterality: N/A;   ESOPHAGOGASTRODUODENOSCOPY (EGD) WITH PROPOFOL N/A 12/07/2022   Procedure: ESOPHAGOGASTRODUODENOSCOPY (EGD) WITH PROPOFOL;  Surgeon: Franky Macho, MD;  Location: [facility];  Service: Endoscopy;  Laterality: N/A;  9:45am;asa 3   ESOPHAGOGASTRODUODENOSCOPY ENDOSCOPY  11/2022   HERNIA REPAIR     As a child   INSERTION OF MESH N/A 11/13/2012   Procedure: INSERTION OF MESH;  Surgeon: Dalia Heading, MD;  Location: [facility];  Service: General;  Laterality: N/A;   IR ANGIO INTRA EXTRACRAN SEL COM CAROTID INNOMINATE BILAT MOD SED  12/01/2017   IR ANGIO INTRA EXTRACRAN SEL COM CAROTID INNOMINATE BILAT MOD SED  02/13/2018   IR ANGIO INTRA EXTRACRAN SEL COM CAROTID INNOMINATE BILAT MOD SED  09/15/2022   IR ANGIO VERTEBRAL SEL SUBCLAVIAN INNOMINATE BILAT MOD SED  09/15/2022   IR ANGIO VERTEBRAL SEL SUBCLAVIAN INNOMINATE UNI L MOD SED  02/13/2018   IR ANGIO VERTEBRAL SEL VERTEBRAL BILAT MOD SED  12/01/2017   IR ANGIO VERTEBRAL SEL VERTEBRAL UNI R MOD SED   02/13/2018   IR INTRA CRAN STENT  12/03/2017   IR PTA INTRACRANIAL  02/25/2018   IR RADIOLOGIST EVAL & MGMT  07/29/2021   IR US GUIDE VASC ACCESS RIGHT  02/13/2018   KIDNEY STONE SURGERY     LEFT HEART CATH AND CORONARY ANGIOGRAPHY N/A 11/15/2017   Procedure: LEFT HEART CATH AND CORONARY ANGIOGRAPHY;  Surgeon: Marykay Lex, MD;  Location: [facility] INVASIVE CV LAB;  Service: Cardiovascular;  Laterality: N/A;   PERCUTANEOUS NEPHROLITHOTRIPSY     RADIOLOGY WITH ANESTHESIA Left 12/03/2017   Procedure: Angioplasty with possible stenting of left VBJ;  Surgeon: Julieanne Cotton, MD;  Location: [facility] OR;  Service: Radiology;  Laterality: Left;   RADIOLOGY WITH ANESTHESIA N/A 02/25/2018   Procedure: STENT PLACEMENT;  Surgeon: Julieanne Cotton, MD;  Location: [facility] OR;  Service: Radiology;  Laterality: N/A;   UMBILICAL HERNIA REPAIR N/A 11/13/2012   Procedure: UMBILICAL HERNIORRHAPHY;  Surgeon: Dalia Heading, MD;  Location: [facility];  Service: General;  Laterality: N/A;   VARICOCELECTOMY       Allergies  Allergen Reactions   Crestor [Rosuvastatin] Other (See Comments)    rhabdomyolosis   Trulicity [Dulaglutide] Nausea And Vomiting   Bee Venom Swelling    SWELLING REACTION UNSPECIFIED    Morphine     Panic attack      Family History  Problem Relation Age of Onset   Coronary artery disease Father    Hypertension Father    Hyperlipidemia Father    Diabetes Father    Congestive Heart Failure Father 22   Arrhythmia Father        had an ICD   Diabetes Mother    Hypertension Mother    Hyperlipidemia Mother    Obesity Mother 66       died after bariatric surgery, liver failure and infection   Coronary artery disease Maternal Grandfather  both grandfathers and several uncles   Coronary artery disease Other    Diabetes Sister        both sisters   Stroke Maternal Aunt    Stroke Maternal Uncle      Social History Mr. Strouse reports that he has been smoking cigarettes. He started  smoking about 37 years ago. He has a 37.9 pack-year smoking history. He has never been exposed to tobacco smoke. He quit smokeless tobacco use about 35 years ago.  His smokeless tobacco use included snuff and chew. Mr. Jones reports no history of alcohol use.   Review of Systems CONSTITUTIONAL: No weight loss, fever, chills, weakness or fatigue.  HEENT: Eyes: No visual loss, blurred vision, double vision or yellow sclerae.No hearing loss, sneezing, congestion, runny nose or sore throat.  SKIN: No rash or itching.  CARDIOVASCULAR: per hpi RESPIRATORY: No shortness of breath, cough or sputum.  GASTROINTESTINAL: No anorexia, nausea, vomiting or diarrhea. No abdominal pain or blood.  GENITOURINARY: No burning on urination, no polyuria NEUROLOGICAL: No headache, dizziness, syncope, paralysis, ataxia, numbness or tingling in the extremities. No change in bowel or bladder control.  MUSCULOSKELETAL: No muscle, back pain, joint pain or stiffness.  LYMPHATICS: No enlarged nodes. No history of splenectomy.  PSYCHIATRIC: No history of depression or anxiety.  ENDOCRINOLOGIC: No reports of sweating, cold or heat intolerance. No polyuria or polydipsia.  Marland Kitchen   Physical Examination Today's Vitals   02/20/23 1002  BP: 102/64  Pulse: 81  SpO2: 99%  Weight: 237 lb 9.6 oz (107.8 kg)  Height: 6\' 6"  (1.981 m)   Body mass index is 27.46 kg/m.  Gen: resting comfortably, no acute distress HEENT: no scleral icterus, pupils equal round and reactive, no palptable cervical adenopathy,  CV: RRR, no m/rg, no jvd Resp: Clear to auscultation bilaterally GI: abdomen is soft, non-tender, non-distended, normal bowel sounds, no hepatosplenomegaly MSK: extremities are warm, no edema.  Skin: warm, no rash Neuro:  no focal deficits Psych: appropriate affect   Diagnostic Studies  11/2017 cath There is moderate left ventricular systolic dysfunction. The left ventricular ejection fraction is 35-45% by visual  estimate. LV end diastolic pressure is mildly elevated. ----------------------------------------- ANGIOGRAPHY- PCI LESION #1: Mid LAD lesion is 80% stenosed. A drug-eluting stent was successfully placed using a STENT SYNERGY DES 2.25X16. -Postdilated to 2.4 mm Post intervention, there is a 0% residual stenosis. LESION #2: mid LAD to Dist LAD lesion is 90% stenosed. A drug-eluting stent was successfully placed using a STENT SYNERGY DES 2.25X12. -Postdilated to 2.4 mm Post intervention, there is a 0% residual stenosis. Prox LAD lesion is 40% stenosed -prior to lesion 1; apical, after lesion 2 A drug-eluting stent was successfully placed using a STENT SYNERGY DES 2.25X12. -Postdilated to 2.4 mm ----------------------------------------- Post intervention, there is a 0% residual stenosis. Trifurcation 90% lesion at the distal circumflex into LPL and PDA -small caliber distal vessels. Best treated medically as this is not a very good PCI target Prox small caliber nondominant RCA lesion is 99% stenosed.   Severe distal OM 2 -DES PCI with Synergy 2.25 mm x16 mm (2.4 mm); severe trifurcation disease with a very distal AV groove circumflex prior to PDA and PL Koni Kannan -medical management.   Subtotally occluded nondominant RCA Moderately reduced LVEF of roughly 40% with mildly elevated LVEDP.   Plan: Transfer to postprocedure unit for post PCI monitoring and TR removal Continue aggressive risk factor modification with statin and beta-blocker etc.   Recommend uninterrupted dual antiplatelet therapy  with Aspirin 81mg  daily and Ticagrelor 90mg  twice daily for a minimum of 12 months (ACS - Class I recommendation). -Would strongly consider reducing to 60 mg dose ticagrelor at 9-12 months and continue for an additional year.   Would be okay to stop aspirin after 3 months if necessary.  Okay to stop Brilinta after 6 months for procedures if necessary.     10/2018 nuclear stress There was no ST segment  deviation noted during stress. Defect 1: There is a small defect of mild severity present in the mid inferoseptal location. This is likely due to soft tissue attenuation artifact. This is a low risk study. No ischemic zones. Nuclear stress EF: 46     8/2021echo IMPRESSIONS     1. Left ventricular ejection fraction, by estimation, is 50 to 55%. The  left ventricle has low normal function. The left ventricle has no regional  wall motion abnormalities. There is moderate left ventricular hypertrophy.  Left ventricular diastolic  parameters are indeterminate.   2. Right ventricular systolic function is normal. The right ventricular  size is normal.   3. The mitral valve is normal in structure. No evidence of mitral valve  regurgitation. No evidence of mitral stenosis.   4. The aortic valve is tricuspid. Aortic valve regurgitation is not  visualized. No aortic stenosis is present.   5. The inferior vena cava is normal in size with greater than 50%  respiratory variability, suggesting right atrial pressure of 3 mmHg.        08/2020 echo   NORMAL LEFT VENTRICULAR FUNCTION WITH MILD LVH    NORMAL LA PRESSURES WITH DIASTOLIC DYSFUNCTION    NORMAL RIGHT VENTRICULAR SYSTOLIC FUNCTION    VALVULAR REGURGITATION: TRIVIAL MR, TRIVIAL TR    NO VALVULAR STENOSIS           Assessment and Plan  1. CAD/ICM/SOB -recent SOB/DOE, denies chest pains - order echo to look for any recurrent cardiac dysfunction. Pending results likely consider lexiscan - if benign cardiac workup would likely repeat PFTs - EKG today shows SR, no ischemic changes    2. Hyperlipidemia - does not tolerate statins, on repatha and zetia - repeat lipid panel  3. Preoperative procedure - needs colonscopy. Low risk procedure, ok to proceed from cardiac standpoint. Ok to hold plavix from cardiac standpoint, clearance would also be needed on holding plavix from Dr Corliss Skains who placed his intracereberal  stent    Antoine Poche, M.D.

## 2023-02-20 NOTE — Telephone Encounter (Signed)
ok to proceed from cardiac standpoint. Ok to hold plavix from cardiac standpoint, clearance would also be needed on holding plavix from Dr Corliss Skains who placed his intracereberal stent   Dominga Ferry MD

## 2023-02-20 NOTE — Telephone Encounter (Signed)
Pt notified to pick up order for stool test to take to lab when he does the other tests. Also I let him know that he can pick up the creon sample and to take 3 with each meal.

## 2023-02-26 ENCOUNTER — Ambulatory Visit: Payer: Medicare HMO | Attending: Cardiology

## 2023-02-26 DIAGNOSIS — R0602 Shortness of breath: Secondary | ICD-10-CM

## 2023-02-26 DIAGNOSIS — E1165 Type 2 diabetes mellitus with hyperglycemia: Secondary | ICD-10-CM | POA: Diagnosis not present

## 2023-02-26 DIAGNOSIS — E039 Hypothyroidism, unspecified: Secondary | ICD-10-CM | POA: Diagnosis not present

## 2023-02-26 LAB — ECHOCARDIOGRAM COMPLETE
AR max vel: 2.64 cm2
AV Area VTI: 2.62 cm2
AV Area mean vel: 2.52 cm2
AV Mean grad: 5 mm[Hg]
AV Peak grad: 9.1 mm[Hg]
Ao pk vel: 1.51 m/s
Area-P 1/2: 3.23 cm2
Calc EF: 50.5 %
Est EF: 50
MV VTI: 2.51 cm2
S' Lateral: 3.6 cm
Single Plane A2C EF: 46 %
Single Plane A4C EF: 50.9 %

## 2023-03-08 ENCOUNTER — Encounter (HOSPITAL_COMMUNITY): Payer: Self-pay

## 2023-03-08 ENCOUNTER — Other Ambulatory Visit: Payer: Self-pay

## 2023-03-08 ENCOUNTER — Emergency Department (HOSPITAL_COMMUNITY): Payer: Medicare HMO

## 2023-03-08 ENCOUNTER — Emergency Department (HOSPITAL_COMMUNITY)
Admission: EM | Admit: 2023-03-08 | Discharge: 2023-03-08 | Disposition: A | Discharge status: Home / Self Care | Payer: Medicare HMO | Attending: Emergency Medicine | Admitting: Emergency Medicine

## 2023-03-08 DIAGNOSIS — R220 Localized swelling, mass and lump, head: Secondary | ICD-10-CM | POA: Diagnosis not present

## 2023-03-08 DIAGNOSIS — R42 Dizziness and giddiness: Secondary | ICD-10-CM | POA: Insufficient documentation

## 2023-03-08 DIAGNOSIS — R791 Abnormal coagulation profile: Secondary | ICD-10-CM | POA: Diagnosis not present

## 2023-03-08 DIAGNOSIS — R519 Headache, unspecified: Secondary | ICD-10-CM | POA: Diagnosis not present

## 2023-03-08 DIAGNOSIS — I6782 Cerebral ischemia: Secondary | ICD-10-CM | POA: Diagnosis not present

## 2023-03-08 DIAGNOSIS — Z79899 Other long term (current) drug therapy: Secondary | ICD-10-CM | POA: Diagnosis not present

## 2023-03-08 DIAGNOSIS — I6522 Occlusion and stenosis of left carotid artery: Secondary | ICD-10-CM | POA: Diagnosis not present

## 2023-03-08 DIAGNOSIS — R29818 Other symptoms and signs involving the nervous system: Secondary | ICD-10-CM | POA: Diagnosis not present

## 2023-03-08 DIAGNOSIS — Z7982 Long term (current) use of aspirin: Secondary | ICD-10-CM | POA: Diagnosis not present

## 2023-03-08 DIAGNOSIS — Z7902 Long term (current) use of antithrombotics/antiplatelets: Secondary | ICD-10-CM | POA: Diagnosis not present

## 2023-03-08 DIAGNOSIS — I6503 Occlusion and stenosis of bilateral vertebral arteries: Secondary | ICD-10-CM | POA: Diagnosis not present

## 2023-03-08 LAB — URINALYSIS, ROUTINE W REFLEX MICROSCOPIC
Bilirubin Urine: NEGATIVE
Glucose, UA: 50 mg/dL — AB
Hgb urine dipstick: NEGATIVE
Ketones, ur: NEGATIVE mg/dL
Leukocytes,Ua: NEGATIVE
Nitrite: NEGATIVE
Protein, ur: 100 mg/dL — AB
Specific Gravity, Urine: 1.027 (ref 1.005–1.030)
pH: 5 (ref 5.0–8.0)

## 2023-03-08 LAB — CBC
HCT: 49.9 % (ref 39.0–52.0)
Hemoglobin: 16.4 g/dL (ref 13.0–17.0)
MCH: 28 pg (ref 26.0–34.0)
MCHC: 32.9 g/dL (ref 30.0–36.0)
MCV: 85.2 fL (ref 80.0–100.0)
Platelets: 307 10*3/uL (ref 150–400)
RBC: 5.86 MIL/uL — ABNORMAL HIGH (ref 4.22–5.81)
RDW: 14.6 % (ref 11.5–15.5)
WBC: 8.2 10*3/uL (ref 4.0–10.5)
nRBC: 0 % (ref 0.0–0.2)

## 2023-03-08 LAB — COMPREHENSIVE METABOLIC PANEL
ALT: 12 U/L (ref 0–44)
AST: 15 U/L (ref 15–41)
Albumin: 3.8 g/dL (ref 3.5–5.0)
Alkaline Phosphatase: 129 U/L — ABNORMAL HIGH (ref 38–126)
Anion gap: 9 (ref 5–15)
BUN: 16 mg/dL (ref 6–20)
CO2: 27 mmol/L (ref 22–32)
Calcium: 9.9 mg/dL (ref 8.9–10.3)
Chloride: 101 mmol/L (ref 98–111)
Creatinine, Ser: 0.95 mg/dL (ref 0.61–1.24)
GFR, Estimated: >60 mL/min (ref >60)
Glucose, Bld: 90 mg/dL (ref 70–99)
Potassium: 3.4 mmol/L — ABNORMAL LOW (ref 3.5–5.1)
Sodium: 137 mmol/L (ref 135–145)
Total Bilirubin: 0.5 mg/dL (ref <1.2)
Total Protein: 7.5 g/dL (ref 6.5–8.1)

## 2023-03-08 LAB — DIFFERENTIAL
Abs Immature Granulocytes: 0.02 10*3/uL (ref 0.00–0.07)
Basophils Absolute: 0 10*3/uL (ref 0.0–0.1)
Basophils Relative: 1 %
Eosinophils Absolute: 0.4 10*3/uL (ref 0.0–0.5)
Eosinophils Relative: 5 %
Immature Granulocytes: 0 %
Lymphocytes Relative: 44 %
Lymphs Abs: 3.6 10*3/uL (ref 0.7–4.0)
Monocytes Absolute: 0.4 10*3/uL (ref 0.1–1.0)
Monocytes Relative: 5 %
Neutro Abs: 3.7 10*3/uL (ref 1.7–7.7)
Neutrophils Relative %: 45 %

## 2023-03-08 LAB — RAPID URINE DRUG SCREEN, HOSP PERFORMED
Amphetamines: NOT DETECTED
Barbiturates: NOT DETECTED
Benzodiazepines: NOT DETECTED
Cocaine: NOT DETECTED
Opiates: NOT DETECTED
Tetrahydrocannabinol: NOT DETECTED

## 2023-03-08 LAB — APTT: aPTT: 26 s (ref 24–36)

## 2023-03-08 LAB — ETHANOL: Alcohol, Ethyl (B): <10 mg/dL (ref <10)

## 2023-03-08 LAB — PROTIME-INR
INR: 1 (ref 0.8–1.2)
Prothrombin Time: 13 s (ref 11.4–15.2)

## 2023-03-08 MED ORDER — MECLIZINE HCL 25 MG PO TABS: 25.0000 mg | ORAL_TABLET | Freq: Three times a day (TID) | ORAL | 0 refills | Status: DC | PRN

## 2023-03-08 MED ORDER — MECLIZINE HCL 12.5 MG PO TABS
25.0000 mg | ORAL_TABLET | Freq: Once | ORAL | Status: AC
  Administered 2023-03-08: 25 mg via ORAL
  Filled 2023-03-08: qty 2

## 2023-03-08 MED ORDER — IOHEXOL 350 MG/ML SOLN
75.0000 mL | Freq: Once | INTRAVENOUS | Status: AC | PRN
  Administered 2023-03-08: 75 mL via INTRAVENOUS

## 2023-03-08 NOTE — ED Notes (Signed)
Patient transported to CT 

## 2023-03-08 NOTE — ED Notes (Signed)
Patient transported to MRI 

## 2023-03-08 NOTE — ED Provider Notes (Signed)
[place] EMERGENCY DEPARTMENT AT [facility] Provider Note   CSN: [id number] Arrival date & time: 03/08/23  1614     History  Chief Complaint  Patient presents with   Headache    Devin [name] is a 52 y.o. male.  He is here with 3 days of intermittent frontal headache and dizziness room spinning.  He said he thinks he is having another stroke.  He said he has had the headaches before.  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  Also has cardiac stents.  The history is provided by the patient.  Headache Pain location:  Frontal Severity currently:  2/10 Severity at highest:  10/10 Onset quality:  Gradual Duration:  3 days Timing:  Intermittent Progression:  Unchanged Chronicity:  Recurrent Similar to prior headaches: yes   Relieved by:  None tried Worsened by:  Nothing Ineffective treatments:  None tried Associated symptoms: dizziness, loss of balance and nausea   Associated symptoms: no blurred vision, no fever, no neck pain and no vomiting        Home Medications Prior to Admission medications   Medication Sig Start Date End Date Taking? Authorizing Provider  aspirin 81 MG EC tablet Take 1 tablet (81 mg total) by mouth daily. With food 12/05/17   Shon Hale, MD  clopidogrel (PLAVIX) 75 MG tablet Take 1 tablet (75 mg total) by mouth daily. 10/15/19   Cleora Fleet, MD  Continuous Blood Gluc Receiver (FREESTYLE LIBRE 2 READER) DEVI USE AS DIRECTED 09/02/21   Nida, Denman George, MD  Continuous Blood Gluc Sensor (FREESTYLE LIBRE 2 SENSOR) MISC APPLY 1 PATCH TO SKIN ONCE EVERY 14 DAYS 03/27/22   Roma Kayser, MD  empagliflozin (JARDIANCE) 10 MG TABS tablet Take 1 tablet (10 mg total) by mouth daily before breakfast. Patient not taking: Reported on 02/20/2023 12/14/22   Roma Kayser, MD  EUTHYROX 200 MCG tablet Take 200 mcg by mouth at bedtime. 05/13/20   [provider]  Evolocumab (REPATHA SURECLICK)  140 MG/ML SOAJ Inject 1 pen into the skin every 14 (fourteen) days. 12/24/19   Antoine Poche, MD  ezetimibe (ZETIA) 10 MG tablet Take 1 tablet (10 mg total) by mouth daily. 10/15/19   Johnson, Clanford L, MD  famotidine (PEPCID) 40 MG tablet Take 40 mg by mouth daily. 11/23/20   [provider]  fenofibrate (TRICOR) 145 MG tablet Take 1 tablet (145 mg total) by mouth daily. 10/15/19   Johnson, Clanford L, MD  fluticasone (FLONASE) 50 MCG/ACT nasal spray Place 2 sprays into both nostrils 2 (two) times daily. 06/24/20   [provider]  furosemide (LASIX) 20 MG tablet Take 1 tablet (20 mg total) by mouth daily as needed. 10/19/21   Antoine Poche, MD  gabapentin (NEURONTIN) 300 MG capsule One po q AM, one po q PM and 2 po qHS Patient taking differently: Take 300 mg by mouth 2 (two) times daily. 12/23/19   Sater, Pearletha Furl, MD  glucose blood (ACCU-CHEK GUIDE) test strip Test glucose 2 times a day 08/10/20   Roma Kayser, MD  icosapent Ethyl (VASCEPA) 1 g capsule Take 2 capsules by mouth twice daily 02/06/22   Antoine Poche, MD  Insulin Pen Needle (B-D ULTRAFINE III SHORT PEN) 31G X 8 MM MISC Use as directed to inject insulin 2 times daily. 05/02/21   Roma Kayser, MD  LANTUS SOLOSTAR 100 UNIT/ML Solostar Pen Inject 60 Units into the  skin daily. 01/17/23   [provider]  levothyroxine (SYNTHROID) 88 MCG tablet Take 88 mcg by mouth daily. Patient not taking: Reported on 02/20/2023 01/25/21   [provider]  lipase/protease/amylase (CREON) 36000 UNITS CPEP capsule Take 36,000 Units by mouth. Samples given - Take 3 with meals Patient not taking: Reported on 02/20/2023    [provider]  methocarbamol (ROBAXIN) 500 MG tablet Take 500 mg by mouth 3 (three) times daily as needed for muscle spasms. 02/19/23   [provider]  nitroGLYCERIN (NITROSTAT) 0.4 MG SL tablet Place 1 tablet (0.4 mg total) under the tongue every 5 (five)  minutes as needed for chest pain. 02/14/21   Antoine Poche, MD  NOVOLOG FLEXPEN 100 UNIT/ML FlexPen Inject into the skin 3 (three) times daily with meals. 20 units with meals 01/17/23   [provider]  pantoprazole (PROTONIX) 40 MG tablet Take 1 tablet (40 mg total) by mouth daily. 12/11/22 03/11/23  Franky Macho, MD  sitaGLIPtin (JANUVIA) 50 MG tablet Take 1 tablet (50 mg total) by mouth daily. 06/15/21   Roma Kayser, MD  VENTOLIN HFA 108 (90 Base) MCG/ACT inhaler Inhale into the lungs. 11/23/20   [provider]      Allergies    Crestor [rosuvastatin], Trulicity [dulaglutide], Bee venom, and Morphine    Review of Systems   Review of Systems  Constitutional:  Negative for fever.  Eyes:  Negative for blurred vision.  Respiratory:  Negative for shortness of breath.   Cardiovascular:  Negative for chest pain.  Gastrointestinal:  Positive for nausea. Negative for vomiting.  Musculoskeletal:  Negative for neck pain.  Neurological:  Positive for dizziness, headaches and loss of balance. Negative for speech difficulty.    Physical Exam Updated Vital Signs BP (!) 156/99 (BP Location: Right Arm)   Pulse 76   Temp 97.7 F (36.5 C) (Oral)   Resp 16   Ht 6\' 5"  (1.956 m)   Wt 108.9 kg   SpO2 100%   BMI 28.46 kg/m  Physical Exam Vitals and nursing note reviewed.  Constitutional:      General: He is not in acute distress.    Appearance: He is well-developed.  HENT:     Head: Normocephalic and atraumatic.  Eyes:     Conjunctiva/sclera: Conjunctivae normal.  Cardiovascular:     Rate and Rhythm: Normal rate and regular rhythm.     Heart sounds: No murmur heard. Pulmonary:     Effort: Pulmonary effort is normal. No respiratory distress.     Breath sounds: Normal breath sounds.  Abdominal:     Palpations: Abdomen is soft.     Tenderness: There is no abdominal tenderness.  Musculoskeletal:        General: No swelling.     Cervical back: Neck supple.   Skin:    General: Skin is warm and dry.     Capillary Refill: Capillary refill takes less than 2 seconds.  Neurological:     Mental Status: He is alert.     Cranial Nerves: No cranial nerve deficit or dysarthria.     Sensory: No sensory deficit.     Motor: No weakness.     ED Results / Procedures / Treatments   Labs (all labs ordered are listed, but only abnormal results are displayed) Labs Reviewed  CBC - Abnormal; Notable for the following components:      Result Value   RBC 5.86 (*)    All other components within  normal limits  COMPREHENSIVE METABOLIC PANEL - Abnormal; Notable for the following components:   Potassium 3.4 (*)    Alkaline Phosphatase 129 (*)    All other components within normal limits  URINALYSIS, ROUTINE W REFLEX MICROSCOPIC - Abnormal; Notable for the following components:   Glucose, UA 50 (*)    Protein, ur 100 (*)    Bacteria, UA RARE (*)    All other components within normal limits  ETHANOL  DIFFERENTIAL  RAPID URINE DRUG SCREEN, HOSP PERFORMED  PROTIME-INR  APTT    EKG EKG Interpretation Date/Time:  Thursday March 08 2023 17:21:50 EST Ventricular Rate:  56 PR Interval:  156 QRS Duration:  111 QT Interval:  429 QTC Calculation: 414 R Axis:   111  Text Interpretation: Sinus rhythm Right axis deviation Nonspecific repol abnormality, lateral leads Baseline wander in lead(s) V6 No significant change since prior 12/24 Confirmed by Meridee Score [phone]) on 03/08/2023 5:23:25 PM  Radiology CT ANGIO HEAD NECK W WO CM Result Date: 03/08/2023 CLINICAL DATA:  Neuro deficit, acute, stroke suspected EXAM: CT ANGIOGRAPHY HEAD AND NECK WITH AND WITHOUT CONTRAST TECHNIQUE: Multidetector CT imaging of the head and neck was performed using the standard protocol during bolus administration of intravenous contrast. Multiplanar CT image reconstructions and MIPs were obtained to evaluate the vascular anatomy. Carotid stenosis measurements (when applicable)  are obtained utilizing NASCET criteria, using the distal internal carotid diameter as the denominator. RADIATION DOSE REDUCTION: This exam was performed according to the departmental dose-optimization program which includes automated exposure control, adjustment of the mA and/or kV according to patient size and/or use of iterative reconstruction technique. CONTRAST:  75mL OMNIPAQUE IOHEXOL 350 MG/ML SOLN COMPARISON:  CTA Head/neck 08/18/22 FINDINGS: CT HEAD FINDINGS Brain: No hemorrhage. No hydrocephalus. No extra-axial fluid collection. No CT evidence of an acute cortical infarct. No mass effect. No mass lesion. No CT evidence of an acute cortical infarct. Vascular: No hyperdense vessel or unexpected calcification. Skull: Soft tissue swelling along the scalp vertex. No evidence of calvarial fracture Sinuses/Orbits: No middle ear or mastoid effusion. Paranasal sinuses are clear. Orbits are unremarkable. Other: None. Review of the MIP images confirms the above findings CTA NECK FINDINGS Aortic arch: Standard branching. Imaged portion shows no evidence of aneurysm or dissection. No significant stenosis of the major arch vessel origins. Right carotid system: No evidence of dissection, stenosis (50% or greater), or occlusion. Mild narrowing of the origin of the right ICA Left carotid system: No evidence of dissection or occlusion. 70% stenosis at the origin of the left ICA Vertebral arteries: Codominant. No evidence of dissection, stenosis (50% or greater), or occlusion. There is mild narrowing of the origin of the bilateral vertebral artery secondary to calcified and noncalcified atherosclerotic plaque there is a vascular stent in the V4 segment of the left vertebral artery. If there is dense for defect near the proximal landing zone of the stent. Skeleton: Negative Other neck: Negative Upper chest: Negative Review of the MIP images confirms the above findings CTA HEAD FINDINGS Anterior circulation: Moderate narrowing in  the cavernous segment of the left ICA. The right MCA and bilateral ACA territories are normal appearance. Posterior circulation: Moderate to severe narrowing of the origin of the right P1 segment. Multifocal regions moderate narrowing in the distal right P2 and P3 segments. Ear Venous sinuses: As permitted by contrast timing, patent. Anatomic variants: None Review of the MIP images confirms the above findings IMPRESSION: 1. No hemorrhage or CT evidence of an acute cortical infarct.  2. No intracranial large vessel occlusion. 3. Moderate to severe narrowing at the origin of the right P1 segment. Multifocal regions of moderate narrowing in the distal right P2 and P3 segments. 4. 70% stenosis at the origin of the left ICA, unchanged. 5. Vascular stent in the V4 segment of the left vertebral artery. There is a dense filling defect near the proximal landing zone of the stent. This is favored to represent streak artifact, but thrombus is also a differential consideration (less likely). Electronically Signed   By: Lorenza Cambridge M.D.   On: 03/08/2023 20:05   MR BRAIN WO CONTRAST Result Date: 03/08/2023 CLINICAL DATA:  Neuro deficit, acute, stroke suspected EXAM: MRI HEAD WITHOUT CONTRAST TECHNIQUE: Multiplanar, multiecho pulse sequences of the brain and surrounding structures were obtained without intravenous contrast. COMPARISON:  Brain MR 08/18/22 FINDINGS: Brain: Negative for an acute infarct. No hemorrhage. No hydrocephalus. No extra-axial fluid collection. No mass effect. No mass lesion. There is background of mild chronic microvascular ischemic change. Vascular: Normal flow voids. Skull and upper cervical spine: Normal marrow signal. Sinuses/Orbits: No middle ear or mastoid effusion. Paranasal sinuses are clear. Orbits are unremarkable. Other: None. IMPRESSION: No acute intracranial process. Electronically Signed   By: Lorenza Cambridge M.D.   On: 03/08/2023 19:22    Procedures Procedures    Medications Ordered in  ED Medications  iohexol (OMNIPAQUE) 350 MG/ML injection 75 mL (75 mLs Intravenous Contrast Given 03/08/23 1917)  meclizine (ANTIVERT) tablet 25 mg (25 mg Oral Given 03/08/23 1951)    ED Course/ Medical Decision Making/ A&P Clinical Course as of 03/09/23 1039  Thu Mar 08, 2023  1937 Reviewed case with neurology Dr. Amada Jupiter.  He said he would proceed with getting a CT angio.  He does not feel that the patient would benefit from admission and TIA workup as he is already on dual antiplatelet therapy [MB]  2123 Reviewed CT and MRI findings with Dr. Wilford Corner neurology.  He felt the patient can be followed up outpatient with neurology. [MB]    Clinical Course User Index [MB] Terrilee Files, MD                                 Medical Decision Making Amount and/or Complexity of Data Reviewed Labs: ordered. Radiology: ordered.  Risk Prescription drug management.   This patient complains of dizziness unsteadiness headaches; this involves an extensive number of treatment Options and is a complaint that carries with it a high risk of complications and morbidity. The differential includes stroke, bleed, vertigo, dehydration, arrhythmia  I ordered, reviewed and interpreted labs, which included CBC normal chemistries with mildly low potassium urinalysis without signs of infection talk screen negative  I ordered medication oral meclizine and reviewed PMP when indicated. I ordered imaging studies which included MRI brain and CT angio head and neck and I independently    visualized and interpreted imaging which showed significant vascular narrowing Additional history obtained from patient's family Previous records obtained and reviewed in epic including prior neurology and IR notes I consulted neurology Dr. Amada Jupiter and Dr. Wilford Corner And discussed lab and imaging findings and discussed disposition.  Cardiac monitoring reviewed, sinus rhythm Social determinants considered, tobacco use Critical  Interventions: None  After the interventions stated above, I reevaluated the patient and found patient to be awake alert neuro intact and stable on feet Admission and further testing considered, no indications for admission.  Will trial him  on meclizine and recommended close follow-up with his neurologist.  Return instructions discussed         Final Clinical Impression(s) / ED Diagnoses Final diagnoses:  Frontal headache  Dizziness    Rx / DC Orders ED Discharge Orders          Ordered    meclizine (ANTIVERT) 25 MG tablet  3 times daily PRN        03/08/23 2124    Ambulatory referral to Neurology       Comments: An appointment is requested in approximately: 2 weeks   03/08/23 2124              Terrilee Files, MD 03/09/23 1039

## 2023-03-08 NOTE — Discharge Instructions (Addendum)
You had a CAT scan and an MRI that did not show any obvious sign of stroke.  Please continue to take your aspirin and clopidogrel.  We are adding meclizine to see if it helps with the dizziness room spinning.  Stay well-hydrated.  Follow-up with your neurology team.  Return if any worsening or concerning symptoms

## 2023-03-08 NOTE — Patient Instructions (Signed)
Devin [name]  03/08/2023     @PREFPERIOPPHARMACY @   Your procedure is scheduled on  03/15/2023.   Report to [name] at  0800  A.M.   Call this number if you have problems the morning of surgery:  [phone]  If you experience any cold or flu symptoms such as cough, fever, chills, shortness of breath, etc. between now and your scheduled surgery, please notify us at the above number.   Remember:        Your last dose of plavix should have been on 03/09/2023.        Your last dose of jardiance should be on 03/11/2023.        Your last dose of januvia should be on 03/13/2023.       Take 1/2 of your usual insulin dose the night before your procedure.        DO NOT take any medications for diabetes the morning of your procedure.    Follow the diet and prep instructions given to you by the office.   You may drink clear liquids until 0600 am on 03/15/2023.     Clear liquids allowed are:                    Water, Juice (No red color; non-citric and without pulp; diabetics please choose diet or no sugar options), Carbonated beverages (diabetics please choose diet or no sugar options), Clear Tea (No creamer, milk, or cream, including half & half and powdered creamer), Black Coffee Only (No creamer, milk or cream, including half & half and powdered creamer), and Clear Sports drink (No red color; diabetics please choose diet or no sugar options)    Take these medicines the morning of surgery with A SIP OF WATER  famotidine, gabapentin, methocarbamol (if needed), pantoprazole.    Do not wear jewelry, make-up or nail polish, including gel polish,  artificial nails, or any other type of covering on natural nails (fingers and  toes).  Do not wear lotions, powders, or perfumes, or deodorant.  Do not shave 48 hours prior to surgery.  Men may shave face and neck.  Do not bring valuables to the hospital.  [facility] is not responsible for any belongings or  valuables.  Contacts, dentures or bridgework may not be worn into surgery.  Leave your suitcase in the car.  After surgery it may be brought to your room.  For patients admitted to the hospital, discharge time will be determined by your treatment team.  Patients discharged the day of surgery will not be allowed to drive home and must have someone with them for 24 hours.    Special instructions:   DO NOT smoke tobacco or vape for 24 hours before your procedure.  Please read over the following fact sheets that you were given. Anesthesia Post-op Instructions and Care and Recovery After Surgery      Colonoscopy, Adult, Care After The following information offers guidance on how to care for yourself after your procedure. Your health care provider may also give you more specific instructions. If you have problems or questions, contact your health care provider. What can I expect after the procedure? After the procedure, it is common to have: A small amount of blood in your stool for 24 hours after the procedure. Some gas. Mild cramping or bloating of your abdomen. Follow these instructions at home: Eating and drinking  Drink enough fluid to keep your  urine pale yellow. Follow instructions from your health care provider about eating or drinking restrictions. Resume your normal diet as told by your health care provider. Avoid heavy or fried foods that are hard to digest. Activity Rest as told by your health care provider. Avoid sitting for a long time without moving. Get up to take short walks every 1-2 hours. This is important to improve blood flow and breathing. Ask for help if you feel weak or unsteady. Return to your normal activities as told by your health care provider. Ask your health care provider what activities are safe for you. Managing cramping and bloating  Try walking around when you have cramps or feel bloated. If directed, apply heat to your abdomen as told by your health  care provider. Use the heat source that your health care provider recommends, such as a moist heat pack or a heating pad. Place a towel between your skin and the heat source. Leave the heat on for 20-30 minutes. Remove the heat if your skin turns bright red. This is especially important if you are unable to feel pain, heat, or cold. You have a greater risk of getting burned. General instructions If you were given a sedative during the procedure, it can affect you for several hours. Do not drive or operate machinery until your health care provider says that it is safe. For the first 24 hours after the procedure: Do not sign important documents. Do not drink alcohol. Do your regular daily activities at a slower pace than normal. Eat soft foods that are easy to digest. Take over-the-counter and prescription medicines only as told by your health care provider. Keep all follow-up visits. This is important. Contact a health care provider if: You have blood in your stool 2-3 days after the procedure. Get help right away if: You have more than a small spotting of blood in your stool. You have large blood clots in your stool. You have swelling of your abdomen. You have nausea or vomiting. You have a fever. You have increasing pain in your abdomen that is not relieved with medicine. These symptoms may be an emergency. Get help right away. Call 911. Do not wait to see if the symptoms will go away. Do not drive yourself to the hospital. Summary After the procedure, it is common to have a small amount of blood in your stool. You may also have mild cramping and bloating of your abdomen. If you were given a sedative during the procedure, it can affect you for several hours. Do not drive or operate machinery until your health care provider says that it is safe. Get help right away if you have a lot of blood in your stool, nausea or vomiting, a fever, or increased pain in your abdomen. This information  is not intended to replace advice given to you by your health care provider. Make sure you discuss any questions you have with your health care provider. Document Revised: 04/11/2022 Document Reviewed: 10/20/2020 Elsevier Patient Education  2024 [employer] Monitored Anesthesia Care, Care After The following information offers guidance on how to care for yourself after your procedure. Your health care provider may also give you more specific instructions. If you have problems or questions, contact your health care provider. What can I expect after the procedure? After the procedure, it is common to have: Tiredness. Little or no memory about what happened during or after the procedure. Impaired judgment when it comes to making decisions. Nausea or vomiting. Some  trouble with balance. Follow these instructions at home: For the time period you were told by your health care provider:  Rest. Do not participate in activities where you could fall or become injured. Do not drive or use machinery. Do not drink alcohol. Do not take sleeping pills or medicines that cause drowsiness. Do not make important decisions or sign legal documents. Do not take care of children on your own. Medicines Take over-the-counter and prescription medicines only as told by your health care provider. If you were prescribed antibiotics, take them as told by your health care provider. Do not stop using the antibiotic even if you start to feel better. Eating and drinking Follow instructions from your health care provider about what you may eat and drink. Drink enough fluid to keep your urine pale yellow. If you vomit: Drink clear fluids slowly and in small amounts as you are able. Clear fluids include water, ice chips, low-calorie sports drinks, and fruit juice that has water added to it (diluted fruit juice). Eat light and bland foods in small amounts as you are able. These foods include bananas, applesauce, rice,  lean meats, toast, and crackers. General instructions  Have a responsible adult stay with you for the time you are told. It is important to have someone help care for you until you are awake and alert. If you have sleep apnea, surgery and some medicines can increase your risk for breathing problems. Follow instructions from your health care provider about wearing your sleep device: When you are sleeping. This includes during daytime naps. While taking prescription pain medicines, sleeping medicines, or medicines that make you drowsy. Do not use any products that contain nicotine or tobacco. These products include cigarettes, chewing tobacco, and vaping devices, such as e-cigarettes. If you need help quitting, ask your health care provider. Contact a health care provider if: You feel nauseous or vomit every time you eat or drink. You feel light-headed. You are still sleepy or having trouble with balance after 24 hours. You get a rash. You have a fever. You have redness or swelling around the IV site. Get help right away if: You have trouble breathing. You have new confusion after you get home. These symptoms may be an emergency. Get help right away. Call 911. Do not wait to see if the symptoms will go away. Do not drive yourself to the hospital. This information is not intended to replace advice given to you by your health care provider. Make sure you discuss any questions you have with your health care provider. Document Revised: 07/25/2021 Document Reviewed: 07/25/2021 Elsevier Patient Education  2024 [employer].

## 2023-03-08 NOTE — ED Triage Notes (Signed)
Pt reports a headache with intermittent dizziness x 2-3 days.

## 2023-03-09 ENCOUNTER — Encounter (HOSPITAL_COMMUNITY)
Admission: RE | Admit: 2023-03-09 | Discharge: 2023-03-09 | Disposition: A | Discharge status: Home / Self Care | Payer: Medicare HMO | Source: Ambulatory Visit | Attending: Gastroenterology | Admitting: Gastroenterology

## 2023-03-09 DIAGNOSIS — E1110 Type 2 diabetes mellitus with ketoacidosis without coma: Secondary | ICD-10-CM

## 2023-03-09 DIAGNOSIS — E1100 Type 2 diabetes mellitus with hyperosmolarity without nonketotic hyperglycemic-hyperosmolar coma (NKHHC): Secondary | ICD-10-CM

## 2023-03-09 NOTE — Pre-Procedure Instructions (Signed)
Patient was no show for pre-op Dr Tasia Catchings and office notified.

## 2023-03-12 ENCOUNTER — Telehealth (INDEPENDENT_AMBULATORY_CARE_PROVIDER_SITE_OTHER): Payer: Self-pay | Admitting: Gastroenterology

## 2023-03-12 NOTE — Telephone Encounter (Signed)
Ahmed, Juanetta Beets, MD  Elsie Amis, RN; Marlowe Shores, LPN; Elinor Dodge, LPN Thank you for the heads up  Please reschedule once patient have seen neurology as there was concern for TIA ( mini stroke)       Previous Messages    ----- Message ----- From: Elsie Amis, RN Sent: 03/09/2023  11:11 AM EST To: Elinor Dodge, LPN; Marlowe Shores, LPN; * Subject: no show                                        Good morning! Tonita Cong did not show for his pre-op this morning. He was in the ED yesterday and had a CT of his head. Dr Tasia Catchings, will you review his chart and see if you want to proceed with his procedure at this time, please? Thank you!

## 2023-03-15 ENCOUNTER — Encounter (HOSPITAL_COMMUNITY): Admission: RE | Payer: Self-pay | Source: Home / Self Care

## 2023-03-15 ENCOUNTER — Ambulatory Visit (HOSPITAL_COMMUNITY): Admission: RE | Admit: 2023-03-15 | Payer: Medicare HMO | Source: Home / Self Care | Admitting: Gastroenterology

## 2023-03-15 SURGERY — COLONOSCOPY WITH PROPOFOL
Anesthesia: Monitor Anesthesia Care

## 2023-03-21 ENCOUNTER — Encounter: Payer: Self-pay | Admitting: *Deleted

## 2023-04-04 ENCOUNTER — Telehealth: Payer: Self-pay | Admitting: Cardiology

## 2023-04-04 NOTE — Telephone Encounter (Signed)
Patient's spouse called to see if Dr. Wyline Mood has order Lexi Scan for patient to get

## 2023-04-04 NOTE — Telephone Encounter (Signed)
 Returned call to pt. No answer. No voice mail.

## 2023-04-06 ENCOUNTER — Other Ambulatory Visit: Payer: Self-pay

## 2023-04-06 DIAGNOSIS — R0602 Shortness of breath: Secondary | ICD-10-CM

## 2023-04-06 NOTE — Telephone Encounter (Signed)
Lexiscan order placed per provider- please see Echo result note.

## 2023-04-06 NOTE — Telephone Encounter (Signed)
Left a message for pt to call office back regarding stress test. Will need letter printed for testing.

## 2023-04-10 DIAGNOSIS — E039 Hypothyroidism, unspecified: Secondary | ICD-10-CM | POA: Diagnosis not present

## 2023-04-10 DIAGNOSIS — E1165 Type 2 diabetes mellitus with hyperglycemia: Secondary | ICD-10-CM | POA: Diagnosis not present

## 2023-04-11 ENCOUNTER — Encounter: Payer: Self-pay | Admitting: *Deleted

## 2023-04-11 NOTE — Telephone Encounter (Signed)
Patient informed and verbalized understanding of plan. Instructions for Lexiscan reviewed and aware a copy is available. Request sister Michela Pitcher be contacted for scheduling lexiscan [phone]

## 2023-04-12 ENCOUNTER — Telehealth: Payer: Self-pay | Admitting: Nurse Practitioner

## 2023-04-12 NOTE — Telephone Encounter (Signed)
Checking percert on the following patient for testing scheduled at [facility].    LEXISCAN   04/20/2023

## 2023-04-17 ENCOUNTER — Ambulatory Visit: Payer: Medicare HMO | Admitting: "Endocrinology

## 2023-04-20 ENCOUNTER — Encounter (HOSPITAL_COMMUNITY): Payer: Medicare HMO

## 2023-04-27 ENCOUNTER — Ambulatory Visit (HOSPITAL_COMMUNITY)
Admission: RE | Admit: 2023-04-27 | Discharge: 2023-04-27 | Disposition: A | Discharge status: Home / Self Care | Payer: Medicare HMO | Source: Ambulatory Visit | Attending: Cardiology | Admitting: Cardiology

## 2023-04-27 ENCOUNTER — Encounter (HOSPITAL_BASED_OUTPATIENT_CLINIC_OR_DEPARTMENT_OTHER)
Admission: RE | Admit: 2023-04-27 | Discharge: 2023-04-27 | Disposition: A | Discharge status: Home / Self Care | Payer: Medicare HMO | Source: Ambulatory Visit | Attending: Cardiology | Admitting: Cardiology

## 2023-04-27 ENCOUNTER — Encounter (HOSPITAL_COMMUNITY): Payer: Self-pay

## 2023-04-27 DIAGNOSIS — R0602 Shortness of breath: Secondary | ICD-10-CM | POA: Insufficient documentation

## 2023-04-27 LAB — NM MYOCAR MULTI W/SPECT W/WALL MOTION / EF
Base ST Depression (mm): 0 mm
Estimated workload: 1
Exercise duration (min): 0 min
Exercise duration (sec): 0 s
LV dias vol: 123 mL (ref 62–150)
LV sys vol: 55 mL
MPHR: 169 {beats}/min
Nuc Stress EF: 55 %
Peak HR: 95 {beats}/min
Percent HR: 56 %
RATE: 0.3
Rest HR: 71 {beats}/min
Rest Nuclear Isotope Dose: 11 mCi
SDS: 0
SRS: 0
SSS: 0
ST Depression (mm): 0 mm
Stress Nuclear Isotope Dose: 31 mCi
TID: 1

## 2023-04-27 MED ORDER — SODIUM CHLORIDE FLUSH 0.9 % IV SOLN
INTRAVENOUS | Status: AC
  Administered 2023-04-27: 10 mL via INTRAVENOUS
  Filled 2023-04-27: qty 10

## 2023-04-27 MED ORDER — REGADENOSON 0.4 MG/5ML IV SOLN
INTRAVENOUS | Status: AC
  Administered 2023-04-27: 0.4 mg via INTRAVENOUS
  Filled 2023-04-27: qty 5

## 2023-04-27 MED ORDER — TECHNETIUM TC 99M TETROFOSMIN IV KIT
10.0000 | PACK | Freq: Once | INTRAVENOUS | Status: AC | PRN
  Administered 2023-04-27: 11 via INTRAVENOUS

## 2023-04-27 MED ORDER — TECHNETIUM TC 99M TETROFOSMIN IV KIT
30.0000 | PACK | Freq: Once | INTRAVENOUS | Status: AC | PRN
  Administered 2023-04-27: 31 via INTRAVENOUS

## 2023-04-30 ENCOUNTER — Other Ambulatory Visit (INDEPENDENT_AMBULATORY_CARE_PROVIDER_SITE_OTHER): Payer: Self-pay | Admitting: Gastroenterology

## 2023-05-14 ENCOUNTER — Telehealth: Payer: Self-pay

## 2023-05-14 DIAGNOSIS — R0602 Shortness of breath: Secondary | ICD-10-CM

## 2023-05-14 NOTE — Telephone Encounter (Signed)
 The patient has been notified of the result and verbalized understanding.  All questions (if any) were answered. Roseanne Reno, CMA 05/14/2023 4:20 PM

## 2023-05-14 NOTE — Telephone Encounter (Signed)
-----   Message from [place] sent at 05/08/2023  8:27 AM EST ----- Normal stress test, no evidence of any significant blockages. Heart testing has been fine, nothing that would be causing his SOB. Can we order PFTs for SOB please  Dominga Ferry MD

## 2023-05-22 ENCOUNTER — Ambulatory Visit: Payer: Medicare HMO | Admitting: Nurse Practitioner

## 2023-05-28 ENCOUNTER — Ambulatory Visit (HOSPITAL_COMMUNITY)
Admission: RE | Admit: 2023-05-28 | Discharge: 2023-05-28 | Disposition: A | Discharge status: Home / Self Care | Source: Ambulatory Visit | Attending: Cardiology | Admitting: Cardiology

## 2023-05-28 ENCOUNTER — Other Ambulatory Visit: Payer: Self-pay | Admitting: "Endocrinology

## 2023-05-28 DIAGNOSIS — R0602 Shortness of breath: Secondary | ICD-10-CM | POA: Diagnosis not present

## 2023-05-28 DIAGNOSIS — E1165 Type 2 diabetes mellitus with hyperglycemia: Secondary | ICD-10-CM | POA: Diagnosis not present

## 2023-05-28 DIAGNOSIS — E039 Hypothyroidism, unspecified: Secondary | ICD-10-CM | POA: Diagnosis not present

## 2023-05-28 LAB — PULMONARY FUNCTION TEST
DL/VA % pred: 87 %
DL/VA: 3.74 ml/min/mmHg/L
DLCO unc % pred: 71 %
DLCO unc: 25.53 ml/min/mmHg
FEF 25-75 Post: 2.54 L/s
FEF 25-75 Pre: 2.75 L/s
FEF2575-%Change-Post: -7 %
FEF2575-%Pred-Post: 61 %
FEF2575-%Pred-Pre: 66 %
FEV1-%Change-Post: 2 %
FEV1-%Pred-Post: 69 %
FEV1-%Pred-Pre: 67 %
FEV1-Post: 3.36 L
FEV1-Pre: 3.26 L
FEV1FVC-%Change-Post: -3 %
FEV1FVC-%Pred-Pre: 88 %
FEV6-%Change-Post: 7 %
FEV6-%Pred-Post: 82 %
FEV6-%Pred-Pre: 77 %
FEV6-Post: 5.01 L
FEV6-Pre: 4.67 L
FEV6FVC-%Change-Post: 0 %
FEV6FVC-%Pred-Post: 102 %
FEV6FVC-%Pred-Pre: 102 %
FVC-%Change-Post: 6 %
FVC-%Pred-Post: 80 %
FVC-%Pred-Pre: 75 %
FVC-Post: 5.04 L
FVC-Pre: 4.73 L
Post FEV1/FVC ratio: 67 %
Post FEV6/FVC ratio: 100 %
Pre FEV1/FVC ratio: 69 %
Pre FEV6/FVC Ratio: 99 %
RV % pred: 49 %
RV: 1.21 L
TLC % pred: 71 %
TLC: 5.96 L

## 2023-05-28 MED ORDER — ALBUTEROL SULFATE (2.5 MG/3ML) 0.083% IN NEBU
2.5000 mg | INHALATION_SOLUTION | Freq: Once | RESPIRATORY_TRACT | Status: AC
  Administered 2023-05-28: 2.5 mg via RESPIRATORY_TRACT

## 2023-06-15 ENCOUNTER — Telehealth: Payer: Self-pay

## 2023-06-15 DIAGNOSIS — J449 Chronic obstructive pulmonary disease, unspecified: Secondary | ICD-10-CM

## 2023-06-15 NOTE — Telephone Encounter (Signed)
 The patient has been notified of the result and verbalized understanding.  All questions (if any) were answered. Roseanne Reno, [facility] 06/15/2023 9:09 AM     Pulmonology referral entered.

## 2023-06-15 NOTE — Telephone Encounter (Signed)
-----   Message from Dina Rich sent at 06/14/2023 11:03 AM EDT ----- Breathing tests with some evidence of COPD, please refer to pulmonary Dominga Ferry MD

## 2023-06-25 ENCOUNTER — Encounter: Payer: Self-pay | Admitting: Nurse Practitioner

## 2023-06-25 ENCOUNTER — Other Ambulatory Visit: Payer: Self-pay | Admitting: Nurse Practitioner

## 2023-06-25 ENCOUNTER — Telehealth: Payer: Self-pay | Admitting: Nurse Practitioner

## 2023-06-25 ENCOUNTER — Ambulatory Visit: Payer: Self-pay | Attending: Nurse Practitioner | Admitting: Nurse Practitioner

## 2023-06-25 VITALS — BP 118/80 | HR 85 | Ht 72.0 in | Wt 242.0 lb

## 2023-06-25 DIAGNOSIS — E782 Mixed hyperlipidemia: Secondary | ICD-10-CM

## 2023-06-25 DIAGNOSIS — R002 Palpitations: Secondary | ICD-10-CM

## 2023-06-25 DIAGNOSIS — I2510 Atherosclerotic heart disease of native coronary artery without angina pectoris: Secondary | ICD-10-CM | POA: Diagnosis not present

## 2023-06-25 DIAGNOSIS — I5032 Chronic diastolic (congestive) heart failure: Secondary | ICD-10-CM | POA: Diagnosis not present

## 2023-06-25 DIAGNOSIS — E785 Hyperlipidemia, unspecified: Secondary | ICD-10-CM | POA: Diagnosis not present

## 2023-06-25 DIAGNOSIS — Z8673 Personal history of transient ischemic attack (TIA), and cerebral infarction without residual deficits: Secondary | ICD-10-CM | POA: Diagnosis not present

## 2023-06-25 DIAGNOSIS — R0609 Other forms of dyspnea: Secondary | ICD-10-CM | POA: Diagnosis not present

## 2023-06-25 DIAGNOSIS — R42 Dizziness and giddiness: Secondary | ICD-10-CM | POA: Diagnosis not present

## 2023-06-25 DIAGNOSIS — J449 Chronic obstructive pulmonary disease, unspecified: Secondary | ICD-10-CM

## 2023-06-25 DIAGNOSIS — N179 Acute kidney failure, unspecified: Secondary | ICD-10-CM

## 2023-06-25 DIAGNOSIS — N189 Chronic kidney disease, unspecified: Secondary | ICD-10-CM

## 2023-06-25 NOTE — Telephone Encounter (Signed)
 Checking percert on the following   3 day zio xt

## 2023-06-25 NOTE — Progress Notes (Unsigned)
 Cardiology Office Note:  .   Date:  06/25/2023 ID:  Devin [name], DOB 1970/11/02, MRN [id number] PCP: Donetta Potts, MD  [facility] Providers Cardiologist:  Dina Rich, MD    History of Present Illness: .   Devin [name] is a 53 y.o. male with a PMH of CAD, CHF, chest pain, COPD, DOE, HLD, hx of CVA ,s/p image-guided cerebral angiogram with revascularization of vertebrobasilar junction/proximal basilar artery stenosis using stent assisted angioplasty in 2019 by IR, later on had ISR requiring repeat procedure, who presents today for 3 month follow-up.   History of 2 drug-eluting stents to LAD in 2019, DES to OM, trifurcation lesion distal to left circumflex, PDA and LPL Rex for medical therapy, with poor PCI target.  RCA was found to be a small vessel, recommended be treated medically.  Interventional cardiology recommended extended DAPT.  EF in 2019 35 to 40%, repeat EF in 2021 50 to 55%.  Last seen by Dr. Dina Rich on February 20, 2023.  He admitted to recent shortness of breath/dyspnea on exertion.  Denied any chest pain.  Echocardiogram was arranged.  Echo revealed normal LVEF, grade 1 DD.  Eugenie Birks was arranged in February 2025 and revealed no evidence of ischemia or infarction, study felt to be low risk.  PFTs were arranged -revealed some evidence of COPD.  He was referred to pulmonology for further evaluation.  Today presents for 63-month follow-up.  He admits to stable dyspnea on exertion, wonders if this is related to progression of his COPD.  Conversely he has not seen a pulmonologist yet.  Admits to chronic, intermittent dizziness that he has had ever since his strokes, confirms also hx of TIA's in the past.  Admits to random episodes of dizziness.  For example, while watching TV he admits to spinning sensation.  Says he has been taking meclizine consistently, says this medicine does not work.  Says he has been told he has vertigo, however he disagrees with this.   In addition to dyspnea on exertion, he also admits to palpitations and feeling like his heart rate is beating up with walking distances. Denies any syncope, presyncope, orthopnea, PND, swelling or significant weight changes, acute bleeding, or claudication.  ROS: Negative. See HPI.   Studies Reviewed: Marland Kitchen    EKG: EKG is not ordered today.   Lexiscan 04/2023:    Stress ECG is negative for ischemia and arrhythmias.   LV perfusion is normal. There is no evidence of ischemia. There is no evidence of infarction.   Left ventricular function is normal. Nuclear stress EF: 55%. End diastolic cavity size is moderately enlarged. End systolic cavity size is normal.   Findings are consistent with no ischemia and no infarction. The study is low risk.       Echo 02/2023:  1. Left ventricular ejection fraction, by estimation, is 50%. The left  ventricle has low normal function. The left ventricle has no regional wall  motion abnormalities. There is moderate concentric left ventricular  hypertrophy. Left ventricular diastolic   parameters are consistent with Grade I diastolic dysfunction (impaired  relaxation).   2. Right ventricular systolic function is normal. The right ventricular  size is normal. Tricuspid regurgitation signal is inadequate for assessing  PA pressure.   3. The mitral valve is normal in structure. Trivial mitral valve  regurgitation. No evidence of mitral stenosis.   4. The aortic valve has an indeterminant number of cusps. Aortic valve  regurgitation is not visualized. No  aortic stenosis is present.   5. The inferior vena cava is normal in size with greater than 50%  respiratory variability, suggesting right atrial pressure of 3 mmHg.   Comparison(s): No significant change from prior study.  ABI's 11/2019:  Right ABI: 1.43, Left ABI: 1.32  LHC 11/2017: There is moderate left ventricular systolic dysfunction. The left ventricular ejection fraction is 35-45% by visual  estimate. LV end diastolic pressure is mildly elevated. ----------------------------------------- ANGIOGRAPHY- PCI LESION #1: Mid LAD lesion is 80% stenosed. A drug-eluting stent was successfully placed using a STENT SYNERGY DES 2.25X16. -Postdilated to 2.4 mm Post intervention, there is a 0% residual stenosis. LESION #2: mid LAD to Dist LAD lesion is 90% stenosed. A drug-eluting stent was successfully placed using a STENT SYNERGY DES 2.25X12. -Postdilated to 2.4 mm Post intervention, there is a 0% residual stenosis. Prox LAD lesion is 40% stenosed -prior to lesion 1; apical, after lesion 2 A drug-eluting stent was successfully placed using a STENT SYNERGY DES 2.25X12. -Postdilated to 2.4 mm ----------------------------------------- Post intervention, there is a 0% residual stenosis. Trifurcation 90% lesion at the distal circumflex into LPL and PDA -small caliber distal vessels. Best treated medically as this is not a very good PCI target Prox small caliber nondominant RCA lesion is 99% stenosed.   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   Subtotally occluded nondominant RCA Moderately reduced LVEF of roughly 40% with mildly elevated LVEDP.   Plan: Transfer to postprocedure unit for post PCI monitoring and TR removal. Continue aggressive risk factor modification with statin and beta-blocker etc.   Recommend uninterrupted dual antiplatelet therapy with Aspirin 81mg  [name] and Ticagrelor 90mg  twice [name] for a minimum of 12 months (ACS - Class I recommendation). -Would strongly consider reducing to 60 mg dose ticagrelor at 9-12 months and continue for an additional year.   Would be okay to stop aspirin after 3 months if necessary.  Okay to stop Brilinta after 6 months for procedures if necessary.   Physical Exam:   VS:  BP 118/80   Pulse 85   Ht 6'  (1.829 m)   Wt 242 lb (109.8 kg)   SpO2 98%   BMI 32.82 kg/m    Wt Readings from Last 3 Encounters:  06/25/23 242 lb (109.8 kg)  03/08/23 240 lb (108.9 kg)  02/20/23 237 lb 9.6 oz (107.8 kg)    GEN: Well nourished, well developed in no acute distress NECK: No JVD; No carotid bruits CARDIAC: S1/S2, RRR, no murmurs, rubs, gallops RESPIRATORY:  Clear to auscultation without rales, wheezing or rhonchi  ABDOMEN: Soft, non-tender, non-distended EXTREMITIES:  No edema; No deformity   ASSESSMENT AND PLAN: .    CAD Denies any chest pain, but admits to DOE and symptoms of COPD. Reviewed and discussed recent normal stress test and unremarkable Echo. PFT's were arranged that revealed some evidence of COPD. Has been referred to pulmonology and waiting for initial appt. Continue ASA, Plavix, Repatha, Zetia, Fenofibrate, and NTG PRN. Heart healthy diet and regular cardiovascular exercise encouraged. Care and ED precautions discussed.   HFpEF Stage C, NYHA class I-II symptoms. ICM. Recent Echo showed normal LVEF. Euvolemic and well compensated on exam. Continue current GDMT. GDMT limited by chronic dizziness - see below. Low sodium diet, fluid restriction <2L, and [name] weights encouraged. Educated to contact our office for weight gain  of 2 lbs overnight or 5 lbs in one week.  Palpitations Associated with his dyspnea on exertion, most likely felt to be due to his COPD.  Will arrange 3-day ZIO XT monitor for further evaluation.  Heart rate is well-controlled today.  No medication changes at this time.  Care and ED precautions discussed.  HLD No recent lab work on file.  Will obtain FLP and CMET.  Continue current medication regimen. Heart healthy diet and regular cardiovascular exercise encouraged.   Hx of CVA's/TIA's chronic dizziness Seems to have chronic dizziness that is a deficit since his past strokes/TIAs.  Meclizine does not help with his dizziness-does not point to diagnosis of vertigo. Will  remove medication off his list and instructed him that he can stop this. Will arrange carotid duplex for further evaluation.  Arranging monitor as noted above. No symptoms of orthostatic dizziness. Will obtain labs. Continue to follow with PCP.  Care and ED precautions discussed.  COPD, DOE Could be possible progression of his COPD, recent cardiac workup reasurring.  Recent PFT showed some evidence of COPD.  He has been referred to pulmonology and waiting on initial evaluation.  Follow-up with pulmonology as scheduled.  Continue follow-up with PCP.  No medication changes at this time.  Care and ED precautions discussed.   Dispo: Follow-up with me/APP in 6 to 8 weeks or sooner if any changes.  Signed, Lasalle Pointer, NP

## 2023-06-25 NOTE — Patient Instructions (Addendum)
 Medication Instructions:  Your physician recommends that you continue on your current medications as directed. Please refer to the Current Medication list given to you today.  Labwork: In 2-3 weeks at [facility]   Testing/Procedures: Your physician has requested that you have a carotid duplex. This test is an ultrasound of the carotid arteries in your neck. It looks at blood flow through these arteries that supply the brain with blood. Allow one hour for this exam. There are no restrictions or special instructions. Your physician has recommended that you wear a Zio monitor.   This monitor is a medical device that records the heart's electrical activity. Doctors most often use these monitors to diagnose arrhythmias. Arrhythmias are problems with the speed or rhythm of the heartbeat. The monitor is a small device applied to your chest. You can wear one while you do your normal daily activities. While wearing this monitor if you have any symptoms to push the button and record what you felt. Once you have worn this monitor for the period of time provider prescribed (for 3 days), you will return the monitor device in the postage paid box. Once it is returned they will download the data collected and provide us  with a report which the provider will then review and we will call you with those results. Important tips:  Avoid showering during the first 24 hours of wearing the monitor. Avoid excessive sweating to help maximize wear time. Do not submerge the device, no hot tubs, and no swimming pools. Keep any lotions or oils away from the patch. After 24 hours you may shower with the patch on. Take brief showers with your back facing the shower head.  Do not remove patch once it has been placed because that will interrupt data and decrease adhesive wear time. Push the button when you have any symptoms and write down what you were feeling. Once you have completed wearing your monitor, remove and place into box  which has postage paid and place in your outgoing mailbox.  If for some reason you have misplaced your box then call our office and we can provide another box and/or mail it off for you.  Follow-Up: Your physician recommends that you schedule a follow-up appointment in: 6-8 weeks   Any Other Special Instructions Will Be Listed Below (If Applicable).  If you need a refill on your cardiac medications before your next appointment, please call your pharmacy.

## 2023-06-26 ENCOUNTER — Ambulatory Visit: Attending: Nurse Practitioner

## 2023-06-26 DIAGNOSIS — R002 Palpitations: Secondary | ICD-10-CM

## 2023-07-03 ENCOUNTER — Encounter

## 2023-07-03 ENCOUNTER — Encounter: Payer: Self-pay | Admitting: Nurse Practitioner

## 2023-07-05 DIAGNOSIS — R002 Palpitations: Secondary | ICD-10-CM | POA: Diagnosis not present

## 2023-07-13 DIAGNOSIS — N183 Chronic kidney disease, stage 3 unspecified: Secondary | ICD-10-CM | POA: Diagnosis not present

## 2023-07-13 DIAGNOSIS — R42 Dizziness and giddiness: Secondary | ICD-10-CM | POA: Diagnosis not present

## 2023-07-13 DIAGNOSIS — E782 Mixed hyperlipidemia: Secondary | ICD-10-CM | POA: Diagnosis not present

## 2023-07-13 DIAGNOSIS — N179 Acute kidney failure, unspecified: Secondary | ICD-10-CM | POA: Diagnosis not present

## 2023-07-19 ENCOUNTER — Ambulatory Visit: Attending: Cardiology

## 2023-07-19 DIAGNOSIS — I2510 Atherosclerotic heart disease of native coronary artery without angina pectoris: Secondary | ICD-10-CM | POA: Diagnosis not present

## 2023-07-19 DIAGNOSIS — R42 Dizziness and giddiness: Secondary | ICD-10-CM

## 2023-07-22 ENCOUNTER — Other Ambulatory Visit: Payer: Self-pay | Admitting: "Endocrinology

## 2023-07-26 ENCOUNTER — Encounter: Payer: Self-pay | Admitting: Nurse Practitioner

## 2023-07-26 ENCOUNTER — Ambulatory Visit: Payer: Self-pay

## 2023-07-26 DIAGNOSIS — N189 Chronic kidney disease, unspecified: Secondary | ICD-10-CM

## 2023-07-26 DIAGNOSIS — N179 Acute kidney failure, unspecified: Secondary | ICD-10-CM

## 2023-07-26 NOTE — Telephone Encounter (Signed)
 Spoke with patient he is agreeable to do the labs but is curious as far as the lipid clinic if he could go to [facility] in [place] to see [employer], AGNP-BC he currently sees her for endocrinology and for his sugar

## 2023-07-30 DIAGNOSIS — R002 Palpitations: Secondary | ICD-10-CM | POA: Diagnosis not present

## 2023-07-31 DIAGNOSIS — E039 Hypothyroidism, unspecified: Secondary | ICD-10-CM | POA: Diagnosis not present

## 2023-07-31 DIAGNOSIS — E1165 Type 2 diabetes mellitus with hyperglycemia: Secondary | ICD-10-CM | POA: Diagnosis not present

## 2023-08-05 NOTE — Progress Notes (Deleted)
 Devin [name], male    DOB: Aug 04, 1970    MRN: [id number]   Brief patient profile:  ***  yo*** *** referred to pulmonary clinic in [place]  08/09/2023 by *** for ***      History of Present Illness  08/09/2023  Pulmonary/ 1st office eval/ [facility] / [place] [facility]  No chief complaint on file.    Dyspnea:  *** Cough: *** Sleep: *** SABA use: *** 02: *** LDSCT:***  No obvious day to day or daytime pattern/variability or assoc excess/ purulent sputum or mucus plugs or hemoptysis or cp or chest tightness, subjective wheeze or overt sinus or hb symptoms.    Also denies any obvious fluctuation of symptoms with weather or environmental changes or other aggravating or alleviating factors except as outlined above   No unusual exposure hx or h/o childhood pna/ asthma or knowledge of premature birth.  Current Allergies, Complete Past Medical History, Past Surgical History, Family History, and Social History were reviewed in [employer] record.  ROS  The following are not active complaints unless bolded Hoarseness, sore throat, dysphagia, dental problems, itching, sneezing,  nasal congestion or discharge of excess mucus or purulent secretions, ear ache,   fever, chills, sweats, unintended wt loss or wt gain, classically pleuritic or exertional cp,  orthopnea pnd or arm/hand swelling  or leg swelling, presyncope, palpitations, abdominal pain, anorexia, nausea, vomiting, diarrhea  or change in bowel habits or change in bladder habits, change in stools or change in urine, dysuria, hematuria,  rash, arthralgias, visual complaints, headache, numbness, weakness or ataxia or problems with walking or coordination,  change in mood or  memory.            Outpatient Medications Prior to Visit  Medication Sig Dispense Refill   aspirin  81 MG EC tablet Take 1 tablet (81 mg total) by mouth daily. With food 30 tablet 0   clopidogrel  (PLAVIX ) 75 MG tablet Take 1 tablet (75 mg  total) by mouth daily. 30 tablet 1   Continuous Blood Gluc Receiver (FREESTYLE LIBRE 2 READER) DEVI USE AS DIRECTED 1 each 0   Continuous Blood Gluc Sensor (FREESTYLE LIBRE 2 SENSOR) MISC APPLY 1 PATCH TO SKIN ONCE EVERY 14 DAYS 2 each 2   empagliflozin  (JARDIANCE ) 10 MG TABS tablet TAKE 1 TABLET BY MOUTH DAILY BEFORE BREAKFAST 60 tablet 0   EUTHYROX  200 MCG tablet Take 200 mcg by mouth at bedtime.     Evolocumab  (REPATHA  SURECLICK) 140 MG/ML SOAJ Inject 1 pen into the skin every 14 (fourteen) days. 1 mL 11   ezetimibe  (ZETIA ) 10 MG tablet Take 1 tablet (10 mg total) by mouth daily. 30 tablet 1   famotidine  (PEPCID ) 40 MG tablet Take 40 mg by mouth daily.     fenofibrate  (TRICOR ) 145 MG tablet Take 1 tablet (145 mg total) by mouth daily. 30 tablet 1   fluticasone (FLONASE) 50 MCG/ACT nasal spray Place 2 sprays into both nostrils 2 (two) times daily.     furosemide  (LASIX ) 20 MG tablet Take 1 tablet (20 mg total) by mouth daily as needed. 30 tablet 6   gabapentin  (NEURONTIN ) 300 MG capsule One po q AM, one po q PM and 2 po qHS (Patient taking differently: Take 300 mg by mouth 2 (two) times daily.) 120 capsule 11   glucose blood (ACCU-CHEK GUIDE) test strip Test glucose 2 times a day 100 each 2   icosapent  Ethyl (VASCEPA ) 1 g capsule Take 2 capsules by mouth twice  daily 360 capsule 0   Insulin  Pen Needle (B-D ULTRAFINE III SHORT PEN) 31G X 8 MM MISC Use as directed to inject insulin  2 times daily. 200 each 1   LANTUS  SOLOSTAR 100 UNIT/ML Solostar Pen Inject 60 Units into the skin daily.     levothyroxine  (SYNTHROID ) 88 MCG tablet Take 88 mcg by mouth daily.     lipase/protease/amylase (CREON) 36000 UNITS CPEP capsule Take 36,000 Units by mouth. Samples given - Take 3 with meals     methocarbamol  (ROBAXIN ) 500 MG tablet Take 500 mg by mouth 3 (three) times daily as needed for muscle spasms.     nitroGLYCERIN  (NITROSTAT ) 0.4 MG SL tablet Place 1 tablet (0.4 mg total) under the tongue every 5 (five)  minutes as needed for chest pain. 25 tablet 3   NOVOLOG  FLEXPEN 100 UNIT/ML FlexPen Inject into the skin 3 (three) times daily with meals. 20 units with meals     pantoprazole  (PROTONIX ) 40 MG tablet TAKE 1 TABLET BY MOUTH DAILY 30 tablet 3   sitaGLIPtin  (JANUVIA ) 50 MG tablet Take 1 tablet (50 mg total) by mouth daily. 90 tablet 1   VENTOLIN  HFA 108 (90 Base) MCG/ACT inhaler Inhale into the lungs.     No facility-administered medications prior to visit.    Past Medical History:  Diagnosis Date   Asthma    Chest pain    CHF (congestive heart failure) (HCC)    CKD (chronic kidney disease) 11/13/2017   Coronary artery disease    a. s/p DES x2 to LAD and DES to OM in 11/2017   Depression    Dyspnea    GERD (gastroesophageal reflux disease)    Headache    History of kidney stones    History of rhabdomyolysis    Hyperlipidemia    Hypertension    Hypothyroidism    Ischemic cardiomyopathy    a. 11/2017: echo showing EF of 35-40%, diffuse HK, and Grade 2 DD   Kidney calculus 2014   Pericardial effusion    Small, by dobutamine echocardiogram, 04/2006   PONV (postoperative nausea and vomiting)    Sleep apnea    does not wear cpap   Stroke (HCC)    Tobacco abuse    Type 2 diabetes mellitus without complications (HCC) 10/07/2013   Vitamin D  deficiency       Objective:     There were no vitals taken for this visit.         Assessment   No problem-specific Assessment & Plan notes found for this encounter.     Vernestine Gondola, MD 08/05/2023

## 2023-08-09 ENCOUNTER — Ambulatory Visit: Admitting: Internal Medicine

## 2023-08-09 ENCOUNTER — Ambulatory Visit: Admitting: Nurse Practitioner

## 2023-08-21 ENCOUNTER — Encounter: Payer: Self-pay | Admitting: Nurse Practitioner

## 2023-08-21 ENCOUNTER — Ambulatory Visit: Attending: Nurse Practitioner | Admitting: Nurse Practitioner

## 2023-08-21 VITALS — BP 128/84 | HR 82 | Ht 77.0 in | Wt 233.0 lb

## 2023-08-21 DIAGNOSIS — Z8673 Personal history of transient ischemic attack (TIA), and cerebral infarction without residual deficits: Secondary | ICD-10-CM | POA: Diagnosis not present

## 2023-08-21 DIAGNOSIS — J449 Chronic obstructive pulmonary disease, unspecified: Secondary | ICD-10-CM | POA: Diagnosis not present

## 2023-08-21 DIAGNOSIS — R0609 Other forms of dyspnea: Secondary | ICD-10-CM

## 2023-08-21 DIAGNOSIS — I2510 Atherosclerotic heart disease of native coronary artery without angina pectoris: Secondary | ICD-10-CM

## 2023-08-21 DIAGNOSIS — Z758 Other problems related to medical facilities and other health care: Secondary | ICD-10-CM

## 2023-08-21 DIAGNOSIS — R42 Dizziness and giddiness: Secondary | ICD-10-CM | POA: Diagnosis not present

## 2023-08-21 DIAGNOSIS — H5500 Unspecified nystagmus: Secondary | ICD-10-CM

## 2023-08-21 DIAGNOSIS — I5032 Chronic diastolic (congestive) heart failure: Secondary | ICD-10-CM

## 2023-08-21 DIAGNOSIS — E781 Pure hyperglyceridemia: Secondary | ICD-10-CM

## 2023-08-21 DIAGNOSIS — R002 Palpitations: Secondary | ICD-10-CM

## 2023-08-21 DIAGNOSIS — E785 Hyperlipidemia, unspecified: Secondary | ICD-10-CM

## 2023-08-21 NOTE — Patient Instructions (Signed)
 Medication Instructions:   Your physician recommends that you continue on your current medications as directed. Please refer to the Current Medication list given to you today.   Labwork: None today  Testing/Procedures: None today  Follow-Up: 3 months  Any Other Special Instructions Will Be Listed Below (If Applicable).   You have been referred to Neurology, they will call you to schedule an appointment.  You were given a primary provider list for [facility] doctors at tthis visit.    If you need a refill on your cardiac medications before your next appointment, please call your pharmacy.

## 2023-08-21 NOTE — Progress Notes (Signed)
 Cardiology Office Note:  .   Date:  08/21/2023 ID:  Devin [name], DOB [birth year], MRN [id number] PCP: Lauran Pollard, MD  [facility] Providers Cardiologist:  Armida Lander, MD    History of Present Illness: .   Devin [name] is a 53 y.o. male with a PMH of CAD, CHF, chest pain, COPD, DOE, HLD, hx of CVA ,s/p image-guided cerebral angiogram with revascularization of vertebrobasilar junction/proximal basilar artery stenosis using stent assisted angioplasty in 2019 by IR, later on had ISR requiring repeat procedure, who presents today for 3 month follow-up.   History of 2 drug-eluting stents to LAD in 2019, DES to OM, trifurcation lesion distal to left circumflex, PDA and LPL Rex for medical therapy, with poor PCI target.  RCA was found to be a small vessel, recommended be treated medically.  Interventional cardiology recommended extended DAPT.  EF in 2019 35 to 40%, repeat EF in 2021 50 to 55%.  Last seen by Dr. Armida Lander on February 20, 2023.  He admitted to recent shortness of breath/dyspnea on exertion.  Denied any chest pain.  Echocardiogram was arranged.  Echo revealed normal LVEF, grade 1 DD.  Lexiscan  was arranged in February 2025 and revealed no evidence of ischemia or infarction, study felt to be low risk.  PFTs were arranged -revealed some evidence of COPD.  He was referred to pulmonology for further evaluation.  06/25/2023 - Today presents for 62-month follow-up.  He admits to stable dyspnea on exertion, wonders if this is related to progression of his COPD.  Conversely he has not seen a pulmonologist yet.  Admits to chronic, intermittent dizziness that he has had ever since his strokes, confirms also hx of TIA's in the past.  Admits to random episodes of dizziness.  For example, while watching TV he admits to spinning sensation.  Says he has been taking meclizine  consistently, says this medicine does not work.  Says he has been told he has vertigo, however he disagrees  with this.  In addition to dyspnea on exertion, he also admits to palpitations and feeling like his heart rate is beating up with walking distances. Denies any syncope, presyncope, orthopnea, PND, swelling or significant weight changes, acute bleeding, or claudication.  08/21/2023 - Here for follow-up. Admits to stable symptoms and random episodes of dizziness, also admits to nystagmus. Expresses frustration in primary care. Denies any chest pain, syncope, presyncope, orthopnea, PND, swelling or significant weight changes, acute bleeding, or claudication. Says he does note some palpitations at times, didn't notice this as much while wearing the monitor.   ROS: Negative. See HPI.   Studies Reviewed: Aaron Aas    EKG:  EKG Interpretation Date/Time:  Tuesday August 21 2023 11:17:18 EDT Ventricular Rate:  82 PR Interval:  146 QRS Duration:  98 QT Interval:  378 QTC Calculation: 441 R Axis:   117  Text Interpretation: Normal sinus rhythm Incomplete right bundle branch block Left posterior fascicular block When compared with ECG of 08-Mar-2023 17:21, PREVIOUS ECG IS PRESENT Confirmed by Lasalle Pointer [phone]) on 08/21/2023 11:25:29 AM   Carotid duplex 07/2023:  Summary:  Right Carotid: Velocities in the right ICA are consistent with a 1-39%  stenosis. Non-hemodynamically significant plaque <50% noted in the  CCA. The ECA appears <50% stenosed.   Left Carotid: Velocities in the left ICA are consistent with a 40-59%  stenosis. Non-hemodynamically significant plaque <50% noted in the  CCA. The ECA appears >50% stenosed.   Vertebrals:  Bilateral vertebral arteries  demonstrate antegrade flow.  Subclavians: Normal flow hemodynamics were seen in bilateral subclavian arteries.   *See table(s) above for measurements and observations.  Suggest follow up study in 12 months.  Cardiac monitor 07/2023:    3 day monitor   Rare supraventricular in the form of isolated PACs, couplets.   Rare ventricular ectopy in  the form of isolated PVCs, couplets, triplets.   Reported symptoms correlated with sinus tachycardia 101 to 125 bpm.   No signifiant arrhythmias     Patch Wear Time:  3 days and 2 hours (2025-04-14T16:48:50-0400 to 2025-04-17T19:36:53-0400)   Patient had a min HR of 50 bpm, max HR of 133 bpm, and avg HR of 82 bpm. Predominant underlying rhythm was Sinus Rhythm. Isolated SVEs were rare (<1.0%), SVE Couplets were rare (<1.0%), and no SVE Triplets were present. Isolated VEs were rare (<1.0%,  10), VE Couplets were rare (<1.0%, 1), and VE Triplets were rare (<1.0%, 1). Lexiscan  04/2023:    Stress ECG is negative for ischemia and arrhythmias.   LV perfusion is normal. There is no evidence of ischemia. There is no evidence of infarction.   Left ventricular function is normal. Nuclear stress EF: 55%. End diastolic cavity size is moderately enlarged. End systolic cavity size is normal.   Findings are consistent with no ischemia and no infarction. The study is low risk.       Echo 02/2023:  1. Left ventricular ejection fraction, by estimation, is 50%. The left  ventricle has low normal function. The left ventricle has no regional wall  motion abnormalities. There is moderate concentric left ventricular  hypertrophy. Left ventricular diastolic   parameters are consistent with Grade I diastolic dysfunction (impaired  relaxation).   2. Right ventricular systolic function is normal. The right ventricular  size is normal. Tricuspid regurgitation signal is inadequate for assessing  PA pressure.   3. The mitral valve is normal in structure. Trivial mitral valve  regurgitation. No evidence of mitral stenosis.   4. The aortic valve has an indeterminant number of cusps. Aortic valve  regurgitation is not visualized. No aortic stenosis is present.   5. The inferior vena cava is normal in size with greater than 50%  respiratory variability, suggesting right atrial pressure of 3 mmHg.   Comparison(s): No  significant change from prior study.  ABI's 11/2019:  Right ABI: 1.43, Left ABI: 1.32  LHC 11/2017: There is moderate left ventricular systolic dysfunction. The left ventricular ejection fraction is 35-45% by visual estimate. LV end diastolic pressure is mildly elevated. ----------------------------------------- ANGIOGRAPHY- PCI LESION #1: Mid LAD lesion is 80% stenosed. A drug-eluting stent was successfully placed using a STENT SYNERGY DES 2.25X16. -Postdilated to 2.4 mm Post intervention, there is a 0% residual stenosis. LESION #2: mid LAD to Dist LAD lesion is 90% stenosed. A drug-eluting stent was successfully placed using a STENT SYNERGY DES 2.25X12. -Postdilated to 2.4 mm Post intervention, there is a 0% residual stenosis. Prox LAD lesion is 40% stenosed -prior to lesion 1; apical, after lesion 2 A drug-eluting stent was successfully placed using a STENT SYNERGY DES 2.25X12. -Postdilated to 2.4 mm ----------------------------------------- Post intervention, there is a 0% residual stenosis. Trifurcation 90% lesion at the distal circumflex into LPL and PDA -small caliber distal vessels. Best treated medically as this is not a very good PCI target Prox small caliber nondominant RCA lesion is 99% stenosed.   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   Subtotally occluded nondominant RCA Moderately reduced LVEF of roughly 40% with mildly elevated LVEDP.   Plan: Transfer to postprocedure unit for post PCI monitoring and TR removal. Continue aggressive risk factor modification with statin and beta-blocker etc.   Recommend uninterrupted dual antiplatelet therapy with Aspirin  81mg  daily and Ticagrelor  90mg  twice daily for a minimum of 12 months (ACS - Class I recommendation). -Would strongly consider reducing to 60 mg dose ticagrelor  at 9-12 months and  continue for an additional year.   Would be okay to stop aspirin  after 3 months if necessary.  Okay to stop Brilinta  after 6 months for procedures if necessary.   Physical Exam:   VS:  BP 128/84 (BP Location: Right Arm, Patient Position: Sitting, Cuff Size: Normal)   Pulse 82   Ht 6' 5 (1.956 m)   Wt 233 lb (105.7 kg)   SpO2 94%   BMI 27.63 kg/m    Wt Readings from Last 3 Encounters:  08/21/23 233 lb (105.7 kg)  06/25/23 242 lb (109.8 kg)  03/08/23 240 lb (108.9 kg)    GEN: Well nourished, well developed in no acute distress NECK: No JVD; No carotid bruits CARDIAC: S1/S2, RRR, no murmurs, rubs, gallops RESPIRATORY:  Clear to auscultation without rales, wheezing or rhonchi  ABDOMEN: Soft, non-tender, non-distended EXTREMITIES:  No edema; No deformity   ASSESSMENT AND PLAN: .    CAD Denies any chest pain, but admits to DOE and symptoms of COPD. Reviewed and discussed recent normal stress test and unremarkable Echo. PFT's were arranged that revealed some evidence of COPD. Has been referred to pulmonology and has appt in July. Continue current medication regimen. Heart healthy diet and regular cardiovascular exercise encouraged. Care and ED precautions discussed.   HFpEF Stage C, NYHA class I-II symptoms. ICM. Recent Echo showed normal LVEF. Euvolemic and well compensated on exam. Continue current GDMT. GDMT limited by chronic dizziness - see below. Low sodium diet, fluid restriction <2L, and daily weights encouraged. Educated to contact our office for weight gain of 2 lbs overnight or 5 lbs in one week.  Palpitations Associated with his dyspnea on exertion, most likely felt to be due to his COPD.  See most recent monitor report - unremarkable. Heart rate is well-controlled today.  No medication changes at this time.  GDMT limited d/t his dizziness. Care and ED precautions discussed.  HLD, hypertriglcyeridemia Most recent LDL 128. TC 230, TG 429. Being managed by PCP, he has deferred  this to his PCP instead of lipid clinic. Continue current medication regimen. Heart healthy diet and regular cardiovascular exercise encouraged.   Hx of CVA's/TIA's chronic dizziness, nystagmus Seems to have chronic dizziness that is a deficit since his past strokes/TIAs.  Meclizine  does not help with his dizziness-does not point to diagnosis of vertigo. Most recent carotid duplex revealed 1-39% stenosis along RICA and 40-59% stenosis along LICA.  No symptoms of orthostatic dizziness. Continue to follow with PCP.  Care and ED precautions discussed. Admits to nystagmus during dizziness episodes, does not sound cardiac related. Will refer to Neuro for further evaluation.   COPD, DOE Could be possible progression of his COPD, recent cardiac workup reasurring.  Recent PFT showed some evidence of COPD.  He has been referred to pulmonology has appt next month. Follow-up with pulmonology as scheduled.  Continue follow-up with PCP.  No medication changes at this time.  Care and ED precautions  discussed.  7. Needs a PCP/ Does not have a PCP Will provide a list of PCP per his request.    Dispo: Follow-up with me/APP in 3 months or sooner if any changes.  Signed, Lasalle Pointer, NP

## 2023-08-28 ENCOUNTER — Other Ambulatory Visit (INDEPENDENT_AMBULATORY_CARE_PROVIDER_SITE_OTHER): Payer: Self-pay | Admitting: Gastroenterology

## 2023-09-07 ENCOUNTER — Encounter (HOSPITAL_COMMUNITY): Payer: Self-pay | Admitting: Interventional Radiology

## 2023-09-07 ENCOUNTER — Ambulatory Visit: Payer: Self-pay | Admitting: Nurse Practitioner

## 2023-09-09 NOTE — Progress Notes (Unsigned)
 [facility]  PATIENT: Devin [name] DOB: December 15, 1970  REFERRING DOCTOR OR PCP: [facility] SOURCE: Patient, notes from primary care, imaging reports and MRI and CTA images personally reviewed  _________________________________   HISTORICAL  CHIEF COMPLAINT:  No chief complaint on file.   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  Update 02/15/2022 He reports more difficulty with vision and other symptoms over the past few months.   He has several minutes of tunnel vision, most easily noted while watching TV. He has these 3 times a week x 3-4 minutes.    He also has a left ringing in the ear when this occurs - this can last for hours.   He has other episodes of room spinning vertigo, sometimes awakening him.  He notes balance is poor when any ot the visual or spinning events occurs.  .    With 1 recent episode in October, symptoms were more severe.  His wife reports that he also had short loss of consciousness without episode and the ambulance was called to take him to the emergency room.  MRI of the head 01/06/2022 showed the old strokes in the brainstem and cerebellum.  Additionally there was a report of a 3 mm acute stroke in the right frontal lobe.  I reviewed the images and am uncertain if this actually represents a small acute ischemic focus as it does not appear on coronal diffusion weighted images nor on FLAIR or T2 weighted images.  Of note, the internal carotid artery stenosis is on the left not the right.  CT angiogram 01/05/2022 confirmed the findings from earlier in the year of restenosis in the vertebral stent and stenosis in the distal right V4 segment.  Additionally there is hemodynamically significant stenosis in the left anterior carotid artery.  The stroke August 2019 that has  affected the left strength and sensation.  This followed stents in the heart.    He notes numbness and pain in the left arm worse than leg since 2019 (CVA)    The numbness is in arm and hand.  Quality is tingling and can be painful at times when it intensifies.  He has intermittent neck pain and tingling.    He had vertebral artery disease and underwent vertebral artery stenting 11/2017 and 2nd intervention for re-stenosis within the stent 02/2018 (Dr. Dolphus).   He had multiple small pontine/left occipital lobe/cerebellum strokes 01/06/2018.      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  Hyperlipidemia  He has been on Plavix  and bASA since 2019  He is on Repatha  and Zetia , fenofibrate , and Vascepa  for lipids.      He saw Dr. Dolphus 07/28/2021 who noted 80-85% stenosis LICA and restenosis left vertebral stent near VB junction.  Before considering a procedure he had wanted input from nephrology.  mr. Nicolson his nephrologist has said that he could undergo angiographic procedures  He gets occasional headaches and sometimes has nausea when ne occurs.  He has 2-3 mild HA every week and one severe one with migrainous features.  He usually does not take any medicine but just lays down with the more severe ones until they  pass 4 to 6 hours later  IMAGING MRI of the head 08/18/2022 showed remote lacunar infarctions in the left cerebellar hemisphere and small focus in the right pons.  No acute findings.  MRI brain10/27/2023 showed  Remote left cerebellar lacunar infarction, small T2 hyperintense focus within the right pons.  A 3 mm focus of apparent signal abnormality within the right frontoparietal white matter mentioned in the official report is likely artifact. Small scattered foci of T2 FLAIR hyperintense signal abnormality within the  cerebral white matter, nonspecific but likely reflecting minimal changes of chronic small vessel ischemia.   CT/CTA 01/05/2022 IMPRESSION: 1. No acute intracranial process. 2. No intracranial large vessel occlusion. Unchanged severe stenosis in the proximal aspect of the vertebrobasilar stent. 3. Unchanged approximately 65% stenosis at the origin of the left ICA.   I reviewed multiple imaging studies: CTA head/neck 10/15/2019 showed patent stent from left V4 to basilar artery and stable occluded distal right V4 segment.  MRI of the brain and MR angiogram 07/28/2018: MRI   MR angiogram shows occlusion of the distal right vertebral artery and normal flow in the left vertebral artery and the basilar artery  MRI of the brain January 06, 2018 showed several very small strokes in the distribution of the left vertebral and basilar artery.     REVIEW OF SYSTEMS: Constitutional: No fevers, chills, sweats, or change in appetite Eyes: No visual changes, double vision, eye pain Ear, nose and throat: No hearing loss, ear pain, nasal congestion, sore throat Cardiovascular: No chest pain, palpitations Respiratory:  No shortness of breath at rest or with exertion.   No wheezes GastrointestinaI: No nausea, vomiting, diarrhea, abdominal pain, fecal incontinence Genitourinary:  No dysuria, urinary retention or frequency.  No nocturia. Musculoskeletal:  No neck pain, back pain Integumentary: No rash, pruritus, skin lesions Neurological: as above Psychiatric: No depression at this time.  No anxiety Endocrine: No palpitations, diaphoresis, change in appetite, change in weigh or increased thirst Hematologic/Lymphatic:  No anemia, purpura, petechiae. Allergic/Immunologic: No itchy/runny eyes, nasal congestion, recent allergic reactions, rashes  ALLERGIES: Allergies  Allergen Reactions   Crestor [Rosuvastatin] Other (See Comments)    rhabdomyolosis   Trulicity [Dulaglutide] Nausea And Vomiting   Bee  Venom Swelling    SWELLING REACTION UNSPECIFIED    Morphine      Panic attack    HOME MEDICATIONS:  Current Outpatient Medications:    aspirin  81 MG EC tablet, Take 1 tablet (81 mg total) by mouth daily. With food, Disp: 30 tablet, Rfl: 0   clopidogrel  (PLAVIX ) 75 MG tablet, Take 1 tablet (75 mg total) by mouth daily., Disp: 30 tablet, Rfl: 1   Continuous Blood Gluc Receiver (FREESTYLE LIBRE 2 READER) DEVI, USE AS DIRECTED, Disp: 1 each, Rfl: 0   Continuous Blood Gluc Sensor (FREESTYLE LIBRE 2 SENSOR) MISC, APPLY 1 PATCH TO SKIN ONCE EVERY 14 DAYS, Disp: 2 each, Rfl: 2   empagliflozin  (JARDIANCE ) 10 MG TABS tablet, TAKE 1 TABLET BY MOUTH DAILY BEFORE BREAKFAST, Disp: 60 tablet, Rfl: 0   EUTHYROX  200 MCG tablet, Take 200 mcg by mouth at bedtime., Disp: , Rfl:    Evolocumab  (REPATHA  SURECLICK) 140 MG/ML SOAJ, Inject 1 pen into the skin every 14 (fourteen) days., Disp: 1 mL, Rfl: 11   ezetimibe  (ZETIA ) 10 MG tablet, Take 1 tablet (10 mg total) by mouth daily., Disp: 30 tablet, Rfl: 1   famotidine  (PEPCID ) 40 MG tablet, Take 40 mg by mouth daily., Disp: , Rfl:    fenofibrate  (TRICOR )  145 MG tablet, Take 1 tablet (145 mg total) by mouth daily., Disp: 30 tablet, Rfl: 1   fluticasone (FLONASE) 50 MCG/ACT nasal spray, Place 2 sprays into both nostrils 2 (two) times daily., Disp: , Rfl:    furosemide  (LASIX ) 20 MG tablet, Take 1 tablet (20 mg total) by mouth daily as needed., Disp: 30 tablet, Rfl: 6   gabapentin  (NEURONTIN ) 300 MG capsule, One po q AM, one po q PM and 2 po qHS (Patient taking differently: Take 300 mg by mouth 2 (two) times daily.), Disp: 120 capsule, Rfl: 11   glucose blood (ACCU-CHEK GUIDE) test strip, Test glucose 2 times a day, Disp: 100 each, Rfl: 2   icosapent  Ethyl (VASCEPA ) 1 g capsule, Take 2 capsules by mouth twice daily, Disp: 360 capsule, Rfl: 0   Insulin  Pen Needle (B-D ULTRAFINE III SHORT PEN) 31G X 8 MM MISC, Use as directed to inject insulin  2 times daily., Disp: 200  each, Rfl: 1   LANTUS  SOLOSTAR 100 UNIT/ML Solostar Pen, Inject 60 Units into the skin daily., Disp: , Rfl:    levothyroxine  (SYNTHROID ) 88 MCG tablet, Take 88 mcg by mouth daily., Disp: , Rfl:    lipase/protease/amylase (CREON) 36000 UNITS CPEP capsule, Take 36,000 Units by mouth. Samples given - Take 3 with meals, Disp: , Rfl:    methocarbamol  (ROBAXIN ) 500 MG tablet, Take 500 mg by mouth 3 (three) times daily as needed for muscle spasms., Disp: , Rfl:    nitroGLYCERIN  (NITROSTAT ) 0.4 MG SL tablet, Place 1 tablet (0.4 mg total) under the tongue every 5 (five) minutes as needed for chest pain., Disp: 25 tablet, Rfl: 3   NOVOLOG  FLEXPEN 100 UNIT/ML FlexPen, Inject into the skin 3 (three) times daily with meals. 20 units with meals, Disp: , Rfl:    pantoprazole  (PROTONIX ) 40 MG tablet, TAKE 1 TABLET BY MOUTH DAILY, Disp: 30 tablet, Rfl: 3   sitaGLIPtin  (JANUVIA ) 50 MG tablet, Take 1 tablet (50 mg total) by mouth daily., Disp: 90 tablet, Rfl: 1   VENTOLIN  HFA 108 (90 Base) MCG/ACT inhaler, Inhale into the lungs., Disp: , Rfl:   PAST MEDICAL HISTORY: Past Medical History:  Diagnosis Date   Asthma    Chest pain    CHF (congestive heart failure) (HCC)    CKD (chronic kidney disease) 11/13/2017   Coronary artery disease    a. s/p DES x2 to LAD and DES to OM in 11/2017   Depression    Dyspnea    GERD (gastroesophageal reflux disease)    Headache    History of kidney stones    History of rhabdomyolysis    Hyperlipidemia    Hypertension    Hypothyroidism    Ischemic cardiomyopathy    a. 11/2017: echo showing EF of 35-40%, diffuse HK, and Grade 2 DD   Kidney calculus 2014   Pericardial effusion    Small, by dobutamine echocardiogram, 04/2006   PONV (postoperative nausea and vomiting)    Sleep apnea    does not wear cpap   Stroke (HCC)    Tobacco abuse    Type 2 diabetes mellitus without complications (HCC) 10/07/2013   Vitamin D  deficiency     PAST SURGICAL HISTORY: Past Surgical  History:  Procedure Laterality Date   APPENDECTOMY     BIOPSY  12/07/2022   Procedure: BIOPSY;  Surgeon: Cinderella Deatrice FALCON, MD;  Location: [facility];  Service: Endoscopy;;   CARDIAC CATHETERIZATION  09/2012   Nonobstructive CAD with 30% proximal, 40%  mid LAD disease; 30% proximal CFX; EF 55-65%   CHOLECYSTECTOMY N/A 01/27/2013   Procedure: LAPAROSCOPIC CHOLECYSTECTOMY;  Surgeon: Oneil DELENA Budge, MD;  Location: [facility];  Service: General;  Laterality: N/A;   CORONARY STENT INTERVENTION N/A 11/15/2017   Procedure: CORONARY STENT INTERVENTION;  Surgeon: Anner Alm ORN, MD;  Location: [facility] INVASIVE CV LAB;  Service: Cardiovascular;  Laterality: N/A;   ESOPHAGOGASTRODUODENOSCOPY (EGD) WITH PROPOFOL  N/A 12/07/2022   Procedure: ESOPHAGOGASTRODUODENOSCOPY (EGD) WITH PROPOFOL ;  Surgeon: Cinderella Deatrice FALCON, MD;  Location: [facility];  Service: Endoscopy;  Laterality: N/A;  9:45am;asa 3   ESOPHAGOGASTRODUODENOSCOPY ENDOSCOPY  11/2022   HERNIA REPAIR     As a child   INSERTION OF MESH N/A 11/13/2012   Procedure: INSERTION OF MESH;  Surgeon: Oneil DELENA Budge, MD;  Location: [facility];  Service: General;  Laterality: N/A;   IR ANGIO INTRA EXTRACRAN SEL COM CAROTID INNOMINATE BILAT MOD SED  12/01/2017   IR ANGIO INTRA EXTRACRAN SEL COM CAROTID INNOMINATE BILAT MOD SED  02/13/2018   IR ANGIO INTRA EXTRACRAN SEL COM CAROTID INNOMINATE BILAT MOD SED  09/15/2022   IR ANGIO VERTEBRAL SEL SUBCLAVIAN INNOMINATE BILAT MOD SED  09/15/2022   IR ANGIO VERTEBRAL SEL SUBCLAVIAN INNOMINATE UNI L MOD SED  02/13/2018   IR ANGIO VERTEBRAL SEL VERTEBRAL BILAT MOD SED  12/01/2017   IR ANGIO VERTEBRAL SEL VERTEBRAL UNI R MOD SED  02/13/2018   IR INTRA CRAN STENT  12/03/2017   IR PTA INTRACRANIAL  02/25/2018   IR RADIOLOGIST EVAL & MGMT  07/29/2021   IR US  GUIDE VASC ACCESS RIGHT  02/13/2018   KIDNEY STONE SURGERY     LEFT HEART CATH AND CORONARY ANGIOGRAPHY N/A 11/15/2017   Procedure: LEFT HEART CATH AND CORONARY  ANGIOGRAPHY;  Surgeon: Anner Alm ORN, MD;  Location: [facility] INVASIVE CV LAB;  Service: Cardiovascular;  Laterality: N/A;   PERCUTANEOUS NEPHROLITHOTRIPSY     RADIOLOGY WITH ANESTHESIA Left 12/03/2017   Procedure: Angioplasty with possible stenting of left VBJ;  Surgeon: Dolphus Carrion, MD;  Location: [facility] OR;  Service: Radiology;  Laterality: Left;   RADIOLOGY WITH ANESTHESIA N/A 02/25/2018   Procedure: STENT PLACEMENT;  Surgeon: Dolphus Carrion, MD;  Location: [facility];  Service: Radiology;  Laterality: N/A;   UMBILICAL HERNIA REPAIR N/A 11/13/2012   Procedure: UMBILICAL HERNIORRHAPHY;  Surgeon: Oneil DELENA Budge, MD;  Location: [facility];  Service: General;  Laterality: N/A;   VARICOCELECTOMY      FAMILY HISTORY: Family History  Problem Relation Age of Onset   Coronary artery disease Father    Hypertension Father    Hyperlipidemia Father    Diabetes Father    Congestive Heart Failure Father 58   Arrhythmia Father        had an ICD   Diabetes Mother    Hypertension Mother    Hyperlipidemia Mother    Obesity Mother 64       died after bariatric surgery, liver failure and infection   Coronary artery disease Maternal Grandfather        both grandfathers and several uncles   Coronary artery disease Other    Diabetes Sister        both sisters   Stroke Maternal Aunt    Stroke Maternal Uncle     SOCIAL HISTORY:  Social History   Socioeconomic History   Marital status: Divorced    Spouse name: Not on file   Number of children: Not on file   Years of education: Not  on file   Highest education level: Not on file  Occupational History   Occupation: Disability     Comment:      Employer: [employer]  Tobacco Use   Smoking status: Every Day    Current packs/day: 1.00    Average packs/day: 1 pack/day for 38.5 years (38.5 ttl pk-yrs)    Types: Cigarettes    Start date: 03/13/1985    Passive exposure: Never   Smokeless tobacco: Former    Types: Snuff, Chew    Quit date: 03/14/1987   Vaping Use   Vaping status: Former   Devices: quit in 2011  Substance and Sexual Activity   Alcohol use: No   Drug use: No   Sexual activity: Not on file  Other Topics Concern   Not on file  Social History Narrative   Eats fast food daily   No exercise outside of work   Lives in [place] with his dog   MAINTENANCE TECHNICIAN   Caffeine use: tea/soda daily   Left handed    Social Drivers of Health   Financial Resource Strain: Not on file  Food Insecurity: Not on file  Transportation Needs: No Transportation Needs (09/07/2020)   Received [facility]   PRAPARE - Transportation    In the past 12 months, has lack of transportation kept you from medical appointments or from getting medications?: No    Lack of Transportation (Non-Medical): No  Physical Activity: Not on file  Stress: Not on file  Social Connections: Unknown (01/06/2022)   Received from [facility]   Social Network    Social Network: Not on file  Intimate Partner Violence: Unknown (01/06/2022)   Received from [facility]   HITS    Physically Hurt: Not on file    Insult or Talk Down To: Not on file    Threaten Physical Harm: Not on file    Scream or Curse: Not on file     PHYSICAL EXAM  There were no vitals filed for this visit.   There is no height or weight on file to calculate BMI.   General: The patient is well-developed and well-nourished and in no acute distress  HEENT:  Head is [place]/AT.  Sclera are anicteric.   Neck left carotid bruit.  The neck is nontender. :  Cardiovascular: The heart has a regular rate and rhythm with a normal S1 and S2. There were no murmurs, gallops or rubs.    Skin: Extremities are without rash or  edema.  Musculoskeletal:  Back is nontender  Neurologic Exam  Mental status: The patient is alert and oriented x 3 at the time of the examination. The patient has apparent normal recent and remote memory, with an apparently normal attention span and  concentration ability.   Speech is normal.  Cranial nerves: Extraocular movements are full. Pupils are equal, round, and reactive to light and accomodation. Redcued VA on the left vs right.   Facial symmetry is present. There is good facial sensation to soft touch bilaterally.Facial strength is normal.  Trapezius and sternocleidomastoid strength is normal. No dysarthria is noted.  The tongue is midline, and the patient has symmetric elevation of the soft palate. No obvious hearing deficits are noted.  Motor:  Muscle bulk is normal.   Tone is normal. Strength is  5 / 5 in all 4 extremities.   Sensory: He has reduced sensation to touch and vibration in the left arm and leg compared to the right side.  There is further reduction of sensation in the hand over the thenar eminence  Coordination: Cerebellar testing reveals reduced left finger-nose-finger and reduced left heel-to-shin.  Gait and station: Station is normal.   Gait is mildly wide.  Tandem is poor.. Romberg is negative.   Reflexes: Deep tendon reflexes are symmetric in the arms but increased in the left leg relative to the right leg..   Plantar responses are flexor.    DIAGNOSTIC DATA (LABS, IMAGING, TESTING) - I reviewed patient records, labs, notes, testing and imaging myself where available.  Lab Results  Component Value Date   WBC 8.2 03/08/2023   HGB 16.4 03/08/2023   HCT 49.9 03/08/2023   MCV 85.2 03/08/2023   PLT 307 03/08/2023      Component Value Date/Time   NA 137 03/08/2023 1655   NA 135 11/07/2022 1200   K 3.4 (L) 03/08/2023 1655   CL 101 03/08/2023 1655   CO2 27 03/08/2023 1655   GLUCOSE 90 03/08/2023 1655   BUN 16 03/08/2023 1655   BUN 21 11/07/2022 1200   CREATININE 0.95 03/08/2023 1655   CALCIUM 9.9 03/08/2023 1655   PROT 7.5 03/08/2023 1655   PROT 6.7 11/07/2022 1200   ALBUMIN 3.8 03/08/2023 1655   ALBUMIN 4.2 11/07/2022 1200   AST 15 03/08/2023 1655   ALT 12 03/08/2023 1655   ALKPHOS 129 (H)  03/08/2023 1655   BILITOT 0.5 03/08/2023 1655   BILITOT 0.2 11/07/2022 1200   GFRNONAA >60 03/08/2023 1655   GFRAA >60 10/15/2019 0728   Lab Results  Component Value Date   CHOL 232 (H) 06/13/2021   HDL 26 (L) 06/13/2021   LDLCALC 86 06/13/2021   LDLDIRECT 143 (H) 12/23/2019   TRIG 730 (HH) 06/13/2021   CHOLHDL 8.9 (H) 06/13/2021   Lab Results  Component Value Date   HGBA1C 12.4 (A) 12/14/2022   No results found for: [id number] Lab Results  Component Value Date   TSH 1.120 11/07/2022        ASSESSMENT AND PLAN  No diagnosis found.   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  I would like him to follow-up with Dr. Dolphus at interventional neuroradiology to determine if an additional procedure is recommended.  He also has left internal carotid artery stenosis of about 80%.  This has not been symptomatic though is at a point where an intervention may also be necessary.   Reviewing the images from 01/06/2022, I am not convinced that there was a new 3 mm stroke.  The coronal diffusion weighted images and the other sequences do not confirm that lesion so could be artifact.  If it was a tiny microvascular infarction, it is so small it would be asymptomatic and is not on the more diseased ICA side.  Continue aspirin  and Plavix . Ubrelvy 100 mg   #2 samples Lot [id number], Exp 11/2023 for migraine Return in 6 months or sooner if there are new or worsening neurologic symptoms  45-minute office visit with the majority of the time spent face-to-face for history and physical, discussion/counseling and decision-making.  Additional time with record review and documentation.   Edwardo Wojnarowski A. Vear, MD, [facility] 09/09/2023, 4:27 PM Certified in Neurology, Clinical Neurophysiology, Sleep Medicine and Neuroimaging  [facility] [address] [place], [place] [address] [phone]

## 2023-09-12 ENCOUNTER — Telehealth: Payer: Self-pay | Admitting: Neurology

## 2023-09-12 ENCOUNTER — Encounter: Payer: Self-pay | Admitting: Neurology

## 2023-09-12 ENCOUNTER — Ambulatory Visit (INDEPENDENT_AMBULATORY_CARE_PROVIDER_SITE_OTHER): Admitting: Neurology

## 2023-09-12 VITALS — BP 132/84 | HR 81 | Ht 77.0 in | Wt 237.5 lb

## 2023-09-12 DIAGNOSIS — R2689 Other abnormalities of gait and mobility: Secondary | ICD-10-CM | POA: Diagnosis not present

## 2023-09-12 DIAGNOSIS — R42 Dizziness and giddiness: Secondary | ICD-10-CM | POA: Diagnosis not present

## 2023-09-12 DIAGNOSIS — I639 Cerebral infarction, unspecified: Secondary | ICD-10-CM | POA: Diagnosis not present

## 2023-09-12 DIAGNOSIS — I6503 Occlusion and stenosis of bilateral vertebral arteries: Secondary | ICD-10-CM | POA: Diagnosis not present

## 2023-09-12 DIAGNOSIS — I2510 Atherosclerotic heart disease of native coronary artery without angina pectoris: Secondary | ICD-10-CM | POA: Diagnosis not present

## 2023-09-12 DIAGNOSIS — G450 Vertebro-basilar artery syndrome: Secondary | ICD-10-CM

## 2023-09-12 NOTE — Telephone Encounter (Signed)
 Notified Delon Amour at [facility] of interventional radiology referral for Dr. Dolphus. She will reach out to patient to schedule

## 2023-09-12 NOTE — Telephone Encounter (Signed)
 no auth required sent to GI [phone]

## 2023-09-17 NOTE — Telephone Encounter (Signed)
 It looks like he got them in 2019 and has had multiple MRIs done at [facility] since then so I will see if they can schedule him there.

## 2023-09-17 NOTE — Telephone Encounter (Addendum)
 Pt's sister is asking to be called to discuss the location of where pt will have his MRI as a result of GI saying they will not be able to as a result of not knowing where pt's stint are. Pt's sister is asking to be called since she is the person who takes pt's to his appointments.

## 2023-09-18 NOTE — Telephone Encounter (Signed)
 He is schedule at [facility] on 7/14.

## 2023-09-22 ENCOUNTER — Encounter (HOSPITAL_COMMUNITY): Payer: Self-pay

## 2023-09-24 ENCOUNTER — Ambulatory Visit (HOSPITAL_COMMUNITY)
Admission: RE | Admit: 2023-09-24 | Discharge: 2023-09-24 | Disposition: A | Discharge status: Home / Self Care | Source: Ambulatory Visit | Attending: Neurology | Admitting: Neurology

## 2023-09-24 ENCOUNTER — Ambulatory Visit: Payer: Self-pay | Admitting: Neurology

## 2023-09-24 DIAGNOSIS — G450 Vertebro-basilar artery syndrome: Secondary | ICD-10-CM | POA: Diagnosis not present

## 2023-09-24 DIAGNOSIS — R2689 Other abnormalities of gait and mobility: Secondary | ICD-10-CM | POA: Insufficient documentation

## 2023-09-24 DIAGNOSIS — R42 Dizziness and giddiness: Secondary | ICD-10-CM | POA: Diagnosis not present

## 2023-09-24 DIAGNOSIS — I639 Cerebral infarction, unspecified: Secondary | ICD-10-CM | POA: Insufficient documentation

## 2023-09-24 DIAGNOSIS — Z8673 Personal history of transient ischemic attack (TIA), and cerebral infarction without residual deficits: Secondary | ICD-10-CM | POA: Diagnosis not present

## 2023-09-24 MED ORDER — GADOBUTROL 1 MMOL/ML IV SOLN
10.0000 mL | Freq: Once | INTRAVENOUS | Status: AC | PRN
  Administered 2023-09-24: 10 mL via INTRAVENOUS

## 2023-09-25 DIAGNOSIS — E1165 Type 2 diabetes mellitus with hyperglycemia: Secondary | ICD-10-CM | POA: Diagnosis not present

## 2023-09-25 DIAGNOSIS — E039 Hypothyroidism, unspecified: Secondary | ICD-10-CM | POA: Diagnosis not present

## 2023-10-01 NOTE — Progress Notes (Unsigned)
 Devin [name], male    DOB: [birth year]    MRN: [id number]   Brief patient profile:  50  yowm  active smoker  with freq rhinitis with change of seasons  referred to pulmonary clinic in [place]  10/03/2023 by Dr Lionell  for doe onset since 40s.   Pt not previously seen by PCCM service.     History of Present Illness  10/03/2023  Pulmonary/ 1st office eval/ Devin [name] / [facility]  Chief Complaint  Patient presents with   Establish Care  Dyspnea:   7 steps to landing sometimes stops half way Flat to mb stops half way x months x 75 ft (not reproduced at ov) Also sob with voice use  Cough: sometimes after dinner in a recliner min mucoid  Sleep: 45 degrees recliner - any lower coughs  SABA use: 4x daily doesn't think it's helping  02: none   No obvious day to day or daytime pattern/variability or assoc excess/ purulent sputum or mucus plugs or hemoptysis or cp or chest tightness, subjective wheeze or overt sinus or hb symptoms.    Also denies any obvious fluctuation of symptoms with weather or environmental changes or other aggravating or alleviating factors except as outlined above   No unusual exposure hx or h/o childhood pna/ asthma or knowledge of premature birth.  Current Allergies, Complete Past Medical History, Past Surgical History, Family History, and Social History were reviewed in [employer] record.  ROS  The following are not active complaints unless bolded Hoarseness, sore throat, dysphagia, dental problems, itching, sneezing,  nasal congestion or discharge of excess mucus or purulent secretions, ear ache,   fever, chills, sweats, unintended wt loss or wt gain, classically pleuritic or exertional cp,  orthopnea pnd or arm/hand swelling  or leg swelling, presyncope, palpitations, abdominal pain, anorexia, nausea, vomiting, diarrhea  or change in bowel habits or change in bladder habits, change in stools or change in urine, dysuria, hematuria,   rash, arthralgias, visual complaints, headache, numbness, weakness or ataxia or problems with walking or coordination,  change in mood or  memory.            Outpatient Medications Prior to Visit  Medication Sig Dispense Refill   aspirin  81 MG EC tablet Take 1 tablet (81 mg total) by mouth daily. With food 30 tablet 0   clopidogrel  (PLAVIX ) 75 MG tablet Take 1 tablet (75 mg total) by mouth daily. 30 tablet 1   Continuous Blood Gluc Receiver (FREESTYLE LIBRE 2 READER) DEVI USE AS DIRECTED 1 each 0   Continuous Blood Gluc Sensor (FREESTYLE LIBRE 2 SENSOR) MISC APPLY 1 PATCH TO SKIN ONCE EVERY 14 DAYS 2 each 2   empagliflozin  (JARDIANCE ) 10 MG TABS tablet TAKE 1 TABLET BY MOUTH DAILY BEFORE BREAKFAST 60 tablet 0   EUTHYROX  200 MCG tablet Take 200 mcg by mouth at bedtime.     Evolocumab  (REPATHA  SURECLICK) 140 MG/ML SOAJ Inject 1 pen into the skin every 14 (fourteen) days. 1 mL 11   ezetimibe  (ZETIA ) 10 MG tablet Take 1 tablet (10 mg total) by mouth daily. 30 tablet 1   famotidine  (PEPCID ) 40 MG tablet Take 40 mg by mouth daily.     fenofibrate  (TRICOR ) 145 MG tablet Take 1 tablet (145 mg total) by mouth daily. 30 tablet 1   fluticasone (FLONASE) 50 MCG/ACT nasal spray Place 2 sprays into both nostrils 2 (two) times daily.     furosemide  (LASIX ) 20 MG tablet Take 1  tablet (20 mg total) by mouth daily as needed. 30 tablet 6   gabapentin  (NEURONTIN ) 300 MG capsule One po q AM, one po q PM and 2 po qHS (Patient taking differently: Take 300 mg by mouth 2 (two) times daily.) 120 capsule 11   glucose blood (ACCU-CHEK GUIDE) test strip Test glucose 2 times a day 100 each 2   icosapent  Ethyl (VASCEPA ) 1 g capsule Take 2 capsules by mouth twice daily 360 capsule 0   Insulin  Pen Needle (B-D ULTRAFINE III SHORT PEN) 31G X 8 MM MISC Use as directed to inject insulin  2 times daily. 200 each 1   LANTUS  SOLOSTAR 100 UNIT/ML Solostar Pen Inject 60 Units into the skin daily.     levothyroxine  (SYNTHROID ) 88 MCG  tablet Take 88 mcg by mouth daily.     lipase/protease/amylase (CREON) 36000 UNITS CPEP capsule Take 36,000 Units by mouth. Samples given - Take 3 with meals     methocarbamol  (ROBAXIN ) 500 MG tablet Take 500 mg by mouth 3 (three) times daily as needed for muscle spasms.     nitroGLYCERIN  (NITROSTAT ) 0.4 MG SL tablet Place 1 tablet (0.4 mg total) under the tongue every 5 (five) minutes as needed for chest pain. 25 tablet 3   NOVOLOG  FLEXPEN 100 UNIT/ML FlexPen Inject into the skin 3 (three) times daily with meals. 20 units with meals     pantoprazole  (PROTONIX ) 40 MG tablet TAKE 1 TABLET BY MOUTH DAILY 30 tablet 3   sitaGLIPtin  (JANUVIA ) 50 MG tablet Take 1 tablet (50 mg total) by mouth daily. 90 tablet 1   VENTOLIN  HFA 108 (90 Base) MCG/ACT inhaler Inhale into the lungs.     No facility-administered medications prior to visit.    Past Medical History:  Diagnosis Date   Asthma    Chest pain    CHF (congestive heart failure) (HCC)    CKD (chronic kidney disease) 11/13/2017   Coronary artery disease    a. s/p DES x2 to LAD and DES to OM in 11/2017   Depression    Dyspnea    GERD (gastroesophageal reflux disease)    Headache    History of kidney stones    History of rhabdomyolysis    Hyperlipidemia    Hypertension    Hypothyroidism    Ischemic cardiomyopathy    a. 11/2017: echo showing EF of 35-40%, diffuse HK, and Grade 2 DD   Kidney calculus 2014   Pericardial effusion    Small, by dobutamine echocardiogram, 04/2006   PONV (postoperative nausea and vomiting)    Sleep apnea    does not wear cpap   Stroke [facility])    Tobacco abuse    Type 2 diabetes mellitus without complications (HCC) 10/07/2013   Vitamin D  deficiency       Objective:     BP 125/82   Pulse 84   Ht 6' 5 (1.956 m)   Wt 235 lb 6.4 oz (106.8 kg)   SpO2 98% Comment: ra  BMI 27.91 kg/m   SpO2: 98 % (ra) amb wm nad       HEENT : Oropharynx  clear/  M3-4 Airway    Nasal turbinates nl    NECK :   without  apparent JVD/ palpable Nodes/TM    LUNGS: no acc muscle use,  Min barrel  contour chest wall with bilateral  slightly decreased bs s audible wheeze and  without cough on insp or exp maneuvers and min  Hyperresonant  to  percussion  bilaterally    CV:  RRR  no s3 or murmur or increase in P2, and no edema   ABD:  soft and nontender with pos end  insp Hoover's  in the supine position.  No bruits or organomegaly appreciated   MS:  Nl gait/ ext warm without deformities Or obvious joint restrictions  calf tenderness, cyanosis or clubbing     SKIN: warm and dry without lesions    NEURO:  alert, approp, nl sensorium with  no motor or cerebellar deficits apparent.          Assessment   COPD GOLD 2 / still smoking Active smoker - onset of doe x his 40's  - PFT's  05/28/23  FEV1 3.36 (69 % ) ratio 0.67  p 2 % improvement from saba p ? prior to study with DLCO  25.5 (71%)   and FV curve atypical:  straightened but not flattened   - 10/03/2023  After extensive coaching inhaler device,  effectiveness =    80% with SMI (short ti) >>> try spiriva  sample then fill rx for stiolto  - 10/03/2023   Walked on RA  x  2  lap(s) =  approx 300  ft  @ mod pace, stopped due to sob  with lowest 02 sats 99%   - Allergy screen 10/03/2023 >  Eos 0.   alpha one AT phenotype pending   Pt is Group B in terms of symptom/risk and laba/lama therefore appropriate rx at this point >>>  stiolto best choice here with more approp use of saba:  Re SABA :  I spent extra time with pt today reviewing appropriate use of albuterol  for prn use on exertion with the following points: 1) saba is for relief of sob that does not improve by walking a slower pace or resting but rather if the pt does not improve after trying this first. 2) If the pt is convinced, as many are, that saba helps recover from activity faster then it's easy to tell if this is the case by re-challenging : ie stop, take the inhaler, then p 5 minutes try the  exact same activity (intensity of workload) that just caused the symptoms and see if they are substantially diminished or not after saba 3) if there is an activity that reproducibly causes the symptoms, try the saba 15 min before the activity on alternate days   If in fact the saba really does help, then fine to continue to use it prn but advised may need to look closer at the maintenance regimen (stiolto in this case)  being used to achieve better control of airways disease with exertion.   F/u 3 m with all resp meds in hand using a trust but verify approach to confirm accurate Medication  Reconciliation The principal here is that until we are certain that the  patients are doing what we've asked, it makes no sense to ask them to do more.       Cigarette smoker Counseled re importance of smoking cessation but did not meet time criteria for separate billing    Low-dose CT lung cancer screening is recommended for patients who are 53-71 years of age with a 20+ pack-year history of smoking and who are currently smoking or quit <=15 years ago. No coughing up blood  No unintentional weight loss of > 15 pounds in the last 6 months - pt is eligible for scanning yearly until 15 y p quits > referred   Each maintenance  medication was reviewed in detail including emphasizing most importantly the difference between maintenance and prns and under what circumstances the prns are to be triggered using an action plan format where appropriate.  Total time for H and P, chart review, counseling, reviewing hfa/ smi  device(s) , directly observing portions of ambulatory 02 saturation study/ and generating customized AVS unique to this office visit / same day charting = 48 min new pt eval                    Ozell America, MD 10/03/2023

## 2023-10-03 ENCOUNTER — Ambulatory Visit (INDEPENDENT_AMBULATORY_CARE_PROVIDER_SITE_OTHER): Admitting: Internal Medicine

## 2023-10-03 ENCOUNTER — Encounter: Payer: Self-pay | Admitting: Internal Medicine

## 2023-10-03 VITALS — BP 125/82 | HR 84 | Ht 77.0 in | Wt 235.4 lb

## 2023-10-03 DIAGNOSIS — F17210 Nicotine dependence, cigarettes, uncomplicated: Secondary | ICD-10-CM

## 2023-10-03 DIAGNOSIS — J449 Chronic obstructive pulmonary disease, unspecified: Secondary | ICD-10-CM | POA: Diagnosis not present

## 2023-10-03 MED ORDER — STIOLTO RESPIMAT 2.5-2.5 MCG/ACT IN AERS: 2.0000 | INHALATION_SPRAY | Freq: Every day | RESPIRATORY_TRACT | 11 refills | Status: AC

## 2023-10-03 MED ORDER — SPIRIVA RESPIMAT 1.25 MCG/ACT IN AERS: 2.0000 | INHALATION_SPRAY | Freq: Every day | RESPIRATORY_TRACT | Status: DC

## 2023-10-03 NOTE — Patient Instructions (Addendum)
 Spiriva  4 puffs each am   When it's used up then start Stiolto 2 puffs each am - think of it like high octane fuel   Use your albuterol  as a rescue medication to be used if you can't catch your breath by resting, slowing your pace,  or doing a relaxed purse lip breathing pattern.  - The less you use it, the better it will work when you need it. - Ok to use up to 2 puffs  every 4 hours if you must but call for  appointment if use goes up over your usual need - Don't leave home without it !!  (think of it like a spare tire or starter fluid for your car)    Also  Ok to try albuterol  15 min before an activity (on alternating days)  that you know would usually make you short of breath and see if it makes any difference and if makes none then don't take albuterol  after activity unless you can't catch your breath as this means it's the resting that helps, not the albuterol .   My office will be contacting you by phone for referral to lung cancer screening   [phone]) - if you don't hear back from my office within one week,  please call us  back or notify us  thru MyChart and we'll address it right away.    The key is to stop smoking completely before smoking completely stops you!  Please remember to go to the lab department   for your tests - we will call you with the results when they are available.      Please schedule a follow up visit in 3 months but call sooner if needed  with all respiratory medications ignacio

## 2023-10-04 NOTE — Assessment & Plan Note (Addendum)
 Active smoker - onset of doe x his 73's  - PFT's  05/28/23  FEV1 3.36 (69 % ) ratio 0.67  p 2 % improvement from saba p ? prior to study with DLCO  25.5 (71%)   and FV curve atypical:  straightened but not flattened   - 10/03/2023  After extensive coaching inhaler device,  effectiveness =    80% with SMI (short ti) >>> try spiriva  sample then fill rx for stiolto  - 10/03/2023   Walked on RA  x  2  lap(s) =  approx 300  ft  @ mod pace, stopped due to sob  with lowest 02 sats 99%   - Allergy screen 10/03/2023 >  Eos 0.   alpha one AT phenotype pending   Pt is Group B in terms of symptom/risk and laba/lama therefore appropriate rx at this point >>>  stiolto best choice here with more approp use of saba:  Re SABA :  I spent extra time with pt today reviewing appropriate use of albuterol  for prn use on exertion with the following points: 1) saba is for relief of sob that does not improve by walking a slower pace or resting but rather if the pt does not improve after trying this first. 2) If the pt is convinced, as many are, that saba helps recover from activity faster then it's easy to tell if this is the case by re-challenging : ie stop, take the inhaler, then p 5 minutes try the exact same activity (intensity of workload) that just caused the symptoms and see if they are substantially diminished or not after saba 3) if there is an activity that reproducibly causes the symptoms, try the saba 15 min before the activity on alternate days   If in fact the saba really does help, then fine to continue to use it prn but advised may need to look closer at the maintenance regimen (stiolto in this case)  being used to achieve better control of airways disease with exertion.   F/u 3 m with all resp meds in hand using a trust but verify approach to confirm accurate Medication  Reconciliation The principal here is that until we are certain that the  patients are doing what we've asked, it makes no sense to ask them to  do more.

## 2023-10-04 NOTE — Assessment & Plan Note (Signed)
 Counseled re importance of smoking cessation but did not meet time criteria for separate billing    Low-dose CT lung cancer screening is recommended for patients who are 59-52 years of age with a 20+ pack-year history of smoking and who are currently smoking or quit <=15 years ago. No coughing up blood  No unintentional weight loss of > 15 pounds in the last 6 months - pt is eligible for scanning yearly until 15 y p quits > referred   Each maintenance medication was reviewed in detail including emphasizing most importantly the difference between maintenance and prns and under what circumstances the prns are to be triggered using an action plan format where appropriate.  Total time for H and P, chart review, counseling, reviewing hfa/ smi  device(s) , directly observing portions of ambulatory 02 saturation study/ and generating customized AVS unique to this office visit / same day charting = 48 min new pt eval

## 2023-10-05 LAB — CBC WITH DIFFERENTIAL/PLATELET
Basophils Absolute: 0.1 x10E3/uL (ref 0.0–0.2)
Basos: 1 %
EOS (ABSOLUTE): 0.5 x10E3/uL — ABNORMAL HIGH (ref 0.0–0.4)
Eos: 6 %
Hematocrit: 51.9 % — ABNORMAL HIGH (ref 37.5–51.0)
Hemoglobin: 16.5 g/dL (ref 13.0–17.7)
Immature Grans (Abs): 0 x10E3/uL (ref 0.0–0.1)
Immature Granulocytes: 0 %
Lymphocytes Absolute: 3.6 x10E3/uL — ABNORMAL HIGH (ref 0.7–3.1)
Lymphs: 40 %
MCH: 27.6 pg (ref 26.6–33.0)
MCHC: 31.8 g/dL (ref 31.5–35.7)
MCV: 87 fL (ref 79–97)
Monocytes Absolute: 0.6 x10E3/uL (ref 0.1–0.9)
Monocytes: 6 %
Neutrophils Absolute: 4.2 x10E3/uL (ref 1.4–7.0)
Neutrophils: 47 %
Platelets: 299 x10E3/uL (ref 150–450)
RBC: 5.97 x10E6/uL — ABNORMAL HIGH (ref 4.14–5.80)
RDW: 14.4 % (ref 11.6–15.4)
WBC: 8.9 x10E3/uL (ref 3.4–10.8)

## 2023-10-05 LAB — ALPHA-1-ANTITRYPSIN PHENOTYP: A-1 Antitrypsin: 189 mg/dL — ABNORMAL HIGH (ref 101–187)

## 2023-10-16 DIAGNOSIS — E1165 Type 2 diabetes mellitus with hyperglycemia: Secondary | ICD-10-CM | POA: Diagnosis not present

## 2023-10-19 ENCOUNTER — Emergency Department (HOSPITAL_COMMUNITY)

## 2023-10-19 ENCOUNTER — Encounter (HOSPITAL_COMMUNITY): Payer: Self-pay

## 2023-10-19 ENCOUNTER — Observation Stay (HOSPITAL_COMMUNITY)
Admission: EM | Admit: 2023-10-19 | Discharge: 2023-10-21 | Disposition: A | Discharge status: Home / Self Care | Attending: Emergency Medicine | Admitting: Emergency Medicine

## 2023-10-19 ENCOUNTER — Other Ambulatory Visit: Payer: Self-pay

## 2023-10-19 DIAGNOSIS — E1122 Type 2 diabetes mellitus with diabetic chronic kidney disease: Secondary | ICD-10-CM | POA: Diagnosis not present

## 2023-10-19 DIAGNOSIS — N189 Chronic kidney disease, unspecified: Secondary | ICD-10-CM | POA: Insufficient documentation

## 2023-10-19 DIAGNOSIS — R4020 Unspecified coma: Principal | ICD-10-CM

## 2023-10-19 DIAGNOSIS — R42 Dizziness and giddiness: Secondary | ICD-10-CM

## 2023-10-19 DIAGNOSIS — J449 Chronic obstructive pulmonary disease, unspecified: Secondary | ICD-10-CM | POA: Diagnosis not present

## 2023-10-19 DIAGNOSIS — I672 Cerebral atherosclerosis: Secondary | ICD-10-CM | POA: Diagnosis not present

## 2023-10-19 DIAGNOSIS — I2510 Atherosclerotic heart disease of native coronary artery without angina pectoris: Secondary | ICD-10-CM | POA: Diagnosis not present

## 2023-10-19 DIAGNOSIS — Z7982 Long term (current) use of aspirin: Secondary | ICD-10-CM | POA: Insufficient documentation

## 2023-10-19 DIAGNOSIS — R55 Syncope and collapse: Principal | ICD-10-CM | POA: Insufficient documentation

## 2023-10-19 DIAGNOSIS — R531 Weakness: Secondary | ICD-10-CM | POA: Diagnosis not present

## 2023-10-19 DIAGNOSIS — E785 Hyperlipidemia, unspecified: Secondary | ICD-10-CM | POA: Diagnosis not present

## 2023-10-19 DIAGNOSIS — I130 Hypertensive heart and chronic kidney disease with heart failure and stage 1 through stage 4 chronic kidney disease, or unspecified chronic kidney disease: Secondary | ICD-10-CM | POA: Diagnosis not present

## 2023-10-19 DIAGNOSIS — E782 Mixed hyperlipidemia: Secondary | ICD-10-CM | POA: Diagnosis not present

## 2023-10-19 DIAGNOSIS — R519 Headache, unspecified: Secondary | ICD-10-CM | POA: Diagnosis not present

## 2023-10-19 DIAGNOSIS — E039 Hypothyroidism, unspecified: Secondary | ICD-10-CM | POA: Diagnosis not present

## 2023-10-19 DIAGNOSIS — R0602 Shortness of breath: Secondary | ICD-10-CM | POA: Diagnosis not present

## 2023-10-19 DIAGNOSIS — F17210 Nicotine dependence, cigarettes, uncomplicated: Secondary | ICD-10-CM | POA: Insufficient documentation

## 2023-10-19 DIAGNOSIS — J439 Emphysema, unspecified: Secondary | ICD-10-CM | POA: Diagnosis not present

## 2023-10-19 DIAGNOSIS — I639 Cerebral infarction, unspecified: Secondary | ICD-10-CM | POA: Diagnosis not present

## 2023-10-19 DIAGNOSIS — Z79899 Other long term (current) drug therapy: Secondary | ICD-10-CM | POA: Insufficient documentation

## 2023-10-19 DIAGNOSIS — I509 Heart failure, unspecified: Secondary | ICD-10-CM | POA: Insufficient documentation

## 2023-10-19 DIAGNOSIS — F17220 Nicotine dependence, chewing tobacco, uncomplicated: Secondary | ICD-10-CM | POA: Diagnosis not present

## 2023-10-19 DIAGNOSIS — Z8673 Personal history of transient ischemic attack (TIA), and cerebral infarction without residual deficits: Secondary | ICD-10-CM | POA: Insufficient documentation

## 2023-10-19 DIAGNOSIS — I651 Occlusion and stenosis of basilar artery: Secondary | ICD-10-CM | POA: Diagnosis not present

## 2023-10-19 DIAGNOSIS — I700 Atherosclerosis of aorta: Secondary | ICD-10-CM | POA: Diagnosis not present

## 2023-10-19 DIAGNOSIS — I6509 Occlusion and stenosis of unspecified vertebral artery: Secondary | ICD-10-CM | POA: Diagnosis present

## 2023-10-19 DIAGNOSIS — I10 Essential (primary) hypertension: Secondary | ICD-10-CM | POA: Diagnosis present

## 2023-10-19 DIAGNOSIS — E1159 Type 2 diabetes mellitus with other circulatory complications: Secondary | ICD-10-CM | POA: Diagnosis present

## 2023-10-19 DIAGNOSIS — I6523 Occlusion and stenosis of bilateral carotid arteries: Secondary | ICD-10-CM | POA: Diagnosis not present

## 2023-10-19 LAB — URINALYSIS, ROUTINE W REFLEX MICROSCOPIC
Bacteria, UA: NONE SEEN
Bilirubin Urine: NEGATIVE
Glucose, UA: >=500 mg/dL — AB
Ketones, ur: NEGATIVE mg/dL
Leukocytes,Ua: NEGATIVE
Nitrite: NEGATIVE
Protein, ur: NEGATIVE mg/dL
Specific Gravity, Urine: 1.022 (ref 1.005–1.030)
pH: 5 (ref 5.0–8.0)

## 2023-10-19 LAB — CBC WITH DIFFERENTIAL/PLATELET
Abs Immature Granulocytes: 0.02 K/uL (ref 0.00–0.07)
Basophils Absolute: 0 K/uL (ref 0.0–0.1)
Basophils Relative: 0 %
Eosinophils Absolute: 0.3 K/uL (ref 0.0–0.5)
Eosinophils Relative: 5 %
HCT: 50.2 % (ref 39.0–52.0)
Hemoglobin: 16.8 g/dL (ref 13.0–17.0)
Immature Granulocytes: 0 %
Lymphocytes Relative: 40 %
Lymphs Abs: 2.6 K/uL (ref 0.7–4.0)
MCH: 28.4 pg (ref 26.0–34.0)
MCHC: 33.5 g/dL (ref 30.0–36.0)
MCV: 84.8 fL (ref 80.0–100.0)
Monocytes Absolute: 0.4 K/uL (ref 0.1–1.0)
Monocytes Relative: 7 %
Neutro Abs: 3.2 K/uL (ref 1.7–7.7)
Neutrophils Relative %: 48 %
Platelets: 289 K/uL (ref 150–400)
RBC: 5.92 MIL/uL — ABNORMAL HIGH (ref 4.22–5.81)
RDW: 15.8 % — ABNORMAL HIGH (ref 11.5–15.5)
WBC: 6.6 K/uL (ref 4.0–10.5)
nRBC: 0 % (ref 0.0–0.2)

## 2023-10-19 LAB — PROTIME-INR
INR: 1 (ref 0.8–1.2)
Prothrombin Time: 14 s (ref 11.4–15.2)

## 2023-10-19 LAB — COMPREHENSIVE METABOLIC PANEL WITH GFR
ALT: 13 U/L (ref 0–44)
AST: 15 U/L (ref 15–41)
Albumin: 3.8 g/dL (ref 3.5–5.0)
Alkaline Phosphatase: 77 U/L (ref 38–126)
Anion gap: 13 (ref 5–15)
BUN: 16 mg/dL (ref 6–20)
CO2: 23 mmol/L (ref 22–32)
Calcium: 9.6 mg/dL (ref 8.9–10.3)
Chloride: 101 mmol/L (ref 98–111)
Creatinine, Ser: 1.1 mg/dL (ref 0.61–1.24)
GFR, Estimated: >60 mL/min (ref >60)
Glucose, Bld: 123 mg/dL — ABNORMAL HIGH (ref 70–99)
Potassium: 3.6 mmol/L (ref 3.5–5.1)
Sodium: 137 mmol/L (ref 135–145)
Total Bilirubin: 0.9 mg/dL (ref 0.0–1.2)
Total Protein: 7.4 g/dL (ref 6.5–8.1)

## 2023-10-19 LAB — RAPID URINE DRUG SCREEN, HOSP PERFORMED
Amphetamines: NOT DETECTED
Barbiturates: NOT DETECTED
Benzodiazepines: NOT DETECTED
Cocaine: NOT DETECTED
Opiates: POSITIVE — AB
Tetrahydrocannabinol: NOT DETECTED

## 2023-10-19 LAB — BLOOD GAS, VENOUS
Acid-Base Excess: 3.2 mmol/L — ABNORMAL HIGH (ref 0.0–2.0)
Bicarbonate: 28.5 mmol/L — ABNORMAL HIGH (ref 20.0–28.0)
Drawn by: 45381
O2 Saturation: 60.5 %
Patient temperature: 36.5
pCO2, Ven: 44 mmHg (ref 44–60)
pH, Ven: 7.42 (ref 7.25–7.43)
pO2, Ven: <31 mmHg — CL (ref 32–45)

## 2023-10-19 LAB — LACTIC ACID, PLASMA: Lactic Acid, Venous: 1.2 mmol/L (ref 0.5–1.9)

## 2023-10-19 LAB — CBG MONITORING, ED: Glucose-Capillary: 145 mg/dL — ABNORMAL HIGH (ref 70–99)

## 2023-10-19 LAB — ETHANOL: Alcohol, Ethyl (B): <15 mg/dL (ref <15)

## 2023-10-19 LAB — TROPONIN I (HIGH SENSITIVITY)
Troponin I (High Sensitivity): 4 ng/L (ref <18)
Troponin I (High Sensitivity): 4 ng/L (ref <18)

## 2023-10-19 LAB — D-DIMER, QUANTITATIVE: D-Dimer, Quant: 0.41 ug{FEU}/mL (ref 0.00–0.50)

## 2023-10-19 MED ORDER — LORAZEPAM 2 MG/ML IJ SOLN
0.5000 mg | Freq: Once | INTRAMUSCULAR | Status: AC
  Administered 2023-10-19: 0.5 mg via INTRAVENOUS
  Filled 2023-10-19: qty 1

## 2023-10-19 MED ORDER — IPRATROPIUM-ALBUTEROL 0.5-2.5 (3) MG/3ML IN SOLN
3.0000 mL | Freq: Once | RESPIRATORY_TRACT | Status: AC
  Administered 2023-10-19: 3 mL via RESPIRATORY_TRACT
  Filled 2023-10-19: qty 3

## 2023-10-19 MED ORDER — DIPHENHYDRAMINE HCL 50 MG/ML IJ SOLN
25.0000 mg | Freq: Once | INTRAMUSCULAR | Status: AC
  Administered 2023-10-19: 25 mg via INTRAVENOUS
  Filled 2023-10-19 (×2): qty 1

## 2023-10-19 MED ORDER — IOHEXOL 350 MG/ML SOLN
60.0000 mL | Freq: Once | INTRAVENOUS | Status: AC | PRN
  Administered 2023-10-19: 60 mL via INTRAVENOUS

## 2023-10-19 MED ORDER — METOCLOPRAMIDE HCL 5 MG/ML IJ SOLN
10.0000 mg | Freq: Once | INTRAMUSCULAR | Status: AC
  Administered 2023-10-19: 10 mg via INTRAVENOUS
  Filled 2023-10-19 (×2): qty 2

## 2023-10-19 MED ORDER — ONDANSETRON HCL 4 MG/2ML IJ SOLN: 4.0000 mg | Freq: Once | INTRAMUSCULAR | Status: DC

## 2023-10-19 MED ORDER — MAGNESIUM SULFATE 2 GM/50ML IV SOLN
2.0000 g | Freq: Once | INTRAVENOUS | Status: DC
  Filled 2023-10-19: qty 50

## 2023-10-19 MED ORDER — LACTATED RINGERS IV BOLUS
1000.0000 mL | Freq: Once | INTRAVENOUS | Status: AC
  Administered 2023-10-19: 1000 mL via INTRAVENOUS

## 2023-10-19 MED ORDER — IOHEXOL 350 MG/ML SOLN
75.0000 mL | Freq: Once | INTRAVENOUS | Status: AC | PRN
  Administered 2023-10-19: 60 mL via INTRAVENOUS

## 2023-10-19 NOTE — ED Provider Notes (Signed)
 [place] EMERGENCY DEPARTMENT AT [facility] Provider Note   CSN: [id number] Arrival date & time: 10/19/23  1859     History  Chief Complaint  Patient presents with   Dizziness    Devin [name] is a 53 y.o. male with PMH as listed below who presents with dizziness, loss of consciousness.  History provided by sister at bedside.  Patient was cooking dinner, feeling in his normal state of health and all of a sudden began to feel extremely dizzy and weak and short of breath 2 hours prior to arrival.  In triage patient was noted to lose consciousness immediately upon arrival and was moved straight back to a treatment area. Sister states that patient has h/o CVA/TIA with symptoms very similar to this.  He reports severe headache and shortness of breath with dizziness.  Patient cannot provide any additional history at this time. Level 5 caveat.   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 Also w/ known carotid stenosis. Neurology note from 09/12/23 notes worsening dizzy spells and neurology recommending IR consult to reconsider stent placement. Also recommended MRI for r/o additional CVA, which patient had completed on 09/24/23 which showed old unchanged stroke in L superior cerebellum.   Past Medical History:  Diagnosis Date   Asthma    Chest pain    CHF (congestive heart failure) (HCC)    CKD (chronic kidney disease) 11/13/2017   Coronary artery disease    a. s/p DES x2 to LAD and DES to OM in 11/2017   Depression    Dyspnea    GERD (gastroesophageal reflux disease)    Headache    History of kidney stones    History of rhabdomyolysis    Hyperlipidemia    Hypertension    Hypothyroidism    Ischemic cardiomyopathy    a. 11/2017: echo showing EF of 35-40%, diffuse HK, and Grade 2 DD   Kidney calculus 2014   Pericardial effusion    Small,  by dobutamine echocardiogram, 04/2006   PONV (postoperative nausea and vomiting)    Sleep apnea    does not wear cpap   Stroke [facility])    Tobacco abuse    Type 2 diabetes mellitus without complications (HCC) 10/07/2013   Vitamin D  deficiency        Home Medications Prior to Admission medications   Medication Sig Start Date End Date Taking? Authorizing Provider  aspirin  81 MG EC tablet Take 1 tablet (81 mg total) by mouth daily. With food 12/05/17   Pearlean Manus, MD  clopidogrel  (PLAVIX ) 75 MG tablet Take 1 tablet (75 mg total) by mouth daily. 10/15/19   Vicci Afton LITTIE, MD  Continuous Blood Gluc Receiver (FREESTYLE LIBRE 2 READER) DEVI USE AS DIRECTED 09/02/21   Nida, Gebreselassie W, MD  Continuous Blood Gluc Sensor (FREESTYLE LIBRE 2 SENSOR) MISC APPLY 1 PATCH TO SKIN ONCE EVERY 14 DAYS 03/27/22   Lenis Ethelle ORN, MD  empagliflozin  (JARDIANCE ) 10 MG TABS tablet TAKE 1 TABLET BY MOUTH DAILY BEFORE BREAKFAST 05/28/23   Nida, Gebreselassie W, MD  EUTHYROX  200 MCG tablet Take 200 mcg by mouth at bedtime. 05/13/20   [provider]  Evolocumab  (REPATHA  SURECLICK) 140 MG/ML SOAJ Inject 1 pen into the skin every 14 (fourteen) days. 12/24/19   Alvan Dorn FALCON, MD  ezetimibe  (ZETIA ) 10 MG tablet Take 1 tablet (10 mg total) by  mouth daily. 10/15/19   Johnson, Clanford L, MD  famotidine  (PEPCID ) 40 MG tablet Take 40 mg by mouth daily. 11/23/20   [provider]  fenofibrate  (TRICOR ) 145 MG tablet Take 1 tablet (145 mg total) by mouth daily. 10/15/19   Johnson, Clanford L, MD  fluticasone  (FLONASE ) 50 MCG/ACT nasal spray Place 2 sprays into both nostrils 2 (two) times daily. 06/24/20   [provider]  furosemide  (LASIX ) 20 MG tablet Take 1 tablet (20 mg total) by mouth daily as needed. 10/19/21   Alvan Dorn FALCON, MD  gabapentin  (NEURONTIN ) 300 MG capsule One po q AM, one po q PM and 2 po qHS Patient taking differently: Take 300 mg by mouth 2 (two) times daily. 12/23/19    Sater, Charlie LABOR, MD  glucose blood (ACCU-CHEK GUIDE) test strip Test glucose 2 times a day 08/10/20   Nida, Gebreselassie W, MD  icosapent  Ethyl (VASCEPA ) 1 g capsule Take 2 capsules by mouth twice daily 02/06/22   Alvan Dorn FALCON, MD  Insulin  Pen Needle (B-D ULTRAFINE III SHORT PEN) 31G X 8 MM MISC Use as directed to inject insulin  2 times daily. 05/02/21   Nida, Gebreselassie W, MD  LANTUS  SOLOSTAR 100 UNIT/ML Solostar Pen Inject 60 Units into the skin daily. 01/17/23   [provider]  levothyroxine  (SYNTHROID ) 88 MCG tablet Take 88 mcg by mouth daily. 01/25/21   [provider]  lipase/protease/amylase (CREON) 36000 UNITS CPEP capsule Take 36,000 Units by mouth. Samples given - Take 3 with meals    [provider]  methocarbamol  (ROBAXIN ) 500 MG tablet Take 500 mg by mouth 3 (three) times daily as needed for muscle spasms. 02/19/23   [provider]  nitroGLYCERIN  (NITROSTAT ) 0.4 MG SL tablet Place 1 tablet (0.4 mg total) under the tongue every 5 (five) minutes as needed for chest pain. 02/14/21   Alvan Dorn FALCON, MD  NOVOLOG  FLEXPEN 100 UNIT/ML FlexPen Inject into the skin 3 (three) times daily with meals. 20 units with meals 01/17/23   [provider]  pantoprazole  (PROTONIX ) 40 MG tablet TAKE 1 TABLET BY MOUTH DAILY 08/28/23   Ahmed, Deatrice FALCON, MD  sitaGLIPtin  (JANUVIA ) 50 MG tablet Take 1 tablet (50 mg total) by mouth daily. 06/15/21   Nida, Gebreselassie W, MD  Tiotropium Bromide Monohydrate  (SPIRIVA  RESPIMAT) 1.25 MCG/ACT AERS Inhale 2 puffs into the lungs daily. 10/03/23   Wert, Michael B, MD  Tiotropium Bromide-Olodaterol (STIOLTO RESPIMAT ) 2.5-2.5 MCG/ACT AERS Inhale 2 puffs into the lungs daily. 10/03/23   [name], MD  VENTOLIN  HFA 108 (90 Base) MCG/ACT inhaler Inhale into the lungs. 11/23/20   [provider]      Allergies    Crestor [rosuvastatin], Trulicity [dulaglutide], Bee venom, and Morphine     Review of Systems    Review of Systems A 10 point review of systems was performed and is negative unless otherwise reported in HPI.  Physical Exam Updated Vital Signs BP 106/80   Pulse (!) 108   Ht 6' 5 (1.956 m)   Wt 107 kg   SpO2 97%   BMI 27.97 kg/m  Physical Exam General: Unwell appearing male in mild distress HEENT: PERRLA midrange, EOMI, Sclera anicteric, MMM, trachea midline. No nystagmus.  Cardiology: RRR, no murmurs/rubs/gallops. BL radial and DP pulses equal bilaterally.  Resp: Normal respiratory rate and effort. CTAB, no wheezes, rhonchi, crackles.  Abd: Soft, non-tender, non-distended. No rebound tenderness or guarding.  GU: Deferred. MSK: No peripheral edema or signs of  trauma. Extremities without deformity or TTP. No cyanosis or clubbing. Skin: warm, dry. No rashes or lesions. Back: No CVA tenderness Neuro: Somnolent, difficult to arouse, intermittently unresponsive to every approximately 5 minutes here in the emergency department, CNs II-XII grossly intact. MAEs. Sensation grossly intact.  Slurred speech.  ED Results / Procedures / Treatments   Labs (all labs ordered are listed, but only abnormal results are displayed) Labs Reviewed  CBC WITH DIFFERENTIAL/PLATELET - Abnormal; Notable for the following components:      Result Value   RBC 5.92 (*)    RDW 15.8 (*)    All other components within normal limits  COMPREHENSIVE METABOLIC PANEL WITH GFR - Abnormal; Notable for the following components:   Glucose, Bld 123 (*)    All other components within normal limits  BLOOD GAS, VENOUS - Abnormal; Notable for the following components:   pO2, Ven <31 (*)    Bicarbonate 28.5 (*)    Acid-Base Excess 3.2 (*)    All other components within normal limits  URINALYSIS, ROUTINE W REFLEX MICROSCOPIC - Abnormal; Notable for the following components:   Color, Urine STRAW (*)    Glucose, UA >=500 (*)    Hgb urine dipstick SMALL (*)    All other components within normal limits  RAPID URINE DRUG  SCREEN, HOSP PERFORMED - Abnormal; Notable for the following components:   Opiates POSITIVE (*)    All other components within normal limits  CBG MONITORING, ED - Abnormal; Notable for the following components:   Glucose-Capillary 145 (*)    All other components within normal limits  PROTIME-INR  ETHANOL  LACTIC ACID, PLASMA  D-DIMER, QUANTITATIVE  HIV ANTIBODY (ROUTINE TESTING W REFLEX)  CBC  CBC  TROPONIN I (HIGH SENSITIVITY)  TROPONIN I (HIGH SENSITIVITY)    EKG EKG Interpretation Date/Time:  Friday October 19 2023 19:23:12 EDT Ventricular Rate:  93 PR Interval:  151 QRS Duration:  108 QT Interval:  354 QTC Calculation: 441 R Axis:   112  Text Interpretation: Sinus rhythm Right axis deviation Confirmed by Devin Gills [id number] on 10/19/2023 8:22:56 PM  Radiology CTH: No acute intracranial abnormality noted. Chronic changes as previously described.  CTA H&N: 1. Vascular stent extending from the left V4 segment into the distal basilar artery. Persistent focal filling defect within the distal left V4 segment just distal to the proximal landing zone of the stent, similar to prior, and suspicious for chronic thrombus or possibly a small chronic dissection. Resultant severe stenosis at this level. Overall, appearance is relatively similar as compared to prior CTA from 03/08/2023. 2. Moderate mixed plaque about the left carotid bulb with associated stenosis of up to 70% by NASCET criteria. 3. Atheromatous change about the origins of the vertebral arteries with associated severe left and moderate right ostial stenoses. 4.  Aortic Atherosclerosis (ICD10-I70.0).  CT PE: No evidence of pulmonary emboli. No acute abnormality is seen.  Procedures .Critical Care  Performed by: Devin Gills SAILOR, MD Authorized by: Devin Gills SAILOR, MD   Critical care provider statement:    Critical care time (minutes):  35   Critical care was necessary to treat or prevent imminent or  life-threatening deterioration of the following conditions:  CNS failure or compromise   Critical care was time spent personally by me on the following activities:  Development of treatment plan with patient or surrogate, discussions with consultants, evaluation of patient's response to treatment, examination of patient, ordering and review of laboratory studies, ordering and review of  radiographic studies, ordering and performing treatments and interventions, pulse oximetry, re-evaluation of patient's condition, review of old charts and obtaining history from patient or surrogate     Medications Ordered in ED Medications  lactated ringers  bolus 1,000 mL (0 mLs Intravenous Stopped 10/19/23 2354)  ipratropium-albuterol  (DUONEB) 0.5-2.5 (3) MG/3ML nebulizer solution 3 mL (3 mLs Nebulization Given 10/19/23 2025)  iohexol  (OMNIPAQUE ) 350 MG/ML injection 75 mL (60 mLs Intravenous Contrast Given 10/19/23 2000)  iohexol  (OMNIPAQUE ) 350 MG/ML injection 60 mL (60 mLs Intravenous Contrast Given 10/19/23 2000)  LORazepam  (ATIVAN ) injection 0.5 mg (0.5 mg Intravenous Given 10/19/23 2030)  metoCLOPramide  (REGLAN ) injection 10 mg (10 mg Intravenous Given 10/19/23 2354)  diphenhydrAMINE  (BENADRYL ) injection 25 mg (25 mg Intravenous Given 10/19/23 2354)    ED Course/ Medical Decision Making/ A&P                          Medical Decision Making Amount and/or Complexity of Data Reviewed Labs: ordered. Decision-making details documented in ED Course. Radiology: ordered. Decision-making details documented in ED Course.  Risk OTC drugs. Prescription drug management. Decision regarding hospitalization.    This patient presents to the ED for concern of loss of consciousness, shortness of breath, dizziness, headache, this involves an extensive number of treatment options, and is a complaint that carries with it a high risk of complications and morbidity.  I considered the following differential and admission for this  acute, potentially life threatening condition.   MDM:    On arrival to the emergency department patient was unconscious in triage.  He is moved immediately back to a treatment area and is somnolent, difficult to arouse, reporting that he feels very short of breath.  He does not have any nystagmus reports feeling extremely dizzy as well.  He is put into the reverse Trendelenburg position.  He is not hypotensive, not hypoxic.  He is tachypneic and I verbally coached him to slow his breathing but this is not successful.  He reports that he was tingling in both of his hands and I had concern for hyperventilation and so was trying to coach him.  Still he repeatedly continued to become unresponsive every approximately 5 minutes.  Family at bedside reported history of stroke similar in presentation and he also reports severe headache.  CT head negative for ICH.  CTA does demonstrate vascular stent in vertebrobasilar artery with filling defect suspicious for chronic thrombus or small chronic dissection and severe stenosis at that level.  This finding is still similar to prior study on 2024.  With dizziness loss of consciousness and reported short of breath also considered CT PE which was negative.  Gave DuoNeb as well though no wheezing for possible obstructive process.  Also gave Ativan  0.5 mg for any component of anxiety.  Seemingly as quickly as the symptoms started prior to arrival they resolved.  When I reevaluated the patient in the room he reports feeling well just fatigued and still with mild headache.  Given medications for headache.  EKG without any signs of ischemia troponin negative, low concern for ACS.  UA negative for UTI.  No electrolyte derangements, hypo-/hyperglycemia, or anemia.  High suspicion for TIA or symptoms due to his vertebrobasilar thrombus.  Will discuss with neurology.  Clinical Course as of 10/20/23 0042  Fri Oct 19, 2023  2016 CT Head Wo Contrast No acute intracranial abnormality  noted. Chronic changes as previously described.   [HN]  2022 CT Angio Chest PE  W and/or Wo Contrast No evidence of pulmonary emboli.  No acute abnormality is seen.   [HN]  2143 CT ANGIO HEAD NECK W WO CM 1. Vascular stent extending from the left V4 segment into the distal basilar artery. Persistent focal filling defect within the distal left V4 segment just distal to the proximal landing zone of the stent, similar to prior, and suspicious for chronic thrombus or possibly a small chronic dissection. Resultant severe stenosis at this level. Overall, appearance is relatively similar as compared to prior CTA from 03/08/2023. 2. Moderate mixed plaque about the left carotid bulb with associated stenosis of up to 70% by NASCET criteria. 3. Atheromatous change about the origins of the vertebral arteries with associated severe left and moderate right ostial stenoses. 4.  Aortic Atherosclerosis (ICD10-I70.0).   [HN]  2239 Urinalysis, Routine w reflex microscopic -Urine, Clean Catch(!) neg [HN]  2240 Troponin I (High Sensitivity): 4 Neg x2 [HN]  2332 D/w hospitalist who would prefer we discuss with neurology first. [HN]  Sat Oct 20, 2023  0009 Teleneurology nurse manager Will is aware of patient, teleneurology going to see [HN]    Clinical Course User Index [HN] Devin Sid SAILOR, MD    Labs: I Ordered, and personally interpreted labs.  The pertinent results include:  those listed above  Imaging Studies ordered: I ordered imaging studies including CTH, CTA H&N, CT PE I independently visualized and interpreted imaging. I agree with the radiologist interpretation  Additional history obtained from chart review, family at bedside.    Cardiac Monitoring: The patient was maintained on a cardiac monitor.  I personally viewed and interpreted the cardiac monitored which showed an underlying rhythm of: NSR  Reevaluation: After the interventions noted above, I reevaluated the patient and  found that they have :resolved  Social Determinants of Health: Lives independently  Disposition:  Patient is signed out to the oncoming ED physician Dr. Midge who is made aware of his history, presentation, exam, workup, and plan. Plan is neurology consult and likely admission.   Co morbidities that complicate the patient evaluation  Past Medical History:  Diagnosis Date   Asthma    Chest pain    CHF (congestive heart failure) (HCC)    CKD (chronic kidney disease) 11/13/2017   Coronary artery disease    a. s/p DES x2 to LAD and DES to OM in 11/2017   Depression    Dyspnea    GERD (gastroesophageal reflux disease)    Headache    History of kidney stones    History of rhabdomyolysis    Hyperlipidemia    Hypertension    Hypothyroidism    Ischemic cardiomyopathy    a. 11/2017: echo showing EF of 35-40%, diffuse HK, and Grade 2 DD   Kidney calculus 2014   Pericardial effusion    Small, by dobutamine echocardiogram, 04/2006   PONV (postoperative nausea and vomiting)    Sleep apnea    does not wear cpap   Stroke (HCC)    Tobacco abuse    Type 2 diabetes mellitus without complications (HCC) 10/07/2013   Vitamin D  deficiency      Medicines No orders of the defined types were placed in this encounter.   I have reviewed the patients home medicines and have made adjustments as needed  Problem List / ED Course: Problem List Items Addressed This Visit       Other   Dizziness   Other Visit Diagnoses       Loss of  consciousness (HCC)    -  Primary     Shortness of breath                       This note was created using dictation software, which may contain spelling or grammatical errors.    Devin Sid SAILOR, MD 10/26/23 [phone]

## 2023-10-19 NOTE — ED Notes (Signed)
 Stat tele neuro called at this time per Neuro Hospitalist

## 2023-10-19 NOTE — ED Notes (Signed)
 Pt sitting in bed with family at bedside. A&Ox4. Stated, my head still hurts really bad. Pt denies dizziness, nausea, or needing to vomit.

## 2023-10-19 NOTE — ED Provider Notes (Incomplete)
  EMERGENCY DEPARTMENT AT [facility] Provider Note   CSN: [id number] Arrival date & time: 10/19/23  1859     History {Add pertinent medical, surgical, social history, OB history to HPI:1} Chief Complaint  Patient presents with  . Dizziness    Devin [name] is a 53 y.o. male with PMH as listed below who presents with dizziness, loss of consciousness.  History provided by sister at bedside.  Patient was cooking dinner, feeling in his normal state of health and all of a sudden began to feel extremely dizzy and weak and short of breath 2 hours prior to arrival.  In triage patient was noted to lose consciousness immediately upon arrival and was moving EXTR straight back to a treatment area. 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 Also w/ known carotid stenosis. Neurology note from 09/12/23 notes worsening dizzy spells and neurology recommending IR consult to reconsider stent placement. Also recommended MRI for r/o additional CVA, which patient had completed on 09/24/23 which showed old unchanged stroke in L superior cerebellum.   Past Medical History:  Diagnosis Date  . Asthma   . Chest pain   . CHF (congestive heart failure) (HCC)   . CKD (chronic kidney disease) 11/13/2017  . Coronary artery disease    a. s/p DES x2 to LAD and DES to OM in 11/2017  . Depression   . Dyspnea   . GERD (gastroesophageal reflux disease)   . Headache   . History of kidney stones   . History of rhabdomyolysis   . Hyperlipidemia   . Hypertension   . Hypothyroidism   . Ischemic cardiomyopathy    a. 11/2017: echo showing EF of 35-40%, diffuse HK, and Grade 2 DD  . Kidney calculus 2014  . Pericardial effusion    Small, by dobutamine echocardiogram, 04/2006  . PONV (postoperative nausea and vomiting)   .  Sleep apnea    does not wear cpap  . Stroke (HCC)   . Tobacco abuse   . Type 2 diabetes mellitus without complications (HCC) 10/07/2013  . Vitamin D  deficiency        Home Medications Prior to Admission medications   Medication Sig Start Date End Date Taking? Authorizing Provider  aspirin  81 MG EC tablet Take 1 tablet (81 mg total) by mouth daily. With food 12/05/17   Pearlean Manus, MD  clopidogrel  (PLAVIX ) 75 MG tablet Take 1 tablet (75 mg total) by mouth daily. 10/15/19   Vicci Afton LITTIE, MD  Continuous Blood Gluc Receiver (FREESTYLE LIBRE 2 READER) DEVI USE AS DIRECTED 09/02/21   Nida, Gebreselassie W, MD  Continuous Blood Gluc Sensor (FREESTYLE LIBRE 2 SENSOR) MISC APPLY 1 PATCH TO SKIN ONCE EVERY 14 DAYS 03/27/22   Lenis Ethelle ORN, MD  empagliflozin  (JARDIANCE ) 10 MG TABS tablet TAKE 1 TABLET BY MOUTH DAILY BEFORE BREAKFAST 05/28/23   Nida, Gebreselassie W, MD  EUTHYROX  200 MCG tablet Take 200 mcg by mouth at bedtime. 05/13/20   [provider]  Evolocumab  (REPATHA  SURECLICK) 140 MG/ML SOAJ Inject 1 pen into the skin every 14 (fourteen) days. 12/24/19   Alvan Dorn FALCON, MD  ezetimibe  (ZETIA ) 10 MG tablet Take 1 tablet (10 mg total) by mouth daily. 10/15/19   Johnson, Clanford L, MD  famotidine  (PEPCID ) 40 MG tablet Take 40 mg by mouth  daily. 11/23/20   [provider]  fenofibrate  (TRICOR ) 145 MG tablet Take 1 tablet (145 mg total) by mouth daily. 10/15/19   Johnson, Clanford L, MD  fluticasone (FLONASE) 50 MCG/ACT nasal spray Place 2 sprays into both nostrils 2 (two) times daily. 06/24/20   [provider]  furosemide  (LASIX ) 20 MG tablet Take 1 tablet (20 mg total) by mouth daily as needed. 10/19/21   Alvan Dorn FALCON, MD  gabapentin  (NEURONTIN ) 300 MG capsule One po q AM, one po q PM and 2 po qHS Patient taking differently: Take 300 mg by mouth 2 (two) times daily. 12/23/19   Sater, Charlie LABOR, MD  glucose blood (ACCU-CHEK GUIDE) test strip Test glucose  2 times a day 08/10/20   Nida, Gebreselassie W, MD  icosapent  Ethyl (VASCEPA ) 1 g capsule Take 2 capsules by mouth twice daily 02/06/22   Alvan Dorn FALCON, MD  Insulin  Pen Needle (B-D ULTRAFINE III SHORT PEN) 31G X 8 MM MISC Use as directed to inject insulin  2 times daily. 05/02/21   Nida, Gebreselassie W, MD  LANTUS  SOLOSTAR 100 UNIT/ML Solostar Pen Inject 60 Units into the skin daily. 01/17/23   [provider]  levothyroxine  (SYNTHROID ) 88 MCG tablet Take 88 mcg by mouth daily. 01/25/21   [provider]  lipase/protease/amylase (CREON) 36000 UNITS CPEP capsule Take 36,000 Units by mouth. Samples given - Take 3 with meals    [provider]  methocarbamol  (ROBAXIN ) 500 MG tablet Take 500 mg by mouth 3 (three) times daily as needed for muscle spasms. 02/19/23   [provider]  nitroGLYCERIN  (NITROSTAT ) 0.4 MG SL tablet Place 1 tablet (0.4 mg total) under the tongue every 5 (five) minutes as needed for chest pain. 02/14/21   Alvan Dorn FALCON, MD  NOVOLOG  FLEXPEN 100 UNIT/ML FlexPen Inject into the skin 3 (three) times daily with meals. 20 units with meals 01/17/23   [provider]  pantoprazole  (PROTONIX ) 40 MG tablet TAKE 1 TABLET BY MOUTH DAILY 08/28/23   Ahmed, Deatrice FALCON, MD  sitaGLIPtin  (JANUVIA ) 50 MG tablet Take 1 tablet (50 mg total) by mouth daily. 06/15/21   Nida, Gebreselassie W, MD  Tiotropium Bromide Monohydrate  (SPIRIVA  RESPIMAT) 1.25 MCG/ACT AERS Inhale 2 puffs into the lungs daily. 10/03/23   Wert, Michael B, MD  Tiotropium Bromide-Olodaterol (STIOLTO RESPIMAT ) 2.5-2.5 MCG/ACT AERS Inhale 2 puffs into the lungs daily. 10/03/23   [name], MD  VENTOLIN  HFA 108 (90 Base) MCG/ACT inhaler Inhale into the lungs. 11/23/20   [provider]      Allergies    Crestor [rosuvastatin], Trulicity [dulaglutide], Bee venom, and Morphine     Review of Systems   Review of Systems A 10 point review of systems was performed and is negative  unless otherwise reported in HPI.  Physical Exam Updated Vital Signs BP 106/80   Pulse (!) 108   Ht 6' 5 (1.956 m)   Wt 107 kg   SpO2 97%   BMI 27.97 kg/m  Physical Exam General: Normal appearing {Desc; male/male:11659}, lying in bed.  HEENT: PERRLA, Sclera anicteric, MMM, trachea midline.  Cardiology: RRR, no murmurs/rubs/gallops. BL radial and DP pulses equal bilaterally.  Resp: Normal respiratory rate and effort. CTAB, no wheezes, rhonchi, crackles.  Abd: Soft, non-tender, non-distended. No rebound tenderness or guarding.  GU: Deferred. MSK: No peripheral edema or signs of trauma. Extremities without deformity or TTP. No cyanosis or clubbing. Skin: warm, dry. No rashes or lesions. Back: No CVA tenderness Neuro:  A&Ox4, CNs II-XII grossly intact. MAEs. Sensation grossly intact.  Psych: Normal mood and affect.   ED Results / Procedures / Treatments   Labs (all labs ordered are listed, but only abnormal results are displayed) Labs Reviewed  CBG MONITORING, ED - Abnormal; Notable for the following components:      Result Value   Glucose-Capillary 145 (*)    All other components within normal limits    EKG None  Radiology No results found.  Procedures Procedures  {Document cardiac monitor, telemetry assessment procedure when appropriate:1}  Medications Ordered in ED Medications - No data to display  ED Course/ Medical Decision Making/ A&P                          Medical Decision Making Amount and/or Complexity of Data Reviewed Labs: ordered. Decision-making details documented in ED Course. Radiology: ordered. Decision-making details documented in ED Course.  Risk Prescription drug management. Decision regarding hospitalization.    This patient presents to the ED for concern of ***, this involves an extensive number of treatment options, and is a complaint that carries with it a high risk of complications and morbidity.  I considered the following  differential and admission for this acute, potentially life threatening condition.   MDM:    ***  Clinical Course as of 10/19/23 2347  Fri Oct 19, 2023  2016 CT Head Wo Contrast No acute intracranial abnormality noted. Chronic changes as previously described.   [HN]  2022 CT Angio Chest PE W and/or Wo Contrast No evidence of pulmonary emboli.  No acute abnormality is seen.   [HN]  2143 CT ANGIO HEAD NECK W WO CM 1. Vascular stent extending from the left V4 segment into the distal basilar artery. Persistent focal filling defect within the distal left V4 segment just distal to the proximal landing zone of the stent, similar to prior, and suspicious for chronic thrombus or possibly a small chronic dissection. Resultant severe stenosis at this level. Overall, appearance is relatively similar as compared to prior CTA from 03/08/2023. 2. Moderate mixed plaque about the left carotid bulb with associated stenosis of up to 70% by NASCET criteria. 3. Atheromatous change about the origins of the vertebral arteries with associated severe left and moderate right ostial stenoses. 4.  Aortic Atherosclerosis (ICD10-I70.0).   [HN]  2239 Urinalysis, Routine w reflex microscopic -Urine, Clean Catch(!) neg [HN]  2240 Troponin I (High Sensitivity): 4 Neg x2 [HN]  2332 D/w hospitalist who would prefer we discuss with neurology first. [HN]    Clinical Course User Index [HN] Franklyn Sid SAILOR, MD    Labs: I Ordered, and personally interpreted labs.  The pertinent results include:  ***  Imaging Studies ordered: I ordered imaging studies including *** I independently visualized and interpreted imaging. I agree with the radiologist interpretation  Additional history obtained from ***.  External records from outside source obtained and reviewed including ***  Cardiac Monitoring: .The patient was maintained on a cardiac monitor.  I personally viewed and interpreted the cardiac monitored which  showed an underlying rhythm of: ***  Reevaluation: After the interventions noted above, I reevaluated the patient and found that they have :{resolved/improved/worsened:23923::improved}  Social Determinants of Health: .***  Disposition:  ***  Co morbidities that complicate the patient evaluation . Past Medical History:  Diagnosis Date  . Asthma   . Chest pain   . CHF (congestive heart failure) (HCC)   . CKD (chronic kidney disease)  11/13/2017  . Coronary artery disease    a. s/p DES x2 to LAD and DES to OM in 11/2017  . Depression   . Dyspnea   . GERD (gastroesophageal reflux disease)   . Headache   . History of kidney stones   . History of rhabdomyolysis   . Hyperlipidemia   . Hypertension   . Hypothyroidism   . Ischemic cardiomyopathy    a. 11/2017: echo showing EF of 35-40%, diffuse HK, and Grade 2 DD  . Kidney calculus 2014  . Pericardial effusion    Small, by dobutamine echocardiogram, 04/2006  . PONV (postoperative nausea and vomiting)   . Sleep apnea    does not wear cpap  . Stroke (HCC)   . Tobacco abuse   . Type 2 diabetes mellitus without complications (HCC) 10/07/2013  . Vitamin D  deficiency      Medicines No orders of the defined types were placed in this encounter.   I have reviewed the patients home medicines and have made adjustments as needed  Problem List / ED Course: Problem List Items Addressed This Visit   None        {Document critical care time when appropriate:1} {Document review of labs and clinical decision tools ie heart score, Chads2Vasc2 etc:1}  {Document your independent review of radiology images, and any outside records:1} {Document your discussion with family members, caretakers, and with consultants:1} {Document social determinants of health affecting pt's care:1} {Document your decision making why or why not admission, treatments were needed:1}  This note was created using dictation software, which may contain spelling  or grammatical errors.

## 2023-10-19 NOTE — ED Triage Notes (Addendum)
 Pt stated that he is feeling dizzy, weak, and SOB as of 2 hours ago. Stated he can't think straight. Pt answering questions and then passed out twice during triage. No response to sternal rubbing

## 2023-10-20 ENCOUNTER — Observation Stay (HOSPITAL_COMMUNITY)

## 2023-10-20 ENCOUNTER — Encounter (HOSPITAL_COMMUNITY): Payer: Self-pay | Admitting: Family Medicine

## 2023-10-20 DIAGNOSIS — Z95828 Presence of other vascular implants and grafts: Secondary | ICD-10-CM

## 2023-10-20 DIAGNOSIS — I6522 Occlusion and stenosis of left carotid artery: Secondary | ICD-10-CM

## 2023-10-20 DIAGNOSIS — E039 Hypothyroidism, unspecified: Secondary | ICD-10-CM | POA: Diagnosis not present

## 2023-10-20 DIAGNOSIS — J449 Chronic obstructive pulmonary disease, unspecified: Secondary | ICD-10-CM | POA: Diagnosis not present

## 2023-10-20 DIAGNOSIS — F17220 Nicotine dependence, chewing tobacco, uncomplicated: Secondary | ICD-10-CM | POA: Diagnosis not present

## 2023-10-20 DIAGNOSIS — E785 Hyperlipidemia, unspecified: Secondary | ICD-10-CM | POA: Diagnosis not present

## 2023-10-20 DIAGNOSIS — F17210 Nicotine dependence, cigarettes, uncomplicated: Secondary | ICD-10-CM | POA: Diagnosis not present

## 2023-10-20 DIAGNOSIS — R42 Dizziness and giddiness: Secondary | ICD-10-CM | POA: Diagnosis present

## 2023-10-20 DIAGNOSIS — E1122 Type 2 diabetes mellitus with diabetic chronic kidney disease: Secondary | ICD-10-CM | POA: Diagnosis not present

## 2023-10-20 DIAGNOSIS — I6523 Occlusion and stenosis of bilateral carotid arteries: Secondary | ICD-10-CM | POA: Diagnosis not present

## 2023-10-20 DIAGNOSIS — I6502 Occlusion and stenosis of left vertebral artery: Secondary | ICD-10-CM | POA: Diagnosis not present

## 2023-10-20 DIAGNOSIS — I651 Occlusion and stenosis of basilar artery: Secondary | ICD-10-CM

## 2023-10-20 DIAGNOSIS — N189 Chronic kidney disease, unspecified: Secondary | ICD-10-CM | POA: Diagnosis not present

## 2023-10-20 DIAGNOSIS — Z79899 Other long term (current) drug therapy: Secondary | ICD-10-CM | POA: Diagnosis not present

## 2023-10-20 DIAGNOSIS — I6509 Occlusion and stenosis of unspecified vertebral artery: Secondary | ICD-10-CM

## 2023-10-20 DIAGNOSIS — I509 Heart failure, unspecified: Secondary | ICD-10-CM | POA: Diagnosis not present

## 2023-10-20 DIAGNOSIS — Z8673 Personal history of transient ischemic attack (TIA), and cerebral infarction without residual deficits: Secondary | ICD-10-CM | POA: Diagnosis not present

## 2023-10-20 DIAGNOSIS — I130 Hypertensive heart and chronic kidney disease with heart failure and stage 1 through stage 4 chronic kidney disease, or unspecified chronic kidney disease: Secondary | ICD-10-CM | POA: Diagnosis not present

## 2023-10-20 DIAGNOSIS — R55 Syncope and collapse: Secondary | ICD-10-CM | POA: Diagnosis not present

## 2023-10-20 DIAGNOSIS — Z7982 Long term (current) use of aspirin: Secondary | ICD-10-CM | POA: Diagnosis not present

## 2023-10-20 DIAGNOSIS — E782 Mixed hyperlipidemia: Secondary | ICD-10-CM | POA: Diagnosis not present

## 2023-10-20 DIAGNOSIS — I2510 Atherosclerotic heart disease of native coronary artery without angina pectoris: Secondary | ICD-10-CM | POA: Diagnosis not present

## 2023-10-20 LAB — CBC
HCT: 48.2 % (ref 39.0–52.0)
Hemoglobin: 15.4 g/dL (ref 13.0–17.0)
MCH: 27.3 pg (ref 26.0–34.0)
MCHC: 32 g/dL (ref 30.0–36.0)
MCV: 85.3 fL (ref 80.0–100.0)
Platelets: 306 K/uL (ref 150–400)
RBC: 5.65 MIL/uL (ref 4.22–5.81)
RDW: 15.5 % (ref 11.5–15.5)
WBC: 6.9 K/uL (ref 4.0–10.5)
nRBC: 0 % (ref 0.0–0.2)

## 2023-10-20 LAB — BASIC METABOLIC PANEL WITH GFR
Anion gap: 10 (ref 5–15)
BUN: 13 mg/dL (ref 6–20)
CO2: 26 mmol/L (ref 22–32)
Calcium: 9.5 mg/dL (ref 8.9–10.3)
Chloride: 103 mmol/L (ref 98–111)
Creatinine, Ser: 1.24 mg/dL (ref 0.61–1.24)
GFR, Estimated: >60 mL/min (ref >60)
Glucose, Bld: 208 mg/dL — ABNORMAL HIGH (ref 70–99)
Potassium: 3.8 mmol/L (ref 3.5–5.1)
Sodium: 139 mmol/L (ref 135–145)

## 2023-10-20 LAB — LIPID PANEL
Cholesterol: 280 mg/dL — ABNORMAL HIGH (ref 0–200)
HDL: 25 mg/dL — ABNORMAL LOW (ref >40)
LDL Cholesterol: 212 mg/dL — ABNORMAL HIGH (ref 0–99)
Total CHOL/HDL Ratio: 11.2 ratio
Triglycerides: 215 mg/dL — ABNORMAL HIGH (ref <150)
VLDL: 43 mg/dL — ABNORMAL HIGH (ref 0–40)

## 2023-10-20 LAB — GLUCOSE, CAPILLARY
Glucose-Capillary: 244 mg/dL — ABNORMAL HIGH (ref 70–99)
Glucose-Capillary: 179 mg/dL — ABNORMAL HIGH (ref 70–99)

## 2023-10-20 LAB — HIV ANTIBODY (ROUTINE TESTING W REFLEX): HIV Screen 4th Generation wRfx: NONREACTIVE

## 2023-10-20 LAB — TSH: TSH: 0.275 u[IU]/mL — ABNORMAL LOW (ref 0.350–4.500)

## 2023-10-20 MED ORDER — ENOXAPARIN SODIUM 40 MG/0.4ML IJ SOSY
40.0000 mg | PREFILLED_SYRINGE | INTRAMUSCULAR | Status: DC
  Administered 2023-10-20: 40 mg via SUBCUTANEOUS
  Filled 2023-10-20: qty 0.4

## 2023-10-20 MED ORDER — ASPIRIN 81 MG PO CHEW
81.0000 mg | CHEWABLE_TABLET | Freq: Once | ORAL | Status: AC
  Administered 2023-10-20: 81 mg via ORAL
  Filled 2023-10-20: qty 1

## 2023-10-20 MED ORDER — ICOSAPENT ETHYL 1 G PO CAPS
2.0000 g | ORAL_CAPSULE | Freq: Two times a day (BID) | ORAL | Status: DC
  Administered 2023-10-20 – 2023-10-21 (×2): 2 g via ORAL
  Filled 2023-10-20 (×2): qty 2

## 2023-10-20 MED ORDER — FLUTICASONE PROPIONATE 50 MCG/ACT NA SUSP
2.0000 | Freq: Two times a day (BID) | NASAL | Status: DC
  Filled 2023-10-20: qty 16

## 2023-10-20 MED ORDER — INSULIN ASPART 100 UNIT/ML IJ SOLN
0.0000 [IU] | Freq: Three times a day (TID) | INTRAMUSCULAR | Status: DC
  Administered 2023-10-20: 7 [IU] via SUBCUTANEOUS
  Administered 2023-10-21: 11 [IU] via SUBCUTANEOUS
  Administered 2023-10-21: 4 [IU] via SUBCUTANEOUS

## 2023-10-20 MED ORDER — FAMOTIDINE 20 MG PO TABS
40.0000 mg | ORAL_TABLET | Freq: Every day | ORAL | Status: DC
  Administered 2023-10-20 – 2023-10-21 (×2): 40 mg via ORAL
  Filled 2023-10-20 (×2): qty 2

## 2023-10-20 MED ORDER — SODIUM CHLORIDE 0.9% FLUSH
3.0000 mL | Freq: Two times a day (BID) | INTRAVENOUS | Status: DC
  Administered 2023-10-20 – 2023-10-21 (×3): 3 mL via INTRAVENOUS

## 2023-10-20 MED ORDER — ACETAMINOPHEN 325 MG PO TABS: 650.0000 mg | ORAL_TABLET | Freq: Four times a day (QID) | ORAL | Status: DC | PRN

## 2023-10-20 MED ORDER — IPRATROPIUM-ALBUTEROL 0.5-2.5 (3) MG/3ML IN SOLN: 3.0000 mL | Freq: Four times a day (QID) | RESPIRATORY_TRACT | Status: DC | PRN

## 2023-10-20 MED ORDER — EZETIMIBE 10 MG PO TABS
10.0000 mg | ORAL_TABLET | Freq: Every day | ORAL | Status: DC
  Administered 2023-10-20 – 2023-10-21 (×2): 10 mg via ORAL
  Filled 2023-10-20 (×2): qty 1

## 2023-10-20 MED ORDER — CLOPIDOGREL BISULFATE 75 MG PO TABS
75.0000 mg | ORAL_TABLET | Freq: Every day | ORAL | Status: DC
  Administered 2023-10-20 – 2023-10-21 (×2): 75 mg via ORAL
  Filled 2023-10-20 (×2): qty 1

## 2023-10-20 MED ORDER — PANTOPRAZOLE SODIUM 40 MG PO TBEC
40.0000 mg | DELAYED_RELEASE_TABLET | Freq: Every day | ORAL | Status: DC
  Administered 2023-10-20 – 2023-10-21 (×2): 40 mg via ORAL
  Filled 2023-10-20 (×2): qty 1

## 2023-10-20 MED ORDER — INSULIN GLARGINE-YFGN 100 UNIT/ML ~~LOC~~ SOLN
20.0000 [IU] | Freq: Every day | SUBCUTANEOUS | Status: DC
  Administered 2023-10-20: 20 [IU] via SUBCUTANEOUS
  Filled 2023-10-20 (×2): qty 0.2

## 2023-10-20 MED ORDER — INSULIN ASPART 100 UNIT/ML IJ SOLN: 0.0000 [IU] | Freq: Every day | INTRAMUSCULAR | Status: DC

## 2023-10-20 MED ORDER — GEMFIBROZIL 600 MG PO TABS
600.0000 mg | ORAL_TABLET | Freq: Two times a day (BID) | ORAL | Status: DC
  Administered 2023-10-20 – 2023-10-21 (×2): 600 mg via ORAL
  Filled 2023-10-20 (×3): qty 1

## 2023-10-20 MED ORDER — CLOPIDOGREL BISULFATE 75 MG PO TABS
300.0000 mg | ORAL_TABLET | Freq: Once | ORAL | Status: AC
  Administered 2023-10-20: 300 mg via ORAL
  Filled 2023-10-20: qty 4

## 2023-10-20 MED ORDER — ASPIRIN 81 MG PO TBEC
81.0000 mg | DELAYED_RELEASE_TABLET | Freq: Every day | ORAL | Status: DC
  Administered 2023-10-20 – 2023-10-21 (×2): 81 mg via ORAL
  Filled 2023-10-20 (×2): qty 1

## 2023-10-20 MED ORDER — GABAPENTIN 300 MG PO CAPS
300.0000 mg | ORAL_CAPSULE | Freq: Every morning | ORAL | Status: DC
  Administered 2023-10-21: 300 mg via ORAL
  Filled 2023-10-20: qty 1

## 2023-10-20 MED ORDER — ACETAMINOPHEN 650 MG RE SUPP
650.0000 mg | Freq: Four times a day (QID) | RECTAL | Status: DC | PRN
Start: 2023-10-20 — End: 2023-10-21

## 2023-10-20 NOTE — ED Provider Notes (Signed)
 Discussed with Dr. Bryn for admission patient will likely need transfer to [facility] Midge Nancyann, MD 10/20/23 [phone]

## 2023-10-20 NOTE — Evaluation (Signed)
 Physical Therapy Evaluation and Discharge Patient Details Name: Devin [name] MRN: [id number] DOB: 27-May-1970 Today's Date: 10/20/2023  History of Present Illness  53 y.o. male who presented to the ED 8/8 with vague complaints of can't think straight, subsequently lost consciousness in triage. PMH: vertebral artery stenting, CAD s/p stents 2019, HTN, T2DM, HLD, tobacco use, hypothyroidism.    Clinical Impression  Pt admitted with above diagnosis. Independent at baseline with rare use of SPC for balance from a prior stroke that affected his left side. Head impulse test normal, without lag. No appreciable nystagmus. Test of skew normal. States symptoms are spontaneous and not worsened by head or eye movement. BERG balance test indicates low fall risk 52/56 today. Tolerated dynamic gait challenges without loss of balance, but had a brief mild dizziness <3 seconds with quick turns in hallway. Extensive education for safety and symptom awareness. May benefit from OPPT neuro rehab follow-up if symptoms persist. No further acute PT needs identified. All education has been completed and the patient has no further questions.  PT is signing off. Thank you for this referral.          If plan is discharge home, recommend the following: Assistance with cooking/housework;Assist for transportation   Can travel by private vehicle        Equipment Recommendations None recommended by PT  Recommendations for Other Services       Functional Status Assessment Patient has not had a recent decline in their functional status     Precautions / Restrictions Precautions Precautions: Fall Restrictions Weight Bearing Restrictions Per Provider Order: No      Mobility  Bed Mobility Overal bed mobility: Independent                  Transfers Overall transfer level: Modified independent                 General transfer comment: Extra time but no physical assist needed     Ambulation/Gait Ambulation/Gait assistance: Modified independent (Device/Increase time) Gait Distance (Feet): 115 Feet Assistive device: None Gait Pattern/deviations: Step-through pattern, Drifts right/left Gait velocity: dec Gait velocity interpretation: 1.31 - 2.62 ft/sec, indicative of limited community ambulator (self selected gait speed)   General Gait Details: Minimal instability with dynamic balance challenges. Safely able to march forwards, step backwards, turn quickly, and perform head turns and nods without overt LOB. Good pace and able to walk at variable speeds on command. Pt reported one episode of dizziness <3 seconds, with a quick 180 deg turn. No LOB, good awareness. No assistive device utilized.  Stairs            Wheelchair Mobility     Tilt Bed    Modified Rankin (Stroke Patients Only)       Balance Overall balance assessment: Modified Independent                               Standardized Balance Assessment Standardized Balance Assessment : Berg Balance Test Berg Balance Test Sit to Stand: Able to stand without using hands and stabilize independently Standing Unsupported: Able to stand safely 2 minutes Sitting with Back Unsupported but Feet Supported on Floor or Stool: Able to sit safely and securely 2 minutes Stand to Sit: Sits safely with minimal use of hands Transfers: Able to transfer safely, minor use of hands Standing Unsupported with Eyes Closed: Able to stand 10 seconds safely Standing Ubsupported with Feet Together:  Able to place feet together independently and stand 1 minute safely From Standing, Reach Forward with Outstretched Arm: Can reach confidently >25 cm (10) From Standing Position, Pick up Object from Floor: Able to pick up shoe safely and easily From Standing Position, Turn to Look Behind Over each Shoulder: Looks behind from both sides and weight shifts well Turn 360 Degrees: Able to turn 360 degrees safely in 4  seconds or less Standing Unsupported, Alternately Place Feet on Step/Stool: Able to stand independently and complete 8 steps >20 seconds Standing Unsupported, One Foot in Front: Able to plae foot ahead of the other independently and hold 30 seconds Standing on One Leg: Able to lift leg independently and hold equal to or more than 3 seconds Total Score: 52         Pertinent Vitals/Pain Pain Assessment Pain Assessment: No/denies pain    Home Living Family/patient expects to be discharged to:: Private residence Living Arrangements: Other relatives (cousin) Available Help at Discharge: Family;Available 24 hours/day Type of Home: House Home Access: Ramped entrance       Home Layout: One level Home Equipment: Cane - single Librarian, academic (2 wheels);Hand held shower head      Prior Function Prior Level of Function : Independent/Modified Independent;Driving             Mobility Comments: ind, using SPC rarely ADLs Comments: ind cares for himself     Extremity/Trunk Assessment   Upper Extremity Assessment Upper Extremity Assessment: Defer to OT evaluation;Left hand dominant    Lower Extremity Assessment Lower Extremity Assessment: LLE deficits/detail (Hx of prior CVA with reported weakness and numbness. Functionally able to bear weight and SLS on Lt.)       Communication   Communication Communication: No apparent difficulties    Cognition Arousal: Alert Behavior During Therapy: WFL for tasks assessed/performed   PT - Cognitive impairments: Memory, No apparent impairments                       PT - Cognition Comments: states he does not keep up with the month but after a long pause was able to recall correct month. Following commands: Intact       Cueing Cueing Techniques: Verbal cues     General Comments General comments (skin integrity, edema, etc.): Educated on safety, symptom awareness, and modification to activities that may put pt at risk  of harm with dizziness episodes.    Exercises     Assessment/Plan    PT Assessment Patient does not need any further PT services  PT Problem List         PT Treatment Interventions      PT Goals (Current goals can be found in the Care Plan section)  Acute Rehab PT Goals Patient Stated Goal: Resolve dizziness PT Goal Formulation: All assessment and education complete, DC therapy Time For Goal Achievement: 11/03/23 Potential to Achieve Goals: Good    Frequency       Co-evaluation               AM-PAC PT 6 Clicks Mobility  Outcome Measure Help needed turning from your back to your side while in a flat bed without using bedrails?: None Help needed moving from lying on your back to sitting on the side of a flat bed without using bedrails?: None Help needed moving to and from a bed to a chair (including a wheelchair)?: None Help needed standing up from a chair using your arms (  e.g., wheelchair or bedside chair)?: None Help needed to walk in hospital room?: None Help needed climbing 3-5 steps with a railing? : None 6 Click Score: 24    End of Session   Activity Tolerance: Patient tolerated treatment well Patient left: in bed;with call bell/phone within reach;with nursing/sitter in room   PT Visit Diagnosis: Other abnormalities of gait and mobility (R26.89);Other symptoms and signs involving the nervous system (R29.898);Dizziness and giddiness (R42)    Time: 8666-8650 PT Time Calculation (min) (ACUTE ONLY): 16 min   Charges:   PT Evaluation $PT Eval Low Complexity: 1 Low   PT General Charges $$ ACUTE PT VISIT: 1 Visit         Leontine Roads, PT, DPT [facility]  Rehabilitation Services Physical Therapist Office: [phone] Website: [email or web address]   Leontine GORMAN Roads 10/20/2023, 2:12 PM

## 2023-10-20 NOTE — ED Provider Notes (Signed)
 I assumed care in signout to follow-up on neuro recs  Neuro recs are pasted below:  Morning doc.  CTA shows stenosis proximal to the L vertebral artery stent. May be implicated in the syncope.  Patient has not seen Nsx in >1 year.   Will recommend  Plavix  300mg  load and continue home DAPT, NSx consultation for intracranial atherosclerotic disease Vascular surgery consultation for extracranial vascular disease.  Cardiology consultation for cardiogenic syncope evaluation.  MRI Brain w/o to r/o new ischemic changes.   Will consult hospitalist for admission.  Plavix  and aspirin  have been ordered  Patient resting comfortably, awake and alert, no arm or leg drift.  Reports he is feeling improved   Midge Golas, MD 10/20/23 [phone]

## 2023-10-20 NOTE — ED Notes (Signed)
 Pt ambulated to restroom.

## 2023-10-20 NOTE — ED Notes (Signed)
 Pt stated, I still have a headache but it doesn't hurt as bad.

## 2023-10-20 NOTE — Progress Notes (Signed)
 Patient arrived from [facility] ED to 4E07 INAD, A&O x4.  Telemetry monitor applied and CCMD notified.  CHG bath and skin assessment completed.  Patient oriented to unit and room to include call light and phone.

## 2023-10-20 NOTE — Progress Notes (Signed)
 PROGRESS NOTE  Devin [name]  FMW:[id number] DOB: 10-29-1970 DOA: 10/19/2023 PCP: Trudy Vaughn FALCON, MD  Consultants  Brief Narrative: 53 y.o. male with a history of vertebral artery stenting, CAD s/p stents 2019, HTN, T2DM, HLD, tobacco use, hypothyroidism who presented to the ED with vague complaints of can't think straight, subsequently lost consciousness in triage. History is limited as pt does not recall much of the last several hours and no family at bedside at this time. He denies current and recent chest pain or dyspnea or palpitations. He denies numbness or weakness in a particular area. Can't tell me whether he had symptoms leading up to passing out. Doesn't think he's been dehydrated or otherwise unwell prior.    In the ED workup revealed no acute stroke, no emergent LVO, though there is focal filling defect in distal V4 segment on left, severe stenosis at that level, and 70% left carotid bulb stenosis. Tele neurology was consulted, recommending MRI brain, neurosurgery, vascular, and cardiology consultations. Patient has returned to baseline and will be admitted to [facility].   Assessment & Plan: Syncope:  -Recurrent syncopal episodes over past year, including witnessed in healthcare settings.   -Previous cardiac workup including Holter monitor with cardiology -Possibly related to vertebral artery V4 stenosis with stent-70% left carotid stenosis - Echo ordered pending - Reportedly had EEG at [facility] during syncopal episode as there was concern for sz but EEG negative per patient's sister - Pt denies preceding prodrome.  Does report consistent headaches, wonder about atypical migraines.   - will c/s neuro in AM for further evaluation  - will Maintain cardiac telemetry - Orthostatic vital signs - Avoid hypotension.  - Has cardiology follow up 9/15. Would consider outpatient cardiac monitoring at discharge if no significant dysrhythmias are identified while admitted. It appears he had a 3  day monitor in May showing rare PACs and PVCs, no significant arrhythmias.    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  - Admit to [facility] for neuroIR (s/p vertebral artery stenting x2 2019 with Dr. Dolphus). This was recommended by his neurologist last month as well-->plan will be outpatient stenting per Dr. Lester.  - Pt reports majority of symptoms occur while he is seated or at rest rather than being active     Left carotid stenosis - Vascular surgery was consulted for - appreciate further evaluation and recommendations   CAD, HLD:  - On extended DAPT, continue - Statin intolerance noted, check LDL, on repatha  as outpatient, continue zetia , fenofibrate  once confirmed by med rec.  - Repeat lipid panel notably elevated here-->wonder how often he is taking repatha  outpatient as we would expect lower LDL on this.  That said, his trigclycerides were 730 in 2023 and 741 in 2022, and much lower at 215 now.     Diabetes mellitus type II - Monitoring CBG q. ACHS, SSI coverage.  A1C pending.  - Hold home SGLT2i, januvia  pending med rec - reports CBGs have been normal during these episodes.   - reportedly on 70 units lantus  at home.  Starting semglee  20 units here at bedtime and watch CBGs.    Hypothyroidism:  - TSH low at 0.275.  Possibly contributing?   - will lower his synthroid  dosage-->after med rec, we have 250 mcg and 125 mcg doses in home med list.     Tobacco use:  - Cessation counseling provided - Declined nicotine  patch    DVT prophylaxis:  enoxaparin  (LOVENOX ) injection 40 mg  Start: 10/20/23 1600  Code Status:   Code Status: Full Code Family Communication: 2 sisters at bedside, all questions answered Level of care: Telemetry Medical Status is: Observation   Consults called: Neuro IR, vascular surgery   Subjective: Patient feels a little better than he did earlier today.  No further episodes since coming to  [facility], had multiple in ER triage and ER room itself described as eye fluttering, inability to communicate for a few seconds, some confusion after resolves.    Objective: Vitals:   10/20/23 0928 10/20/23 0930 10/20/23 1210 10/20/23 1626  BP:  131/83 124/79 (!) 129/91  Pulse: 91 91 73 74  Resp: 18 15 16 12   Temp:   97.9 F (36.6 C) 97.6 F (36.4 C)  TempSrc:   Oral Oral  SpO2: 95% 96% 97% 99%  Weight:      Height:       No intake or output data in the 24 hours ending 10/20/23 1727 Filed Weights   10/19/23 1911  Weight: 107 kg   Body mass index is 27.97 kg/m.  Gen: 53 y.o. male in no apparent distress.  Nontoxic Pulm: Non-labored breathing.  Clear to auscultation bilaterally.  CV: Regular rate and rhythm. No murmur, rub, or gallop. No JVD GI: Abdomen soft, non-tender, non-distended, with normoactive bowel sounds. No organomegaly or masses felt. Ext: Warm, no deformities,  Skin: No rashes, lesions ulcers Neuro: Alert and oriented. No focal neurological deficits. Psych: Calm  Judgement and insight appear normal. Mood & affect appropriate.     I have personally reviewed the following labs and images: CBC: Recent Labs  Lab 10/19/23 1927 10/20/23 1230  WBC 6.6 6.9  NEUTROABS 3.2  --   HGB 16.8 15.4  HCT 50.2 48.2  MCV 84.8 85.3  PLT 289 306   BMP &GFR Recent Labs  Lab 10/19/23 1927 10/20/23 1230  NA 137 139  K 3.6 3.8  CL 101 103  CO2 23 26  GLUCOSE 123* 208*  BUN 16 13  CREATININE 1.10 1.24  CALCIUM 9.6 9.5   Estimated Creatinine Clearance: 96 mL/min (by C-G formula based on SCr of 1.24 mg/dL). Liver & Pancreas: Recent Labs  Lab 10/19/23 1927  AST 15  ALT 13  ALKPHOS 77  BILITOT 0.9  PROT 7.4  ALBUMIN 3.8   No results for input(s): LIPASE, AMYLASE in the last 168 hours. No results for input(s): AMMONIA in the last 168 hours. Diabetic: No results for input(s): HGBA1C in the last 72 hours. Recent Labs  Lab 10/19/23 1921 10/20/23 1632   GLUCAP 145* 244*   Cardiac Enzymes: No results for input(s): CKTOTAL, CKMB, CKMBINDEX, TROPONINI in the last 168 hours. No results for input(s): PROBNP in the last 8760 hours. Coagulation Profile: Recent Labs  Lab 10/19/23 1927  INR 1.0   Thyroid  Function Tests: Recent Labs    10/20/23 1230  TSH 0.275*   Lipid Profile: Recent Labs    10/20/23 1230  CHOL 280*  HDL 25*  LDLCALC 212*  TRIG 215*  CHOLHDL 11.2   Anemia Panel: No results for input(s): VITAMINB12, FOLATE, FERRITIN, TIBC, IRON, RETICCTPCT in the last 72 hours. Urine analysis:    Component Value Date/Time   COLORURINE STRAW (A) 10/19/2023 2139   APPEARANCEUR CLEAR 10/19/2023 2139   LABSPEC 1.022 10/19/2023 2139   PHURINE 5.0 10/19/2023 2139   GLUCOSEU >=500 (A) 10/19/2023 2139   HGBUR SMALL (A) 10/19/2023 2139   BILIRUBINUR NEGATIVE 10/19/2023 2139   KETONESUR NEGATIVE 10/19/2023 2139  PROTEINUR NEGATIVE 10/19/2023 2139   UROBILINOGEN 0.2 10/06/2013 0305   NITRITE NEGATIVE 10/19/2023 2139   LEUKOCYTESUR NEGATIVE 10/19/2023 2139   Sepsis Labs: Invalid input(s): PROCALCITONIN, LACTICIDVEN  Microbiology: No results found for this or any previous visit (from the past 240 hours).  Radiology Studies: CT ANGIO HEAD NECK W WO CM Result Date: 10/19/2023 CLINICAL DATA:  Initial evaluation for acute neuro deficit, stroke suspected. EXAM: CT ANGIOGRAPHY HEAD AND NECK WITH AND WITHOUT CONTRAST TECHNIQUE: Multidetector CT imaging of the head and neck was performed using the standard protocol during bolus administration of intravenous contrast. Multiplanar CT image reconstructions and MIPs were obtained to evaluate the vascular anatomy. Carotid stenosis measurements (when applicable) are obtained utilizing NASCET criteria, using the distal internal carotid diameter as the denominator. RADIATION DOSE REDUCTION: This exam was performed according to the departmental dose-optimization program  which includes automated exposure control, adjustment of the mA and/or kV according to patient size and/or use of iterative reconstruction technique. CONTRAST:  60mL OMNIPAQUE  IOHEXOL  350 MG/ML SOLN, 60mL OMNIPAQUE  IOHEXOL  350 MG/ML SOLN COMPARISON:  CT from earlier the same day. FINDINGS: With standard branch pattern. Aortic atherosclerosis. No high-grade stenosis about the origin the great vessels. CTA NECK FINDINGS Aortic arch: Visualized aortic arch within normal limits for caliber Right carotid system: Right common internal carotid arteries are patent without dissection. Calcified plaque about the right carotid bulb without hemodynamically significant greater than 50% stenosis. Left carotid system: Left common and internal carotid arteries are patent without dissection. Moderate mixed plaque about the left carotid bulb with associated stenosis of up to 70% by NASCET criteria. Vertebral arteries: Both vertebral arteries arise from subclavian arteries. Atheromatous change about the origins of the vertebral arteries with associated severe left and moderate right ostial stenoses. Vertebral arteries are otherwise patent without stenosis or dissection. Skeleton: No worrisome osseous lesions. Patient is edentulous. Moderate spondylosis at C6-7. Other neck: No other acute finding. Upper chest: No other acute finding. Review of the MIP images confirms the above findings CTA HEAD FINDINGS Anterior circulation: Evaluation of the intracranial circulation limited by timing of the contrast bolus. Both internal carotid arteries patent to the termini without significant stenosis. A1 segments patent bilaterally. Normal anterior communicating artery complex. Anterior cerebral arteries patent without significant stenosis. No M1 stenosis or occlusion. Distal MCA branches perfused and symmetric. Posterior circulation: Right V4 segment patent without stenosis. Right PICA patent. Left vertebral artery patent as it crosses into the  cranial vault. Left PICA not seen. Vascular stent extending from the distal left V4 segment into distal basilar artery again seen. There is a persistent focal filling defect within the distal left V4 segment just distal to the proximal landing zone of the stent (series 6, image 145). While this could reflect artifact, the persistence of this finding a relation of prior exam suggested this likely reflects chronic thrombus or possibly a small chronic dissection. Resultant severe stenosis at this level. Left PICA not seen. Basilar otherwise patent distally without stenosis. Superior cerebellar and posterior cerebral arteries patent bilaterally. Venous sinuses: Patent allowing for timing the contrast bolus. Anatomic variants: As above.  No aneurysm. Review of the MIP images confirms the above findings IMPRESSION: 1. Vascular stent extending from the left V4 segment into the distal basilar artery. Persistent focal filling defect within the distal left V4 segment just distal to the proximal landing zone of the stent, similar to prior, and suspicious for chronic thrombus or possibly a small chronic dissection. Resultant severe stenosis at this level.  Overall, appearance is relatively similar as compared to prior CTA from 03/08/2023. 2. Moderate mixed plaque about the left carotid bulb with associated stenosis of up to 70% by NASCET criteria. 3. Atheromatous change about the origins of the vertebral arteries with associated severe left and moderate right ostial stenoses. 4.  Aortic Atherosclerosis (ICD10-I70.0). Electronically Signed   By: Morene Hoard M.D.   On: 10/19/2023 20:52   CT Angio Chest PE W and/or Wo Contrast Result Date: 10/19/2023 CLINICAL DATA:  Weakness and shortness of breath, initial encounter EXAM: CT ANGIOGRAPHY CHEST WITH CONTRAST TECHNIQUE: Multidetector CT imaging of the chest was performed using the standard protocol during bolus administration of intravenous contrast. Multiplanar CT image  reconstructions and MIPs were obtained to evaluate the vascular anatomy. RADIATION DOSE REDUCTION: This exam was performed according to the departmental dose-optimization program which includes automated exposure control, adjustment of the mA and/or kV according to patient size and/or use of iterative reconstruction technique. CONTRAST:  60mL OMNIPAQUE  IOHEXOL  350 MG/ML SOLN, 60mL OMNIPAQUE  IOHEXOL  350 MG/ML SOLN COMPARISON:  None Available. FINDINGS: Cardiovascular: Thoracic aorta shows a normal branching pattern. Mild atherosclerotic calcifications are seen. No dissection is identified. Heart is not significantly enlarged in size. Diffuse coronary calcifications are seen. The pulmonary artery shows a normal branching pattern bilaterally. No definitive filling defects to suggest pulmonary embolism are noted. Mediastinum/Nodes: Thoracic inlet is within normal limits. No hilar or mediastinal adenopathy is noted. Scattered small mediastinal nodes are noted stable from the prior exam and likely reactive in nature. The esophagus as visualized is within normal limits. Lungs/Pleura: Lungs are well aerated bilaterally. Mild emphysematous changes are noted. No focal infiltrate or effusion is seen. No parenchymal nodule is noted. Upper Abdomen: Visualized upper abdomen is unremarkable. Gallbladder has been surgically removed. Musculoskeletal: Degenerative changes of the thoracic spine are noted. Review of the MIP images confirms the above findings. IMPRESSION: No evidence of pulmonary emboli. No acute abnormality is seen. Aortic Atherosclerosis (ICD10-I70.0) and Emphysema (ICD10-J43.9). Electronically Signed   By: Oneil Devonshire M.D.   On: 10/19/2023 20:16   CT Head Wo Contrast Result Date: 10/19/2023 CLINICAL DATA:  Increasing headaches EXAM: CT HEAD WITHOUT CONTRAST TECHNIQUE: Contiguous axial images were obtained from the base of the skull through the vertex without intravenous contrast. RADIATION DOSE REDUCTION: This  exam was performed according to the departmental dose-optimization program which includes automated exposure control, adjustment of the mA and/or kV according to patient size and/or use of iterative reconstruction technique. 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 No acute hemorrhage, acute infarction or space-occupying mass lesion is seen. Vascular: Basilar arterial stent is noted. Skull: Normal. Negative for fracture or focal lesion. Sinuses/Orbits: No acute finding. Other: None. IMPRESSION: No acute intracranial abnormality noted. Chronic changes as previously described. Electronically Signed   By: Oneil Devonshire M.D.   On: 10/19/2023 20:12    Scheduled Meds:  aspirin  EC  81 mg Oral Daily   clopidogrel   75 mg Oral Daily   enoxaparin  (LOVENOX ) injection  40 mg Subcutaneous Q24H   insulin  aspart  0-20 Units Subcutaneous TID WC   insulin  aspart  0-5 Units Subcutaneous QHS   sodium chloride  flush  3 mL Intravenous Q12H   Continuous Infusions:  magnesium  sulfate Stopped (10/19/23 2101)     LOS: 0 days   35 minutes with more than 50% spent in reviewing records, counseling patient/family and coordinating care.  Reyes VEAR Gaw, MD  [facility] [email or web address] 10/20/2023, 5:27 PM

## 2023-10-20 NOTE — Consult Note (Signed)
 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  The right vertebral artery is patent and there is no basilar stenosis Asymptomatic severe left internal carotid artery stenosis Vascular risk factors of long-term and ongoing smoking, insulin -dependent diabetes, hypertension and dyslipidemia being managed medically Antiplatelet therapy includes 81 mg aspirin  and 75 mg clopidogrel   Plan:  1.  No intervention needed for the intracranial left vertebral artery stent restenosis.  I do not think this is the cause of his symptoms and the procedure entails risk with limited benefit 2. I have explained the diagnosis of an asymptomatic stenosis.  I have explained that even severe stenosis, when not causing symptoms of stroke, may have a favorable prognosis with medical therapy alone.  I have also discussed and explained the surgical options of carotid endarterectomy and carotid stenting, including percutaneous carotid stenting and TCAR.  We will follow-up in the office later this week.    Chief Complaint: Patient was seen in consultation today for  Chief Complaint  Patient presents with   Dizziness   at the request of * No referring provider recorded for this case *  Referring Physician(s): * No referring provider recorded for this case *  History of Present Illness: Devin [name] is a 53 y.o. left-handed male.  He presented yesterday with an episode of vertigo, nausea and vomiting.  He went to the emergency room where he passed out.  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  It took him several months to recover.  He was in the hospital for 13 days.  During that admission he had a stent in his  intracranial left vertebral artery that extends into the basilar artery.  Also around that time he had coronary stents placed.  His risk factors for vascular disease include long-term smoking (started when he was 53 years old), dyslipidemia, hypertension and diabetes which is insulin -dependent.  His symptoms have now resolved.  He is not having any of the vertigo or dizziness.  The nausea has resolved and he feels hungry.  He has a low-grade headache but has had a headache for several years.  He had a CT scan and CT arteriogram yesterday, both of which I reviewed.  The CT shows the old left cerebellar stroke.  No acute abnormality.  The CTA shows a severe left carotid stenosis, greater than 80%, and no left carotid stenosis.  The intracranial vertebral artery stent is patent, although there is restenosis of the proximal end.  This is unchanged from a prior study from March 08, 2023.  His right vertebral artery is patent and there is no basilar stenosis.  I also reviewed his brain MRI from 09/24/2023 which presently the left superior cerebellar stroke is unremarkable.  I asked him detailed questions about stroke, TIA and amaurosis fugax related to the left carotid stenosis, and I do not think he is having any symptoms.  I reviewed his medications which include aspirin , clopidogrel  and Repatha .      Past Medical History:  Diagnosis Date   Asthma    Chest pain    CHF (congestive heart failure) (HCC)    CKD (chronic kidney disease) 11/13/2017   Coronary artery disease    a. s/p DES x2 to LAD and DES  to OM in 11/2017   Depression    Dyspnea    GERD (gastroesophageal reflux disease)    Headache    History of kidney stones    History of rhabdomyolysis    Hyperlipidemia    Hypertension    Hypothyroidism    Ischemic cardiomyopathy    a. 11/2017: echo showing EF of 35-40%, diffuse HK, and Grade 2 DD   Kidney calculus 2014   Pericardial effusion    Small, by dobutamine echocardiogram,  04/2006   PONV (postoperative nausea and vomiting)    Sleep apnea    does not wear cpap   Stroke (HCC)    Tobacco abuse    Type 2 diabetes mellitus without complications (HCC) 10/07/2013   Vitamin D  deficiency     Past Surgical History:  Procedure Laterality Date   APPENDECTOMY     BIOPSY  12/07/2022   Procedure: BIOPSY;  Surgeon: Cinderella Deatrice FALCON, MD;  Location: [facility];  Service: Endoscopy;;   CARDIAC CATHETERIZATION  09/2012   Nonobstructive CAD with 30% proximal, 40% mid LAD disease; 30% proximal CFX; EF 55-65%   CHOLECYSTECTOMY N/A 01/27/2013   Procedure: LAPAROSCOPIC CHOLECYSTECTOMY;  Surgeon: Oneil DELENA Budge, MD;  Location: [facility];  Service: General;  Laterality: N/A;   CORONARY STENT INTERVENTION N/A 11/15/2017   Procedure: CORONARY STENT INTERVENTION;  Surgeon: Anner Alm ORN, MD;  Location: [facility];  Service: Cardiovascular;  Laterality: N/A;   ESOPHAGOGASTRODUODENOSCOPY (EGD) WITH PROPOFOL  N/A 12/07/2022   Procedure: ESOPHAGOGASTRODUODENOSCOPY (EGD) WITH PROPOFOL ;  Surgeon: Cinderella Deatrice FALCON, MD;  Location: [facility];  Service: Endoscopy;  Laterality: N/A;  9:45am;asa 3   ESOPHAGOGASTRODUODENOSCOPY ENDOSCOPY  11/2022   HERNIA REPAIR     As a child   INSERTION OF MESH N/A 11/13/2012   Procedure: INSERTION OF MESH;  Surgeon: Oneil DELENA Budge, MD;  Location: [facility];  Service: General;  Laterality: N/A;   IR ANGIO INTRA EXTRACRAN SEL COM CAROTID INNOMINATE BILAT MOD SED  12/01/2017   IR ANGIO INTRA EXTRACRAN SEL COM CAROTID INNOMINATE BILAT MOD SED  02/13/2018   IR ANGIO INTRA EXTRACRAN SEL COM CAROTID INNOMINATE BILAT MOD SED  09/15/2022   IR ANGIO VERTEBRAL SEL SUBCLAVIAN INNOMINATE BILAT MOD SED  09/15/2022   IR ANGIO VERTEBRAL SEL SUBCLAVIAN INNOMINATE UNI L MOD SED  02/13/2018   IR ANGIO VERTEBRAL SEL VERTEBRAL BILAT MOD SED  12/01/2017   IR ANGIO VERTEBRAL SEL VERTEBRAL UNI R MOD SED  02/13/2018   IR INTRA CRAN STENT  12/03/2017   IR PTA  INTRACRANIAL  02/25/2018   IR RADIOLOGIST EVAL & MGMT  07/29/2021   IR US  GUIDE VASC ACCESS RIGHT  02/13/2018   KIDNEY STONE SURGERY     LEFT HEART CATH AND CORONARY ANGIOGRAPHY N/A 11/15/2017   Procedure: LEFT HEART CATH AND CORONARY ANGIOGRAPHY;  Surgeon: Anner Alm ORN, MD;  Location: [facility] CV LAB;  Service: Cardiovascular;  Laterality: N/A;   PERCUTANEOUS NEPHROLITHOTRIPSY     RADIOLOGY WITH ANESTHESIA Left 12/03/2017   Procedure: Angioplasty with possible stenting of left VBJ;  Surgeon: Dolphus Carrion, MD;  Location: [facility] OR;  Service: Radiology;  Laterality: Left;   RADIOLOGY WITH ANESTHESIA N/A 02/25/2018   Procedure: STENT PLACEMENT;  Surgeon: Dolphus Carrion, MD;  Location: [facility] OR;  Service: Radiology;  Laterality: N/A;   UMBILICAL HERNIA REPAIR N/A 11/13/2012   Procedure: UMBILICAL HERNIORRHAPHY;  Surgeon: Oneil DELENA Budge, MD;  Location: [facility];  Service: General;  Laterality: N/A;  VARICOCELECTOMY      Allergies: Crestor [rosuvastatin], Trulicity [dulaglutide], Bee venom, and Morphine   Medications: Prior to Admission medications   Medication Sig Start Date End Date Taking? Authorizing Provider  aspirin  81 MG EC tablet Take 1 tablet (81 mg total) by mouth daily. With food 12/05/17   Pearlean Manus, MD  clopidogrel  (PLAVIX ) 75 MG tablet Take 1 tablet (75 mg total) by mouth daily. 10/15/19   Johnson, Clanford L, MD  Continuous Blood Gluc Sensor (FREESTYLE LIBRE 2 SENSOR) MISC APPLY 1 PATCH TO SKIN ONCE EVERY 14 DAYS 03/27/22   Nida, Gebreselassie W, MD  empagliflozin  (JARDIANCE ) 10 MG TABS tablet TAKE 1 TABLET BY MOUTH DAILY BEFORE BREAKFAST 05/28/23   Nida, Gebreselassie W, MD  EUTHYROX  200 MCG tablet Take 200 mcg by mouth at bedtime. 05/13/20   [provider]  Evolocumab  (REPATHA  SURECLICK) 140 MG/ML SOAJ Inject 1 pen into the skin every 14 (fourteen) days. 12/24/19   Alvan Dorn FALCON, MD  ezetimibe  (ZETIA ) 10 MG tablet Take 1 tablet (10 mg total) by mouth  daily. 10/15/19   Johnson, Clanford L, MD  famotidine  (PEPCID ) 40 MG tablet Take 40 mg by mouth daily. 11/23/20   [provider]  fenofibrate  (TRICOR ) 145 MG tablet Take 1 tablet (145 mg total) by mouth daily. 10/15/19   Johnson, Clanford L, MD  fluticasone  (FLONASE ) 50 MCG/ACT nasal spray Place 2 sprays into both nostrils 2 (two) times daily. 06/24/20   [provider]  furosemide  (LASIX ) 20 MG tablet Take 1 tablet (20 mg total) by mouth daily as needed. 10/19/21   Alvan Dorn FALCON, MD  gabapentin  (NEURONTIN ) 300 MG capsule One po q AM, one po q PM and 2 po qHS Patient taking differently: Take 300 mg by mouth 2 (two) times daily. 12/23/19   Sater, Charlie LABOR, MD  gemfibrozil  (LOPID ) 600 MG tablet Take 600 mg by mouth 2 (two) times daily. 10/09/23   [provider]  icosapent  Ethyl (VASCEPA ) 1 g capsule Take 2 capsules by mouth twice daily 02/06/22   Alvan Dorn FALCON, MD  LANTUS  SOLOSTAR 100 UNIT/ML Solostar Pen Inject 60 Units into the skin daily. 01/17/23   [provider]  levothyroxine  (SYNTHROID ) 88 MCG tablet Take 88 mcg by mouth daily. 01/25/21   [provider]  lipase/protease/amylase (CREON) 36000 UNITS CPEP capsule Take 36,000 Units by mouth. Samples given - Take 3 with meals    [provider]  methocarbamol  (ROBAXIN ) 500 MG tablet Take 500 mg by mouth 3 (three) times daily as needed for muscle spasms. 02/19/23   [provider]  nitroGLYCERIN  (NITROSTAT ) 0.4 MG SL tablet Place 1 tablet (0.4 mg total) under the tongue every 5 (five) minutes as needed for chest pain. 02/14/21   Alvan Dorn FALCON, MD  NOVOLOG  FLEXPEN 100 UNIT/ML FlexPen Inject into the skin 3 (three) times daily with meals. 20 units with meals 01/17/23   [provider]  pantoprazole  (PROTONIX ) 40 MG tablet TAKE 1 TABLET BY MOUTH DAILY 08/28/23   Ahmed, Muhammad F, MD  pioglitazone (ACTOS) 15 MG tablet Take 15 mg by mouth daily. 10/09/23   [provider]  sitaGLIPtin  (JANUVIA ) 50 MG tablet Take 1 tablet (50 mg total) by mouth daily. 06/15/21   Nida, Gebreselassie W, MD  Tiotropium Bromide Monohydrate  (SPIRIVA  RESPIMAT) 1.25 MCG/ACT AERS Inhale 2 puffs into the lungs daily. 10/03/23   Wert, Michael B, MD  Tiotropium Bromide-Olodaterol (STIOLTO RESPIMAT ) 2.5-2.5 MCG/ACT AERS Inhale 2 puffs into the lungs  daily. 10/03/23   [name], MD  VENTOLIN  HFA 108 [phone] Base) MCG/ACT inhaler Inhale into the lungs. 11/23/20   [provider]     Family History  Problem Relation Age of Onset   Coronary artery disease Father    Hypertension Father    Hyperlipidemia Father    Diabetes Father    Congestive Heart Failure Father 52   Arrhythmia Father        had an ICD   Diabetes Mother    Hypertension Mother    Hyperlipidemia Mother    Obesity Mother 58       died after bariatric surgery, liver failure and infection   Coronary artery disease Maternal Grandfather        both grandfathers and several uncles   Coronary artery disease Other    Diabetes Sister        both sisters   Stroke Maternal Aunt    Stroke Maternal Uncle     Social History   Socioeconomic History   Marital status: Divorced    Spouse name: Not on file   Number of children: Not on file   Years of education: Not on file   Highest education level: Not on file  Occupational History   Occupation: Disability     Comment:      Employer: [employer]  Tobacco Use   Smoking status: Every Day    Current packs/day: 1.00    Average packs/day: 1 pack/day for 38.6 years (38.6 ttl pk-yrs)    Types: Cigarettes    Start date: 03/13/1985    Passive exposure: Never   Smokeless tobacco: Former    Types: Snuff, Chew    Quit date: 03/14/1987  Vaping Use   Vaping status: Former   Devices: quit in 2011  Substance and Sexual Activity   Alcohol use: No   Drug use: No   Sexual activity: Not on file  Other Topics Concern   Not on file  Social History Narrative   Eats fast food  daily   No exercise outside of work   Lives in [place] with his dog   MAINTENANCE TECHNICIAN   Caffeine use: tea/soda daily   Left handed    Social Drivers of Health   Financial Resource Strain: Not on file  Food Insecurity: Not on file  Transportation Needs: No Transportation Needs (09/07/2020)   Received from [facility]   PRAPARE - Transportation    In the past 12 months, has lack of transportation kept you from medical appointments or from getting medications?: No    Lack of Transportation (Non-Medical): No  Physical Activity: Not on file  Stress: Not on file  Social Connections: Unknown (01/06/2022)   Received from [facility]   Social Network    Social Network: Not on file    Review of Systems  Respiratory:  Positive for shortness of breath.        He has chronic COPD which is not oxygen dependent  Cardiovascular:  Negative for chest pain.  Gastrointestinal:  Negative for blood in stool.  Genitourinary:  Negative for hematuria.    Vital Signs: BP 124/79 (BP Location: Left Arm)   Pulse 73   Temp 97.9 F (36.6 C) (Oral)   Resp 16   Ht 6' 5 (1.956 m)   Wt 107 kg   SpO2 97%   BMI 27.97 kg/m   Physical Exam Constitutional:      Appearance: Normal appearance.  HENT:  Ears:     Comments: Both dorsalis pedis pulses are normal.  Radial pulses are normal. Cardiovascular:     Rate and Rhythm: Normal rate and regular rhythm.     Pulses: Normal pulses.  Neurological:     Mental Status: He is alert.     Comments: Alert and oriented with normal speech expression, fluency and comprehension. Visual fields are full to confrontation.   Face is symmetric. Strength in the arms and legs is symmetric with no drift.  Sensation is normal and symmetric. No ataxia. No inattention.      Imaging: CT ANGIO HEAD NECK W WO CM Result Date: 10/19/2023 CLINICAL DATA:  Initial evaluation for acute neuro deficit, stroke suspected. EXAM: CT ANGIOGRAPHY HEAD AND  NECK WITH AND WITHOUT CONTRAST TECHNIQUE: Multidetector CT imaging of the head and neck was performed using the standard protocol during bolus administration of intravenous contrast. Multiplanar CT image reconstructions and MIPs were obtained to evaluate the vascular anatomy. Carotid stenosis measurements (when applicable) are obtained utilizing NASCET criteria, using the distal internal carotid diameter as the denominator. RADIATION DOSE REDUCTION: This exam was performed according to the departmental dose-optimization program which includes automated exposure control, adjustment of the mA and/or kV according to patient size and/or use of iterative reconstruction technique. CONTRAST:  60mL OMNIPAQUE  IOHEXOL  350 MG/ML SOLN, 60mL OMNIPAQUE  IOHEXOL  350 MG/ML SOLN COMPARISON:  CT from earlier the same day. FINDINGS: With standard branch pattern. Aortic atherosclerosis. No high-grade stenosis about the origin the great vessels. CTA NECK FINDINGS Aortic arch: Visualized aortic arch within normal limits for caliber Right carotid system: Right common internal carotid arteries are patent without dissection. Calcified plaque about the right carotid bulb without hemodynamically significant greater than 50% stenosis. Left carotid system: Left common and internal carotid arteries are patent without dissection. Moderate mixed plaque about the left carotid bulb with associated stenosis of up to 70% by NASCET criteria. Vertebral arteries: Both vertebral arteries arise from subclavian arteries. Atheromatous change about the origins of the vertebral arteries with associated severe left and moderate right ostial stenoses. Vertebral arteries are otherwise patent without stenosis or dissection. Skeleton: No worrisome osseous lesions. Patient is edentulous. Moderate spondylosis at C6-7. Other neck: No other acute finding. Upper chest: No other acute finding. Review of the MIP images confirms the above findings CTA HEAD FINDINGS Anterior  circulation: Evaluation of the intracranial circulation limited by timing of the contrast bolus. Both internal carotid arteries patent to the termini without significant stenosis. A1 segments patent bilaterally. Normal anterior communicating artery complex. Anterior cerebral arteries patent without significant stenosis. No M1 stenosis or occlusion. Distal MCA branches perfused and symmetric. Posterior circulation: Right V4 segment patent without stenosis. Right PICA patent. Left vertebral artery patent as it crosses into the cranial vault. Left PICA not seen. Vascular stent extending from the distal left V4 segment into distal basilar artery again seen. There is a persistent focal filling defect within the distal left V4 segment just distal to the proximal landing zone of the stent (series 6, image 145). While this could reflect artifact, the persistence of this finding a relation of prior exam suggested this likely reflects chronic thrombus or possibly a small chronic dissection. Resultant severe stenosis at this level. Left PICA not seen. Basilar otherwise patent distally without stenosis. Superior cerebellar and posterior cerebral arteries patent bilaterally. Venous sinuses: Patent allowing for timing the contrast bolus. Anatomic variants: As above.  No aneurysm. Review of the MIP images confirms the above findings IMPRESSION: 1. Vascular  stent extending from the left V4 segment into the distal basilar artery. Persistent focal filling defect within the distal left V4 segment just distal to the proximal landing zone of the stent, similar to prior, and suspicious for chronic thrombus or possibly a small chronic dissection. Resultant severe stenosis at this level. Overall, appearance is relatively similar as compared to prior CTA from 03/08/2023. 2. Moderate mixed plaque about the left carotid bulb with associated stenosis of up to 70% by NASCET criteria. 3. Atheromatous change about the origins of the vertebral  arteries with associated severe left and moderate right ostial stenoses. 4.  Aortic Atherosclerosis (ICD10-I70.0). Electronically Signed   By: Morene Hoard M.D.   On: 10/19/2023 20:52   CT Angio Chest PE W and/or Wo Contrast Result Date: 10/19/2023 CLINICAL DATA:  Weakness and shortness of breath, initial encounter EXAM: CT ANGIOGRAPHY CHEST WITH CONTRAST TECHNIQUE: Multidetector CT imaging of the chest was performed using the standard protocol during bolus administration of intravenous contrast. Multiplanar CT image reconstructions and MIPs were obtained to evaluate the vascular anatomy. RADIATION DOSE REDUCTION: This exam was performed according to the departmental dose-optimization program which includes automated exposure control, adjustment of the mA and/or kV according to patient size and/or use of iterative reconstruction technique. CONTRAST:  60mL OMNIPAQUE  IOHEXOL  350 MG/ML SOLN, 60mL OMNIPAQUE  IOHEXOL  350 MG/ML SOLN COMPARISON:  None Available. FINDINGS: Cardiovascular: Thoracic aorta shows a normal branching pattern. Mild atherosclerotic calcifications are seen. No dissection is identified. Heart is not significantly enlarged in size. Diffuse coronary calcifications are seen. The pulmonary artery shows a normal branching pattern bilaterally. No definitive filling defects to suggest pulmonary embolism are noted. Mediastinum/Nodes: Thoracic inlet is within normal limits. No hilar or mediastinal adenopathy is noted. Scattered small mediastinal nodes are noted stable from the prior exam and likely reactive in nature. The esophagus as visualized is within normal limits. Lungs/Pleura: Lungs are well aerated bilaterally. Mild emphysematous changes are noted. No focal infiltrate or effusion is seen. No parenchymal nodule is noted. Upper Abdomen: Visualized upper abdomen is unremarkable. Gallbladder has been surgically removed. Musculoskeletal: Degenerative changes of the thoracic spine are noted. Review  of the MIP images confirms the above findings. IMPRESSION: No evidence of pulmonary emboli. No acute abnormality is seen. Aortic Atherosclerosis (ICD10-I70.0) and Emphysema (ICD10-J43.9). Electronically Signed   By: Oneil Devonshire M.D.   On: 10/19/2023 20:16   CT Head Wo Contrast Result Date: 10/19/2023 CLINICAL DATA:  Increasing headaches EXAM: CT HEAD WITHOUT CONTRAST TECHNIQUE: Contiguous axial images were obtained from the base of the skull through the vertex without intravenous contrast. RADIATION DOSE REDUCTION: This exam was performed according to the departmental dose-optimization program which includes automated exposure control, adjustment of the mA and/or kV according to patient size and/or use of iterative reconstruction technique. 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 No acute hemorrhage, acute infarction or space-occupying mass lesion is seen. Vascular: Basilar arterial stent is noted. Skull: Normal. Negative for fracture or focal lesion. Sinuses/Orbits: No acute finding. Other: None. IMPRESSION: No acute intracranial abnormality noted. Chronic changes as previously described. Electronically Signed   By: Oneil Devonshire M.D.   On: 10/19/2023 20:12   MR BRAIN/IAC W WO CONTRAST Result Date: 09/24/2023 CLINICAL DATA:  Vertigo EXAM: MRI HEAD WITHOUT AND WITH CONTRAST TECHNIQUE: Multiplanar, multiecho pulse sequences of the brain and surrounding structures were obtained without and with intravenous contrast. CONTRAST:  10mL GADAVIST  GADOBUTROL  1 MMOL/ML IV  SOLN COMPARISON:  March 08, 2023 FINDINGS: MRI brain: The internal auditory canals are normal and symmetric. The vestibular and cochlear structures are normal and symmetric. 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 These do not have restricted diffusion or  enhancement. No acute infarct. The ventricles are normal. No mass lesion. There are normal flow signals in the carotid arteries and basilar artery. No significant bone marrow signal abnormality. No significant abnormality in the paranasal sinuses or soft tissues. 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 This is unchanged from the previous MRI March 08, 2023. No other significant abnormality. Electronically Signed   By: Nancyann Burns M.D.   On: 09/24/2023 14:49    Labs:  CBC: Recent Labs    03/08/23 1655 10/03/23 0946 10/19/23 1927 10/20/23 1230  WBC 8.2 8.9 6.6 6.9  HGB 16.4 16.5 16.8 15.4  HCT 49.9 51.9* 50.2 48.2  PLT 307 299 289 306    COAGS: Recent Labs    03/08/23 1744 10/19/23 1927  INR 1.0 1.0  APTT 26  --     BMP: Recent Labs    12/05/22 1453 03/08/23 1655 10/19/23 1927 10/20/23 1230  NA 132* 137 137 139  K 4.5 3.4* 3.6 3.8  CL 97* 101 101 103  CO2 23 27 23 26   GLUCOSE 498* 90 123* 208*  BUN 19 16 16 13   CALCIUM 9.5 9.9 9.6 9.5  CREATININE 0.88 0.95 1.10 1.24  GFRNONAA >60 >60 >60 >60    LIVER FUNCTION TESTS: Recent Labs    11/07/22 1200 03/08/23 1655 10/19/23 1927  BILITOT 0.2 0.5 0.9  AST 9 15 15   ALT 9 12 13   ALKPHOS 148* 129* 77  PROT 6.7 7.5 7.4  ALBUMIN 4.2 3.8 3.8         Electronically Signed: Nancyann LULLA Burns  10/20/2023, 1:38 PM

## 2023-10-20 NOTE — H&P (Signed)
 History and Physical    Patient: Devin [name] FMW:[id number] DOB: 17-Aug-1970 DOA: 10/19/2023 DOS: the patient was seen and examined on 10/20/2023 PCP: Trudy Vaughn FALCON, MD  Patient coming from: Home  Chief Complaint:  Chief Complaint  Patient presents with   Dizziness   HPI: Devin [name] is a 53 y.o. male with a history of vertebral artery stenting, CAD s/p stents 2019, HTN, T2DM, HLD, tobacco use, hypothyroidism who presented to the ED with vague complaints of can't think straight, subsequently lost consciousness in triage. History is limited as pt does not recall much of the last several hours and no family at bedside at this time. He denies current and recent chest pain or dyspnea or palpitations. He denies numbness or weakness in a particular area. Can't tell me whether he had symptoms leading up to passing out. Doesn't think he's been dehydrated or otherwise unwell prior.   In the ED workup revealed no acute stroke, no emergent LVO, though there is focal filling defect in distal V4 segment on left, severe stenosis at that level, and 70% left carotid bulb stenosis. Tele neurology was consulted, recommending MRI brain, neurosurgery, vascular, and cardiology consultations. Patient has returned to baseline and will be admitted to [facility].  Review of Systems: unable to review all systems due to the inability of the patient to answer questions. Past Medical History:  Diagnosis Date   Asthma    Chest pain    CHF (congestive heart failure) (HCC)    CKD (chronic kidney disease) 11/13/2017   Coronary artery disease    a. s/p DES x2 to LAD and DES to OM in 11/2017   Depression    Dyspnea    GERD (gastroesophageal reflux disease)    Headache    History of kidney stones    History of rhabdomyolysis    Hyperlipidemia    Hypertension    Hypothyroidism    Ischemic cardiomyopathy    a. 11/2017: echo showing EF of 35-40%, diffuse HK, and Grade 2 DD   Kidney calculus 2014   Pericardial  effusion    Small, by dobutamine echocardiogram, 04/2006   PONV (postoperative nausea and vomiting)    Sleep apnea    does not wear cpap   Stroke (HCC)    Tobacco abuse    Type 2 diabetes mellitus without complications (HCC) 10/07/2013   Vitamin D  deficiency    Past Surgical History:  Procedure Laterality Date   APPENDECTOMY     BIOPSY  12/07/2022   Procedure: BIOPSY;  Surgeon: Cinderella Deatrice FALCON, MD;  Location: [facility];  Service: Endoscopy;;   CARDIAC CATHETERIZATION  09/2012   Nonobstructive CAD with 30% proximal, 40% mid LAD disease; 30% proximal CFX; EF 55-65%   CHOLECYSTECTOMY N/A 01/27/2013   Procedure: LAPAROSCOPIC CHOLECYSTECTOMY;  Surgeon: Oneil DELENA Budge, MD;  Location: [facility];  Service: General;  Laterality: N/A;   CORONARY STENT INTERVENTION N/A 11/15/2017   Procedure: CORONARY STENT INTERVENTION;  Surgeon: Anner Alm ORN, MD;  Location: [facility];  Service: Cardiovascular;  Laterality: N/A;   ESOPHAGOGASTRODUODENOSCOPY (EGD) WITH PROPOFOL  N/A 12/07/2022   Procedure: ESOPHAGOGASTRODUODENOSCOPY (EGD) WITH PROPOFOL ;  Surgeon: Cinderella Deatrice FALCON, MD;  Location: [facility];  Service: Endoscopy;  Laterality: N/A;  9:45am;asa 3   ESOPHAGOGASTRODUODENOSCOPY ENDOSCOPY  11/2022   HERNIA REPAIR     As a child   INSERTION OF MESH N/A 11/13/2012   Procedure: INSERTION OF MESH;  Surgeon: Oneil DELENA Budge, MD;  Location: [facility];  Service: General;  Laterality: N/A;   IR ANGIO INTRA EXTRACRAN SEL COM CAROTID INNOMINATE BILAT MOD SED  12/01/2017   IR ANGIO INTRA EXTRACRAN SEL COM CAROTID INNOMINATE BILAT MOD SED  02/13/2018   IR ANGIO INTRA EXTRACRAN SEL COM CAROTID INNOMINATE BILAT MOD SED  09/15/2022   IR ANGIO VERTEBRAL SEL SUBCLAVIAN INNOMINATE BILAT MOD SED  09/15/2022   IR ANGIO VERTEBRAL SEL SUBCLAVIAN INNOMINATE UNI L MOD SED  02/13/2018   IR ANGIO VERTEBRAL SEL VERTEBRAL BILAT MOD SED  12/01/2017   IR ANGIO VERTEBRAL SEL VERTEBRAL UNI R MOD SED  02/13/2018   IR  INTRA CRAN STENT  12/03/2017   IR PTA INTRACRANIAL  02/25/2018   IR RADIOLOGIST EVAL & MGMT  07/29/2021   IR US  GUIDE VASC ACCESS RIGHT  02/13/2018   KIDNEY STONE SURGERY     LEFT HEART CATH AND CORONARY ANGIOGRAPHY N/A 11/15/2017   Procedure: LEFT HEART CATH AND CORONARY ANGIOGRAPHY;  Surgeon: Anner Alm ORN, MD;  Location: [facility];  Service: Cardiovascular;  Laterality: N/A;   PERCUTANEOUS NEPHROLITHOTRIPSY     RADIOLOGY WITH ANESTHESIA Left 12/03/2017   Procedure: Angioplasty with possible stenting of left VBJ;  Surgeon: Dolphus Carrion, MD;  Location: [facility];  Service: Radiology;  Laterality: Left;   RADIOLOGY WITH ANESTHESIA N/A 02/25/2018   Procedure: STENT PLACEMENT;  Surgeon: Dolphus Carrion, MD;  Location: [facility];  Service: Radiology;  Laterality: N/A;   UMBILICAL HERNIA REPAIR N/A 11/13/2012   Procedure: UMBILICAL HERNIORRHAPHY;  Surgeon: Oneil DELENA Budge, MD;  Location: [facility];  Service: General;  Laterality: N/A;   VARICOCELECTOMY     Social History:  reports that he has been smoking cigarettes. He started smoking about 38 years ago. He has a 38.6 pack-year smoking history. He has never been exposed to tobacco smoke. He quit smokeless tobacco use about 36 years ago.  His smokeless tobacco use included snuff and chew. He reports that he does not drink alcohol and does not use drugs.  Allergies  Allergen Reactions   Crestor [Rosuvastatin] Other (See Comments)    rhabdomyolosis   Trulicity [Dulaglutide] Nausea And Vomiting   Bee Venom Swelling    SWELLING REACTION UNSPECIFIED    Morphine      Panic attack    Family History  Problem Relation Age of Onset   Coronary artery disease Father    Hypertension Father    Hyperlipidemia Father    Diabetes Father    Congestive Heart Failure Father 51   Arrhythmia Father        had an ICD   Diabetes Mother    Hypertension Mother    Hyperlipidemia Mother    Obesity Mother 45       died after bariatric surgery, liver  failure and infection   Coronary artery disease Maternal Grandfather        both grandfathers and several uncles   Coronary artery disease Other    Diabetes Sister        both sisters   Stroke Maternal Aunt    Stroke Maternal Uncle     Prior to Admission medications   Medication Sig Start Date End Date Taking? Authorizing Provider  aspirin  81 MG EC tablet Take 1 tablet (81 mg total) by mouth daily. With food 12/05/17   Pearlean Manus, MD  clopidogrel  (PLAVIX ) 75 MG tablet Take 1 tablet (75 mg total) by mouth daily. 10/15/19   Vicci Afton CROME, MD  Continuous Blood Gluc Receiver (FREESTYLE [place]  2 READER) DEVI USE AS DIRECTED 09/02/21   Nida, Gebreselassie W, MD  Continuous Blood Gluc Sensor (FREESTYLE LIBRE 2 SENSOR) MISC APPLY 1 PATCH TO SKIN ONCE EVERY 14 DAYS 03/27/22   Lenis Ethelle ORN, MD  empagliflozin  (JARDIANCE ) 10 MG TABS tablet TAKE 1 TABLET BY MOUTH DAILY BEFORE BREAKFAST 05/28/23   Nida, Gebreselassie W, MD  EUTHYROX  200 MCG tablet Take 200 mcg by mouth at bedtime. 05/13/20   [provider]  Evolocumab  (REPATHA  SURECLICK) 140 MG/ML SOAJ Inject 1 pen into the skin every 14 (fourteen) days. 12/24/19   Alvan Dorn FALCON, MD  ezetimibe  (ZETIA ) 10 MG tablet Take 1 tablet (10 mg total) by mouth daily. 10/15/19   Johnson, Clanford L, MD  famotidine  (PEPCID ) 40 MG tablet Take 40 mg by mouth daily. 11/23/20   [provider]  fenofibrate  (TRICOR ) 145 MG tablet Take 1 tablet (145 mg total) by mouth daily. 10/15/19   Johnson, Clanford L, MD  fluticasone  (FLONASE ) 50 MCG/ACT nasal spray Place 2 sprays into both nostrils 2 (two) times daily. 06/24/20   [provider]  furosemide  (LASIX ) 20 MG tablet Take 1 tablet (20 mg total) by mouth daily as needed. 10/19/21   Alvan Dorn FALCON, MD  gabapentin  (NEURONTIN ) 300 MG capsule One po q AM, one po q PM and 2 po qHS Patient taking differently: Take 300 mg by mouth 2 (two) times daily. 12/23/19   Sater, Charlie LABOR, MD   glucose blood (ACCU-CHEK GUIDE) test strip Test glucose 2 times a day 08/10/20   Lenis Ethelle ORN, MD  icosapent  Ethyl (VASCEPA ) 1 g capsule Take 2 capsules by mouth twice daily 02/06/22   Alvan Dorn FALCON, MD  Insulin  Pen Needle (B-D ULTRAFINE III SHORT PEN) 31G X 8 MM MISC Use as directed to inject insulin  2 times daily. 05/02/21   Nida, Gebreselassie W, MD  LANTUS  SOLOSTAR 100 UNIT/ML Solostar Pen Inject 60 Units into the skin daily. 01/17/23   [provider]  levothyroxine  (SYNTHROID ) 88 MCG tablet Take 88 mcg by mouth daily. 01/25/21   [provider]  lipase/protease/amylase (CREON) 36000 UNITS CPEP capsule Take 36,000 Units by mouth. Samples given - Take 3 with meals    [provider]  methocarbamol  (ROBAXIN ) 500 MG tablet Take 500 mg by mouth 3 (three) times daily as needed for muscle spasms. 02/19/23   [provider]  nitroGLYCERIN  (NITROSTAT ) 0.4 MG SL tablet Place 1 tablet (0.4 mg total) under the tongue every 5 (five) minutes as needed for chest pain. 02/14/21   Alvan Dorn FALCON, MD  NOVOLOG  FLEXPEN 100 UNIT/ML FlexPen Inject into the skin 3 (three) times daily with meals. 20 units with meals 01/17/23   [provider]  pantoprazole  (PROTONIX ) 40 MG tablet TAKE 1 TABLET BY MOUTH DAILY 08/28/23   Ahmed, Deatrice FALCON, MD  sitaGLIPtin  (JANUVIA ) 50 MG tablet Take 1 tablet (50 mg total) by mouth daily. 06/15/21   Nida, Gebreselassie W, MD  Tiotropium Bromide Monohydrate  (SPIRIVA  RESPIMAT) 1.25 MCG/ACT AERS Inhale 2 puffs into the lungs daily. 10/03/23   Wert, Michael B, MD  Tiotropium Bromide-Olodaterol (STIOLTO RESPIMAT ) 2.5-2.5 MCG/ACT AERS Inhale 2 puffs into the lungs daily. 10/03/23   [name], MD  VENTOLIN  HFA 108 (90 Base) MCG/ACT inhaler Inhale into the lungs. 11/23/20   [provider]    Physical Exam: Vitals:   10/20/23 0229 10/20/23 0230 10/20/23 0248 10/20/23 0305  BP:  107/66    Pulse: 87 94 94 80  Resp: (!) 21 (!)  26 20 (!) 21  Temp:      TempSrc:      SpO2: 91% 93% 95% 96%  Weight:      Height:      Gen: Chronically ill-appearing male in no acute distress Pulm: Clear, nonlabored  CV: RRR, no MRG or edema GI: Soft, NT, ND, +BS  Neuro: Alert and oriented. No new focal deficits. Ext: Warm, no deformities. Skin: No rashes, lesions or ulcers on visualized skin   Data Reviewed: ECG: NSR with RAD, QTc UDS: +opioid, negative otherwise, EtOH negative.  Tn 4 > 4 LA 1.4 UA micro wnl. Small hgb without RBCs, >500 glucose CBC and CMP normal with glucose 123.  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 70% left carotid bulb stenosis. Severe left and moderate right vertebral artery stenoses.   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  - MRI brain per neurology - Admit to [facility] for neuroIR (s/p vertebral artery stenting x2 2019 with Dr. Dolphus). This was recommended by his neurologist last month as well. Consider vascular surgery consultation vs. referral for carotid stenosis as well.  - Maintain cardiac telemetry - Check echocardiogram. Remote hx pericardial effusion.  - Orthostatic vital signs - Avoid hypotension.  - Has cardiology follow up 9/15. Would consider outpatient cardiac monitoring at discharge if no significant dysrhythmias are identified while admitted. It appears he had a 3 day monitor in May showing rare PACs and PVCs, no significant arrhythmias.   CAD, HLD:  - On extended DAPT, continue - Statin intolerance noted, check LDL, on repatha  as outpatient, continue zetia , fenofibrate  once confirmed by med rec.   T2DM:  - SSI - Hold home SGLT2i, januvia  pending med  rec  Hypothyroidism:  - Reorder synthroid  once dose confirmed. - Check TSH   Tobacco use:  - Cessation counseling provided - Declined nicotine  patch   Advance Care Planning: Full code  Consults: Teleneurology, will need neurointerventional radiology consult in AM +/- vascular surgery curbside/consult.   Family Communication: None  Severity of Illness: The appropriate patient status for this patient is OBSERVATION. Observation status is judged to be reasonable and necessary in order to provide the required intensity of service to ensure the patient's safety. The patient's presenting symptoms, physical exam findings, and initial radiographic and laboratory data in the context of their medical condition is felt to place them at decreased risk for further clinical deterioration. Furthermore, it is anticipated that the patient will be medically stable for discharge from the hospital within 2 midnights of admission.   Author: Bernardino KATHEE Come, MD 10/20/2023 4:10 AM  For on call review [email or web address].

## 2023-10-20 NOTE — Progress Notes (Signed)
 PROGRESS NOTE    Patient: Devin [name]                            PCP: Trudy Vaughn FALCON, MD                    DOB: Oct 01, 1970            DOA: 10/19/2023 FMW:[id number]             DOS: 10/20/2023, 10:03 AM   LOS: 0 days   Date of Service: The patient was seen and examined on 10/20/2023  Subjective:   The patient was seen and examined this morning. Hemodynamically stable. No issues overnight .  Brief Narrative:    BROCKTON MCKESSON is a 53 y.o. male with a history of vertebral artery stenting, CAD s/p stents 2019, HTN, T2DM, HLD, tobacco use, hypothyroidism who presented to the ED with vague complaints of can't think straight, subsequently lost consciousness in triage. History is limited as pt does not recall much of the last several hours and no family at bedside at this time. He denies current and recent chest pain or dyspnea or palpitations. He denies numbness or weakness in a particular area. Can't tell me whether he had symptoms leading up to passing out. Doesn't think he's been dehydrated or otherwise unwell prior.    In the ED workup revealed no acute stroke, no emergent LVO, though there is focal filling defect in distal V4 segment on left, severe stenosis at that level, and 70% left carotid bulb stenosis. Tele neurology was consulted, recommending MRI brain, neurosurgery, vascular, and cardiology consultations. Patient has returned to baseline and will be admitted to [facility].     Assessment & Plan:   Principal Problem:   Vertebrobasilar artery stenosis Active Problems:   Essential hypertension, benign   Mixed hyperlipidemia   Hypothyroidism   DM type 2 causing vascular disease (HCC)   Coronary artery disease   History of stroke   Basilar artery stenosis, symptomatic, without infarction   COPD GOLD 2 / still smoking     Assessment and Plan: Syncope:  -Recurrent syncopal episodes -Previous cardiac workup including Holter monitor with cardiology -Possibly related to  vertebral artery V4 stenosis with stent-70% left carotid stenosis Per CT angiogram of head and neck   - Neuroimaging concerning for vertebrobasilar insufficiency contributing, cardiogenic syncope not ruled out.  - MRI brain per neurology - Admit to [facility] for neuroIR (s/p vertebral artery stenting x2 2019 with Dr. Dolphus). This was recommended by his neurologist last month as well.   - Vascular surgery was consulted for left carotid stenosis-appreciate further evaluation and recommendations   - will Maintain cardiac telemetry - Check echocardiogram. (Remote hx pericardial effusion)  - Orthostatic vital signs - Avoid hypotension.  - Has cardiology follow up 9/15. Would consider outpatient cardiac monitoring at discharge if no significant dysrhythmias are identified while admitted. It appears he had a 3 day monitor in May showing rare PACs and PVCs, no significant arrhythmias.    CAD, HLD:  - On extended DAPT, continue - Statin intolerance noted, check LDL, on repatha  as outpatient, continue zetia , fenofibrate  once confirmed by med rec.    Diabetes mellitus type II - Monitoring CBG q. ACHS, SSI coverage - Hold home SGLT2i, januvia  pending med rec   Hypothyroidism:  - Reorder synthroid  once dose confirmed. - Check TSH    Tobacco use:  - Cessation counseling provided -  Declined nicotine  patch    Advance Care Planning: Full code   Consults:  Consulted vascular surgeon Dr. Pearline -discussed with PA, Dr. Lonell neurointervention Teleneurology,   ----------------------------------------------------------------------------------------------------------------- Nutritional status:  The patient's BMI is: Body mass index is 27.97 kg/m. I agree with the assessment and plan as outlined  ------------------------------------------------------------------------------------------------------------------------------------------------  DVT prophylaxis:  SCDs   Code Status:   Code Status:  Full Code  Family Communication: No family member present at bedside-  -Advance care planning has been discussed.   Admission status:   Status is: Observation The patient remains OBS appropriate and will d/c before 2 midnights.   Disposition: From  - home             Planning for discharge in 1-2 days   Procedures:   No admission procedures for hospital encounter.   Antimicrobials:  Anti-infectives (From admission, onward)    None        Medication:   aspirin  EC  81 mg Oral Daily   clopidogrel   75 mg Oral Daily       Objective:   Vitals:   10/20/23 0926 10/20/23 0926 10/20/23 0927 10/20/23 0928  BP: 122/78     Pulse: 85  88 91  Resp: 13  17 18   Temp:  98.7 F (37.1 C)    TempSrc:  Oral    SpO2: 96%  95% 95%  Weight:      Height:       No intake or output data in the 24 hours ending 10/20/23 1003 Filed Weights   10/19/23 1911  Weight: 107 kg     Physical examination:   General:  AAO x 3,  cooperative, no distress;   HEENT:  Normocephalic, PERRL, otherwise with in Normal limits   Neuro:  CNII-XII intact. , normal motor and sensation, reflexes intact   Lungs:   Clear to auscultation BL, Respirations unlabored,  No wheezes / crackles  Cardio:    S1/S2, RRR, No murmure, No Rubs or Gallops   Abdomen:  Soft, non-tender, bowel sounds active all four quadrants, no guarding or peritoneal signs.  Muscular  skeletal:  Limited exam -global generalized weaknesses - in bed, able to move all 4 extremities,   2+ pulses,  symmetric, No pitting edema  Skin:  Dry, warm to touch, negative for any Rashes,  Wounds: Please see nursing documentation       ------------------------------------------------------------------------------------------------------------------------------------------    LABs:     Latest Ref Rng & Units 10/19/2023    7:27 PM 10/03/2023    9:46 AM 03/08/2023    4:55 PM  CBC  WBC 4.0 - 10.5 K/uL 6.6  8.9  8.2   Hemoglobin 13.0 - 17.0  g/dL 83.1  83.4  83.5   Hematocrit 39.0 - 52.0 % 50.2  51.9  49.9   Platelets 150 - 400 K/uL 289  299  307       Latest Ref Rng & Units 10/19/2023    7:27 PM 03/08/2023    4:55 PM 12/05/2022    2:53 PM  CMP  Glucose 70 - 99 mg/dL 876  90  501   BUN 6 - 20 mg/dL 16  16  19    Creatinine 0.61 - 1.24 mg/dL 8.89  9.04  9.11   Sodium 135 - 145 mmol/L 137  137  132   Potassium 3.5 - 5.1 mmol/L 3.6  3.4  4.5   Chloride 98 - 111 mmol/L 101  101  97   CO2 22 -  32 mmol/L 23  27  23    Calcium 8.9 - 10.3 mg/dL 9.6  9.9  9.5   Total Protein 6.5 - 8.1 g/dL 7.4  7.5    Total Bilirubin 0.0 - 1.2 mg/dL 0.9  0.5    Alkaline Phos 38 - 126 U/L 77  129    AST 15 - 41 U/L 15  15    ALT 0 - 44 U/L 13  12         Micro Results No results found for this or any previous visit (from the past 240 hours).  Radiology Reports CT ANGIO HEAD NECK W WO CM Result Date: 10/19/2023 CLINICAL DATA:  Initial evaluation for acute neuro deficit, stroke suspected. EXAM: CT ANGIOGRAPHY HEAD AND NECK WITH AND WITHOUT CONTRAST TECHNIQUE: Multidetector CT imaging of the head and neck was performed using the standard protocol during bolus administration of intravenous contrast. Multiplanar CT image reconstructions and MIPs were obtained to evaluate the vascular anatomy. Carotid stenosis measurements (when applicable) are obtained utilizing NASCET criteria, using the distal internal carotid diameter as the denominator. RADIATION DOSE REDUCTION: This exam was performed according to the departmental dose-optimization program which includes automated exposure control, adjustment of the mA and/or kV according to patient size and/or use of iterative reconstruction technique. CONTRAST:  60mL OMNIPAQUE  IOHEXOL  350 MG/ML SOLN, 60mL OMNIPAQUE  IOHEXOL  350 MG/ML SOLN COMPARISON:  CT from earlier the same day. FINDINGS: With standard branch pattern. Aortic atherosclerosis. No high-grade stenosis about the origin the great vessels. CTA NECK FINDINGS  Aortic arch: Visualized aortic arch within normal limits for caliber Right carotid system: Right common internal carotid arteries are patent without dissection. Calcified plaque about the right carotid bulb without hemodynamically significant greater than 50% stenosis. Left carotid system: Left common and internal carotid arteries are patent without dissection. Moderate mixed plaque about the left carotid bulb with associated stenosis of up to 70% by NASCET criteria. Vertebral arteries: Both vertebral arteries arise from subclavian arteries. Atheromatous change about the origins of the vertebral arteries with associated severe left and moderate right ostial stenoses. Vertebral arteries are otherwise patent without stenosis or dissection. Skeleton: No worrisome osseous lesions. Patient is edentulous. Moderate spondylosis at C6-7. Other neck: No other acute finding. Upper chest: No other acute finding. Review of the MIP images confirms the above findings CTA HEAD FINDINGS Anterior circulation: Evaluation of the intracranial circulation limited by timing of the contrast bolus. Both internal carotid arteries patent to the termini without significant stenosis. A1 segments patent bilaterally. Normal anterior communicating artery complex. Anterior cerebral arteries patent without significant stenosis. No M1 stenosis or occlusion. Distal MCA branches perfused and symmetric. Posterior circulation: Right V4 segment patent without stenosis. Right PICA patent. Left vertebral artery patent as it crosses into the cranial vault. Left PICA not seen. Vascular stent extending from the distal left V4 segment into distal basilar artery again seen. There is a persistent focal filling defect within the distal left V4 segment just distal to the proximal landing zone of the stent (series 6, image 145). While this could reflect artifact, the persistence of this finding a relation of prior exam suggested this likely reflects chronic thrombus  or possibly a small chronic dissection. Resultant severe stenosis at this level. Left PICA not seen. Basilar otherwise patent distally without stenosis. Superior cerebellar and posterior cerebral arteries patent bilaterally. Venous sinuses: Patent allowing for timing the contrast bolus. Anatomic variants: As above.  No aneurysm. Review of the MIP images confirms the above findings IMPRESSION:  1. Vascular stent extending from the left V4 segment into the distal basilar artery. Persistent focal filling defect within the distal left V4 segment just distal to the proximal landing zone of the stent, similar to prior, and suspicious for chronic thrombus or possibly a small chronic dissection. Resultant severe stenosis at this level. Overall, appearance is relatively similar as compared to prior CTA from 03/08/2023. 2. Moderate mixed plaque about the left carotid bulb with associated stenosis of up to 70% by NASCET criteria. 3. Atheromatous change about the origins of the vertebral arteries with associated severe left and moderate right ostial stenoses. 4.  Aortic Atherosclerosis (ICD10-I70.0). Electronically Signed   By: Morene Hoard M.D.   On: 10/19/2023 20:52   CT Angio Chest PE W and/or Wo Contrast Result Date: 10/19/2023 CLINICAL DATA:  Weakness and shortness of breath, initial encounter EXAM: CT ANGIOGRAPHY CHEST WITH CONTRAST TECHNIQUE: Multidetector CT imaging of the chest was performed using the standard protocol during bolus administration of intravenous contrast. Multiplanar CT image reconstructions and MIPs were obtained to evaluate the vascular anatomy. RADIATION DOSE REDUCTION: This exam was performed according to the departmental dose-optimization program which includes automated exposure control, adjustment of the mA and/or kV according to patient size and/or use of iterative reconstruction technique. CONTRAST:  60mL OMNIPAQUE  IOHEXOL  350 MG/ML SOLN, 60mL OMNIPAQUE  IOHEXOL  350 MG/ML SOLN  COMPARISON:  None Available. FINDINGS: Cardiovascular: Thoracic aorta shows a normal branching pattern. Mild atherosclerotic calcifications are seen. No dissection is identified. Heart is not significantly enlarged in size. Diffuse coronary calcifications are seen. The pulmonary artery shows a normal branching pattern bilaterally. No definitive filling defects to suggest pulmonary embolism are noted. Mediastinum/Nodes: Thoracic inlet is within normal limits. No hilar or mediastinal adenopathy is noted. Scattered small mediastinal nodes are noted stable from the prior exam and likely reactive in nature. The esophagus as visualized is within normal limits. Lungs/Pleura: Lungs are well aerated bilaterally. Mild emphysematous changes are noted. No focal infiltrate or effusion is seen. No parenchymal nodule is noted. Upper Abdomen: Visualized upper abdomen is unremarkable. Gallbladder has been surgically removed. Musculoskeletal: Degenerative changes of the thoracic spine are noted. Review of the MIP images confirms the above findings. IMPRESSION: No evidence of pulmonary emboli. No acute abnormality is seen. Aortic Atherosclerosis (ICD10-I70.0) and Emphysema (ICD10-J43.9). Electronically Signed   By: Oneil Devonshire M.D.   On: 10/19/2023 20:16   CT Head Wo Contrast Result Date: 10/19/2023 CLINICAL DATA:  Increasing headaches EXAM: CT HEAD WITHOUT CONTRAST TECHNIQUE: Contiguous axial images were obtained from the base of the skull through the vertex without intravenous contrast. RADIATION DOSE REDUCTION: This exam was performed according to the departmental dose-optimization program which includes automated exposure control, adjustment of the mA and/or kV according to patient size and/or use of iterative reconstruction technique. 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 No acute hemorrhage, acute infarction or space-occupying mass  lesion is seen. Vascular: Basilar arterial stent is noted. Skull: Normal. Negative for fracture or focal lesion. Sinuses/Orbits: No acute finding. Other: None. IMPRESSION: No acute intracranial abnormality noted. Chronic changes as previously described. Electronically Signed   By: Oneil Devonshire M.D.   On: 10/19/2023 20:12    SIGNED: Adriana DELENA Grams, MD, FHM. FAAFP. Jol[facility] - [facility] Time spent - 55 min.  In seeing, evaluating and examining the patient. Reviewing medical records, labs, drawn plan of care. [facility],  Pager (please use [email or web address] to page/  text) Please use Epic Secure Chat for non-urgent communication (7AM-7PM)  If 7PM-7AM, please contact night-coverage [email or web address], 10/20/2023, 10:03 AM

## 2023-10-20 NOTE — Consult Note (Signed)
 Hospital Consult    Reason for Consult: Asymptomatic carotid stenosis.   MRN #:  [id number]  History of Present Illness: This is a 53 y.o. male who presented to the emergency department on Friday night with vague complaints of being unable to think straight and subsequently lost consciousness in triage.  He has a history of left vertebral artery stenting that was performed 7 years ago when he was admitted for a left cerebellar stroke.  During this episode he experienced vertigo, nausea and vomiting.  He denies any one-sided weakness, numbness, amaurosis or speech issues.  A CTA was obtained which demonstrated restenosis of his left vertebral artery stent for which neuro IR is following.  We were called due to left-sided approximate 70% carotid artery stenosis.  Past Medical History:  Diagnosis Date   Asthma    Chest pain    CHF (congestive heart failure) (HCC)    CKD (chronic kidney disease) 11/13/2017   Coronary artery disease    a. s/p DES x2 to LAD and DES to OM in 11/2017   Depression    Dyspnea    GERD (gastroesophageal reflux disease)    Headache    History of kidney stones    History of rhabdomyolysis    Hyperlipidemia    Hypertension    Hypothyroidism    Ischemic cardiomyopathy    a. 11/2017: echo showing EF of 35-40%, diffuse HK, and Grade 2 DD   Kidney calculus 2014   Pericardial effusion    Small, by dobutamine echocardiogram, 04/2006   PONV (postoperative nausea and vomiting)    Sleep apnea    does not wear cpap   Stroke (HCC)    Tobacco abuse    Type 2 diabetes mellitus without complications (HCC) 10/07/2013   Vitamin D  deficiency     Past Surgical History:  Procedure Laterality Date   APPENDECTOMY     BIOPSY  12/07/2022   Procedure: BIOPSY;  Surgeon: Cinderella Deatrice FALCON, MD;  Location: [facility];  Service: Endoscopy;;   CARDIAC CATHETERIZATION  09/2012   Nonobstructive CAD with 30% proximal, 40% mid LAD disease; 30% proximal CFX; EF 55-65%    CHOLECYSTECTOMY N/A 01/27/2013   Procedure: LAPAROSCOPIC CHOLECYSTECTOMY;  Surgeon: Oneil DELENA Budge, MD;  Location: [facility];  Service: General;  Laterality: N/A;   CORONARY STENT INTERVENTION N/A 11/15/2017   Procedure: CORONARY STENT INTERVENTION;  Surgeon: Anner Alm ORN, MD;  Location: [facility];  Service: Cardiovascular;  Laterality: N/A;   ESOPHAGOGASTRODUODENOSCOPY (EGD) WITH PROPOFOL  N/A 12/07/2022   Procedure: ESOPHAGOGASTRODUODENOSCOPY (EGD) WITH PROPOFOL ;  Surgeon: Cinderella Deatrice FALCON, MD;  Location: [facility];  Service: Endoscopy;  Laterality: N/A;  9:45am;asa 3   ESOPHAGOGASTRODUODENOSCOPY ENDOSCOPY  11/2022   HERNIA REPAIR     As a child   INSERTION OF MESH N/A 11/13/2012   Procedure: INSERTION OF MESH;  Surgeon: Oneil DELENA Budge, MD;  Location: [facility];  Service: General;  Laterality: N/A;   IR ANGIO INTRA EXTRACRAN SEL COM CAROTID INNOMINATE BILAT MOD SED  12/01/2017   IR ANGIO INTRA EXTRACRAN SEL COM CAROTID INNOMINATE BILAT MOD SED  02/13/2018   IR ANGIO INTRA EXTRACRAN SEL COM CAROTID INNOMINATE BILAT MOD SED  09/15/2022   IR ANGIO VERTEBRAL SEL SUBCLAVIAN INNOMINATE BILAT MOD SED  09/15/2022   IR ANGIO VERTEBRAL SEL SUBCLAVIAN INNOMINATE UNI L MOD SED  02/13/2018   IR ANGIO VERTEBRAL SEL VERTEBRAL BILAT MOD SED  12/01/2017   IR ANGIO VERTEBRAL SEL VERTEBRAL UNI R MOD  SED  02/13/2018   IR INTRA CRAN STENT  12/03/2017   IR PTA INTRACRANIAL  02/25/2018   IR RADIOLOGIST EVAL & MGMT  07/29/2021   IR US  GUIDE VASC ACCESS RIGHT  02/13/2018   KIDNEY STONE SURGERY     LEFT HEART CATH AND CORONARY ANGIOGRAPHY N/A 11/15/2017   Procedure: LEFT HEART CATH AND CORONARY ANGIOGRAPHY;  Surgeon: Anner Alm ORN, MD;  Location: [facility] INVASIVE CV LAB;  Service: Cardiovascular;  Laterality: N/A;   PERCUTANEOUS NEPHROLITHOTRIPSY     RADIOLOGY WITH ANESTHESIA Left 12/03/2017   Procedure: Angioplasty with possible stenting of left VBJ;  Surgeon: Dolphus Carrion, MD;  Location: [facility] OR;   Service: Radiology;  Laterality: Left;   RADIOLOGY WITH ANESTHESIA N/A 02/25/2018   Procedure: STENT PLACEMENT;  Surgeon: Dolphus Carrion, MD;  Location: [facility] OR;  Service: Radiology;  Laterality: N/A;   UMBILICAL HERNIA REPAIR N/A 11/13/2012   Procedure: UMBILICAL HERNIORRHAPHY;  Surgeon: Oneil DELENA Budge, MD;  Location: [facility];  Service: General;  Laterality: N/A;   VARICOCELECTOMY      Allergies  Allergen Reactions   Crestor [Rosuvastatin] Other (See Comments)    rhabdomyolosis   Trulicity [Dulaglutide] Nausea And Vomiting   Bee Venom Swelling    SWELLING REACTION UNSPECIFIED    Morphine      Panic attack    Prior to Admission medications   Medication Sig Start Date End Date Taking? Authorizing Provider  aspirin  81 MG EC tablet Take 1 tablet (81 mg total) by mouth daily. With food 12/05/17  Yes Emokpae, Courage, MD  clopidogrel  (PLAVIX ) 75 MG tablet Take 1 tablet (75 mg total) by mouth daily. 10/15/19  Yes Johnson, Clanford L, MD  empagliflozin  (JARDIANCE ) 10 MG TABS tablet TAKE 1 TABLET BY MOUTH DAILY BEFORE BREAKFAST 05/28/23  Yes Nida, Gebreselassie W, MD  Evolocumab  (REPATHA  SURECLICK) 140 MG/ML SOAJ Inject 1 pen into the skin every 14 (fourteen) days. 12/24/19  Yes BranchDorn FALCON, MD  ezetimibe  (ZETIA ) 10 MG tablet Take 1 tablet (10 mg total) by mouth daily. 10/15/19  Yes Johnson, Clanford L, MD  famotidine  (PEPCID ) 40 MG tablet Take 40 mg by mouth daily. 11/23/20  Yes [provider]  fluticasone  (FLONASE ) 50 MCG/ACT nasal spray Place 2 sprays into both nostrils 2 (two) times daily. 06/24/20  Yes [provider]  furosemide  (LASIX ) 20 MG tablet Take 1 tablet (20 mg total) by mouth daily as needed. 10/19/21  Yes Branch, Dorn FALCON, MD  gabapentin  (NEURONTIN ) 300 MG capsule One po q AM, one po q PM and 2 po qHS Patient taking differently: Take 300 mg by mouth in the morning. 12/23/19  Yes Sater, Charlie DELENA, MD  gemfibrozil  (LOPID ) 600 MG tablet Take 600 mg by mouth 2  (two) times daily. 10/09/23  Yes [provider]  icosapent  Ethyl (VASCEPA ) 1 g capsule Take 2 capsules by mouth twice daily 02/06/22  Yes Branch, Dorn FALCON, MD  LANTUS  SOLOSTAR 100 UNIT/ML Solostar Pen Inject 70 Units into the skin at bedtime. 01/17/23  Yes [provider]  levothyroxine  (SYNTHROID ) 125 MCG tablet Take 250 mcg by mouth in the morning. 05/13/20  Yes [provider]  levothyroxine  (SYNTHROID ) 125 MCG tablet Take 125 mcg by mouth daily before breakfast. 01/25/21  Yes [provider]  nitroGLYCERIN  (NITROSTAT ) 0.4 MG SL tablet Place 1 tablet (0.4 mg total) under the tongue every 5 (five) minutes as needed for chest pain. 02/14/21  Yes Alvan Dorn FALCON, MD  NOVOLOG  FLEXPEN 100 UNIT/ML FlexPen  Inject 2 Units into the skin 3 (three) times daily as needed for high blood sugar. 01/17/23  Yes [provider]  pantoprazole  (PROTONIX ) 40 MG tablet TAKE 1 TABLET BY MOUTH DAILY 08/28/23  Yes Ahmed, Muhammad F, MD  pioglitazone (ACTOS) 15 MG tablet Take 15 mg by mouth daily. 10/09/23  Yes [provider]  Tiotropium Bromide-Olodaterol (STIOLTO RESPIMAT ) 2.5-2.5 MCG/ACT AERS Inhale 2 puffs into the lungs daily. Patient taking differently: Inhale 2 puffs into the lungs in the morning. 10/03/23  Yes [name], MD  VENTOLIN  HFA 108 (90 Base) MCG/ACT inhaler Inhale 1-2 puffs into the lungs every 6 (six) hours as needed for wheezing or shortness of breath. 11/23/20  Yes [provider]  Continuous Blood Gluc Sensor (FREESTYLE LIBRE 2 SENSOR) MISC APPLY 1 PATCH TO SKIN ONCE EVERY 14 DAYS 03/27/22   Nida, Ethelle ORN, MD    Social History   Socioeconomic History   Marital status: Divorced    Spouse name: Not on file   Number of children: Not on file   Years of education: Not on file   Highest education level: Not on file  Occupational History   Occupation: Disability     Comment:      Employer: [employer]  Tobacco Use   Smoking  status: Every Day    Current packs/day: 1.00    Average packs/day: 1 pack/day for 38.6 years (38.6 ttl pk-yrs)    Types: Cigarettes    Start date: 03/13/1985    Passive exposure: Never   Smokeless tobacco: Former    Types: Snuff, Chew    Quit date: 03/14/1987  Vaping Use   Vaping status: Former   Devices: quit in 2011  Substance and Sexual Activity   Alcohol use: No   Drug use: No   Sexual activity: Not on file  Other Topics Concern   Not on file  Social History Narrative   Eats fast food daily   No exercise outside of work   Lives in [place] with his dog   MAINTENANCE TECHNICIAN   Caffeine use: tea/soda daily   Left handed    Social Drivers of Health   Financial Resource Strain: Not on file  Food Insecurity: Not on file  Transportation Needs: No Transportation Needs (09/07/2020)   Received from [facility]   PRAPARE - Transportation    In the past 12 months, has lack of transportation kept you from medical appointments or from getting medications?: No    Lack of Transportation (Non-Medical): No  Physical Activity: Not on file  Stress: Not on file  Social Connections: Unknown (01/06/2022)   Received from [facility]   Social Network    Social Network: Not on file  Intimate Partner Violence: Unknown (01/06/2022)   Received from [facility]   HITS    Physically Hurt: Not on file    Insult or Talk Down To: Not on file    Threaten Physical Harm: Not on file    Scream or Curse: Not on file    Family History  Problem Relation Age of Onset   Coronary artery disease Father    Hypertension Father    Hyperlipidemia Father    Diabetes Father    Congestive Heart Failure Father 19   Arrhythmia Father        had an ICD   Diabetes Mother    Hypertension Mother    Hyperlipidemia Mother    Obesity Mother 37  died after bariatric surgery, liver failure and infection   Coronary artery disease Maternal Grandfather        both grandfathers and several  uncles   Coronary artery disease Other    Diabetes Sister        both sisters   Stroke Maternal Aunt    Stroke Maternal Uncle     ROS: Otherwise negative unless mentioned in HPI  Physical Examination  Vitals:   10/20/23 1210 10/20/23 1626  BP: 124/79 (!) 129/91  Pulse: 73 74  Resp: 16 12  Temp: 97.9 F (36.6 C) 97.6 F (36.4 C)  SpO2: 97% 99%   Body mass index is 27.97 kg/m.  General: no acute distress Cardiac: hemodynamically stable Pulm: normal work of breathing Abdomen: non-tender, no pulsatile mass  Neuro: alert, no focal deficit, 5 out of 5 strength in all 4 extremities Extremities: No edema or cyanosis Vascular:   Right: Palpable radial, DP  Left: Palpable radial, DP   Data:   CTA independently reviewed Approximate 70% stenosis of the left carotid bulb and proximal ICA. Some atherosclerotic disease involving the right carotid bulb although no significant stenosis.    ASSESSMENT/PLAN: This is a 53 y.o. male with asymptomatic carotid artery stenosis.  Left approximately 70%.  By duplex ultrasound this was measured at 40 to 59% 3 months ago in May.  I explained the threshold for carotid intervention in asymptomatic patients being 80% and at this time will continue with medical management and surveillance. Plan for follow-up in about 6 months with a repeat carotid duplex.   Norman GORMAN Serve MD [facility] [phone] 10/20/2023  6:18 PM

## 2023-10-20 NOTE — Hospital Course (Signed)
 Devin [name] is a 53 y.o. male with a history of vertebral artery stenting, CAD s/p stents 2019, HTN, T2DM, HLD, tobacco use, hypothyroidism who presented to the ED with vague complaints of can't think straight, subsequently lost consciousness in triage. History is limited as pt does not recall much of the last several hours and no family at bedside at this time. He denies current and recent chest pain or dyspnea or palpitations. He denies numbness or weakness in a particular area. Can't tell me whether he had symptoms leading up to passing out. Doesn't think he's been dehydrated or otherwise unwell prior.    In the ED workup revealed no acute stroke, no emergent LVO, though there is focal filling defect in distal V4 segment on left, severe stenosis at that level, and 70% left carotid bulb stenosis. Tele neurology was consulted, recommending MRI brain, neurosurgery, vascular, and cardiology consultations. Patient has returned to baseline and will be admitted to [facility].

## 2023-10-20 NOTE — Consult Note (Signed)
 [facility] Tele[facility]urology Consult Services  Stat Consult  Patient Name:   Devin [name], Devin [name] Date of Birth:   Feb 09, 1971 Identification Number:   MRN - [id number] Date of Service:   10/19/2023 23:51:06  Diagnosis:       I63.213 - Cerebrovascular accident (CVA) due to bilateral stenosis of vertebral arteries  Impression Syncope in the setting of dizziness, SOB, hypotension. With known b/l vertebral artery stenosis s/p L. stenting . Now at baseline.  DDx includes vertebrobasilar insufficiency vs. cardiogenic syncope.  Plan: NSx consultation for intracranial atherosclerotic disease. Vascular surgery consultation for extracranial vascular disease. Cardiology consultation for cardiogenic syncope evaluation.  MRI Brain w/o -- If positive for new ischemia, then consider alternative DAPT, and full stroke w/u including TTE [w/ agitated saline study to evaluate for PFO]  DAPT as below Serum P2y12. HD statin  General comprehensive review of Infectious/metabolic/toxic provokers Aggressive vascular risk factor management; titrate A1c<7, LDL<70. -- including regular aerobic exercise, Mediterranean diet, smoking/vaping cessation  Permissive HTN as below Tele, Holter on D/C PT/OT/SLP; and for dispo  Further below.    Recommendations: Our recommendations are outlined below.  Antithrombotic Medication : Bolus with Clopidogrel  300 mg bolus x1 and initiate dual antiplatelet therapy with Aspirin  81 mg daily and Clopidogrel  75 mg daily Permissive hypertension, Antihypertensives with prn for first 24-48 hrs post stroke onset. If BP greater than 220/120 give Labetalol  IV or Vasotec IV  Nursing Recommendations : IV Fluids, avoid dextrose  containing fluids, Maintain euglycemia Neuro checks q4 hrs x 24 hrs and then per shift Head of bed 30 degrees Continue with Telemetry  Consultations : Recommend Speech therapy if failed dysphagia screen Physical therapy/Occupational  therapy  DVT Prophylaxis : Choice of Primary Team  Disposition : Neurology will follow Outpatient Neurology follow up in 1-3 weeks    ----------------------------------------------------------------------------------------------------    Metrics: Dispatch Time: 10/19/2023 23:49:40 Callback Response Time: 10/19/2023 23:52:05  Primary Provider Notified of Diagnostic Impression and Management Plan on: 10/20/2023 01:59:36   CT HEAD: CT head unremarkable for acute infarction or hemorrhage per Radiology: CT head unremarkable for acute infarction or hemorrhage per Radiology   Imaging CTA H IMPRESSION: No acute intracranial abnormality noted. Chronic changes as previously described.  CTA H/N 10/19/23 IMPRESSION: 1. Vascular stent extending from the left V4 segment into the distal basilar artery. Persistent focal filling defect within the distal left V4 segment just distal to the proximal landing zone of the stent, similar to prior, and suspicious for chronic thrombus or possibly a small chronic dissection. Resultant severe stenosis at this level. Overall, appearance is relatively similar as compared to prior CTA from 03/08/2023. 2. Moderate mixed plaque about the left carotid bulb with associated stenosis of up to 70% by NASCET criteria. 3. Atheromatous change about the origins of the vertebral arteries with associated severe left and moderate right ostial stenoses. 4. Aortic Atherosclerosis (ICD10-I70.0).  CTA H/N 10/15/19 IMPRESSION: 1. No emergent large vessel occlusion. 2. Patent stent extending from the left vertebral artery V4 segment to the distal basilar artery. 3. 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 4. Aortic Atherosclerosis (ICD10-I70.0).    ----------------------------------------------------------------------------------------------------  Chief Complaint: Syncope.  History of Present Illness: Patient is a 53 year old Male. 53yo M with  PMH of vertebral basilar insufficiency with stent placement.  Presented due to sudden onset dizziness and SOB. He notes episode were provoked by standing from a seated position. Patient presented to the ED. In the ED, patient experienced several syncopal episodes. No focal deficits appreciated.  Patient seen at bedside. At this time he notes he is at baseline. Continues to be no focal deficits. There is mild stocking-glove paresthesias in both hands and feet.  All questions answered.  CT head Negative. CTA head/neck: high grade stenosis proximal to stent. .  Now back to baseline.    Past Medical History:      Diabetes Mellitus      Stroke  Medications:  No Anticoagulant use  Antiplatelet use: Yes ASA + Plavix  Reviewed EMR for current medications  Allergies:  Reviewed  Social History: Alcohol Use: No  Family History:  There is no family history of premature cerebrovascular disease pertinent to this consultation  ROS : 14 Points Review of Systems was performed and was negative except mentioned in HPI.  Past Surgical History: There Is No Surgical History Contributory To Today's Visit   Examination: BP(93/71), Pulse(85), 1A: Level of Consciousness - Alert; keenly responsive + 0 1B: Ask Month and Age - Both Questions Right + 0 1C: Blink Eyes & Squeeze Hands - Performs Both Tasks + 0 2: Test Horizontal Extraocular Movements - Normal + 0 3: Test Visual Fields - No Visual Loss + 0 4: Test Facial Palsy (Use Grimace if Obtunded) - Normal symmetry + 0 5A: Test Left Arm Motor Drift - No Drift for 10 Seconds + 0 5B: Test Right Arm Motor Drift - No Drift for 10 Seconds + 0 6A: Test Left Leg Motor Drift - No Drift for 5 Seconds + 0 6B: Test Right Leg Motor Drift - No Drift for 5 Seconds + 0 7: Test Limb Ataxia (FNF/Heel-Shin) - No Ataxia + 0 8: Test Sensation - Normal; No sensory loss + 0 9: Test Language/Aphasia - Normal; No aphasia + 0 10: Test Dysarthria - Normal + 0 11:  Test Extinction/Inattention - No abnormality + 0  NIHSS Score: 0  Spoke with : ED MD via EPIC.    This consult was conducted in real time using interactive audio and Immunologist. Patient was informed of the technology being used for this visit and agreed to proceed. Patient located in hospital and provider located at home/office setting.  Patient is being evaluated for possible acute neurologic impairment and high probability of imminent or life - threatening deterioration.I spent total of 35 minutes providing care to this patient, including time for face to face visit via telemedicine, review of medical records, imaging studies and discussion of findings with providers, the patient and / or family.   Dr Crystal Pour     [facility] For Inpatient follow-up with [facility] physician please call [facility] at [phone]. As we are not an outpatient service for any post hospital discharge needs please contact the hospital for assistance.  If you have any questions for the [facility] physicians or need to reconsult for clinical or diagnostic changes please contact us  via [facility] at [phone].   Signature : Abhishek Dharma Pare

## 2023-10-20 NOTE — ED Notes (Signed)
 Family and pt stated, he just ate 3 chicken sandwiches. This nurse did not inform pt nor family he could eat.

## 2023-10-21 ENCOUNTER — Other Ambulatory Visit (HOSPITAL_COMMUNITY)

## 2023-10-21 ENCOUNTER — Observation Stay (HOSPITAL_BASED_OUTPATIENT_CLINIC_OR_DEPARTMENT_OTHER)

## 2023-10-21 ENCOUNTER — Observation Stay (HOSPITAL_COMMUNITY)

## 2023-10-21 DIAGNOSIS — R42 Dizziness and giddiness: Secondary | ICD-10-CM

## 2023-10-21 DIAGNOSIS — R55 Syncope and collapse: Secondary | ICD-10-CM

## 2023-10-21 DIAGNOSIS — I651 Occlusion and stenosis of basilar artery: Secondary | ICD-10-CM | POA: Diagnosis not present

## 2023-10-21 DIAGNOSIS — I6509 Occlusion and stenosis of unspecified vertebral artery: Secondary | ICD-10-CM | POA: Diagnosis not present

## 2023-10-21 LAB — CBC
HCT: 46.6 % (ref 39.0–52.0)
Hemoglobin: 14.8 g/dL (ref 13.0–17.0)
MCH: 27.2 pg (ref 26.0–34.0)
MCHC: 31.8 g/dL (ref 30.0–36.0)
MCV: 85.7 fL (ref 80.0–100.0)
Platelets: 274 K/uL (ref 150–400)
RBC: 5.44 MIL/uL (ref 4.22–5.81)
RDW: 15.3 % (ref 11.5–15.5)
WBC: 6.7 K/uL (ref 4.0–10.5)
nRBC: 0 % (ref 0.0–0.2)

## 2023-10-21 LAB — BASIC METABOLIC PANEL WITH GFR
Anion gap: 10 (ref 5–15)
BUN: 15 mg/dL (ref 6–20)
CO2: 23 mmol/L (ref 22–32)
Calcium: 9.2 mg/dL (ref 8.9–10.3)
Chloride: 106 mmol/L (ref 98–111)
Creatinine, Ser: 1.12 mg/dL (ref 0.61–1.24)
GFR, Estimated: >60 mL/min (ref >60)
Glucose, Bld: 334 mg/dL — ABNORMAL HIGH (ref 70–99)
Potassium: 3.8 mmol/L (ref 3.5–5.1)
Sodium: 139 mmol/L (ref 135–145)

## 2023-10-21 LAB — ECHOCARDIOGRAM COMPLETE
AR max vel: 2.39 cm2
AV Area VTI: 2.52 cm2
AV Area mean vel: 2.41 cm2
AV Mean grad: 5 mmHg
AV Peak grad: 9.1 mmHg
Ao pk vel: 1.51 m/s
Area-P 1/2: 3.53 cm2
Calc EF: 59.3 %
Height: 77 in
S' Lateral: 3.9 cm
Single Plane A2C EF: 58.2 %
Single Plane A4C EF: 58.7 %
Weight: 3774.28 [oz_av]

## 2023-10-21 LAB — GLUCOSE, CAPILLARY
Glucose-Capillary: 299 mg/dL — ABNORMAL HIGH (ref 70–99)
Glucose-Capillary: 190 mg/dL — ABNORMAL HIGH (ref 70–99)

## 2023-10-21 MED ORDER — LEVOTHYROXINE SODIUM 125 MCG PO TABS: 125.0000 ug | ORAL_TABLET | Freq: Every day | ORAL | 0 refills | Status: AC

## 2023-10-21 MED ORDER — LEVOTHYROXINE SODIUM 25 MCG PO TABS: 125.0000 ug | ORAL_TABLET | Freq: Every day | ORAL | Status: DC

## 2023-10-21 MED ORDER — INSULIN GLARGINE-YFGN 100 UNIT/ML ~~LOC~~ SOLN
15.0000 [IU] | Freq: Every day | SUBCUTANEOUS | Status: DC
  Administered 2023-10-21: 15 [IU] via SUBCUTANEOUS
  Filled 2023-10-21: qty 0.15

## 2023-10-21 NOTE — Progress Notes (Signed)
Orders received to discharge patient.  Telemetry monitor removed and CCMD notified.  PIV access removed.  Discharge instructions, follow up, medications and instructions for their use discussed with patient. 

## 2023-10-21 NOTE — Discharge Summary (Signed)
 Physician Discharge Summary   Patient: Devin [name] MRN: [id number] DOB: February 06, 1971  Admit date:     10/19/2023  Discharge date: 10/21/23  Discharge Physician: Reyes VEAR Gaw   PCP: Trudy Vaughn FALCON, MD   Recommendations at discharge:   Patient was discharged home with recommendations to follow-up with PCP in the next 1 to 2 weeks.  Also recommended to follow-up with endocrinology in the next 2 weeks to have TSH rechecked. Also recommended to follow-up with Dr. Nancyann Burns neuro interventional radiologist later this week, nurse plans to reach out to schedule this appointment.     Discharge Diagnoses: Principal Problem:   Vertebrobasilar artery stenosis Active Problems:   Essential hypertension, benign   Mixed hyperlipidemia   Hypothyroidism   DM type 2 causing vascular disease (HCC)   Coronary artery disease   History of stroke   Basilar artery stenosis, symptomatic, without infarction   COPD GOLD 2 / still smoking  Resolved Problems:   * No resolved hospital problems. *  Hospital Course: 53 y.o. male with a history of vertebral artery stenting, CAD s/p stents 2019, HTN, T2DM, HLD, tobacco use, hypothyroidism who presented to the ED with vague complaints of can't think straight, subsequently lost consciousness in triage.  Syncopal episode with nausea and vomiting in ER triage.  Also several episodes while in the emergency department.    Of note patient reports that he has had these episodes sporadically over the past year.  Often confused afterwards.  Always while seated or lying in bed and has never actually fallen because of this.    Transfer to [facility] as he had ED workup revealed no acute stroke, no emergent LVO, though there is focal filling defect in distal V4 segment on left, severe stenosis at that level, and 70% left carotid bulb stenosis. Tele neurology was consulted, recommending MRI brain, neurosurgery, vascular consultations.  Interventional neuroradiology  saw patient and recommended medical management and no treatment/intervention needed for intracranial left vertebral artery stent restenosis.  Also did not think this was the cause of his symptoms and that the procedure entailed risk with limited benefit.  Vascular surgery saw patient and recommended medical therapy as well.  Of note his TSH did return 0.275.  We held his Synthroid  on discharge.  He reports going 24 hours without his Synthroid  was the best he has felt in quite some times.  He has reported history of daily headache for the past year if not longer and he has had no headache.  No further palpitations.  No further episodes.  Echo also done in house which showed normal EF and no structural abnormalities/no overt contribution to vertiginous/syncopal episodes.  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 twice daily to 125 mg daily with recommendations for close follow-up with endocrinology outpatient.     Assessment & Plan: Syncope:  -Recurrent syncopal episodes over past year, including witnessed in healthcare settings.   -Previous cardiac workup including Holter monitor with cardiology -Possibly related to vertebral artery V4 stenosis with stent-70% left carotid stenosis - Echo ordered and negative as above.   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  - Admit to [facility] for neuroIR (s/p vertebral artery stenting x2 2019 with Dr. Dolphus). This was recommended by his neurologist last month as well-->plan will be outpatient medical management per Dr. Burns.  - Pt reports majority of symptoms occur while he is seated or  at rest rather than being active none since admission.   Left carotid stenosis - Vascular surgery was consulted for the same. - Recommended medical management and outpatient follow-up.   CAD, HLD:  - On extended DAPT, continue - Statin  intolerance noted, check LDL, on repatha  as outpatient, continue zetia , fenofibrate  once confirmed by med rec.  - Repeat lipid panel notably elevated here-->wonder how often he is taking repatha  outpatient as we would expect lower LDL on this.  That said, his trigclycerides were 730 in 2023 and 741 in 2022, and much lower at 215 now.     Diabetes mellitus type II - Monitoring CBG q. ACHS, SSI coverage.  A1C pending.  - Hold home SGLT2i, januvia  pending med rec - reports CBGs have been normal during these episodes.   - reportedly on 70 units lantus  at home.  Starting semglee  20 units here at bedtime and watch CBGs.    Hypothyroidism:  - TSH low at 0.275.  He is on very high dose Synthroid  at home. - will lower his synthroid  dosage to 125 mcg at discharge as above.   Tobacco use:  - Cessation counseling provided - Declined nicotine  patch        Consultants: Neurointerventional radiology, vascular surgery Disposition: Home Diet recommendation:  Discharge Diet Orders (From admission, onward)     Start     Ordered   10/21/23 0000  Diet - low sodium heart healthy        10/21/23 1632           Heart healthy diet DISCHARGE MEDICATION: Allergies as of 10/21/2023       Reactions   Crestor [rosuvastatin] Other (See Comments)   rhabdomyolosis   Trulicity [dulaglutide] Nausea And Vomiting   Bee Venom Swelling   SWELLING REACTION UNSPECIFIED    Morphine     Panic attack        Medication List     TAKE these medications    aspirin  EC 81 MG tablet Take 1 tablet (81 mg total) by mouth daily. With food   clopidogrel  75 MG tablet Commonly known as: PLAVIX  Take 1 tablet (75 mg total) by mouth daily.   ezetimibe  10 MG tablet Commonly known as: ZETIA  Take 1 tablet (10 mg total) by mouth daily.   famotidine  40 MG tablet Commonly known as: PEPCID  Take 40 mg by mouth daily.   fluticasone  50 MCG/ACT nasal spray Commonly known as: FLONASE  Place 2 sprays into both  nostrils 2 (two) times daily.   FreeStyle Libre 2 Sensor Misc APPLY 1 PATCH TO SKIN ONCE EVERY 14 DAYS   furosemide  20 MG tablet Commonly known as: LASIX  Take 1 tablet (20 mg total) by mouth daily as needed.   gabapentin  300 MG capsule Commonly known as: NEURONTIN  One po q AM, one po q PM and 2 po qHS What changed:  how much to take how to take this when to take this additional instructions   gemfibrozil  600 MG tablet Commonly known as: LOPID  Take 600 mg by mouth 2 (two) times daily.   icosapent  Ethyl 1 g capsule Commonly known as: VASCEPA  Take 2 capsules by mouth twice daily   Jardiance  10 MG Tabs tablet Generic drug: empagliflozin  TAKE 1 TABLET BY MOUTH DAILY BEFORE BREAKFAST   Lantus  SoloStar 100 UNIT/ML Solostar Pen Generic drug: insulin  glargine Inject 70 Units into the skin at bedtime.   levothyroxine  125 MCG tablet Commonly known as: SYNTHROID  Take 1 tablet (125 mcg total) by mouth daily at  6 (six) AM. Start taking on: October 22, 2023 What changed:  how much to take when to take this Another medication with the same name was removed. Continue taking this medication, and follow the directions you see here.   nitroGLYCERIN  0.4 MG SL tablet Commonly known as: NITROSTAT  Place 1 tablet (0.4 mg total) under the tongue every 5 (five) minutes as needed for chest pain.   NovoLOG  FlexPen 100 UNIT/ML FlexPen Generic drug: insulin  aspart Inject 2 Units into the skin 3 (three) times daily as needed for high blood sugar.   pantoprazole  40 MG tablet Commonly known as: PROTONIX  TAKE 1 TABLET BY MOUTH DAILY   pioglitazone 15 MG tablet Commonly known as: ACTOS Take 15 mg by mouth daily.   Repatha  SureClick 140 MG/ML Soaj Generic drug: Evolocumab  Inject 1 pen into the skin every 14 (fourteen) days.   Stiolto Respimat  2.5-2.5 MCG/ACT Aers Generic drug: Tiotropium Bromide-Olodaterol Inhale 2 puffs into the lungs daily. What changed: when to take this   Ventolin   HFA 108 (90 Base) MCG/ACT inhaler Generic drug: albuterol  Inhale 1-2 puffs into the lungs every 6 (six) hours as needed for wheezing or shortness of breath.        Discharge Exam: Filed Weights   10/19/23 1911  Weight: 107 kg   Gen: 53 y.o. male in no apparent distress.  Nontoxic Pulm: Non-labored breathing.  Clear to auscultation bilaterally.  CV: Regular rate and rhythm. No murmur, rub, or gallop. No JVD GI: Abdomen soft, non-tender, non-distended, with normoactive bowel sounds. No organomegaly or masses felt. Ext: Warm, no deformities,  Skin: No rashes, lesions ulcers Neuro: Alert and oriented. No focal neurological deficits. Psych: Calm  Judgement and insight appear normal. Mood & affect appropriate.  Condition at discharge: good  The results of significant diagnostics from this hospitalization (including imaging, microbiology, ancillary and laboratory) are listed below for reference.   Imaging Studies: ECHOCARDIOGRAM COMPLETE Result Date: 10/21/2023    ECHOCARDIOGRAM REPORT   Patient Name:   HUMZA TALLERICO Date of Exam: 10/21/2023 Medical Rec #:  [id number]      Height:       77.0 in Accession #:    [id number]     Weight:       235.9 lb Date of Birth:  27-Jul-1970     BSA:          2.399 m Patient Age:    52 years       BP:           127/82 mmHg Patient Gender: M              HR:           71 bpm. Exam Location:  Inpatient Procedure: 2D Echo, 3D Echo, Cardiac Doppler, Color Doppler and Strain Analysis            (Both Spectral and Color Flow Doppler were utilized during            procedure). Indications:    Syncope  History:        Patient has prior history of Echocardiogram examinations, most                 recent 02/26/2023. CAD, COPD; Risk Factors:Hypertension,                 Diabetes, Dyslipidemia and Current Smoker.  Sonographer:    Philomena Daring Referring Phys: REYES VEAR GAW IMPRESSIONS  1. Left ventricular ejection fraction, by estimation, is 55  to 60%. The left ventricle  has normal function. The left ventricle has no regional wall motion abnormalities. There is mild left ventricular hypertrophy. Left ventricular diastolic parameters were normal. The average left ventricular global longitudinal strain is -16.4 %. The global longitudinal strain is normal.  2. Right ventricular systolic function is normal. The right ventricular size is normal. Tricuspid regurgitation signal is inadequate for assessing PA pressure.  3. The mitral valve is normal in structure. Trivial mitral valve regurgitation. No evidence of mitral stenosis.  4. The aortic valve was not well visualized. Aortic valve regurgitation is not visualized. No aortic stenosis is present.  5. The inferior vena cava is normal in size with greater than 50% respiratory variability, suggesting right atrial pressure of 3 mmHg. Comparison(s): No significant change from prior study. FINDINGS  Left Ventricle: Left ventricular ejection fraction, by estimation, is 55 to 60%. The left ventricle has normal function. The left ventricle has no regional wall motion abnormalities. The average left ventricular global longitudinal strain is -16.4 %. Strain was performed and the global longitudinal strain is normal. The left ventricular internal cavity size was normal in size. There is mild left ventricular hypertrophy. Left ventricular diastolic parameters were normal. Right Ventricle: The right ventricular size is normal. No increase in right ventricular wall thickness. Right ventricular systolic function is normal. Tricuspid regurgitation signal is inadequate for assessing PA pressure. Left Atrium: Left atrial size was normal in size. Right Atrium: Right atrial size was normal in size. Pericardium: There is no evidence of pericardial effusion. Mitral Valve: The mitral valve is normal in structure. Trivial mitral valve regurgitation. No evidence of mitral valve stenosis. Tricuspid Valve: The tricuspid valve is normal in structure. Tricuspid  valve regurgitation is not demonstrated. No evidence of tricuspid stenosis. Aortic Valve: The aortic valve was not well visualized. Aortic valve regurgitation is not visualized. No aortic stenosis is present. Aortic valve mean gradient measures 5.0 mmHg. Aortic valve peak gradient measures 9.1 mmHg. Aortic valve area, by VTI measures 2.52 cm. Pulmonic Valve: The pulmonic valve was grossly normal. Pulmonic valve regurgitation is not visualized. No evidence of pulmonic stenosis. Aorta: The aortic root and ascending aorta are structurally normal, with no evidence of dilitation. Venous: The inferior vena cava is normal in size with greater than 50% respiratory variability, suggesting right atrial pressure of 3 mmHg. IAS/Shunts: No atrial level shunt detected by color flow Doppler. Additional Comments: 3D was performed not requiring image post processing on an independent workstation and was indeterminate.  LEFT VENTRICLE PLAX 2D LVIDd:         5.60 cm      Diastology LVIDs:         3.90 cm      LV e' medial:    11.30 cm/s LV PW:         1.10 cm      LV E/e' medial:  9.9 LV IVS:        1.20 cm      LV e' lateral:   10.70 cm/s LVOT diam:     2.00 cm      LV E/e' lateral: 10.5 LV SV:         76 LV SV Index:   32           2D Longitudinal Strain LVOT Area:     3.14 cm     2D Strain GLS Avg:     -16.4 %  LV Volumes (MOD) LV vol d, MOD A2C: 92.2 ml  LV vol d, MOD A4C: 126.0 ml LV vol s, MOD A2C: 38.5 ml LV vol s, MOD A4C: 52.1 ml LV SV MOD A2C:     53.7 ml LV SV MOD A4C:     126.0 ml LV SV MOD BP:      64.8 ml RIGHT VENTRICLE             IVC RV S prime:     12.30 cm/s  IVC diam: 1.70 cm TAPSE (M-mode): 1.8 cm LEFT ATRIUM             Index        RIGHT ATRIUM           Index LA diam:        3.80 cm 1.58 cm/m   RA Area:     11.50 cm LA Vol (A2C):   41.1 ml 17.13 ml/m  RA Volume:   26.40 ml  11.00 ml/m LA Vol (A4C):   47.4 ml 19.76 ml/m LA Biplane Vol: 44.1 ml 18.38 ml/m  AORTIC VALVE AV Area (Vmax):    2.39 cm AV Area  (Vmean):   2.41 cm AV Area (VTI):     2.52 cm AV Vmax:           151.00 cm/s AV Vmean:          99.800 cm/s AV VTI:            0.300 m AV Peak Grad:      9.1 mmHg AV Mean Grad:      5.0 mmHg LVOT Vmax:         115.00 cm/s LVOT Vmean:        76.500 cm/s LVOT VTI:          0.241 m LVOT/AV VTI ratio: 0.80  AORTA Ao Root diam: 2.20 cm Ao Asc diam:  3.00 cm MITRAL VALVE MV Area (PHT): 3.53 cm     SHUNTS MV Decel Time: 215 msec     Systemic VTI:  0.24 m MV E velocity: 112.00 cm/s  Systemic Diam: 2.00 cm MV A velocity: 106.00 cm/s MV E/A ratio:  1.06 Vishnu Priya Mallipeddi Electronically signed by Diannah Late Mallipeddi Signature Date/Time: 10/21/2023/3:39:06 PM    Final    VAS US  CAROTID Result Date: 10/21/2023 Carotid Arterial Duplex Study Patient Name:  ANTOINNE SPADACCINI  Date of Exam:   10/21/2023 Medical Rec #: [id number]       Accession #:    [id number] Date of Birth: 1970-11-25      Patient Gender: M Patient Age:   35 years Exam Location:  [facility] Procedure:      VAS US  CAROTID Referring Phys: NANCYANN BURNS --------------------------------------------------------------------------------  Indications:       Syncope and Dizziness. Restenosis of left vertebrobasilar                    artery stent. Risk Factors:      Hypertension, hyperlipidemia, Diabetes, current smoker,                    coronary artery disease, prior CVA, PAD Carotid artery                    disease. Other Factors:     Vertebrobasilar stent 2019. Limitations        Today's exam was limited due to [place]. Comparison Study:  Prior carotid duplex done in [facility] 07/19/23 indicated 1-39%  right ICA stenosis and 40-59% left ICA stenosis. Performing Technologist: Alberta Lis RVS  Examination Guidelines: A complete evaluation includes B-mode imaging, spectral Doppler, color Doppler, and power Doppler as needed of all accessible portions of each vessel. Bilateral testing is considered an integral part of a complete examination.  Limited examinations for reoccurring indications may be performed as noted.  Right Carotid Findings: +----------+--------+--------+--------+------------------+------------------+           PSV cm/sEDV cm/sStenosisPlaque DescriptionComments           +----------+--------+--------+--------+------------------+------------------+ CCA Prox  106     22                                intimal thickening +----------+--------+--------+--------+------------------+------------------+ CCA Distal78      22              heterogenous                         +----------+--------+--------+--------+------------------+------------------+ ICA Prox  105     27      1-39%   heterogenous                         +----------+--------+--------+--------+------------------+------------------+ ICA Mid   90      27                                                   +----------+--------+--------+--------+------------------+------------------+ ICA Distal101     41                                                   +----------+--------+--------+--------+------------------+------------------+ ECA       144     25                                                   +----------+--------+--------+--------+------------------+------------------+ +----------+--------+-------+--------+-------------------+           PSV cm/sEDV cmsDescribeArm Pressure (mmHG) +----------+--------+-------+--------+-------------------+ Subclavian101                                        +----------+--------+-------+--------+-------------------+ +---------+--------+--+--------+--+ VertebralPSV cm/s39EDV cm/s13 +---------+--------+--+--------+--+  Left Carotid Findings: +----------+--------+--------+--------+-----------------+----------------------+           PSV cm/sEDV cm/sStenosisPlaque           Comments                                                 Description                              +----------+--------+--------+--------+-----------------+----------------------+ CCA Prox  90      21  intimal thickening     +----------+--------+--------+--------+-----------------+----------------------+ CCA Distal77      28                               intimal thickening     +----------+--------+--------+--------+-----------------+----------------------+ ICA Prox  484     144     80-99%  calcific                                +----------+--------+--------+--------+-----------------+----------------------+ ICA Mid   93      37                               Post stenotic waveform +----------+--------+--------+--------+-----------------+----------------------+ ICA Distal93      39                                                      +----------+--------+--------+--------+-----------------+----------------------+ ECA       570     121     >50%    calcific                                +----------+--------+--------+--------+-----------------+----------------------+ +----------+--------+--------+--------+-------------------+           PSV cm/sEDV cm/sDescribeArm Pressure (mmHG) +----------+--------+--------+--------+-------------------+ [id number]                                         +----------+--------+--------+--------+-------------------+ +---------+--------+--+--------+--+ VertebralPSV cm/s54EDV cm/s16 +---------+--------+--+--------+--+   Summary: Right Carotid: Velocities in the right ICA are consistent with a 1-39% stenosis. Left Carotid: Velocities in the left ICA are consistent with a 80-99% stenosis.               The ECA appears >50% stenosed. Stenosis noted at bulb/proximal               ICA. Vertebrals:  Bilateral vertebral arteries demonstrate antegrade flow. Subclavians: Normal flow hemodynamics were seen in bilateral subclavian              arteries. *See table(s) above for measurements and observations.      Preliminary    CT ANGIO HEAD NECK W WO CM Result Date: 10/19/2023 CLINICAL DATA:  Initial evaluation for acute neuro deficit, stroke suspected. EXAM: CT ANGIOGRAPHY HEAD AND NECK WITH AND WITHOUT CONTRAST TECHNIQUE: Multidetector CT imaging of the head and neck was performed using the standard protocol during bolus administration of intravenous contrast. Multiplanar CT image reconstructions and MIPs were obtained to evaluate the vascular anatomy. Carotid stenosis measurements (when applicable) are obtained utilizing NASCET criteria, using the distal internal carotid diameter as the denominator. RADIATION DOSE REDUCTION: This exam was performed according to the departmental dose-optimization program which includes automated exposure control, adjustment of the mA and/or kV according to patient size and/or use of iterative reconstruction technique. CONTRAST:  60mL OMNIPAQUE  IOHEXOL  350 MG/ML SOLN, 60mL OMNIPAQUE  IOHEXOL  350 MG/ML SOLN COMPARISON:  CT from earlier the same day. FINDINGS: With standard branch pattern. Aortic atherosclerosis. No high-grade stenosis about the origin the great vessels. CTA NECK FINDINGS Aortic arch:  Visualized aortic arch within normal limits for caliber Right carotid system: Right common internal carotid arteries are patent without dissection. Calcified plaque about the right carotid bulb without hemodynamically significant greater than 50% stenosis. Left carotid system: Left common and internal carotid arteries are patent without dissection. Moderate mixed plaque about the left carotid bulb with associated stenosis of up to 70% by NASCET criteria. Vertebral arteries: Both vertebral arteries arise from subclavian arteries. Atheromatous change about the origins of the vertebral arteries with associated severe left and moderate right ostial stenoses. Vertebral arteries are otherwise patent without stenosis or dissection. Skeleton: No worrisome osseous lesions. Patient is edentulous.  Moderate spondylosis at C6-7. Other neck: No other acute finding. Upper chest: No other acute finding. Review of the MIP images confirms the above findings CTA HEAD FINDINGS Anterior circulation: Evaluation of the intracranial circulation limited by timing of the contrast bolus. Both internal carotid arteries patent to the termini without significant stenosis. A1 segments patent bilaterally. Normal anterior communicating artery complex. Anterior cerebral arteries patent without significant stenosis. No M1 stenosis or occlusion. Distal MCA branches perfused and symmetric. Posterior circulation: Right V4 segment patent without stenosis. Right PICA patent. Left vertebral artery patent as it crosses into the cranial vault. Left PICA not seen. Vascular stent extending from the distal left V4 segment into distal basilar artery again seen. There is a persistent focal filling defect within the distal left V4 segment just distal to the proximal landing zone of the stent (series 6, image 145). While this could reflect artifact, the persistence of this finding a relation of prior exam suggested this likely reflects chronic thrombus or possibly a small chronic dissection. Resultant severe stenosis at this level. Left PICA not seen. Basilar otherwise patent distally without stenosis. Superior cerebellar and posterior cerebral arteries patent bilaterally. Venous sinuses: Patent allowing for timing the contrast bolus. Anatomic variants: As above.  No aneurysm. Review of the MIP images confirms the above findings IMPRESSION: 1. Vascular stent extending from the left V4 segment into the distal basilar artery. Persistent focal filling defect within the distal left V4 segment just distal to the proximal landing zone of the stent, similar to prior, and suspicious for chronic thrombus or possibly a small chronic dissection. Resultant severe stenosis at this level. Overall, appearance is relatively similar as compared to prior CTA from  03/08/2023. 2. Moderate mixed plaque about the left carotid bulb with associated stenosis of up to 70% by NASCET criteria. 3. Atheromatous change about the origins of the vertebral arteries with associated severe left and moderate right ostial stenoses. 4.  Aortic Atherosclerosis (ICD10-I70.0). Electronically Signed   By: Morene Hoard M.D.   On: 10/19/2023 20:52   CT Angio Chest PE W and/or Wo Contrast Result Date: 10/19/2023 CLINICAL DATA:  Weakness and shortness of breath, initial encounter EXAM: CT ANGIOGRAPHY CHEST WITH CONTRAST TECHNIQUE: Multidetector CT imaging of the chest was performed using the standard protocol during bolus administration of intravenous contrast. Multiplanar CT image reconstructions and MIPs were obtained to evaluate the vascular anatomy. RADIATION DOSE REDUCTION: This exam was performed according to the departmental dose-optimization program which includes automated exposure control, adjustment of the mA and/or kV according to patient size and/or use of iterative reconstruction technique. CONTRAST:  60mL OMNIPAQUE  IOHEXOL  350 MG/ML SOLN, 60mL OMNIPAQUE  IOHEXOL  350 MG/ML SOLN COMPARISON:  None Available. FINDINGS: Cardiovascular: Thoracic aorta shows a normal branching pattern. Mild atherosclerotic calcifications are seen. No dissection is identified. Heart is not significantly enlarged in size. Diffuse coronary calcifications  are seen. The pulmonary artery shows a normal branching pattern bilaterally. No definitive filling defects to suggest pulmonary embolism are noted. Mediastinum/Nodes: Thoracic inlet is within normal limits. No hilar or mediastinal adenopathy is noted. Scattered small mediastinal nodes are noted stable from the prior exam and likely reactive in nature. The esophagus as visualized is within normal limits. Lungs/Pleura: Lungs are well aerated bilaterally. Mild emphysematous changes are noted. No focal infiltrate or effusion is seen. No parenchymal nodule is  noted. Upper Abdomen: Visualized upper abdomen is unremarkable. Gallbladder has been surgically removed. Musculoskeletal: Degenerative changes of the thoracic spine are noted. Review of the MIP images confirms the above findings. IMPRESSION: No evidence of pulmonary emboli. No acute abnormality is seen. Aortic Atherosclerosis (ICD10-I70.0) and Emphysema (ICD10-J43.9). Electronically Signed   By: Oneil Devonshire M.D.   On: 10/19/2023 20:16   CT Head Wo Contrast Result Date: 10/19/2023 CLINICAL DATA:  Increasing headaches EXAM: CT HEAD WITHOUT CONTRAST TECHNIQUE: Contiguous axial images were obtained from the base of the skull through the vertex without intravenous contrast. RADIATION DOSE REDUCTION: This exam was performed according to the departmental dose-optimization program which includes automated exposure control, adjustment of the mA and/or kV according to patient size and/or use of iterative reconstruction technique. 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 No acute hemorrhage, acute infarction or space-occupying mass lesion is seen. Vascular: Basilar arterial stent is noted. Skull: Normal. Negative for fracture or focal lesion. Sinuses/Orbits: No acute finding. Other: None. IMPRESSION: No acute intracranial abnormality noted. Chronic changes as previously described. Electronically Signed   By: Oneil Devonshire M.D.   On: 10/19/2023 20:12   MR BRAIN/IAC W WO CONTRAST Result Date: 09/24/2023 CLINICAL DATA:  Vertigo EXAM: MRI HEAD WITHOUT AND WITH CONTRAST TECHNIQUE: Multiplanar, multiecho pulse sequences of the brain and surrounding structures were obtained without and with intravenous contrast. CONTRAST:  10mL GADAVIST  GADOBUTROL  1 MMOL/ML IV SOLN COMPARISON:  March 08, 2023 FINDINGS: MRI brain: The internal auditory canals are normal and symmetric. The vestibular and cochlear structures are normal and symmetric.  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 These do not have restricted diffusion or enhancement. No acute infarct. The ventricles are normal. No mass lesion. There are normal flow signals in the carotid arteries and basilar artery. No significant bone marrow signal abnormality. No significant abnormality in the paranasal sinuses or soft tissues. 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 This is unchanged from the previous MRI March 08, 2023. No other significant abnormality. Electronically Signed   By: Nancyann Burns M.D.   On: 09/24/2023 14:49    Microbiology: Results for orders placed or performed in visit on 11/07/22  Fecal leukocytes     Status: None   Collection Time: 11/08/22 12:00 PM   Specimen: Stool   ST  Result Value Ref Range Status   White Blood Cells (WBC), Stool Final report None Seen Final   Result 1 Comment  Final    Comment: No white blood cells seen.  Calprotectin, Fecal     Status: None   Collection Time: 11/08/22 12:00 PM   Specimen: Stool  Result Value Ref Range Status   Calprotectin, Fecal 64 0 - 120 ug/g Final    Comment: Concentration     Interpretation   Follow-Up < 5 - 50 ug/g  Normal           None >50 -120 ug/g     Borderline       Re-evaluate in 4-6 weeks     >120 ug/g     Abnormal         Repeat as clinically                                    indicated   C difficile Toxins A+B W/Rflx     Status: None   Collection Time: 11/08/22 12:00 PM   Specimen: Stool   ST  Result Value Ref Range Status   C difficile Toxins A+B, EIA Negative Negative Final  C difficile, Cytotoxin B     Status: None   Collection Time: 11/08/22 12:00 PM   ST  Result Value Ref Range Status   C diff Toxin B Final report  Final    Comment:                             Reference Range: Negative   Result 1 Comment  Final    Comment: Negative No  cytotoxin detected.     Labs: CBC: Recent Labs  Lab 10/19/23 1927 10/20/23 1230 10/21/23 0347  WBC 6.6 6.9 6.7  NEUTROABS 3.2  --   --   HGB 16.8 15.4 14.8  HCT 50.2 48.2 46.6  MCV 84.8 85.3 85.7  PLT 289 306 274   Basic Metabolic Panel: Recent Labs  Lab 10/19/23 1927 10/20/23 1230 10/21/23 0347  NA 137 139 139  K 3.6 3.8 3.8  CL 101 103 106  CO2 23 26 23   GLUCOSE 123* 208* 334*  BUN 16 13 15   CREATININE 1.10 1.24 1.12  CALCIUM 9.6 9.5 9.2   Liver Function Tests: Recent Labs  Lab 10/19/23 1927  AST 15  ALT 13  ALKPHOS 77  BILITOT 0.9  PROT 7.4  ALBUMIN 3.8   CBG: Recent Labs  Lab 10/19/23 1921 10/20/23 1632 10/20/23 2131 10/21/23 0621 10/21/23 1246  GLUCAP 145* 244* 179* 299* 190*    Discharge time spent: less than 30 minutes.  Signed: Reyes VEAR Gaw, MD [facility] 10/21/2023

## 2023-10-21 NOTE — Progress Notes (Signed)
 VASCULAR LAB    Carotid duplex has been performed.  See CV proc for preliminary results.   Paizlie Klaus, RVT 10/21/2023, 11:05 AM

## 2023-10-21 NOTE — TOC Transition Note (Signed)
 Transition of Care [facility]) - Discharge Note   Patient Details  Name: Devin [name] MRN: [id number] Date of Birth: Jan 28, 1971  [facility] CM/SW Contact:  Robynn Eileen Hoose, RN Phone Number: 10/21/2023, 4:51 PM   Clinical Narrative:    Patient is being discharged today. Outpatient PT recommended, referral # [id number], placed for [facility] re[facility]. Contact information placed on AVS.   Final next level of care: Home/Self Care Barriers to Discharge: No Barriers Identified   Patient Goals and CMS Choice            Discharge Placement                       Discharge Plan and Services Additional resources added to the After Visit Summary for                                       Social Drivers of Health (SDOH) Interventions SDOH Screenings   Food Insecurity: No Food Insecurity (10/20/2023)  Housing: Low Risk  (10/20/2023)  Transportation Needs: No Transportation Needs (10/20/2023)  Utilities: Not At Risk (10/20/2023)  Depression (PHQ2-9): Medium Risk (12/11/2019)  Social Connections: Unknown (01/06/2022)   Received from [facility]  Tobacco Use: High Risk (10/20/2023)     Readmission Risk Interventions     No data to display

## 2023-10-21 NOTE — Care Management Obs Status (Signed)
 MEDICARE OBSERVATION STATUS NOTIFICATION   Patient Details  Name: Devin [name] MRN: [id number] Date of Birth: May 14, 1970   Medicare Observation Status Notification Given:  Yes    Shammara Jarrett G., RN 10/21/2023, 10:11 AM

## 2023-10-22 DIAGNOSIS — E785 Hyperlipidemia, unspecified: Secondary | ICD-10-CM | POA: Insufficient documentation

## 2023-10-22 DIAGNOSIS — I252 Old myocardial infarction: Secondary | ICD-10-CM | POA: Insufficient documentation

## 2023-10-22 DIAGNOSIS — I2510 Atherosclerotic heart disease of native coronary artery without angina pectoris: Secondary | ICD-10-CM | POA: Insufficient documentation

## 2023-10-22 DIAGNOSIS — E1165 Type 2 diabetes mellitus with hyperglycemia: Secondary | ICD-10-CM | POA: Insufficient documentation

## 2023-10-22 LAB — HEMOGLOBIN A1C
Hgb A1c MFr Bld: 10.3 % — ABNORMAL HIGH (ref 4.8–5.6)
Hgb A1c MFr Bld: 10.3 % — ABNORMAL HIGH (ref 4.8–5.6)
Hgb A1c MFr Bld: 10.6 % — ABNORMAL HIGH (ref 4.8–5.6)
Mean Plasma Glucose: 249 mg/dL
Mean Plasma Glucose: 249 mg/dL
Mean Plasma Glucose: 258 mg/dL

## 2023-10-24 ENCOUNTER — Telehealth: Payer: Self-pay | Admitting: Acute Care

## 2023-10-24 ENCOUNTER — Other Ambulatory Visit (HOSPITAL_COMMUNITY): Payer: Self-pay | Admitting: Radiology

## 2023-10-24 DIAGNOSIS — Z122 Encounter for screening for malignant neoplasm of respiratory organs: Secondary | ICD-10-CM

## 2023-10-24 DIAGNOSIS — F17210 Nicotine dependence, cigarettes, uncomplicated: Secondary | ICD-10-CM

## 2023-10-24 DIAGNOSIS — I771 Stricture of artery: Secondary | ICD-10-CM

## 2023-10-24 DIAGNOSIS — Z87891 Personal history of nicotine dependence: Secondary | ICD-10-CM

## 2023-10-24 DIAGNOSIS — I670 Dissection of cerebral arteries, nonruptured: Secondary | ICD-10-CM

## 2023-10-24 DIAGNOSIS — R42 Dizziness and giddiness: Secondary | ICD-10-CM

## 2023-10-24 DIAGNOSIS — G450 Vertebro-basilar artery syndrome: Secondary | ICD-10-CM

## 2023-10-24 DIAGNOSIS — R2689 Other abnormalities of gait and mobility: Secondary | ICD-10-CM

## 2023-10-24 DIAGNOSIS — I639 Cerebral infarction, unspecified: Secondary | ICD-10-CM

## 2023-10-24 DIAGNOSIS — H9319 Tinnitus, unspecified ear: Secondary | ICD-10-CM

## 2023-10-24 NOTE — Telephone Encounter (Signed)
 Lung Cancer Screening Narrative/Criteria Questionnaire (Cigarette Smokers Only- No Cigars/Pipes/vapes)   Devin [name]   SDMV:10/25/2023 10:15a [name]       [birth year]   LDCT: 10/31/2023 6:15pm AP    52 y.o.   Phone: [phone]  Lung Screening Narrative (confirm age 60-77 yrs Medicare / 50-80 yrs Private pay insurance)   Insurance information:[employer] mcr and mcd   Referring Provider:Dr. Darlean   This screening involves an initial phone call with a team member from our program. It is called a shared decision making visit. The initial meeting is required by  insurance and Medicare to make sure you understand the program. This appointment takes about 15-20 minutes to complete. You will complete the screening scan at your scheduled date/time.  This scan takes about 5-10 minutes to complete. You can eat and drink normally before and after the scan.  Criteria questions for Lung Cancer Screening:   Are you a current or former smoker? Current Age began smoking: 53yo   If you are a former smoker, what year did you quit smoking? N/A(within 15 yrs)   To calculate your smoking history, I need an accurate estimate of how many packs of cigarettes you smoked per day and for how many years. (Not just the number of PPD you are now smoking)   Years smoking 42 x Packs per day 2 = Pack years 58   (at least 20 pack yrs)   (Make sure they understand that we need to know how much they have smoked in the past, not just the number of PPD they are smoking now)  Do you have a personal history of cancer?  No    Do you have a family history of cancer? Yes  (cancer type and and relative) PGM - breast, MGF - lung?  Are you coughing up blood?  No  Have you had unexplained weight loss of 15 lbs or more in the last 6 months? No  It looks like you meet all criteria.  When would be a good time for us  to schedule you for this screening?   Additional information: N/A

## 2023-10-25 ENCOUNTER — Ambulatory Visit (INDEPENDENT_AMBULATORY_CARE_PROVIDER_SITE_OTHER): Admitting: Neuroradiology

## 2023-10-25 ENCOUNTER — Ambulatory Visit (INDEPENDENT_AMBULATORY_CARE_PROVIDER_SITE_OTHER): Admitting: Adult Health

## 2023-10-25 ENCOUNTER — Encounter: Payer: Self-pay | Admitting: Adult Health

## 2023-10-25 ENCOUNTER — Encounter: Payer: Self-pay | Admitting: Neuroradiology

## 2023-10-25 VITALS — BP 120/70 | Ht 78.0 in | Wt 234.6 lb

## 2023-10-25 DIAGNOSIS — I6522 Occlusion and stenosis of left carotid artery: Secondary | ICD-10-CM

## 2023-10-25 DIAGNOSIS — F17210 Nicotine dependence, cigarettes, uncomplicated: Secondary | ICD-10-CM

## 2023-10-25 NOTE — Patient Instructions (Signed)
 Please see below for information in regards to your upcoming procedure:   Planned procedure: Left Carotid Stent   Procedure date: You will be receiving a phone call from our Interventional radiology department  at [facility] Entrance A -  [address] [place], [place] [address]   Please review and follow the below medication instructions:   Aspirin  81mg : Increase to 4 tabs (324mg )  2 days prior to the procedure then keep taking until up to a month after the procedure.   Lasix : Hold the morning of your procedure   Empagliflozin  (Jardiance ): Hold  morning of procedure  Insulin  Aspart/ Glargine: Hold morning of procedure.   How to contact us :  If you have any questions/concerns before or after surgery, you can reach us  at [phone], or you can send a mychart message. We can be reached by phone or mychart 8am-4pm, Monday-Friday.   Registered Nurse/ scheduler:  Tinnie HERO RN-BSN Medical Assistants: Jesslyn DEL CMA Doctors: Dino Sable MD, Nancyann Burns MD

## 2023-10-25 NOTE — Patient Instructions (Signed)

## 2023-10-25 NOTE — Progress Notes (Signed)
  Virtual Visit via Telephone Note  I connected with Devin [name] , 10/25/23 10:14 AM by a telemedicine application and verified that I am speaking with the correct person using two identifiers.  Location: Patient: home Provider: home   I discussed the limitations of evaluation and management by telemedicine and the availability of in person appointments. The patient expressed understanding and agreed to proceed.   Shared Decision Making Visit Lung Cancer Screening Program [phone])   Eligibility: 53 y.o. Pack Years Smoking History Calculation = 84 pack years  (# packs/per year x # years smoked) Recent History of coughing up blood  no Unexplained weight loss? no ( >Than 15 pounds within the last 6 months ) Prior History Lung / other cancer no (Diagnosis within the last 5 years already requiring surveillance chest CT Scans). Smoking Status Current Smoker  Visit Components: Discussion included one or more decision making aids. YES Discussion included risk/benefits of screening. YES Discussion included potential follow up diagnostic testing for abnormal scans. YES Discussion included meaning and risk of over diagnosis. YES Discussion included meaning and risk of False Positives. YES Discussion included meaning of total radiation exposure. YES  Counseling Included: Importance of adherence to annual lung cancer LDCT screening. YES Impact of comorbidities on ability to participate in the program. YES Ability and willingness to under diagnostic treatment. YES  Smoking Cessation Counseling: Current Smokers:  Discussed importance of smoking cessation. yes Information about tobacco cessation classes and interventions provided to patient. yes Patient provided with ticket for LDCT Scan. yes Symptomatic Patient. NO Diagnosis Code: Tobacco Use Z72.0 Asymptomatic Patient yes  Counseling - 4 minutes of smoking cessation counseling (CT Chest Lung Cancer Screening Low Dose W/O CM)  [id number]  Z12.2-Screening of respiratory organs Z87.891-Personal history of nicotine  dependence   [name] 10/25/23

## 2023-10-25 NOTE — Progress Notes (Signed)
 Chief Complaint: Patient was seen in consultation today for  Chief Complaint  Patient presents with   Follow-up   at the request of Trudy Vaughn FALCON  Referring Physician(s): Williams,Gordon F  History of Present Illness: Devin [name] is a 52 y.o. male.  I saw him in the hospital on October 20, 2023 when he was admitted for vertigo, nausea and syncope.  I am seeing him today for repeat discussion of his severe left internal carotid artery stenosis, and discussion of treatment options.  He remains asymptomatic.  He did have a carotid ultrasound October 20, 2020 with PSV/EDV of 488/144 on the left.  This correlates with his CTA which shows greater than 80% stenosis.  Past Medical History:  Diagnosis Date   Asthma    Chest pain    CHF (congestive heart failure) (HCC)    CKD (chronic kidney disease) 11/13/2017   Coronary artery disease    a. s/p DES x2 to LAD and DES to OM in 11/2017   Depression    Dyspnea    GERD (gastroesophageal reflux disease)    Headache    History of kidney stones    History of rhabdomyolysis    Hyperlipidemia    Hypertension    Hypothyroidism    Ischemic cardiomyopathy    a. 11/2017: echo showing EF of 35-40%, diffuse HK, and Grade 2 DD   Kidney calculus 2014   Pericardial effusion    Small, by dobutamine echocardiogram, 04/2006   PONV (postoperative nausea and vomiting)    Sleep apnea    does not wear cpap   Stroke (HCC)    Tobacco abuse    Type 2 diabetes mellitus without complications (HCC) 10/07/2013   Vitamin D  deficiency     Past Surgical History:  Procedure Laterality Date   APPENDECTOMY     BIOPSY  12/07/2022   Procedure: BIOPSY;  Surgeon: Cinderella Deatrice FALCON, MD;  Location: [facility];  Service: Endoscopy;;   CARDIAC CATHETERIZATION  09/2012   Nonobstructive CAD with 30% proximal, 40% mid LAD disease; 30% proximal CFX; EF 55-65%   CHOLECYSTECTOMY N/A 01/27/2013   Procedure: LAPAROSCOPIC CHOLECYSTECTOMY;  Surgeon: Oneil DELENA Budge, MD;  Location: [facility];  Service: General;  Laterality: N/A;   CORONARY STENT INTERVENTION N/A 11/15/2017   Procedure: CORONARY STENT INTERVENTION;  Surgeon: Anner Alm ORN, MD;  Location: [facility];  Service: Cardiovascular;  Laterality: N/A;   ESOPHAGOGASTRODUODENOSCOPY (EGD) WITH PROPOFOL  N/A 12/07/2022   Procedure: ESOPHAGOGASTRODUODENOSCOPY (EGD) WITH PROPOFOL ;  Surgeon: Cinderella Deatrice FALCON, MD;  Location: [facility];  Service: Endoscopy;  Laterality: N/A;  9:45am;asa 3   ESOPHAGOGASTRODUODENOSCOPY ENDOSCOPY  11/2022   HERNIA REPAIR     As a child   INSERTION OF MESH N/A 11/13/2012   Procedure: INSERTION OF MESH;  Surgeon: Oneil DELENA Budge, MD;  Location: [facility];  Service: General;  Laterality: N/A;   IR ANGIO INTRA EXTRACRAN SEL COM CAROTID INNOMINATE BILAT MOD SED  12/01/2017   IR ANGIO INTRA EXTRACRAN SEL COM CAROTID INNOMINATE BILAT MOD SED  02/13/2018   IR ANGIO INTRA EXTRACRAN SEL COM CAROTID INNOMINATE BILAT MOD SED  09/15/2022   IR ANGIO VERTEBRAL SEL SUBCLAVIAN INNOMINATE BILAT MOD SED  09/15/2022   IR ANGIO VERTEBRAL SEL SUBCLAVIAN INNOMINATE UNI L MOD SED  02/13/2018   IR ANGIO VERTEBRAL SEL VERTEBRAL BILAT MOD SED  12/01/2017   IR ANGIO VERTEBRAL SEL VERTEBRAL UNI R MOD SED  02/13/2018   IR INTRA CRAN STENT  12/03/2017   IR PTA INTRACRANIAL  02/25/2018   IR RADIOLOGIST EVAL & MGMT  07/29/2021   IR US  GUIDE VASC ACCESS RIGHT  02/13/2018   KIDNEY STONE SURGERY     LEFT HEART CATH AND CORONARY ANGIOGRAPHY N/A 11/15/2017   Procedure: LEFT HEART CATH AND CORONARY ANGIOGRAPHY;  Surgeon: Anner Alm ORN, MD;  Location: [facility] INVASIVE CV LAB;  Service: Cardiovascular;  Laterality: N/A;   PERCUTANEOUS NEPHROLITHOTRIPSY     RADIOLOGY WITH ANESTHESIA Left 12/03/2017   Procedure: Angioplasty with possible stenting of left VBJ;  Surgeon: Dolphus Carrion, MD;  Location: [facility] OR;  Service: Radiology;  Laterality: Left;   RADIOLOGY WITH ANESTHESIA N/A 02/25/2018    Procedure: STENT PLACEMENT;  Surgeon: Dolphus Carrion, MD;  Location: [facility];  Service: Radiology;  Laterality: N/A;   UMBILICAL HERNIA REPAIR N/A 11/13/2012   Procedure: UMBILICAL HERNIORRHAPHY;  Surgeon: Oneil DELENA Budge, MD;  Location: [facility];  Service: General;  Laterality: N/A;   VARICOCELECTOMY      Allergies: Crestor [rosuvastatin], Trulicity [dulaglutide], Bee venom, and Morphine   Medications: Prior to Admission medications   Medication Sig Start Date End Date Taking? Authorizing Provider  aspirin  81 MG EC tablet Take 1 tablet (81 mg total) by mouth daily. With food 12/05/17  Yes Emokpae, Courage, MD  clopidogrel  (PLAVIX ) 75 MG tablet Take 1 tablet (75 mg total) by mouth daily. 10/15/19  Yes Johnson, Clanford L, MD  Continuous Blood Gluc Sensor (FREESTYLE LIBRE 2 SENSOR) MISC APPLY 1 PATCH TO SKIN ONCE EVERY 14 DAYS 03/27/22  Yes Nida, Gebreselassie W, MD  empagliflozin  (JARDIANCE ) 10 MG TABS tablet TAKE 1 TABLET BY MOUTH DAILY BEFORE BREAKFAST 05/28/23  Yes Nida, Gebreselassie W, MD  Evolocumab  (REPATHA  SURECLICK) 140 MG/ML SOAJ Inject 1 pen into the skin every 14 (fourteen) days. 12/24/19  Yes BranchDorn FALCON, MD  ezetimibe  (ZETIA ) 10 MG tablet Take 1 tablet (10 mg total) by mouth daily. 10/15/19  Yes Johnson, Clanford L, MD  famotidine  (PEPCID ) 40 MG tablet Take 40 mg by mouth daily. 11/23/20  Yes [provider]  fluticasone  (FLONASE ) 50 MCG/ACT nasal spray Place 2 sprays into both nostrils 2 (two) times daily. 06/24/20  Yes [provider]  furosemide  (LASIX ) 20 MG tablet Take 1 tablet (20 mg total) by mouth daily as needed. 10/19/21  Yes BranchDorn FALCON, MD  gabapentin  (NEURONTIN ) 300 MG capsule One po q AM, one po q PM and 2 po qHS 12/23/19  Yes Sater, Charlie DELENA, MD  gemfibrozil  (LOPID ) 600 MG tablet Take 600 mg by mouth 2 (two) times daily. 10/09/23  Yes [provider]  icosapent  Ethyl (VASCEPA ) 1 g capsule Take 2 capsules by mouth twice daily 02/06/22   Yes Branch, Dorn FALCON, MD  LANTUS  SOLOSTAR 100 UNIT/ML Solostar Pen Inject 70 Units into the skin at bedtime. 01/17/23  Yes [provider]  levothyroxine  (SYNTHROID ) 125 MCG tablet Take 1 tablet (125 mcg total) by mouth daily at 6 (six) AM. 10/22/23  Yes Elpidio Reyes DEL, MD  NOVOLOG  FLEXPEN 100 UNIT/ML FlexPen Inject 2 Units into the skin 3 (three) times daily as needed for high blood sugar. Patient taking differently: Inject 2 Units into the skin 3 (three) times daily as needed for high blood sugar. Patient takes 2-5 units as needed 01/17/23  Yes [provider]  pantoprazole  (PROTONIX ) 40 MG tablet TAKE 1 TABLET BY MOUTH DAILY 08/28/23  Yes Ahmed, Muhammad F, MD  pioglitazone (ACTOS) 15 MG tablet Take 15 mg by  mouth daily. 10/09/23  Yes [provider]  Tiotropium Bromide-Olodaterol (STIOLTO RESPIMAT ) 2.5-2.5 MCG/ACT AERS Inhale 2 puffs into the lungs daily. 10/03/23  Yes [name], MD  VENTOLIN  HFA 108 (90 Base) MCG/ACT inhaler Inhale 1-2 puffs into the lungs every 6 (six) hours as needed for wheezing or shortness of breath. 11/23/20  Yes [provider]  nitroGLYCERIN  (NITROSTAT ) 0.4 MG SL tablet Place 1 tablet (0.4 mg total) under the tongue every 5 (five) minutes as needed for chest pain. 02/14/21   Alvan Dorn FALCON, MD     Family History  Problem Relation Age of Onset   Coronary artery disease Father    Hypertension Father    Hyperlipidemia Father    Diabetes Father    Congestive Heart Failure Father 11   Arrhythmia Father        had an ICD   Diabetes Mother    Hypertension Mother    Hyperlipidemia Mother    Obesity Mother 27       died after bariatric surgery, liver failure and infection   Coronary artery disease Maternal Grandfather        both grandfathers and several uncles   Coronary artery disease Other    Diabetes Sister        both sisters   Stroke Maternal Aunt    Stroke Maternal Uncle     Social History   Socioeconomic  History   Marital status: Divorced    Spouse name: Not on file   Number of children: Not on file   Years of education: Not on file   Highest education level: Not on file  Occupational History   Occupation: Disability     Comment:      Employer: [employer]  Tobacco Use   Smoking status: Every Day    Current packs/day: 1.00    Average packs/day: 1 pack/day for 38.6 years (38.6 ttl pk-yrs)    Types: Cigarettes    Start date: 03/13/1985    Passive exposure: Never   Smokeless tobacco: Former    Types: Snuff, Chew    Quit date: 03/14/1987  Vaping Use   Vaping status: Former   Devices: quit in 2011  Substance and Sexual Activity   Alcohol use: No   Drug use: No   Sexual activity: Not on file  Other Topics Concern   Not on file  Social History Narrative   Eats fast food daily   No exercise outside of work   Lives in [place] with his dog   MAINTENANCE TECHNICIAN   Caffeine use: tea/soda daily   Left handed    Social Drivers of Health   Financial Resource Strain: Not on file  Food Insecurity: No Food Insecurity (10/20/2023)   Hunger Vital Sign    Worried About Running Out of Food in the Last Year: Never true    Ran Out of Food in the Last Year: Never true  Transportation Needs: No Transportation Needs (10/20/2023)   PRAPARE - Administrator, Civil Service (Medical): No    Lack of Transportation (Non-Medical): No  Physical Activity: Not on file  Stress: Not on file  Social Connections: Unknown (01/06/2022)   Received from [facility]   Social Network    Social Network: Not on file    Review of Systems  Respiratory:  Negative for chest tightness and shortness of breath.   Cardiovascular:  Negative for chest pain.  Gastrointestinal:  Negative for blood in stool.    Vital  Signs: BP 120/70 (BP Location: Right Arm, Cuff Size: Normal)   Ht 6' 6 (1.981 m)   Wt 234 lb 9.6 oz (106.4 kg)   BMI 27.11 kg/m   Physical Exam Constitutional:      Appearance: Normal  appearance.  HENT:     Mouth/Throat:     Comments: Edentulous Cardiovascular:     Rate and Rhythm: Normal rate and regular rhythm.     Pulses: Normal pulses.  Pulmonary:     Effort: Pulmonary effort is normal.     Breath sounds: Normal breath sounds.  Neurological:     Mental Status: He is alert.     Comments: Alert and oriented with normal speech expression, fluency and comprehension. Visual fields are full to confrontation.   Face is symmetric. Strength in the arms and legs is symmetric with no drift.  Sensation is normal and symmetric. No ataxia. No inattention.      Imaging: VAS US  CAROTID Result Date: 10/24/2023 Carotid Arterial Duplex Study Patient Name:  Devin [name]  Date of Exam:   10/21/2023 Medical Rec #: [id number]       Accession #:    [id number] Date of Birth: [birth year]      Patient Gender: M Patient Age:   55 years Exam Location:  [facility] Procedure:      VAS US  CAROTID Referring Phys: NANCYANN BURNS --------------------------------------------------------------------------------  Indications:       Syncope and Dizziness. Restenosis of left vertebrobasilar                    artery stent. Risk Factors:      Hypertension, hyperlipidemia, Diabetes, current smoker,                    coronary artery disease, prior CVA, PAD Carotid artery                    disease. Other Factors:     Vertebrobasilar stent 2019. Limitations        Today's exam was limited due to [place]. Comparison Study:  Prior carotid duplex done in [facility] 07/19/23 indicated 1-39%                    right ICA stenosis and 40-59% left ICA stenosis. Performing Technologist: Alberta Lis RVS  Examination Guidelines: A complete evaluation includes B-mode imaging, spectral Doppler, color Doppler, and power Doppler as needed of all accessible portions of each vessel. Bilateral testing is considered an integral part of a complete examination. Limited examinations for reoccurring indications may be performed as  noted.  Right Carotid Findings: +----------+--------+--------+--------+------------------+------------------+           PSV cm/sEDV cm/sStenosisPlaque DescriptionComments           +----------+--------+--------+--------+------------------+------------------+ CCA Prox  106     22                                intimal thickening +----------+--------+--------+--------+------------------+------------------+ CCA Distal78      22              heterogenous                         +----------+--------+--------+--------+------------------+------------------+ ICA Prox  105     27      1-39%   heterogenous                         +----------+--------+--------+--------+------------------+------------------+  ICA Mid   90      27                                                   +----------+--------+--------+--------+------------------+------------------+ ICA Distal101     41                                                   +----------+--------+--------+--------+------------------+------------------+ ECA       144     25                                                   +----------+--------+--------+--------+------------------+------------------+ +----------+--------+-------+--------+-------------------+           PSV cm/sEDV cmsDescribeArm Pressure (mmHG) +----------+--------+-------+--------+-------------------+ Subclavian101                                        +----------+--------+-------+--------+-------------------+ +---------+--------+--+--------+--+ VertebralPSV cm/s39EDV cm/s13 +---------+--------+--+--------+--+  Left Carotid Findings: +----------+--------+--------+--------+-----------------+----------------------+           PSV cm/sEDV cm/sStenosisPlaque           Comments                                                 Description                              +----------+--------+--------+--------+-----------------+----------------------+ CCA Prox  90      21                               intimal thickening     +----------+--------+--------+--------+-----------------+----------------------+ CCA Distal77      28                               intimal thickening     +----------+--------+--------+--------+-----------------+----------------------+ ICA Prox  484     144     80-99%  calcific                                +----------+--------+--------+--------+-----------------+----------------------+ ICA Mid   93      37                               Post stenotic waveform +----------+--------+--------+--------+-----------------+----------------------+ ICA Distal93      39                                                      +----------+--------+--------+--------+-----------------+----------------------+  ECA       570     121     >50%    calcific                                +----------+--------+--------+--------+-----------------+----------------------+ +----------+--------+--------+--------+-------------------+           PSV cm/sEDV cm/sDescribeArm Pressure (mmHG) +----------+--------+--------+--------+-------------------+ [id number]                                         +----------+--------+--------+--------+-------------------+ +---------+--------+--+--------+--+ VertebralPSV cm/s54EDV cm/s16 +---------+--------+--+--------+--+   Summary: Right Carotid: Velocities in the right ICA are consistent with a 1-39% stenosis. Left Carotid: Velocities in the left ICA are consistent with a 80-99% stenosis.               The ECA appears >50% stenosed. Stenosis noted at bulb/proximal               ICA. Vertebrals:  Bilateral vertebral arteries demonstrate antegrade flow. Subclavians: Normal flow hemodynamics were seen in bilateral subclavian              arteries. *See table(s) above for measurements and observations.   Electronically signed by Eather Popp MD on 10/24/2023 at 7:21:10 AM.    Final    ECHOCARDIOGRAM COMPLETE Result Date: 10/21/2023    ECHOCARDIOGRAM REPORT   Patient Name:   Devin [name] Date of Exam: 10/21/2023 Medical Rec #:  [id number]      Height:       77.0 in Accession #:    [id number]     Weight:       235.9 lb Date of Birth:  [birth year]     BSA:          2.399 m Patient Age:    52 years       BP:           127/82 mmHg Patient Gender: M              HR:           71 bpm. Exam Location:  Inpatient Procedure: 2D Echo, 3D Echo, Cardiac Doppler, Color Doppler and Strain Analysis            (Both Spectral and Color Flow Doppler were utilized during            procedure). Indications:    Syncope  History:        Patient has prior history of Echocardiogram examinations, most                 recent 02/26/2023. CAD, COPD; Risk Factors:Hypertension,                 Diabetes, Dyslipidemia and Current Smoker.  Sonographer:    Philomena Daring Referring Phys: REYES VEAR GAW IMPRESSIONS  1. Left ventricular ejection fraction, by estimation, is 55 to 60%. The left ventricle has normal function. The left ventricle has no regional wall motion abnormalities. There is mild left ventricular hypertrophy. Left ventricular diastolic parameters were normal. The average left ventricular global longitudinal strain is -16.4 %. The global longitudinal strain is normal.  2. Right ventricular systolic function is normal. The right ventricular size is normal. Tricuspid regurgitation signal is inadequate for assessing PA pressure.  3. The mitral valve is normal in structure. Trivial mitral valve regurgitation. No evidence of  mitral stenosis.  4. The aortic valve was not well visualized. Aortic valve regurgitation is not visualized. No aortic stenosis is present.  5. The inferior vena cava is normal in size with greater than 50% respiratory variability, suggesting right atrial pressure of 3 mmHg. Comparison(s): No significant change from  prior study. FINDINGS  Left Ventricle: Left ventricular ejection fraction, by estimation, is 55 to 60%. The left ventricle has normal function. The left ventricle has no regional wall motion abnormalities. The average left ventricular global longitudinal strain is -16.4 %. Strain was performed and the global longitudinal strain is normal. The left ventricular internal cavity size was normal in size. There is mild left ventricular hypertrophy. Left ventricular diastolic parameters were normal. Right Ventricle: The right ventricular size is normal. No increase in right ventricular wall thickness. Right ventricular systolic function is normal. Tricuspid regurgitation signal is inadequate for assessing PA pressure. Left Atrium: Left atrial size was normal in size. Right Atrium: Right atrial size was normal in size. Pericardium: There is no evidence of pericardial effusion. Mitral Valve: The mitral valve is normal in structure. Trivial mitral valve regurgitation. No evidence of mitral valve stenosis. Tricuspid Valve: The tricuspid valve is normal in structure. Tricuspid valve regurgitation is not demonstrated. No evidence of tricuspid stenosis. Aortic Valve: The aortic valve was not well visualized. Aortic valve regurgitation is not visualized. No aortic stenosis is present. Aortic valve mean gradient measures 5.0 mmHg. Aortic valve peak gradient measures 9.1 mmHg. Aortic valve area, by VTI measures 2.52 cm. Pulmonic Valve: The pulmonic valve was grossly normal. Pulmonic valve regurgitation is not visualized. No evidence of pulmonic stenosis. Aorta: The aortic root and ascending aorta are structurally normal, with no evidence of dilitation. Venous: The inferior vena cava is normal in size with greater than 50% respiratory variability, suggesting right atrial pressure of 3 mmHg. IAS/Shunts: No atrial level shunt detected by color flow Doppler. Additional Comments: 3D was performed not requiring image post processing on  an independent workstation and was indeterminate.  LEFT VENTRICLE PLAX 2D LVIDd:         5.60 cm      Diastology LVIDs:         3.90 cm      LV e' medial:    11.30 cm/s LV PW:         1.10 cm      LV E/e' medial:  9.9 LV IVS:        1.20 cm      LV e' lateral:   10.70 cm/s LVOT diam:     2.00 cm      LV E/e' lateral: 10.5 LV SV:         76 LV SV Index:   32           2D Longitudinal Strain LVOT Area:     3.14 cm     2D Strain GLS Avg:     -16.4 %  LV Volumes (MOD) LV vol d, MOD A2C: 92.2 ml LV vol d, MOD A4C: 126.0 ml LV vol s, MOD A2C: 38.5 ml LV vol s, MOD A4C: 52.1 ml LV SV MOD A2C:     53.7 ml LV SV MOD A4C:     126.0 ml LV SV MOD BP:      64.8 ml RIGHT VENTRICLE             IVC RV S prime:     12.30 cm/s  IVC diam: 1.70 cm TAPSE (M-mode): 1.8  cm LEFT ATRIUM             Index        RIGHT ATRIUM           Index LA diam:        3.80 cm 1.58 cm/m   RA Area:     11.50 cm LA Vol (A2C):   41.1 ml 17.13 ml/m  RA Volume:   26.40 ml  11.00 ml/m LA Vol (A4C):   47.4 ml 19.76 ml/m LA Biplane Vol: 44.1 ml 18.38 ml/m  AORTIC VALVE AV Area (Vmax):    2.39 cm AV Area (Vmean):   2.41 cm AV Area (VTI):     2.52 cm AV Vmax:           151.00 cm/s AV Vmean:          99.800 cm/s AV VTI:            0.300 m AV Peak Grad:      9.1 mmHg AV Mean Grad:      5.0 mmHg LVOT Vmax:         115.00 cm/s LVOT Vmean:        76.500 cm/s LVOT VTI:          0.241 m LVOT/AV VTI ratio: 0.80  AORTA Ao Root diam: 2.20 cm Ao Asc diam:  3.00 cm MITRAL VALVE MV Area (PHT): 3.53 cm     SHUNTS MV Decel Time: 215 msec     Systemic VTI:  0.24 m MV E velocity: 112.00 cm/s  Systemic Diam: 2.00 cm MV A velocity: 106.00 cm/s MV E/A ratio:  1.06 Vishnu Priya Mallipeddi Electronically signed by Diannah Late Mallipeddi Signature Date/Time: 10/21/2023/3:39:06 PM    Final    CT ANGIO HEAD NECK W WO CM Result Date: 10/19/2023 CLINICAL DATA:  Initial evaluation for acute neuro deficit, stroke suspected. EXAM: CT ANGIOGRAPHY HEAD AND NECK WITH AND WITHOUT  CONTRAST TECHNIQUE: Multidetector CT imaging of the head and neck was performed using the standard protocol during bolus administration of intravenous contrast. Multiplanar CT image reconstructions and MIPs were obtained to evaluate the vascular anatomy. Carotid stenosis measurements (when applicable) are obtained utilizing NASCET criteria, using the distal internal carotid diameter as the denominator. RADIATION DOSE REDUCTION: This exam was performed according to the departmental dose-optimization program which includes automated exposure control, adjustment of the mA and/or kV according to patient size and/or use of iterative reconstruction technique. CONTRAST:  60mL OMNIPAQUE  IOHEXOL  350 MG/ML SOLN, 60mL OMNIPAQUE  IOHEXOL  350 MG/ML SOLN COMPARISON:  CT from earlier the same day. FINDINGS: With standard branch pattern. Aortic atherosclerosis. No high-grade stenosis about the origin the great vessels. CTA NECK FINDINGS Aortic arch: Visualized aortic arch within normal limits for caliber Right carotid system: Right common internal carotid arteries are patent without dissection. Calcified plaque about the right carotid bulb without hemodynamically significant greater than 50% stenosis. Left carotid system: Left common and internal carotid arteries are patent without dissection. Moderate mixed plaque about the left carotid bulb with associated stenosis of up to 70% by NASCET criteria. Vertebral arteries: Both vertebral arteries arise from subclavian arteries. Atheromatous change about the origins of the vertebral arteries with associated severe left and moderate right ostial stenoses. Vertebral arteries are otherwise patent without stenosis or dissection. Skeleton: No worrisome osseous lesions. Patient is edentulous. Moderate spondylosis at C6-7. Other neck: No other acute finding. Upper chest: No other acute finding. Review of the MIP images confirms the above findings CTA HEAD  FINDINGS Anterior circulation:  Evaluation of the intracranial circulation limited by timing of the contrast bolus. Both internal carotid arteries patent to the termini without significant stenosis. A1 segments patent bilaterally. Normal anterior communicating artery complex. Anterior cerebral arteries patent without significant stenosis. No M1 stenosis or occlusion. Distal MCA branches perfused and symmetric. Posterior circulation: Right V4 segment patent without stenosis. Right PICA patent. Left vertebral artery patent as it crosses into the cranial vault. Left PICA not seen. Vascular stent extending from the distal left V4 segment into distal basilar artery again seen. There is a persistent focal filling defect within the distal left V4 segment just distal to the proximal landing zone of the stent (series 6, image 145). While this could reflect artifact, the persistence of this finding a relation of prior exam suggested this likely reflects chronic thrombus or possibly a small chronic dissection. Resultant severe stenosis at this level. Left PICA not seen. Basilar otherwise patent distally without stenosis. Superior cerebellar and posterior cerebral arteries patent bilaterally. Venous sinuses: Patent allowing for timing the contrast bolus. Anatomic variants: As above.  No aneurysm. Review of the MIP images confirms the above findings IMPRESSION: 1. Vascular stent extending from the left V4 segment into the distal basilar artery. Persistent focal filling defect within the distal left V4 segment just distal to the proximal landing zone of the stent, similar to prior, and suspicious for chronic thrombus or possibly a small chronic dissection. Resultant severe stenosis at this level. Overall, appearance is relatively similar as compared to prior CTA from 03/08/2023. 2. Moderate mixed plaque about the left carotid bulb with associated stenosis of up to 70% by NASCET criteria. 3. Atheromatous change about the origins of the vertebral arteries with  associated severe left and moderate right ostial stenoses. 4.  Aortic Atherosclerosis (ICD10-I70.0). Electronically Signed   By: Morene Hoard M.D.   On: 10/19/2023 20:52   CT Angio Chest PE W and/or Wo Contrast Result Date: 10/19/2023 CLINICAL DATA:  Weakness and shortness of breath, initial encounter EXAM: CT ANGIOGRAPHY CHEST WITH CONTRAST TECHNIQUE: Multidetector CT imaging of the chest was performed using the standard protocol during bolus administration of intravenous contrast. Multiplanar CT image reconstructions and MIPs were obtained to evaluate the vascular anatomy. RADIATION DOSE REDUCTION: This exam was performed according to the departmental dose-optimization program which includes automated exposure control, adjustment of the mA and/or kV according to patient size and/or use of iterative reconstruction technique. CONTRAST:  60mL OMNIPAQUE  IOHEXOL  350 MG/ML SOLN, 60mL OMNIPAQUE  IOHEXOL  350 MG/ML SOLN COMPARISON:  None Available. FINDINGS: Cardiovascular: Thoracic aorta shows a normal branching pattern. Mild atherosclerotic calcifications are seen. No dissection is identified. Heart is not significantly enlarged in size. Diffuse coronary calcifications are seen. The pulmonary artery shows a normal branching pattern bilaterally. No definitive filling defects to suggest pulmonary embolism are noted. Mediastinum/Nodes: Thoracic inlet is within normal limits. No hilar or mediastinal adenopathy is noted. Scattered small mediastinal nodes are noted stable from the prior exam and likely reactive in nature. The esophagus as visualized is within normal limits. Lungs/Pleura: Lungs are well aerated bilaterally. Mild emphysematous changes are noted. No focal infiltrate or effusion is seen. No parenchymal nodule is noted. Upper Abdomen: Visualized upper abdomen is unremarkable. Gallbladder has been surgically removed. Musculoskeletal: Degenerative changes of the thoracic spine are noted. Review of the MIP  images confirms the above findings. IMPRESSION: No evidence of pulmonary emboli. No acute abnormality is seen. Aortic Atherosclerosis (ICD10-I70.0) and Emphysema (ICD10-J43.9). Electronically Signed   By: Oneil  Lukens M.D.   On: 10/19/2023 20:16   CT Head Wo Contrast Result Date: 10/19/2023 CLINICAL DATA:  Increasing headaches EXAM: CT HEAD WITHOUT CONTRAST TECHNIQUE: Contiguous axial images were obtained from the base of the skull through the vertex without intravenous contrast. RADIATION DOSE REDUCTION: This exam was performed according to the departmental dose-optimization program which includes automated exposure control, adjustment of the mA and/or kV according to patient size and/or use of iterative reconstruction technique. 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 No acute hemorrhage, acute infarction or space-occupying mass lesion is seen. Vascular: Basilar arterial stent is noted. Skull: Normal. Negative for fracture or focal lesion. Sinuses/Orbits: No acute finding. Other: None. IMPRESSION: No acute intracranial abnormality noted. Chronic changes as previously described. Electronically Signed   By: Oneil Devonshire M.D.   On: 10/19/2023 20:12    Labs:  CBC: Recent Labs    10/03/23 0946 10/19/23 1927 10/20/23 1230 10/21/23 0347  WBC 8.9 6.6 6.9 6.7  HGB 16.5 16.8 15.4 14.8  HCT 51.9* 50.2 48.2 46.6  PLT 299 289 306 274    COAGS: Recent Labs    03/08/23 1744 10/19/23 1927  INR 1.0 1.0  APTT 26  --     BMP: Recent Labs    03/08/23 1655 10/19/23 1927 10/20/23 1230 10/21/23 0347  NA 137 137 139 139  K 3.4* 3.6 3.8 3.8  CL 101 101 103 106  CO2 27 23 26 23   GLUCOSE 90 123* 208* 334*  BUN 16 16 13 15   CALCIUM 9.9 9.6 9.5 9.2  CREATININE 0.95 1.10 1.24 1.12  GFRNONAA >60 >60 >60 >60    LIVER FUNCTION TESTS: Recent Labs    11/07/22 1200 03/08/23 1655 10/19/23 1927  BILITOT 0.2  0.5 0.9  AST 9 15 15   ALT 9 12 13   ALKPHOS 148* 129* 77  PROT 6.7 7.5 7.4  ALBUMIN 4.2 3.8 3.8    Assessment:  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  I have explained the diagnosis of an asymptomatic stenosis.  I have explained that even severe stenosis, when not causing symptoms of stroke, may have a favorable prognosis with medical therapy alone.  I have also discussed and explained the surgical options of carotid endarterectomy and carotid stenting, including percutaneous carotid stenting and TCAR.  His Sister Donny, who is a Engineer, civil (consulting), was present with him today.  They both understand the above information.  He feels strongly about having the left carotid artery treated, which is a reasonable decision given the severity of the stenosis and his young age, despite his comorbid conditions.  He further expressed his desire to proceed with percutaneous stenting, which I think can be done from a radial access.  If this is not possible for some reason we can use a femoral access.  I have carefully explained the risks of the procedure, especially the small risk of a ischemic stroke which can be disabling.  Plan:  1.  Left carotid stent with radial artery access, femoral access if not possible, with nursing sedation/monitoring (only fentanyl , no Versed ) 2.  Increase aspirin  dose to 325 mg daily 2 days before the procedure. 3.  Hold insulin  morning of the procedure.  ASA: 2 Mallampati: 3    Electronically Signed: Nancyann LULLA Burns  10/25/2023, 2:24 PM

## 2023-10-29 ENCOUNTER — Telehealth: Payer: Self-pay | Admitting: Neuroradiology

## 2023-10-29 NOTE — Telephone Encounter (Signed)
 Patients sister called because she was told to follow up Monday if they havent heard anything from IR and she said they havent

## 2023-10-29 NOTE — Telephone Encounter (Signed)
 I checked that this had been sent to IR for scheduling unfortunately not- I have sent this to [facility] for scheduling when she returns tomorrow.

## 2023-10-30 ENCOUNTER — Other Ambulatory Visit (HOSPITAL_COMMUNITY): Payer: Self-pay | Admitting: Neuroradiology

## 2023-10-30 DIAGNOSIS — I771 Stricture of artery: Secondary | ICD-10-CM

## 2023-10-31 ENCOUNTER — Ambulatory Visit (HOSPITAL_COMMUNITY)
Admission: RE | Admit: 2023-10-31 | Discharge: 2023-10-31 | Disposition: A | Discharge status: Home / Self Care | Source: Ambulatory Visit | Attending: Acute Care | Admitting: Acute Care

## 2023-10-31 DIAGNOSIS — Z122 Encounter for screening for malignant neoplasm of respiratory organs: Secondary | ICD-10-CM | POA: Insufficient documentation

## 2023-10-31 DIAGNOSIS — F17210 Nicotine dependence, cigarettes, uncomplicated: Secondary | ICD-10-CM | POA: Insufficient documentation

## 2023-10-31 DIAGNOSIS — Z87891 Personal history of nicotine dependence: Secondary | ICD-10-CM | POA: Diagnosis not present

## 2023-11-05 ENCOUNTER — Other Ambulatory Visit: Payer: Self-pay

## 2023-11-05 ENCOUNTER — Ambulatory Visit (HOSPITAL_COMMUNITY)
Admission: RE | Admit: 2023-11-05 | Discharge: 2023-11-05 | DRG: 035 | Disposition: A | Discharge status: Home / Self Care | Attending: Neuroradiology | Admitting: Neuroradiology

## 2023-11-05 ENCOUNTER — Other Ambulatory Visit (HOSPITAL_COMMUNITY): Payer: Self-pay | Admitting: Neuroradiology

## 2023-11-05 VITALS — BP 110/70 | HR 69 | Temp 97.6°F | Resp 18 | Ht 77.0 in | Wt 238.0 lb

## 2023-11-05 DIAGNOSIS — I2510 Atherosclerotic heart disease of native coronary artery without angina pectoris: Secondary | ICD-10-CM | POA: Diagnosis present

## 2023-11-05 DIAGNOSIS — Z7902 Long term (current) use of antithrombotics/antiplatelets: Secondary | ICD-10-CM | POA: Diagnosis not present

## 2023-11-05 DIAGNOSIS — F17210 Nicotine dependence, cigarettes, uncomplicated: Secondary | ICD-10-CM | POA: Diagnosis not present

## 2023-11-05 DIAGNOSIS — E785 Hyperlipidemia, unspecified: Secondary | ICD-10-CM | POA: Diagnosis present

## 2023-11-05 DIAGNOSIS — Z8249 Family history of ischemic heart disease and other diseases of the circulatory system: Secondary | ICD-10-CM | POA: Diagnosis not present

## 2023-11-05 DIAGNOSIS — N189 Chronic kidney disease, unspecified: Secondary | ICD-10-CM | POA: Diagnosis not present

## 2023-11-05 DIAGNOSIS — Z7984 Long term (current) use of oral hypoglycemic drugs: Secondary | ICD-10-CM | POA: Diagnosis not present

## 2023-11-05 DIAGNOSIS — I130 Hypertensive heart and chronic kidney disease with heart failure and stage 1 through stage 4 chronic kidney disease, or unspecified chronic kidney disease: Secondary | ICD-10-CM | POA: Diagnosis present

## 2023-11-05 DIAGNOSIS — I255 Ischemic cardiomyopathy: Secondary | ICD-10-CM | POA: Diagnosis not present

## 2023-11-05 DIAGNOSIS — Z79890 Hormone replacement therapy (postmenopausal): Secondary | ICD-10-CM

## 2023-11-05 DIAGNOSIS — E1122 Type 2 diabetes mellitus with diabetic chronic kidney disease: Secondary | ICD-10-CM | POA: Diagnosis not present

## 2023-11-05 DIAGNOSIS — Z7982 Long term (current) use of aspirin: Secondary | ICD-10-CM | POA: Insufficient documentation

## 2023-11-05 DIAGNOSIS — I771 Stricture of artery: Secondary | ICD-10-CM

## 2023-11-05 DIAGNOSIS — I509 Heart failure, unspecified: Secondary | ICD-10-CM | POA: Diagnosis not present

## 2023-11-05 DIAGNOSIS — Z955 Presence of coronary angioplasty implant and graft: Secondary | ICD-10-CM | POA: Diagnosis not present

## 2023-11-05 DIAGNOSIS — Z833 Family history of diabetes mellitus: Secondary | ICD-10-CM

## 2023-11-05 DIAGNOSIS — Z8673 Personal history of transient ischemic attack (TIA), and cerebral infarction without residual deficits: Secondary | ICD-10-CM

## 2023-11-05 DIAGNOSIS — E039 Hypothyroidism, unspecified: Secondary | ICD-10-CM | POA: Diagnosis present

## 2023-11-05 DIAGNOSIS — I6522 Occlusion and stenosis of left carotid artery: Secondary | ICD-10-CM | POA: Diagnosis present

## 2023-11-05 DIAGNOSIS — Z794 Long term (current) use of insulin: Secondary | ICD-10-CM | POA: Insufficient documentation

## 2023-11-05 HISTORY — PX: IR US GUIDE VASC ACCESS RIGHT: IMG2390

## 2023-11-05 HISTORY — PX: IR INTRAVSC STENT CERV CAROTID W/EMB-PROT MOD SED INCL ANGIO: IMG2303

## 2023-11-05 LAB — CBC
HCT: 54.8 % — ABNORMAL HIGH (ref 39.0–52.0)
Hemoglobin: 17.4 g/dL — ABNORMAL HIGH (ref 13.0–17.0)
MCH: 27.3 pg (ref 26.0–34.0)
MCHC: 31.8 g/dL (ref 30.0–36.0)
MCV: 86 fL (ref 80.0–100.0)
Platelets: 355 K/uL (ref 150–400)
RBC: 6.37 MIL/uL — ABNORMAL HIGH (ref 4.22–5.81)
RDW: 15.3 % (ref 11.5–15.5)
WBC: 10.7 K/uL — ABNORMAL HIGH (ref 4.0–10.5)
nRBC: 0 % (ref 0.0–0.2)

## 2023-11-05 LAB — BASIC METABOLIC PANEL WITH GFR
Anion gap: 10 (ref 5–15)
BUN: 24 mg/dL — ABNORMAL HIGH (ref 6–20)
CO2: 24 mmol/L (ref 22–32)
Calcium: 10.2 mg/dL (ref 8.9–10.3)
Chloride: 102 mmol/L (ref 98–111)
Creatinine, Ser: 1.19 mg/dL (ref 0.61–1.24)
GFR, Estimated: >60 mL/min (ref >60)
Glucose, Bld: 135 mg/dL — ABNORMAL HIGH (ref 70–99)
Potassium: 3.9 mmol/L (ref 3.5–5.1)
Sodium: 136 mmol/L (ref 135–145)

## 2023-11-05 LAB — NO BLOOD PRODUCTS

## 2023-11-05 LAB — GLUCOSE, CAPILLARY
Glucose-Capillary: 149 mg/dL — ABNORMAL HIGH (ref 70–99)
Glucose-Capillary: 115 mg/dL — ABNORMAL HIGH (ref 70–99)

## 2023-11-05 MED ORDER — FAMOTIDINE IN NACL 20-0.9 MG/50ML-% IV SOLN: 20.0000 mg | Freq: Once | INTRAVENOUS | Status: DC

## 2023-11-05 MED ORDER — LIDOCAINE HCL 1 % IJ SOLN
INTRAMUSCULAR | Status: AC
  Filled 2023-11-05: qty 20

## 2023-11-05 MED ORDER — ONDANSETRON HCL 4 MG/2ML IJ SOLN
INTRAMUSCULAR | Status: AC
  Filled 2023-11-05: qty 2

## 2023-11-05 MED ORDER — ASPIRIN 325 MG PO TABS: 325.0000 mg | ORAL_TABLET | Freq: Every day | ORAL | Status: DC

## 2023-11-05 MED ORDER — ATROPINE SULFATE 1 MG/10ML IJ SOSY
PREFILLED_SYRINGE | INTRAMUSCULAR | Status: AC
  Filled 2023-11-05: qty 10

## 2023-11-05 MED ORDER — IOHEXOL 300 MG/ML  SOLN
100.0000 mL | Freq: Once | INTRAMUSCULAR | Status: AC | PRN
  Administered 2023-11-05: 30 mL via INTRA_ARTERIAL

## 2023-11-05 MED ORDER — ACETAMINOPHEN 325 MG PO TABS
650.0000 mg | ORAL_TABLET | ORAL | Status: DC | PRN
Start: 2023-11-05 — End: 2023-11-06

## 2023-11-05 MED ORDER — ACETAMINOPHEN 160 MG/5ML PO SOLN: 650.0000 mg | ORAL | Status: DC | PRN

## 2023-11-05 MED ORDER — MIDAZOLAM HCL 2 MG/2ML IJ SOLN
INTRAMUSCULAR | Status: AC
  Filled 2023-11-05: qty 2

## 2023-11-05 MED ORDER — HEPARIN SODIUM (PORCINE) 1000 UNIT/ML IJ SOLN
INTRAMUSCULAR | Status: AC
Start: 2023-11-05 — End: 2023-11-05
  Filled 2023-11-05: qty 10

## 2023-11-05 MED ORDER — CLOPIDOGREL BISULFATE 75 MG PO TABS: 75.0000 mg | ORAL_TABLET | Freq: Every day | ORAL | Status: DC

## 2023-11-05 MED ORDER — VERAPAMIL HCL 2.5 MG/ML IV SOLN
INTRAVENOUS | Status: AC
  Filled 2023-11-05: qty 2

## 2023-11-05 MED ORDER — NITROGLYCERIN 1 MG/10 ML FOR IR/CATH LAB
INTRA_ARTERIAL | Status: AC
  Filled 2023-11-05: qty 10

## 2023-11-05 MED ORDER — ASPIRIN 81 MG PO CHEW: 324.0000 mg | CHEWABLE_TABLET | Freq: Every day | ORAL | Status: DC

## 2023-11-05 MED ORDER — ATROPINE SULFATE 1 MG/10ML IJ SOSY
PREFILLED_SYRINGE | INTRAMUSCULAR | Status: AC | PRN
  Administered 2023-11-05: 1 mg via INTRAVENOUS

## 2023-11-05 MED ORDER — FENTANYL CITRATE (PF) 100 MCG/2ML IJ SOLN
INTRAMUSCULAR | Status: AC
  Filled 2023-11-05: qty 2

## 2023-11-05 MED ORDER — HEPARIN SODIUM (PORCINE) 1000 UNIT/ML IJ SOLN
INTRAMUSCULAR | Status: AC | PRN
  Administered 2023-11-05: 6000 [IU] via INTRAVENOUS

## 2023-11-05 MED ORDER — LIDOCAINE HCL 1 % IJ SOLN
20.0000 mL | Freq: Once | INTRAMUSCULAR | Status: AC
  Administered 2023-11-05: 10 mL via INTRADERMAL

## 2023-11-05 MED ORDER — ONDANSETRON HCL 4 MG/2ML IJ SOLN
INTRAMUSCULAR | Status: AC | PRN
  Administered 2023-11-05: 4 mg via INTRAVENOUS

## 2023-11-05 MED ORDER — VERAPAMIL HCL 2.5 MG/ML IV SOLN: INTRA_ARTERIAL | Status: AC | PRN

## 2023-11-05 MED ORDER — FENTANYL CITRATE (PF) 100 MCG/2ML IJ SOLN
INTRAMUSCULAR | Status: AC | PRN
  Administered 2023-11-05: 50 ug via INTRAVENOUS

## 2023-11-05 MED ORDER — ACETAMINOPHEN 650 MG RE SUPP
650.0000 mg | RECTAL | Status: DC | PRN
Start: 2023-11-05 — End: 2023-11-06

## 2023-11-05 MED ORDER — SODIUM CHLORIDE 0.9 % IV BOLUS
250.0000 mL | INTRAVENOUS | Status: DC | PRN
Start: 2023-11-05 — End: 2023-11-06

## 2023-11-05 MED ORDER — FAMOTIDINE IN NACL 20-0.9 MG/50ML-% IV SOLN
20.0000 mg | Freq: Once | INTRAVENOUS | Status: AC
  Administered 2023-11-05: 20 mg via INTRAVENOUS
  Filled 2023-11-05 (×2): qty 50

## 2023-11-05 NOTE — Progress Notes (Signed)
 2mL air removed from TR band. Per MD Heck, remove 2mL every .

## 2023-11-05 NOTE — H&P (Signed)
 I have reviewed and confirmed my history and physical from 10/20/23.  He reports that he has had worsening of vision in his left eye that occurred relatively suddenly.  He feels like he has tunnel vision with some loss of the peripheral vision.  There is no pain.  He does seem to have peripheral vision loss in all 4 quadrants.  There have been no other changes to his neurologic exam.  No loss of strength or sensation.  Plan for left carotid stent.  Risks and benefits reviewed.   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  No intervention needed for the intracranial left vertebral artery stent restenosis.  I do not think this is the cause of his symptoms and the procedure entails risk with limited benefit 2. I have explained the diagnosis of an asymptomatic stenosis.  I have explained that even severe stenosis, when not causing symptoms of stroke, may have a favorable prognosis with medical therapy alone.  I have also discussed and explained the surgical options of carotid endarterectomy and carotid stenting, including percutaneous carotid stenting and TCAR.  We will follow-up in the office later this week.      Chief Complaint: Patient was seen in consultation today for     Chief Complaint  Patient presents with   Dizziness   at the request of * No referring provider recorded for this case *   Referring  Physician(s): * No referring provider recorded for this case *   History of Present Illness: Devin [name] is a 53 y.o. left-handed male.   He presented yesterday with an episode of vertigo, nausea and vomiting.  He went to the emergency room where he passed out.   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  It took him several months to recover.  He was in the hospital for 13 days.  During that admission he had a stent in his intracranial left vertebral artery that extends into the basilar artery.   Also around that time he had coronary stents placed.   His risk factors for vascular disease include long-term smoking (started when he was 53 years old), dyslipidemia, hypertension and diabetes which is insulin -dependent.   His symptoms have now resolved.  He is not having any of the vertigo or dizziness.  The nausea has resolved and he feels hungry.  He has a low-grade headache but has had a headache for several years.   He had a CT scan and CT arteriogram yesterday, both of which I reviewed.  The CT shows the old left cerebellar stroke.  No acute abnormality.  The CTA shows a severe left carotid stenosis, greater than 80%, and no left carotid stenosis.  The intracranial vertebral artery stent is patent, although there is restenosis of the proximal end.  This is unchanged from a prior study from March 08, 2023.  His right vertebral artery is patent and there is no basilar  stenosis.   I also reviewed his brain MRI from 09/24/2023 which presently the left superior cerebellar stroke is unremarkable.   I asked him detailed questions about stroke, TIA and amaurosis fugax related to the left carotid stenosis, and I do not think he is having any symptoms.   I reviewed his medications which include aspirin , clopidogrel  and Repatha .               Past Medical History:  Diagnosis Date   Asthma     Chest pain     CHF (congestive  heart failure) (HCC)     CKD (chronic kidney disease) 11/13/2017   Coronary artery disease      a. s/p DES x2 to LAD and DES to OM in 11/2017   Depression     Dyspnea     GERD (gastroesophageal reflux disease)     Headache     History of kidney stones     History of rhabdomyolysis     Hyperlipidemia     Hypertension     Hypothyroidism     Ischemic cardiomyopathy      a. 11/2017: echo showing EF of 35-40%, diffuse HK, and Grade 2 DD   Kidney calculus 2014   Pericardial effusion      Small, by dobutamine echocardiogram, 04/2006   PONV (postoperative nausea and vomiting)     Sleep apnea      does not wear cpap   Stroke (HCC)     Tobacco abuse     Type 2 diabetes mellitus without complications (HCC) 10/07/2013   Vitamin D  deficiency                 Past Surgical History:  Procedure Laterality Date   APPENDECTOMY       BIOPSY   12/07/2022    Procedure: BIOPSY;  Surgeon: Cinderella Deatrice FALCON, MD;  Location: [facility];  Service: Endoscopy;;   CARDIAC CATHETERIZATION   09/2012    Nonobstructive CAD with 30% proximal, 40% mid LAD disease; 30% proximal CFX; EF 55-65%   CHOLECYSTECTOMY N/A 01/27/2013    Procedure: LAPAROSCOPIC CHOLECYSTECTOMY;  Surgeon: Oneil DELENA Budge, MD;  Location: [facility];  Service: General;  Laterality: N/A;   CORONARY STENT INTERVENTION N/A 11/15/2017    Procedure: CORONARY STENT INTERVENTION;  Surgeon: Anner Alm ORN, MD;  Location: [facility];  Service: Cardiovascular;  Laterality: N/A;   ESOPHAGOGASTRODUODENOSCOPY (EGD) WITH PROPOFOL  N/A 12/07/2022    Procedure: ESOPHAGOGASTRODUODENOSCOPY (EGD) WITH PROPOFOL ;  Surgeon: Cinderella Deatrice FALCON, MD;  Location: [facility];  Service: Endoscopy;  Laterality: N/A;  9:45am;asa 3   ESOPHAGOGASTRODUODENOSCOPY ENDOSCOPY   11/2022   HERNIA REPAIR        As a child   INSERTION OF MESH N/A 11/13/2012    Procedure: INSERTION OF MESH;  Surgeon: Oneil DELENA Budge, MD;  Location: [facility];  Service: General;   Laterality: N/A;   IR ANGIO INTRA EXTRACRAN SEL COM CAROTID INNOMINATE BILAT MOD SED   12/01/2017   IR ANGIO INTRA EXTRACRAN SEL COM CAROTID INNOMINATE BILAT MOD SED   02/13/2018   IR ANGIO INTRA EXTRACRAN SEL COM CAROTID INNOMINATE BILAT MOD SED   09/15/2022   IR ANGIO VERTEBRAL SEL SUBCLAVIAN INNOMINATE BILAT MOD SED   09/15/2022   IR ANGIO VERTEBRAL SEL SUBCLAVIAN INNOMINATE UNI L MOD SED   02/13/2018   IR ANGIO VERTEBRAL SEL VERTEBRAL BILAT MOD SED   12/01/2017   IR ANGIO VERTEBRAL SEL VERTEBRAL UNI R MOD  SED   02/13/2018   IR INTRA CRAN STENT   12/03/2017   IR PTA INTRACRANIAL   02/25/2018   IR RADIOLOGIST EVAL & MGMT   07/29/2021   IR US  GUIDE VASC ACCESS RIGHT   02/13/2018   KIDNEY STONE SURGERY       LEFT HEART CATH AND CORONARY ANGIOGRAPHY N/A 11/15/2017    Procedure: LEFT HEART CATH AND CORONARY ANGIOGRAPHY;  Surgeon: Anner Alm ORN, MD;  Location: [facility] INVASIVE CV LAB;  Service: Cardiovascular;  Laterality: N/A;   PERCUTANEOUS NEPHROLITHOTRIPSY       RADIOLOGY WITH ANESTHESIA Left 12/03/2017    Procedure: Angioplasty with possible stenting of left VBJ;  Surgeon: Dolphus Carrion, MD;  Location: [facility] OR;  Service: Radiology;  Laterality: Left;   RADIOLOGY WITH ANESTHESIA N/A 02/25/2018    Procedure: STENT PLACEMENT;  Surgeon: Dolphus Carrion, MD;  Location: [facility] OR;  Service: Radiology;  Laterality: N/A;   UMBILICAL HERNIA REPAIR N/A 11/13/2012    Procedure: UMBILICAL HERNIORRHAPHY;  Surgeon: Oneil DELENA Budge, MD;  Location: [facility];  Service: General;  Laterality: N/A;   VARICOCELECTOMY              Allergies: Crestor [rosuvastatin], Trulicity [dulaglutide], Bee venom, and Morphine    Medications:        Prior to Admission medications   Medication Sig Start Date End Date Taking? Authorizing Provider  aspirin  81 MG EC tablet Take 1 tablet (81 mg total) by mouth daily. With food 12/05/17     Pearlean Manus, MD  clopidogrel  (PLAVIX ) 75 MG tablet Take 1 tablet (75 mg total) by  mouth daily. 10/15/19     Johnson, Clanford L, MD  Continuous Blood Gluc Sensor (FREESTYLE LIBRE 2 SENSOR) MISC APPLY 1 PATCH TO SKIN ONCE EVERY 14 DAYS 03/27/22     Nida, Gebreselassie W, MD  empagliflozin  (JARDIANCE ) 10 MG TABS tablet TAKE 1 TABLET BY MOUTH DAILY BEFORE BREAKFAST 05/28/23     Nida, Gebreselassie W, MD  EUTHYROX  200 MCG tablet Take 200 mcg by mouth at bedtime. 05/13/20     [provider]  Evolocumab  (REPATHA  SURECLICK) 140 MG/ML SOAJ Inject 1 pen into the skin every 14 (fourteen) days. 12/24/19     Alvan Dorn FALCON, MD  ezetimibe  (ZETIA ) 10 MG tablet Take 1 tablet (10 mg total) by mouth daily. 10/15/19     Johnson, Clanford L, MD  famotidine  (PEPCID ) 40 MG tablet Take 40 mg by mouth daily. 11/23/20     [provider]  fenofibrate  (TRICOR ) 145 MG tablet Take 1 tablet (145 mg total) by mouth daily. 10/15/19     Johnson, Clanford L, MD  fluticasone  (FLONASE ) 50 MCG/ACT nasal spray Place 2 sprays into both nostrils 2 (two) times daily. 06/24/20     [provider]  furosemide  (LASIX ) 20 MG tablet Take 1 tablet (20 mg total) by mouth daily as needed. 10/19/21     Alvan Dorn FALCON, MD  gabapentin  (NEURONTIN ) 300 MG capsule One po q AM, one po q PM and 2 po qHS Patient taking differently: Take 300 mg by mouth 2 (two) times daily. 12/23/19     Sater, Charlie DELENA, MD  gemfibrozil  (LOPID ) 600 MG tablet Take 600 mg by mouth 2 (two) times daily. 10/09/23     [provider]  icosapent  Ethyl (VASCEPA ) 1 g capsule Take 2 capsules by mouth twice daily 02/06/22     Alvan Dorn FALCON, MD  LANTUS  SOLOSTAR 100 UNIT/ML Solostar Pen Inject 60 Units into  the skin daily. 01/17/23     [provider]  levothyroxine  (SYNTHROID ) 88 MCG tablet Take 88 mcg by mouth daily. 01/25/21     [provider]  lipase/protease/amylase (CREON) 36000 UNITS CPEP capsule Take 36,000 Units by mouth. Samples given - Take 3 with meals       [provider]  methocarbamol   (ROBAXIN ) 500 MG tablet Take 500 mg by mouth 3 (three) times daily as needed for muscle spasms. 02/19/23     [provider]  nitroGLYCERIN  (NITROSTAT ) 0.4 MG SL tablet Place 1 tablet (0.4 mg total) under the tongue every 5 (five) minutes as needed for chest pain. 02/14/21     Alvan Dorn FALCON, MD  NOVOLOG  FLEXPEN 100 UNIT/ML FlexPen Inject into the skin 3 (three) times daily with meals. 20 units with meals 01/17/23     [provider]  pantoprazole  (PROTONIX ) 40 MG tablet TAKE 1 TABLET BY MOUTH DAILY 08/28/23     Ahmed, Muhammad F, MD  pioglitazone (ACTOS) 15 MG tablet Take 15 mg by mouth daily. 10/09/23     [provider]  sitaGLIPtin  (JANUVIA ) 50 MG tablet Take 1 tablet (50 mg total) by mouth daily. 06/15/21     Nida, Gebreselassie W, MD  Tiotropium Bromide Monohydrate  (SPIRIVA  RESPIMAT) 1.25 MCG/ACT AERS Inhale 2 puffs into the lungs daily. 10/03/23     Wert, Michael B, MD  Tiotropium Bromide-Olodaterol (STIOLTO RESPIMAT ) 2.5-2.5 MCG/ACT AERS Inhale 2 puffs into the lungs daily. 10/03/23     [name], MD  VENTOLIN  HFA 108 (90 Base) MCG/ACT inhaler Inhale into the lungs. 11/23/20     [provider]           Family History  Problem Relation Age of Onset   Coronary artery disease Father     Hypertension Father     Hyperlipidemia Father     Diabetes Father     Congestive Heart Failure Father 62   Arrhythmia Father          had an ICD   Diabetes Mother     Hypertension Mother     Hyperlipidemia Mother     Obesity Mother 65        died after bariatric surgery, liver failure and infection   Coronary artery disease Maternal Grandfather          both grandfathers and several uncles   Coronary artery disease Other     Diabetes Sister          both sisters   Stroke Maternal Aunt     Stroke Maternal Uncle            Social History         Socioeconomic History   Marital status: Divorced      Spouse name: Not on file   Number of children: Not on  file   Years of education: Not on file   Highest education level: Not on file  Occupational History   Occupation: Disability       Comment:        Employer: [employer]  Tobacco Use   Smoking status: Every Day      Current packs/day: 1.00      Average packs/day: 1 pack/day for 38.6 years (38.6 ttl pk-yrs)      Types: Cigarettes      Start date: 03/13/1985      Passive exposure: Never   Smokeless tobacco: Former      Types: Snuff, Chew  Quit date: 03/14/1987  Vaping Use   Vaping status: Former   Devices: quit in 2011  Substance and Sexual Activity   Alcohol use: No   Drug use: No   Sexual activity: Not on file  Other Topics Concern   Not on file  Social History Narrative    Eats fast food daily    No exercise outside of work    Lives in [place] with his dog    MAINTENANCE TECHNICIAN    Caffeine use: tea/soda daily    Left handed     Social Drivers of Health        Financial Resource Strain: Not on file  Food Insecurity: Not on file  Transportation Needs: No Transportation Needs (09/07/2020)    Received from [facility]    PRAPARE - Transportation     In the past 12 months, has lack of transportation kept you from medical appointments or from getting medications?: No     Lack of Transportation (Non-Medical): No  Physical Activity: Not on file  Stress: Not on file  Social Connections: Unknown (01/06/2022)    Received from [facility]    Social Network     Social Network: Not on file      Review of Systems  Respiratory:  Positive for shortness of breath.        He has chronic COPD which is not oxygen dependent  Cardiovascular:  Negative for chest pain.  Gastrointestinal:  Negative for blood in stool.  Genitourinary:  Negative for hematuria.     Vital Signs: BP 124/79 (BP Location: Left Arm)   Pulse 73   Temp 97.9 F (36.6 C) (Oral)   Resp 16   Ht 6' 5 (1.956 m)   Wt 107 kg   SpO2 97%   BMI 27.97 kg/m    Physical Exam Constitutional:       Appearance: Normal appearance.  HENT:     Ears:     Comments: Both dorsalis pedis pulses are normal.  Radial pulses are normal. Cardiovascular:     Rate and Rhythm: Normal rate and regular rhythm.     Pulses: Normal pulses.  Neurological:     Mental Status: He is alert.     Comments: Alert and oriented with normal speech expression, fluency and comprehension. Visual fields are full to confrontation.   Face is symmetric. Strength in the arms and legs is symmetric with no drift.  Sensation is normal and symmetric. No ataxia. No inattention.        Imaging:  Imaging Results  CT ANGIO HEAD NECK W WO CM Result Date: 10/19/2023 CLINICAL DATA:  Initial evaluation for acute neuro deficit, stroke suspected. EXAM: CT ANGIOGRAPHY HEAD AND NECK WITH AND WITHOUT CONTRAST TECHNIQUE: Multidetector CT imaging of the head and neck was performed using the standard protocol during bolus administration of intravenous contrast. Multiplanar CT image reconstructions and MIPs were obtained to evaluate the vascular anatomy. Carotid stenosis measurements (when applicable) are obtained utilizing NASCET criteria, using the distal internal carotid diameter as the denominator. RADIATION DOSE REDUCTION: This exam was performed according to the departmental dose-optimization program which includes automated exposure control, adjustment of the mA and/or kV according to patient size and/or use of iterative reconstruction technique. CONTRAST:  60mL OMNIPAQUE  IOHEXOL  350 MG/ML SOLN, 60mL OMNIPAQUE  IOHEXOL  350 MG/ML SOLN COMPARISON:  CT from earlier the same day. FINDINGS: With standard branch pattern. Aortic atherosclerosis. No high-grade stenosis about the origin the great vessels. CTA NECK FINDINGS  Aortic arch: Visualized aortic arch within normal limits for caliber Right carotid system: Right common internal carotid arteries are patent without dissection. Calcified plaque about the right carotid bulb without  hemodynamically significant greater than 50% stenosis. Left carotid system: Left common and internal carotid arteries are patent without dissection. Moderate mixed plaque about the left carotid bulb with associated stenosis of up to 70% by NASCET criteria. Vertebral arteries: Both vertebral arteries arise from subclavian arteries. Atheromatous change about the origins of the vertebral arteries with associated severe left and moderate right ostial stenoses. Vertebral arteries are otherwise patent without stenosis or dissection. Skeleton: No worrisome osseous lesions. Patient is edentulous. Moderate spondylosis at C6-7. Other neck: No other acute finding. Upper chest: No other acute finding. Review of the MIP images confirms the above findings CTA HEAD FINDINGS Anterior circulation: Evaluation of the intracranial circulation limited by timing of the contrast bolus. Both internal carotid arteries patent to the termini without significant stenosis. A1 segments patent bilaterally. Normal anterior communicating artery complex. Anterior cerebral arteries patent without significant stenosis. No M1 stenosis or occlusion. Distal MCA branches perfused and symmetric. Posterior circulation: Right V4 segment patent without stenosis. Right PICA patent. Left vertebral artery patent as it crosses into the cranial vault. Left PICA not seen. Vascular stent extending from the distal left V4 segment into distal basilar artery again seen. There is a persistent focal filling defect within the distal left V4 segment just distal to the proximal landing zone of the stent (series 6, image 145). While this could reflect artifact, the persistence of this finding a relation of prior exam suggested this likely reflects chronic thrombus or possibly a small chronic dissection. Resultant severe stenosis at this level. Left PICA not seen. Basilar otherwise patent distally without stenosis. Superior cerebellar and posterior cerebral arteries patent  bilaterally. Venous sinuses: Patent allowing for timing the contrast bolus. Anatomic variants: As above.  No aneurysm. Review of the MIP images confirms the above findings IMPRESSION: 1. Vascular stent extending from the left V4 segment into the distal basilar artery. Persistent focal filling defect within the distal left V4 segment just distal to the proximal landing zone of the stent, similar to prior, and suspicious for chronic thrombus or possibly a small chronic dissection. Resultant severe stenosis at this level. Overall, appearance is relatively similar as compared to prior CTA from 03/08/2023. 2. Moderate mixed plaque about the left carotid bulb with associated stenosis of up to 70% by NASCET criteria. 3. Atheromatous change about the origins of the vertebral arteries with associated severe left and moderate right ostial stenoses. 4.  Aortic Atherosclerosis (ICD10-I70.0). Electronically Signed   By: Morene Hoard M.D.   On: 10/19/2023 20:52    CT Angio Chest PE W and/or Wo Contrast Result Date: 10/19/2023 CLINICAL DATA:  Weakness and shortness of breath, initial encounter EXAM: CT ANGIOGRAPHY CHEST WITH CONTRAST TECHNIQUE: Multidetector CT imaging of the chest was performed using the standard protocol during bolus administration of intravenous contrast. Multiplanar CT image reconstructions and MIPs were obtained to evaluate the vascular anatomy. RADIATION DOSE REDUCTION: This exam was performed according to the departmental dose-optimization program which includes automated exposure control, adjustment of the mA and/or kV according to patient size and/or use of iterative reconstruction technique. CONTRAST:  60mL OMNIPAQUE  IOHEXOL  350 MG/ML SOLN, 60mL OMNIPAQUE  IOHEXOL  350 MG/ML SOLN COMPARISON:  None Available. FINDINGS: Cardiovascular: Thoracic aorta shows a normal branching pattern. Mild atherosclerotic calcifications are seen. No dissection is identified. Heart is not significantly enlarged in  size. Diffuse coronary calcifications are seen. The pulmonary artery shows a normal branching pattern bilaterally. No definitive filling defects to suggest pulmonary embolism are noted. Mediastinum/Nodes: Thoracic inlet is within normal limits. No hilar or mediastinal adenopathy is noted. Scattered small mediastinal nodes are noted stable from the prior exam and likely reactive in nature. The esophagus as visualized is within normal limits. Lungs/Pleura: Lungs are well aerated bilaterally. Mild emphysematous changes are noted. No focal infiltrate or effusion is seen. No parenchymal nodule is noted. Upper Abdomen: Visualized upper abdomen is unremarkable. Gallbladder has been surgically removed. Musculoskeletal: Degenerative changes of the thoracic spine are noted. Review of the MIP images confirms the above findings. IMPRESSION: No evidence of pulmonary emboli. No acute abnormality is seen. Aortic Atherosclerosis (ICD10-I70.0) and Emphysema (ICD10-J43.9). Electronically Signed   By: Oneil Devonshire M.D.   On: 10/19/2023 20:16    CT Head Wo Contrast Result Date: 10/19/2023 CLINICAL DATA:  Increasing headaches EXAM: CT HEAD WITHOUT CONTRAST TECHNIQUE: Contiguous axial images were obtained from the base of the skull through the vertex without intravenous contrast. RADIATION DOSE REDUCTION: This exam was performed according to the departmental dose-optimization program which includes automated exposure control, adjustment of the mA and/or kV according to patient size and/or use of iterative reconstruction technique. 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 No acute hemorrhage, acute infarction or space-occupying mass lesion is seen. Vascular: Basilar arterial stent is noted. Skull: Normal. Negative for fracture or focal lesion. Sinuses/Orbits: No acute finding. Other: None. IMPRESSION: No acute intracranial abnormality noted.  Chronic changes as previously described. Electronically Signed   By: Oneil Devonshire M.D.   On: 10/19/2023 20:12    MR BRAIN/IAC W WO CONTRAST Result Date: 09/24/2023 CLINICAL DATA:  Vertigo EXAM: MRI HEAD WITHOUT AND WITH CONTRAST TECHNIQUE: Multiplanar, multiecho pulse sequences of the brain and surrounding structures were obtained without and with intravenous contrast. CONTRAST:  10mL GADAVIST  GADOBUTROL  1 MMOL/ML IV SOLN COMPARISON:  March 08, 2023 FINDINGS: MRI brain: The internal auditory canals are normal and symmetric. The vestibular and cochlear structures are normal and symmetric. 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 These do not have restricted diffusion or enhancement. No acute infarct. The ventricles are normal. No mass lesion. There are normal flow signals in the carotid arteries and basilar artery. No significant bone marrow signal abnormality. No significant abnormality in the paranasal sinuses or soft tissues. 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 This is unchanged from the previous MRI March 08, 2023. No other significant abnormality. Electronically Signed   By: Nancyann Burns M.D.   On: 09/24/2023 14:49       Labs:   CBC: Recent Labs (within last 365 days)        Recent Labs    03/08/23 1655 10/03/23 0946 10/19/23 1927 10/20/23 1230  WBC 8.2 8.9 6.6 6.9  HGB 16.4 16.5 16.8 15.4  HCT 49.9 51.9* 50.2 48.2  PLT 307 299 289 306        COAGS: Recent Labs (within last 365 days)      Recent Labs    03/08/23 1744 10/19/23 1927  INR 1.0 1.0  APTT 26  --         BMP: Recent Labs (within last 365 days)        Recent Labs  12/05/22 1453 03/08/23 1655 10/19/23 1927 10/20/23 1230  NA 132* 137 137 139  K 4.5 3.4* 3.6 3.8  CL 97* 101 101 103  CO2 23 27 23 26   GLUCOSE 498* 90 123* 208*  BUN 19 16 16 13   CALCIUM 9.5 9.9  9.6 9.5  CREATININE 0.88 0.95 1.10 1.24  GFRNONAA >60 >60 >60 >60        LIVER FUNCTION TESTS: Recent Labs (within last 365 days)       Recent Labs    11/07/22 1200 03/08/23 1655 10/19/23 1927  BILITOT 0.2 0.5 0.9  AST 9 15 15   ALT 9 12 13   ALKPHOS 148* 129* 77  PROT 6.7 7.5 7.4  ALBUMIN 4.2 3.8 3.8                  Electronically Signed: Nancyann LULLA Burns  10/20/2023, 1:38 PM          Electronically signed by Burns Nancyann, MD at 10/20/2023  1:57 PM

## 2023-11-05 NOTE — Brief Op Note (Signed)
  NEUROSURGERY BRIEF OPERATIVE  NOTE   PREOP DX: left ICA stenosis  POSTOP DX: Same  PROCEDURE: left ICA stent radial  SURGEON: Nancyann LULLA Burns   ANESTHESIA: IV Sedation with Local  EBL: Minimal  SPECIMENS: None  COMPLICATIONS: None  CONDITION: Stable to recovery  FINDINGS (Full report in [facility]): 1. 70% left ICA stenosis, stented   Nancyann LULLA Burns  @today @ 1:53 PM

## 2023-11-05 NOTE — Sedation Documentation (Signed)
 ACT in IR with Dr. Lester post intervention: [phone]

## 2023-11-06 ENCOUNTER — Other Ambulatory Visit (HOSPITAL_COMMUNITY): Payer: Self-pay | Admitting: Neuroradiology

## 2023-11-06 ENCOUNTER — Telehealth (INDEPENDENT_AMBULATORY_CARE_PROVIDER_SITE_OTHER): Payer: Self-pay | Admitting: Neuroradiology

## 2023-11-06 DIAGNOSIS — Z4889 Encounter for other specified surgical aftercare: Secondary | ICD-10-CM

## 2023-11-06 DIAGNOSIS — I771 Stricture of artery: Secondary | ICD-10-CM

## 2023-11-06 HISTORY — PX: IR ANGIO INTRA EXTRACRAN SEL COM CAROTID INNOMINATE UNI L MOD SED: IMG5358

## 2023-11-06 NOTE — Telephone Encounter (Signed)
 Mild neck soreness.  No other complaints.

## 2023-11-07 LAB — POCT ACTIVATED CLOTTING TIME: Activated Clotting Time: 251 s

## 2023-11-19 ENCOUNTER — Other Ambulatory Visit: Payer: Self-pay | Admitting: Acute Care

## 2023-11-19 DIAGNOSIS — F17210 Nicotine dependence, cigarettes, uncomplicated: Secondary | ICD-10-CM

## 2023-11-19 DIAGNOSIS — Z87891 Personal history of nicotine dependence: Secondary | ICD-10-CM

## 2023-11-19 DIAGNOSIS — Z122 Encounter for screening for malignant neoplasm of respiratory organs: Secondary | ICD-10-CM

## 2023-11-26 ENCOUNTER — Emergency Department (HOSPITAL_COMMUNITY)
Admission: EM | Admit: 2023-11-26 | Discharge: 2023-11-26 | Discharge status: Against Medical Advice | Source: Ambulatory Visit | Attending: Emergency Medicine | Admitting: Emergency Medicine

## 2023-11-26 ENCOUNTER — Encounter (HOSPITAL_COMMUNITY): Payer: Self-pay | Admitting: Emergency Medicine

## 2023-11-26 ENCOUNTER — Other Ambulatory Visit: Payer: Self-pay

## 2023-11-26 ENCOUNTER — Emergency Department (HOSPITAL_COMMUNITY)

## 2023-11-26 ENCOUNTER — Ambulatory Visit: Attending: Nurse Practitioner | Admitting: Nurse Practitioner

## 2023-11-26 ENCOUNTER — Encounter: Payer: Self-pay | Admitting: Nurse Practitioner

## 2023-11-26 VITALS — BP 132/72 | HR 84 | Ht 77.0 in | Wt 237.4 lb

## 2023-11-26 DIAGNOSIS — Z7982 Long term (current) use of aspirin: Secondary | ICD-10-CM | POA: Insufficient documentation

## 2023-11-26 DIAGNOSIS — Z7984 Long term (current) use of oral hypoglycemic drugs: Secondary | ICD-10-CM | POA: Insufficient documentation

## 2023-11-26 DIAGNOSIS — H5462 Unqualified visual loss, left eye, normal vision right eye: Secondary | ICD-10-CM

## 2023-11-26 DIAGNOSIS — E119 Type 2 diabetes mellitus without complications: Secondary | ICD-10-CM | POA: Insufficient documentation

## 2023-11-26 DIAGNOSIS — Z758 Other problems related to medical facilities and other health care: Secondary | ICD-10-CM

## 2023-11-26 DIAGNOSIS — I2510 Atherosclerotic heart disease of native coronary artery without angina pectoris: Secondary | ICD-10-CM | POA: Insufficient documentation

## 2023-11-26 DIAGNOSIS — R42 Dizziness and giddiness: Secondary | ICD-10-CM | POA: Diagnosis not present

## 2023-11-26 DIAGNOSIS — I779 Disorder of arteries and arterioles, unspecified: Secondary | ICD-10-CM

## 2023-11-26 DIAGNOSIS — R29898 Other symptoms and signs involving the musculoskeletal system: Secondary | ICD-10-CM | POA: Diagnosis not present

## 2023-11-26 DIAGNOSIS — Z7901 Long term (current) use of anticoagulants: Secondary | ICD-10-CM | POA: Insufficient documentation

## 2023-11-26 DIAGNOSIS — R0609 Other forms of dyspnea: Secondary | ICD-10-CM | POA: Diagnosis not present

## 2023-11-26 DIAGNOSIS — Z794 Long term (current) use of insulin: Secondary | ICD-10-CM | POA: Insufficient documentation

## 2023-11-26 DIAGNOSIS — J449 Chronic obstructive pulmonary disease, unspecified: Secondary | ICD-10-CM | POA: Insufficient documentation

## 2023-11-26 DIAGNOSIS — Z8673 Personal history of transient ischemic attack (TIA), and cerebral infarction without residual deficits: Secondary | ICD-10-CM | POA: Diagnosis not present

## 2023-11-26 DIAGNOSIS — H547 Unspecified visual loss: Secondary | ICD-10-CM | POA: Diagnosis not present

## 2023-11-26 DIAGNOSIS — E781 Pure hyperglyceridemia: Secondary | ICD-10-CM | POA: Diagnosis not present

## 2023-11-26 DIAGNOSIS — I5032 Chronic diastolic (congestive) heart failure: Secondary | ICD-10-CM

## 2023-11-26 DIAGNOSIS — E785 Hyperlipidemia, unspecified: Secondary | ICD-10-CM | POA: Diagnosis not present

## 2023-11-26 DIAGNOSIS — Z79899 Other long term (current) drug therapy: Secondary | ICD-10-CM | POA: Diagnosis not present

## 2023-11-26 LAB — DIFFERENTIAL
Abs Immature Granulocytes: 0.02 K/uL (ref 0.00–0.07)
Basophils Absolute: 0.1 K/uL (ref 0.0–0.1)
Basophils Relative: 1 %
Eosinophils Absolute: 0.5 K/uL (ref 0.0–0.5)
Eosinophils Relative: 6 %
Immature Granulocytes: 0 %
Lymphocytes Relative: 40 %
Lymphs Abs: 3.4 K/uL (ref 0.7–4.0)
Monocytes Absolute: 0.5 K/uL (ref 0.1–1.0)
Monocytes Relative: 5 %
Neutro Abs: 4.1 K/uL (ref 1.7–7.7)
Neutrophils Relative %: 48 %

## 2023-11-26 LAB — CBC
HCT: 48.6 % (ref 39.0–52.0)
Hemoglobin: 15.7 g/dL (ref 13.0–17.0)
MCH: 27.9 pg (ref 26.0–34.0)
MCHC: 32.3 g/dL (ref 30.0–36.0)
MCV: 86.5 fL (ref 80.0–100.0)
Platelets: 277 K/uL (ref 150–400)
RBC: 5.62 MIL/uL (ref 4.22–5.81)
RDW: 15.9 % — ABNORMAL HIGH (ref 11.5–15.5)
WBC: 8.6 K/uL (ref 4.0–10.5)
nRBC: 0 % (ref 0.0–0.2)

## 2023-11-26 LAB — COMPREHENSIVE METABOLIC PANEL WITH GFR
ALT: 13 U/L (ref 0–44)
AST: 12 U/L — ABNORMAL LOW (ref 15–41)
Albumin: 3.6 g/dL (ref 3.5–5.0)
Alkaline Phosphatase: 83 U/L (ref 38–126)
Anion gap: 12 (ref 5–15)
BUN: 20 mg/dL (ref 6–20)
CO2: 22 mmol/L (ref 22–32)
Calcium: 9.2 mg/dL (ref 8.9–10.3)
Chloride: 104 mmol/L (ref 98–111)
Creatinine, Ser: 1.07 mg/dL (ref 0.61–1.24)
GFR, Estimated: >60 mL/min (ref >60)
Glucose, Bld: 148 mg/dL — ABNORMAL HIGH (ref 70–99)
Potassium: 3.9 mmol/L (ref 3.5–5.1)
Sodium: 138 mmol/L (ref 135–145)
Total Bilirubin: 0.8 mg/dL (ref 0.0–1.2)
Total Protein: 7.3 g/dL (ref 6.5–8.1)

## 2023-11-26 LAB — ETHANOL: Alcohol, Ethyl (B): <15 mg/dL (ref <15)

## 2023-11-26 LAB — RAPID URINE DRUG SCREEN, HOSP PERFORMED
Amphetamines: NOT DETECTED
Barbiturates: NOT DETECTED
Benzodiazepines: NOT DETECTED
Cocaine: NOT DETECTED
Opiates: NOT DETECTED
Tetrahydrocannabinol: NOT DETECTED

## 2023-11-26 LAB — PROTIME-INR
INR: 0.9 (ref 0.8–1.2)
Prothrombin Time: 13.1 s (ref 11.4–15.2)

## 2023-11-26 LAB — APTT: aPTT: 27 s (ref 24–36)

## 2023-11-26 LAB — SEDIMENTATION RATE: Sed Rate: 8 mm/h (ref 0–20)

## 2023-11-26 MED ORDER — IOHEXOL 350 MG/ML SOLN
75.0000 mL | Freq: Once | INTRAVENOUS | Status: AC | PRN
  Administered 2023-11-26: 75 mL via INTRAVENOUS

## 2023-11-26 NOTE — ED Provider Notes (Signed)
 [place] EMERGENCY DEPARTMENT AT [facility] Provider Note   CSN: [id number] Arrival date & time: 11/26/23  1232     Patient presents with: Eye Problem   Devin [name] is a 53 y.o. male.  {Add pertinent medical, surgical, social history, OB history to HPI:32947} HPI     53 year old male with history of coronary artery disease, stroke, CHF, COPD and poorly controlled diabetes who comes in with chief complaint of left-sided vision loss.  Patient has previous history of carotid artery disease status post angioplasty in 2019 and more recently, IR put in a left-sided ICA stent (8-25)  Patient reports that around time of IR evaluation for the stent placement, he started noticing vision changes on the left side.  Over time the vision has worsened.  Now he can just count fingers, but not able to read.  Patient denies any new dizziness, one-sided weakness, numbness, slurred speech.  He does indicate that he has headache that has been present now for 2 to 3 days, around the left temple region.  Prior to Admission medications   Medication Sig Start Date End Date Taking? Authorizing Provider  aspirin  81 MG EC tablet Take 1 tablet (81 mg total) by mouth daily. With food 12/05/17   Pearlean Manus, MD  clopidogrel  (PLAVIX ) 75 MG tablet Take 1 tablet (75 mg total) by mouth daily. 10/15/19   Johnson, Clanford L, MD  Continuous Blood Gluc Sensor (FREESTYLE LIBRE 2 SENSOR) MISC APPLY 1 PATCH TO SKIN ONCE EVERY 14 DAYS 03/27/22   Nida, Gebreselassie W, MD  empagliflozin  (JARDIANCE ) 10 MG TABS tablet TAKE 1 TABLET BY MOUTH DAILY BEFORE BREAKFAST Patient not taking: Reported on 11/26/2023 05/28/23   Nida, Gebreselassie W, MD  Evolocumab  (REPATHA  SURECLICK) 140 MG/ML SOAJ Inject 1 pen into the skin every 14 (fourteen) days. 12/24/19   Alvan Dorn FALCON, MD  ezetimibe  (ZETIA ) 10 MG tablet Take 1 tablet (10 mg total) by mouth daily. 10/15/19   Johnson, Clanford L, MD  famotidine  (PEPCID ) 40 MG  tablet Take 40 mg by mouth daily. 11/23/20   [provider]  fluticasone  (FLONASE ) 50 MCG/ACT nasal spray Place 2 sprays into both nostrils 2 (two) times daily. 06/24/20   [provider]  furosemide  (LASIX ) 20 MG tablet Take 1 tablet (20 mg total) by mouth daily as needed. Patient not taking: Reported on 11/26/2023 10/19/21   Alvan Dorn FALCON, MD  gabapentin  (NEURONTIN ) 300 MG capsule One po q AM, one po q PM and 2 po qHS 12/23/19   Sater, Charlie LABOR, MD  gemfibrozil  (LOPID ) 600 MG tablet Take 600 mg by mouth 2 (two) times daily. 10/09/23   [provider]  GVOKE HYPOPEN 2-PACK 1 MG/0.2ML SOAJ as directed Subcutaneous    [provider]  icosapent  Ethyl (VASCEPA ) 1 g capsule Take 2 capsules by mouth twice daily 02/06/22   Alvan Dorn FALCON, MD  JARDIANCE  25 MG TABS tablet Take 25 mg by mouth daily. 10/22/23   [provider]  LANTUS  SOLOSTAR 100 UNIT/ML Solostar Pen Inject 70 Units into the skin at bedtime. 01/17/23   [provider]  levothyroxine  (SYNTHROID ) 125 MCG tablet Take 1 tablet (125 mcg total) by mouth daily at 6 (six) AM. 10/22/23   Elpidio Reyes DEL, MD  nitroGLYCERIN  (NITROSTAT ) 0.4 MG SL tablet Place 1 tablet (0.4 mg total) under the tongue every 5 (five) minutes as needed for chest pain. 02/14/21   Alvan Dorn FALCON, MD  NOVOLOG  FLEXPEN 100 UNIT/ML FlexPen  Inject 2 Units into the skin 3 (three) times daily as needed for high blood sugar. Patient taking differently: Inject 2 Units into the skin 3 (three) times daily as needed for high blood sugar. Patient takes 2-5 units as needed 01/17/23   [provider]  pantoprazole  (PROTONIX ) 40 MG tablet TAKE 1 TABLET BY MOUTH DAILY 08/28/23   Ahmed, Muhammad F, MD  pioglitazone (ACTOS) 15 MG tablet Take 15 mg by mouth daily. 10/09/23   [provider]  Tiotropium Bromide-Olodaterol (STIOLTO RESPIMAT ) 2.5-2.5 MCG/ACT AERS Inhale 2 puffs into the lungs daily. 10/03/23   [name], MD  VENTOLIN  HFA 108 (90 Base) MCG/ACT inhaler Inhale 1-2 puffs into the lungs every 6 (six) hours as needed for wheezing or shortness of breath. Patient not taking: Reported on 11/26/2023 11/23/20   [provider]    Allergies: Crestor [rosuvastatin], Trulicity [dulaglutide], Bee venom, and Morphine     Review of Systems  All other systems reviewed and are negative.   Updated Vital Signs BP (!) 136/92 (BP Location: Right Arm)   Pulse 77   Temp 98.2 F (36.8 C) (Oral)   Resp 20   Ht 6' 5 (1.956 m)   Wt 108 kg   SpO2 98%   BMI 28.23 kg/m   Physical Exam Vitals and nursing note reviewed.  Constitutional:      Appearance: He is well-developed.  HENT:     Head: Normocephalic and atraumatic.  Eyes:     Extraocular Movements: Extraocular movements intact.     Pupils: Pupils are equal, round, and reactive to light.     Comments: Patient is able to properly finger count on the left side, but he reports significant loss of vision compared to his baseline normal from few months back.  Peripheral vision is impaired in all 4 quadrants  Cardiovascular:     Rate and Rhythm: Normal rate.  Pulmonary:     Effort: Pulmonary effort is normal.  Musculoskeletal:     Cervical back: Neck supple.  Skin:    General: Skin is warm.  Neurological:     Mental Status: He is alert and oriented to person, place, and time.     Cranial Nerves: No cranial nerve deficit.     Sensory: No sensory deficit.     Motor: No weakness.     Coordination: Coordination abnormal.     Gait: Gait abnormal.     Comments: Patient had dysmetria with left finger-to-nose and upon ambulation, it appeared that he was favoring more to the right side     (all labs ordered are listed, but only abnormal results are displayed) Labs Reviewed  PROTIME-INR  APTT  CBC  DIFFERENTIAL  COMPREHENSIVE METABOLIC PANEL WITH GFR  RAPID URINE DRUG SCREEN, HOSP PERFORMED  ETHANOL  SEDIMENTATION RATE     EKG: None  Radiology: No results found.  {Document cardiac monitor, telemetry assessment procedure when appropriate:32947} Procedures   Medications Ordered in the ED - No data to display  Clinical Course as of 11/26/23 1545  Mon Nov 26, 2023  1535 Handoff AN 52 yo/m hx cva, dm, carotid endarterectomy recent Here w/ left vision loss x1 month shortly after procedure > initially blurry, progressive (can finger count) MRI brain w/o pending If neg CTA head/neck Neuro consult > ophth consult [SG]    Clinical Course User Index [SG] Elnor Jayson LABOR, DO   {Click here for ABCD2, HEART and other calculators REFRESH Note before signing:1}  Medical Decision Making Amount and/or Complexity of Data Reviewed Labs: ordered. Radiology: ordered.   53 year old male with history of CAD, CHF, CVA, recent left ICA stent placement comes in with chief complaint of subacute vision loss through the left eye.  On exam, patient has peripheral vision loss, grossly worsened vision on the left eye and also dysmetria with left upper extremity and ataxia.  I have reviewed patient's record including recent discharge summary and also IR note from 8-25.  Differential diagnosis for this patient includes acute stroke, subacute stroke, thromboembolic event, CRAO, CRVO, temporal arteritis.  Plan is to get basic labs and MRI.  If the workup is negative, then we will consult neurology to see if patient will need CT angiogram head and neck or just needs admission to the hospital for further assessment for embolic event including CRAO-CRVO.  Final diagnoses:  None    ED Discharge Orders     None

## 2023-11-26 NOTE — ED Provider Notes (Signed)
 D Provider Note MRN:  [id number]  Arrival date & time: 11/26/23    ED Course and Medical Decision Making  Assumed care from Dr Charlyn at shift change.  See note from prior team for complete details, in brief:  Clinical Course as of 11/26/23 2017  Mon Nov 26, 2023  1535 Handoff AN 52 yo/m hx cva, dm, carotid endarterectomy recent Here w/ left vision loss x1 month progressive since endartectomy but was present before procedure per note > initially blurry, progressive (can finger count) MRI brain w/o pending If neg CTA head/neck Neuro consult > ophth consult [SG]  2000 CTA reviewed  Spoke w/ teleneuro, no acute changes from neuro perspective, likely needs to be on high intensity statin, continue DAPT. Recommend ophtho f/u [SG]    Clinical Course User Index [SG] Elnor Jayson LABOR, DO   Spoke with Dr. Germaine with neurology, see recommendations as above Spoke with Dr. Regenia with ophthalmology, recommends follow-up in his office tomorrow at 9:30 AM. I went to reassess the patient and he unfortunately has eloped I was able to speak with his Sister Nathanel which is also his emergency contact who is authorized to receive medical information and relayed information regarding ophthalmology appointment time for tomorrow. Pt did not answer calls to his personal cell phone.  I was not able to speak with the patient      Procedures  Final Clinical Impressions(s) / ED Diagnoses     ICD-10-CM   1. Vision loss of left eye  H54.62       ED Discharge Orders     None       Discharge Instructions   None        Elnor Jayson LABOR, DO 11/26/23 2017

## 2023-11-26 NOTE — Progress Notes (Unsigned)
 Cardiology Office Note:  .   Date: 11/26/2023 ID:  Devin [name], DOB 1970-07-06, MRN [id number] PCP: Trudy Vaughn FALCON, MD  [facility] Providers Cardiologist:  Alvan Carrier, MD    History of Present Illness: .   Devin [name] is a 53 y.o. male with a PMH of CAD, CHF, chest pain, COPD, DOE, HLD, hx of CVA ,s/p image-guided cerebral angiogram with revascularization of vertebrobasilar junction/proximal basilar artery stenosis using stent assisted angioplasty in 2019 by IR, later on had ISR requiring repeat procedure, who presents today for 3 month follow-up.   History of 2 drug-eluting stents to LAD in 2019, DES to OM, trifurcation lesion distal to left circumflex, PDA and LPL Rex for medical therapy, with poor PCI target.  RCA was found to be a small vessel, recommended be treated medically.  Interventional cardiology recommended extended DAPT.  EF in 2019 35 to 40%, repeat EF in 2021 50 to 55%.  Last seen by Dr. Carrier Alvan on February 20, 2023.  He admitted to recent shortness of breath/dyspnea on exertion.  Denied any chest pain.  Echocardiogram was arranged.  Echo revealed normal LVEF, grade 1 DD.  Lexiscan  was arranged in February 2025 and revealed no evidence of ischemia or infarction, study felt to be low risk.  PFTs were arranged -revealed some evidence of COPD.  He was referred to pulmonology for further evaluation.  06/25/2023 - Today presents for 40-month follow-up.  He admits to stable dyspnea on exertion, wonders if this is related to progression of his COPD.  Conversely he has not seen a pulmonologist yet.  Admits to chronic, intermittent dizziness that he has had ever since his strokes, confirms also hx of TIA's in the past.  Admits to random episodes of dizziness.  For example, while watching TV he admits to spinning sensation.  Says he has been taking meclizine  consistently, says this medicine does not work.  Says he has been told he has vertigo, however he disagrees  with this.  In addition to dyspnea on exertion, he also admits to palpitations and feeling like his heart rate is beating up with walking distances. Denies any syncope, presyncope, orthopnea, PND, swelling or significant weight changes, acute bleeding, or claudication.  08/21/2023 - Here for follow-up. Admits to stable symptoms and random episodes of dizziness, also admits to nystagmus. Expresses frustration in primary care. Denies any chest pain, syncope, presyncope, orthopnea, PND, swelling or significant weight changes, acute bleeding, or claudication. Says he does note some palpitations at times, didn't notice this as much while wearing the monitor.   Hospitalized August 2025 for LOC, couldn't think straight. Syncopal episode with nausea and vomiting. Echo ordered and benign. See caroitd workup noted below. Cardiogenic syncope ruled out. Referred to f/u with Neurosurgery outpatient.   Today he is here for follow-up. He tells me he underwent percutaneous carotid stenting and TCAR along the left internal carotid artery around August 2025.  He says he is unsure when, but around the time of the surgery he notes more blurry vision along his left eye, has been worse over time and says he is losing depth perception and losing perception of different colors.  Does admit to some symptoms of what sounds like vertigo, denies any syncope.  Overall doing well from a cardiac perspective.  Does admit to legs and hips feeling weak and giving out. Denies any chest pain, shortness of breath, palpitations, syncope, presyncope, orthopnea, PND, swelling or significant weight changes, acute bleeding, or claudication.  ROS: Negative. See HPI.   Studies Reviewed: SABRA    EKG: EKG is not ordered today.      Carotid duplex 07/2023:  Summary:  Right Carotid: Velocities in the right ICA are consistent with a 1-39%  stenosis. Non-hemodynamically significant plaque <50% noted in the  CCA. The ECA appears <50% stenosed.    Left Carotid: Velocities in the left ICA are consistent with a 40-59%  stenosis. Non-hemodynamically significant plaque <50% noted in the  CCA. The ECA appears >50% stenosed.   Vertebrals:  Bilateral vertebral arteries demonstrate antegrade flow.  Subclavians: Normal flow hemodynamics were seen in bilateral subclavian arteries.   *See table(s) above for measurements and observations.  Suggest follow up study in 12 months.  Cardiac monitor 07/2023:    3 day monitor   Rare supraventricular in the form of isolated PACs, couplets.   Rare ventricular ectopy in the form of isolated PVCs, couplets, triplets.   Reported symptoms correlated with sinus tachycardia 101 to 125 bpm.   No signifiant arrhythmias     Patch Wear Time:  3 days and 2 hours (2025-04-14T16:48:50-0400 to 2025-04-17T19:36:53-0400)   Patient had a min HR of 50 bpm, max HR of 133 bpm, and avg HR of 82 bpm. Predominant underlying rhythm was Sinus Rhythm. Isolated SVEs were rare (<1.0%), SVE Couplets were rare (<1.0%), and no SVE Triplets were present. Isolated VEs were rare (<1.0%,  10), VE Couplets were rare (<1.0%, 1), and VE Triplets were rare (<1.0%, 1). Lexiscan  04/2023:    Stress ECG is negative for ischemia and arrhythmias.   LV perfusion is normal. There is no evidence of ischemia. There is no evidence of infarction.   Left ventricular function is normal. Nuclear stress EF: 55%. End diastolic cavity size is moderately enlarged. End systolic cavity size is normal.   Findings are consistent with no ischemia and no infarction. The study is low risk.       Echo 02/2023:  1. Left ventricular ejection fraction, by estimation, is 50%. The left  ventricle has low normal function. The left ventricle has no regional wall  motion abnormalities. There is moderate concentric left ventricular  hypertrophy. Left ventricular diastolic   parameters are consistent with Grade I diastolic dysfunction (impaired  relaxation).   2.  Right ventricular systolic function is normal. The right ventricular  size is normal. Tricuspid regurgitation signal is inadequate for assessing  PA pressure.   3. The mitral valve is normal in structure. Trivial mitral valve  regurgitation. No evidence of mitral stenosis.   4. The aortic valve has an indeterminant number of cusps. Aortic valve  regurgitation is not visualized. No aortic stenosis is present.   5. The inferior vena cava is normal in size with greater than 50%  respiratory variability, suggesting right atrial pressure of 3 mmHg.   Comparison(s): No significant change from prior study.  ABI's 11/2019:  Right ABI: 1.43, Left ABI: 1.32  LHC 11/2017: There is moderate left ventricular systolic dysfunction. The left ventricular ejection fraction is 35-45% by visual estimate. LV end diastolic pressure is mildly elevated. ----------------------------------------- ANGIOGRAPHY- PCI LESION #1: Mid LAD lesion is 80% stenosed. A drug-eluting stent was successfully placed using a STENT SYNERGY DES 2.25X16. -Postdilated to 2.4 mm Post intervention, there is a 0% residual stenosis. LESION #2: mid LAD to Dist LAD lesion is 90% stenosed. A drug-eluting stent was successfully placed using a STENT SYNERGY DES 2.25X12. -Postdilated to 2.4 mm Post intervention, there is a 0% residual stenosis. Prox  LAD lesion is 40% stenosed -prior to lesion 1; apical, after lesion 2 A drug-eluting stent was successfully placed using a STENT SYNERGY DES 2.25X12. -Postdilated to 2.4 mm ----------------------------------------- Post intervention, there is a 0% residual stenosis. Trifurcation 90% lesion at the distal circumflex into LPL and PDA -small caliber distal vessels. Best treated medically as this is not a very good PCI target Prox small caliber nondominant RCA lesion is 99% stenosed.   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   Subtotally occluded nondominant RCA Moderately reduced LVEF of roughly 40% with mildly elevated LVEDP.   Plan: Transfer to postprocedure unit for post PCI monitoring and TR removal. Continue aggressive risk factor modification with statin and beta-blocker etc.   Recommend uninterrupted dual antiplatelet therapy with Aspirin  81mg  daily and Ticagrelor  90mg  twice daily for a minimum of 12 months (ACS - Class I recommendation). -Would strongly consider reducing to 60 mg dose ticagrelor  at 9-12 months and continue for an additional year.   Would be okay to stop aspirin  after 3 months if necessary.  Okay to stop Brilinta  after 6 months for procedures if necessary.   Physical Exam:   VS:  BP 132/72 (BP Location: Left Arm)   Pulse 84   Ht 6' 5 (1.956 m)   Wt 237 lb 6.4 oz (107.7 kg)   SpO2 98%   BMI 28.15 kg/m    Wt Readings from Last 3 Encounters:  11/26/23 237 lb 6.4 oz (107.7 kg)  11/05/23 238 lb (108 kg)  10/25/23 234 lb 9.6 oz (106.4 kg)    GEN: Well nourished, well developed in no acute distress NECK: No JVD; No carotid bruits CARDIAC: S1/S2, RRR, no murmurs, rubs, gallops RESPIRATORY:  Clear to auscultation without rales, wheezing or rhonchi  ABDOMEN: Soft, non-tender, non-distended EXTREMITIES:  No edema; No deformity   ASSESSMENT AND PLAN: .    CAD Denies any chest pain, but admits to DOE and symptoms of COPD. Reviewed and discussed recent normal stress test and unremarkable Echo. PFT's were arranged that revealed some evidence of COPD.  Continue current medication regimen. Heart healthy diet and regular cardiovascular exercise encouraged. Care and ED precautions discussed.   HFpEF Stage C, NYHA class I-II symptoms. ICM. Recent Echo showed normal LVEF. Euvolemic and well compensated on exam. Continue current GDMT. GDMT limited by chronic dizziness - see below. Low sodium diet, fluid restriction <2L, and daily  weights encouraged. Educated to contact our office for weight gain of 2 lbs overnight or 5 lbs in one week.   HLD, hypertriglcyeridemia Most recent LDL 128. TC 230, TG 429. Being managed by PCP, he has deferred this to his PCP instead of lipid clinic. Continue current medication regimen. Heart healthy diet and regular cardiovascular exercise encouraged.  He is also following Meagan Younts, NP who is an endocrinology NP.  Hx of CVA's/TIA's carotid artery disease, s/p percutaneous carotid stenting and TCAR, chronic dizziness, leg weakness Seems to have chronic dizziness that is a deficit since his past strokes/TIAs.  Possible vertigo related. No symptoms of orthostatic dizziness. Continue to follow with PCP.  Care and ED precautions discussed.  Continue follow-up with neurosurgery and PCP. Will arrange ABI's.   COPD, DOE Denies any worsening symptoms. Most recent cardiac workup reasurring.  Recent PFT showed some evidence of COPD.  Follow-up with pulmonology as scheduled.  Continue  follow-up with PCP.  No medication changes at this time.  Care and ED precautions discussed.  7. Needs a PCP/ Does not have a PCP Will refer him to PCP.   8. Acute vision loss He has not seen eye doctor or ophthalmologist with his recent acute vision loss.  Denies any eye pain or strokelike symptoms.  I did discuss ED evaluation immediately.  He verbalized understanding.  Will refer him to ophthalmologist with [facility].  I spent a total duration of 45 minutes reviewing prior notes, reviewing outside records including  labs, face-to-face counseling of medical condition, pathophysiology, evaluation, management, and documenting the findings in the note.    Dispo: Follow-up with MD/APP in 4-6 months or sooner if any changes.  Signed, Almarie Crate, NP

## 2023-11-26 NOTE — ED Triage Notes (Signed)
 Pt has been having vision loss in his left eye x1 month. Also c/o of pressure behind the eye. Pt went to his heart doctor today who sent him here.

## 2023-11-26 NOTE — Patient Instructions (Addendum)
 Medication Instructions:  Your physician recommends that you continue on your current medications as directed. Please refer to the Current Medication list given to you today.  Labwork: None   Testing/Procedures: Your physician has requested that you have an ankle brachial index (ABI). During this test an ultrasound and blood pressure cuff are used to evaluate the arteries that supply the arms and legs with blood. Allow thirty minutes for this exam. There are no restrictions or special instructions.  Please note: We ask at that you not bring children with you during ultrasound (echo/ vascular) testing. Due to room size and safety concerns, children are not allowed in the ultrasound rooms during exams. Our front office staff cannot provide observation of children in our lobby area while testing is being conducted. An adult accompanying a patient to their appointment will only be allowed in the ultrasound room at the discretion of the ultrasound technician under special circumstances. We apologize for any inconvenience.  Follow-Up: Your physician recommends that you schedule a follow-up appointment in:  4-6 Months  Please go to [facility] ED today for acute vision loss in the left eye.   Any Other Special Instructions Will Be Listed Below (If Applicable).  If you need a refill on your cardiac medications before your next appointment, please call your pharmacy.

## 2023-11-27 DIAGNOSIS — H25812 Combined forms of age-related cataract, left eye: Secondary | ICD-10-CM | POA: Diagnosis not present

## 2023-11-28 ENCOUNTER — Ambulatory Visit: Attending: Nurse Practitioner

## 2023-11-28 DIAGNOSIS — R29898 Other symptoms and signs involving the musculoskeletal system: Secondary | ICD-10-CM

## 2023-11-28 DIAGNOSIS — I2510 Atherosclerotic heart disease of native coronary artery without angina pectoris: Secondary | ICD-10-CM | POA: Diagnosis not present

## 2023-11-29 LAB — VAS US ABI WITH/WO TBI
Left ABI: 1.05
Right ABI: 1.19

## 2023-12-02 ENCOUNTER — Ambulatory Visit: Payer: Self-pay | Admitting: Nurse Practitioner

## 2023-12-11 DIAGNOSIS — H25811 Combined forms of age-related cataract, right eye: Secondary | ICD-10-CM | POA: Diagnosis not present

## 2023-12-11 DIAGNOSIS — H25812 Combined forms of age-related cataract, left eye: Secondary | ICD-10-CM | POA: Diagnosis not present

## 2023-12-25 DIAGNOSIS — E1165 Type 2 diabetes mellitus with hyperglycemia: Secondary | ICD-10-CM | POA: Diagnosis not present

## 2023-12-25 DIAGNOSIS — E039 Hypothyroidism, unspecified: Secondary | ICD-10-CM | POA: Diagnosis not present

## 2023-12-26 ENCOUNTER — Encounter (INDEPENDENT_AMBULATORY_CARE_PROVIDER_SITE_OTHER): Payer: Self-pay | Admitting: Gastroenterology

## 2023-12-27 ENCOUNTER — Other Ambulatory Visit (INDEPENDENT_AMBULATORY_CARE_PROVIDER_SITE_OTHER): Payer: Self-pay | Admitting: Gastroenterology

## 2023-12-31 DIAGNOSIS — H25812 Combined forms of age-related cataract, left eye: Secondary | ICD-10-CM | POA: Diagnosis not present

## 2023-12-31 DIAGNOSIS — E1136 Type 2 diabetes mellitus with diabetic cataract: Secondary | ICD-10-CM | POA: Diagnosis not present

## 2023-12-31 DIAGNOSIS — I10 Essential (primary) hypertension: Secondary | ICD-10-CM | POA: Diagnosis not present

## 2023-12-31 DIAGNOSIS — E039 Hypothyroidism, unspecified: Secondary | ICD-10-CM | POA: Diagnosis not present

## 2024-01-03 ENCOUNTER — Ambulatory Visit: Admitting: Internal Medicine

## 2024-01-03 DIAGNOSIS — J449 Chronic obstructive pulmonary disease, unspecified: Secondary | ICD-10-CM

## 2024-01-03 NOTE — Progress Notes (Deleted)
 Devin [name], male    DOB: 25-Apr-1970    MRN: [id number]   Brief patient profile:  89  yowm  active smoker/MM  with freq rhinitis with change of seasons  referred to pulmonary clinic in [place]  10/03/2023 by Dr Lionell  for doe onset since 33s.   PFT's 05/28/23 FEV1 3.36 (69 % ) ratio 0.67 p 2 % improvement from saba p ? prior to study with DLCO 25.5 (71%) and FV curve atypical: straightened but not flattened    History of Present Illness  10/03/2023  Pulmonary/ 1st office eval/ Devin [name] / [name]  Chief Complaint  Patient presents with   Establish Care  Dyspnea:   7 steps to landing sometimes stops half way Flat to mb stops half way x months x 75 ft (not reproduced at ov) Also sob with voice use  Cough: sometimes after dinner in a recliner min mucoid  Sleep: 45 degrees recliner - any lower coughs  SABA use: 4x daily doesn't think it's helping  02: none  Rec Spiriva  4 puffs each am  When it's used up then start Stiolto 2 puffs each am - think of it like high octane fuel  Use your albuterol  as a rescue medication Also  Ok to try albuterol  15 min before an activity (on alternating days)  that you know would usually make you short of breath The key is to stop smoking completely before smoking completely stops you! Allergy screen 10/03/2023 >  Eos 0.5   alpha one AT phenotype MM / level 189     Please schedule a follow up visit in 3 months but call sooner if needed  with all respiratory medications /inhalers    LDSCt  8/220/25 RADS  2 with CAD and emphysema  01/03/2024  f/u ov/[place] office/Devin [name] re: GOLD 2 COPD maint on ***  *** smoker  did *** bring meds  No chief complaint on file.   Dyspnea:  *** Cough: *** Sleeping: ***   resp cc  SABA use: *** 02: ***  Lung cancer screening: ***   No obvious day to day or daytime variability or assoc excess/ purulent sputum or mucus plugs or hemoptysis or cp or chest tightness, subjective wheeze or overt sinus or hb  symptoms.    Also denies any obvious fluctuation of symptoms with weather or environmental changes or other aggravating or alleviating factors except as outlined above   No unusual exposure hx or h/o childhood pna/ asthma or knowledge of premature birth.  Current Allergies, Complete Past Medical History, Past Surgical History, Family History, and Social History were reviewed in [employer] record.  ROS  The following are not active complaints unless bolded Hoarseness, sore throat, dysphagia, dental problems, itching, sneezing,  nasal congestion or discharge of excess mucus or purulent secretions, ear ache,   fever, chills, sweats, unintended wt loss or wt gain, classically pleuritic or exertional cp,  orthopnea pnd or arm/hand swelling  or leg swelling, presyncope, palpitations, abdominal pain, anorexia, nausea, vomiting, diarrhea  or change in bowel habits or change in bladder habits, change in stools or change in urine, dysuria, hematuria,  rash, arthralgias, visual complaints, headache, numbness, weakness or ataxia or problems with walking or coordination,  change in mood or  memory.        No outpatient medications have been marked as taking for the 01/03/24 encounter (Appointment) with Devin Ozell NOVAK, MD.        Past Medical History:  Diagnosis  Date   Asthma    Chest pain    CHF (congestive heart failure) (HCC)    CKD (chronic kidney disease) 11/13/2017   Coronary artery disease    a. s/p DES x2 to LAD and DES to OM in 11/2017   Depression    Dyspnea    GERD (gastroesophageal reflux disease)    Headache    History of kidney stones    History of rhabdomyolysis    Hyperlipidemia    Hypertension    Hypothyroidism    Ischemic cardiomyopathy    a. 11/2017: echo showing EF of 35-40%, diffuse HK, and Grade 2 DD   Kidney calculus 2014   Pericardial effusion    Small, by dobutamine echocardiogram, 04/2006   PONV (postoperative nausea and vomiting)    Sleep  apnea    does not wear cpap   Stroke (HCC)    Tobacco abuse    Type 2 diabetes mellitus without complications (HCC) 10/07/2013   Vitamin D  deficiency       Objective:     Wt Readings from Last 3 Encounters:  11/26/23 238 lb 1.6 oz (108 kg)  11/26/23 237 lb 6.4 oz (107.7 kg)  11/05/23 238 lb (108 kg)    Vital signs reviewed  01/03/2024  - Note at rest 02 sats  ***% on ***   General appearance:    ***  M3-4 Airway   Min bar***        Assessment

## 2024-01-14 DIAGNOSIS — H25811 Combined forms of age-related cataract, right eye: Secondary | ICD-10-CM | POA: Diagnosis not present

## 2024-02-13 ENCOUNTER — Other Ambulatory Visit (INDEPENDENT_AMBULATORY_CARE_PROVIDER_SITE_OTHER): Payer: Self-pay | Admitting: Gastroenterology

## 2024-02-25 ENCOUNTER — Other Ambulatory Visit (INDEPENDENT_AMBULATORY_CARE_PROVIDER_SITE_OTHER): Payer: Self-pay | Admitting: Gastroenterology

## 2024-03-27 ENCOUNTER — Ambulatory Visit: Attending: Nurse Practitioner | Admitting: Nurse Practitioner

## 2024-03-27 ENCOUNTER — Encounter: Payer: Self-pay | Admitting: Nurse Practitioner

## 2024-03-27 VITALS — BP 122/70 | HR 71 | Ht 77.0 in | Wt 233.4 lb

## 2024-03-27 DIAGNOSIS — R29898 Other symptoms and signs involving the musculoskeletal system: Secondary | ICD-10-CM

## 2024-03-27 DIAGNOSIS — J449 Chronic obstructive pulmonary disease, unspecified: Secondary | ICD-10-CM | POA: Diagnosis not present

## 2024-03-27 DIAGNOSIS — I5032 Chronic diastolic (congestive) heart failure: Secondary | ICD-10-CM

## 2024-03-27 DIAGNOSIS — R0609 Other forms of dyspnea: Secondary | ICD-10-CM

## 2024-03-27 DIAGNOSIS — Z8673 Personal history of transient ischemic attack (TIA), and cerebral infarction without residual deficits: Secondary | ICD-10-CM

## 2024-03-27 DIAGNOSIS — E781 Pure hyperglyceridemia: Secondary | ICD-10-CM

## 2024-03-27 DIAGNOSIS — E785 Hyperlipidemia, unspecified: Secondary | ICD-10-CM

## 2024-03-27 DIAGNOSIS — R252 Cramp and spasm: Secondary | ICD-10-CM | POA: Diagnosis not present

## 2024-03-27 DIAGNOSIS — I779 Disorder of arteries and arterioles, unspecified: Secondary | ICD-10-CM | POA: Diagnosis not present

## 2024-03-27 DIAGNOSIS — I2510 Atherosclerotic heart disease of native coronary artery without angina pectoris: Secondary | ICD-10-CM

## 2024-03-27 DIAGNOSIS — R42 Dizziness and giddiness: Secondary | ICD-10-CM | POA: Diagnosis not present

## 2024-03-27 NOTE — Patient Instructions (Signed)

## 2024-03-27 NOTE — Progress Notes (Signed)
 " Cardiology Office Note:  .   Date: 03/27/2024 ID:  Devin [name], DOB 05/17/1970, MRN [id number] PCP: Trudy Vaughn FALCON, MD  [facility] Providers Cardiologist:  Alvan Carrier, MD    History of Present Illness: .   Devin [name] is a 54 y.o. male with a PMH of CAD, CHF, chest pain, COPD, DOE, HLD, hx of CVA ,s/p image-guided cerebral angiogram with revascularization of vertebrobasilar junction/proximal basilar artery stenosis using stent assisted angioplasty in 2019 by IR, later on had ISR requiring repeat procedure, who presents today for 4-6 month follow-up.     History of 2 drug-eluting stents to LAD in 2019, DES to OM, trifurcation lesion distal to left circumflex, PDA and LPL Rex for medical therapy, with poor PCI target.  RCA was found to be a small vessel, recommended be treated medically.  Interventional cardiology recommended extended DAPT.  EF in 2019 35 to 40%, repeat EF in 2021 50 to 55%.  Last seen by Dr. Carrier Alvan on February 20, 2023.  He admitted to recent shortness of breath/dyspnea on exertion.  Denied any chest pain.  Echocardiogram was arranged.  Echo revealed normal LVEF, grade 1 DD.  Lexiscan  was arranged in February 2025 and revealed no evidence of ischemia or infarction, study felt to be low risk.  PFTs were arranged -revealed some evidence of COPD.  He was referred to pulmonology for further evaluation.  06/25/2023 - Today presents for 68-month follow-up.  He admits to stable dyspnea on exertion, wonders if this is related to progression of his COPD.  Conversely he has not seen a pulmonologist yet.  Admits to chronic, intermittent dizziness that he has had ever since his strokes, confirms also hx of TIA's in the past.  Admits to random episodes of dizziness.  For example, while watching TV he admits to spinning sensation.  Says he has been taking meclizine  consistently, says this medicine does not work.  Says he has been told he has vertigo, however he  disagrees with this.  In addition to dyspnea on exertion, he also admits to palpitations and feeling like his heart rate is beating up with walking distances. Denies any syncope, presyncope, orthopnea, PND, swelling or significant weight changes, acute bleeding, or claudication.  08/21/2023 - Here for follow-up. Admits to stable symptoms and random episodes of dizziness, also admits to nystagmus. Expresses frustration in primary care. Denies any chest pain, syncope, presyncope, orthopnea, PND, swelling or significant weight changes, acute bleeding, or claudication. Says he does note some palpitations at times, didn't notice this as much while wearing the monitor.   Hospitalized August 2025 for LOC, couldn't think straight. Syncopal episode with nausea and vomiting. Echo ordered and benign. See caroitd workup noted below. Cardiogenic syncope ruled out. Referred to f/u with Neurosurgery outpatient.   11/26/2023 - Today he is here for follow-up. He tells me he underwent percutaneous carotid stenting and TCAR along the left internal carotid artery around August 2025.  He says he is unsure when, but around the time of the surgery he notes more blurry vision along his left eye, has been worse over time and says he is losing depth perception and losing perception of different colors.  Does admit to some symptoms of what sounds like vertigo, denies any syncope.  Overall doing well from a cardiac perspective.  Does admit to legs and hips feeling weak and giving out. Denies any chest pain, shortness of breath, palpitations, syncope, presyncope, orthopnea, PND, swelling or significant weight changes,  acute bleeding, or claudication.  03/27/2024 - Here for follow-up. He tells me he has officially quit smoking nicotine  but is now vaping. Just had recent blood work done with his Endo NP.  Says he will also be getting established with this practice for primary care as well. Following an ophthalmologist, no recent vision  changes. Continues to note chronic dizziness. Does admit to some leg cramping sensation at night. Denies any chest pain, palpitations, syncope, presyncope, orthopnea, PND, swelling or significant weight changes, acute bleeding, or claudication.  ROS: Negative. See HPI.   Studies Reviewed: SABRA    EKG: EKG is not ordered today.  Echo 10/2023: 1. Left ventricular ejection fraction, by estimation, is 55 to 60%. The  left ventricle has normal function. The left ventricle has no regional  wall motion abnormalities. There is mild left ventricular hypertrophy.  Left ventricular diastolic parameters  were normal. The average left ventricular global longitudinal strain is  -16.4 %. The global longitudinal strain is normal.   2. Right ventricular systolic function is normal. The right ventricular  size is normal. Tricuspid regurgitation signal is inadequate for assessing  PA pressure.   3. The mitral valve is normal in structure. Trivial mitral valve  regurgitation. No evidence of mitral stenosis.   4. The aortic valve was not well visualized. Aortic valve regurgitation  is not visualized. No aortic stenosis is present.   5. The inferior vena cava is normal in size with greater than 50%  respiratory variability, suggesting right atrial pressure of 3 mmHg.   Comparison(s): No significant change from prior study.  Carotid duplex 10/2023:   Summary:  Right Carotid: Velocities in the right ICA are consistent with a 1-39%  stenosis.   Left Carotid: Velocities in the left ICA are consistent with a 80-99%  stenosis.               The ECA appears >50% stenosed. Stenosis noted at  bulb/proximal               ICA.   Vertebrals:  Bilateral vertebral arteries demonstrate antegrade flow.  Subclavians: Normal flow hemodynamics were seen in bilateral subclavian               arteries.   *See table(s) above for measurements and observations.  Carotid duplex 07/2023:  Summary:  Right Carotid: Velocities  in the right ICA are consistent with a 1-39%  stenosis. Non-hemodynamically significant plaque <50% noted in the  CCA. The ECA appears <50% stenosed.   Left Carotid: Velocities in the left ICA are consistent with a 40-59%  stenosis. Non-hemodynamically significant plaque <50% noted in the  CCA. The ECA appears >50% stenosed.   Vertebrals:  Bilateral vertebral arteries demonstrate antegrade flow.  Subclavians: Normal flow hemodynamics were seen in bilateral subclavian arteries.   *See table(s) above for measurements and observations.  Suggest follow up study in 12 months.  Cardiac monitor 07/2023:    3 day monitor   Rare supraventricular in the form of isolated PACs, couplets.   Rare ventricular ectopy in the form of isolated PVCs, couplets, triplets.   Reported symptoms correlated with sinus tachycardia 101 to 125 bpm.   No signifiant arrhythmias     Patch Wear Time:  3 days and 2 hours (2025-04-14T16:48:50-0400 to 2025-04-17T19:36:53-0400)   Patient had a min HR of 50 bpm, max HR of 133 bpm, and avg HR of 82 bpm. Predominant underlying rhythm was Sinus Rhythm. Isolated SVEs were rare (<1.0%), SVE Couplets were rare (<  1.0%), and no SVE Triplets were present. Isolated VEs were rare (<1.0%,  10), VE Couplets were rare (<1.0%, 1), and VE Triplets were rare (<1.0%, 1). Lexiscan  04/2023:    Stress ECG is negative for ischemia and arrhythmias.   LV perfusion is normal. There is no evidence of ischemia. There is no evidence of infarction.   Left ventricular function is normal. Nuclear stress EF: 55%. End diastolic cavity size is moderately enlarged. End systolic cavity size is normal.   Findings are consistent with no ischemia and no infarction. The study is low risk.       Echo 02/2023:  1. Left ventricular ejection fraction, by estimation, is 50%. The left  ventricle has low normal function. The left ventricle has no regional wall  motion abnormalities. There is moderate concentric left  ventricular  hypertrophy. Left ventricular diastolic   parameters are consistent with Grade I diastolic dysfunction (impaired  relaxation).   2. Right ventricular systolic function is normal. The right ventricular  size is normal. Tricuspid regurgitation signal is inadequate for assessing  PA pressure.   3. The mitral valve is normal in structure. Trivial mitral valve  regurgitation. No evidence of mitral stenosis.   4. The aortic valve has an indeterminant number of cusps. Aortic valve  regurgitation is not visualized. No aortic stenosis is present.   5. The inferior vena cava is normal in size with greater than 50%  respiratory variability, suggesting right atrial pressure of 3 mmHg.   Comparison(s): No significant change from prior study.  ABI's 11/2019:  Right ABI: 1.43, Left ABI: 1.32  LHC 11/2017: There is moderate left ventricular systolic dysfunction. The left ventricular ejection fraction is 35-45% by visual estimate. LV end diastolic pressure is mildly elevated. ----------------------------------------- ANGIOGRAPHY- PCI LESION #1: Mid LAD lesion is 80% stenosed. A drug-eluting stent was successfully placed using a STENT SYNERGY DES 2.25X16. -Postdilated to 2.4 mm Post intervention, there is a 0% residual stenosis. LESION #2: mid LAD to Dist LAD lesion is 90% stenosed. A drug-eluting stent was successfully placed using a STENT SYNERGY DES 2.25X12. -Postdilated to 2.4 mm Post intervention, there is a 0% residual stenosis. Prox LAD lesion is 40% stenosed -prior to lesion 1; apical, after lesion 2 A drug-eluting stent was successfully placed using a STENT SYNERGY DES 2.25X12. -Postdilated to 2.4 mm ----------------------------------------- Post intervention, there is a 0% residual stenosis. Trifurcation 90% lesion at the distal circumflex into LPL and PDA -small caliber distal vessels. Best treated medically as this is not a very good PCI target Prox small caliber nondominant  RCA lesion is 99% stenosed.   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   Subtotally occluded nondominant RCA Moderately reduced LVEF of roughly 40% with mildly elevated LVEDP.   Plan: Transfer to postprocedure unit for post PCI monitoring and TR removal. Continue aggressive risk factor modification with statin and beta-blocker etc.   Recommend uninterrupted dual antiplatelet therapy with Aspirin  81mg  daily and Ticagrelor  90mg  twice daily for a minimum of 12 months (ACS - Class I recommendation). -Would strongly consider reducing to 60 mg dose ticagrelor  at 9-12 months and continue for an additional year.   Would be okay to stop aspirin  after 3 months if necessary.  Okay to stop Brilinta  after 6 months for procedures if necessary.   Physical Exam:   VS:  BP 122/70 (  BP Location: Left Arm)   Pulse 71   Ht 6' 5 (1.956 m)   Wt 233 lb 6.4 oz (105.9 kg)   SpO2 97%   BMI 27.68 kg/m    Wt Readings from Last 3 Encounters:  03/27/24 233 lb 6.4 oz (105.9 kg)  11/26/23 238 lb 1.6 oz (108 kg)  11/26/23 237 lb 6.4 oz (107.7 kg)    GEN: Well nourished, well developed in no acute distress NECK: No JVD; No carotid bruits CARDIAC: S1/S2, RRR, no murmurs, rubs, gallops RESPIRATORY:  Clear to auscultation without rales, wheezing or rhonchi  ABDOMEN: Soft, non-tender, non-distended EXTREMITIES:  No edema; No deformity   ASSESSMENT AND PLAN: .    CAD Stable with no anginal symptoms. No indication for ischemic evaluation.  Reviewed and discussed recent normal stress test and unremarkable Echo.  Continue current medication regimen. Heart healthy diet and regular cardiovascular exercise encouraged. Care and ED precautions discussed.   HFpEF Stage C, NYHA class I-II symptoms. ICM. Recent Echo showed normal LVEF. Euvolemic and well compensated on exam. Continue  current GDMT. GDMT limited by chronic dizziness - see below. Low sodium diet, fluid restriction <2L, and daily weights encouraged. Educated to contact our office for weight gain of 2 lbs overnight or 5 lbs in one week.  HLD, hypertriglcyeridemia Most recent LDL 128. TC 230, TG 429. Being managed by PCP, he has deferred this to his PCP instead of lipid clinic. Continue current medication regimen. Heart healthy diet and regular cardiovascular exercise encouraged.  He is also following Meagan Younts, NP who is an endocrinology NP. Will request most recent labs.   Hx of CVA's/TIA's carotid artery disease, s/p percutaneous carotid stenting and TCAR, chronic dizziness, leg weakness/leg pains/cramping Seems to have chronic dizziness that is a deficit since his past strokes/TIAs.  Possible vertigo related. No symptoms of orthostatic dizziness. Continue to follow with PCP.  Care and ED precautions discussed.  Continue follow-up with neurosurgery and PCP. Leg cramping/ pains seem to be related to RLS and recent ABI's were normal.   COPD, DOE Denies any worsening symptoms. Most recent cardiac workup reasurring.  Recent PFT showed some evidence of COPD.  Follow-up with pulmonology as scheduled.  Continue follow-up with PCP.  No medication changes at this time.  Care and ED precautions discussed.   Dispo: Follow-up with MD/APP in 6 months or sooner if any changes.  Signed, Almarie Crate, NP   "

## 2024-04-14 ENCOUNTER — Ambulatory Visit: Admitting: Internal Medicine

## 2024-04-14 ENCOUNTER — Other Ambulatory Visit (INDEPENDENT_AMBULATORY_CARE_PROVIDER_SITE_OTHER): Payer: Self-pay | Admitting: Gastroenterology

## 2024-04-14 DIAGNOSIS — J449 Chronic obstructive pulmonary disease, unspecified: Secondary | ICD-10-CM

## 2024-05-13 ENCOUNTER — Ambulatory Visit: Admitting: Internal Medicine
